# Patient Record
Sex: Male | Born: 1970 | Race: Black or African American | Hispanic: No | Smoking: Former smoker
Health system: Southern US, Community
[De-identification: ages and names within clinical notes are randomized; demographics above are authoritative.]

## PROBLEM LIST (undated history)

## (undated) DIAGNOSIS — G4730 Sleep apnea, unspecified: Secondary | ICD-10-CM

## (undated) DIAGNOSIS — Z860100 Personal history of colon polyps, unspecified: Secondary | ICD-10-CM

## (undated) DIAGNOSIS — K7689 Other specified diseases of liver: Secondary | ICD-10-CM

## (undated) DIAGNOSIS — T7840XA Allergy, unspecified, initial encounter: Secondary | ICD-10-CM

## (undated) DIAGNOSIS — E785 Hyperlipidemia, unspecified: Secondary | ICD-10-CM

## (undated) DIAGNOSIS — D649 Anemia, unspecified: Secondary | ICD-10-CM

## (undated) DIAGNOSIS — C801 Malignant (primary) neoplasm, unspecified: Secondary | ICD-10-CM

## (undated) DIAGNOSIS — K625 Hemorrhage of anus and rectum: Secondary | ICD-10-CM

## (undated) DIAGNOSIS — K219 Gastro-esophageal reflux disease without esophagitis: Secondary | ICD-10-CM

## (undated) DIAGNOSIS — Z9289 Personal history of other medical treatment: Secondary | ICD-10-CM

## (undated) DIAGNOSIS — G2581 Restless legs syndrome: Secondary | ICD-10-CM

## (undated) DIAGNOSIS — R7303 Prediabetes: Secondary | ICD-10-CM

## (undated) DIAGNOSIS — K449 Diaphragmatic hernia without obstruction or gangrene: Secondary | ICD-10-CM

## (undated) DIAGNOSIS — F419 Anxiety disorder, unspecified: Secondary | ICD-10-CM

## (undated) DIAGNOSIS — R0789 Other chest pain: Secondary | ICD-10-CM

## (undated) DIAGNOSIS — Z720 Tobacco use: Secondary | ICD-10-CM

## (undated) DIAGNOSIS — I10 Essential (primary) hypertension: Secondary | ICD-10-CM

## (undated) DIAGNOSIS — C9000 Multiple myeloma not having achieved remission: Secondary | ICD-10-CM

## (undated) DIAGNOSIS — F32A Depression, unspecified: Secondary | ICD-10-CM

## (undated) HISTORY — DX: Hemorrhage of anus and rectum: K62.5

## (undated) HISTORY — DX: Gastro-esophageal reflux disease without esophagitis: K21.9

## (undated) HISTORY — DX: Other chest pain: R07.89

## (undated) HISTORY — DX: Other specified diseases of liver: K76.89

## (undated) HISTORY — DX: Hyperlipidemia, unspecified: E78.5

## (undated) HISTORY — DX: Personal history of colon polyps, unspecified: Z86.0100

## (undated) HISTORY — DX: Personal history of colonic polyps: Z86.010

## (undated) HISTORY — DX: Allergy, unspecified, initial encounter: T78.40XA

## (undated) HISTORY — DX: Tobacco use: Z72.0

---

## 2001-01-17 ENCOUNTER — Emergency Department (HOSPITAL_COMMUNITY): Admission: EM | Admit: 2001-01-17 | Discharge: 2001-01-17 | Payer: Self-pay | Admitting: Emergency Medicine

## 2007-02-06 ENCOUNTER — Emergency Department (HOSPITAL_COMMUNITY): Admission: EM | Admit: 2007-02-06 | Discharge: 2007-02-06 | Payer: Self-pay | Admitting: Emergency Medicine

## 2007-07-21 ENCOUNTER — Emergency Department (HOSPITAL_COMMUNITY): Admission: EM | Admit: 2007-07-21 | Discharge: 2007-07-21 | Payer: Self-pay | Admitting: Emergency Medicine

## 2007-08-24 ENCOUNTER — Emergency Department (HOSPITAL_COMMUNITY): Admission: EM | Admit: 2007-08-24 | Discharge: 2007-08-24 | Payer: Self-pay | Admitting: Emergency Medicine

## 2007-09-16 ENCOUNTER — Inpatient Hospital Stay (HOSPITAL_COMMUNITY): Admission: EM | Admit: 2007-09-16 | Discharge: 2007-09-17 | Payer: Self-pay | Admitting: Emergency Medicine

## 2007-09-16 ENCOUNTER — Ambulatory Visit: Payer: Self-pay | Admitting: Internal Medicine

## 2007-09-17 ENCOUNTER — Ambulatory Visit: Payer: Self-pay | Admitting: Internal Medicine

## 2007-09-20 ENCOUNTER — Emergency Department (HOSPITAL_COMMUNITY): Admission: EM | Admit: 2007-09-20 | Discharge: 2007-09-20 | Payer: Self-pay | Admitting: Emergency Medicine

## 2007-09-28 ENCOUNTER — Emergency Department (HOSPITAL_COMMUNITY): Admission: EM | Admit: 2007-09-28 | Discharge: 2007-09-28 | Payer: Self-pay | Admitting: Emergency Medicine

## 2007-09-30 ENCOUNTER — Ambulatory Visit (HOSPITAL_COMMUNITY): Admission: RE | Admit: 2007-09-30 | Discharge: 2007-09-30 | Payer: Self-pay | Admitting: Internal Medicine

## 2007-09-30 ENCOUNTER — Encounter: Payer: Self-pay | Admitting: Internal Medicine

## 2007-09-30 ENCOUNTER — Ambulatory Visit: Payer: Self-pay | Admitting: Cardiology

## 2007-10-04 ENCOUNTER — Ambulatory Visit (HOSPITAL_COMMUNITY): Admission: RE | Admit: 2007-10-04 | Discharge: 2007-10-04 | Payer: Self-pay | Admitting: Internal Medicine

## 2007-10-04 ENCOUNTER — Ambulatory Visit: Payer: Self-pay | Admitting: Internal Medicine

## 2007-10-04 HISTORY — PX: ESOPHAGOGASTRODUODENOSCOPY: SHX1529

## 2007-11-01 ENCOUNTER — Ambulatory Visit: Payer: Self-pay | Admitting: Internal Medicine

## 2007-12-19 ENCOUNTER — Emergency Department (HOSPITAL_COMMUNITY): Admission: EM | Admit: 2007-12-19 | Discharge: 2007-12-19 | Payer: Self-pay | Admitting: Emergency Medicine

## 2008-03-23 ENCOUNTER — Emergency Department (HOSPITAL_COMMUNITY): Admission: EM | Admit: 2008-03-23 | Discharge: 2008-03-23 | Payer: Self-pay | Admitting: Emergency Medicine

## 2008-06-09 ENCOUNTER — Emergency Department (HOSPITAL_COMMUNITY): Admission: EM | Admit: 2008-06-09 | Discharge: 2008-06-09 | Payer: Self-pay | Admitting: Emergency Medicine

## 2008-06-22 ENCOUNTER — Observation Stay (HOSPITAL_COMMUNITY): Admission: EM | Admit: 2008-06-22 | Discharge: 2008-06-23 | Payer: Self-pay | Admitting: Emergency Medicine

## 2008-06-23 ENCOUNTER — Ambulatory Visit: Payer: Self-pay | Admitting: Internal Medicine

## 2008-07-20 ENCOUNTER — Ambulatory Visit: Payer: Self-pay | Admitting: Internal Medicine

## 2008-07-25 ENCOUNTER — Ambulatory Visit (HOSPITAL_COMMUNITY): Admission: RE | Admit: 2008-07-25 | Discharge: 2008-07-25 | Payer: Self-pay | Admitting: Internal Medicine

## 2008-07-28 ENCOUNTER — Encounter: Payer: Self-pay | Admitting: Internal Medicine

## 2008-08-02 ENCOUNTER — Emergency Department (HOSPITAL_COMMUNITY): Admission: EM | Admit: 2008-08-02 | Discharge: 2008-08-02 | Payer: Self-pay | Admitting: Emergency Medicine

## 2008-08-23 ENCOUNTER — Telehealth (INDEPENDENT_AMBULATORY_CARE_PROVIDER_SITE_OTHER): Payer: Self-pay

## 2008-09-06 DIAGNOSIS — Z720 Tobacco use: Secondary | ICD-10-CM | POA: Insufficient documentation

## 2008-09-06 DIAGNOSIS — F1010 Alcohol abuse, uncomplicated: Secondary | ICD-10-CM | POA: Insufficient documentation

## 2008-09-06 DIAGNOSIS — K5909 Other constipation: Secondary | ICD-10-CM | POA: Insufficient documentation

## 2008-09-06 DIAGNOSIS — R1310 Dysphagia, unspecified: Secondary | ICD-10-CM | POA: Insufficient documentation

## 2008-09-06 DIAGNOSIS — F17200 Nicotine dependence, unspecified, uncomplicated: Secondary | ICD-10-CM | POA: Insufficient documentation

## 2008-09-06 DIAGNOSIS — K219 Gastro-esophageal reflux disease without esophagitis: Secondary | ICD-10-CM | POA: Insufficient documentation

## 2008-09-06 DIAGNOSIS — I10 Essential (primary) hypertension: Secondary | ICD-10-CM | POA: Insufficient documentation

## 2008-09-25 ENCOUNTER — Emergency Department (HOSPITAL_COMMUNITY): Admission: EM | Admit: 2008-09-25 | Discharge: 2008-09-25 | Payer: Self-pay | Admitting: Emergency Medicine

## 2008-10-13 ENCOUNTER — Telehealth (INDEPENDENT_AMBULATORY_CARE_PROVIDER_SITE_OTHER): Payer: Self-pay | Admitting: *Deleted

## 2008-11-03 ENCOUNTER — Telehealth (INDEPENDENT_AMBULATORY_CARE_PROVIDER_SITE_OTHER): Payer: Self-pay

## 2009-01-02 ENCOUNTER — Ambulatory Visit: Payer: Self-pay | Admitting: Internal Medicine

## 2009-01-10 ENCOUNTER — Ambulatory Visit: Payer: Self-pay | Admitting: Internal Medicine

## 2009-01-10 ENCOUNTER — Ambulatory Visit (HOSPITAL_COMMUNITY): Admission: RE | Admit: 2009-01-10 | Discharge: 2009-01-10 | Payer: Self-pay | Admitting: Internal Medicine

## 2009-01-10 HISTORY — PX: COLONOSCOPY: SHX174

## 2009-01-22 ENCOUNTER — Telehealth (INDEPENDENT_AMBULATORY_CARE_PROVIDER_SITE_OTHER): Payer: Self-pay

## 2009-03-06 ENCOUNTER — Emergency Department (HOSPITAL_COMMUNITY): Admission: EM | Admit: 2009-03-06 | Discharge: 2009-03-06 | Payer: Self-pay | Admitting: Emergency Medicine

## 2009-05-31 ENCOUNTER — Telehealth (INDEPENDENT_AMBULATORY_CARE_PROVIDER_SITE_OTHER): Payer: Self-pay | Admitting: *Deleted

## 2009-06-18 ENCOUNTER — Emergency Department (HOSPITAL_COMMUNITY): Admission: EM | Admit: 2009-06-18 | Discharge: 2009-06-18 | Payer: Self-pay | Admitting: Emergency Medicine

## 2009-07-18 ENCOUNTER — Telehealth (INDEPENDENT_AMBULATORY_CARE_PROVIDER_SITE_OTHER): Payer: Self-pay | Admitting: *Deleted

## 2009-08-28 ENCOUNTER — Telehealth (INDEPENDENT_AMBULATORY_CARE_PROVIDER_SITE_OTHER): Payer: Self-pay

## 2009-09-18 ENCOUNTER — Emergency Department (HOSPITAL_COMMUNITY): Admission: EM | Admit: 2009-09-18 | Discharge: 2009-09-18 | Payer: Self-pay | Admitting: Emergency Medicine

## 2009-10-09 ENCOUNTER — Telehealth (INDEPENDENT_AMBULATORY_CARE_PROVIDER_SITE_OTHER): Payer: Self-pay

## 2010-06-25 NOTE — Progress Notes (Signed)
Summary: samples of Nexium  Phone Note Call from Patient   Reason for Call: Refill Medication Summary of Call: Pt came by office to see if he could get Nexium samples. He was told last week that we were out and to check back later. I gave him 5 boxes of 5.  Initial call taken by: Diana Eves,  July 18, 2009 10:35 AM

## 2010-06-25 NOTE — Progress Notes (Signed)
Summary: Nexium samples  Phone Note Call from Patient   Caller: Patient Summary of Call: Pt requested samples of Nexium. # 30 put at front for the pt.   Initial call taken by: Cloria Spring LPN,  August 28, 2009 2:31 PM

## 2010-06-25 NOTE — Progress Notes (Signed)
Summary: samples  Phone Note Call from Patient   Reason for Call: Refill Medication Summary of Call: Pt called to see if he could get Nexium samples. Samule Dry for me to give 6 boxes of 5 of Nexium 40mg . Initial call taken by: Diana Eves,  May 31, 2009 3:40 PM

## 2010-06-25 NOTE — Progress Notes (Signed)
Summary: nexium samples  Phone Note Call from Patient Call back at [facility] Phone [phone]   Caller: Patient Summary of Call: pt called requesting samples of nexium. #15 given Initial call taken by: Hendricks Limes LPN,  Oct 09, 2009 9:13 AM

## 2010-08-10 ENCOUNTER — Emergency Department (HOSPITAL_COMMUNITY): Payer: Self-pay

## 2010-08-10 ENCOUNTER — Emergency Department (HOSPITAL_COMMUNITY)
Admission: EM | Admit: 2010-08-10 | Discharge: 2010-08-10 | Disposition: A | Discharge status: Home / Self Care | Payer: Self-pay | Attending: Emergency Medicine | Admitting: Emergency Medicine

## 2010-08-10 DIAGNOSIS — R059 Cough, unspecified: Secondary | ICD-10-CM | POA: Insufficient documentation

## 2010-08-10 DIAGNOSIS — I10 Essential (primary) hypertension: Secondary | ICD-10-CM | POA: Insufficient documentation

## 2010-08-10 DIAGNOSIS — K219 Gastro-esophageal reflux disease without esophagitis: Secondary | ICD-10-CM | POA: Insufficient documentation

## 2010-08-10 DIAGNOSIS — R05 Cough: Secondary | ICD-10-CM | POA: Insufficient documentation

## 2010-08-10 DIAGNOSIS — R51 Headache: Secondary | ICD-10-CM | POA: Insufficient documentation

## 2010-08-10 DIAGNOSIS — J329 Chronic sinusitis, unspecified: Secondary | ICD-10-CM | POA: Insufficient documentation

## 2010-08-10 DIAGNOSIS — R42 Dizziness and giddiness: Secondary | ICD-10-CM | POA: Insufficient documentation

## 2010-08-10 LAB — DIFFERENTIAL
Basophils Absolute: 0 10*3/uL (ref 0.0–0.1)
Basophils Relative: 0 % (ref 0–1)
Eosinophils Absolute: 0.1 10*3/uL (ref 0.0–0.7)
Eosinophils Relative: 1 % (ref 0–5)
Lymphocytes Relative: 38 % (ref 12–46)
Lymphs Abs: 3.3 10*3/uL (ref 0.7–4.0)
Monocytes Absolute: 0.4 10*3/uL (ref 0.1–1.0)
Monocytes Relative: 5 % (ref 3–12)
Neutro Abs: 4.8 10*3/uL (ref 1.7–7.7)
Neutrophils Relative %: 56 % (ref 43–77)

## 2010-08-10 LAB — BASIC METABOLIC PANEL
BUN: 16 mg/dL (ref 6–23)
CO2: 24 mEq/L (ref 19–32)
Calcium: 9 mg/dL (ref 8.4–10.5)
Chloride: 102 mEq/L (ref 96–112)
Creatinine, Ser: 1.03 mg/dL (ref 0.4–1.5)
GFR calc Af Amer: >60 mL/min (ref >60)
GFR calc non Af Amer: >60 mL/min (ref >60)
Glucose, Bld: 100 mg/dL — ABNORMAL HIGH (ref 70–99)
Potassium: 4.4 mEq/L (ref 3.5–5.1)
Sodium: 134 mEq/L — ABNORMAL LOW (ref 135–145)

## 2010-08-10 LAB — CBC
HCT: 45.5 % (ref 39.0–52.0)
Hemoglobin: 15.2 g/dL (ref 13.0–17.0)
MCH: 28.3 pg (ref 26.0–34.0)
MCHC: 33.4 g/dL (ref 30.0–36.0)
MCV: 84.6 fL (ref 78.0–100.0)
Platelets: 265 10*3/uL (ref 150–400)
RBC: 5.38 MIL/uL (ref 4.22–5.81)
RDW: 13.1 % (ref 11.5–15.5)
WBC: 8.6 10*3/uL (ref 4.0–10.5)

## 2010-08-11 LAB — RAPID STREP SCREEN (MED CTR MEBANE ONLY): Streptococcus, Group A Screen (Direct): NEGATIVE

## 2010-08-13 LAB — CBC
HCT: 41.9 % (ref 39.0–52.0)
Hemoglobin: 14.7 g/dL (ref 13.0–17.0)
MCHC: 35.1 g/dL (ref 30.0–36.0)
MCV: 81.8 fL (ref 78.0–100.0)
Platelets: 267 10*3/uL (ref 150–400)
RBC: 5.12 MIL/uL (ref 4.22–5.81)
RDW: 13 % (ref 11.5–15.5)
WBC: 8 10*3/uL (ref 4.0–10.5)

## 2010-08-13 LAB — POCT CARDIAC MARKERS
CKMB, poc: 2.4 ng/mL (ref 1.0–8.0)
Myoglobin, poc: 94.6 ng/mL (ref 12–200)
Troponin i, poc: <0.05 ng/mL (ref 0.00–0.09)

## 2010-08-13 LAB — DIFFERENTIAL
Basophils Absolute: 0 10*3/uL (ref 0.0–0.1)
Basophils Relative: 1 % (ref 0–1)
Eosinophils Absolute: 0.1 10*3/uL (ref 0.0–0.7)
Eosinophils Relative: 1 % (ref 0–5)
Lymphocytes Relative: 39 % (ref 12–46)
Lymphs Abs: 3.1 10*3/uL (ref 0.7–4.0)
Monocytes Absolute: 0.5 10*3/uL (ref 0.1–1.0)
Monocytes Relative: 6 % (ref 3–12)
Neutro Abs: 4.3 10*3/uL (ref 1.7–7.7)
Neutrophils Relative %: 54 % (ref 43–77)

## 2010-08-13 LAB — BASIC METABOLIC PANEL
BUN: 20 mg/dL (ref 6–23)
CO2: 25 mEq/L (ref 19–32)
Calcium: 9.6 mg/dL (ref 8.4–10.5)
Chloride: 103 mEq/L (ref 96–112)
Creatinine, Ser: 1.23 mg/dL (ref 0.4–1.5)
GFR calc Af Amer: >60 mL/min (ref >60)
GFR calc non Af Amer: >60 mL/min (ref >60)
Glucose, Bld: 106 mg/dL — ABNORMAL HIGH (ref 70–99)
Potassium: 4.2 mEq/L (ref 3.5–5.1)
Sodium: 135 mEq/L (ref 135–145)

## 2010-08-29 LAB — COMPREHENSIVE METABOLIC PANEL
ALT: 24 U/L (ref 0–53)
AST: 21 U/L (ref 0–37)
Albumin: 4.2 g/dL (ref 3.5–5.2)
Alkaline Phosphatase: 62 U/L (ref 39–117)
BUN: 21 mg/dL (ref 6–23)
CO2: 24 mEq/L (ref 19–32)
Calcium: 9.3 mg/dL (ref 8.4–10.5)
Chloride: 105 mEq/L (ref 96–112)
Creatinine, Ser: 1.46 mg/dL (ref 0.4–1.5)
GFR calc Af Amer: >60 mL/min (ref >60)
GFR calc non Af Amer: 54 mL/min — ABNORMAL LOW (ref >60)
Glucose, Bld: 111 mg/dL — ABNORMAL HIGH (ref 70–99)
Potassium: 4.1 mEq/L (ref 3.5–5.1)
Sodium: 137 mEq/L (ref 135–145)
Total Bilirubin: 0.7 mg/dL (ref 0.3–1.2)
Total Protein: 7.6 g/dL (ref 6.0–8.3)

## 2010-08-29 LAB — DIFFERENTIAL
Basophils Absolute: 0.1 10*3/uL (ref 0.0–0.1)
Basophils Relative: 1 % (ref 0–1)
Eosinophils Absolute: 0.1 10*3/uL (ref 0.0–0.7)
Eosinophils Relative: 2 % (ref 0–5)
Lymphocytes Relative: 23 % (ref 12–46)
Lymphs Abs: 2.2 10*3/uL (ref 0.7–4.0)
Monocytes Absolute: 0.5 10*3/uL (ref 0.1–1.0)
Monocytes Relative: 5 % (ref 3–12)
Neutro Abs: 6.4 10*3/uL (ref 1.7–7.7)
Neutrophils Relative %: 69 % (ref 43–77)

## 2010-08-29 LAB — POCT CARDIAC MARKERS
CKMB, poc: 1.7 ng/mL (ref 1.0–8.0)
Myoglobin, poc: 80.4 ng/mL (ref 12–200)
Troponin i, poc: <0.05 ng/mL (ref 0.00–0.09)

## 2010-08-29 LAB — CBC
HCT: 46.3 % (ref 39.0–52.0)
Hemoglobin: 15.9 g/dL (ref 13.0–17.0)
MCHC: 34.3 g/dL (ref 30.0–36.0)
MCV: 85.1 fL (ref 78.0–100.0)
Platelets: 269 10*3/uL (ref 150–400)
RBC: 5.44 MIL/uL (ref 4.22–5.81)
RDW: 12.9 % (ref 11.5–15.5)
WBC: 9.3 10*3/uL (ref 4.0–10.5)

## 2010-09-09 LAB — DIFFERENTIAL
Basophils Absolute: 0 10*3/uL (ref 0.0–0.1)
Basophils Absolute: 0.1 10*3/uL (ref 0.0–0.1)
Basophils Relative: 0 % (ref 0–1)
Basophils Relative: 1 % (ref 0–1)
Eosinophils Absolute: 0.1 10*3/uL (ref 0.0–0.7)
Eosinophils Absolute: 0.2 10*3/uL (ref 0.0–0.7)
Eosinophils Relative: 2 % (ref 0–5)
Eosinophils Relative: 2 % (ref 0–5)
Lymphocytes Relative: 32 % (ref 12–46)
Lymphocytes Relative: 40 % (ref 12–46)
Lymphs Abs: 2.8 10*3/uL (ref 0.7–4.0)
Lymphs Abs: 3.1 10*3/uL (ref 0.7–4.0)
Monocytes Absolute: 0.5 10*3/uL (ref 0.1–1.0)
Monocytes Absolute: 0.6 10*3/uL (ref 0.1–1.0)
Monocytes Relative: 6 % (ref 3–12)
Monocytes Relative: 7 % (ref 3–12)
Neutro Abs: 4.1 10*3/uL (ref 1.7–7.7)
Neutro Abs: 5.2 10*3/uL (ref 1.7–7.7)
Neutrophils Relative %: 51 % (ref 43–77)
Neutrophils Relative %: 59 % (ref 43–77)

## 2010-09-09 LAB — CBC
HCT: 43.6 % (ref 39.0–52.0)
HCT: 45.9 % (ref 39.0–52.0)
Hemoglobin: 14.6 g/dL (ref 13.0–17.0)
Hemoglobin: 15.9 g/dL (ref 13.0–17.0)
MCHC: 33.5 g/dL (ref 30.0–36.0)
MCHC: 34.6 g/dL (ref 30.0–36.0)
MCV: 85.1 fL (ref 78.0–100.0)
MCV: 85.8 fL (ref 78.0–100.0)
Platelets: 276 10*3/uL (ref 150–400)
Platelets: 291 10*3/uL (ref 150–400)
RBC: 5.08 MIL/uL (ref 4.22–5.81)
RBC: 5.4 MIL/uL (ref 4.22–5.81)
RDW: 12.6 % (ref 11.5–15.5)
RDW: 12.6 % (ref 11.5–15.5)
WBC: 8 10*3/uL (ref 4.0–10.5)
WBC: 8.8 10*3/uL (ref 4.0–10.5)

## 2010-09-09 LAB — CARDIAC PANEL(CRET KIN+CKTOT+MB+TROPI)
CK, MB: 1.3 ng/mL (ref 0.3–4.0)
CK, MB: 1.3 ng/mL (ref 0.3–4.0)
Relative Index: 0.8 (ref 0.0–2.5)
Relative Index: 0.8 (ref 0.0–2.5)
Total CK: 155 U/L (ref 7–232)
Total CK: 165 U/L (ref 7–232)
Troponin I: 0.02 ng/mL (ref 0.00–0.06)
Troponin I: <0.01 ng/mL (ref 0.00–0.06)

## 2010-09-09 LAB — BASIC METABOLIC PANEL
BUN: 12 mg/dL (ref 6–23)
BUN: 9 mg/dL (ref 6–23)
CO2: 26 mEq/L (ref 19–32)
CO2: 29 mEq/L (ref 19–32)
Calcium: 9.2 mg/dL (ref 8.4–10.5)
Calcium: 9.5 mg/dL (ref 8.4–10.5)
Chloride: 101 mEq/L (ref 96–112)
Chloride: 99 mEq/L (ref 96–112)
Creatinine, Ser: 1.08 mg/dL (ref 0.4–1.5)
Creatinine, Ser: 1.23 mg/dL (ref 0.4–1.5)
GFR calc Af Amer: >60 mL/min (ref >60)
GFR calc Af Amer: >60 mL/min (ref >60)
GFR calc non Af Amer: >60 mL/min (ref >60)
GFR calc non Af Amer: >60 mL/min (ref >60)
Glucose, Bld: 103 mg/dL — ABNORMAL HIGH (ref 70–99)
Glucose, Bld: 110 mg/dL — ABNORMAL HIGH (ref 70–99)
Potassium: 3.9 mEq/L (ref 3.5–5.1)
Potassium: 4 mEq/L (ref 3.5–5.1)
Sodium: 136 mEq/L (ref 135–145)
Sodium: 136 mEq/L (ref 135–145)

## 2010-09-09 LAB — LIPID PANEL
Cholesterol: 220 mg/dL — ABNORMAL HIGH (ref 0–200)
HDL: 26 mg/dL — ABNORMAL LOW (ref >39)
LDL Cholesterol: 163 mg/dL — ABNORMAL HIGH (ref 0–99)
Total CHOL/HDL Ratio: 8.5 RATIO
Triglycerides: 155 mg/dL — ABNORMAL HIGH (ref <150)
VLDL: 31 mg/dL (ref 0–40)

## 2010-09-09 LAB — HEPATIC FUNCTION PANEL
ALT: 19 U/L (ref 0–53)
AST: 19 U/L (ref 0–37)
Albumin: 3.6 g/dL (ref 3.5–5.2)
Alkaline Phosphatase: 57 U/L (ref 39–117)
Bilirubin, Direct: 0.1 mg/dL (ref 0.0–0.3)
Indirect Bilirubin: 0.6 mg/dL (ref 0.3–0.9)
Total Bilirubin: 0.7 mg/dL (ref 0.3–1.2)
Total Protein: 6.8 g/dL (ref 6.0–8.3)

## 2010-09-09 LAB — POCT CARDIAC MARKERS
CKMB, poc: 1.1 ng/mL (ref 1.0–8.0)
Myoglobin, poc: 59.2 ng/mL (ref 12–200)
Troponin i, poc: <0.05 ng/mL (ref 0.00–0.09)

## 2010-09-09 LAB — TSH: TSH: 2.559 u[IU]/mL (ref 0.350–4.500)

## 2010-09-09 LAB — LIPASE, BLOOD: Lipase: 30 U/L (ref 11–59)

## 2010-09-28 ENCOUNTER — Emergency Department (HOSPITAL_COMMUNITY)
Admission: EM | Admit: 2010-09-28 | Discharge: 2010-09-28 | Disposition: A | Discharge status: Home / Self Care | Payer: Self-pay | Attending: Emergency Medicine | Admitting: Emergency Medicine

## 2010-09-28 DIAGNOSIS — R070 Pain in throat: Secondary | ICD-10-CM | POA: Insufficient documentation

## 2010-09-28 DIAGNOSIS — J069 Acute upper respiratory infection, unspecified: Secondary | ICD-10-CM | POA: Insufficient documentation

## 2010-09-28 DIAGNOSIS — K449 Diaphragmatic hernia without obstruction or gangrene: Secondary | ICD-10-CM | POA: Insufficient documentation

## 2010-09-28 DIAGNOSIS — K219 Gastro-esophageal reflux disease without esophagitis: Secondary | ICD-10-CM | POA: Insufficient documentation

## 2010-09-28 DIAGNOSIS — I10 Essential (primary) hypertension: Secondary | ICD-10-CM | POA: Insufficient documentation

## 2010-09-28 DIAGNOSIS — R51 Headache: Secondary | ICD-10-CM | POA: Insufficient documentation

## 2010-09-30 LAB — GC/CHLAMYDIA PROBE AMP, GENITAL
Chlamydia, DNA Probe: NEGATIVE
GC Probe Amp, Genital: NEGATIVE

## 2010-10-02 ENCOUNTER — Emergency Department (HOSPITAL_COMMUNITY): Payer: Self-pay

## 2010-10-02 ENCOUNTER — Emergency Department (HOSPITAL_COMMUNITY)
Admission: EM | Admit: 2010-10-02 | Discharge: 2010-10-02 | Disposition: A | Discharge status: Home / Self Care | Payer: Self-pay | Attending: Emergency Medicine | Admitting: Emergency Medicine

## 2010-10-02 DIAGNOSIS — K219 Gastro-esophageal reflux disease without esophagitis: Secondary | ICD-10-CM | POA: Insufficient documentation

## 2010-10-02 DIAGNOSIS — J40 Bronchitis, not specified as acute or chronic: Secondary | ICD-10-CM | POA: Insufficient documentation

## 2010-10-02 DIAGNOSIS — I10 Essential (primary) hypertension: Secondary | ICD-10-CM | POA: Insufficient documentation

## 2010-10-02 DIAGNOSIS — K449 Diaphragmatic hernia without obstruction or gangrene: Secondary | ICD-10-CM | POA: Insufficient documentation

## 2010-10-02 LAB — CBC
HCT: 45 % (ref 39.0–52.0)
Hemoglobin: 14.4 g/dL (ref 13.0–17.0)
MCH: 27.6 pg (ref 26.0–34.0)
MCHC: 32 g/dL (ref 30.0–36.0)
MCV: 86.2 fL (ref 78.0–100.0)
Platelets: 219 10*3/uL (ref 150–400)
RBC: 5.22 MIL/uL (ref 4.22–5.81)
RDW: 13 % (ref 11.5–15.5)
WBC: 10.6 10*3/uL — ABNORMAL HIGH (ref 4.0–10.5)

## 2010-10-02 LAB — DIFFERENTIAL
Band Neutrophils: 0 % (ref 0–10)
Basophils Absolute: 0 10*3/uL (ref 0.0–0.1)
Basophils Relative: 0 % (ref 0–1)
Blasts: 0 %
Eosinophils Absolute: 0 10*3/uL (ref 0.0–0.7)
Eosinophils Relative: 0 % (ref 0–5)
Lymphocytes Relative: 40 % (ref 12–46)
Lymphs Abs: 4.2 10*3/uL — ABNORMAL HIGH (ref 0.7–4.0)
Metamyelocytes Relative: 0 %
Monocytes Absolute: 0.5 10*3/uL (ref 0.1–1.0)
Monocytes Relative: 5 % (ref 3–12)
Myelocytes: 0 %
Neutro Abs: 5.9 10*3/uL (ref 1.7–7.7)
Neutrophils Relative %: 55 % (ref 43–77)
Promyelocytes Absolute: 0 %
nRBC: 0 /100 WBC

## 2010-10-02 LAB — BASIC METABOLIC PANEL
BUN: 21 mg/dL (ref 6–23)
CO2: 24 mEq/L (ref 19–32)
Calcium: 9.8 mg/dL (ref 8.4–10.5)
Chloride: 103 mEq/L (ref 96–112)
Creatinine, Ser: 1.19 mg/dL (ref 0.4–1.5)
GFR calc Af Amer: >60 mL/min (ref >60)
GFR calc non Af Amer: >60 mL/min (ref >60)
Glucose, Bld: 95 mg/dL (ref 70–99)
Potassium: 4.3 mEq/L (ref 3.5–5.1)
Sodium: 136 mEq/L (ref 135–145)

## 2010-10-02 LAB — POCT CARDIAC MARKERS
CKMB, poc: 4.3 ng/mL (ref 1.0–8.0)
Myoglobin, poc: 169 ng/mL (ref 12–200)
Troponin i, poc: <0.05 ng/mL (ref 0.00–0.09)

## 2010-10-08 NOTE — Discharge Summary (Signed)
[name], [name]               ACCOUNT NO.:  [id number]   MEDICAL RECORD NO.:  [id number]          PATIENT TYPE:  OBV   LOCATION:  A308                          FACILITY:  [facility]   PHYSICIAN:  Osvaldo Shipper, MD     DATE OF BIRTH:  [birth year]   DATE OF ADMISSION:  06/22/2008  DATE OF DISCHARGE:  01/29/2010LH                               DISCHARGE SUMMARY   Please review H and P dictated by Dr. Lilian Kapur for details regarding the  patient's presenting illness.   The patient is planning to follow up with Dr. Renard Matter.  He currently is  [facility] by [facility].   DISCHARGE DIAGNOSES:  1. Gastroesophageal reflux disease.  2. Hypertension.   BRIEF HOSPITAL COURSE:  Briefly, this is a 40 year old African American  male who presented to the ED with complaints of chest pain.  It was  found that he actually had epigastric pain.  The patient has been worked  up for chest pain in the past with a negative stress echo in May 2009.  The patient subsequently was followed up by Gastroenterology, and he  underwent EGD which showed evidence for reflux esophagitis.  The patient  was asked to be on Protonix which she obviously is not taking at this  time.  The patient was seen by GI today and they have arranged for him  to get samples at their office.  He is feeling better.  He has tolerated  his breakfast.  His pain is improved.  We are awaiting LFTs and lipase  level to come back from the lab, and if they are normal he can go home.  He will continue with his antihypertensive agents.  He will follow up  with Dr. Renard Matter and with GI in 2 weeks.   DISCHARGE MEDICATIONS:  Lisinopril/hydrochlorothiazide 10/12.5 one  tablet daily.  He is on penicillin VK for a dental procedure which he  can continue taking as before and he will be on Nexium 20 mg daily.   Follow up with GI in 2 weeks.   TOTAL TIME OF THIS DISCHARGE ENCOUNTER:  Less than 30 minutes.   Imaging studies include a chest  x-ray which was negative.      Osvaldo Shipper, MD  Electronically Signed     GK/MEDQ  D:  06/23/2008  T:  06/23/2008  Job:  [id number]   cc:   R. Roetta Sessions, M.D.  [address]  [place]  [place] [address]

## 2010-10-08 NOTE — Assessment & Plan Note (Signed)
NAMEKEVONTE, VANECEK                CHART#:  [id number]   DATE:  11/01/2007                       DOB:  05-08-1971   CHIEF COMPLAINT:  1. Follow up for erosive reflux esophagitis/bulbar erosions on      esophagogastroduodenoscopy by Dr. Jena Gauss, Oct 04, 2007.  2. Status post Helicobacter pylori treatment for positive serum      titers.  3. Atypical chest pain and nausea secondary to erosive reflux      esophagitis/bulbar erosions.  4. Obesity.   SUBJECTIVE:  Mr. Nofziger is a 40 year old African American male who is  status post H. pylori treatment with Prevpac with history of positive  serology and EGD which showed erosive reflux esophagitis and hiatal  hernia and bulbar erosions.  He was also dilated with 56-French Bartholome Bill.  He has completed his Prevpac course.  He continues to have  transient nausea and some heartburn, indigestion associated with certain  foods, but has been off PPI since he completed the Prevpac.  Unfortunately, he misunderstood our directions to continue this once he  was done with the Prevpac.  He is having normal, soft, brown bowel  movement.  He denies any abdominal pain at this time.  His appetite is  good.   CURRENT MEDICATIONS:  See the list from November 01, 2007.   ALLERGIES:  No known drug allergies.   OBJECTIVE:  VITAL SIGNS:  Weight 240 pounds, height 69 inches, temp  98.8, blood pressure 130/90, and pulse 88.  GENERAL:  Mr. Maltese is an obese African American male who is alert,  oriented, pleasant, cooperative, and in no acute distress.  HEENT:  Sclerae clear.  Nonicteric.  Conjunctivae pink.  Oropharynx pink  and moist without any lesions.  CHEST:  Heart is regular rate and rhythm.  Normal S1 and S2.  ABDOMEN:  Protuberant.  Positive bowel sounds x4.  No bruits  auscultated.  Soft, nontender, and nondistended without mass or  hepatosplenomegaly.  No rebound  tenderness or guarding.  EXTREMITIES:  Without clubbing or edema bilaterally.   ASSESSMENT:  Erosive reflux esophagitis/bulbar duodenitis.  The patient  has been off of PPI and is symptomatic.  Unfortunately, he misunderstood  instructions as far as resuming his medication after Prevpac treatment  for H. pylori.   He also has occasional episodes of constipation for which he uses  MiraLax at home with good relief.   PLAN:  1. Continue MiraLax 17 g daily p.r.n.  2. Protonix 40 mg b.i.d. for 1 more month, then can decrease to 40 mg      daily, #60 with 10 refills.  3. Office visit in 1 year or sooner with Dr. Jena Gauss.       Lorenza Burton, N.P.  Electronically Signed     R. Roetta Sessions, M.D.  Electronically Signed    KJ/MEDQ  D:  11/01/2007  T:  11/02/2007  Job:  [id number]

## 2010-10-08 NOTE — Procedures (Signed)
NAMECEASER, EBELING NO.:  [id number]   MEDICAL RECORD NO.:  [id number]          PATIENT TYPE:  OUT   LOCATION:  RAD                           FACILITY:  [facility]   PHYSICIAN:  Gerrit Friends. Dietrich Pates, MD, FACCDATE OF BIRTH:  1970/08/08   DATE OF PROCEDURE:  DATE OF DISCHARGE:  09/30/2007                                ECHOCARDIOGRAM   REFERRING PHYSICIAN:  Angus G. Renard Matter, MD   CLINICAL DATA:  A 40 year old woman with chest pain.  1. Treadmill exercise performed to a workload of 10 METS and a heart      rate of 156, 86% of age-predicted maximum.  Exercise discontinued      due to fatigue; no chest pain reported.  2. Blood pressure increased from a resting value of 135/85 to 190/95      during exercise, a normal response.  No arrhythmias noted.  3. Resting EKG:  Normal sinus rhythm; within normal limits.  4. Stress EKG:  Interpretation somewhat impaired by baseline artifact;      no significant ST-segment depression identified.  5. Resting echocardiogram:  Normal left ventricular size; mild      concentric LVH; normal regional and global LV systolic function.  6. Post-exercise echocardiogram:  Hyperdynamic function in all      myocardial segments.   IMPRESSION:  Negative stress echocardiogram revealing adequate exercise  capacity and neither electrocardiographic nor echocardiographic evidence  for myocardial ischemia or infarction.  Other findings as noted.      Gerrit Friends. Dietrich Pates, MD, [facility]  Electronically Signed     RMR/MEDQ  D:  10/02/2007  T:  10/03/2007  Job:  [id number]

## 2010-10-08 NOTE — Op Note (Signed)
[name], [name]               ACCOUNT NO.:  [id number]   MEDICAL RECORD NO.:  [id number]          PATIENT TYPE:  AMB   LOCATION:  DAY                           FACILITY:  [facility]   PHYSICIAN:  R. Roetta Sessions, M.D. DATE OF BIRTH:  [birth year]   DATE OF PROCEDURE:  10/04/2007  DATE OF DISCHARGE:                               OPERATIVE REPORT   PROCEDURE:  Esophagogastroduodenoscopy with Elease Hashimoto dilation.   INDICATIONS FOR PROCEDURE:  A 40 year old gentleman with a recent  atypical chest pain and some nausea.  Cardiology workup reportedly is  negative.  He has had some chronic reflux symptoms.  He has some  intermittent solid food dysphagia.  He has been on Protonix 40 mg orally  daily with some improvement in his symptoms.  EGD is now being done.  Potential for esophageal dilation reviewed.  Risks, benefits, and  alternatives have been also reviewed.  Please see documentation of  medical record.   PROCEDURE NOTE:  O2 saturation, blood pressure, pulse, and respirations  were monitored throughout the entire procedure.  Conscious sedation,  Versed 5 mg IV and Demerol 125 mg IV in divided doses.  Instrument,  Pentax video chip system.   FINDINGS:  Examination of tubular esophagus revealed two 2-cm linear  distal esophageal erosions.  There was no evidence of neoplasia, web,  stricture, ring, or Barrett's esophagus.  EG junction was easily  traversed.  Stomach:  Gastric cavity was emptied and insufflated well  with air.  Thorough examination of the gastric mucosa including  retroflexed view of the proximal stomach and esophagogastric junction  demonstrated a small hiatal hernia only.  The pylorus was patent, easily  traversed.  Examination of the bulb and second portion revealed bulbar  erosions.  There was no frank ulcer or infiltrating process.  Otherwise,  D1 and D2 appeared unremarkable.  Therapeutic/diagnostic maneuvers  performed.  The scope was withdrawn.  A 56-French Maloney  dilator was  passed to full insertion.  A look back revealed no apparent  complications related to passage of dilator.  The patient tolerated the  procedure well was reacted in the endoscopy.   IMPRESSION:  Distal esophageal erosion consistent with erosive reflux  esophagitis, patulous gastroesophageal junction status post passage of a  Maloney dilator, 56 [place].  Otherwise, unremarkable esophagus.  Hiatal  hernia.  Otherwise normal stomach.  Pain in  pylorus.  Bulbar erosion.  Otherwise, D1 and D2 appeared normal.   The patient has gastroesophageal reflux disease.   RECOMMENDATIONS:  1. Increase Protonix to 40 mg orally twice daily, i.e., before      breakfast and supper and antireflux literature      provided to [name].  2. Obtain H. pylori serologies.  3. Followup appointment with [place] in 1 to 2 months.      Jonathon Bellows, M.D.  Electronically Signed     RMR/MEDQ  D:  10/04/2007  T:  10/05/2007  Job:  [id number]   cc:   Barbaraann Barthel, M.D.  Fax: [phone]

## 2010-10-08 NOTE — Consult Note (Signed)
[name], [name]               ACCOUNT NO.:  [id number]   MEDICAL RECORD NO.:  [id number]          PATIENT TYPE:  OBV   LOCATION:  A308                          FACILITY:  [facility]   PHYSICIAN:  R. Roetta Sessions, M.D. DATE OF BIRTH:  1971-01-18   DATE OF CONSULTATION:  06/23/2008  DATE OF DISCHARGE:                                 CONSULTATION   REASON FOR CONSULTATION:  Gastroesophageal reflux disease.   HISTORY OF PRESENT ILLNESS:  Patient is a 40 year old African American  gentleman with a history of gastroesophageal reflux disease who was  admitted to the hospital yesterday with complaints of epigastric pain  which radiates into his chest.  He was admitted to observation for rule-  out MI.  Patient has a history of chronic GERD.  He was hospitalized  last year and underwent an EGD in May, 2009 and was found to have distal  esophageal erosions.  A 56 French Maloney dilator was passed, given a  history of dysphagia.  He also had bulbar erosions.  H. pylori  serologies were positive, and he was treated.  Unfortunately, patient  tells me that he has not been able to afford his PPI.  He was given a  prescription for Nexium but never got it filled.  He has been taking  some over-the-counter antacids, possibly H2 blocker.  He states that his  heartburn, he felt, was pretty well controlled up until recently.  For  the past 2-3 days, he has had significant pain in the epigastrium,  radiating up into his chest and left chest area.  He feels like when he  swallows his food, it just sits in his chest.  He never has any  vomiting.  He complains of chronic constipation.  He does have 1 bowel  movement a day but just feels like it is not a good bowel movement.  He takes X-Lax on the average of once-a-month.  He denies any rectal  bleeding.  He denies any melena.   He takes ibuprofen 200 mg, approximately 10 a day.  He states that he  really does not have any chronic pain and more or less out  of habit at  this point.  He was taking more recently for a toothache.  He recently  had a tooth extraction.   MEDICATIONS:  1. Lisinopril/HCTZ 10/12.5 mg daily.  2. Penicillin VK 500 mg q.i.d., status post tooth extraction.  3. Ibuprofen 200 mg approximately 10 daily.  4. OTC antacids.  5. Maalox.   ALLERGIES:  No known drug allergies.   PAST MEDICAL HISTORY:  1. Hypertension.  2. Gastroesophageal reflux disease with evidence of distal esophageal      erosions on last EGD in May, 2009.  3. Tobacco abuse.  4. Recent left facial soft tissue injury secondary to being hit with a      softball.  Nasal bone fracture.  5. History of positive H. pylori serology, status post treatment in      May, 2009.   PAST SURGICAL HISTORY:  Negative.   FAMILY HISTORY:  Mother deceased at age 68, possibly  MI.  Father has  heart disease.  No family history of colorectal cancer.   SOCIAL HISTORY:  He is divorced.  He has a 65 year old daughter.  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  He drinks about a 6-  pack of beer over the weekend, sometimes a little wine.  Denies any drug  use.  Smokes about a half-pack of cigarettes daily.   REVIEW OF SYSTEMS:  See HPI for GI.  CONSTITUTIONAL:  No weight loss.  CARDIOPULMONARY:  As above.  Denies shortness of breath or cough.  He  states that his chest pain is a lot better this morning.  GENITOURINARY:  No dysuria or hematuria.   PHYSICAL EXAMINATION:  Temp 98.1, pulse 107, respirations 21, blood  pressure 127/72.  O2 sat is 98% on room air.  GENERAL:  A pleasant, well-developed and well-nourished black gentleman  in no acute distress.  SKIN:  Warm and dry, no jaundice.  HEENT:  Sclerae are anicteric.  Oropharyngeal mucosa moist and pink.  He  has left periorbital ecchymosis.  No lymphadenopathy.  CHEST:  Lungs are clear to auscultation.  CARDIAC:  Regular rate and rhythm.  No murmurs, rubs or gallops.  ABDOMEN:  Positive bowel  sounds.  Abdomen is soft, nontender and  nondistended.  No organomegaly or masses.  No rebound or guarding.  No  abdominal bruits.  No hernias.  LOWER EXTREMITIES:  No edema.   LABS:  Cardiac enzymes negative x1.  Sodium 136, potassium 4, BUN 12,  creatinine 1.08, glucose 103.  WBCs 8800, hemoglobin 15.9, platelets  291,000.   CHEST X-RAY:  No acute disease.   IMPRESSION:  Patient is a 40 year old African American gentleman with  epigastric pain radiating up into his chest.  He has a history of prior  negative echo stress test.  He has a history of erosive reflux  esophagitis on EGD in May, 2009.  Suspect his symptoms are secondary to  untreated or refractory gastroesophageal reflux disease.  He really  needs to be on PPI therapy.  It is also concerning that he is taking  quite a significant amount of NSAIDs use, which could be causing  gastrointestinal irritation.  Cannot rule out peptic ulcer disease.  He  has some vague dysphagia, which is likely related to esophagitis,  refractory gastroesophageal reflux disease.  No strictures seen on prior  EGD.  He could have empirical esophageal dilation.  On looking back  through his records, he had an abdominal ultrasound with no evidence of  stones last year.  Would consider checking LFTs and lipase to exclude  any other source of his epigastric pain.   RECOMMENDATIONS:  1. Agree with Protonix 40 mg p.o. b.i.d.  Would continue b.i.d.      therapy for at least 4 weeks, then daily.  Would send out on      prescription Protonix.  Patient is advised that if he needs to have      Prilosec OTC or Prevacid 24-hour, he should take at a minimum, 2      daily.  He is also advised that if he has trouble obtaining PPI      medication due to expense, he should let our office know.  2. Limit ibuprofen use, discussed with patient.  3. Discontinue Helicobacter pylori serologies.  He previously was      positive and is status post treatment.  He could  have life-long  positive H. pylori antibody.  If symptoms do not settle down and H.      pylori is a concern, then would recommend checking H. pylori stool      antigen.  4. Will add LFTs and lipase.  5. Discuss further with Dr. Jena Gauss.  Consider options of EGD versus      upper GI series versus empirical treatment at this point.  6. Further recommendations to follow.      Tana Coast, P.AJonathon Bellows, M.D.  Electronically Signed    LL/MEDQ  D:  06/23/2008  T:  06/23/2008  Job:  [id number]   cc:   Skeet Latch, DO

## 2010-10-08 NOTE — Consult Note (Signed)
NAMEJOVANNIE, [name]               ACCOUNT NO.:  [id number]   MEDICAL RECORD NO.:  [id number]          PATIENT TYPE:  INP   LOCATION:  A204                          FACILITY:  [facility]   PHYSICIAN:  [name], M.D. DATE OF BIRTH:  1971/01/15   DATE OF CONSULTATION:  09/17/2007  DATE OF DISCHARGE:  09/17/2007                                 CONSULTATION   REASON FOR CONSULTATION:  Nausea, possible GERD symptoms, chest pain.   HISTORY OF PRESENT ILLNESS:  The patient is a 40 year old gentleman who  now presents with his fourth visit to the emergency department since  September 2008 for recurrent epigastric discomfort which radiates into  his chest associated with nausea.  He has been evaluated and treated for  GERD on all occasions.  He presented for his fourth episode for this  admission.  At this time, he states his nausea was very intense.  He  also has noted intermittent numbness in his legs and his left arm with  these symptoms.  He felt lightheaded and got concern with this episode  and worried about his heart.  He decided to come back to ED.  He has  been on Protonix 40 mg daily as an outpatient.  He quit smoking 3 weeks  ago, had a 5-pack-year history previously.  He describes having chronic  GERD-like symptoms.  He had a EGD over 10 years ago by Dr. Jena Gauss.  He  states he was placed on Prilosec at that time which seemed to help.  Over the past several month, he has developed  problems of swallowing  solid foods.  He feels like food is slow in going down and gets cut.  He  has wash it down with liquid.  Denies any vomiting.  Denies any weight  loss.  Denies any chronic constipation.  He generally does have a bowel  movement every day or every other day, that he says he does not get a  lot out.  Denies any melena, bright red blood per rectum.  Back in  March, when he was evaluated he had an  abdominal ultrasound which was  negative.  Current chest x-ray negative.  Previously,  LFTs has been  normal but they have not been checked this admission.  His CBC is  normal.  Cardiac enzymes negative times 2.  Third set pending.   MEDICATIONS:  At home, Protonix 40 mg daily.  He denies any NSAID or  aspirin use.   ALLERGIES:  No known drug allergies.   PAST MEDICAL HISTORY:  Obesity, history of tobacco abuse, but quit 3  weeks ago.   PAST SURGICAL HISTORY:  Negative.   FAMILY HISTORY:  Mother deceased at age 52 of possible MI.  Father also  with a history of  heart problems. He says his family history  significant for diabetes.  He is not aware of any colorectal cancer.   SOCIAL HISTORY:  Recently, laid off.  He is divorced; he has a 10-year-  old daughter.  He consumes 2 to 3 glasses of wine about 2 days a week.  Denies  any drug use.  Smoked for about 10 years a half pack a day, quit  3 weeks ago.   REVIEW OF SYSTEMS:  See HPI for GI.  Constitutional, denies any weight  loss.  Cardiopulmonary, see HPI.  The patient denies any dyspnea on  exertion, shortness of breath, cough.  Genitourinary, denies any  dysuria, hematuria.   PHYSICAL EXAMINATION:  VITAL SIGNS:  Temperature 97.8, pulse 74,  respirations 20, blood pressure 109/72, O2 sat is 95% on room air.  GENERAL:  Pleasant, slightly obese young gentleman in no acute distress.  SKIN:  Warm and dry.  No jaundice.  HEENT:  Sclerae nonicteric.  Oropharyngeal mucosa moist and pink.  No  lesion, erythema, or exudate.  No lymphadenopathy, thyromegaly.  CHEST:  Lungs are clear to auscultation.  CARDIAC:  Reveals regular rate and rhythm.  Normal S1 and S2.  No  murmurs, rubs, or gallops.  ABDOMEN:  Positive bowel sounds.  Abdomen is obese, but symmetrical.  He  has mild epigastric tenderness to deep palpation.  No rebound or  guarding.  No hepatosplenomegaly or masses.  No abdominal bruits or  hernias.  LOWER EXTREMITIES:  No edema.   LABS:  White count 8600, hemoglobin 14.4, platelets 256,000.  Sodium  136,  potassium 3.8, BUN 13, creatinine 1.21, glucose 115.  Total CK 236,  CK-MB less than 1 and then 1.6, troponin less than 0.05 and then 0.01.  Chest x-ray no acute chest finding.   IMPRESSION:  The patient is a 40 year old gentleman with recurrent chest  discomfort/epigastric pain associated with nausea and on this occasion  lightheadedness and intermittent left arm tingling and lower extremity  numbness.  This was his fourth presentation for ongoing symptoms.  He is  feeling better this morning.  He has been seen by cardiology who is  planning on outpatient stress test and echocardiogram.  He does have a  history of chronic GERD and his symptoms are probably a result of this.  He has solid food dysphagia may have developed esophageal web,  ring or  stricture.  He is not responding completely to PPI therapy, but admits  to taking on a regular basis.  We would further evaluate with the EGD.  He also has mild constipation which has been untreated.  No alarm  symptoms at this point.   RECOMMENDATIONS:  1. Outpatient EGD with possible esophageal dilatation by Dr. Jena Gauss.  2. MiraLax 17 g daily as needed constipation.  3. I have discussed with Dr. Osvaldo Shipper, the patient is stable for      discharge from the GI standpoint and we will continue workup as an      outpatient.      Tana Coast, P.AJonathon [name], M.D.  Electronically Signed    LL/MEDQ  D:  09/17/2007  T:  09/17/2007  Job:  [id number]   cc:   [facility]

## 2010-10-08 NOTE — H&P (Signed)
[name], [name] NO.:  [id number]   MEDICAL RECORD NO.:  [id number]          PATIENT TYPE:  OBV   LOCATION:  A308                          FACILITY:  [facility]   PHYSICIAN:  Skeet Latch, DO    DATE OF BIRTH:  [birth year]   DATE OF ADMISSION:  06/22/2008  DATE OF DISCHARGE:  LH                              HISTORY & PHYSICAL   CHIEF COMPLAINT:  Chest pain.   HISTORY OF THE PRESENT ILLNESS:  The patient is a 37-year African  American male who presents with chest/epigastric pain.  The patient  states that he has had chest and epigastric pain intermittently for the  last few months.  The patient states it is not affected any motion or  movement changes.  He states that he was admitted to [facility]  back in April with similar complaints.  The patient states that it is  not related to eating.  The patient states that the pain usually starts  in his lower abdomen and moves up to his chest.  The patient also has  been of some tingling and numbness in his extremities, upper and lower.  The patient states he had similar symptoms when he was here back in  April.  At that time he thinks he had a full workup by cardiology  including a stress test that was unremarkable.  The patient also had an  EGD and he was told to take Protonix.  The patient states that due to  financial reasons he never had that prescription filled.  The patient  states he went to the pharmacy and he was given supposedly a pill that  was equal in the efficacy; and, he states he takes that daily without  relief.  The patient does state that it feels like a heartburn-type  sensation, he does have a bad taste in his mouth and it is very similar  to the to GERD-type symptoms that he presented with back in April.  On  exam the patient is stable and in no acute distress.   PAST MEDICAL HISTORY:  The past medical history includes:  1. Gastroesophageal reflux disease.  2. Hypertension.  3. Hiatal  hernia.   SOCIAL HISTORY:  No history of drug abuse.  He is a smoker and he states  that he drinks beer and wine on the weekends.   ALLERGIES:  No known drug allergies.   MEDICATIONS:  The patient's home medications include:  1. Ibuprofen as needed.  2. Lisinopril hydrochlorothiazide 10/12.5 mg daily.  3. Penicillin VK 500 mg four times a day.   REVIEW OF SYSTEMS:  CONSTITUTIONAL:  No weight loss, weight gain,  decreased appetite, fever, or chills.  HEENT: Unremarkable.  CARDIOVASCULAR:  The patient has some chest discomfort.  No  palpitations.  RESPIRATORY:  No cough, dyspnea or wheezing.  GASTROINTESTINAL:  Positive for some epigastric pain with nausea and no  vomiting.  He does complain of constipation.  GENITOURINARY: There is no  discharge, urgency or frequency.  MUSCULOSKELETAL:  No arthritis,  __________ , back or neck pain.  INTEGUMENT:  Skin is unremarkable.  Other systems are unremarkable.   PHYSICAL EXAMINATION:  VITAL SIGNS:  Temperature is 98.3, respirations  are 20, pulse is 112 and blood pressure is 148/82.  GENERAL APPEARANCE:  In general he is well-developed, well-nourished and  well-hydrated, and in no acute distress.  HEENT:  Head is atraumatic and normocephalic.  Eyes: PERRL.  EOMI.  No  scleral icterus.  Oral mucosa is moist.  No lesions are noted.  NECK:  The neck is soft, supple, nontender and nondistended.  HEART:  Cardiovascular:  Regular rate and rhythm.  No murmurs, rubs or  gallops.  CHEST AND LUNGS:  Respiratory:  Lungs are clear to auscultation  bilaterally.  No rales, rhonchi or wheezing.  ABDOMEN:  The abdomen is soft.  He does have some epigastric tenderness  on deep palpation.  No guarding or rebound tenderness.  Positive bowel  sounds.  No masses are noted.  EXTREMITIES:  The extremities have no clubbing, cyanosis or edema.  NEUROLOGIC EXAMINATION:  Neuro; cranial nerves II-XII are grossly  intact.  The patient moves all extremities.  He is  alert and oriented  x3.   LABORATORY DATA:  EKG showed  98 beats per minute, normal sinus, normal  axis, normal PR and QRS intervals, and no ST-T wave changes are noted.  Labs showed:  Sodium 136, potassium 4.0, chloride 101, CO2 26, glucose  103, BUN 12, creatinine 1.08, and calcium and 9.5.  Myoglobin 59.2,  CK/MB 1.1 and troponin is less than 0.05.  Chest x-ray was stable.  No  active cardiopulmonary disease.  White count 8.8, hemoglobin 16.9,  hematocrit 45.9 and platelet count 291,000.   ASSESSMENT/PLAN:  1. Epigastric pain.  For this we will consult gastroenterology.  We      will place the patient on a proton pump inhibitor twice daily at      this time.  The patient does have some chest discomfort that seems      to be related to his gastroesophageal reflux disease.  The patient      did have a full workup back in April and May that included an      echocardiogram and a stress echo that were negative.  I feel the      patient will need a full workup from gastroenterology.  The patient      may need a Helicobacter pylori test to rule out Helicobacter      pylori; he believes  he was positive in the past.  2. Hypertension.  For the patient's hypertension we will place the      patient on his home medication at this time.  3. Tobacco abuse.  The patient will placed on a nicotine patch on a      daily basis.  4. Prophylaxes.  Lastly, the patient will be on deep venous thrombosis      as well as gastrointestinal prophylaxis.      Skeet Latch, DO  Electronically Signed     SM/MEDQ  D:  06/22/2008  T:  06/23/2008  Job:  [id number]

## 2010-10-08 NOTE — Discharge Summary (Signed)
NAMEQUENTIN, SHOREY               ACCOUNT NO.:  [id number]   MEDICAL RECORD NO.:  [id number]          PATIENT TYPE:  INP   LOCATION:  A204                          FACILITY:  [facility]   PHYSICIAN:  Osvaldo Shipper, MD     DATE OF BIRTH:  1971/05/03   DATE OF ADMISSION:  09/16/2007  DATE OF DISCHARGE:  04/24/2009LH                               DISCHARGE SUMMARY   PRIMARY CARE PHYSICIAN:  The patient does not have a PMD.   Please review the H and P dictated by Dr. Luciana Axe for details regarding  the patient's presenting illness.   DISCHARGE DIAGNOSIS:  Chest pain and abdominal pain likely related to  peptic ulcer disease and GERD (gastroesophageal reflux disease).   BRIEF HOSPITAL COURSE:  Briefly, this is a 40 year old African American  male who is somewhat obese who has a history of GERD who underwent  endoscopy about 10 years ago.  He presented with chronic complaints of  chest pains and abdominal pains.  He says the pain is mostly in the  abdomen and yesterday it went to the chest.  The pain onset was at rest.  No other precipitating, aggravating or relieving factors were  identified.  Based on the history the pain sounded more like related to  GI other than cardiac.  He does have some heart disease in the family of  unknown type.   I have discussed the case with GI folks who were consulted and they plan  to do an outpatient endoscopy on him.  Apparently the patient is also  mentioning some dysphagia symptoms.  Cardiology also saw the patient and  I am awaiting Dr. Tenny Craw to see this individual.  Her PA did see him and  the patient has been set up for an outpatient stress test on May 9.  Once Dr. Tenny Craw sees him and if she thinks he is stable for discharge the  patient will go home.  In the meantime his cardiac enzymes have been  negative.  His EKG was unremarkable.  Chest x-ray was negative so  overall he is stable for discharge.   DISCHARGE MEDICATIONS:  1. Aspirin 81 mg daily.  2.  Protonix 40 mg daily.  3. MiraLax 17 grams daily as needed for constipation.   FOLLOWUP:  1. For a stress echo I believe it is May 8 or May 9 to be done by      [facility] here at [facility].  2. EGD to be scheduled by the GI folks.   DIET:  Heart-healthy.   PHYSICAL ACTIVITY:  No restrictions.  The patient is written a note for  work.  He may return to work immediately.   Total time on this discharge encounter less than 30 minutes.      Osvaldo Shipper, MD  Electronically Signed     GK/MEDQ  D:  09/17/2007  T:  09/17/2007  Job:  [id number]   cc:   R. Roetta Sessions, M.D.  [address]  [place]  [place] [address]   Kassie Mends, M.D.  [address]  [place] , [place] [address]  Pricilla Riffle, MD, [facility]  [address]  [address]  [place]  [place] [address]

## 2010-10-08 NOTE — Consult Note (Signed)
NAMEPEARL, BERLINGER NO.:  [id number]   MEDICAL RECORD NO.:  [id number]          PATIENT TYPE:  INP   LOCATION:  A204                          FACILITY:  [facility]   PHYSICIAN:  Pricilla Riffle, MD, FACCDATE OF BIRTH:  Feb 16, 1971   DATE OF CONSULTATION:  09/17/2007  DATE OF DISCHARGE:  09/17/2007                                 CONSULTATION   REFERRING PHYSICIAN:  Osvaldo Shipper, M.D., [facility] Hospitalist, Team  P.   PRIMARY CARE PHYSICIAN:  Angus G. Renard Matter, M.D.   CARDIOLOGIST:  She is new , Dr. Dietrich Pates.   REASON FOR REFERRAL:  Chest pain.   HISTORY OF PRESENT ILLNESS:  Mr. Rowland is a 40 year old male patient  without documented coronary artery disease, who presented to [facility] Emergency Room yesterday with recurrent chest and epigastric  pain.  He has been seen in the emergency room several times over the  last year with similar complaints.  He has been diagnosed with GERD and  treated for such with proton pump inhibitors.  His symptoms in the past  have been brought on at times with eating.  He has been able to relieve  his symptoms in the past as well with eating as well as drinking cold  liquids.  He denies any history of exertional chest pain or shortness of  breath.  Yesterday, he noted recurrent chest pressure symptoms as well  as epigastric pain.  This lingered throughout the day, and he eventually  developed some nausea and lightheadedness and left arm tingling.  He  also noted some tingling in his feet.  He became concerned and came into  emergency room.  Upon presentation to the emergency room, his blood  pressure was somewhat elevated at 163/86.  Since admission, his point-of-  care markers and one set of regular cardiac markers have been negative.  His EKG is nonacute.  We were asked to further evaluate.  Of note, the  patient does admit to some symptoms of dysphagia.  He denies  odynophagia.  He denies any history of syncope,  orthopnea, PND, or pedal  edema.  He does have occasional palpitations.   PAST MEDICAL HISTORY:  1. GERD.  2. He has been told in the past his blood pressure is borderline      elevated.   MEDICATIONS PRIOR TO ADMISSION:  Protonix 40 mg daily.   ALLERGIES:  No known drug allergies.   SOCIAL HISTORY:  The patient lives in [place].  He is divorced.  He has 1  daughter who is 26 years old.  He works in [employer].  He has  a 5-pack-year history of smoking cigarettes and quit about 3 weeks ago.  He drinks alcohol on occasion.  He denies drug abuse.   FAMILY HISTORY:  Significant for heart problems in his mother.  He  states that she died at age 42 of a presumed myocardial infarction.  His  father is still alive and has heart problems.  The details are unknown.  (?) Family history of CAD.   REVIEW OF SYSTEMS:  Please  see HPI.  He denies any fevers, chills,  headache, sore throat, rash, dysuria, hematuria, bright red blood per  rectum, or melena.  He does note constipation.  Denies diarrhea.  Denies  symptoms of TIAs or claudication.  Rest of the review of systems are  negative.   PHYSICAL EXAMINATION:  GENERAL:  He is a well nourished, well developed,  not in distress.  VITAL SIGNS:  Blood pressure is 109/72, pulse 74, respirations 20,  temperature 97.8, oxygen saturation 95% on room air, and weight 107.7  kg.  HEENT:  Normal.  NECK:  Without JVD.  LYMPH:  Without lymphadenopathy.  ENDOCRINE:  Without thyromegaly.  CARDIAC:  Normal S1 and S2.  Regular rate and rhythm without murmur.  LUNGS:  Clear to auscultation bilaterally without wheezing, rhonchi, or  rales.  ABDOMEN:  Soft with normoactive bowel sounds.  No organomegaly.  Positive epigastric tenderness to palpation.  EXTREMITIES:  Without clubbing, cyanosis, or edema.  MUSCULOSKELETAL:  Without joint deformity.  NEUROLOGIC:  He is alert and oriented x3.  Cranial nerves II through XII  are grossly intact.  SKIN:   Without rash.  He does have a large nevus to his left calf that  has been present for many years without change.   Chest x-ray reveals no acute disease.  EKG normal sinus rhythm with a  heart rate of 84, normal axis, no acute changes.   LABORATORY DATA:  White count 8600, hemoglobin 14.4, hematocrit 41.6,  and platelet count 256,000.  Sodium 136, potassium 3.8, BUN 13,  creatinine 1.21, and glucose 115.  Cardiac enzymes as outlined above.   IMPRESSION:  1. Atypical chest pain.  2. Gastroesophageal reflux disease.  3. Family history of coronary artery disease.  4. Ex-smoker.  5. Hyperglycemia.  6. History of questionable borderline hypertension.  7. Obesity.   PLAN:  The patient presents with chest pain syndrome, atypical for  ischemia.  His symptoms seem to be more consistent with a  gastrointestinal etiology.  However, he does have cardiac risk factors.  His initial cardiac enzymes have been negative, and his EKG is nonacute.  We will check one more set of cardiac markers.  As long as this is  normal, we anticipate that the patient can be set up with an outpatient  stress echocardiogram.  For risk stratification, lipid panel will be  checked as well as a TSH and hemoglobin A1c.  The patient will be placed  on aspirin for primary prevention.  Further recommendations to follow.   Thank you very much for the consultation.  We will be glad to follow the  patient throughout the remainder of this admission.      Tereso Newcomer, PA-C      Pricilla Riffle, MD, [facility]  Electronically Signed    SW/MEDQ  D:  09/17/2007  T:  09/18/2007  Job:  [id number]   cc:   Angus G. Renard Matter, MD  Fax: [phone]   Osvaldo Shipper, MD

## 2010-10-08 NOTE — H&P (Signed)
[name], [name]               ACCOUNT NO.:  [id number]   MEDICAL RECORD NO.:  [id number]          PATIENT TYPE:  AMB   LOCATION:  DAY                           FACILITY:  [facility]   PHYSICIAN:  R. Roetta Sessions, M.D. DATE OF BIRTH:  1971-03-01   DATE OF ADMISSION:  DATE OF DISCHARGE:  LH                              HISTORY & PHYSICAL   Follow-up gastroesophageal reflux disease, history of erosive reflux  esophagitis, history of H. pylori gastritis treated previously.   [name] was briefly admitted to the [facility] back in  January this year with atypical chest pain.  He had not been on regular  PPI.  He has also had been significantly constipated.  We saw him in  consultation and recommended he limit ibuprofen use and he was continued  on a b.i.d. regimen of Protonix 40 mg orally daily.  He did not have any  imaging as far as I can tell when he was hospitalized recently.  He has  not his gallbladder evaluated at all.  He recently ran out of Nexium  which he ended up getting and was taking 40 mg once to twice daily.  He  does have intermittent postprandial band-like upper abdominal pain and  has not really had any more chest pain, typical reflux symptoms.  No  odynophagia or dysphagia.  He is constipated.  We previously recommended  he try MiraLax.  He was taking a dose once daily now and then without  much difference in his symptoms.  He has had any melena or rectal  bleeding.   There is no family history of colorectal neoplasia.   MEDICATIONS:  See updated list.   ALLERGIES:  NO KNOWN DRUG ALLERGIES.   PHYSICAL EXAMINATION:  Today, a 37-year gentleman resting comfortably,  weight 238, height 5 feet, 9, temp 98.2,  BP 110/84 on a large cuff,  pulse 83.  SKIN:  Warm and dry.  CHEST:  Lungs are clear to auscultation.  CARDIAC:  Regular rate and rhythm without murmur, gallop or rub  ABDOMEN:  Obese, positive bowel sounds, soft, nontender without  appreciable mass  or organomegaly.   ASSESSMENT:  1. History of gastroesophageal reflux disease/erosive reflux      esophagitis.  Antireflux lifestyle/diet emphasized.  He needs to      stay on Nexium 40 mg orally daily.  I gave him 30 pills in the way      of samples.  Constipation may or may not be contributing to his      intermittent postprandial abdominal pain.  I have asked him to      rachet up the use MiraLax 17 grams orally b.i.d. on a daily p.r.n.      basis to facilitate three reasonably good bowel movements weekly.  2. We will go ahead and do a gallbladder ultrasound just to make sure      that he does have any occult gallbladder pathology contributing to      symptoms.  We will make further recommendations once the above-      mentioned study is available  for review.   He does not have a family physician and we have encouraged him to  develop a relationship with a primary care physician.      [name], M.D.  Electronically Signed     RMR/MEDQ  D:  07/20/2008  T:  07/20/2008  Job:  [id number]

## 2010-10-08 NOTE — H&P (Signed)
[name], [name] NO.:  [id number]   MEDICAL RECORD NO.:  [id number]          PATIENT TYPE:  EMS   LOCATION:  ED                            FACILITY:  [facility]   PHYSICIAN:  Gardiner Barefoot, MD    DATE OF BIRTH:  22-Apr-1971   DATE OF ADMISSION:  09/16/2007  DATE OF DISCHARGE:                              HISTORY & PHYSICAL   PRIMARY CARE PHYSICIAN:  Unassigned.   CHIEF COMPLAINT:  Fatigue, lightheadedness, and epigastric pain.   HISTORY OF PRESENT ILLNESS:  This is a 40 year old male with no known  significant medical history who initially presented in September of 2008  with complaints of chest discomfort that occurred when he was eating and  was notable for numbness in his left arm.  The patient was seen in the  emergency room for that and complained of radiation to the epigastric  region, but with no known aggravating factors.  The patient at that time  was started on Prilosec and advised to seek a new physician.  The  patient then was seen in February 2009 with a complaint of palpitations  that occurred after he reported having more GERD-like symptoms.  However, workup in the emergency room was unrevealing including no  tachycardia at that time.  The patient was sent out again on continued  GERD therapy.  The patient again presented a third time in March of 2009  to [facility] with epigastric pain and some left chest  tightness, right upper arm and was again diagnosed with continued GERD  symptoms and encouraged to find a PCP.  Now the patient presents for a  4th time with similar vague symptoms of some lightheadedness, fatigue,  some common nausea.  Some epigastric pain that he does report is  relieved by the proton pump inhibitor, as well as some numbness in his  legs.  He does not associate any fever or vomiting with any of these  episodes.  He says these are intermittent episodes and not continuous.  They do not occur with any particular  activity, and prior to September  he has never had these before.   PAST MEDICAL HISTORY:  Obesity.  Tobacco abuse.   MEDICATIONS:  Pantoprazole 40 mg daily.   ALLERGIES:  NO KNOWN DRUG ALLERGIES.   SOCIAL HISTORY:  The patient is an occasional drinker and does smoke.  However, he does report that he quit 3 weeks ago.   FAMILY HISTORY:  Noncontributory.   REVIEW OF SYSTEMS:  Negative except as per the history of present  illness.   PHYSICAL EXAMINATION:  VITAL SIGNS:  Temp is 98.3, pulse is 71,  respirations 20, blood pressure is 150/84.  O2 sat is 98%.   GENERAL:  The patient is awake and alert, oriented x3, and appears in no  acute distress.   CARDIOVASCULAR:  Regular rate and rhythm, with no murmurs, rubs, or  gallops.   LUNGS:  Clear to auscultation bilaterally.   ABDOMEN:  Soft, nontender.  Nondistended.  Positive bowel sounds, and no  hepatosplenomegaly.  The patient is obese.  EXTREMITIES:  No clubbing, cyanosis, or edema.   LABORATORY DATA:  Includes an EKG which shows normal sinus rhythm.   Chest x-ray, no acute disease.   CK MB is less than 1.  Troponin is less than 0.05.  BMP within normal  limits, except for a glucose of 130, CBC within normal limits.   ASSESSMENT AND PLAN:  The patient's symptoms are rather vague and do not  fit any particular syndrome.  Certainly an atypical myocardial  infarction could be a possibility and will admit him for rule out  myocardial infarction and see if cardiology would consider him as a  candidate for a stress test.  Also certainly with his epigastric pain, a  gastritis with his history of smoking would be a potential etiology of  some of his symptoms including the nausea and pain.  Will have  gastroenterology see to see if he would need an  esophagogastroduodenoscopy at some point.  Also with his numbness there  is also concerns for stroke.  However, the numbness is in both legs  bilaterally and this would not be  consistent with a stroke.  He is  moving all extremities fine with no neurologic deficits.  So this would  be a very unlikely etiology for him.  Will admit the patient and start  him on Protonix intravenous and give him symptomatic treatment.  Will  hold on giving him aspirin at this time, if he does have an epigastric  gastritis or other exacerbation this may make it worse, though if he  does turn out to have elevated cardiac enzymes, will start his aspirin  and other medications.      Gardiner Barefoot, MD  Electronically Signed     RWC/MEDQ  D:  09/16/2007  T:  09/16/2007  Job:  [id number]

## 2010-10-08 NOTE — Op Note (Signed)
NAMEYAAKOV, SAINDON               ACCOUNT NO.:  [id number]   MEDICAL RECORD NO.:  [id number]          PATIENT TYPE:  AMB   LOCATION:  DAY                           FACILITY:  [facility]   PHYSICIAN:  R. Roetta Sessions, M.D. DATE OF BIRTH:  02/06/1971   DATE OF PROCEDURE:  DATE OF DISCHARGE:                               OPERATIVE REPORT   INDICATIONS FOR PROCEDURE:  A 40 year old gentleman positive family  history of colon cancer first-degree relative at a young age.  He says  he has no lower GI tract symptoms.  He has never had his lower GI tract  evaluated.  Colonoscopy is now being done.  Risks, benefits and  limitations have been discussed.  Please see documentation in the  medical record.   PROCEDURE NOTE:  O2 saturation, blood pressure, pulse, respiration were  monitored the entire procedure.   SEDATION:  Conscious sedation with Versed 4 mg IV Demerol mg IV in  divided doses.   INSTRUMENT USED:  The Pentax video chip system.   FINDINGS:  Digital rectal exam revealed no abnormalities.  The prep was  good.  Colon examination:  Colonic mucosa from rectosigmoid junction was  undertaken.  Scope was advanced through the left transverse right colon  to the appendiceal orifice, ileocecal valve and cecum.  These structures  well seen and photographed for the record.  From this level the scope  was slowly cautiously withdrawn.  All previous mentioned mucosal  surfaces were again seen.  The colonic mucosa appeared normal.  Scope  was pulled down to the rectum.  Retroflexion examination of the rectal  mucosa including retroflexed view of the anal verge demonstrated no  abnormalities.  The patient tolerated the procedure well.  As react  endoscopy cecal withdrawal time 8 minutes.   IMPRESSION:  Normal rectum, normal colon.   RECOMMENDATIONS:  Repeat screening colonoscopy in 5 years.      Jonathon Bellows, M.D.  Electronically Signed     RMR/MEDQ  D:  01/10/2009  T:  01/10/2009   Job:  [id number]   cc:   Angus G. Renard Matter, MD  Fax: [phone]

## 2010-10-19 ENCOUNTER — Emergency Department (HOSPITAL_COMMUNITY): Payer: Self-pay

## 2010-10-19 ENCOUNTER — Emergency Department (HOSPITAL_COMMUNITY)
Admission: EM | Admit: 2010-10-19 | Discharge: 2010-10-19 | Disposition: A | Discharge status: Home / Self Care | Payer: Self-pay | Attending: Emergency Medicine | Admitting: Emergency Medicine

## 2010-10-19 DIAGNOSIS — K449 Diaphragmatic hernia without obstruction or gangrene: Secondary | ICD-10-CM | POA: Insufficient documentation

## 2010-10-19 DIAGNOSIS — I10 Essential (primary) hypertension: Secondary | ICD-10-CM | POA: Insufficient documentation

## 2010-10-19 DIAGNOSIS — R079 Chest pain, unspecified: Secondary | ICD-10-CM | POA: Insufficient documentation

## 2010-10-19 DIAGNOSIS — K219 Gastro-esophageal reflux disease without esophagitis: Secondary | ICD-10-CM | POA: Insufficient documentation

## 2010-10-19 LAB — HEPATIC FUNCTION PANEL
ALT: 19 U/L (ref 0–53)
AST: 19 U/L (ref 0–37)
Albumin: 4 g/dL (ref 3.5–5.2)
Alkaline Phosphatase: 79 U/L (ref 39–117)
Bilirubin, Direct: 0.1 mg/dL (ref 0.0–0.3)
Indirect Bilirubin: 0.2 mg/dL — ABNORMAL LOW (ref 0.3–0.9)
Total Bilirubin: 0.3 mg/dL (ref 0.3–1.2)
Total Protein: 8 g/dL (ref 6.0–8.3)

## 2010-10-19 LAB — CK TOTAL AND CKMB (NOT AT ARMC)
CK, MB: 3.8 ng/mL (ref 0.3–4.0)
Relative Index: 1 (ref 0.0–2.5)
Total CK: 366 U/L — ABNORMAL HIGH (ref 7–232)

## 2010-10-19 LAB — DIFFERENTIAL
Basophils Absolute: 0 10*3/uL (ref 0.0–0.1)
Basophils Relative: 0 % (ref 0–1)
Eosinophils Absolute: 0.2 10*3/uL (ref 0.0–0.7)
Eosinophils Relative: 2 % (ref 0–5)
Lymphocytes Relative: 41 % (ref 12–46)
Lymphs Abs: 3.1 10*3/uL (ref 0.7–4.0)
Monocytes Absolute: 0.5 10*3/uL (ref 0.1–1.0)
Monocytes Relative: 7 % (ref 3–12)
Neutro Abs: 3.7 10*3/uL (ref 1.7–7.7)
Neutrophils Relative %: 49 % (ref 43–77)

## 2010-10-19 LAB — BASIC METABOLIC PANEL
BUN: 11 mg/dL (ref 6–23)
CO2: 24 mEq/L (ref 19–32)
Calcium: 10.1 mg/dL (ref 8.4–10.5)
Chloride: 105 mEq/L (ref 96–112)
Creatinine, Ser: 1.03 mg/dL (ref 0.4–1.5)
GFR calc Af Amer: >60 mL/min (ref >60)
GFR calc non Af Amer: >60 mL/min (ref >60)
Glucose, Bld: 99 mg/dL (ref 70–99)
Potassium: 4.4 mEq/L (ref 3.5–5.1)
Sodium: 138 mEq/L (ref 135–145)

## 2010-10-19 LAB — TROPONIN I: Troponin I: <0.3 ng/mL (ref <0.30)

## 2010-10-19 LAB — CBC
HCT: 45.7 % (ref 39.0–52.0)
Hemoglobin: 14.9 g/dL (ref 13.0–17.0)
MCH: 28.2 pg (ref 26.0–34.0)
MCHC: 32.6 g/dL (ref 30.0–36.0)
MCV: 86.4 fL (ref 78.0–100.0)
Platelets: 228 10*3/uL (ref 150–400)
RBC: 5.29 MIL/uL (ref 4.22–5.81)
RDW: 13.3 % (ref 11.5–15.5)
WBC: 7.4 10*3/uL (ref 4.0–10.5)

## 2010-11-02 ENCOUNTER — Emergency Department (HOSPITAL_COMMUNITY)
Admission: EM | Admit: 2010-11-02 | Discharge: 2010-11-03 | Disposition: A | Discharge status: Home / Self Care | Payer: Self-pay | Attending: Emergency Medicine | Admitting: Emergency Medicine

## 2011-01-09 ENCOUNTER — Encounter: Payer: Self-pay | Admitting: *Deleted

## 2011-01-09 ENCOUNTER — Emergency Department (HOSPITAL_COMMUNITY)
Admission: EM | Admit: 2011-01-09 | Discharge: 2011-01-09 | Disposition: A | Discharge status: Home / Self Care | Payer: Self-pay | Attending: Emergency Medicine | Admitting: Emergency Medicine

## 2011-01-09 DIAGNOSIS — K449 Diaphragmatic hernia without obstruction or gangrene: Secondary | ICD-10-CM | POA: Insufficient documentation

## 2011-01-09 DIAGNOSIS — I10 Essential (primary) hypertension: Secondary | ICD-10-CM | POA: Insufficient documentation

## 2011-01-09 DIAGNOSIS — F17200 Nicotine dependence, unspecified, uncomplicated: Secondary | ICD-10-CM | POA: Insufficient documentation

## 2011-01-09 DIAGNOSIS — K219 Gastro-esophageal reflux disease without esophagitis: Secondary | ICD-10-CM

## 2011-01-09 HISTORY — DX: Essential (primary) hypertension: I10

## 2011-01-09 HISTORY — DX: Diaphragmatic hernia without obstruction or gangrene: K44.9

## 2011-01-09 NOTE — ED Notes (Signed)
Pt reports a bad taste in his mouth, and increased saliva for the past few days.  Pt states "i just know something ain't right with my mouth".  Pt states " i think its from oral sex with my girlfriend".  Pt has some white areas to his tongue.  No swelling noted.  nad noted

## 2011-01-09 NOTE — ED Provider Notes (Signed)
History     CSN: [id number] Arrival date & time: 01/09/2011 12:37 PM  Chief Complaint  Patient presents with  . bad taste in mouth    Patient is a 40 y.o. male presenting with STD exposure. The history is provided by the patient.  Exposure to STD This is a new problem. The current episode started yesterday. The problem occurs constantly. The problem has been unchanged. Pertinent negatives include no abdominal pain, arthralgias, chest pain, congestion, fever, headaches, joint swelling, nausea, neck pain, numbness, rash, sore throat or weakness. Associated symptoms comments: He describes increased saliva and having to swallow more frequently since giving oral sex several days ago.  He denies pain,  Rash,  Ulceration or fever.  . The symptoms are aggravated by nothing. He has tried nothing for the symptoms.  Exposure to STD This is a new problem. The current episode started yesterday. The problem occurs constantly. The problem has been unchanged. Pertinent negatives include no chest pain, no abdominal pain, no headaches and no shortness of breath. Associated symptoms comments: He describes increased saliva and having to swallow more frequently since giving oral sex several days ago.  He denies pain,  Rash,  Ulceration or fever.  . The symptoms are aggravated by nothing. He has tried nothing for the symptoms.    Past Medical History  Diagnosis Date  . Hypertension   . Acid reflux   . Hiatal hernia     History reviewed. No pertinent past surgical history.  No family history on file.  History  Substance Use Topics  . Smoking status: Current Everyday Smoker  . Smokeless tobacco: Not on file  . Alcohol Use: Yes     occasional      Review of Systems  Constitutional: Negative for fever.  HENT: Negative for ear pain, congestion, sore throat, rhinorrhea and neck pain.   Eyes: Negative.   Respiratory: Negative for chest tightness and shortness of breath.   Cardiovascular: Negative for  chest pain.  Gastrointestinal: Negative for nausea and abdominal pain.  Genitourinary: Negative.   Musculoskeletal: Negative for joint swelling and arthralgias.  Skin: Negative.  Negative for rash and wound.  Neurological: Negative for dizziness, weakness, light-headedness, numbness and headaches.  Hematological: Negative.   Psychiatric/Behavioral: Negative.     Physical Exam  BP 143/98  Pulse 95  Temp(Src) 98.5 F (36.9 C) (Oral)  Resp 16  Ht 5\' 9"  (1.753 m)  Wt 236 lb (107.049 kg)  BMI 34.85 kg/m2  SpO2 98%  Physical Exam  Nursing note and vitals reviewed. Constitutional: He is oriented to person, place, and time. He appears well-developed and well-nourished.  HENT:  Head: Normocephalic and atraumatic.  Right Ear: External ear normal.  Left Ear: External ear normal.  Nose: Nose normal.  Mouth/Throat: Oropharynx is clear and moist and mucous membranes are normal. No oral lesions. Normal dentition. No uvula swelling. No oropharyngeal exudate or tonsillar abscesses.  Eyes: Conjunctivae are normal.  Neck: Normal range of motion.  Cardiovascular: Normal rate, regular rhythm, normal heart sounds and intact distal pulses.   Pulmonary/Chest: Effort normal and breath sounds normal. He has no wheezes.  Abdominal: Soft. Bowel sounds are normal. There is no tenderness.  Musculoskeletal: Normal range of motion.  Neurological: He is alert and oriented to person, place, and time.  Skin: Skin is warm and dry.  Psychiatric: He has a normal mood and affect.    ED Course  Procedures  MDM Patient reports partner is getting evaluated tomorrow.  Patient encouraged  to call back if she tests positive for any infection  - can call in script as indicated,  Despite current normal exam..    Medical screening examination/treatment/procedure(s) were performed by non-physician practitioner and as supervising physician I was immediately available for consultation/collaboration.    Candis Musa,  PA 01/09/11 1431  Suzi Roots, MD 01/18/11 [phone]

## 2011-01-09 NOTE — ED Notes (Signed)
Pt states started having bad taste in mouth yesterday.  States it's from performing oral sex.  Denies mouth sores.

## 2011-01-13 ENCOUNTER — Encounter (HOSPITAL_COMMUNITY): Payer: Self-pay | Admitting: *Deleted

## 2011-01-13 ENCOUNTER — Emergency Department (HOSPITAL_COMMUNITY)
Admission: EM | Admit: 2011-01-13 | Discharge: 2011-01-13 | Disposition: A | Discharge status: Home / Self Care | Payer: Self-pay | Attending: Emergency Medicine | Admitting: Emergency Medicine

## 2011-01-13 DIAGNOSIS — K449 Diaphragmatic hernia without obstruction or gangrene: Secondary | ICD-10-CM | POA: Insufficient documentation

## 2011-01-13 DIAGNOSIS — K219 Gastro-esophageal reflux disease without esophagitis: Secondary | ICD-10-CM | POA: Insufficient documentation

## 2011-01-13 DIAGNOSIS — F17200 Nicotine dependence, unspecified, uncomplicated: Secondary | ICD-10-CM | POA: Insufficient documentation

## 2011-01-13 DIAGNOSIS — I10 Essential (primary) hypertension: Secondary | ICD-10-CM | POA: Insufficient documentation

## 2011-01-13 DIAGNOSIS — J029 Acute pharyngitis, unspecified: Secondary | ICD-10-CM | POA: Insufficient documentation

## 2011-01-13 MED ORDER — AMOXICILLIN 250 MG PO CAPS
500.0000 mg | ORAL_CAPSULE | Freq: Once | ORAL | Status: AC
  Administered 2011-01-13: 500 mg via ORAL
  Filled 2011-01-13: qty 2

## 2011-01-13 MED ORDER — AMOXICILLIN 500 MG PO CAPS: 500.0000 mg | ORAL_CAPSULE | Freq: Three times a day (TID) | ORAL | Status: AC

## 2011-01-13 NOTE — ED Notes (Signed)
Pt c/o sore throat. Tonsils inflamed, throat streaked and red. White pustules on R tonsil.

## 2011-01-13 NOTE — ED Notes (Signed)
Sore throat x 4 days.  Denies fever/rash/trouble swallowing/difficulty breathing.  States "white stuff" is on the back of his throat.

## 2011-01-14 NOTE — ED Provider Notes (Signed)
History     CSN: [id number] Arrival date & time: 01/13/2011 12:30 PM  Chief Complaint  Patient presents with  . Sore Throat   Patient is a 40 y.o. male presenting with pharyngitis. The history is provided by the patient.  Sore Throat This is a new problem. The current episode started in the past 7 days. The problem occurs constantly. The problem has been unchanged. Associated symptoms include a sore throat. Pertinent negatives include no abdominal pain, arthralgias, chest pain, congestion, fever, headaches, joint swelling, nausea, neck pain, numbness, rash or weakness. Associated symptoms comments: He describes sore throat since having oral sex with his partner 5 days ago.  He was seen here 4 days ago,  At which time his rapid strep test was negative.  Since,  His partner did see her pcp and std screening tests were negative.  He returns today because he continues to have sore throat and has now noted a white "spot" on his right tonsil.  He states he may have been exposed to strep throat in a family member.. The symptoms are aggravated by swallowing. He has tried nothing for the symptoms.    Past Medical History  Diagnosis Date  . Hypertension   . Acid reflux   . Hiatal hernia     History reviewed. No pertinent past surgical history.  History reviewed. No pertinent family history.  History  Substance Use Topics  . Smoking status: Current Everyday Smoker  . Smokeless tobacco: Not on file  . Alcohol Use: Yes     occasional      Review of Systems  Constitutional: Negative for fever.  HENT: Positive for sore throat. Negative for congestion, rhinorrhea, sneezing, neck pain and voice change.   Eyes: Negative.   Respiratory: Negative for chest tightness and shortness of breath.   Cardiovascular: Negative for chest pain.  Gastrointestinal: Negative for nausea and abdominal pain.  Genitourinary: Negative.   Musculoskeletal: Negative for joint swelling and arthralgias.  Skin:  Negative.  Negative for rash and wound.  Neurological: Negative for dizziness, weakness, light-headedness, numbness and headaches.  Hematological: Negative.   Psychiatric/Behavioral: Negative.     Physical Exam  BP 143/86  Pulse 99  Temp(Src) 98.8 F (37.1 C) (Oral)  Resp 18  Ht 5\' 9"  (1.753 m)  Wt 236 lb (107.049 kg)  BMI 34.85 kg/m2  SpO2 100%  Physical Exam  Nursing note and vitals reviewed. Constitutional: He is oriented to person, place, and time. He appears well-developed and well-nourished.  HENT:  Head: Normocephalic and atraumatic.  Right Ear: External ear normal.  Left Ear: External ear normal.  Nose: Nose normal.  Mouth/Throat: Oropharyngeal exudate present. No posterior oropharyngeal edema, posterior oropharyngeal erythema or tonsillar abscesses.       Small focus of white exudate vs retained food particle right palatine tonsil crypt.    Eyes: Conjunctivae are normal.  Neck: Normal range of motion.  Cardiovascular: Normal rate, regular rhythm, normal heart sounds and intact distal pulses.   Pulmonary/Chest: Effort normal and breath sounds normal. He has no wheezes.  Abdominal: Soft. Bowel sounds are normal. There is no tenderness.  Musculoskeletal: Normal range of motion.  Neurological: He is alert and oriented to person, place, and time.  Skin: Skin is warm and dry.  Psychiatric: He has a normal mood and affect.    ED Course  Procedures  MDM Second visit for this patient for same complaint with possible strep exposure - will tx for presumptive strep given exposure and patient  concern.  Patient is satisfied with plan.      Candis Musa, PA 01/14/11 1002

## 2011-01-15 NOTE — ED Provider Notes (Signed)
Medical screening examination/treatment/procedure(s) were performed by non-physician practitioner and as supervising physician I was immediately available for consultation/collaboration.   Leigh-Ann Adriane Guglielmo, MD 01/15/11 0900 

## 2011-01-16 ENCOUNTER — Ambulatory Visit: Payer: Self-pay | Admitting: Urgent Care

## 2011-01-16 ENCOUNTER — Telehealth: Payer: Self-pay | Admitting: Urgent Care

## 2011-01-17 NOTE — Telephone Encounter (Signed)
Noted  

## 2011-02-14 LAB — DIFFERENTIAL
Basophils Absolute: 0
Basophils Relative: 0
Eosinophils Absolute: 0.2
Eosinophils Relative: 3
Lymphocytes Relative: 35
Lymphs Abs: 2.2
Monocytes Absolute: 0.4
Monocytes Relative: 6
Neutro Abs: 3.6
Neutrophils Relative %: 56

## 2011-02-14 LAB — CBC
HCT: 41.5
Hemoglobin: 14.2
MCHC: 34.2
MCV: 83.6
Platelets: 254
RBC: 4.97
RDW: 12.8
WBC: 6.5

## 2011-02-14 LAB — POCT CARDIAC MARKERS
CKMB, poc: 2.6
Myoglobin, poc: 115
Operator id: 189501
Troponin i, poc: <0.05

## 2011-02-14 LAB — BASIC METABOLIC PANEL
BUN: 16
CO2: 26
Calcium: 8.9
Chloride: 108
Creatinine, Ser: 1.19
GFR calc Af Amer: >60
GFR calc non Af Amer: >60
Glucose, Bld: 107 — ABNORMAL HIGH
Potassium: 4
Sodium: 138

## 2011-02-17 LAB — COMPREHENSIVE METABOLIC PANEL
ALT: 16
AST: 17
Albumin: 3.9
Alkaline Phosphatase: 49
BUN: 14
CO2: 25
Calcium: 9
Chloride: 106
Creatinine, Ser: 1.15
GFR calc Af Amer: >60
GFR calc non Af Amer: >60
Glucose, Bld: 97
Potassium: 4.2
Sodium: 137
Total Bilirubin: 0.5
Total Protein: 6.9

## 2011-02-17 LAB — LIPASE, BLOOD: Lipase: 26

## 2011-02-17 LAB — DIFFERENTIAL
Basophils Absolute: 0.1
Basophils Relative: 1
Eosinophils Absolute: 0.2
Eosinophils Relative: 2
Lymphocytes Relative: 28
Lymphs Abs: 2.7
Monocytes Absolute: 0.6
Monocytes Relative: 6
Neutro Abs: 6
Neutrophils Relative %: 62

## 2011-02-17 LAB — CBC
HCT: 44.4
Hemoglobin: 15.2
MCHC: 34.2
MCV: 84.4
Platelets: 265
RBC: 5.27
RDW: 12.8
WBC: 9.6

## 2011-02-17 LAB — POCT CARDIAC MARKERS
CKMB, poc: <1 — ABNORMAL LOW
Myoglobin, poc: 33.3
Operator id: 277751
Troponin i, poc: <0.05

## 2011-02-18 LAB — CK TOTAL AND CKMB (NOT AT ARMC)
CK, MB: 1.6
CK, MB: 1.8
Relative Index: 0.7
Relative Index: 0.8
Total CK: 223
Total CK: 236 — ABNORMAL HIGH

## 2011-02-18 LAB — DIFFERENTIAL
Basophils Absolute: 0
Basophils Absolute: 0
Basophils Relative: 0
Basophils Relative: 0
Eosinophils Absolute: 0.1
Eosinophils Absolute: 0.2
Eosinophils Relative: 1
Eosinophils Relative: 2
Lymphocytes Relative: 31
Lymphocytes Relative: 41
Lymphs Abs: 2.8
Lymphs Abs: 3.5
Monocytes Absolute: 0.5
Monocytes Absolute: 0.6
Monocytes Relative: 5
Monocytes Relative: 7
Neutro Abs: 4.3
Neutro Abs: 5.5
Neutrophils Relative %: 50
Neutrophils Relative %: 62

## 2011-02-18 LAB — HEPATIC FUNCTION PANEL
ALT: 18
AST: 18
Albumin: 4
Alkaline Phosphatase: 54
Bilirubin, Direct: 0.1
Indirect Bilirubin: 0.9
Total Bilirubin: 1
Total Protein: 7.7

## 2011-02-18 LAB — BASIC METABOLIC PANEL
BUN: 12
BUN: 13
CO2: 26
CO2: 28
Calcium: 9.3
Calcium: 9.6
Chloride: 100
Chloride: 103
Creatinine, Ser: 1.21
Creatinine, Ser: 1.28
GFR calc Af Amer: >60
GFR calc Af Amer: >60
GFR calc non Af Amer: >60
GFR calc non Af Amer: >60
Glucose, Bld: 115 — ABNORMAL HIGH
Glucose, Bld: 130 — ABNORMAL HIGH
Potassium: 3.8
Potassium: 4
Sodium: 136
Sodium: 137

## 2011-02-18 LAB — CBC
HCT: 41.6
HCT: 43
Hemoglobin: 14.4
Hemoglobin: 14.8
MCHC: 34.4
MCHC: 34.5
MCV: 83.3
MCV: 83.9
Platelets: 256
Platelets: 268
RBC: 4.99
RBC: 5.13
RDW: 12.8
RDW: 12.9
WBC: 8.6
WBC: 8.9

## 2011-02-18 LAB — TROPONIN I
Troponin I: 0.02
Troponin I: <0.01

## 2011-02-18 LAB — POCT CARDIAC MARKERS
CKMB, poc: <1 — ABNORMAL LOW
Myoglobin, poc: 56.3
Operator id: 198161
Troponin i, poc: <0.05

## 2011-02-18 LAB — LIPID PANEL
Cholesterol: 243 — ABNORMAL HIGH
HDL: 38 — ABNORMAL LOW
LDL Cholesterol: 150 — ABNORMAL HIGH
Total CHOL/HDL Ratio: 6.4
Triglycerides: 273 — ABNORMAL HIGH
VLDL: 55 — ABNORMAL HIGH

## 2011-02-18 LAB — HEMOGLOBIN A1C
Hgb A1c MFr Bld: 5.7
Mean Plasma Glucose: 126

## 2011-02-18 LAB — TSH: TSH: 2.168

## 2011-02-18 LAB — RPR: RPR Ser Ql: NONREACTIVE

## 2011-02-19 LAB — COMPREHENSIVE METABOLIC PANEL
ALT: 25
AST: 24
Albumin: 3.9
Alkaline Phosphatase: 49
BUN: 19
CO2: 26
Calcium: 9.3
Chloride: 103
Creatinine, Ser: 1.04
GFR calc Af Amer: >60
GFR calc non Af Amer: >60
Glucose, Bld: 105 — ABNORMAL HIGH
Potassium: 4.2
Sodium: 136
Total Bilirubin: 0.5
Total Protein: 7.3

## 2011-02-19 LAB — DIFFERENTIAL
Basophils Absolute: 0
Basophils Relative: 0
Eosinophils Absolute: 0.1
Eosinophils Relative: 1
Lymphocytes Relative: 39
Lymphs Abs: 3.6
Monocytes Absolute: 0.5
Monocytes Relative: 6
Neutro Abs: 4.9
Neutrophils Relative %: 54

## 2011-02-19 LAB — LIPASE, BLOOD: Lipase: 34

## 2011-02-19 LAB — CBC
HCT: 44.6
Hemoglobin: 15.5
MCHC: 34.8
MCV: 83.6
Platelets: 290
RBC: 5.34
RDW: 12.3
WBC: 9.1

## 2011-02-19 LAB — H. PYLORI ANTIBODY, IGG: H Pylori IgG: 1.2 — ABNORMAL HIGH

## 2011-02-21 LAB — RAPID URINE DRUG SCREEN, HOSP PERFORMED
Amphetamines: NOT DETECTED
Barbiturates: NOT DETECTED
Benzodiazepines: NOT DETECTED
Cocaine: NOT DETECTED
Opiates: NOT DETECTED
Tetrahydrocannabinol: NOT DETECTED

## 2011-02-21 LAB — URINALYSIS, ROUTINE W REFLEX MICROSCOPIC
Bilirubin Urine: NEGATIVE
Glucose, UA: NEGATIVE
Ketones, ur: NEGATIVE
Leukocytes, UA: NEGATIVE
Nitrite: NEGATIVE
Protein, ur: NEGATIVE
Specific Gravity, Urine: >1.03 — ABNORMAL HIGH
Urobilinogen, UA: 0.2
pH: 5.5

## 2011-02-21 LAB — COMPREHENSIVE METABOLIC PANEL
ALT: 19
AST: 18
Albumin: 3.8
Alkaline Phosphatase: 61
BUN: 14
CO2: 25
Calcium: 9.4
Chloride: 107
Creatinine, Ser: 0.97
GFR calc Af Amer: >60
GFR calc non Af Amer: >60
Glucose, Bld: 94
Potassium: 3.9
Sodium: 140
Total Bilirubin: 0.4
Total Protein: 6.9

## 2011-02-21 LAB — DIFFERENTIAL
Basophils Absolute: 0
Basophils Relative: 0
Eosinophils Absolute: 0.1
Eosinophils Relative: 2
Lymphocytes Relative: 41
Lymphs Abs: 2.7
Monocytes Absolute: 0.4
Monocytes Relative: 6
Neutro Abs: 3.4
Neutrophils Relative %: 51

## 2011-02-21 LAB — CBC
HCT: 44.8
Hemoglobin: 15.1
MCHC: 33.7
MCV: 83.6
Platelets: 242
RBC: 5.36
RDW: 12.7
WBC: 6.6

## 2011-02-21 LAB — POCT CARDIAC MARKERS
CKMB, poc: 1.4
Myoglobin, poc: 37
Operator id: 236211
Troponin i, poc: <0.05

## 2011-02-21 LAB — URINE MICROSCOPIC-ADD ON

## 2011-02-21 LAB — ETHANOL: Alcohol, Ethyl (B): 28 — ABNORMAL HIGH

## 2011-03-07 LAB — I-STAT 8, (EC8 V) (CONVERTED LAB)
Acid-base deficit: 1
BUN: 17
Bicarbonate: 23.9
Chloride: 106
Glucose, Bld: 116 — ABNORMAL HIGH
HCT: 48
Hemoglobin: 16.3
Operator id: 294521
Potassium: 3.8
Sodium: 139
TCO2: 25
pCO2, Ven: 41.4 — ABNORMAL LOW
pH, Ven: 7.368 — ABNORMAL HIGH

## 2011-03-07 LAB — HEPATIC FUNCTION PANEL
ALT: 25
AST: 32
Albumin: 3.6
Alkaline Phosphatase: 57
Bilirubin, Direct: <0.1
Total Bilirubin: 0.6
Total Protein: 6.7

## 2011-03-07 LAB — POCT CARDIAC MARKERS
CKMB, poc: 3.6
CKMB, poc: 4
Myoglobin, poc: 135
Myoglobin, poc: 140
Operator id: 294521
Operator id: 294521
Troponin i, poc: <0.05
Troponin i, poc: <0.05

## 2011-03-07 LAB — AMYLASE: Amylase: 95

## 2011-03-07 LAB — POCT I-STAT CREATININE
Creatinine, Ser: 1.3
Operator id: 294521

## 2011-03-07 LAB — LIPASE, BLOOD: Lipase: 28

## 2012-05-31 ENCOUNTER — Encounter: Payer: Self-pay | Admitting: Internal Medicine

## 2012-06-01 ENCOUNTER — Encounter: Payer: Self-pay | Admitting: Gastroenterology

## 2012-06-01 ENCOUNTER — Ambulatory Visit (INDEPENDENT_AMBULATORY_CARE_PROVIDER_SITE_OTHER): Payer: Self-pay | Admitting: Gastroenterology

## 2012-06-01 VITALS — BP 150/100 | HR 87 | Temp 98.5°F | Ht 69.0 in | Wt 243.0 lb

## 2012-06-01 DIAGNOSIS — R109 Unspecified abdominal pain: Secondary | ICD-10-CM | POA: Insufficient documentation

## 2012-06-01 DIAGNOSIS — K625 Hemorrhage of anus and rectum: Secondary | ICD-10-CM | POA: Insufficient documentation

## 2012-06-01 DIAGNOSIS — I10 Essential (primary) hypertension: Secondary | ICD-10-CM

## 2012-06-01 DIAGNOSIS — K5900 Constipation, unspecified: Secondary | ICD-10-CM | POA: Insufficient documentation

## 2012-06-01 LAB — HEPATIC FUNCTION PANEL
ALT: 23 U/L (ref 0–53)
AST: 20 U/L (ref 0–37)
Albumin: 4.6 g/dL (ref 3.5–5.2)
Alkaline Phosphatase: 56 U/L (ref 39–117)
Bilirubin, Direct: 0.1 mg/dL (ref 0.0–0.3)
Indirect Bilirubin: 0.5 mg/dL (ref 0.0–0.9)
Total Bilirubin: 0.6 mg/dL (ref 0.3–1.2)
Total Protein: 7.6 g/dL (ref 6.0–8.3)

## 2012-06-01 LAB — BASIC METABOLIC PANEL
BUN: 14 mg/dL (ref 6–23)
CO2: 30 mEq/L (ref 19–32)
Calcium: 9.8 mg/dL (ref 8.4–10.5)
Chloride: 102 mEq/L (ref 96–112)
Creat: 1.23 mg/dL (ref 0.50–1.35)
Glucose, Bld: 96 mg/dL (ref 70–99)
Potassium: 4.8 mEq/L (ref 3.5–5.3)
Sodium: 136 mEq/L (ref 135–145)

## 2012-06-01 LAB — CBC WITH DIFFERENTIAL/PLATELET
Basophils Absolute: 0 10*3/uL (ref 0.0–0.1)
Basophils Relative: 0 % (ref 0–1)
Eosinophils Absolute: 0.1 10*3/uL (ref 0.0–0.7)
Eosinophils Relative: 1 % (ref 0–5)
HCT: 47.9 % (ref 39.0–52.0)
Hemoglobin: 16.1 g/dL (ref 13.0–17.0)
Lymphocytes Relative: 36 % (ref 12–46)
Lymphs Abs: 3 10*3/uL (ref 0.7–4.0)
MCH: 28 pg (ref 26.0–34.0)
MCHC: 33.6 g/dL (ref 30.0–36.0)
MCV: 83.4 fL (ref 78.0–100.0)
Monocytes Absolute: 0.5 10*3/uL (ref 0.1–1.0)
Monocytes Relative: 6 % (ref 3–12)
Neutro Abs: 4.7 10*3/uL (ref 1.7–7.7)
Neutrophils Relative %: 57 % (ref 43–77)
Platelets: 241 10*3/uL (ref 150–400)
RBC: 5.74 MIL/uL (ref 4.22–5.81)
RDW: 13.2 % (ref 11.5–15.5)
WBC: 8.3 10*3/uL (ref 4.0–10.5)

## 2012-06-01 MED ORDER — HYDROCORTISONE ACETATE 25 MG RE SUPP: 25.0000 mg | Freq: Two times a day (BID) | RECTAL | Status: DC

## 2012-06-01 MED ORDER — OMEPRAZOLE MAGNESIUM 20 MG PO TBEC: 20.0000 mg | DELAYED_RELEASE_TABLET | Freq: Every day | ORAL | Status: DC

## 2012-06-01 NOTE — Patient Instructions (Addendum)
Please have blood work completed.  Start taking Prilosec each morning, 30 minutes before breakfast.   I have sent a prescription for Anusol to the pharmacy, which is for hemorrhoids. Use these suppositories twice a day for 7 days.   Avoid constipation and straining. Review the high fiber diet. Start taking a probiotic daily. This is over-the-counter with names to include Align, Restora, Digestive Advantage, Phillip's Colon Health. You may use Miralax daily as needed for a bowel movement.   You ultimately need an upper endoscopy and lower colonoscopy. In the meantime, we will treat the reflux and constipation.   Return in 4 weeks for reassessment and to set up procedures.     Diet for Gastroesophageal Reflux Disease, Adult Reflux (acid reflux) is when acid from your stomach flows up into the esophagus. When acid comes in contact with the esophagus, the acid causes irritation and soreness (inflammation) in the esophagus. When reflux happens often or so severely that it causes damage to the esophagus, it is called gastroesophageal reflux disease (GERD). Nutrition therapy can help ease the discomfort of GERD. FOODS OR DRINKS TO AVOID OR LIMIT  Smoking or chewing tobacco. Nicotine is one of the most potent stimulants to acid production in the gastrointestinal tract.   Caffeinated and decaffeinated coffee and black tea.   Regular or low-calorie carbonated beverages or energy drinks (caffeine-free carbonated beverages are allowed).     Strong spices, such as black pepper, white pepper, red pepper, cayenne, curry powder, and chili powder.   Peppermint or spearmint.   Chocolate.   High-fat foods, including meats and fried foods. Extra added fats including oils, butter, salad dressings, and nuts. Limit these to less than 8 tsp per day.   Fruits and vegetables if they are not tolerated, such as citrus fruits or tomatoes.   Alcohol.   Any food that seems to aggravate your condition.  If you  have questions regarding your diet, call your caregiver or a registered dietitian. OTHER THINGS THAT MAY HELP GERD INCLUDE:    Eating your meals slowly, in a relaxed setting.   Eating 5 to 6 small meals per day instead of 3 large meals.   Eliminating food for a period of time if it causes distress.   Not lying down until 3 hours after eating a meal.   Keeping the head of your bed raised 6 to 9 inches (15 to 23 cm) by using a foam wedge or blocks under the legs of the bed. Lying flat may make symptoms worse.   Being physically active. Weight loss may be helpful in reducing reflux in overweight or obese adults.   Wear loose fitting clothing  EXAMPLE MEAL PLAN This meal plan is approximately 2,000 calories based on https://www.bernard.org/ meal planning guidelines. Breakfast   cup cooked oatmeal.   1 cup strawberries.   1 cup low-fat milk.   1 oz almonds.  Snack  1 cup cucumber slices.   6 oz yogurt (made from low-fat or fat-free milk).  Lunch  2 slice whole-wheat bread.   2 oz sliced [place].   2 tsp mayonnaise.   1 cup blueberries.   1 cup snap peas.  Snack  6 whole-wheat crackers.   1 oz string cheese.  Dinner   cup brown rice.   1 cup mixed veggies.   1 tsp olive oil.   3 oz grilled fish.  Document Released: 05/12/2005 Document Revised: 08/04/2011 Document Reviewed: 03/28/2011 [facility] Patient Information 2013 [place], [place].  Constipation, Adult Constipation is when a person has fewer than 3 bowel movements a week; has difficulty having a bowel movement; or has stools that are dry, hard, or larger than normal. As people grow older, constipation is more common. If you try to fix constipation with medicines that make you have a bowel movement (laxatives), the problem may get worse. Long-term laxative use may cause the muscles of the colon to become weak. A low-fiber diet, not taking in enough fluids, and taking certain medicines may make constipation  worse. CAUSES    Certain medicines, such as antidepressants, pain medicine, iron supplements, antacids, and water pills.     Certain diseases, such as diabetes, irritable bowel syndrome (IBS), thyroid disease, or depression.     Not drinking enough water.     Not eating enough fiber-rich foods.     Stress or travel.   Lack of physical activity or exercise.   Not going to the restroom when there is the urge to have a bowel movement.   Ignoring the urge to have a bowel movement.   Using laxatives too much.  SYMPTOMS    Having fewer than 3 bowel movements a week.     Straining to have a bowel movement.     Having hard, dry, or larger than normal stools.     Feeling full or bloated.     Pain in the lower abdomen.   Not feeling relief after having a bowel movement.  DIAGNOSIS   Your caregiver will take a medical history and perform a physical exam. Further testing may be done for severe constipation. Some tests may include:    A barium enema X-ray to examine your rectum, colon, and sometimes, your small intestine.   A sigmoidoscopy to examine your lower colon.   A colonoscopy to examine your entire colon.  TREATMENT   Treatment will depend on the severity of your constipation and what is causing it. Some dietary treatments include drinking more fluids and eating more fiber-rich foods. Lifestyle treatments may include regular exercise. If these diet and lifestyle recommendations do not help, your caregiver may recommend taking over-the-counter laxative medicines to help you have bowel movements. Prescription medicines may be prescribed if over-the-counter medicines do not work.   HOME CARE INSTRUCTIONS    Increase dietary fiber in your diet, such as fruits, vegetables, whole grains, and beans. Limit high-fat and processed sugars in your diet, such as [place] fries, hamburgers, cookies, candies, and soda.     A fiber supplement may be added to your diet if you cannot get enough  fiber from foods.     Drink enough fluids to keep your urine clear or pale yellow.     Exercise regularly or as directed by your caregiver.     Go to the restroom when you have the urge to go. Do not hold it.   Only take medicines as directed by your caregiver. Do not take other medicines for constipation without talking to your caregiver first.  SEEK IMMEDIATE MEDICAL CARE IF:    You have bright red blood in your stool.     Your constipation lasts for more than 4 days or gets worse.     You have abdominal or rectal pain.     You have thin, pencil-like stools.   You have unexplained weight loss.  MAKE SURE YOU:    Understand these instructions.   Will watch your condition.   Will get help right away if you are not  doing well or get worse.  Document Released: 02/08/2004 Document Revised: 08/04/2011 Document Reviewed: 04/15/2011 [facility] Patient Information 2013 [place], [place].

## 2012-06-01 NOTE — Progress Notes (Signed)
Primary Care Physician:  Alice Reichert, MD Primary Gastroenterologist:  Dr. Jena Gauss   Chief Complaint  Patient presents with  . Abdominal Pain    HPI:   Pleasant 42 year old male with hx of GERD, last seen by Dr. Jena Gauss in August 2010. He also has a +FH of colon cancer, with his sister succumbing to the disease in her 44s. His initial colonoscopy was in August 2010; this was normal. He also had an EGD in may 2009 with erosive reflux esophagitis, s/p empiric Maloney dilation. Returns today with concerns regarding abdominal pain.  States pain all over upper abdomen, sometimes RUQ, sometimes epigastric, sometimes LUQ. Notes daily pain for the last 1-2 months. However, notes majority of pain is RUQ. Not aggravated by eating. Notes pain as constant; if he is busy doesn't notice as much. States feels tight "all the time". "So bloated". +nausea, no vomiting. +reflux intermittently, no dysphagia. Sometimes takes Prilosec OTC. Has taken Nexium in the past. Prilosec OTC works "half-way". No NSAIDs, no aspirin powders.   Thinks he has seen some dark blood in his stool, not bright red blood. Noted as occasionally in the stool itself. Doesn't think it is bright but knows it is blood. Notes rectal swelling, no discomfort. BM once a day but "small portions". Sometimes doesn't go at all. Feels like he "could go" but can't. Noted constipation issues for last 6 months. +wt gain. Eats what he shouldn't eat. No changes to medication (hasn't been on anything). Stopped taking BP pills. Thinks he was on Lisinopril/HCTZ. Hasn't seen Dr. Renard Matter in years. Last BP meds a year ago.    Past Medical History  Diagnosis Date  . Hypertension   . Acid reflux   . Hiatal hernia     Past Surgical History  Procedure Date  . Esophagogastroduodenoscopy 10/04/2007    RMR: Distal esophageal erosions consistent with erosive reflux esophagitis, patulous gastroesophageal junction status post passage of a  Maloney dilator, 56 [place].   Otherwise, unremarkable esophagus.  Hiatal hernia.  Otherwise normal stomach.  Bulbar erosion  . Colonoscopy 01/10/2009      RMR: Normal rectum, normal colon; repeat in 2015 due to FH of colon cancer    Current Outpatient Prescriptions  Medication Sig Dispense Refill  . omeprazole (PRILOSEC OTC) 20 MG tablet Take 20 mg by mouth daily.      . naphazoline (CLEAR EYES) 0.012 % ophthalmic solution Place 2 drops into both eyes daily.        . ranitidine (ZANTAC) 75 MG tablet Take 75 mg by mouth daily as needed. Acid Reflux         Allergies as of 06/01/2012  . (No Known Allergies)    Family History  Problem Relation Age of Onset  . Colon cancer Sister     passed away from colon cancer, in her 15s    History   Social History  . Marital Status: Single    Spouse Name: N/A    Number of Children: N/A  . Years of Education: N/A   Occupational History  . Architect in [employer]   Social History Main Topics  . Smoking status: Current Every Day Smoker -- 0.5 packs/day    Types: Cigarettes  . Smokeless tobacco: Not on file  . Alcohol Use: Yes     Comment: occasional/wine on the weekends  . Drug Use: No  . Sexually Active: Not on file   Other Topics Concern  . Not on file  Social History Narrative  . No narrative on file    Review of Systems: Gen: Denies any fever, chills, fatigue, weight loss, lack of appetite.  CV: +palpitations Resp: Denies shortness of breath at rest or with exertion. Denies wheezing or cough.  GI: SEE HPI GU : Denies urinary burning, urinary frequency, urinary hesitancy MS: Denies joint pain, muscle weakness, cramps, or limitation of movement.  Derm: Denies rash, itching, dry skin Psych: + stress Heme: Denies bruising, bleeding, and enlarged lymph nodes.  Physical Exam: BP 157/103  Pulse 87  Temp 98.5 F (36.9 C) (Oral)  Ht 5\' 9"  (1.753 m) General:   Alert and oriented. Pleasant and cooperative. Well-nourished and  well-developed.  Head:  Normocephalic and atraumatic. Eyes:  Without icterus, sclera clear and conjunctiva pink.  Ears:  Normal auditory acuity. Nose:  No deformity, discharge,  or lesions. Mouth:  No deformity or lesions, oral mucosa pink.  Neck:  Supple, without mass or thyromegaly. Lungs:  Clear to auscultation bilaterally. No wheezes, rales, or rhonchi. No distress.  Heart:  S1, S2 present without murmurs appreciated.  Abdomen:  +BS, soft, non-tender and non-distended. No HSM noted. No guarding or rebound. No masses appreciated.  Rectal:  No visible external hemorrhoids, query small internal hemorrhoids, good sphincter tone, no mass appreciated Msk:  Symmetrical without gross deformities. Normal posture. Extremities:  Without clubbing or edema. Neurologic:  Alert and  oriented x4;  grossly normal neurologically. Skin:  Intact without significant lesions or rashes. Cervical Nodes:  No significant cervical adenopathy. Psych:  Alert and cooperative. Normal mood and affect.

## 2012-06-01 NOTE — Assessment & Plan Note (Signed)
42 year old male with upper abdominal pain noted as constant, with majority of pain in RUQ. Also noted in epigastric area and LUQ. Not worsened with eating. EGD on file from 2009 with erosive reflux esophagitis, empiric Maloney dilation. Not noted above, he does have a hx of +H.pylori serologies s/p treatment. Denies NSAID use. Gallbladder remains in situ. Differentials include gastritis, PUD, biliary dyskinesia, doubt pancreatitis. He is currently not on a PPI. Needs EGD in future; however, his insurance does not start for another month. I have provided Kathie Rhodes Ratliff's number in the meantime.  Start Prilosec, samples provided CBC, BMP, HFP today Consider [place] vs CT if necessary EGD in future, to be discussed at 4 week f/u. Pt in no distress at visit.

## 2012-06-01 NOTE — Assessment & Plan Note (Signed)
Long-standing hx. Stopped BP meds at least a year ago. We attempted to have him see Dr. Renard Matter; however, when we called, they stated he hadn't been seen there since he was 13. [facility] I have ordered a BMP and requested he get in to see a PCP this week. Our office is trying to arrange this. Asymptomatic currently. Discussed signs/symptoms that would necessitate ED evaluation.

## 2012-06-01 NOTE — Assessment & Plan Note (Signed)
Worsening over past 6 months. Institute high fiber diet, Miralax prn. Consider Amitiza or Linzess. Needs TCS in near future. Dietary habits likely not helping.

## 2012-06-01 NOTE — Assessment & Plan Note (Signed)
Intermittent. Rectal exam without obvious abnormalities, but I do question internal hemorrhoids as the culprit. Anusol suppositories X 1 week provided, constipation literature and regimen provided. See constipation. Needs repeat colonoscopy at time of EGD due to new onset rectal bleeding, FH of colon cancer in first-degree relative. His last TCS was in 2010, so we are nearing the 5 year mark.  Return in 4 weeks to set up.  CBC today to assess for anemia

## 2012-06-02 NOTE — Progress Notes (Signed)
Faxed to PCP

## 2012-06-08 NOTE — Progress Notes (Signed)
Quick Note:  Tried to call pt- NA ______ 

## 2012-06-08 NOTE — Progress Notes (Signed)
Quick Note:  CBC, BMP, HFP normal.  How are pt's symptoms?   ______

## 2012-06-10 NOTE — Progress Notes (Signed)
Quick Note:  Pt aware, he stated he was a little better, has some pain at times. Advised him to call if he continues to have problems. He had to reschedule his appt to see someone about his blood pressure because he had to go to court but he will go to his next appt. ______

## 2012-06-22 NOTE — Progress Notes (Signed)
Quick Note:  Darl Pikes, Please have pt come in 4 weeks for f/u. ______

## 2012-06-23 ENCOUNTER — Encounter: Payer: Self-pay | Admitting: Internal Medicine

## 2012-07-21 ENCOUNTER — Telehealth: Payer: Self-pay | Admitting: Gastroenterology

## 2012-07-21 ENCOUNTER — Ambulatory Visit: Payer: Self-pay | Admitting: Gastroenterology

## 2012-07-21 NOTE — Telephone Encounter (Signed)
Pt was a no show

## 2012-07-27 ENCOUNTER — Encounter: Payer: Self-pay | Admitting: Gastroenterology

## 2012-08-11 ENCOUNTER — Ambulatory Visit: Payer: Self-pay | Admitting: Gastroenterology

## 2012-08-18 ENCOUNTER — Ambulatory Visit: Payer: Self-pay | Admitting: Gastroenterology

## 2012-08-18 ENCOUNTER — Telehealth: Payer: Self-pay | Admitting: Gastroenterology

## 2012-08-18 NOTE — Telephone Encounter (Signed)
Pt was a no show

## 2012-09-20 NOTE — Telephone Encounter (Signed)
Please offer letter for follow-up

## 2012-09-21 ENCOUNTER — Encounter: Payer: Self-pay | Admitting: General Practice

## 2012-09-21 NOTE — Telephone Encounter (Signed)
LETTER MAILED

## 2012-10-07 ENCOUNTER — Ambulatory Visit: Payer: Self-pay | Admitting: Gastroenterology

## 2012-10-07 ENCOUNTER — Telehealth: Payer: Self-pay | Admitting: Gastroenterology

## 2012-10-07 NOTE — Telephone Encounter (Signed)
Please send letter for f/u.  

## 2012-10-07 NOTE — Telephone Encounter (Signed)
Pt was a no show

## 2012-10-13 ENCOUNTER — Encounter: Payer: Self-pay | Admitting: Gastroenterology

## 2012-10-13 NOTE — Telephone Encounter (Signed)
Letter mailed to patient to call our office to [facility]

## 2013-05-13 ENCOUNTER — Emergency Department (HOSPITAL_COMMUNITY)
Admission: EM | Admit: 2013-05-13 | Discharge: 2013-05-14 | Disposition: A | Discharge status: Home / Self Care | Payer: Self-pay | Attending: Emergency Medicine | Admitting: Emergency Medicine

## 2013-05-13 ENCOUNTER — Encounter (HOSPITAL_COMMUNITY): Payer: Self-pay | Admitting: Emergency Medicine

## 2013-05-13 ENCOUNTER — Emergency Department (HOSPITAL_COMMUNITY): Payer: Self-pay

## 2013-05-13 DIAGNOSIS — F17200 Nicotine dependence, unspecified, uncomplicated: Secondary | ICD-10-CM | POA: Insufficient documentation

## 2013-05-13 DIAGNOSIS — R0602 Shortness of breath: Secondary | ICD-10-CM | POA: Insufficient documentation

## 2013-05-13 DIAGNOSIS — R05 Cough: Secondary | ICD-10-CM | POA: Insufficient documentation

## 2013-05-13 DIAGNOSIS — R0789 Other chest pain: Secondary | ICD-10-CM | POA: Insufficient documentation

## 2013-05-13 DIAGNOSIS — R059 Cough, unspecified: Secondary | ICD-10-CM | POA: Insufficient documentation

## 2013-05-13 DIAGNOSIS — K219 Gastro-esophageal reflux disease without esophagitis: Secondary | ICD-10-CM | POA: Insufficient documentation

## 2013-05-13 DIAGNOSIS — R6884 Jaw pain: Secondary | ICD-10-CM | POA: Insufficient documentation

## 2013-05-13 DIAGNOSIS — I10 Essential (primary) hypertension: Secondary | ICD-10-CM | POA: Insufficient documentation

## 2013-05-13 LAB — BASIC METABOLIC PANEL
BUN: 11 mg/dL (ref 6–23)
CO2: 28 mEq/L (ref 19–32)
Calcium: 9.9 mg/dL (ref 8.4–10.5)
Chloride: 98 mEq/L (ref 96–112)
Creatinine, Ser: 1.17 mg/dL (ref 0.50–1.35)
GFR calc Af Amer: 86 mL/min — ABNORMAL LOW (ref >90)
GFR calc non Af Amer: 74 mL/min — ABNORMAL LOW (ref >90)
Glucose, Bld: 97 mg/dL (ref 70–99)
Potassium: 4.6 mEq/L (ref 3.5–5.1)
Sodium: 137 mEq/L (ref 135–145)

## 2013-05-13 LAB — CBC
HCT: 47.8 % (ref 39.0–52.0)
Hemoglobin: 15.7 g/dL (ref 13.0–17.0)
MCH: 28.2 pg (ref 26.0–34.0)
MCHC: 32.8 g/dL (ref 30.0–36.0)
MCV: 85.8 fL (ref 78.0–100.0)
Platelets: 260 10*3/uL (ref 150–400)
RBC: 5.57 MIL/uL (ref 4.22–5.81)
RDW: 12.5 % (ref 11.5–15.5)
WBC: 9.8 10*3/uL (ref 4.0–10.5)

## 2013-05-13 LAB — TROPONIN I: Troponin I: <0.3 ng/mL (ref <0.30)

## 2013-05-13 MED ORDER — ASPIRIN 81 MG PO CHEW
324.0000 mg | CHEWABLE_TABLET | Freq: Once | ORAL | Status: AC
  Administered 2013-05-13: 324 mg via ORAL
  Filled 2013-05-13: qty 4

## 2013-05-13 MED ORDER — LISINOPRIL-HYDROCHLOROTHIAZIDE 10-12.5 MG PO TABS: 1.0000 | ORAL_TABLET | Freq: Every day | ORAL | Status: DC

## 2013-05-13 MED ORDER — NITROGLYCERIN 2 % TD OINT
1.0000 [in_us] | TOPICAL_OINTMENT | Freq: Once | TRANSDERMAL | Status: AC
  Administered 2013-05-13: 1 [in_us] via TOPICAL
  Filled 2013-05-13: qty 1

## 2013-05-13 MED ORDER — GI COCKTAIL ~~LOC~~
30.0000 mL | Freq: Once | ORAL | Status: AC
  Administered 2013-05-13: 30 mL via ORAL
  Filled 2013-05-13: qty 30

## 2013-05-13 MED ORDER — FAMOTIDINE 20 MG PO TABS
20.0000 mg | ORAL_TABLET | Freq: Once | ORAL | Status: AC
  Administered 2013-05-13: 20 mg via ORAL
  Filled 2013-05-13: qty 1

## 2013-05-13 NOTE — ED Notes (Signed)
Pt c/o chest pain, epigastric pain, nausea, and legs feel cold.

## 2013-05-13 NOTE — ED Notes (Signed)
Patient transported to CT 

## 2013-05-13 NOTE — ED Provider Notes (Signed)
CSN: [id number]     Arrival date & time 05/13/13  1843 History   First MD Initiated Contact with Patient 05/13/13 1950 This chart was scribed for Ward Givens, MD by Valera Castle, ED Scribe. This patient was seen in room APA09/APA09 and the patient's care was started at 8:13 PM.     Chief Complaint  Patient presents with  . Chest Pain  . Nausea  . Abdominal Pain    The history is provided by the patient. No language interpreter was used.   HPI Comments: [name] is a 42 y.o. male with h/o chest pain consistent with acid reflux for several years, but has been constant for several months,  who presents to the Emergency Department complaining of  Moderate, constant right sided chest pain that radiates to his left side, with associated chest tightness, onset 2 days ago. He denies current chest pain, just pressure, with a severity of 5/10. He reports associated nausea with the chest pain. He reports intermittent SOB "sometimes" , onset 2 days ago. He reports intermittent mild cough. He also reports right  jaw pain, 3-4 days ago, that lasted a few days. He denies current jaw pain. He states he has "been hurting all over for the past week".   He states that sweet foods exacerbate his h/o acid reflux. He denies taking any medication for his acid reflux. Pt relates he has had esophageal strictures and stretching done in the past.   He reports he quit his blood pressure medication 6 months to a year ago. He states he was on Lisinopril 10 mg. He states it came with a fluid pill 12.5.    He denies emesis, diaphoresis, fever, sore throat, rhinorrhea, diarrhea, and any other associated symptoms. He reports h/o smoking 0.5 PPD and occasional  EtOH use. 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 He reports DM and high blood pressure run in the family. He reports his maternal uncle had h/o MI.   PCP - none was going to [facility]     Past Medical History  Diagnosis  Date  . Hypertension   . Acid reflux   . Hiatal hernia    Past Surgical History  Procedure Laterality Date  . Esophagogastroduodenoscopy  10/04/2007    RMR: Distal esophageal erosions consistent with erosive reflux esophagitis, patulous gastroesophageal junction status post passage of a  Maloney dilator, 56 [place].  Otherwise, unremarkable esophagus.  Hiatal hernia.  Otherwise normal stomach.  Bulbar erosion  . Colonoscopy  01/10/2009      RMR: Normal rectum, normal colon; repeat in 2015 due to FH of colon cancer   Family History  Problem Relation Age of Onset  . Colon cancer Sister     passed away from colon cancer, in her 33s   History  Substance Use Topics  . Smoking status: Current Every Day Smoker -- 0.50 packs/day    Types: Cigarettes  . Smokeless tobacco: Not on file  . Alcohol Use: Yes     Comment: occasional/wine on the weekends  laid off, working Holiday representative  Review of Systems  Constitutional: Negative for fever and diaphoresis.  HENT: Negative for rhinorrhea and sore throat.        Positive for jaw pain.  Respiratory: Positive for cough, chest tightness and shortness of breath.   Cardiovascular: Positive for chest pain.  Gastrointestinal: Positive for nausea. Negative for vomiting and diarrhea.  All other systems reviewed and are negative.    Allergies  Review  of patient's allergies indicates no known allergies.  Home Medications   Current Outpatient Rx  Name  Route  Sig  Dispense  Refill  . naphazoline (CLEAR EYES) 0.012 % ophthalmic solution   Both Eyes   Place 2 drops into both eyes daily as needed (for dry/red eye relief).           BP 151/98  Pulse 100  Temp(Src) 98.6 F (37 C) (Oral)  Resp 17  Ht 5\' 9"  (1.753 m)  Wt 242 lb (109.77 kg)  BMI 35.72 kg/m2  SpO2 99%  Vital signs normal    Physical Exam  Nursing note and vitals reviewed. Constitutional: He is oriented to person, place, and time. He appears well-developed and  well-nourished.  Non-toxic appearance. He does not appear ill. No distress.  HENT:  Head: Normocephalic and atraumatic.  Right Ear: External ear normal.  Left Ear: External ear normal.  Nose: Nose normal. No mucosal edema or rhinorrhea.  Mouth/Throat: Oropharynx is clear and moist and mucous membranes are normal. No dental abscesses or uvula swelling.  Eyes: Conjunctivae and EOM are normal. Pupils are equal, round, and reactive to light.  Neck: Normal range of motion and full passive range of motion without pain. Neck supple.  Cardiovascular: Normal rate, regular rhythm and normal heart sounds.  Exam reveals no gallop and no friction rub.   No murmur heard. Pulmonary/Chest: Effort normal and breath sounds normal. No respiratory distress. He has no wheezes. He has no rhonchi. He has no rales. He exhibits no tenderness and no crepitus.  Abdominal: Soft. Normal appearance and bowel sounds are normal. He exhibits no distension. There is no tenderness. There is no rebound and no guarding.  Musculoskeletal: Normal range of motion. He exhibits no edema and no tenderness.  Moves all extremities well.   Neurological: He is alert and oriented to person, place, and time. He has normal strength. No cranial nerve deficit.  Skin: Skin is warm, dry and intact. No rash noted. No erythema. No pallor.  Psychiatric: He has a normal mood and affect. His speech is normal and behavior is normal. His mood appears not anxious.    ED Course  Procedures (including critical care time)  Medications  nitroGLYCERIN (NITROGLYN) 2 % ointment 1 inch (1 inch Topical Given 05/13/13 2028)  aspirin chewable tablet 324 mg (324 mg Oral Given 05/13/13 2028)  gi cocktail (Maalox,Lidocaine,Donnatal) (30 mLs Oral Given 05/13/13 2053)  famotidine (PEPCID) tablet 20 mg (20 mg Oral Given 05/13/13 2054)     DIAGNOSTIC STUDIES: Oxygen Saturation is 99% on room air, normal by my interpretation.    COORDINATION OF CARE: 8:19  PM-Discussed treatment plan which includes CXR and blood work with pt at bedside and pt agreed to plan.   Pt feeling better. Ready to be discharged. Patient given blood pressure medication that was $4 [employer]. He's advised to start taking PPI which are now over-the-counter. Review of his chart showed back in January he told his GI doctor he had not taken his blood pressure medication in a year. They had tried to arrange to get him a PCP Dr. at that time.  Labs Review Results for orders placed during the hospital encounter of 05/13/13  CBC      Result Value Range   WBC 9.8  4.0 - 10.5 K/uL   RBC 5.57  4.22 - 5.81 MIL/uL   Hemoglobin 15.7  13.0 - 17.0 g/dL   HCT 16.1  09.6 - 04.5 %   MCV  85.8  78.0 - 100.0 fL   MCH 28.2  26.0 - 34.0 pg   MCHC 32.8  30.0 - 36.0 g/dL   RDW 82.9  56.2 - 13.0 %   Platelets 260  150 - 400 K/uL  BASIC METABOLIC PANEL      Result Value Range   Sodium 137  135 - 145 mEq/L   Potassium 4.6  3.5 - 5.1 mEq/L   Chloride 98  96 - 112 mEq/L   CO2 28  19 - 32 mEq/L   Glucose, Bld 97  70 - 99 mg/dL   BUN 11  6 - 23 mg/dL   Creatinine, Ser 8.65  0.50 - 1.35 mg/dL   Calcium 9.9  8.4 - 78.4 mg/dL   GFR calc non Af Amer 74 (*) >90 mL/min   GFR calc Af Amer 86 (*) >90 mL/min  TROPONIN I      Result Value Range   Troponin I <0.30  <0.30 ng/mL   Laboratory interpretation all normal     Imaging Review Dg Chest 2 View  05/13/2013   CLINICAL DATA:  Fever.  History of smoking.  EXAM: CHEST  2 VIEW  COMPARISON:  Chest x-ray 10/19/2010.  FINDINGS: Lung volumes are normal. No consolidative airspace disease. No pleural effusions. No pneumothorax. No pulmonary nodule or mass noted. Pulmonary vasculature and the cardiomediastinal silhouette are within normal limits.  IMPRESSION: 1.  No radiographic evidence of acute cardiopulmonary disease.   Electronically Signed   By: Trudie Reed M.D.   On: 05/13/2013 20:40    EKG Interpretation    Date/Time:  Friday May 13 2013  19:14:56 EST Ventricular Rate:  95 PR Interval:  146 QRS Duration: 86 QT Interval:  326 QTC Calculation: 409 R Axis:   41 Text Interpretation:  Normal sinus rhythm Moderate voltage criteria for LVH, may be normal variant When compared with ECG of 19-Oct-2010 15:57, No significant change was found Confirmed by Zamara Cozad  MD-I, Markeisha Mancias ([id number]) on 05/13/2013 8:55:50 PM             MDM   1. Atypical chest pain   2. GERD (gastroesophageal reflux disease)   3. Hypertension    New Prescriptions   LISINOPRIL-HYDROCHLOROTHIAZIDE (PRINZIDE,ZESTORETIC) 10-12.5 MG PER TABLET    Take 1 tablet by mouth daily.    Plan discharge   Devoria Albe, MD, FACEP    I personally performed the services described in this documentation, which was scribed in my presence. The recorded information has been reviewed and considered.  Devoria Albe, MD, Armando Gang    Ward Givens, MD 05/13/13 [phone]

## 2013-12-19 ENCOUNTER — Other Ambulatory Visit: Payer: Self-pay | Admitting: Internal Medicine

## 2013-12-19 ENCOUNTER — Ambulatory Visit (INDEPENDENT_AMBULATORY_CARE_PROVIDER_SITE_OTHER): Payer: Self-pay | Admitting: Internal Medicine

## 2013-12-19 ENCOUNTER — Encounter: Payer: Self-pay | Admitting: Internal Medicine

## 2013-12-19 ENCOUNTER — Encounter (INDEPENDENT_AMBULATORY_CARE_PROVIDER_SITE_OTHER): Payer: Self-pay

## 2013-12-19 VITALS — BP 150/95 | HR 98 | Temp 99.2°F | Ht 69.0 in | Wt 247.6 lb

## 2013-12-19 DIAGNOSIS — Z800 Family history of malignant neoplasm of digestive organs: Secondary | ICD-10-CM

## 2013-12-19 DIAGNOSIS — R198 Other specified symptoms and signs involving the digestive system and abdomen: Secondary | ICD-10-CM

## 2013-12-19 DIAGNOSIS — K2100 Gastro-esophageal reflux disease with esophagitis, without bleeding: Secondary | ICD-10-CM

## 2013-12-19 DIAGNOSIS — R194 Change in bowel habit: Secondary | ICD-10-CM

## 2013-12-19 DIAGNOSIS — R1319 Other dysphagia: Secondary | ICD-10-CM

## 2013-12-19 DIAGNOSIS — K219 Gastro-esophageal reflux disease without esophagitis: Secondary | ICD-10-CM

## 2013-12-19 NOTE — Patient Instructions (Signed)
Schedule colonoscopy - change in bowel habits and positive family history  Split movie prep  Schedule EGD at same time as TCS - Dysphagia and GERD

## 2013-12-19 NOTE — Progress Notes (Signed)
Primary Care Physician:  Raiford Simmonds., PA-C Primary Gastroenterologist:  Dr. Gala Romney  Pre-Procedure History & Physical: HPI:  [name] is a 43 y.o. male here for followup of chronic abdominal pain, positive family history colon cancer, poorly controlled GERD and recurrent dysphagia.  Patient admits global noncompliance with [place] and primary care provider. He was to undergo a colonoscopy and EGD over a year ago but has not followed up. He continues to have abdominal pain in the setting of constipation. Not taking Linzess.   No PPI. Responded to empiric dilation previously. Erosive reflux esophagitis on prior EGD. Sister succumbed to colon cancer in her 61s.  Past Medical History  Diagnosis Date  . Hypertension   . Acid reflux   . Hiatal hernia     Past Surgical History  Procedure Laterality Date  . Esophagogastroduodenoscopy  10/04/2007    RMR: Distal esophageal erosions consistent with erosive reflux esophagitis, patulous gastroesophageal junction status post passage of a  Maloney dilator, 41 [place].  Otherwise, unremarkable esophagus.  Hiatal hernia.  Otherwise normal stomach.  Bulbar erosion  . Colonoscopy  01/10/2009      RMR: Normal rectum, normal colon; repeat in 2015 due to [place] of colon cancer    Prior to Admission medications   Medication Sig Start Date End Date Taking? Authorizing Provider  naphazoline (CLEAR EYES) 0.012 % ophthalmic solution Place 2 drops into both eyes daily as needed (for dry/red eye relief).    Yes Historical Provider, MD  omeprazole (PRILOSEC) 20 MG capsule Take 20 mg by mouth daily. As needed   Yes Historical Provider, MD  lisinopril-hydrochlorothiazide (PRINZIDE,ZESTORETIC) 10-12.5 MG per tablet Take 1 tablet by mouth daily. 05/13/13   Janice Norrie, MD    Allergies as of 12/19/2013  . (No Known Allergies)    Family History  Problem Relation Age of Onset  . Colon cancer Sister     passed away from colon cancer, in her 9s    History    Social History  . Marital Status: Single    Spouse Name: N/A    Number of Children: N/A  . Years of Education: N/A   Occupational History  . 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 Smoking status: Current Every Day Smoker    Types: Cigarettes  . Smokeless tobacco: Not on file     Comment: smokes 10 cigarettes daily  . Alcohol Use: Yes     Comment: occasional/wine on the weekends  . Drug Use: No  . Sexual Activity: Not on file   Other Topics Concern  . Not on file   Social History Narrative  . No narrative on file    Review of Systems: See HPI, otherwise negative ROS  Physical Exam: BP 150/95  Pulse 98  Temp(Src) 99.2 F (37.3 C) (Oral)  Ht 5\' 9"  (1.753 m)  Wt 247 lb 9.6 oz (112.311 kg)  BMI 36.55 kg/m2 General:   Alert,  Well-developed, well-nourished, pleasant and cooperative in NAD Skin:  Intact without significant lesions or rashes. Eyes:  Sclera clear, no icterus.   Conjunctiva pink. Ears:  Normal auditory acuity. Nose:  No deformity, discharge,  or lesions. Mouth:  No deformity or lesions. Neck:  Supple; no masses or thyromegaly. No significant cervical adenopathy. Lungs:  Clear throughout to auscultation.   No wheezes, crackles, or rhonchi. No acute distress. Heart:  Regular rate and rhythm; no murmurs, clicks, rubs,  or  gallops. Abdomen: Obese. Non-distended, normal bowel sounds.  Soft and nontender without appreciable mass or hepatosplenomegaly.  Pulses:  Normal pulses noted. Extremities:  Without clubbing or edema.  Impression:  Noncompliant 43 year old gentleman with change in bowel habits  -  over due for high risk screening colonoscopy. Poorly controlled GERD with recurrent esophageal dysphagia.  Discussed compliance issues at some length with the patient. He seems to understand the importance of following through with recommendations now.  Recommendations:  EGD with possible esophageal dilation and colonoscopy  for high-risk screening/change in bowel habits in the near future.The risks, benefits, limitations, imponderables and alternatives regarding both EGD and colonoscopy have been reviewed with the patient. Questions have been answered. All parties agreeable.    Notice: This dictation was prepared with Dragon dictation along with smaller phrase technology. Any transcriptional errors that result from this process are unintentional and may not be corrected upon review.

## 2013-12-23 ENCOUNTER — Encounter (HOSPITAL_COMMUNITY): Payer: Self-pay | Admitting: Pharmacy Technician

## 2014-01-09 ENCOUNTER — Ambulatory Visit (HOSPITAL_COMMUNITY)
Admission: RE | Admit: 2014-01-09 | Discharge: 2014-01-09 | Disposition: A | Discharge status: Home / Self Care | Payer: Self-pay | Source: Ambulatory Visit | Attending: Internal Medicine | Admitting: Internal Medicine

## 2014-01-09 ENCOUNTER — Encounter (HOSPITAL_COMMUNITY)
Admission: RE | Disposition: A | Discharge status: Home / Self Care | Payer: Self-pay | Source: Ambulatory Visit | Attending: Internal Medicine

## 2014-01-09 ENCOUNTER — Telehealth: Payer: Self-pay

## 2014-01-09 ENCOUNTER — Encounter (HOSPITAL_COMMUNITY): Payer: Self-pay

## 2014-01-09 DIAGNOSIS — Z86010 Personal history of colonic polyps: Secondary | ICD-10-CM

## 2014-01-09 DIAGNOSIS — R1319 Other dysphagia: Secondary | ICD-10-CM

## 2014-01-09 DIAGNOSIS — R198 Other specified symptoms and signs involving the digestive system and abdomen: Secondary | ICD-10-CM

## 2014-01-09 DIAGNOSIS — K2100 Gastro-esophageal reflux disease with esophagitis, without bleeding: Secondary | ICD-10-CM | POA: Insufficient documentation

## 2014-01-09 DIAGNOSIS — K219 Gastro-esophageal reflux disease without esophagitis: Secondary | ICD-10-CM

## 2014-01-09 DIAGNOSIS — R194 Change in bowel habit: Secondary | ICD-10-CM

## 2014-01-09 DIAGNOSIS — D126 Benign neoplasm of colon, unspecified: Secondary | ICD-10-CM | POA: Insufficient documentation

## 2014-01-09 DIAGNOSIS — I10 Essential (primary) hypertension: Secondary | ICD-10-CM | POA: Insufficient documentation

## 2014-01-09 DIAGNOSIS — Z91199 Patient's noncompliance with other medical treatment and regimen due to unspecified reason: Secondary | ICD-10-CM | POA: Insufficient documentation

## 2014-01-09 DIAGNOSIS — Z79899 Other long term (current) drug therapy: Secondary | ICD-10-CM | POA: Insufficient documentation

## 2014-01-09 DIAGNOSIS — R109 Unspecified abdominal pain: Secondary | ICD-10-CM | POA: Insufficient documentation

## 2014-01-09 DIAGNOSIS — F17200 Nicotine dependence, unspecified, uncomplicated: Secondary | ICD-10-CM | POA: Insufficient documentation

## 2014-01-09 DIAGNOSIS — G8929 Other chronic pain: Secondary | ICD-10-CM | POA: Insufficient documentation

## 2014-01-09 DIAGNOSIS — R1310 Dysphagia, unspecified: Secondary | ICD-10-CM | POA: Insufficient documentation

## 2014-01-09 DIAGNOSIS — Z1211 Encounter for screening for malignant neoplasm of colon: Secondary | ICD-10-CM | POA: Insufficient documentation

## 2014-01-09 DIAGNOSIS — Z800 Family history of malignant neoplasm of digestive organs: Secondary | ICD-10-CM | POA: Insufficient documentation

## 2014-01-09 DIAGNOSIS — K449 Diaphragmatic hernia without obstruction or gangrene: Secondary | ICD-10-CM | POA: Insufficient documentation

## 2014-01-09 DIAGNOSIS — Z9119 Patient's noncompliance with other medical treatment and regimen: Secondary | ICD-10-CM | POA: Insufficient documentation

## 2014-01-09 HISTORY — PX: COLONOSCOPY: SHX5424

## 2014-01-09 HISTORY — PX: ESOPHAGOGASTRODUODENOSCOPY: SHX5428

## 2014-01-09 SURGERY — COLONOSCOPY
Anesthesia: Moderate Sedation

## 2014-01-09 MED ORDER — MIDAZOLAM HCL 5 MG/5ML IJ SOLN
INTRAMUSCULAR | Status: DC | PRN
  Administered 2014-01-09 (×2): 2 mg via INTRAVENOUS
  Administered 2014-01-09 (×2): 1 mg via INTRAVENOUS

## 2014-01-09 MED ORDER — MIDAZOLAM HCL 5 MG/5ML IJ SOLN
INTRAMUSCULAR | Status: AC
  Filled 2014-01-09: qty 10

## 2014-01-09 MED ORDER — LIDOCAINE VISCOUS 2 % MT SOLN
OROMUCOSAL | Status: AC
  Filled 2014-01-09: qty 15

## 2014-01-09 MED ORDER — MEPERIDINE HCL 100 MG/ML IJ SOLN
INTRAMUSCULAR | Status: AC
  Filled 2014-01-09: qty 2

## 2014-01-09 MED ORDER — SODIUM CHLORIDE 0.9 % IV SOLN
INTRAVENOUS | Status: DC
  Administered 2014-01-09: 07:00:00 via INTRAVENOUS

## 2014-01-09 MED ORDER — ONDANSETRON HCL 4 MG/2ML IJ SOLN
INTRAMUSCULAR | Status: AC
  Filled 2014-01-09: qty 2

## 2014-01-09 MED ORDER — ONDANSETRON HCL 4 MG/2ML IJ SOLN
INTRAMUSCULAR | Status: DC | PRN
  Administered 2014-01-09: 4 mg via INTRAVENOUS

## 2014-01-09 MED ORDER — STERILE WATER FOR IRRIGATION IR SOLN
Status: DC | PRN
  Administered 2014-01-09: 07:00:00

## 2014-01-09 MED ORDER — MEPERIDINE HCL 100 MG/ML IJ SOLN
INTRAMUSCULAR | Status: DC | PRN
  Administered 2014-01-09: 25 mg via INTRAVENOUS
  Administered 2014-01-09 (×2): 50 mg via INTRAVENOUS

## 2014-01-09 MED ORDER — LIDOCAINE VISCOUS 2 % MT SOLN
OROMUCOSAL | Status: DC | PRN
  Administered 2014-01-09 (×2): 2 mL via OROMUCOSAL

## 2014-01-09 NOTE — H&P (View-Only) (Signed)
Primary Care Physician:  Raiford Simmonds., PA-C Primary Gastroenterologist:  Dr. Gala Romney  Pre-Procedure History & Physical: HPI:  DAILY [name] is a 43 y.o. male here for followup of chronic abdominal pain, positive family history colon cancer, poorly controlled GERD and recurrent dysphagia.  Patient admits global noncompliance with [place] and primary care provider. He was to undergo a colonoscopy and EGD over a year ago but has not followed up. He continues to have abdominal pain in the setting of constipation. Not taking Linzess.   No PPI. Responded to empiric dilation previously. Erosive reflux esophagitis on prior EGD. Sister succumbed to colon cancer in her 7s.  Past Medical History  Diagnosis Date  . Hypertension   . Acid reflux   . Hiatal hernia     Past Surgical History  Procedure Laterality Date  . Esophagogastroduodenoscopy  10/04/2007    RMR: Distal esophageal erosions consistent with erosive reflux esophagitis, patulous gastroesophageal junction status post passage of a  Maloney dilator, 19 [place].  Otherwise, unremarkable esophagus.  Hiatal hernia.  Otherwise normal stomach.  Bulbar erosion  . Colonoscopy  01/10/2009      RMR: Normal rectum, normal colon; repeat in 2015 due to [place] of colon cancer    Prior to Admission medications   Medication Sig Start Date End Date Taking? Authorizing Provider  naphazoline (CLEAR EYES) 0.012 % ophthalmic solution Place 2 drops into both eyes daily as needed (for dry/red eye relief).    Yes Historical Provider, MD  omeprazole (PRILOSEC) 20 MG capsule Take 20 mg by mouth daily. As needed   Yes Historical Provider, MD  lisinopril-hydrochlorothiazide (PRINZIDE,ZESTORETIC) 10-12.5 MG per tablet Take 1 tablet by mouth daily. 05/13/13   Janice Norrie, MD    Allergies as of 12/19/2013  . (No Known Allergies)    Family History  Problem Relation Age of Onset  . Colon cancer Sister     passed away from colon cancer, in her 72s    History    Social History  . Marital Status: Single    Spouse Name: N/A    Number of Children: N/A  . Years of Education: N/A   Occupational History  . 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 Smoking status: Current Every Day Smoker    Types: Cigarettes  . Smokeless tobacco: Not on file     Comment: smokes 10 cigarettes daily  . Alcohol Use: Yes     Comment: occasional/wine on the weekends  . Drug Use: No  . Sexual Activity: Not on file   Other Topics Concern  . Not on file   Social History Narrative  . No narrative on file    Review of Systems: See HPI, otherwise negative ROS  Physical Exam: BP 150/95  Pulse 98  Temp(Src) 99.2 F (37.3 C) (Oral)  Ht 5\' 9"  (1.753 m)  Wt 247 lb 9.6 oz (112.311 kg)  BMI 36.55 kg/m2 General:   Alert,  Well-developed, well-nourished, pleasant and cooperative in NAD Skin:  Intact without significant lesions or rashes. Eyes:  Sclera clear, no icterus.   Conjunctiva pink. Ears:  Normal auditory acuity. Nose:  No deformity, discharge,  or lesions. Mouth:  No deformity or lesions. Neck:  Supple; no masses or thyromegaly. No significant cervical adenopathy. Lungs:  Clear throughout to auscultation.   No wheezes, crackles, or rhonchi. No acute distress. Heart:  Regular rate and rhythm; no murmurs, clicks, rubs,  or  gallops. Abdomen: Obese. Non-distended, normal bowel sounds.  Soft and nontender without appreciable mass or hepatosplenomegaly.  Pulses:  Normal pulses noted. Extremities:  Without clubbing or edema.  Impression:  Noncompliant 43 year old gentleman with change in bowel habits  -  over due for high risk screening colonoscopy. Poorly controlled GERD with recurrent esophageal dysphagia.  Discussed compliance issues at some length with the patient. He seems to understand the importance of following through with recommendations now.  Recommendations:  EGD with possible esophageal dilation and colonoscopy  for high-risk screening/change in bowel habits in the near future.The risks, benefits, limitations, imponderables and alternatives regarding both EGD and colonoscopy have been reviewed with the patient. Questions have been answered. All parties agreeable.    Notice: This dictation was prepared with Dragon dictation along with smaller phrase technology. Any transcriptional errors that result from this process are unintentional and may not be corrected upon review.

## 2014-01-09 NOTE — Op Note (Signed)
[place], [id number]   COLONOSCOPY PROCEDURE REPORT  PATIENT: Christopher, [name].  MR#:         [id number] BIRTHDATE: 11-Feb-1971 , 42  yrs. old GENDER: Male ENDOSCOPIST: R.  Garfield Cornea, MD FACP FACG REFERRED BY:  Royce Macadamia, [place] PROCEDURE DATE:  01/09/2014 PROCEDURE:     Colonoscopy with biopsy and snare polypectomy  INDICATIONS: positive family history colon cancer; high risk screening  INFORMED CONSENT:  The risks, benefits, alternatives and imponderables including but not limited to bleeding, perforation as well as the possibility of a missed lesion have been reviewed.  The potential for biopsy, lesion removal, etc. have also been discussed.  Questions have been answered.  All parties agreeable. Please see the history and physical in the medical record for more information.  MEDICATIONS: Demerol 125 mg IV and Versed 6 mg IV in divided doses. Cetacaine spray. Zofran 4 mg IV  DESCRIPTION OF PROCEDURE:  After a digital rectal exam was performed, the EC-[id number] ([id number])  colonoscope was advanced from the anus through the rectum and colon to the area of the cecum, ileocecal valve and appendiceal orifice.  The cecum was deeply intubated.  These structures were well-seen and photographed for the record.  From the level of the cecum and ileocecal valve, the scope was slowly and cautiously withdrawn.  The mucosal surfaces were carefully surveyed utilizing scope tip deflection to facilitate fold flattening as needed.  The scope was pulled down into the rectum where a thorough examination including retroflexion was performed.    FINDINGS:  Adequate preparation. Rectum normal.  (1) 1 cm pedunculated polyp in the mid descending segment; (1) diminutive polyp at the splenic flexure; otherwise, the remainder of colonic mucosa appeared normal.  THERAPEUTIC / DIAGNOSTIC MANEUVERS PERFORMED:  The above-mentioned polyps were cold biopsy  removed and hot snare removed, respectively.  COMPLICATIONS: none  CECAL WITHDRAWAL TIME:  17 minutes  IMPRESSION:  Multiple colonic polyps-removed as described above  RECOMMENDATIONS: Followup on pathology. See EGD report.   _______________________________ eSigned:  R. Garfield Cornea, MD FACP [facility] 01/09/2014 8:25 AM   CC:

## 2014-01-09 NOTE — Interval H&P Note (Signed)
History and Physical Interval Note:  01/09/2014 7:34 AM  [name]  has presented today for surgery, with the diagnosis of CHANGE IN BOWEL HABITS, FAMILY HISTORY OF COLON CANCER, DYSPHAGIA , GERD  The various methods of treatment have been discussed with the patient and family. After consideration of risks, benefits and other options for treatment, the patient has consented to  Procedure(s) with comments: COLONOSCOPY (N/A) - 730 ESOPHAGOGASTRODUODENOSCOPY (EGD) (N/A) as a surgical intervention .  The patient's history has been reviewed, patient examined, no change in status, stable for surgery.  I have reviewed the patient's chart and labs.  Questions were answered to the patient's satisfaction.     Anders Hohmann  No change. EGD with esophageal dilation as per record colonoscopy as well.The risks, benefits, limitations, imponderables and alternatives regarding both EGD and colonoscopy have been reviewed with the patient. Questions have been answered. All parties agreeable.

## 2014-01-09 NOTE — Op Note (Signed)
[place], [id number]   ENDOSCOPY PROCEDURE REPORT  PATIENT: [name], [name].  MR#: [id number] BIRTHDATE: 1970-12-25 , 42  yrs. old GENDER: Male ENDOSCOPIST: R.  Garfield Cornea, MD [facility] REFERRED BY:  Royce Macadamia, PA PROCEDURE DATE:  01/09/2014 PROCEDURE:     Diagnostic EGD  INDICATIONS:     GERD symptoms; esophageal dysphagia  INFORMED CONSENT:   The risks, benefits, limitations, alternatives and imponderables have been discussed.  The potential for biopsy, esophogeal dilation, etc. have also been reviewed.  Questions have been answered.  All parties agreeable.  Please see the history and physical in the medical record for more information.  MEDICATIONS:   Versed 5 mg IV and Demerol 125 mg IV in divided doses. Xylocaine gel orally. Zofran 4 mg IV.  DESCRIPTION OF PROCEDURE:   The EG-[id number] ([id number])  endoscope was introduced through the mouth and advanced to the second portion of the duodenum without difficulty or limitations.  The mucosal surfaces were surveyed very carefully during advancement of the scope and upon withdrawal.  Retroflexion view of the proximal stomach and esophagogastric junction was performed.      FINDINGS: distal esophageal erosions circumferentially and longitudinally coming up  2 cm into the distal esophagus from the EG junction. The esophagus was patent throughout its course. No Barrett's esophagus. No evidence of tumor. Stomach empty. Small hiatal hernia.   Normal gastric mucosa. Patent Pylorus. Normal first and second portion of the duodenum. As the procedures was pbeing performed, patient heaved. This was associated with some disruption of mucosa at the GE junction producing some self-limited bleeding. I went back and reinspected the GE junction further again, tubular esophagus patent throughout its course.  THERAPEUTIC / DIAGNOSTIC MANEUVERS PERFORMED:  none. No  dilation needed.   COMPLICATIONS:  None  IMPRESSION:  Erosive reflux esophagitis. Small hiatal hernia.  RECOMMENDATIONS:  Patient needs to resume a substantial GERD regimen. He's been noncompliant with medications. We'll begin Dexilant 60 mg daily. He is to go by my office for free samples.  Office visit with [place] in 3 months. See colonoscopy report.    _______________________________ R. Garfield Cornea, MD FACP [facility] eSigned:  R. Garfield Cornea, MD FACP [facility] 01/09/2014 7:59 AM     CC:  PATIENT NAME:  [name], [name]. MR#: [id number]

## 2014-01-09 NOTE — Telephone Encounter (Signed)
Dr. Gala Romney did procedure on pt this AM and sent him by the office for samples of [place]. Dexilant 60 mg #20 given to take one tablet daily.

## 2014-01-09 NOTE — Discharge Instructions (Signed)
Colonoscopy Discharge Instructions  Read the instructions outlined below and refer to this sheet in the next few weeks. These discharge instructions provide you with general information on caring for yourself after you leave the hospital. Your doctor may also give you specific instructions. While your treatment has been planned according to the most current medical practices available, unavoidable complications occasionally occur. If you have any problems or questions after discharge, call Dr. Gala Romney at [phone]. ACTIVITY  You may resume your regular activity, but move at a slower pace for the next 24 hours.   Take frequent rest periods for the next 24 hours.   Walking will help get rid of the air and reduce the bloated feeling in your belly (abdomen).   No driving for 24 hours (because of the medicine (anesthesia) used during the test).    Do not sign any important legal documents or operate any machinery for 24 hours (because of the anesthesia used during the test).  NUTRITION  Drink plenty of fluids.   You may resume your normal diet as instructed by your doctor.   Begin with a light meal and progress to your normal diet. Heavy or fried foods are harder to digest and may make you feel sick to your stomach (nauseated).   Avoid alcoholic beverages for 24 hours or as instructed.  MEDICATIONS  You may resume your normal medications unless your doctor tells you otherwise.  WHAT YOU CAN EXPECT TODAY  Some feelings of bloating in the abdomen.   Passage of more gas than usual.   Spotting of blood in your stool or on the toilet paper.  IF YOU HAD POLYPS REMOVED DURING THE COLONOSCOPY:  No aspirin products for 7 days or as instructed.   No alcohol for 7 days or as instructed.   Eat a soft diet for the next 24 hours.  FINDING OUT THE RESULTS OF YOUR TEST Not all test results are available during your visit. If your test results are not back during the visit, make an appointment  with your caregiver to find out the results. Do not assume everything is normal if you have not heard from your caregiver or the medical facility. It is important for you to follow up on all of your test results.  SEEK IMMEDIATE MEDICAL ATTENTION IF:  You have more than a spotting of blood in your stool.   Your belly is swollen (abdominal distention).   You are nauseated or vomiting.   You have a temperature over 101.   You have abdominal pain or discomfort that is severe or gets worse throughout the day.  EGD Discharge instructions Please read the instructions outlined below and refer to this sheet in the next few weeks. These discharge instructions provide you with general information on caring for yourself after you leave the hospital. Your doctor may also give you specific instructions. While your treatment has been planned according to the most current medical practices available, unavoidable complications occasionally occur. If you have any problems or questions after discharge, please call your doctor. ACTIVITY You may resume your regular activity but move at a slower pace for the next 24 hours.  Take frequent rest periods for the next 24 hours.  Walking will help expel (get rid of) the air and reduce the bloated feeling in your abdomen.  No driving for 24 hours (because of the anesthesia (medicine) used during the test).  You may shower.  Do not sign any important legal documents or operate any machinery  for 24 hours (because of the anesthesia used during the test).  NUTRITION Drink plenty of fluids.  You may resume your normal diet.  Begin with a light meal and progress to your normal diet.  Avoid alcoholic beverages for 24 hours or as instructed by your caregiver.  MEDICATIONS You may resume your normal medications unless your caregiver tells you otherwise.  WHAT YOU CAN EXPECT TODAY You may experience abdominal discomfort such as a feeling of fullness or gas pains.    FOLLOW-UP Your doctor will discuss the results of your test with you.  SEEK IMMEDIATE MEDICAL ATTENTION IF ANY OF THE FOLLOWING OCCUR: Excessive nausea (feeling sick to your stomach) and/or vomiting.  Severe abdominal pain and distention (swelling).  Trouble swallowing.  Temperature over 101 F (37.8 C).  Rectal bleeding or vomiting of blood.   Colonoscopy Discharge Instructions  Read the instructions outlined below and refer to this sheet in the next few weeks. These discharge instructions provide you with general information on caring for yourself after you leave the hospital. Your doctor may also give you specific instructions. While your treatment has been planned according to the most current medical practices available, unavoidable complications occasionally occur. If you have any problems or questions after discharge, call Dr. Gala Romney at [phone]. ACTIVITY You may resume your regular activity, but move at a slower pace for the next 24 hours.  Take frequent rest periods for the next 24 hours.  Walking will help get rid of the air and reduce the bloated feeling in your belly (abdomen).  No driving for 24 hours (because of the medicine (anesthesia) used during the test).   Do not sign any important legal documents or operate any machinery for 24 hours (because of the anesthesia used during the test).  NUTRITION Drink plenty of fluids.  You may resume your normal diet as instructed by your doctor.  Begin with a light meal and progress to your normal diet. Heavy or fried foods are harder to digest and may make you feel sick to your stomach (nauseated).  Avoid alcoholic beverages for 24 hours or as instructed.  MEDICATIONS You may resume your normal medications unless your doctor tells you otherwise.  WHAT YOU CAN EXPECT TODAY Some feelings of bloating in the abdomen.  Passage of more gas than usual.  Spotting of blood in your stool or on the toilet paper.  IF YOU HAD POLYPS REMOVED  DURING THE COLONOSCOPY: No aspirin products for 7 days or as instructed.  No alcohol for 7 days or as instructed.  Eat a soft diet for the next 24 hours.  FINDING OUT THE RESULTS OF YOUR TEST Not all test results are available during your visit. If your test results are not back during the visit, make an appointment with your caregiver to find out the results. Do not assume everything is normal if you have not heard from your caregiver or the medical facility. It is important for you to follow up on all of your test results.  SEEK IMMEDIATE MEDICAL ATTENTION IF: You have more than a spotting of blood in your stool.  Your belly is swollen (abdominal distention).  You are nauseated or vomiting.  You have a temperature over 101.  You have abdominal pain or discomfort that is severe or gets worse throughout the day.    GERD and polyp information provided  Begin Dexilant 60 mg daily-go by my office for a three-week supply of samples  Stop Prilosec  Office visit with  [place] in 3 months  Further recommendations to follow pending review of pathology report Colon Polyps Polyps are lumps of extra tissue growing inside the body. Polyps can grow in the large intestine (colon). Most colon polyps are noncancerous (benign). However, some colon polyps can become cancerous over time. Polyps that are larger than a pea may be harmful. To be safe, caregivers remove and test all polyps. CAUSES  Polyps form when mutations in the genes cause your cells to grow and divide even though no more tissue is needed. RISK FACTORS There are a number of risk factors that can increase your chances of getting colon polyps. They include:  Being older than 50 years.  Family history of colon polyps or colon cancer.  Long-term colon diseases, such as colitis or Crohn disease.  Being overweight.  Smoking.  Being inactive.  Drinking too much alcohol. SYMPTOMS  Most small polyps do not cause symptoms. If symptoms are  present, they may include:  Blood in the stool. The stool may look dark red or black.  Constipation or diarrhea that lasts longer than 1 week. DIAGNOSIS People often do not know they have polyps until their caregiver finds them during a regular checkup. Your caregiver can use 4 tests to check for polyps:  Digital rectal exam. The caregiver wears gloves and feels inside the rectum. This test would find polyps only in the rectum.  Barium enema. The caregiver puts a liquid called barium into your rectum before taking X-rays of your colon. Barium makes your colon look white. Polyps are dark, so they are easy to see in the X-ray pictures.  Sigmoidoscopy. A thin, flexible tube (sigmoidoscope) is placed into your rectum. The sigmoidoscope has a light and tiny camera in it. The caregiver uses the sigmoidoscope to look at the last third of your colon.  Colonoscopy. This test is like sigmoidoscopy, but the caregiver looks at the entire colon. This is the most common method for finding and removing polyps. TREATMENT  Any polyps will be removed during a sigmoidoscopy or colonoscopy. The polyps are then tested for cancer. PREVENTION  To help lower your risk of getting more colon polyps:  Eat plenty of fruits and vegetables. Avoid eating fatty foods.  Do not smoke.  Avoid drinking alcohol.  Exercise every day.  Lose weight if recommended by your caregiver.  Eat plenty of calcium and folate. Foods that are rich in calcium include milk, cheese, and broccoli. Foods that are rich in folate include chickpeas, kidney beans, and spinach. HOME CARE INSTRUCTIONS Keep all follow-up appointments as directed by your caregiver. You may need periodic exams to check for polyps. SEEK MEDICAL CARE IF: You notice bleeding during a bowel movement.  Gastroesophageal Reflux Disease, Adult Gastroesophageal reflux disease (GERD) happens when acid from your stomach flows up into the esophagus. When acid comes in  contact with the esophagus, the acid causes soreness (inflammation) in the esophagus. Over time, GERD may create small holes (ulcers) in the lining of the esophagus. CAUSES   Increased body weight. This puts pressure on the stomach, making acid rise from the stomach into the esophagus.  Smoking. This increases acid production in the stomach.  Drinking alcohol. This causes decreased pressure in the lower esophageal sphincter (valve or ring of muscle between the esophagus and stomach), allowing acid from the stomach into the esophagus.  Late evening meals and a full stomach. This increases pressure and acid production in the stomach.  A malformed lower esophageal sphincter. Sometimes, no cause  is found. SYMPTOMS   Burning pain in the lower part of the mid-chest behind the breastbone and in the mid-stomach area. This may occur twice a week or more often.  Trouble swallowing.  Sore throat.  Dry cough.  Asthma-like symptoms including chest tightness, shortness of breath, or wheezing. DIAGNOSIS  Your caregiver may be able to diagnose GERD based on your symptoms. In some cases, X-rays and other tests may be done to check for complications or to check the condition of your stomach and esophagus. TREATMENT  Your caregiver may recommend over-the-counter or prescription medicines to help decrease acid production. Ask your caregiver before starting or adding any new medicines.  HOME CARE INSTRUCTIONS   Change the factors that you can control. Ask your caregiver for guidance concerning weight loss, quitting smoking, and alcohol consumption.  Avoid foods and drinks that make your symptoms worse, such as:  Caffeine or alcoholic drinks.  Chocolate.  Peppermint or mint flavorings.  Garlic and onions.  Spicy foods.  Citrus fruits, such as oranges, lemons, or limes.  Tomato-based foods such as sauce, chili, salsa, and pizza.  Fried and fatty foods.  Avoid lying down for the 3 hours  prior to your bedtime or prior to taking a nap.  Eat small, frequent meals instead of large meals.  Wear loose-fitting clothing. Do not wear anything tight around your waist that causes pressure on your stomach.  Raise the head of your bed 6 to 8 inches with wood blocks to help you sleep. Extra pillows will not help.  Only take over-the-counter or prescription medicines for pain, discomfort, or fever as directed by your caregiver.  Do not take aspirin, ibuprofen, or other nonsteroidal anti-inflammatory drugs (NSAIDs). SEEK IMMEDIATE MEDICAL CARE IF:   You have pain in your arms, neck, jaw, teeth, or back.  Your pain increases or changes in intensity or duration.  You develop nausea, vomiting, or sweating (diaphoresis).  You develop shortness of breath, or you faint.  Your vomit is green, yellow, black, or looks like coffee grounds or blood.  Your stool is red, bloody, or black. These symptoms could be signs of other problems, such as heart disease, gastric bleeding, or esophageal bleeding. MAKE SURE YOU:   Understand these instructions.  Will watch your condition.  Will get help right away if you are not doing well or get worse.

## 2014-01-12 ENCOUNTER — Encounter: Payer: Self-pay | Admitting: Internal Medicine

## 2014-01-13 ENCOUNTER — Encounter (HOSPITAL_COMMUNITY): Payer: Self-pay | Admitting: Internal Medicine

## 2014-02-02 ENCOUNTER — Telehealth: Payer: Self-pay | Admitting: Internal Medicine

## 2014-02-02 NOTE — Telephone Encounter (Signed)
PATIENT INQUIRING ABOUT GETTING SAMPLES OF DEXILANT 60MG .  HE CAN NOT AFFORD TO GET PRESCRIPTION FILLED AT THE MOMENT.  PLEASE CALL [phone]

## 2014-02-07 ENCOUNTER — Telehealth: Payer: Self-pay | Admitting: Internal Medicine

## 2014-02-07 NOTE — Telephone Encounter (Signed)
Patient called inquiring about dexilant samples  Please [phone]

## 2014-02-07 NOTE — Telephone Encounter (Signed)
Samples are at the front desk, I have included patient assistance forms for him to fill out to get dexilant for free.

## 2014-02-08 NOTE — Telephone Encounter (Signed)
Pt is aware.  

## 2014-02-08 NOTE — Telephone Encounter (Signed)
Samples are at the front desk with pt assistance forms.

## 2014-04-04 ENCOUNTER — Telehealth: Payer: Self-pay | Admitting: Internal Medicine

## 2014-04-04 NOTE — Telephone Encounter (Signed)
Pt is calling to see if he could get samples of his acid reflux medicine. He does not remember the name of it. Please call [phone]

## 2014-04-05 ENCOUNTER — Encounter: Payer: Self-pay | Admitting: *Deleted

## 2014-04-06 NOTE — Telephone Encounter (Signed)
Gave pt #2 boxes of dexilant and included another copy of the pt assistance forms.

## 2014-04-11 ENCOUNTER — Ambulatory Visit (INDEPENDENT_AMBULATORY_CARE_PROVIDER_SITE_OTHER): Payer: Self-pay | Admitting: Gastroenterology

## 2014-04-11 ENCOUNTER — Encounter: Payer: Self-pay | Admitting: Gastroenterology

## 2014-04-11 VITALS — BP 156/105 | HR 94 | Temp 99.5°F | Ht 69.0 in | Wt 240.0 lb

## 2014-04-11 DIAGNOSIS — Z860101 Personal history of adenomatous and serrated colon polyps: Secondary | ICD-10-CM | POA: Insufficient documentation

## 2014-04-11 DIAGNOSIS — Z86010 Personal history of colonic polyps: Secondary | ICD-10-CM | POA: Insufficient documentation

## 2014-04-11 DIAGNOSIS — K2210 Ulcer of esophagus without bleeding: Secondary | ICD-10-CM | POA: Insufficient documentation

## 2014-04-11 DIAGNOSIS — K208 Other esophagitis: Secondary | ICD-10-CM

## 2014-04-11 NOTE — Assessment & Plan Note (Signed)
Due for surveillance colonoscopy August 2018. Discussed at length with patient need to comply with these recommendations given his family history of colon cancer at a young age and personal history of tubular adenomas.

## 2014-04-11 NOTE — Assessment & Plan Note (Signed)
43 year old gentleman with history of erosive reflux esophagitis complicated by noncompliance. He is unable to afford his medication but unfortunately did not complete patient assistance forms as we have previously recommended. Encouraged him to complete forms as soon as possible so that we can secure medication for him. He has had multiple weeks of lapse of Dexilant due to running out of samples.   Clinically having some flare of GERD now off PPI for 3 weeks. No alarm symptoms.   OV in 6 months.

## 2014-04-11 NOTE — Progress Notes (Signed)
      Primary Care Physician: Raiford Simmonds., PA-C  Primary Gastroenterologist:  Garfield Cornea, MD   Chief Complaint  Patient presents with  . Follow-up    HPI: [name] is a 43 y.o. male here for follow-up visit. On 01/09/2014 he had an EGD for GERD symptoms and dysphagia. Found to have erosive reflux esophagitis, small hiatal hernia. No dilation performed. Noted noncompliance with medication, without insurance. Advised to take Dexilant 60mg  daily. Colonoscopy performed the same day for family history of colon cancer, high risk screening showed 2 colonic polyps which were removed, pathology tubular adenomas.  Out of Dexilant for 3 weeks. Starting to have upper GI symptoms again. Complains of indigestion/gassy. Bowel movements regular. No melena or rectal bleeding. No heartburn. No vomiting. Appetite is good. Previously failed omeprazole, Nexium. Reports taking other PPIs but cannot recall the names.  Out of BP pills.  Current Outpatient Prescriptions  Medication Sig Dispense Refill  . dexlansoprazole (DEXILANT) 60 MG capsule Take 60 mg by mouth daily.    . naphazoline (CLEAR EYES) 0.012 % ophthalmic solution Place 2 drops into both eyes daily as needed (for dry/red eye relief).      No current facility-administered medications for this visit.    Allergies as of 04/11/2014  . (No Known Allergies)    ROS:  General: Negative for anorexia, weight loss, fever, chills, fatigue, weakness. ENT: Negative for hoarseness, difficulty swallowing , nasal congestion. CV: Negative for chest pain, angina, palpitations, dyspnea on exertion, peripheral edema.  Respiratory: Negative for dyspnea at rest, dyspnea on exertion, cough, sputum, wheezing.  GI: See history of present illness. GU:  Negative for dysuria, hematuria, urinary incontinence, urinary frequency, nocturnal urination.  Endo: Negative for unusual weight change.    Physical Examination:   BP 156/105 mmHg  Pulse 94   Temp(Src) 99.5 F (37.5 C) (Oral)  Ht 5\' 9"  (1.753 m)  Wt 240 lb (108.863 kg)  BMI 35.43 kg/m2  General: Well-nourished, well-developed in no acute distress.  Eyes: No icterus. Mouth: Oropharyngeal mucosa moist and pink , no lesions erythema or exudate. Lungs: Clear to auscultation bilaterally.  Heart: Regular rate and rhythm, no murmurs rubs or gallops.  Abdomen: Bowel sounds are normal, nontender, nondistended, no hepatosplenomegaly or masses, no abdominal bruits or hernia , no rebound or guarding.   Extremities: No lower extremity edema. No clubbing or deformities. Neuro: Alert and oriented x 4   Skin: Warm and dry, no jaundice.   Psych: Alert and cooperative, normal mood and affect.

## 2014-04-11 NOTE — Patient Instructions (Signed)
1. Complete patient assistance forms soon as possible and return to our office. 2. Continue Dexilant 60mg  daily. More samples provided today. 3. Call with problems. Otherwise see you back in 6 months.

## 2014-04-13 NOTE — Progress Notes (Signed)
cc'ed to pcp °

## 2014-06-08 ENCOUNTER — Telehealth: Payer: Self-pay | Admitting: *Deleted

## 2014-06-08 NOTE — Telephone Encounter (Signed)
PATIENT WANTING SAMPLES OF DEXILANT AND ALSO NEEDS ANOTHER PAPER TO FILL OUT FOR FINANCIAL ASSISTANCE.  PLEASE ADVISE

## 2014-06-09 NOTE — Telephone Encounter (Signed)
#  3 boxes of dexilant and pt assistance paperwork at the front desk.

## 2014-10-10 ENCOUNTER — Telehealth: Payer: Self-pay | Admitting: Internal Medicine

## 2014-10-10 ENCOUNTER — Ambulatory Visit: Payer: Self-pay | Admitting: Gastroenterology

## 2014-10-10 ENCOUNTER — Encounter: Payer: Self-pay | Admitting: Gastroenterology

## 2014-10-10 NOTE — Telephone Encounter (Signed)
PATIENT WAS A NO SHOW AND LETTER WAS SENT  °

## 2014-10-27 ENCOUNTER — Telehealth: Payer: Self-pay | Admitting: Gastroenterology

## 2014-10-27 ENCOUNTER — Ambulatory Visit: Payer: Self-pay | Admitting: Gastroenterology

## 2014-10-27 NOTE — Telephone Encounter (Signed)
noted 

## 2014-10-27 NOTE — Telephone Encounter (Signed)
Pt was a no show

## 2014-10-27 NOTE — Telephone Encounter (Signed)
PT came in and requested Dexilant samples until Mon. ( He was rescheduled) I gave him one box of Dexilant 60 mg #5 tablets.

## 2014-10-30 ENCOUNTER — Ambulatory Visit (INDEPENDENT_AMBULATORY_CARE_PROVIDER_SITE_OTHER): Payer: Self-pay | Admitting: Gastroenterology

## 2014-10-30 ENCOUNTER — Telehealth: Payer: Self-pay

## 2014-10-30 ENCOUNTER — Encounter (INDEPENDENT_AMBULATORY_CARE_PROVIDER_SITE_OTHER): Payer: Self-pay

## 2014-10-30 ENCOUNTER — Encounter: Payer: Self-pay | Admitting: Gastroenterology

## 2014-10-30 VITALS — BP 148/99 | HR 95 | Temp 98.1°F | Ht 69.0 in | Wt 231.2 lb

## 2014-10-30 DIAGNOSIS — K2100 Gastro-esophageal reflux disease with esophagitis, without bleeding: Secondary | ICD-10-CM

## 2014-10-30 DIAGNOSIS — R1013 Epigastric pain: Secondary | ICD-10-CM

## 2014-10-30 MED ORDER — ONDANSETRON HCL 4 MG PO TABS: 4.0000 mg | ORAL_TABLET | Freq: Three times a day (TID) | ORAL | Status: DC | PRN

## 2014-10-30 MED ORDER — DEXLANSOPRAZOLE 60 MG PO CPDR: 60.0000 mg | DELAYED_RELEASE_CAPSULE | Freq: Every day | ORAL | Status: DC

## 2014-10-30 NOTE — Progress Notes (Signed)
Referring Provider: Raiford Simmonds., PA-C Primary Care Physician:  Raiford Simmonds., PA-C  Primary GI: Dr. Gala Romney   Chief Complaint  Patient presents with  . Follow-up    HPI:   [name] is a 44 y.o. male presenting today with a history of erosive reflux esophagitis, tubular adenomas. Last seen Nov 2015. Last EGD and colonoscopy Aug 2015. Due for colonoscopy surveillance in 2018.   Ran out of Dexilant, with last dose today. States he is nauseated all the time. No vomiting. Sometimes feels like he has to vomit but never can. Sometimes after showering or drinking water, feels like it wants to come up. Even sticking a toothpick in his mouth. Has ugly taste in his mouth. States he had been out of Dexilant for awhile and had to get samples from our office a few days ago. Notes intermittent abdominal pain, sometimes radiating up into chest. Feels groggy. Wonders if it sinus-related. States he has cervical neck pain, shoots up his head. Doesn't have a PCP currently. Occasional dizziness, comes and goes. Ran out of BP medication, off for about a week. Even while taking BP medication, still would feel dizzy and foggy.   Past Medical History  Diagnosis Date  . Hypertension   . Acid reflux   . Hiatal hernia     Past Surgical History  Procedure Laterality Date  . Esophagogastroduodenoscopy  10/04/2007    RMR: Distal esophageal erosions consistent with erosive reflux esophagitis, patulous gastroesophageal junction status post passage of a  [place] dilator, 32 [place].  Otherwise, unremarkable esophagus.  Hiatal hernia.  Otherwise normal stomach.  Bulbar erosion  . Colonoscopy  01/10/2009      RMR: Normal rectum, normal colon; repeat in 2015 due to [place] of colon cancer  . Colonoscopy N/A 01/09/2014    [id number] colonic polyps-removed as described  . Esophagogastroduodenoscopy N/A 01/09/2014    Erosive reflux esophagitis. Small hiatal hernia    Current Outpatient Prescriptions    Medication Sig Dispense Refill  . dexlansoprazole (DEXILANT) 60 MG capsule Take 60 mg by mouth daily.    . naphazoline (CLEAR EYES) 0.012 % ophthalmic solution Place 2 drops into both eyes daily as needed (for dry/red eye relief).      No current facility-administered medications for this visit.    Allergies as of 10/30/2014  . (No Known Allergies)    Family History  Problem Relation Age of Onset  . Colon cancer Sister     passed away from colon cancer, in her 53s    History   Social History  . Marital Status: Single    Spouse Name: N/A  . Number of Children: N/A  . Years of Education: N/A   Occupational History  . Art gallery manager in [place] History Main Topics  . Smoking status: Current Every Day Smoker    Types: Cigarettes  . Smokeless tobacco: Not on file     Comment: smokes 10 cigarettes daily  . Alcohol Use: Yes     Comment: occasional/wine on the weekends  . Drug Use: No  . Sexual Activity: Not on file   Other Topics Concern  . None   Social History Narrative    Review of Systems: As mentioned in HPI  Physical Exam: BP 148/99 mmHg  Pulse 95  Temp(Src) 98.1 F (36.7 C)  Ht 5\' 9"  (1.753 m)  Wt 231 lb 3.2 oz (104.872 kg)  BMI 34.13 kg/m2 General:  Alert and oriented. No distress noted. Pleasant and cooperative.  Head:  Normocephalic and atraumatic. Eyes:  Conjuctiva clear without scleral icterus. Mouth:  Oral mucosa pink and moist. Good dentition. No lesions. Abdomen:  +BS, soft, mild TTP upper abdomen and non-distended. No rebound or guarding. No HSM or masses noted. Msk:  Symmetrical without gross deformities. Normal posture. Extremities:  Without edema. Neurologic:  Alert and  oriented x4;  grossly normal neurologically. Skin:  Intact without significant lesions or rashes. Psych:  Alert and cooperative. Normal mood and affect.

## 2014-10-30 NOTE — Patient Instructions (Signed)
We are trying to get you into the [facility] sooner.   I have ordered an ultrasound of your gallbladder.   Take Dexilant once each day. I have provided samples and the prescription for when your insurance kicks in. I have also sent a nausea medication to the pharmacy.  We will see you back in 3 months!

## 2014-10-30 NOTE — Telephone Encounter (Signed)
Per Laban Emperor, NP, I called the [facility] Dept [phone]) and pt has appt tomorrow at 3:30 PM to check his BS, BP and headaches.  PT is aware and knows to take proof of income.

## 2014-10-31 NOTE — Progress Notes (Signed)
CC'ED TO PCP 

## 2014-10-31 NOTE — Assessment & Plan Note (Signed)
44 year old male with history of non-compliance secondary to lack of insurance coverage. Likely uncontrolled GERD secondary to lack of PPI recently. Chronic nausea, intermittent abdominal pain. Gallbladder remains in situ but less likely biliary etiology in setting of known chronic GERD. Restart Dexilant, provided samples and provided prescription for when insurance coverage is present. US abdomen at patient's request. 3 month follow-up. As of note, hypertensive today. No medications currently due to running out. Will try to facilitate an urgent appt with the [facility] Dept.

## 2014-11-01 ENCOUNTER — Ambulatory Visit (HOSPITAL_COMMUNITY)
Admission: RE | Admit: 2014-11-01 | Discharge: 2014-11-01 | Disposition: A | Discharge status: Home / Self Care | Payer: Self-pay | Source: Ambulatory Visit | Attending: Gastroenterology | Admitting: Gastroenterology

## 2014-11-01 DIAGNOSIS — K769 Liver disease, unspecified: Secondary | ICD-10-CM | POA: Insufficient documentation

## 2014-11-01 DIAGNOSIS — R1013 Epigastric pain: Secondary | ICD-10-CM

## 2014-11-01 DIAGNOSIS — R1011 Right upper quadrant pain: Secondary | ICD-10-CM | POA: Insufficient documentation

## 2014-11-03 NOTE — Progress Notes (Signed)
Quick Note:  Gallbladder looks good. HOWEVER, he has a small liver lesion in the left lobe. Unable to characterize on here. Let's get an MRI of LIVER lesion for further evaluation. Return in 6 weeks. ______

## 2014-11-07 ENCOUNTER — Other Ambulatory Visit: Payer: Self-pay

## 2014-11-07 DIAGNOSIS — K769 Liver disease, unspecified: Secondary | ICD-10-CM

## 2014-11-07 NOTE — Progress Notes (Signed)
Quick Note:  LMOM that we have a very important message for him to call. ______

## 2014-11-21 ENCOUNTER — Ambulatory Visit (HOSPITAL_COMMUNITY)
Admission: RE | Admit: 2014-11-21 | Discharge: 2014-11-21 | Disposition: A | Discharge status: Home / Self Care | Payer: Self-pay | Source: Ambulatory Visit | Attending: Gastroenterology | Admitting: Gastroenterology

## 2014-11-21 ENCOUNTER — Telehealth: Payer: Self-pay

## 2014-11-21 DIAGNOSIS — K769 Liver disease, unspecified: Secondary | ICD-10-CM

## 2014-11-21 DIAGNOSIS — K7689 Other specified diseases of liver: Secondary | ICD-10-CM | POA: Insufficient documentation

## 2014-11-21 LAB — POCT I-STAT CREATININE: Creatinine, Ser: 1.3 mg/dL — ABNORMAL HIGH (ref 0.61–1.24)

## 2014-11-21 NOTE — Telephone Encounter (Signed)
Pt is scheduled for his MRI on 11/26/14 @ 5:00 pm

## 2014-11-21 NOTE — Progress Notes (Signed)
Quick Note:  LMOM to call. ______ 

## 2014-11-21 NOTE — Telephone Encounter (Signed)
Pt went for his MRI but was unable to fit in the machine to do the MRI. I am going to get him set up at [facility]. Also if we can call in something to help clam him down for the MRI. Please advise

## 2014-11-21 NOTE — Progress Notes (Signed)
Quick Note:  Cr slightly elevated, moreso than baseline. [facility]. Was he seen recently? If so, we need to make them aware of this finding. ______

## 2014-11-22 MED ORDER — LORAZEPAM 0.5 MG PO TABS: 0.5000 mg | ORAL_TABLET | ORAL | Status: DC

## 2014-11-22 NOTE — Addendum Note (Signed)
Addended by: Orvil Feil on: 11/22/2014 11:43 AM   Modules accepted: Orders

## 2014-11-22 NOTE — Telephone Encounter (Signed)
I printed a prescription for Ativan.

## 2014-11-23 NOTE — Telephone Encounter (Signed)
Pt is aware of Rx

## 2014-11-26 ENCOUNTER — Inpatient Hospital Stay: Admission: RE | Admit: 2014-11-26 | Payer: Self-pay | Source: Ambulatory Visit

## 2014-12-03 ENCOUNTER — Inpatient Hospital Stay: Admission: RE | Admit: 2014-12-03 | Payer: Self-pay | Source: Ambulatory Visit

## 2014-12-26 ENCOUNTER — Emergency Department (HOSPITAL_COMMUNITY)
Admission: EM | Admit: 2014-12-26 | Discharge: 2014-12-26 | Disposition: A | Discharge status: Home / Self Care | Payer: Self-pay | Attending: Emergency Medicine | Admitting: Emergency Medicine

## 2014-12-26 ENCOUNTER — Encounter (HOSPITAL_COMMUNITY): Payer: Self-pay | Admitting: Emergency Medicine

## 2014-12-26 DIAGNOSIS — K029 Dental caries, unspecified: Secondary | ICD-10-CM | POA: Insufficient documentation

## 2014-12-26 DIAGNOSIS — I10 Essential (primary) hypertension: Secondary | ICD-10-CM | POA: Insufficient documentation

## 2014-12-26 DIAGNOSIS — K219 Gastro-esophageal reflux disease without esophagitis: Secondary | ICD-10-CM | POA: Insufficient documentation

## 2014-12-26 DIAGNOSIS — K0381 Cracked tooth: Secondary | ICD-10-CM | POA: Insufficient documentation

## 2014-12-26 DIAGNOSIS — Z720 Tobacco use: Secondary | ICD-10-CM | POA: Insufficient documentation

## 2014-12-26 DIAGNOSIS — Z79899 Other long term (current) drug therapy: Secondary | ICD-10-CM | POA: Insufficient documentation

## 2014-12-26 MED ORDER — TRAMADOL HCL 50 MG PO TABS
50.0000 mg | ORAL_TABLET | Freq: Once | ORAL | Status: AC
  Administered 2014-12-26: 50 mg via ORAL
  Filled 2014-12-26: qty 1

## 2014-12-26 MED ORDER — IBUPROFEN 800 MG PO TABS: 800.0000 mg | ORAL_TABLET | Freq: Three times a day (TID) | ORAL | Status: DC

## 2014-12-26 MED ORDER — PENICILLIN V POTASSIUM 500 MG PO TABS: 500.0000 mg | ORAL_TABLET | Freq: Three times a day (TID) | ORAL | Status: DC

## 2014-12-26 MED ORDER — PENICILLIN V POTASSIUM 500 MG PO TABS
500.0000 mg | ORAL_TABLET | Freq: Once | ORAL | Status: AC
  Administered 2014-12-26: 500 mg via ORAL
  Filled 2014-12-26: qty 1

## 2014-12-26 MED ORDER — IBUPROFEN 800 MG PO TABS: 800.0000 mg | ORAL_TABLET | Freq: Once | ORAL | Status: DC

## 2014-12-26 MED ORDER — TRAMADOL HCL 50 MG PO TABS: 50.0000 mg | ORAL_TABLET | Freq: Four times a day (QID) | ORAL | Status: DC | PRN

## 2014-12-26 NOTE — ED Notes (Signed)
Pt is c/o toothache on the top right  Pt states pain started today  Pt states he took a goody powder around 4pm without relief

## 2014-12-26 NOTE — ED Provider Notes (Signed)
CSN: [id number]   Arrival date & time 12/26/14 2024  History  This chart was scribed for non-physician practitioner, Charlann Lange PA-C , working with Deno Etienne, DO by Altamease Oiler, ED Scribe. This patient was seen in room WTR6/WTR6 and the patient's care was started at 9:14 PM.  Chief Complaint  Patient presents with  . Dental Pain    HPI The history is provided by the patient. No language interpreter was used.   [name] is a 44 y.o. male who presents to the Emergency Department complaining of constant upper right dental pain with onset today. The patient has had a fractured tooth in the upper right portion of his mouth for some time but it did not cause pain until today. He describes the pain as aching and rates it 10/10 in severity. Applying a Goody powder to the tooth provided insufficient pain relief PTA. No fever. Pt is unsure if his face is swollen. A friend is in the waiting room to drive him home.   Past Medical History  Diagnosis Date  . Hypertension   . Acid reflux   . Hiatal hernia     Past Surgical History  Procedure Laterality Date  . Esophagogastroduodenoscopy  10/04/2007    RMR: Distal esophageal erosions consistent with erosive reflux esophagitis, patulous gastroesophageal junction status post passage of a  Maloney dilator, 65 [place].  Otherwise, unremarkable esophagus.  Hiatal hernia.  Otherwise normal stomach.  Bulbar erosion  . Colonoscopy  01/10/2009      RMR: Normal rectum, normal colon; repeat in 2015 due to [place] of colon cancer  . Colonoscopy N/A 01/09/2014    [id number] colonic polyps-removed as described  . Esophagogastroduodenoscopy N/A 01/09/2014    Erosive reflux esophagitis. Small hiatal hernia    Family History  Problem Relation Age of Onset  . Colon cancer Sister     passed away from colon cancer, in her 5s  . Diabetes Father   . Diabetes Other     History  Substance Use Topics  . Smoking status: Current Every Day Smoker    Types:  Cigarettes  . Smokeless tobacco: Not on file     Comment: smokes 10 cigarettes daily  . Alcohol Use: Yes     Comment: occasional/wine on the weekends     Review of Systems  Constitutional: Negative for fever.  HENT: Positive for dental problem.     Home Medications   Prior to Admission medications   Medication Sig Start Date End Date Taking? Authorizing Provider  dexlansoprazole (DEXILANT) 60 MG capsule Take 1 capsule (60 mg total) by mouth daily. 10/30/14  Yes Orvil Feil, NP  naphazoline (CLEAR EYES) 0.012 % ophthalmic solution Place 2 drops into both eyes daily as needed (for dry/red eye relief).    Yes Historical Provider, MD  LORazepam (ATIVAN) 0.5 MG tablet Take 1 tablet (0.5 mg total) by mouth on call. 30 minutes before MRI. Repeat X 1 if needed. Patient not taking: Reported on 12/26/2014 11/22/14   Orvil Feil, NP  ondansetron (ZOFRAN) 4 MG tablet Take 1 tablet (4 mg total) by mouth every 8 (eight) hours as needed for nausea or vomiting. Patient not taking: Reported on 12/26/2014 10/30/14   Orvil Feil, NP    Allergies  Review of patient's allergies indicates no known allergies.  Triage Vitals: BP 156/93 mmHg  Pulse 86  Temp(Src) 98.3 F (36.8 C) (Oral)  Resp 20  Wt 230 lb (104.327 kg)  SpO2 99%  Physical  Exam  Constitutional: He is oriented to person, place, and time. He appears well-developed and well-nourished. No distress.  HENT:  Head: Normocephalic and atraumatic.  Fractured #3 with erosion indicating chronic fracture No visualized abscess Mild right facial swelling Oropharynx benign   Eyes: Conjunctivae and EOM are normal.  Neck: Neck supple. No tracheal deviation present.  Cardiovascular: Normal rate.   Pulmonary/Chest: Effort normal. No respiratory distress.  Musculoskeletal: Normal range of motion.  Neurological: He is alert and oriented to person, place, and time.  Skin: Skin is warm and dry.  Psychiatric: He has a normal mood and affect. His behavior is  normal.  Nursing note and vitals reviewed.   ED Course  Procedures   DIAGNOSTIC STUDIES: Oxygen Saturation is 99% on RA, normal by my interpretation.    COORDINATION OF CARE: 9:19 PM Discussed treatment plan which includes tramadol, ibuprofen, and Veetid with pt at bedside and pt agreed to plan.  Labs Review- Labs Reviewed - No data to display  Imaging Review No results found.  EKG Interpretation None    MDM   Final diagnoses:  None   1. Dental decay  No visualized abscess. Will cover with abx given acute increase to pain in decaying tooth and provide dental clinic resources.  I personally performed the services described in this documentation, which was scribed in my presence. The recorded information has been reviewed and is accurate.       Charlann Lange, PA-C 12/28/14 [place], DO 12/28/14 [phone]

## 2014-12-26 NOTE — Discharge Instructions (Signed)
Dental Care and Dentist Visits Dental care supports good overall health. Regular dental visits can also help you avoid dental pain, bleeding, infection, and other more serious health problems in the future. It is important to keep the mouth healthy because diseases in the teeth, gums, and other oral tissues can spread to other areas of the body. Some problems, such as diabetes, heart disease, and pre-term labor have been associated with poor oral health.  See your dentist every 6 months. If you experience emergency problems such as a toothache or broken tooth, go to the dentist right away. If you see your dentist regularly, you may catch problems early. It is easier to be treated for problems in the early stages.  WHAT TO EXPECT AT A DENTIST VISIT  Your dentist will look for many common oral health problems and recommend proper treatment. At your regular dental visit, you can expect:  Gentle cleaning of the teeth and gums. This includes scraping and polishing. This helps to remove the sticky substance around the teeth and gums (plaque). Plaque forms in the mouth shortly after eating. Over time, plaque hardens on the teeth as tartar. If tartar is not removed regularly, it can cause problems. Cleaning also helps remove stains.  Periodic X-rays. These pictures of the teeth and supporting bone will help your dentist assess the health of your teeth.  Periodic fluoride treatments. Fluoride is a natural mineral shown to help strengthen teeth. Fluoride treatmentinvolves applying a fluoride gel or varnish to the teeth. It is most commonly done in children.  Examination of the mouth, tongue, jaws, teeth, and gums to look for any oral health problems, such as:  Cavities (dental caries). This is decay on the tooth caused by plaque, sugar, and acid in the mouth. It is best to catch a cavity when it is small.  Inflammation of the gums caused by plaque buildup (gingivitis).  Problems with the mouth or malformed  or misaligned teeth.  Oral cancer or other diseases of the soft tissues or jaws. KEEP YOUR TEETH AND GUMS HEALTHY For healthy teeth and gums, follow these general guidelines as well as your dentist's specific advice:  Have your teeth professionally cleaned at the dentist every 6 months.  Brush twice daily with a fluoride toothpaste.  Floss your teeth daily.  Ask your dentist if you need fluoride supplements, treatments, or fluoride toothpaste.  Eat a healthy diet. Reduce foods and drinks with added sugar.  Avoid smoking. TREATMENT FOR ORAL HEALTH PROBLEMS If you have oral health problems, treatment varies depending on the conditions present in your teeth and gums.  Your caregiver will most likely recommend good oral hygiene at each visit.  For cavities, gingivitis, or other oral health disease, your caregiver will perform a procedure to treat the problem. This is typically done at a separate appointment. Sometimes your caregiver will refer you to another dental specialist for specific tooth problems or for surgery. SEEK IMMEDIATE DENTAL CARE IF:  You have pain, bleeding, or soreness in the gum, tooth, jaw, or mouth area.  A permanent tooth becomes loose or separated from the gum socket.  You experience a blow or injury to the mouth or jaw area. Document Released: 01/22/2011 Document Revised: 08/04/2011 Document Reviewed: 01/22/2011 [facility] Patient Information 2015 [place], [place]. This information is not intended to replace advice given to you by your health care provider. Make sure you discuss any questions you have with your health care provider.  Dental Caries Dental caries (also called tooth decay)  is the most common oral disease. It can occur at any age but is more common in children and young adults.  HOW DENTAL CARIES DEVELOPS  The process of decay begins when bacteria and foods (particularly sugars and starches) combine in your mouth to produce plaque. Plaque is a  substance that sticks to the hard, outer surface of a tooth (enamel). The bacteria in plaque produce acids that attack enamel. These acids may also attack the root surface of a tooth (cementum) if it is exposed. Repeated attacks dissolve these surfaces and create holes in the tooth (cavities). If left untreated, the acids destroy the other layers of the tooth.  RISK FACTORS  Frequent sipping of sugary beverages.   Frequent snacking on sugary and starchy foods, especially those that easily get stuck in the teeth.   Poor oral hygiene.   Dry mouth.   Substance abuse such as methamphetamine abuse.   Broken or poor-fitting dental restorations.   Eating disorders.   Gastroesophageal reflux disease (GERD).   Certain radiation treatments to the head and neck. SYMPTOMS In the early stages of dental caries, symptoms are seldom present. Sometimes white, chalky areas may be seen on the enamel or other tooth layers. In later stages, symptoms may include:  Pits and holes on the enamel.  Toothache after sweet, hot, or cold foods or drinks are consumed.  Pain around the tooth.  Swelling around the tooth. DIAGNOSIS  Most of the time, dental caries is detected during a regular dental checkup. A diagnosis is made after a thorough medical and dental history is taken and the surfaces of your teeth are checked for signs of dental caries. Sometimes special instruments, such as lasers, are used to check for dental caries. Dental X-ray exams may be taken so that areas not visible to the eye (such as between the contact areas of the teeth) can be checked for cavities.  TREATMENT  If dental caries is in its early stages, it may be reversed with a fluoride treatment or an application of a remineralizing agent at the dental office. Thorough brushing and flossing at home is needed to aid these treatments. If it is in its later stages, treatment depends on the location and extent of tooth destruction:    If a small area of the tooth has been destroyed, the destroyed area will be removed and cavities will be filled with a material such as gold, silver amalgam, or composite resin.   If a large area of the tooth has been destroyed, the destroyed area will be removed and a cap (crown) will be fitted over the remaining tooth structure.   If the center part of the tooth (pulp) is affected, a procedure called a root canal will be needed before a filling or crown can be placed.   If most of the tooth has been destroyed, the tooth may need to be pulled (extracted). HOME CARE INSTRUCTIONS You can prevent, stop, or reverse dental caries at home by practicing good oral hygiene. Good oral hygiene includes:  Thoroughly cleaning your teeth at least twice a day with a toothbrush and dental floss.   Using a fluoride toothpaste. A fluoride mouth rinse may also be used if recommended by your dentist or health care provider.   Restricting the amount of sugary and starchy foods and sugary liquids you consume.   Avoiding frequent snacking on these foods and sipping of these liquids.   Keeping regular visits with a dentist for checkups and cleanings. PREVENTION  Practice good oral hygiene.  Consider a dental sealant. A dental sealant is a coating material that is applied by your dentist to the pits and grooves of teeth. The sealant prevents food from being trapped in them. It may protect the teeth for several years.  Ask about fluoride supplements if you live in a community without fluorinated water or with water that has a low fluoride content. Use fluoride supplements as directed by your dentist or health care provider.  Allow fluoride varnish applications to teeth if directed by your dentist or health care provider. Document Released: 02/01/2002 Document Revised: 09/26/2013 Document Reviewed: 05/14/2012 [facility] Patient Information 2015 [place], [place]. This information is not intended to  replace advice given to you by your health care provider. Make sure you discuss any questions you have with your health care provider.

## 2014-12-26 NOTE — ED Notes (Signed)
Questions r/t dc were denied. Pt is ambulatory and a&ox4 

## 2015-01-30 ENCOUNTER — Encounter: Payer: Self-pay | Admitting: Gastroenterology

## 2015-01-30 ENCOUNTER — Telehealth: Payer: Self-pay | Admitting: Internal Medicine

## 2015-01-30 ENCOUNTER — Ambulatory Visit: Payer: Self-pay | Admitting: Gastroenterology

## 2015-01-30 ENCOUNTER — Encounter: Payer: Self-pay | Admitting: Internal Medicine

## 2015-01-30 NOTE — Telephone Encounter (Signed)
PATIENT WAS A NO SHOW AND LETTER SENT  °

## 2015-02-14 ENCOUNTER — Emergency Department (HOSPITAL_COMMUNITY): Payer: Self-pay

## 2015-02-14 ENCOUNTER — Encounter (HOSPITAL_COMMUNITY): Payer: Self-pay | Admitting: *Deleted

## 2015-02-14 ENCOUNTER — Emergency Department (HOSPITAL_COMMUNITY)
Admission: EM | Admit: 2015-02-14 | Discharge: 2015-02-14 | Disposition: A | Discharge status: Home / Self Care | Payer: Self-pay | Attending: Emergency Medicine | Admitting: Emergency Medicine

## 2015-02-14 DIAGNOSIS — Z79899 Other long term (current) drug therapy: Secondary | ICD-10-CM | POA: Insufficient documentation

## 2015-02-14 DIAGNOSIS — Z720 Tobacco use: Secondary | ICD-10-CM | POA: Insufficient documentation

## 2015-02-14 DIAGNOSIS — R079 Chest pain, unspecified: Secondary | ICD-10-CM | POA: Insufficient documentation

## 2015-02-14 DIAGNOSIS — K219 Gastro-esophageal reflux disease without esophagitis: Secondary | ICD-10-CM | POA: Insufficient documentation

## 2015-02-14 DIAGNOSIS — I10 Essential (primary) hypertension: Secondary | ICD-10-CM | POA: Insufficient documentation

## 2015-02-14 DIAGNOSIS — J209 Acute bronchitis, unspecified: Secondary | ICD-10-CM | POA: Insufficient documentation

## 2015-02-14 DIAGNOSIS — R059 Cough, unspecified: Secondary | ICD-10-CM

## 2015-02-14 DIAGNOSIS — R05 Cough: Secondary | ICD-10-CM

## 2015-02-14 LAB — BASIC METABOLIC PANEL
Anion gap: 8 (ref 5–15)
BUN: 14 mg/dL (ref 6–20)
CO2: 27 mmol/L (ref 22–32)
Calcium: 9.5 mg/dL (ref 8.9–10.3)
Chloride: 103 mmol/L (ref 101–111)
Creatinine, Ser: 1.19 mg/dL (ref 0.61–1.24)
GFR calc Af Amer: >60 mL/min (ref >60)
GFR calc non Af Amer: >60 mL/min (ref >60)
Glucose, Bld: 110 mg/dL — ABNORMAL HIGH (ref 65–99)
Potassium: 4.6 mmol/L (ref 3.5–5.1)
Sodium: 138 mmol/L (ref 135–145)

## 2015-02-14 LAB — CBC
HCT: 47.3 % (ref 39.0–52.0)
Hemoglobin: 15.4 g/dL (ref 13.0–17.0)
MCH: 28.6 pg (ref 26.0–34.0)
MCHC: 32.6 g/dL (ref 30.0–36.0)
MCV: 87.9 fL (ref 78.0–100.0)
Platelets: 267 10*3/uL (ref 150–400)
RBC: 5.38 MIL/uL (ref 4.22–5.81)
RDW: 12.9 % (ref 11.5–15.5)
WBC: 8.5 10*3/uL (ref 4.0–10.5)

## 2015-02-14 LAB — I-STAT TROPONIN, ED: Troponin i, poc: 0 ng/mL (ref 0.00–0.08)

## 2015-02-14 MED ORDER — AZITHROMYCIN 250 MG PO TABS: ORAL_TABLET | ORAL | Status: DC

## 2015-02-14 MED ORDER — HYDROCOD POLST-CPM POLST ER 10-8 MG/5ML PO SUER: 5.0000 mL | Freq: Two times a day (BID) | ORAL | Status: DC | PRN

## 2015-02-14 NOTE — Discharge Instructions (Signed)

## 2015-02-14 NOTE — ED Provider Notes (Signed)
CSN: [id number]     Arrival date & time 02/14/15  1057 History   First MD Initiated Contact with Patient 02/14/15 1501     Chief Complaint  Patient presents with  . URI  . Chest Pain     (Consider location/radiation/quality/duration/timing/severity/associated sxs/prior Treatment) Patient is a 44 y.o. male presenting with URI and chest pain. The history is provided by the patient.  URI Presenting symptoms: cough   Presenting symptoms: no fever   Severity:  Mild Onset quality:  Gradual Duration:  4 days Timing:  Constant Progression:  Unchanged Chronicity:  New Relieved by:  Nothing Worsened by:  Nothing tried Ineffective treatments: Over-the-counter cold remedies. Associated symptoms: no arthralgias, no headaches, no sinus pain and no swollen glands   Chest Pain Associated symptoms: cough   Associated symptoms: no fever, no headache, no shortness of breath and not vomiting     Past Medical History  Diagnosis Date  . Hypertension   . Acid reflux   . Hiatal hernia    Past Surgical History  Procedure Laterality Date  . Esophagogastroduodenoscopy  10/04/2007    RMR: Distal esophageal erosions consistent with erosive reflux esophagitis, patulous gastroesophageal junction status post passage of a  Maloney dilator, 46 [place].  Otherwise, unremarkable esophagus.  Hiatal hernia.  Otherwise normal stomach.  Bulbar erosion  . Colonoscopy  01/10/2009      RMR: Normal rectum, normal colon; repeat in 2015 due to [place] of colon cancer  . Colonoscopy N/A 01/09/2014    [id number] colonic polyps-removed as described  . Esophagogastroduodenoscopy N/A 01/09/2014    Erosive reflux esophagitis. Small hiatal hernia   Family History  Problem Relation Age of Onset  . Colon cancer Sister     passed away from colon cancer, in her 68s  . Diabetes Father   . Diabetes Other    Social History  Substance Use Topics  . Smoking status: Current Every Day Smoker -- 10 years    Types: Cigarettes  .  Smokeless tobacco: None     Comment: smokes 10 cigarettes daily  . Alcohol Use: Yes     Comment: occasional/wine on the weekends    Review of Systems  Constitutional: Negative for fever.  Respiratory: Positive for cough. Negative for shortness of breath.   Cardiovascular: Positive for chest pain (with coughing).  Gastrointestinal: Negative for vomiting.  Musculoskeletal: Negative for arthralgias.  Neurological: Negative for headaches.  All other systems reviewed and are negative.     Allergies  Review of patient's allergies indicates no known allergies.  Home Medications   Prior to Admission medications   Medication Sig Start Date End Date Taking? Authorizing Provider  lisinopril (PRINIVIL,ZESTRIL) 10 MG tablet Take 10 mg by mouth daily.   Yes Historical Provider, MD  OVER THE COUNTER MEDICATION Take 2 tablets by mouth every 6 (six) hours as needed (cold symptoms).   Yes Historical Provider, MD  azithromycin (ZITHROMAX Z-PAK) 250 MG tablet 2 po day one, then 1 daily x 4 days 02/14/15   Evelina Bucy, MD  chlorpheniramine-HYDROcodone [facility]) 10-8 MG/5ML SUER Take 5 mLs by mouth every 12 (twelve) hours as needed for cough. 02/14/15   Evelina Bucy, MD  dexlansoprazole (DEXILANT) 60 MG capsule Take 1 capsule (60 mg total) by mouth daily. Patient not taking: Reported on 02/14/2015 10/30/14   Orvil Feil, NP  ibuprofen (ADVIL,MOTRIN) 800 MG tablet Take 1 tablet (800 mg total) by mouth 3 (three) times daily. Patient not taking: Reported on 02/14/2015 12/26/14  Charlann Lange, PA-C  LORazepam (ATIVAN) 0.5 MG tablet Take 1 tablet (0.5 mg total) by mouth on call. 30 minutes before MRI. Repeat X 1 if needed. Patient not taking: Reported on 12/26/2014 11/22/14   Orvil Feil, NP  ondansetron (ZOFRAN) 4 MG tablet Take 1 tablet (4 mg total) by mouth every 8 (eight) hours as needed for nausea or vomiting. Patient not taking: Reported on 12/26/2014 10/30/14   Orvil Feil, NP  penicillin v  potassium (VEETID) 500 MG tablet Take 1 tablet (500 mg total) by mouth 3 (three) times daily. Patient not taking: Reported on 02/14/2015 12/26/14   Charlann Lange, PA-C  traMADol (ULTRAM) 50 MG tablet Take 1 tablet (50 mg total) by mouth every 6 (six) hours as needed. Patient not taking: Reported on 02/14/2015 12/26/14   Charlann Lange, PA-C   BP 165/95 mmHg  Pulse 82  Temp(Src) 98.6 F (37 C) (Oral)  Resp 16  SpO2 99% Physical Exam  Constitutional: He is oriented to person, place, and time. He appears well-developed and well-nourished. No distress.  HENT:  Head: Normocephalic and atraumatic.  Mouth/Throat: No oropharyngeal exudate.  Eyes: EOM are normal. Pupils are equal, round, and reactive to light.  Neck: Normal range of motion. Neck supple.  Cardiovascular: Normal rate and regular rhythm.  Exam reveals no friction rub.   No murmur heard. Pulmonary/Chest: Effort normal and breath sounds normal. No respiratory distress. He has no wheezes. He has no rales.  Abdominal: He exhibits no distension. There is no tenderness. There is no rebound.  Musculoskeletal: Normal range of motion. He exhibits no edema.  Neurological: He is alert and oriented to person, place, and time.  Skin: He is not diaphoretic.  Nursing note and vitals reviewed.   ED Course  Procedures (including critical care time) Labs Review Labs Reviewed  BASIC METABOLIC PANEL - Abnormal; Notable for the following:    Glucose, Bld 110 (*)    All other components within normal limits  CBC  I-STAT TROPOININ, ED    Imaging Review Dg Chest 2 View  02/14/2015   CLINICAL DATA:  Pleuritic chest pain for 3 days. Shortness of breath, cough.  EXAM: CHEST  2 VIEW  COMPARISON:  05/13/2013  FINDINGS: Heart and mediastinal contours are within normal limits. No focal opacities or effusions. No acute bony abnormality.  IMPRESSION: No active cardiopulmonary disease.   Electronically Signed   By: Rolm Baptise M.D.   On: 02/14/2015 12:34   I  have personally reviewed and evaluated these images and lab results as part of my medical decision-making.   EKG Interpretation   Date/Time:  Wednesday February 14 2015 11:08:32 EDT Ventricular Rate:  69 PR Interval:  138 QRS Duration: 88 QT Interval:  378 QTC Calculation: 405 R Axis:   47 Text Interpretation:  Sinus rhythm ST elev, probable normal early repol  pattern no acute ischemi No significant change since last tracing  Confirmed by Gerald Leitz ([id number]) on 02/14/2015 11:12:34 AM      MDM   Final diagnoses:  Acute bronchitis, unspecified organism  Cough  Chest pain, unspecified chest pain type    44 year old male here with chest pain. Present for the past several days, happens with cough. No chest pain when not coughing. He is a smoker and has had productive cough with white phlegm. Denies fever, vomiting, shortness of breath. He is having some sinus congestion also. No relief with over-the-counter cold remedies. Will give Z-Pak and cough medicine. Exam benign.  Evelina Bucy, MD 02/14/15 1537

## 2015-02-14 NOTE — ED Notes (Signed)
Pt reports URI symptoms x1 week, facial pain, nasal congestion, productive cough, clear/white sputum. Last night started having generalized weakness and chest pain when coughing. Pain 10/10. Able to speak in full sentences

## 2015-05-09 ENCOUNTER — Emergency Department (HOSPITAL_COMMUNITY)
Admission: EM | Admit: 2015-05-09 | Discharge: 2015-05-10 | Disposition: A | Discharge status: Home / Self Care | Payer: Self-pay | Attending: Emergency Medicine | Admitting: Emergency Medicine

## 2015-05-09 ENCOUNTER — Emergency Department (HOSPITAL_COMMUNITY): Payer: Self-pay

## 2015-05-09 ENCOUNTER — Encounter (HOSPITAL_COMMUNITY): Payer: Self-pay | Admitting: Emergency Medicine

## 2015-05-09 DIAGNOSIS — I10 Essential (primary) hypertension: Secondary | ICD-10-CM | POA: Insufficient documentation

## 2015-05-09 DIAGNOSIS — J0120 Acute ethmoidal sinusitis, unspecified: Secondary | ICD-10-CM | POA: Insufficient documentation

## 2015-05-09 DIAGNOSIS — H538 Other visual disturbances: Secondary | ICD-10-CM | POA: Insufficient documentation

## 2015-05-09 DIAGNOSIS — R51 Headache: Secondary | ICD-10-CM

## 2015-05-09 DIAGNOSIS — R42 Dizziness and giddiness: Secondary | ICD-10-CM | POA: Insufficient documentation

## 2015-05-09 DIAGNOSIS — F17210 Nicotine dependence, cigarettes, uncomplicated: Secondary | ICD-10-CM | POA: Insufficient documentation

## 2015-05-09 DIAGNOSIS — Z79899 Other long term (current) drug therapy: Secondary | ICD-10-CM | POA: Insufficient documentation

## 2015-05-09 DIAGNOSIS — Z8719 Personal history of other diseases of the digestive system: Secondary | ICD-10-CM | POA: Insufficient documentation

## 2015-05-09 DIAGNOSIS — R519 Headache, unspecified: Secondary | ICD-10-CM

## 2015-05-09 LAB — BASIC METABOLIC PANEL
Anion gap: 10 (ref 5–15)
BUN: 15 mg/dL (ref 6–20)
CO2: 24 mmol/L (ref 22–32)
Calcium: 9.6 mg/dL (ref 8.9–10.3)
Chloride: 105 mmol/L (ref 101–111)
Creatinine, Ser: 1.19 mg/dL (ref 0.61–1.24)
GFR calc Af Amer: >60 mL/min (ref >60)
GFR calc non Af Amer: >60 mL/min (ref >60)
Glucose, Bld: 121 mg/dL — ABNORMAL HIGH (ref 65–99)
Potassium: 4.6 mmol/L (ref 3.5–5.1)
Sodium: 139 mmol/L (ref 135–145)

## 2015-05-09 LAB — I-STAT TROPONIN, ED: Troponin i, poc: 0 ng/mL (ref 0.00–0.08)

## 2015-05-09 LAB — CBC
HCT: 47.1 % (ref 39.0–52.0)
Hemoglobin: 15.6 g/dL (ref 13.0–17.0)
MCH: 29.2 pg (ref 26.0–34.0)
MCHC: 33.1 g/dL (ref 30.0–36.0)
MCV: 88.2 fL (ref 78.0–100.0)
Platelets: 245 10*3/uL (ref 150–400)
RBC: 5.34 MIL/uL (ref 4.22–5.81)
RDW: 12.8 % (ref 11.5–15.5)
WBC: 9.8 10*3/uL (ref 4.0–10.5)

## 2015-05-09 MED ORDER — ORPHENADRINE CITRATE ER 100 MG PO TB12: 100.0000 mg | ORAL_TABLET | Freq: Two times a day (BID) | ORAL | Status: DC

## 2015-05-09 MED ORDER — METOCLOPRAMIDE HCL 5 MG/ML IJ SOLN
10.0000 mg | Freq: Once | INTRAMUSCULAR | Status: AC
  Administered 2015-05-09: 10 mg via INTRAVENOUS
  Filled 2015-05-09: qty 2

## 2015-05-09 MED ORDER — AMOXICILLIN-POT CLAVULANATE 875-125 MG PO TABS
1.0000 | ORAL_TABLET | Freq: Once | ORAL | Status: AC
  Administered 2015-05-09: 1 via ORAL
  Filled 2015-05-09: qty 1

## 2015-05-09 MED ORDER — SODIUM CHLORIDE 0.9 % IV SOLN
INTRAVENOUS | Status: DC
  Administered 2015-05-09: 23:00:00 via INTRAVENOUS

## 2015-05-09 MED ORDER — AMOXICILLIN-POT CLAVULANATE 875-125 MG PO TABS: 1.0000 | ORAL_TABLET | Freq: Two times a day (BID) | ORAL | Status: DC

## 2015-05-09 MED ORDER — TRAMADOL-ACETAMINOPHEN 37.5-325 MG PO TABS: 1.0000 | ORAL_TABLET | Freq: Four times a day (QID) | ORAL | Status: DC | PRN

## 2015-05-09 MED ORDER — DIPHENHYDRAMINE HCL 50 MG/ML IJ SOLN
25.0000 mg | Freq: Once | INTRAMUSCULAR | Status: AC
  Administered 2015-05-09: 25 mg via INTRAVENOUS
  Filled 2015-05-09: qty 1

## 2015-05-09 NOTE — Discharge Instructions (Signed)
Sinusitis, Adult °Sinusitis is redness, soreness, and inflammation of the paranasal sinuses. Paranasal sinuses are air pockets within the bones of your face. They are located beneath your eyes, in the middle of your forehead, and above your eyes. In healthy paranasal sinuses, mucus is able to drain out, and air is able to circulate through them by way of your nose. However, when your paranasal sinuses are inflamed, mucus and air can become trapped. This can allow bacteria and other germs to grow and cause infection. °Sinusitis can develop quickly and last only a short time (acute) or continue over a long period (chronic). Sinusitis that lasts for more than 12 weeks is considered chronic. °CAUSES °Causes of sinusitis include: °· Allergies. °· Structural abnormalities, such as displacement of the cartilage that separates your nostrils (deviated septum), which can decrease the air flow through your nose and sinuses and affect sinus drainage. °· Functional abnormalities, such as when the small hairs (cilia) that line your sinuses and help remove mucus do not work properly or are not present. °SIGNS AND SYMPTOMS °Symptoms of acute and chronic sinusitis are the same. The primary symptoms are pain and pressure around the affected sinuses. Other symptoms include: °· Upper toothache. °· Earache. °· Headache. °· Bad breath. °· Decreased sense of smell and taste. °· A cough, which worsens when you are lying flat. °· Fatigue. °· Fever. °· Thick drainage from your nose, which often is green and may contain pus (purulent). °· Swelling and warmth over the affected sinuses. °DIAGNOSIS °Your health care provider will perform a physical exam. During your exam, your health care provider may perform any of the following to help determine if you have acute sinusitis or chronic sinusitis: °· Look in your nose for signs of abnormal growths in your nostrils (nasal polyps). °· Tap over the affected sinus to check for signs of  infection. °· View the inside of your sinuses using an imaging device that has a light attached (endoscope). °If your health care provider suspects that you have chronic sinusitis, one or more of the following tests may be recommended: °· Allergy tests. °· Nasal culture. A sample of mucus is taken from your nose, sent to a lab, and screened for bacteria. °· Nasal cytology. A sample of mucus is taken from your nose and examined by your health care provider to determine if your sinusitis is related to an allergy. °TREATMENT °Most cases of acute sinusitis are related to a viral infection and will resolve on their own within 10 days. Sometimes, medicines are prescribed to help relieve symptoms of both acute and chronic sinusitis. These may include pain medicines, decongestants, nasal steroid sprays, or saline sprays. °However, for sinusitis related to a bacterial infection, your health care provider will prescribe antibiotic medicines. These are medicines that will help kill the bacteria causing the infection. °Rarely, sinusitis is caused by a fungal infection. In these cases, your health care provider will prescribe antifungal medicine. °For some cases of chronic sinusitis, surgery is needed. Generally, these are cases in which sinusitis recurs more than 3 times per year, despite other treatments. °HOME CARE INSTRUCTIONS °· Drink plenty of water. Water helps thin the mucus so your sinuses can drain more easily. °· Use a humidifier. °· Inhale steam 3-4 times a day (for example, sit in the bathroom with the shower running). °· Apply a warm, moist washcloth to your face 3-4 times a day, or as directed by your health care provider. °· Use saline nasal sprays to help   moisten and clean your sinuses.  Take medicines only as directed by your health care provider.  If you were prescribed either an antibiotic or antifungal medicine, finish it all even if you start to feel better. SEEK IMMEDIATE MEDICAL CARE IF:  You have  increasing pain or severe headaches.  You have nausea, vomiting, or drowsiness.  You have swelling around your face.  You have vision problems.  You have a stiff neck.  You have difficulty breathing.   This information is not intended to replace advice given to you by your health care provider. Make sure you discuss any questions you have with your health care provider.   Document Released: 05/12/2005 Document Revised: 06/02/2014 Document Reviewed: 05/27/2011 Elsevier Interactive Patient Education 2016 Else[employer] Inc[employer] Tension Headache A tension headache is a feeling of pain, pressure, or aching that is often felt over the front and sides of the head. The pain can be dull, or it can feel tight (constricting). Tension headaches are not normally associated with nausea or vomiting, and they do not get worse with physical activity. Tension headaches can last from 30 minutes to several days. This is the most common type of headache. CAUSES The exact cause of this condition is not known. Tension headaches often begin after stress, anxiety, or depression. Other triggers may include:  Alcohol.  Too much caffeine, or caffeine withdrawal.  Respiratory infections, such as colds, flu, or sinus infections.  Dental problems or teeth clenching.  Fatigue.  Holding your head and neck in the same position for a long period of time, such as while using a computer.  Smoking. SYMPTOMS Symptoms of this condition include:  A feeling of pressure around the head.  Dull, aching head pain.  Pain felt over the front and sides of the head.  Tenderness in the muscles of the head, neck, and shoulders. DIAGNOSIS This condition may be diagnosed based on your symptoms and a physical exam. Tests may be done, such as a CT scan or an MRI of your head. These tests may be done if your symptoms are severe or unusual. TREATMENT This condition may be treated with lifestyle changes and medicines to help relieve  symptoms. HOME CARE INSTRUCTIONS Managing Pain  Take over-the-counter and prescription medicines only as told by your health care provider.  Lie down in a dark, quiet room when you have a headache.  If directed, apply ice to the head and neck area:  Put ice in a plastic bag.  Place a towel between your skin and the bag.  Leave the ice on for 20 minutes, 2-3 times per day.  Use a heating pad or a hot shower to apply heat to the head and neck area as told by your health care provider. Eating and Drinking  Eat meals on a regular schedule.  Limit alcohol use.  Decrease your caffeine intake, or stop using caffeine. General Instructions  Keep all follow-up visits as told by your health care provider. This is important.  Keep a headache journal to help find out what may trigger your headaches. For example, write down:  What you eat and drink.  How much sleep you get.  Any change to your diet or medicines.  Try massage or other relaxation techniques.  Limit stress.  Sit up straight, and avoid tensing your muscles.  Do not use tobacco products, including cigarettes, chewing tobacco, or e-cigarettes. If you need help quitting, ask your health care provider.  Exercise regularly as told by your health care  provider.  Get 7-9 hours of sleep, or the amount recommended by your health care provider. SEEK MEDICAL CARE IF:  Your symptoms are not helped by medicine.  You have a headache that is different from what you normally experience.  You have nausea or you vomit.  You have a fever. SEEK IMMEDIATE MEDICAL CARE IF:  Your headache becomes severe.  You have repeated vomiting.  You have a stiff neck.  You have a loss of vision.  You have problems with speech.  You have pain in your eye or ear.  You have muscular weakness or loss of muscle control.  You lose your balance or you have trouble walking.  You feel faint or you pass out.  You have confusion.    This information is not intended to replace advice given to you by your health care provider. Make sure you discuss any questions you have with your health care provider.   Document Released: 05/12/2005 Document Revised: 01/31/2015 Document Reviewed: 09/04/2014 Elsevier Interactive Patient Education 2016 [employer]. Hypertension Hypertension, commonly called high blood pressure, is when the force of blood pumping through your arteries is too strong. Your arteries are the blood vessels that carry blood from your heart throughout your body. A blood pressure reading consists of a higher number over a lower number, such as 110/72. The higher number (systolic) is the pressure inside your arteries when your heart pumps. The lower number (diastolic) is the pressure inside your arteries when your heart relaxes. Ideally you want your blood pressure below 120/80. Hypertension forces your heart to work harder to pump blood. Your arteries may become narrow or stiff. Having untreated or uncontrolled hypertension can cause heart attack, stroke, kidney disease, and other problems. RISK FACTORS Some risk factors for high blood pressure are controllable. Others are not.  Risk factors you cannot control include:   Race. You may be at higher risk if you are African American.  Age. Risk increases with age.  Gender. Men are at higher risk than women before age 78 years. After age 43, women are at higher risk than men. Risk factors you can control include:  Not getting enough exercise or physical activity.  Being overweight.  Getting too much fat, sugar, calories, or salt in your diet.  Drinking too much alcohol. SIGNS AND SYMPTOMS Hypertension does not usually cause signs or symptoms. Extremely high blood pressure (hypertensive crisis) may cause headache, anxiety, shortness of breath, and nosebleed. DIAGNOSIS To check if you have hypertension, your health care provider will measure your blood pressure  while you are seated, with your arm held at the level of your heart. It should be measured at least twice using the same arm. Certain conditions can cause a difference in blood pressure between your right and left arms. A blood pressure reading that is higher than normal on one occasion does not mean that you need treatment. If it is not clear whether you have high blood pressure, you may be asked to return on a different day to have your blood pressure checked again. Or, you may be asked to monitor your blood pressure at home for 1 or more weeks. TREATMENT Treating high blood pressure includes making lifestyle changes and possibly taking medicine. Living a healthy lifestyle can help lower high blood pressure. You may need to change some of your habits. Lifestyle changes may include:  Following the DASH diet. This diet is high in fruits, vegetables, and whole grains. It is low in salt, red meat,  and added sugars.  Keep your sodium intake below 2,300 mg per day.  Getting at least 30-45 minutes of aerobic exercise at least 4 times per week.  Losing weight if necessary.  Not smoking.  Limiting alcoholic beverages.  Learning ways to reduce stress. Your health care provider may prescribe medicine if lifestyle changes are not enough to get your blood pressure under control, and if one of the following is true:  You are 55-95 years of age and your systolic blood pressure is above 140.  You are 26 years of age or older, and your systolic blood pressure is above 150.  Your diastolic blood pressure is above 90.  You have diabetes, and your systolic blood pressure is over [id number] or your diastolic blood pressure is over 90.  You have kidney disease and your blood pressure is above 140/90.  You have heart disease and your blood pressure is above 140/90. Your personal target blood pressure may vary depending on your medical conditions, your age, and other factors. HOME CARE INSTRUCTIONS  Have your  blood pressure rechecked as directed by your health care provider.   Take medicines only as directed by your health care provider. Follow the directions carefully. Blood pressure medicines must be taken as prescribed. The medicine does not work as well when you skip doses. Skipping doses also puts you at risk for problems.  Do not smoke.   Monitor your blood pressure at home as directed by your health care provider. SEEK MEDICAL CARE IF:   You think you are having a reaction to medicines taken.  You have recurrent headaches or feel dizzy.  You have swelling in your ankles.  You have trouble with your vision. SEEK IMMEDIATE MEDICAL CARE IF:  You develop a severe headache or confusion.  You have unusual weakness, numbness, or feel faint.  You have severe chest or abdominal pain.  You vomit repeatedly.  You have trouble breathing. MAKE SURE YOU:   Understand these instructions.  Will watch your condition.  Will get help right away if you are not doing well or get worse.   This information is not intended to replace advice given to you by your health care provider. Make sure you discuss any questions you have with your health care provider.   Document Released: 05/12/2005 Document Revised: 09/26/2014 Document Reviewed: 03/04/2013 Elsevier Interactive Patient Education [employer].

## 2015-05-09 NOTE — ED Provider Notes (Signed)
CSN: [id number]     Arrival date & time 05/09/15  2000 History   First MD Initiated Contact with Patient 05/09/15 2036     Chief Complaint  Patient presents with  . Headache  . Blurred Vision  . Dizziness     (Consider location/radiation/quality/duration/timing/severity/associated sxs/prior Treatment) HPI Patient reports for the past 3 days he's been having a headache. Here force it was kind and going initially in then just Getting more intense. He reports the headache is generalized in nature. It is aching and throbbing. He reports he also has some discomfort in the back of his neck. Patient reports off and on he has noted some blurring in his vision and maybe even some wavy lines. No nausea or vomiting. He reports at times it makes him feel somewhat dizzy. He denies a prior headache history. He has been trying ibuprofen ibuprofen for pain and occasionally that is helpful. He reports at work in a dusty environment due to sinus drainage. He denies earache, toothache or sore throat. Past Medical History  Diagnosis Date  . Hypertension   . Acid reflux   . Hiatal hernia    Past Surgical History  Procedure Laterality Date  . Esophagogastroduodenoscopy  10/04/2007    RMR: Distal esophageal erosions consistent with erosive reflux esophagitis, patulous gastroesophageal junction status post passage of a  [place] dilator, 62 [place].  Otherwise, unremarkable esophagus.  Hiatal hernia.  Otherwise normal stomach.  Bulbar erosion  . Colonoscopy  01/10/2009      RMR: Normal rectum, normal colon; repeat in 2015 due to [place] of colon cancer  . Colonoscopy N/A 01/09/2014    [id number] colonic polyps-removed as described  . Esophagogastroduodenoscopy N/A 01/09/2014    Erosive reflux esophagitis. Small hiatal hernia   Family History  Problem Relation Age of Onset  . Colon cancer Sister     passed away from colon cancer, in her 90s  . Diabetes Father   . Diabetes Other    Social History  Substance  Use Topics  . Smoking status: Current Every Day Smoker -- 10 years    Types: Cigarettes  . Smokeless tobacco: None     Comment: smokes 10 cigarettes daily  . Alcohol Use: Yes     Comment: occasional/wine on the weekends    Review of Systems  10 Systems reviewed and are negative for acute change except as noted in the HPI.   Allergies  Review of patient's allergies indicates no known allergies.  Home Medications   Prior to Admission medications   Medication Sig Start Date End Date Taking? Authorizing Provider  atenolol (TENORMIN) 25 MG tablet Take 25 mg by mouth daily.   Yes Historical Provider, MD  cetirizine (ZYRTEC ALLERGY) 10 MG tablet Take 10 mg by mouth daily as needed for allergies.   Yes Historical Provider, MD  cyclobenzaprine (FLEXERIL) 10 MG tablet Take 10 mg by mouth at bedtime as needed for muscle spasms.   Yes Historical Provider, MD  amoxicillin-clavulanate (AUGMENTIN) 875-125 MG tablet Take 1 tablet by mouth 2 (two) times daily. One po bid x 7 days 05/09/15   Charlesetta Shanks, MD  azithromycin (ZITHROMAX Z-PAK) 250 MG tablet 2 po day one, then 1 daily x 4 days Patient not taking: Reported on 05/09/2015 02/14/15   Evelina Bucy, MD  chlorpheniramine-HYDROcodone [facility]) 10-8 MG/5ML SUER Take 5 mLs by mouth every 12 (twelve) hours as needed for cough. Patient not taking: Reported on 05/09/2015 02/14/15   Evelina Bucy, MD  dexlansoprazole (  DEXILANT) 60 MG capsule Take 1 capsule (60 mg total) by mouth daily. Patient not taking: Reported on 02/14/2015 10/30/14   Orvil Feil, NP  ibuprofen (ADVIL,MOTRIN) 800 MG tablet Take 1 tablet (800 mg total) by mouth 3 (three) times daily. Patient not taking: Reported on 02/14/2015 12/26/14   Charlann Lange, PA-C  LORazepam (ATIVAN) 0.5 MG tablet Take 1 tablet (0.5 mg total) by mouth on call. 30 minutes before MRI. Repeat X 1 if needed. Patient not taking: Reported on 12/26/2014 11/22/14   Orvil Feil, NP  ondansetron (ZOFRAN) 4 MG  tablet Take 1 tablet (4 mg total) by mouth every 8 (eight) hours as needed for nausea or vomiting. Patient not taking: Reported on 12/26/2014 10/30/14   Orvil Feil, NP  orphenadrine (NORFLEX) 100 MG tablet Take 1 tablet (100 mg total) by mouth 2 (two) times daily. 05/09/15   Charlesetta Shanks, MD  penicillin v potassium (VEETID) 500 MG tablet Take 1 tablet (500 mg total) by mouth 3 (three) times daily. Patient not taking: Reported on 02/14/2015 12/26/14   Charlann Lange, PA-C  traMADol (ULTRAM) 50 MG tablet Take 1 tablet (50 mg total) by mouth every 6 (six) hours as needed. Patient not taking: Reported on 02/14/2015 12/26/14   Charlann Lange, PA-C  traMADol-acetaminophen (ULTRACET) 37.5-325 MG tablet Take 1-2 tablets by mouth every 6 (six) hours as needed. 05/09/15   Charlesetta Shanks, MD   BP 139/98 mmHg  Pulse 84  Temp(Src) 98.4 F (36.9 C) (Oral)  Resp 18  SpO2 100% Physical Exam  Constitutional: He is oriented to person, place, and time. He appears well-developed and well-nourished.  HENT:  Head: Normocephalic and atraumatic.  Right eardrum normal. Left ear drum moderate erythema. Dentition normal. Posterior oropharynx mildly patent.  Eyes: EOM are normal. Pupils are equal, round, and reactive to light.  Neck: Neck supple.  Cardiovascular: Normal rate, regular rhythm, normal heart sounds and intact distal pulses.   Pulmonary/Chest: Effort normal and breath sounds normal.  Abdominal: Soft. Bowel sounds are normal. He exhibits no distension. There is no tenderness.  Musculoskeletal: Normal range of motion. He exhibits no edema.  Neurological: He is alert and oriented to person, place, and time. He has normal strength. No cranial nerve deficit. He exhibits normal muscle tone. Coordination normal. GCS eye subscore is 4. GCS verbal subscore is 5. GCS motor subscore is 6.  Skin: Skin is warm, dry and intact.  Psychiatric: He has a normal mood and affect.    ED Course  Procedures (including critical  care time) Labs Review Labs Reviewed  BASIC METABOLIC PANEL - Abnormal; Notable for the following:    Glucose, Bld 121 (*)    All other components within normal limits  CBC  I-STAT TROPOININ, ED    Imaging Review Ct Head Wo Contrast  05/09/2015  CLINICAL DATA:  Generalized headache with dizziness and blurred vision for 3 days EXAM: CT HEAD WITHOUT CONTRAST TECHNIQUE: Contiguous axial images were obtained from the base of the skull through the vertex without intravenous contrast. COMPARISON:  August 10, 2010 FINDINGS: The ventricles are normal in size and configuration. There is no intracranial mass, hemorrhage, extra-axial fluid collection, or midline shift. Gray-white compartments are normal. No acute infarct evident. Bony calvarium appears intact. The mastoid air cells are clear. There is a bony defect in the left lamina papyracea, a stable finding. There is mild mucosal thickening in several ethmoid air cells. No intraorbital lesions are identified. IMPRESSION: Mild ethmoid sinus disease bilaterally.  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 No intracranial mass, hemorrhage, or gray-white compartment lesion/acute appearing infarct. Electronically Signed   By: Lowella Grip III M.D.   On: 05/09/2015 21:50   I have personally reviewed and evaluated these images and lab results as part of my medical decision-making.   EKG Interpretation   Date/Time:  Wednesday May 09 2015 20:08:21 EST Ventricular Rate:  96 PR Interval:  148 QRS Duration: 91 QT Interval:  328 QTC Calculation: 414 R Axis:   25 Text Interpretation:  Sinus rhythm Left ventricular hypertrophy  Nonspecific T abnormalities, lateral leads ST elev, probable normal early  repol pattern Baseline wander in lead(s) I II aVR V3 no acute change from  old Confirmed by Johnney Killian, MD, Jeannie Done [phone]) on 05/09/2015 8:19:35 PM      MDM   Final diagnoses:  Acute nonintractable headache, unspecified headache type   Acute ethmoidal sinusitis, recurrence not specified   Patient presents with headache that has at onset and waxing and waning. He has become more persistent. He does identify a lot of dust exposure and nasal discharge for working. As well he feels tightness and stiffness in the neck was not. At this point CT head shows sinusitis. Patient will be treated for sinusitis as well as an element of tension headache. No neurologic dysfunction. No fever. He is counseled on signs and since for return.    Charlesetta Shanks, MD 05/09/15 [id number]

## 2015-05-09 NOTE — ED Notes (Signed)
Pt complaining of generalized headache radiating into neck and shoulders, dizziness, generalized chest pain, fatigue, SOB, and blurred vision on-going x 3 days. Denies N/V/D, fever/chills. 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 Began taking medication for his HTN last week.

## 2015-05-09 NOTE — ED Notes (Signed)
Patient transported to CT 

## 2015-07-19 ENCOUNTER — Telehealth: Payer: Self-pay

## 2015-07-19 ENCOUNTER — Encounter: Payer: Self-pay | Admitting: Nurse Practitioner

## 2015-07-19 ENCOUNTER — Encounter (INDEPENDENT_AMBULATORY_CARE_PROVIDER_SITE_OTHER): Payer: Self-pay

## 2015-07-19 ENCOUNTER — Ambulatory Visit (INDEPENDENT_AMBULATORY_CARE_PROVIDER_SITE_OTHER): Payer: BLUE CROSS/BLUE SHIELD | Admitting: Nurse Practitioner

## 2015-07-19 VITALS — BP 140/90 | HR 82 | Temp 97.8°F | Ht 69.0 in | Wt 249.2 lb

## 2015-07-19 DIAGNOSIS — R938 Abnormal findings on diagnostic imaging of other specified body structures: Secondary | ICD-10-CM | POA: Diagnosis not present

## 2015-07-19 DIAGNOSIS — R9389 Abnormal findings on diagnostic imaging of other specified body structures: Secondary | ICD-10-CM | POA: Insufficient documentation

## 2015-07-19 DIAGNOSIS — K2100 Gastro-esophageal reflux disease with esophagitis, without bleeding: Secondary | ICD-10-CM

## 2015-07-19 MED ORDER — DEXLANSOPRAZOLE 60 MG PO CPDR: 60.0000 mg | DELAYED_RELEASE_CAPSULE | Freq: Every day | ORAL | Status: DC

## 2015-07-19 NOTE — Telephone Encounter (Signed)
Spoke with [place] at [place]. No PA is needed for code [id number] (MRI ABD)  Ref # is [id number]

## 2015-07-19 NOTE — Progress Notes (Signed)
Referring Provider: Raiford Simmonds., PA-C Primary Care Physician:  Raiford Simmonds., PA-C Primary GI:  Dr. Gala Romney  Chief Complaint  Patient presents with  . Follow-up    HPI:   [name] is a 45 y.o. male who presents for follow-up on GERD. Last seen in our office 10/30/2014 for the same. At that time noted likely uncontrolled GERD secondary to lack of PPI because of no insurance coverage. Chronic nausea, intermittent abdominal pain. Gallbladder remains in situ but doubt biliary etiology. At that time Dexilant was restarted with samples provided and a prescription given for when insurance is obtained. Abdominal ultrasound ordered at patient's request and recommend 3 month follow-up for which the patient was a no-show. Abdominal ultrasound completed 11/01/2014 which found an indeterminant 12 mm liver lesion, recommended hemangioma protocol CT or MRI. This was recommended and patient scheduled MRI, but it does not appear to been completed. Colonoscopy and endoscopy up-to-date. Due for next colonoscopy in August 2018.  Today he states he's doing pretty good today. Still having abdominal pain in the epigastric pain. The pain is now also in the left chest area and "when I put my hand down on a table and lean on it, it tingles." He does not have a PCP but would like a referral to one to establish a relationship. He has insurance now and would like to schedule the MRI again. GERD is flaring up, is taking OTC "acid reducer" (likely not a PPI.) Is requesting to go back on Dexilant. Has tried and failed omeprazole, famotadine, GI cocktail, Zantac, protonix. Denies other abdominal pain, vomiting, hematochezia, melena. Occasional nausea associated with GERD symptoms. Denies chest pain, dyspnea, dizziness, lightheadedness, syncope, near syncope. Denies any other upper or lower GI symptoms.  Is only taking Tenormin for BP currently. No other meds due to no insurance until now.  Past Medical History    Diagnosis Date  . Hypertension   . Acid reflux   . Hiatal hernia     Past Surgical History  Procedure Laterality Date  . Esophagogastroduodenoscopy  10/04/2007    RMR: Distal esophageal erosions consistent with erosive reflux esophagitis, patulous gastroesophageal junction status post passage of a  Maloney dilator, 69 [place].  Otherwise, unremarkable esophagus.  Hiatal hernia.  Otherwise normal stomach.  Bulbar erosion  . Colonoscopy  01/10/2009      RMR: Normal rectum, normal colon; repeat in 2015 due to [employer] of colon cancer  . Colonoscopy N/A 01/09/2014    [id number] colonic polyps-removed as described  . Esophagogastroduodenoscopy N/A 01/09/2014    Erosive reflux esophagitis. Small hiatal hernia    Current Outpatient Prescriptions  Medication Sig Dispense Refill  . atenolol (TENORMIN) 25 MG tablet Take 25 mg by mouth daily.    [name] amoxicillin-clavulanate (AUGMENTIN) 875-125 MG tablet Take 1 tablet by mouth 2 (two) times daily. One po bid x 7 days (Patient not taking: Reported on 07/19/2015) 14 tablet 0  . azithromycin (ZITHROMAX Z-PAK) 250 MG tablet 2 po day one, then 1 daily x 4 days (Patient not taking: Reported on 05/09/2015) 6 tablet 0  . cetirizine (ZYRTEC ALLERGY) 10 MG tablet Take 10 mg by mouth daily as needed for allergies. Reported on 07/19/2015    . chlorpheniramine-HYDROcodone (TUSSIONEX PENNKINETIC ER) 10-8 MG/5ML SUER Take 5 mLs by mouth every 12 (twelve) hours as needed for cough. (Patient not taking: Reported on 05/09/2015) 140 mL 0  . cyclobenzaprine (FLEXERIL) 10 MG tablet Take 10 mg by mouth at bedtime as needed  for muscle spasms. Reported on 07/19/2015    . dexlansoprazole (DEXILANT) 60 MG capsule Take 1 capsule (60 mg total) by mouth daily. (Patient not taking: Reported on 02/14/2015) 90 capsule 3  . ibuprofen (ADVIL,MOTRIN) 800 MG tablet Take 1 tablet (800 mg total) by mouth 3 (three) times daily. (Patient not taking: Reported on 02/14/2015) 21 tablet 0  . LORazepam  (ATIVAN) 0.5 MG tablet Take 1 tablet (0.5 mg total) by mouth on call. 30 minutes before MRI. Repeat X 1 if needed. (Patient not taking: Reported on 12/26/2014) 2 tablet 0  . ondansetron (ZOFRAN) 4 MG tablet Take 1 tablet (4 mg total) by mouth every 8 (eight) hours as needed for nausea or vomiting. (Patient not taking: Reported on 12/26/2014) 30 tablet 1  . orphenadrine (NORFLEX) 100 MG tablet Take 1 tablet (100 mg total) by mouth 2 (two) times daily. (Patient not taking: Reported on 07/19/2015) 30 tablet 0  . penicillin v potassium (VEETID) 500 MG tablet Take 1 tablet (500 mg total) by mouth 3 (three) times daily. (Patient not taking: Reported on 02/14/2015) 30 tablet 0  . traMADol (ULTRAM) 50 MG tablet Take 1 tablet (50 mg total) by mouth every 6 (six) hours as needed. (Patient not taking: Reported on 02/14/2015) 15 tablet 0  . traMADol-acetaminophen (ULTRACET) 37.5-325 MG tablet Take 1-2 tablets by mouth every 6 (six) hours as needed. (Patient not taking: Reported on 07/19/2015) 30 tablet 0   No current facility-administered medications for this visit.    Allergies as of 07/19/2015  . (No Known Allergies)    Family History  Problem Relation Age of Onset  . Colon cancer Sister     passed away from colon cancer, in her 31s  . Diabetes Father   . Diabetes Other     Social History   Social History  . Marital Status: Single    Spouse Name: N/A  . Number of Children: N/A  . Years of Education: N/A   Occupational History  . 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 Smoking status: Current Every Day Smoker -- 10 years    Types: Cigarettes  . Smokeless tobacco: None     Comment: smokes 10 cigarettes daily  . Alcohol Use: Yes     Comment: occasional/wine on the weekends  . Drug Use: No  . Sexual Activity: Not Asked   Other Topics Concern  . None   Social History Narrative    Review of Systems: General: Negative for anorexia, weight loss, fever,  chills, fatigue, weakness. ENT: Negative for hoarseness, difficulty swallowing. CV: Negative for chest pain, angina, palpitations, peripheral edema.  Respiratory: Negative for dyspnea at rest, cough, sputum, wheezing.  GI: See history of present illness. Endo: Negative for unusual weight change.  Heme: Negative for bruising or bleeding.  Physical Exam: BP 140/90 mmHg  Pulse 82  Temp(Src) 97.8 F (36.6 C) (Oral)  Ht 5\' 9"  (1.753 m)  Wt 249 lb 3.2 oz (113.036 kg)  BMI 36.78 kg/m2 General:   Obese male, alert and oriented. Pleasant and cooperative. Well-nourished and well-developed.  Ears:  Normal auditory acuity. Cardiovascular:  S1, S2 present without murmurs appreciated. Extremities without clubbing or edema. Respiratory:  Clear to auscultation bilaterally. No wheezes, rales, or rhonchi. No distress.  Gastrointestinal:  +BS, soft, and non-distended. Mild epigastric TTP. No HSM noted. No guarding or rebound. No masses appreciated.  Rectal:  Deferred  Musculoskalatal:  Symmetrical without gross deformities.  Skin:  Intact without significant lesions or rashes. Neurologic:  Alert and oriented x4;  grossly normal neurologically. Psych:  Alert and cooperative. Normal mood and affect.    07/19/2015 8:25 AM

## 2015-07-19 NOTE — Patient Instructions (Signed)
1. We will provide you with a list of area primary care providers. 2. Start taking Dexilant 60 mg once a day. I sent a prescription to your pharmacy and we will give you samples to last 2 weeks until you can have the prescription filled. I also gave you a co-pay card to help reduce your out-of-pocket expenses. 3. I ordered the MRI of your abdomen. We will schedule that for you. 4. Return for follow-up in 3 months.

## 2015-07-19 NOTE — Progress Notes (Signed)
CC'D TO PCP °

## 2015-07-19 NOTE — Assessment & Plan Note (Signed)
Patient with indeterminate 12 mm lesion in the right lobe of the liver. Radiology recommended hemangioma protocol CT or MRI. The patient schedule the MRI but did not follow through due to no insurance. He has insurance she is requesting the MRI to be done to further characterize the liver lesion. Order has been entered for this and scheduling per our office. Return for follow-up in 3 months.

## 2015-07-19 NOTE — Assessment & Plan Note (Signed)
Patient with persistent GERD and noncompliance with PPI therapy due to lack of healthcare insurance. He is now employed and has Probation officer and would like to get started back on Dexilant as it improved his symptoms. He has failed multiple other PPIs as listed in the history of present illness. He is also requesting a list of area primary care providers so he can become establish with regular primary care. Today we will provide him this list, give him samples of Dexilant the last 2 weeks, and send in a prescription for Dexilant to his pharmacy as well as provide him a co-pay card to help with co-pay assistance. Return for follow-up in 3 months.

## 2015-07-30 ENCOUNTER — Other Ambulatory Visit: Payer: Self-pay

## 2015-07-30 ENCOUNTER — Inpatient Hospital Stay: Admission: RE | Admit: 2015-07-30 | Payer: BLUE CROSS/BLUE SHIELD | Source: Ambulatory Visit

## 2015-07-31 ENCOUNTER — Other Ambulatory Visit (HOSPITAL_COMMUNITY): Payer: BLUE CROSS/BLUE SHIELD

## 2015-08-07 ENCOUNTER — Inpatient Hospital Stay: Admission: RE | Admit: 2015-08-07 | Payer: BLUE CROSS/BLUE SHIELD | Source: Ambulatory Visit

## 2015-10-16 ENCOUNTER — Ambulatory Visit: Payer: BLUE CROSS/BLUE SHIELD | Admitting: Nurse Practitioner

## 2015-10-30 ENCOUNTER — Encounter: Payer: Self-pay | Admitting: Nurse Practitioner

## 2015-10-30 ENCOUNTER — Ambulatory Visit (INDEPENDENT_AMBULATORY_CARE_PROVIDER_SITE_OTHER): Payer: BLUE CROSS/BLUE SHIELD | Admitting: Nurse Practitioner

## 2015-10-30 VITALS — BP 143/91 | HR 77 | Temp 98.4°F | Ht 69.0 in | Wt 252.2 lb

## 2015-10-30 DIAGNOSIS — K2100 Gastro-esophageal reflux disease with esophagitis, without bleeding: Secondary | ICD-10-CM

## 2015-10-30 DIAGNOSIS — R9389 Abnormal findings on diagnostic imaging of other specified body structures: Secondary | ICD-10-CM

## 2015-10-30 DIAGNOSIS — R938 Abnormal findings on diagnostic imaging of other specified body structures: Secondary | ICD-10-CM

## 2015-10-30 MED ORDER — DEXLANSOPRAZOLE 60 MG PO CPDR: 60.0000 mg | DELAYED_RELEASE_CAPSULE | Freq: Every day | ORAL | Status: DC

## 2015-10-30 NOTE — Assessment & Plan Note (Signed)
MRI to follow-up on liver lesion likely hemangioma was scheduled, prior auth obtained, and never completed. States he didn't do a because it was too expensive. Today he admits he needs to have it done and we'll call to have a rescheduled. Recommend he do so. Return for follow-up in 3 months.

## 2015-10-30 NOTE — Progress Notes (Signed)
Referring Provider: Raiford Simmonds., PA-C Primary Care Physician:  Raiford Simmonds., PA-C Primary GI:  Dr. Gala Romney  Chief Complaint  Patient presents with  . Follow-up  . Gastroesophageal Reflux    HPI:   [name] is a 45 y.o. male who presents For follow-up on GERD. He was last seen in our office 07/19/2015. At that time he noted that he has stopped taking Dexilant and was taking over-the-counter "acid reducer" and requesting to go back on Dexilant. Has tried and failed omeprazole, famotidine, GI cocktail, Zantac, Protonix. Noted noncompliance of PPI therapy due to lack of healthcare insurance other he did have insurance at his last visit. He was provided with illicit primary care providers for status relationship, given samples of Dexilant for 2 weeks and sent prescription for Dexon to the pharmacy as well as provide a co-pay card to help with co-pay assistance. At that time was also discussed that his abdominal ultrasound recently done from 12 mm lesion right lobe of his liver with recommended hemangioma protocol CT or MRI. MRI was scheduled but did not follow through due to no insurance and his last visit he was requesting to have that rescheduled. This order was entered into the computer but, again, does not appear to been completed.  Today he states he's doing well. States "I need more pills." States he called his pharmacy but they stated they never received his Rx for Dexilant. Isn't sure how much it'll cost. States he called in once to notify us, but no phone note in the system. Nursing staff states they told him he didn't get it because it was too expensive. Reminded him he was provided a copay card, but states "I think so, but I probably misplaced it." Still having GERD symptoms, samples lasted about 15-20 day, symoptoms returned shortly thereafter. States he hasn't gotten the MRI scheduled because "it'll cost $1000" but notes he wants to have it done. Also notes some  claustrophobia and will go to [facility] for open MRI. Notifed him they no longer have one. Denies hematochezia, melena. Denies NSAID use. No other upper or lower GI symptoms.  Past Medical History  Diagnosis Date  . Hypertension   . Acid reflux   . Hiatal hernia     Past Surgical History  Procedure Laterality Date  . Esophagogastroduodenoscopy  10/04/2007    RMR: Distal esophageal erosions consistent with erosive reflux esophagitis, patulous gastroesophageal junction status post passage of a  Maloney dilator, 73 [place].  Otherwise, unremarkable esophagus.  Hiatal hernia.  Otherwise normal stomach.  Bulbar erosion  . Colonoscopy  01/10/2009      RMR: Normal rectum, normal colon; repeat in 2015 due to [place] of colon cancer  . Colonoscopy N/A 01/09/2014    [id number] colonic polyps-removed as described  . Esophagogastroduodenoscopy N/A 01/09/2014    Erosive reflux esophagitis. Small hiatal hernia    Current Outpatient Prescriptions  Medication Sig Dispense Refill  . atenolol (TENORMIN) 25 MG tablet Take 25 mg by mouth daily.    . cetirizine (ZYRTEC ALLERGY) 10 MG tablet Take 10 mg by mouth daily as needed for allergies. Reported on 07/19/2015    . dexlansoprazole (DEXILANT) 60 MG capsule Take 1 capsule (60 mg total) by mouth daily. 90 capsule 3   No current facility-administered medications for this visit.    Allergies as of 10/30/2015  . (No Known Allergies)    Family History  Problem Relation Age of Onset  . Colon cancer Sister  passed away from colon cancer, in her 74s  . Diabetes Father   . Diabetes Other     Social History   Social History  . Marital Status: Single    Spouse Name: N/A  . Number of Children: N/A  . 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 Smoking status: Current Every Day Smoker -- 0.25 packs/day for 10 years    Types: Cigarettes  . Smokeless tobacco: Never  Used  . Alcohol Use: 0.0 oz/week    0 Standard drinks or equivalent per week     Comment: occasional/wine on the weekends  . Drug Use: No  . Sexual Activity: Not Asked   Other Topics Concern  . None   Social History Narrative    Review of Systems: General: Negative for anorexia, weight loss, fever, chills, fatigue, weakness. ENT: Negative for hoarseness, difficulty swallowing. CV: Negative for chest pain, angina, palpitations, peripheral edema.  Respiratory: Negative for dyspnea at rest, cough, sputum, wheezing.  GI: See history of present illness. Endo: Negative for unusual weight change.    Physical Exam: BP 143/91 mmHg  Pulse 77  Temp(Src) 98.4 F (36.9 C)  Ht 5\' 9"  (1.753 m)  Wt 252 lb 3.2 oz (114.397 kg)  BMI 37.23 kg/m2 General:   Alert and oriented. Pleasant and cooperative. Well-nourished and well-developed.  Ears:  Normal auditory acuity. Cardiovascular:  S1, S2 present without murmurs appreciated. Extremities without clubbing or edema. Respiratory:  Clear to auscultation bilaterally. No wheezes, rales, or rhonchi. No distress.  Gastrointestinal:  +BS, rounded but soft, non-tender and non-distended. No HSM noted. No guarding or rebound. No masses appreciated.  Rectal:  Deferred  Musculoskalatal:  Symmetrical without gross deformities. Neurologic:  Alert and oriented x4;  grossly normal neurologically. Psych:  Alert and cooperative. Normal mood and affect. Heme/Lymph/Immune: No excessive bruising noted.    10/30/2015 10:03 AM   Disclaimer: This note was dictated with voice recognition software. Similar sounding words can inadvertently be transcribed and may not be corrected upon review.

## 2015-10-30 NOTE — Progress Notes (Signed)
cc'ed to pcp °

## 2015-10-30 NOTE — Patient Instructions (Signed)
1. I sent a prescription for Dexilant to your [facility] [address] in [place]. 2. We will provide you with another co-pay card that can reduce her co-pay costs for the medication. 3. Call to have MRI rescheduled. 4. Return for follow-up in 3 months.

## 2015-10-30 NOTE — Assessment & Plan Note (Signed)
Continued GERD symptoms which restarted after finishing the samples of Dexilant. He did not have the prescription filled that was sent to his pharmacy at [facility]. He told the staff she didn't have it filled because he could not afford it. He told me he didn't have it filled because when he called [facility] they said they never received it. States he called [place] to tell us one time however no phone note noted in the system. Is requesting [facility] in [place]. I will resend Dexon prescription and provide him with a co-pay card. Return for follow-up in 3 months.

## 2015-10-31 ENCOUNTER — Telehealth: Payer: Self-pay

## 2015-10-31 NOTE — Telephone Encounter (Signed)
Pt called- he needs a letter for his employer stating what medications he is taking. I spoke with Shauna Hugh at his job, there is some confusion between what the pt is taking and what is on his AVS from yesterday. 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 I have completed a letter and faxed it to [phone]

## 2015-10-31 NOTE — Telephone Encounter (Signed)
Noted  

## 2015-11-19 ENCOUNTER — Emergency Department (HOSPITAL_COMMUNITY)
Admission: EM | Admit: 2015-11-19 | Discharge: 2015-11-19 | Disposition: A | Discharge status: Home / Self Care | Payer: BLUE CROSS/BLUE SHIELD | Attending: Emergency Medicine | Admitting: Emergency Medicine

## 2015-11-19 ENCOUNTER — Encounter (HOSPITAL_COMMUNITY): Payer: Self-pay | Admitting: Emergency Medicine

## 2015-11-19 DIAGNOSIS — R51 Headache: Secondary | ICD-10-CM | POA: Diagnosis not present

## 2015-11-19 DIAGNOSIS — R519 Headache, unspecified: Secondary | ICD-10-CM

## 2015-11-19 DIAGNOSIS — F17210 Nicotine dependence, cigarettes, uncomplicated: Secondary | ICD-10-CM | POA: Diagnosis not present

## 2015-11-19 DIAGNOSIS — Z79899 Other long term (current) drug therapy: Secondary | ICD-10-CM | POA: Diagnosis not present

## 2015-11-19 DIAGNOSIS — K5900 Constipation, unspecified: Secondary | ICD-10-CM | POA: Diagnosis not present

## 2015-11-19 DIAGNOSIS — I10 Essential (primary) hypertension: Secondary | ICD-10-CM | POA: Diagnosis not present

## 2015-11-19 LAB — COMPREHENSIVE METABOLIC PANEL
ALT: 22 U/L (ref 17–63)
AST: 23 U/L (ref 15–41)
Albumin: 4.1 g/dL (ref 3.5–5.0)
Alkaline Phosphatase: 59 U/L (ref 38–126)
Anion gap: 7 (ref 5–15)
BUN: 15 mg/dL (ref 6–20)
CO2: 26 mmol/L (ref 22–32)
Calcium: 9.2 mg/dL (ref 8.9–10.3)
Chloride: 102 mmol/L (ref 101–111)
Creatinine, Ser: 1.12 mg/dL (ref 0.61–1.24)
GFR calc Af Amer: >60 mL/min (ref >60)
GFR calc non Af Amer: >60 mL/min (ref >60)
Glucose, Bld: 124 mg/dL — ABNORMAL HIGH (ref 65–99)
Potassium: 4.4 mmol/L (ref 3.5–5.1)
Sodium: 135 mmol/L (ref 135–145)
Total Bilirubin: 0.6 mg/dL (ref 0.3–1.2)
Total Protein: 7.9 g/dL (ref 6.5–8.1)

## 2015-11-19 LAB — CBC
HCT: 46.5 % (ref 39.0–52.0)
Hemoglobin: 16 g/dL (ref 13.0–17.0)
MCH: 28.3 pg (ref 26.0–34.0)
MCHC: 34.4 g/dL (ref 30.0–36.0)
MCV: 82.2 fL (ref 78.0–100.0)
Platelets: 249 10*3/uL (ref 150–400)
RBC: 5.66 MIL/uL (ref 4.22–5.81)
RDW: 12.8 % (ref 11.5–15.5)
WBC: 7.7 10*3/uL (ref 4.0–10.5)

## 2015-11-19 LAB — LIPASE, BLOOD: Lipase: 37 U/L (ref 11–51)

## 2015-11-19 MED ORDER — CETIRIZINE HCL 10 MG PO TABS: 10.0000 mg | ORAL_TABLET | Freq: Every day | ORAL | Status: DC

## 2015-11-19 MED ORDER — ACETAMINOPHEN 500 MG PO TABS: 1000.0000 mg | ORAL_TABLET | Freq: Four times a day (QID) | ORAL | Status: DC | PRN

## 2015-11-19 MED ORDER — POLYETHYLENE GLYCOL 3350 17 G PO PACK: 17.0000 g | PACK | Freq: Every day | ORAL | Status: DC

## 2015-11-19 NOTE — ED Notes (Signed)
Pt presents to ED with multiple complaints. Pt sts "My head has been hurting for a week. It just feels wheezy sometimes. I feel like I've got a pain in my throat that goes all the way down to my stomach." Pt sts the pain in throat to stomach is relieved when lying down. Pt c/o generalized abdominal pain. Pt sts "I think I have a bowel disease. I'm not constipated but I'm not normal." Pt sts he took some OTC stool softener that helped. Pt sts his girl has been trying to get him seen for awhile now. Pt A&Ox4 and ambulatory. Denies SOB and chest pain.

## 2015-11-19 NOTE — ED Provider Notes (Signed)
CSN: [id number]     Arrival date & time 11/19/15  1254 History   First MD Initiated Contact with Patient 11/19/15 1429     Chief Complaint  Patient presents with  . Headache  . Constipation     (Consider location/radiation/quality/duration/timing/severity/associated sxs/prior Treatment) HPI The patient has several general complaints. Complaint #1: Patient feels that he has a lot of abdominal distention and excessive flatulence. He also reports that he feels like he is somewhat constipated. He reports that he feels like he would feel better. Could just evacuate his colon. He reports he has had some small bowel movements but they did not seem as large as normal. No associated nausea or vomiting. No localizing pain. He reports intermittently feeling distended and crampy.  The patient also has complained of a headache. He reports he's had a headache for about a week. He states he's had problems with headaches for at least 6 months. They come and go and may last for days at a time. He reports that he usually is frontal and temples with aching at the back of his head. No associated symptoms. Occasionally feels slightly dizzy. 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 He reports he still frequently does inhale dust. He also reports he wears safety glasses which at times do seem tight. No associated fevers, chills, neck stiffness. No associated neurologic dysfunction. Intermittent nasal drainage. Patient sometimes takes Zyrtec and seems to get some relief.  Patient reports he just doesn't feel very good. He states he has low energy and feels fatigued much of the time. The patient has not had fever, chills. Patient has not taken his blood pressure medicine today. He does report usually he is very compliant however. Patient has a family doctor who assists him with blood pressure management but he reports that this physician does not see him for regular problems. Patient also has a  gastroenterologist that he sees and is due to follow up for a scheduled MRI. Past Medical History  Diagnosis Date  . Hypertension   . Acid reflux   . Hiatal hernia    Past Surgical History  Procedure Laterality Date  . Esophagogastroduodenoscopy  10/04/2007    RMR: Distal esophageal erosions consistent with erosive reflux esophagitis, patulous gastroesophageal junction status post passage of a  Maloney dilator, 36 [place].  Otherwise, unremarkable esophagus.  Hiatal hernia.  Otherwise normal stomach.  Bulbar erosion  . Colonoscopy  01/10/2009      RMR: Normal rectum, normal colon; repeat in 2015 due to [place] of colon cancer  . Colonoscopy N/A 01/09/2014    [id number] colonic polyps-removed as described  . Esophagogastroduodenoscopy N/A 01/09/2014    Erosive reflux esophagitis. Small hiatal hernia   Family History  Problem Relation Age of Onset  . Colon cancer Sister     passed away from colon cancer, in her 70s  . Diabetes Father   . Diabetes Other    Social History  Substance Use Topics  . Smoking status: Current Every Day Smoker -- 0.25 packs/day for 10 years    Types: Cigarettes  . Smokeless tobacco: Never Used  . Alcohol Use: 0.0 oz/week    0 Standard drinks or equivalent per week     Comment: occasional/wine on the weekends    Review of Systems  10 Systems reviewed and are negative for acute change except as noted in the HPI.   Allergies  Review of patient's allergies indicates no known allergies.  Home Medications  Prior to Admission medications   Medication Sig Start Date End Date Taking? Authorizing Provider  acetaminophen (TYLENOL) 500 MG tablet Take 2 tablets (1,000 mg total) by mouth every 6 (six) hours as needed. 11/19/15   Charlesetta Shanks, MD  atenolol (TENORMIN) 25 MG tablet Take 25 mg by mouth daily.    Historical Provider, MD  cetirizine (ZYRTEC ALLERGY) 10 MG tablet Take 10 mg by mouth daily as needed for allergies. Reported on 07/19/2015    Historical  Provider, MD  cetirizine (ZYRTEC) 10 MG tablet Take 1 tablet (10 mg total) by mouth daily. 11/19/15   Charlesetta Shanks, MD  dexlansoprazole (DEXILANT) 60 MG capsule Take 1 capsule (60 mg total) by mouth daily. 10/30/15   Carlis Stable, NP  polyethylene glycol (MIRALAX / GLYCOLAX) packet Take 17 g by mouth daily. 11/19/15   Charlesetta Shanks, MD   BP 149/97 mmHg  Pulse 89  Temp(Src) 98.6 F (37 C) (Oral)  Resp 18  SpO2 100% Physical Exam  Constitutional: He is oriented to person, place, and time.  Patient is moderately obese. He is alert and nontoxic. He is well groomed. He is well in appearance. No acute distress.  HENT:  Head: Normocephalic and atraumatic.  Right Ear: External ear normal.  Left Ear: External ear normal.  Nose: Nose normal.  Mouth/Throat: Oropharynx is clear and moist. No oropharyngeal exudate.  Bilateral TMs normal without erythema or bulging. No cerumen impaction. Dentition is in fair condition. No areas that appear to have active decay. Mild percussion tenderness over the right zygoma. No tenderness to palpation over the temporal artery. The neck is supple without mass or lesion.  Eyes: EOM are normal. Pupils are equal, round, and reactive to light. No scleral icterus.  Neck: Normal range of motion. Neck supple. No thyromegaly present.  Cardiovascular: Normal rate, regular rhythm, normal heart sounds and intact distal pulses.   Pulmonary/Chest: Effort normal and breath sounds normal.  Abdominal: Soft. Bowel sounds are normal. He exhibits no distension. There is no tenderness.  No tympany.  Musculoskeletal: Normal range of motion. He exhibits no edema or tenderness.  Neurological: He is alert and oriented to person, place, and time. No cranial nerve deficit. He exhibits normal muscle tone. Coordination normal.  Skin: Skin is warm and dry.  Psychiatric: He has a normal mood and affect.    ED Course  Procedures (including critical care time) Labs Review Labs Reviewed   COMPREHENSIVE METABOLIC PANEL - Abnormal; Notable for the following:    Glucose, Bld 124 (*)    All other components within normal limits  LIPASE, BLOOD  CBC  URINALYSIS, ROUTINE W REFLEX MICROSCOPIC (NOT AT [facility])    Imaging Review No results found. I have personally reviewed and evaluated these images and lab results as part of my medical decision-making.   EKG Interpretation None      MDM   Final diagnoses:  Constipation, unspecified constipation type  Nonintractable episodic headache, unspecified headache type   At this time, patient is well appearance. He has a nonemergent type headache that has been waxing and waning for 6 months. No neurologic dysfunction. No abnormality on physical exam. At this time I most suspect tension headache or possibly irritant from exposure to inhaled irritants. Patient is counseled to follow-up with family physician regarding ongoing management of headache. Suggestions are made for conservative treatment. Patient has a nonacute abdomen. He follows up regularly with gastroenterology. They have planned outpatient MRI. This time for symptoms of constipation I will  advise the patient to try MiraLAX for several days.    Charlesetta Shanks, MD 11/19/15 [phone]

## 2015-11-19 NOTE — Discharge Instructions (Signed)
Constipation, Adult Constipation is when a person has fewer than three bowel movements a week, has difficulty having a bowel movement, or has stools that are dry, hard, or larger than normal. As people grow older, constipation is more common. A low-fiber diet, not taking in enough fluids, and taking certain medicines may make constipation worse.  CAUSES   Certain medicines, such as antidepressants, pain medicine, iron supplements, antacids, and water pills.   Certain diseases, such as diabetes, irritable bowel syndrome (IBS), thyroid disease, or depression.   Not drinking enough water.   Not eating enough fiber-rich foods.   Stress or travel.   Lack of physical activity or exercise.   Ignoring the urge to have a bowel movement.   Using laxatives too much.  SIGNS AND SYMPTOMS   Having fewer than three bowel movements a week.   Straining to have a bowel movement.   Having stools that are hard, dry, or larger than normal.   Feeling full or bloated.   Pain in the lower abdomen.   Not feeling relief after having a bowel movement.  DIAGNOSIS  Your health care provider will take a medical history and perform a physical exam. Further testing may be done for severe constipation. Some tests may include:  A barium enema X-ray to examine your rectum, colon, and, sometimes, your small intestine.   A sigmoidoscopy to examine your lower colon.   A colonoscopy to examine your entire colon. TREATMENT  Treatment will depend on the severity of your constipation and what is causing it. Some dietary treatments include drinking more fluids and eating more fiber-rich foods. Lifestyle treatments may include regular exercise. If these diet and lifestyle recommendations do not help, your health care provider may recommend taking over-the-counter laxative medicines to help you have bowel movements. Prescription medicines may be prescribed if over-the-counter medicines do not work.    HOME CARE INSTRUCTIONS   Eat foods that have a lot of fiber, such as fruits, vegetables, whole grains, and beans.  Limit foods high in fat and processed sugars, such as french fries, hamburgers, cookies, candies, and soda.   A fiber supplement may be added to your diet if you cannot get enough fiber from foods.   Drink enough fluids to keep your urine clear or pale yellow.   Exercise regularly or as directed by your health care provider.   Go to the restroom when you have the urge to go. Do not hold it.   Only take over-the-counter or prescription medicines as directed by your health care provider. Do not take other medicines for constipation without talking to your health care provider first.  [place] IF:   You have bright red blood in your stool.   Your constipation lasts for more than 4 days or gets worse.   You have abdominal or rectal pain.   You have thin, pencil-like stools.   You have unexplained weight loss. MAKE SURE YOU:   Understand these instructions.  Will watch your condition.  Will get help right away if you are not doing well or get worse.   This information is not intended to replace advice given to you by your health care provider. Make sure you discuss any questions you have with your health care provider.   Suspected Tension Headache vs Sinus irritation from dust/pollen A tension headache is a feeling of pain, pressure, or aching that is often felt over the front and sides of the head. The pain can be dull,  or it can feel tight (constricting). Tension headaches are not normally associated with nausea or vomiting, and they do not get worse with physical activity. Tension headaches can last from 30 minutes to several days. This is the most common type of headache. CAUSES The exact cause of this condition is not known. Tension headaches often begin after stress, anxiety, or depression. Other triggers may  include:  Alcohol.  Too much caffeine, or caffeine withdrawal.  Respiratory infections, such as colds, flu, or sinus infections.  Dental problems or teeth clenching.  Fatigue.  Holding your head and neck in the same position for a long period of time, such as while using a computer.  Smoking. SYMPTOMS Symptoms of this condition include:  A feeling of pressure around the head.  Dull, aching head pain.  Pain felt over the front and sides of the head.  Tenderness in the muscles of the head, neck, and shoulders. DIAGNOSIS This condition may be diagnosed based on your symptoms and a physical exam. Tests may be done, such as a CT scan or an MRI of your head. These tests may be done if your symptoms are severe or unusual. TREATMENT This condition may be treated with lifestyle changes and medicines to help relieve symptoms. HOME CARE INSTRUCTIONS Managing Pain  Take over-the-counter and prescription medicines only as told by your health care provider.  Lie down in a dark, quiet room when you have a headache.  If directed, apply ice to the head and neck area:  Put ice in a plastic bag.  Place a towel between your skin and the bag.  Leave the ice on for 20 minutes, 2-3 times per day.  Use a heating pad or a hot shower to apply heat to the head and neck area as told by your health care provider. Eating and Drinking  Eat meals on a regular schedule.  Limit alcohol use.  Decrease your caffeine intake, or stop using caffeine. General Instructions  Keep all follow-up visits as told by your health care provider. This is important.  Keep a headache journal to help find out what may trigger your headaches. For example, write down:  What you eat and drink.  How much sleep you get.  Any change to your diet or medicines.  Try massage or other relaxation techniques.  Limit stress.  Sit up straight, and avoid tensing your muscles.  Do not use tobacco products, including  cigarettes, chewing tobacco, or e-cigarettes. If you need help quitting, ask your health care provider.  Exercise regularly as told by your health care provider.  Get 7-9 hours of sleep, or the amount recommended by your health care provider. SEEK MEDICAL CARE IF:  Your symptoms are not helped by medicine.  You have a headache that is different from what you normally experience.  You have nausea or you vomit.  You have a fever. SEEK IMMEDIATE MEDICAL CARE IF:  Your headache becomes severe.  You have repeated vomiting.  You have a stiff neck.  You have a loss of vision.  You have problems with speech.  You have pain in your eye or ear.  You have muscular weakness or loss of muscle control.  You lose your balance or you have trouble walking.  You feel faint or you pass out.  You have confusion.   This information is not intended to replace advice given to you by your health care provider. Make sure you discuss any questions you have with your health care provider.  Document Released: 05/12/2005 Document Revised: 01/31/2015 Document Reviewed: 09/04/2014 Elsevier Interactive Patient Education [employer].

## 2015-12-10 ENCOUNTER — Other Ambulatory Visit (HOSPITAL_COMMUNITY): Payer: Self-pay | Admitting: *Deleted

## 2015-12-10 DIAGNOSIS — R42 Dizziness and giddiness: Secondary | ICD-10-CM

## 2015-12-10 DIAGNOSIS — I10 Essential (primary) hypertension: Secondary | ICD-10-CM | POA: Diagnosis not present

## 2015-12-10 DIAGNOSIS — R7309 Other abnormal glucose: Secondary | ICD-10-CM | POA: Diagnosis not present

## 2015-12-17 ENCOUNTER — Ambulatory Visit (HOSPITAL_COMMUNITY)
Admission: RE | Admit: 2015-12-17 | Discharge: 2015-12-17 | Disposition: A | Discharge status: Home / Self Care | Payer: BLUE CROSS/BLUE SHIELD | Source: Ambulatory Visit | Attending: *Deleted | Admitting: *Deleted

## 2015-12-17 DIAGNOSIS — R42 Dizziness and giddiness: Secondary | ICD-10-CM | POA: Diagnosis not present

## 2016-01-30 ENCOUNTER — Ambulatory Visit (INDEPENDENT_AMBULATORY_CARE_PROVIDER_SITE_OTHER): Payer: BLUE CROSS/BLUE SHIELD | Admitting: Nurse Practitioner

## 2016-01-30 ENCOUNTER — Encounter: Payer: Self-pay | Admitting: Nurse Practitioner

## 2016-01-30 ENCOUNTER — Other Ambulatory Visit: Payer: Self-pay

## 2016-01-30 VITALS — BP 137/83 | HR 75 | Temp 98.2°F | Ht 69.0 in | Wt 250.8 lb

## 2016-01-30 DIAGNOSIS — K2100 Gastro-esophageal reflux disease with esophagitis, without bleeding: Secondary | ICD-10-CM

## 2016-01-30 DIAGNOSIS — K5900 Constipation, unspecified: Secondary | ICD-10-CM

## 2016-01-30 DIAGNOSIS — K625 Hemorrhage of anus and rectum: Secondary | ICD-10-CM

## 2016-01-30 DIAGNOSIS — R932 Abnormal findings on diagnostic imaging of liver and biliary tract: Secondary | ICD-10-CM | POA: Diagnosis not present

## 2016-01-30 NOTE — Assessment & Plan Note (Signed)
Patient has intermittent constipation. We'll go well for some time and then have a week where he does not go very well. This point he likely does not need a daily medication. Take MiraLAX 17 g once a day as needed for breakthrough constipation. Return for follow-up in 3 months.

## 2016-01-30 NOTE — Assessment & Plan Note (Signed)
The patient has intermittent rectal bleeding which is not persistent. Has a history of hemorrhoids. Recommend call us if he has a hemorrhoid flare or has new onset constipation and we can send in Anusol suppositories. Colonoscopy in 2015 and recommended 3 year repeat so he will be due next year. Continue to monitor, return for follow-up in 3 months.

## 2016-01-30 NOTE — Patient Instructions (Signed)
1. I am giving you samples of Dexilant for a week and a copay card which can help reduce your costs for Dexilant. 2. We will schedule an MRI for you 3. Take Miralax 17gm (1 capful) once a day as needed for on/off constipation. 4. Return for follow-up in 3 months.

## 2016-01-30 NOTE — Progress Notes (Signed)
Referring Provider: Raiford Simmonds., PA-C Primary Care Physician:  Raiford Simmonds., PA-C Primary GI:  Dr. Gala Romney  Chief Complaint  Patient presents with  . Follow-up  . Gastroesophageal Reflux    HPI:   [name] is a 45 y.o. male who presents For follow-up on GERD. He was last seen in our office 10/30/2015 at which point he was doing well. Stated previously that he did not get Dexon because it was too expensive at which point he was reminded that he was provided a co-pay card but states it "I think so we'll probably misplaced it." He had gotten his MRI scheduled because of cost thousand dollars although he does want it done. Desires open MRI in [place] as he has some element of claustrophobia. He noted he would call to have his MRI scheduled. He was provided mother co-pay card for [place]. Recommended 3 month follow-up.  Today he states he had lost the copay card again. Is having persistent breakthrough symptoms on attempted OTC PPIs. Last EGD 2015 recommended Dexilant because patient had been non-compliant on medications. Also wants to schedule MRI today, prefers wide-bore MRI in [place]. Having persistent epigastric pain, esophageal burning, occasional nausea. No vomiting. Has a history of hemorrhoids and has some scant toilet tissue hematochezia with episode of constipation. Does have regular constipation. Denies melena. Denies chest pain, dyspnea, dizziness, lightheadedness, syncope, near syncope. Denies any other upper or lower GI symptoms.  Past Medical History:  Diagnosis Date  . Acid reflux   . Hiatal hernia   . Hypertension     Past Surgical History:  Procedure Laterality Date  . COLONOSCOPY  01/10/2009     RMR: Normal rectum, normal colon; repeat in 2015 due to [place] of colon cancer  . COLONOSCOPY N/A 01/09/2014   [id number] colonic polyps-removed as described  . ESOPHAGOGASTRODUODENOSCOPY  10/04/2007   RMR: Distal esophageal erosions consistent with erosive  reflux esophagitis, patulous gastroesophageal junction status post passage of a  Maloney dilator, 56 [place].  Otherwise, unremarkable esophagus.  Hiatal hernia.  Otherwise normal stomach.  Bulbar erosion  . ESOPHAGOGASTRODUODENOSCOPY N/A 01/09/2014   Erosive reflux esophagitis. Small hiatal hernia    Current Outpatient Prescriptions  Medication Sig Dispense Refill  . acetaminophen (TYLENOL) 500 MG tablet Take 2 tablets (1,000 mg total) by mouth every 6 (six) hours as needed. 30 tablet 0  . atenolol (TENORMIN) 25 MG tablet Take 25 mg by mouth daily.    . cetirizine (ZYRTEC ALLERGY) 10 MG tablet Take 10 mg by mouth daily as needed for allergies. Reported on 07/19/2015    . cetirizine (ZYRTEC) 10 MG tablet Take 1 tablet (10 mg total) by mouth daily. 14 tablet 1  . polyethylene glycol (MIRALAX / GLYCOLAX) packet Take 17 g by mouth daily. 14 each 0  . dexlansoprazole (DEXILANT) 60 MG capsule Take 1 capsule (60 mg total) by mouth daily. (Patient not taking: Reported on 01/30/2016) 90 capsule 3   No current facility-administered medications for this visit.     Allergies as of 01/30/2016  . (No Known Allergies)    Family History  Problem Relation Age of Onset  . Colon cancer Sister     passed away from colon cancer, in her 77s  . Diabetes Father   . Diabetes Other     Social History   Social History  . Marital status: Single    Spouse name: N/A  . Number of children: N/A  . Years of education: N/A   Occupational  History  . [employer] in [place] History Main Topics  . Smoking status: Current Every Day Smoker    Packs/day: 0.25    Years: 10.00    Types: Cigarettes  . Smokeless tobacco: Never Used  . Alcohol use 0.0 oz/week     Comment: occasional/wine on the weekends  . Drug use: No  . Sexual activity: Not Asked   Other Topics Concern  . None   Social History Narrative  . None    Review of Systems: General: Negative for  anorexia, weight loss, fever, chills, fatigue, weakness. ENT: Negative for hoarseness, difficulty swallowing. CV: Negative for chest pain, angina, palpitations, peripheral edema.  Respiratory: Negative for dyspnea at rest, cough, sputum, wheezing.  GI: See history of present illness. Endo: Negative for unusual weight change.  Heme: Negative for bruising or bleeding.   Physical Exam: BP 137/83   Pulse 75   Temp 98.2 F (36.8 C) (Oral)   Ht 5\' 9"  (1.753 m)   Wt 250 lb 12.8 oz (113.8 kg)   BMI 37.04 kg/m  General:   Alert and oriented. Pleasant and cooperative. Well-nourished and well-developed.  Eyes:  Without icterus, sclera clear and conjunctiva pink.  Ears:  Normal auditory acuity. Cardiovascular:  S1, S2 present without murmurs appreciated. Extremities without clubbing or edema. Respiratory:  Clear to auscultation bilaterally. No wheezes, rales, or rhonchi. No distress.  Gastrointestinal:  +BS, rounded but soft, non-tender and non-distended. No HSM noted. No guarding or rebound.  Rectal:  Deferred  Musculoskalatal:  Symmetrical without gross deformities. Neurologic:  Alert and oriented x4;  grossly normal neurologically. Psych:  Alert and cooperative. Normal mood and affect. Heme/Lymph/Immune: No excessive bruising noted.    01/30/2016 8:44 AM   Disclaimer: This note was dictated with voice recognition software. Similar sounding words can inadvertently be transcribed and may not be corrected upon review.

## 2016-01-30 NOTE — Assessment & Plan Note (Signed)
Patient with a history of refractory GERD. Over the past 6-12 months we have been trying persistently to get him on Dexilant as previously recommended. When he takes it via samples it tends to improve his symptoms. Has failed multiple other PPIs. We have given him to co-pay cards over the last 2 visits which she seems to misplace repeatedly. We will give him one week worth of samples and another co-pay card. Return for follow-up in 3 months.

## 2016-01-30 NOTE — Progress Notes (Signed)
cc'ed to pcp °

## 2016-02-06 DIAGNOSIS — S161XXA Strain of muscle, fascia and tendon at neck level, initial encounter: Secondary | ICD-10-CM | POA: Diagnosis not present

## 2016-02-06 DIAGNOSIS — Y9389 Activity, other specified: Secondary | ICD-10-CM | POA: Diagnosis not present

## 2016-02-07 ENCOUNTER — Ambulatory Visit (HOSPITAL_COMMUNITY): Admission: RE | Admit: 2016-02-07 | Payer: BLUE CROSS/BLUE SHIELD | Source: Ambulatory Visit

## 2016-02-13 DIAGNOSIS — M50322 Other cervical disc degeneration at C5-C6 level: Secondary | ICD-10-CM | POA: Diagnosis not present

## 2016-02-15 DIAGNOSIS — S161XXA Strain of muscle, fascia and tendon at neck level, initial encounter: Secondary | ICD-10-CM | POA: Diagnosis not present

## 2016-02-15 DIAGNOSIS — I10 Essential (primary) hypertension: Secondary | ICD-10-CM | POA: Diagnosis not present

## 2016-02-15 DIAGNOSIS — Z720 Tobacco use: Secondary | ICD-10-CM | POA: Diagnosis not present

## 2016-02-22 DIAGNOSIS — M546 Pain in thoracic spine: Secondary | ICD-10-CM | POA: Diagnosis not present

## 2016-02-22 DIAGNOSIS — M25511 Pain in right shoulder: Secondary | ICD-10-CM | POA: Diagnosis not present

## 2016-02-22 DIAGNOSIS — M542 Cervicalgia: Secondary | ICD-10-CM | POA: Diagnosis not present

## 2016-02-25 DIAGNOSIS — S161XXA Strain of muscle, fascia and tendon at neck level, initial encounter: Secondary | ICD-10-CM | POA: Diagnosis not present

## 2016-03-03 DIAGNOSIS — M546 Pain in thoracic spine: Secondary | ICD-10-CM | POA: Diagnosis not present

## 2016-03-03 DIAGNOSIS — M25511 Pain in right shoulder: Secondary | ICD-10-CM | POA: Diagnosis not present

## 2016-03-03 DIAGNOSIS — M542 Cervicalgia: Secondary | ICD-10-CM | POA: Diagnosis not present

## 2016-03-17 DIAGNOSIS — M546 Pain in thoracic spine: Secondary | ICD-10-CM | POA: Diagnosis not present

## 2016-03-17 DIAGNOSIS — M25511 Pain in right shoulder: Secondary | ICD-10-CM | POA: Diagnosis not present

## 2016-03-17 DIAGNOSIS — M542 Cervicalgia: Secondary | ICD-10-CM | POA: Diagnosis not present

## 2016-04-01 ENCOUNTER — Other Ambulatory Visit: Payer: Self-pay

## 2016-04-01 DIAGNOSIS — R9389 Abnormal findings on diagnostic imaging of other specified body structures: Secondary | ICD-10-CM

## 2016-04-01 DIAGNOSIS — K2100 Gastro-esophageal reflux disease with esophagitis, without bleeding: Secondary | ICD-10-CM

## 2016-04-01 MED ORDER — DEXLANSOPRAZOLE 60 MG PO CPDR: 60.0000 mg | DELAYED_RELEASE_CAPSULE | Freq: Every day | ORAL | 3 refills | Status: DC

## 2016-04-30 ENCOUNTER — Encounter: Payer: Self-pay | Admitting: Nurse Practitioner

## 2016-04-30 ENCOUNTER — Telehealth: Payer: Self-pay

## 2016-04-30 ENCOUNTER — Ambulatory Visit (INDEPENDENT_AMBULATORY_CARE_PROVIDER_SITE_OTHER): Payer: BLUE CROSS/BLUE SHIELD | Admitting: Nurse Practitioner

## 2016-04-30 VITALS — BP 150/96 | HR 74 | Temp 98.2°F | Ht 69.0 in | Wt 259.8 lb

## 2016-04-30 DIAGNOSIS — Z800 Family history of malignant neoplasm of digestive organs: Secondary | ICD-10-CM | POA: Diagnosis not present

## 2016-04-30 DIAGNOSIS — R9389 Abnormal findings on diagnostic imaging of other specified body structures: Secondary | ICD-10-CM

## 2016-04-30 DIAGNOSIS — R938 Abnormal findings on diagnostic imaging of other specified body structures: Secondary | ICD-10-CM | POA: Diagnosis not present

## 2016-04-30 DIAGNOSIS — R079 Chest pain, unspecified: Secondary | ICD-10-CM

## 2016-04-30 DIAGNOSIS — K625 Hemorrhage of anus and rectum: Secondary | ICD-10-CM | POA: Diagnosis not present

## 2016-04-30 NOTE — Telephone Encounter (Signed)
Called and informed pt of MRI abdomen scheduled for 05/07/16 at 10:00am at [facility] (pt requested [facility] d/t machine at [facility] is too small). NPO after midnight. Letter mailed.

## 2016-04-30 NOTE — Patient Instructions (Signed)
No PA needed for MRI abd. Ref# [id number]

## 2016-04-30 NOTE — Progress Notes (Signed)
Referring Provider: Raiford Simmonds., PA-C Primary Care Physician:  Raiford Simmonds., PA-C Primary GI:  Dr. Gala Romney  Chief Complaint  Patient presents with  . Bloated    feels swollen  . Abdominal Pain    across upper abd  . Constipation    at times    HPI:   [name] is a 45 y.o. male who presents for follow-up on abdominal pain, constipation, abnormal ultrasound. The patient was last seen in our office 01/30/2016 for GERD, constipation, rectal bleeding, abnormal liver ultrasound. At time of her last visit it was noted that previously recommended Dexilant was not obtained because it was too expensive although a co-pay card was provided he stated they probably misplaced it. MRI was not schedule because of cost and requested MRI in [place] due to claustrophobia and agreed he would have his MRI scheduled. At his last visit he stated he again lost his co-pay card for [place]. Noted persistent breakthrough GERD symptoms, has tried over-the-counter PPIs. Last EGD up-to-date 2015 recommended Dexilant. Also had yet to schedule MRI. Noted hemorrhoids with scant toilet tissue hematochezia with regular constipation. Again recommended Dexilant, provided 1 week of samples and another co-pay card. Proceed with scheduling MRI, MiraLAX as needed, return for follow-up in 3 months. Patient offered Anusol suppositories and indicated he can call and ask [place] for this at any time.  Review of the chart shows the patient, again, has yet to have MRI completed. To review, her abnormal abdominal ultrasound showed a 12 mm indeterminate liver lesion and recommended hemangioma protocol CT or MRI.  Today she states he hasn't done his MRI yet. Reviewed the importance of this (again) and he states he will have it done (again). Has started taking Dexilant and it has helped his GERD. Is having upper abdominal pain and bloating as well. Pain is described as "gripping" pain or "irritation." Issue is constnt with  intermittent worsening/improvement. Occasional associated nausea. Is taking MiraLax, has a bowel movement daily but notes sensation of incomplete emptying. Does have occasional lower abdominal pain as well. Denies acute changes in bowel habits. Has occasional hematochezia and notes hemorrhoid symptoms. Denies melena, fever, chills, unintentional weight loss. Admits he needs to lose 20-30 pounds. Denies dizziness, lightheadedness, syncope, near syncope. Denies any other upper or lower GI symptoms.  He admits chest pain and dyspnea which occurs about every other, sometimes associated with activity other times not. When it is associated with movement, occasionally improves with rest. NOT CURRENTLY HAVING CHEST PAIN AT THIS TIME. He has not informed his PCP of his symptoms.  Past Medical History:  Diagnosis Date  . Acid reflux   . Hiatal hernia   . Hypertension     Past Surgical History:  Procedure Laterality Date  . COLONOSCOPY  01/10/2009     RMR: Normal rectum, normal colon; repeat in 2015 due to [place] of colon cancer  . COLONOSCOPY N/A 01/09/2014   [id number] colonic polyps-removed as described  . ESOPHAGOGASTRODUODENOSCOPY  10/04/2007   RMR: Distal esophageal erosions consistent with erosive reflux esophagitis, patulous gastroesophageal junction status post passage of a  Maloney dilator, 16 [place].  Otherwise, unremarkable esophagus.  Hiatal hernia.  Otherwise normal stomach.  Bulbar erosion  . ESOPHAGOGASTRODUODENOSCOPY N/A 01/09/2014   Erosive reflux esophagitis. Small hiatal hernia    Current Outpatient Prescriptions  Medication Sig Dispense Refill  . atenolol (TENORMIN) 25 MG tablet Take 25 mg by mouth daily.    . cetirizine (ZYRTEC ALLERGY) 10 MG tablet Take  10 mg by mouth daily as needed for allergies. Reported on 07/19/2015    . dexlansoprazole (DEXILANT) 60 MG capsule Take 1 capsule (60 mg total) by mouth daily. 90 capsule 3  . polyethylene glycol (MIRALAX / GLYCOLAX) packet Take 17  g by mouth daily. (Patient taking differently: Take 17 g by mouth daily as needed. ) 14 each 0   No current facility-administered medications for this visit.     Allergies as of 04/30/2016  . (No Known Allergies)    Family History  Problem Relation Age of Onset  . Colon cancer Sister     passed away from colon cancer, in her 35s  . Diabetes Father   . Diabetes Other     Social History   Social History  . Marital status: Single    Spouse name: N/A  . Number of children: N/A  . Years of education: N/A   Occupational History  . [employer] in [place] History Main Topics  . Smoking status: Current Every Day Smoker    Packs/day: 0.25    Years: 10.00    Types: Cigarettes  . Smokeless tobacco: Never Used  . Alcohol use 0.0 oz/week     Comment: occasional/wine on the weekends  . Drug use: No  . Sexual activity: Not Asked   Other Topics Concern  . None   Social History Narrative  . None    Review of Systems: General: Negative for anorexia, weight loss, fever, chills, fatigue, weakness. ENT: Negative for hoarseness, difficulty swallowing. CV: Negative for palpitations, peripheral edema.  Respiratory: Negative for dyspnea at rest, cough, sputum, wheezing.  GI: See history of present illness. Endo: Negative for unusual weight change.  Heme: Negative for bruising or bleeding.   Physical Exam: BP (!) 150/96   Pulse 74   Temp 98.2 F (36.8 C) (Oral)   Ht 5\' 9"  (1.753 m)   Wt 259 lb 12.8 oz (117.8 kg)   BMI 38.37 kg/m  General:   Alert and oriented. Pleasant and cooperative. Well-nourished and well-developed.  Eyes:  Without icterus, sclera clear and conjunctiva pink.  Ears:  Normal auditory acuity. Cardiovascular:  S1, S2 present without murmurs appreciated. Extremities without clubbing or edema. Respiratory:  Clear to auscultation bilaterally. No wheezes, rales, or rhonchi. No distress.  Gastrointestinal:  +BS,  soft, and non-distended. No HSM noted. No guarding or rebound. No masses appreciated.  Rectal:  Deferred  Musculoskalatal:  Symmetrical without gross deformities. Neurologic:  Alert and oriented x4;  grossly normal neurologically. Psych:  Alert and cooperative. Normal mood and affect. Heme/Lymph/Immune: No excessive bruising noted.    04/30/2016 9:50 AM   Disclaimer: This note was dictated with voice recognition software. Similar sounding words can inadvertently be transcribed and may not be corrected upon review.

## 2016-04-30 NOTE — Patient Instructions (Signed)
1. Complete your MRI as soon as you can 2. Continue Dexilant every day 3. Continue Miralax as needed 4. We will refer you to cardiology for further evaluation of your chest pain/shortness of breath 5. We will see you back in 4 weeks to schedule colonoscopy once you're seen/cleared by cardiology.

## 2016-05-01 DIAGNOSIS — R079 Chest pain, unspecified: Secondary | ICD-10-CM | POA: Insufficient documentation

## 2016-05-01 DIAGNOSIS — R0789 Other chest pain: Secondary | ICD-10-CM | POA: Insufficient documentation

## 2016-05-01 DIAGNOSIS — Z800 Family history of malignant neoplasm of digestive organs: Secondary | ICD-10-CM | POA: Insufficient documentation

## 2016-05-01 NOTE — Assessment & Plan Note (Signed)
The patient notes occasional hematochezia and admits some hemorrhoid symptoms. Likely benign anorectal source, however given his family history of colon cancer as per below and the fact he is an African-American male age 45 he should undergo colonoscopy at this time. He does admit chest pain intermittently as well as dyspnea intermittently associated with exertion. This is not been evaluated and I'll refer him to cardiology for evaluation to ensure no cardiac etiology or increased risk during procedure. We'll receive clearance from cardiology we can proceed with scheduling. I will have him return in 4 weeks to insure scheduling of his colonoscopy is completed.  In the meantime I have reviewed the risks and benefits of the procedure with him.

## 2016-05-01 NOTE — Assessment & Plan Note (Signed)
Patient admits chest pain and dyspnea, sometimes associated with activity and sometimes not. When it is associated with exertion it typically improves with rest. He is not currently having chest pain in the office today. He has not informed his primary care of these symptoms. I will refer him to cardiology for further workup for cardiac etiology. Pending their evaluation and clearance for colonoscopy we will schedule colonoscopy as per above. Return for follow-up in 4 weeks in order to ensure scheduling.

## 2016-05-01 NOTE — Assessment & Plan Note (Signed)
The patient again has yet to complete MRI to follow-up on abnormal ultrasound of the liver. I had a frank discussion with him about the importance of this and he insists that he will get it done. Return for follow-up in 4 weeks.

## 2016-05-01 NOTE — Assessment & Plan Note (Signed)
Today he notes a new occurrence of family history of colon cancer in his sister who passed away from colon cancer in her 53s. He is an African-American male, age 45, no previous colonoscopy and now with rectal bleeding as per above. Further evaluation, including colonoscopy, as per above. Return for follow-up in 4 weeks to ensure scheduling.

## 2016-05-05 ENCOUNTER — Ambulatory Visit (HOSPITAL_COMMUNITY): Payer: BLUE CROSS/BLUE SHIELD

## 2016-05-05 NOTE — Progress Notes (Signed)
CC'D TO PCP °

## 2016-05-07 ENCOUNTER — Ambulatory Visit (HOSPITAL_COMMUNITY): Admission: RE | Admit: 2016-05-07 | Payer: BLUE CROSS/BLUE SHIELD | Source: Ambulatory Visit

## 2016-05-12 NOTE — Telephone Encounter (Signed)
Letter for MRI abdomen returned in mail to office (no mail receptacle, unable to forward). When appt for 05/07/16 was reviewed--pt was no show. Called pt and LMOVM. Gave pt phone number to Central Scheduling to call if he wants to reschedule appt.

## 2016-05-15 DIAGNOSIS — J069 Acute upper respiratory infection, unspecified: Secondary | ICD-10-CM | POA: Diagnosis not present

## 2016-05-27 ENCOUNTER — Ambulatory Visit (HOSPITAL_COMMUNITY)
Admission: RE | Admit: 2016-05-27 | Discharge: 2016-05-27 | Disposition: A | Discharge status: Home / Self Care | Payer: BLUE CROSS/BLUE SHIELD | Source: Ambulatory Visit | Attending: Nurse Practitioner | Admitting: Nurse Practitioner

## 2016-05-27 DIAGNOSIS — R932 Abnormal findings on diagnostic imaging of liver and biliary tract: Secondary | ICD-10-CM | POA: Diagnosis present

## 2016-05-27 DIAGNOSIS — N281 Cyst of kidney, acquired: Secondary | ICD-10-CM | POA: Insufficient documentation

## 2016-05-27 DIAGNOSIS — K7689 Other specified diseases of liver: Secondary | ICD-10-CM | POA: Insufficient documentation

## 2016-05-27 DIAGNOSIS — K760 Fatty (change of) liver, not elsewhere classified: Secondary | ICD-10-CM | POA: Diagnosis not present

## 2016-05-27 LAB — CREATININE, SERUM
Creatinine, Ser: 1.18 mg/dL (ref 0.61–1.24)
GFR calc Af Amer: >60 mL/min (ref >60)
GFR calc non Af Amer: >60 mL/min (ref >60)

## 2016-05-27 MED ORDER — GADOBENATE DIMEGLUMINE 529 MG/ML IV SOLN
20.0000 mL | Freq: Once | INTRAVENOUS | Status: AC | PRN
  Administered 2016-05-27: 20 mL via INTRAVENOUS

## 2016-06-02 ENCOUNTER — Telehealth: Payer: Self-pay

## 2016-06-02 NOTE — Telephone Encounter (Signed)
Pt is wanting to know the results of the MRI. Please advise

## 2016-06-03 ENCOUNTER — Ambulatory Visit: Payer: BLUE CROSS/BLUE SHIELD | Admitting: Nurse Practitioner

## 2016-06-04 NOTE — Telephone Encounter (Signed)
[name], please cc pcp and nic

## 2016-06-04 NOTE — Telephone Encounter (Signed)
See result note. Please send a copy of the MRI to PCP and Nic him for 12 month follow-up MRI to assess for any changes.

## 2016-06-05 NOTE — Telephone Encounter (Signed)
cc'ed to pcp and reminder in epic °

## 2016-06-05 NOTE — Progress Notes (Signed)
ON RECALL  °

## 2016-06-19 ENCOUNTER — Other Ambulatory Visit: Payer: Self-pay

## 2016-06-19 ENCOUNTER — Encounter: Payer: Self-pay | Admitting: Nurse Practitioner

## 2016-06-19 ENCOUNTER — Ambulatory Visit (INDEPENDENT_AMBULATORY_CARE_PROVIDER_SITE_OTHER): Payer: BLUE CROSS/BLUE SHIELD | Admitting: Nurse Practitioner

## 2016-06-19 VITALS — BP 157/92 | HR 90 | Temp 98.4°F | Ht 69.0 in | Wt 258.4 lb

## 2016-06-19 DIAGNOSIS — K5900 Constipation, unspecified: Secondary | ICD-10-CM | POA: Diagnosis not present

## 2016-06-19 DIAGNOSIS — R079 Chest pain, unspecified: Secondary | ICD-10-CM | POA: Diagnosis not present

## 2016-06-19 DIAGNOSIS — K625 Hemorrhage of anus and rectum: Secondary | ICD-10-CM

## 2016-06-19 DIAGNOSIS — Z800 Family history of malignant neoplasm of digestive organs: Secondary | ICD-10-CM | POA: Diagnosis not present

## 2016-06-19 NOTE — Assessment & Plan Note (Signed)
High risk due to family history of colon cancer. Last colonoscopy August 2015 with multiple polyps and recommended 3 year repeat. He will be due in August of this year but given his constipation and rectal bleeding we will proceed a little sooner than that. He will need to be cleared by cardiology first to chest pain as noted below. We will refer her again to cardiology and have him return for follow-up when he is seen cardiology.

## 2016-06-19 NOTE — Patient Instructions (Signed)
1. Call the new primary care doctor and see if you can get an appointment. 2. We will refer you to a cardiologist. 3. When you receive a cardiology appointment call our office and schedule an appointment with [place] for 1-2 weeks after this so that we can get your colonoscopy scheduled. 4. Call with any new or worsening symptoms.

## 2016-06-19 NOTE — Assessment & Plan Note (Signed)
Rectal bleeding has improved with improvement in constipation. He is still high risk due to family history of colon cancer and is coming due for his next colonoscopy. I would prefer to proceed with his colonoscopy sooner rather than in August but will need to have cardiology clearance first. Return for follow-up after cardiology appointment to schedule colonoscopy.

## 2016-06-19 NOTE — Assessment & Plan Note (Signed)
He denies continued chest pain although he states his last episode was one week ago. His pain is left upper chest. He does have some risk factors for cardiac disease including obesity, hypertension, smoking history/currently smoking. Given his intermittent chest pain I would feel much better about undergoing colonoscopy after cardiology evaluation, risk factors trifurcation, and clearance. We will refer him to cardiology to have this done. I've asked him when he receives his appointment day for cardiology to call us and schedule an appointment to follow-up with [place] in 1-2 weeks after his cardiology appointment so we can proceed with scheduling colonoscopy.

## 2016-06-19 NOTE — Progress Notes (Signed)
Referring Provider: Raiford Simmonds., PA-C Primary Care Physician:  Raiford Simmonds., PA-C Primary GI:  Dr. Gala Romney  Chief Complaint  Patient presents with  . Constipation    HPI:   [name] is a 46 y.o. male who presents for follow-up on constipation and rectal bleeding. The patient was last seen in our office 04/30/2016 for rectal bleeding, family history of colon cancer, and abnormal ultrasound. He had an abnormal ultrasound that showed a 12 mm indeterminate liver lesion and had previously been recommended to have a hemangioma protocol CT or MRI for further evaluation. He continued to not follow advice and have an MRI. It was reviewed with him the importance of having this done, again. He stated he would have it done. Dexon is helped GERD, abdominal pain, bloating. Some abdominal pain and associated occasional nausea, taking MiraLAX with daily bowel movement but noted sensation of incomplete emptying. Some rectal bleeding with a history of hemorrhoids. Recommended colonoscopy but given that he was having intermittent chest pain he was referred to cardiology for evaluation. Recommended continue Dexilant and MiraLAX. Return for follow-up in 4 weeks to schedule colonoscopy once he has been seen by cardiology.  He did have an MRI on 05/28/2016 which noted the area of concern to be benign minimally complex small left liver lobe cyst without suspicious liver masses, mild diffuse hepatic steatosis. Also noted benign small Bosniac category 2 hemorrhagic/proteinaceous upper left renal cyst. The report was forwarded to his primary care to follow-up on the renal cyst. He was neck for a 12 month repeat MRI to ensure no changes in the liver lesion.  Today he states he's doing well. Had his MRI done. Has the name of a local PCP he is going to try and get established with. Has not seen cardiology but hasn't gotten a call to schedule an appointment. He has started trying to eat better and exercise.  Still with some constipation, is on MiraLAX and is a bit improved. He has also increased dietary fiber. Has not seen any more rectal bleeding. Occasional RUQ abdominal pain which is brief lasting at most 1 minute. He has a hernia. GERD well controlled on PPI. Some nausea, though improved. No vomiting. Pain and nausea not associated with eating. He has not had any more chest pain, last episode about 1 week ago left upper chest. He thinks it is due to his smoking. Occasional shortness of breath, occasional dizziness. Denies syncope, near syncope. Denies any other upper or lower GI symptoms.   Past Medical History:  Diagnosis Date  . Acid reflux   . Hiatal hernia   . Hypertension     Past Surgical History:  Procedure Laterality Date  . COLONOSCOPY  01/10/2009     RMR: Normal rectum, normal colon; repeat in 2015 due to [place] of colon cancer  . COLONOSCOPY N/A 01/09/2014   [id number] colonic polyps-removed as described  . ESOPHAGOGASTRODUODENOSCOPY  10/04/2007   RMR: Distal esophageal erosions consistent with erosive reflux esophagitis, patulous gastroesophageal junction status post passage of a  Maloney dilator, 35 [place].  Otherwise, unremarkable esophagus.  Hiatal hernia.  Otherwise normal stomach.  Bulbar erosion  . ESOPHAGOGASTRODUODENOSCOPY N/A 01/09/2014   Erosive reflux esophagitis. Small hiatal hernia    Current Outpatient Prescriptions  Medication Sig Dispense Refill  . atenolol (TENORMIN) 25 MG tablet Take 25 mg by mouth daily.    . polyethylene glycol (MIRALAX / GLYCOLAX) packet Take 17 g by mouth daily. (Patient taking differently: Take 17 g  by mouth daily as needed. ) 14 each 0  . cetirizine (ZYRTEC ALLERGY) 10 MG tablet Take 10 mg by mouth daily as needed for allergies. Reported on 07/19/2015    . dexlansoprazole (DEXILANT) 60 MG capsule Take 1 capsule (60 mg total) by mouth daily. (Patient not taking: Reported on 06/19/2016) 90 capsule 3   No current facility-administered  medications for this visit.     Allergies as of 06/19/2016  . (No Known Allergies)    Family History  Problem Relation Age of Onset  . Colon cancer Sister     passed away from colon cancer, in her 76s  . Diabetes Father   . Diabetes Other     Social History   Social History  . Marital status: Single    Spouse name: N/A  . Number of children: N/A  . Years of education: N/A   Occupational History  . [employer] in [place] History Main Topics  . Smoking status: Current Every Day Smoker    Packs/day: 0.25    Years: 10.00    Types: Cigarettes  . Smokeless tobacco: Never Used  . Alcohol use 0.0 oz/week     Comment: occasional/wine on the weekends  . Drug use: No  . Sexual activity: Not Asked   Other Topics Concern  . None   Social History Narrative  . None    Review of Systems: General: Negative for anorexia, weight loss, fever, chills, fatigue, weakness. ENT: Negative for hoarseness, difficulty swallowing. CV: Negative for chest pain, angina, palpitations, peripheral edema.  Respiratory: Negative for dyspnea at rest, cough, sputum, wheezing.  GI: See history of present illness. Endo: Negative for unusual weight change.  Heme: Negative for bruising or bleeding.   Physical Exam: BP (!) 157/92   Pulse 90   Temp 98.4 F (36.9 C) (Oral)   Ht 5\' 9"  (1.753 m)   Wt 258 lb 6.4 oz (117.2 kg)   BMI 38.16 kg/m  General:   Obese male. Alert and oriented. Pleasant and cooperative. Well-nourished and well-developed.  Eyes:  Without icterus, sclera clear and conjunctiva pink.  Ears:  Normal auditory acuity. Cardiovascular:  S1, S2 present without murmurs appreciated. Extremities without clubbing or edema. Respiratory:  Clear to auscultation bilaterally. No wheezes, rales, or rhonchi. No distress.  Gastrointestinal:  +BS, obese but soft, and non-distended. Minimal lower abdominal TTP. No HSM noted. No guarding or rebound.  No masses appreciated.  Rectal:  Deferred  Musculoskalatal:  Symmetrical without gross deformities. Neurologic:  Alert and oriented x4;  grossly normal neurologically. Psych:  Alert and cooperative. Normal mood and affect. Heme/Lymph/Immune: No excessive bruising noted.    06/19/2016 9:22 AM   Disclaimer: This note was dictated with voice recognition software. Similar sounding words can inadvertently be transcribed and may not be corrected upon review.

## 2016-06-19 NOTE — Progress Notes (Signed)
cc'ed to pcp °

## 2016-06-19 NOTE — Assessment & Plan Note (Signed)
Constipation improved with increased water, dietary fiber, MiraLAX. Has a daily bowel movement now tends to go "pretty good." Some intermittent breakthrough symptoms but he is able to tolerate them. Return for follow-up after cardiology appointment.

## 2016-06-30 ENCOUNTER — Telehealth: Payer: Self-pay

## 2016-06-30 NOTE — Telephone Encounter (Signed)
[facility] has been unable to reach pt by phone to schedule appt. Letter mailed to pt to call and schedule cardiology appt.

## 2016-07-01 ENCOUNTER — Encounter: Payer: Self-pay | Admitting: Cardiology

## 2016-07-30 ENCOUNTER — Encounter: Payer: Self-pay | Admitting: Cardiology

## 2016-07-30 NOTE — Progress Notes (Signed)
Cardiology Office Note  Date: 07/31/2016   ID: [name], DOB May 26, 1971, MRN [id number]  PCP: Raiford Simmonds., PA-C  Referring provider: Walden Field, NP Consulting Cardiologist: Rozann Lesches, MD   Chief Complaint  Patient presents with  . History of chest pain    History of Present Illness: [name] is a 46 y.o. male referred for cardiology consultation by Mr. Gordy Levan NP with a history of chest pain. It is anticipated that he will need a colonoscopy and preprocedure cardiac assessment has been requested. I reviewed the recent note from January.  He states that he works at [employer] in [place], does heavy lifting on a regular basis, 50 pound objects. With this level of activity he does not report any exertional chest pain. The chest pain he describes is mild, dull, left-sided, sporadic, usually lasting a few minutes without obvious precipitant. He states that this has been ongoing for several months without worsening pattern. He has no unusual shortness of breath with activity. States that he did undergo stress testing several years ago.  I reviewed his medications which are outlined below. Currently ran out of atenolol, is due to see his PCP next week to get a refill. States that he has a long-standing history of hypertension.  I personally reviewed his ECG today which shows sinus rhythm with diffuse ST-T wave changes, likely repolarization abnormalities.  Past Medical History:  Diagnosis Date  . GERD (gastroesophageal reflux disease)   . Hepatic cyst    Benign by MRI  . Hiatal hernia   . History of colonic polyps   . Hypertension   . Rectal bleeding     Past Surgical History:  Procedure Laterality Date  . COLONOSCOPY  01/10/2009     RMR: Normal rectum, normal colon; repeat in 2015 due to [place] of colon cancer  . COLONOSCOPY N/A 01/09/2014   [id number] colonic polyps-removed as described  . ESOPHAGOGASTRODUODENOSCOPY  10/04/2007   RMR: Distal esophageal erosions consistent with erosive reflux esophagitis, patulous gastroesophageal junction status post passage of a  Maloney dilator, 28 [place].  Otherwise, unremarkable esophagus.  Hiatal hernia.  Otherwise normal stomach.  Bulbar erosion  . ESOPHAGOGASTRODUODENOSCOPY N/A 01/09/2014   Erosive reflux esophagitis. Small hiatal hernia    Current Outpatient Prescriptions  Medication Sig Dispense Refill  . atenolol (TENORMIN) 25 MG tablet Take 25 mg by mouth daily.    [name] dexlansoprazole (DEXILANT) 60 MG capsule Take 1 capsule (60 mg total) by mouth daily. 90 capsule 3  . polyethylene glycol (MIRALAX / GLYCOLAX) packet Take 17 g by mouth daily. (Patient taking differently: Take 17 g by mouth daily as needed. ) 14 each 0   No current facility-administered medications for this visit.    Allergies:  Patient has no known allergies.   Social History: The patient  reports that he has been smoking Cigarettes.  He has a 2.50 pack-year smoking history. He has never used smokeless tobacco. He reports that he drinks alcohol. He reports that he does not use drugs.   Family History: The patient's family history includes Colon cancer in his sister; Diabetes in his father and other.   ROS:  Please see the history of present illness. Otherwise, complete review of systems is positive for none.  All other systems are reviewed and negative.   Physical Exam: VS:  BP (!) 158/98 (BP Location: Right Arm)   Pulse (!) 102   Ht 5\' 9"  (1.753 m)   Wt 256  lb (116.1 kg)   SpO2 99%   BMI 37.80 kg/m , BMI Body mass index is 37.8 kg/m.  Wt Readings from Last 3 Encounters:  07/31/16 256 lb (116.1 kg)  06/19/16 258 lb 6.4 oz (117.2 kg)  04/30/16 259 lb 12.8 oz (117.8 kg)    General: Obese male, appears comfortable at rest. HEENT: Conjunctiva and lids normal, oropharynx clear. Neck: Supple, no elevated JVP or carotid bruits, no thyromegaly. Lungs: Clear to auscultation, nonlabored breathing at  rest. Cardiac: Regular rate and rhythm, no S3 or significant systolic murmur, no pericardial rub. Abdomen: Soft, nontender, bowel sounds present, no guarding or rebound. Extremities: No pitting edema, distal pulses 2+. Skin: Warm and dry. Musculoskeletal: No kyphosis. Neuropsychiatric: Alert and oriented x3, affect grossly appropriate.  ECG: I personally reviewed the tracing from 05/09/2015 which showed sinus rhythm with left atrial enlargement, LVH, and nonspecific ST changes.  Recent Labwork: 11/19/2015: ALT 22; AST 23; BUN 15; Hemoglobin 16.0; Platelets 249; Potassium 4.4; Sodium 135 05/27/2016: Creatinine, Ser 1.18   Other Studies Reviewed Today:  Echocardiogram 09/30/2007: SUMMARY - Overall left ventricular systolic function was normal. There were    no left ventricular regional wall motion abnormalities. Left    ventricular wall thickness was mildly increased.  Chest x-ray 02/14/2015: FINDINGS: Heart and mediastinal contours are within normal limits. No focal opacities or effusions. No acute bony abnormality.  IMPRESSION: No active cardiopulmonary disease.  Assessment and Plan:  1. Atypical chest pain, not reproducible with exertion. Baseline ECG is abnormal, suspect repolarization abnormalities in the setting of hypertension. Risk factors for ischemic heart disease include hypertension as well as gender and tobacco use. In general, colonoscopy with moderate sedation is low risk from a cardiac perspective, but in light of his chest pain symptoms would agree that screening ischemic testing is indicated (whether or not he has colonoscopy). Plan to obtain a Lexiscan Myoview on medical therapy for further evaluation.  2. Essential hypertension, blood pressure elevated today. He states that he ran out of atenolol and is due to see his PCP next week for refill. I recommended that he follow-up regularly for assessment of his blood pressure as he may need an additional agent.  Also discussed salt restriction and weight loss.  3. Tobacco abuse. Smoking cessation indicated.  4. GERD,, on anti-reflux medication.  Current medicines were reviewed with the patient today.   Orders Placed This Encounter  Procedures  . NM Myocar Multi W/Spect W/Wall Motion / EF  . EKG 12-Lead    Disposition: Call with test results.  Signed, Satira Sark, MD, [facility] 07/31/2016 8:48 AM    [facility] [address] [place], [place] [address] Phone: [phone]; Fax: [phone]

## 2016-07-31 ENCOUNTER — Ambulatory Visit (INDEPENDENT_AMBULATORY_CARE_PROVIDER_SITE_OTHER): Payer: BLUE CROSS/BLUE SHIELD | Admitting: Cardiology

## 2016-07-31 ENCOUNTER — Encounter: Payer: Self-pay | Admitting: Cardiology

## 2016-07-31 VITALS — BP 158/98 | HR 102 | Ht 69.0 in | Wt 256.0 lb

## 2016-07-31 DIAGNOSIS — Z720 Tobacco use: Secondary | ICD-10-CM

## 2016-07-31 DIAGNOSIS — K219 Gastro-esophageal reflux disease without esophagitis: Secondary | ICD-10-CM | POA: Diagnosis not present

## 2016-07-31 DIAGNOSIS — R0789 Other chest pain: Secondary | ICD-10-CM

## 2016-07-31 DIAGNOSIS — I10 Essential (primary) hypertension: Secondary | ICD-10-CM | POA: Diagnosis not present

## 2016-07-31 DIAGNOSIS — R9431 Abnormal electrocardiogram [ECG] [EKG]: Secondary | ICD-10-CM | POA: Diagnosis not present

## 2016-07-31 NOTE — Patient Instructions (Signed)
Your physician recommends that you schedule a follow-up appointment in: we will call you with test results   Your physician has requested that you have a lexiscan myoview. For further information please visit [email or web address]. Please follow instruction sheet, as given.      Thank you for choosing [facility] !

## 2016-08-08 ENCOUNTER — Encounter (HOSPITAL_COMMUNITY): Payer: Self-pay

## 2016-08-08 ENCOUNTER — Inpatient Hospital Stay (HOSPITAL_COMMUNITY): Admission: RE | Admit: 2016-08-08 | Payer: BLUE CROSS/BLUE SHIELD | Source: Ambulatory Visit

## 2016-08-08 ENCOUNTER — Encounter (HOSPITAL_COMMUNITY)
Admission: RE | Admit: 2016-08-08 | Discharge: 2016-08-08 | Disposition: A | Discharge status: Home / Self Care | Payer: BLUE CROSS/BLUE SHIELD | Source: Ambulatory Visit | Attending: Cardiology | Admitting: Cardiology

## 2016-08-08 DIAGNOSIS — R0789 Other chest pain: Secondary | ICD-10-CM | POA: Insufficient documentation

## 2016-08-08 DIAGNOSIS — R9431 Abnormal electrocardiogram [ECG] [EKG]: Secondary | ICD-10-CM | POA: Insufficient documentation

## 2016-08-08 LAB — NM MYOCAR MULTI W/SPECT W/WALL MOTION / EF
LV dias vol: 106 mL (ref 62–150)
LV sys vol: 41 mL
Peak HR: 105 {beats}/min
RATE: 0.28
Rest HR: 84 {beats}/min
SDS: 2
SRS: 0
SSS: 2
TID: 1.13

## 2016-08-08 MED ORDER — TECHNETIUM TC 99M TETROFOSMIN IV KIT
10.0000 | PACK | Freq: Once | INTRAVENOUS | Status: AC | PRN
  Administered 2016-08-08: 11 via INTRAVENOUS

## 2016-08-08 MED ORDER — REGADENOSON 0.4 MG/5ML IV SOLN
INTRAVENOUS | Status: AC
  Administered 2016-08-08: 0.4 mg via INTRAVENOUS
  Filled 2016-08-08: qty 5

## 2016-08-08 MED ORDER — SODIUM CHLORIDE 0.9% FLUSH
INTRAVENOUS | Status: AC
  Administered 2016-08-08: 10 mL via INTRAVENOUS
  Filled 2016-08-08: qty 10

## 2016-08-08 MED ORDER — TECHNETIUM TC 99M TETROFOSMIN IV KIT
30.0000 | PACK | Freq: Once | INTRAVENOUS | Status: AC | PRN
  Administered 2016-08-08: 32 via INTRAVENOUS

## 2016-08-11 ENCOUNTER — Other Ambulatory Visit: Payer: Self-pay

## 2016-08-11 DIAGNOSIS — K625 Hemorrhage of anus and rectum: Secondary | ICD-10-CM

## 2016-08-11 MED ORDER — PEG 3350-KCL-NA BICARB-NACL 420 G PO SOLR: 4000.0000 mL | ORAL | 0 refills | Status: DC

## 2016-08-13 ENCOUNTER — Telehealth: Payer: Self-pay | Admitting: Cardiology

## 2016-08-13 NOTE — Telephone Encounter (Signed)
Would like to know results of stress test

## 2016-08-13 NOTE — Progress Notes (Signed)
It apppears the patient was a no show for his stress test. Please call him and let him know this needs to be done to wrap up cardiology evaluation before we can do the colonoscopy

## 2016-08-13 NOTE — Telephone Encounter (Signed)
Name and DOB verified. Results given. Pt voiced understanding. Copy to PCP.

## 2016-08-13 NOTE — Progress Notes (Signed)
Called pt. He had stress test done 08/08/16. Informed EG. He has already been cleared to proceed with Colonoscopy and was already scheduled for 09/18/16.

## 2016-09-18 ENCOUNTER — Ambulatory Visit (HOSPITAL_COMMUNITY)
Admission: RE | Admit: 2016-09-18 | Discharge: 2016-09-18 | Disposition: A | Discharge status: Home / Self Care | Payer: BLUE CROSS/BLUE SHIELD | Source: Ambulatory Visit | Attending: Internal Medicine | Admitting: Internal Medicine

## 2016-09-18 ENCOUNTER — Encounter (HOSPITAL_COMMUNITY): Payer: Self-pay | Admitting: *Deleted

## 2016-09-18 ENCOUNTER — Encounter (HOSPITAL_COMMUNITY)
Admission: RE | Disposition: A | Discharge status: Home / Self Care | Payer: Self-pay | Source: Ambulatory Visit | Attending: Internal Medicine

## 2016-09-18 DIAGNOSIS — F17210 Nicotine dependence, cigarettes, uncomplicated: Secondary | ICD-10-CM | POA: Diagnosis not present

## 2016-09-18 DIAGNOSIS — K219 Gastro-esophageal reflux disease without esophagitis: Secondary | ICD-10-CM | POA: Insufficient documentation

## 2016-09-18 DIAGNOSIS — I10 Essential (primary) hypertension: Secondary | ICD-10-CM | POA: Insufficient documentation

## 2016-09-18 DIAGNOSIS — Z538 Procedure and treatment not carried out for other reasons: Secondary | ICD-10-CM

## 2016-09-18 DIAGNOSIS — Z800 Family history of malignant neoplasm of digestive organs: Secondary | ICD-10-CM | POA: Insufficient documentation

## 2016-09-18 DIAGNOSIS — Z1211 Encounter for screening for malignant neoplasm of colon: Secondary | ICD-10-CM | POA: Diagnosis not present

## 2016-09-18 DIAGNOSIS — K7689 Other specified diseases of liver: Secondary | ICD-10-CM | POA: Insufficient documentation

## 2016-09-18 DIAGNOSIS — K625 Hemorrhage of anus and rectum: Secondary | ICD-10-CM

## 2016-09-18 DIAGNOSIS — Z79899 Other long term (current) drug therapy: Secondary | ICD-10-CM | POA: Insufficient documentation

## 2016-09-18 DIAGNOSIS — Z86010 Personal history of colonic polyps: Secondary | ICD-10-CM | POA: Insufficient documentation

## 2016-09-18 HISTORY — PX: COLONOSCOPY: SHX5424

## 2016-09-18 SURGERY — COLONOSCOPY
Anesthesia: Moderate Sedation

## 2016-09-18 MED ORDER — SODIUM CHLORIDE 0.9 % IV SOLN
INTRAVENOUS | Status: DC
  Administered 2016-09-18: 07:00:00 via INTRAVENOUS

## 2016-09-18 MED ORDER — STERILE WATER FOR IRRIGATION IR SOLN
Status: DC | PRN
  Administered 2016-09-18: 08:00:00

## 2016-09-18 MED ORDER — MEPERIDINE HCL 100 MG/ML IJ SOLN
INTRAMUSCULAR | Status: DC
Start: 2016-09-18 — End: 2016-09-18
  Filled 2016-09-18: qty 2

## 2016-09-18 MED ORDER — ONDANSETRON HCL 4 MG/2ML IJ SOLN
INTRAMUSCULAR | Status: AC
  Filled 2016-09-18: qty 2

## 2016-09-18 MED ORDER — MEPERIDINE HCL 100 MG/ML IJ SOLN
INTRAMUSCULAR | Status: DC | PRN
  Administered 2016-09-18: 50 mg via INTRAVENOUS
  Administered 2016-09-18: 25 mg via INTRAVENOUS

## 2016-09-18 MED ORDER — MIDAZOLAM HCL 5 MG/5ML IJ SOLN
INTRAMUSCULAR | Status: AC
  Filled 2016-09-18: qty 10

## 2016-09-18 MED ORDER — MIDAZOLAM HCL 5 MG/5ML IJ SOLN
INTRAMUSCULAR | Status: DC | PRN
  Administered 2016-09-18: 1 mg via INTRAVENOUS
  Administered 2016-09-18 (×2): 2 mg via INTRAVENOUS

## 2016-09-18 MED ORDER — ONDANSETRON HCL 4 MG/2ML IJ SOLN
INTRAMUSCULAR | Status: DC | PRN
Start: 2016-09-18 — End: 2016-09-18
  Administered 2016-09-18: 4 mg via INTRAVENOUS

## 2016-09-18 NOTE — H&P (Signed)
@LOGO @   Primary Care Physician:  MUSE,ROCHELLE D., PA-C Primary Gastroenterologist:  Dr. Gala Romney  Pre-Procedure History & Physical: HPI:  [name] is a 46 y.o. male here for his colonoscopy. History of multiple polyps removed 2015. Stress test recently negative.  Past Medical History:  Diagnosis Date  . GERD (gastroesophageal reflux disease)   . Hepatic cyst    Benign by MRI  . Hiatal hernia   . History of colonic polyps   . Hypertension   . Rectal bleeding     Past Surgical History:  Procedure Laterality Date  . COLONOSCOPY  01/10/2009     RMR: Normal rectum, normal colon; repeat in 2015 due to [place] of colon cancer  . COLONOSCOPY N/A 01/09/2014   [id number] colonic polyps-removed as described  . ESOPHAGOGASTRODUODENOSCOPY  10/04/2007   RMR: Distal esophageal erosions consistent with erosive reflux esophagitis, patulous gastroesophageal junction status post passage of a  Maloney dilator, 46 [place].  Otherwise, unremarkable esophagus.  Hiatal hernia.  Otherwise normal stomach.  Bulbar erosion  . ESOPHAGOGASTRODUODENOSCOPY N/A 01/09/2014   Erosive reflux esophagitis. Small hiatal hernia    Prior to Admission medications   Medication Sig Start Date End Date Taking? Authorizing Provider  dexlansoprazole (DEXILANT) 60 MG capsule Take 1 capsule (60 mg total) by mouth daily. 04/01/16  Yes Annitta Needs, NP  Naphazoline-Glycerin (REDNESS RELIEF OP) Place 1 drop into both eyes daily as needed (REDNESS RELIEF).   Yes Historical Provider, MD  polyethylene glycol (MIRALAX / GLYCOLAX) packet Take 17 g by mouth daily. Patient taking differently: Take 17 g by mouth daily as needed for mild constipation.  11/19/15  Yes Charlesetta Shanks, MD  polyethylene glycol-electrolytes (TRILYTE) 420 g solution Take 4,000 mLs by mouth as directed. 08/11/16  Yes Daneil Dolin, MD  sodium chloride (OCEAN) 0.65 % SOLN nasal spray Place 1 spray into both nostrils daily as needed for congestion.   Yes Historical  Provider, MD    Allergies as of 08/11/2016  . (No Known Allergies)    Family History  Problem Relation Age of Onset  . Colon cancer Sister     passed away from colon cancer, in her 71s  . Diabetes Father   . Diabetes Other     Social History   Social History  . Marital status: Single    Spouse name: N/A  . Number of children: N/A  . Years of education: N/A   Occupational History  . [employer] in [place] History Main Topics  . Smoking status: Current Every Day Smoker    Packs/day: 0.25    Years: 10.00    Types: Cigarettes  . Smokeless tobacco: Never Used  . Alcohol use 0.0 oz/week     Comment: occasional/wine on the weekends  . Drug use: No  . Sexual activity: Not on file   Other Topics Concern  . Not on file   Social History Narrative  . No narrative on file    Review of Systems: See HPI, otherwise negative ROS  Physical Exam: BP 135/89   Pulse 73   Temp 98.8 F (37.1 C) (Oral)   Resp 10   Ht 5\' 9"  (1.753 m)   Wt 260 lb (117.9 kg)   SpO2 100%   BMI 38.40 kg/m  General:   Alert,  Well-developed, well-nourished, pleasant and cooperative in NAD  Neck:  Supple; no masses or thyromegaly. No significant cervical adenopathy. Lungs:  Clear throughout  to auscultation.   No wheezes, crackles, or rhonchi. No acute distress. Heart:  Regular rate and rhythm; no murmurs, clicks, rubs,  or gallops. Abdomen: Non-distended, normal bowel sounds.  Soft and nontender without appreciable mass or hepatosplenomegaly.  Pulses:  Normal pulses noted. Extremities:  Without clubbing or edema.  Impression:   Pleasant 46 year old gentleman with a positive family history of colon cancer and personal history of multiple colonic polyps removed. He is here for surveillance examination.   Recommendations: surveillance colonoscopy today.The risks, benefits, limitations, alternatives and imponderables have been reviewed with the  patient. Questions have been answered. All parties are agreeable.    Notice: This dictation was prepared with Dragon dictation along with smaller phrase technology. Any transcriptional errors that result from this process are unintentional and may not be corrected upon review.

## 2016-09-18 NOTE — Op Note (Signed)
[facility] Patient Name: [name] Procedure Date: 09/18/2016 7:34 AM MRN: [id number] Date of Birth: 20-Apr-1971 Attending MD: Norvel Richards , MD CSN: [id number] Age: 46 Admit Type: Outpatient Procedure:                Colonoscopy Indications:              High risk colon cancer surveillance: Personal                            history of colonic polyps; patient admits to eating                            chicken yesterday Providers:                Norvel Richards, MD, Jeanann Lewandowsky. Gwenlyn Perking RN, RN,                            Randa Spike, Technician Referring MD:              Medicines:                Midazolam 5 mg IV, Meperidine 75 mg IV, Ondansetron                            4 mg IV Complications:            No immediate complications. Estimated Blood Loss:     Estimated blood loss: none. Procedure:                Pre-Anesthesia Assessment:                           - Prior to the procedure, a History and Physical                            was performed, and patient medications and                            allergies were reviewed. The patient's tolerance of                            previous anesthesia was also reviewed. The risks                            and benefits of the procedure and the sedation                            options and risks were discussed with the patient.                            All questions were answered, and informed consent                            was obtained. Prior Anticoagulants: The patient has  taken no previous anticoagulant or antiplatelet                            agents. ASA Grade Assessment: II - A patient with                            mild systemic disease. After reviewing the risks                            and benefits, the patient was deemed in                            satisfactory condition to undergo the procedure.                           After obtaining informed consent,  the colonoscope                            was passed under direct vision. Throughout the                            procedure, the patient's blood pressure, pulse, and                            oxygen saturations were monitored continuously. The                            EC-[id number] ([id number]) scope was introduced through                            the anus and advanced to the the cecum, identified                            by appendiceal orifice and ileocecal valve. No                            anatomical landmarks were photographed. The quality                            of the bowel preparation was inadequate. Scope In: 7:49:30 AM Scope Out: 8:01:08 AM Scope Withdrawal Time: 0 hours 6 minutes 11 seconds  Total Procedure Duration: 0 hours 11 minutes 38 seconds  Findings:      The perianal and digital rectal examinations were normal. Colon       Inadequately prepped. Complete examinatid not be carried out. Impression:               - Preparation of the colon was inadequate secondary                            to noncompliance with prep instructions                           - Moderate Sedation:      Moderate (conscious) sedation was administered by the endoscopy  nurse       and supervised by the endoscopist. The following parameters were       monitored: oxygen saturation, heart rate, blood pressure, respiratory       rate, EKG, adequacy of pulmonary ventilation, and response to care.       Total physician intraservice time was 19 minutes. Recommendation:           - Patient has a contact number available for                            emergencies. The signs and symptoms of potential                            delayed complications were discussed with the                            patient. Return to normal activities tomorrow.                            Written discharge instructions were provided to the                            patient.                           - Resume  previous diet.                           - Continue present medications.                           - Repeat colonoscopy in 3 months for surveillance.                           - Return to GI clinic in 3 months. Procedure Code(s):        --- Professional ---                           [phone], Colonoscopy, flexible; diagnostic, including                            collection of specimen(s) by brushing or washing,                            when performed (separate procedure)                           99152, Moderate sedation services provided by the                            same physician or other qualified health care                            professional performing the diagnostic or  therapeutic service that the sedation supports,                            requiring the presence of an independent trained                            observer to assist in the monitoring of the                            patient's level of consciousness and physiological                            status; initial 15 minutes of intraservice time,                            patient age 70 years or older Diagnosis Code(s):        --- Professional ---                           Z86.010, Personal history of colonic polyps CPT copyright 2016 American [employer] Association. All rights reserved. The codes documented in this report are preliminary and upon coder review may  be revised to meet current compliance requirements. Cristopher Estimable. Fatoumata Albaugh, MD Norvel Richards, MD 09/18/2016 8:11:44 AM This report has been signed electronically. Number of Addenda: 0

## 2016-09-18 NOTE — Discharge Instructions (Addendum)
°  Colonoscopy Discharge Instructions  Read the instructions outlined below and refer to this sheet in the next few weeks. These discharge instructions provide you with general information on caring for yourself after you leave the hospital. Your doctor may also give you specific instructions. While your treatment has been planned according to the most current medical practices available, unavoidable complications occasionally occur. If you have any problems or questions after discharge, call Dr. Gala Romney at [phone]. ACTIVITY  You may resume your regular activity, but move at a slower pace for the next 24 hours.   Take frequent rest periods for the next 24 hours.   Walking will help get rid of the air and reduce the bloated feeling in your belly (abdomen).   No driving for 24 hours (because of the medicine (anesthesia) used during the test).    Do not sign any important legal documents or operate any machinery for 24 hours (because of the anesthesia used during the test).  NUTRITION  Drink plenty of fluids.   You may resume your normal diet as instructed by your doctor.   Begin with a light meal and progress to your normal diet. Heavy or fried foods are harder to digest and may make you feel sick to your stomach (nauseated).   Avoid alcoholic beverages for 24 hours or as instructed.  MEDICATIONS  You may resume your normal medications unless your doctor tells you otherwise.  WHAT YOU CAN EXPECT TODAY  Some feelings of bloating in the abdomen.   Passage of more gas than usual.   Spotting of blood in your stool or on the toilet paper.  IF YOU HAD POLYPS REMOVED DURING THE COLONOSCOPY:  No aspirin products for 7 days or as instructed.   No alcohol for 7 days or as instructed.   Eat a soft diet for the next 24 hours.  FINDING OUT THE RESULTS OF YOUR TEST Not all test results are available during your visit. If your test results are not back during the visit, make an appointment  with your caregiver to find out the results. Do not assume everything is normal if you have not heard from your caregiver or the medical facility. It is important for you to follow up on all of your test results.  SEEK IMMEDIATE MEDICAL ATTENTION IF:  You have more than a spotting of blood in your stool.   Your belly is swollen (abdominal distention).   You are nauseated or vomiting.   You have a temperature over 101.   You have abdominal pain or discomfort that is severe or gets worse throughout the day.     Your preparation was poor likely due to the fact that you ate solid food yesterday.  You should come return for a colonoscopy in the setting of a better preparation  Office visit with [place] in 3 months.

## 2016-09-24 ENCOUNTER — Encounter (HOSPITAL_COMMUNITY): Payer: Self-pay | Admitting: Internal Medicine

## 2016-10-08 ENCOUNTER — Ambulatory Visit: Payer: BLUE CROSS/BLUE SHIELD | Admitting: Nurse Practitioner

## 2016-10-30 ENCOUNTER — Ambulatory Visit: Payer: BLUE CROSS/BLUE SHIELD | Admitting: Nurse Practitioner

## 2016-12-24 ENCOUNTER — Ambulatory Visit: Payer: BLUE CROSS/BLUE SHIELD | Admitting: Nurse Practitioner

## 2017-01-20 ENCOUNTER — Ambulatory Visit: Payer: BLUE CROSS/BLUE SHIELD | Admitting: Nurse Practitioner

## 2017-01-20 NOTE — Progress Notes (Deleted)
Referring Provider: Raiford Simmonds., PA-C Primary Care Physician:  Raiford Simmonds., PA-C Primary GI:  Dr. Gala Romney  No chief complaint on file.   HPI:   [name] is a 46 y.o. male who presents to schedule a repeat colonoscopy due to poor prep at his last attempted procedure. The patient was last seen in our office 06/19/2016 for family history of colon cancer, constipation, rectal bleeding. He has a history of abnormal abdominal imaging with an indeterminate liver lesion which was followed upon with MRI. He did have an MRI on 05/28/2016 which noted the area of concern to be benign minimally complex small left liver lobe cyst without suspicious liver masses, mild diffuse hepatic steatosis. Also noted benign small Bosniac category 2 hemorrhagic/proteinaceous upper left renal cyst. The report was forwarded to his primary care to follow-up on the renal cyst. He was neck for a 12 month repeat MRI to ensure no changes in the liver lesion.  His last visit he did indicated he was having some chest pain and was awaiting cardiology appointment. He noted some constipation somewhat improved on MiraLAX, no more rectal bleeding, occasional right upper quadrant pain which is brief and intermittent, GERD well-controlled on PPI. Recommend a cardiology evaluation and then schedule colonoscopy. He saw cardiology and had a stress test completed and was cleared to proceed for colonoscopy.  Colonoscopy was completed 09/18/2016 which found inadequate prep due to patient's admitting eating chicken the day before procedure. Recommended repeat colonoscopy in 3 months.  Today he states   Past Medical History:  Diagnosis Date  . GERD (gastroesophageal reflux disease)   . Hepatic cyst    Benign by MRI  . Hiatal hernia   . History of colonic polyps   . Hypertension   . Rectal bleeding     Past Surgical History:  Procedure Laterality Date  . COLONOSCOPY  01/10/2009     RMR: Normal rectum, normal colon;  repeat in 2015 due to [place] of colon cancer  . COLONOSCOPY N/A 01/09/2014   SEG:[id number] colonic polyps-removed as described  . COLONOSCOPY N/A 09/18/2016   Procedure: COLONOSCOPY;  Surgeon: Daneil Dolin, MD;  Location: [facility];  Service: Endoscopy;  Laterality: N/A;  730   . ESOPHAGOGASTRODUODENOSCOPY  10/04/2007   RMR: Distal esophageal erosions consistent with erosive reflux esophagitis, patulous gastroesophageal junction status post passage of a  Maloney dilator, 37 [place].  Otherwise, unremarkable esophagus.  Hiatal hernia.  Otherwise normal stomach.  Bulbar erosion  . ESOPHAGOGASTRODUODENOSCOPY N/A 01/09/2014   Erosive reflux esophagitis. Small hiatal hernia    Current Outpatient Prescriptions  Medication Sig Dispense Refill  . dexlansoprazole (DEXILANT) 60 MG capsule Take 1 capsule (60 mg total) by mouth daily. 90 capsule 3  . Naphazoline-Glycerin (REDNESS RELIEF OP) Place 1 drop into both eyes daily as needed (REDNESS RELIEF).    . polyethylene glycol (MIRALAX / GLYCOLAX) packet Take 17 g by mouth daily. (Patient taking differently: Take 17 g by mouth daily as needed for mild constipation. ) 14 each 0  . polyethylene glycol-electrolytes (TRILYTE) 420 g solution Take 4,000 mLs by mouth as directed. 4000 mL 0  . sodium chloride (OCEAN) 0.65 % SOLN nasal spray Place 1 spray into both nostrils daily as needed for congestion.     No current facility-administered medications for this visit.     Allergies as of 01/20/2017  . (No Known Allergies)    Family History  Problem Relation Age of Onset  . Colon cancer  Sister        passed away from colon cancer, in her 33s  . Diabetes Father   . Diabetes Other     Social History   Social History  . Marital status: Single    Spouse name: N/A  . Number of children: N/A  . Years of education: N/A   Occupational History  . [employer] in [place] History Main Topics  . Smoking  status: Current Every Day Smoker    Packs/day: 0.25    Years: 10.00    Types: Cigarettes  . Smokeless tobacco: Never Used  . Alcohol use 0.0 oz/week     Comment: occasional/wine on the weekends  . Drug use: No  . Sexual activity: Not on file   Other Topics Concern  . Not on file   Social History Narrative  . No narrative on file    Review of Systems: General: Negative for anorexia, weight loss, fever, chills, fatigue, weakness. Eyes: Negative for vision changes.  ENT: Negative for hoarseness, difficulty swallowing , nasal congestion. CV: Negative for chest pain, angina, palpitations, dyspnea on exertion, peripheral edema.  Respiratory: Negative for dyspnea at rest, dyspnea on exertion, cough, sputum, wheezing.  GI: See history of present illness. GU:  Negative for dysuria, hematuria, urinary incontinence, urinary frequency, nocturnal urination.  MS: Negative for joint pain, low back pain.  Derm: Negative for rash or itching.  Neuro: Negative for weakness, abnormal sensation, seizure, frequent headaches, memory loss, confusion.  Psych: Negative for anxiety, depression, suicidal ideation, hallucinations.  Endo: Negative for unusual weight change.  Heme: Negative for bruising or bleeding. Allergy: Negative for rash or hives.   Physical Exam: There were no vitals taken for this visit. General:   Alert and oriented. Pleasant and cooperative. Well-nourished and well-developed.  Head:  Normocephalic and atraumatic. Eyes:  Without icterus, sclera clear and conjunctiva pink.  Ears:  Normal auditory acuity. Mouth:  No deformity or lesions, oral mucosa pink.  Throat/Neck:  Supple, without mass or thyromegaly. Cardiovascular:  S1, S2 present without murmurs appreciated. Normal pulses noted. Extremities without clubbing or edema. Respiratory:  Clear to auscultation bilaterally. No wheezes, rales, or rhonchi. No distress.  Gastrointestinal:  +BS, soft, non-tender and non-distended. No  HSM noted. No guarding or rebound. No masses appreciated.  Rectal:  Deferred  Musculoskalatal:  Symmetrical without gross deformities. Normal posture. Skin:  Intact without significant lesions or rashes. Neurologic:  Alert and oriented x4;  grossly normal neurologically. Psych:  Alert and cooperative. Normal mood and affect. Heme/Lymph/Immune: No significant cervical adenopathy. No excessive bruising noted.    01/20/2017 9:51 AM   Disclaimer: This note was dictated with voice recognition software. Similar sounding words can inadvertently be transcribed and may not be corrected upon review.

## 2017-02-11 ENCOUNTER — Telehealth: Payer: Self-pay

## 2017-02-11 ENCOUNTER — Ambulatory Visit: Payer: BLUE CROSS/BLUE SHIELD | Admitting: Nurse Practitioner

## 2017-02-11 MED ORDER — PEG 3350-KCL-NA BICARB-NACL 420 G PO SOLR: 4000.0000 mL | ORAL | 0 refills | Status: DC

## 2017-02-11 NOTE — Telephone Encounter (Signed)
Gastroenterology Pre-Procedure Review  Request Date: 02/11/2017 Requesting Physician:   PATIENT REVIEW QUESTIONS: The patient responded to the following health history questions as indicated:    1. Diabetes Melitis: no 2. Joint replacements in the past 12 months: no 3. Major health problems in the past 3 months: no 4. Has an artificial valve or MVP: no 5. Has a defibrillator: no 6. Has been advised in past to take antibiotics in advance of a procedure like teeth cleaning: no 7. Family history of colon cancer: yes  Sister diagnosed in her mid 36's 8. Alcohol Use: yes   Has a few glasses of wine and maybe a beer on the weekend 9. History of sleep apnea: no  10. History of coronary artery or other vascular stents placed within the last 12 months: no 11. History of any prior anesthesia complications: no    MEDICATIONS & ALLERGIES:    Patient reports the following regarding taking any blood thinners:   Plavix? no Aspirin? no Coumadin? no Brilinta? no Xarelto? no Eliquis? no Pradaxa? no Savaysa? no Effient? no  Patient confirms/reports the following medications:  Current Outpatient Prescriptions  Medication Sig Dispense Refill  . dexlansoprazole (DEXILANT) 60 MG capsule Take 1 capsule (60 mg total) by mouth daily. 90 capsule 3  . lisinopril (PRINIVIL,ZESTRIL) 10 MG tablet Take 10 mg by mouth daily.    . Naphazoline-Glycerin (REDNESS RELIEF OP) Place 1 drop into both eyes daily as needed (REDNESS RELIEF).    . polyethylene glycol (MIRALAX / GLYCOLAX) packet Take 17 g by mouth daily. (Patient taking differently: Take 17 g by mouth daily as needed for mild constipation. ) 14 each 0  . sodium chloride (OCEAN) 0.65 % SOLN nasal spray Place 1 spray into both nostrils daily as needed for congestion.    . polyethylene glycol-electrolytes (TRILYTE) 420 g solution Take 4,000 mLs by mouth as directed. 4000 mL 0  . polyethylene glycol-electrolytes (TRILYTE) 420 g solution Take 4,000 mLs by mouth  as directed. 4000 mL 0   No current facility-administered medications for this visit.     Patient confirms/reports the following allergies:  No Known Allergies  No orders of the defined types were placed in this encounter.   AUTHORIZATION INFORMATION Primary Insurance:   ID #:   Group #:  Pre-Cert / Auth required: Pre-Cert / Auth #:   Secondary Insurance:   ID #:  Group #:  Pre-Cert / Auth required:  Pre-Cert / Auth #:   SCHEDULE INFORMATION: Procedure has been scheduled as follows:  Date:  03/05/2017            Time:  1:30 pm Location: [facility] Short Stay  This Gastroenterology Pre-Precedure Review Form is being routed to the following provider(s): R. Garfield Cornea, MD

## 2017-02-11 NOTE — Telephone Encounter (Signed)
Ok to schedule with 25mg  preprocedure Phenergan for moderate ETOH use

## 2017-02-12 ENCOUNTER — Other Ambulatory Visit: Payer: Self-pay

## 2017-02-12 DIAGNOSIS — Z800 Family history of malignant neoplasm of digestive organs: Secondary | ICD-10-CM

## 2017-02-12 NOTE — Telephone Encounter (Signed)
Orders entered. Pt was given instructions when he came in and was scheduled.  I went over everything step by step with him. Rx sent to the pharmacy.

## 2017-03-03 ENCOUNTER — Emergency Department (HOSPITAL_COMMUNITY)
Admission: EM | Admit: 2017-03-03 | Discharge: 2017-03-04 | Disposition: A | Discharge status: Home / Self Care | Payer: BLUE CROSS/BLUE SHIELD | Attending: Emergency Medicine | Admitting: Emergency Medicine

## 2017-03-03 ENCOUNTER — Emergency Department (HOSPITAL_COMMUNITY): Payer: BLUE CROSS/BLUE SHIELD

## 2017-03-03 ENCOUNTER — Encounter (HOSPITAL_COMMUNITY): Payer: Self-pay | Admitting: Emergency Medicine

## 2017-03-03 DIAGNOSIS — R079 Chest pain, unspecified: Secondary | ICD-10-CM | POA: Diagnosis not present

## 2017-03-03 DIAGNOSIS — F17210 Nicotine dependence, cigarettes, uncomplicated: Secondary | ICD-10-CM | POA: Insufficient documentation

## 2017-03-03 DIAGNOSIS — I10 Essential (primary) hypertension: Secondary | ICD-10-CM | POA: Diagnosis not present

## 2017-03-03 DIAGNOSIS — R0789 Other chest pain: Secondary | ICD-10-CM | POA: Diagnosis not present

## 2017-03-03 LAB — BASIC METABOLIC PANEL
Anion gap: 8 (ref 5–15)
BUN: 19 mg/dL (ref 6–20)
CO2: 24 mmol/L (ref 22–32)
Calcium: 8.9 mg/dL (ref 8.9–10.3)
Chloride: 103 mmol/L (ref 101–111)
Creatinine, Ser: 1.29 mg/dL — ABNORMAL HIGH (ref 0.61–1.24)
GFR calc Af Amer: >60 mL/min (ref >60)
GFR calc non Af Amer: >60 mL/min (ref >60)
Glucose, Bld: 109 mg/dL — ABNORMAL HIGH (ref 65–99)
Potassium: 4.1 mmol/L (ref 3.5–5.1)
Sodium: 135 mmol/L (ref 135–145)

## 2017-03-03 LAB — CBC
HCT: 42.9 % (ref 39.0–52.0)
Hemoglobin: 13.8 g/dL (ref 13.0–17.0)
MCH: 27.8 pg (ref 26.0–34.0)
MCHC: 32.2 g/dL (ref 30.0–36.0)
MCV: 86.5 fL (ref 78.0–100.0)
Platelets: 228 10*3/uL (ref 150–400)
RBC: 4.96 MIL/uL (ref 4.22–5.81)
RDW: 13 % (ref 11.5–15.5)
WBC: 9.6 10*3/uL (ref 4.0–10.5)

## 2017-03-03 LAB — I-STAT TROPONIN, ED: Troponin i, poc: 0 ng/mL (ref 0.00–0.08)

## 2017-03-03 NOTE — ED Notes (Signed)
Pt states he stopped taking his BP meds 3-4 months ago.  Pt states he felt healthier and was working out and did not need to take meds anymore.

## 2017-03-03 NOTE — ED Triage Notes (Addendum)
Per EMS: pt from home with c/o left sided chest pressure that radiates to left shoulder that started around 6:00 pm this evening.  Pt states he went to lie down, thought it was indigestion.  At that time pt states "my heart began thumping".  EMS administered 324 aspirin, and 1 Nitro. Pt felt relief with the nitro.   Pt denies any SOB, diaphoresis, N/V/D.

## 2017-03-03 NOTE — ED Notes (Signed)
Pt states he stopped taking his BP meds 3/4 months ago.  He stated he felt llike he was

## 2017-03-04 ENCOUNTER — Telehealth: Payer: Self-pay | Admitting: Internal Medicine

## 2017-03-04 LAB — I-STAT TROPONIN, ED: Troponin i, poc: 0 ng/mL (ref 0.00–0.08)

## 2017-03-04 MED ORDER — KETOROLAC TROMETHAMINE 30 MG/ML IJ SOLN
30.0000 mg | Freq: Once | INTRAMUSCULAR | Status: AC
  Administered 2017-03-04: 30 mg via INTRAVENOUS
  Filled 2017-03-04: qty 1

## 2017-03-04 NOTE — ED Provider Notes (Signed)
[facility] DEPT Provider Note   CSN: [id number] Arrival date & time: 03/03/17  2145     History   Chief Complaint Chief Complaint  Patient presents with  . Chest Pain    HPI [name] is a 46 y.o. male.  The history is provided by the patient and medical records. No language interpreter was used.  Chest Pain     [name] is a 46 y.o. male  with a PMH of GERD, HTN who presents to the Emergency Department complaining of Left upper chest pain which began today. Patient states initially symptoms began at 6 PM. Pain described as a squeezing sensation to the left upper chest. This was constant for about an hour then resolved. He was sitting down when pain occurred. No medications were taken and symptoms self resolved. About 8:30, he laid down in bed and was trying to get to sleep, he again experienced left-sided squeezing type pain in felt as if his "heart was thumping". Patient called EMS and 324 aspirin along with one nitroglycerin given. Patient states that he did have a little relief with nitroglycerin but pain has begun again. No associated shortness of breath, diaphoresis, abdominal pain, nausea, vomiting, back pain. No other complaints other than chest pain. No history of diabetes, heart disease or hyperlipidemia. No family cardiac history. + daily smoker. No recent travel or immobilizations. He should have a colonoscopy back in April, but no other surgeries/procedures recently. No history of DVT/PE or clotting factor diseases.  Past Medical History:  Diagnosis Date  . GERD (gastroesophageal reflux disease)   . Hepatic cyst    Benign by MRI  . Hiatal hernia   . History of colonic polyps   . Hypertension   . Rectal bleeding     Patient Active Problem List   Diagnosis Date Noted  . Family history of colon cancer 05/01/2016  . Chest pain 05/01/2016  . Abnormal ultrasound 07/19/2015  . Erosive esophagitis 04/11/2014  . Hx of adenomatous colonic polyps 04/11/2014   . Abdominal pain 06/01/2012  . Rectal bleeding 06/01/2012  . Constipation 06/01/2012  . ALCOHOL ABUSE 09/06/2008  . TOBACCO ABUSE 09/06/2008  . HYPERTENSION 09/06/2008  . GASTROESOPHAGEAL REFLUX DISEASE, CHRONIC 09/06/2008  . CONSTIPATION, CHRONIC 09/06/2008  . DYSPHAGIA UNSPECIFIED 09/06/2008    Past Surgical History:  Procedure Laterality Date  . COLONOSCOPY  01/10/2009     RMR: Normal rectum, normal colon; repeat in 2015 due to [place] of colon cancer  . COLONOSCOPY N/A 01/09/2014   [id number] colonic polyps-removed as described  . COLONOSCOPY N/A 09/18/2016   Procedure: COLONOSCOPY;  Surgeon: Daneil Dolin, MD;  Location: [facility];  Service: Endoscopy;  Laterality: N/A;  730   . ESOPHAGOGASTRODUODENOSCOPY  10/04/2007   RMR: Distal esophageal erosions consistent with erosive reflux esophagitis, patulous gastroesophageal junction status post passage of a  Maloney dilator, 7 [place].  Otherwise, unremarkable esophagus.  Hiatal hernia.  Otherwise normal stomach.  Bulbar erosion  . ESOPHAGOGASTRODUODENOSCOPY N/A 01/09/2014   Erosive reflux esophagitis. Small hiatal hernia       Home Medications    Prior to Admission medications   Medication Sig Start Date End Date Taking? Authorizing Provider  dexlansoprazole (DEXILANT) 60 MG capsule Take 1 capsule (60 mg total) by mouth daily. Patient not taking: Reported on 03/03/2017 04/01/16   Annitta Needs, NP  polyethylene glycol [facility] / Floria Raveling) packet Take 17 g by mouth daily. Patient not taking: Reported on 03/03/2017 11/19/15   Charlesetta Shanks,  MD  polyethylene glycol-electrolytes (TRILYTE) 420 g solution Take 4,000 mLs by mouth as directed. Patient not taking: Reported on 03/03/2017 08/11/16   Daneil Dolin, MD  polyethylene glycol-electrolytes (TRILYTE) 420 g solution Take 4,000 mLs by mouth as directed. Patient not taking: Reported on 03/03/2017 02/11/17   Rourk, Cristopher Estimable, MD    Family History Family History  Problem Relation  Age of Onset  . Colon cancer Sister        passed away from colon cancer, in her 26s  . Diabetes Father   . Diabetes Other     Social History Social History  Substance Use Topics  . Smoking status: Current Every Day Smoker    Packs/day: 0.25    Years: 10.00    Types: Cigarettes  . Smokeless tobacco: Never Used  . Alcohol use 0.0 oz/week     Comment: occasional/wine on the weekends     Allergies   Patient has no known allergies.   Review of Systems Review of Systems  Cardiovascular: Positive for chest pain.  All other systems reviewed and are negative.    Physical Exam Updated Vital Signs BP 128/75   Pulse 64   Temp 98.7 F (37.1 C) (Oral)   Resp 18   SpO2 98%   Physical Exam  Constitutional: He is oriented to person, place, and time. He appears well-developed and well-nourished. No distress.  HENT:  Head: Normocephalic and atraumatic.  Cardiovascular: Normal rate, regular rhythm and normal heart sounds.   No murmur heard. Pulmonary/Chest: Effort normal and breath sounds normal. No respiratory distress.    Abdominal: Soft. He exhibits no distension. There is no tenderness.  Musculoskeletal: He exhibits no edema.  Neurological: He is alert and oriented to person, place, and time.  Skin: Skin is warm and dry.  Nursing note and vitals reviewed.    ED Treatments / Results  Labs (all labs ordered are listed, but only abnormal results are displayed) Labs Reviewed  BASIC METABOLIC PANEL - Abnormal; Notable for the following:       Result Value   Glucose, Bld 109 (*)    Creatinine, Ser 1.29 (*)    All other components within normal limits  CBC  I-STAT TROPONIN, ED  I-STAT TROPONIN, ED    EKG  EKG Interpretation  Date/Time:  Tuesday March 03 2017 21:45:53 EDT Ventricular Rate:  75 PR Interval:    QRS Duration: 93 QT Interval:  368 QTC Calculation: 411 R Axis:   33 Text Interpretation:  Sinus rhythm Consider left ventricular hypertrophy ST elev,  probable normal early repol pattern No significant change since last tracing Confirmed by Wandra Arthurs [id number] on 03/03/2017 11:03:37 PM       Radiology Dg Chest 2 View  Result Date: 03/03/2017 CLINICAL DATA:  Acute onset of left-sided chest pressure, radiating to the left shoulder. Initial encounter. EXAM: CHEST  2 VIEW COMPARISON:  Chest radiograph performed 02/14/2015 FINDINGS: The lungs are well-aerated and clear. There is no evidence of focal opacification, pleural effusion or pneumothorax. The heart is normal in size; the mediastinal contour is within normal limits. No acute osseous abnormalities are seen. IMPRESSION: No acute cardiopulmonary process seen. Electronically Signed   By: Garald Balding M.D.   On: 03/03/2017 23:17    Procedures Procedures (including critical care time)  Medications Ordered in ED Medications  ketorolac (TORADOL) 30 MG/ML injection 30 mg (30 mg Intravenous Given 03/04/17 0030)     Initial Impression / Assessment and Plan / ED  Course  I have reviewed the triage vital signs and the nursing notes.  Pertinent labs & imaging results that were available during my care of the patient were reviewed by me and considered in my medical decision making (see chart for details).    [name] is a 46 y.o. male who presents to ED for chest pain which began while lying in bed trying to sleep. No associated symptoms. On exam, patient is afebrile, hemodynamically stable with reproducible chest tenderness, otherwise normal cardiopulmonary exam.   Labs reviewed and reassuring with negative troponin x2.  CXR with no acute abnormalities.  EKG unchanged from previous.  Heart score of 3. PERC negative  Seen by cardiology for atypical chest pain in March 2018, Lexiscan Myoview obtained andpatient cleared from cardiac standpoint for outpatient colonoscopy.   Patient's symptoms unlikely to be of cardiac etiology. Labs and imaging reviewed again prior to discharge.  Patient has been advised to return to the ED if development of any exertional chest pain, trouble breathing, new/worsening symptoms or for any additional concerns. Evaluation does not show pathology that would require ongoing emergent intervention or inpatient treatment, however would like patient to follow up with cardiology as an outpatient this week or early next week. Referral information included in discharge paperwork. Patient understands return precautions and follow up plan. All questions answered.  Patient discussed with Dr. Darl Householder who agrees with treatment plan.   Final Clinical Impressions(s) / ED Diagnoses   Final diagnoses:  Chest pain with low risk for cardiac etiology    New Prescriptions New Prescriptions   No medications on file     Ward, Ozella Almond, PA-C 03/04/17 0123    Drenda Freeze, MD 03/04/17 [phone]

## 2017-03-04 NOTE — Telephone Encounter (Signed)
Pt called to cancel his procedure for tomorrow, because he went to the ER last night and couldn't start his prep in time. He wants to reschedule. Please call [phone]

## 2017-03-04 NOTE — Telephone Encounter (Signed)
[name], this pt has rescheduled before. Should I reschedule or does he need OV?

## 2017-03-04 NOTE — Discharge Instructions (Signed)
It was my pleasure taking care of you today!   You were seen in the Emergency Department today for chest pain.  As we have discussed, today?s blood work and imaging are normal, but you may require further testing.  Please call the cardiology clinic listed in the morning to schedule a follow up appointment for further evaluation of the chest pain you experienced today.   Return to the Emergency Department if you experience any further chest pain/pressure/tightness, difficulty breathing, sudden sweating, or other symptoms that concern you.

## 2017-03-04 NOTE — Telephone Encounter (Signed)
Patient will need an ov to follow-up on his symptoms to see if colonoscopy is still warranted.  Routing to [place] to schedule his procedure.

## 2017-03-05 ENCOUNTER — Encounter (HOSPITAL_COMMUNITY): Admission: RE | Payer: Self-pay | Source: Ambulatory Visit

## 2017-03-05 ENCOUNTER — Encounter: Payer: Self-pay | Admitting: Gastroenterology

## 2017-03-05 ENCOUNTER — Ambulatory Visit (HOSPITAL_COMMUNITY)
Admission: RE | Admit: 2017-03-05 | Payer: BLUE CROSS/BLUE SHIELD | Source: Ambulatory Visit | Admitting: Internal Medicine

## 2017-03-05 SURGERY — COLONOSCOPY
Anesthesia: Moderate Sedation

## 2017-03-05 NOTE — Telephone Encounter (Signed)
PATIENT SCHEDULED  °

## 2017-03-09 NOTE — Telephone Encounter (Signed)
Noted  

## 2017-04-22 ENCOUNTER — Encounter: Payer: Self-pay | Admitting: Internal Medicine

## 2017-04-22 ENCOUNTER — Ambulatory Visit: Payer: BLUE CROSS/BLUE SHIELD | Admitting: Nurse Practitioner

## 2017-04-22 ENCOUNTER — Telehealth: Payer: Self-pay | Admitting: Nurse Practitioner

## 2017-04-22 NOTE — Progress Notes (Deleted)
Referring Provider: Raiford Simmonds., PA-C Primary Care Physician:  Raiford Simmonds., PA-C Primary GI:  Dr. Gala Romney  No chief complaint on file.   HPI:   [name] is a 46 y.o. male who presents evaluate symptoms to see if colonoscopy is necessary.  Patient was previously scheduled for colonoscopy based on phone triage but had to cancel due to being in the emergency room and unable to begin prep as recommended.  A previous colonoscopy is completed 09/18/2016 which found an adequate prep.  Recommended repeat colonoscopy in 3 months for surveillance due to high risk with personal history of colon polyps and family history of colon cancer in a primary relative diagnosed around age 27.  He was last seen in our office 06/19/2016 for family history of colon cancer, constipation, rectal bleeding, chest pain.  That time he was doing well.  MRI completed 05/28/2016 for suspicious area of liver on previous abdominal imaging and is found to be benign minimally complex small left liver lobe cyst without suspicious liver masses.  Also noted mild diffuse hepatic steatosis.  Renal cyst noted as well and the report was forwarded to the primary care for follow-up.  At the time of his last visit he was still having some constipation on MiraLAX but is quite a bit improved.  Also increase dietary fiber.  No further rectal bleeding, occasional right upper quadrant pain which is brief.  GERD well controlled.  No other GI symptoms.  No further chest pain.  Was evaluated by cardiology for his chest pain.  Stress echo found EF of 61% and deemed low risk study with no ischemic territories.  Cleared for colonoscopy.  Today he states   Past Medical History:  Diagnosis Date  . GERD (gastroesophageal reflux disease)   . Hepatic cyst    Benign by MRI  . Hiatal hernia   . History of colonic polyps   . Hypertension   . Rectal bleeding     Past Surgical History:  Procedure Laterality Date  . COLONOSCOPY  01/10/2009      RMR: Normal rectum, normal colon; repeat in 2015 due to [place] of colon cancer  . COLONOSCOPY N/A 01/09/2014   [id number] colonic polyps-removed as described  . COLONOSCOPY N/A 09/18/2016   Procedure: COLONOSCOPY;  Surgeon: Daneil Dolin, MD;  Location: [facility];  Service: Endoscopy;  Laterality: N/A;  730   . ESOPHAGOGASTRODUODENOSCOPY  10/04/2007   RMR: Distal esophageal erosions consistent with erosive reflux esophagitis, patulous gastroesophageal junction status post passage of a  Maloney dilator, 92 [place].  Otherwise, unremarkable esophagus.  Hiatal hernia.  Otherwise normal stomach.  Bulbar erosion  . ESOPHAGOGASTRODUODENOSCOPY N/A 01/09/2014   Erosive reflux esophagitis. Small hiatal hernia    Current Outpatient Medications  Medication Sig Dispense Refill  . dexlansoprazole (DEXILANT) 60 MG capsule Take 1 capsule (60 mg total) by mouth daily. (Patient not taking: Reported on 03/03/2017) 90 capsule 3  . polyethylene glycol (MIRALAX / GLYCOLAX) packet Take 17 g by mouth daily. (Patient not taking: Reported on 03/03/2017) 14 each 0  . polyethylene glycol-electrolytes (TRILYTE) 420 g solution Take 4,000 mLs by mouth as directed. (Patient not taking: Reported on 03/03/2017) 4000 mL 0  . polyethylene glycol-electrolytes (TRILYTE) 420 g solution Take 4,000 mLs by mouth as directed. (Patient not taking: Reported on 03/03/2017) 4000 mL 0   No current facility-administered medications for this visit.     Allergies as of 04/22/2017  . (No Known Allergies)  Family History  Problem Relation Age of Onset  . Colon cancer Sister        passed away from colon cancer, in her 37s  . Diabetes Father   . Diabetes Other     Social History   Socioeconomic History  . Marital status: Single    Spouse name: Not on file  . Number of children: Not on file  . Years of education: Not on file  . Highest education level: Not on file  Social Needs  . Financial resource strain: Not on file  .  Food insecurity - worry: Not on file  . Food insecurity - inability: Not on file  . Transportation needs - medical: Not on file  . Transportation needs - non-medical: Not on file  Occupational History  . Occupation: Scientist, physiological: [employer]    Comment: IT sales professional in [employer]  . Smoking status: Current Every Day Smoker    Packs/day: 0.25    Years: 10.00    Pack years: 2.50    Types: Cigarettes  . Smokeless tobacco: Never Used  Substance and Sexual Activity  . Alcohol use: Yes    Alcohol/week: 0.0 oz    Comment: occasional/wine on the weekends  . Drug use: No  . Sexual activity: Not on file  Other Topics Concern  . Not on file  Social History Narrative  . Not on file    Review of Systems: General: Negative for anorexia, weight loss, fever, chills, fatigue, weakness. Eyes: Negative for vision changes.  ENT: Negative for hoarseness, difficulty swallowing , nasal congestion. CV: Negative for chest pain, angina, palpitations, dyspnea on exertion, peripheral edema.  Respiratory: Negative for dyspnea at rest, dyspnea on exertion, cough, sputum, wheezing.  GI: See history of present illness. GU:  Negative for dysuria, hematuria, urinary incontinence, urinary frequency, nocturnal urination.  MS: Negative for joint pain, low back pain.  Derm: Negative for rash or itching.  Neuro: Negative for weakness, abnormal sensation, seizure, frequent headaches, memory loss, confusion.  Psych: Negative for anxiety, depression, suicidal ideation, hallucinations.  Endo: Negative for unusual weight change.  Heme: Negative for bruising or bleeding. Allergy: Negative for rash or hives.   Physical Exam: There were no vitals taken for this visit. General:   Alert and oriented. Pleasant and cooperative. Well-nourished and well-developed.  Head:  Normocephalic and atraumatic. Eyes:  Without icterus, sclera clear and conjunctiva pink.  Ears:  Normal auditory  acuity. Mouth:  No deformity or lesions, oral mucosa pink.  Throat/Neck:  Supple, without mass or thyromegaly. Cardiovascular:  S1, S2 present without murmurs appreciated. Normal pulses noted. Extremities without clubbing or edema. Respiratory:  Clear to auscultation bilaterally. No wheezes, rales, or rhonchi. No distress.  Gastrointestinal:  +BS, soft, non-tender and non-distended. No HSM noted. No guarding or rebound. No masses appreciated.  Rectal:  Deferred  Musculoskalatal:  Symmetrical without gross deformities. Normal posture. Skin:  Intact without significant lesions or rashes. Neurologic:  Alert and oriented x4;  grossly normal neurologically. Psych:  Alert and cooperative. Normal mood and affect. Heme/Lymph/Immune: No significant cervical adenopathy. No excessive bruising noted.    04/22/2017 1:15 PM   Disclaimer: This note was dictated with voice recognition software. Similar sounding words can inadvertently be transcribed and may not be corrected upon review.

## 2017-04-22 NOTE — Telephone Encounter (Signed)
Patient was a no show and letter sent  °

## 2017-04-23 NOTE — Telephone Encounter (Signed)
Noted  

## 2017-05-07 ENCOUNTER — Encounter: Payer: Self-pay | Admitting: *Deleted

## 2017-05-07 ENCOUNTER — Telehealth: Payer: Self-pay | Admitting: Internal Medicine

## 2017-05-07 NOTE — Telephone Encounter (Signed)
Recall for mri abdomen 

## 2017-05-07 NOTE — Telephone Encounter (Signed)
Letter mailed to pt.  

## 2017-06-16 ENCOUNTER — Other Ambulatory Visit: Payer: Self-pay | Admitting: Gastroenterology

## 2017-06-16 DIAGNOSIS — K2100 Gastro-esophageal reflux disease with esophagitis, without bleeding: Secondary | ICD-10-CM

## 2017-06-16 DIAGNOSIS — R9389 Abnormal findings on diagnostic imaging of other specified body structures: Secondary | ICD-10-CM

## 2017-07-10 ENCOUNTER — Encounter (HOSPITAL_COMMUNITY): Payer: Self-pay | Admitting: Emergency Medicine

## 2017-07-10 ENCOUNTER — Emergency Department (HOSPITAL_COMMUNITY)
Admission: EM | Admit: 2017-07-10 | Discharge: 2017-07-10 | Disposition: A | Discharge status: Home / Self Care | Payer: BLUE CROSS/BLUE SHIELD | Attending: Emergency Medicine | Admitting: Emergency Medicine

## 2017-07-10 ENCOUNTER — Other Ambulatory Visit: Payer: Self-pay

## 2017-07-10 DIAGNOSIS — J111 Influenza due to unidentified influenza virus with other respiratory manifestations: Secondary | ICD-10-CM | POA: Diagnosis not present

## 2017-07-10 DIAGNOSIS — Z79899 Other long term (current) drug therapy: Secondary | ICD-10-CM | POA: Diagnosis not present

## 2017-07-10 DIAGNOSIS — R05 Cough: Secondary | ICD-10-CM | POA: Diagnosis not present

## 2017-07-10 DIAGNOSIS — I10 Essential (primary) hypertension: Secondary | ICD-10-CM | POA: Diagnosis not present

## 2017-07-10 DIAGNOSIS — R69 Illness, unspecified: Secondary | ICD-10-CM

## 2017-07-10 DIAGNOSIS — F17210 Nicotine dependence, cigarettes, uncomplicated: Secondary | ICD-10-CM | POA: Diagnosis not present

## 2017-07-10 MED ORDER — OSELTAMIVIR PHOSPHATE 75 MG PO CAPS: 75.0000 mg | ORAL_CAPSULE | Freq: Two times a day (BID) | ORAL | 0 refills | Status: DC

## 2017-07-10 NOTE — ED Notes (Signed)
PT states understanding of care given, follow up care, and medication prescribed. PT is ambulated from ED to car with a steady gait.  

## 2017-07-10 NOTE — ED Triage Notes (Signed)
Pt c/o nasal congestion, headache, sore throat, chills since yesterday.

## 2017-07-10 NOTE — ED Provider Notes (Signed)
[facility] EMERGENCY DEPARTMENT Provider Note   CSN: [id number] Arrival date & time: 07/10/17  1411     History   Chief Complaint Chief Complaint  Patient presents with  . Nasal Congestion  . Sore Throat  . Generalized Body Aches    HPI [name] is a 47 y.o. male.  The history is provided by the patient. No language interpreter was used.  Sore Throat  This is a new problem. The current episode started 2 days ago. The problem occurs constantly. The problem has been gradually worsening. Pertinent negatives include no headaches. Nothing aggravates the symptoms. Nothing relieves the symptoms. He has tried nothing for the symptoms. The treatment provided no relief.  Pt exposed to influenza at work.  Pt requesting medication for.  Pt reports he is coughing and has bodyaches  Past Medical History:  Diagnosis Date  . GERD (gastroesophageal reflux disease)   . Hepatic cyst    Benign by MRI  . Hiatal hernia   . History of colonic polyps   . Hypertension   . Rectal bleeding     Patient Active Problem List   Diagnosis Date Noted  . Family history of colon cancer 05/01/2016  . Chest pain 05/01/2016  . Abnormal ultrasound 07/19/2015  . Erosive esophagitis 04/11/2014  . Hx of adenomatous colonic polyps 04/11/2014  . Abdominal pain 06/01/2012  . Rectal bleeding 06/01/2012  . Constipation 06/01/2012  . ALCOHOL ABUSE 09/06/2008  . TOBACCO ABUSE 09/06/2008  . HYPERTENSION 09/06/2008  . GASTROESOPHAGEAL REFLUX DISEASE, CHRONIC 09/06/2008  . CONSTIPATION, CHRONIC 09/06/2008  . DYSPHAGIA UNSPECIFIED 09/06/2008    Past Surgical History:  Procedure Laterality Date  . COLONOSCOPY  01/10/2009     RMR: Normal rectum, normal colon; repeat in 2015 due to [place] of colon cancer  . COLONOSCOPY N/A 01/09/2014   [id number] colonic polyps-removed as described  . COLONOSCOPY N/A 09/18/2016   Procedure: COLONOSCOPY;  Surgeon: Daneil Dolin, MD;  [facility];  Service: Endoscopy;  Laterality: N/A;  730   . ESOPHAGOGASTRODUODENOSCOPY  10/04/2007   RMR: Distal esophageal erosions consistent with erosive reflux esophagitis, patulous gastroesophageal junction status post passage of a  Maloney dilator, 33 [place].  Otherwise, unremarkable esophagus.  Hiatal hernia.  Otherwise normal stomach.  Bulbar erosion  . ESOPHAGOGASTRODUODENOSCOPY N/A 01/09/2014   Erosive reflux esophagitis. Small hiatal hernia       Home Medications    Prior to Admission medications   Medication Sig Start Date End Date Taking? Authorizing Provider  DEXILANT 60 MG capsule TAKE 1 CAPSULE (60 MG TOTAL) BY MOUTH DAILY. 06/16/17   Annitta Needs, NP  oseltamivir (TAMIFLU) 75 MG capsule Take 1 capsule (75 mg total) by mouth every 12 (twelve) hours. 07/10/17   Fransico Meadow, PA-C  polyethylene glycol (MIRALAX / GLYCOLAX) packet Take 17 g by mouth daily. Patient not taking: Reported on 03/03/2017 11/19/15   Charlesetta Shanks, MD  polyethylene glycol-electrolytes (TRILYTE) 420 g solution Take 4,000 mLs by mouth as directed. Patient not taking: Reported on 03/03/2017 08/11/16   Daneil Dolin, MD  polyethylene glycol-electrolytes (TRILYTE) 420 g solution Take 4,000 mLs by mouth as directed. Patient not taking: Reported on 03/03/2017 02/11/17   Rourk, Cristopher Estimable, MD    Family History Family History  Problem Relation Age of Onset  . Colon cancer Sister        passed away from colon cancer, in her 5s  . Diabetes Father   . Diabetes  Other     Social History Social History   Tobacco Use  . Smoking status: Current Every Day Smoker    Packs/day: 0.25    Years: 10.00    Pack years: 2.50    Types: Cigarettes  . Smokeless tobacco: Never Used  Substance Use Topics  . Alcohol use: Yes    Alcohol/week: 0.0 oz    Comment: occasional/wine on the weekends  . Drug use: No     Allergies   Patient has no known allergies.   Review of Systems Review of Systems  Neurological:  Negative for headaches.  All other systems reviewed and are negative.    Physical Exam Updated Vital Signs BP (!) 149/100 (BP Location: Right Arm)   Pulse 83   Temp 98.2 F (36.8 C) (Oral)   Resp 18   Ht 5\' 9"  (1.753 m)   Wt 104.3 kg (230 lb)   SpO2 99%   BMI 33.97 kg/m   Physical Exam  Constitutional: He appears well-developed and well-nourished.  HENT:  Head: Normocephalic and atraumatic.  Right Ear: Tympanic membrane normal.  Left Ear: Tympanic membrane normal.  Mouth/Throat: Oropharynx is clear and moist and mucous membranes are normal.  Eyes: Conjunctivae are normal. Pupils are equal, round, and reactive to light.  Neck: Neck supple.  Cardiovascular: Normal rate and regular rhythm.  No murmur heard. Pulmonary/Chest: Effort normal and breath sounds normal. No respiratory distress.  Abdominal: Soft. There is no tenderness.  Musculoskeletal: He exhibits no edema.  Neurological: He is alert.  Skin: Skin is warm and dry.  Psychiatric: He has a normal mood and affect.  Nursing note and vitals reviewed.    ED Treatments / Results  Labs (all labs ordered are listed, but only abnormal results are displayed) Labs Reviewed - No data to display  EKG  EKG Interpretation None       Radiology No results found.  Procedures Procedures (including critical care time)  Medications Ordered in ED Medications - No data to display   Initial Impression / Assessment and Plan / ED Course  I have reviewed the triage vital signs and the nursing notes.  Pertinent labs & imaging results that were available during my care of the patient were reviewed by me and considered in my medical decision making (see chart for details).     Pt counseled on influenza course.  I advised tamiflu ay shorten course but is not a cure.   Final Clinical Impressions(s) / ED Diagnoses   Final diagnoses:  Influenza-like illness    ED Discharge Orders        Ordered    oseltamivir  (TAMIFLU) 75 MG capsule  Every 12 hours     07/10/17 1621    An After Visit Summary was printed and given to the patient.    Fransico Meadow, [place] 07/10/17 1657    Duffy Bruce, MD 07/11/17 [phone]

## 2017-07-10 NOTE — ED Notes (Signed)
Patient is A&Ox4.  No signs of distress noted.  Please see providers complete history and physical exam.  

## 2017-09-23 ENCOUNTER — Ambulatory Visit (INDEPENDENT_AMBULATORY_CARE_PROVIDER_SITE_OTHER): Payer: Self-pay | Admitting: Nurse Practitioner

## 2017-09-23 ENCOUNTER — Other Ambulatory Visit: Payer: Self-pay

## 2017-09-23 ENCOUNTER — Encounter: Payer: Self-pay | Admitting: Nurse Practitioner

## 2017-09-23 VITALS — BP 169/106 | HR 105 | Temp 98.7°F | Ht 69.0 in | Wt 237.0 lb

## 2017-09-23 DIAGNOSIS — K2100 Gastro-esophageal reflux disease with esophagitis, without bleeding: Secondary | ICD-10-CM

## 2017-09-23 DIAGNOSIS — Z800 Family history of malignant neoplasm of digestive organs: Secondary | ICD-10-CM

## 2017-09-23 DIAGNOSIS — K219 Gastro-esophageal reflux disease without esophagitis: Secondary | ICD-10-CM | POA: Insufficient documentation

## 2017-09-23 DIAGNOSIS — K769 Liver disease, unspecified: Secondary | ICD-10-CM

## 2017-09-23 MED ORDER — PEG 3350-KCL-NA BICARB-NACL 420 G PO SOLR: 4000.0000 mL | ORAL | 0 refills | Status: DC

## 2017-09-23 NOTE — Assessment & Plan Note (Signed)
Abnormal liver lesion as per HPI.  Recommended 1 year repeat MRI.  We will schedule this at this time.  Return for follow-up as needed.  If MRI results are abnormal or concerning we can bring him back sooner.

## 2017-09-23 NOTE — Progress Notes (Signed)
CC'ED TO PCP 

## 2017-09-23 NOTE — Assessment & Plan Note (Signed)
Chronic GERD, Dexilant works well.  However, his Dexilant became more expensive when his insurance changed.  It appears his co-pay card expired.  We will provide him with another co-pay card as he has commercial insurance which should help take his co-pay down to $20.  Return for follow-up based on post procedure recommendations or as needed.

## 2017-09-23 NOTE — Progress Notes (Signed)
Referring Provider: Raiford Simmonds., PA-C Primary Care Physician:  Raiford Simmonds., PA-C Primary GI:  Dr. Gala Romney  Chief Complaint  Patient presents with  . Colonoscopy    consult, family history colon cancer  . Bloated    HPI:   [name] is a 47 y.o. male who presents to schedule follow-up colonoscopy.  The patient last underwent colonoscopy 09/18/2016 for high risk screening.  Unable to complete colonoscopy due to noncompliance with prep instructions and subsequently a poor prep.  Recommended repeat exam in 3 months.  He canceled his rescheduled colonoscopy and was arranged for an office visit to reschedule.  He was a no-show to his office visit.  Today he does present to reschedule his exam.  Also of note, on 05/07/2017 there is a recall for MRI of the abdomen.  Last MRI of the abdomen found 05/27/2016 which found normal liver size and configuration, minimally complex 1.2 cm liver cyst with lobulated contour and thin internal septation of the anterior segment which correlates with the indeterminate liver lesion described on prior abdominal sonogram.  No additional lesions.  Recommended 71-month repeat MRI.  Today he states he's doing ok overall. He is high risk for colon ca due to family history. Admits he messed up his prep, states he understands he has to follow the instructions exactly. Also due for repeat MRI. His insurance changes and used to get Dexilant for a $20 copay, and now it's $300+. We can provide him with another copay card. Admits minor abdominal pain, occasional nausea. No vomiting. Denies hematochezia, melena, fever, chills, unintentional weight loss. Has chronic bloating and gas for a number of years; states it runs in the family. Takes MirLAX prn for intermittent constipation which works well for him. Denies chest pain, dyspnea, dizziness, lightheadedness, syncope, near syncope. Denies any other upper or lower GI symptoms.    Past Medical History:  Diagnosis  Date  . GERD (gastroesophageal reflux disease)   . Hepatic cyst    Benign by MRI  . Hiatal hernia   . History of colonic polyps   . Hypertension   . Rectal bleeding     Past Surgical History:  Procedure Laterality Date  . COLONOSCOPY  01/10/2009     RMR: Normal rectum, normal colon; repeat in 2015 due to [place] of colon cancer  . COLONOSCOPY N/A 01/09/2014   [id number] colonic polyps-removed as described  . COLONOSCOPY N/A 09/18/2016   Procedure: COLONOSCOPY;  Surgeon: Daneil Dolin, MD;  Location: [facility];  Service: Endoscopy;  Laterality: N/A;  730   . ESOPHAGOGASTRODUODENOSCOPY  10/04/2007   RMR: Distal esophageal erosions consistent with erosive reflux esophagitis, patulous gastroesophageal junction status post passage of a  Maloney dilator, 59 [place].  Otherwise, unremarkable esophagus.  Hiatal hernia.  Otherwise normal stomach.  Bulbar erosion  . ESOPHAGOGASTRODUODENOSCOPY N/A 01/09/2014   Erosive reflux esophagitis. Small hiatal hernia    Current Outpatient Medications  Medication Sig Dispense Refill  . DEXILANT 60 MG capsule TAKE 1 CAPSULE (60 MG TOTAL) BY MOUTH DAILY. 90 capsule 3  . polyethylene glycol (MIRALAX / GLYCOLAX) packet Take 17 g by mouth daily. (Patient taking differently: Take 17 g by mouth daily as needed. ) 14 each 0   No current facility-administered medications for this visit.     Allergies as of 09/23/2017  . (No Known Allergies)    Family History  Problem Relation Age of Onset  . Colon cancer Sister  passed away from colon cancer, in her 32s  . Diabetes Father   . Diabetes Other     Social History   Socioeconomic History  . Marital status: Single    Spouse name: Not on file  . Number of children: Not on file  . Years of education: Not on file  . Highest education level: Not on file  Occupational History  . Occupation: Scientist, physiological: [employer]    Comment: IT sales professional in [place]  .  Financial resource strain: Not on file  . Food insecurity:    Worry: Not on file    Inability: Not on file  . Transportation needs:    Medical: Not on file    Non-medical: Not on file  Tobacco Use  . Smoking status: Current Every Day Smoker    Packs/day: 0.25    Years: 10.00    Pack years: 2.50    Types: Cigarettes  . Smokeless tobacco: Never Used  Substance and Sexual Activity  . Alcohol use: Yes    Alcohol/week: 0.0 oz    Comment: occasional/wine on the weekends  . Drug use: No  . Sexual activity: Not on file  Lifestyle  . Physical activity:    Days per week: Not on file    Minutes per session: Not on file  . Stress: Not on file  Relationships  . Social connections:    Talks on phone: Not on file    Gets together: Not on file    Attends religious service: Not on file    Active member of club or organization: Not on file    Attends meetings of clubs or organizations: Not on file    Relationship status: Not on file  Other Topics Concern  . Not on file  Social History Narrative  . Not on file    Review of Systems: General: Negative for anorexia, weight loss, fever, chills, fatigue, weakness. ENT: Negative for hoarseness, difficulty swallowing. CV: Negative for chest pain, angina, palpitations, peripheral edema.  Respiratory: Negative for dyspnea at rest, cough, sputum, wheezing.  GI: See history of present illness. Endo: Negative for unusual weight change.  Heme: Negative for bruising or bleeding.   Physical Exam: BP (!) 169/106   Pulse (!) 105   Temp 98.7 F (37.1 C) (Oral)   Ht 5\' 9"  (1.753 m)   Wt 237 lb (107.5 kg)   BMI 35.00 kg/m  General:   Alert and oriented. Pleasant and cooperative. Well-nourished and well-developed.  Eyes:  Without icterus, sclera clear and conjunctiva pink.  Ears:  Normal auditory acuity. Cardiovascular:  S1, S2 present without murmurs appreciated. Extremities without clubbing or edema. Respiratory:  Clear to auscultation  bilaterally. No wheezes, rales, or rhonchi. No distress.  Gastrointestinal:  +BS, soft, non-tender and non-distended. No HSM noted. No guarding or rebound. No masses appreciated.  Rectal:  Deferred  Musculoskalatal:  Symmetrical without gross deformities. Skin:  Intact without significant lesions or rashes. Neurologic:  Alert and oriented x4;  grossly normal neurologically. Psych:  Alert and cooperative. Normal mood and affect.   09/23/2017 8:37 AM   Disclaimer: This note was dictated with voice recognition software. Similar sounding words can inadvertently be transcribed and may not be corrected upon review.

## 2017-09-23 NOTE — Assessment & Plan Note (Signed)
Family history of colon cancer in his sister who passed away in her 61s.  He has had 2 colonoscopies before.  His last colonoscopy had a poor prep.  He understands that he did not follow his instructions and he insists he will follow his instructions this time.  We will reschedule his colonoscopy for high risk cancer screening.  Return for follow-up based on recommendations made after the procedure.  Proceed with TCS with Dr. Gala Romney in near future: the risks, benefits, and alternatives have been discussed with the patient in detail. The patient states understanding and desires to proceed.  The patient is not on any anticoagulants, anxiolytics, chronic pain medications, or antidepressants.  Social alcohol use, denies drug use.  Conscious sedation should be adequate for his procedure as it was for his last.

## 2017-09-23 NOTE — Patient Instructions (Signed)
1. We will schedule your procedure for you. 2. We will help schedule your MRI for you. 3. I am providing you with a co-pay card for Dexilant which should reduce her co-pay to as little as $20. 4. We will provide you with a work note. 5. Further recommendations will be made after your colonoscopy and MRI. 6. Return for follow-up based on recommendations made after your procedure. 7. Call us if you have any questions or concerns.   At [facility] we value your feedback. You may receive a survey about your visit today. Please share your experience as we strive to create trusting relationships with our patients to provide genuine, compassionate, quality care.  It was good to see you today!  I hope you have a wonderful summer (and don't eat too much??)   : )

## 2017-10-23 ENCOUNTER — Telehealth: Payer: Self-pay

## 2017-10-23 NOTE — Telephone Encounter (Signed)
Pt stated at last OV he started new job and would bring new insurance card to office in approx 2 weeks to set-up MRI Liver. Tried to call pt, no answer, LMOVM to see if he has received insurance card so MRI can be scheduled.

## 2017-10-26 NOTE — Telephone Encounter (Signed)
Letter mailed to pt.  

## 2017-11-20 ENCOUNTER — Encounter (HOSPITAL_COMMUNITY): Admission: RE | Payer: Self-pay | Source: Ambulatory Visit

## 2017-11-20 ENCOUNTER — Telehealth: Payer: Self-pay | Admitting: Internal Medicine

## 2017-11-20 ENCOUNTER — Ambulatory Visit (HOSPITAL_COMMUNITY): Admission: RE | Admit: 2017-11-20 | Payer: Self-pay | Source: Ambulatory Visit | Admitting: Internal Medicine

## 2017-11-20 SURGERY — COLONOSCOPY
Anesthesia: Moderate Sedation

## 2017-11-20 NOTE — Telephone Encounter (Signed)
Melanie from Short Stay called to let us know that patient didn't show up for his procedure today and can not get in touch with him.

## 2017-11-20 NOTE — Telephone Encounter (Signed)
Routing to EG.  [name], also see phone note from 10/23/17. He didn't contact us back to schedule MRI.

## 2017-12-07 NOTE — Telephone Encounter (Signed)
Please try and call the patient to find out what's going on. Send another letter requesting he call our office (if unable to get him on the phone.)  If no response may need to cc CM as FYI on non-follow-up and no show.

## 2017-12-07 NOTE — Telephone Encounter (Signed)
Tried to call pt, no answer. Letter mailed.

## 2017-12-07 NOTE — Telephone Encounter (Signed)
Noted  

## 2018-07-26 ENCOUNTER — Encounter (HOSPITAL_COMMUNITY)
Admission: EM | Disposition: A | Discharge status: Home / Self Care | Payer: Self-pay | Source: Home / Self Care | Attending: Emergency Medicine

## 2018-07-26 ENCOUNTER — Ambulatory Visit (HOSPITAL_COMMUNITY)
Admission: RE | Admit: 2018-07-26 | Discharge: 2018-07-26 | Disposition: A | Discharge status: Home / Self Care | Payer: PRIVATE HEALTH INSURANCE | Source: Ambulatory Visit | Attending: General Surgery | Admitting: General Surgery

## 2018-07-26 ENCOUNTER — Emergency Department (HOSPITAL_COMMUNITY): Payer: PRIVATE HEALTH INSURANCE

## 2018-07-26 ENCOUNTER — Encounter (HOSPITAL_COMMUNITY): Payer: Self-pay

## 2018-07-26 ENCOUNTER — Ambulatory Visit (HOSPITAL_COMMUNITY): Payer: PRIVATE HEALTH INSURANCE | Admitting: Anesthesiology

## 2018-07-26 ENCOUNTER — Encounter (HOSPITAL_COMMUNITY)
Admission: RE | Disposition: A | Discharge status: Home / Self Care | Payer: Self-pay | Source: Ambulatory Visit | Attending: General Surgery

## 2018-07-26 ENCOUNTER — Encounter (HOSPITAL_COMMUNITY): Payer: Self-pay | Admitting: Emergency Medicine

## 2018-07-26 ENCOUNTER — Other Ambulatory Visit: Payer: Self-pay

## 2018-07-26 ENCOUNTER — Emergency Department (HOSPITAL_COMMUNITY)
Admission: EM | Admit: 2018-07-26 | Discharge: 2018-07-26 | Disposition: A | Discharge status: Home / Self Care | Payer: PRIVATE HEALTH INSURANCE | Source: Home / Self Care | Attending: Emergency Medicine | Admitting: Emergency Medicine

## 2018-07-26 ENCOUNTER — Ambulatory Visit: Admit: 2018-07-26 | Payer: Self-pay | Admitting: General Surgery

## 2018-07-26 DIAGNOSIS — I10 Essential (primary) hypertension: Secondary | ICD-10-CM | POA: Insufficient documentation

## 2018-07-26 DIAGNOSIS — S62323B Displaced fracture of shaft of third metacarpal bone, left hand, initial encounter for open fracture: Secondary | ICD-10-CM | POA: Insufficient documentation

## 2018-07-26 DIAGNOSIS — Z79899 Other long term (current) drug therapy: Secondary | ICD-10-CM | POA: Insufficient documentation

## 2018-07-26 DIAGNOSIS — Y9389 Activity, other specified: Secondary | ICD-10-CM

## 2018-07-26 DIAGNOSIS — Y990 Civilian activity done for income or pay: Secondary | ICD-10-CM | POA: Insufficient documentation

## 2018-07-26 DIAGNOSIS — W230XXA Caught, crushed, jammed, or pinched between moving objects, initial encounter: Secondary | ICD-10-CM | POA: Insufficient documentation

## 2018-07-26 DIAGNOSIS — F17200 Nicotine dependence, unspecified, uncomplicated: Secondary | ICD-10-CM | POA: Diagnosis not present

## 2018-07-26 DIAGNOSIS — S62321A Displaced fracture of shaft of second metacarpal bone, left hand, initial encounter for closed fracture: Secondary | ICD-10-CM

## 2018-07-26 DIAGNOSIS — F17210 Nicotine dependence, cigarettes, uncomplicated: Secondary | ICD-10-CM

## 2018-07-26 DIAGNOSIS — Y929 Unspecified place or not applicable: Secondary | ICD-10-CM | POA: Insufficient documentation

## 2018-07-26 DIAGNOSIS — S6722XA Crushing injury of left hand, initial encounter: Secondary | ICD-10-CM | POA: Diagnosis present

## 2018-07-26 DIAGNOSIS — Z23 Encounter for immunization: Secondary | ICD-10-CM

## 2018-07-26 DIAGNOSIS — S62301B Unspecified fracture of second metacarpal bone, left hand, initial encounter for open fracture: Secondary | ICD-10-CM | POA: Insufficient documentation

## 2018-07-26 DIAGNOSIS — S62321B Displaced fracture of shaft of second metacarpal bone, left hand, initial encounter for open fracture: Secondary | ICD-10-CM

## 2018-07-26 DIAGNOSIS — S61412A Laceration without foreign body of left hand, initial encounter: Secondary | ICD-10-CM | POA: Diagnosis not present

## 2018-07-26 DIAGNOSIS — K219 Gastro-esophageal reflux disease without esophagitis: Secondary | ICD-10-CM | POA: Insufficient documentation

## 2018-07-26 HISTORY — PX: I & D EXTREMITY: SHX5045

## 2018-07-26 HISTORY — PX: PERCUTANEOUS PINNING: SHX2209

## 2018-07-26 LAB — CBC
HCT: 48.9 % (ref 39.0–52.0)
Hemoglobin: 15 g/dL (ref 13.0–17.0)
MCH: 27.5 pg (ref 26.0–34.0)
MCHC: 30.7 g/dL (ref 30.0–36.0)
MCV: 89.6 fL (ref 80.0–100.0)
Platelets: 259 10*3/uL (ref 150–400)
RBC: 5.46 MIL/uL (ref 4.22–5.81)
RDW: 12.3 % (ref 11.5–15.5)
WBC: 10.6 10*3/uL — ABNORMAL HIGH (ref 4.0–10.5)
nRBC: 0 % (ref 0.0–0.2)

## 2018-07-26 LAB — BASIC METABOLIC PANEL
Anion gap: 9 (ref 5–15)
BUN: 13 mg/dL (ref 6–20)
CO2: 24 mmol/L (ref 22–32)
Calcium: 9.3 mg/dL (ref 8.9–10.3)
Chloride: 103 mmol/L (ref 98–111)
Creatinine, Ser: 1.13 mg/dL (ref 0.61–1.24)
GFR calc Af Amer: >60 mL/min (ref >60)
GFR calc non Af Amer: >60 mL/min (ref >60)
Glucose, Bld: 114 mg/dL — ABNORMAL HIGH (ref 70–99)
Potassium: 4.2 mmol/L (ref 3.5–5.1)
Sodium: 136 mmol/L (ref 135–145)

## 2018-07-26 LAB — SURGICAL PCR SCREEN
MRSA, PCR: NEGATIVE
Staphylococcus aureus: NEGATIVE

## 2018-07-26 LAB — GLUCOSE, CAPILLARY: Glucose-Capillary: 114 mg/dL — ABNORMAL HIGH (ref 70–99)

## 2018-07-26 SURGERY — IRRIGATION AND DEBRIDEMENT EXTREMITY
Anesthesia: General | Laterality: Left

## 2018-07-26 SURGERY — IRRIGATION AND DEBRIDEMENT EXTREMITY
Anesthesia: Choice | Laterality: Left

## 2018-07-26 MED ORDER — FENTANYL CITRATE (PF) 100 MCG/2ML IJ SOLN: 25.0000 ug | INTRAMUSCULAR | Status: DC | PRN

## 2018-07-26 MED ORDER — POVIDONE-IODINE 10 % EX SWAB: 2.0000 "application " | Freq: Once | CUTANEOUS | Status: DC

## 2018-07-26 MED ORDER — ONDANSETRON HCL 4 MG/2ML IJ SOLN: 4.0000 mg | Freq: Once | INTRAMUSCULAR | Status: DC | PRN

## 2018-07-26 MED ORDER — MIDAZOLAM HCL 2 MG/2ML IJ SOLN
INTRAMUSCULAR | Status: AC
  Administered 2018-07-26: 1 mg via INTRAVENOUS
  Filled 2018-07-26: qty 2

## 2018-07-26 MED ORDER — OXYCODONE HCL 5 MG PO TABS: 5.0000 mg | ORAL_TABLET | Freq: Once | ORAL | Status: DC | PRN

## 2018-07-26 MED ORDER — FENTANYL CITRATE (PF) 100 MCG/2ML IJ SOLN
INTRAMUSCULAR | Status: AC
  Administered 2018-07-26: 50 ug via INTRAVENOUS
  Filled 2018-07-26: qty 2

## 2018-07-26 MED ORDER — TETANUS-DIPHTH-ACELL PERTUSSIS 5-2.5-18.5 LF-MCG/0.5 IM SUSP
0.5000 mL | Freq: Once | INTRAMUSCULAR | Status: AC
  Administered 2018-07-26: 0.5 mL via INTRAMUSCULAR
  Filled 2018-07-26: qty 0.5

## 2018-07-26 MED ORDER — FENTANYL CITRATE (PF) 100 MCG/2ML IJ SOLN
50.0000 ug | Freq: Once | INTRAMUSCULAR | Status: AC
  Administered 2018-07-26: 50 ug via INTRAVENOUS

## 2018-07-26 MED ORDER — MIDAZOLAM HCL 2 MG/2ML IJ SOLN
1.0000 mg | Freq: Once | INTRAMUSCULAR | Status: AC
  Administered 2018-07-26: 1 mg via INTRAVENOUS

## 2018-07-26 MED ORDER — HYDROMORPHONE HCL 1 MG/ML IJ SOLN
1.0000 mg | Freq: Once | INTRAMUSCULAR | Status: AC
  Administered 2018-07-26: 1 mg via INTRAVENOUS
  Filled 2018-07-26: qty 1

## 2018-07-26 MED ORDER — DEXAMETHASONE SODIUM PHOSPHATE 10 MG/ML IJ SOLN
INTRAMUSCULAR | Status: AC
  Filled 2018-07-26: qty 1

## 2018-07-26 MED ORDER — 0.9 % SODIUM CHLORIDE (POUR BTL) OPTIME
TOPICAL | Status: DC | PRN
  Administered 2018-07-26: 1000 mL

## 2018-07-26 MED ORDER — PROPOFOL 1000 MG/100ML IV EMUL
INTRAVENOUS | Status: AC
  Filled 2018-07-26: qty 100

## 2018-07-26 MED ORDER — FENTANYL CITRATE (PF) 250 MCG/5ML IJ SOLN
INTRAMUSCULAR | Status: AC
  Filled 2018-07-26: qty 5

## 2018-07-26 MED ORDER — CEFAZOLIN SODIUM-DEXTROSE 2-4 GM/100ML-% IV SOLN
2.0000 g | INTRAVENOUS | Status: AC
  Administered 2018-07-26: 2 g via INTRAVENOUS
  Filled 2018-07-26: qty 100

## 2018-07-26 MED ORDER — CEPHALEXIN 500 MG PO CAPS: 500.0000 mg | ORAL_CAPSULE | Freq: Four times a day (QID) | ORAL | 0 refills | Status: DC

## 2018-07-26 MED ORDER — CHLORHEXIDINE GLUCONATE 4 % EX LIQD: 60.0000 mL | Freq: Once | CUTANEOUS | Status: DC

## 2018-07-26 MED ORDER — PROPOFOL 500 MG/50ML IV EMUL
INTRAVENOUS | Status: DC | PRN
  Administered 2018-07-26: 100 ug/kg/min via INTRAVENOUS

## 2018-07-26 MED ORDER — LIDOCAINE 2% (20 MG/ML) 5 ML SYRINGE
INTRAMUSCULAR | Status: AC
  Filled 2018-07-26: qty 5

## 2018-07-26 MED ORDER — LIDOCAINE 2% (20 MG/ML) 5 ML SYRINGE
INTRAMUSCULAR | Status: DC | PRN
  Administered 2018-07-26: 20 mg via INTRAVENOUS

## 2018-07-26 MED ORDER — HYDROCODONE-ACETAMINOPHEN 7.5-325 MG PO TABS: 1.0000 | ORAL_TABLET | Freq: Four times a day (QID) | ORAL | 0 refills | Status: DC | PRN

## 2018-07-26 MED ORDER — PROPOFOL 10 MG/ML IV BOLUS
INTRAVENOUS | Status: DC | PRN
  Administered 2018-07-26: 30 mg via INTRAVENOUS
  Administered 2018-07-26: 20 mg via INTRAVENOUS

## 2018-07-26 MED ORDER — LACTATED RINGERS IV SOLN
INTRAVENOUS | Status: DC
  Administered 2018-07-26: 14:00:00 via INTRAVENOUS

## 2018-07-26 MED ORDER — FENTANYL CITRATE (PF) 100 MCG/2ML IJ SOLN
50.0000 ug | INTRAMUSCULAR | Status: DC | PRN
  Administered 2018-07-26: 50 ug via INTRAVENOUS

## 2018-07-26 MED ORDER — MIDAZOLAM HCL 2 MG/2ML IJ SOLN
INTRAMUSCULAR | Status: DC | PRN
  Administered 2018-07-26: 2 mg via INTRAVENOUS

## 2018-07-26 MED ORDER — PROPOFOL 10 MG/ML IV BOLUS
INTRAVENOUS | Status: AC
  Filled 2018-07-26: qty 20

## 2018-07-26 MED ORDER — ONDANSETRON HCL 4 MG/2ML IJ SOLN
INTRAMUSCULAR | Status: AC
  Filled 2018-07-26: qty 2

## 2018-07-26 MED ORDER — MIDAZOLAM HCL 2 MG/2ML IJ SOLN
INTRAMUSCULAR | Status: AC
  Filled 2018-07-26: qty 2

## 2018-07-26 MED ORDER — ROCURONIUM BROMIDE 50 MG/5ML IV SOSY
PREFILLED_SYRINGE | INTRAVENOUS | Status: AC
  Filled 2018-07-26: qty 5

## 2018-07-26 MED ORDER — ONDANSETRON HCL 4 MG/2ML IJ SOLN
INTRAMUSCULAR | Status: DC | PRN
  Administered 2018-07-26: 4 mg via INTRAVENOUS

## 2018-07-26 MED ORDER — OXYCODONE HCL 5 MG/5ML PO SOLN: 5.0000 mg | Freq: Once | ORAL | Status: DC | PRN

## 2018-07-26 MED ORDER — CEFAZOLIN SODIUM-DEXTROSE 1-4 GM/50ML-% IV SOLN
1.0000 g | Freq: Once | INTRAVENOUS | Status: AC
  Administered 2018-07-26: 1 g via INTRAVENOUS
  Filled 2018-07-26: qty 50

## 2018-07-26 SURGICAL SUPPLY — 71 items
BAG DECANTER FOR FLEXI CONT (MISCELLANEOUS) ×3 IMPLANT
BANDAGE ACE 3X5.8 VEL STRL LF (GAUZE/BANDAGES/DRESSINGS) ×3 IMPLANT
BANDAGE ACE 4X5 VEL STRL LF (GAUZE/BANDAGES/DRESSINGS) IMPLANT
BANDAGE ELASTIC 3 VELCRO ST LF (GAUZE/BANDAGES/DRESSINGS) ×3 IMPLANT
BNDG ESMARK 4X9 LF (GAUZE/BANDAGES/DRESSINGS) ×3 IMPLANT
BNDG GAUZE ELAST 4 BULKY (GAUZE/BANDAGES/DRESSINGS) IMPLANT
CANISTER SUCTION 1500CC (MISCELLANEOUS) ×3 IMPLANT
CHLORAPREP W/TINT 26ML (MISCELLANEOUS) ×3 IMPLANT
CORDS BIPOLAR (ELECTRODE) ×3 IMPLANT
COVER SURGICAL LIGHT HANDLE (MISCELLANEOUS) ×3 IMPLANT
COVER WAND RF STERILE (DRAPES) ×3 IMPLANT
CUFF TOURNIQUET SINGLE 18IN (TOURNIQUET CUFF) ×3 IMPLANT
CUFF TOURNIQUET SINGLE 24IN (TOURNIQUET CUFF) IMPLANT
DRAIN TLS ROUND 10FR (DRAIN) IMPLANT
DRAPE OEC MINIVIEW 54X84 (DRAPES) ×3 IMPLANT
DRAPE SURG 17X23 STRL (DRAPES) ×3 IMPLANT
DRSG XEROFORM 1X8 (GAUZE/BANDAGES/DRESSINGS) ×3 IMPLANT
ELECT REM PT RETURN 9FT ADLT (ELECTROSURGICAL)
ELECTRODE REM PT RTRN 9FT ADLT (ELECTROSURGICAL) IMPLANT
FLUID NSS /IRRIG 3000 ML XXX (IV SOLUTION) IMPLANT
GAUZE PACKING IODOFORM 1/4X5 (PACKING) IMPLANT
GAUZE SPONGE 4X4 12PLY STRL (GAUZE/BANDAGES/DRESSINGS) ×3 IMPLANT
GAUZE XEROFORM 1X8 LF (GAUZE/BANDAGES/DRESSINGS) ×3 IMPLANT
GLOVE BIOGEL M 8.0 STRL (GLOVE) ×3 IMPLANT
GOWN STRL REUS W/ TWL LRG LVL3 (GOWN DISPOSABLE) ×2 IMPLANT
GOWN STRL REUS W/ TWL XL LVL3 (GOWN DISPOSABLE) ×2 IMPLANT
GOWN STRL REUS W/TWL LRG LVL3 (GOWN DISPOSABLE) ×4
GOWN STRL REUS W/TWL XL LVL3 (GOWN DISPOSABLE) ×4
HANDPIECE INTERPULSE COAX TIP (DISPOSABLE)
K-WIRE SURGICAL 1.6X102 (WIRE) ×3 IMPLANT
KIT BASIN OR (CUSTOM PROCEDURE TRAY) ×3 IMPLANT
KIT TURNOVER KIT B (KITS) ×3 IMPLANT
MANIFOLD NEPTUNE II (INSTRUMENTS) ×3 IMPLANT
NAIL FIXATION IM (Nail) ×3 IMPLANT
NAIL KIT 1.6X12.7 FLEX SS (Nail) ×3 IMPLANT
NEEDLE HYPO 22GX1.5 SAFETY (NEEDLE) IMPLANT
NEEDLE HYPO 25GX1X1/2 BEV (NEEDLE) IMPLANT
NS IRRIG 1000ML POUR BTL (IV SOLUTION) ×3 IMPLANT
PACK ORTHO EXTREMITY (CUSTOM PROCEDURE TRAY) ×3 IMPLANT
PAD ARMBOARD 7.5X6 YLW CONV (MISCELLANEOUS) ×6 IMPLANT
PAD CAST 3X4 CTTN HI CHSV (CAST SUPPLIES) ×1 IMPLANT
PAD CAST 4YDX4 CTTN HI CHSV (CAST SUPPLIES) IMPLANT
PADDING CAST ABS 4INX4YD NS (CAST SUPPLIES) ×2
PADDING CAST ABS COTTON 4X4 ST (CAST SUPPLIES) ×1 IMPLANT
PADDING CAST COTTON 3X4 STRL (CAST SUPPLIES) ×2
PADDING CAST COTTON 4X4 STRL (CAST SUPPLIES)
SET CYSTO W/LG BORE CLAMP LF (SET/KITS/TRAYS/PACK) IMPLANT
SET HNDPC FAN SPRY TIP SCT (DISPOSABLE) IMPLANT
SOAP 2 % CHG 4 OZ (WOUND CARE) ×3 IMPLANT
SPLINT FIBERGLASS 3X12 (CAST SUPPLIES) ×3 IMPLANT
SPLINT PLASTER CAST XFAST 4X15 (CAST SUPPLIES) IMPLANT
SPLINT PLASTER XTRA FAST SET 4 (CAST SUPPLIES)
SPONGE LAP 18X18 RF (DISPOSABLE) IMPLANT
SPONGE LAP 4X18 RFD (DISPOSABLE) ×3 IMPLANT
SUT ETHILON 3 0 PS 1 (SUTURE) ×9 IMPLANT
SUT ETHILON 4 0 PS 2 18 (SUTURE) ×3 IMPLANT
SUT VIC AB 3-0 PS1 18 (SUTURE) ×2
SUT VIC AB 3-0 PS1 18XBRD (SUTURE) ×1 IMPLANT
SUT VICRYL 4-0 PS2 18IN ABS (SUTURE) ×3 IMPLANT
SWAB CULTURE ESWAB REG 1ML (MISCELLANEOUS) IMPLANT
SYR BULB 3OZ (MISCELLANEOUS) ×3 IMPLANT
SYR CONTROL 10ML LL (SYRINGE) IMPLANT
TOWEL OR 17X24 6PK STRL BLUE (TOWEL DISPOSABLE) ×3 IMPLANT
TOWEL OR 17X26 10 PK STRL BLUE (TOWEL DISPOSABLE) ×3 IMPLANT
TUBE CONNECTING 12'X1/4 (SUCTIONS) ×1
TUBE CONNECTING 12X1/4 (SUCTIONS) ×2 IMPLANT
TUBE CONNECTING 20'X1/4 (TUBING) ×1
TUBE CONNECTING 20X1/4 (TUBING) ×2 IMPLANT
UNDERPAD 30X30 (UNDERPADS AND DIAPERS) ×3 IMPLANT
WATER STERILE IRR 1000ML POUR (IV SOLUTION) ×3 IMPLANT
YANKAUER SUCT BULB TIP NO VENT (SUCTIONS) ×3 IMPLANT

## 2018-07-26 NOTE — ED Provider Notes (Signed)
[facility] DEPT Provider Note   CSN: [id number] Arrival date & time: 07/26/18  0849    History   Chief Complaint Chief Complaint  Patient presents with  . Hand Injury    HPI CADYN FANN is a 48 y.o. male.     HPI  48 year old male presents with a left hand injury.  It got caught in a machine at work.  He has a dorsal laceration.  No weakness or numbness but is very hard to range his hand due to the pain. Patient is right handed. Unknown last tetanus immunization.  Past Medical History:  Diagnosis Date  . GERD (gastroesophageal reflux disease)   . Hepatic cyst    Benign by MRI  . Hiatal hernia   . History of colonic polyps   . Hypertension   . Rectal bleeding     Patient Active Problem List   Diagnosis Date Noted  . Lesion of liver 09/23/2017  . Family history of colon cancer 05/01/2016  . Chest pain 05/01/2016  . Abnormal ultrasound 07/19/2015  . Erosive esophagitis 04/11/2014  . Hx of adenomatous colonic polyps 04/11/2014  . Abdominal pain 06/01/2012  . Rectal bleeding 06/01/2012  . Constipation 06/01/2012  . ALCOHOL ABUSE 09/06/2008  . TOBACCO ABUSE 09/06/2008  . HYPERTENSION 09/06/2008  . GASTROESOPHAGEAL REFLUX DISEASE, CHRONIC 09/06/2008  . CONSTIPATION, CHRONIC 09/06/2008  . DYSPHAGIA UNSPECIFIED 09/06/2008    Past Surgical History:  Procedure Laterality Date  . COLONOSCOPY  01/10/2009     RMR: Normal rectum, normal colon; repeat in 2015 due to [place] of colon cancer  . COLONOSCOPY N/A 01/09/2014   [id number] colonic polyps-removed as described  . COLONOSCOPY N/A 09/18/2016   Procedure: COLONOSCOPY;  Surgeon: Daneil Dolin, MD;  Location: [facility];  Service: Endoscopy;  Laterality: N/A;  730   . ESOPHAGOGASTRODUODENOSCOPY  10/04/2007   RMR: Distal esophageal erosions consistent with erosive reflux esophagitis, patulous gastroesophageal junction status post passage of a  Maloney dilator, [address] [place].  Otherwise,  unremarkable esophagus.  Hiatal hernia.  Otherwise normal stomach.  Bulbar erosion  . ESOPHAGOGASTRODUODENOSCOPY N/A 01/09/2014   Erosive reflux esophagitis. Small hiatal hernia        Home Medications    Prior to Admission medications   Medication Sig Start Date End Date Taking? Authorizing Provider  polyvinyl alcohol (LIQUIFILM TEARS) 1.4 % ophthalmic solution Place 1 drop into both eyes as needed for dry eyes.   Yes [provider]  saccharomyces boulardii (FLORASTOR) 250 MG capsule Take 250 mg by mouth daily as needed (bowel movements).   Yes [provider]  sodium-potassium bicarbonate (ALKA-SELTZER GOLD) TBEF dissolvable tablet Take 1 tablet by mouth daily as needed (cold symptoms).   Yes [provider]  calcium carbonate (TUMS EX) 750 MG chewable tablet Chew 2 tablets by mouth as needed for heartburn.    [provider]  DEXILANT 60 MG capsule TAKE 1 CAPSULE (60 MG TOTAL) BY MOUTH DAILY. Patient not taking: Reported on 11/16/2017 06/16/17   Annitta Needs, NP  famotidine (ACID CONTROL MAXIMUM STRENGTH) 20 MG tablet Take 20 mg by mouth as needed for heartburn or indigestion.    [provider]  polyethylene glycol (MIRALAX / GLYCOLAX) packet Take 17 g by mouth daily. Patient taking differently: Take 17 g by mouth daily as needed for mild constipation.  11/19/15   Charlesetta Shanks, MD  polyethylene glycol-electrolytes (TRILYTE) 420 g solution Take 4,000 mLs by mouth as directed. Patient not taking:  Reported on 07/26/2018 09/23/17   Daneil Dolin, MD    Family History Family History  Problem Relation Age of Onset  . Colon cancer Sister        passed away from colon cancer, in her 31s  . Diabetes Father   . Diabetes Other     Social History Social History   Tobacco Use  . Smoking status: Current Every Day Smoker    Packs/day: 0.25    Years: 10.00    Pack years: 2.50    Types: Cigarettes  . Smokeless tobacco: Never Used  Substance  Use Topics  . Alcohol use: Yes    Alcohol/week: 0.0 standard drinks    Comment: occasional/wine on the weekends  . Drug use: No     Allergies   Patient has no known allergies.   Review of Systems Review of Systems  Musculoskeletal: Positive for arthralgias and joint swelling.  Skin: Positive for wound.     Physical Exam Updated Vital Signs BP (!) 145/93   Pulse 71   Temp 98.8 F (37.1 C) (Oral)   Resp 18   Ht 5\' 9"  (1.753 m)   Wt 104.3 kg   SpO2 97%   BMI 33.97 kg/m   Physical Exam Vitals signs and nursing note reviewed.  Constitutional:      Appearance: He is well-developed. He is obese.  HENT:     Head: Normocephalic and atraumatic.     Right Ear: External ear normal.     Left Ear: External ear normal.     Nose: Nose normal.  Eyes:     General:        Right eye: No discharge.        Left eye: No discharge.  Neck:     Musculoskeletal: Neck supple.  Pulmonary:     Effort: Pulmonary effort is normal.  Musculoskeletal:     Left hand: He exhibits tenderness, laceration and swelling. He exhibits no deformity.       Hands:     Comments: Patient is able to grip my hand but it is very painful. He can extend digits 2 and 3 but seems limited due to pain, unclear if from tendon injury. Normal sensation.  Skin:    General: Skin is warm and dry.  Neurological:     Mental Status: He is alert.  Psychiatric:        Mood and Affect: Mood is not anxious.          ED Treatments / Results  Labs (all labs ordered are listed, but only abnormal results are displayed) Labs Reviewed - No data to display  EKG None  Radiology Dg Hand Complete Left  Result Date: 07/26/2018 CLINICAL DATA:  Crush injury to right hand at work with dorsal pain. EXAM: LEFT HAND - COMPLETE 3+ VIEW COMPARISON:  None. FINDINGS: Exam demonstrates a displaced transverse fracture through the mid-diaphysis of the second metacarpal. There is a transverse fracture without significant displacement  to the mid diaphysis of the third metacarpal. Minimal degenerative change over the second and third MCP joints. Degenerative change over the first interphalangeal joint. IMPRESSION: Transverse fractures through the mid-diaphysis of the second and third metacarpals. Moderate displacement of the second metacarpal fracture. Electronically Signed   By: Marin Olp M.D.   On: 07/26/2018 10:51    Procedures Procedures (including critical care time)  Medications Ordered in ED Medications  Tdap (BOOSTRIX) injection 0.5 mL (0.5 mLs Intramuscular Given 07/26/18 1033)  ceFAZolin (ANCEF) IVPB  1 g/50 mL premix (0 g Intravenous Stopped 07/26/18 1111)  HYDROmorphone (DILAUDID) injection 1 mg (1 mg Intravenous Given 07/26/18 1031)  HYDROmorphone (DILAUDID) injection 1 mg (1 mg Intravenous Given 07/26/18 1137)     Initial Impression / Assessment and Plan / ED Course  I have reviewed the triage vital signs and the nursing notes.  Pertinent labs & imaging results that were available during my care of the patient were reviewed by me and considered in my medical decision making (see chart for details).        Patient has open metacarpal fractures.  Unclear if his mild weakness with extension is from tendon laceration versus pain from the fracture and laceration.  He has been given IV Ancef, tetanus immunization, and IV Dilaudid for pain.  Wound will be irrigated in the ED.  I discussed with Hilbert Odor who has seen patient as well as Dr. Lenon Curt, who asked for patient to be discharged and to go straight over to [facility] admitting for operative washout and repair.  Final Clinical Impressions(s) / ED Diagnoses   Final diagnoses:  Open displaced fracture of shaft of second metacarpal bone of left hand, initial encounter    ED Discharge Orders    None       Sherwood Gambler, MD 07/26/18 [phone]

## 2018-07-26 NOTE — Progress Notes (Signed)
Notified Dr. Grayce Sessions of patient's blood pressure.  Patient states that he his pain is 9/10.  Received verbal order to give 50-100 mcg Fentanyl.  Will continue to monitor patient.

## 2018-07-26 NOTE — Progress Notes (Signed)
Patient c/o eye hurting after the CHG solution in the CHG wipes splashed in his left eye.  Flushed eye with saline rinse.  Eye red but patient states it is feeling better and "not hurting"

## 2018-07-26 NOTE — Anesthesia Procedure Notes (Signed)
Anesthesia Regional Block: Supraclavicular block   Pre-Anesthetic Checklist: ,, timeout performed, Correct Patient, Correct Site, Correct Laterality, Correct Procedure, Correct Position, site marked, Risks and benefits discussed,  Surgical consent,  Pre-op evaluation,  At surgeon's request and post-op pain management  Laterality: Left  Prep: chloraprep       Needles:  Injection technique: Single-shot  Needle Type: Stimulator Needle - 80          Additional Needles:   Procedures:, nerve stimulator,,,,,,,  Narrative:  Start time: 07/26/2018 4:55 PM End time: 07/26/2018 5:05 PM Injection made incrementally with aspirations every 5 mL.  Performed by: Personally   Additional Notes: 20 cc 0.75% Ropivacaine injected easily

## 2018-07-26 NOTE — Transfer of Care (Signed)
Immediate Anesthesia Transfer of Care Note  Patient: [name]  Procedure(s) Performed: IRRIGATION AND DEBRIDEMENT EXTREMITY (Left ) PERCUTANEOUS PINNING EXTREMITY (Left )  Patient Location: PACU  Anesthesia Type:MAC and Regional  Level of Consciousness: awake, alert  and oriented  Airway & Oxygen Therapy: Patient Spontanous Breathing  Post-op Assessment: Report given to RN  Post vital signs: Reviewed and stable  Last Vitals:  Vitals Value Taken Time  BP 130/79 07/26/2018  6:47 PM  Temp    Pulse 76 07/26/2018  6:50 PM  Resp 17 07/26/2018  6:50 PM  SpO2 100 % 07/26/2018  6:50 PM  Vitals shown include unvalidated device data.  Last Pain:  Vitals:   07/26/18 1425  TempSrc:   PainSc: 8       Patients Stated Pain Goal: 2 (45/03/88 8280)  Complications: No apparent anesthesia complications

## 2018-07-26 NOTE — Anesthesia Preprocedure Evaluation (Signed)
Anesthesia Evaluation  Patient identified by MRN, date of birth, ID band Patient awake    Reviewed: Allergy & Precautions, NPO status , Patient's Chart, lab work & pertinent test results  Airway Mallampati: II  TM Distance: >3 FB Neck ROM: Full    Dental  (+) Teeth Intact, Dental Advisory Given   Pulmonary Current Smoker,    breath sounds clear to auscultation       Cardiovascular hypertension,  Rhythm:Regular Rate:Normal     Neuro/Psych    GI/Hepatic   Endo/Other    Renal/GU      Musculoskeletal   Abdominal   Peds  Hematology   Anesthesia Other Findings   Reproductive/Obstetrics                             Anesthesia Physical Anesthesia Plan  ASA: II  Anesthesia Plan: General   Post-op Pain Management:    Induction: Intravenous  PONV Risk Score and Plan: Ondansetron and Dexamethasone  Airway Management Planned: Oral ETT  Additional Equipment:   Intra-op Plan:   Post-operative Plan: Extubation in OR  Informed Consent: I have reviewed the patients History and Physical, chart, labs and discussed the procedure including the risks, benefits and alternatives for the proposed anesthesia with the patient or authorized representative who has indicated his/her understanding and acceptance.     Dental advisory given  Plan Discussed with: CRNA and Anesthesiologist  Anesthesia Plan Comments:         Anesthesia Quick Evaluation

## 2018-07-26 NOTE — Progress Notes (Signed)
Orthopedic Tech Progress Note Patient Details:  [name] 1970-08-25 [id number]  Ortho Devices Type of Ortho Device: Arm sling Ortho Device/Splint Location: lue Ortho Device/Splint Interventions: Ordered, Application, Adjustment   Post Interventions Patient Tolerated: Well Instructions Provided: Care of device, Adjustment of device   Karolee Stamps 07/26/2018, 8:05 PM

## 2018-07-26 NOTE — Discharge Instructions (Signed)
Discharge Instructions:  Keep your dressing clean, dry and in place until instructed to remove by Dr. Carlyne Keehan.  If the dressing becomes dirty or wet call the office for instructions during business hours. Elevate the extremity to help with swelling, this will also help with any discomfort. Take your medication as prescribed. No lifting with the injured  extremity. If you feel that the dressing is too tight, you may loosen it, but keep it on; finger tips should be pink; if there is a concern, call the office. [phone] Ice may be used if the injury is a fracture, do not apply ice directly to the skin. Please call the office on the next business day after discharge to arrange a follow up appointment.  Call [phone] between the hours of 9am - 5pm M-Th or 9am - 1pm on Fri. For most hand injuries and/or conditions, you may return to work using the uninjured hand (one handed duty) within 24-72 hours.  A detailed note will be provided to you at your follow up appointment or may contact the office prior to your follow up.    

## 2018-07-26 NOTE — Consult Note (Deleted)
  The note originally documented on this encounter has been moved the the encounter in which it belongs.  

## 2018-07-26 NOTE — Discharge Instructions (Signed)
Go straight to [facility] admitting.  Do not eat or drink.  You will have surgery this afternoon by Dr. Lenon Curt

## 2018-07-26 NOTE — Consult Note (Signed)
Reason for Consult:Left hand injury Referring Physician: Daxton [name] is an 48 y.o. male.  HPI: [name] was working this morning and got his hand caught in-between the tooth of an excavator and a bucket. He suffered a large laceration to the back of his left hand and came to the ED for evaluation. He is RHD.  Past Medical History:  Diagnosis Date  . GERD (gastroesophageal reflux disease)   . Hepatic cyst    Benign by MRI  . Hiatal hernia   . History of colonic polyps   . Hypertension   . Rectal bleeding     Past Surgical History:  Procedure Laterality Date  . COLONOSCOPY  01/10/2009     RMR: Normal rectum, normal colon; repeat in 2015 due to [place] of colon cancer  . COLONOSCOPY N/A 01/09/2014   [id number] colonic polyps-removed as described  . COLONOSCOPY N/A 09/18/2016   Procedure: COLONOSCOPY;  Surgeon: Daneil Dolin, MD;  [facility];  Service: Endoscopy;  Laterality: N/A;  730   . ESOPHAGOGASTRODUODENOSCOPY  10/04/2007   RMR: Distal esophageal erosions consistent with erosive reflux esophagitis, patulous gastroesophageal junction status post passage of a  Maloney dilator, 68 [place].  Otherwise, unremarkable esophagus.  Hiatal hernia.  Otherwise normal stomach.  Bulbar erosion  . ESOPHAGOGASTRODUODENOSCOPY N/A 01/09/2014   Erosive reflux esophagitis. Small hiatal hernia    Family History  Problem Relation Age of Onset  . Colon cancer Sister        passed away from colon cancer, in her 52s  . Diabetes Father   . Diabetes Other     Social History:  reports that he has been smoking cigarettes. He has a 2.50 pack-year smoking history. He has never used smokeless tobacco. He reports current alcohol use. He reports that he does not use drugs.  Allergies: No Known Allergies  Medications: I have reviewed the patient's current medications.  No results found for this or any previous visit (from the past 48 hour(s)).  Dg Hand Complete Left  Result Date:  07/26/2018 CLINICAL DATA:  Crush injury to right hand at work with dorsal pain. EXAM: LEFT HAND - COMPLETE 3+ VIEW COMPARISON:  None. FINDINGS: Exam demonstrates a displaced transverse fracture through the mid-diaphysis of the second metacarpal. There is a transverse fracture without significant displacement to the mid diaphysis of the third metacarpal. Minimal degenerative change over the second and third MCP joints. Degenerative change over the first interphalangeal joint. IMPRESSION: Transverse fractures through the mid-diaphysis of the second and third metacarpals. Moderate displacement of the second metacarpal fracture. Electronically Signed   By: Marin Olp M.D.   On: 07/26/2018 10:51    Review of Systems  Constitutional: Negative for weight loss.  HENT: Negative for ear discharge, ear pain, hearing loss and tinnitus.   Eyes: Negative for blurred vision, double vision, photophobia and pain.  Respiratory: Negative for cough, sputum production and shortness of breath.   Cardiovascular: Negative for chest pain.  Gastrointestinal: Negative for abdominal pain, nausea and vomiting.  Genitourinary: Negative for dysuria, flank pain, frequency and urgency.  Musculoskeletal: Positive for joint pain (Left hand). Negative for back pain, falls, myalgias and neck pain.  Neurological: Negative for dizziness, tingling, sensory change, focal weakness, loss of consciousness and headaches.  Endo/Heme/Allergies: Does not bruise/bleed easily.  Psychiatric/Behavioral: Negative for depression, memory loss and substance abuse. The patient is not nervous/anxious.    Blood pressure (!) 160/98, pulse 62, temperature 98.4 F (36.9 C), temperature source  Oral, resp. rate 19, height 5\' 9"  (1.753 m), weight 104.3 kg, SpO2 97 %. Physical Exam  Constitutional: He appears well-developed and well-nourished. No distress.  HENT:  Head: Normocephalic and atraumatic.  Eyes: Conjunctivae are normal. Right eye exhibits no  discharge. Left eye exhibits no discharge. No scleral icterus.  Neck: Normal range of motion.  Cardiovascular: Normal rate and regular rhythm.  Respiratory: Effort normal. No respiratory distress.  Musculoskeletal:     Comments: Left shoulder, elbow, wrist, digits- Curvilinear dorsal hand laceration, exposed tendons, mod TTP, no instability, no blocks to motion  Sens  Ax/R/M/U intact  Mot   Ax/ R/ PIN/ M/ AIN/ U intact (could not abduct index/long fingers, ? Pain vs neurologic)  Rad 2+  Neurological: He is alert.  Skin: Skin is warm and dry. He is not diaphoretic.  Psychiatric: He has a normal mood and affect. His behavior is normal.    Assessment/Plan: Left hand laceration/crush with open 2,3 MC fxs -- Will send to [facility] for I&D, likely pinning of 2nd MC, possible 3rd by Dr. Lenon Curt. NPO until then.     Lisette Abu, PA-C Orthopedic Surgery [phone] 07/26/2018, 11:48 AM

## 2018-07-26 NOTE — ED Triage Notes (Signed)
Pt got left hand caught in strap when trying to left equipment. Pt has laceration to hand

## 2018-07-26 NOTE — Anesthesia Procedure Notes (Signed)
Procedure Name: MAC Date/Time: 07/26/2018 5:32 PM Performed by: Barrington Ellison, CRNA Pre-anesthesia Checklist: Patient identified, Emergency Drugs available, Suction available, Patient being monitored and Timeout performed Patient Re-evaluated:Patient Re-evaluated prior to induction Oxygen Delivery Method: Simple face mask

## 2018-07-26 NOTE — ED Notes (Signed)
Radiology called and notified that left hand x-ray needed to be done ASAP.

## 2018-07-26 NOTE — Anesthesia Postprocedure Evaluation (Signed)
Anesthesia Post Note  Patient: [name]  Procedure(s) Performed: IRRIGATION AND DEBRIDEMENT EXTREMITY (Left ) PERCUTANEOUS PINNING EXTREMITY (Left )     Patient location during evaluation: PACU Anesthesia Type: General and Regional Level of consciousness: awake and alert Pain management: pain level controlled Vital Signs Assessment: post-procedure vital signs reviewed and stable Respiratory status: spontaneous breathing, nonlabored ventilation, respiratory function stable and patient connected to nasal cannula oxygen Cardiovascular status: stable and blood pressure returned to baseline Postop Assessment: no apparent nausea or vomiting Anesthetic complications: no    Last Vitals:  Vitals:   07/26/18 1903 07/26/18 1918  BP: (!) 148/92 (!) 147/90  Pulse: 78 77  Resp: 17 16  Temp:  (!) 36.4 C  SpO2: 100% 100%    Last Pain:  Vitals:   07/26/18 1918  TempSrc:   PainSc: 0-No pain                 Alexys Lobello COKER

## 2018-07-27 ENCOUNTER — Encounter (HOSPITAL_COMMUNITY): Payer: Self-pay | Admitting: General Surgery

## 2018-07-27 NOTE — Op Note (Signed)
NAME: [name], DONATELLI MEDICAL RECORD XN:[id number] ACCOUNT [id number] DATE OF BIRTH:11-09-1970 FACILITY: [facility] LOCATION: [place], [place]  OPERATIVE REPORT  DATE OF PROCEDURE:  07/26/2018  PREOPERATIVE DIAGNOSIS:  Crush injury to the left hand with open fractures of the left second and third metacarpals, associated lacerations on the dorsal aspect of the hand.  POSTOPERATIVE DIAGNOSIS:  Crush injury to the left hand with open fractures of the left second and third metacarpals, associated lacerations on the dorsal aspect of the hand.  PROCEDURE:  1.  Exploration of wound, dorsal left hand. 2.  Open reduction internal fixation of the left 2nd and 3rd metacarpal fractures with intramedullary rods 1.6 mm in size. 3.  Repair of dorsal hand laceration measuring approximately 5 cm.  ANESTHESIA:  Axillary block with sedation.  SPECIMENS:  None.  COMPLICATIONS:  No acute complications.  INDICATIONS:  The patient is a 48 year old gentleman who got his hand caught in the escalator type machine while he was working on it this afternoon, sustaining an open wound and open fractures to his left hand.  Risks, benefits and alternatives of  surgical fixation were discussed with him.  He agreed with this course of action.  Consent was obtained.  DESCRIPTION OF PROCEDURE:  The patient was taken to the operating room after anesthesia had performed an upper extremity block.  He was placed supine on the operating room table.  IV anesthesia was administered.  The left upper extremity was prepped and  draped in normal sterile fashion.  A tourniquet was used on the upper arm.  The arm was exsanguinated and the tourniquet was inflated to 250 mmHg.  Initially, the wound was explored.  The wound was opened both proximally and distally somewhat to gain  adequate exposure.  All extensor tendons to the index, long, thumb, ring and small fingers were explored and intact.  There was quite a bit  of associated soft tissue injury surrounding specifically the second metacarpal fracture.  The fracture was  completely displaced, the proximal and distal portions.  The fracture to the third metacarpal was transverse, midshaft with minimal displacement.  Next a 1.6 mm K-wire or metacarpal intramedullary nail was chosen.  The base of the metacarpal was located.   An awl was used to drill into the cortex and the K-wire was driven from it an antegrade fashion through the fracture site.  The distal fracture fragment was then reduced.  An intramedullary was passed through the canal to the end of the metacarpal  head.  Compression of the fracture site was performed.  X-rays revealed near anatomic reduction with minimal gap at the fracture site.  A similar procedure was done for the third metacarpal.  The awl was used to gain access to the medullary canal.  The  1.6 mm K-wire was driven to the metacarpal head and not to fixate the fracture.  These pins were bent and then cut just exiting the skin.  They were not locked.  Afterwards, the wound was thoroughly irrigated with saline solution.  The soft tissues were  approximated and the skin was closed with interrupted 3-0 nylon.  Small incisions were made for exit in a separate skin incision and a sterile dressing and splint were placed.    The patient tolerated the procedure well.  AN/NUANCE  D:07/26/2018 T:07/27/2018 JOB:[id number]

## 2018-09-10 ENCOUNTER — Ambulatory Visit (HOSPITAL_COMMUNITY)
Admission: EM | Admit: 2018-09-10 | Discharge: 2018-09-10 | Disposition: A | Discharge status: Home / Self Care | Payer: Self-pay | Attending: Family Medicine | Admitting: Family Medicine

## 2018-09-10 ENCOUNTER — Encounter (HOSPITAL_COMMUNITY): Payer: Self-pay | Admitting: Emergency Medicine

## 2018-09-10 DIAGNOSIS — I10 Essential (primary) hypertension: Secondary | ICD-10-CM

## 2018-09-10 MED ORDER — LISINOPRIL 10 MG PO TABS: 10.0000 mg | ORAL_TABLET | Freq: Every day | ORAL | 1 refills | Status: DC

## 2018-09-10 NOTE — ED Triage Notes (Signed)
Pt states he had a procedure done yesterday with routine vitals which showed his BP in 200 range. Pt states he used to be on BP meds (lisinopril 10mg ) but hasn't been on them for years.

## 2018-09-10 NOTE — ED Provider Notes (Signed)
[place]    CSN: [id number] Arrival date & time: 09/10/18  1324     History   Chief Complaint Chief Complaint  Patient presents with  . Hypertension    HPI [name] is a 48 y.o. male.   HPI  Presents today with a concern for uncontrolled hypertension. Patient underwent a routine procedure on 09/09/18 and his blood pressure reading prior to procedure exceeded 200/100. He was diagnosed and treated for hypertension several years back was prescribed lisinopril 10 mg. He has attempted lifestyle changes and stopped medication. He was not monitoring blood pressure during the time that he has been without medication. He endorses an occasional mild headache. Otherwise he has remained asymptomatic of chest pain, shortness of breath or lower extremity edema.  He is requesting a refill of lisinopril. Past Medical History:  Diagnosis Date  . GERD (gastroesophageal reflux disease)   . Hepatic cyst    Benign by MRI  . Hiatal hernia   . History of colonic polyps   . Hypertension   . Rectal bleeding     Patient Active Problem List   Diagnosis Date Noted  . Lesion of liver 09/23/2017  . Family history of colon cancer 05/01/2016  . Chest pain 05/01/2016  . Abnormal ultrasound 07/19/2015  . Erosive esophagitis 04/11/2014  . Hx of adenomatous colonic polyps 04/11/2014  . Abdominal pain 06/01/2012  . Rectal bleeding 06/01/2012  . Constipation 06/01/2012  . ALCOHOL ABUSE 09/06/2008  . TOBACCO ABUSE 09/06/2008  . HYPERTENSION 09/06/2008  . GASTROESOPHAGEAL REFLUX DISEASE, CHRONIC 09/06/2008  . CONSTIPATION, CHRONIC 09/06/2008  . DYSPHAGIA UNSPECIFIED 09/06/2008    Past Surgical History:  Procedure Laterality Date  . COLONOSCOPY  01/10/2009     RMR: Normal rectum, normal colon; repeat in 2015 due to [place] of colon cancer  . COLONOSCOPY N/A 01/09/2014   [id number] colonic polyps-removed as described  . COLONOSCOPY N/A 09/18/2016   Procedure: COLONOSCOPY;  Surgeon:  Daneil Dolin, MD;  [facility];  Service: Endoscopy;  Laterality: N/A;  730   . ESOPHAGOGASTRODUODENOSCOPY  10/04/2007   RMR: Distal esophageal erosions consistent with erosive reflux esophagitis, patulous gastroesophageal junction status post passage of a  Maloney dilator, [address] [place].  Otherwise, unremarkable esophagus.  Hiatal hernia.  Otherwise normal stomach.  Bulbar erosion  . ESOPHAGOGASTRODUODENOSCOPY N/A 01/09/2014   Erosive reflux esophagitis. Small hiatal hernia  . I&D EXTREMITY Left 07/26/2018   Procedure: IRRIGATION AND DEBRIDEMENT EXTREMITY;  Surgeon: Dayna Barker, MD;  Location: [place];  Service: Plastics;  Laterality: Left;  . PERCUTANEOUS PINNING Left 07/26/2018   Procedure: PERCUTANEOUS PINNING EXTREMITY;  Surgeon: Dayna Barker, MD;  Location: [place];  Service: Plastics;  Laterality: Left;       Home Medications    Prior to Admission medications   Medication Sig Start Date End Date Taking? Authorizing Provider  calcium carbonate (TUMS EX) 750 MG chewable tablet Chew 2 tablets by mouth as needed for heartburn.    [provider]  cephALEXin (KEFLEX) 500 MG capsule Take 1 capsule (500 mg total) by mouth 4 (four) times daily. Patient not taking: Reported on 09/10/2018 07/26/18   Dayna Barker, MD  famotidine (ACID CONTROL MAXIMUM STRENGTH) 20 MG tablet Take 20 mg by mouth as needed for heartburn or indigestion.    [provider]  HYDROcodone-acetaminophen (NORCO) 7.5-325 MG tablet Take 1 tablet by mouth every 6 (six) hours as needed for moderate pain. Patient not taking: Reported on 09/10/2018 07/26/18  Dayna Barker, MD  polyethylene glycol (MIRALAX / GLYCOLAX) packet Take 17 g by mouth daily. Patient taking differently: Take 17 g by mouth daily as needed for mild constipation.  11/19/15   Charlesetta Shanks, MD  polyvinyl alcohol (LIQUIFILM TEARS) 1.4 % ophthalmic solution Place 1 drop into both eyes as needed for dry eyes.    [provider]   saccharomyces boulardii (FLORASTOR) 250 MG capsule Take 250 mg by mouth daily as needed (bowel movements).    [provider]  sodium-potassium bicarbonate (ALKA-SELTZER GOLD) TBEF dissolvable tablet Take 1 tablet by mouth daily as needed (cold symptoms).    [provider]    Family History Family History  Problem Relation Age of Onset  . Colon cancer Sister        passed away from colon cancer, in her 4s  . Diabetes Father   . Diabetes Other     Social History Social History   Tobacco Use  . Smoking status: Current Every Day Smoker    Packs/day: 0.25    Years: 10.00    Pack years: 2.50    Types: Cigarettes  . Smokeless tobacco: Never Used  Substance Use Topics  . Alcohol use: Yes    Alcohol/week: 0.0 standard drinks    Comment: occasional/wine on the weekends  . Drug use: No     Allergies   Patient has no known allergies.   Review of Systems Review of Systems  Pertinent negatives listed in HPI Physical Exam Triage Vital Signs ED Triage Vitals [09/10/18 1337]  Enc Vitals Group     BP (!) 171/123     Pulse Rate 80     Resp 16     Temp 99 F (37.2 C)     Temp src      SpO2 100 %     Weight      Height      Head Circumference      Peak Flow      Pain Score 0     Pain Loc      Pain Edu?      Excl. in [place]?    No data found.  Updated Vital Signs BP (!) 171/123   Pulse 80   Temp 99 F (37.2 C)   Resp 16   SpO2 100%   Visual Acuity Right Eye Distance:   Left Eye Distance:   Bilateral Distance:    Right Eye Near:   Left Eye Near:    Bilateral Near:     Physical Exam General appearance: alert, well developed, well nourished, cooperative and in no distress Head: Normocephalic, without obvious abnormality, atraumatic Respiratory: Respirations even and unlabored, normal respiratory rate Heart: rate and rhythm normal. No gallop or murmurs noted on exam  Abdomen: BS +, no distention, no rebound tenderness, or no mass Extremities:  No gross deformities Skin: Skin color, texture, turgor normal. No rashes seen  Psych: Appropriate mood and affect. Neurologic: Mental status: Alert, oriented to person, place, and time, thought content appropriate.  UC Treatments / Results  Labs (all labs ordered are listed, but only abnormal results are displayed) Labs Reviewed - No data to display  EKG None  Radiology No results found.  Procedures Procedures (including critical care time)  Medications Ordered in UC Medications - No data to display  Initial Impression / Assessment and Plan / UC Course  I have reviewed the triage vital signs and the nursing notes.  Pertinent labs & imaging results that were  available during my care of the patient were reviewed by me and considered in my medical decision making (see chart for details).   Refilled Lisinopril 10 mg. Patient given contact information to follow-up with me at [facility] to establish care as a new patient for further work-up and management of hypertension. Red flags discussed. Patient verbalized understanding and agreement with plan. Final Clinical Impressions(s) / UC Diagnoses   Final diagnoses:  Essential hypertension   Discharge Instructions   None    ED Prescriptions    Medication Sig Dispense Auth. Provider   lisinopril (ZESTRIL) 10 MG tablet Take 1 tablet (10 mg total) by mouth daily. 30 tablet Scot Jun, FNP     Controlled Substance Prescriptions [place] Controlled Substance Registry consulted? Not Applicable   Scot Jun, FNP 09/10/18 1430

## 2018-09-20 NOTE — Progress Notes (Signed)
REVIEWED-NO ADDITIONAL RECOMMENDATIONS. 

## 2018-09-22 ENCOUNTER — Ambulatory Visit: Payer: Self-pay | Admitting: Nurse Practitioner

## 2018-09-22 ENCOUNTER — Other Ambulatory Visit: Payer: Self-pay

## 2018-09-22 ENCOUNTER — Telehealth: Payer: Self-pay | Admitting: Nurse Practitioner

## 2018-09-22 NOTE — Telephone Encounter (Signed)
Nonproductive doctor-patient relationship. Discharge. 

## 2018-09-22 NOTE — Telephone Encounter (Signed)
Per discussion with CM, phone message sent to RMR for approval of d/c letter.  Patient last TCS didn't follow instructions for prep ("ate chicken") Rescheduled TCS cancelled and rescheduled Another attempt TCS was a now show  Additionally has been a no-show for at least 2 or 3 office visits and has not followed through with MRI recall for liver lesion.

## 2018-09-22 NOTE — Progress Notes (Signed)
Patient no show or cancelled 

## 2018-09-23 ENCOUNTER — Encounter: Payer: Self-pay | Admitting: General Practice

## 2018-09-23 NOTE — Telephone Encounter (Signed)
Discharge letter mailed  

## 2018-11-03 ENCOUNTER — Telehealth: Payer: Self-pay

## 2018-11-03 NOTE — Telephone Encounter (Signed)
Certified discharge letter was returned unclaimed. Re-mailed letter via [employer]. Made copy of envelope and sent to be scanned.

## 2018-11-16 ENCOUNTER — Telehealth: Payer: Self-pay

## 2018-11-16 NOTE — Telephone Encounter (Signed)

## 2018-11-17 ENCOUNTER — Other Ambulatory Visit: Payer: Self-pay

## 2018-11-17 ENCOUNTER — Ambulatory Visit (INDEPENDENT_AMBULATORY_CARE_PROVIDER_SITE_OTHER): Payer: No Typology Code available for payment source | Admitting: Family Medicine

## 2018-11-17 ENCOUNTER — Encounter: Payer: Self-pay | Admitting: Family Medicine

## 2018-11-17 VITALS — BP 164/109 | HR 99 | Temp 97.6°F | Resp 17 | Ht 68.0 in | Wt 230.8 lb

## 2018-11-17 DIAGNOSIS — Z1389 Encounter for screening for other disorder: Secondary | ICD-10-CM

## 2018-11-17 DIAGNOSIS — Z125 Encounter for screening for malignant neoplasm of prostate: Secondary | ICD-10-CM

## 2018-11-17 DIAGNOSIS — Z1329 Encounter for screening for other suspected endocrine disorder: Secondary | ICD-10-CM

## 2018-11-17 DIAGNOSIS — Z131 Encounter for screening for diabetes mellitus: Secondary | ICD-10-CM | POA: Diagnosis not present

## 2018-11-17 DIAGNOSIS — I10 Essential (primary) hypertension: Secondary | ICD-10-CM | POA: Diagnosis not present

## 2018-11-17 DIAGNOSIS — E785 Hyperlipidemia, unspecified: Secondary | ICD-10-CM | POA: Diagnosis not present

## 2018-11-17 LAB — POCT URINALYSIS DIP (CLINITEK)
Bilirubin, UA: NEGATIVE
Glucose, UA: NEGATIVE mg/dL
Ketones, POC UA: NEGATIVE mg/dL
Leukocytes, UA: NEGATIVE
Nitrite, UA: NEGATIVE
POC PROTEIN,UA: 30 — AB
Spec Grav, UA: >=1.03 — AB (ref 1.010–1.025)
Urobilinogen, UA: 1 E.U./dL
pH, UA: 6 (ref 5.0–8.0)

## 2018-11-17 MED ORDER — LISINOPRIL 20 MG PO TABS: 20.0000 mg | ORAL_TABLET | Freq: Every day | ORAL | 3 refills | Status: DC

## 2018-11-17 NOTE — Progress Notes (Signed)
[name], is a 48 y.o. male  PPI:[id number]  AYT:[id number]  DOB - May 12, 1971  CC:  Chief Complaint  Patient presents with  . Establish Care  . Hypertension       HPI: [name] is a 48 y.o. male is here today to establish care and hypertension management.  [name] has ALCOHOL ABUSE; TOBACCO ABUSE; HYPERTENSION; GASTROESOPHAGEAL REFLUX DISEASE, CHRONIC; CONSTIPATION, CHRONIC; DYSPHAGIA UNSPECIFIED; Abdominal pain; Rectal bleeding; Constipation; Erosive esophagitis; Hx of adenomatous colonic polyps; Abnormal ultrasound; Family history of colon cancer; Chest pain; and Lesion of liver on their problem list.    [name] has a history of hypertension and has been off medication for quite some time.  He does not monitor blood pressure at home.  He was scheduled to have a surgery however the surgery could not be performed due to persistently elevated blood pressure readings. He presented to urgent care on 09/10/2018 and blood pressure was 171/123.  He was previously treated with lisinopril 10 mg and this was refilled at urgent care visit.  He has been without medication for about a month and a half.  He remains hypertensive today.  He is a current everyday smoker.  He is an active of routine physical activity.  His current Body mass index is 35.09 kg/m. Patient denies new headaches, chest pain, abdominal pain, nausea, new weakness , numbness or tingling, SOB, edema, or worrisome cough.  In review of EMR patient was previously diagnosed with mixed hyperlipidemia.  He has not had a lipid panel recently.  He is unaware if he was previously on medication for cholesterol.  His family history is significant for heart disease.  Current medications: Current Outpatient Medications:  .  calcium carbonate (TUMS EX) 750 MG chewable tablet, Chew 2 tablets by mouth as needed for heartburn., Disp: , Rfl:  .  lisinopril (ZESTRIL) 10 MG tablet, Take 1 tablet (10 mg total) by mouth daily., Disp: 30 tablet, Rfl:  1   Pertinent family medical history: family history includes Colon cancer in his sister; Diabetes in his father and another family member; Heart attack in his maternal uncle; Heart disease in his mother; Hypertension in his father and mother.   No Known Allergies  Social History   Socioeconomic History  . Marital status: Single    Spouse name: Not on file  . Number of children: Not on file  . Years of education: Not on file  . Highest education level: Not on file  Occupational History  . Occupation: Scientist, physiological: [employer]    Comment: IT sales professional in [place]  . Financial resource strain: Not on file  . Food insecurity    Worry: Not on file    Inability: Not on file  . Transportation needs    Medical: Not on file    Non-medical: Not on file  Tobacco Use  . Smoking status: Current Every Day Smoker    Packs/day: 0.25    Years: 10.00    Pack years: 2.50    Types: Cigarettes  . Smokeless tobacco: Never Used  Substance and Sexual Activity  . Alcohol use: Yes    Alcohol/week: 0.0 standard drinks    Comment: occasional/wine on the weekends  . Drug use: No  . Sexual activity: Not on file  Lifestyle  . Physical activity    Days per week: Not on file    Minutes per session: Not on file  . Stress: Not on file  Relationships  .  Social Herbalist on phone: Not on file    Gets together: Not on file    Attends religious service: Not on file    Active member of club or organization: Not on file    Attends meetings of clubs or organizations: Not on file    Relationship status: Not on file  . Intimate partner violence    Fear of current or ex partner: Not on file    Emotionally abused: Not on file    Physically abused: Not on file    Forced sexual activity: Not on file  Other Topics Concern  . Not on file  Social History Narrative  . Not on file    Review of Systems: Pertinent negatives listed in HPI  Objective:    Vitals:   11/17/18 1613  BP: (!) 164/109  Pulse: 99  Resp: 17  Temp: 97.6 F (36.4 C)  SpO2: 95%    BP Readings from Last 3 Encounters:  09/10/18 (!) 171/123  07/26/18 (!) 147/90  07/26/18 (!) 145/93    There were no vitals filed for this visit.    Physical Exam: Constitutional: Patient appears well-developed and well-nourished. No distress. HENT: Normocephalic, atraumatic, External right and left ear normal.  Oropharynx is clear and moist.  Eyes: Conjunctivae and EOM are normal. PERRLA, no scleral icterus. Neck: Normal ROM. Neck supple. No JVD. No tracheal deviation. No thyromegaly. CVS: RRR, S1/S2 +, no murmurs, no gallops, no carotid bruit.  Pulmonary: Effort and breath sounds normal, no stridor, rhonchi, wheezes, rales.  Abdominal: Soft. BS +, no distension, tenderness, rebound or guarding.  Musculoskeletal: Normal range of motion. No edema and no tenderness.  Neuro: Alert. Normal muscle tone coordination. Normal gait. BUE and BLE strength 5/5.  Skin: Skin is warm and dry. No rash noted. Not diaphoretic. No erythema. No pallor. Psychiatric: Normal mood and affect. Behavior, judgment, thought content normal.  Lab Results (prior encounters)  Lab Results  Component Value Date   WBC 10.6 (H) 07/26/2018   HGB 15.0 07/26/2018   HCT 48.9 07/26/2018   MCV 89.6 07/26/2018   PLT 259 07/26/2018   Lab Results  Component Value Date   CREATININE 1.13 07/26/2018   BUN 13 07/26/2018   NA 136 07/26/2018   K 4.2 07/26/2018   CL 103 07/26/2018   CO2 24 07/26/2018    Lab Results  Component Value Date   HGBA1C  09/17/2007    5.7 (NOTE)   The ADA recommends the following therapeutic goals for glycemic   control related to Hgb A1C measurement:   Goal of Therapy:   < 7.0% Hgb A1C   Action Suggested:  > 8.0% Hgb A1C   Ref:  Diabetes Care, 22, Suppl. 1, 1999       Component Value Date/Time   CHOL (H) 06/23/2008 0810    220        ATP III CLASSIFICATION:  <200     mg/dL    Desirable  200-239  mg/dL   Borderline High  >=240    mg/dL   High          TRIG 155 (H) 06/23/2008 0810   HDL 26 (L) 06/23/2008 0810   CHOLHDL 8.5 06/23/2008 0810   VLDL 31 06/23/2008 0810   LDLCALC (H) 06/23/2008 0810    163        Total Cholesterol/HDL:CHD Risk Coronary Heart Disease Risk Table  Men   Women  1/2 Average Risk   3.4   3.3  Average Risk       5.0   4.4  2 X Average Risk   9.6   7.1  3 X Average Risk  23.4   11.0        Use the calculated Patient Ratio above and the CHD Risk Table to determine the patient's CHD Risk.        ATP III CLASSIFICATION (LDL):  <100     mg/dL   Optimal  100-129  mg/dL   Near or Above                    Optimal  130-159  mg/dL   Borderline  160-189  mg/dL   High  >190     mg/dL   Very High        Assessment and plan:  1. Essential hypertension, uncontrolled today We have discussed target BP range and blood pressure goal. I have advised patient to check BP regularly and to call us back or report to clinic if the numbers are consistently higher than 140/90. We discussed the importance of compliance with medical therapy and DASH diet recommended, consequences of uncontrolled hypertension discussed.  -Increase to lisinopril 20 mg once daily.  We will continue to titrate medication for blood pressure less than 140/90. - CBC with Differential  2. Screening PSA (prostate specific antigen) - PSA  3. Screening for diabetes mellitus - Comprehensive metabolic panel - Hemoglobin A1c  4. Screening for thyroid disorder - TSH  5. Hyperlipidemia, unspecified hyperlipidemia type Patient has a history mixed hyperlipidemia dating back to 66.  Given that he is a current daily smoker, has hypertension, and is overweight.  These factors increase his risk greatly of suffering from a cardiovascular event.  Will start him on aggressive statin therapy with atorvastatin 40 mg once daily.  Will obtain a fasting lipid panel today. -  Lipid panel, fasting 6. Screening for blood or protein in urine - POCT URINALYSIS DIP (CLINITEK)  Return for 1 week BP check and 3 months provider  Hypertension and Hyperlipidemia follow-up.   The patient was given clear instructions to go to ER or return to medical center if symptoms don't improve, worsen or new problems develop. The patient verbalized understanding. The patient was advised  to call and obtain lab results if they haven't heard anything from out office within 7-10 business days.  Christopher Barrows, FNP Primary Care at [facility] [address], [place] [address] [phone]: [phone]    This note has been created with Dragon speech recognition software and Engineer, materials. Any transcriptional errors are unintentional.

## 2018-11-17 NOTE — Patient Instructions (Signed)
Thank you for choosing Primary Care at [facility] for your medical home!    [name] was seen by Molli Barrows, FNP today.   [name]'s primary care doctor is Scot Jun, FNP.   For the best care possible,  you should try to see Molli Barrows, FNP-C  whenever you come to clinic.   We look forward to seeing you again soon!  If you have any questions about your visit today,  please call us at [phone]  Or feel free to reach your provider via [place].        Coronary Artery Disease, Male  Coronary artery disease (CAD) is a condition in which the arteries that lead to the heart (coronary arteries) become narrow or blocked. The narrowing or blockage can lead to decreased blood flow to the heart. Prolonged reduced blood flow can cause a heart attack (myocardial infarction or MI). This condition may also be called coronary heart disease. Because CAD is the leading cause of death in men, it is important to understand what causes this condition and how it is treated. What are the causes? CAD is most often caused by atherosclerosis. This is the buildup of fat and cholesterol (plaque) on the inside of the arteries. Over time, the plaque may narrow or block the artery, reducing blood flow to the heart. Plaque can also become weak and break off within a coronary artery and cause a sudden blockage. Other less common causes of CAD include:  An embolism or blood clot in a coronary artery.  A tearing of the artery (spontaneous coronary artery dissection).  An aneurysm.  Inflammation (vasculitis) in the artery wall. What increases the risk? The following factors may make you more likely to develop this condition:  Age. Men over age 65 are at a greater risk of CAD.  Family history of CAD.  Gender. Men often develop CAD earlier in life than women.  High blood pressure (hypertension).  Diabetes.  High cholesterol levels.  Tobacco use.  Excessive alcohol  use.  Lack of exercise.  A diet high in saturated and trans fats, such as fried food and processed meat. Other possible risk factors include:  High stress levels.  Depression.  Obesity.  Sleep apnea. What are the signs or symptoms? Many people do not have any symptoms during the early stages of CAD. As the condition progresses, symptoms may include:  Chest pain (angina). The pain can: ? Feel like a crushing or squeezing, or a tightness, pressure, fullness, or heaviness in the chest. ? Last more than a few minutes or can stop and recur. The pain tends to get worse with exercise or stress and to fade with rest.  Pain in the arms, neck, jaw, or back.  Unexplained heartburn or indigestion.  Shortness of breath.  Nausea or vomiting.  Sudden light-headedness.  Sudden cold sweats.  Fluttering or fast heartbeat (palpitations). How is this diagnosed? This condition is diagnosed based on:  Your family and medical history.  A physical exam.  Tests, including: ? A test to check the electrical signals in your heart (electrocardiogram). ? Exercise stress test. This looks for signs of blockage when the heart is stressed with exercise, such as running on a treadmill. ? Pharmacologic stress test. This test looks for signs of blockage when the heart is being stressed with a medicine. ? Blood tests. ? Coronary angiogram. This is a procedure to look at the coronary arteries to see if there is any blockage. During  this test, a dye is injected into your arteries so they appear on an X-ray. ? A test that uses sound waves to take a picture of your heart (echocardiogram). ? Chest X-ray. How is this treated? This condition may be treated by:  Healthy lifestyle changes to reduce risk factors.  Medicines such as: ? Antiplatelet medicines and blood-thinning medicines, such as aspirin. These help to prevent blood clots. ? Nitroglycerin. ? Blood pressure medicines. ? Cholesterol-lowering  medicine.  Coronary angioplasty and stenting. During this procedure, a thin, flexible tube is inserted through a blood vessel and into a blocked artery. A balloon or similar device on the end of the tube is inflated to open up the artery. In some cases, a small, mesh tube (stent) is inserted into the artery to keep it open.  Coronary artery bypass surgery. During this surgery, veins or arteries from other parts of the body are used to create a bypass around the blockage and allow blood to reach your heart. Follow these instructions at home: Medicines  Take over-the-counter and prescription medicines only as told by your health care provider.  Do not take the following medicines unless your health care provider approves: ? NSAIDs, such as ibuprofen, naproxen, or celecoxib. ? Vitamin supplements that contain vitamin A, vitamin E, or both. Lifestyle  Follow an exercise program approved by your health care provider. Aim for 150 minutes of moderate exercise or 75 minutes of vigorous exercise each week.  Maintain a healthy weight or lose weight as approved by your health care provider.  Rest when you are tired.  Learn to manage stress or try to limit your stress. Ask your health care provider for suggestions if you need help.  Get screened for depression and seek treatment, if needed.  Do not use any products that contain nicotine or tobacco, such as cigarettes and e-cigarettes. If you need help quitting, ask your health care provider.  Do not use illegal drugs. Eating and drinking  Follow a heart-healthy diet. A dietitian can help educate you about healthy food options and changes. In general, eat plenty of fruits and vegetables, lean meats, and whole grains.  Avoid foods high in: ? Sugar. ? Salt (sodium). ? Saturated fat, such as processed or fatty meat. ? Trans fat, such as fried foods.  Use healthy cooking methods such as roasting, grilling, broiling, baking, poaching, steaming,  or stir-frying.  If you drink alcohol, and your health care provider approves, limit your alcohol intake to no more than 2 drinks per day. One drink equals 12 ounces of beer, 5 ounces of wine, or 1 ounces of hard liquor. General instructions  Manage any other health conditions, such as hypertension and diabetes. These conditions affect your heart.  Your health care provider may ask you to monitor your blood pressure. Ideally, your blood pressure should be below 130/80.  Keep all follow-up visits as told by your health care provider. This is important. Get help right away if:  You have pain in your chest, neck, arm, jaw, stomach, or back that: ? Lasts more than a few minutes. ? Is recurring. ? Is not relieved by taking medicine under your tongue (sublingualnitroglycerin).  You have too much (profuse) sweating without cause.  You have unexplained: ? Heartburn or indigestion. ? Shortness of breath or difficulty breathing. ? Fluttering or fast heartbeat (palpitations). ? Nausea or vomiting. ? Fatigue. ? Feelings of nervousness or anxiety. ? Weakness. ? Diarrhea.  You have sudden light-headedness or dizziness.  You faint.  You feel like hurting yourself or think about taking your own life. These symptoms may represent a serious problem that is an emergency. Do not wait to see if the symptoms will go away. Get medical help right away. Call your local emergency services (911 in the [place] Do not drive yourself to the hospital. Summary  Coronary artery disease (CAD) is a process in which the arteries that lead to the heart (coronary arteries) become narrow or blocked. The narrowing or blockage can lead to a heart attack.  Many people do not have any symptoms during the early stages of CAD. This is called "silent CAD."  CAD can be treated with lifestyle changes, medicines, surgery, or a combination of these treatments. This information is not intended to replace advice given to you  by your health care provider. Make sure you discuss any questions you have with your health care provider. Document Released: 12/07/2013 Document Revised: 05/02/2016 Document Reviewed: 05/02/2016 Elsevier Interactive Patient Education  2019 [employer].  Hypertension Hypertension is another name for high blood pressure. High blood pressure forces your heart to work harder to pump blood. This can cause problems over time. There are two numbers in a blood pressure reading. There is a top number (systolic) over a bottom number (diastolic). It is best to have a blood pressure below 120/80. Healthy choices can help lower your blood pressure. You may need medicine to help lower your blood pressure if:  Your blood pressure cannot be lowered with healthy choices.  Your blood pressure is higher than 130/80. Follow these instructions at home: Eating and drinking   If directed, follow the DASH eating plan. This diet includes: ? Filling half of your plate at each meal with fruits and vegetables. ? Filling one quarter of your plate at each meal with whole grains. Whole grains include whole wheat pasta, brown rice, and whole grain bread. ? Eating or drinking low-fat dairy products, such as skim milk or low-fat yogurt. ? Filling one quarter of your plate at each meal with low-fat (lean) proteins. Low-fat proteins include fish, skinless chicken, eggs, beans, and tofu. ? Avoiding fatty meat, cured and processed meat, or chicken with skin. ? Avoiding premade or processed food.  Eat less than 1,500 mg of salt (sodium) a day.  Limit alcohol use to no more than 1 drink a day for nonpregnant women and 2 drinks a day for men. One drink equals 12 oz of beer, 5 oz of wine, or 1 oz of hard liquor. Lifestyle  Work with your doctor to stay at a healthy weight or to lose weight. Ask your doctor what the best weight is for you.  Get at least 30 minutes of exercise that causes your heart to beat faster (aerobic  exercise) most days of the week. This may include walking, swimming, or biking.  Get at least 30 minutes of exercise that strengthens your muscles (resistance exercise) at least 3 days a week. This may include lifting weights or pilates.  Do not use any products that contain nicotine or tobacco. This includes cigarettes and e-cigarettes. If you need help quitting, ask your doctor.  Check your blood pressure at home as told by your doctor.  Keep all follow-up visits as told by your doctor. This is important. Medicines  Take over-the-counter and prescription medicines only as told by your doctor. Follow directions carefully.  Do not skip doses of blood pressure medicine. The medicine does not work as well if you skip doses. Skipping doses  also puts you at risk for problems.  Ask your doctor about side effects or reactions to medicines that you should watch for. Contact a doctor if:  You think you are having a reaction to the medicine you are taking.  You have headaches that keep coming back (recurring).  You feel dizzy.  You have swelling in your ankles.  You have trouble with your vision. Get help right away if:  You get a very bad headache.  You start to feel confused.  You feel weak or numb.  You feel faint.  You get very bad pain in your: ? Chest. ? Belly (abdomen).  You throw up (vomit) more than once.  You have trouble breathing. Summary  Hypertension is another name for high blood pressure.  Making healthy choices can help lower blood pressure. If your blood pressure cannot be controlled with healthy choices, you may need to take medicine. This information is not intended to replace advice given to you by your health care provider. Make sure you discuss any questions you have with your health care provider. Document Released: 10/29/2007 Document Revised: 04/09/2016 Document Reviewed: 04/09/2016 Elsevier Interactive Patient Education  2019 [employer].

## 2018-11-18 ENCOUNTER — Other Ambulatory Visit: Payer: Self-pay | Admitting: Family Medicine

## 2018-11-18 LAB — LIPID PANEL
Chol/HDL Ratio: 4.8 ratio (ref 0.0–5.0)
Cholesterol, Total: 241 mg/dL — ABNORMAL HIGH (ref 100–199)
HDL: 50 mg/dL (ref >39)
LDL Calculated: 142 mg/dL — ABNORMAL HIGH (ref 0–99)
Triglycerides: 245 mg/dL — ABNORMAL HIGH (ref 0–149)
VLDL Cholesterol Cal: 49 mg/dL — ABNORMAL HIGH (ref 5–40)

## 2018-11-18 LAB — CBC WITH DIFFERENTIAL/PLATELET
Basophils Absolute: 0 10*3/uL (ref 0.0–0.2)
Basos: 0 %
EOS (ABSOLUTE): 0.1 10*3/uL (ref 0.0–0.4)
Eos: 1 %
Hematocrit: 48 % (ref 37.5–51.0)
Hemoglobin: 16.1 g/dL (ref 13.0–17.7)
Immature Grans (Abs): 0.1 10*3/uL (ref 0.0–0.1)
Immature Granulocytes: 1 %
Lymphocytes Absolute: 3 10*3/uL (ref 0.7–3.1)
Lymphs: 33 %
MCH: 29 pg (ref 26.6–33.0)
MCHC: 33.5 g/dL (ref 31.5–35.7)
MCV: 87 fL (ref 79–97)
Monocytes Absolute: 0.6 10*3/uL (ref 0.1–0.9)
Monocytes: 7 %
Neutrophils Absolute: 5.3 10*3/uL (ref 1.4–7.0)
Neutrophils: 58 %
Platelets: 287 10*3/uL (ref 150–450)
RBC: 5.55 x10E6/uL (ref 4.14–5.80)
RDW: 12.6 % (ref 11.6–15.4)
WBC: 9.1 10*3/uL (ref 3.4–10.8)

## 2018-11-18 LAB — COMPREHENSIVE METABOLIC PANEL
ALT: 27 IU/L (ref 0–44)
AST: 25 IU/L (ref 0–40)
Albumin/Globulin Ratio: 1.4 (ref 1.2–2.2)
Albumin: 4.5 g/dL (ref 4.0–5.0)
Alkaline Phosphatase: 86 IU/L (ref 39–117)
BUN/Creatinine Ratio: 16 (ref 9–20)
BUN: 18 mg/dL (ref 6–24)
Bilirubin Total: 0.3 mg/dL (ref 0.0–1.2)
CO2: 19 mmol/L — ABNORMAL LOW (ref 20–29)
Calcium: 9.7 mg/dL (ref 8.7–10.2)
Chloride: 104 mmol/L (ref 96–106)
Creatinine, Ser: 1.11 mg/dL (ref 0.76–1.27)
GFR calc Af Amer: 90 mL/min/{1.73_m2} (ref >59)
GFR calc non Af Amer: 78 mL/min/{1.73_m2} (ref >59)
Globulin, Total: 3.2 g/dL (ref 1.5–4.5)
Glucose: 95 mg/dL (ref 65–99)
Potassium: 4.6 mmol/L (ref 3.5–5.2)
Sodium: 140 mmol/L (ref 134–144)
Total Protein: 7.7 g/dL (ref 6.0–8.5)

## 2018-11-18 LAB — HEMOGLOBIN A1C
Est. average glucose Bld gHb Est-mCnc: 117 mg/dL
Hgb A1c MFr Bld: 5.7 % — ABNORMAL HIGH (ref 4.8–5.6)

## 2018-11-18 LAB — TSH: TSH: 1.2 u[IU]/mL (ref 0.450–4.500)

## 2018-11-18 LAB — PSA: Prostate Specific Ag, Serum: 1.7 ng/mL (ref 0.0–4.0)

## 2018-11-18 NOTE — Telephone Encounter (Signed)
Recent labs indicate poorly controlled cholesterol. Start aggressive statin therapy, atorvastatin 40 mg.  A1c 5.7 indicates prediabetes-I recommend lifestyle changes such as engaging in routine physical activity with a goal of 150 minutes per week, increasing intake of vegetables, fruits, fiber, and selecting lean cuts of meat.

## 2018-11-19 MED ORDER — ATORVASTATIN CALCIUM 40 MG PO TABS: 40.0000 mg | ORAL_TABLET | Freq: Every day | ORAL | 3 refills | Status: DC

## 2018-11-19 NOTE — Telephone Encounter (Signed)
Patient notified of lab results & recommendations. Expressed understanding. Is agreeable to starting Statin. Prescription sent to pharmacy on file.

## 2018-11-24 ENCOUNTER — Ambulatory Visit: Payer: No Typology Code available for payment source

## 2018-11-25 ENCOUNTER — Ambulatory Visit: Payer: No Typology Code available for payment source

## 2018-11-29 ENCOUNTER — Ambulatory Visit: Payer: No Typology Code available for payment source

## 2018-12-29 ENCOUNTER — Other Ambulatory Visit: Payer: Self-pay

## 2018-12-29 DIAGNOSIS — Z20822 Contact with and (suspected) exposure to covid-19: Secondary | ICD-10-CM

## 2018-12-30 LAB — NOVEL CORONAVIRUS, NAA: SARS-CoV-2, NAA: NOT DETECTED

## 2019-02-16 ENCOUNTER — Telehealth: Payer: Self-pay

## 2019-02-16 NOTE — Telephone Encounter (Signed)
Called patient to do their pre-visit COVID screening.  Call went to voicemail. Unable to do prescreening.  

## 2019-02-17 ENCOUNTER — Ambulatory Visit (INDEPENDENT_AMBULATORY_CARE_PROVIDER_SITE_OTHER): Payer: No Typology Code available for payment source | Admitting: Family Medicine

## 2019-02-17 ENCOUNTER — Other Ambulatory Visit: Payer: Self-pay

## 2019-02-17 ENCOUNTER — Encounter: Payer: Self-pay | Admitting: Family Medicine

## 2019-02-17 VITALS — BP 148/101 | HR 83 | Temp 97.3°F | Resp 17 | Ht 68.0 in | Wt 223.0 lb

## 2019-02-17 DIAGNOSIS — S6992XD Unspecified injury of left wrist, hand and finger(s), subsequent encounter: Secondary | ICD-10-CM | POA: Diagnosis not present

## 2019-02-17 DIAGNOSIS — E785 Hyperlipidemia, unspecified: Secondary | ICD-10-CM | POA: Insufficient documentation

## 2019-02-17 DIAGNOSIS — E7800 Pure hypercholesterolemia, unspecified: Secondary | ICD-10-CM

## 2019-02-17 DIAGNOSIS — Z1159 Encounter for screening for other viral diseases: Secondary | ICD-10-CM

## 2019-02-17 DIAGNOSIS — I10 Essential (primary) hypertension: Secondary | ICD-10-CM

## 2019-02-17 DIAGNOSIS — E782 Mixed hyperlipidemia: Secondary | ICD-10-CM | POA: Insufficient documentation

## 2019-02-17 NOTE — Progress Notes (Signed)
Patient is fasting.  He has taken BP medication this morning. Doesn't check BP at home. No chest pain, SHOB, headaches, palpitations, dizziness, lower extremity swelling.  States that the Atorvastatin was expensive so he didn't pick it up.  Declines flu shot.

## 2019-02-17 NOTE — Progress Notes (Signed)
Subjective:  Patient ID: [name], male    DOB: [birth year]  Age: 48 y.o. MRN: [id number]  CC: Hypertension and Hyperlipidemia   HPI [name] is a 48 year old male with a history of Hypertension, Hyperlipidemia here for a follow up visit. His BP has improved compared to his last visit but is still elevated and he attributes this to smoking a ciggarrette just before he got here. States at his other Doctor visits his BP has been controlled. Endorses compliance with Lisinopril His cholesterol is elevated and he has been non compliant with Lipitor due to cost - had to pay $30 out of pocket. He will try to use his insurance company as he discovered he has prescription drug coverage. He exercises regularly and his job is very physical.  His left hand continues to hurt and he has difficult making a fist. He was injured on the job and has undergone surgery with no much improvement; currently under workers' comp.  Past Medical History:  Diagnosis Date  . GERD (gastroesophageal reflux disease)   . Hepatic cyst    Benign by MRI  . Hiatal hernia   . History of colonic polyps   . Hypertension   . Rectal bleeding     Past Surgical History:  Procedure Laterality Date  . COLONOSCOPY  01/10/2009     RMR: Normal rectum, normal colon; repeat in 2015 due to [place] of colon cancer  . COLONOSCOPY N/A 01/09/2014   [id number] colonic polyps-removed as described  . COLONOSCOPY N/A 09/18/2016   Procedure: COLONOSCOPY;  Surgeon: Daneil Dolin, MD;  [facility];  Service: Endoscopy;  Laterality: N/A;  730   . ESOPHAGOGASTRODUODENOSCOPY  10/04/2007   RMR: Distal esophageal erosions consistent with erosive reflux esophagitis, patulous gastroesophageal junction status post passage of a  Maloney dilator, 75 [place].  Otherwise, unremarkable esophagus.  Hiatal hernia.  Otherwise normal stomach.  Bulbar erosion  . ESOPHAGOGASTRODUODENOSCOPY N/A 01/09/2014   Erosive reflux esophagitis. Small  hiatal hernia  . I&D EXTREMITY Left 07/26/2018   Procedure: IRRIGATION AND DEBRIDEMENT EXTREMITY;  Surgeon: Dayna Barker, MD;  Location: [place];  Service: Plastics;  Laterality: Left;  . PERCUTANEOUS PINNING Left 07/26/2018   Procedure: PERCUTANEOUS PINNING EXTREMITY;  Surgeon: Dayna Barker, MD;  Location: [place];  Service: Plastics;  Laterality: Left;    Family History  Problem Relation Age of Onset  . Colon cancer Sister        passed away from colon cancer, in her 63s  . Heart disease Mother   . Hypertension Mother   . Diabetes Father   . Hypertension Father   . Diabetes Other   . Heart attack Maternal Uncle     No Known Allergies  Outpatient Medications Prior to Visit  Medication Sig Dispense Refill  . lisinopril (ZESTRIL) 20 MG tablet Take 1 tablet (20 mg total) by mouth daily. 90 tablet 3  . atorvastatin (LIPITOR) 40 MG tablet Take 1 tablet (40 mg total) by mouth daily. (Patient not taking: Reported on 02/17/2019) 90 tablet 3   No facility-administered medications prior to visit.      ROS Review of Systems  Constitutional: Negative for activity change and appetite change.  HENT: Negative for sinus pressure and sore throat.   Eyes: Negative for visual disturbance.  Respiratory: Negative for cough, chest tightness and shortness of breath.   Cardiovascular: Negative for chest pain and leg swelling.  Gastrointestinal: Negative for abdominal distention, abdominal pain, constipation and diarrhea.  Endocrine: Negative.   Genitourinary: Negative for dysuria.  Musculoskeletal:       See HPI  Skin: Negative for rash.  Allergic/Immunologic: Negative.   Neurological: Negative for weakness, light-headedness and numbness.  Psychiatric/Behavioral: Negative for dysphoric mood and suicidal ideas.    Objective:  BP (!) 148/101   Pulse 83   Temp (!) 97.3 F (36.3 C) (Temporal)   Resp 17   Ht 5\' 8"  (1.727 m)   Wt 223 lb (101.2 kg)   SpO2 97%   BMI 33.91 kg/m   BP/Weight  02/17/2019 11/17/2018 [id number]  Systolic BP 123456 123456 [id number]  Diastolic BP 99991111 0000000 [id number]  Wt. (Lbs) 223 230.8 -  BMI 33.91 35.09 -      Physical Exam Constitutional:      Appearance: He is well-developed.  Neck:     Vascular: No JVD.  Cardiovascular:     Rate and Rhythm: Normal rate.     Heart sounds: Normal heart sounds. No murmur.  Pulmonary:     Effort: Pulmonary effort is normal.     Breath sounds: Normal breath sounds. No wheezing or rales.  Chest:     Chest wall: No tenderness.  Abdominal:     General: Bowel sounds are normal. There is no distension.     Palpations: Abdomen is soft. There is no mass.     Tenderness: There is no abdominal tenderness.  Musculoskeletal:     Right lower leg: No edema.     Left lower leg: No edema.     Comments: Healed vertical scar on dorsum of left hand, unable to make a fist in left hand. Right is normal  Neurological:     Mental Status: He is alert and oriented to person, place, and time.  Psychiatric:        Mood and Affect: Mood normal.     CMP Latest Ref Rng & Units 11/17/2018 07/26/2018 03/03/2017  Glucose 65 - 99 mg/dL 95 114(H) 109(H)  BUN 6 - 24 mg/dL 18 13 19   Creatinine 0.76 - 1.27 mg/dL 1.11 1.13 1.29(H)  Sodium 134 - 144 mmol/L 140 136 135  Potassium 3.5 - 5.2 mmol/L 4.6 4.2 4.1  Chloride 96 - 106 mmol/L 104 103 103  CO2 20 - 29 mmol/L 19(L) 24 24  Calcium 8.7 - 10.2 mg/dL 9.7 9.3 8.9  Total Protein 6.0 - 8.5 g/dL 7.7 - -  Total Bilirubin 0.0 - 1.2 mg/dL 0.3 - -  Alkaline Phos 39 - 117 IU/L 86 - -  AST 0 - 40 IU/L 25 - -  ALT 0 - 44 IU/L 27 - -    Lipid Panel     Component Value Date/Time   CHOL 241 (H) 11/17/2018 1641   TRIG 245 (H) 11/17/2018 1641   HDL 50 11/17/2018 1641   CHOLHDL 4.8 11/17/2018 1641   CHOLHDL 8.5 06/23/2008 0810   VLDL 31 06/23/2008 0810   LDLCALC 142 (H) 11/17/2018 1641    CBC    Component Value Date/Time   WBC 9.1 11/17/2018 1641   WBC 10.6 (H) 07/26/2018 1344   RBC 5.55 11/17/2018 1641    RBC 5.46 07/26/2018 1344   HGB 16.1 11/17/2018 1641   HCT 48.0 11/17/2018 1641   PLT 287 11/17/2018 1641   MCV 87 11/17/2018 1641   MCH 29.0 11/17/2018 1641   MCH 27.5 07/26/2018 1344   MCHC 33.5 11/17/2018 1641   MCHC 30.7 07/26/2018 1344   RDW 12.6 11/17/2018 1641   LYMPHSABS  3.0 11/17/2018 1641   MONOABS 0.5 06/01/2012 1150   EOSABS 0.1 11/17/2018 1641   BASOSABS 0.0 11/17/2018 1641    Lab Results  Component Value Date   HGBA1C 5.7 (H) 11/17/2018    Assessment & Plan:   1. Essential hypertension Uncontrolled; states BP is controlled at other Doctors visits; he also just had a ciggarrette Currently on Lisinopril Would love to add HCTZ but he declines Lifestyle modifications and reassess at next visit - Basic Metabolic Panel  2. Pure hypercholesterolemia Uncontrolled Unable to afford Lipitor He will be working at obtaining this through his insurance which will be cheaper  3. Injury of left hand, subsequent encounter Symptomatic Workers' comp case Continue with home exercised   Health Care Maintenance: declines Flu shot No orders of the defined types were placed in this encounter.   Follow-up: Return in about 3 months (around 05/19/2019) for medical conditions.       Charlott Rakes, MD, FAAFP. [facility] [place], [place]   02/17/2019, 9:32 AM

## 2019-02-18 LAB — BASIC METABOLIC PANEL
BUN/Creatinine Ratio: 13 (ref 9–20)
BUN: 15 mg/dL (ref 6–24)
CO2: 21 mmol/L (ref 20–29)
Calcium: 9.8 mg/dL (ref 8.7–10.2)
Chloride: 101 mmol/L (ref 96–106)
Creatinine, Ser: 1.13 mg/dL (ref 0.76–1.27)
GFR calc Af Amer: 88 mL/min/{1.73_m2} (ref >59)
GFR calc non Af Amer: 76 mL/min/{1.73_m2} (ref >59)
Glucose: 97 mg/dL (ref 65–99)
Potassium: 4.5 mmol/L (ref 3.5–5.2)
Sodium: 136 mmol/L (ref 134–144)

## 2019-02-18 LAB — SPECIMEN STATUS REPORT

## 2019-02-18 LAB — HIV ANTIBODY (ROUTINE TESTING W REFLEX): HIV Screen 4th Generation wRfx: NONREACTIVE

## 2019-02-25 ENCOUNTER — Telehealth: Payer: Self-pay

## 2019-02-25 NOTE — Telephone Encounter (Signed)
-----   Message from Charlott Rakes, MD sent at 02/22/2019  4:53 PM EDT ----- Please inform the patient that labs are normal. Thank you.

## 2019-02-25 NOTE — Telephone Encounter (Signed)
Patient name and DOB has been verified Patient was informed of lab results. Patient had no questions.  

## 2019-04-25 DIAGNOSIS — M79642 Pain in left hand: Secondary | ICD-10-CM | POA: Insufficient documentation

## 2019-05-04 ENCOUNTER — Emergency Department (HOSPITAL_COMMUNITY): Payer: No Typology Code available for payment source

## 2019-05-04 ENCOUNTER — Other Ambulatory Visit: Payer: Self-pay

## 2019-05-04 ENCOUNTER — Emergency Department (HOSPITAL_COMMUNITY)
Admission: EM | Admit: 2019-05-04 | Discharge: 2019-05-04 | Disposition: A | Discharge status: Home / Self Care | Payer: No Typology Code available for payment source | Attending: Emergency Medicine | Admitting: Emergency Medicine

## 2019-05-04 ENCOUNTER — Encounter (HOSPITAL_COMMUNITY): Payer: Self-pay | Admitting: Emergency Medicine

## 2019-05-04 DIAGNOSIS — R0789 Other chest pain: Secondary | ICD-10-CM

## 2019-05-04 DIAGNOSIS — F17210 Nicotine dependence, cigarettes, uncomplicated: Secondary | ICD-10-CM | POA: Insufficient documentation

## 2019-05-04 DIAGNOSIS — I10 Essential (primary) hypertension: Secondary | ICD-10-CM | POA: Insufficient documentation

## 2019-05-04 DIAGNOSIS — Z79899 Other long term (current) drug therapy: Secondary | ICD-10-CM | POA: Diagnosis not present

## 2019-05-04 DIAGNOSIS — R519 Headache, unspecified: Secondary | ICD-10-CM | POA: Diagnosis present

## 2019-05-04 LAB — BASIC METABOLIC PANEL
Anion gap: 10 (ref 5–15)
BUN: 16 mg/dL (ref 6–20)
CO2: 23 mmol/L (ref 22–32)
Calcium: 9.5 mg/dL (ref 8.9–10.3)
Chloride: 101 mmol/L (ref 98–111)
Creatinine, Ser: 1.12 mg/dL (ref 0.61–1.24)
GFR calc Af Amer: >60 mL/min (ref >60)
GFR calc non Af Amer: >60 mL/min (ref >60)
Glucose, Bld: 109 mg/dL — ABNORMAL HIGH (ref 70–99)
Potassium: 4.5 mmol/L (ref 3.5–5.1)
Sodium: 134 mmol/L — ABNORMAL LOW (ref 135–145)

## 2019-05-04 LAB — CBC
HCT: 48.9 % (ref 39.0–52.0)
Hemoglobin: 16.1 g/dL (ref 13.0–17.0)
MCH: 29 pg (ref 26.0–34.0)
MCHC: 32.9 g/dL (ref 30.0–36.0)
MCV: 88.1 fL (ref 80.0–100.0)
Platelets: 245 10*3/uL (ref 150–400)
RBC: 5.55 MIL/uL (ref 4.22–5.81)
RDW: 12.1 % (ref 11.5–15.5)
WBC: 8.7 10*3/uL (ref 4.0–10.5)
nRBC: 0 % (ref 0.0–0.2)

## 2019-05-04 LAB — TROPONIN I (HIGH SENSITIVITY): Troponin I (High Sensitivity): 3 ng/L (ref <18)

## 2019-05-04 MED ORDER — SODIUM CHLORIDE 0.9% FLUSH: 3.0000 mL | Freq: Once | INTRAVENOUS | Status: DC

## 2019-05-04 NOTE — ED Provider Notes (Signed)
[place] EMERGENCY DEPARTMENT Provider Note   CSN: [id number] Arrival date & time: 05/04/19  1439     History   Chief Complaint Chief Complaint  Patient presents with  . Chest Pain  . Headache  . Nausea    HPI [name] is a 48 y.o. male who presents emergency department for evaluation of chest tightness and headache.  Patient states he had onset of moderate headache today and felt some chest tightness.  Patient states "I think I just got worked up."  He has a past medical history of reflux, hypertension, hyperlipidemia, tobacco abuse, alcohol abuse.  He states that it started about 2 PM lasting about an hour.  He had no associated shortness of breath, nausea describes the sensation is very mild.  He has no active chest discomfort or headache at this time.  Patient denies changes in vision, neck stiffness, sensitivity to light or sound, unilateral leg weakness, difficulty with speech or swallowing.  Patient does not wish to stay for any further evaluation at this time.     HPI  Past Medical History:  Diagnosis Date  . GERD (gastroesophageal reflux disease)   . Hepatic cyst    Benign by MRI  . Hiatal hernia   . History of colonic polyps   . Hypertension   . Rectal bleeding     Patient Active Problem List   Diagnosis Date Noted  . Hyperlipidemia 02/17/2019  . Lesion of liver 09/23/2017  . Family history of colon cancer 05/01/2016  . Chest pain 05/01/2016  . Abnormal ultrasound 07/19/2015  . Erosive esophagitis 04/11/2014  . Hx of adenomatous colonic polyps 04/11/2014  . Abdominal pain 06/01/2012  . Rectal bleeding 06/01/2012  . Constipation 06/01/2012  . ALCOHOL ABUSE 09/06/2008  . TOBACCO ABUSE 09/06/2008  . Essential hypertension 09/06/2008  . GASTROESOPHAGEAL REFLUX DISEASE, CHRONIC 09/06/2008  . CONSTIPATION, CHRONIC 09/06/2008  . DYSPHAGIA UNSPECIFIED 09/06/2008    Past Surgical History:  Procedure Laterality Date  . COLONOSCOPY   01/10/2009     RMR: Normal rectum, normal colon; repeat in 2015 due to [place] of colon cancer  . COLONOSCOPY N/A 01/09/2014   [id number] colonic polyps-removed as described  . COLONOSCOPY N/A 09/18/2016   Procedure: COLONOSCOPY;  Surgeon: Daneil Dolin, MD;  [facility];  Service: Endoscopy;  Laterality: N/A;  730   . ESOPHAGOGASTRODUODENOSCOPY  10/04/2007   RMR: Distal esophageal erosions consistent with erosive reflux esophagitis, patulous gastroesophageal junction status post passage of a  Maloney dilator, 66 [place].  Otherwise, unremarkable esophagus.  Hiatal hernia.  Otherwise normal stomach.  Bulbar erosion  . ESOPHAGOGASTRODUODENOSCOPY N/A 01/09/2014   Erosive reflux esophagitis. Small hiatal hernia  . I&D EXTREMITY Left 07/26/2018   Procedure: IRRIGATION AND DEBRIDEMENT EXTREMITY;  Surgeon: Dayna Barker, MD;  Location: [place];  Service: Plastics;  Laterality: Left;  . PERCUTANEOUS PINNING Left 07/26/2018   Procedure: PERCUTANEOUS PINNING EXTREMITY;  Surgeon: Dayna Barker, MD;  Location: [place];  Service: Plastics;  Laterality: Left;        Home Medications    Prior to Admission medications   Medication Sig Start Date End Date Taking? Authorizing Provider  atorvastatin (LIPITOR) 40 MG tablet Take 1 tablet (40 mg total) by mouth daily. Patient not taking: Reported on 02/17/2019 11/19/18   Scot Jun, FNP  lisinopril (ZESTRIL) 20 MG tablet Take 1 tablet (20 mg total) by mouth daily. 11/17/18   Scot Jun, FNP    Family History Family  History  Problem Relation Age of Onset  . Colon cancer Sister        passed away from colon cancer, in her 16s  . Heart disease Mother   . Hypertension Mother   . Diabetes Father   . Hypertension Father   . Diabetes Other   . Heart attack Maternal Uncle     Social History Social History   Tobacco Use  . Smoking status: Current Every Day Smoker    Packs/day: 0.25    Years: 10.00    Pack years: 2.50    Types:  Cigarettes  . Smokeless tobacco: Never Used  Substance Use Topics  . Alcohol use: Yes    Alcohol/week: 0.0 standard drinks    Comment: occasional/wine on the weekends  . Drug use: No     Allergies   Patient has no known allergies.   Review of Systems Review of Systems Ten systems reviewed and are negative for acute change, except as noted in the HPI.    Physical Exam Updated Vital Signs BP (!) 131/95 (BP Location: Left Arm)   Pulse 87   Temp 98.5 F (36.9 C) (Oral)   Resp 16   SpO2 100%   Physical Exam Vitals signs and nursing note reviewed.  Constitutional:      General: He is not in acute distress.    Appearance: He is well-developed. He is not diaphoretic.  HENT:     Head: Normocephalic and atraumatic.  Eyes:     General: No scleral icterus.    Conjunctiva/sclera: Conjunctivae normal.     Pupils: Pupils are equal, round, and reactive to light.     Comments: No horizontal, vertical or rotational nystagmus  Neck:     Musculoskeletal: Normal range of motion and neck supple.     Comments: Full active and passive ROM without pain No midline or paraspinal tenderness No nuchal rigidity or meningeal signs Cardiovascular:     Rate and Rhythm: Normal rate and regular rhythm.  Pulmonary:     Effort: Pulmonary effort is normal. No respiratory distress.     Breath sounds: Normal breath sounds. No wheezing or rales.  Abdominal:     General: Bowel sounds are normal.     Palpations: Abdomen is soft.     Tenderness: There is no abdominal tenderness. There is no guarding or rebound.  Musculoskeletal: Normal range of motion.  Lymphadenopathy:     Cervical: No cervical adenopathy.  Skin:    General: Skin is warm and dry.     Findings: No rash.  Neurological:     Mental Status: He is alert and oriented to person, place, and time.     Cranial Nerves: No cranial nerve deficit.     Motor: No abnormal muscle tone.     Coordination: Coordination normal.     Comments: Mental  Status:  Alert, oriented, thought content appropriate. Speech fluent without evidence of aphasia. Able to follow 2 step commands without difficulty.  Cranial Nerves:  II:  Peripheral visual fields grossly normal, pupils equal, round, reactive to light III,IV, VI: ptosis not present, extra-ocular motions intact bilaterally  V,VII: smile symmetric, facial light touch sensation equal VIII: hearing grossly normal bilaterally  IX,X: midline uvula rise  XI: bilateral shoulder shrug equal and strong XII: midline tongue extension  Motor:  5/5 in upper and lower extremities bilaterally including strong and equal grip strength and dorsiflexion/plantar flexion Sensory: Pinprick and light touch normal in all extremities.  Cerebellar: normal finger-to-nose with bilateral  upper extremities Gait: normal gait and balance CV: distal pulses palpable throughout   Psychiatric:        Behavior: Behavior normal.        Thought Content: Thought content normal.        Judgment: Judgment normal.      ED Treatments / Results  Labs (all labs ordered are listed, but only abnormal results are displayed) Labs Reviewed  BASIC METABOLIC PANEL - Abnormal; Notable for the following components:      Result Value   Sodium 134 (*)    Glucose, Bld 109 (*)    All other components within normal limits  CBC  TROPONIN I (HIGH SENSITIVITY)  TROPONIN I (HIGH SENSITIVITY)    EKG EKG Interpretation  Date/Time:  Wednesday May 04 2019 15:06:55 EST Ventricular Rate:  92 PR Interval:  144 QRS Duration: 84 QT Interval:  324 QTC Calculation: 400 R Axis:   34 Text Interpretation: Normal sinus rhythm Normal ECG Confirmed by Lennice Sites [phone]) on 05/04/2019 5:39:05 PM   Radiology Dg Chest 2 View  Result Date: 05/04/2019 CLINICAL DATA:  48 year old current smoker presenting with a 2 day history of ANTERIOR UPPER chest pain and headaches. EXAM: CHEST - 2 VIEW COMPARISON:  03/03/2017 and earlier. FINDINGS:  Cardiomediastinal silhouette unremarkable and unchanged. Lungs clear. Bronchovascular markings normal. Pulmonary vascularity normal. No visible pleural effusions. No pneumothorax. Mild degenerative changes involving the thoracic spine. No interval change. IMPRESSION: No acute cardiopulmonary disease. Stable examination. Electronically Signed   By: Evangeline Dakin M.D.   On: 05/04/2019 15:39    Procedures Procedures (including critical care time)  Medications Ordered in ED Medications  sodium chloride flush (NS) 0.9 % injection 3 mL (has no administration in time range)     Initial Impression / Assessment and Plan / ED Course  I have reviewed the triage vital signs and the nursing notes.  Pertinent labs & imaging results that were available during my care of the patient were reviewed by me and considered in my medical decision making (see chart for details).        Patient here with cp and mild headache which have both resolved. The emergent differential diagnosis of chest pain includes: Acute coronary syndrome, pericarditis, aortic dissection, pulmonary embolism, tension pneumothorax, pneumonia, and esophageal rupture. Emergent considerations for headache include subarachnoid hemorrhage, meningitis, temporal arteritis, glaucoma, cerebral ischemia, carotid/vertebral dissection, intracranial tumor, Venous sinus thrombosis, carbon monoxide poisoning, acute or chronic subdural hemorrhage.  Other considerations include: Migraine, Cluster headache, Hypertension, Caffeine, alcohol, or drug withdrawal, Pseudotumor cerebri, Arteriovenous malformation, Head injury, Neurocysticercosis, Post-lumbar puncture, Preeclampsia, Tension headache, Sinusitis, Cervical arthritis, Refractive error causing strain, Dental abscess, Otitis media, Temporomandibular joint syndrome, Depression, Somatoform disorder (eg, somatization) Trigeminal neuralgia, Glossopharyngeal neuralgia.  Patient has a heart score of 3, he  has a negative troponin, EKG shows no signs of ischemia.  Patient's BMP shows mild glucose elevation of insignificant value.  CBC shows no abnormalities.  I personally reviewed the patient's 2 view chest x-ray which shows no acute abnormalities on my interpretation.  Patient has a follow-up appointment with his primary care physician tomorrow morning.  I discussed return precautions.  Feel he is safe for discharge at this time.     Final Clinical Impressions(s) / ED Diagnoses   Final diagnoses:  Moderate headache  Chest pressure    ED Discharge Orders    None       Margarita Mail, PA-C 05/04/19 2341    Lennice Sites, DO 05/05/19 [phone]

## 2019-05-04 NOTE — Discharge Instructions (Addendum)
Your caregiver has diagnosed you as having chest pain that is not specific for one problem, but does not require admission.  You are at low risk for an acute heart condition or other serious illness. Chest pain comes from many different causes.  SEEK IMMEDIATE MEDICAL ATTENTION IF: You have severe chest pain, especially if the pain is crushing or pressure-like and spreads to the arms, back, neck, or jaw, or if you have sweating, nausea (feeling sick to your stomach), or shortness of breath. THIS IS AN EMERGENCY. Don't wait to see if the pain will go away. Get medical help at once. Call 911 or 0 (operator). DO NOT drive yourself to the hospital.  Your chest pain gets worse and does not go away with rest.  You have an attack of chest pain lasting longer than usual, despite rest and treatment with the medications your caregiver has prescribed.  You wake from sleep with chest pain or shortness of breath.  You feel dizzy or faint.  You have chest pain not typical of your usual pain for which you originally saw your caregiver.  You are having a headache. No specific cause was found today for your headache. It may have been a migraine or other cause of headache. Stress, anxiety, fatigue, and depression are common triggers for headaches. Your headache today does not appear to be life-threatening or require hospitalization, but often the exact cause of headaches is not determined in the emergency department. Therefore, follow-up with your doctor is very important to find out what may have caused your headache, and whether or not you need any further diagnostic testing or treatment. Sometimes headaches can appear benign (not harmful), but then more serious symptoms can develop which should prompt an immediate re-evaluation by your doctor or the emergency department. SEEK MEDICAL ATTENTION IF: You develop possible problems with medications prescribed.  The medications don't resolve your headache, if it recurs , or  if you have multiple episodes of vomiting or can't take fluids. You have a change from the usual headache. RETURN IMMEDIATELY IF you develop a sudden, severe headache or confusion, become poorly responsive or faint, develop a fever above 100.30F or problem breathing, have a change in speech, vision, swallowing, or understanding, or develop new weakness, numbness, tingling, incoordination, or have a seizure.

## 2019-05-04 NOTE — ED Triage Notes (Addendum)
C/o generalized chest pain, headache, nausea, mild SOB, and dizziness x 2 days.  No neuro deficits noted on triage exam.

## 2019-05-05 ENCOUNTER — Telehealth (INDEPENDENT_AMBULATORY_CARE_PROVIDER_SITE_OTHER): Payer: No Typology Code available for payment source | Admitting: Family Medicine

## 2019-05-05 DIAGNOSIS — K219 Gastro-esophageal reflux disease without esophagitis: Secondary | ICD-10-CM

## 2019-05-05 DIAGNOSIS — J320 Chronic maxillary sinusitis: Secondary | ICD-10-CM

## 2019-05-05 MED ORDER — FLUTICASONE PROPIONATE 50 MCG/ACT NA SUSP: 1.0000 | Freq: Every day | NASAL | 3 refills | Status: DC

## 2019-05-05 MED ORDER — CETIRIZINE HCL 10 MG PO TABS: 10.0000 mg | ORAL_TABLET | Freq: Every day | ORAL | 3 refills | Status: DC

## 2019-05-05 MED ORDER — OMEPRAZOLE 40 MG PO CPDR: 40.0000 mg | DELAYED_RELEASE_CAPSULE | Freq: Every day | ORAL | 3 refills | Status: DC

## 2019-05-05 NOTE — Progress Notes (Signed)
Patient has c/o possible sinus infection. Hasn't taken anything OTC. Has a history sinus issues.

## 2019-05-05 NOTE — Progress Notes (Signed)
Virtual Visit via Video Note  I connected with [name], on 05/05/2019 at 11:01 AM by video enabled telemedicine device due to the COVID-19 pandemic and verified that I am speaking with the correct person using two identifiers.   Consent: I discussed the limitations, risks, security and privacy concerns of performing an evaluation and management service by telemedicine and the availability of in person appointments. I also discussed with the patient that there may be a patient responsible charge related to this service. The patient expressed understanding and agreed to proceed.   Location of Patient: Home  Location of Provider: Clinic   Persons participating in Telemedicine visit: [name] Dr. Margarita Rana     History of Present Illness: [name] is a 48 year old male with a history of Hypertension, Hyperlipidemia here for an acute visit.  He complains of episodic headaches which are frontal, sometimes on the right side of his head and sometimes radiate down his neck with associated maxillary pressure.  This is typical of sinus symptoms which he has had in the past but is currently not on medications.  He does endorse the presence of sinus drainage but no myalgias, fevers, blurry vision, nausea or vomiting He was seen at the ED for this yesterday but did not wait for further evaluation.  He also complains of acid reflux with presence of bitter taste in his mouth and was on Dexilant in the past.  He has no additional GI symptoms.  Past Medical History:  Diagnosis Date  . GERD (gastroesophageal reflux disease)   . Hepatic cyst    Benign by MRI  . Hiatal hernia   . History of colonic polyps   . Hypertension   . Rectal bleeding    No Known Allergies  Current Outpatient Medications on File Prior to Visit  Medication Sig Dispense Refill  . lisinopril (ZESTRIL) 20 MG tablet Take 1 tablet (20 mg total) by mouth daily. 90 tablet 3   No current  facility-administered medications on file prior to visit.    Observations/Objective: Alert, awake, oriented x3 Not in acute distress  Assessment and Plan: 1. Chronic maxillary sinusitis Advised to use saline irrigation Headache likely sinus in origin. - fluticasone (FLONASE) 50 MCG/ACT nasal spray; Place 1 spray into both nostrils daily.  Dispense: 16 g; Refill: 3 - cetirizine (ZYRTEC) 10 MG tablet; Take 1 tablet (10 mg total) by mouth daily.  Dispense: 30 tablet; Refill: 3  2. Gastroesophageal reflux disease without esophagitis Resume PPI - omeprazole (PRILOSEC) 40 MG capsule; Take 1 capsule (40 mg total) by mouth daily.  Dispense: 30 capsule; Refill: 3  Follow Up Instructions: Keep previously scheduled appointment   I discussed the assessment and treatment plan with the patient. The patient was provided an opportunity to ask questions and all were answered. The patient agreed with the plan and demonstrated an understanding of the instructions.   The patient was advised to call back or seek an in-person evaluation if the symptoms worsen or if the condition fails to improve as anticipated.     I provided 12 minutes total of Telehealth time during this encounter including median intraservice time, reviewing previous notes, labs, imaging, medications and explaining diagnosis and management.     Charlott Rakes, MD, FAAFP. [facility] [place], [place]   05/05/2019, 11:01 AM

## 2019-05-10 ENCOUNTER — Telehealth: Payer: Self-pay | Admitting: Family Medicine

## 2019-05-10 DIAGNOSIS — J320 Chronic maxillary sinusitis: Secondary | ICD-10-CM

## 2019-05-10 MED ORDER — CETIRIZINE HCL 10 MG PO TABS: 10.0000 mg | ORAL_TABLET | Freq: Every day | ORAL | 3 refills | Status: DC

## 2019-05-10 MED ORDER — FLUTICASONE PROPIONATE 50 MCG/ACT NA SUSP: 1.0000 | Freq: Every day | NASAL | 3 refills | Status: DC

## 2019-05-10 NOTE — Telephone Encounter (Signed)
Done

## 2019-05-10 NOTE — Telephone Encounter (Signed)
Called pt and made aware.

## 2019-05-10 NOTE — Telephone Encounter (Signed)
Pt called in regards to an antibiotic offered on his last Video visit(05/05/2019) due to his headaches. States now he would like the antibiotic and sent to  [place] ([place]), [place] - [address]   Please follow up

## 2019-05-11 ENCOUNTER — Telehealth: Payer: Self-pay | Admitting: Family Medicine

## 2019-05-11 MED ORDER — AMOXICILLIN 500 MG PO CAPS: 500.0000 mg | ORAL_CAPSULE | Freq: Three times a day (TID) | ORAL | 0 refills | Status: DC

## 2019-05-11 NOTE — Telephone Encounter (Signed)
Please advise 

## 2019-05-11 NOTE — Telephone Encounter (Signed)
I have sent a prescription for an antibiotic to his pharmacy.

## 2019-05-11 NOTE — Telephone Encounter (Signed)
Pt called again states he was offered an antibiotic for his sinus issues/headaches. Provider only sent Zyrtec and Flonase and he had already picked up and have not helped. Please follow up as he is looking for an antibiotic that was offered on 05/05/2019.   [facility] ([place] [place] - [address]

## 2019-05-12 NOTE — Telephone Encounter (Signed)
Patient notified that antibiotic was sent to requested pharmacy.

## 2019-05-24 ENCOUNTER — Telehealth: Payer: Self-pay

## 2019-05-24 NOTE — Telephone Encounter (Signed)

## 2019-05-25 ENCOUNTER — Other Ambulatory Visit: Payer: Self-pay

## 2019-05-25 ENCOUNTER — Ambulatory Visit (INDEPENDENT_AMBULATORY_CARE_PROVIDER_SITE_OTHER): Payer: No Typology Code available for payment source | Admitting: Physician Assistant

## 2019-05-25 VITALS — BP 152/112 | HR 91 | Temp 97.3°F | Resp 17 | Wt 232.6 lb

## 2019-05-25 DIAGNOSIS — K219 Gastro-esophageal reflux disease without esophagitis: Secondary | ICD-10-CM

## 2019-05-25 DIAGNOSIS — R1013 Epigastric pain: Secondary | ICD-10-CM

## 2019-05-25 DIAGNOSIS — R519 Headache, unspecified: Secondary | ICD-10-CM | POA: Diagnosis not present

## 2019-05-25 DIAGNOSIS — R079 Chest pain, unspecified: Secondary | ICD-10-CM

## 2019-05-25 DIAGNOSIS — I10 Essential (primary) hypertension: Secondary | ICD-10-CM

## 2019-05-25 DIAGNOSIS — R0789 Other chest pain: Secondary | ICD-10-CM | POA: Diagnosis not present

## 2019-05-25 MED ORDER — LISINOPRIL-HYDROCHLOROTHIAZIDE 20-25 MG PO TABS: 1.0000 | ORAL_TABLET | Freq: Every day | ORAL | 3 refills | Status: DC

## 2019-05-25 MED ORDER — ASPIRIN EC 81 MG PO TBEC: 81.0000 mg | DELAYED_RELEASE_TABLET | Freq: Every day | ORAL | 3 refills | Status: DC

## 2019-05-25 NOTE — Progress Notes (Signed)
Patient ID: [name], male   DOB: 01-18-1971, 48 y.o.   MRN: [id number]   [name], is a 48 y.o. male  [id number]  [id number]  DOB - 1970/11/15  Subjective:  Chief Complaint and HPI: [name] is a 48 y.o. male here today for a follow up visit  after ED visit 05/04/2019 for HA and CP.  Today he c/o mid epigastric pain, CP, mild HA that has been going on for weeks.  Sometimes it lasts all day.  Occasional nausea.  Recently treated for sinus infection.  Had neg Covid test a couple of weeks ago at HD.  Appetite is good.  No SOB with CP.  No precipitating or alleviating factors.  Sometimes has pain into L arm and upper back and back of neck.  Describes as aching pain.  No chest pressure.  No paresthesias.  No vomiting.  No diarrhea.  No cough.  +smoker  Family history:  Mom and dad took "heart medicines" in their 84s-50s but no MI/stroke at young ages.    From ED A/P: Patient here with cp and mild headache which have both resolved. The emergent differential diagnosis of chest pain includes: Acute coronary syndrome, pericarditis, aortic dissection, pulmonary embolism, tension pneumothorax, pneumonia, and esophageal rupture. Emergent considerations for headache include subarachnoid hemorrhage, meningitis, temporal arteritis, glaucoma, cerebral ischemia, carotid/vertebral dissection, intracranial tumor, Venous sinus thrombosis, carbon monoxide poisoning, acute or chronic subdural hemorrhage.  Other considerations include: Migraine, Cluster headache, Hypertension, Caffeine, alcohol, or drug withdrawal, Pseudotumor cerebri, Arteriovenous malformation, Head injury, Neurocysticercosis, Post-lumbar puncture, Preeclampsia, Tension headache, Sinusitis, Cervical arthritis, Refractive error causing strain, Dental abscess, Otitis media, Temporomandibular joint syndrome, Depression, Somatoform disorder (eg, somatization) Trigeminal neuralgia, Glossopharyngeal neuralgia.  Patient has a heart  score of 3, he has a negative troponin, EKG shows no signs of ischemia.  Patient's BMP shows mild glucose elevation of insignificant value.  CBC shows no abnormalities.  I personally reviewed the patient's 2 view chest x-ray which shows no acute abnormalities  ED/Hospital notes reviewed.   Social History: Family history:  ROS:   Constitutional:  No f/c, No night sweats, No unexplained weight loss. EENT:  No vision changes, No blurry vision, No hearing changes. No mouth, throat, or ear problems.  Respiratory: No cough, No SOB Cardiac: + CP, no palpitations GI:  + abd pain, No V/D/C.  No melena or hematochezia GU: No Urinary s/sx Musculoskeletal: No joint pain Neuro: No headache, no dizziness, no motor weakness.  Skin: No rash Endocrine:  No polydipsia. No polyuria.  Psych: Denies SI/HI  No problems updated.  ALLERGIES: No Known Allergies  PAST MEDICAL HISTORY: Past Medical History:  Diagnosis Date  . GERD (gastroesophageal reflux disease)   . Hepatic cyst    Benign by MRI  . Hiatal hernia   . History of colonic polyps   . Hypertension   . Rectal bleeding     MEDICATIONS AT HOME: Prior to Admission medications   Medication Sig Start Date End Date Taking? Authorizing Provider  cetirizine (ZYRTEC) 10 MG tablet Take 1 tablet (10 mg total) by mouth daily. 05/10/19   Charlott Rakes, MD  fluticasone (FLONASE) 50 MCG/ACT nasal spray Place 1 spray into both nostrils daily. 05/10/19   Charlott Rakes, MD  lisinopril-hydrochlorothiazide (ZESTORETIC) 20-25 MG tablet Take 1 tablet by mouth daily. 05/25/19   Argentina Donovan, PA-C  omeprazole (PRILOSEC) 40 MG capsule Take 1 capsule (40 mg total) by mouth daily. 05/05/19   Charlott Rakes, MD  Objective:  EXAM:   Vitals:   05/25/19 0855  BP: (!) 152/112  Pulse: 91  Resp: 17  Temp: (!) 97.3 F (36.3 C)  TempSrc: Temporal  SpO2: 95%  Weight: 232 lb 9.6 oz (105.5 kg)    General appearance : A&OX3. NAD.  Non-toxic-appearing HEENT: Atraumatic and Normocephalic.  PERRLA. EOM intact.  Neck: supple, no JVD. No cervical lymphadenopathy. No thyromegaly Chest/Lungs:  Breathing-non-labored, Good air entry bilaterally, breath sounds normal without rales, rhonchi, or wheezing  CVS: S1 S2 regular, no murmurs, gallops, rubs  Abdomen: Bowel sounds present, Non tender and not distended with no gaurding, rigidity or rebound. Extremities: Bilateral Lower Ext shows no edema, both legs are warm to touch with = pulse throughout Neurology:  CN II-XII grossly intact, Non focal.   Psych:  TP linear. J/I WNL. Normal speech. Appropriate eye contact and affect.  Skin:  No Rash  Data Review Lab Results  Component Value Date   HGBA1C 5.7 (H) 11/17/2018   HGBA1C  09/17/2007    5.7 (NOTE)   The ADA recommends the following therapeutic goals for glycemic   control related to Hgb A1C measurement:   Goal of Therapy:   < 7.0% Hgb A1C   Action Suggested:  > 8.0% Hgb A1C   Ref:  Diabetes Care, 22, Suppl. 1, 1999     Assessment & Plan   1. Chest pain, unspecified type Start baby ASA.  Stop plain lisinopril.  start- lisinopril-hydrochlorothiazide (ZESTORETIC) 20-25 MG tablet; Take 1 tablet by mouth daily.  Dispense: 90 tablet; Refill: 3 - EKG 12-Lead - TSH - Ambulatory referral to Cardiology -call 911 if new/worsening s/sx develop. -EKG today w/o ST elevation/ST changes and compared to 05/05/2019  2. Essential hypertension Uncontrolled-stop plain lisinopril.  Check BP 3-4 times weekly and record and bring to next visit - Comprehensive metabolic panel - lisinopril-hydrochlorothiazide (ZESTORETIC) 20-25 MG tablet; Take 1 tablet by mouth daily.  Dispense: 90 tablet; Refill: 3 - CBC with Differential - TSH - Ambulatory referral to Cardiology  3. Gastroesophageal reflux disease without esophagitis - H. pylori breath test   Patient have been counseled extensively about nutrition and exercise  Return in about 1  month (around 06/25/2019) for assign new PCP and f/up BP.  The patient was given clear instructions to go to ER or return to medical center if symptoms don't improve, worsen or new problems develop. The patient verbalized understanding. The patient was told to call to get lab results if they haven't heard anything in the next week.     Freeman Caldron, PA-C [facility] and [facility] [place], [place]   05/25/2019, 10:18 AM

## 2019-05-25 NOTE — Patient Instructions (Signed)
Check your blood pressure 3 times weekly and record and bring to next visit   Hypertension, Adult High blood pressure (hypertension) is when the force of blood pumping through the arteries is too strong. The arteries are the blood vessels that carry blood from the heart throughout the body. Hypertension forces the heart to work harder to pump blood and may cause arteries to become narrow or stiff. Untreated or uncontrolled hypertension can cause a heart attack, heart failure, a stroke, kidney disease, and other problems. A blood pressure reading consists of a higher number over a lower number. Ideally, your blood pressure should be below 120/80. The first ("top") number is called the systolic pressure. It is a measure of the pressure in your arteries as your heart beats. The second ("bottom") number is called the diastolic pressure. It is a measure of the pressure in your arteries as the heart relaxes. What are the causes? The exact cause of this condition is not known. There are some conditions that result in or are related to high blood pressure. What increases the risk? Some risk factors for high blood pressure are under your control. The following factors may make you more likely to develop this condition:  Smoking.  Having type 2 diabetes mellitus, high cholesterol, or both.  Not getting enough exercise or physical activity.  Being overweight.  Having too much fat, sugar, calories, or salt (sodium) in your diet.  Drinking too much alcohol. Some risk factors for high blood pressure may be difficult or impossible to change. Some of these factors include:  Having chronic kidney disease.  Having a family history of high blood pressure.  Age. Risk increases with age.  Race. You may be at higher risk if you are African American.  Gender. Men are at higher risk than women before age 34. After age 48, women are at higher risk than men.  Having obstructive sleep apnea.  Stress. What  are the signs or symptoms? High blood pressure may not cause symptoms. Very high blood pressure (hypertensive crisis) may cause:  Headache.  Anxiety.  Shortness of breath.  Nosebleed.  Nausea and vomiting.  Vision changes.  Severe chest pain.  Seizures. How is this diagnosed? This condition is diagnosed by measuring your blood pressure while you are seated, with your arm resting on a flat surface, your legs uncrossed, and your feet flat on the floor. The cuff of the blood pressure monitor will be placed directly against the skin of your upper arm at the level of your heart. It should be measured at least twice using the same arm. Certain conditions can cause a difference in blood pressure between your right and left arms. Certain factors can cause blood pressure readings to be lower or higher than normal for a short period of time:  When your blood pressure is higher when you are in a health care provider's office than when you are at home, this is called white coat hypertension. Most people with this condition do not need medicines.  When your blood pressure is higher at home than when you are in a health care provider's office, this is called masked hypertension. Most people with this condition may need medicines to control blood pressure. If you have a high blood pressure reading during one visit or you have normal blood pressure with other risk factors, you may be asked to:  Return on a different day to have your blood pressure checked again.  Monitor your blood pressure at home for  1 week or longer. If you are diagnosed with hypertension, you may have other blood or imaging tests to help your health care provider understand your overall risk for other conditions. How is this treated? This condition is treated by making healthy lifestyle changes, such as eating healthy foods, exercising more, and reducing your alcohol intake. Your health care provider may prescribe medicine if  lifestyle changes are not enough to get your blood pressure under control, and if:  Your systolic blood pressure is above 130.  Your diastolic blood pressure is above 80. Your personal target blood pressure may vary depending on your medical conditions, your age, and other factors. Follow these instructions at home: Eating and drinking   Eat a diet that is high in fiber and potassium, and low in sodium, added sugar, and fat. An example eating plan is called the DASH (Dietary Approaches to Stop Hypertension) diet. To eat this way: ? Eat plenty of fresh fruits and vegetables. Try to fill one half of your plate at each meal with fruits and vegetables. ? Eat whole grains, such as whole-wheat pasta, brown rice, or whole-grain bread. Fill about one fourth of your plate with whole grains. ? Eat or drink low-fat dairy products, such as skim milk or low-fat yogurt. ? Avoid fatty cuts of meat, processed or cured meats, and poultry with skin. Fill about one fourth of your plate with lean proteins, such as fish, chicken without skin, beans, eggs, or tofu. ? Avoid pre-made and processed foods. These tend to be higher in sodium, added sugar, and fat.  Reduce your daily sodium intake. Most people with hypertension should eat less than 1,500 mg of sodium a day.  Do not drink alcohol if: ? Your health care provider tells you not to drink. ? You are pregnant, may be pregnant, or are planning to become pregnant.  If you drink alcohol: ? Limit how much you use to:  0-1 drink a day for women.  0-2 drinks a day for men. ? Be aware of how much alcohol is in your drink. In the [place] one drink equals one 12 oz bottle of beer (355 mL), one 5 oz glass of wine (148 mL), or one 1 oz glass of hard liquor (44 mL). Lifestyle   Work with your health care provider to maintain a healthy body weight or to lose weight. Ask what an ideal weight is for you.  Get at least 30 minutes of exercise most days of the week.  Activities may include walking, swimming, or biking.  Include exercise to strengthen your muscles (resistance exercise), such as Pilates or lifting weights, as part of your weekly exercise routine. Try to do these types of exercises for 30 minutes at least 3 days a week.  Do not use any products that contain nicotine or tobacco, such as cigarettes, e-cigarettes, and chewing tobacco. If you need help quitting, ask your health care provider.  Monitor your blood pressure at home as told by your health care provider.  Keep all follow-up visits as told by your health care provider. This is important. Medicines  Take over-the-counter and prescription medicines only as told by your health care provider. Follow directions carefully. Blood pressure medicines must be taken as prescribed.  Do not skip doses of blood pressure medicine. Doing this puts you at risk for problems and can make the medicine less effective.  Ask your health care provider about side effects or reactions to medicines that you should watch for. Contact a  health care provider if you:  Think you are having a reaction to a medicine you are taking.  Have headaches that keep coming back (recurring).  Feel dizzy.  Have swelling in your ankles.  Have trouble with your vision. Get help right away if you:  Develop a severe headache or confusion.  Have unusual weakness or numbness.  Feel faint.  Have severe pain in your chest or abdomen.  Vomit repeatedly.  Have trouble breathing. Summary  Hypertension is when the force of blood pumping through your arteries is too strong. If this condition is not controlled, it may put you at risk for serious complications.  Your personal target blood pressure may vary depending on your medical conditions, your age, and other factors. For most people, a normal blood pressure is less than 120/80.  Hypertension is treated with lifestyle changes, medicines, or a combination of both.  Lifestyle changes include losing weight, eating a healthy, low-sodium diet, exercising more, and limiting alcohol. This information is not intended to replace advice given to you by your health care provider. Make sure you discuss any questions you have with your health care provider. Document Released: 05/12/2005 Document Revised: 01/20/2018 Document Reviewed: 01/20/2018 Elsevier Patient Education  2020 [employer].

## 2019-05-26 LAB — COMPREHENSIVE METABOLIC PANEL
ALT: 23 IU/L (ref 0–44)
AST: 30 IU/L (ref 0–40)
Albumin/Globulin Ratio: 1.5 (ref 1.2–2.2)
Albumin: 4.6 g/dL (ref 4.0–5.0)
Alkaline Phosphatase: 79 IU/L (ref 39–117)
BUN/Creatinine Ratio: 14 (ref 9–20)
BUN: 15 mg/dL (ref 6–24)
Bilirubin Total: 0.2 mg/dL (ref 0.0–1.2)
CO2: 23 mmol/L (ref 20–29)
Calcium: 9.7 mg/dL (ref 8.7–10.2)
Chloride: 101 mmol/L (ref 96–106)
Creatinine, Ser: 1.1 mg/dL (ref 0.76–1.27)
GFR calc Af Amer: 90 mL/min/{1.73_m2} (ref >59)
GFR calc non Af Amer: 78 mL/min/{1.73_m2} (ref >59)
Globulin, Total: 3 g/dL (ref 1.5–4.5)
Glucose: 106 mg/dL — ABNORMAL HIGH (ref 65–99)
Potassium: 5.4 mmol/L — ABNORMAL HIGH (ref 3.5–5.2)
Sodium: 141 mmol/L (ref 134–144)
Total Protein: 7.6 g/dL (ref 6.0–8.5)

## 2019-05-26 LAB — CBC WITH DIFFERENTIAL/PLATELET
Basophils Absolute: 0 10*3/uL (ref 0.0–0.2)
Basos: 0 %
EOS (ABSOLUTE): 0.2 10*3/uL (ref 0.0–0.4)
Eos: 3 %
Hematocrit: 48 % (ref 37.5–51.0)
Hemoglobin: 16.1 g/dL (ref 13.0–17.7)
Immature Grans (Abs): 0 10*3/uL (ref 0.0–0.1)
Immature Granulocytes: 0 %
Lymphocytes Absolute: 2.9 10*3/uL (ref 0.7–3.1)
Lymphs: 40 %
MCH: 28.3 pg (ref 26.6–33.0)
MCHC: 33.5 g/dL (ref 31.5–35.7)
MCV: 84 fL (ref 79–97)
Monocytes Absolute: 0.4 10*3/uL (ref 0.1–0.9)
Monocytes: 6 %
Neutrophils Absolute: 3.6 10*3/uL (ref 1.4–7.0)
Neutrophils: 51 %
Platelets: 257 10*3/uL (ref 150–450)
RBC: 5.69 x10E6/uL (ref 4.14–5.80)
RDW: 12 % (ref 11.6–15.4)
WBC: 7.1 10*3/uL (ref 3.4–10.8)

## 2019-05-26 LAB — TSH: TSH: 1.49 u[IU]/mL (ref 0.450–4.500)

## 2019-05-26 LAB — H. PYLORI BREATH TEST: H pylori Breath Test: NEGATIVE

## 2019-05-31 ENCOUNTER — Ambulatory Visit (INDEPENDENT_AMBULATORY_CARE_PROVIDER_SITE_OTHER): Payer: No Typology Code available for payment source | Admitting: Cardiovascular Disease

## 2019-05-31 ENCOUNTER — Telehealth: Payer: Self-pay | Admitting: *Deleted

## 2019-05-31 ENCOUNTER — Other Ambulatory Visit: Payer: Self-pay

## 2019-05-31 ENCOUNTER — Encounter: Payer: Self-pay | Admitting: Cardiovascular Disease

## 2019-05-31 ENCOUNTER — Other Ambulatory Visit: Payer: Self-pay | Admitting: Physician Assistant

## 2019-05-31 DIAGNOSIS — I10 Essential (primary) hypertension: Secondary | ICD-10-CM

## 2019-05-31 DIAGNOSIS — R072 Precordial pain: Secondary | ICD-10-CM

## 2019-05-31 DIAGNOSIS — E782 Mixed hyperlipidemia: Secondary | ICD-10-CM

## 2019-05-31 DIAGNOSIS — E875 Hyperkalemia: Secondary | ICD-10-CM

## 2019-05-31 DIAGNOSIS — F17200 Nicotine dependence, unspecified, uncomplicated: Secondary | ICD-10-CM

## 2019-05-31 NOTE — Progress Notes (Signed)
05/31/2019 CASHE SOLAN   10-09-1970  [id number]  Primary Physician Scot Jun, FNP Primary Cardiologist: Lorretta Harp MD Garret Redd[facility], [place], [place]  HPI:  [name] is a 49 y.o. 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  He was referred by his PCP for evaluation of atypical chest pain.  His cardiac risk factor profile is notable for 20 pack years of tobacco abuse continuing to smoke three quarters of a pack a day recalcitrant to respect modification.  Does have a history of esophagogastritis and has undergone multiple EGDs by Dr. Buford Dresser in [place].  He has treated hypertension hyperlipidemia.  Never had a heart tach or stroke.  He was seen in the emergency room on 05/04/2019 with atypical chest pain.  He ruled out for myocardial infarction.  His EKG was nonacute and his enzymes were low and flat.   Current Meds  Medication Sig  . aspirin EC 81 MG tablet Take 1 tablet (81 mg total) by mouth daily.  . cetirizine (ZYRTEC) 10 MG tablet Take 1 tablet (10 mg total) by mouth daily.  . fluticasone (FLONASE) 50 MCG/ACT nasal spray Place 1 spray into both nostrils daily.  [name] lisinopril-hydrochlorothiazide (ZESTORETIC) 20-25 MG tablet Take 1 tablet by mouth daily.  [name] omeprazole (PRILOSEC) 40 MG capsule Take 1 capsule (40 mg total) by mouth daily.     No Known Allergies  Social History   Socioeconomic History  . Marital status: Single    Spouse name: Not on file  . Number of children: Not on file  . Years of education: Not on file  . Highest education level: Not on file  Occupational History  . Occupation: Scientist, physiological: [employer]    Comment: IT sales professional in [employer]  . Smoking status: Current Every Day Smoker    Packs/day: 0.25    Years: 10.00    Pack years: 2.50    Types: Cigarettes  . Smokeless tobacco: Never Used   Substance and Sexual Activity  . Alcohol use: Yes    Alcohol/week: 0.0 standard drinks    Comment: occasional/wine on the weekends  . Drug use: No  . Sexual activity: Not on file  Other Topics Concern  . Not on file  Social History Narrative  . Not on file   Social Determinants of Health   Financial Resource Strain:   . Difficulty of Paying Living Expenses: Not on file  Food Insecurity:   . Worried About Charity fundraiser in the Last Year: Not on file  . Ran Out of Food in the Last Year: Not on file  Transportation Needs:   . Lack of Transportation (Medical): Not on file  . Lack of Transportation (Non-Medical): Not on file  Physical Activity:   . Days of Exercise per Week: Not on file  . Minutes of Exercise per Session: Not on file  Stress:   . Feeling of Stress : Not on file  Social Connections:   . Frequency of Communication with Friends and Family: Not on file  . Frequency of Social Gatherings with Friends and Family: Not on file  . Attends Religious Services: Not on file  . Active Member of Clubs or Organizations: Not on file  . Attends Archivist Meetings: Not on file  . Marital Status: Not on file  Intimate Partner Violence:   .  Fear of Current or Ex-Partner: Not on file  . Emotionally Abused: Not on file  . Physically Abused: Not on file  . Sexually Abused: Not on file     Review of Systems: General: negative for chills, fever, night sweats or weight changes.  Cardiovascular: negative for chest pain, dyspnea on exertion, edema, orthopnea, palpitations, paroxysmal nocturnal dyspnea or shortness of breath Dermatological: negative for rash Respiratory: negative for cough or wheezing Urologic: negative for hematuria Abdominal: negative for nausea, vomiting, diarrhea, bright red blood per rectum, melena, or hematemesis Neurologic: negative for visual changes, syncope, or dizziness All other systems reviewed and are otherwise negative except as noted  above.    Blood pressure (!) 141/92, pulse 97, temperature (!) 97.4 F (36.3 C), height 5\' 8"  (1.727 m), weight 237 lb (107.5 kg), SpO2 98 %.  General appearance: alert and no distress Neck: no adenopathy, no carotid bruit, no JVD, supple, symmetrical, trachea midline and thyroid not enlarged, symmetric, no tenderness/mass/nodules Lungs: clear to auscultation bilaterally Heart: regular rate and rhythm, S1, S2 normal, no murmur, click, rub or gallop Extremities: extremities normal, atraumatic, no cyanosis or edema Pulses: 2+ and symmetric Skin: Skin color, texture, turgor normal. No rashes or lesions Neurologic: Alert and oriented X 3, normal strength and tone. Normal symmetric reflexes. Normal coordination and gait  EKG not performed today  ASSESSMENT AND PLAN:   TOBACCO ABUSE History of ongoing tobacco abuse of three quarters of a pack a day for last 25 years.  Essential hypertension History essential hypertension with blood pressure measured today 141/92.  He is on lisinopril hydrochlorothiazide.  Hyperlipidemia History of hyperlipidemia currently not on statin therapy with lipid profile performed 11/17/2018 revealing total cholesterol of 241, LDL 142 and HDL 50.  We will recheck a lipid liver profile.  Atypical chest pain History of atypical chest pain with a recent ER visit 05/04/2019 which was unrevealing.  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  His enzymes were low and flat.  I am going check a coronary calcium score to further evaluate.      Lorretta Harp MD FACP,FACC,FAHA, [facility] 05/31/2019 11:08 AM

## 2019-05-31 NOTE — Assessment & Plan Note (Signed)
History of ongoing tobacco abuse of three quarters of a pack a day for last 25 years.

## 2019-05-31 NOTE — Assessment & Plan Note (Addendum)
History essential hypertension with blood pressure measured today 141/92.  Subsequent blood pressure in the office was 138/84.  He is on lisinopril hydrochlorothiazide.

## 2019-05-31 NOTE — Patient Instructions (Signed)
Medication Instructions:  Your physician recommends that you continue on your current medications as directed. Please refer to the Current Medication list given to you today.  If you need a refill on your cardiac medications before your next appointment, please call your pharmacy.   Lab work: Fasting Lipids and Hepatic Function If you have labs (blood work) drawn today and your tests are completely normal, you will receive your results only by: MyChart Message (if you have MyChart) OR A paper copy in the mail If you have any lab test that is abnormal or we need to change your treatment, we will call you to review the results.  Testing/Procedures: Coronary Calcium Score  Follow-Up: At [facility], you and your health needs are our priority.  As part of our continuing mission to provide you with exceptional heart care, we have created designated Provider Care Teams.  These Care Teams include your primary Cardiologist (physician) and Advanced Practice Providers (APPs -  Physician Assistants and Nurse Practitioners) who all work together to provide you with the care you need, when you need it. You may see Dr [name] or one of the following Advanced Practice Providers on your designated Care Team:    Kerin Ransom, PA-C  [place], [place]  Coletta Memos,   Your physician wants you to follow-up as needed.  Any Other Special Instructions Will Be Listed Below (If Applicable).  Coronary Calcium Scan A coronary calcium scan is an imaging test used to look for deposits of plaque in the inner lining of the blood vessels of the heart (coronary arteries). Plaque is made up of calcium, protein, and fatty substances. These deposits of plaque can partly clog and narrow the coronary arteries without producing any symptoms or warning signs. This puts a person at risk for a heart attack. This test is recommended for people who are at moderate risk for heart disease. The test can find plaque deposits  before symptoms develop. Tell a health care provider about:  Any allergies you have.  All medicines you are taking, including vitamins, herbs, eye drops, creams, and over-the-counter medicines.  Any problems you or family members have had with anesthetic medicines.  Any blood disorders you have.  Any surgeries you have had.  Any medical conditions you have.  Whether you are pregnant or may be pregnant. What are the risks? Generally, this is a safe procedure. However, problems may occur, including:  Harm to a pregnant woman and her unborn baby. This test involves the use of radiation. Radiation exposure can be dangerous to a pregnant woman and her unborn baby. If you are pregnant or think you may be pregnant, you should not have this procedure done.  Slight increase in the risk of cancer. This is because of the radiation involved in the test. What happens before the procedure? Ask your health care provider for any specific instructions on how to prepare for this procedure. You may be asked to avoid products that contain caffeine, tobacco, or nicotine for 4 hours before the procedure. What happens during the procedure?   You will undress and remove any jewelry from your neck or chest.  You will put on a hospital gown.  Sticky electrodes will be placed on your chest. The electrodes will be connected to an electrocardiogram (ECG) machine to record a tracing of the electrical activity of your heart.  You will lie down on a curved bed that is attached to the [place].  You may be given medicine to slow  down your heart rate so that clear pictures can be created.  You will be moved into the CT scanner, and the CT scanner will take pictures of your heart. During this time, you will be asked to lie still and hold your breath for 2-3 seconds at a time while each picture of your heart is being taken. The procedure may vary among health care providers and hospitals. What happens after the  procedure?  You can get dressed.  You can return to your normal activities.  It is up to you to get the results of your procedure. Ask your health care provider, or the department that is doing the procedure, when your results will be ready. Summary  A coronary calcium scan is an imaging test used to look for deposits of plaque in the inner lining of the blood vessels of the heart (coronary arteries). Plaque is made up of calcium, protein, and fatty substances.  Generally, this is a safe procedure. Tell your health care provider if you are pregnant or may be pregnant.  Ask your health care provider for any specific instructions on how to prepare for this procedure.  A CT scanner will take pictures of your heart.  You can return to your normal activities after the scan is done. This information is not intended to replace advice given to you by your health care provider. Make sure you discuss any questions you have with your health care provider. Document Revised: 11/30/2018 Document Reviewed: 11/30/2018 Elsevier Patient Education  [employer].

## 2019-05-31 NOTE — Telephone Encounter (Signed)
Medical Assistant left message on patient's home and cell voicemail. Voicemail states to give a call back to Dat Derksen with [facility] at [phone].  

## 2019-05-31 NOTE — Assessment & Plan Note (Signed)
History of hyperlipidemia currently not on statin therapy with lipid profile performed 11/17/2018 revealing total cholesterol of 241, LDL 142 and HDL 50.  We will recheck a lipid liver profile.

## 2019-05-31 NOTE — Assessment & Plan Note (Signed)
History of atypical chest pain with a recent ER visit 05/04/2019 which was unrevealing.  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  His enzymes were low and flat.  I am going check a coronary calcium score to further evaluate.

## 2019-05-31 NOTE — Telephone Encounter (Signed)
-----   Message from [place], [place] sent at 05/31/2019  7:06 AM EST ----- Please call patient.  Your potassium is elevated;  this can lead to heart abnormalities. The new blood pressure medicine I put you on should help. Stop any over the counter potassium supplements you may be taking.  Drink 60-80 ounces of water daily. Your stomach ulcer test was negative.  Your blood sugar was a little elevated so eat less sugar and white carbohydrates.  Your blood count is normal.  Kidney and liver function are normal. Make a lab appointment for this week or next week and I will order the test to recheck your potassium.  If you develop worsening chest pain or palpitations call 911 or go to the emergency department as previously discussed. Thanks, Freeman Caldron, PA-C

## 2019-06-02 ENCOUNTER — Other Ambulatory Visit: Payer: Self-pay

## 2019-06-02 ENCOUNTER — Ambulatory Visit (INDEPENDENT_AMBULATORY_CARE_PROVIDER_SITE_OTHER)
Admission: RE | Admit: 2019-06-02 | Discharge: 2019-06-02 | Disposition: A | Discharge status: Home / Self Care | Payer: Self-pay | Source: Ambulatory Visit | Attending: Cardiovascular Disease | Admitting: Cardiovascular Disease

## 2019-06-02 DIAGNOSIS — R072 Precordial pain: Secondary | ICD-10-CM

## 2019-06-03 ENCOUNTER — Telehealth (INDEPENDENT_AMBULATORY_CARE_PROVIDER_SITE_OTHER): Payer: Self-pay

## 2019-06-03 NOTE — Telephone Encounter (Signed)
Patient verified date of birth. He is aware that potassium is elevated and can lead to heart abnormalities. The new blood pressure medication should help. Advised to stop taking any over the counter potassium supplement he may be taking and drink 60-80 ounces of water daily. Patient is aware that test for stomach ulcers was negative. He was informed that blood sugars were a little elevated; advised to eat less sugar and white carbohydrates. Blood count, kidney and liver function are all normal. Patient instructed to make lab appointment for next week to recheck potassium. Advised to call 911 or go to emergency department should he develop worsening chest pain or palpitations. Reminded patient of upcoming appointment date and times. He verbalized understanding of all results. Nat Christen, CMA

## 2019-06-03 NOTE — Telephone Encounter (Signed)
-----   Message from [place], [place] sent at 05/31/2019  7:06 AM EST ----- Please call patient.  Your potassium is elevated;  this can lead to heart abnormalities. The new blood pressure medicine I put you on should help. Stop any over the counter potassium supplements you may be taking.  Drink 60-80 ounces of water daily. Your stomach ulcer test was negative.  Your blood sugar was a little elevated so eat less sugar and white carbohydrates.  Your blood count is normal.  Kidney and liver function are normal. Make a lab appointment for this week or next week and I will order the test to recheck your potassium.  If you develop worsening chest pain or palpitations call 911 or go to the emergency department as previously discussed. Thanks, Freeman Caldron, PA-C

## 2019-06-09 ENCOUNTER — Ambulatory Visit (INDEPENDENT_AMBULATORY_CARE_PROVIDER_SITE_OTHER): Payer: Self-pay | Admitting: Family Medicine

## 2019-06-09 DIAGNOSIS — E875 Hyperkalemia: Secondary | ICD-10-CM

## 2019-06-09 DIAGNOSIS — R600 Localized edema: Secondary | ICD-10-CM

## 2019-06-09 DIAGNOSIS — I10 Essential (primary) hypertension: Secondary | ICD-10-CM

## 2019-06-09 DIAGNOSIS — E785 Hyperlipidemia, unspecified: Secondary | ICD-10-CM

## 2019-06-09 NOTE — Progress Notes (Signed)
Virtual Visit via Telephone Note  I connected with [name], on 06/09/2019 at 8:30 AM by telephone due to the COVID-19 pandemic and verified that I am speaking with the correct person using two identifiers.   Consent: I discussed the limitations, risks, security and privacy concerns of performing an evaluation and management service by telephone and the availability of in person appointments. I also discussed with the patient that there may be a patient responsible charge related to this service. The patient expressed understanding and agreed to proceed.   Location of Patient: Home  Location of Provider: Clinic   Persons participating in Telemedicine visit: Jacalyn Lefevre walker-CMA Dr. Margarita Rana     History of Present Illness: [name] is a 49 year old male with a history of Hypertension, tobacco abuse, Hyperlipidemia here for a follow up visit.   He has nausea every now and then and is on a PPI for Gastritis. Also has pedal edema. Had headaches from sinusitis which he states are now better. Blood pressures at home have been normal he states. His Lisinopril was changed to Lisinopril/HCTZ at his visit with the PA on 05/26/19, K at the time was 5.4. Seen by Cardiology for chest pain ( he had also had an ED visit for atypical chest pain) and at visit BP was 141/92. Coronary calcium score was 0. He has had no chest pain recently.  Past Medical History:  Diagnosis Date  . GERD (gastroesophageal reflux disease)   . Hepatic cyst    Benign by MRI  . Hiatal hernia   . History of colonic polyps   . Hypertension   . Rectal bleeding    No Known Allergies  Current Outpatient Medications on File Prior to Visit  Medication Sig Dispense Refill  . aspirin EC 81 MG tablet Take 1 tablet (81 mg total) by mouth daily. 100 tablet 3  . cetirizine (ZYRTEC) 10 MG tablet Take 1 tablet (10 mg total) by mouth daily. 30 tablet 3  . fluticasone (FLONASE) 50 MCG/ACT nasal spray Place  1 spray into both nostrils daily. 16 g 3  . lisinopril-hydrochlorothiazide (ZESTORETIC) 20-25 MG tablet Take 1 tablet by mouth daily. 90 tablet 3  . omeprazole (PRILOSEC) 40 MG capsule Take 1 capsule (40 mg total) by mouth daily. 30 capsule 3   No current facility-administered medications on file prior to visit.    Observations/Objective: Awake, alert, oriented x 3  Not n acute distress   CMP Latest Ref Rng & Units 05/25/2019 05/04/2019 02/17/2019  Glucose 65 - 99 mg/dL 106(H) 109(H) 97  BUN 6 - 24 mg/dL 15 16 15   Creatinine 0.76 - 1.27 mg/dL 1.10 1.12 1.13  Sodium 134 - 144 mmol/L 141 134(L) 136  Potassium 3.5 - 5.2 mmol/L 5.4(H) 4.5 4.5  Chloride 96 - 106 mmol/L 101 101 101  CO2 20 - 29 mmol/L 23 23 21   Calcium 8.7 - 10.2 mg/dL 9.7 9.5 9.8  Total Protein 6.0 - 8.5 g/dL 7.6 - -  Total Bilirubin 0.0 - 1.2 mg/dL 0.2 - -  Alkaline Phos 39 - 117 IU/L 79 - -  AST 0 - 40 IU/L 30 - -  ALT 0 - 44 IU/L 23 - -    Lipid Panel     Component Value Date/Time   CHOL 241 (H) 11/17/2018 1641   TRIG 245 (H) 11/17/2018 1641   HDL 50 11/17/2018 1641   CHOLHDL 4.8 11/17/2018 1641   CHOLHDL 8.5 06/23/2008 0810   VLDL  31 06/23/2008 0810   LDLCALC 142 (H) 11/17/2018 1641   LABVLDL 49 (H) 11/17/2018 1641      Assessment and Plan: 1. Essential hypertension Not fully optimized with BP of 141/92 at Cardiology visit No regimen today, will reassess at next visit Counseled on blood pressure goal of less than 130/80, low-sodium, DASH diet, medication compliance, 150 minutes of moderate intensity exercise per week. Discussed medication compliance, adverse effects. - Basic Metabolic Panel; Future  2. Pedal edema Counseled on elevation, low sodium diet Currently on a diuretic  3. Hyperlipidemia, unspecified hyperlipidemia type Not on a statin Lipid panel had been ordered by Cardiology Will need addition of statin  4. Hyperkalemia Likely secondary to ACEI Since addition of diuretic  hyperkalemia could have resolved - will check labs    Follow Up Instructions: 3 month   I discussed the assessment and treatment plan with the patient. The patient was provided an opportunity to ask questions and all were answered. The patient agreed with the plan and demonstrated an understanding of the instructions.   The patient was advised to call back or seek an in-person evaluation if the symptoms worsen or if the condition fails to improve as anticipated.     I provided 21 minutes total of non-face-to-face time during this encounter including median intraservice time, reviewing previous notes, investigations, ordering medications, medical decision making, coordinating care and patient verbalized understanding at the end of the visit.     Charlott Rakes, MD, FAAFP. [facility] [place], [place]   06/09/2019, 8:30 AM

## 2019-06-10 ENCOUNTER — Other Ambulatory Visit: Payer: Self-pay

## 2019-06-10 DIAGNOSIS — F17200 Nicotine dependence, unspecified, uncomplicated: Secondary | ICD-10-CM

## 2019-06-10 DIAGNOSIS — I10 Essential (primary) hypertension: Secondary | ICD-10-CM

## 2019-06-10 DIAGNOSIS — E7800 Pure hypercholesterolemia, unspecified: Secondary | ICD-10-CM

## 2019-06-10 DIAGNOSIS — R7303 Prediabetes: Secondary | ICD-10-CM

## 2019-06-10 NOTE — Progress Notes (Signed)
Patient here for labs ordered by PCP & Cardiologist. Per request A1C added to labs to recheck prediabetes.

## 2019-06-11 LAB — HEPATIC FUNCTION PANEL
ALT: 44 IU/L (ref 0–44)
AST: 38 IU/L (ref 0–40)
Albumin: 4.7 g/dL (ref 4.0–5.0)
Alkaline Phosphatase: 80 IU/L (ref 39–117)
Bilirubin Total: 0.6 mg/dL (ref 0.0–1.2)
Bilirubin, Direct: 0.18 mg/dL (ref 0.00–0.40)
Total Protein: 8.1 g/dL (ref 6.0–8.5)

## 2019-06-11 LAB — BASIC METABOLIC PANEL
BUN/Creatinine Ratio: 11 (ref 9–20)
BUN: 13 mg/dL (ref 6–24)
CO2: 21 mmol/L (ref 20–29)
Calcium: 10 mg/dL (ref 8.7–10.2)
Chloride: 97 mmol/L (ref 96–106)
Creatinine, Ser: 1.21 mg/dL (ref 0.76–1.27)
GFR calc Af Amer: 80 mL/min/{1.73_m2} (ref >59)
GFR calc non Af Amer: 69 mL/min/{1.73_m2} (ref >59)
Glucose: 96 mg/dL (ref 65–99)
Potassium: 4.5 mmol/L (ref 3.5–5.2)
Sodium: 137 mmol/L (ref 134–144)

## 2019-06-11 LAB — LIPID PANEL
Chol/HDL Ratio: 4.6 ratio (ref 0.0–5.0)
Cholesterol, Total: 223 mg/dL — ABNORMAL HIGH (ref 100–199)
HDL: 49 mg/dL (ref >39)
LDL Chol Calc (NIH): 139 mg/dL — ABNORMAL HIGH (ref 0–99)
Triglycerides: 193 mg/dL — ABNORMAL HIGH (ref 0–149)
VLDL Cholesterol Cal: 35 mg/dL (ref 5–40)

## 2019-06-11 LAB — HEMOGLOBIN A1C
Est. average glucose Bld gHb Est-mCnc: 123 mg/dL
Hgb A1c MFr Bld: 5.9 % — ABNORMAL HIGH (ref 4.8–5.6)

## 2019-06-13 ENCOUNTER — Telehealth: Payer: Self-pay

## 2019-06-13 DIAGNOSIS — E782 Mixed hyperlipidemia: Secondary | ICD-10-CM

## 2019-06-13 MED ORDER — ATORVASTATIN CALCIUM 20 MG PO TABS: 20.0000 mg | ORAL_TABLET | Freq: Every day | ORAL | 3 refills | Status: DC

## 2019-06-13 NOTE — Progress Notes (Signed)
Seen by patient [name] on 06/13/2019  9:08 AM EST

## 2019-06-13 NOTE — Telephone Encounter (Signed)
Called and gave pt lab results. Put in new orders. Verbalized understanding.

## 2019-06-29 ENCOUNTER — Ambulatory Visit: Payer: No Typology Code available for payment source

## 2019-08-17 ENCOUNTER — Ambulatory Visit: Payer: Self-pay | Admitting: Internal Medicine

## 2019-09-01 ENCOUNTER — Ambulatory Visit: Payer: Self-pay | Attending: Internal Medicine

## 2019-09-01 DIAGNOSIS — Z23 Encounter for immunization: Secondary | ICD-10-CM

## 2019-09-01 NOTE — Progress Notes (Signed)
   Covid-19 Vaccination Clinic  Name:  [name]    MRN: [id number] DOB: [birth year]  09/01/2019  [name] was observed post Covid-19 immunization for 15 minutes without incident. He was provided with Vaccine Information Sheet and instruction to access the V-Safe system.   [name] was instructed to call 911 with any severe reactions post vaccine: [name] Difficulty breathing  . Swelling of face and throat  . A fast heartbeat  . A bad rash all over body  . Dizziness and weakness   Immunizations Administered    Name Date Dose VIS Date Route   Pfizer COVID-19 Vaccine 09/01/2019  9:28 AM 0.3 mL 05/06/2019 Intramuscular   Manufacturer: [place]   Lot: [id number]   [place]: [id number]

## 2019-09-05 ENCOUNTER — Telehealth: Payer: Self-pay

## 2019-09-05 NOTE — Telephone Encounter (Signed)
Called patient to do their pre-visit COVID screening.  Call went to voicemail. Unable to do prescreening.  

## 2019-09-05 NOTE — Patient Instructions (Signed)

## 2019-09-06 ENCOUNTER — Ambulatory Visit (INDEPENDENT_AMBULATORY_CARE_PROVIDER_SITE_OTHER): Payer: Self-pay | Admitting: Internal Medicine

## 2019-09-06 ENCOUNTER — Other Ambulatory Visit: Payer: Self-pay

## 2019-09-06 ENCOUNTER — Encounter: Payer: Self-pay | Admitting: Internal Medicine

## 2019-09-06 VITALS — BP 150/95 | HR 106 | Temp 97.5°F | Resp 17 | Ht 68.0 in | Wt 251.0 lb

## 2019-09-06 DIAGNOSIS — J320 Chronic maxillary sinusitis: Secondary | ICD-10-CM

## 2019-09-06 DIAGNOSIS — R519 Headache, unspecified: Secondary | ICD-10-CM

## 2019-09-06 DIAGNOSIS — I10 Essential (primary) hypertension: Secondary | ICD-10-CM

## 2019-09-06 DIAGNOSIS — Z0000 Encounter for general adult medical examination without abnormal findings: Secondary | ICD-10-CM

## 2019-09-06 DIAGNOSIS — K219 Gastro-esophageal reflux disease without esophagitis: Secondary | ICD-10-CM

## 2019-09-06 DIAGNOSIS — Z125 Encounter for screening for malignant neoplasm of prostate: Secondary | ICD-10-CM

## 2019-09-06 MED ORDER — CETIRIZINE HCL 10 MG PO TABS: 10.0000 mg | ORAL_TABLET | Freq: Every day | ORAL | 3 refills | Status: DC

## 2019-09-06 MED ORDER — LISINOPRIL 40 MG PO TABS: 40.0000 mg | ORAL_TABLET | Freq: Every day | ORAL | 3 refills | Status: DC

## 2019-09-06 MED ORDER — HYDROCHLOROTHIAZIDE 25 MG PO TABS: 25.0000 mg | ORAL_TABLET | Freq: Every day | ORAL | 3 refills | Status: DC

## 2019-09-06 MED ORDER — OMEPRAZOLE 40 MG PO CPDR: 40.0000 mg | DELAYED_RELEASE_CAPSULE | Freq: Every day | ORAL | 3 refills | Status: DC

## 2019-09-06 NOTE — Progress Notes (Signed)
Subjective:    [name] - 49 y.o. male MRN [id number]  Date of birth: Mar 12, 1971  HPI  [name] is here for annual exam. Also has some acute concerns.   Patient reports that he has pressure in the front of his face, on the side of his head. Does not describe it as a headache but instead as pressure. He takes Zyrtec and Flonase without really any changes in his symptoms. He says he just never feels normal. It has been going on for a while now. It happens 85% of each week per patient report. No vision changes. Endorses nausea with this. No extremity weakness. Sometimes feels tingling diffusely throughout his entire upper body. No family history of migraines. No personal h/o migraines. Reports he's never really had a lot of headaches.   Chronic HTN Disease Monitoring:  Home BP Monitoring - Reports he will monitor at [facility] occasionally and reports it stays slightly high like it is today.  Chest pain- no  Dyspnea- no Headache - yes, see above   Medications: Lisinopril 20-HCTZ 25 mg  Compliance- yes Lightheadedness- no  Edema- no    Health Maintenance:  There are no preventive care reminders to display for this patient.  -  reports that he has been smoking cigarettes. He has a 2.50 pack-year smoking history. He has never used smokeless tobacco. - Review of Systems: Per HPI. - Past Medical History: Patient Active Problem List   Diagnosis Date Noted  . Hyperlipidemia 02/17/2019  . Lesion of liver 09/23/2017  . Family history of colon cancer 05/01/2016  . Atypical chest pain 05/01/2016  . Abnormal ultrasound 07/19/2015  . Erosive esophagitis 04/11/2014  . Hx of adenomatous colonic polyps 04/11/2014  . Abdominal pain 06/01/2012  . Rectal bleeding 06/01/2012  . Constipation 06/01/2012  . ALCOHOL ABUSE 09/06/2008  . TOBACCO ABUSE 09/06/2008  . Essential hypertension 09/06/2008  . GASTROESOPHAGEAL REFLUX DISEASE, CHRONIC 09/06/2008  . CONSTIPATION,  CHRONIC 09/06/2008  . DYSPHAGIA UNSPECIFIED 09/06/2008   - Medications: reviewed and updated   Objective:   Physical Exam BP (!) 150/95   Pulse (!) 106   Temp (!) 97.5 F (36.4 C) (Temporal)   Resp 17   Ht 5\' 8"  (1.727 m)   Wt 251 lb (113.9 kg)   SpO2 96%   BMI 38.16 kg/m  Physical Exam  Constitutional: He is oriented to person, place, and time and well-developed, well-nourished, and in no distress. No distress.  HENT:  Head: Normocephalic and atraumatic.  Eyes: Conjunctivae and EOM are normal.  Cardiovascular: Normal rate and regular rhythm.  Pulmonary/Chest: Effort normal and breath sounds normal. No respiratory distress.  Musculoskeletal:        General: Normal range of motion.  Neurological: He is alert and oriented to person, place, and time. No cranial nerve deficit. He exhibits normal muscle tone. Coordination normal.  No speech abnormalities.   Skin: Skin is warm and dry. He is not diaphoretic.  Psychiatric: Affect and judgment normal.        Assessment & Plan:   1. Annual physical exam - CBC with Differential  2. Screening PSA (prostate specific antigen) Discussed risks and benefits of screening. His brother currently has stage III prostate cancer and he would like to proceed with screening.  - PSA  3. Gastroesophageal reflux disease without esophagitis - omeprazole (PRILOSEC) 40 MG capsule; Take 1 capsule (40 mg total) by mouth daily.  Dispense: 30 capsule; Refill: 3  4. Chronic maxillary sinusitis - cetirizine (  ZYRTEC) 10 MG tablet; Take 1 tablet (10 mg total) by mouth daily.  Dispense: 30 tablet; Refill: 3  5. Pressure in head Differential includes migraine variant, cervical nerve compression, tension headache, referred pain from chronic sinusitis, cranial mass. Given age of 67 with new onset of complaints, suspect patient would warrant imaging of the brain. Could also consider imaging of the neck for disc herniation or DJD as etiology of symptoms. Lower  suspicion for CVA as patient does not have any neuro deficits and symptoms are prolonged in course. Will refer to neurology for evaluation and imaging.  - Ambulatory referral to Neurology  6. Essential hypertension BP elevated today and has been on prior trend. Will increase Lisinopril to 40 mg daily. Continue HCTZ at current dose. Follow up in 2 weeks for BP check and BMET with medication change.  Counseled on blood pressure goal of less than 130/80, low-sodium, DASH diet, medication compliance, 150 minutes of moderate intensity exercise per week. Discussed medication compliance, adverse effects. - lisinopril (ZESTRIL) 40 MG tablet; Take 1 tablet (40 mg total) by mouth daily.  Dispense: 90 tablet; Refill: 3 - hydrochlorothiazide (HYDRODIURIL) 25 MG tablet; Take 1 tablet (25 mg total) by mouth daily.  Dispense: 90 tablet; Refill: 3 - Basic metabolic panel; Future    Phill Myron, D.O. 09/06/2019, 3:05 PM Primary Care at [facility]

## 2019-09-07 LAB — CBC WITH DIFFERENTIAL/PLATELET
Basophils Absolute: 0.1 10*3/uL (ref 0.0–0.2)
Basos: 1 %
EOS (ABSOLUTE): 0.1 10*3/uL (ref 0.0–0.4)
Eos: 1 %
Hematocrit: 45.9 % (ref 37.5–51.0)
Hemoglobin: 15.4 g/dL (ref 13.0–17.7)
Immature Grans (Abs): 0 10*3/uL (ref 0.0–0.1)
Immature Granulocytes: 0 %
Lymphocytes Absolute: 2.9 10*3/uL (ref 0.7–3.1)
Lymphs: 32 %
MCH: 29.1 pg (ref 26.6–33.0)
MCHC: 33.6 g/dL (ref 31.5–35.7)
MCV: 87 fL (ref 79–97)
Monocytes Absolute: 0.6 10*3/uL (ref 0.1–0.9)
Monocytes: 7 %
Neutrophils Absolute: 5.5 10*3/uL (ref 1.4–7.0)
Neutrophils: 59 %
Platelets: 286 10*3/uL (ref 150–450)
RBC: 5.29 x10E6/uL (ref 4.14–5.80)
RDW: 12.4 % (ref 11.6–15.4)
WBC: 9.2 10*3/uL (ref 3.4–10.8)

## 2019-09-07 LAB — PSA: Prostate Specific Ag, Serum: 1.5 ng/mL (ref 0.0–4.0)

## 2019-09-07 NOTE — Progress Notes (Signed)
Normal lab letter mailed.

## 2019-09-15 ENCOUNTER — Other Ambulatory Visit: Payer: Self-pay

## 2019-09-15 ENCOUNTER — Ambulatory Visit: Payer: No Typology Code available for payment source | Admitting: Neurology

## 2019-09-15 ENCOUNTER — Encounter: Payer: Self-pay | Admitting: Neurology

## 2019-09-15 VITALS — BP 142/96 | HR 93 | Ht 69.0 in | Wt 255.6 lb

## 2019-09-15 DIAGNOSIS — R400 Somnolence: Secondary | ICD-10-CM

## 2019-09-15 DIAGNOSIS — M7918 Myalgia, other site: Secondary | ICD-10-CM

## 2019-09-15 DIAGNOSIS — G4489 Other headache syndrome: Secondary | ICD-10-CM | POA: Diagnosis not present

## 2019-09-15 MED ORDER — CYCLOBENZAPRINE HCL 10 MG PO TABS: 10.0000 mg | ORAL_TABLET | Freq: Every day | ORAL | 3 refills | Status: DC

## 2019-09-15 NOTE — Progress Notes (Signed)
NEUROLOGY CONSULTATION NOTE  [name] MRN: [id number] DOB: 04-19-1971  Referring provider: Nicolette Bang, DO Primary care provider: Nicolette Bang, DO  Reason for consult:  Head pressure  HISTORY OF PRESENT ILLNESS: [name] is a 49 year old right-handed black male with HTN who presents for head pressure.  History supplemented by ED and referring provider's notes.  About 5 or 6 months ago, he started experiencing moderate to severe pressure-like pain from both shoulders up his neck into back of head and may radiate into the temples and front of head.  It occurs spontaneously and may last all day and occurs daily.  No radiating pain or numbness down the arms.  Sometimes he wakes up and may have some numbness in the hands.  It is not aggravated by movement/activity or exertion.  He reports nausea once or twice a week.  Also he may see blurred lines in his vision which occurs every other week lasting 15-20 minutes.  No associated dizziness but sometimes if he bends over quickly he might feel brief dizziness.  No photophobia or phonophobia.  Applying ice to back of neck is ineffective.  He is not treating it with any analgesics.  He presented to the ED on 05/04/2019 after experiencing some associated chest tightness with the head pressure which had resolved once he was in the ED.  Workup including CBC, CMP, troponins, EKG and CXR, were unremarkable.  He has had prior head CTs to evaluate dizziness and headache on  09/18/2009, 08/10/2010, 05/09/2015, and 12/17/2015, which were all personally reviewed and were unremarkable.  However, he reports these current headaches are different.  Current NSAIDS:  ASA 81mg  daily Current analgesics:  none Current triptans:  none Current ergotamine:  none Current anti-emetic:  none Current muscle relaxants:  none Current anti-anxiolytic:  none Current sleep aide:  none Current Antihypertensive medications:  Lisinopril;  HCTZ Current Antidepressant medications:  none Current Anticonvulsant medications:  none Current anti-CGRP:  none Current Vitamins/Herbal/Supplements:  none Current Antihistamines/Decongestants:  Zyrtec; Flonase Other therapy:  Cold compress to back of neck Hormone/birth control:  none Other medications:  Atorvastatin; omeprazole  Past NSAIDS:  ibuprofen Past analgesics:  acetaminophen Past abortive triptans:  none Past abortive ergotamine:  none Past muscle relaxants:  cyclobenzaprine Past anti-emetic:  none Past antihypertensive medications:  none Past antidepressant medications:  none Past anticonvulsant medications:  none Past anti-CGRP:  none Past vitamins/Herbal/Supplements:  none Past antihistamines/decongestants:  none Other past therapies:  noen  Caffeine:  1 cup of coffee some mornings Sleep:  Good.  Snores.  Some daytime fatigue.      PAST MEDICAL HISTORY: Past Medical History:  Diagnosis Date  . GERD (gastroesophageal reflux disease)   . Hepatic cyst    Benign by MRI  . Hiatal hernia   . History of colonic polyps   . Hypertension   . Rectal bleeding     PAST SURGICAL HISTORY: Past Surgical History:  Procedure Laterality Date  . COLONOSCOPY  01/10/2009     RMR: Normal rectum, normal colon; repeat in 2015 due to [place] of colon cancer  . COLONOSCOPY N/A 01/09/2014   [id number] colonic polyps-removed as described  . COLONOSCOPY N/A 09/18/2016   Procedure: COLONOSCOPY;  Surgeon: Daneil Dolin, MD;  [facility];  Service: Endoscopy;  Laterality: N/A;  730   . ESOPHAGOGASTRODUODENOSCOPY  10/04/2007   RMR: Distal esophageal erosions consistent with erosive reflux esophagitis, patulous gastroesophageal junction status post passage of a  Maloney dilator, 16 [place].  Otherwise, unremarkable esophagus.  Hiatal hernia.  Otherwise normal stomach.  Bulbar erosion  . ESOPHAGOGASTRODUODENOSCOPY N/A 01/09/2014   Erosive reflux esophagitis. Small hiatal hernia   . I & D EXTREMITY Left 07/26/2018   Procedure: IRRIGATION AND DEBRIDEMENT EXTREMITY;  Surgeon: Dayna Barker, MD;  Location: [facility];  Service: Plastics;  Laterality: Left;  . PERCUTANEOUS PINNING Left 07/26/2018   Procedure: PERCUTANEOUS PINNING EXTREMITY;  Surgeon: Dayna Barker, MD;  Location: [facility];  Service: Plastics;  Laterality: Left;    MEDICATIONS: Current Outpatient Medications on File Prior to Visit  Medication Sig Dispense Refill  . aspirin EC 81 MG tablet Take 1 tablet (81 mg total) by mouth daily. 100 tablet 3  . atorvastatin (LIPITOR) 20 MG tablet Take 1 tablet (20 mg total) by mouth daily. 90 tablet 3  . cetirizine (ZYRTEC) 10 MG tablet Take 1 tablet (10 mg total) by mouth daily. 30 tablet 3  . fluticasone (FLONASE) 50 MCG/ACT nasal spray Place 1 spray into both nostrils daily. 16 g 3  . hydrochlorothiazide (HYDRODIURIL) 25 MG tablet Take 1 tablet (25 mg total) by mouth daily. 90 tablet 3  . lisinopril (ZESTRIL) 40 MG tablet Take 1 tablet (40 mg total) by mouth daily. 90 tablet 3  . lisinopril-hydrochlorothiazide (ZESTORETIC) 20-25 MG tablet Take 1 tablet by mouth daily. 90 tablet 3  . omeprazole (PRILOSEC) 40 MG capsule Take 1 capsule (40 mg total) by mouth daily. 30 capsule 3   No current facility-administered medications on file prior to visit.    ALLERGIES: No Known Allergies  FAMILY HISTORY: Family History  Problem Relation Age of Onset  . Colon cancer Sister        passed away from colon cancer, in her 4s  . Heart disease Mother   . Hypertension Mother   . Diabetes Father   . Hypertension Father   . Diabetes Other   . Heart attack Maternal Uncle     SOCIAL HISTORY: Social History   Socioeconomic History  . Marital status: Single    Spouse name: Not on file  . Number of children: Not on file  . Years of education: Not on file  . Highest education level: Not on file  Occupational History  . Occupation: Scientist, physiological: [employer]     Comment: IT sales professional in [employer]  . Smoking status: Current Every Day Smoker    Packs/day: 0.25    Years: 10.00    Pack years: 2.50    Types: Cigarettes  . Smokeless tobacco: Never Used  Substance and Sexual Activity  . Alcohol use: Yes    Alcohol/week: 0.0 standard drinks    Comment: occasional/wine on the weekends  . Drug use: No  . Sexual activity: Not on file  Other Topics Concern  . Not on file  Social History Narrative  . Not on file   Social Determinants of Health   Financial Resource Strain:   . Difficulty of Paying Living Expenses:   Food Insecurity:   . Worried About Charity fundraiser in the Last Year:   . Arboriculturist in the Last Year:   Transportation Needs:   . Film/video editor (Medical):   [name] Lack of Transportation (Non-Medical):   Physical Activity:   . Days of Exercise per Week:   . Minutes of Exercise per Session:   Stress:   . Feeling of Stress :   Social Connections:   .  Frequency of Communication with Friends and Family:   . Frequency of Social Gatherings with Friends and Family:   . Attends Religious Services:   . Active Member of Clubs or Organizations:   . Attends Archivist Meetings:   [name] Marital Status:   Intimate Partner Violence:   . Fear of Current or Ex-Partner:   . Emotionally Abused:   [name] Physically Abused:   . Sexually Abused:     REVIEW OF SYSTEMS: Constitutional: No fevers, chills, or sweats, no generalized fatigue, change in appetite Eyes: No visual changes, double vision, eye pain Ear, nose and throat: No hearing loss, ear pain, nasal congestion, sore throat Cardiovascular: No chest pain, palpitations Respiratory:  No shortness of breath at rest or with exertion, wheezes GastrointestinaI: No nausea, vomiting, diarrhea, abdominal pain, fecal incontinence Genitourinary:  No dysuria, urinary retention or frequency Musculoskeletal:  No neck pain, back pain Integumentary: No rash, pruritus,  skin lesions Neurological: as above Psychiatric: No depression, insomnia, anxiety Endocrine: No palpitations, fatigue, diaphoresis, mood swings, change in appetite, change in weight, increased thirst Hematologic/Lymphatic:  No purpura, petechiae. Allergic/Immunologic: no itchy/runny eyes, nasal congestion, recent allergic reactions, rashes  PHYSICAL EXAM: Blood pressure (!) 142/96, pulse 93, height 5\' 9"  (1.753 m), weight 255 lb 9.6 oz (115.9 kg), SpO2 99 %. General: No acute distress.  Patient appears well-groomed.  Head:  Normocephalic/atraumatic Eyes:  fundi examined but not visualized Neck: supple, bilateral suboccipital tenderness, left paraspinal tenderness, full range of motion Back: No paraspinal tenderness Heart: regular rate and rhythm Lungs: Clear to auscultation bilaterally. Vascular: No carotid bruits. Neurological Exam: Mental status: alert and oriented to person, place, and time, recent and remote memory intact, fund of knowledge intact, attention and concentration intact, speech fluent and not dysarthric, language intact. Cranial nerves: CN I: not tested CN II: pupils equal, round and reactive to light, visual fields intact CN III, IV, VI:  full range of motion, no nystagmus, no ptosis CN V: facial sensation intact CN VII: upper and lower face symmetric CN VIII: hearing intact CN IX, X: gag intact, uvula midline CN XI: sternocleidomastoid and trapezius muscles intact CN XII: tongue midline Bulk & Tone: normal, no fasciculations. Motor:  5/5 throughout  Sensation:  Pinprick and vibration sensation intact. Deep Tendon Reflexes:  2+ throughout, toes downgoing.  Finger to nose testing:  Without dysmetria.  Heel to shin:  Without dysmetria.  Gait:  Normal station and stride.  Able to turn and tandem walk. Romberg negative.  IMPRESSION: Headache likely related to cervical myofascial pain.  He does endorse some migraine features such as nausea and visual aura.   Neurologic exam unremarkable and with unremarkable head CTs, no red flag at this time that I believe warrants MRI.  I would first focus management on his neck pain, which is likely the generator of the headache.  He also endorses daytime somnolence although he says he sleeps well.  Consider obstructive sleep apnea, which may be a cause of headaches as well.  PLAN: 1.  Cyclobenzaprine 10mg  at bedtime 2.  Refer to physical therapy for neck pain. 3.  Refer for sleep study  4.  Follow up in 4 months.  Thank you for allowing me to take part in the care of this patient.  Metta Clines, DO  CC: Nicolette Bang, DO

## 2019-09-15 NOTE — Patient Instructions (Signed)
1.  Start cyclobenzaprine 10mg  at bedtime for neck pain/headache 2.  Refer to physical therapy for neck pain 3.  Refer for sleep study 4.  Follow up in 4 months.

## 2019-09-20 ENCOUNTER — Ambulatory Visit: Payer: Self-pay

## 2019-09-26 ENCOUNTER — Ambulatory Visit: Payer: No Typology Code available for payment source | Attending: Internal Medicine

## 2019-09-26 DIAGNOSIS — Z23 Encounter for immunization: Secondary | ICD-10-CM

## 2019-09-26 NOTE — Progress Notes (Signed)
   Covid-19 Vaccination Clinic  Name:  KAADEN BENCOSME    MRN: [id number] DOB: 08-31-1970  09/26/2019  Mr. Gangl was observed post Covid-19 immunization for 15 minutes without incident. He was provided with Vaccine Information Sheet and instruction to access the V-Safe system.   Mr. Oneal was instructed to call 911 with any severe reactions post vaccine: [name] Difficulty breathing  . Swelling of face and throat  . A fast heartbeat  . A bad rash all over body  . Dizziness and weakness   Immunizations Administered    Name Date Dose VIS Date Route   Pfizer COVID-19 Vaccine 09/26/2019  9:38 AM 0.3 mL 07/20/2018 Intramuscular   Manufacturer: [place]   Lot: [id number]   [place]: [id number]

## 2019-09-28 ENCOUNTER — Ambulatory Visit: Payer: No Typology Code available for payment source | Attending: Neurology | Admitting: Physical Therapy

## 2020-03-30 ENCOUNTER — Other Ambulatory Visit: Payer: Self-pay

## 2020-03-30 DIAGNOSIS — K219 Gastro-esophageal reflux disease without esophagitis: Secondary | ICD-10-CM

## 2020-03-30 MED ORDER — OMEPRAZOLE 40 MG PO CPDR: 40.0000 mg | DELAYED_RELEASE_CAPSULE | Freq: Every day | ORAL | 1 refills | Status: DC

## 2020-06-14 ENCOUNTER — Telehealth: Payer: Self-pay

## 2020-06-14 NOTE — Progress Notes (Signed)
Patient ID: DRAVON NOTT, male    DOB: 10/26/1970  MRN: [id number]  CC: Diabetes Screening and Back Pain  Subjective: [name] is a 50 y.o. male who presents for diabetes screening and back pain.  1. PRE-DIABETES FOLLOW-UP: Last A1C: 6.3% on 06/15/2020 Home Monitoring?  []  Yes    [x]  No Diet Adherence: [x]  Yes, trying Exercise: []  Yes    [x]  No Numbness of the feet? []  Yes    [x]  No Last eye exam: has not had one yet   2. BACK PAIN Location: bilateral lower back, left lower back worse than right lower back Onset:  1 month ago  Description: 8-9/10 Radiation: No Better with: [place]  Trauma: No Popping/clicking sounds with movement/bending: No Muscle spasms: No   Patient Active Problem List   Diagnosis Date Noted   Hyperlipidemia 02/17/2019   Lesion of liver 09/23/2017   Family history of colon cancer 05/01/2016   Atypical chest pain 05/01/2016   Abnormal ultrasound 07/19/2015   Erosive esophagitis 04/11/2014   Hx of adenomatous colonic polyps 04/11/2014   Abdominal pain 06/01/2012   Rectal bleeding 06/01/2012   Constipation 06/01/2012   ALCOHOL ABUSE 09/06/2008   TOBACCO ABUSE 09/06/2008   Essential hypertension 09/06/2008   GASTROESOPHAGEAL REFLUX DISEASE, CHRONIC 09/06/2008   CONSTIPATION, CHRONIC 09/06/2008   DYSPHAGIA UNSPECIFIED 09/06/2008     Current Outpatient Medications on File Prior to Visit  Medication Sig Dispense Refill   hydrochlorothiazide (HYDRODIURIL) 25 MG tablet Take 25 mg by mouth daily.     lisinopril (ZESTRIL) 40 MG tablet Take 40 mg by mouth daily.     omeprazole (PRILOSEC) 40 MG capsule Take 1 capsule (40 mg total) by mouth daily. 90 capsule 1   No current facility-administered medications on file prior to visit.    No Known Allergies  Social History   Socioeconomic History   Marital status: Single    Spouse name: Not on file   Number of children: Not on file   Years of education: Not on file    Highest education level: Not on file  Occupational History   Occupation: [employer]    Employer: [employer]: [employer] in [place] Use   Smoking status: Current Every Day Smoker    Packs/day: 0.25    Years: 10.00    Pack years: 2.50    Types: Cigarettes   Smokeless tobacco: Never Used  Scientific laboratory technician Use: Never used  Substance and Sexual Activity   Alcohol use: Yes    Alcohol/week: 0.0 standard drinks    Comment: occasional/wine on the weekends   Drug use: No   Sexual activity: Not on file  Other Topics Concern   Not on file  Social History Narrative   Right handed   Lives alone one story home   Social Determinants of Health   Financial Resource Strain: Not on file  Food Insecurity: Not on file  Transportation Needs: Not on file  Physical Activity: Not on file  Stress: Not on file  Social Connections: Not on file  Intimate Partner Violence: Not on file    Family History  Problem Relation Age of Onset   Colon cancer Sister        passed away from colon cancer, in her 66s   Heart disease Mother    Hypertension Mother    Diabetes Father    Hypertension Father    Diabetes Other    Heart  attack Maternal Uncle     Past Surgical History:  Procedure Laterality Date   COLONOSCOPY  01/10/2009     RMR: Normal rectum, normal colon; repeat in 2015 due to FH of colon cancer   COLONOSCOPY N/A 01/09/2014   [id number] colonic polyps-removed as described   COLONOSCOPY N/A 09/18/2016   Procedure: COLONOSCOPY;  Surgeon: Corbin Ade, MD;  [facility];  Service: Endoscopy;  Laterality: N/A;  730    ESOPHAGOGASTRODUODENOSCOPY  10/04/2007   RMR: Distal esophageal erosions consistent with erosive reflux esophagitis, patulous gastroesophageal junction status post passage of a  Maloney dilator, 56 [place].  Otherwise, unremarkable esophagus.  Hiatal hernia.  Otherwise normal stomach.  Bulbar erosion    ESOPHAGOGASTRODUODENOSCOPY N/A 01/09/2014   Erosive reflux esophagitis. Small hiatal hernia   I & D EXTREMITY Left 07/26/2018   Procedure: IRRIGATION AND DEBRIDEMENT EXTREMITY;  Surgeon: Knute Neu, MD;  Location[facility];  Service: Plastics;  Laterality: Left;   PERCUTANEOUS PINNING Left 07/26/2018   Procedure: PERCUTANEOUS PINNING EXTREMITY;  Surgeon: Knute Neu, MD;  Location: [facility];  Service: Plastics;  Laterality: Left;    ROS: Review of Systems Negative except as stated above  PHYSICAL EXAM: BP 136/89 (BP Location: Left Arm, Patient Position: Sitting)    Pulse 90    Ht 5' 9.5" (1.765 m)    Wt 240 lb 3.2 oz (109 kg)    SpO2 98%    BMI 34.96 kg/m   Physical Exam Constitutional:      Appearance: He is obese.  HENT:     Head: Normocephalic.  Eyes:     Extraocular Movements: Extraocular movements intact.     Pupils: Pupils are equal, round, and reactive to light.  Cardiovascular:     Rate and Rhythm: Normal rate and regular rhythm.     Pulses: Normal pulses.     Heart sounds: Normal heart sounds.  Pulmonary:     Effort: Pulmonary effort is normal.     Breath sounds: Normal breath sounds.  Musculoskeletal:     Cervical back: Normal range of motion and neck supple.     Lumbar back: Tenderness present.     Comments: Left lower back tenderness on examination.  Neurological:     General: No focal deficit present.     Mental Status: He is alert.  Psychiatric:        Mood and Affect: Mood normal.        Behavior: Behavior normal.    Results for orders placed or performed in visit on 06/15/20  POCT glucose (manual entry)  Result Value Ref Range   POC Glucose 97 70 - 99 mg/dl  POCT glycosylated hemoglobin (Hb A1C)  Result Value Ref Range   Hemoglobin A1C 6.3 (A) 4.0 - 5.6 %   HbA1c POC (<> result, manual entry)     HbA1c, POC (prediabetic range)     HbA1c, POC (controlled diabetic range)      ASSESSMENT AND PLAN: 1. Screening for diabetes mellitus: - CBG normal  today.  - Hemoglobin A1c today 6.3% this is elevated from previous hemoglobin A1c 5.9% on 06/10/2019. - Hemoglobin A1c consistent with pre-diabetes.  - Patient not ready to try anti-diabetic medications at this time. - Discussed the importance of healthy eating habits, low-carbohydrate diet, low-sugar diet, regular aerobic exercise (at least 150 minutes a week as tolerated)  to achieve or maintain control of pre-diabetes. - Follow-up with primary physician as scheduled. - POCT glucose (manual entry) - POCT glycosylated  hemoglobin (Hb A1C)  2. Acute bilateral low back pain, unspecified whether sciatica present: - Naproxen as prescribed for back pain. - Follow-up with primary physician in 4 to 6 weeks or sooner if needed. - naproxen (NAPROSYN) 500 MG tablet; Take 1 tablet (500 mg total) by mouth 2 (two) times daily with a meal.  Dispense: 60 tablet; Refill: 0   Patient was given the opportunity to ask questions.  Patient verbalized understanding of the plan and was able to repeat key elements of the plan. Patient was given clear instructions to go to Emergency Department or return to medical center if symptoms don't improve, worsen, or new problems develop.The patient verbalized understanding.   Orders Placed This Encounter  Procedures   POCT glucose (manual entry)   POCT glycosylated hemoglobin (Hb A1C)     Requested Prescriptions   Signed Prescriptions Disp Refills   naproxen (NAPROSYN) 500 MG tablet 60 tablet 0    Sig: Take 1 tablet (500 mg total) by mouth 2 (two) times daily with a meal.    Return in about 3 months (around 09/13/2020) for Dr. Juleen China and 4 - 6 weeks with Dr. Harriette Ohara for back pain.  Camillia Herter, NP

## 2020-06-14 NOTE — Telephone Encounter (Signed)
Att to contact pt to see if he can come earlier than scheduled appt time, no ans lvm

## 2020-06-15 ENCOUNTER — Encounter: Payer: Self-pay | Admitting: Family

## 2020-06-15 ENCOUNTER — Other Ambulatory Visit: Payer: Self-pay

## 2020-06-15 ENCOUNTER — Ambulatory Visit (INDEPENDENT_AMBULATORY_CARE_PROVIDER_SITE_OTHER): Payer: Self-pay | Admitting: Family

## 2020-06-15 ENCOUNTER — Encounter: Payer: Self-pay | Admitting: Internal Medicine

## 2020-06-15 VITALS — BP 136/89 | HR 90 | Ht 69.5 in | Wt 240.2 lb

## 2020-06-15 DIAGNOSIS — M545 Low back pain, unspecified: Secondary | ICD-10-CM

## 2020-06-15 DIAGNOSIS — Z7689 Persons encountering health services in other specified circumstances: Secondary | ICD-10-CM

## 2020-06-15 DIAGNOSIS — Z131 Encounter for screening for diabetes mellitus: Secondary | ICD-10-CM

## 2020-06-15 LAB — POCT GLYCOSYLATED HEMOGLOBIN (HGB A1C): Hemoglobin A1C: 6.3 % — AB (ref 4.0–5.6)

## 2020-06-15 LAB — GLUCOSE, POCT (MANUAL RESULT ENTRY): POC Glucose: 97 mg/dl (ref 70–99)

## 2020-06-15 MED ORDER — NAPROXEN 500 MG PO TABS: 500.0000 mg | ORAL_TABLET | Freq: Two times a day (BID) | ORAL | 0 refills | Status: DC

## 2020-06-15 NOTE — Patient Instructions (Signed)
Naproxen for back pain. Follow-up in 4 to 6 weeks with primary physician.   Discussed that patient does have pre-diabetes. Patient would like to try heart healthy diet and exercise to help with this   Schedule physical exam with primary physician in the next 3 months or sooner if needed.   Acute Back Pain, Adult Acute back pain is sudden and usually short-lived. It is often caused by an injury to the muscles and tissues in the back. The injury may result from:  A muscle or ligament getting overstretched or torn (strained). Ligaments are tissues that connect bones to each other. Lifting something improperly can cause a back strain.  Wear and tear (degeneration) of the spinal disks. Spinal disks are circular tissue that provide cushioning between the bones of the spine (vertebrae).  Twisting motions, such as while playing sports or doing yard work.  A hit to the back.  Arthritis. You may have a physical exam, lab tests, and imaging tests to find the cause of your pain. Acute back pain usually goes away with rest and home care. Follow these instructions at home: Managing pain, stiffness, and swelling  Treatment may include medicines for pain and inflammation that are taken by mouth or applied to the skin, prescription pain medicine, or muscle relaxants. Take over-the-counter and prescription medicines only as told by your health care provider.  Your health care provider may recommend applying ice during the first 24-48 hours after your pain starts. To do this: ? Put ice in a plastic bag. ? Place a towel between your skin and the bag. ? Leave the ice on for 20 minutes, 2-3 times a day.  If directed, apply heat to the affected area as often as told by your health care provider. Use the heat source that your health care provider recommends, such as a moist heat pack or a heating pad. ? Place a towel between your skin and the heat source. ? Leave the heat on for 20-30 minutes. ? Remove the  heat if your skin turns bright red. This is especially important if you are unable to feel pain, heat, or cold. You have a greater risk of getting burned. Activity  Do not stay in bed. Staying in bed for more than 1-2 days can delay your recovery.  Sit up and stand up straight. Avoid leaning forward when you sit or hunching over when you stand. ? If you work at a desk, sit close to it so you do not need to lean over. Keep your chin tucked in. Keep your neck drawn back, and keep your elbows bent at a 90-degree angle (right angle). ? Sit high and close to the steering wheel when you drive. Add lower back (lumbar) support to your car seat, if needed.  Take short walks on even surfaces as soon as you are able. Try to increase the length of time you walk each day.  Do not sit, drive, or stand in one place for more than 30 minutes at a time. Sitting or standing for long periods of time can put stress on your back.  Do not drive or use heavy machinery while taking prescription pain medicine.  Use proper lifting techniques. When you bend and lift, use positions that put less stress on your back: ? [place] your knees. ? Keep the load close to your body. ? Avoid twisting.  Exercise regularly as told by your health care provider. Exercising helps your back heal faster and helps prevent back injuries  by keeping muscles strong and flexible.  Work with a physical therapist to make a safe exercise program, as recommended by your health care provider. Do any exercises as told by your physical therapist.   Lifestyle  Maintain a healthy weight. Extra weight puts stress on your back and makes it difficult to have good posture.  Avoid activities or situations that make you feel anxious or stressed. Stress and anxiety increase muscle tension and can make back pain worse. Learn ways to manage anxiety and stress, such as through exercise. General instructions  Sleep on a firm mattress in a comfortable position.  Try lying on your side with your knees slightly bent. If you lie on your back, put a pillow under your knees.  Follow your treatment plan as told by your health care provider. This may include: ? Cognitive or behavioral therapy. ? Acupuncture or massage therapy. ? Meditation or yoga. Contact a health care provider if:  You have pain that is not relieved with rest or medicine.  You have increasing pain going down into your legs or buttocks.  Your pain does not improve after 2 weeks.  You have pain at night.  You lose weight without trying.  You have a fever or chills. Get help right away if:  You develop new bowel or bladder control problems.  You have unusual weakness or numbness in your arms or legs.  You develop nausea or vomiting.  You develop abdominal pain.  You feel faint. Summary  Acute back pain is sudden and usually short-lived.  Use proper lifting techniques. When you bend and lift, use positions that put less stress on your back.  Take over-the-counter and prescription medicines and apply heat or ice as directed by your health care provider. This information is not intended to replace advice given to you by your health care provider. Make sure you discuss any questions you have with your health care provider. Document Revised: 02/03/2020 Document Reviewed: 02/03/2020 Elsevier Patient Education  2021 [employer].

## 2020-06-15 NOTE — Progress Notes (Signed)
Pt wanted glucose levels checked has not been here in a while concerned about overall health  Lower back pain

## 2020-09-10 ENCOUNTER — Other Ambulatory Visit: Payer: Self-pay | Admitting: Internal Medicine

## 2020-09-11 NOTE — Telephone Encounter (Signed)
Pt currently wants his medication refilled hydrochlorothiazide (HYDRODIURIL) 25 MG tablet and his lisinopril (ZESTRIL) 40 MG tablet [[id number]. He verbalizes he does not have any more pills left and would like to get them refilled today if possible. I informed him that his provider is out of the office. He states these pills are important for him to take today because these are his blood pressure pills. Please follow up with pt and let him know his next steps.

## 2020-09-13 ENCOUNTER — Ambulatory Visit: Payer: No Typology Code available for payment source | Admitting: Internal Medicine

## 2020-09-25 ENCOUNTER — Ambulatory Visit: Payer: No Typology Code available for payment source | Admitting: Internal Medicine

## 2020-09-25 ENCOUNTER — Other Ambulatory Visit: Payer: Self-pay | Admitting: *Deleted

## 2020-09-25 MED ORDER — HYDROCHLOROTHIAZIDE 25 MG PO TABS: 1.0000 | ORAL_TABLET | Freq: Every day | ORAL | 0 refills | Status: DC

## 2020-10-02 ENCOUNTER — Encounter: Payer: Self-pay | Admitting: Internal Medicine

## 2020-10-02 ENCOUNTER — Other Ambulatory Visit: Payer: Self-pay

## 2020-10-02 ENCOUNTER — Ambulatory Visit (INDEPENDENT_AMBULATORY_CARE_PROVIDER_SITE_OTHER): Payer: BC Managed Care – PPO

## 2020-10-02 ENCOUNTER — Ambulatory Visit (INDEPENDENT_AMBULATORY_CARE_PROVIDER_SITE_OTHER): Payer: BC Managed Care – PPO | Admitting: Internal Medicine

## 2020-10-02 VITALS — BP 129/88 | HR 103 | Temp 97.5°F | Resp 16 | Wt 241.0 lb

## 2020-10-02 DIAGNOSIS — E782 Mixed hyperlipidemia: Secondary | ICD-10-CM

## 2020-10-02 DIAGNOSIS — G8929 Other chronic pain: Secondary | ICD-10-CM

## 2020-10-02 DIAGNOSIS — M545 Low back pain, unspecified: Secondary | ICD-10-CM

## 2020-10-02 MED ORDER — CYCLOBENZAPRINE HCL 10 MG PO TABS: 10.0000 mg | ORAL_TABLET | Freq: Three times a day (TID) | ORAL | 0 refills | Status: DC | PRN

## 2020-10-02 NOTE — Progress Notes (Signed)
  Subjective:    [name] - 50 y.o. male MRN [id number]  Date of birth: March 27, 1971  HPI  [name] is here for back pain. Reports occurs daily, has been happening for several months. Located in lower back on both sides of spine. Rates as 7/10. No known injury or inciting factor. Denies fevers, numbness/tingling/weakness in legs, changes in bowel or bladder.      Health Maintenance:  Health Maintenance Due  Topic Date Due  . Hepatitis C Screening  Never done  . COVID-19 Vaccine (3 - Booster for Pfizer series) 03/28/2020    -  reports that he has been smoking cigarettes. He has a 2.50 pack-year smoking history. He has never used smokeless tobacco. - Review of Systems: Per HPI. - Past Medical History: Patient Active Problem List   Diagnosis Date Noted  . Hyperlipidemia 02/17/2019  . Lesion of liver 09/23/2017  . Family history of colon cancer 05/01/2016  . Atypical chest pain 05/01/2016  . Abnormal ultrasound 07/19/2015  . Erosive esophagitis 04/11/2014  . Hx of adenomatous colonic polyps 04/11/2014  . Abdominal pain 06/01/2012  . Rectal bleeding 06/01/2012  . Constipation 06/01/2012  . ALCOHOL ABUSE 09/06/2008  . TOBACCO ABUSE 09/06/2008  . Essential hypertension 09/06/2008  . GASTROESOPHAGEAL REFLUX DISEASE, CHRONIC 09/06/2008  . CONSTIPATION, CHRONIC 09/06/2008  . DYSPHAGIA UNSPECIFIED 09/06/2008   - Medications: reviewed and updated   Objective:   Physical Exam BP 129/88   Pulse (!) 103   Temp (!) 97.5 F (36.4 C)   Resp 16   Wt 241 lb (109.3 kg)   SpO2 96%   BMI 35.08 kg/m  Physical Exam Constitutional:      General: He is not in acute distress.    Appearance: He is not diaphoretic.  Cardiovascular:     Rate and Rhythm: Normal rate.  Pulmonary:     Effort: Pulmonary effort is normal. No respiratory distress.  Musculoskeletal:        General: Normal range of motion.     Comments: TTP over lumbar paraspinal muscles. No TTP over the spine.  Full ROM present.   Skin:    General: Skin is warm and dry.  Neurological:     Mental Status: He is alert and oriented to person, place, and time.  Psychiatric:        Mood and Affect: Affect normal.        Judgment: Judgment normal.            Assessment & Plan:  1. Chronic bilateral low back pain without sciatica May be related to paraspinal muscle strain. Will prescribe muscle relaxer and PT. Given chronicity of pain, will obtain lumbar imaging. No red flag symptoms.  - DG Lumbar Spine Complete - cyclobenzaprine (FLEXERIL) 10 MG tablet; Take 1 tablet (10 mg total) by mouth 3 (three) times daily as needed for muscle spasms.  Dispense: 30 tablet; Refill: 0 - Ambulatory referral to Physical Therapy  2. Mixed hyperlipidemia - Comprehensive metabolic panel - Lipid panel    Phill Myron, D.O. 10/02/2020, 1:48 PM Primary Care at [facility]

## 2020-10-02 NOTE — Progress Notes (Signed)
Has concerns for back ache daily 7/10 generalized pain  Would like labs for heart and liver

## 2020-10-05 LAB — LIPID PANEL
Chol/HDL Ratio: 5.1 ratio — ABNORMAL HIGH (ref 0.0–5.0)
Cholesterol, Total: 208 mg/dL — ABNORMAL HIGH (ref 100–199)
HDL: 41 mg/dL (ref >39)
LDL Chol Calc (NIH): 120 mg/dL — ABNORMAL HIGH (ref 0–99)
Triglycerides: 265 mg/dL — ABNORMAL HIGH (ref 0–149)
VLDL Cholesterol Cal: 47 mg/dL — ABNORMAL HIGH (ref 5–40)

## 2020-10-05 LAB — COMPREHENSIVE METABOLIC PANEL
ALT: 22 IU/L (ref 0–44)
AST: 21 IU/L (ref 0–40)
Albumin/Globulin Ratio: 1.5 (ref 1.2–2.2)
Albumin: 4.7 g/dL (ref 3.8–4.9)
Alkaline Phosphatase: 75 IU/L (ref 44–121)
BUN/Creatinine Ratio: 11 (ref 9–20)
BUN: 12 mg/dL (ref 6–24)
Bilirubin Total: 0.3 mg/dL (ref 0.0–1.2)
CO2: 23 mmol/L (ref 20–29)
Calcium: 9.6 mg/dL (ref 8.7–10.2)
Chloride: 99 mmol/L (ref 96–106)
Creatinine, Ser: 1.05 mg/dL (ref 0.76–1.27)
Globulin, Total: 3.1 g/dL (ref 1.5–4.5)
Glucose: 96 mg/dL (ref 65–99)
Potassium: 4.4 mmol/L (ref 3.5–5.2)
Sodium: 138 mmol/L (ref 134–144)
Total Protein: 7.8 g/dL (ref 6.0–8.5)
eGFR: 86 mL/min/{1.73_m2} (ref >59)

## 2020-10-05 LAB — SPECIMEN STATUS REPORT

## 2020-10-09 ENCOUNTER — Other Ambulatory Visit: Payer: Self-pay | Admitting: Internal Medicine

## 2020-10-09 DIAGNOSIS — M47816 Spondylosis without myelopathy or radiculopathy, lumbar region: Secondary | ICD-10-CM

## 2020-10-09 DIAGNOSIS — E785 Hyperlipidemia, unspecified: Secondary | ICD-10-CM

## 2020-10-09 MED ORDER — ROSUVASTATIN CALCIUM 10 MG PO TABS: 10.0000 mg | ORAL_TABLET | Freq: Every day | ORAL | 3 refills | Status: DC

## 2020-10-11 ENCOUNTER — Telehealth (INDEPENDENT_AMBULATORY_CARE_PROVIDER_SITE_OTHER): Payer: Self-pay

## 2020-10-11 ENCOUNTER — Other Ambulatory Visit: Payer: Self-pay | Admitting: Internal Medicine

## 2020-10-11 NOTE — Telephone Encounter (Signed)
Spoke with patient. He verified date of birth. He is aware of Xray results. He is scheduled to ortho on 10/24/20. Nat Christen, CMA

## 2020-10-11 NOTE — Telephone Encounter (Signed)
Spoke with patient. He verified date of birth. He is aware of these results. Informed that cholesterol lowering medication has been sent to pharmacy. He verbalized understanding. Nat Christen, CMA

## 2020-10-11 NOTE — Telephone Encounter (Signed)
-----   Message from Nicolette Bang, DO sent at 10/09/2020  5:46 PM EDT ----- Please call patient about lumbar xray results. Shows prominent facet hypertrophic changes at multiple levels in low back---the worst at the two lowest vertebrae of lumbar, meaning has enlargement and breakdown of the small joints that run along the spine. I have placed a referral to orthopedics for further management as this is the likely source of his pain.   Phill Myron, D.O. Primary Care at [facility]  10/09/2020, 5:45 PM

## 2020-10-11 NOTE — Telephone Encounter (Signed)
-----   Message from Nicolette Bang, DO sent at 10/09/2020 12:17 PM EDT ----- Please call [name] about results. CMET normal. Total cholesterol, triglycerides, and LDL are elevated. Given this and 10 year risk of heart attack/stroke being 16% would recommend starting cholesterol lowering medication. Have sent Crestor to pharmacy. Would recommend aiming for 150 minutes of cardiac exercise per week. Advise to decrease animal fat intake, saturated fats, processed foods. Increase fiber rich foods such as fruits and veggies.      The 10-year ASCVD risk score Mikey Bussing DC Brooke Bonito., et al., 2013) is: 16.7%   Values used to calculate the score:     Age: 14 years     Sex: Male     Is Non-Hispanic African American: Yes     Diabetic: No     Tobacco smoker: Yes     Systolic Blood Pressure: 846 mmHg     Is BP treated: Yes     HDL Cholesterol: 41 mg/dL     Total Cholesterol: 208 mg/dL   Thanks,  Phill Myron, D.O. Primary Care at [facility]  10/09/2020, 12:15 PM

## 2020-10-24 ENCOUNTER — Ambulatory Visit: Payer: BC Managed Care – PPO | Admitting: Family Medicine

## 2020-10-25 ENCOUNTER — Telehealth: Payer: Self-pay

## 2020-10-25 ENCOUNTER — Ambulatory Visit: Payer: BC Managed Care – PPO | Attending: Internal Medicine

## 2020-10-31 ENCOUNTER — Ambulatory Visit: Payer: BC Managed Care – PPO | Admitting: Family Medicine

## 2020-11-12 NOTE — Telephone Encounter (Signed)
Made in error

## 2020-11-22 ENCOUNTER — Encounter: Payer: Self-pay | Admitting: Family

## 2020-11-22 ENCOUNTER — Other Ambulatory Visit: Payer: Self-pay

## 2020-11-22 ENCOUNTER — Telehealth: Payer: Self-pay | Admitting: Internal Medicine

## 2020-11-22 DIAGNOSIS — K219 Gastro-esophageal reflux disease without esophagitis: Secondary | ICD-10-CM

## 2020-11-22 DIAGNOSIS — I10 Essential (primary) hypertension: Secondary | ICD-10-CM

## 2020-11-22 MED ORDER — OMEPRAZOLE 40 MG PO CPDR: 40.0000 mg | DELAYED_RELEASE_CAPSULE | Freq: Every day | ORAL | 1 refills | Status: DC

## 2020-11-22 MED ORDER — LISINOPRIL 40 MG PO TABS: 40.0000 mg | ORAL_TABLET | Freq: Every day | ORAL | 0 refills | Status: DC

## 2020-11-22 MED ORDER — HYDROCHLOROTHIAZIDE 25 MG PO TABS: 25.0000 mg | ORAL_TABLET | Freq: Every day | ORAL | 0 refills | Status: DC

## 2020-11-22 NOTE — Progress Notes (Signed)
Rx refilled.

## 2020-11-22 NOTE — Telephone Encounter (Signed)
Refills completed

## 2020-11-22 NOTE — Telephone Encounter (Signed)
Patient called in needing refills on his medications  hydrochlorothiazide (HYDRODIURIL) 25 MG tablet  lisinopril (ZESTRIL) 40 MG tablet  omeprazole (PRILOSEC) 40 MG capsule   Pharmacy  [place] ([place]), [place] - [address]  [address] [facility]) [place] [address]  Phone:  [phone]  Fax:  [phone]

## 2021-02-10 ENCOUNTER — Other Ambulatory Visit: Payer: Self-pay | Admitting: Family

## 2021-02-10 DIAGNOSIS — I10 Essential (primary) hypertension: Secondary | ICD-10-CM

## 2021-02-11 ENCOUNTER — Telehealth: Payer: Self-pay | Admitting: Cardiovascular Disease

## 2021-02-11 NOTE — Telephone Encounter (Signed)
Attempted to contact patient, LVM to call back to discuss.  Left call back number.   

## 2021-02-11 NOTE — Telephone Encounter (Signed)
This was sent Via MyChart message to our Sching pool:    ----- Message -----      From:Shana S      Sent:02/11/2021 10:57 AM EDT        [id number] [place] can you answer a couple of question for me about your chest pain because I will need to get this over to our triage nurse to evaluate 1st.   1. Are you having CP right now?   Discomfort  2. Are you experiencing any other symptoms (ex. SOB, nausea, vomiting, sweating)? Nausea Dizziness 3. How long have you been experiencing CP? 3 week  4. Is your CP continuous or coming and going?  Coming and going  5. Have you taken Nitroglycerin?   ?       ----- Message -----      From:Atilano Donita Brooks      Sent:02/11/2021 10:34 AM EDT        [id number] Appointment Schedule Request Message List   Subject:Appointment Request  I would like to seen as soon as possible, felling discomfort in both stomach and heart at times

## 2021-02-18 NOTE — Telephone Encounter (Signed)
Spoke with the pt and he reports that he has been having chest discomfort after he eats with some nausea associated with it... he denies dyspnea and dizziness.   He had a zero ca score 05/2019. He denies chest pain at rest or with exertion just seems to be associated with his GI upset.   I have asked that he calls his PCP for an appt this week hopefully..I made him an appt with Sande Rives PA for 10/522 but he will call back if his symptoms change or worsen... he will also consider the ED if he does not feel well and not drive himself,.

## 2021-02-18 NOTE — Telephone Encounter (Signed)
  Patient is requesting a call back via MyChart Scheduling request pool regarding previous note

## 2021-02-23 ENCOUNTER — Other Ambulatory Visit: Payer: Self-pay | Admitting: Family

## 2021-02-23 DIAGNOSIS — I10 Essential (primary) hypertension: Secondary | ICD-10-CM

## 2021-02-23 NOTE — Progress Notes (Signed)
Cardiology Office Note:    Date:  02/27/2021   ID:  [name], DOB [birth year], MRN [id number]  PCP:  Christopher Mai, MD  Cardiologist:  Quay Burow, MD  Electrophysiologist:  None   Referring MD: Christopher Mai, MD   Chief Complaint: chest pain  History of Present Illness:    [name] is a 50 y.o. male with a history of atypical chest pain with low risk Myoview in 2018 and coronary calcium score of 0 in 05/2019, hypertension, hyperlipidemia, GERD with known esophagogastritis, hiatal hernia, and tobacco abuse who is followed by Dr. Gwenlyn [name] and presents today for further evaluation of chest pain.  Patient was initially seen by Dr. Domenic [name] in 07/2016 for further evaluation of atypical chest pain that he described as left-sided sporadic mild dullness. Myoview was ordered with further evaluation and was low risk with a medium defect in the inferior region felt to mostly likely be attenuation artifact (although scar could not be ruled out) but no ischemia. He was not seen again until 05/2019 when he was referred back to Dr. Gwenlyn [name] for follow up of ED visit for chest pain where he ruled out for MI. Coronary calcium score was ordered and was 0. Therefore, chest pain not felt to be cardiac in nature.  Patient called our office on 02/11/2021 with reports of chest pain after he eats. Therefore, this visit was arranged for further evaluation.  Patient describes pressure that radiates from his stomach all the way to his chest and neck.  He also states that it will feel like he has something stuck in his throat.  Mainly occurs after eating but sometimes occurs before meals as well.  He states that this does feel similar to the pain he has had in the past.  Occurs on at least an every other day basis.  He has not no exertional chest pain.  He he states he can walk on a treadmill for an hour without any problems.  He does have a history of esophagitis and gastritis and is on Omeprazole.  He thinks he  was previously on Protonix which seem to work better.  He [place] but has since [place] to [place] and has not gotten established with someone around here.  He denies any shortness of breath.  No orthopnea.  He states he will occasionally wake up suddenly from his snoring  He would like to have a sleep study and I agree with this.  No lower extremity edema.  No palpitations.  He describes occasional lightheadedness but no syncope.  He has bad allergies and that he may be related to this.  He continues to smoke but is trying to quit. He has cut down to about 3 to 4 cigarettes a day. He is also trying to lose weight and has recently started exercising again.  Past Medical History:  Diagnosis Date   Atypical chest pain    GERD (gastroesophageal reflux disease)    Hepatic cyst    Benign by MRI   Hiatal hernia    History of colonic polyps    Hyperlipidemia    Hypertension    Rectal bleeding    Tobacco abuse     Past Surgical History:  Procedure Laterality Date   COLONOSCOPY  01/10/2009     RMR: Normal rectum, normal colon; repeat in 2015 due to [place] of colon cancer   COLONOSCOPY N/A 01/09/2014   TFT:[id number] colonic polyps-removed as described  COLONOSCOPY N/A 09/18/2016   Procedure: COLONOSCOPY;  Surgeon: Christopher Dolin, MD;  [facility];  Service: Endoscopy;  Laterality: N/A;  730    ESOPHAGOGASTRODUODENOSCOPY  10/04/2007   RMR: Distal esophageal erosions consistent with erosive reflux esophagitis, patulous gastroesophageal junction status post passage of a  Maloney dilator, 50 [place].  Otherwise, unremarkable esophagus.  Hiatal hernia.  Otherwise normal stomach.  Bulbar erosion   ESOPHAGOGASTRODUODENOSCOPY N/A 01/09/2014   Erosive reflux esophagitis. Small hiatal hernia   I & D EXTREMITY Left 07/26/2018   Procedure: IRRIGATION AND DEBRIDEMENT EXTREMITY;  Surgeon: Christopher Barker, MD;  Location: [place];  Service: Plastics;  Laterality:  Left;   PERCUTANEOUS PINNING Left 07/26/2018   Procedure: PERCUTANEOUS PINNING EXTREMITY;  Surgeon: Christopher Barker, MD;  Location: [facility];  Service: Plastics;  Laterality: Left;    Current Medications: Current Meds  Medication Sig   cyclobenzaprine (FLEXERIL) 10 MG tablet Take 1 tablet (10 mg total) by mouth 3 (three) times daily as needed for muscle spasms.   hydrochlorothiazide (HYDRODIURIL) 25 MG tablet Take 1 tablet (25 mg total) by mouth daily.   nicotine polacrilex (NICORETTE) 4 MG gum Take 1 each (4 mg total) by mouth as needed for smoking cessation (every 2 hours).   pantoprazole (PROTONIX) 40 MG tablet Take 1 tablet (40 mg total) by mouth daily.   rosuvastatin (CRESTOR) 10 MG tablet Take 1 tablet (10 mg total) by mouth daily.   [DISCONTINUED] lisinopril (ZESTRIL) 40 MG tablet Take 1 tablet by mouth once daily   [DISCONTINUED] omeprazole (PRILOSEC) 40 MG capsule Take 1 capsule (40 mg total) by mouth daily.     Allergies:   Patient has no known allergies.   Social History   Socioeconomic History   Marital status: Single    Spouse name: Not on file   Number of children: Not on file   Years of education: Not on file   Highest education level: Not on file  Occupational History   Occupation: [employer]    Employer: [employer] in [place] Use   Smoking status: Every Day    Packs/day: 0.25    Years: 10.00    Pack years: 2.50    Types: Cigarettes   Smokeless tobacco: Never  Vaping Use   Vaping Use: Never used  Substance and Sexual Activity   Alcohol use: Yes    Alcohol/week: 0.0 standard drinks    Comment: occasional/wine on the weekends   Drug use: No   Sexual activity: Not on file  Other Topics Concern   Not on file  Social History Narrative   Right handed   Lives alone one story home   Social Determinants of Health   Financial Resource Strain: Not on file  Food Insecurity: Not on file  Transportation Needs: Not on  file  Physical Activity: Not on file  Stress: Not on file  Social Connections: Not on file     Family History: The patient's family history includes Colon cancer in his sister; Diabetes in his father and another family member; Heart attack in his maternal uncle; Heart disease in his mother; Hypertension in his father and mother.  ROS:   Please see the history of present illness.     EKGs/Labs/Other Studies Reviewed:    The following studies were reviewed today:  Myoview 08/08/2016: <1 mm ST depressions with T wave inversions at rest and throughout study in leads III and aVF. Defect 1: There is  a medium defect of moderate severity present in the basal inferoseptal, basal inferior, basal inferolateral, mid inferoseptal, mid inferior and mid inferolateral location. While this is likely due to soft tissue attenuation artifact, a degree of myocardial scar cannot enitrely be ruled out. This is a low risk study. No ischemic territories. Nuclear stress EF: 61%. _______________  CT Cardiac Scoring 06/02/2019: Coronary calcium score of 0. This was 0 percentile for age and sex matched control.   EKG:  EKG ordered today. EKG personally reviewed and demonstrates normal sinus rhythm, rate 87 bpm, with no acute ST/T changes. Normal axis. Normal PR and QRS intervals. QTc 409 ms.  Recent Labs: 10/02/2020: ALT 22; BUN 12; Creatinine, Ser 1.05; Potassium 4.4; Sodium 138  Recent Lipid Panel    Component Value Date/Time   CHOL 208 (H) 10/02/2020 1359   TRIG 265 (H) 10/02/2020 1359   HDL 41 10/02/2020 1359   CHOLHDL 5.1 (H) 10/02/2020 1359   CHOLHDL 8.5 06/23/2008 0810   VLDL 31 06/23/2008 0810   LDLCALC 120 (H) 10/02/2020 1359    Physical Exam:    Vital Signs: BP (!) 160/99 (BP Location: Left Arm, Patient Position: Sitting)   Pulse 87   Ht 5' 10.8" (1.798 m)   Wt 253 lb (114.8 kg)   SpO2 98%   BMI 35.49 kg/m     Wt Readings from Last 3 Encounters:  02/27/21 253 lb (114.8 kg)  10/02/20  241 lb (109.3 kg)  06/15/20 240 lb 3.2 oz (109 kg)     General: 50 y.o. obese African-American male in no acute distress. HEENT: Normocephalic and atraumatic. Sclera clear.  Neck: Supple. No carotid bruits. No JVD. Heart: RRR. Distinct S1 and S2. No murmurs, gallops, or rubs. Radial pulses 2+ and equal bilaterally. Lungs: No increased work of breathing. Clear to ausculation bilaterally. No wheezes, rhonchi, or rales.  Abdomen: Soft, non-distended, and non-tender to palpation.  MSK: Normal strength and tone for age.  Extremities: No lower extremity edema.    Skin: Warm and dry. Neuro: Alert and oriented x3. No focal deficits. Psych: Normal affect. Responds appropriately.  Assessment:    1. Chest pain of uncertain etiology   2. Gastroesophageal reflux disease with esophagitis without hemorrhage   3. Primary hypertension   4. Hyperlipidemia, unspecified hyperlipidemia type   5. Tobacco abuse   6. Essential hypertension   7. Snoring   8. Class 2 obesity with body mass index (BMI) of 35.0 to 35.9 in adult, unspecified obesity type, unspecified whether serious comorbidity present     Plan:    Chest Pain GERD with History of Esophagitis and Gastritis - History of atypical chest pain. Myoview in 2018 was low risk. Coronary calcium score in 05/2019 was 0. He has history of GERD with known esophagitis and gastritis. Now presents with pain that radiates from upper abdomen to chest and neck. Mostly after eating. No exertional chest pain. - EKG shows no acute ischemic changes. - Symptoms very atypical and consistent with GI etiology. Will stop Omeprazole and start Protonix 40mg  daily (he states this has worked better in the past). Previously followed by GI in [place] but has since moved to [place] and would like to get established with someone here. Will place GI referral.  Hypertension - BP elevated at 160/99 in the office. However, patient has been out of Lisinopril for a few days. Per  review of chart, BP normally reasonably well controlled at office visits. - Will refill Lisinopril 40mg  daily.  - Continue  HCTZ 25mg  daily. - Advised patient to keep a BP/HR log for 2 weeks after restarting Lisinopril and then send this to [place].  Hyperlipidemia - Lipid panel in 09/2020: Total Cholesterol 208, Triglycerides 265, HDL 41, LDL 120.  - PCP started Crestor 10mg  daily at time of last labs. - Will defer management to PCP.  Tobacco Abuse - Patient is trying to quit and has cut back to 3-4 cigarettes per day. Congratulated patient on this and encouraged him to continue to work towards complete cessation. - Patient would like to try nicotine gum. Will order today.  Snoring - Patient describes snoring that wakes him up from sleep.  - BMI 35.  - Suspect he has sleep apnea. Will order sleep study.  Obesity - BMI 35. - Patient is trying to lose weight. He has recently started exercising him. Encouraged him to continue this and follow heart healthy diet.  Disposition: Follow up in 6 months.   Medication Adjustments/Labs and Tests Ordered: Current medicines are reviewed at length with the patient today.  Concerns regarding medicines are outlined above.  Orders Placed This Encounter  Procedures   Ambulatory referral to Gastroenterology   EKG 12-Lead   Home sleep test    Meds ordered this encounter  Medications   nicotine polacrilex (NICORETTE) 4 MG gum    Sig: Take 1 each (4 mg total) by mouth as needed for smoking cessation (every 2 hours).    Dispense:  100 tablet    Refill:  0   pantoprazole (PROTONIX) 40 MG tablet    Sig: Take 1 tablet (40 mg total) by mouth daily.    Dispense:  30 tablet    Refill:  11   lisinopril (ZESTRIL) 40 MG tablet    Sig: Take 1 tablet (40 mg total) by mouth daily.    Dispense:  90 tablet    Refill:  1     Patient Instructions  Medication Instructions:  STOP omeprazole  START Protonix 40 mg daily  START Nicorette gum 4 mg as needed  every 2 hours Restart Lisinopril  *If you need a refill on your cardiac medications before your next appointment, please call your pharmacy*   Testing/Procedures: Your physician has recommended that you have a sleep study. This test records several body functions during sleep, including: brain activity, eye movement, oxygen and carbon dioxide blood levels, heart rate and rhythm, breathing rate and rhythm, the flow of air through your mouth and nose, snoring, body muscle movements, and chest and belly movement.   Follow-Up: At [facility], you and your health needs are our priority.  As part of our continuing mission to provide you with exceptional heart care, we have created designated Provider Care Teams.  These Care Teams include your primary Cardiologist (physician) and Advanced Practice Providers (APPs -  Physician Assistants and Nurse Practitioners) who all work together to provide you with the care you need, when you need it.  We recommend signing up for the patient portal called "MyChart".  Sign up information is provided on this After Visit Summary.  MyChart is used to connect with patients for Virtual Visits (Telemedicine).  Patients are able to view lab/test results, encounter notes, upcoming appointments, etc.  Non-urgent messages can be sent to your provider as well.   To learn more about what you can do with MyChart, go to [email or web address].    Your next appointment:   4 month(s)  The format for your next appointment:   In Person  Provider:  Sande Rives, PA-C   Other Instructions Referral to GI- they will contact you for an appointment.   Signed, Darreld Mclean, PA-C  02/27/2021 12:56 PM    [facility]

## 2021-02-26 ENCOUNTER — Encounter: Payer: Self-pay | Admitting: Student

## 2021-02-26 ENCOUNTER — Other Ambulatory Visit: Payer: Self-pay | Admitting: Family

## 2021-02-26 DIAGNOSIS — I10 Essential (primary) hypertension: Secondary | ICD-10-CM

## 2021-02-27 ENCOUNTER — Encounter: Payer: Self-pay | Admitting: Student

## 2021-02-27 ENCOUNTER — Encounter: Payer: Self-pay | Admitting: Gastroenterology

## 2021-02-27 ENCOUNTER — Ambulatory Visit (INDEPENDENT_AMBULATORY_CARE_PROVIDER_SITE_OTHER): Payer: BC Managed Care – PPO | Admitting: Student

## 2021-02-27 ENCOUNTER — Other Ambulatory Visit: Payer: Self-pay

## 2021-02-27 VITALS — BP 160/99 | HR 87 | Ht 70.8 in | Wt 253.0 lb

## 2021-02-27 DIAGNOSIS — R079 Chest pain, unspecified: Secondary | ICD-10-CM

## 2021-02-27 DIAGNOSIS — E785 Hyperlipidemia, unspecified: Secondary | ICD-10-CM

## 2021-02-27 DIAGNOSIS — K2100 Gastro-esophageal reflux disease with esophagitis, without bleeding: Secondary | ICD-10-CM

## 2021-02-27 DIAGNOSIS — Z720 Tobacco use: Secondary | ICD-10-CM

## 2021-02-27 DIAGNOSIS — I10 Essential (primary) hypertension: Secondary | ICD-10-CM

## 2021-02-27 DIAGNOSIS — R0683 Snoring: Secondary | ICD-10-CM

## 2021-02-27 DIAGNOSIS — Z6835 Body mass index (BMI) 35.0-35.9, adult: Secondary | ICD-10-CM

## 2021-02-27 DIAGNOSIS — E669 Obesity, unspecified: Secondary | ICD-10-CM

## 2021-02-27 MED ORDER — PANTOPRAZOLE SODIUM 40 MG PO TBEC: 40.0000 mg | DELAYED_RELEASE_TABLET | Freq: Every day | ORAL | 11 refills | Status: DC

## 2021-02-27 MED ORDER — LISINOPRIL 40 MG PO TABS: 40.0000 mg | ORAL_TABLET | Freq: Every day | ORAL | 1 refills | Status: DC

## 2021-02-27 MED ORDER — NICOTINE POLACRILEX 4 MG MT GUM: 4.0000 mg | CHEWING_GUM | OROMUCOSAL | 0 refills | Status: DC | PRN

## 2021-02-27 NOTE — Patient Instructions (Signed)
Medication Instructions:  STOP omeprazole  START Protonix 40 mg daily  START Nicorette gum 4 mg as needed every 2 hours Restart Lisinopril  *If you need a refill on your cardiac medications before your next appointment, please call your pharmacy*   Testing/Procedures: Your physician has recommended that you have a sleep study. This test records several body functions during sleep, including: brain activity, eye movement, oxygen and carbon dioxide blood levels, heart rate and rhythm, breathing rate and rhythm, the flow of air through your mouth and nose, snoring, body muscle movements, and chest and belly movement.   Follow-Up: At [facility], you and your health needs are our priority.  As part of our continuing mission to provide you with exceptional heart care, we have created designated Provider Care Teams.  These Care Teams include your primary Cardiologist (physician) and Advanced Practice Providers (APPs -  Physician Assistants and Nurse Practitioners) who all work together to provide you with the care you need, when you need it.  We recommend signing up for the patient portal called "MyChart".  Sign up information is provided on this After Visit Summary.  MyChart is used to connect with patients for Virtual Visits (Telemedicine).  Patients are able to view lab/test results, encounter notes, upcoming appointments, etc.  Non-urgent messages can be sent to your provider as well.   To learn more about what you can do with MyChart, go to [email or web address].    Your next appointment:   4 month(s)  The format for your next appointment:   In Person  Provider:   Sande Rives, PA-C   Other Instructions Referral to GI- they will contact you for an appointment.

## 2021-03-11 ENCOUNTER — Ambulatory Visit (INDEPENDENT_AMBULATORY_CARE_PROVIDER_SITE_OTHER): Payer: BC Managed Care – PPO | Admitting: Gastroenterology

## 2021-03-11 ENCOUNTER — Other Ambulatory Visit: Payer: Self-pay

## 2021-03-11 ENCOUNTER — Encounter: Payer: Self-pay | Admitting: Gastroenterology

## 2021-03-11 VITALS — BP 138/90 | HR 108 | Ht 70.8 in | Wt 248.4 lb

## 2021-03-11 DIAGNOSIS — K219 Gastro-esophageal reflux disease without esophagitis: Secondary | ICD-10-CM

## 2021-03-11 DIAGNOSIS — Z1211 Encounter for screening for malignant neoplasm of colon: Secondary | ICD-10-CM

## 2021-03-11 DIAGNOSIS — Z1212 Encounter for screening for malignant neoplasm of rectum: Secondary | ICD-10-CM

## 2021-03-11 DIAGNOSIS — R140 Abdominal distension (gaseous): Secondary | ICD-10-CM

## 2021-03-11 DIAGNOSIS — Z86010 Personal history of colonic polyps: Secondary | ICD-10-CM | POA: Diagnosis not present

## 2021-03-11 DIAGNOSIS — K7689 Other specified diseases of liver: Secondary | ICD-10-CM

## 2021-03-11 DIAGNOSIS — R1310 Dysphagia, unspecified: Secondary | ICD-10-CM | POA: Diagnosis not present

## 2021-03-11 DIAGNOSIS — Z800 Family history of malignant neoplasm of digestive organs: Secondary | ICD-10-CM

## 2021-03-11 DIAGNOSIS — R1011 Right upper quadrant pain: Secondary | ICD-10-CM

## 2021-03-11 MED ORDER — CLENPIQ 10-3.5-12 MG-GM -GM/160ML PO SOLN: 1.0000 | ORAL | 0 refills | Status: DC

## 2021-03-11 NOTE — Patient Instructions (Addendum)
If you are age 52 or older, your body mass index should be between 23-30. Your Body mass index is 34.84 kg/m. If this is out of the aforementioned range listed, please consider follow up with your Primary Care Provider.  If you are age 33 or younger, your body mass index should be between 19-25. Your Body mass index is 34.84 kg/m. If this is out of the aformentioned range listed, please consider follow up with your Primary Care Provider.   __________________________________________________________  The [facility] providers would like to encourage you to use [facility] to communicate with providers for non-urgent requests or questions.  Due to long hold times on the telephone, sending your provider a message by [facility] may be a faster and more efficient way to get a response.  Please allow 48 business hours for a response.  Please remember that this is for non-urgent requests.   _________________________________________________________  We have sent the following medications to your pharmacy for you to pick up at your convenience:  Clenpiq- for Colonoscopy  Due to recent changes in healthcare laws, you may see the results of your imaging and laboratory studies on MyChart before your provider has had a chance to review them.  We understand that in some cases there may be results that are confusing or concerning to you. Not all laboratory results come back in the same time frame and the provider may be waiting for multiple results in order to interpret others.  Please give [place] 48 hours in order for your provider to thoroughly review all the results before contacting the office for clarification of your results.   YOU WILL BE CONTACTED BY CENTRAL SCHEDULING AT [place] TO SET UP AN MRI OF YOUR ABDOMEN.   Thank you for choosing me and [facility].  Vito Cirigliano, D.O.

## 2021-03-11 NOTE — Progress Notes (Signed)
Chief Complaint: GERD, dysphagia, abdominal bloating, RUQ pain   Referring Provider:     [name], MD   HPI:     [name] is a 50 y.o. male with a history of HTN, hyperlipidemia, referred to the Gastroenterology Clinic for evaluation of multiple GI symptoms, to include reflux symptoms, dysphagia, RUQ pain, abdominal bloating.  History of longstanding history of reflux symptoms for 20+ years, but worsening over the last 2 months or so.  Previously treated with Dexilant and omeprazole, but eventually titrated off of these.  Symptoms started to recur over the last 2 months, described as heartburn, substernal chest pain, regurgitation. Re-started Protonix 40 mg/day 2 weeks ago.  Does have intermittent solid food dysphagia over the last couple of months as well.  Separately, endorses abdominal bloating and RUQ pain starting a couple of months ago. Seemingly occurs at random and not related to p.o. intake.  No changes in bowel habits, hematochezia, melena.   Family history notable for sister with colon cancer, deceased in her 48s.   July 06, 2016: MRI abdomen: 1.2 cm liver cyst with lobulated contour and thin internal septation.  Recommended repeat MRI in 12 months. - 09/2020: Normal CMP   Endoscopic History: - EGD (12/2013): Erosive esophagitis, small hiatal hernia - Colonoscopy (12/2013): 1 cm polyp (tubular adenoma) in descending colon, diminutive polyp (tubular adenoma) at splenic flexure - Colonoscopy (08/2016): Poor prep   Past Medical History:  Diagnosis Date   Atypical chest pain    GERD (gastroesophageal reflux disease)    Hepatic cyst    Benign by MRI   Hiatal hernia    History of colonic polyps    Hyperlipidemia    Hypertension    Rectal bleeding    Tobacco abuse      Past Surgical History:  Procedure Laterality Date   COLONOSCOPY  01/10/2009     RMR: Normal rectum, normal colon; repeat in 2015 due to [place] of colon cancer   COLONOSCOPY N/A  01/09/2014   [id number] colonic polyps-removed as described   COLONOSCOPY N/A 09/18/2016   Procedure: COLONOSCOPY;  Surgeon: Daneil Dolin, MD;  [facility];  Service: Endoscopy;  Laterality: N/A;  730    ESOPHAGOGASTRODUODENOSCOPY  10/04/2007   RMR: Distal esophageal erosions consistent with erosive reflux esophagitis, patulous gastroesophageal junction status post passage of a  Maloney dilator, 12 [place].  Otherwise, unremarkable esophagus.  Hiatal hernia.  Otherwise normal stomach.  Bulbar erosion   ESOPHAGOGASTRODUODENOSCOPY N/A 01/09/2014   Erosive reflux esophagitis. Small hiatal hernia   I & D EXTREMITY Left 07/26/2018   Procedure: IRRIGATION AND DEBRIDEMENT EXTREMITY;  Surgeon: Dayna Barker, MD;  Location: [place];  Service: Plastics;  Laterality: Left;   PERCUTANEOUS PINNING Left 07/26/2018   Procedure: PERCUTANEOUS PINNING EXTREMITY;  Surgeon: Dayna Barker, MD;  Location: [place];  Service: Plastics;  Laterality: Left;   Family History  Problem Relation Age of Onset   Heart disease Mother    Hypertension Mother    Diabetes Father    Hypertension Father    Colon cancer Sister        passed away from colon cancer, in her 29s   Heart attack Maternal Uncle    Prostate cancer Maternal Uncle    Diabetes Other    Pancreatic cancer Neg Hx    Rectal cancer Neg Hx    Social History   Tobacco Use   Smoking status: Every Day  Packs/day: 0.25    Years: 10.00    Pack years: 2.50    Types: Cigarettes   Smokeless tobacco: Never  Vaping Use   Vaping Use: Never used  Substance Use Topics   Alcohol use: Yes    Alcohol/week: 0.0 standard drinks    Comment: occasional/wine on the weekends   Drug use: No   Current Outpatient Medications  Medication Sig Dispense Refill   cyclobenzaprine (FLEXERIL) 10 MG tablet Take 1 tablet (10 mg total) by mouth 3 (three) times daily as needed for muscle spasms. 30 tablet 0   hydrochlorothiazide (HYDRODIURIL) 25 MG tablet Take 1 tablet  (25 mg total) by mouth daily. 90 tablet 0   lisinopril (ZESTRIL) 40 MG tablet Take 1 tablet (40 mg total) by mouth daily. 90 tablet 1   nicotine polacrilex (NICORETTE) 4 MG gum Take 1 each (4 mg total) by mouth as needed for smoking cessation (every 2 hours). 100 tablet 0   pantoprazole (PROTONIX) 40 MG tablet Take 1 tablet (40 mg total) by mouth daily. 30 tablet 11   rosuvastatin (CRESTOR) 10 MG tablet Take 1 tablet (10 mg total) by mouth daily. 90 tablet 3   No current facility-administered medications for this visit.   No Known Allergies   Review of Systems: All systems reviewed and negative except where noted in HPI.     Physical Exam:    Wt Readings from Last 3 Encounters:  03/11/21 248 lb 6 oz (112.7 kg)  02/27/21 253 lb (114.8 kg)  10/02/20 241 lb (109.3 kg)    BP 138/90   Pulse (!) 108   Ht 5' 10.8" (1.798 m)   Wt 248 lb 6 oz (112.7 kg)   SpO2 98%   BMI 34.84 kg/m  Constitutional:  Pleasant, in no acute distress. Psychiatric: Normal mood and affect. Behavior is normal. Cardiovascular: Normal rate, regular rhythm. No edema Pulmonary/chest: Effort normal and breath sounds normal. No wheezing, rales or rhonchi. Abdominal: Mild generalized TTP without rebound or guarding.  No peritoneal signs.  Soft, nondistended. Bowel sounds active throughout. There are no masses palpable. No hepatomegaly. Neurological: Alert and oriented to person place and time. Skin: Skin is warm and dry. No rashes noted.   ASSESSMENT AND PLAN;   1) GERD with history of erosive esophagitis 2) Hiatal hernia 3) Dysphagia - EGD to evaluate for erosive esophagitis, change in hernia size, gauge degree of LES laxity - Esophageal dilation and biopsies as appropriate - Continue cutting food into small pieces, chewing thoroughly, and drink plenty of fluids with meals - Continue Protonix as recently prescribed - Continue antireflux lifestyle/dietary modifications  4) RUQ pain 5) Abdominal  bloating - Evaluate for mucosal/luminal pathology at the time of EGD with biopsies as appropriate - Evaluate for extraluminal pathology at time of MRI liver as below  6) History of hepatic cyst - MRI liver  7) Family history of colon cancer 8) Personal history of colon polyps - Colonoscopy for ongoing surveillance  The indications, risks, and benefits of EGD and colonoscopy were explained to the patient in detail. Risks include but are not limited to bleeding, perforation, adverse reaction to medications, and cardiopulmonary compromise. Sequelae include but are not limited to the possibility of surgery, hospitalization, and mortality. The patient verbalized understanding and wished to proceed. All questions answered, referred to scheduler and bowel prep ordered. Further recommendations pending results of the exam.     Lavena Bullion, DO, FACG  03/11/2021, 1:35 PM   [name], MD

## 2021-04-16 IMAGING — DX DG CHEST 2V
2 series · 2 of 2 positions shown · non-contrast
Comparison: 03/03/2017 and earlier.

CLINICAL DATA: 48-year-old current smoker presenting with a 2 day
history of ANTERIOR UPPER chest pain and headaches.

EXAM:
CHEST - 2 VIEW

[chest pa]
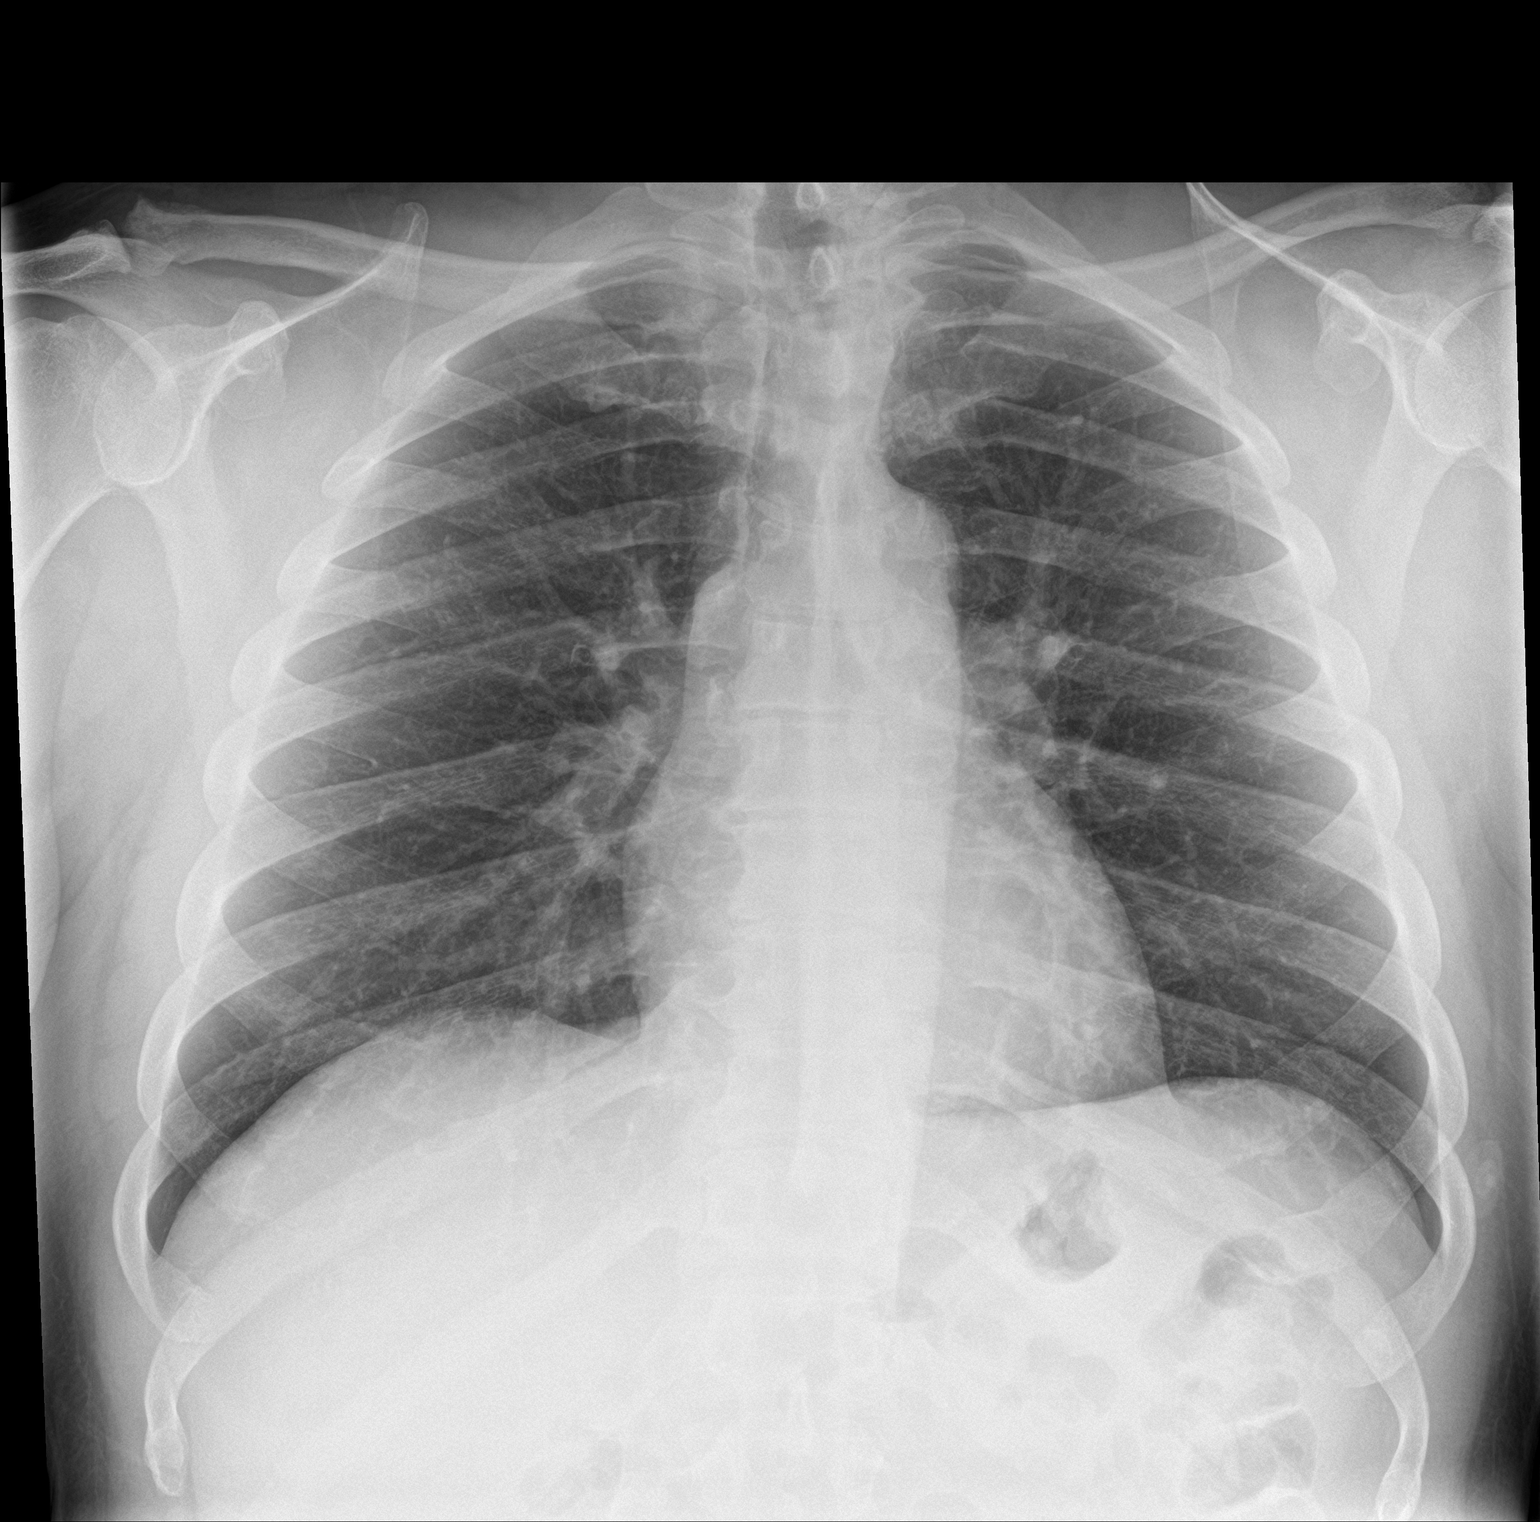

[chest lat]
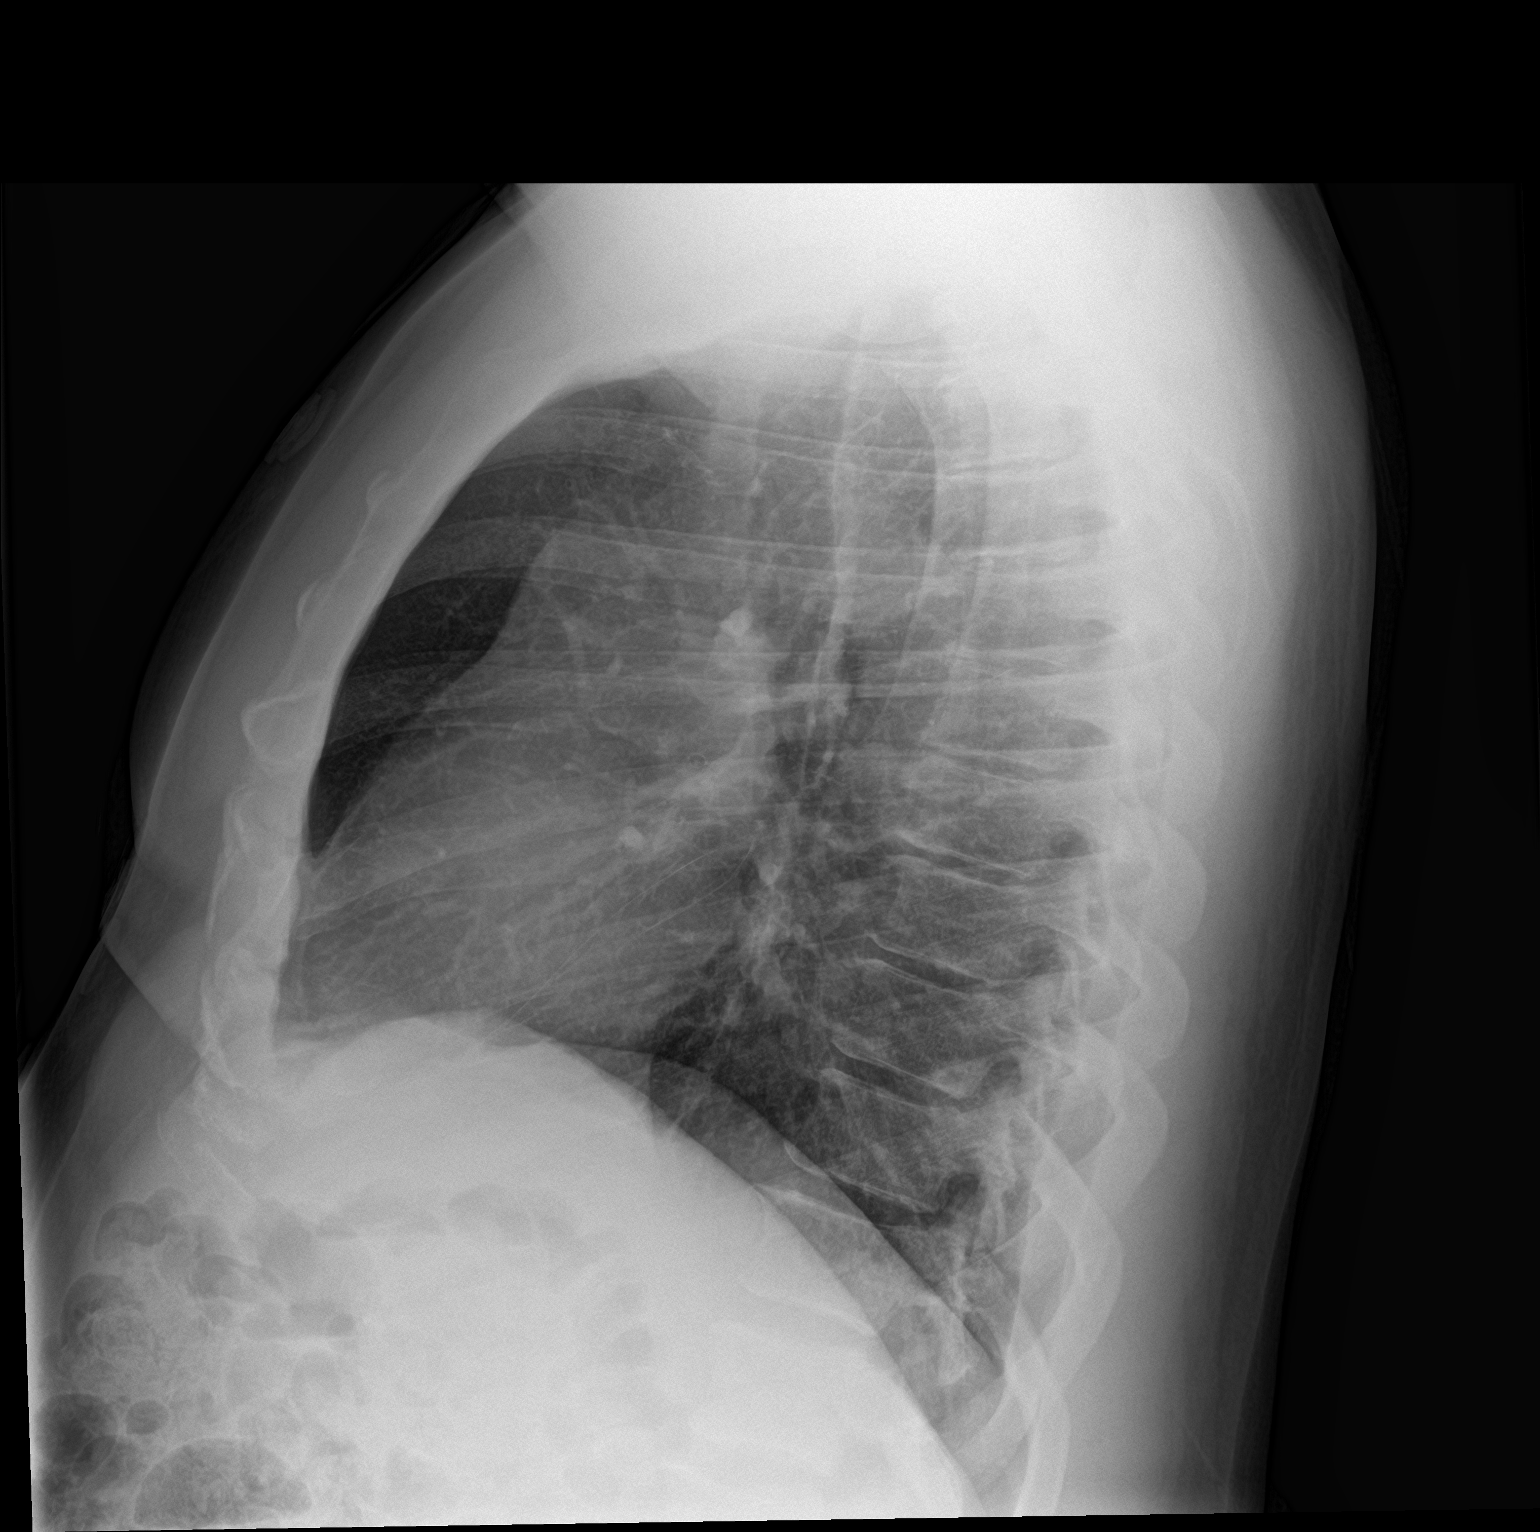

[2 of 2 positions shown; findings below may reference images not displayed]

FINDINGS: Cardiomediastinal silhouette unremarkable and unchanged. Lungs
clear. Bronchovascular markings normal. Pulmonary vascularity
normal. No visible pleural effusions. No pneumothorax. Mild
degenerative changes involving the thoracic spine. No interval
change.
IMPRESSION: No acute cardiopulmonary disease. Stable examination.

## 2021-04-17 ENCOUNTER — Encounter: Payer: BC Managed Care – PPO | Admitting: Gastroenterology

## 2021-05-07 ENCOUNTER — Telehealth: Payer: Self-pay

## 2021-05-07 ENCOUNTER — Other Ambulatory Visit: Payer: Self-pay | Admitting: Family

## 2021-05-07 DIAGNOSIS — K219 Gastro-esophageal reflux disease without esophagitis: Secondary | ICD-10-CM

## 2021-05-07 NOTE — Telephone Encounter (Signed)
Letter has been sent to patient instructing them to call us if they are still interested in completing their sleep study. If we have not received a response from the patient within 30 days of this notice, the order will be cancelled and they will need to discuss the need for a sleep study at their next office visit.  ° °

## 2021-05-08 ENCOUNTER — Encounter (HOSPITAL_COMMUNITY): Payer: Self-pay

## 2021-05-08 ENCOUNTER — Emergency Department (HOSPITAL_COMMUNITY): Payer: BC Managed Care – PPO

## 2021-05-08 ENCOUNTER — Other Ambulatory Visit: Payer: Self-pay

## 2021-05-08 ENCOUNTER — Emergency Department (HOSPITAL_COMMUNITY)
Admission: EM | Admit: 2021-05-08 | Discharge: 2021-05-09 | Disposition: A | Discharge status: Home / Self Care | Payer: BC Managed Care – PPO | Attending: Student | Admitting: Student

## 2021-05-08 ENCOUNTER — Ambulatory Visit (INDEPENDENT_AMBULATORY_CARE_PROVIDER_SITE_OTHER)
Admission: EM | Admit: 2021-05-08 | Discharge: 2021-05-08 | Disposition: A | Discharge status: Home / Self Care | Payer: BC Managed Care – PPO | Source: Home / Self Care

## 2021-05-08 DIAGNOSIS — B349 Viral infection, unspecified: Secondary | ICD-10-CM

## 2021-05-08 DIAGNOSIS — R070 Pain in throat: Secondary | ICD-10-CM | POA: Insufficient documentation

## 2021-05-08 DIAGNOSIS — R1010 Upper abdominal pain, unspecified: Secondary | ICD-10-CM | POA: Diagnosis not present

## 2021-05-08 DIAGNOSIS — R1011 Right upper quadrant pain: Secondary | ICD-10-CM | POA: Diagnosis not present

## 2021-05-08 DIAGNOSIS — Z5321 Procedure and treatment not carried out due to patient leaving prior to being seen by health care provider: Secondary | ICD-10-CM | POA: Insufficient documentation

## 2021-05-08 DIAGNOSIS — R110 Nausea: Secondary | ICD-10-CM | POA: Diagnosis not present

## 2021-05-08 DIAGNOSIS — Y900 Blood alcohol level of less than 20 mg/100 ml: Secondary | ICD-10-CM | POA: Insufficient documentation

## 2021-05-08 DIAGNOSIS — R051 Acute cough: Secondary | ICD-10-CM | POA: Insufficient documentation

## 2021-05-08 DIAGNOSIS — Z79899 Other long term (current) drug therapy: Secondary | ICD-10-CM | POA: Insufficient documentation

## 2021-05-08 DIAGNOSIS — U071 COVID-19: Secondary | ICD-10-CM | POA: Insufficient documentation

## 2021-05-08 DIAGNOSIS — K760 Fatty (change of) liver, not elsewhere classified: Secondary | ICD-10-CM | POA: Diagnosis not present

## 2021-05-08 DIAGNOSIS — R052 Subacute cough: Secondary | ICD-10-CM

## 2021-05-08 LAB — COMPREHENSIVE METABOLIC PANEL
ALT: 41 U/L (ref 0–44)
AST: 46 U/L — ABNORMAL HIGH (ref 15–41)
Albumin: 3.7 g/dL (ref 3.5–5.0)
Alkaline Phosphatase: 54 U/L (ref 38–126)
Anion gap: 8 (ref 5–15)
BUN: 13 mg/dL (ref 6–20)
CO2: 27 mmol/L (ref 22–32)
Calcium: 9.1 mg/dL (ref 8.9–10.3)
Chloride: 97 mmol/L — ABNORMAL LOW (ref 98–111)
Creatinine, Ser: 1.15 mg/dL (ref 0.61–1.24)
GFR, Estimated: >60 mL/min (ref >60)
Glucose, Bld: 110 mg/dL — ABNORMAL HIGH (ref 70–99)
Potassium: 4.4 mmol/L (ref 3.5–5.1)
Sodium: 132 mmol/L — ABNORMAL LOW (ref 135–145)
Total Bilirubin: 0.5 mg/dL (ref 0.3–1.2)
Total Protein: 8.2 g/dL — ABNORMAL HIGH (ref 6.5–8.1)

## 2021-05-08 LAB — ETHANOL: Alcohol, Ethyl (B): <10 mg/dL (ref <10)

## 2021-05-08 LAB — CBC WITH DIFFERENTIAL/PLATELET
Abs Immature Granulocytes: 0.02 10*3/uL (ref 0.00–0.07)
Basophils Absolute: 0 10*3/uL (ref 0.0–0.1)
Basophils Relative: 1 %
Eosinophils Absolute: 0.1 10*3/uL (ref 0.0–0.5)
Eosinophils Relative: 1 %
HCT: 46.9 % (ref 39.0–52.0)
Hemoglobin: 15.5 g/dL (ref 13.0–17.0)
Immature Granulocytes: 0 %
Lymphocytes Relative: 41 %
Lymphs Abs: 2.4 10*3/uL (ref 0.7–4.0)
MCH: 29.6 pg (ref 26.0–34.0)
MCHC: 33 g/dL (ref 30.0–36.0)
MCV: 89.5 fL (ref 80.0–100.0)
Monocytes Absolute: 0.7 10*3/uL (ref 0.1–1.0)
Monocytes Relative: 12 %
Neutro Abs: 2.7 10*3/uL (ref 1.7–7.7)
Neutrophils Relative %: 45 %
Platelets: 217 10*3/uL (ref 150–400)
RBC: 5.24 MIL/uL (ref 4.22–5.81)
RDW: 12.8 % (ref 11.5–15.5)
WBC: 6 10*3/uL (ref 4.0–10.5)
nRBC: 0 % (ref 0.0–0.2)

## 2021-05-08 LAB — LIPASE, BLOOD: Lipase: 40 U/L (ref 11–51)

## 2021-05-08 LAB — URINALYSIS, ROUTINE W REFLEX MICROSCOPIC
Bilirubin Urine: NEGATIVE
Glucose, UA: NEGATIVE mg/dL
Hgb urine dipstick: NEGATIVE
Ketones, ur: NEGATIVE mg/dL
Leukocytes,Ua: NEGATIVE
Nitrite: NEGATIVE
Protein, ur: NEGATIVE mg/dL
Specific Gravity, Urine: 1.025 (ref 1.005–1.030)
pH: 6 (ref 5.0–8.0)

## 2021-05-08 LAB — POC INFLUENZA A AND B ANTIGEN (URGENT CARE ONLY)
INFLUENZA A ANTIGEN, POC: NEGATIVE
INFLUENZA B ANTIGEN, POC: NEGATIVE

## 2021-05-08 MED ORDER — PSEUDOEPHEDRINE HCL 60 MG PO TABS: 60.0000 mg | ORAL_TABLET | Freq: Three times a day (TID) | ORAL | 0 refills | Status: DC | PRN

## 2021-05-08 MED ORDER — BENZONATATE 100 MG PO CAPS
100.0000 mg | ORAL_CAPSULE | Freq: Three times a day (TID) | ORAL | 0 refills | Status: DC | PRN
Start: 2021-05-08 — End: 2021-08-20

## 2021-05-08 MED ORDER — CETIRIZINE HCL 10 MG PO TABS: 10.0000 mg | ORAL_TABLET | Freq: Every day | ORAL | 0 refills | Status: DC

## 2021-05-08 MED ORDER — PROMETHAZINE-DM 6.25-15 MG/5ML PO SYRP: 5.0000 mL | ORAL_SOLUTION | Freq: Every evening | ORAL | 0 refills | Status: DC | PRN

## 2021-05-08 NOTE — ED Notes (Signed)
Pt states they are leaving  

## 2021-05-08 NOTE — Discharge Instructions (Addendum)
We will notify you of your test results as they arrive and may take between 48-72 hours.  I encourage you to sign up for MyChart if you have not already done so as this can be the easiest way for us to communicate results to you online or through a phone app.  Generally, we only contact you if it is a positive test result.  In the meantime, if you develop worsening symptoms including fever, chest pain, shortness of breath despite our current treatment plan then please report to the emergency room as this may be a sign of worsening status from possible viral infection.  Otherwise, we will manage this as a viral syndrome. For sore throat or cough try using a honey-based tea. Use 3 teaspoons of honey with juice squeezed from half lemon. Place shaved pieces of ginger into 1/2-1 cup of water and warm over stove top. Then mix the ingredients and repeat every 4 hours as needed. Please take Tylenol 500mg-650mg every 6 hours for aches and pains, fevers. Hydrate very well with at least 2 liters of water. Eat light meals such as soups to replenish electrolytes and soft fruits, veggies. Start an antihistamine like Zyrtec for postnasal drainage, sinus congestion.  You can take this together with pseudoephedrine (Sudafed) at a dose of 60 mg 2-3 times a day as needed for the same kind of congestion.   

## 2021-05-08 NOTE — ED Provider Notes (Signed)
Emergency Medicine Provider Triage Evaluation Note  [name] , a 50 y.o. male  was evaluated in triage.  Pt complains of nausea, upper abdominal pain.  Had symptoms this weekend, returned again today.  Also reports URI symptoms.  No fever.  No change in urination or bowel movements  Review of Systems  Positive: Nausea, epig/RUQ abd pain Negative: fever  Physical Exam  BP (!) 139/94 (BP Location: Right Arm)    Pulse (!) 102    Temp 99.5 F (37.5 C) (Oral)    Resp 18    SpO2 98%  Gen:   Awake, no distress   Resp:  Normal effort  MSK:   Moves extremities without difficulty  Other:  Mild ttp of upper abd  Medical Decision Making  Medically screening exam initiated at 6:45 PM.  Appropriate orders placed.  KAHRON KAUTH was informed that the remainder of the evaluation will be completed by another provider, this initial triage assessment does not replace that evaluation, and the importance of remaining in the ED until their evaluation is complete.  Labs, [place]   [place], [place], [place] 05/08/21 1845    Wyvonnia Dusky, MD 05/09/21 [phone]

## 2021-05-08 NOTE — ED Triage Notes (Signed)
Pt c/o cough, congestion, body aches, headache, nausea, and chills since Sunday.

## 2021-05-08 NOTE — ED Provider Notes (Signed)
[facility]   MRN: [id number] DOB: 27-Feb-1971  Subjective:   [name] is a 50 y.o. male presenting for 3-day history of acute onset coughing, body aches, congestion, sinus headaches, nausea without vomiting, chills, malaise and fatigue.  No history of heart disease, kidney disease.  No active chest pain, shortness of breath or wheezing.  No current facility-administered medications for this encounter.  Current Outpatient Medications:    cyclobenzaprine (FLEXERIL) 10 MG tablet, Take 1 tablet (10 mg total) by mouth 3 (three) times daily as needed for muscle spasms., Disp: 30 tablet, Rfl: 0   hydrochlorothiazide (HYDRODIURIL) 25 MG tablet, Take 1 tablet (25 mg total) by mouth daily., Disp: 90 tablet, Rfl: 0   lisinopril (ZESTRIL) 40 MG tablet, Take 1 tablet (40 mg total) by mouth daily., Disp: 90 tablet, Rfl: 1   nicotine polacrilex (NICORETTE) 4 MG gum, Take 1 each (4 mg total) by mouth as needed for smoking cessation (every 2 hours)., Disp: 100 tablet, Rfl: 0   pantoprazole (PROTONIX) 40 MG tablet, Take 1 tablet (40 mg total) by mouth daily., Disp: 30 tablet, Rfl: 11   rosuvastatin (CRESTOR) 10 MG tablet, Take 1 tablet (10 mg total) by mouth daily., Disp: 90 tablet, Rfl: 3   Sod Picosulfate-Mag Ox-Cit Acd (CLENPIQ) 10-3.5-12 MG-GM -GM/160ML SOLN, Take 1 kit by mouth as directed., Disp: 320 mL, Rfl: 0   No Known Allergies  Past Medical History:  Diagnosis Date   Atypical chest pain    GERD (gastroesophageal reflux disease)    Hepatic cyst    Benign by MRI   Hiatal hernia    History of colonic polyps    Hyperlipidemia    Hypertension    Rectal bleeding    Tobacco abuse      Past Surgical History:  Procedure Laterality Date   COLONOSCOPY  01/10/2009     RMR: Normal rectum, normal colon; repeat in 2015 due to [place] of colon cancer   COLONOSCOPY N/A 01/09/2014   [id number] colonic polyps-removed as described   COLONOSCOPY N/A 09/18/2016   Procedure:  COLONOSCOPY;  Surgeon: Corbin Ade, MD;  [facility];  Service: Endoscopy;  Laterality: N/A;  730    ESOPHAGOGASTRODUODENOSCOPY  10/04/2007   RMR: Distal esophageal erosions consistent with erosive reflux esophagitis, patulous gastroesophageal junction status post passage of a  Maloney dilator, 56 [place].  Otherwise, unremarkable esophagus.  Hiatal hernia.  Otherwise normal stomach.  Bulbar erosion   ESOPHAGOGASTRODUODENOSCOPY N/A 01/09/2014   Erosive reflux esophagitis. Small hiatal hernia   I & D EXTREMITY Left 07/26/2018   Procedure: IRRIGATION AND DEBRIDEMENT EXTREMITY;  Surgeon: Knute Neu, MD;  [facility];  Service: Plastics;  Laterality: Left;   PERCUTANEOUS PINNING Left 07/26/2018   Procedure: PERCUTANEOUS PINNING EXTREMITY;  Surgeon: Knute Neu, MD;  Location[facility];  Service: Plastics;  Laterality: Left;    Family History  Problem Relation Age of Onset   Heart disease Mother    Hypertension Mother    Diabetes Father    Hypertension Father    Colon cancer Sister        passed away from colon cancer, in her 76s   Heart attack Maternal Uncle    Prostate cancer Maternal Uncle    Diabetes Other    Pancreatic cancer Neg Hx    Rectal cancer Neg Hx     Social History   Tobacco Use   Smoking status: Every Day    Packs/day: 0.25  Years: 10.00    Pack years: 2.50    Types: Cigarettes   Smokeless tobacco: Never  Vaping Use   Vaping Use: Never used  Substance Use Topics   Alcohol use: Yes    Alcohol/week: 0.0 standard drinks    Comment: occasional/wine on the weekends   Drug use: No    ROS   Objective:   Vitals: BP (!) 148/89 (BP Location: Left Arm)    Pulse (!) 101    Temp 99.2 F (37.3 C) (Oral)    Resp 18    SpO2 97%   Physical Exam Constitutional:      General: He is not in acute distress.    Appearance: Normal appearance. He is well-developed and normal weight. He is not ill-appearing, toxic-appearing or diaphoretic.  HENT:      Head: Normocephalic and atraumatic.     Right Ear: Tympanic membrane, ear canal and external ear normal. There is no impacted cerumen.     Left Ear: Tympanic membrane, ear canal and external ear normal. There is no impacted cerumen.     Nose: Congestion present. No rhinorrhea.     Mouth/Throat:     Mouth: Mucous membranes are moist.     Pharynx: No oropharyngeal exudate or posterior oropharyngeal erythema.  Eyes:     General: No scleral icterus.       Right eye: No discharge.        Left eye: No discharge.     Extraocular Movements: Extraocular movements intact.     Conjunctiva/sclera: Conjunctivae normal.     Pupils: Pupils are equal, round, and reactive to light.  Cardiovascular:     Rate and Rhythm: Normal rate and regular rhythm.     Heart sounds: Normal heart sounds. No murmur heard.   No friction rub. No gallop.  Pulmonary:     Effort: Pulmonary effort is normal. No respiratory distress.     Breath sounds: Normal breath sounds. No stridor. No wheezing, rhonchi or rales.  Musculoskeletal:     Cervical back: Normal range of motion and neck supple. No rigidity. No muscular tenderness.  Neurological:     General: No focal deficit present.     Mental Status: He is alert and oriented to person, place, and time.  Psychiatric:        Mood and Affect: Mood normal.        Behavior: Behavior normal.        Thought Content: Thought content normal.    Results for orders placed or performed during the hospital encounter of 05/08/21 (from the past 24 hour(s))  POC Influenza A & B Ag (Urgent Care)     Status: None   Collection Time: 05/08/21  3:27 PM  Result Value Ref Range   INFLUENZA A ANTIGEN, POC NEGATIVE NEGATIVE   INFLUENZA B ANTIGEN, POC NEGATIVE NEGATIVE    Assessment and Plan :   PDMP not reviewed this encounter.  1. Acute viral syndrome   2. Subacute cough   3. Throat pain    Deferred imaging given clear cardiopulmonary exam, hemodynamically stable vital signs. Will  manage for viral illness such as viral URI, viral syndrome, viral rhinitis, COVID-19. Recommended supportive care. Offered scripts for symptomatic relief. Testing is pending. Counseled patient on potential for adverse effects with medications prescribed/recommended today, ER and return-to-clinic precautions discussed, patient verbalized understanding.     Jaynee Eagles, PA-C 05/08/21 1540

## 2021-05-08 NOTE — ED Triage Notes (Signed)
Pt presents with abdominal pain, upper, that has been intermittent but hit him today while he was picking up meds after a visit to UC for URI symptoms.  Nausea but no vomiting or diarrhea.

## 2021-05-09 ENCOUNTER — Encounter: Payer: Self-pay | Admitting: Family Medicine

## 2021-05-09 LAB — SARS CORONAVIRUS 2 (TAT 6-24 HRS): SARS Coronavirus 2: POSITIVE — AB

## 2021-05-29 ENCOUNTER — Encounter: Payer: Self-pay | Admitting: Certified Registered Nurse Anesthetist

## 2021-05-30 ENCOUNTER — Other Ambulatory Visit: Payer: Self-pay

## 2021-05-30 ENCOUNTER — Ambulatory Visit (AMBULATORY_SURGERY_CENTER): Payer: BC Managed Care – PPO | Admitting: Gastroenterology

## 2021-05-30 ENCOUNTER — Encounter: Payer: Self-pay | Admitting: Gastroenterology

## 2021-05-30 VITALS — BP 144/92 | HR 96 | Temp 97.7°F | Resp 19 | Ht 70.0 in | Wt 248.0 lb

## 2021-05-30 DIAGNOSIS — K2970 Gastritis, unspecified, without bleeding: Secondary | ICD-10-CM | POA: Diagnosis not present

## 2021-05-30 DIAGNOSIS — K2990 Gastroduodenitis, unspecified, without bleeding: Secondary | ICD-10-CM

## 2021-05-30 DIAGNOSIS — D122 Benign neoplasm of ascending colon: Secondary | ICD-10-CM

## 2021-05-30 DIAGNOSIS — Z1211 Encounter for screening for malignant neoplasm of colon: Secondary | ICD-10-CM

## 2021-05-30 DIAGNOSIS — K625 Hemorrhage of anus and rectum: Secondary | ICD-10-CM | POA: Diagnosis not present

## 2021-05-30 DIAGNOSIS — K219 Gastro-esophageal reflux disease without esophagitis: Secondary | ICD-10-CM

## 2021-05-30 DIAGNOSIS — Z86010 Personal history of colonic polyps: Secondary | ICD-10-CM | POA: Diagnosis not present

## 2021-05-30 DIAGNOSIS — Z800 Family history of malignant neoplasm of digestive organs: Secondary | ICD-10-CM | POA: Diagnosis not present

## 2021-05-30 DIAGNOSIS — D125 Benign neoplasm of sigmoid colon: Secondary | ICD-10-CM | POA: Diagnosis not present

## 2021-05-30 DIAGNOSIS — R1319 Other dysphagia: Secondary | ICD-10-CM | POA: Diagnosis not present

## 2021-05-30 DIAGNOSIS — K3189 Other diseases of stomach and duodenum: Secondary | ICD-10-CM | POA: Diagnosis not present

## 2021-05-30 DIAGNOSIS — R1011 Right upper quadrant pain: Secondary | ICD-10-CM

## 2021-05-30 DIAGNOSIS — R140 Abdominal distension (gaseous): Secondary | ICD-10-CM

## 2021-05-30 MED ORDER — SODIUM CHLORIDE 0.9 % IV SOLN: 500.0000 mL | Freq: Once | INTRAVENOUS | Status: DC

## 2021-05-30 NOTE — Op Note (Signed)
[facility] Patient Name: [name] Procedure Date: 05/30/2021 10:50 AM MRN: [id number] Endoscopist: Gerrit Heck , MD Age: 51 Referring MD:  Date of Birth: 28-Jan-1971 Gender: Male Account #: [id number] Procedure:                Upper GI endoscopy Indications:              Dysphagia, Esophageal reflux with prior history of                            erosive esophagitis, Abdominal bloating Medicines:                Monitored Anesthesia Care Procedure:                Pre-Anesthesia Assessment:                           - Prior to the procedure, a History and Physical                            was performed, and patient medications and                            allergies were reviewed. The patient's tolerance of                            previous anesthesia was also reviewed. The risks                            and benefits of the procedure and the sedation                            options and risks were discussed with the patient.                            All questions were answered, and informed consent                            was obtained. Prior Anticoagulants: The patient has                            taken no previous anticoagulant or antiplatelet                            agents. ASA Grade Assessment: II - A patient with                            mild systemic disease. After reviewing the risks                            and benefits, the patient was deemed in                            satisfactory condition to undergo the procedure.  After obtaining informed consent, the endoscope was                            passed under direct vision. Throughout the                            procedure, the patient's blood pressure, pulse, and                            oxygen saturations were monitored continuously. The                            Endoscope was introduced through the mouth, and                            advanced to the  second part of duodenum. The upper                            GI endoscopy was accomplished without difficulty.                            The patient tolerated the procedure well. Scope In: Scope Out: Findings:                 A single area of ectopic gastric mucosa was found                            in the upper third of the esophagus.                           The upper third of the esophagus, middle third of                            the esophagus and lower third of the esophagus were                            otherwise normal. Given the symptoms of solid food                            dysphagia, the decision was made to perform empiric                            esophageal dilation. The scope was withdrawn.                            Dilation was performed with a Maloney dilator with                            no resistance at 32 Fr. The dilation site was                            examined following endoscope reinsertion and showed  no bleeding, mucosal tear or perforation. Biopsies                            were obtained from the proximal and distal                            esophagus with cold forceps for histology of                            suspected eosinophilic esophagitis. Estimated blood                            loss was minimal.                           The Z-line was irregular and was found 38 cm from                            the incisors, charactized by a 0.5 cm segment of                            circunferential salmon colored mucosa and increased                            capillary visualization. This was visualized with                            standard white light and narrow band imaging (NBI).                            Biopsies were taken with a cold forceps for                            histology. Estimated blood loss was minimal.                           Scattered mild inflammation characterized by                             erythema was found in the gastric fundus and in the                            gastric body. Biopsies were taken with a cold                            forceps for Helicobacter pylori testing. Estimated                            blood loss was minimal.                           The examined duodenum was normal. Biopsies were  taken with a cold forceps for histology. Estimated                            blood loss was minimal. Complications:            No immediate complications. Estimated Blood Loss:     Estimated blood loss was minimal. Impression:               - Ectopic gastric mucosa in the upper third of the                            esophagus.                           - Otherwise normal upper third of esophagus, middle                            third of esophagus and lower third of esophagus.                            Dilated. Biopsied.                           - Z-line irregular, 38 cm from the incisors.                            Biopsied.                           - Gastritis. Biopsied.                           - Normal examined duodenum. Biopsied. Recommendation:           - Patient has a contact number available for                            emergencies. The signs and symptoms of potential                            delayed complications were discussed with the                            patient. Return to normal activities tomorrow.                            Written discharge instructions were provided to the                            patient.                           - Resume previous diet.                           - Continue present medications.                           -  Await pathology results.                           - Repeat upper endoscopy PRN.                           - Perform a colonoscopy today. Gerrit Heck, MD 05/30/2021 11:42:06 AM

## 2021-05-30 NOTE — Progress Notes (Signed)
Report given to PACU, vss 

## 2021-05-30 NOTE — Patient Instructions (Addendum)
Resume previous medications.  4 polyps removed and sent to pathology.  Multiple biopsies taken of esophagus and stomach. Await results for final recommendations.  Handouts on findings given to patient (gastritis and polyps)  YOU HAD AN ENDOSCOPIC PROCEDURE TODAY AT [facility]:   Refer to the procedure report that was given to you for any specific questions about what was found during the examination.  If the procedure report does not answer your questions, please call your gastroenterologist to clarify.  If you requested that your care partner not be given the details of your procedure findings, then the procedure report has been included in a sealed envelope for you to review at your convenience later.  YOU SHOULD EXPECT: Some feelings of bloating in the abdomen. Passage of more gas than usual.  Walking can help get rid of the air that was put into your GI tract during the procedure and reduce the bloating. If you had a lower endoscopy (such as a colonoscopy or flexible sigmoidoscopy) you may notice spotting of blood in your stool or on the toilet paper. If you underwent a bowel prep for your procedure, you may not have a normal bowel movement for a few days.  Please Note:  You might notice some irritation and congestion in your nose or some drainage.  This is from the oxygen used during your procedure.  There is no need for concern and it should clear up in a day or so.  SYMPTOMS TO REPORT IMMEDIATELY:  Following lower endoscopy (colonoscopy or flexible sigmoidoscopy):  Excessive amounts of blood in the stool  Significant tenderness or worsening of abdominal pains  Swelling of the abdomen that is new, acute  Fever of 100F or higher  Following upper endoscopy (EGD)  Vomiting of blood or coffee ground material  New chest pain or pain under the shoulder blades  Painful or persistently difficult swallowing  New shortness of breath  Fever of 100F or higher  Black, tarry-looking  stools  For urgent or emergent issues, a gastroenterologist can be reached at any hour by calling [phone]. Do not use MyChart messaging for urgent concerns.    DIET:  We do recommend a small meal at first, but then you may proceed to your regular diet.  Drink plenty of fluids but you should avoid alcoholic beverages for 24 hours.  ACTIVITY:  You should plan to take it easy for the rest of today and you should NOT DRIVE or use heavy machinery until tomorrow (because of the sedation medicines used during the test).    FOLLOW UP: Our staff will call the number listed on your records 48-72 hours following your procedure to check on you and address any questions or concerns that you may have regarding the information given to you following your procedure. If we do not reach you, we will leave a message.  We will attempt to reach you two times.  During this call, we will ask if you have developed any symptoms of COVID 19. If you develop any symptoms (ie: fever, flu-like symptoms, shortness of breath, cough etc.) before then, please call [phone].  If you test positive for Covid 19 in the 2 weeks post procedure, please call and report this information to [place].    If any biopsies were taken you will be contacted by phone or by letter within the next 1-3 weeks.  Please call us at [phone] if you have not heard about the biopsies in 3 weeks.  SIGNATURES/CONFIDENTIALITY: You and/or your care partner have signed paperwork which will be entered into your electronic medical record.  These signatures attest to the fact that that the information above on your After Visit Summary has been reviewed and is understood.  Full responsibility of the confidentiality of this discharge information lies with you and/or your care-partner.

## 2021-05-30 NOTE — Op Note (Signed)
[facility] Patient Name: [name] Procedure Date: 05/30/2021 10:43 AM MRN: [id number] Endoscopist: Gerrit Heck , MD Age: 51 Referring MD:  Date of Birth: 01-31-1971 Gender: Male Account #: [id number] Procedure:                Colonoscopy Indications:              Screening in patient at increased risk: Colorectal                            cancer in sister before age 99, Surveillance:                            Personal history of adenomatous polyps on last                            colonoscopy 5 years ago                           Colonoscopy (12/2013): 1 cm polyp (tubular adenoma)                            in descending colon, diminutive polyp (tubular                            adenoma) at splenic flexure                           Colonoscopy (08/2016): Poor prep Medicines:                Monitored Anesthesia Care Procedure:                Pre-Anesthesia Assessment:                           - Prior to the procedure, a History and Physical                            was performed, and patient medications and                            allergies were reviewed. The patient's tolerance of                            previous anesthesia was also reviewed. The risks                            and benefits of the procedure and the sedation                            options and risks were discussed with the patient.                            All questions were answered, and informed consent  was obtained. Prior Anticoagulants: The patient has                            taken no previous anticoagulant or antiplatelet                            agents. ASA Grade Assessment: II - A patient with                            mild systemic disease. After reviewing the risks                            and benefits, the patient was deemed in                            satisfactory condition to undergo the procedure.                           After  obtaining informed consent, the colonoscope                            was passed under direct vision. Throughout the                            procedure, the patient's blood pressure, pulse, and                            oxygen saturations were monitored continuously. The                            CF [id number] #[id number] was introduced through the anus                            and advanced to the the terminal ileum. The                            colonoscopy was performed without difficulty. The                            patient tolerated the procedure well. The quality                            of the bowel preparation was good. The terminal                            ileum, ileocecal valve, appendiceal orifice, and                            rectum were photographed. Scope In: 11:15:22 AM Scope Out: 11:35:31 AM Scope Withdrawal Time: 0 hours 18 minutes 28 seconds  Total Procedure Duration: 0 hours 20 minutes 9 seconds  Findings:                 The perianal and digital rectal examinations were  normal.                           Two sessile polyps were found in the ascending                            colon. The polyps were 3 to 4 mm in size. These                            polyps were removed with a cold snare. Resection                            and retrieval were complete. Estimated blood loss                            was minimal.                           Two sessile polyps were found in the sigmoid colon.                            The polyps were 3 to 4 mm in size. These polyps                            were removed with a cold snare. Resection and                            retrieval were complete. Estimated blood loss was                            minimal.                           The retroflexed view of the distal rectum and anal                            verge was normal and showed no anal or rectal                             abnormalities.                           The terminal ileum appeared normal. Complications:            No immediate complications. Estimated Blood Loss:     Estimated blood loss was minimal. Impression:               - Two 3 to 4 mm polyps in the ascending colon,                            removed with a cold snare. Resected and retrieved.                           - Two 3 to 4 mm polyps in the sigmoid colon,  removed with a cold snare. Resected and retrieved.                           - The distal rectum and anal verge are normal on                            retroflexion view.                           - The examined portion of the ileum was normal. Recommendation:           - Patient has a contact number available for                            emergencies. The signs and symptoms of potential                            delayed complications were discussed with the                            patient. Return to normal activities tomorrow.                            Written discharge instructions were provided to the                            patient.                           - Resume previous diet.                           - Continue present medications.                           - Await pathology results.                           - Repeat colonoscopy in 5 years for surveillance,                            or sooner based on pathology results.                           - Return to GI office PRN. Gerrit Heck, MD 05/30/2021 11:45:38 AM

## 2021-05-30 NOTE — Progress Notes (Signed)
GASTROENTEROLOGY PROCEDURE H&P NOTE   Primary Care Physician: [name], MD    Reason for Procedure:  GERD, dysphagia, abdominal bloating, RUQ pain, family history of colon cancer, history of colon polyps  Plan:    EGD, colonoscopy  Patient is appropriate for endoscopic procedure(s) in the ambulatory ([facility]) setting.  The nature of the procedure, as well as the risks, benefits, and alternatives were carefully and thoroughly reviewed with the patient. Ample time for discussion and questions allowed. The patient understood, was satisfied, and agreed to proceed.     HPI: [name] is a 51 y.o. male who presents for EGD for evaluation of GERD, dysphagia, RUQ pain, abdominal bloating, along with colonoscopy due to personal history of adenomatous polyps and family history of colon cancer (sister diagnosed with colon cancer and deceased in her 75s).  Endoscopic History: - EGD (12/2013): Erosive esophagitis, small hiatal hernia - Colonoscopy (12/2013): 1 cm polyp (tubular adenoma) in descending colon, diminutive polyp (tubular adenoma) at splenic flexure - Colonoscopy (08/2016): Poor prep  Past Medical History:  Diagnosis Date   Allergy    Atypical chest pain    GERD (gastroesophageal reflux disease)    Hepatic cyst    Benign by MRI   Hiatal hernia    History of colonic polyps    Hyperlipidemia    Hypertension    Rectal bleeding    Tobacco abuse     Past Surgical History:  Procedure Laterality Date   COLONOSCOPY  01/10/2009     RMR: Normal rectum, normal colon; repeat in 2015 due to [place] of colon cancer   COLONOSCOPY N/A 01/09/2014   FUX:NATFTDDU colonic polyps-removed as described   COLONOSCOPY N/A 09/18/2016   Procedure: COLONOSCOPY;  Surgeon: Daneil Dolin, MD;  Location: [facility];  Service: Endoscopy;  Laterality: N/A;  730    ESOPHAGOGASTRODUODENOSCOPY  10/04/2007   RMR: Distal esophageal erosions consistent with erosive reflux esophagitis, patulous  gastroesophageal junction status post passage of a  Maloney dilator, 63 [place].  Otherwise, unremarkable esophagus.  Hiatal hernia.  Otherwise normal stomach.  Bulbar erosion   ESOPHAGOGASTRODUODENOSCOPY N/A 01/09/2014   Erosive reflux esophagitis. Small hiatal hernia   I & D EXTREMITY Left 07/26/2018   Procedure: IRRIGATION AND DEBRIDEMENT EXTREMITY;  Surgeon: Dayna Barker, MD;  Location: [facility];  Service: Plastics;  Laterality: Left;   PERCUTANEOUS PINNING Left 07/26/2018   Procedure: PERCUTANEOUS PINNING EXTREMITY;  Surgeon: Dayna Barker, MD;  Location: [facility];  Service: Plastics;  Laterality: Left;    Prior to Admission medications   Medication Sig Start Date End Date Taking? Authorizing Provider  benzonatate (TESSALON) 100 MG capsule Take 1-2 capsules (100-200 mg total) by mouth 3 (three) times daily as needed for cough. 05/08/21  Yes Jaynee Eagles, PA-C  cetirizine (ZYRTEC ALLERGY) 10 MG tablet Take 1 tablet (10 mg total) by mouth daily. 05/08/21  Yes Jaynee Eagles, PA-C  hydrochlorothiazide (HYDRODIURIL) 25 MG tablet Take 1 tablet (25 mg total) by mouth daily. 11/22/20  Yes Minette Brine, Amy J, NP  lisinopril (ZESTRIL) 40 MG tablet Take 1 tablet (40 mg total) by mouth daily. 02/27/21  Yes Sande Rives E, PA-C  cyclobenzaprine (FLEXERIL) 10 MG tablet Take 1 tablet (10 mg total) by mouth 3 (three) times daily as needed for muscle spasms. 10/02/20   Nicolette Bang, MD  nicotine polacrilex (NICORETTE) 4 MG gum Take 1 each (4 mg total) by mouth as needed for smoking cessation (every 2 hours). Patient not taking: Reported on  05/30/2021 02/27/21   Sande Rives E, PA-C  pantoprazole (PROTONIX) 40 MG tablet Take 1 tablet (40 mg total) by mouth daily. 02/27/21   Darreld Mclean, PA-C  promethazine-dextromethorphan (PROMETHAZINE-DM) 6.25-15 MG/5ML syrup Take 5 mLs by mouth at bedtime as needed for cough. 05/08/21   Jaynee Eagles, PA-C  pseudoephedrine (SUDAFED) 60 MG tablet Take 1 tablet (60 mg  total) by mouth every 8 (eight) hours as needed for congestion. 05/08/21   Jaynee Eagles, PA-C  rosuvastatin (CRESTOR) 10 MG tablet Take 1 tablet (10 mg total) by mouth daily. 10/09/20   Nicolette Bang, MD    Current Outpatient Medications  Medication Sig Dispense Refill   benzonatate (TESSALON) 100 MG capsule Take 1-2 capsules (100-200 mg total) by mouth 3 (three) times daily as needed for cough. 60 capsule 0   cetirizine (ZYRTEC ALLERGY) 10 MG tablet Take 1 tablet (10 mg total) by mouth daily. 30 tablet 0   hydrochlorothiazide (HYDRODIURIL) 25 MG tablet Take 1 tablet (25 mg total) by mouth daily. 90 tablet 0   lisinopril (ZESTRIL) 40 MG tablet Take 1 tablet (40 mg total) by mouth daily. 90 tablet 1   cyclobenzaprine (FLEXERIL) 10 MG tablet Take 1 tablet (10 mg total) by mouth 3 (three) times daily as needed for muscle spasms. 30 tablet 0   nicotine polacrilex (NICORETTE) 4 MG gum Take 1 each (4 mg total) by mouth as needed for smoking cessation (every 2 hours). (Patient not taking: Reported on 05/30/2021) 100 tablet 0   pantoprazole (PROTONIX) 40 MG tablet Take 1 tablet (40 mg total) by mouth daily. 30 tablet 11   promethazine-dextromethorphan (PROMETHAZINE-DM) 6.25-15 MG/5ML syrup Take 5 mLs by mouth at bedtime as needed for cough. 100 mL 0   pseudoephedrine (SUDAFED) 60 MG tablet Take 1 tablet (60 mg total) by mouth every 8 (eight) hours as needed for congestion. 30 tablet 0   rosuvastatin (CRESTOR) 10 MG tablet Take 1 tablet (10 mg total) by mouth daily. 90 tablet 3   Current Facility-Administered Medications  Medication Dose Route Frequency Provider Last Rate Last Admin   0.9 %  sodium chloride infusion  500 mL Intravenous Once Terianne Thaker V, DO        Allergies as of 05/30/2021   (No Known Allergies)    Family History  Problem Relation Age of Onset   Heart disease Mother    Hypertension Mother    Diabetes Father    Hypertension Father    Colon cancer Sister         passed away from colon cancer, in her 33s   Heart attack Maternal Uncle    Prostate cancer Maternal Uncle    Diabetes Other    Pancreatic cancer Neg Hx    Rectal cancer Neg Hx    Esophageal cancer Neg Hx    Stomach cancer Neg Hx     Social History   Socioeconomic History   Marital status: Single    Spouse name: Not on file   Number of children: 2   Years of education: Not on file   Highest education level: Not on file  Occupational History   Occupation: Scientist, 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   Smoking status: Every Day    Packs/day: 0.25    Years: 10.00    Pack years: 2.50    Types: Cigarettes   Smokeless tobacco: Never  Vaping Use   Vaping Use:  Never used  Substance and Sexual Activity   Alcohol use: Yes    Alcohol/week: 0.0 standard drinks    Comment: occasional/wine on the weekends   Drug use: No   Sexual activity: Not on file  Other Topics Concern   Not on file  Social History Narrative   Right handed   Lives alone one story home   Social Determinants of Health   Financial Resource Strain: Not on file  Food Insecurity: Not on file  Transportation Needs: Not on file  Physical Activity: Not on file  Stress: Not on file  Social Connections: Not on file  Intimate Partner Violence: Not on file    Physical Exam: Vital signs in last 24 hours: @BP  134/75    Pulse 91    Temp 97.7 F (36.5 C)    Ht 5\' 10"  (1.778 m)    Wt 248 lb (112.5 kg)    SpO2 97%    BMI 35.58 kg/m  GEN: NAD EYE: Sclerae anicteric ENT: MMM CV: Non-tachycardic Pulm: CTA b/l GI: Soft, NT/ND NEURO:  Alert & Oriented x 3   Gerrit Heck, DO [facility]   05/30/2021 10:47 AM

## 2021-05-30 NOTE — Progress Notes (Signed)
1057 Robinul 0.1 mg IV given due large amount of secretions upon assessment.  MD made aware, vss

## 2021-05-30 NOTE — Progress Notes (Signed)
VS by DT    

## 2021-05-30 NOTE — Progress Notes (Signed)
Called to room to assist during endoscopic procedure.  Patient ID and intended procedure confirmed with present staff. Received instructions for my participation in the procedure from the performing physician.  

## 2021-06-03 ENCOUNTER — Telehealth: Payer: Self-pay

## 2021-06-03 NOTE — Telephone Encounter (Signed)
°  Follow up Call-  Call back number 05/30/2021  Post procedure Call Back phone  # [phone]  Permission to leave phone message Yes  Some recent data might be hidden     Patient questions:  Do you have a fever, pain , or abdominal swelling? No. Pain Score  0 *  Have you tolerated food without any problems? Yes.    Have you been able to return to your normal activities? Yes.    Do you have any questions about your discharge instructions: Diet   No. Medications  No. Follow up visit  No.  Do you have questions or concerns about your Care? No.  Actions: * If pain score is 4 or above: No action needed, pain <4.

## 2021-06-07 ENCOUNTER — Encounter: Payer: Self-pay | Admitting: Gastroenterology

## 2021-07-19 NOTE — Progress Notes (Signed)
Cardiology Office Note:    Date:  08/02/2021   ID:  [name], DOB 1971/02/08, MRN [id number]  PCP:  [name], MD  Cardiologist:  Quay Burow, MD  Electrophysiologist:  None   Referring MD: [name], MD   Chief Complaint: follow-up of atypical chest pain  History of Present Illness:    [name] is a 51 y.o. male with a history of atypical chest pain with low risk Myoview in 2018 and coronary calcium score of 0 in 05/2019, hypertension, hyperlipidemia, GERD with known esophagogastritis, hiatal hernia, and tobacco abuse who is followed by Dr. [name] and presents today for follow-up of atypical chest pain.   Patient was initially seen by Dr. Domenic Polite in 07/2016 for further evaluation of atypical chest pain that he described as left-sided sporadic mild dullness. Myoview was ordered with further evaluation and was low risk with a medium defect in the inferior region felt to mostly likely be attenuation artifact (although scar could not be ruled out) but no ischemia. He was not seen again until 05/2019 when he was referred back to Dr. [name] for follow up of ED visit for chest pain where he ruled out for MI. Coronary calcium score was ordered and was 0. Therefore, chest pain not felt to be cardiac in nature.  Patient was last seen by me in 02/2021 at which time he reported pain that radiated up from his stomach all the way to his chest and neck that occurred mainly after eating.  This pain was similar to the pain that he had had in the past.  He has a history of esophagitis and gastritis and used to follow with GI but had not established with anyone in the [place] area since moving here.  EKG showed no acute ischemic changes. Symptoms were felt to be very atypical and likely GI in nature.  Omeprazole was stopped and he was started on Protonix instead.  He was referred to GI for further evaluation.  He also reported snoring that would wake him up at night; therefore, sleep  study was ordered for evaluation of sleep apnea. He was seen by GI in 02/2021 and EGD/colonoscopy were performed in 05/2021 showed gastritis and ectopic gastric mucosa in the upper third of the esophagus as well as a couple of polyps in the ascending and sigmoid colon.  Biopsies were performed.  He did recently present to the ED on 07/21/2021 for right lower quadrant pain with associated diarrhea as well has an abscess at the base of the scrotum.  There was some induration at the base of the scrotum noted on exam so he was started on a course of antibiotics and was advised to follow-up with Urology. Otherwise work-up was unremarkable  Patient presents today for follow-up. Here alone. Patient is doing well from a cardiac standpoint. He denies any recurrent abdominal/chest pain.  He reports some dyspnea with more extreme activities but otherwise no shortness of breath.  No orthopnea, PND, lower extremity edema.  No palpitations, lightheadedness, dizziness, syncope.  He states he never received a call about scheduling the sleep study so we will follow-up on this.  He does continue to smoke and states he is now smoking 1/2 pack per day. His weight is also up 10 lbs since October. He states this is because he is no longer exercising.   Past Medical History:  Diagnosis Date   Allergy    Atypical chest pain    GERD (gastroesophageal reflux disease)  Hepatic cyst    Benign by MRI   Hiatal hernia    History of colonic polyps    Hyperlipidemia    Hypertension    Rectal bleeding    Tobacco abuse     Past Surgical History:  Procedure Laterality Date   COLONOSCOPY  01/10/2009     RMR: Normal rectum, normal colon; repeat in 2015 due to [place] of colon cancer   COLONOSCOPY N/A 01/09/2014   [id number] colonic polyps-removed as described   COLONOSCOPY N/A 09/18/2016   Procedure: COLONOSCOPY;  Surgeon: Daneil Dolin, MD;  [facility]: [facility];  Service: Endoscopy;  Laterality: N/A;  730     ESOPHAGOGASTRODUODENOSCOPY  10/04/2007   RMR: Distal esophageal erosions consistent with erosive reflux esophagitis, patulous gastroesophageal junction status post passage of a  [place] dilator, 76 [place].  Otherwise, unremarkable esophagus.  Hiatal hernia.  Otherwise normal stomach.  Bulbar erosion   ESOPHAGOGASTRODUODENOSCOPY N/A 01/09/2014   Erosive reflux esophagitis. Small hiatal hernia   I & D EXTREMITY Left 07/26/2018   Procedure: IRRIGATION AND DEBRIDEMENT EXTREMITY;  Surgeon: Dayna Barker, MD;  Location: [facility];  Service: Plastics;  Laterality: Left;   PERCUTANEOUS PINNING Left 07/26/2018   Procedure: PERCUTANEOUS PINNING EXTREMITY;  Surgeon: Dayna Barker, MD;  Location: [facility];  Service: Plastics;  Laterality: Left;    Current Medications: Current Meds  Medication Sig   benzonatate (TESSALON) 100 MG capsule Take 1-2 capsules (100-200 mg total) by mouth 3 (three) times daily as needed for cough.   cetirizine (ZYRTEC ALLERGY) 10 MG tablet Take 1 tablet (10 mg total) by mouth daily.   cyclobenzaprine (FLEXERIL) 10 MG tablet Take 1 tablet (10 mg total) by mouth 3 (three) times daily as needed for muscle spasms.   hydrochlorothiazide (HYDRODIURIL) 25 MG tablet Take 1 tablet (25 mg total) by mouth daily.   lisinopril (ZESTRIL) 40 MG tablet Take 1 tablet (40 mg total) by mouth daily.   nicotine polacrilex (NICORETTE) 4 MG gum Take 1 each (4 mg total) by mouth as needed for smoking cessation (every 2 hours).   pantoprazole (PROTONIX) 40 MG tablet Take 1 tablet (40 mg total) by mouth daily.   promethazine-dextromethorphan (PROMETHAZINE-DM) 6.25-15 MG/5ML syrup Take 5 mLs by mouth at bedtime as needed for cough.   pseudoephedrine (SUDAFED) 60 MG tablet Take 1 tablet (60 mg total) by mouth every 8 (eight) hours as needed for congestion.   rosuvastatin (CRESTOR) 10 MG tablet Take 1 tablet (10 mg total) by mouth daily.     Allergies:   Patient has no known allergies.   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   Smoking status: Every Day    Packs/day: 0.25    Years: 10.00    Pack years: 2.50    Types: Cigarettes   Smokeless tobacco: Never  Vaping Use   Vaping Use: Never used  Substance and Sexual Activity   Alcohol use: Yes    Alcohol/week: 0.0 standard drinks    Comment: occasional/wine on the weekends   Drug use: No   Sexual activity: Not on file  Other Topics Concern   Not on file  Social History  Narrative   Right handed   Lives alone one story home   Social Determinants of Health   Financial Resource Strain: Not on file  Food Insecurity: Not on file  Transportation Needs: Not on file  Physical Activity: Not on file  Stress: Not on file  Social Connections: Not on file     Family History: The patient's family history includes Colon cancer in his sister; Diabetes in his father and another family member; Heart attack in his maternal uncle; Heart disease in his mother; Hypertension in his father and mother; Prostate cancer in his maternal uncle. There is no history of Pancreatic cancer, Rectal cancer, Esophageal cancer, or Stomach cancer.  ROS:   Please see the history of present illness.     EKGs/Labs/Other Studies Reviewed:    The following studies were reviewed today:  Myoview 08/08/2016: <1 mm ST depressions with T wave inversions at rest and throughout study in leads III and aVF. Defect 1: There is a medium defect of moderate severity present in the basal inferoseptal, basal inferior, basal inferolateral, mid inferoseptal, mid inferior and mid inferolateral location. While this is likely due to soft tissue attenuation artifact, a degree of myocardial scar cannot  enitrely be ruled out. This is a low risk study. No ischemic territories. Nuclear stress EF: 61%. _______________   CT Cardiac Scoring 06/02/2019: Coronary calcium score of 0. This was 0 percentile for age and sex matched control.  EKG:  EKG not ordered today.   Recent Labs: 07/21/2021: ALT 33; BUN 14; Creatinine, Ser 1.05; Hemoglobin 14.1; Platelets 236; Potassium 4.1; Sodium 133  Recent Lipid Panel    Component Value Date/Time   CHOL 208 (H) 10/02/2020 1359   TRIG 265 (H) 10/02/2020 1359   HDL 41 10/02/2020 1359   CHOLHDL 5.1 (H) 10/02/2020 1359   CHOLHDL 8.5 06/23/2008 0810   VLDL 31 06/23/2008 0810   LDLCALC 120 (H) 10/02/2020 1359    Physical Exam:    Vital Signs: BP 130/80 (BP Location: Left Arm, Patient Position: Sitting)    Pulse 90    Ht 5\' 9"  (1.753 m)    Wt 258 lb (117 kg)    SpO2 97%    BMI 38.10 kg/m     Wt Readings from Last 3 Encounters:  08/02/21 258 lb (117 kg)  07/21/21 250 lb (113.4 kg)  05/30/21 248 lb (112.5 kg)     General: 51 y.o. obese African-American male in no acute distress. HEENT: Normocephalic and atraumatic. Sclera clear.  Neck: Supple. No carotid bruits. No JVD. Heart: RRR. Distinct S1 and S2. No murmurs, gallops, or rubs. Radial pulses 2+ and equal bilaterally. Lungs: No increased work of breathing. Clear to ausculation bilaterally. No wheezes, rhonchi, or rales.  Abdomen: Soft, non-distended, and non-tender to palpation.  MSK: Normal strength and tone for age.  Extremities: No lower extremity edema.    Skin: Warm and dry. Neuro: Alert and oriented x3. No focal deficits. Psych: Normal affect. Responds appropriately.  Assessment:    1. History of atypical chest pain   2. Gastroesophageal reflux disease with esophagitis without hemorrhage   3. Primary hypertension   4. Hyperlipidemia, unspecified hyperlipidemia type   5. Snoring   6. Obesity (BMI 30-39.9)   7. Tobacco abuse     Plan:    History of Atypical Chest Pain GERD with  History of Esophagitis and Gastritis History of atypical chest pain. Myoview in 2018 was low risk. Coronary calcium score in 05/2019 was 0. He has  history of GERD with known esophagitis and gastritis. At last visit in 02/2021, he reported recurrent atypical chest pain that was felt to be GI in nature. He was referred to GI and underwent EGD/colonoscopy which did show some gastritis and ectopic gastric mucosa in the esophagus.  - No recurrent chest pain. - No additional cardiac work-up necessary.   Hypertension BP borderline in office today. BP initially 132/88 and then was 132/80 on my personal recheck. - Continue current medications: Lisinopril 40mg  daily and HCTZ 25mg  daily.  - Advised patient to continue to monitor BP at home and notify us if consistently >130/80. - Recommended limiting sodium intake and increasing physical activity.   Hyperlipidemia Lipid panel in 09/2020: Total Cholesterol 208, Triglycerides 265, HDL 41, LDL 120.  - PCP started Crestor 10mg  daily at time of last labs. - Being managed by PCP.  Snoring Patient described snoring that wakes him up from sleep at last visit. Sleep study was ordered at last visit but has not been done yet. It looks like our office tried to call him and sent him a letter but patient said he did not receive these.  - Will reorder split night sleep study today.    Obesity BMI 38.10. He is up 10 lbs since 02/2021 which he states is because he stopped exercising. Encouraged patient to start exercising again and to aim for at least 150 minutes/week. Also discussed importance of heart healthy diet.   Tobacco Abuse Patient continues to smoke and currently smokes 1/2 pack per day. - Discussed the importance of complete cessation.  Disposition: Follow up in 1 year.   Medication Adjustments/Labs and Tests Ordered: Current medicines are reviewed at length with the patient today.  Concerns regarding medicines are outlined above.  Orders Placed This  Encounter  Procedures   Split night study   No orders of the defined types were placed in this encounter.   Patient Instructions  Medication Instructions:  Your physician recommends that you continue on your current medications as directed. Please refer to the Current Medication list given to you today.  *If you need a refill on your cardiac medications before your next appointment, please call your pharmacy*   Lab Work: None ordered  If you have labs (blood work) drawn today and your tests are completely normal, you will receive your results only by: [place] (if you have MyChart) OR A paper copy in the mail If you have any lab test that is abnormal or we need to change your treatment, we will call you to review the results.   Testing/Procedures: Your physician has recommended that you have a sleep study.  THEY WILL CALL YOU TO SCHEDULE This test records several body functions during sleep, including: brain activity, eye movement, oxygen and carbon dioxide blood levels, heart rate and rhythm, breathing rate and rhythm, the flow of air through your mouth and nose, snoring, body muscle movements, and chest and belly movement.    Follow-Up: At [facility], you and your health needs are our priority.  As part of our continuing mission to provide you with exceptional heart care, we have created designated Provider Care Teams.  These Care Teams include your primary Cardiologist (physician) and Advanced Practice Providers (APPs -  Physician Assistants and Nurse Practitioners) who all work together to provide you with the care you need, when you need it.  We recommend signing up for the patient portal called "MyChart".  Sign up information is provided on this After Visit  Summary.  MyChart is used to connect with patients for Virtual Visits (Telemedicine).  Patients are able to view lab/test results, encounter notes, upcoming appointments, etc.  Non-urgent messages can be sent to  your provider as well.   To learn more about what you can do with MyChart, go to [email or web address].    Your next appointment:   12 month(s)  The format for your next appointment:   In Person  Provider:   Sande Rives, PA-C        Other Instructions Keep a check on your blood pressure, if you notice it's consistently running over 130/80,  please let us know   Signed, Darreld Mclean, PA-C  08/02/2021 11:56 AM    [place]

## 2021-07-21 ENCOUNTER — Emergency Department (HOSPITAL_COMMUNITY): Payer: BC Managed Care – PPO

## 2021-07-21 ENCOUNTER — Emergency Department (HOSPITAL_COMMUNITY)
Admission: EM | Admit: 2021-07-21 | Discharge: 2021-07-21 | Disposition: A | Discharge status: Home / Self Care | Payer: BC Managed Care – PPO | Attending: Emergency Medicine | Admitting: Emergency Medicine

## 2021-07-21 ENCOUNTER — Other Ambulatory Visit: Payer: Self-pay

## 2021-07-21 ENCOUNTER — Encounter (HOSPITAL_COMMUNITY): Payer: Self-pay | Admitting: Emergency Medicine

## 2021-07-21 DIAGNOSIS — R197 Diarrhea, unspecified: Secondary | ICD-10-CM | POA: Insufficient documentation

## 2021-07-21 DIAGNOSIS — K7689 Other specified diseases of liver: Secondary | ICD-10-CM | POA: Diagnosis not present

## 2021-07-21 DIAGNOSIS — R1031 Right lower quadrant pain: Secondary | ICD-10-CM

## 2021-07-21 LAB — COMPREHENSIVE METABOLIC PANEL
ALT: 33 U/L (ref 0–44)
AST: 36 U/L (ref 15–41)
Albumin: 3.8 g/dL (ref 3.5–5.0)
Alkaline Phosphatase: 63 U/L (ref 38–126)
Anion gap: 9 (ref 5–15)
BUN: 14 mg/dL (ref 6–20)
CO2: 23 mmol/L (ref 22–32)
Calcium: 9.4 mg/dL (ref 8.9–10.3)
Chloride: 101 mmol/L (ref 98–111)
Creatinine, Ser: 1.05 mg/dL (ref 0.61–1.24)
GFR, Estimated: >60 mL/min (ref >60)
Glucose, Bld: 112 mg/dL — ABNORMAL HIGH (ref 70–99)
Potassium: 4.1 mmol/L (ref 3.5–5.1)
Sodium: 133 mmol/L — ABNORMAL LOW (ref 135–145)
Total Bilirubin: 0.6 mg/dL (ref 0.3–1.2)
Total Protein: 8.8 g/dL — ABNORMAL HIGH (ref 6.5–8.1)

## 2021-07-21 LAB — URINALYSIS, ROUTINE W REFLEX MICROSCOPIC
Bacteria, UA: NONE SEEN
Bilirubin Urine: NEGATIVE
Glucose, UA: NEGATIVE mg/dL
Ketones, ur: NEGATIVE mg/dL
Leukocytes,Ua: NEGATIVE
Nitrite: NEGATIVE
Protein, ur: NEGATIVE mg/dL
Specific Gravity, Urine: 1.009 (ref 1.005–1.030)
pH: 5 (ref 5.0–8.0)

## 2021-07-21 LAB — CBC
HCT: 44.3 % (ref 39.0–52.0)
Hemoglobin: 14.1 g/dL (ref 13.0–17.0)
MCH: 28.4 pg (ref 26.0–34.0)
MCHC: 31.8 g/dL (ref 30.0–36.0)
MCV: 89.1 fL (ref 80.0–100.0)
Platelets: 236 10*3/uL (ref 150–400)
RBC: 4.97 MIL/uL (ref 4.22–5.81)
RDW: 12.7 % (ref 11.5–15.5)
WBC: 8 10*3/uL (ref 4.0–10.5)
nRBC: 0 % (ref 0.0–0.2)

## 2021-07-21 LAB — LIPASE, BLOOD: Lipase: 39 U/L (ref 11–51)

## 2021-07-21 MED ORDER — SULFAMETHOXAZOLE-TRIMETHOPRIM 800-160 MG PO TABS: 1.0000 | ORAL_TABLET | Freq: Two times a day (BID) | ORAL | 0 refills | Status: AC

## 2021-07-21 MED ORDER — IOHEXOL 300 MG/ML  SOLN
100.0000 mL | Freq: Once | INTRAMUSCULAR | Status: AC | PRN
  Administered 2021-07-21: 100 mL via INTRAVENOUS

## 2021-07-21 NOTE — ED Triage Notes (Addendum)
C/o RLQ pain x 2 weeks with nausea and diarrhea.  Also reports groin abscess.

## 2021-07-21 NOTE — ED Provider Notes (Signed)
[facility] EMERGENCY DEPARTMENT Provider Note   CSN: [id number] Arrival date & time: 07/21/21  0957     History  Chief Complaint  Patient presents with   Abdominal Pain    [name] is a 51 y.o. male.  Patient presents chief complaint of right lower quadrant abdominal pain associated with diarrhea about 2 episodes a day.  Symptoms of pain going on for about 2 weeks, diarrhea for about 3 days.  Denies vomiting denies fevers.  Also states he noticed an abscess at the base of his scrotum.  He has noticed tenderness and swelling there off and on for the past 3 weeks.  He squeezed it 2 days ago with some pus that came out but he still feels a lump there and presents to the ER.      Home Medications Prior to Admission medications   Medication Sig Start Date End Date Taking? Authorizing Provider  sulfamethoxazole-trimethoprim (BACTRIM DS) 800-160 MG tablet Take 1 tablet by mouth 2 (two) times daily for 7 days. 07/21/21 07/28/21 Yes Tinaya Ceballos, Greggory Brandy, MD  benzonatate (TESSALON) 100 MG capsule Take 1-2 capsules (100-200 mg total) by mouth 3 (three) times daily as needed for cough. 05/08/21   Jaynee Eagles, PA-C  cetirizine (ZYRTEC ALLERGY) 10 MG tablet Take 1 tablet (10 mg total) by mouth daily. 05/08/21   Jaynee Eagles, PA-C  cyclobenzaprine (FLEXERIL) 10 MG tablet Take 1 tablet (10 mg total) by mouth 3 (three) times daily as needed for muscle spasms. 10/02/20   Nicolette Bang, MD  hydrochlorothiazide (HYDRODIURIL) 25 MG tablet Take 1 tablet (25 mg total) by mouth daily. 11/22/20   Camillia Herter, NP  lisinopril (ZESTRIL) 40 MG tablet Take 1 tablet (40 mg total) by mouth daily. 02/27/21   Darreld Mclean, PA-C  nicotine polacrilex (NICORETTE) 4 MG gum Take 1 each (4 mg total) by mouth as needed for smoking cessation (every 2 hours). Patient not taking: Reported on 05/30/2021 02/27/21   Darreld Mclean, PA-C  pantoprazole (PROTONIX) 40 MG tablet Take 1 tablet (40 mg  total) by mouth daily. 02/27/21   Darreld Mclean, PA-C  promethazine-dextromethorphan (PROMETHAZINE-DM) 6.25-15 MG/5ML syrup Take 5 mLs by mouth at bedtime as needed for cough. 05/08/21   Jaynee Eagles, PA-C  pseudoephedrine (SUDAFED) 60 MG tablet Take 1 tablet (60 mg total) by mouth every 8 (eight) hours as needed for congestion. 05/08/21   Jaynee Eagles, PA-C  rosuvastatin (CRESTOR) 10 MG tablet Take 1 tablet (10 mg total) by mouth daily. 10/09/20   Nicolette Bang, MD      Allergies    Patient has no known allergies.    Review of Systems   Review of Systems  Constitutional:  Negative for fever.  HENT:  Negative for ear pain and sore throat.   Eyes:  Negative for pain.  Respiratory:  Negative for cough.   Cardiovascular:  Negative for chest pain.  Gastrointestinal:  Positive for abdominal pain.  Genitourinary:  Negative for flank pain.  Musculoskeletal:  Negative for back pain.  Skin:  Negative for color change and rash.  Neurological:  Negative for syncope.  All other systems reviewed and are negative.  Physical Exam Updated Vital Signs BP (!) 140/92    Pulse 81    Temp 98.7 F (37.1 C) (Oral)    Resp 19    Ht 5\' 10"  (1.778 m)    Wt 113.4 kg    SpO2 96%    BMI  35.87 kg/m  Physical Exam Constitutional:      Appearance: He is well-developed.  HENT:     Head: Normocephalic.     Nose: Nose normal.  Eyes:     Extraocular Movements: Extraocular movements intact.  Cardiovascular:     Rate and Rhythm: Normal rate.  Pulmonary:     Effort: Pulmonary effort is normal.  Abdominal:     Tenderness: There is abdominal tenderness in the right lower quadrant. There is no guarding or rebound.  Skin:    Coloration: Skin is not jaundiced.  Neurological:     Mental Status: He is alert. Mental status is at baseline.    ED Results / Procedures / Treatments   Labs (all labs ordered are listed, but only abnormal results are displayed) Labs Reviewed  COMPREHENSIVE METABOLIC PANEL  - Abnormal; Notable for the following components:      Result Value   Sodium 133 (*)    Glucose, Bld 112 (*)    Total Protein 8.8 (*)    All other components within normal limits  URINALYSIS, ROUTINE W REFLEX MICROSCOPIC - Abnormal; Notable for the following components:   Color, Urine STRAW (*)    Hgb urine dipstick SMALL (*)    All other components within normal limits  LIPASE, BLOOD  CBC    EKG None  Radiology CT Abdomen Pelvis W Contrast  Result Date: 07/21/2021 CLINICAL DATA:  Right lower quadrant pain, nausea, and diarrhea for 2 weeks. EXAM: CT ABDOMEN AND PELVIS WITH CONTRAST TECHNIQUE: Multidetector CT imaging of the abdomen and pelvis was performed using the standard protocol following bolus administration of intravenous contrast. RADIATION DOSE REDUCTION: This exam was performed according to the departmental dose-optimization program which includes automated exposure control, adjustment of the mA and/or kV according to patient size and/or use of iterative reconstruction technique. CONTRAST:  150mL OMNIPAQUE IOHEXOL 300 MG/ML  SOLN COMPARISON:  None. FINDINGS: Lower Chest: No acute findings. Hepatobiliary: No hepatic masses identified. Small cyst noted in segment 2 of the left lobe. Gallbladder is unremarkable. No evidence of biliary ductal dilatation. Pancreas:  No mass or inflammatory changes. Spleen: Within normal limits in size and appearance. Adrenals/Urinary Tract: No masses identified. No evidence of ureteral calculi or hydronephrosis. Stomach/Bowel: No evidence of obstruction, inflammatory process or abnormal fluid collections. Normal appendix visualized. Vascular/Lymphatic: No pathologically enlarged lymph nodes. No acute vascular findings. Reproductive:  No mass or other significant abnormality. Other:  None. Musculoskeletal:  No suspicious bone lesions identified. IMPRESSION: Negative. No evidence of appendicitis or other acute findings. Electronically Signed   By: Marlaine Hind  M.D.   On: 07/21/2021 12:28    Procedures Procedures    Medications Ordered in ED Medications  iohexol (OMNIPAQUE) 300 MG/ML solution 100 mL (100 mLs Intravenous Contrast Given 07/21/21 1220)    ED Course/ Medical Decision Making/ A&P                           Medical Decision Making Amount and/or Complexity of Data Reviewed Labs: ordered. Radiology: ordered.  Risk Prescription drug management.   Patient placed on the monitor with sinus rhythm, rate of 81 bpm,.   Chart review shows telephone call for follow-up with primary care doctor January for 2023.  Work-up included labs which showed a normal white count of 18 chemistry unremarkable lipase normal.  Urinalysis normal.  CT abdomen pelvis pursued with no acute findings per radiology.  No large abscess noted on imaging scan.  On evaluation of his scrotum, there is some induration at the base of his scrotum but no fluctuance nothing that requires incision and drainage at this time.  It appears that the patient squeezed out most of any fluctuance that may have been present earlier.  He states that these symptoms have been there for about 3 weeks waxing and waning.  Recommending a course of antibiotics and outpatient follow-up with urology.  Phone number provided for the patient to call.        Final Clinical Impression(s) / ED Diagnoses Final diagnoses:  Right lower quadrant abdominal pain    Rx / DC Orders ED Discharge Orders          Ordered    sulfamethoxazole-trimethoprim (BACTRIM DS) 800-160 MG tablet  2 times daily        07/21/21 1310              [place], Greggory Brandy, MD 07/21/21 1310

## 2021-07-21 NOTE — ED Notes (Signed)
Pt returned from CT °

## 2021-07-21 NOTE — Discharge Instructions (Addendum)
Call your primary care doctor or specialist as discussed in the next 2-3 days.   Return immediately back to the ER if:  Your symptoms worsen within the next 12-24 hours. You develop new symptoms such as new fevers, persistent vomiting, new pain, shortness of breath, or new weakness or numbness, or if you have any other concerns.  

## 2021-07-21 NOTE — ED Notes (Signed)
Pt to CT

## 2021-08-02 ENCOUNTER — Ambulatory Visit (INDEPENDENT_AMBULATORY_CARE_PROVIDER_SITE_OTHER): Payer: BC Managed Care – PPO | Admitting: Student

## 2021-08-02 ENCOUNTER — Other Ambulatory Visit: Payer: Self-pay

## 2021-08-02 ENCOUNTER — Encounter: Payer: Self-pay | Admitting: Student

## 2021-08-02 ENCOUNTER — Encounter: Payer: Self-pay | Admitting: *Deleted

## 2021-08-02 VITALS — BP 130/80 | HR 90 | Ht 69.0 in | Wt 258.0 lb

## 2021-08-02 DIAGNOSIS — K2100 Gastro-esophageal reflux disease with esophagitis, without bleeding: Secondary | ICD-10-CM

## 2021-08-02 DIAGNOSIS — Z720 Tobacco use: Secondary | ICD-10-CM

## 2021-08-02 DIAGNOSIS — I10 Essential (primary) hypertension: Secondary | ICD-10-CM

## 2021-08-02 DIAGNOSIS — E785 Hyperlipidemia, unspecified: Secondary | ICD-10-CM | POA: Diagnosis not present

## 2021-08-02 DIAGNOSIS — E669 Obesity, unspecified: Secondary | ICD-10-CM

## 2021-08-02 DIAGNOSIS — R0683 Snoring: Secondary | ICD-10-CM

## 2021-08-02 DIAGNOSIS — R0789 Other chest pain: Secondary | ICD-10-CM | POA: Diagnosis not present

## 2021-08-02 NOTE — Patient Instructions (Addendum)
Medication Instructions:  ?Your physician recommends that you continue on your current medications as directed. Please refer to the Current Medication list given to you today. ? ?*If you need a refill on your cardiac medications before your next appointment, please call your pharmacy* ? ? ?Lab Work: ?None ordered ? ?If you have labs (blood work) drawn today and your tests are completely normal, you will receive your results only by: ?MyChart Message (if you have MyChart) OR ?A paper copy in the mail ?If you have any lab test that is abnormal or we need to change your treatment, we will call you to review the results. ? ? ?Testing/Procedures: ?Your physician has recommended that you have a sleep study.  THEY WILL CALL YOU TO SCHEDULE This test records several body functions during sleep, including: brain activity, eye movement, oxygen and carbon dioxide blood levels, heart rate and rhythm, breathing rate and rhythm, the flow of air through your mouth and nose, snoring, body muscle movements, and chest and belly movement. ? ? ? ?Follow-Up: ?At [facility], you and your health needs are our priority.  As part of our continuing mission to provide you with exceptional heart care, we have created designated Provider Care Teams.  These Care Teams include your primary Cardiologist (physician) and Advanced Practice Providers (APPs -  Physician Assistants and Nurse Practitioners) who all work together to provide you with the care you need, when you need it. ? ?We recommend signing up for the patient portal called "MyChart".  Sign up information is provided on this After Visit Summary.  MyChart is used to connect with patients for Virtual Visits (Telemedicine).  Patients are able to view lab/test results, encounter notes, upcoming appointments, etc.  Non-urgent messages can be sent to your provider as well.   ?To learn more about what you can do with MyChart, go to [email or web address].   ? ?Your next appointment:   ?12  month(s) ? ?The format for your next appointment:   ?In Person ? ?Provider:   ?Sande Rives, PA-C      ? ? ?Other Instructions ?Keep a check on your blood pressure, if you notice it's consistently running over 130/80,  please let us know ?

## 2021-08-05 ENCOUNTER — Other Ambulatory Visit: Payer: Self-pay | Admitting: Family

## 2021-08-05 ENCOUNTER — Other Ambulatory Visit: Payer: Self-pay | Admitting: Internal Medicine

## 2021-08-05 DIAGNOSIS — K219 Gastro-esophageal reflux disease without esophagitis: Secondary | ICD-10-CM

## 2021-08-05 DIAGNOSIS — I10 Essential (primary) hypertension: Secondary | ICD-10-CM

## 2021-08-09 ENCOUNTER — Telehealth: Payer: Self-pay | Admitting: *Deleted

## 2021-08-09 ENCOUNTER — Other Ambulatory Visit: Payer: Self-pay | Admitting: Student

## 2021-08-09 DIAGNOSIS — I10 Essential (primary) hypertension: Secondary | ICD-10-CM

## 2021-08-09 DIAGNOSIS — R0683 Snoring: Secondary | ICD-10-CM

## 2021-08-09 NOTE — Telephone Encounter (Signed)
Patient notified of HST appointment details. °

## 2021-08-20 ENCOUNTER — Encounter: Payer: Self-pay | Admitting: Gastroenterology

## 2021-08-20 ENCOUNTER — Other Ambulatory Visit: Payer: Self-pay

## 2021-08-20 ENCOUNTER — Ambulatory Visit (INDEPENDENT_AMBULATORY_CARE_PROVIDER_SITE_OTHER): Payer: BC Managed Care – PPO | Admitting: Gastroenterology

## 2021-08-20 VITALS — BP 142/90 | HR 95 | Ht 69.0 in | Wt 256.5 lb

## 2021-08-20 DIAGNOSIS — K439 Ventral hernia without obstruction or gangrene: Secondary | ICD-10-CM | POA: Diagnosis not present

## 2021-08-20 DIAGNOSIS — K219 Gastro-esophageal reflux disease without esophagitis: Secondary | ICD-10-CM

## 2021-08-20 DIAGNOSIS — Z86010 Personal history of colonic polyps: Secondary | ICD-10-CM

## 2021-08-20 DIAGNOSIS — R1011 Right upper quadrant pain: Secondary | ICD-10-CM

## 2021-08-20 DIAGNOSIS — R140 Abdominal distension (gaseous): Secondary | ICD-10-CM | POA: Diagnosis not present

## 2021-08-20 NOTE — Progress Notes (Signed)
? ?Chief Complaint:    Abdominal pain, bloating ? ?GI History: 51 year old male with a history of HTN, HLD, follows in the GI clinic for the following: ? ?1) GERD: Longstanding history of reflux symptoms for 20+ years. Previously treated with Dexilant and omeprazole, but eventually titrated off of these.  Symptoms started to recur in 2022, described as heartburn, substernal chest pain, regurgitation. Re-started Protonix 40 mg/day in 02/2021.  Does have intermittent solid food dysphagia; resolved with EGD with dilation in 05/2021 as below.  ? ?2) RUQ pain, abdominal bloating.  Symptoms started about 11/2020.  Seemingly occurs at random. ? ?Family history notable for sister with colon cancer, deceased in her 49s. ?  ?Evaluation to date: ?-05/2016: MRI abdomen: 1.2 cm liver cyst with lobulated contour and thin internal septation.  Recommended repeat MRI in 12 months. ?- 09/2020: Normal CMP ?- 02/2021: Initial appointment in GI clinic.  Exam with mild generalized TTP.  Recommended MRI liver; not yet done ?- 05/08/2021: ER evaluation for upper abdominal pain.  RUQ [place]: Normal liver.  1.7 cm cyst in the inferior left hepatic lobe.  Normal CBC.  AST/ALT 46/41, normal ALP, T. bili, lipase ?- 05/30/2021: EGD/colonoscopy as below ?- 07/21/2021: ER evaluation for RLQ pain, nausea/diarrhea.  CT A/P: Small hepatic cyst, otherwise normal liver.  No duct dilation.  Normal GI tract.  Normal CBC, liver enzymes, lipase.  Treated with Bactrim for possible scrotal draining abscess with urology referral ? ?Endoscopic History: ?- EGD (12/2013): Erosive esophagitis, small hiatal hernia ?- Colonoscopy (12/2013): 1 cm polyp (tubular adenoma) in descending colon, diminutive polyp (tubular adenoma) at splenic flexure ?- Colonoscopy (08/2016): Poor prep ?- EGD (05/30/2021): Benign gastric inlet patch, otherwise normal esophagus.  Empiric dilation with [place].  Biopsies negative for EOE.  Irregular Z-line (path: Benign), Mild non-H. pylori  gastritis.  Normal duodenum (path benign) ?- Colonoscopy (05/2021): 2 ascending colon polyps (TA x2), 2 sigmoid polyps (TA x1, HP x1).  Normal TI.  Repeat in 5 years ? ? ?HPI:   ? ? ?Patient is a 51 y.o. male presenting to the Gastroenterology Clinic for follow-up.  Initially seen by me on 03/11/2021 for evaluation of GERD and RUQ pain and abdominal bloating as outlined above.  Since then, has been seen in the ER x2, completed EGD/colonoscopy as outlined above. ? ?Today, he states he has intermittent right sided abdominal pain. Described as a cramp, mostly in RUQ but then sees distesion of LUQ during some of these episodes. Tends to occur with activity/exercise, but can also occur at rest at times.  Does not tend to be associated with p.o. intake.  Has been trying to eat healthy. ? ?Still with abdominal bloating, but has noticed some improvement with Gas-X prn and since starting probiotic.  Can still have postprandial abdominal distention/bloating. ? ?Reflux symptoms controlled with Protonix 40 mg/daily. ? ?Review of systems:     No chest pain, no SOB, no fevers, no urinary sx  ? ?Past Medical History:  ?Diagnosis Date  ? Allergy   ? Atypical chest pain   ? GERD (gastroesophageal reflux disease)   ? Hepatic cyst   ? Benign by MRI  ? Hiatal hernia   ? History of colonic polyps   ? Hyperlipidemia   ? Hypertension   ? Rectal bleeding   ? Tobacco abuse   ? ? ?Patient's surgical history, family medical history, social history, medications and allergies were all reviewed in Epic  ? ? ?Current Outpatient Medications  ?Medication  Sig Dispense Refill  ? benzonatate (TESSALON) 100 MG capsule Take 1-2 capsules (100-200 mg total) by mouth 3 (three) times daily as needed for cough. 60 capsule 0  ? cetirizine (ZYRTEC ALLERGY) 10 MG tablet Take 1 tablet (10 mg total) by mouth daily. 30 tablet 0  ? cyclobenzaprine (FLEXERIL) 10 MG tablet Take 1 tablet (10 mg total) by mouth 3 (three) times daily as needed for muscle spasms. 30  tablet 0  ? hydrochlorothiazide (HYDRODIURIL) 25 MG tablet Take 1 tablet by mouth once daily 90 tablet 0  ? lisinopril (ZESTRIL) 40 MG tablet Take 1 tablet (40 mg total) by mouth daily. 90 tablet 1  ? nicotine polacrilex (NICORETTE) 4 MG gum Take 1 each (4 mg total) by mouth as needed for smoking cessation (every 2 hours). 100 tablet 0  ? pantoprazole (PROTONIX) 40 MG tablet Take 1 tablet (40 mg total) by mouth daily. 30 tablet 11  ? promethazine-dextromethorphan (PROMETHAZINE-DM) 6.25-15 MG/5ML syrup Take 5 mLs by mouth at bedtime as needed for cough. 100 mL 0  ? pseudoephedrine (SUDAFED) 60 MG tablet Take 1 tablet (60 mg total) by mouth every 8 (eight) hours as needed for congestion. 30 tablet 0  ? rosuvastatin (CRESTOR) 10 MG tablet Take 1 tablet (10 mg total) by mouth daily. 90 tablet 3  ? ?No current facility-administered medications for this visit.  ? ? ?Physical Exam:   ? ? ?There were no vitals taken for this visit. ? ?GENERAL:  Pleasant male in NAD ?PSYCH: : Cooperative, normal affect ?EENT:  conjunctiva pink, mucous membranes moist, neck supple without masses ?CARDIAC:  RRR, no murmur heard, no peripheral edema ?PULM: Normal respiratory effort, lungs CTA bilaterally, no wheezing ?ABDOMEN: Mild TTP in R>L flank.  Negative Carnett's sign.  Negative Murphy sign.  No rebound or guarding.  Small ventral hernia.  Nondistended, soft,  normal bowel sounds ?SKIN:  turgor, no lesions seen ?Musculoskeletal:  Normal muscle tone, normal strength ?NEURO: Alert and oriented x 3, no focal neurologic deficits ? ? ?IMPRESSION and PLAN:   ? ?1) Abdominal bloating ?- Trial low FODMAP diet.  Provided with handout and detailed instruction today ?- Continue Gas-X prn ? ?2) RUQ pain ?- Recent CT and RUQ [place] essentially unrevealing along with normal CBC and liver enzymes in ER x2.  Discussed DDx, to include GI and non-GI etiologies.  Higher suspicion for MSK pain, but negative Carnett's sign. ?- HIDA scan ?- If HIDA negative and  symptoms persist despite low FODMAP diet and other interventions, focus more on MSK treatment ? ?3) Ventral hernia ?- Small ventral hernia on exam.  Do not recommend referral for surgical intervention ? ?4) GERD ?5) Dysphagia-resolved ?- Continue Protonix ?- Continue antireflux lifestyle/dietary modifications ? ?6) History of colon polyps ?- Repeat colonoscopy in 2028 for ongoing polyp surveillance ? ? ?    ?    ? ?Lavena Bullion ,DO, FACG 08/20/2021, 3:11 PM ? ?

## 2021-08-20 NOTE — Patient Instructions (Signed)
If you are age 53 or older, your body mass index should be between 23-30. Your Body mass index is 37.88 kg/m?[name] If this is out of the aforementioned range listed, please consider follow up with your Primary Care Provider. ? ?If you are age 83 or younger, your body mass index should be between 19-25. Your Body mass index is 37.88 kg/m?[name] If this is out of the aformentioned range listed, please consider follow up with your Primary Care Provider.  ? ?________________________________________________________ ? ?The [facility] providers would like to encourage you to use [facility] to communicate with providers for non-urgent requests or questions.  Due to long hold times on the telephone, sending your provider a message by [facility] may be a faster and more efficient way to get a response.  Please allow 48 business hours for a response.  Please remember that this is for non-urgent requests.  ?_______________________________________________________ ? ?You have been scheduled for a HIDA scan at [facility] (1st floor) on  Friday  09-06-2021   . Please arrive 15-30 minutes prior to your scheduled appointment at  7:32KG. Make certain not to have anything to eat or drink at least 6 hours prior to your test. Should this appointment date or time not work well for you, please call radiology scheduling at [phone].  ?_____________________________________________________________________ ?hepatobiliary (HIDA) scan is an imaging procedure used to diagnose problems in the liver, gallbladder and bile ducts. In the HIDA scan, a radioactive chemical or tracer is injected into a vein in your arm. The tracer is handled by the liver like bile. Bile is a fluid produced and excreted by your liver that helps your digestive system break down fats in the foods you eat. Bile is stored in your gallbladder and the gallbladder releases the bile when you eat a meal. A special nuclear medicine scanner (gamma camera) tracks the flow of the  tracer from your liver into your gallbladder and small intestine.  ?During your HIDA scan  ?You'll be asked to change into a hospital gown before your HIDA scan begins. Your health care team will position you on a table, usually on your back. The radioactive tracer is then injected into a vein in your arm.The tracer travels through your bloodstream to your liver, where it's taken up by the bile-producing cells. The radioactive tracer travels with the bile from your liver into your gallbladder and through your bile ducts to your small intestine.You may feel some pressure while the radioactive tracer is injected into your vein. As you lie on the table, a special gamma camera is positioned over your abdomen taking pictures of the tracer as it moves through your body. The gamma camera takes pictures continually for about an hour. You'll need to keep still during the HIDA scan. This can become uncomfortable, but you may find that you can lessen the discomfort by taking deep breaths and thinking about other things. Tell your health care team if you're uncomfortable. The radiologist will watch on a computer the progress of the radioactive tracer through your body. The HIDA scan may be stopped when the radioactive tracer is seen in the gallbladder and enters your small intestine. This typically takes about an hour. In some cases extra imaging will be performed if original images aren't satisfactory, if morphine is given to help visualize the gallbladder or if the medication CCK is given to look at the contraction of the gallbladder. This test typically takes 2 hours to complete. ?________________________________________________________________________ ? ?Low-FODMAP Eating Plan ?FODMAP stands  for fermentable oligosaccharides, disaccharides, monosaccharides, and polyols. These are sugars that are hard for some people to digest. A low-FODMAP eating plan may help some people who have irritable bowel syndrome (IBS) and certain  other bowel (intestinal) diseases to manage their symptoms. ?This meal plan can be complicated to follow. Work with a diet and nutrition specialist (dietitian) to make a low-FODMAP eating plan that is right for you. A dietitian can help make sure that you get enough nutrition from this diet. ?What are tips for following this plan? ?Reading food labels ?Check labels for hidden FODMAPs such as: ?High-fructose syrup. ?Honey. ?Agave. ?Natural fruit flavors. ?Onion or garlic powder. ?Choose low-FODMAP foods that contain 3-4 grams of fiber per serving. ?Check food labels for serving sizes. Eat only one serving at a time to make sure FODMAP levels stay low. ?Shopping ?Shop with a list of foods that are recommended on this diet and make a meal plan. ?Meal planning ?Follow a low-FODMAP eating plan for up to 6 weeks, or as told by your health care provider or dietitian. ?To follow the eating plan: ?Eliminate high-FODMAP foods from your diet completely. Choose only low-FODMAP foods to eat. You will do this for 2-6 weeks. ?Gradually reintroduce high-FODMAP foods into your diet one at a time. Most people should wait a few days before introducing the next new high-FODMAP food into their meal plan. Your dietitian can recommend how quickly you may reintroduce foods. ?Keep a daily record of what and how much you eat and drink. Make note of any symptoms that you have after eating. ?Review your daily record with a dietitian regularly to identify which foods you can eat and which foods you should avoid. ?General tips ?Drink enough fluid each day to keep your urine pale yellow. ?Avoid processed foods. These often have added sugar and may be high in FODMAPs. ?Avoid most dairy products, whole grains, and sweeteners. ?Work with a dietitian to make sure you get enough fiber in your diet. ?Avoid high FODMAP foods at meals to manage symptoms. ?Recommended foods ?Fruits ?Bananas, oranges, tangerines, lemons, limes, blueberries, raspberries,  strawberries, grapes, cantaloupe, honeydew melon, kiwi, papaya, passion fruit, and pineapple. Limited amounts of dried cranberries, banana chips, and shredded coconut. ?Vegetables ?Eggplant, zucchini, cucumber, peppers, green beans, bean sprouts, lettuce, arugula, kale, Swiss chard, spinach, collard greens, bok choy, summer squash, potato, and tomato. Limited amounts of corn, carrot, and sweet potato. Green parts of scallions. ?Grains ?Gluten-free grains, such as rice, oats, buckwheat, quinoa, corn, polenta, and millet. Gluten-free pasta, bread, or cereal. Rice noodles. Corn tortillas. ?Meats and other proteins ?Unseasoned beef, pork, poultry, or fish. Eggs. Berniece Salines. Tofu (firm) and tempeh. Limited amounts of nuts and seeds, such as almonds, walnuts, [place] nuts, pecans, peanuts, nut butters, pumpkin seeds, chia seeds, and sunflower seeds. ?Dairy ?Lactose-free milk, yogurt, and kefir. Lactose-free cottage cheese and ice cream. Non-dairy milks, such as almond, coconut, hemp, and rice milk. Non-dairy yogurt. Limited amounts of goat cheese, brie, mozzarella, parmesan, swiss, and other hard cheeses. ?Fats and oils ?Butter-free spreads. Vegetable oils, such as olive, canola, and sunflower oil. ?Seasoning and other foods ?Artificial sweeteners with names that do not end in "ol," such as aspartame, saccharine, and stevia. Maple syrup, white table sugar, raw sugar, brown sugar, and molasses. Mayonnaise, soy sauce, and tamari. Fresh basil, coriander, parsley, rosemary, and thyme. ?Beverages ?Water and mineral water. Sugar-sweetened soft drinks. Small amounts of orange juice or cranberry juice. Black and green tea. Most dry wines. Coffee. ?The items listed above  may not be a complete list of foods and beverages you can eat. Contact a dietitian for more information. ?Foods to avoid ?Fruits ?Fresh, dried, and juiced forms of apple, pear, watermelon, peach, plum, cherries, apricots, blackberries, boysenberries, figs, nectarines,  and mango. Avocado. ?Vegetables ?Chicory root, artichoke, asparagus, cabbage, snow peas, Brussels sprouts, broccoli, sugar snap peas, mushrooms, celery, and cauliflower. Onions, garlic, leeks, and the white p

## 2021-09-05 ENCOUNTER — Other Ambulatory Visit: Payer: Self-pay | Admitting: Family

## 2021-09-05 DIAGNOSIS — K219 Gastro-esophageal reflux disease without esophagitis: Secondary | ICD-10-CM

## 2021-09-06 ENCOUNTER — Ambulatory Visit (HOSPITAL_COMMUNITY)
Admission: RE | Admit: 2021-09-06 | Discharge: 2021-09-06 | Disposition: A | Discharge status: Home / Self Care | Payer: BC Managed Care – PPO | Source: Ambulatory Visit | Attending: Gastroenterology | Admitting: Gastroenterology

## 2021-09-06 DIAGNOSIS — R1011 Right upper quadrant pain: Secondary | ICD-10-CM

## 2021-09-06 DIAGNOSIS — R140 Abdominal distension (gaseous): Secondary | ICD-10-CM | POA: Insufficient documentation

## 2021-09-06 DIAGNOSIS — R110 Nausea: Secondary | ICD-10-CM | POA: Diagnosis not present

## 2021-09-06 MED ORDER — TECHNETIUM TC 99M MEBROFENIN IV KIT: 5.5000 | PACK | Freq: Once | INTRAVENOUS | Status: DC | PRN

## 2021-09-10 ENCOUNTER — Encounter: Payer: Self-pay | Admitting: Gastroenterology

## 2021-09-20 ENCOUNTER — Encounter (HOSPITAL_BASED_OUTPATIENT_CLINIC_OR_DEPARTMENT_OTHER): Payer: BC Managed Care – PPO | Admitting: Cardiovascular Disease

## 2021-09-23 ENCOUNTER — Encounter (HOSPITAL_BASED_OUTPATIENT_CLINIC_OR_DEPARTMENT_OTHER): Payer: Self-pay | Admitting: *Deleted

## 2021-09-23 ENCOUNTER — Ambulatory Visit (HOSPITAL_BASED_OUTPATIENT_CLINIC_OR_DEPARTMENT_OTHER): Payer: BC Managed Care – PPO | Attending: Student | Admitting: Cardiovascular Disease

## 2021-09-23 DIAGNOSIS — I10 Essential (primary) hypertension: Secondary | ICD-10-CM | POA: Diagnosis not present

## 2021-09-23 DIAGNOSIS — R0683 Snoring: Secondary | ICD-10-CM

## 2021-09-23 DIAGNOSIS — G4736 Sleep related hypoventilation in conditions classified elsewhere: Secondary | ICD-10-CM | POA: Diagnosis not present

## 2021-09-23 DIAGNOSIS — G4733 Obstructive sleep apnea (adult) (pediatric): Secondary | ICD-10-CM | POA: Diagnosis not present

## 2021-10-04 ENCOUNTER — Encounter (HOSPITAL_BASED_OUTPATIENT_CLINIC_OR_DEPARTMENT_OTHER): Payer: Self-pay | Admitting: Cardiovascular Disease

## 2021-10-04 NOTE — Procedures (Signed)
? ? ? ?  Patient Name: [name], [name] ?Study Date: 09/23/2021 ?Gender: Male ?D.O.B: January 25, 1971 ?Age (years): 82 ?Referring Provider: Sande Rives PA-C ?Height (inches): 69 ?Interpreting Physician: Shelva Majestic MD, ABSM ?Weight (lbs): 245 ?RPSGT: Jacolyn Reedy ?BMI: 36 ?MRN: [id number] ?Neck Size: 17.00 ? ?CLINICAL INFORMATION ?Sleep Study Type: HST ? ?Indication for sleep study: Snoring, obesity, hypertension ?Epworth Sleepiness Score: 1 ? ?SLEEP STUDY TECHNIQUE ?A multi-channel overnight portable sleep study was performed. The channels recorded were: nasal airflow, thoracic respiratory movement, and oxygen saturation with a pulse oximetry. Snoring was also monitored. ? ?MEDICATIONS ?cetirizine (ZYRTEC ALLERGY) 10 MG tablet ?hydrochlorothiazide (HYDRODIURIL) 25 MG tablet ?lisinopril (ZESTRIL) 40 MG tablet ?pantoprazole (PROTONIX) 40 MG tablet ?rosuvastatin (CRESTOR) 10 MG tablet ?Patient self administered medications include: N/A. ? ?SLEEP ARCHITECTURE ?Patient was studied for 337.6 minutes. The sleep efficiency was 100.0 % and the patient was supine for 52.7%. The arousal index was 0.0 per hour. ? ?RESPIRATORY PARAMETERS ?The overall AHI was 46.0 per hour, with a central apnea index of 0 per hour. Events weremore severe with supine sleep (AHI55.3/h). ? ?The oxygen nadir was 83% during sleep. Time spent < 89% was 5.6 minutes. ? ?CARDIAC DATA ?Mean heart rate during sleep was 80.5 bpm. HR range: 69 -108 bpm. ? ?IMPRESSIONS ?- Severe obstructive sleep apnea occurred during this study (AHI  46.0/h). ?- Moderate oxygen desaturation to a nadir of 83%. ?- Patient snored for 195.8 minutes (58%) during the sleep. ? ?DIAGNOSIS ?- Obstructive Sleep Apnea (G47.33) ?- Nocturnal Hypoxemia (G47.36) ?- Snoring ? ?RECOMMENDATIONS ?- In this patient with cardiovascular co-morbidities recommend therapeutic CPAP for treatment of his sleep disordered breathing. If unable to schedule an in-lab titration, initiate Auto - PAP  with EPR of 3 at 7 - 18 cm of water. ?- Effort should be made to optimize nasal and oropharyngeal patency. ?- Positional therapy avoiding supine position during sleep. ?- Avoid alcohol, sedatives and other CNS depressants that may worsen sleep apnea and disrupt normal sleep architecture. ?- Sleep hygiene should be reviewed to assess factors that may improve sleep quality. ?- Weight management (BMI 36) and regular exercise should be initiated or continued. ?- Recommend a download and sleep clinic evaluation after one month of therapy.  ? ? ?[Electronically signed] 10/04/2021 09:29 AM ? ?Shelva Majestic MD, [facility], ABSM ?Diplomate, Tax adviser of Sleep Medicine ? ?NPI: [id number] ? ? ?[place] ?PH: [phone]   FX: [phone] ?ACCREDITED BY THE [employer] SLE[employer]IC[employer] ? ?

## 2021-10-11 ENCOUNTER — Other Ambulatory Visit: Payer: Self-pay | Admitting: Family Medicine

## 2021-10-11 DIAGNOSIS — I10 Essential (primary) hypertension: Secondary | ICD-10-CM

## 2021-10-14 ENCOUNTER — Other Ambulatory Visit: Payer: Self-pay | Admitting: Cardiovascular Disease

## 2021-10-14 ENCOUNTER — Telehealth: Payer: Self-pay | Admitting: *Deleted

## 2021-10-14 DIAGNOSIS — G4733 Obstructive sleep apnea (adult) (pediatric): Secondary | ICD-10-CM

## 2021-10-14 NOTE — Telephone Encounter (Signed)
-----   Message from Troy Sine, MD sent at 10/04/2021  9:36 AM EDT ----- Mariann Laster, please notify pt of results and set up for CPAP titration or Auto-PAP

## 2021-10-14 NOTE — Telephone Encounter (Signed)
Patient notified of HST results and recommendations. He agrees to proceed with MD recommendations.

## 2021-10-18 ENCOUNTER — Telehealth: Payer: Self-pay | Admitting: *Deleted

## 2021-10-18 NOTE — Telephone Encounter (Signed)
[employer] denied CPAP titration. APAP order sent to [facility].

## 2021-10-26 ENCOUNTER — Other Ambulatory Visit: Payer: Self-pay | Admitting: Family Medicine

## 2021-10-26 DIAGNOSIS — I10 Essential (primary) hypertension: Secondary | ICD-10-CM

## 2021-10-28 ENCOUNTER — Other Ambulatory Visit: Payer: Self-pay | Admitting: Family Medicine

## 2021-10-28 ENCOUNTER — Telehealth: Payer: Self-pay | Admitting: Student

## 2021-10-28 ENCOUNTER — Other Ambulatory Visit: Payer: Self-pay

## 2021-10-28 DIAGNOSIS — I10 Essential (primary) hypertension: Secondary | ICD-10-CM

## 2021-10-28 NOTE — Progress Notes (Unsigned)
Cardiology Office Note:    Date:  10/30/2021   ID:  [name], DOB Jul 05, 1970, MRN [id number]  PCP:  [name], MD  Cardiologist:  Quay Burow, MD  Electrophysiologist:  None   Referring MD: [name], MD   Chief Complaint: follow-up of atypical chest pain  History of Present Illness:    [name] is a 51 y.o. male with a history of atypical chest pain with low risk Myoview in 2018 and coronary calcium score of 0 in 05/2019, hypertension, hyperlipidemia, GERD with known esophagogastritis, hiatal hernia, and tobacco abuse who is followed by Dr. [name] and presents today for follow-up of atypical chest pain.   Patient was initially seen by Dr. Domenic Polite in 07/2016 for further evaluation of atypical chest pain that he described as left-sided sporadic mild dullness. Myoview was ordered with further evaluation and was low risk with a medium defect in the inferior region felt to mostly likely be attenuation artifact (although scar could not be ruled out) but no ischemia. He was not seen again until 05/2019 when he was referred back to Dr. [name] for follow up of ED visit for chest pain where he ruled out for MI. Coronary calcium score was ordered and was 0. Therefore, chest pain not felt to be cardiac in nature. At visit in 02/2021, he reported  pain that radiated up from his stomach all the way to his chest and neck that occurred mainly after eating.  This pain was similar to the pain that he had had in the past.  He has a history of esophagitis and gastritis and used to follow with GI but had not established with anyone in the [place] area since moving here. His Omeprazole was switched to Protonix and he was referred to GI. He had an EGD/colonoscopy were performed in 05/2021 showed gastritis and ectopic gastric mucosa in the upper third of the esophagus as well as a couple of polyps in the ascending and sigmoid colon.  He was last seen by me in 07/2021 at which time he was doing well  from a cardiac standpoint with no recurrent abdominal/chest pain.  Patient presents today for follow-up. Patient is doing well since last visit. He reports occasional chest discomfort similar to his prior reflux symptoms but this occurs rarely. No exertional chest pain. No shortness of breath, orthopnea, PND, lower extremity edema. He reports occasional mild lightheadedness/dizziness with quick position changes but no palpitations. He continues to smoke but is cutting back - he is down to about 4 cigarettes per day. His weight is down about 8 lbs since last office visit in 07/2021. He is trying to walk more outside of work and is eating healthy (a lot of baked chicken and vegetables).   Past Medical History:  Diagnosis Date   Allergy    Atypical chest pain    GERD (gastroesophageal reflux disease)    Hepatic cyst    Benign by MRI   Hiatal hernia    History of colonic polyps    Hyperlipidemia    Hypertension    Rectal bleeding    Tobacco abuse     Past Surgical History:  Procedure Laterality Date   COLONOSCOPY  01/10/2009     RMR: Normal rectum, normal colon; repeat in 2015 due to [place] of colon cancer   COLONOSCOPY N/A 01/09/2014   GYI:[id number] colonic polyps-removed as described   COLONOSCOPY N/A 09/18/2016   Procedure: COLONOSCOPY;  Surgeon: Daneil Dolin, MD;  [facility];  Service: Endoscopy;  Laterality: N/A;  730    ESOPHAGOGASTRODUODENOSCOPY  10/04/2007   RMR: Distal esophageal erosions consistent with erosive reflux esophagitis, patulous gastroesophageal junction status post passage of a  Maloney dilator, 33 [place].  Otherwise, unremarkable esophagus.  Hiatal hernia.  Otherwise normal stomach.  Bulbar erosion   ESOPHAGOGASTRODUODENOSCOPY N/A 01/09/2014   Erosive reflux esophagitis. Small hiatal hernia   I & D EXTREMITY Left 07/26/2018   Procedure: IRRIGATION AND DEBRIDEMENT EXTREMITY;  Surgeon: Dayna Barker, MD;  Location: [facility];  Service: Plastics;  Laterality: Left;    PERCUTANEOUS PINNING Left 07/26/2018   Procedure: PERCUTANEOUS PINNING EXTREMITY;  Surgeon: Dayna Barker, MD;  Location: [place];  Service: Plastics;  Laterality: Left;    Current Medications: Current Meds  Medication Sig   cetirizine (ZYRTEC ALLERGY) 10 MG tablet Take 1 tablet (10 mg total) by mouth daily.   hydrochlorothiazide (HYDRODIURIL) 25 MG tablet Take 1 tablet (25 mg total) by mouth daily.   lisinopril (ZESTRIL) 40 MG tablet Take 1 tablet (40 mg total) by mouth daily.   pantoprazole (PROTONIX) 40 MG tablet Take 1 tablet (40 mg total) by mouth daily.   rosuvastatin (CRESTOR) 10 MG tablet Take 10 mg by mouth daily.     Allergies:   Patient has no known allergies.   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   Smoking status: Every Day    Packs/day: 0.25    Years: 10.00    Pack years: 2.50    Types: Cigarettes   Smokeless tobacco: Never  Vaping Use   Vaping Use: Never used  Substance and Sexual Activity   Alcohol use: Yes    Alcohol/week: 0.0 standard drinks    Comment: occasional/wine on the weekends   Drug use: No   Sexual activity: Not on file  Other Topics Concern   Not on file  Social History Narrative   Right handed   Lives alone one story home   Social Determinants of Health   Financial Resource Strain: Not on file  Food Insecurity: Not on file  Transportation Needs: Not on file  Physical Activity: Not on file  Stress: Not on file  Social Connections: Not on file     Family History: The patient's family history includes Colon cancer in his sister; Diabetes in his father and another family member; Heart attack in his maternal uncle; Heart disease in his mother;  Hypertension in his father and mother; Prostate cancer in his maternal uncle. There is no history of Pancreatic cancer, Rectal cancer, Esophageal cancer, or Stomach cancer.  ROS:   Please see the history of present illness.     EKGs/Labs/Other Studies Reviewed:    The following studies were reviewed today:  Myoview 08/08/2016: <1 mm ST depressions with T wave inversions at rest and throughout study in leads III and aVF. Defect 1: There is a medium defect of moderate severity present in the basal inferoseptal, basal inferior, basal inferolateral, mid inferoseptal, mid inferior and mid inferolateral location. While this is likely due to soft tissue attenuation artifact, a degree of myocardial scar cannot enitrely be ruled out. This is a low risk study. No ischemic  territories. Nuclear stress EF: 61%. _______________   CT Cardiac Scoring 06/02/2019: Coronary calcium score of 0. This was 0 percentile for age and sex matched control.  EKG:  EKG not ordered today.   Recent Labs: 07/21/2021: ALT 33; BUN 14; Creatinine, Ser 1.05; Hemoglobin 14.1; Platelets 236; Potassium 4.1; Sodium 133  Recent Lipid Panel    Component Value Date/Time   CHOL 208 (H) 10/02/2020 1359   TRIG 265 (H) 10/02/2020 1359   HDL 41 10/02/2020 1359   CHOLHDL 5.1 (H) 10/02/2020 1359   CHOLHDL 8.5 06/23/2008 0810   VLDL 31 06/23/2008 0810   LDLCALC 120 (H) 10/02/2020 1359    Physical Exam:    Vital Signs: BP 132/70   Pulse 82   Ht '5\' 9"'$  (1.753 m)   Wt 250 lb 6.4 oz (113.6 kg)   SpO2 98%   BMI 36.98 kg/m     Wt Readings from Last 3 Encounters:  10/30/21 250 lb 6.4 oz (113.6 kg)  09/23/21 245 lb (111.1 kg)  08/20/21 256 lb 8 oz (116.3 kg)     General: 51 y.o. African-American male in no acute distress. HEENT: Normocephalic and atraumatic. Sclera clear.  Neck: Supple. No carotid bruits. No JVD. Heart: RRR. Distinct S1 and S2. No murmurs, gallops, or rubs. Radial pulses 2+ and equal bilaterally. Lungs: No  increased work of breathing. Clear to ausculation bilaterally. No wheezes, rhonchi, or rales.  Abdomen: Soft, non-distended, and non-tender to palpation.  Extremities: No lower extremity edema.    Skin: Warm and dry. Neuro: Alert and oriented x3. No focal deficits. Psych: Normal affect. Responds appropriately.  Assessment:    1. Atypical chest pain   2. Primary hypertension   3. Hyperlipidemia, unspecified hyperlipidemia type   4. Obstructive sleep apnea   5. Morbid obesity ([place])   6. Tobacco abuse     Plan:    History of Atypical Chest Pain GERD with History of Esophagitis and Gastritis History of atypical chest pain. Myoview in 2018 was low risk. Coronary calcium score in 05/2019 was 0. He has history of GERD with known esophagitis and gastritis. At last visit in 02/2021, he reported recurrent atypical chest pain that was felt to be GI in nature. He was referred to GI and underwent EGD/colonoscopy which did show some gastritis and ectopic gastric mucosa in the esophagus.  - Occasional chest discomfort that feels like prior reflux but this occurs rarely. No exertional chest pain. - Does not sounds like cardiac chest pain. No additional cardiac work-up necessary. Continue PPI.   Hypertension BP well controlled. - Continue current medications: Lisinopril '40mg'$  daily and HCTZ '25mg'$  daily.    Hyperlipidemia Lipid panel in 09/2020: Total Cholesterol 208, Triglycerides 265, HDL 41, LDL 120.  - PCP started Crestor '10mg'$  daily at time of last labs. He states he has only been taking this as needed. Recommended he go back to taking this daily. - Followed by PCP.   Obstructive Sleep Apnea Sleep study in 09/2021 showed severe sleep apnea - In the process of starting CPAP. Insurance denied CPAP titration so APAP order was sent to [facility].   Obesity BMI 36.9. His weight is down 8lbs since last visit. He is trying to walk more outside of work and is eating healthy (a lot of baked chicken and  vegetables). Encouraged patient to keep up the good work.    Tobacco Abuse Patient continues to smoke but is trying to cut back. Currently down to 4 cigarettes per day. - Congratulated  patient on the progress so far and encouraged him to continue to work towards complete cessation.  Disposition: Follow up in 1 year.   Medication Adjustments/Labs and Tests Ordered: Current medicines are reviewed at length with the patient today.  Concerns regarding medicines are outlined above.  No orders of the defined types were placed in this encounter.  No orders of the defined types were placed in this encounter.   Patient Instructions  Medication Instructions:  Your physician recommends that you continue on your current medications as directed. Please refer to the Current Medication list given to you today.  *If you need a refill on your cardiac medications before your next appointment, please call your pharmacy*   Lab Work: NONE ordered at this time of appointment   If you have labs (blood work) drawn today and your tests are completely normal, you will receive your results only by: [place] (if you have MyChart) OR A paper copy in the mail If you have any lab test that is abnormal or we need to change your treatment, we will call you to review the results.   Testing/Procedures: NONE ordered at this time of appointment     Follow-Up: At [facility], you and your health needs are our priority.  As part of our continuing mission to provide you with exceptional heart care, we have created designated Provider Care Teams.  These Care Teams include your primary Cardiologist (physician) and Advanced Practice Providers (APPs -  Physician Assistants and Nurse Practitioners) who all work together to provide you with the care you need, when you need it.  We recommend signing up for the patient portal called "MyChart".  Sign up information is provided on this After Visit Summary.  MyChart is  used to connect with patients for Virtual Visits (Telemedicine).  Patients are able to view lab/test results, encounter notes, upcoming appointments, etc.  Non-urgent messages can be sent to your provider as well.   To learn more about what you can do with MyChart, go to [email or web address].    Your next appointment:   1 year(s)  The format for your next appointment:   In Person  Provider:   Quay Burow, MD     Other Instructions   Important Information About Sugar         Signed, Darreld Mclean, PA-C  10/30/2021 8:56 AM    [place]

## 2021-10-28 NOTE — Telephone Encounter (Signed)
*  STAT* If patient is at the pharmacy, call can be transferred to refill team.   1. Which medications need to be refilled? (please list name of each medication and dose if known)  Lisinopril  2. Which pharmacy/location (including street and city if local pharmacy) is medication to be sent to? [address] [place]  3. Do they need a 30 day or 90 day supply? Need  enough until his appointment this Wednesday(10-30-21)-completely out of it, please call today

## 2021-10-28 NOTE — Telephone Encounter (Signed)
Medication Refill - Medication: hydrochlorothiazide (HYDRODIURIL) 25 MG tablet  Has the patient contacted their pharmacy? Yes.   He got no response.  Pt not seen in over a year.  Appt scheduled 12/05/21  Preferred Pharmacy (with phone number or street name): [facility] ([place]), [place] - [address] Has the patient been seen for an appointment in the last year OR does the patient have an upcoming appointment? Yes.    Agent: Please be advised that RX refills may take up to 3 business days. We ask that you follow-up with your pharmacy.

## 2021-10-29 MED ORDER — HYDROCHLOROTHIAZIDE 25 MG PO TABS: 25.0000 mg | ORAL_TABLET | Freq: Every day | ORAL | 0 refills | Status: DC

## 2021-10-29 MED ORDER — LISINOPRIL 40 MG PO TABS: 40.0000 mg | ORAL_TABLET | Freq: Every day | ORAL | 1 refills | Status: DC

## 2021-10-29 NOTE — Telephone Encounter (Signed)
Requested Prescriptions  Pending Prescriptions Disp Refills  . hydrochlorothiazide (HYDRODIURIL) 25 MG tablet 38 tablet 0    Sig: Take 1 tablet (25 mg total) by mouth daily.     Cardiovascular: Diuretics - Thiazide Failed - 10/28/2021 10:48 AM      Failed - Na in normal range and within 180 days    Sodium  Date Value Ref Range Status  07/21/2021 133 (L) 135 - 145 mmol/L Final  10/02/2020 138 134 - 144 mmol/L Final         Failed - Last BP in normal range    BP Readings from Last 1 Encounters:  08/20/21 (!) 142/90         Failed - Valid encounter within last 6 months    Recent Outpatient Visits          1 year ago Chronic bilateral low back pain without sciatica   Primary Care at [facility], Bayard Beaver, MD   1 year ago Screening for diabetes mellitus   Primary Care at [facility], [place], NP   2 years ago Annual physical exam   Primary Care at [facility], Bayard Beaver, MD   2 years ago Essential hypertension   Primary Care at [facility], MD   2 years ago Chest pain, unspecified type   Primary Care at [facility], Dionne Bucy, [place]      Future Appointments            Tomorrow Sande Rives E, PA-C [place], [place]   In 1 month [name], MD Primary Care at [place] in normal range and within 180 days    Creat  Date Value Ref Range Status  06/01/2012 1.23 0.50 - 1.35 mg/dL Final   Creatinine, Ser  Date Value Ref Range Status  07/21/2021 1.05 0.61 - 1.24 mg/dL Final         Passed - K in normal range and within 180 days    Potassium  Date Value Ref Range Status  07/21/2021 4.1 3.5 - 5.1 mmol/L Final

## 2021-10-30 ENCOUNTER — Encounter: Payer: Self-pay | Admitting: Student

## 2021-10-30 ENCOUNTER — Ambulatory Visit (INDEPENDENT_AMBULATORY_CARE_PROVIDER_SITE_OTHER): Payer: BC Managed Care – PPO | Admitting: Student

## 2021-10-30 VITALS — BP 132/70 | HR 82 | Ht 69.0 in | Wt 250.4 lb

## 2021-10-30 DIAGNOSIS — G4733 Obstructive sleep apnea (adult) (pediatric): Secondary | ICD-10-CM | POA: Diagnosis not present

## 2021-10-30 DIAGNOSIS — R0789 Other chest pain: Secondary | ICD-10-CM | POA: Diagnosis not present

## 2021-10-30 DIAGNOSIS — Z720 Tobacco use: Secondary | ICD-10-CM

## 2021-10-30 DIAGNOSIS — E785 Hyperlipidemia, unspecified: Secondary | ICD-10-CM | POA: Diagnosis not present

## 2021-10-30 DIAGNOSIS — I10 Essential (primary) hypertension: Secondary | ICD-10-CM | POA: Diagnosis not present

## 2021-10-30 NOTE — Patient Instructions (Signed)
Medication Instructions:  Your physician recommends that you continue on your current medications as directed. Please refer to the Current Medication list given to you today.   *If you need a refill on your cardiac medications before your next appointment, please call your pharmacy*   Lab Work: NONE ordered at this time of appointment   If you have labs (blood work) drawn today and your tests are completely normal, you will receive your results only by: MyChart Message (if you have MyChart) OR A paper copy in the mail If you have any lab test that is abnormal or we need to change your treatment, we will call you to review the results.   Testing/Procedures: NONE ordered at this time of appointment     Follow-Up: At [facility], you and your health needs are our priority.  As part of our continuing mission to provide you with exceptional heart care, we have created designated Provider Care Teams.  These Care Teams include your primary Cardiologist (physician) and Advanced Practice Providers (APPs -  Physician Assistants and Nurse Practitioners) who all work together to provide you with the care you need, when you need it.  We recommend signing up for the patient portal called "MyChart".  Sign up information is provided on this After Visit Summary.  MyChart is used to connect with patients for Virtual Visits (Telemedicine).  Patients are able to view lab/test results, encounter notes, upcoming appointments, etc.  Non-urgent messages can be sent to your provider as well.   To learn more about what you can do with MyChart, go to [email or web address].    Your next appointment:   1 year(s)  The format for your next appointment:   In Person  Provider:   Jonathan Berry, MD     Other Instructions   Important Information About Sugar       

## 2021-11-04 DIAGNOSIS — G4733 Obstructive sleep apnea (adult) (pediatric): Secondary | ICD-10-CM | POA: Diagnosis not present

## 2021-11-04 DIAGNOSIS — G4736 Sleep related hypoventilation in conditions classified elsewhere: Secondary | ICD-10-CM | POA: Diagnosis not present

## 2021-12-04 DIAGNOSIS — G4733 Obstructive sleep apnea (adult) (pediatric): Secondary | ICD-10-CM | POA: Diagnosis not present

## 2021-12-04 DIAGNOSIS — G4736 Sleep related hypoventilation in conditions classified elsewhere: Secondary | ICD-10-CM | POA: Diagnosis not present

## 2021-12-05 ENCOUNTER — Encounter: Payer: Self-pay | Admitting: Family Medicine

## 2021-12-05 ENCOUNTER — Ambulatory Visit (INDEPENDENT_AMBULATORY_CARE_PROVIDER_SITE_OTHER): Payer: BC Managed Care – PPO | Admitting: Family Medicine

## 2021-12-05 VITALS — BP 124/82 | HR 91 | Temp 98.0°F | Resp 16 | Ht 69.0 in | Wt 253.0 lb

## 2021-12-05 DIAGNOSIS — Z0001 Encounter for general adult medical examination with abnormal findings: Secondary | ICD-10-CM | POA: Diagnosis not present

## 2021-12-05 DIAGNOSIS — I10 Essential (primary) hypertension: Secondary | ICD-10-CM

## 2021-12-05 DIAGNOSIS — Z13220 Encounter for screening for lipoid disorders: Secondary | ICD-10-CM

## 2021-12-05 DIAGNOSIS — Z1159 Encounter for screening for other viral diseases: Secondary | ICD-10-CM | POA: Diagnosis not present

## 2021-12-05 DIAGNOSIS — Z130 Encounter for screening for diseases of the blood and blood-forming organs and certain disorders involving the immune mechanism: Secondary | ICD-10-CM

## 2021-12-05 DIAGNOSIS — Z1329 Encounter for screening for other suspected endocrine disorder: Secondary | ICD-10-CM | POA: Diagnosis not present

## 2021-12-05 MED ORDER — ROSUVASTATIN CALCIUM 10 MG PO TABS: 10.0000 mg | ORAL_TABLET | Freq: Every day | ORAL | 1 refills | Status: DC

## 2021-12-05 MED ORDER — PANTOPRAZOLE SODIUM 40 MG PO TBEC: 40.0000 mg | DELAYED_RELEASE_TABLET | Freq: Every day | ORAL | 1 refills | Status: DC

## 2021-12-05 MED ORDER — HYDROCHLOROTHIAZIDE 25 MG PO TABS: 25.0000 mg | ORAL_TABLET | Freq: Every day | ORAL | 1 refills | Status: DC

## 2021-12-05 MED ORDER — LISINOPRIL 40 MG PO TABS: 40.0000 mg | ORAL_TABLET | Freq: Every day | ORAL | 1 refills | Status: DC

## 2021-12-07 NOTE — Progress Notes (Signed)
Established Patient Office Visit  Subjective    Patient ID: ARBER WIEMERS, male    DOB: 1970/12/12  Age: 51 y.o. MRN: [id number]  CC:  Chief Complaint  Patient presents with   Annual Exam    HPI [name] presents for routine annual exam. Patient denies acute complaints or concerns.    Outpatient Encounter Medications as of 12/05/2021  Medication Sig   cetirizine (ZYRTEC ALLERGY) 10 MG tablet Take 1 tablet (10 mg total) by mouth daily.   [DISCONTINUED] hydrochlorothiazide (HYDRODIURIL) 25 MG tablet Take 1 tablet (25 mg total) by mouth daily.   [DISCONTINUED] lisinopril (ZESTRIL) 40 MG tablet Take 1 tablet (40 mg total) by mouth daily.   [DISCONTINUED] pantoprazole (PROTONIX) 40 MG tablet Take 1 tablet (40 mg total) by mouth daily.   [DISCONTINUED] rosuvastatin (CRESTOR) 10 MG tablet Take 10 mg by mouth daily.   hydrochlorothiazide (HYDRODIURIL) 25 MG tablet Take 1 tablet (25 mg total) by mouth daily.   lisinopril (ZESTRIL) 40 MG tablet Take 1 tablet (40 mg total) by mouth daily.   pantoprazole (PROTONIX) 40 MG tablet Take 1 tablet (40 mg total) by mouth daily.   rosuvastatin (CRESTOR) 10 MG tablet Take 1 tablet (10 mg total) by mouth daily.   No facility-administered encounter medications on file as of 12/05/2021.    Past Medical History:  Diagnosis Date   Allergy    Atypical chest pain    GERD (gastroesophageal reflux disease)    Hepatic cyst    Benign by MRI   Hiatal hernia    History of colonic polyps    Hyperlipidemia    Hypertension    Rectal bleeding    Tobacco abuse     Past Surgical History:  Procedure Laterality Date   COLONOSCOPY  01/10/2009     RMR: Normal rectum, normal colon; repeat in 2015 due to [place] of colon cancer   COLONOSCOPY N/A 01/09/2014   [id number] colonic polyps-removed as described   COLONOSCOPY N/A 09/18/2016   Procedure: COLONOSCOPY;  Surgeon: Daneil Dolin, MD;  [facility];  Service: Endoscopy;  Laterality: N/A;   730    ESOPHAGOGASTRODUODENOSCOPY  10/04/2007   RMR: Distal esophageal erosions consistent with erosive reflux esophagitis, patulous gastroesophageal junction status post passage of a  Maloney dilator, 48 [place].  Otherwise, unremarkable esophagus.  Hiatal hernia.  Otherwise normal stomach.  Bulbar erosion   ESOPHAGOGASTRODUODENOSCOPY N/A 01/09/2014   Erosive reflux esophagitis. Small hiatal hernia   I & D EXTREMITY Left 07/26/2018   Procedure: IRRIGATION AND DEBRIDEMENT EXTREMITY;  Surgeon: Dayna Barker, MD;  Location: [facility];  Service: Plastics;  Laterality: Left;   PERCUTANEOUS PINNING Left 07/26/2018   Procedure: PERCUTANEOUS PINNING EXTREMITY;  Surgeon: Dayna Barker, MD;  Location: [facility];  Service: Plastics;  Laterality: Left;    Family History  Problem Relation Age of Onset   Heart disease Mother    Hypertension Mother    Diabetes Father    Hypertension Father    Colon cancer Sister        passed away from colon cancer, in her 56s   Heart attack Maternal Uncle    Prostate cancer Maternal Uncle    Diabetes Other    Pancreatic cancer Neg Hx    Rectal cancer Neg Hx    Esophageal cancer Neg Hx    Stomach cancer Neg Hx     Social History   Socioeconomic History   Marital status: Single    Spouse name: Not  on file   Number of children: 2   Years of education: Not on file   Highest education level: Not on file  Occupational History   Occupation: Scientist, 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   Smoking status: Every Day    Packs/day: 0.25    Years: 10.00    Total pack years: 2.50    Types: Cigarettes   Smokeless tobacco: Never  Vaping Use   Vaping Use: Never used  Substance and Sexual Activity   Alcohol use: Yes    Alcohol/week: 0.0 standard drinks of alcohol    Comment: occasional/wine on the weekends   Drug use: No   Sexual activity: Not on file  Other Topics Concern   Not on file  Social History Narrative    Right handed   Lives alone one story home   Social Determinants of Health   Financial Resource Strain: Not on file  Food Insecurity: Not on file  Transportation Needs: Not on file  Physical Activity: Not on file  Stress: Not on file  Social Connections: Not on file  Intimate Partner Violence: Not on file    Review of Systems  All other systems reviewed and are negative.       Objective    BP 124/82   Pulse 91   Temp 98 F (36.7 C) (Oral)   Resp 16   Ht _0  (1.753 m)   Wt 253 lb (114.8 kg)   SpO2 96%   BMI 37.36 kg/m   Physical Exam Vitals and nursing note reviewed.  Constitutional:      General: He is not in acute distress. HENT:     Head: Normocephalic and atraumatic.     Right Ear: Tympanic membrane, ear canal and external ear normal.     Left Ear: Tympanic membrane, ear canal and external ear normal.     Nose: Nose normal.     Mouth/Throat:     Mouth: Mucous membranes are moist.     Pharynx: Oropharynx is clear.  Eyes:     Conjunctiva/sclera: Conjunctivae normal.     Pupils: Pupils are equal, round, and reactive to light.  Neck:     Thyroid: No thyromegaly.  Cardiovascular:     Rate and Rhythm: Normal rate and regular rhythm.     Heart sounds: Normal heart sounds. No murmur heard. Pulmonary:     Effort: Pulmonary effort is normal.     Breath sounds: Normal breath sounds.  Abdominal:     General: There is no distension.     Palpations: Abdomen is soft. There is no mass.     Tenderness: There is no abdominal tenderness.     Hernia: There is no hernia in the left inguinal area or right inguinal area.  Genitourinary:    Penis: Normal and circumcised.      Testes: Normal.  Musculoskeletal:        General: Normal range of motion.     Cervical back: Normal range of motion and neck supple.     Right lower leg: No edema.     Left lower leg: No edema.  Skin:    General: Skin is warm and dry.  Neurological:     General: No focal deficit present.      Mental Status: He is alert and oriented to person, place, and time. Mental status is at baseline.  Psychiatric:  Mood and Affect: Mood normal.        Behavior: Behavior normal.         Assessment & Plan:   1. annual physical  - hydrochlorothiazide (HYDRODIURIL) 25 MG tablet; Take 1 tablet (25 mg total) by mouth daily.  Dispense: 90 tablet; Refill: 1 - lisinopril (ZESTRIL) 40 MG tablet; Take 1 tablet (40 mg total) by mouth daily.  Dispense: 90 tablet; Refill: 1 - CMP14+EGFR  2. Screening for deficiency anemia  - CBC with Differential  3. Screening for lipid disorders  - Lipid Panel  4. Screening for endocrine/metabolic/immunity disorders  - Hemoglobin A1c  5. Need for hepatitis C screening test  - Hepatitis C Antibody    No follow-ups on file.   Becky Sax, MD

## 2021-12-12 ENCOUNTER — Encounter: Payer: Self-pay | Admitting: Family Medicine

## 2021-12-12 ENCOUNTER — Ambulatory Visit (INDEPENDENT_AMBULATORY_CARE_PROVIDER_SITE_OTHER): Payer: BC Managed Care – PPO | Admitting: Family Medicine

## 2021-12-12 VITALS — BP 123/81 | HR 92 | Temp 98.1°F | Resp 16 | Wt 251.8 lb

## 2021-12-12 DIAGNOSIS — M25512 Pain in left shoulder: Secondary | ICD-10-CM | POA: Diagnosis not present

## 2021-12-12 DIAGNOSIS — R1011 Right upper quadrant pain: Secondary | ICD-10-CM

## 2021-12-12 NOTE — Progress Notes (Signed)
Established Patient Office Visit  Subjective    Patient ID: [name], male    DOB: Sep 27, 1970  Age: 51 y.o. MRN: [id number]  CC:  Chief Complaint  Patient presents with   body pain    HPI [name] presents with complaint of left shoulder pain and generalized right abdominal/flank pain. Patient denies known trauma or injury. Patient is right hand dominant.    Outpatient Encounter Medications as of 12/12/2021  Medication Sig   cetirizine (ZYRTEC ALLERGY) 10 MG tablet Take 1 tablet (10 mg total) by mouth daily.   hydrochlorothiazide (HYDRODIURIL) 25 MG tablet Take 1 tablet (25 mg total) by mouth daily.   lisinopril (ZESTRIL) 40 MG tablet Take 1 tablet (40 mg total) by mouth daily.   pantoprazole (PROTONIX) 40 MG tablet Take 1 tablet (40 mg total) by mouth daily.   rosuvastatin (CRESTOR) 10 MG tablet Take 1 tablet (10 mg total) by mouth daily.   No facility-administered encounter medications on file as of 12/12/2021.    Past Medical History:  Diagnosis Date   Allergy    Atypical chest pain    GERD (gastroesophageal reflux disease)    Hepatic cyst    Benign by MRI   Hiatal hernia    History of colonic polyps    Hyperlipidemia    Hypertension    Rectal bleeding    Tobacco abuse     Past Surgical History:  Procedure Laterality Date   COLONOSCOPY  01/10/2009     RMR: Normal rectum, normal colon; repeat in 2015 due to [place] of colon cancer   COLONOSCOPY N/A 01/09/2014   [id number] colonic polyps-removed as described   COLONOSCOPY N/A 09/18/2016   Procedure: COLONOSCOPY;  Surgeon: Daneil Dolin, MD;  [facility];  Service: Endoscopy;  Laterality: N/A;  730    ESOPHAGOGASTRODUODENOSCOPY  10/04/2007   RMR: Distal esophageal erosions consistent with erosive reflux esophagitis, patulous gastroesophageal junction status post passage of a  Maloney dilator, 64 [place].  Otherwise, unremarkable esophagus.  Hiatal hernia.  Otherwise normal stomach.  Bulbar  erosion   ESOPHAGOGASTRODUODENOSCOPY N/A 01/09/2014   Erosive reflux esophagitis. Small hiatal hernia   I & D EXTREMITY Left 07/26/2018   Procedure: IRRIGATION AND DEBRIDEMENT EXTREMITY;  Surgeon: Dayna Barker, MD;  Location: [place];  Service: Plastics;  Laterality: Left;   PERCUTANEOUS PINNING Left 07/26/2018   Procedure: PERCUTANEOUS PINNING EXTREMITY;  Surgeon: Dayna Barker, MD;  Location: [place];  Service: Plastics;  Laterality: Left;    Family History  Problem Relation Age of Onset   Heart disease Mother    Hypertension Mother    Diabetes Father    Hypertension Father    Colon cancer Sister        passed away from colon cancer, in her 98s   Heart attack Maternal Uncle    Prostate cancer Maternal Uncle    Diabetes Other    Pancreatic cancer Neg Hx    Rectal cancer Neg Hx    Esophageal cancer Neg Hx    Stomach cancer Neg Hx     Social History   Socioeconomic History   Marital status: Single    Spouse name: Not on file   Number of children: 2   Years of education: Not on file   Highest education level: Not on file  Occupational History   Occupation: Scientist, 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   Smoking status:  Every Day    Packs/day: 0.25    Years: 10.00    Total pack years: 2.50    Types: Cigarettes   Smokeless tobacco: Never  Vaping Use   Vaping Use: Never used  Substance and Sexual Activity   Alcohol use: Yes    Alcohol/week: 0.0 standard drinks of alcohol    Comment: occasional/wine on the weekends   Drug use: No   Sexual activity: Not on file  Other Topics Concern   Not on file  Social History Narrative   Right handed   Lives alone one story home   Social Determinants of Health   Financial Resource Strain: Not on file  Food Insecurity: Not on file  Transportation Needs: Not on file  Physical Activity: Not on file  Stress: Not on file  Social Connections: Not on file  Intimate Partner Violence:  Not on file    Review of Systems  Gastrointestinal:  Positive for abdominal pain. Negative for blood in stool, constipation, diarrhea, nausea and vomiting.  Genitourinary:  Positive for flank pain. Negative for dysuria.  All other systems reviewed and are negative.       Objective    BP 123/81   Pulse 92   Temp 98.1 F (36.7 C) (Oral)   Resp 16   Wt 251 lb 12.8 oz (114.2 kg)   SpO2 95%   BMI 37.18 kg/m   Physical Exam Vitals and nursing note reviewed.  Constitutional:      General: He is not in acute distress. Cardiovascular:     Rate and Rhythm: Normal rate and regular rhythm.  Pulmonary:     Effort: Pulmonary effort is normal.     Breath sounds: Normal breath sounds.  Abdominal:     Palpations: Abdomen is soft.     Tenderness: There is abdominal tenderness.  Musculoskeletal:     Right shoulder: Normal.     Left shoulder: Tenderness present. No swelling or deformity. Decreased range of motion.  Neurological:     General: No focal deficit present.     Mental Status: He is alert and oriented to person, place, and time.         Assessment & Plan:   1. Acute pain of left shoulder Referral to ortho for further eval/mgt - Ambulatory referral to Orthopedic Surgery  2. Right upper quadrant abdominal pain Patient has had multiple imaging done recently that has been unremarkable. Encouraged food diary and trials of lactose and gluten free diet. Patient also to d/c statin for period of time as well.     Return in about 6 weeks (around 01/23/2022) for follow up.   Becky Sax, MD

## 2021-12-13 LAB — CBC WITH DIFFERENTIAL/PLATELET
Basophils Absolute: 0 10*3/uL (ref 0.0–0.2)
Basos: 0 %
EOS (ABSOLUTE): 0.2 10*3/uL (ref 0.0–0.4)
Eos: 2 %
Hematocrit: 40.6 % (ref 37.5–51.0)
Hemoglobin: 13.5 g/dL (ref 13.0–17.7)
Immature Grans (Abs): 0 10*3/uL (ref 0.0–0.1)
Immature Granulocytes: 1 %
Lymphocytes Absolute: 3.7 10*3/uL — ABNORMAL HIGH (ref 0.7–3.1)
Lymphs: 42 %
MCH: 29 pg (ref 26.6–33.0)
MCHC: 33.3 g/dL (ref 31.5–35.7)
MCV: 87 fL (ref 79–97)
Monocytes Absolute: 0.4 10*3/uL (ref 0.1–0.9)
Monocytes: 5 %
Neutrophils Absolute: 4.4 10*3/uL (ref 1.4–7.0)
Neutrophils: 50 %
Platelets: 238 10*3/uL (ref 150–450)
RBC: 4.66 x10E6/uL (ref 4.14–5.80)
RDW: 13.3 % (ref 11.6–15.4)
WBC: 8.7 10*3/uL (ref 3.4–10.8)

## 2021-12-13 LAB — CMP14+EGFR
ALT: 19 IU/L (ref 0–44)
AST: 19 IU/L (ref 0–40)
Albumin/Globulin Ratio: 0.7 — ABNORMAL LOW (ref 1.2–2.2)
Albumin: 3.7 g/dL — ABNORMAL LOW (ref 3.8–4.9)
Alkaline Phosphatase: 69 IU/L (ref 44–121)
BUN/Creatinine Ratio: 11 (ref 9–20)
BUN: 13 mg/dL (ref 6–24)
Bilirubin Total: 0.3 mg/dL (ref 0.0–1.2)
CO2: 22 mmol/L (ref 20–29)
Calcium: 9.6 mg/dL (ref 8.7–10.2)
Chloride: 101 mmol/L (ref 96–106)
Creatinine, Ser: 1.16 mg/dL (ref 0.76–1.27)
Globulin, Total: 5.3 g/dL — ABNORMAL HIGH (ref 1.5–4.5)
Glucose: 91 mg/dL (ref 70–99)
Potassium: 4.4 mmol/L (ref 3.5–5.2)
Sodium: 135 mmol/L (ref 134–144)
Total Protein: 9 g/dL — ABNORMAL HIGH (ref 6.0–8.5)
eGFR: 76 mL/min/{1.73_m2} (ref >59)

## 2021-12-13 LAB — HEPATITIS C ANTIBODY: Hep C Virus Ab: NONREACTIVE

## 2021-12-13 LAB — LIPID PANEL
Chol/HDL Ratio: 5.9 ratio — ABNORMAL HIGH (ref 0.0–5.0)
Cholesterol, Total: 183 mg/dL (ref 100–199)
HDL: 31 mg/dL — ABNORMAL LOW (ref >39)
LDL Chol Calc (NIH): 68 mg/dL (ref 0–99)
Triglycerides: 540 mg/dL — ABNORMAL HIGH (ref 0–149)
VLDL Cholesterol Cal: 84 mg/dL — ABNORMAL HIGH (ref 5–40)

## 2021-12-13 LAB — HEMOGLOBIN A1C
Est. average glucose Bld gHb Est-mCnc: 123 mg/dL
Hgb A1c MFr Bld: 5.9 % — ABNORMAL HIGH (ref 4.8–5.6)

## 2021-12-18 ENCOUNTER — Ambulatory Visit (INDEPENDENT_AMBULATORY_CARE_PROVIDER_SITE_OTHER): Payer: BC Managed Care – PPO | Admitting: Orthopaedic Surgery

## 2021-12-18 ENCOUNTER — Ambulatory Visit (INDEPENDENT_AMBULATORY_CARE_PROVIDER_SITE_OTHER): Payer: BC Managed Care – PPO

## 2021-12-18 DIAGNOSIS — M5412 Radiculopathy, cervical region: Secondary | ICD-10-CM

## 2021-12-18 MED ORDER — METHOCARBAMOL 500 MG PO TABS: 500.0000 mg | ORAL_TABLET | Freq: Two times a day (BID) | ORAL | 2 refills | Status: DC | PRN

## 2021-12-18 MED ORDER — PREDNISONE 10 MG (21) PO TBPK: ORAL_TABLET | ORAL | 0 refills | Status: DC

## 2021-12-18 NOTE — Progress Notes (Signed)
Office Visit Note   Patient: [name]           Date of Birth: April 13, 1971           MRN: [id number] Visit Date: 12/18/2021              Requested by: [name], [address] [place],  [place] [address] PCP: [name], MD   Assessment & Plan: Visit Diagnoses:  1. Radiculopathy of cervical spine     Plan: Impression is cervical spine radiculopathy.  At this point, I would like to start patient on a steroid taper muscle relaxer as well as start him in outpatient physical therapy.  Referral has been made.  If his symptoms do not improve over the next 6 to 8 weeks or worsen in the meantime he will let us know we will get an MRI.  Otherwise, follow-up as needed.  Call with concerns or questions.  Follow-Up Instructions: Return if symptoms worsen or fail to improve.   Orders:  Orders Placed This Encounter  Procedures   XR Shoulder Left   XR Cervical Spine 2 or 3 views   Ambulatory referral to Physical Therapy   Meds ordered this encounter  Medications   predniSONE (STERAPRED UNI-PAK 21 TAB) 10 MG (21) TBPK tablet    Sig: Take as directed    Dispense:  21 tablet    Refill:  0   methocarbamol (ROBAXIN) 500 MG tablet    Sig: Take 1 tablet (500 mg total) by mouth 2 (two) times daily as needed for muscle spasms.    Dispense:  20 tablet    Refill:  2      Procedures: No procedures performed   Clinical Data: No additional findings.   Subjective: Chief Complaint  Patient presents with   Left Shoulder - Pain    HPI patient is a pleasant 51 year old gentleman who comes in today with left shoulder pain for the past 2 to 3 weeks.  He denies any injury or change in activity.  The pain he has is to the top of the shoulder and radiates to the lateral neck and into the parascapular region.  He notes tingling sensations into the deltoid.  Symptoms are worse with any movement of the neck but also occur without any aggravators while he is at rest.  He  does note weakness to the left upper extremity but this is been ongoing since previous hand injury years ago.  No new weakness.  Review of Systems as detailed in HPI.  All others reviewed are negative.   Objective: Vital Signs: There were no vitals taken for this visit.  Physical Exam well-developed well-nourished gentleman in no acute distress.  Alert and oriented x3.  Ortho Exam cervical spine exam shows no spinous tenderness.  He has moderate left-sided paraspinous musculature tenderness in addition to tenderness along the parascapular border and top of the shoulder.  Increased pain and stiffness with range of motion of the neck.  He is neurovascular intact distally.  Unremarkable exam of the left shoulder  Specialty Comments:  No specialty comments available.  Imaging: XR Shoulder Left  Result Date: 12/18/2021 Moderate degenerative changes in the [facility] joint.    PMFS History: Patient Active Problem List   Diagnosis Date Noted   Hyperlipidemia 02/17/2019   Lesion of liver 09/23/2017   Atypical chest pain 05/01/2016   Abnormal ultrasound 07/19/2015   ALCOHOL ABUSE 09/06/2008   TOBACCO ABUSE 09/06/2008   Essential hypertension  09/06/2008   Chronic GERD 09/06/2008   Past Medical History:  Diagnosis Date   Allergy    Atypical chest pain    GERD (gastroesophageal reflux disease)    Hepatic cyst    Benign by MRI   Hiatal hernia    History of colonic polyps    Hyperlipidemia    Hypertension    Rectal bleeding    Tobacco abuse     Family History  Problem Relation Age of Onset   Heart disease Mother    Hypertension Mother    Diabetes Father    Hypertension Father    Colon cancer Sister        passed away from colon cancer, in her 61s   Heart attack Maternal Uncle    Prostate cancer Maternal Uncle    Diabetes Other    Pancreatic cancer Neg Hx    Rectal cancer Neg Hx    Esophageal cancer Neg Hx    Stomach cancer Neg Hx     Past Surgical History:  Procedure  Laterality Date   COLONOSCOPY  01/10/2009     RMR: Normal rectum, normal colon; repeat in 2015 due to [place] of colon cancer   COLONOSCOPY N/A 01/09/2014   ELF:[id number] colonic polyps-removed as described   COLONOSCOPY N/A 09/18/2016   Procedure: COLONOSCOPY;  Surgeon: Daneil Dolin, MD;  [facility];  Service: Endoscopy;  Laterality: N/A;  730    ESOPHAGOGASTRODUODENOSCOPY  10/04/2007   RMR: Distal esophageal erosions consistent with erosive reflux esophagitis, patulous gastroesophageal junction status post passage of a  Maloney dilator, [place].  Otherwise, unremarkable esophagus.  Hiatal hernia.  Otherwise normal stomach.  Bulbar erosion   ESOPHAGOGASTRODUODENOSCOPY N/A 01/09/2014   Erosive reflux esophagitis. 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

## 2021-12-21 ENCOUNTER — Other Ambulatory Visit: Payer: Self-pay | Admitting: Family Medicine

## 2021-12-21 MED ORDER — ROSUVASTATIN CALCIUM 10 MG PO TABS: 10.0000 mg | ORAL_TABLET | Freq: Every day | ORAL | 0 refills | Status: DC

## 2021-12-23 ENCOUNTER — Ambulatory Visit: Payer: BC Managed Care – PPO | Admitting: Physical Therapy

## 2021-12-25 ENCOUNTER — Telehealth: Payer: Self-pay | Admitting: Family Medicine

## 2021-12-25 NOTE — Telephone Encounter (Signed)
Copied from [place] [id number]. Topic: General - Other >> Dec 25, 2021  8:35 AM Everette C wrote: Reason for CRM: The patient has called to share that they're continuing to experience discomfort in their shoulders and lower back   The patient has called to request orders for a, previously discussed, MRI   The patient will start physical therapy next week and would like to have the imaging done as soon as possible   Please contact further when available

## 2021-12-31 NOTE — Telephone Encounter (Signed)
Spoke with patient and he would like to proceed with MRI that PCP and he spoke of in last office visit. Patient said that he is still in pain with shoulder and back

## 2022-01-02 ENCOUNTER — Ambulatory Visit: Payer: BC Managed Care – PPO | Admitting: Physical Therapy

## 2022-01-04 ENCOUNTER — Other Ambulatory Visit: Payer: Self-pay

## 2022-01-04 ENCOUNTER — Encounter (HOSPITAL_COMMUNITY): Payer: Self-pay | Admitting: Emergency Medicine

## 2022-01-04 ENCOUNTER — Emergency Department (HOSPITAL_COMMUNITY)
Admission: EM | Admit: 2022-01-04 | Discharge: 2022-01-04 | Disposition: A | Discharge status: Home / Self Care | Payer: BC Managed Care – PPO | Attending: Emergency Medicine | Admitting: Emergency Medicine

## 2022-01-04 ENCOUNTER — Emergency Department (HOSPITAL_COMMUNITY): Payer: BC Managed Care – PPO

## 2022-01-04 DIAGNOSIS — I10 Essential (primary) hypertension: Secondary | ICD-10-CM | POA: Insufficient documentation

## 2022-01-04 DIAGNOSIS — Z79899 Other long term (current) drug therapy: Secondary | ICD-10-CM | POA: Insufficient documentation

## 2022-01-04 DIAGNOSIS — M5441 Lumbago with sciatica, right side: Secondary | ICD-10-CM | POA: Diagnosis not present

## 2022-01-04 DIAGNOSIS — M545 Low back pain, unspecified: Secondary | ICD-10-CM | POA: Diagnosis not present

## 2022-01-04 DIAGNOSIS — G4733 Obstructive sleep apnea (adult) (pediatric): Secondary | ICD-10-CM | POA: Diagnosis not present

## 2022-01-04 DIAGNOSIS — M549 Dorsalgia, unspecified: Secondary | ICD-10-CM | POA: Diagnosis not present

## 2022-01-04 DIAGNOSIS — M5442 Lumbago with sciatica, left side: Secondary | ICD-10-CM | POA: Diagnosis not present

## 2022-01-04 DIAGNOSIS — G4736 Sleep related hypoventilation in conditions classified elsewhere: Secondary | ICD-10-CM | POA: Diagnosis not present

## 2022-01-04 LAB — CBC WITH DIFFERENTIAL/PLATELET
Abs Immature Granulocytes: 0.04 10*3/uL (ref 0.00–0.07)
Basophils Absolute: 0 10*3/uL (ref 0.0–0.1)
Basophils Relative: 0 %
Eosinophils Absolute: 0 10*3/uL (ref 0.0–0.5)
Eosinophils Relative: 1 %
HCT: 41.3 % (ref 39.0–52.0)
Hemoglobin: 13.7 g/dL (ref 13.0–17.0)
Immature Granulocytes: 1 %
Lymphocytes Relative: 24 %
Lymphs Abs: 2 10*3/uL (ref 0.7–4.0)
MCH: 29.1 pg (ref 26.0–34.0)
MCHC: 33.2 g/dL (ref 30.0–36.0)
MCV: 87.9 fL (ref 80.0–100.0)
Monocytes Absolute: 0.1 10*3/uL (ref 0.1–1.0)
Monocytes Relative: 1 %
Neutro Abs: 6.3 10*3/uL (ref 1.7–7.7)
Neutrophils Relative %: 73 %
Platelets: 221 10*3/uL (ref 150–400)
RBC: 4.7 MIL/uL (ref 4.22–5.81)
RDW: 13.2 % (ref 11.5–15.5)
WBC: 8.5 10*3/uL (ref 4.0–10.5)
nRBC: 0 % (ref 0.0–0.2)

## 2022-01-04 LAB — COMPREHENSIVE METABOLIC PANEL
ALT: 31 U/L (ref 0–44)
AST: 33 U/L (ref 15–41)
Albumin: 3.4 g/dL — ABNORMAL LOW (ref 3.5–5.0)
Alkaline Phosphatase: 53 U/L (ref 38–126)
Anion gap: 8 (ref 5–15)
BUN: 18 mg/dL (ref 6–20)
CO2: 24 mmol/L (ref 22–32)
Calcium: 9.6 mg/dL (ref 8.9–10.3)
Chloride: 99 mmol/L (ref 98–111)
Creatinine, Ser: 1.14 mg/dL (ref 0.61–1.24)
GFR, Estimated: >60 mL/min (ref >60)
Glucose, Bld: 100 mg/dL — ABNORMAL HIGH (ref 70–99)
Potassium: 5.2 mmol/L — ABNORMAL HIGH (ref 3.5–5.1)
Sodium: 131 mmol/L — ABNORMAL LOW (ref 135–145)
Total Bilirubin: 0.4 mg/dL (ref 0.3–1.2)
Total Protein: 10.2 g/dL — ABNORMAL HIGH (ref 6.5–8.1)

## 2022-01-04 MED ORDER — PREDNISONE 10 MG (21) PO TBPK
ORAL_TABLET | Freq: Every day | ORAL | 0 refills | Status: DC
Start: 2022-01-04 — End: 2022-01-08

## 2022-01-04 MED ORDER — SODIUM CHLORIDE 0.9 % IV BOLUS
500.0000 mL | Freq: Once | INTRAVENOUS | Status: AC
  Administered 2022-01-04: 500 mL via INTRAVENOUS

## 2022-01-04 MED ORDER — KETOROLAC TROMETHAMINE 30 MG/ML IJ SOLN
30.0000 mg | Freq: Once | INTRAMUSCULAR | Status: AC
  Administered 2022-01-04: 30 mg via INTRAMUSCULAR
  Filled 2022-01-04: qty 1

## 2022-01-04 MED ORDER — DEXAMETHASONE SODIUM PHOSPHATE 10 MG/ML IJ SOLN
10.0000 mg | Freq: Once | INTRAMUSCULAR | Status: AC
  Administered 2022-01-04: 10 mg via INTRAMUSCULAR
  Filled 2022-01-04: qty 1

## 2022-01-04 MED ORDER — OXYCODONE-ACETAMINOPHEN 5-325 MG PO TABS
1.0000 | ORAL_TABLET | Freq: Once | ORAL | Status: AC
  Administered 2022-01-04: 1 via ORAL
  Filled 2022-01-04: qty 1

## 2022-01-04 MED ORDER — PREDNISONE 10 MG (21) PO TBPK
ORAL_TABLET | Freq: Every day | ORAL | 0 refills | Status: DC
Start: 2022-01-04 — End: 2022-01-04

## 2022-01-04 MED ORDER — IBUPROFEN 600 MG PO TABS: 600.0000 mg | ORAL_TABLET | Freq: Four times a day (QID) | ORAL | 0 refills | Status: DC | PRN

## 2022-01-04 NOTE — ED Triage Notes (Signed)
Patient here from home experiencing excruciating lower back pain for about 2 weeks now. Feels this on both sides but mostly bothering him on the left side, non radiating. Denies any recent trauma. Due to the pain he is having mobility issues with this. Has been to primary for this and they have attempted to do steroid injections and have given muscle relaxers but this has not provided relief. Aox4.

## 2022-01-04 NOTE — ED Provider Triage Note (Signed)
Emergency Medicine Provider Triage Evaluation Note  HIKEEM ANDERSSON , a 51 y.o. male  was evaluated in triage.  Pt complains of low back pain.  Patient states that he has had 2 weeks of constant low back pain that radiates to his legs, more so on the left.  He states that he has had an x-ray which did not show anything and was given a steroid pack as well as muscle relaxant but has not had any relief of pain.  He describes having increased pain when trying to get out of bed.  He denies any numbness or tingling or weakness of his lower extremities, no saddle anesthesia, incontinence/retention of bowel or bladder, fevers or IV drug use..  Review of Systems  Positive:  Negative:   Physical Exam  BP (!) 140/92 (BP Location: Right Arm)   Pulse 95   Temp 99.5 F (37.5 C) (Oral)   Resp 16   SpO2 96%  Gen:   Awake, no distress   Resp:  Normal effort  MSK:   Moves extremities without difficulty  Other:  L-spine tenderness to palpation  Medical Decision Making  Medically screening exam initiated at 11:27 AM.  Appropriate orders placed.  BREYLON SHERROW was informed that the remainder of the evaluation will be completed by another provider, this initial triage assessment does not replace that evaluation, and the importance of remaining in the ED until their evaluation is complete.     Mickie Hillier, PA-C 01/04/22 1128

## 2022-01-04 NOTE — ED Notes (Signed)
Pt transported to MRI 

## 2022-01-04 NOTE — ED Provider Notes (Signed)
[facility] EMERGENCY DEPARTMENT Provider Note   CSN: [id number] Arrival date & time: 01/04/22  1105     History  Chief Complaint  Patient presents with   Back Pain    [name] is a 51 y.o. male. With past medical history of alcohol abuse, GERD, HLD, HTN, who presents to the emergency department with low back pain.  States he has had 2 weeks of constant low back pain that radiates into both of his legs.  He states that the pain is worse on the left than the right leg.  He states that he has previously had an x-ray for the similar symptoms which "did not show anything" and he was given a steroid pack and muscle relaxant but states that he has not had any relief from this.  He states that he is having worse pain when he is trying to get up out of bed in the morning.  He denies any numbness or tingling or weakness of his lower extremities.  There is no saddle anesthesia, incontinence or retention of bowel or bladder.  He has no history of IV drug use.  No reported fevers.  He has not had any falls or back trauma.   Back Pain Associated symptoms: no numbness and no weakness        Home Medications Prior to Admission medications   Medication Sig Start Date End Date Taking? Authorizing Provider  cetirizine (ZYRTEC ALLERGY) 10 MG tablet Take 1 tablet (10 mg total) by mouth daily. 05/08/21   Jaynee Eagles, PA-C  hydrochlorothiazide (HYDRODIURIL) 25 MG tablet Take 1 tablet (25 mg total) by mouth daily. 12/05/21   [name], MD  lisinopril (ZESTRIL) 40 MG tablet Take 1 tablet (40 mg total) by mouth daily. 12/05/21   [name], MD  methocarbamol (ROBAXIN) 500 MG tablet Take 1 tablet (500 mg total) by mouth 2 (two) times daily as needed for muscle spasms. 12/18/21   Aundra Dubin, PA-C  pantoprazole (PROTONIX) 40 MG tablet Take 1 tablet (40 mg total) by mouth daily. 12/05/21   [name], MD  predniSONE (STERAPRED UNI-PAK 21 TAB) 10 MG (21) TBPK tablet Take  as directed 12/18/21   Aundra Dubin, PA-C  rosuvastatin (CRESTOR) 10 MG tablet Take 1 tablet (10 mg total) by mouth daily. 12/21/21   [name], MD      Allergies    Patient has no known allergies.    Review of Systems   Review of Systems  Musculoskeletal:  Positive for back pain, gait problem and myalgias.  Neurological:  Negative for weakness and numbness.  All other systems reviewed and are negative.   Physical Exam Updated Vital Signs BP (!) 140/92 (BP Location: Right Arm)   Pulse 95   Temp 99.5 F (37.5 C) (Oral)   Resp 16   SpO2 96%  Physical Exam Vitals and nursing note reviewed.  Constitutional:      General: He is not in acute distress.    Appearance: Normal appearance. He is not ill-appearing or toxic-appearing.  HENT:     Head: Normocephalic and atraumatic.     Mouth/Throat:     Mouth: Mucous membranes are moist.     Pharynx: Oropharynx is clear.  Eyes:     General: No scleral icterus.    Extraocular Movements: Extraocular movements intact.     Pupils: Pupils are equal, round, and reactive to light.  Cardiovascular:     Rate and Rhythm: Normal rate and regular  rhythm.     Pulses: Normal pulses.  Pulmonary:     Effort: Pulmonary effort is normal. No respiratory distress.  Abdominal:     General: Bowel sounds are normal.     Palpations: Abdomen is soft.  Musculoskeletal:        General: Tenderness present. No swelling.     Cervical back: Neck supple.     Lumbar back: Tenderness and bony tenderness present.       Back:     Right lower leg: No edema.     Left lower leg: No edema.  Skin:    General: Skin is warm and dry.     Capillary Refill: Capillary refill takes less than 2 seconds.     Findings: No bruising.  Neurological:     General: No focal deficit present.     Mental Status: He is alert and oriented to person, place, and time. Mental status is at baseline.  Psychiatric:        Mood and Affect: Mood normal.        Behavior: Behavior  normal.        Thought Content: Thought content normal.        Judgment: Judgment normal.    ED Results / Procedures / Treatments   Labs (all labs ordered are listed, but only abnormal results are displayed) Labs Reviewed - No data to display  EKG None  Radiology No results found.  Procedures Procedures   Medications Ordered in ED Medications  dexamethasone (DECADRON) injection 10 mg (has no administration in time range)  ketorolac (TORADOL) 30 MG/ML injection 30 mg (has no administration in time range)  oxyCODONE-acetaminophen (PERCOCET/ROXICET) 5-325 MG per tablet 1 tablet (1 tablet Oral Given 01/04/22 1134)   ED Course/ Medical Decision Making/ A&P Clinical Course as of 01/04/22 1509  Sat Jan 04, 2022  1506 F/u on MRI  Steroid dose  F/u with spine [CR]    Clinical Course User Index [CR] Wilnette Kales, PA                           Medical Decision Making Amount and/or Complexity of Data Reviewed Radiology: ordered.  Risk Prescription drug management.  This patient presents to the ED with chief complaint(s) of low back pain with pertinent past medical history of hypertension, alcohol abuse, hyperlipidemia which further complicates the presenting complaint. The complaint involves an extensive differential diagnosis and also carries with it a high risk of complications and morbidity.    The differential diagnosis includes Fracture, subluxation, musculoskeletal strain, epidural abscess, cauda equina, muscle spasm, sciatica or radiculopathy, etc.     Additional history obtained: Additional history obtained from family Records reviewed Care Everywhere/External Records and Primary Care Documents  ED Course and Reassessment: Care of patient being handed off to [place], [place].  Please see their note for the continuation of care.  Patient is pending MRI.  Expect likely radiculopathy symptoms.  If there is any sort of emergent finding, will treat appropriately.  If MRI  shows radiculopathy can give steroid Dosepak and pain control and have follow-up with spine.  Anticipate him being discharged home.   Final Clinical Impression(s) / ED Diagnoses Final diagnoses:  None    Rx / DC Orders ED Discharge Orders     None         Mickie Hillier, PA-C 01/04/22 1509    Charlesetta Shanks, MD 01/10/22 1737

## 2022-01-04 NOTE — Discharge Instructions (Addendum)
Note your work-up today was consistent with some narrowing of the sides of your vertebrae which is most likely the cause of your symptoms.  You did have evidence of an old compression fracture of your spine.  We will treat this with another steroid pack as well as ibuprofen outpatient.  I have attached information with multiple spine doctors in the area.  Please call them at the earliest convenience to set up an appointment.  Your sodium was slightly decreased as well as your potassium levels were slightly increased which is why you gave you the IV fluids while in the emergency department.  Continue to hydrate adequately at home and consider an over-the-counter multivitamin.  Please do not hesitate to return to the emergency department if the worrisome signs and symptoms we discussed become apparent.

## 2022-01-04 NOTE — ED Provider Notes (Signed)
Physical Exam  BP 118/76   Pulse 86   Temp 98.9 F (37.2 C) (Oral)   Resp 20   SpO2 98%   Physical Exam Vitals and nursing note reviewed.  Constitutional:      General: He is not in acute distress.    Appearance: He is well-developed.  HENT:     Head: Normocephalic and atraumatic.  Eyes:     Conjunctiva/sclera: Conjunctivae normal.  Cardiovascular:     Rate and Rhythm: Normal rate and regular rhythm.     Heart sounds: No murmur heard. Pulmonary:     Effort: Pulmonary effort is normal. No respiratory distress.     Breath sounds: Normal breath sounds.  Abdominal:     Palpations: Abdomen is soft.     Tenderness: There is no abdominal tenderness.  Musculoskeletal:        General: No swelling.     Cervical back: Neck supple.     Comments: Patient has midline tenderness of lumbar spine as well as left paraspinal region in the lumbar area.  No overlying skin normalities noted.  Strength 5 out of 5 for lower extremities.  DTRs equal and symmetric bilaterally.  Posterior tibial pulses full and intact bilaterally.  Straight leg raise positive on the left with contralateral straight leg raise positivity on the left.  Patient complaining of no sensory deficits of lower extremities along major nerve distributions.  Skin:    General: Skin is warm and dry.     Capillary Refill: Capillary refill takes less than 2 seconds.  Neurological:     Mental Status: He is alert.  Psychiatric:        Mood and Affect: Mood normal.     Procedures  Procedures  ED Course / MDM      Medical Decision Making Amount and/or Complexity of Data Reviewed Labs: ordered. Radiology:  Decision-making details documented in ED Course.   Patient care handed off from Theodis Blaze, PA-C at shift change.  Plan was to follow-up on MRI assuming outpatient management of results, discharged with steroid pack with close follow-up with neurosurgery.  See prior note for full details.  In short, patient has 2 weeks  of chronic back pain that was relieved with prior steroid pack a muscle relaxant.  He states that since stopping the medication, his pain has returned and he presents for further evaluation.  He had an x-ray performed when he was seen within the past 2 weeks which was negative for any acute abnormalities.  He states pain is sharp in nature with radiation into bilateral legs.  He states pain is worse with movement and is relieved with rest.  He denies fever, saddle anesthesia, bowel/bladder dysfunction, history of IV drug use, prolonged recent steroid use, weakness/sensory deficits in lower extremities, recent back surgeries.  Past medical history significant for hypertension, hyperlipidemia, hiatal hernia, GERD.  Laboratory studies: No leukocytosis or anemia noted.  Platelets within normal range.  Patient has mild hyperkalemia with a potassium of 5.2 as well as hyponatremia with a sodium 131.  Patient was administered 500 mL of normal saline.  Otherwise electrolytes within normal range.  GFR greater than 60 indicating no acute decrease in renal function.  Liver function without abnormality..  Liver enzymes within normal range.  Imaging studies: MRI of lumbar spine: Subacute or chronic subtle superior endplate compression fracture at L3 with faint residual marrow edema.  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  No canal  stenosis a level.  Diffuse marrow heterogenicity without discrete marrow replacing lesion; nonspecific in nature.  Back pain Vitals signs within normal range and stable throughout visit. Laboratory/imaging studies significant for: See above Patient's symptoms likely secondary to findings as laid out on the MRI.  No direct cord compressions or findings of cauda equina, epidural abscess.  Patient to be treated outpatient with steroid pack with close follow-up with neurosurgery outpatient for further evaluation.  Treatment plan  was discussed at length with patient and he knowledge understanding was agreeable to said plan. Worrisome signs and symptoms were discussed with the patient, and the patient acknowledged understanding to return to the ED if noticed. Patient was stable upon discharge.      Wilnette Kales, [place] 01/04/22 Johnnye Lana    Fredia Sorrow, MD 01/16/22 1759

## 2022-01-04 NOTE — ED Notes (Signed)
AVS with prescriptions provided to and discussed with patient and family member at bedside. Pt verbalizes understanding of discharge instructions and denies any questions or concerns at this time. Pt has ride home. Pt ambulated out of department independently with steady gait.  

## 2022-01-06 DIAGNOSIS — M47896 Other spondylosis, lumbar region: Secondary | ICD-10-CM | POA: Diagnosis not present

## 2022-01-06 DIAGNOSIS — S32030A Wedge compression fracture of third lumbar vertebra, initial encounter for closed fracture: Secondary | ICD-10-CM | POA: Diagnosis not present

## 2022-01-07 ENCOUNTER — Other Ambulatory Visit: Payer: Self-pay | Admitting: Orthopaedic Surgery

## 2022-01-07 ENCOUNTER — Ambulatory Visit
Admission: RE | Admit: 2022-01-07 | Discharge: 2022-01-07 | Disposition: A | Discharge status: Home / Self Care | Payer: BC Managed Care – PPO | Source: Ambulatory Visit | Attending: Orthopaedic Surgery | Admitting: Orthopaedic Surgery

## 2022-01-07 DIAGNOSIS — M48061 Spinal stenosis, lumbar region without neurogenic claudication: Secondary | ICD-10-CM | POA: Diagnosis not present

## 2022-01-07 DIAGNOSIS — M545 Low back pain, unspecified: Secondary | ICD-10-CM

## 2022-01-07 DIAGNOSIS — S32030A Wedge compression fracture of third lumbar vertebra, initial encounter for closed fracture: Secondary | ICD-10-CM | POA: Diagnosis not present

## 2022-01-07 DIAGNOSIS — M5126 Other intervertebral disc displacement, lumbar region: Secondary | ICD-10-CM | POA: Diagnosis not present

## 2022-01-07 DIAGNOSIS — M4316 Spondylolisthesis, lumbar region: Secondary | ICD-10-CM | POA: Diagnosis not present

## 2022-01-07 MED ORDER — GADOBENATE DIMEGLUMINE 529 MG/ML IV SOLN
20.0000 mL | Freq: Once | INTRAVENOUS | Status: AC | PRN
  Administered 2022-01-07: 20 mL via INTRAVENOUS

## 2022-01-08 ENCOUNTER — Encounter: Payer: Self-pay | Admitting: Family Medicine

## 2022-01-08 ENCOUNTER — Ambulatory Visit: Payer: BC Managed Care – PPO | Admitting: Family Medicine

## 2022-01-08 VITALS — BP 125/84 | HR 87 | Temp 98.7°F | Resp 16 | Wt 244.6 lb

## 2022-01-08 DIAGNOSIS — R9389 Abnormal findings on diagnostic imaging of other specified body structures: Secondary | ICD-10-CM

## 2022-01-08 DIAGNOSIS — M545 Low back pain, unspecified: Secondary | ICD-10-CM

## 2022-01-08 DIAGNOSIS — M47896 Other spondylosis, lumbar region: Secondary | ICD-10-CM | POA: Diagnosis not present

## 2022-01-08 DIAGNOSIS — S32030A Wedge compression fracture of third lumbar vertebra, initial encounter for closed fracture: Secondary | ICD-10-CM | POA: Diagnosis not present

## 2022-01-08 NOTE — Progress Notes (Signed)
Patient is  here to talk about his MRI that he got. Patient said that he was asked to get some lab  work to rule out cancer.

## 2022-01-08 NOTE — Progress Notes (Signed)
Established Patient Office Visit  Subjective    Patient ID: [name], male    DOB: 1971-01-29  Age: 51 y.o. MRN: [id number]  CC:  Chief Complaint  Patient presents with  . talk about MRI    HPI [name] presents for follow up of back pain and a recent MRI that indicated probable oncologic process in his back. His pain persists.    Outpatient Encounter Medications as of 01/08/2022  Medication Sig  . cetirizine (ZYRTEC ALLERGY) 10 MG tablet Take 1 tablet (10 mg total) by mouth daily.  . hydrochlorothiazide (HYDRODIURIL) 25 MG tablet Take 1 tablet (25 mg total) by mouth daily.  [name] ibuprofen (ADVIL) 600 MG tablet Take 1 tablet (600 mg total) by mouth every 6 (six) hours as needed.  [name] lisinopril (ZESTRIL) 40 MG tablet Take 1 tablet (40 mg total) by mouth daily.  . methocarbamol (ROBAXIN) 500 MG tablet Take 1 tablet (500 mg total) by mouth 2 (two) times daily as needed for muscle spasms.  . pantoprazole (PROTONIX) 40 MG tablet Take 1 tablet (40 mg total) by mouth daily.  . rosuvastatin (CRESTOR) 10 MG tablet Take 1 tablet (10 mg total) by mouth daily.  . traMADol (ULTRAM) 50 MG tablet Take 50 mg by mouth every 8 (eight) hours as needed.  . [DISCONTINUED] predniSONE (STERAPRED UNI-PAK 21 TAB) 10 MG (21) TBPK tablet Take by mouth daily. Take 6 tabs by mouth daily  for 2 days, then 5 tabs for 2 days, then 4 tabs for 2 days, then 3 tabs for 2 days, 2 tabs for 2 days, then 1 tab by mouth daily for 2 days   No facility-administered encounter medications on file as of 01/08/2022.    Past Medical History:  Diagnosis Date  . Allergy   . Atypical chest pain   . GERD (gastroesophageal reflux disease)   . Hepatic cyst    Benign by MRI  . Hiatal hernia   . History of colonic polyps   . Hyperlipidemia   . Hypertension   . Rectal bleeding   . Tobacco abuse     Past Surgical History:  Procedure Laterality Date  . COLONOSCOPY  01/10/2009     RMR: Normal rectum, normal colon;  repeat in 2015 due to [place] of colon cancer  . COLONOSCOPY N/A 01/09/2014   TFT:[id number] colonic polyps-removed as described  . COLONOSCOPY N/A 09/18/2016   Procedure: COLONOSCOPY;  Surgeon: Daneil Dolin, MD;  Location: [facility];  Service: Endoscopy;  Laterality: N/A;  730   . ESOPHAGOGASTRODUODENOSCOPY  10/04/2007   RMR: Distal esophageal erosions consistent with erosive reflux esophagitis, patulous gastroesophageal junction status post passage of a  Maloney dilator, 27 [place].  Otherwise, unremarkable esophagus.  Hiatal hernia.  Otherwise normal stomach.  Bulbar erosion  . ESOPHAGOGASTRODUODENOSCOPY N/A 01/09/2014   Erosive reflux esophagitis. Small hiatal hernia  . I & D EXTREMITY Left 07/26/2018   Procedure: IRRIGATION AND DEBRIDEMENT EXTREMITY;  Surgeon: Dayna Barker, MD;  Location: [place] [facility];  Service: Plastics;  Laterality: Left;  . PERCUTANEOUS PINNING Left 07/26/2018   Procedure: PERCUTANEOUS PINNING EXTREMITY;  Surgeon: Dayna Barker, MD;  Location: [place] [facility];  Service: Plastics;  Laterality: Left;    Family History  Problem Relation Age of Onset  . Heart disease Mother   . Hypertension Mother   . Diabetes Father   . Hypertension Father   . Colon cancer Sister        passed away from colon cancer, in  her 64s  . Heart attack Maternal Uncle   . Prostate cancer Maternal Uncle   . Diabetes Other   . Pancreatic cancer Neg Hx   . Rectal cancer Neg Hx   . Esophageal cancer Neg Hx   . Stomach cancer Neg Hx     Social History   Socioeconomic History  . Marital status: Single    Spouse name: Not on file  . Number of children: 2  . Years of education: Not on file  . Highest education level: Not on file  Occupational History  . Occupation: Scientist, physiological: [employer]    Comment: IT sales professional in [employer]  . Smoking status: Every Day    Packs/day: 0.25    Years: 10.00    Total pack years: 2.50    Types: Cigarettes  . Smokeless tobacco: Never   Vaping Use  . Vaping Use: Never used  Substance and Sexual Activity  . Alcohol use: Yes    Alcohol/week: 0.0 standard drinks of alcohol    Comment: occasional/wine on the weekends  . Drug use: No  . Sexual activity: Not on file  Other Topics Concern  . Not on file  Social History Narrative   Right handed   Lives alone one story home   Social Determinants of Health   Financial Resource Strain: Not on file  Food Insecurity: Not on file  Transportation Needs: Not on file  Physical Activity: Not on file  Stress: Not on file  Social Connections: Not on file  Intimate Partner Violence: Not on file    Review of Systems  All other systems reviewed and are negative.       Objective    BP 125/84   Pulse 87   Temp 98.7 F (37.1 C) (Oral)   Resp 16   Wt 244 lb 9.6 oz (110.9 kg)   SpO2 96%   BMI 36.12 kg/m   Physical Exam Vitals and nursing note reviewed.  Constitutional:      General: He is not in acute distress. Cardiovascular:     Rate and Rhythm: Normal rate and regular rhythm.  Pulmonary:     Effort: Pulmonary effort is normal.     Breath sounds: Normal breath sounds.  Abdominal:     Palpations: Abdomen is soft.     Tenderness: There is abdominal tenderness.  Musculoskeletal:     Lumbar back: Tenderness present. Decreased range of motion.  Neurological:     General: No focal deficit present.     Mental Status: He is alert and oriented to person, place, and time.        Assessment & Plan:   1. Abnormal MRI Will refer to oncology for further eval/mgt  2. Acute bilateral low back pain without sciatica As above    No follow-ups on file.   Becky Sax, MD

## 2022-01-09 ENCOUNTER — Telehealth: Payer: Self-pay | Admitting: Physician Assistant

## 2022-01-09 ENCOUNTER — Ambulatory Visit: Payer: BC Managed Care – PPO | Admitting: Physical Therapy

## 2022-01-09 NOTE — Telephone Encounter (Signed)
Scheduled appt per 8/16 referral. Pt is aware of appt date and time. Pt is aware to arrive 15 mins prior to appt time and to bring and updated insurance card. Pt is aware of appt location.   

## 2022-01-14 ENCOUNTER — Other Ambulatory Visit: Payer: Self-pay

## 2022-01-14 ENCOUNTER — Inpatient Hospital Stay: Payer: BC Managed Care – PPO

## 2022-01-14 ENCOUNTER — Encounter: Payer: Self-pay | Admitting: Physician Assistant

## 2022-01-14 ENCOUNTER — Inpatient Hospital Stay: Payer: BC Managed Care – PPO | Attending: Physician Assistant | Admitting: Physician Assistant

## 2022-01-14 VITALS — BP 149/81 | HR 90 | Temp 98.3°F | Resp 19 | Ht 69.0 in | Wt 246.2 lb

## 2022-01-14 DIAGNOSIS — Z8042 Family history of malignant neoplasm of prostate: Secondary | ICD-10-CM | POA: Diagnosis not present

## 2022-01-14 DIAGNOSIS — I10 Essential (primary) hypertension: Secondary | ICD-10-CM | POA: Insufficient documentation

## 2022-01-14 DIAGNOSIS — M899 Disorder of bone, unspecified: Secondary | ICD-10-CM

## 2022-01-14 DIAGNOSIS — R9389 Abnormal findings on diagnostic imaging of other specified body structures: Secondary | ICD-10-CM | POA: Diagnosis not present

## 2022-01-14 DIAGNOSIS — Z800 Family history of malignant neoplasm of digestive organs: Secondary | ICD-10-CM | POA: Diagnosis not present

## 2022-01-14 DIAGNOSIS — S32030A Wedge compression fracture of third lumbar vertebra, initial encounter for closed fracture: Secondary | ICD-10-CM | POA: Diagnosis not present

## 2022-01-14 DIAGNOSIS — Y999 Unspecified external cause status: Secondary | ICD-10-CM | POA: Diagnosis not present

## 2022-01-14 DIAGNOSIS — F17210 Nicotine dependence, cigarettes, uncomplicated: Secondary | ICD-10-CM | POA: Diagnosis not present

## 2022-01-14 LAB — CBC WITH DIFFERENTIAL (CANCER CENTER ONLY)
Abs Immature Granulocytes: 0.03 10*3/uL (ref 0.00–0.07)
Basophils Absolute: 0 10*3/uL (ref 0.0–0.1)
Basophils Relative: 0 %
Eosinophils Absolute: 0.1 10*3/uL (ref 0.0–0.5)
Eosinophils Relative: 1 %
HCT: 39.1 % (ref 39.0–52.0)
Hemoglobin: 13.3 g/dL (ref 13.0–17.0)
Immature Granulocytes: 0 %
Lymphocytes Relative: 43 %
Lymphs Abs: 3.2 10*3/uL (ref 0.7–4.0)
MCH: 29.4 pg (ref 26.0–34.0)
MCHC: 34 g/dL (ref 30.0–36.0)
MCV: 86.5 fL (ref 80.0–100.0)
Monocytes Absolute: 0.5 10*3/uL (ref 0.1–1.0)
Monocytes Relative: 6 %
Neutro Abs: 3.7 10*3/uL (ref 1.7–7.7)
Neutrophils Relative %: 50 %
Platelet Count: 233 10*3/uL (ref 150–400)
RBC: 4.52 MIL/uL (ref 4.22–5.81)
RDW: 13.1 % (ref 11.5–15.5)
WBC Count: 7.5 10*3/uL (ref 4.0–10.5)
nRBC: 0 % (ref 0.0–0.2)

## 2022-01-14 LAB — CMP (CANCER CENTER ONLY)
ALT: 22 U/L (ref 0–44)
AST: 21 U/L (ref 15–41)
Albumin: 3.7 g/dL (ref 3.5–5.0)
Alkaline Phosphatase: 56 U/L (ref 38–126)
Anion gap: 2 — ABNORMAL LOW (ref 5–15)
BUN: 13 mg/dL (ref 6–20)
CO2: 25 mmol/L (ref 22–32)
Calcium: 9.2 mg/dL (ref 8.9–10.3)
Chloride: 104 mmol/L (ref 98–111)
Creatinine: 0.91 mg/dL (ref 0.61–1.24)
GFR, Estimated: >60 mL/min (ref >60)
Glucose, Bld: 114 mg/dL — ABNORMAL HIGH (ref 70–99)
Potassium: 4.2 mmol/L (ref 3.5–5.1)
Sodium: 131 mmol/L — ABNORMAL LOW (ref 135–145)
Total Bilirubin: 0.3 mg/dL (ref 0.3–1.2)
Total Protein: 9.5 g/dL — ABNORMAL HIGH (ref 6.5–8.1)

## 2022-01-15 ENCOUNTER — Other Ambulatory Visit: Payer: Self-pay | Admitting: Physician Assistant

## 2022-01-15 DIAGNOSIS — R9389 Abnormal findings on diagnostic imaging of other specified body structures: Secondary | ICD-10-CM

## 2022-01-15 LAB — KAPPA/LAMBDA LIGHT CHAINS
Kappa free light chain: 92 mg/L — ABNORMAL HIGH (ref 3.3–19.4)
Kappa, lambda light chain ratio: 9.79 — ABNORMAL HIGH (ref 0.26–1.65)
Lambda free light chains: 9.4 mg/L (ref 5.7–26.3)

## 2022-01-15 LAB — PROSTATE-SPECIFIC AG, SERUM (LABCORP): Prostate Specific Ag, Serum: 1.8 ng/mL (ref 0.0–4.0)

## 2022-01-15 MED ORDER — TRAMADOL HCL 50 MG PO TABS: 50.0000 mg | ORAL_TABLET | Freq: Four times a day (QID) | ORAL | 0 refills | Status: DC | PRN

## 2022-01-15 NOTE — Progress Notes (Signed)
[place] [phone]   [phone]  INITIAL CONSULTATION:  Patient Care Team: [name], MD as PCP - General (Family Medicine) Lorretta Harp, MD as PCP - Cardiology (Cardiology) Gala Romney Cristopher Estimable, MD as Attending Physician (Gastroenterology)  CHIEF COMPLAINTS/PURPOSE OF CONSULTATION:  Abnormal MRI with diffuse marrow signal in thoracolumbar spine.   HISTORY OF PRESENTING ILLNESS:  [name] 51 y.o. male with medical history significant for GERD, hyperlipidemia, hypertension and hiatal hernia.  He presents to the diagnostic clinic for recent MRI findings and to rule out hematologic malignancy.  He is unaccompanied for this visit.  On review of the previous records, [name] initially presented to the ED on 01/04/2022 with acute low back pain.  MRI lumbar spine revealed diffuse heterogeneous marrow signal with heterogeneous enhancement throughout the thoracolumbar spine.  Additionally, there is subacute-chronic L3 vertebral body compression fraction.  On exam today, [name] reports persistent low back pain that he rates as 8 out of 10 on a pain scale.  He was given tramadol before which did improve his pain but his prescription ran out.  He is currently taking ibuprofen 600 mg with minimal relief.  Reports the pain radiates to his front of his abdomen.  He denies any lower extremity pain or neuropathy.  He denies any urinary symptoms or bowel incontinence.  He denies any energy changes or appetite loss.  He denies nausea, vomiting, diarrhea or constipation.  He denies easy bruising or signs of active bleeding.  He denies fevers, chills, night sweats, shortness of breath, chest pain or cough.  He has no other complaints.  Rest of the 10 point ROS is below.  MEDICAL HISTORY:  Past Medical History:  Diagnosis Date   Allergy    Atypical chest pain    GERD (gastroesophageal reflux disease)    Hepatic cyst    Benign  by MRI   Hiatal hernia    History of colonic polyps    Hyperlipidemia    Hypertension    Rectal bleeding    Tobacco abuse     SURGICAL HISTORY: Past Surgical History:  Procedure Laterality Date   COLONOSCOPY  01/10/2009     RMR: Normal rectum, normal colon; repeat in 2015 due to [place] of colon cancer   COLONOSCOPY N/A 01/09/2014   JEH:[id number] colonic polyps-removed as described   COLONOSCOPY N/A 09/18/2016   Procedure: COLONOSCOPY;  Surgeon: Daneil Dolin, MD;  [facility];  Service: Endoscopy;  Laterality: N/A;  730    ESOPHAGOGASTRODUODENOSCOPY  10/04/2007   RMR: Distal esophageal erosions consistent with erosive reflux esophagitis, patulous gastroesophageal junction status post passage of a  Maloney dilator, 60 [place].  Otherwise, unremarkable esophagus.  Hiatal hernia.  Otherwise normal stomach.  Bulbar erosion   ESOPHAGOGASTRODUODENOSCOPY N/A 01/09/2014   Erosive reflux esophagitis. Small hiatal hernia   I & D EXTREMITY Left 07/26/2018   Procedure: IRRIGATION AND DEBRIDEMENT EXTREMITY;  Surgeon: Dayna Barker, MD;  Location: [place];  Service: Plastics;  Laterality: Left;   PERCUTANEOUS PINNING Left 07/26/2018   Procedure: PERCUTANEOUS PINNING EXTREMITY;  Surgeon: Dayna Barker, MD;  Location: [facility];  Service: Plastics;  Laterality: Left;    SOCIAL HISTORY: Social History   Socioeconomic History   Marital status: Single    Spouse name: Not on file   Number of children: 2   Years of education: Not on file   Highest education level: Not on file  Occupational History   Occupation:  [employer]    Employer: [employer]    Comment: [place] in [employer]  Tobacco Use   Smoking status: Every Day    Packs/day: 0.25    Years: 10.00    Total pack years: 2.50    Types: Cigarettes   Smokeless tobacco: Never  Vaping Use   Vaping Use: Never used  Substance and Sexual Activity   Alcohol use: Yes    Alcohol/week: 0.0 standard drinks of alcohol    Comment:  occasional/wine on the weekends   Drug use: No   Sexual activity: Not on file  Other Topics Concern   Not on file  Social History Narrative   Right handed   Lives alone one story home   Social Determinants of Health   Financial Resource Strain: Not on file  Food Insecurity: Not on file  Transportation Needs: Not on file  Physical Activity: Not on file  Stress: Not on file  Social Connections: Not on file  Intimate Partner Violence: Not on file    FAMILY HISTORY: Family History  Problem Relation Age of Onset   Heart disease Mother    Hypertension Mother    Diabetes Father    Hypertension Father    Colon cancer Sister        passed away from colon cancer, in her 50s   Heart attack Maternal Uncle    Prostate cancer Maternal Uncle    Diabetes Other    Pancreatic cancer Neg Hx    Rectal cancer Neg Hx    Esophageal cancer Neg Hx    Stomach cancer Neg Hx     ALLERGIES:  has No Known Allergies.  MEDICATIONS:  Current Outpatient Medications  Medication Sig Dispense Refill   cetirizine (ZYRTEC ALLERGY) 10 MG tablet Take 1 tablet (10 mg total) by mouth daily. 30 tablet 0   hydrochlorothiazide (HYDRODIURIL) 25 MG tablet Take 1 tablet (25 mg total) by mouth daily. 90 tablet 1   ibuprofen (ADVIL) 600 MG tablet Take 1 tablet (600 mg total) by mouth every 6 (six) hours as needed. 30 tablet 0   lisinopril (ZESTRIL) 40 MG tablet Take 1 tablet (40 mg total) by mouth daily. 90 tablet 1   methocarbamol (ROBAXIN) 500 MG tablet Take 1 tablet (500 mg total) by mouth 2 (two) times daily as needed for muscle spasms. 20 tablet 2   pantoprazole (PROTONIX) 40 MG tablet Take 1 tablet (40 mg total) by mouth daily. 90 tablet 1   rosuvastatin (CRESTOR) 10 MG tablet Take 1 tablet (10 mg total) by mouth daily. 90 tablet 0   traMADol (ULTRAM) 50 MG tablet Take 50 mg by mouth every 8 (eight) hours as needed. (Patient not taking: Reported on 01/14/2022)     No current facility-administered medications  for this visit.    REVIEW OF SYSTEMS:   Constitutional: ( - ) fevers, ( - )  chills , ( - ) night sweats Eyes: ( - ) blurriness of vision, ( - ) double vision, ( - ) watery eyes Ears, nose, mouth, throat, and face: ( - ) mucositis, ( - ) sore throat Respiratory: ( - ) cough, ( - ) dyspnea, ( - ) wheezes Cardiovascular: ( - ) palpitation, ( - ) chest discomfort, ( - ) lower extremity swelling Gastrointestinal:  ( - ) nausea, ( - ) heartburn, ( - ) change in bowel habits Skin: ( - ) abnormal skin rashes Lymphatics: ( - ) new lymphadenopathy, ( - ) easy bruising Neurological: ( - )  numbness, ( - ) tingling, ( - ) new weaknesses Behavioral/Psych: ( - ) mood change, ( - ) new changes  All other systems were reviewed with the patient and are negative.  PHYSICAL EXAMINATION: ECOG PERFORMANCE STATUS: 1 - Symptomatic but completely ambulatory  Vitals:   01/14/22 1521  BP: (!) 149/81  Pulse: 90  Resp: 19  Temp: 98.3 F (36.8 C)  SpO2: 100%   Filed Weights   01/14/22 1521  Weight: 246 lb 3.2 oz (111.7 kg)    GENERAL: well appearing male in NAD  SKIN: skin color, texture, turgor are normal, no rashes or significant lesions EYES: conjunctiva are pink and non-injected, sclera clear OROPHARYNX: no exudate, no erythema; lips, buccal mucosa, and tongue normal  NECK: supple, non-tender LYMPH:  no palpable lymphadenopathy in the cervical or supraclavicular lymph nodes.  LUNGS: clear to auscultation and percussion with normal breathing effort HEART: regular rate & rhythm and no murmurs and no lower extremity edema ABDOMEN: soft, non-tender, non-distended, normal bowel sounds Musculoskeletal: no cyanosis of digits and no clubbing  PSYCH: alert & oriented x 3, fluent speech NEURO: no focal motor/sensory deficits  LABORATORY DATA:  I have reviewed the data as listed    Latest Ref Rng & Units 01/14/2022    4:05 PM 01/04/2022    4:50 PM 12/05/2021    3:56 PM  CBC  WBC 4.0 - 10.5 K/uL 7.5   8.5  8.7   Hemoglobin 13.0 - 17.0 g/dL 13.3  13.7  13.5   Hematocrit 39.0 - 52.0 % 39.1  41.3  40.6   Platelets 150 - 400 K/uL 233  221  238        Latest Ref Rng & Units 01/14/2022    4:05 PM 01/04/2022    4:50 PM 12/05/2021    3:56 PM  CMP  Glucose 70 - 99 mg/dL 114  100  91   BUN 6 - 20 mg/dL $Remove'13  18  13   'cDiwHaW$ Creatinine 0.61 - 1.24 mg/dL 0.91  1.14  1.16   Sodium 135 - 145 mmol/L 131  131  135   Potassium 3.5 - 5.1 mmol/L 4.2  5.2  4.4   Chloride 98 - 111 mmol/L 104  99  101   CO2 22 - 32 mmol/L $RemoveB'25  24  22   'EOQmJzoj$ Calcium 8.9 - 10.3 mg/dL 9.2  9.6  9.6   Total Protein 6.5 - 8.1 g/dL 9.5  10.2  9.0   Total Bilirubin 0.3 - 1.2 mg/dL 0.3  0.4  0.3   Alkaline Phos 38 - 126 U/L 56  53  69   AST 15 - 41 U/L 21  33  19   ALT 0 - 44 U/L $Remo'22  31  19      'NKXyj$ RADIOGRAPHIC STUDIES: I have personally reviewed the radiological images as listed and agreed with the findings in the report. MR Lumbar Spine W Wo Contrast  Result Date: 01/07/2022 CLINICAL DATA:  Low back pain radiating to the abdomen for 3 weeks. EXAM: MRI LUMBAR SPINE WITHOUT AND WITH CONTRAST TECHNIQUE: Multiplanar and multiecho pulse sequences of the lumbar spine were obtained without and with intravenous contrast. CONTRAST:  59mL MULTIHANCE GADOBENATE DIMEGLUMINE 529 MG/ML IV SOLN COMPARISON:  01/04/2022 FINDINGS: Segmentation:  Standard. Alignment:  Minimal grade 1 anterolisthesis of L4 on L5. Vertebrae: No acute fracture. Subacute-chronic L3 vertebral body compression fracture along the superior endplate with approximately 10% height loss, new compared with 07/21/2021. No discitis or osteomyelitis. Heterogeneous  marrow signal with heterogeneous enhancement throughout the thoracolumbar spine. Conus medullaris and cauda equina: Conus extends to the T12-L1 level. Conus and cauda equina appear normal. Paraspinal and other soft tissues: No acute paraspinal abnormality. Disc levels: Disc spaces: Minimal degenerative disease with disc height loss at  L4-5. T12-L1: No significant disc bulge. No neural foraminal stenosis. No central canal stenosis. L1-L2: No significant disc bulge. No neural foraminal stenosis. No central canal stenosis. L2-L3: Mild broad-based disc bulge. No foraminal or central canal stenosis. L3-L4: No disc protrusion. Mild bilateral facet arthropathy. Mild bilateral foraminal stenosis. No spinal stenosis. L4-L5: Mild broad-based disc bulge. Moderate bilateral facet arthropathy. Mild left and moderate right foraminal stenosis. No spinal stenosis. L5-S1: No significant disc bulge. No neural foraminal stenosis. No central canal stenosis. Mild bilateral facet arthropathy. IMPRESSION: 1. Diffuse heterogeneous marrow signal with heterogeneous enhancement throughout the thoracolumbar spine. The heterogeneous marrow is new compared with an MRI of the abdomen performed 05/27/2016. Overall appearance is concerning for an infiltrative marrow process such as myeloma or leukemia until proven otherwise. Correlate with laboratory values. 2. Subacute-chronic L3 vertebral body compression fracture with approximately 10% height loss. 3. Mild lumbar spine spondylosis as described above. Electronically Signed   By: Kathreen Devoid M.D.   On: 01/07/2022 14:37   MR LUMBAR SPINE WO CONTRAST  Result Date: 01/04/2022 CLINICAL DATA:  Back pain EXAM: MRI LUMBAR SPINE WITHOUT CONTRAST TECHNIQUE: Multiplanar, multisequence MR imaging of the lumbar spine was performed. No intravenous contrast was administered. COMPARISON:  X-ray 10/02/2020, CT 07/21/2021 FINDINGS: Segmentation: 5 lumbar type vertebral segments. An assimilation joint is present on the right at L5-S1. Alignment:  Minimal grade 1 anterolisthesis of L4 on L5. Vertebrae: Subacute or chronic subtle superior endplate compression fracture at L3 with faint residual marrow edema (series 6, image 10) is new from 07/21/2021. Approximately 10% vertebral body height loss centrally. No retropulsion. No additional  fractures. No evidence of discitis. Diffuse marrow heterogeneity without discrete marrow replacing lesion. Findings are nonspecific but can be seen in the setting of chronic anemia, smoking, and/or obesity. Conus medullaris and cauda equina: Conus extends to the T12-L1 level. Conus and cauda equina appear normal. Paraspinal and other soft tissues: Negative. Disc levels: T12-L1: Unremarkable. L1-L2: Unremarkable. L2-L3: Minimal annular disc bulge. Facet joints within normal limits. Mild right foraminal stenosis. No canal stenosis. L3-L4: No disc protrusion. Mild bilateral facet hypertrophy. Mild bilateral foraminal stenosis. No canal stenosis. L4-L5: Mild anterolisthesis with disc uncovering and mild diffuse disc bulge. Annular fissure is seen in the right foraminal zone. Moderate bilateral facet arthropathy. Moderate bilateral foraminal stenosis. No canal stenosis. L5-S1: Unremarkable disc. Mild-moderate bilateral facet arthropathy. No foraminal or canal stenosis. IMPRESSION: 1. Subacute or chronic subtle superior endplate compression fracture at L3 with faint residual marrow edema. This is new from 07/21/2021. 2. Mild-to-moderate lumbar spondylosis most pronounced at L4-L5 where there is moderate bilateral foraminal stenosis. No canal stenosis at any level. 3. Diffuse marrow heterogeneity without discrete marrow replacing lesion. Findings are nonspecific but can be seen in the setting of chronic anemia, smoking, and/or obesity. Myeloproliferative or lymphoproliferative processes not excluded. Electronically Signed   By: Davina Poke D.O.   On: 01/04/2022 16:08   XR Shoulder Left  Result Date: 12/20/2021 Moderate degenerative changes in the [facility] joint.  XR Cervical Spine 2 or 3 views  Result Date: 12/20/2021 X-rays demonstrate moderate multilevel degenerative changes with straightening of the cervical spine.   ASSESSMENT & PLAN AFNAN EMBERTON is a 51 y.o. male  who presents to the clinic for evaluation  of abnormal MRI lumbar spine concerning for hematologic malignancy.  We reviewed our recommended work-up which include serologic work-up to check CBC, CMP, SPEP with IFE, serum free light chains, PSA and 24-hour UPEP.  If there is evidence of monoclonal protein, we recommend undergoing a bone marrow biopsy.  #Diffuse heterogeneous marrow signal with heterogeneous enhancement throughout the thoracolumbar spine --Labs today to check  CBC, CMP, SPEP with IFE, serum free light chains, PSA and 24-hour UPEP. --Consider bone marrow biopsy based on above workup  # L3 vertebral body compression fracture/back pain: --We will make a referral for vertebroplasty --Sent refill for tramadol 50 mg q 6 hours as needed  Orders Placed This Encounter  Procedures   CBC with Differential ([place] Only)    Standing Status:   Future    Number of Occurrences:   1    Standing Expiration Date:   01/15/2023   CMP ([place] only)    Standing Status:   Future    Number of Occurrences:   1    Standing Expiration Date:   01/15/2023   Prostate-Specific AG, Serum    Standing Status:   Future    Number of Occurrences:   1    Standing Expiration Date:   01/15/2023   Multiple Myeloma Panel (SPEP&IFE w/QIG)    Standing Status:   Future    Number of Occurrences:   1    Standing Expiration Date:   01/14/2023   Kappa/lambda light chains    Standing Status:   Future    Number of Occurrences:   1    Standing Expiration Date:   01/14/2023   24-Hr Ur UPEP/UIFE/Light Chains/TP    Standing Status:   Future    Standing Expiration Date:   01/14/2023    All questions were answered. The patient knows to call the clinic with any problems, questions or concerns.  I have spent a total of 60 minutes minutes of face-to-face and non-face-to-face time, preparing to see the patient, obtaining and/or reviewing separately obtained history, performing a medically appropriate examination, counseling and educating the patient,  ordering medications/tests/procedures, referring and communicating with other health care professionals, documenting clinical information in the electronic health record, and care coordination.   Dede Query, PA-C Department of Hematology/Oncology [place] at [facility] Phone: [phone]  Patient was seen with Dr. Lorenso Courier  I have read the above note and personally examined the patient. I agree with the assessment and plan as noted above.  Briefly [name] is a 51 year old male who presents for evaluation of abnormal finding on MRI of the spine.  Patient has an L3 compression fracture and on the MRI of the spine was found to have abnormal marrow signal.  This abnormal marrow signaling could be concerning for underlying bone marrow disorder such as multiple myeloma.  Due to these findings he was referred to hematology for further evaluation and management.  At this time we will conduct a full multiple myeloma work-up to include SPEP, kappa lambda free light chains, UPEP.  Additionally we will order a PSA.  In the event findings are concerning for multiple myeloma we will need to pursue bone marrow biopsy as well as metastatic survey.  Patient voiced understanding of the plan moving forward.   Ledell Peoples, MD Department of Hematology/Oncology [place] at [facility] Phone: [phone] Pager: [phone] Email: [email or web address]

## 2022-01-17 ENCOUNTER — Other Ambulatory Visit: Payer: Self-pay | Admitting: *Deleted

## 2022-01-17 ENCOUNTER — Other Ambulatory Visit: Payer: Self-pay | Admitting: Physician Assistant

## 2022-01-17 DIAGNOSIS — Z800 Family history of malignant neoplasm of digestive organs: Secondary | ICD-10-CM | POA: Diagnosis not present

## 2022-01-17 DIAGNOSIS — S32030A Wedge compression fracture of third lumbar vertebra, initial encounter for closed fracture: Secondary | ICD-10-CM | POA: Diagnosis not present

## 2022-01-17 DIAGNOSIS — F17210 Nicotine dependence, cigarettes, uncomplicated: Secondary | ICD-10-CM | POA: Diagnosis not present

## 2022-01-17 DIAGNOSIS — Z8042 Family history of malignant neoplasm of prostate: Secondary | ICD-10-CM | POA: Diagnosis not present

## 2022-01-17 DIAGNOSIS — Y999 Unspecified external cause status: Secondary | ICD-10-CM | POA: Diagnosis not present

## 2022-01-17 DIAGNOSIS — I10 Essential (primary) hypertension: Secondary | ICD-10-CM | POA: Diagnosis not present

## 2022-01-17 DIAGNOSIS — R9389 Abnormal findings on diagnostic imaging of other specified body structures: Secondary | ICD-10-CM

## 2022-01-17 MED ORDER — OXYCODONE HCL 5 MG PO TABS: 5.0000 mg | ORAL_TABLET | Freq: Four times a day (QID) | ORAL | 0 refills | Status: DC | PRN

## 2022-01-20 ENCOUNTER — Telehealth: Payer: Self-pay | Admitting: Physician Assistant

## 2022-01-20 ENCOUNTER — Other Ambulatory Visit: Payer: Self-pay | Admitting: Physician Assistant

## 2022-01-20 DIAGNOSIS — D472 Monoclonal gammopathy: Secondary | ICD-10-CM

## 2022-01-20 LAB — MULTIPLE MYELOMA PANEL, SERUM
Albumin SerPl Elph-Mcnc: 3.8 g/dL (ref 2.9–4.4)
Albumin/Glob SerPl: 0.8 (ref 0.7–1.7)
Alpha 1: 0.2 g/dL (ref 0.0–0.4)
Alpha2 Glob SerPl Elph-Mcnc: 0.8 g/dL (ref 0.4–1.0)
B-Globulin SerPl Elph-Mcnc: 1 g/dL (ref 0.7–1.3)
Gamma Glob SerPl Elph-Mcnc: 3.4 g/dL — ABNORMAL HIGH (ref 0.4–1.8)
Globulin, Total: 5.4 g/dL — ABNORMAL HIGH (ref 2.2–3.9)
IgA: 87 mg/dL — ABNORMAL LOW (ref 90–386)
IgG (Immunoglobin G), Serum: 4996 mg/dL — ABNORMAL HIGH (ref 603–1613)
IgM (Immunoglobulin M), Srm: 62 mg/dL (ref 20–172)
M Protein SerPl Elph-Mcnc: 3.1 g/dL — ABNORMAL HIGH
Total Protein ELP: 9.2 g/dL — ABNORMAL HIGH (ref 6.0–8.5)

## 2022-01-20 NOTE — Telephone Encounter (Signed)
I called [name] to review the preliminary results from our consultation on 01/14/2022.  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  Kappa free light chain was elevated at 92.0, ratio was 9.79.  No evidence of cytopenias or renal dysfunction.  24-hour UPEP is pending.  Based on the above findings, recommend bone marrow biopsy to evaluate for underlying plasma cell neoplasm.  Additionally, we recommend bone mets survey to evaluate for lytic lesions.  Patient expressed understanding of the plan provided.

## 2022-01-20 NOTE — Telephone Encounter (Signed)
Error

## 2022-01-21 ENCOUNTER — Other Ambulatory Visit: Payer: Self-pay | Admitting: Physician Assistant

## 2022-01-21 ENCOUNTER — Ambulatory Visit: Payer: BC Managed Care – PPO | Admitting: Family Medicine

## 2022-01-21 ENCOUNTER — Other Ambulatory Visit: Payer: BC Managed Care – PPO

## 2022-01-21 ENCOUNTER — Inpatient Hospital Stay: Admission: RE | Admit: 2022-01-21 | Payer: BC Managed Care – PPO | Source: Ambulatory Visit

## 2022-01-21 DIAGNOSIS — D472 Monoclonal gammopathy: Secondary | ICD-10-CM

## 2022-01-21 LAB — UPEP/UIFE/LIGHT CHAINS/TP, 24-HR UR
% BETA, Urine: 24.9 %
ALPHA 1 URINE: 3.3 %
Albumin, U: 15.1 %
Alpha 2, Urine: 17.6 %
Free Kappa Lt Chains,Ur: 247.59 mg/L — ABNORMAL HIGH (ref 1.17–86.46)
Free Kappa/Lambda Ratio: 57.71 — ABNORMAL HIGH (ref 1.83–14.26)
Free Lambda Lt Chains,Ur: 4.29 mg/L (ref 0.27–15.21)
GAMMA GLOBULIN URINE: 39.3 %
M-SPIKE %, Urine: 21.1 % — ABNORMAL HIGH
M-Spike, Mg/24 Hr: 46 mg/24 hr — ABNORMAL HIGH
Total Protein, Urine-Ur/day: 218 mg/24 hr — ABNORMAL HIGH (ref 30–150)
Total Protein, Urine: 8.9 mg/dL
Total Volume: 2450

## 2022-01-22 ENCOUNTER — Ambulatory Visit (HOSPITAL_COMMUNITY)
Admission: RE | Admit: 2022-01-22 | Discharge: 2022-01-22 | Disposition: A | Discharge status: Home / Self Care | Payer: BC Managed Care – PPO | Source: Ambulatory Visit | Attending: Physician Assistant | Admitting: Physician Assistant

## 2022-01-22 DIAGNOSIS — D472 Monoclonal gammopathy: Secondary | ICD-10-CM | POA: Diagnosis not present

## 2022-01-29 ENCOUNTER — Ambulatory Visit
Admission: RE | Admit: 2022-01-29 | Discharge: 2022-01-29 | Disposition: A | Discharge status: Home / Self Care | Payer: BC Managed Care – PPO | Source: Ambulatory Visit | Attending: Physician Assistant | Admitting: Physician Assistant

## 2022-01-29 ENCOUNTER — Ambulatory Visit: Payer: BC Managed Care – PPO | Admitting: Family Medicine

## 2022-01-29 ENCOUNTER — Inpatient Hospital Stay: Admission: RE | Admit: 2022-01-29 | Payer: BC Managed Care – PPO | Source: Ambulatory Visit

## 2022-01-29 DIAGNOSIS — S32000A Wedge compression fracture of unspecified lumbar vertebra, initial encounter for closed fracture: Secondary | ICD-10-CM | POA: Diagnosis not present

## 2022-01-29 DIAGNOSIS — R9389 Abnormal findings on diagnostic imaging of other specified body structures: Secondary | ICD-10-CM

## 2022-01-31 ENCOUNTER — Telehealth: Payer: Self-pay | Admitting: Hematology and Oncology

## 2022-01-31 NOTE — Telephone Encounter (Signed)
Per 9/7 secure chat called and left message for pt about apopitnment

## 2022-02-04 ENCOUNTER — Emergency Department (HOSPITAL_COMMUNITY): Payer: BC Managed Care – PPO

## 2022-02-04 ENCOUNTER — Encounter (HOSPITAL_COMMUNITY): Payer: Self-pay | Admitting: Emergency Medicine

## 2022-02-04 ENCOUNTER — Emergency Department (HOSPITAL_COMMUNITY)
Admission: EM | Admit: 2022-02-04 | Discharge: 2022-02-04 | Disposition: A | Discharge status: Home / Self Care | Payer: BC Managed Care – PPO | Attending: Emergency Medicine | Admitting: Emergency Medicine

## 2022-02-04 DIAGNOSIS — Z79899 Other long term (current) drug therapy: Secondary | ICD-10-CM | POA: Insufficient documentation

## 2022-02-04 DIAGNOSIS — M79622 Pain in left upper arm: Secondary | ICD-10-CM | POA: Diagnosis not present

## 2022-02-04 DIAGNOSIS — M25512 Pain in left shoulder: Secondary | ICD-10-CM | POA: Diagnosis not present

## 2022-02-04 DIAGNOSIS — M79602 Pain in left arm: Secondary | ICD-10-CM | POA: Diagnosis not present

## 2022-02-04 MED ORDER — MIDAZOLAM HCL 2 MG/2ML IJ SOLN: 1.0000 mg | INTRAMUSCULAR | Status: DC | PRN

## 2022-02-04 MED ORDER — DOCUSATE SODIUM 100 MG PO CAPS: 100.0000 mg | ORAL_CAPSULE | Freq: Two times a day (BID) | ORAL | 0 refills | Status: DC

## 2022-02-04 MED ORDER — FENTANYL CITRATE PF 50 MCG/ML IJ SOSY: 25.0000 ug | PREFILLED_SYRINGE | INTRAMUSCULAR | Status: DC | PRN

## 2022-02-04 NOTE — ED Triage Notes (Signed)
Pt endorses left arm tingling and pain for awhile but worsened Sunday. Pain starts in shoulder and moves down, most pain in bicep area. Pt has compound fx in his back and procedure tomorrow. Pt denies CP or SOB.

## 2022-02-04 NOTE — Discharge Instructions (Signed)
Kyphoplasty Post Procedure Discharge Instructions  May resume a regular diet and any medications that you routinely take (including pain medications). However, if you are taking Aspirin or an anticoagulant/blood thinner you will be told when you can resume taking these by the healthcare provider. No driving day of procedure. The day of your procedure take it easy. You may use an ice pack as needed to injection sites on back.  Ice to back 30 minutes on and 30 minutes off, as needed. May remove bandaids tomorrow after taking a shower. Replace daily with a clean bandaid until healed.  Do not lift anything heavier than a milk jug for 1-2 weeks or determined by your physician.  Follow up with your physician in 2 weeks.    Please contact our office at [phone] for the following symptoms or if you have any questions:  Fever greater than 100 degrees Increased swelling, pain, or redness at injection site. Increased back and/or leg pain New numbness or change in symptoms from before the procedure.    Thank you for visiting [facility]. 

## 2022-02-04 NOTE — ED Provider Notes (Signed)
[facility] EMERGENCY DEPARTMENT Provider Note   CSN: [id number] Arrival date & time: 02/04/22  1034     History  Chief Complaint  Patient presents with   Arm Pain    [name] is a 51 y.o. male.  51 year old male with prior medical history as detailed below presents for evaluation.  Patient complains of pain in the left proximal upper arm.  Patient reports that this been ongoing issue for several days.  Patient is in the process of obtaining a work-up to diagnose possible multiple myeloma.  Patient denies any recent fall or injury to the arm.  Patient reports that his previously prescribed oxycodone is not providing adequate pain control.  The history is provided by the patient and medical records.       Home Medications Prior to Admission medications   Medication Sig Start Date End Date Taking? Authorizing Provider  cetirizine (ZYRTEC ALLERGY) 10 MG tablet Take 1 tablet (10 mg total) by mouth daily. Patient not taking: Reported on 01/29/2022 05/08/21   Jaynee Eagles, PA-C  hydrochlorothiazide (HYDRODIURIL) 25 MG tablet Take 1 tablet (25 mg total) by mouth daily. 12/05/21   [name], MD  ibuprofen (ADVIL) 600 MG tablet Take 1 tablet (600 mg total) by mouth every 6 (six) hours as needed. 01/04/22   Dion Saucier A, PA  lisinopril (ZESTRIL) 40 MG tablet Take 1 tablet (40 mg total) by mouth daily. 12/05/21   [name], MD  methocarbamol (ROBAXIN) 500 MG tablet Take 1 tablet (500 mg total) by mouth 2 (two) times daily as needed for muscle spasms. Patient not taking: Reported on 01/29/2022 12/18/21   Aundra Dubin, PA-C  oxyCODONE (OXY IR/ROXICODONE) 5 MG immediate release tablet Take 1-2 tablets (5-10 mg total) by mouth every 6 (six) hours as needed for severe pain. 01/17/22   Lincoln Brigham, PA-C  pantoprazole (PROTONIX) 40 MG tablet Take 1 tablet (40 mg total) by mouth daily. 12/05/21   [name], MD  rosuvastatin (CRESTOR) 10 MG tablet Take  1 tablet (10 mg total) by mouth daily. 12/21/21   [name], MD      Allergies    Patient has no known allergies.    Review of Systems   Review of Systems  All other systems reviewed and are negative.   Physical Exam Updated Vital Signs BP (!) 149/78 (BP Location: Right Arm)   Pulse (!) 102   Temp 98.4 F (36.9 C) (Oral)   Resp 18   Ht _0  (1.753 m)   Wt 111 kg   SpO2 95%   BMI 36.14 kg/m  Physical Exam Vitals and nursing note reviewed.  Constitutional:      General: He is not in acute distress.    Appearance: Normal appearance. He is well-developed.  HENT:     Head: Normocephalic and atraumatic.  Eyes:     Conjunctiva/sclera: Conjunctivae normal.     Pupils: Pupils are equal, round, and reactive to light.  Cardiovascular:     Rate and Rhythm: Normal rate and regular rhythm.     Heart sounds: Normal heart sounds.  Pulmonary:     Effort: Pulmonary effort is normal. No respiratory distress.     Breath sounds: Normal breath sounds.  Abdominal:     General: There is no distension.     Palpations: Abdomen is soft.     Tenderness: There is no abdominal tenderness.  Musculoskeletal:        General: No deformity. Normal  range of motion.     Cervical back: Normal range of motion and neck supple.  Skin:    General: Skin is warm and dry.  Neurological:     General: No focal deficit present.     Mental Status: He is alert and oriented to person, place, and time.     ED Results / Procedures / Treatments   Labs (all labs ordered are listed, but only abnormal results are displayed) Labs Reviewed - No data to display  EKG None  Radiology DG Humerus Left  Result Date: 02/04/2022 CLINICAL DATA:  Acute left arm pain without known injury. EXAM: LEFT HUMERUS - 2+ VIEW COMPARISON:  None Available. FINDINGS: No definite fracture is noted. However, there is seen a large lucency or lytic lesion seen involving the proximal left humeral shaft concerning for metastatic  disease or possibly myeloma. IMPRESSION: Probable large lytic lesion seen involving proximal left humeral shaft concerning for metastatic disease or possible myeloma. MRI is recommended for further evaluation. Electronically Signed   By: Marijo Conception M.D.   On: 02/04/2022 12:33   DG Shoulder Left  Result Date: 02/04/2022 CLINICAL DATA:  Left shoulder pain without known injury. EXAM: LEFT SHOULDER - 2+ VIEW COMPARISON:  December 18, 2021. FINDINGS: There is no evidence of fracture or dislocation. Joint spaces are intact. However, lucency is seen involving the medullary portion of the proximal left humeral shaft scalping of the adjacent cortices, concerning for neoplasm or malignancy. Soft tissues are unremarkable. IMPRESSION: Large lucency is seen in the medullary portion of the proximal left humeral shaft with scalloping of the adjacent cortices concerning for destructive neoplasm or metastatic disease. Multiple myeloma is a consideration. MRI is recommended for further evaluation. Electronically Signed   By: Marijo Conception M.D.   On: 02/04/2022 12:32    Procedures Procedures    Medications Ordered in ED Medications - No data to display  ED Course/ Medical Decision Making/ A&P                           Medical Decision Making Amount and/or Complexity of Data Reviewed Radiology: ordered.    Medical Screen Complete  This patient presented to the ED with complaint of left proximal arm pain.  This complaint involves an extensive number of treatment options. The initial differential diagnosis includes, but is not limited to, pain related to likely multiple myeloma  This presentation is: Acute, Chronic, Self-Limited, Previously Undiagnosed, Uncertain Prognosis, Complicated, Systemic Symptoms, and Threat to Life/Bodily Function  Patient is presenting with complaint of pain to the left proximal arm.  Patient with known lytic lesions to the left proximal humerus.  Patient is in the process of  work-up for likely multiple myeloma.  Patient is reporting that he is getting minimal pain relief with oxycodone as previously prescribed.  He reports taking 5 mg of oxycodone IR every 5-6 hours.  Patient has close follow-up already arranged with hematology oncology.  Patient is advised that he can increase his oxycodone dose to 2 2 tablets every 5-6 hours for better pain control.  Patient also advised that Colace, fiber, and MiraLAX may be required to help prevent constipation given his significant narcotic intake.    Imaging does not reveal new pathology in the left humerus.  No evidence of fracture on exam or imaging.  Patient reassured by ED evaluation.  He does understand need for close outpatient follow-up.  Additional history obtained:  External records  from outside sources obtained and reviewed including prior ED visits and prior Inpatient records.   Imaging Studies ordered:  I ordered imaging studies including plain films of the left shoulder and left humerus I independently visualized and interpreted obtained imaging which showed lytic lesions I agree with the radiologist interpretation.  Problem List / ED Course:  Pain to left shoulder   Reevaluation:  After the interventions noted above, I reevaluated the patient and found that they have: stayed the same   Disposition:  After consideration of the diagnostic results and the patients response to treatment, I feel that the patent would benefit from close outpatient follow-up.          Final Clinical Impression(s) / ED Diagnoses Final diagnoses:  Left arm pain    Rx / DC Orders ED Discharge Orders     None         Valarie Merino, MD 02/04/22 1303

## 2022-02-04 NOTE — Discharge Instructions (Addendum)
Return for any problem.  Use oxycodone as previously prescribed for pain control.  Follow up closely with your hematology / oncology team as instructed.  Use Fiber, Colace, MiraLAX as instructed to help prevent constipation.

## 2022-02-05 ENCOUNTER — Other Ambulatory Visit: Payer: Self-pay | Admitting: Physician Assistant

## 2022-02-05 ENCOUNTER — Ambulatory Visit
Admission: RE | Admit: 2022-02-05 | Discharge: 2022-02-05 | Disposition: A | Discharge status: Home / Self Care | Payer: BC Managed Care – PPO | Source: Ambulatory Visit | Attending: Physician Assistant | Admitting: Physician Assistant

## 2022-02-05 ENCOUNTER — Other Ambulatory Visit (HOSPITAL_COMMUNITY)
Admission: RE | Admit: 2022-02-05 | Discharge: 2022-02-05 | Disposition: A | Discharge status: Home / Self Care | Payer: BC Managed Care – PPO | Source: Ambulatory Visit | Attending: Interventional Radiology | Admitting: Interventional Radiology

## 2022-02-05 ENCOUNTER — Ambulatory Visit: Payer: BC Managed Care – PPO | Admitting: Family Medicine

## 2022-02-05 VITALS — BP 111/64 | HR 95 | Temp 98.4°F | Resp 17 | Wt 244.0 lb

## 2022-02-05 DIAGNOSIS — C9030 Solitary plasmacytoma not having achieved remission: Secondary | ICD-10-CM | POA: Insufficient documentation

## 2022-02-05 DIAGNOSIS — M8448XA Pathological fracture, other site, initial encounter for fracture: Secondary | ICD-10-CM | POA: Diagnosis not present

## 2022-02-05 DIAGNOSIS — R9389 Abnormal findings on diagnostic imaging of other specified body structures: Secondary | ICD-10-CM

## 2022-02-05 DIAGNOSIS — D472 Monoclonal gammopathy: Secondary | ICD-10-CM | POA: Diagnosis not present

## 2022-02-05 HISTORY — PX: IR BONE TUMOR(S)RF ABLATION: IMG2284

## 2022-02-05 HISTORY — PX: IR KYPHO LUMBAR INC FX REDUCE BONE BX UNI/BIL CANNULATION INC/IMAGING: IMG5519

## 2022-02-05 MED ORDER — CEFAZOLIN SODIUM-DEXTROSE 2-4 GM/100ML-% IV SOLN
2.0000 g | INTRAVENOUS | Status: AC
  Administered 2022-02-05: 2 g via INTRAVENOUS

## 2022-02-05 MED ORDER — KETOROLAC TROMETHAMINE 30 MG/ML IJ SOLN
30.0000 mg | Freq: Once | INTRAMUSCULAR | Status: AC
  Administered 2022-02-05: 30 mg via INTRAVENOUS

## 2022-02-05 MED ORDER — SODIUM CHLORIDE 0.9 % IV SOLN: INTRAVENOUS | Status: DC

## 2022-02-05 MED ORDER — FENTANYL CITRATE PF 50 MCG/ML IJ SOSY
25.0000 ug | PREFILLED_SYRINGE | INTRAMUSCULAR | Status: DC | PRN
  Administered 2022-02-05 (×4): 50 ug via INTRAVENOUS

## 2022-02-05 MED ORDER — MIDAZOLAM HCL 2 MG/2ML IJ SOLN
1.0000 mg | INTRAMUSCULAR | Status: DC | PRN
  Administered 2022-02-05: 1 mg via INTRAVENOUS
  Administered 2022-02-05: 2 mg via INTRAVENOUS
  Administered 2022-02-05 (×2): 1 mg via INTRAVENOUS

## 2022-02-05 NOTE — Progress Notes (Addendum)
Pt back in nursing recovery area. Pt still drowsy from procedure but will wake up when spoken to. Pt follows commands, talks in complete sentences and has no complaints at this time. Pt will remain in nursing station until discharged until discharged by radiologist.

## 2022-02-07 ENCOUNTER — Other Ambulatory Visit: Payer: Self-pay | Admitting: Internal Medicine

## 2022-02-07 DIAGNOSIS — D472 Monoclonal gammopathy: Secondary | ICD-10-CM

## 2022-02-10 ENCOUNTER — Ambulatory Visit (HOSPITAL_COMMUNITY)
Admission: RE | Admit: 2022-02-10 | Discharge: 2022-02-10 | Disposition: A | Discharge status: Home / Self Care | Payer: BC Managed Care – PPO | Source: Ambulatory Visit

## 2022-02-10 ENCOUNTER — Other Ambulatory Visit: Payer: Self-pay

## 2022-02-10 ENCOUNTER — Encounter (HOSPITAL_COMMUNITY): Payer: Self-pay

## 2022-02-10 ENCOUNTER — Ambulatory Visit (HOSPITAL_COMMUNITY)
Admission: RE | Admit: 2022-02-10 | Discharge: 2022-02-10 | Disposition: A | Discharge status: Home / Self Care | Payer: BC Managed Care – PPO | Source: Ambulatory Visit | Attending: Physician Assistant | Admitting: Physician Assistant

## 2022-02-10 DIAGNOSIS — D472 Monoclonal gammopathy: Secondary | ICD-10-CM

## 2022-02-10 DIAGNOSIS — C9000 Multiple myeloma not having achieved remission: Secondary | ICD-10-CM | POA: Insufficient documentation

## 2022-02-10 DIAGNOSIS — D649 Anemia, unspecified: Secondary | ICD-10-CM | POA: Insufficient documentation

## 2022-02-10 LAB — CBC WITH DIFFERENTIAL/PLATELET
Abs Immature Granulocytes: 0.06 10*3/uL (ref 0.00–0.07)
Basophils Absolute: 0 10*3/uL (ref 0.0–0.1)
Basophils Relative: 0 %
Eosinophils Absolute: 0.1 10*3/uL (ref 0.0–0.5)
Eosinophils Relative: 1 %
HCT: 39.2 % (ref 39.0–52.0)
Hemoglobin: 12.8 g/dL — ABNORMAL LOW (ref 13.0–17.0)
Immature Granulocytes: 1 %
Lymphocytes Relative: 40 %
Lymphs Abs: 3.1 10*3/uL (ref 0.7–4.0)
MCH: 29.4 pg (ref 26.0–34.0)
MCHC: 32.7 g/dL (ref 30.0–36.0)
MCV: 89.9 fL (ref 80.0–100.0)
Monocytes Absolute: 0.6 10*3/uL (ref 0.1–1.0)
Monocytes Relative: 8 %
Neutro Abs: 3.9 10*3/uL (ref 1.7–7.7)
Neutrophils Relative %: 50 %
Platelets: 196 10*3/uL (ref 150–400)
RBC: 4.36 MIL/uL (ref 4.22–5.81)
RDW: 12.8 % (ref 11.5–15.5)
WBC: 7.8 10*3/uL (ref 4.0–10.5)
nRBC: 0 % (ref 0.0–0.2)

## 2022-02-10 LAB — SURGICAL PATHOLOGY

## 2022-02-10 MED ORDER — FENTANYL CITRATE (PF) 100 MCG/2ML IJ SOLN
INTRAMUSCULAR | Status: AC | PRN
  Administered 2022-02-10 (×2): 50 ug via INTRAVENOUS

## 2022-02-10 MED ORDER — SODIUM CHLORIDE 0.9 % IV SOLN: INTRAVENOUS | Status: DC

## 2022-02-10 MED ORDER — FENTANYL CITRATE (PF) 100 MCG/2ML IJ SOLN
INTRAMUSCULAR | Status: AC
  Filled 2022-02-10: qty 2

## 2022-02-10 MED ORDER — LIDOCAINE HCL (PF) 1 % IJ SOLN
INTRAMUSCULAR | Status: AC | PRN
  Administered 2022-02-10: 20 mL

## 2022-02-10 NOTE — H&P (Signed)
Referring Physician(s): Dorsey,J  Supervising Physician: Jacqulynn Cadet  Patient Status:  [facility] OP  Chief Complaint:  "I'm here for a bone marrow biopsy"  Subjective: Patient known to IR service from L3 biopsy/radiofrequency ablation/kyphoplasty on 02/05/2022- path pending. He has a history of monoclonal gammopathy and presents again today for CT guided  bone marrow biopsy for further evaluation.  He currently denies fever, headache, chest pain, dyspnea, cough, nausea, vomiting or bleeding.  He does have left arm/shoulder discomfort, abdominal and back discomfort.  Past Medical History:  Diagnosis Date   Allergy    Atypical chest pain    GERD (gastroesophageal reflux disease)    Hepatic cyst    Benign by MRI   Hiatal hernia    History of colonic polyps    Hyperlipidemia    Hypertension    Rectal bleeding    Tobacco abuse    Past Surgical History:  Procedure Laterality Date   COLONOSCOPY  01/10/2009     RMR: Normal rectum, normal colon; repeat in 2015 due to [place] of colon cancer   COLONOSCOPY N/A 01/09/2014   HAL:[id number] colonic polyps-removed as described   COLONOSCOPY N/A 09/18/2016   Procedure: COLONOSCOPY;  Surgeon: Daneil Dolin, MD;  Location: [facility];  Service: Endoscopy;  Laterality: N/A;  730    ESOPHAGOGASTRODUODENOSCOPY  10/04/2007   RMR: Distal esophageal erosions consistent with erosive reflux esophagitis, patulous gastroesophageal junction status post passage of a  [place] dilator, 38 [place].  Otherwise, unremarkable esophagus.  Hiatal hernia.  Otherwise normal stomach.  Bulbar erosion   ESOPHAGOGASTRODUODENOSCOPY N/A 01/09/2014   Erosive reflux esophagitis. Small hiatal hernia   I & D EXTREMITY Left 07/26/2018   Procedure: IRRIGATION AND DEBRIDEMENT EXTREMITY;  Surgeon: Dayna Barker, MD;  Location: [place];  Service: Plastics;  Laterality: Left;   IR BONE TUMOR(S)RF ABLATION  02/05/2022   IR KYPHO LUMBAR INC FX REDUCE BONE BX UNI/BIL CANNULATION  INC/IMAGING  02/05/2022   PERCUTANEOUS PINNING Left 07/26/2018   Procedure: PERCUTANEOUS PINNING EXTREMITY;  Surgeon: Dayna Barker, MD;  Location: [place];  Service: Plastics;  Laterality: Left;      Allergies: Patient has no known allergies.  Medications: Prior to Admission medications   Medication Sig Start Date End Date Taking? Authorizing Provider  cetirizine (ZYRTEC ALLERGY) 10 MG tablet Take 1 tablet (10 mg total) by mouth daily. Patient not taking: Reported on 01/29/2022 05/08/21   Jaynee Eagles, PA-C  docusate sodium (COLACE) 100 MG capsule Take 1 capsule (100 mg total) by mouth every 12 (twelve) hours. 02/04/22   Valarie Merino, MD  hydrochlorothiazide (HYDRODIURIL) 25 MG tablet Take 1 tablet (25 mg total) by mouth daily. 12/05/21   [name], MD  ibuprofen (ADVIL) 600 MG tablet Take 1 tablet (600 mg total) by mouth every 6 (six) hours as needed. 01/04/22   Dion Saucier A, PA  lisinopril (ZESTRIL) 40 MG tablet Take 1 tablet (40 mg total) by mouth daily. 12/05/21   [name], MD  methocarbamol (ROBAXIN) 500 MG tablet Take 1 tablet (500 mg total) by mouth 2 (two) times daily as needed for muscle spasms. Patient not taking: Reported on 01/29/2022 12/18/21   Aundra Dubin, PA-C  oxyCODONE (OXY IR/ROXICODONE) 5 MG immediate release tablet Take 1-2 tablets (5-10 mg total) by mouth every 6 (six) hours as needed for severe pain. 01/17/22   Lincoln Brigham, PA-C  pantoprazole (PROTONIX) 40 MG tablet Take 1 tablet (40 mg total) by mouth daily. 12/05/21  [name], MD  rosuvastatin (CRESTOR) 10 MG tablet Take 1 tablet (10 mg total) by mouth daily. 12/21/21   [name], MD     Vital Signs: Vitals:   02/10/22 1002  BP: (!) 140/93  Pulse: (!) 102  Resp: 16  Temp: 98.6 F (37 C)  SpO2: 100%     Physical Exam awake, alert.  Chest clear to auscultation bilaterally.  Heart with regular rate and rhythm.  Abdomen soft, positive bowel sounds, some mild left-sided tenderness to  palpation.  No lower extremity edema.  Imaging: No results found.  Labs:  CBC: Recent Labs    07/21/21 1019 12/05/21 1556 01/04/22 1650 01/14/22 1605  WBC 8.0 8.7 8.5 7.5  HGB 14.1 13.5 13.7 13.3  HCT 44.3 40.6 41.3 39.1  PLT 236 238 221 233    COAGS: No results for input(s): "INR", "APTT" in the last 8760 hours.  BMP: Recent Labs    05/08/21 1939 07/21/21 1019 12/05/21 1556 01/04/22 1650 01/14/22 1605  NA 132* 133* 135 131* 131*  K 4.4 4.1 4.4 5.2* 4.2  CL 97* 101 101 99 104  CO2 $Re'27 23 22 24 25  'fNT$ GLUCOSE 110* 112* 91 100* 114*  BUN $Re'13 14 13 18 13  'rzl$ CALCIUM 9.1 9.4 9.6 9.6 9.2  CREATININE 1.15 1.05 1.16 1.14 0.91  GFRNONAA >60 >60  --  >60 >60    LIVER FUNCTION TESTS: Recent Labs    07/21/21 1019 12/05/21 1556 01/04/22 1650 01/14/22 1605  BILITOT 0.6 0.3 0.4 0.3  AST 36 19 33 21  ALT 33 $Remo'19 31 22  'tCKmR$ ALKPHOS 63 69 53 56  PROT 8.8* 9.0* 10.2* 9.5*  ALBUMIN 3.8 3.7* 3.4* 3.7    Assessment and Plan: Patient known to IR service from L3 biopsy/radiofrequency ablation/kyphoplasty on 02/05/2022- path pending. He has a history of monoclonal gammopathy and presents again today for CT guided bone marrow biopsy for further evaluation.  Risks and benefits of procedure was discussed with the patient including, but not limited to bleeding, infection, damage to adjacent structures or low yield requiring additional tests.  All of the questions were answered and there is agreement to proceed.  Consent signed and in chart. Pt ate breakfast this am therefore IV fentanyl and local anesthetic will be utilized only    Electronically Signed: D. Rowe Robert, PA-C 02/10/2022, 9:51 AM   I spent a total of 15 Minutes at the the patient's bedside AND on the patient's hospital floor or unit, greater than 50% of which was counseling/coordinating care for CT guided bone marrow biopsy

## 2022-02-10 NOTE — Sedation Documentation (Signed)
Pt ate breakfast. BM bx without sedation

## 2022-02-10 NOTE — Discharge Instructions (Signed)
Bone Marrow Aspiration and Bone Marrow Biopsy, Adult, Care After This sheet gives you information about how to care for yourself after your procedure. Your health care provider may also give you more specific instructions. If you have problems or questions, contact your health careprovider. What can I expect after the procedure? After the procedure, it is common to have: Mild pain and tenderness. Swelling. Bruising. Follow these instructions at home: Puncture site care Follow instructions from your health care provider about how to take care of the puncture site. Make sure you: Wash your hands with soap and water before and after you change your bandage (dressing). If soap and water are not available, use hand sanitizer. Change your dressing as told by your health care provider. Check your puncture site every day for signs of infection. Check for: More redness, swelling, or pain. Fluid or blood. Warmth. Pus or a bad smell.  Activity Return to your normal activities as told by your health care provider. Ask your health care provider what activities are safe for you. Do not lift anything that is heavier than 10 lb (4.5 kg), or the limit that you are told, until your health care provider says that it is safe. Do not drive for 24 hours if you were given a sedative during your procedure. General instructions Take over-the-counter and prescription medicines only as told by your health care provider. Do not take baths, swim, or use a hot tub until your health care provider approves. Ask your health care provider if you may take showers. You may only be allowed to take sponge baths. If directed, put ice on the affected area. To do this: Put ice in a plastic bag. Place a towel between your skin and the bag. Leave the ice on for 20 minutes, 2-3 times a day. Keep all follow-up visits as told by your health care provider. This is important.  Contact a health care provider if: Your pain is not  controlled with medicine. You have a fever. You have more redness, swelling, or pain around the puncture site. You have fluid or blood coming from the puncture site. Your puncture site feels warm to the touch. You have pus or a bad smell coming from the puncture site. Summary After the procedure, it is common to have mild pain, tenderness, swelling, and bruising. Follow instructions from your health care provider about how to take care of the puncture site and what activities are safe for you. Take over-the-counter and prescription medicines only as told by your health care provider. Contact a health care provider if you have any signs of infection, such as fluid or blood coming from the puncture site. This information is not intended to replace advice given to you by your health care provider. Make sure you discuss any questions you have with your healthcare provider. Document Revised: 09/28/2018 Document Reviewed: 09/28/2018 Elsevier Patient Education  [place].

## 2022-02-10 NOTE — Procedures (Signed)
Interventional Radiology Procedure Note  Procedure: CT guided aspirate and core biopsy of right iliac bone Complications: None Recommendations: - Bedrest supine x 1 hrs - Hydrocodone PRN  Pain - Follow biopsy results  Signed,  Virjean Boman K. Malisa Ruggiero, MD   

## 2022-02-12 ENCOUNTER — Other Ambulatory Visit: Payer: Self-pay | Admitting: Interventional Radiology

## 2022-02-12 ENCOUNTER — Telehealth: Payer: Self-pay

## 2022-02-12 DIAGNOSIS — S32030A Wedge compression fracture of third lumbar vertebra, initial encounter for closed fracture: Secondary | ICD-10-CM

## 2022-02-12 LAB — SURGICAL PATHOLOGY

## 2022-02-12 NOTE — Telephone Encounter (Signed)
Phone call to pt to follow up from his kyphoplasty/Osteocool on 02/05/22. Pt reports he can tell a "big difference" in his pain. Pt states his sharp pain is gone and just has some "tightness" and "soreness" around the procedure site. The patient stated he is taking ibuprofen occasionally and icing the area as needed. Pt denies any signs of infection, redness at the site, draining or fever. Pt has no complaints at this time and will be scheduled for a telephone follow up with Dr. Denna Haggard next week. Pt advised to call back if anything were to change or any concerns arise and we will arrange an in person appointment. Pt verbalized understanding.

## 2022-02-13 ENCOUNTER — Inpatient Hospital Stay: Payer: BC Managed Care – PPO

## 2022-02-13 ENCOUNTER — Inpatient Hospital Stay: Payer: BC Managed Care – PPO | Attending: Physician Assistant | Admitting: Hematology and Oncology

## 2022-02-13 ENCOUNTER — Other Ambulatory Visit: Payer: Self-pay | Admitting: *Deleted

## 2022-02-13 ENCOUNTER — Telehealth: Payer: Self-pay | Admitting: Pharmacy Technician

## 2022-02-13 ENCOUNTER — Other Ambulatory Visit (HOSPITAL_COMMUNITY): Payer: Self-pay

## 2022-02-13 ENCOUNTER — Other Ambulatory Visit: Payer: Self-pay

## 2022-02-13 ENCOUNTER — Telehealth: Payer: Self-pay | Admitting: Pharmacist

## 2022-02-13 VITALS — BP 152/87 | HR 100 | Temp 98.3°F | Resp 16 | Wt 246.7 lb

## 2022-02-13 DIAGNOSIS — C9000 Multiple myeloma not having achieved remission: Secondary | ICD-10-CM | POA: Insufficient documentation

## 2022-02-13 DIAGNOSIS — Z79899 Other long term (current) drug therapy: Secondary | ICD-10-CM | POA: Insufficient documentation

## 2022-02-13 DIAGNOSIS — D472 Monoclonal gammopathy: Secondary | ICD-10-CM

## 2022-02-13 DIAGNOSIS — Z7982 Long term (current) use of aspirin: Secondary | ICD-10-CM | POA: Insufficient documentation

## 2022-02-13 DIAGNOSIS — Z7961 Long term (current) use of immunomodulator: Secondary | ICD-10-CM | POA: Diagnosis not present

## 2022-02-13 DIAGNOSIS — F17210 Nicotine dependence, cigarettes, uncomplicated: Secondary | ICD-10-CM | POA: Insufficient documentation

## 2022-02-13 MED ORDER — OXYCODONE HCL 5 MG PO TABS: 5.0000 mg | ORAL_TABLET | ORAL | 0 refills | Status: DC | PRN

## 2022-02-13 MED ORDER — LENALIDOMIDE 25 MG PO CAPS: 25.0000 mg | ORAL_CAPSULE | Freq: Every day | ORAL | 0 refills | Status: DC

## 2022-02-13 NOTE — Telephone Encounter (Signed)
Oral Oncology Patient Advocate Encounter   Received notification that prior authorization for Lenalidomide is required.   PA submitted on 02/13/2022 Key [id number] Status is pending     [name], CPhT-Adv Oncology Pharmacy Patient [place] Direct Number: [phone]  Fax: [phone]

## 2022-02-13 NOTE — Progress Notes (Signed)
[place] [phone]   [phone]  PROGRESS NOTE  Patient Care Team: [name], MD as PCP - General (Family Medicine) Lorretta Harp, MD as PCP - Cardiology (Cardiology) Gala Romney Cristopher Estimable, MD as Attending Physician (Gastroenterology)  Hematological/Oncological History # IgG Kappa Multiple Myeloma, 1p32/1q21.  01/04/2022: MRI lumbar spine shows diffuse heterogeneous marrow signal with heterogeneous enhancement throughout the thoracolumbar spine.  Additionally, there is subacute-chronic L3 vertebral body compression fraction. 01/14/2022: establish care with Dr. Lorenso Courier  02/10/2022: bone marrow biopsy shows range of plasma cell involvement from 50% on the biopsy to 60% on aspirate smear slides and close to 90% on the clot section for an overall percentage estimated at approximately 80% overall. 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   Interval History:  Christopher GUIFFRE 51 y.o. male with medical history significant for newly diagnosed IgG kappa multiple myeloma who presents for a follow up visit. The patient's last visit was on 01/14/2022 at which time he established care. In the interim since the last visit he underwent a bone marrow biopsy and an L3 IR bone tumor ablation with biopsy which confirmed plasmacytoma with multiple myeloma.  On exam today [name] reports that his pain is under better control today and is currently a 3 or 4 out of 10.  He reports that he is taking ibuprofen and Tylenol but is agreeable to starting oxycodone in addition to this.  He is not having any difficulty with constipation.  He is using MiraLAX as needed.  He is not currently having any fevers, chills, sweats, nausea, vomiting or diarrhea.  He has not noticed any urinary issues.  A full 10 point ROS was otherwise negative.  The bulk of our discussion focused on the diagnosis and treatment moving forward.  The details of this plan  are noted below.  MEDICAL HISTORY:  Past Medical History:  Diagnosis Date   Allergy    Anemia    Atypical chest pain    Cancer (HCC)    Multiple myeloma with normocytic anemia   GERD (gastroesophageal reflux disease)    Hepatic cyst    Benign by MRI   Hiatal hernia    History of colonic polyps    Hyperlipidemia    Hypertension    Rectal bleeding    Tobacco abuse     SURGICAL HISTORY: Past Surgical History:  Procedure Laterality Date   COLONOSCOPY  01/10/2009     RMR: Normal rectum, normal colon; repeat in 2015 due to [place] of colon cancer   COLONOSCOPY N/A 01/09/2014   TIR:[id number] colonic polyps-removed as described   COLONOSCOPY N/A 09/18/2016   Procedure: COLONOSCOPY;  Surgeon: Daneil Dolin, MD;  [facility];  Service: Endoscopy;  Laterality: N/A;  730    ESOPHAGOGASTRODUODENOSCOPY  10/04/2007   RMR: Distal esophageal erosions consistent with erosive reflux esophagitis, patulous gastroesophageal junction status post passage of a  Maloney dilator, 62 [place].  Otherwise, unremarkable esophagus.  Hiatal hernia.  Otherwise normal stomach.  Bulbar erosion   ESOPHAGOGASTRODUODENOSCOPY N/A 01/09/2014   Erosive reflux esophagitis. Small hiatal hernia   HUMERUS IM NAIL Left 02/22/2022   Procedure: INTRAMEDULLARY (IM) NAIL HUMERAL;  Surgeon: Nicholes Stairs, MD;  Location: [facility];  Service: Orthopedics;  Laterality: Left;   I & D EXTREMITY Left 07/26/2018   Procedure: IRRIGATION AND DEBRIDEMENT EXTREMITY;  Surgeon: Dayna Barker, MD;  Location: [facility];  Service: Plastics;  Laterality: Left;   IR BONE TUMOR(S)RF ABLATION  02/05/2022   IR BONE TUMOR(S)RF ABLATION  02/24/2022   IR KYPHO EA ADDL LEVEL THORACIC OR LUMBAR  02/19/2022   IR KYPHO LUMBAR INC FX REDUCE BONE BX UNI/BIL CANNULATION INC/IMAGING  02/05/2022   IR KYPHO LUMBAR INC FX REDUCE BONE BX UNI/BIL CANNULATION INC/IMAGING  02/19/2022   PERCUTANEOUS PINNING Left 07/26/2018   Procedure: PERCUTANEOUS PINNING EXTREMITY;   Surgeon: Dayna Barker, MD;  Location: [facility];  Service: Plastics;  Laterality: Left;    SOCIAL HISTORY: Social History   Socioeconomic History   Marital status: Single    Spouse name: Not on file   Number of children: 2   Years of education: Not on file   Highest education level: Not on file  Occupational History   Occupation: Scientist, 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   Smoking status: Every Day    Packs/day: 0.25    Years: 10.00    Total pack years: 2.50    Types: Cigarettes   Smokeless tobacco: Never  Vaping Use   Vaping Use: Never used  Substance and Sexual Activity   Alcohol use: Yes    Alcohol/week: 0.0 standard drinks of alcohol    Comment: occasional/wine on the weekends   Drug use: No   Sexual activity: Not on file  Other Topics Concern   Not on file  Social History Narrative   Right handed   Lives alone one story home   Social Determinants of Health   Financial Resource Strain: Not on file  Food Insecurity: No Food Insecurity (02/18/2022)   Hunger Vital Sign    Worried About Running Out of Food in the Last Year: Never true    Ran Out of Food in the Last Year: Never true  Transportation Needs: No Transportation Needs (02/18/2022)   PRAPARE - Hydrologist (Medical): No    Lack of Transportation (Non-Medical): No  Physical Activity: Not on file  Stress: Not on file  Social Connections: Not on file  Intimate Partner Violence: Not At Risk (02/18/2022)   Humiliation, Afraid, Rape, and Kick questionnaire    Fear of Current or Ex-Partner: No    Emotionally Abused: No    Physically Abused: No    Sexually Abused: No    FAMILY HISTORY: Family History  Problem Relation Age of Onset   Heart disease Mother    Hypertension Mother    Diabetes Father    Hypertension Father    Colon cancer Sister        passed away from colon cancer, in her 18s   Heart attack Maternal Uncle     Prostate cancer Maternal Uncle    Diabetes Other    Pancreatic cancer Neg Hx    Rectal cancer Neg Hx    Esophageal cancer Neg Hx    Stomach cancer Neg Hx     ALLERGIES:  has No Known Allergies.  MEDICATIONS:  Current Outpatient Medications  Medication Sig Dispense Refill   acetaminophen (TYLENOL) 500 MG tablet Take 1 tablet (500 mg total) by mouth every 8 (eight) hours as needed for mild pain or moderate pain. 30 tablet 0   aspirin EC 81 MG tablet Take 81 mg by mouth daily. Swallow whole.     docusate sodium (COLACE) 100 MG capsule Take 1 capsule (100 mg total) by mouth 2 (two) times daily as needed for mild constipation. 25 capsule 0   hydrochlorothiazide (HYDRODIURIL) 25 MG  tablet Take 1 tablet (25 mg total) by mouth daily. 90 tablet 1   ibuprofen (ADVIL) 600 MG tablet Take 1 tablet (600 mg total) by mouth every 6 (six) hours as needed. (Patient taking differently: Take 600 mg by mouth every 6 (six) hours as needed for mild pain or moderate pain.) 30 tablet 0   lenalidomide (REVLIMID) 25 MG capsule Take 1 capsule (25 mg total) by mouth daily. Take for 14 days and then none for 7 days. Repeat every 21 days. [employer] Auth # [id number] Date Obtained 02/13/22 14 capsule 0   lisinopril (ZESTRIL) 40 MG tablet Take 1 tablet (40 mg total) by mouth daily. 90 tablet 1   metoprolol tartrate (LOPRESSOR) 50 MG tablet Take 1 tablet (50 mg total) by mouth 2 (two) times daily. 60 tablet 0   morphine (MS CONTIN) 30 MG 12 hr tablet Take 2 tablets (60 mg total) by mouth every 12 (twelve) hours. 8 tablet 0   oxyCODONE (ROXICODONE) 15 MG immediate release tablet Take 1 tablet (15 mg total) by mouth every 8 (eight) hours as needed for severe pain or breakthrough pain. 20 tablet 0   pantoprazole (PROTONIX) 40 MG tablet Take 1 tablet (40 mg total) by mouth daily. 90 tablet 1   polyethylene glycol powder (GLYCOLAX/MIRALAX) 17 GM/SCOOP powder Take 1 capful (17 g) with water by mouth daily. 238 g 0   No current  facility-administered medications for this visit.    REVIEW OF SYSTEMS:   Constitutional: ( - ) fevers, ( - )  chills , ( - ) night sweats Eyes: ( - ) blurriness of vision, ( - ) double vision, ( - ) watery eyes Ears, nose, mouth, throat, and face: ( - ) mucositis, ( - ) sore throat Respiratory: ( - ) cough, ( - ) dyspnea, ( - ) wheezes Cardiovascular: ( - ) palpitation, ( - ) chest discomfort, ( - ) lower extremity swelling Gastrointestinal:  ( - ) nausea, ( - ) heartburn, ( - ) change in bowel habits Skin: ( - ) abnormal skin rashes Lymphatics: ( - ) new lymphadenopathy, ( - ) easy bruising Neurological: ( - ) numbness, ( - ) tingling, ( - ) new weaknesses Behavioral/Psych: ( - ) mood change, ( - ) new changes  All other systems were reviewed with the patient and are negative.  PHYSICAL EXAMINATION: ECOG PERFORMANCE STATUS: 1 - Symptomatic but completely ambulatory  Vitals:   02/13/22 1114  BP: (!) 152/87  Pulse: 100  Resp: 16  Temp: 98.3 F (36.8 C)  SpO2: 100%   Filed Weights   02/13/22 1114  Weight: 246 lb 11.2 oz (111.9 kg)    GENERAL: Well-appearing young African-American male, alert, no distress and comfortable SKIN: skin color, texture, turgor are normal, no rashes or significant lesions EYES: conjunctiva are pink and non-injected, sclera clear LUNGS: clear to auscultation and percussion with normal breathing effort HEART: regular rate & rhythm and no murmurs and no lower extremity edema Musculoskeletal: no cyanosis of digits and no clubbing  PSYCH: alert & oriented x 3, fluent speech NEURO: no focal motor/sensory deficits  LABORATORY DATA:  I have reviewed the data as listed    Latest Ref Rng & Units 02/23/2022    5:40 AM 02/22/2022    6:40 AM 02/21/2022    6:43 AM  CBC  WBC 4.0 - 10.5 K/uL 8.1  5.6  6.0   Hemoglobin 13.0 - 17.0 g/dL 10.2  10.8  10.9   Hematocrit  39.0 - 52.0 % 30.7  32.4  32.8   Platelets 150 - 400 K/uL 210  203  204        Latest Ref  Rng & Units 02/24/2022    3:18 AM 02/23/2022    5:40 AM 02/22/2022    6:40 AM  CMP  Glucose 70 - 99 mg/dL 111  116  101   BUN 6 - 20 mg/dL _0 Creatinine 0.61 - 1.24 mg/dL 1.07  1.19  1.07   Sodium 135 - 145 mmol/L 131  131  133   Potassium 3.5 - 5.1 mmol/L 4.3  3.8  4.1   Chloride 98 - 111 mmol/L 96  100  100   CO2 22 - 32 mmol/L _1 Calcium 8.9 - 10.3 mg/dL 9.9  9.4  9.9     Lab Results  Component Value Date   MPROTEIN 3.1 (H) 01/14/2022   Lab Results  Component Value Date   KPAFRELGTCHN 92.0 (H) 01/14/2022   LAMBDASER 9.4 01/14/2022   KAPLAMBRATIO 57.71 (H) 01/17/2022   KAPLAMBRATIO 9.79 (H) 01/14/2022   RADIOGRAPHIC STUDIES: IR KYPHO LUMBAR INC FX REDUCE BONE BX UNI/BIL CANNULATION INC/IMAGING  Result Date: 02/24/2022 INDICATION: 51 year old male with known multiple myeloma and acute symptomatic pathologic fractures of the L1 and L2 vertebral bodies. EXAM: FLUOROSCOPY GUIDED L1 AND L2 RADIOFREQUENCY ABLATION AND BALLOON KYPHOPLASTY COMPARISON:  CT lumbar spine February 17, 2022. MEDICATIONS: As antibiotic prophylaxis, Ancef 2 g IV was ordered pre-procedure and administered intravenously within 1 hour of incision. All current medications are in the EMR and have been reviewed as part of this encounter. ANESTHESIA/SEDATION: Moderate (conscious) sedation was employed during this procedure. A total of Versed 6 mg, Dilaudid 1 mg and Fentanyl 200 mcg were administered intravenously by the radiology nurse. Total intra-service moderate Sedation Time: 1 hour and 47 minutes. The patient's level of consciousness and vital signs were monitored continuously by radiology nursing throughout the procedure under my direct supervision. FLUOROSCOPY: Radiation Exposure Index (as provided by the fluoroscopic device): 4,268 mGy Kerma COMPLICATIONS: None immediate. PROCEDURE: Following a full explanation of the procedure along with the potential associated complications, an informed  witnessed consent was obtained. The patient was placed in prone position on the angiography table. The lumbar region was prepped and draped in a sterile fashion. Under fluoroscopy, the L1 vertebral body was delineated and the skin area was marked. The skin was infiltrated with a 1% Lidocaine approximately 3.5 cm lateral to the spinous process projection on the right. Using a 22-gauge spinal needle, the soft issue and the peripedicular space and periosteum were infiltrated with Bupivacaine 0.5%. A skin incision was made at the access site. Subsequently, an 11-gauge Kyphon trocar was inserted under fluoroscopic guidance until contact with the pedicle was obtained. The trocar was inserted into the pedicle with light hammer tapping until the posterior boundaries of the vertebral body was reached. The diamond mandrill was removed and one core biopsy was obtained. The skin was infiltrated with a 1% Lidocaine approximately 3.5 cm lateral to the spinous process projection on the left. Using a 22-gauge spinal needle, the soft issue and the peripedicular space and periosteum were infiltrated with Bupivacaine 0.5%. A skin incision was made at the access site. Subsequently, an 11-gauge Kyphon trocar was inserted under fluoroscopic guidance until contact with the pedicle was obtained. The trocar was inserted with light hammer tapping into the pedicle until the posterior boundaries of the  vertebral body was reached. A bone drill was coaxially advanced within the anterior third of the vertebral body on each side for proper RF probes size selection. An OsteoCool RF probe was inserted bilaterally within the mid-posterior third of the vertebral body. The radiofrequency ablation cycle was performed. Thereafter, the RF probes were exchanged for inflatable Kyphon balloons, which were centered within the mid-aspect of the vertebral body. The balloons were inflated to create a void to serve as a repository for the bone cement. Both  balloons were deflated and through both cannulas, under continuous fluoroscopy guidance in the AP and lateral views, the vertebral body was filled with previously mixed polymethyl-methacrylate (PMMA) added to barium for opacification. Both cannulas were later removed. Under fluoroscopy, the L2 vertebral body was delineated and the skin area was marked. The skin was infiltrated with a 1% Lidocaine approximately 4 cm lateral to the spinous process projection on the right. Using a 22-gauge spinal needle, the soft issue and the peripedicular space and periosteum were infiltrated with Bupivacaine 0.5%. A skin incision was made at the access site. Subsequently, an 11-gauge Kyphon trocar was inserted under fluoroscopic guidance until contact with the pedicle was obtained. The trocar was inserted into the pedicle with light hammer tapping until the posterior boundaries of the vertebral body was reached. The diamond mandrill was removed. The skin was infiltrated with a 1% Lidocaine approximately 4 cm lateral to the spinous process projection on the left. Using a 22-gauge spinal needle, the soft issue and the peripedicular space and periosteum were infiltrated with Bupivacaine 0.5%. A skin incision was made at the access site. Subsequently, an 11-gauge Kyphon trocar was inserted under fluoroscopic guidance until contact with the pedicle was obtained. The trocar was inserted with light hammer tapping into the pedicle until the posterior boundaries of the vertebral body was reached. A bone drill was coaxially advanced within the anterior third of the vertebral body on each side for proper RF probes size selection. An OsteoCool RF probe was inserted bilaterally within the mid-posterior third of the vertebral body. The radiofrequency ablation cycle was performed. Thereafter, the RF probes were exchanged for inflatable Kyphon balloons, which were centered within the mid-aspect of the vertebral body. The balloons were inflated to  create a void to serve as a repository for the bone cement. Both balloons were deflated and through both cannulas, under continuous fluoroscopy guidance in the AP and lateral views, the vertebral body was filled with previously mixed polymethyl-methacrylate (PMMA) added to barium for opacification. Both cannulas were later removed. Postprocedural radiograph showed adequate cement distribution within the corresponding vertebral bodies without evidence of significant leakage or embolism. IMPRESSION: Successful fluoroscopic-guided L1 core biopsy and L1 and L2 radiofrequency ablation followed by kyphoplasty for treatment of symptomatic acute pathologic fractures. Biopsy sample obtained was sent for tissue exam. Electronically Signed   By: Pedro Earls M.D.   On: 02/24/2022 13:12   IR KYPHO EA ADDL LEVEL THORACIC OR LUMBAR  Result Date: 02/24/2022 INDICATION: 51 year old male with known multiple myeloma and acute symptomatic pathologic fractures of the L1 and L2 vertebral bodies. EXAM: FLUOROSCOPY GUIDED L1 AND L2 RADIOFREQUENCY ABLATION AND BALLOON KYPHOPLASTY COMPARISON:  CT lumbar spine February 17, 2022. MEDICATIONS: As antibiotic prophylaxis, Ancef 2 g IV was ordered pre-procedure and administered intravenously within 1 hour of incision. All current medications are in the EMR and have been reviewed as part of this encounter. ANESTHESIA/SEDATION: Moderate (conscious) sedation was employed during this procedure. A total of Versed 6  mg, Dilaudid 1 mg and Fentanyl 200 mcg were administered intravenously by the radiology nurse. Total intra-service moderate Sedation Time: 1 hour and 47 minutes. The patient's level of consciousness and vital signs were monitored continuously by radiology nursing throughout the procedure under my direct supervision. FLUOROSCOPY: Radiation Exposure Index (as provided by the fluoroscopic device): 4,742 mGy Kerma COMPLICATIONS: None immediate. PROCEDURE: Following a full  explanation of the procedure along with the potential associated complications, an informed witnessed consent was obtained. The patient was placed in prone position on the angiography table. The lumbar region was prepped and draped in a sterile fashion. Under fluoroscopy, the L1 vertebral body was delineated and the skin area was marked. The skin was infiltrated with a 1% Lidocaine approximately 3.5 cm lateral to the spinous process projection on the right. Using a 22-gauge spinal needle, the soft issue and the peripedicular space and periosteum were infiltrated with Bupivacaine 0.5%. A skin incision was made at the access site. Subsequently, an 11-gauge Kyphon trocar was inserted under fluoroscopic guidance until contact with the pedicle was obtained. The trocar was inserted into the pedicle with light hammer tapping until the posterior boundaries of the vertebral body was reached. The diamond mandrill was removed and one core biopsy was obtained. The skin was infiltrated with a 1% Lidocaine approximately 3.5 cm lateral to the spinous process projection on the left. Using a 22-gauge spinal needle, the soft issue and the peripedicular space and periosteum were infiltrated with Bupivacaine 0.5%. A skin incision was made at the access site. Subsequently, an 11-gauge Kyphon trocar was inserted under fluoroscopic guidance until contact with the pedicle was obtained. The trocar was inserted with light hammer tapping into the pedicle until the posterior boundaries of the vertebral body was reached. A bone drill was coaxially advanced within the anterior third of the vertebral body on each side for proper RF probes size selection. An OsteoCool RF probe was inserted bilaterally within the mid-posterior third of the vertebral body. The radiofrequency ablation cycle was performed. Thereafter, the RF probes were exchanged for inflatable Kyphon balloons, which were centered within the mid-aspect of the vertebral body. The  balloons were inflated to create a void to serve as a repository for the bone cement. Both balloons were deflated and through both cannulas, under continuous fluoroscopy guidance in the AP and lateral views, the vertebral body was filled with previously mixed polymethyl-methacrylate (PMMA) added to barium for opacification. Both cannulas were later removed. Under fluoroscopy, the L2 vertebral body was delineated and the skin area was marked. The skin was infiltrated with a 1% Lidocaine approximately 4 cm lateral to the spinous process projection on the right. Using a 22-gauge spinal needle, the soft issue and the peripedicular space and periosteum were infiltrated with Bupivacaine 0.5%. A skin incision was made at the access site. Subsequently, an 11-gauge Kyphon trocar was inserted under fluoroscopic guidance until contact with the pedicle was obtained. The trocar was inserted into the pedicle with light hammer tapping until the posterior boundaries of the vertebral body was reached. The diamond mandrill was removed. The skin was infiltrated with a 1% Lidocaine approximately 4 cm lateral to the spinous process projection on the left. Using a 22-gauge spinal needle, the soft issue and the peripedicular space and periosteum were infiltrated with Bupivacaine 0.5%. A skin incision was made at the access site. Subsequently, an 11-gauge Kyphon trocar was inserted under fluoroscopic guidance until contact with the pedicle was obtained. The trocar was inserted with light hammer tapping  into the pedicle until the posterior boundaries of the vertebral body was reached. A bone drill was coaxially advanced within the anterior third of the vertebral body on each side for proper RF probes size selection. An OsteoCool RF probe was inserted bilaterally within the mid-posterior third of the vertebral body. The radiofrequency ablation cycle was performed. Thereafter, the RF probes were exchanged for inflatable Kyphon balloons, which  were centered within the mid-aspect of the vertebral body. The balloons were inflated to create a void to serve as a repository for the bone cement. Both balloons were deflated and through both cannulas, under continuous fluoroscopy guidance in the AP and lateral views, the vertebral body was filled with previously mixed polymethyl-methacrylate (PMMA) added to barium for opacification. Both cannulas were later removed. Postprocedural radiograph showed adequate cement distribution within the corresponding vertebral bodies without evidence of significant leakage or embolism. IMPRESSION: Successful fluoroscopic-guided L1 core biopsy and L1 and L2 radiofrequency ablation followed by kyphoplasty for treatment of symptomatic acute pathologic fractures. Biopsy sample obtained was sent for tissue exam. Electronically Signed   By: Pedro Earls M.D.   On: 02/24/2022 13:12   IR Bone Tumor(s)RF Ablation  Result Date: 02/24/2022 INDICATION: 51 year old male with known multiple myeloma and acute symptomatic pathologic fractures of the L1 and L2 vertebral bodies. EXAM: FLUOROSCOPY GUIDED L1 AND L2 RADIOFREQUENCY ABLATION AND BALLOON KYPHOPLASTY COMPARISON:  CT lumbar spine February 17, 2022. MEDICATIONS: As antibiotic prophylaxis, Ancef 2 g IV was ordered pre-procedure and administered intravenously within 1 hour of incision. All current medications are in the EMR and have been reviewed as part of this encounter. ANESTHESIA/SEDATION: Moderate (conscious) sedation was employed during this procedure. A total of Versed 6 mg, Dilaudid 1 mg and Fentanyl 200 mcg were administered intravenously by the radiology nurse. Total intra-service moderate Sedation Time: 1 hour and 47 minutes. The patient's level of consciousness and vital signs were monitored continuously by radiology nursing throughout the procedure under my direct supervision. FLUOROSCOPY: Radiation Exposure Index (as provided by the fluoroscopic device):  6,237 mGy Kerma COMPLICATIONS: None immediate. PROCEDURE: Following a full explanation of the procedure along with the potential associated complications, an informed witnessed consent was obtained. The patient was placed in prone position on the angiography table. The lumbar region was prepped and draped in a sterile fashion. Under fluoroscopy, the L1 vertebral body was delineated and the skin area was marked. The skin was infiltrated with a 1% Lidocaine approximately 3.5 cm lateral to the spinous process projection on the right. Using a 22-gauge spinal needle, the soft issue and the peripedicular space and periosteum were infiltrated with Bupivacaine 0.5%. A skin incision was made at the access site. Subsequently, an 11-gauge Kyphon trocar was inserted under fluoroscopic guidance until contact with the pedicle was obtained. The trocar was inserted into the pedicle with light hammer tapping until the posterior boundaries of the vertebral body was reached. The diamond mandrill was removed and one core biopsy was obtained. The skin was infiltrated with a 1% Lidocaine approximately 3.5 cm lateral to the spinous process projection on the left. Using a 22-gauge spinal needle, the soft issue and the peripedicular space and periosteum were infiltrated with Bupivacaine 0.5%. A skin incision was made at the access site. Subsequently, an 11-gauge Kyphon trocar was inserted under fluoroscopic guidance until contact with the pedicle was obtained. The trocar was inserted with light hammer tapping into the pedicle until the posterior boundaries of the vertebral body was reached. A bone drill was  coaxially advanced within the anterior third of the vertebral body on each side for proper RF probes size selection. An OsteoCool RF probe was inserted bilaterally within the mid-posterior third of the vertebral body. The radiofrequency ablation cycle was performed. Thereafter, the RF probes were exchanged for inflatable Kyphon balloons,  which were centered within the mid-aspect of the vertebral body. The balloons were inflated to create a void to serve as a repository for the bone cement. Both balloons were deflated and through both cannulas, under continuous fluoroscopy guidance in the AP and lateral views, the vertebral body was filled with previously mixed polymethyl-methacrylate (PMMA) added to barium for opacification. Both cannulas were later removed. Under fluoroscopy, the L2 vertebral body was delineated and the skin area was marked. The skin was infiltrated with a 1% Lidocaine approximately 4 cm lateral to the spinous process projection on the right. Using a 22-gauge spinal needle, the soft issue and the peripedicular space and periosteum were infiltrated with Bupivacaine 0.5%. A skin incision was made at the access site. Subsequently, an 11-gauge Kyphon trocar was inserted under fluoroscopic guidance until contact with the pedicle was obtained. The trocar was inserted into the pedicle with light hammer tapping until the posterior boundaries of the vertebral body was reached. The diamond mandrill was removed. The skin was infiltrated with a 1% Lidocaine approximately 4 cm lateral to the spinous process projection on the left. Using a 22-gauge spinal needle, the soft issue and the peripedicular space and periosteum were infiltrated with Bupivacaine 0.5%. A skin incision was made at the access site. Subsequently, an 11-gauge Kyphon trocar was inserted under fluoroscopic guidance until contact with the pedicle was obtained. The trocar was inserted with light hammer tapping into the pedicle until the posterior boundaries of the vertebral body was reached. A bone drill was coaxially advanced within the anterior third of the vertebral body on each side for proper RF probes size selection. An OsteoCool RF probe was inserted bilaterally within the mid-posterior third of the vertebral body. The radiofrequency ablation cycle was performed.  Thereafter, the RF probes were exchanged for inflatable Kyphon balloons, which were centered within the mid-aspect of the vertebral body. The balloons were inflated to create a void to serve as a repository for the bone cement. Both balloons were deflated and through both cannulas, under continuous fluoroscopy guidance in the AP and lateral views, the vertebral body was filled with previously mixed polymethyl-methacrylate (PMMA) added to barium for opacification. Both cannulas were later removed. Postprocedural radiograph showed adequate cement distribution within the corresponding vertebral bodies without evidence of significant leakage or embolism. IMPRESSION: Successful fluoroscopic-guided L1 core biopsy and L1 and L2 radiofrequency ablation followed by kyphoplasty for treatment of symptomatic acute pathologic fractures. Biopsy sample obtained was sent for tissue exam. Electronically Signed   By: Pedro Earls M.D.   On: 02/24/2022 13:12   DG Humerus Left  Result Date: 02/22/2022 CLINICAL DATA:  Left humerus intramedullary nail. Intraoperative fluoroscopy. EXAM: LEFT HUMERUS - 2+ VIEW COMPARISON:  Left shoulder radiographs 02/17/2022 and 02/04/2022 FINDINGS: Images were performed intraoperatively without the presence of a radiologist. New intramedullary nail fixation of the previously seen pathologic fracture of the proximal humeral diaphysis. No hardware complication is seen. Total fluoroscopy images: 6 Total fluoroscopy time: 84 seconds Total dose: Radiation Exposure Index (as provided by the fluoroscopic device): 8.27 mGy air Kerma Please see intraoperative findings for further detail. IMPRESSION: Intraoperative fluoroscopy provided for intramedullary nail fixation of the known proximal humeral diaphysis pathologic  fracture. Electronically Signed   By: Yvonne Kendall M.D.   On: 02/22/2022 13:38   DG C-Arm 1-60 Min-No Report  Result Date: 02/22/2022 Fluoroscopy was utilized by the  requesting physician.  No radiographic interpretation.   DG C-Arm 1-60 Min-No Report  Result Date: 02/22/2022 Fluoroscopy was utilized by the requesting physician.  No radiographic interpretation.   DG Chest Port 1 View  Result Date: 02/20/2022 CLINICAL DATA:  Shortness of breath.  Fracture of left shoulder. EXAM: PORTABLE CHEST 1 VIEW COMPARISON:  02/17/2022 FINDINGS: Heart size and mediastinal contours are stable. Lungs are hypoinflated. Pulmonary vascular congestion. No pleural effusion or edema. No airspace disease. Pathologic fracture involving the proximal left humerus is noted. IMPRESSION: 1. Pulmonary vascular congestion. 2. Pathologic fracture involving the proximal left humerus. Electronically Signed   By: Kerby Moors M.D.   On: 02/20/2022 08:07   DG Shoulder Left  Result Date: 02/17/2022 CLINICAL DATA:  Fall EXAM: LEFT SHOULDER - 3 VIEW COMPARISON:  Shoulder radiograph dated February 04, 2022 FINDINGS: Oblique displaced fracture of the proximal left humeral shaft. Associated lucent lesion of the proximal left humerus is again seen. Associated soft tissue edema of the proximal left arm. Visualized lung is clear. IMPRESSION: Oblique displaced fracture of the proximal left humeral shaft with associated lucent lesion, findings are concerning for pathologic compression fracture. Electronically Signed   By: Yetta Glassman M.D.   On: 02/17/2022 13:13   DG Pelvis Portable  Result Date: 02/17/2022 CLINICAL DATA:  Fall. EXAM: PORTABLE PELVIS 1-2 VIEWS COMPARISON:  CT abdomen and pelvis 07/21/2021 FINDINGS: No acute fracture or pelvic diastasis is identified. There is mild left hip joint space narrowing. IMPRESSION: No acute osseous abnormality identified. Electronically Signed   By: Logan Bores M.D.   On: 02/17/2022 13:09   DG Chest Portable 1 View  Result Date: 02/17/2022 CLINICAL DATA:  Fall. EXAM: PORTABLE CHEST 1 VIEW COMPARISON:  Chest radiographs 05/04/2019 FINDINGS: The  cardiomediastinal silhouette is unchanged with normal heart size. Lung volumes are low with bronchovascular crowding. No confluent airspace opacity, overt pulmonary edema, sizable pleural effusion, or pneumothorax is identified. No acute osseous abnormality is seen. IMPRESSION: No active disease. Electronically Signed   By: Logan Bores M.D.   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 Low back pain. Status post recent L3 vertebral body biopsy, RF ablation, augmentation on 02/05/2022. Pathology revealed plasmacytoma. EXAM: CT LUMBAR SPINE WITHOUT CONTRAST TECHNIQUE: Multidetector CT imaging of the lumbar spine was performed without intravenous contrast administration. Multiplanar CT image reconstructions were also generated. RADIATION DOSE REDUCTION: This exam was performed according to the departmental dose-optimization program which includes automated exposure control, adjustment of the mA and/or kV according to patient size and/or use of iterative reconstruction technique. COMPARISON:  Lumbar MRI 01/07/2022. CT Abdomen and Pelvis 07/21/2021. FINDINGS: Segmentation: Normal. Alignment: Stable lumbar lordosis. Vertebrae: Pronounced heterogeneity of bone mineralization since February this year. Diffuse lytic vertebral and visible pelvic bone lesions. Mild pathologic compression fracture of L1. More moderate pathologic compression fracture of L2, with lucency of the anterior superior endplate and 14% central loss of vertebral body height compared to February. No retropulsion of bone. L2 pedicles and posterior elements appear intact. Augmented moderate to severe L3 pathologic compression fracture. No significant L4 vertebral compression. Subtle L5 pathologic vertebral compression. Visible sacral ala appear intact despite myelomas involvement. Paraspinal and other soft tissues: Negative visible noncontrast  abdominal viscera.  Lumbar paraspinal soft tissues remain within normal limits. Disc levels: Lumbar spine degeneration predominantly moderate to severe facet arthropathy at L4-L5 where there is subtle chronic grade 1 anterolisthesis. IMPRESSION: 1. Diffuse Multiple Myeloma involvement of the visible spine and pelvis, new since a February CT. 2. Augmented compression fracture of L3. Mild-to-moderate pathologic compression fracture of L2 (22% loss of vertebral body height, no complicating features). Mild pathologic compression fracture of L1. Electronically Signed   By: Genevie Ann M.D.   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 Low back pain. Status post recent L3 vertebral body biopsy, RF ablation, augmentation on 02/05/2022. Pathology revealed plasmacytoma. Neck pain. EXAM: CT CERVICAL SPINE WITHOUT CONTRAST TECHNIQUE: Multidetector CT imaging of the cervical spine was performed without intravenous contrast. Multiplanar CT image reconstructions were also generated. RADIATION DOSE REDUCTION: This exam was performed according to the departmental dose-optimization program which includes automated exposure control, adjustment of the mA and/or kV according to patient size and/or use of iterative reconstruction technique. COMPARISON:  Cervical spine radiographs 12/18/2021. FINDINGS: Alignment: Straightening of cervical lordosis. Cervicothoracic junction alignment is within normal limits. Bilateral posterior element alignment is within normal limits. Skull base and vertebrae: Visualized skull base is intact. No atlanto-occipital dissociation. C1 and C2 appear intact and aligned. Widely scattered ill-defined lucent lesions are present elsewhere in the cervical spine and visible upper thoracic spine C3 through T2. No cervical pathologic fracture is identified. Posterior element involvement is noted on the right  at C4 (series 4, image 44). No expansile cervical lesion at this time. Soft tissues and spinal canal: No prevertebral fluid or swelling. No visible canal hematoma. Negative noncontrast visible neck soft tissues. Disc levels: Widespread superimposed cervical spine disc and endplate degeneration. Multilevel degenerative cervical spinal stenosis, mild to moderate at C5-C6. Upper chest: T1 and T2 vertebral myeloma thus involvement, including an indistinct 10 mm lucent lesion of the left T1 body. Lung apices are clear. Negative visible noncontrast superior mediastinum. Other: Negative visible noncontrast posterior fossa. Visible paranasal sinuses, tympanic cavities and mastoids are well aerated. IMPRESSION: 1. Widespread cervical and visible upper thoracic vertebral involvement by Multiple Myeloma. No cervical pathologic fracture. 2. Superimposed bulky cervical spine degeneration. Subsequent mild to moderate degenerative spinal stenosis suspected at C5-C6. Electronically Signed   By: Genevie Ann M.D.   On: 02/17/2022 09:41   CT BONE MARROW BIOPSY & ASPIRATION  Result Date: 02/10/2022 INDICATION: 51 year old male with monoclonal gammopathy of uncertain significance and possible multiple myeloma. EXAM: CT GUIDED BONE MARROW ASPIRATION AND CORE BIOPSY Interventional Radiologist:  Criselda Peaches, MD MEDICATIONS: None. ANESTHESIA/SEDATION: None FLUOROSCOPY: None COMPLICATIONS: None immediate. Estimated blood loss: <25 mL PROCEDURE: Informed written consent was obtained from the patient after a thorough discussion of the procedural risks, benefits and alternatives. All questions were addressed. Maximal Sterile Barrier Technique was utilized including caps, mask, sterile gowns, sterile gloves, sterile drape, hand hygiene and skin antiseptic. A timeout was performed prior to the initiation of the procedure. The patient was positioned prone and non-contrast localization CT was performed of the pelvis to demonstrate the iliac  marrow spaces. Maximal barrier sterile technique utilized including caps, mask, sterile gowns, sterile gloves, large sterile drape, hand hygiene, and betadine prep. Under sterile conditions and local anesthesia, an 11 gauge coaxial bone biopsy needle was advanced into the right iliac marrow space. Needle position was confirmed with CT imaging. Initially, bone marrow aspiration was performed. Next,  the 11 gauge outer cannula was utilized to obtain a right iliac bone marrow core biopsy. Needle was removed. Hemostasis was obtained with compression. The patient tolerated the procedure well. Samples were prepared with the cytotechnologist. IMPRESSION: Successful right iliac bone marrow aspiration and core biopsy. Electronically Signed   By: Jacqulynn Cadet M.D.   On: 02/10/2022 15:18   IR KYPHO LUMBAR INC FX REDUCE BONE BX UNI/BIL CANNULATION INC/IMAGING  Result Date: 02/05/2022 INDICATION: Pathologic L3 vertebral body compression fracture, concern for multiple myeloma versus other hematologic malignancy EXAM: 1. L3 vertebral body biopsy using fluoroscopic guidance 2. L3 vertebral body radiofrequency ablation using OsteoCool 3. L3 vertebral body augmentation using balloon kyphoplasty COMPARISON:  MRI lumbar spine January 07, 2022 MEDICATIONS: As antibiotic prophylaxis, Ancef 2gm IV was ordered pre-procedure and administered intravenously within 1 hour of incision. All current medications are in the EMR and have been reviewed as part of this encounter. ANESTHESIA/SEDATION: Moderate (conscious) sedation was employed during this procedure. A total of Versed 5 mg and Fentanyl 200 mcg was administered intravenously by the radiology nurse. Total intra-service moderate Sedation Time: 64 minutes. The patient's level of consciousness and vital signs were monitored continuously by radiology nursing throughout the procedure under my direct supervision. FLUOROSCOPY: Radiation Exposure Index (as provided by the fluoroscopic  device): 5.9 minutes (98 mGy) COMPLICATIONS: None immediate. PROCEDURE: Informed written consent was obtained from the patient after a thorough discussion of the procedural risks, benefits and alternatives. All questions were addressed. Maximal Sterile Barrier Technique was utilized including caps, mask, sterile gowns, sterile gloves, sterile drape, hand hygiene and skin antiseptic. A timeout was performed prior to the initiation of the procedure. The patient was positioned prone on the exam table. A bipedicular access was planned. Skin entry site(s) were marked overlying the L3 vertebral body using fluoroscopy. The overlying skin was then prepped and draped in the standard sterile fashion. Local analgesia was obtained with 1% lidocaine. Attention was first turned to the right side. Under fluoroscopic guidance, a 10 gauge introducer needle was advanced towards the lateral margin of the pedicle. Using multiple projections, the introducer needle was advanced towards the posterior margin of the vertebral body via a transpedicular approach. The posterior margin of the ablation zone was then marked using the inner needle of the introducer. The inner needle was then removed, and a core needle biopsy of the vertebral body was performed using a 10 gauge core biopsy device. A small amount of tissue was retrieved and sent to the lab for analysis. Next, the marking drill was advanced towards the anterior margin of the vertebral body for RF probe selection. The 20 mm RF ablation probe was determined to be appropriate. Attention was then turned to the contralateral side. Under fluoroscopic guidance, a 10 gauge introducer needle was advanced towards the lateral margin of the pedicle. Using multiple projections, the introducer needle was advanced towards the posterior margin of the vertebral body via a transpedicular approach. Again, the posterior margin of the ablation zone was then marked using the inner needle of the  introducer. In a similar fashion, core needle biopsy of the vertebral body was performed using a 10 gauge core biopsy device. Small amount of tissue was again retrieved and sent to the lab for analysis. The marking drill was then advanced towards the anterior margin of the vertebral body for RF probe selection. The 20 mm RF ablation probe was determined to be appropriate. The selected RF ablation probes were then advanced through the bilateral transpedicular  access needles and locked into position. Appropriate location within the vertebral body was confirmed with multiple projections. RF ablation was then performed for 15 minutes at 70 degrees Celsius. The patient tolerated this portion of the procedure well without significant discomfort. At this time, the acrylic bone cement mixture was reconstituted in the Kyphon bone mixing device system. This was then loaded onto the Kyphon bone fillers. Upon completion of the RF ablation, the ablation probes were removed. The Kyphon 15 mm inflatable bone tamps were then advanced through the bilateral transpedicular access needles and positioned within the mid vertebral body. Kyphoplasty was then performed, ensuring that the balloon contours stayed within the vertebral body margins. The balloons were then deflated and removed, followed by advancement of the bone filler devices bilaterally and the instillation of acrylic bone cement with excellent filling in the AP and lateral projections. No extravasation was noted in the disk spaces or posteriorly into the spinal canal. No epidural venous contamination was seen. At the end of the procedure, the introducer cannulas and bone filler devices were then removed without difficulty. Clean dressings were placed after hemostasis. The patient tolerated all aspects of the procedure well, and was transferred to recovery in stable condition. IMPRESSION: 1. Successful core biopsy of the L3 vertebral body using fluoroscopic guidance. 2.  Successful radiofrequency ablation (OsteoCool) of the L3 vertebral body. 3. Successful L3 vertebral body augmentation using balloon kyphoplasty. If the patient has known osteoporosis, recommend treatment as clinically indicated. If the patient's bone density status is unknown, DEXA scan is recommended. Electronically Signed   By: Albin Felling M.D.   On: 02/05/2022 11:28   IR Bone Tumor(s)RF Ablation  Result Date: 02/05/2022 INDICATION: Pathologic L3 vertebral body compression fracture, concern for multiple myeloma versus other hematologic malignancy EXAM: 1. L3 vertebral body biopsy using fluoroscopic guidance 2. L3 vertebral body radiofrequency ablation using OsteoCool 3. L3 vertebral body augmentation using balloon kyphoplasty COMPARISON:  MRI lumbar spine January 07, 2022 MEDICATIONS: As antibiotic prophylaxis, Ancef 2gm IV was ordered pre-procedure and administered intravenously within 1 hour of incision. All current medications are in the EMR and have been reviewed as part of this encounter. ANESTHESIA/SEDATION: Moderate (conscious) sedation was employed during this procedure. A total of Versed 5 mg and Fentanyl 200 mcg was administered intravenously by the radiology nurse. Total intra-service moderate Sedation Time: 64 minutes. The patient's level of consciousness and vital signs were monitored continuously by radiology nursing throughout the procedure under my direct supervision. FLUOROSCOPY: Radiation Exposure Index (as provided by the fluoroscopic device): 5.9 minutes (98 mGy) COMPLICATIONS: None immediate. PROCEDURE: Informed written consent was obtained from the patient after a thorough discussion of the procedural risks, benefits and alternatives. All questions were addressed. Maximal Sterile Barrier Technique was utilized including caps, mask, sterile gowns, sterile gloves, sterile drape, hand hygiene and skin antiseptic. A timeout was performed prior to the initiation of the procedure. The patient  was positioned prone on the exam table. A bipedicular access was planned. Skin entry site(s) were marked overlying the L3 vertebral body using fluoroscopy. The overlying skin was then prepped and draped in the standard sterile fashion. Local analgesia was obtained with 1% lidocaine. Attention was first turned to the right side. Under fluoroscopic guidance, a 10 gauge introducer needle was advanced towards the lateral margin of the pedicle. Using multiple projections, the introducer needle was advanced towards the posterior margin of the vertebral body via a transpedicular approach. The posterior margin of the ablation zone was then marked using  the inner needle of the introducer. The inner needle was then removed, and a core needle biopsy of the vertebral body was performed using a 10 gauge core biopsy device. A small amount of tissue was retrieved and sent to the lab for analysis. Next, the marking drill was advanced towards the anterior margin of the vertebral body for RF probe selection. The 20 mm RF ablation probe was determined to be appropriate. Attention was then turned to the contralateral side. Under fluoroscopic guidance, a 10 gauge introducer needle was advanced towards the lateral margin of the pedicle. Using multiple projections, the introducer needle was advanced towards the posterior margin of the vertebral body via a transpedicular approach. Again, the posterior margin of the ablation zone was then marked using the inner needle of the introducer. In a similar fashion, core needle biopsy of the vertebral body was performed using a 10 gauge core biopsy device. Small amount of tissue was again retrieved and sent to the lab for analysis. The marking drill was then advanced towards the anterior margin of the vertebral body for RF probe selection. The 20 mm RF ablation probe was determined to be appropriate. The selected RF ablation probes were then advanced through the bilateral transpedicular access  needles and locked into position. Appropriate location within the vertebral body was confirmed with multiple projections. RF ablation was then performed for 15 minutes at 70 degrees Celsius. The patient tolerated this portion of the procedure well without significant discomfort. At this time, the acrylic bone cement mixture was reconstituted in the Kyphon bone mixing device system. This was then loaded onto the Kyphon bone fillers. Upon completion of the RF ablation, the ablation probes were removed. The Kyphon 15 mm inflatable bone tamps were then advanced through the bilateral transpedicular access needles and positioned within the mid vertebral body. Kyphoplasty was then performed, ensuring that the balloon contours stayed within the vertebral body margins. The balloons were then deflated and removed, followed by advancement of the bone filler devices bilaterally and the instillation of acrylic bone cement with excellent filling in the AP and lateral projections. No extravasation was noted in the disk spaces or posteriorly into the spinal canal. No epidural venous contamination was seen. At the end of the procedure, the introducer cannulas and bone filler devices were then removed without difficulty. Clean dressings were placed after hemostasis. The patient tolerated all aspects of the procedure well, and was transferred to recovery in stable condition. IMPRESSION: 1. Successful core biopsy of the L3 vertebral body using fluoroscopic guidance. 2. Successful radiofrequency ablation (OsteoCool) of the L3 vertebral body. 3. Successful L3 vertebral body augmentation using balloon kyphoplasty. If the patient has known osteoporosis, recommend treatment as clinically indicated. If the patient's bone density status is unknown, DEXA scan is recommended. Electronically Signed   By: Albin Felling M.D.   On: 02/05/2022 11:28   DG Humerus Left  Result Date: 02/04/2022 CLINICAL DATA:  Acute left arm pain without known  injury. EXAM: LEFT HUMERUS - 2+ VIEW COMPARISON:  None Available. FINDINGS: No definite fracture is noted. However, there is seen a large lucency or lytic lesion seen involving the proximal left humeral shaft concerning for metastatic disease or possibly myeloma. IMPRESSION: Probable large lytic lesion seen involving proximal left humeral shaft concerning for metastatic disease or possible myeloma. MRI is recommended for further evaluation. Electronically Signed   By: Marijo Conception M.D.   On: 02/04/2022 12:33   DG Shoulder Left  Result Date: 02/04/2022 CLINICAL DATA:  Left shoulder pain without known injury. EXAM: LEFT SHOULDER - 2+ VIEW COMPARISON:  December 18, 2021. FINDINGS: There is no evidence of fracture or dislocation. Joint spaces are intact. However, lucency is seen involving the medullary portion of the proximal left humeral shaft scalping of the adjacent cortices, concerning for neoplasm or malignancy. Soft tissues are unremarkable. IMPRESSION: Large lucency is seen in the medullary portion of the proximal left humeral shaft with scalloping of the adjacent cortices concerning for destructive neoplasm or metastatic disease. Multiple myeloma is a consideration. MRI is recommended for further evaluation. Electronically Signed   By: Marijo Conception M.D.   On: 02/04/2022 12:32   DG Radiologist Eval And Mgmt  Result Date: 01/30/2022 EXAMINATION: New patient consultation HISTORY: HISTORY OF PRESENT ILLNESS: Krebbs, Tymeer [name]; 51 y/o -old; M who presents with painful subacute lumbar compression fracture. He developed severe lumbar pain several weeks ago. This developed without discrete accident or inciting factor. No previous episodes. He rates his pain 10/10 on the visual analog pain scale at its worst, improved to 7/10 with current prescribed pain medications. His pain is nonradiating. Pain is most acute when sitting and standing, somewhat relieved when sitting and supine. No new bowel or bladder control  issues. No new lower extremity weakness or numbness. Medication: hydrochlorothiazide (HYDRODIURIL) 25 MG tablet Take 1 tablet (25 mg total) by mouth daily. ibuprofen (ADVIL) 600 MG tablet Take 1 tablet (600 mg total) by mouth every 6 (six) hours as needed. lisinopril (ZESTRIL) 40 MG tablet Take 1 tablet (40 mg total) by mouth daily. oxyCODONE (OXY IR/ROXICODONE) 5 MG immediate release tablet Take 1-2 tablets (5-10 mg total) by mouth every 6 (six) hours as needed for severe pain. pantoprazole (PROTONIX) 40 MG tablet Take 1 tablet (40 mg total) by mouth daily. rosuvastatin (CRESTOR) 10 MG tablet Take 1 tablet (10 mg total) by mouth daily. Allergies: None known Past Medical History: . Allergy . Atypical chest pain . GERD (gastroesophageal reflux disease) . Hepatic cyst Benign by MRI . Hiatal hernia . History of colonic polyps . Hyperlipidemia . Hypertension . Rectal bleeding . Tobacco abuse Surgical History: . COLONOSCOPY 01/10/2009 RMR: Normal rectum, normal colon; repeat in 2015 due to [place] of colon cancer . COLONOSCOPY N/A 01/09/2014 [id number] colonic polyps-removed as described . COLONOSCOPY N/A 09/18/2016 Procedure: COLONOSCOPY; Surgeon: Daneil Dolin, MD; [facility]; Service: Endoscopy; Laterality: N/A; 730 . ESOPHAGOGASTRODUODENOSCOPY 10/04/2007 RMR: Distal esophageal erosions consistent with erosive reflux esophagitis, patulous gastroesophageal junction status post passage of a Maloney dilator, 61 [place]. Otherwise, unremarkable esophagus. Hiatal hernia. Otherwise normal stomach. Bulbar erosion . ESOPHAGOGASTRODUODENOSCOPY N/A 01/09/2014 Erosive reflux esophagitis. Small hiatal hernia . I & D EXTREMITY Left 07/26/2018 Procedure: IRRIGATION AND DEBRIDEMENT EXTREMITY; Surgeon: Dayna Barker, MD; Location: [place]; Service: Plastics; Laterality: Left; . PERCUTANEOUS PINNING Left 07/26/2018 Procedure: PERCUTANEOUS PINNING EXTREMITY; Surgeon: Dayna Barker, MD; Location: [place]; Service: Plastics; Laterality:  Left; Family History: . Heart disease Mother . Hypertension Mother . Diabetes Father . Hypertension Father . Colon cancer Sister passed away from colon cancer, in her 17s . Heart attack Maternal Uncle . Prostate cancer Maternal Uncle . Diabetes Other . Pancreatic cancer Neg Hx . Rectal cancer Neg Hx . Esophageal cancer Neg Hx . Stomach cancer Neg Hx Social History: Socioeconomic History . Marital status: Single . Number of children: 2 Occupational History . Occupation: Estate agent: [employer] Comment: IT sales professional in [place] . Smoking status: Every Day Packs/day: 0.25 Years: 10.00 Total pack years:  2.50 Types: Cigarettes . Alcohol use: Yes Alcohol/week: 0.0 standard drinks of alcohol Comment: occasional/wine on the weekends . Drug use: No . Sexual activity: Not on file Other Topics Concern Right handed Lives alone one story home Advance Care: The advanced care plan/surrogate decision maker was discussed at the time of visit and the patient did not wish to discuss or was not able to name a surrogate decision maker or provide an advance care plan. REVIEW OF SYSTEMS: Constitutional Symptoms: Patient denies weight change, fever or chills at this time. Eyes: None Ears, Nose, Mouth, Throat: No auditory complaints, no sore throat, no sinusitis. Cardiovascular/Vascular: No known heart problems. Denies chest pain or palpitations. Respiratory: Patient denies cough or shortness of breath. GI: Patient denies abdominal pain, nausea, vomiting, diarrhea, bloody stools or constipation. GU: No change. Musculoskeletal: As above Integumentary (Skin): Patient denies rashes, ulcerations or skin color changes. Neuro: Patient denies decreased sensation, seizures or weakness. Psychological: None Hematological/Lymphatic: Patient denies easy bruising or bleeding. Allergy/Immune: None PHYSICAL EXAMINATION: Fall Risk Assessment: Ambulatory, displays no signs for fall risk using the CDC TUG assessment. Vital  Signs: BP 133/83 (BP Location: Right Arm, Patient Position: Sitting, Cuff Size: Normal)  Pulse 83  Temp 98.6 F (37 C) (Oral)  Wt 111.6 kg  SpO2 97% Comment: room air  BMI 36.33 kg/m HENT: Head: Normocephalic and atraumatic. Eyes: Conjunctivae and EOM are normal. Right eye exhibits no discharge. Left eye exhibits no discharge. No scleral icterus. Neck: No JVD present. Pulmonary/Chest: Effort normal. No stridor. No respiratory distress. Abdomen: soft, non distended Neurological:  alert and oriented to person, place, and time. Skin: Skin is warm and dry.  not diaphoretic. Psychiatric: normal mood and affect. behavior is normal. Judgment and thought content normal. BMI: 36.33. For patients, who provided height and weight, and are 18 or older with a BMI of 25.0 - 29.9 (overweight) or 30.0 or over (obesity), it is recommended the patient receive counseling regarding diet, nutrition and activity. IMAGING FINDINGS: MRI LUMBAR SPINE WITHOUT AND WITH CONTRAST 01/07/2022 IMPRESSION: 1. Diffuse heterogeneous marrow signal with heterogeneous enhancement throughout the thoracolumbar spine. The heterogeneous marrow is new compared with an MRI of the abdomen performed 05/27/2016. Overall appearance is concerning for an infiltrative marrow process such as myeloma or leukemia until proven otherwise. Correlate with laboratory values. 2. Subacute-chronic L3 vertebral body compression fracture with approximately 10% height loss. 3. Mild lumbar spine spondylosis as described above. ASSESSMENT: [place], [name]; 51 y/o -old; M seen in clinic. Patient has subacute lumbar L3 compression fracture deformity which likely contributes or accounts for most of the low back pain. Based on cross-sectional imaging, this would be anatomically approachable for percutaneous intervention. No associated spinal stenosis or other contraindications. Marrow heterogeneity suggesting infiltrative process resulting in pathologic fracture, therefore core  biopsy sample could be obtained at the time of procedure for further characterization. Given the lack of adequate symptom relief with time and a fairly aggressive pain medication regimen, and limitations of activities of daily living, the patient is clinically an appropriate candidate for consideration of vertebral augmentation. I discussed with the patient the pathophysiology of vertebral compression fracture deformities; the stable nature of these which does not require emergent treatment; natural history which includes healing over some unpredictable number of months. We discussed treatment options including watchful waiting, surgical fixation, and percutaneous kyphoplasty/vertebroplasty. We discussed in detail the percutaneous kyphoplasty technique, anticipated benefits, time course to symptom resolution, possible risks and side effects. We discussed his elevated risk of additional level  fractures with or without vertebral augmentation. We discussed the long-term need for continued bone building therapy managed by the patient's PCP. He is also scheduled for bone marrow biopsy due to blood work abnormality suggesting possible plasma cell neoplasm. The patient seemed to understand, and did ask appropriate questions. RECOMMENDATIONS: The patient is motivated to proceed with treatment ASAP. Accordingly, we schedule percutaneous lumbar L3 percutaneous bone biopsy, OsteoCool RF ablation, and kyphoplasty under moderate sedation as an outpatient at the patient's convenience, pending carrier approval if needed. SUMMARY: Approximately 30 minutes were spent with the patient, of which 20 minutes I spent discussing pathologic subacute painful L3 compression fracture treatment options. The patient has suffered a fracture of the lumbar L3 vertebral body, probably pathologic. It is recommended that patients aged 75 years or older be evaluated for possible testing or treatment of osteoporosis. A copy of this consult report is  sent to the patient's referring physician. Electronically Signed   By: Lucrezia Europe M.D.   On: 01/30/2022 08:48    ASSESSMENT & PLAN [name] 51 y.o. male with medical history significant for newly diagnosed IgG kappa multiple myeloma who presents for a follow up visit.  After review of the labs, review the records, discussion with the patient the findings are most consistent with newly diagnosed multiple myeloma.  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  At this time would recommend proceeding with VRD chemotherapy with the intention of referral for ASCT when his labs are appropriate.  Today we discussed the risks and benefits of VRD chemotherapy treatment and the plan moving forward.  The patient voices understanding and was willing and able to proceed.  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 -- Plan to proceed with VRD chemotherapy with intention of proceeding to transplant. -- Labs at each visit to include CBC, CMP, LDH with monthly restaging labs SPEP and serum free light chains -- We will start Zometa as soon as is feasible for bone protection.  Discussed this with patient.  He notes he will be getting dental eval as soon as possible -- Return to clinic for start of chemotherapy treatment on 02/26/2022.  #Pain Medication -- Patient having spinal pain, currently taking Tylenol and ibuprofen. -- Recommend oxycodone 5 mg every 6 hours as needed in addition to ibuprofen 600 mg every 6 hours as needed.  Additionally can take Tylenol 1000 mg every 8 hours as needed. -- Kyphoplasty to be performed by IR.  #Supportive Care -- chemotherapy education to be scheduled  -- port placement not required -- zofran 47m q8H PRN and compazine 140mPO q6H for nausea -- acyclovir 40012mO BID for VCZ  prophylaxis -- allopurinol 300m44m daily for TLS prophylaxis   Orders Placed This Encounter  Procedures   CBC with Differential ([facility])    Standing Status:   Future    Standing Expiration Date:   02/27/2023   CMP ([facility])    Standing Status:   Future    Standing Expiration Date:   02/27/2023   CBC with Differential ([facility])    Standing Status:   Future    Standing Expiration Date:   03/06/2023   CMP ([facility])    Standing Status:   Future    Standing Expiration Date:  03/06/2023   CBC with Differential ([place] [facility] Only)    Standing Status:   Future    Standing Expiration Date:   03/13/2023   CMP ([place] only)    Standing Status:   Future    Standing Expiration Date:   03/13/2023    All questions were answered. The patient knows to call the clinic with any problems, questions or concerns.  A total of more than 30 minutes were spent on this encounter with face-to-face time and non-face-to-face time, including preparing to see the patient, ordering tests and/or medications, counseling the patient and coordination of care as outlined above.   Christopher Peoples, MD Department of Hematology/Oncology [place] [facility] Phone: [phone] Pager: [phone] Email: [email or web address]  02/25/2022 5:49 PM

## 2022-02-13 NOTE — Progress Notes (Signed)
Revlimid enrollment completed with patient. Auth # obtained. Prescription given to [facility]

## 2022-02-14 ENCOUNTER — Other Ambulatory Visit (HOSPITAL_COMMUNITY): Payer: Self-pay

## 2022-02-14 MED ORDER — LENALIDOMIDE 25 MG PO CAPS: 25.0000 mg | ORAL_CAPSULE | Freq: Every day | ORAL | 0 refills | Status: DC

## 2022-02-14 NOTE — Telephone Encounter (Signed)
Oral Oncology Patient Advocate Encounter  Prior Authorization for Lenalidomide has been approved.    PA# [id number] Effective dates: 02/14/2022 through 02/14/2023  Patients co-pay is $0.    Lady Deutscher, CPhT-Adv Oncology Pharmacy Patient [place] Direct Number: [phone]  Fax: [phone]

## 2022-02-16 ENCOUNTER — Encounter: Payer: Self-pay | Admitting: Hematology and Oncology

## 2022-02-16 DIAGNOSIS — C9000 Multiple myeloma not having achieved remission: Secondary | ICD-10-CM | POA: Insufficient documentation

## 2022-02-16 NOTE — Progress Notes (Signed)
START ON PATHWAY REGIMEN - Multiple Myeloma and Other Plasma Cell Dyscrasias     A cycle is every 21 days:     Bortezomib      Lenalidomide      Dexamethasone   **Always confirm dose/schedule in your pharmacy ordering system**  Patient Characteristics: Multiple Myeloma, Newly Diagnosed, Transplant Eligible, Unknown or Awaiting Test Results Disease Classification: Multiple Myeloma R-ISS Staging: II Therapeutic Status: Newly Diagnosed Is Patient Eligible for Transplant<= Transplant Eligible Risk Status: Awaiting Test Results Intent of Therapy: Curative Intent, Discussed with Patient

## 2022-02-17 ENCOUNTER — Other Ambulatory Visit: Payer: Self-pay

## 2022-02-17 ENCOUNTER — Emergency Department (HOSPITAL_COMMUNITY): Payer: BC Managed Care – PPO

## 2022-02-17 ENCOUNTER — Inpatient Hospital Stay (HOSPITAL_COMMUNITY)
Admission: EM | Admit: 2022-02-17 | Discharge: 2022-02-24 | DRG: 478 | Disposition: A | Discharge status: Home Health | Payer: BC Managed Care – PPO | Attending: Internal Medicine | Admitting: Internal Medicine

## 2022-02-17 ENCOUNTER — Encounter (HOSPITAL_COMMUNITY): Payer: Self-pay | Admitting: Emergency Medicine

## 2022-02-17 ENCOUNTER — Encounter (HOSPITAL_COMMUNITY): Payer: Self-pay | Admitting: Hematology and Oncology

## 2022-02-17 DIAGNOSIS — M4856XA Collapsed vertebra, not elsewhere classified, lumbar region, initial encounter for fracture: Secondary | ICD-10-CM | POA: Insufficient documentation

## 2022-02-17 DIAGNOSIS — R0602 Shortness of breath: Secondary | ICD-10-CM | POA: Diagnosis not present

## 2022-02-17 DIAGNOSIS — M8440XA Pathological fracture, unspecified site, initial encounter for fracture: Secondary | ICD-10-CM | POA: Diagnosis not present

## 2022-02-17 DIAGNOSIS — Z86010 Personal history of colonic polyps: Secondary | ICD-10-CM | POA: Diagnosis not present

## 2022-02-17 DIAGNOSIS — S42332A Displaced oblique fracture of shaft of humerus, left arm, initial encounter for closed fracture: Secondary | ICD-10-CM

## 2022-02-17 DIAGNOSIS — M545 Low back pain, unspecified: Secondary | ICD-10-CM | POA: Diagnosis not present

## 2022-02-17 DIAGNOSIS — I10 Essential (primary) hypertension: Secondary | ICD-10-CM | POA: Diagnosis not present

## 2022-02-17 DIAGNOSIS — Y92003 Bedroom of unspecified non-institutional (private) residence as the place of occurrence of the external cause: Secondary | ICD-10-CM | POA: Diagnosis not present

## 2022-02-17 DIAGNOSIS — Z8249 Family history of ischemic heart disease and other diseases of the circulatory system: Secondary | ICD-10-CM | POA: Diagnosis not present

## 2022-02-17 DIAGNOSIS — Z79899 Other long term (current) drug therapy: Secondary | ICD-10-CM | POA: Diagnosis not present

## 2022-02-17 DIAGNOSIS — M542 Cervicalgia: Secondary | ICD-10-CM | POA: Diagnosis not present

## 2022-02-17 DIAGNOSIS — Z6836 Body mass index (BMI) 36.0-36.9, adult: Secondary | ICD-10-CM

## 2022-02-17 DIAGNOSIS — Z043 Encounter for examination and observation following other accident: Secondary | ICD-10-CM | POA: Diagnosis not present

## 2022-02-17 DIAGNOSIS — S0990XA Unspecified injury of head, initial encounter: Secondary | ICD-10-CM | POA: Diagnosis not present

## 2022-02-17 DIAGNOSIS — Z9889 Other specified postprocedural states: Secondary | ICD-10-CM

## 2022-02-17 DIAGNOSIS — S32010A Wedge compression fracture of first lumbar vertebra, initial encounter for closed fracture: Secondary | ICD-10-CM

## 2022-02-17 DIAGNOSIS — K2100 Gastro-esophageal reflux disease with esophagitis, without bleeding: Secondary | ICD-10-CM | POA: Diagnosis present

## 2022-02-17 DIAGNOSIS — R9431 Abnormal electrocardiogram [ECG] [EKG]: Secondary | ICD-10-CM | POA: Diagnosis not present

## 2022-02-17 DIAGNOSIS — C9030 Solitary plasmacytoma not having achieved remission: Secondary | ICD-10-CM | POA: Diagnosis not present

## 2022-02-17 DIAGNOSIS — W19XXXA Unspecified fall, initial encounter: Secondary | ICD-10-CM

## 2022-02-17 DIAGNOSIS — E669 Obesity, unspecified: Secondary | ICD-10-CM | POA: Diagnosis not present

## 2022-02-17 DIAGNOSIS — M549 Dorsalgia, unspecified: Secondary | ICD-10-CM | POA: Diagnosis not present

## 2022-02-17 DIAGNOSIS — E785 Hyperlipidemia, unspecified: Secondary | ICD-10-CM | POA: Diagnosis not present

## 2022-02-17 DIAGNOSIS — M84422A Pathological fracture, left humerus, initial encounter for fracture: Principal | ICD-10-CM | POA: Diagnosis present

## 2022-02-17 DIAGNOSIS — E860 Dehydration: Secondary | ICD-10-CM | POA: Diagnosis not present

## 2022-02-17 DIAGNOSIS — E871 Hypo-osmolality and hyponatremia: Secondary | ICD-10-CM | POA: Diagnosis present

## 2022-02-17 DIAGNOSIS — Y92009 Unspecified place in unspecified non-institutional (private) residence as the place of occurrence of the external cause: Secondary | ICD-10-CM | POA: Diagnosis not present

## 2022-02-17 DIAGNOSIS — S32020A Wedge compression fracture of second lumbar vertebra, initial encounter for closed fracture: Principal | ICD-10-CM

## 2022-02-17 DIAGNOSIS — G8918 Other acute postprocedural pain: Secondary | ICD-10-CM | POA: Diagnosis not present

## 2022-02-17 DIAGNOSIS — W1839XA Other fall on same level, initial encounter: Secondary | ICD-10-CM | POA: Diagnosis present

## 2022-02-17 DIAGNOSIS — E222 Syndrome of inappropriate secretion of antidiuretic hormone: Secondary | ICD-10-CM | POA: Diagnosis present

## 2022-02-17 DIAGNOSIS — D630 Anemia in neoplastic disease: Secondary | ICD-10-CM | POA: Diagnosis present

## 2022-02-17 DIAGNOSIS — M25519 Pain in unspecified shoulder: Secondary | ICD-10-CM | POA: Diagnosis not present

## 2022-02-17 DIAGNOSIS — F17210 Nicotine dependence, cigarettes, uncomplicated: Secondary | ICD-10-CM | POA: Diagnosis present

## 2022-02-17 DIAGNOSIS — S32030A Wedge compression fracture of third lumbar vertebra, initial encounter for closed fracture: Secondary | ICD-10-CM | POA: Diagnosis not present

## 2022-02-17 DIAGNOSIS — J811 Chronic pulmonary edema: Secondary | ICD-10-CM | POA: Diagnosis not present

## 2022-02-17 DIAGNOSIS — S32039A Unspecified fracture of third lumbar vertebra, initial encounter for closed fracture: Secondary | ICD-10-CM | POA: Diagnosis present

## 2022-02-17 DIAGNOSIS — D649 Anemia, unspecified: Secondary | ICD-10-CM | POA: Diagnosis not present

## 2022-02-17 DIAGNOSIS — R600 Localized edema: Secondary | ICD-10-CM | POA: Diagnosis not present

## 2022-02-17 DIAGNOSIS — S32000A Wedge compression fracture of unspecified lumbar vertebra, initial encounter for closed fracture: Secondary | ICD-10-CM | POA: Diagnosis present

## 2022-02-17 DIAGNOSIS — K219 Gastro-esophageal reflux disease without esophagitis: Secondary | ICD-10-CM | POA: Diagnosis not present

## 2022-02-17 DIAGNOSIS — C9000 Multiple myeloma not having achieved remission: Secondary | ICD-10-CM | POA: Diagnosis present

## 2022-02-17 DIAGNOSIS — R55 Syncope and collapse: Secondary | ICD-10-CM | POA: Diagnosis not present

## 2022-02-17 DIAGNOSIS — M84622A Pathological fracture in other disease, left humerus, initial encounter for fracture: Secondary | ICD-10-CM

## 2022-02-17 DIAGNOSIS — S42302A Unspecified fracture of shaft of humerus, left arm, initial encounter for closed fracture: Secondary | ICD-10-CM | POA: Diagnosis not present

## 2022-02-17 DIAGNOSIS — M8458XA Pathological fracture in neoplastic disease, other specified site, initial encounter for fracture: Secondary | ICD-10-CM | POA: Diagnosis not present

## 2022-02-17 HISTORY — DX: Anemia, unspecified: D64.9

## 2022-02-17 HISTORY — DX: Malignant (primary) neoplasm, unspecified: C80.1

## 2022-02-17 LAB — COMPREHENSIVE METABOLIC PANEL
ALT: 33 U/L (ref 0–44)
AST: 30 U/L (ref 15–41)
Albumin: 2.9 g/dL — ABNORMAL LOW (ref 3.5–5.0)
Alkaline Phosphatase: 47 U/L (ref 38–126)
Anion gap: 9 (ref 5–15)
BUN: 19 mg/dL (ref 6–20)
CO2: 21 mmol/L — ABNORMAL LOW (ref 22–32)
Calcium: 9.6 mg/dL (ref 8.9–10.3)
Chloride: 103 mmol/L (ref 98–111)
Creatinine, Ser: 1.17 mg/dL (ref 0.61–1.24)
GFR, Estimated: >60 mL/min (ref >60)
Glucose, Bld: 116 mg/dL — ABNORMAL HIGH (ref 70–99)
Potassium: 4 mmol/L (ref 3.5–5.1)
Sodium: 133 mmol/L — ABNORMAL LOW (ref 135–145)
Total Bilirubin: 0.5 mg/dL (ref 0.3–1.2)
Total Protein: 9.4 g/dL — ABNORMAL HIGH (ref 6.5–8.1)

## 2022-02-17 LAB — HEMOGLOBIN AND HEMATOCRIT, BLOOD
HCT: 35.9 % — ABNORMAL LOW (ref 39.0–52.0)
Hemoglobin: 11.6 g/dL — ABNORMAL LOW (ref 13.0–17.0)

## 2022-02-17 LAB — CBC WITH DIFFERENTIAL/PLATELET
Abs Immature Granulocytes: 0.1 10*3/uL — ABNORMAL HIGH (ref 0.00–0.07)
Basophils Absolute: 0 10*3/uL (ref 0.0–0.1)
Basophils Relative: 0 %
Eosinophils Absolute: 0 10*3/uL (ref 0.0–0.5)
Eosinophils Relative: 1 %
HCT: 34.7 % — ABNORMAL LOW (ref 39.0–52.0)
Hemoglobin: 11.3 g/dL — ABNORMAL LOW (ref 13.0–17.0)
Immature Granulocytes: 1 %
Lymphocytes Relative: 30 %
Lymphs Abs: 2.3 10*3/uL (ref 0.7–4.0)
MCH: 29.1 pg (ref 26.0–34.0)
MCHC: 32.6 g/dL (ref 30.0–36.0)
MCV: 89.4 fL (ref 80.0–100.0)
Monocytes Absolute: 0.5 10*3/uL (ref 0.1–1.0)
Monocytes Relative: 6 %
Neutro Abs: 4.7 10*3/uL (ref 1.7–7.7)
Neutrophils Relative %: 62 %
Platelets: 213 10*3/uL (ref 150–400)
RBC: 3.88 MIL/uL — ABNORMAL LOW (ref 4.22–5.81)
RDW: 13 % (ref 11.5–15.5)
WBC: 7.7 10*3/uL (ref 4.0–10.5)
nRBC: 0 % (ref 0.0–0.2)

## 2022-02-17 LAB — PROTIME-INR
INR: 1 (ref 0.8–1.2)
Prothrombin Time: 13.5 seconds (ref 11.4–15.2)

## 2022-02-17 LAB — HIV ANTIBODY (ROUTINE TESTING W REFLEX): HIV Screen 4th Generation wRfx: NONREACTIVE

## 2022-02-17 MED ORDER — OXYCODONE HCL 5 MG PO TABS: 5.0000 mg | ORAL_TABLET | ORAL | Status: DC | PRN

## 2022-02-17 MED ORDER — OXYCODONE HCL 5 MG PO TABS
5.0000 mg | ORAL_TABLET | ORAL | Status: DC | PRN
  Administered 2022-02-17 – 2022-02-18 (×3): 10 mg via ORAL
  Filled 2022-02-17 (×4): qty 2

## 2022-02-17 MED ORDER — PANTOPRAZOLE SODIUM 40 MG PO TBEC
40.0000 mg | DELAYED_RELEASE_TABLET | Freq: Every day | ORAL | Status: DC
  Administered 2022-02-18 – 2022-02-24 (×7): 40 mg via ORAL
  Filled 2022-02-17 (×7): qty 1

## 2022-02-17 MED ORDER — HYDRALAZINE HCL 20 MG/ML IJ SOLN: 10.0000 mg | INTRAMUSCULAR | Status: DC | PRN

## 2022-02-17 MED ORDER — ENOXAPARIN SODIUM 40 MG/0.4ML IJ SOSY
40.0000 mg | PREFILLED_SYRINGE | INTRAMUSCULAR | Status: DC
  Administered 2022-02-17 – 2022-02-23 (×5): 40 mg via SUBCUTANEOUS
  Filled 2022-02-17 (×5): qty 0.4

## 2022-02-17 MED ORDER — ACETAMINOPHEN 650 MG RE SUPP: 650.0000 mg | Freq: Four times a day (QID) | RECTAL | Status: DC | PRN

## 2022-02-17 MED ORDER — POLYETHYLENE GLYCOL 3350 17 G PO PACK
17.0000 g | PACK | Freq: Every day | ORAL | Status: DC
  Administered 2022-02-18 – 2022-02-23 (×3): 17 g via ORAL
  Filled 2022-02-17 (×5): qty 1

## 2022-02-17 MED ORDER — FENTANYL CITRATE PF 50 MCG/ML IJ SOSY
50.0000 ug | PREFILLED_SYRINGE | Freq: Once | INTRAMUSCULAR | Status: AC
  Administered 2022-02-17: 50 ug via INTRAVENOUS
  Filled 2022-02-17: qty 1

## 2022-02-17 MED ORDER — THIAMINE MONONITRATE 100 MG PO TABS: 100.0000 mg | ORAL_TABLET | Freq: Every day | ORAL | Status: DC

## 2022-02-17 MED ORDER — ACETAMINOPHEN 325 MG PO TABS
650.0000 mg | ORAL_TABLET | Freq: Four times a day (QID) | ORAL | Status: DC | PRN
  Administered 2022-02-17: 650 mg via ORAL
  Filled 2022-02-17: qty 2

## 2022-02-17 MED ORDER — LISINOPRIL 20 MG PO TABS
40.0000 mg | ORAL_TABLET | Freq: Every day | ORAL | Status: DC
  Administered 2022-02-18 – 2022-02-22 (×5): 40 mg via ORAL
  Filled 2022-02-17 (×5): qty 2

## 2022-02-17 MED ORDER — LORAZEPAM 1 MG PO TABS: 1.0000 mg | ORAL_TABLET | ORAL | Status: DC | PRN

## 2022-02-17 MED ORDER — LORAZEPAM 2 MG/ML IJ SOLN: 1.0000 mg | INTRAMUSCULAR | Status: DC | PRN

## 2022-02-17 MED ORDER — THIAMINE HCL 100 MG/ML IJ SOLN: 100.0000 mg | Freq: Every day | INTRAMUSCULAR | Status: DC

## 2022-02-17 MED ORDER — FENTANYL CITRATE PF 50 MCG/ML IJ SOSY
25.0000 ug | PREFILLED_SYRINGE | INTRAMUSCULAR | Status: DC | PRN
  Administered 2022-02-17 – 2022-02-18 (×4): 25 ug via INTRAVENOUS
  Filled 2022-02-17 (×4): qty 1

## 2022-02-17 MED ORDER — ALBUTEROL SULFATE (2.5 MG/3ML) 0.083% IN NEBU: 2.5000 mg | INHALATION_SOLUTION | Freq: Four times a day (QID) | RESPIRATORY_TRACT | Status: DC | PRN

## 2022-02-17 MED ORDER — ONDANSETRON HCL 4 MG PO TABS: 4.0000 mg | ORAL_TABLET | Freq: Four times a day (QID) | ORAL | Status: DC | PRN

## 2022-02-17 MED ORDER — FOLIC ACID 1 MG PO TABS: 1.0000 mg | ORAL_TABLET | Freq: Every day | ORAL | Status: DC

## 2022-02-17 MED ORDER — ADULT MULTIVITAMIN W/MINERALS CH
1.0000 | ORAL_TABLET | Freq: Every day | ORAL | Status: DC
  Administered 2022-02-18 – 2022-02-24 (×7): 1 via ORAL
  Filled 2022-02-17 (×7): qty 1

## 2022-02-17 MED ORDER — OXYCODONE-ACETAMINOPHEN 5-325 MG PO TABS
1.0000 | ORAL_TABLET | ORAL | Status: DC | PRN
  Administered 2022-02-17: 1 via ORAL
  Filled 2022-02-17: qty 1

## 2022-02-17 MED ORDER — MORPHINE SULFATE (PF) 4 MG/ML IV SOLN
4.0000 mg | Freq: Once | INTRAVENOUS | Status: AC
  Administered 2022-02-17: 4 mg via INTRAVENOUS
  Filled 2022-02-17: qty 1

## 2022-02-17 MED ORDER — ONDANSETRON HCL 4 MG/2ML IJ SOLN: 4.0000 mg | Freq: Four times a day (QID) | INTRAMUSCULAR | Status: DC | PRN

## 2022-02-17 NOTE — ED Notes (Signed)
Patient states pain in back and left arm

## 2022-02-17 NOTE — Progress Notes (Signed)
Pt arrived to unit, informed of POC, all needs addressed at this time

## 2022-02-17 NOTE — H&P (Addendum)
History and Physical    Patient: [name] RRN:[id number] DOB: 1971-05-05 DOA: 02/17/2022 DOS: the patient was seen and examined on 02/17/2022 PCP: [name], MD  Patient coming from: Home  Chief Complaint:  Chief Complaint  Patient presents with   Fall   HPI: [name] is a 51 y.o. male with medical history significant of hypertension, hyperlipidemia, GERD, tobacco abuse, and recent diagnosis of multiple myeloma followed by Dr. Lorenso Courier of oncology presents complaining of lower back and left arm pain.  He had gotten up to use the restroom early this morning after he had watched the football games.  He had some pain in his lower back upon sitting up and switching positions to stand up, but pain was sharp and severe causing him to fall.  He tried to brace himself with his left arm, but developed severe pain in the left shoulder.  Patient fell onto the bed and then into the floor.  Denied any loss of consciousness or trauma to his head.  He was told to be careful with his left arm as there were lesions related to his multiple,.  He had just recently had kyphoplasty with interventional radiology on 9/13 for L3 compression fracture.  He is in the process of being started on Revlimid for treatment of his multiple myeloma.    Upon admission to the emergency department patient was noted to have stable vital signs.  Labs significant for hemoglobin 11.3, sodium 133, albumin 2.9, and total protein 9.4.  CT scan of the head and cervical spine noted widespread cervical and visible upper thoracic vertebral involvement by multiple myeloma without any focal cervical fracture.  X-ray imaging of the chest and pelvis noted no acute abnormality.  X-rays of the left shoulder revealed oblique displaced fracture of the proximal left humeral shaft with associated lucent lesion concerning for pathologic compression fracture.  Patient given fentanyl IV, morphine IV, and oxycodone as needed for pain.  Review  of Systems: As mentioned in the history of present illness. All other systems reviewed and are negative. Past Medical History:  Diagnosis Date   Allergy    Atypical chest pain    GERD (gastroesophageal reflux disease)    Hepatic cyst    Benign by MRI   Hiatal hernia    History of colonic polyps    Hyperlipidemia    Hypertension    Rectal bleeding    Tobacco abuse    Past Surgical History:  Procedure Laterality Date   COLONOSCOPY  01/10/2009     RMR: Normal rectum, normal colon; repeat in 2015 due to [place] of colon cancer   COLONOSCOPY N/A 01/09/2014   [id number] colonic polyps-removed as described   COLONOSCOPY N/A 09/18/2016   Procedure: COLONOSCOPY;  Surgeon: Daneil Dolin, MD;  [facility];  Service: Endoscopy;  Laterality: N/A;  730    ESOPHAGOGASTRODUODENOSCOPY  10/04/2007   RMR: Distal esophageal erosions consistent with erosive reflux esophagitis, patulous gastroesophageal junction status post passage of a  Maloney dilator, 42 [place].  Otherwise, unremarkable esophagus.  Hiatal hernia.  Otherwise normal stomach.  Bulbar erosion   ESOPHAGOGASTRODUODENOSCOPY N/A 01/09/2014   Erosive reflux esophagitis. Small hiatal hernia   I & D EXTREMITY Left 07/26/2018   Procedure: IRRIGATION AND DEBRIDEMENT EXTREMITY;  Surgeon: Dayna Barker, MD;  Location: [place];  Service: Plastics;  Laterality: Left;   IR BONE TUMOR(S)RF ABLATION  02/05/2022   IR KYPHO LUMBAR INC FX REDUCE BONE BX UNI/BIL CANNULATION INC/IMAGING  02/05/2022  PERCUTANEOUS PINNING Left 07/26/2018   Procedure: PERCUTANEOUS PINNING EXTREMITY;  Surgeon: Coley, Harrill, MD;  Location: [facility];  Service: Plastics;  Laterality: Left;   Social History:  reports that he has been smoking cigarettes. He has a 2.50 pack-year smoking history. He has never used smokeless tobacco. He reports current alcohol use. He reports that he does not use drugs.  No Known Allergies  Family History  Problem Relation Age of Onset   Heart  disease Mother    Hypertension Mother    Diabetes Father    Hypertension Father    Colon cancer Sister        passed away from colon cancer, in her 50s   Heart attack Maternal Uncle    Prostate cancer Maternal Uncle    Diabetes Other    Pancreatic cancer Neg Hx    Rectal cancer Neg Hx    Esophageal cancer Neg Hx    Stomach cancer Neg Hx     Prior to Admission medications   Medication Sig Start Date End Date Taking? Authorizing Provider  docusate sodium (COLACE) 100 MG capsule Take 1 capsule (100 mg total) by mouth every 12 (twelve) hours. 02/04/22   Messick, Peter C, MD  hydrochlorothiazide (HYDRODIURIL) 25 MG tablet Take 1 tablet (25 mg total) by mouth daily. 12/05/21   Wilson, Amelia, MD  ibuprofen (ADVIL) 600 MG tablet Take 1 tablet (600 mg total) by mouth every 6 (six) hours as needed. Patient not taking: Reported on 02/13/2022 01/04/22   Robbins, Cooper A, PA  lenalidomide (REVLIMID) 25 MG capsule Take 1 capsule (25 mg total) by mouth daily. Take for 14 days and then none for 7 days. Repeat every 21 days. [employer] Auth # [id number] Date Obtained 02/13/22 02/14/22   Dorsey, John T IV, MD  lisinopril (ZESTRIL) 40 MG tablet Take 1 tablet (40 mg total) by mouth daily. 12/05/21   Wilson, Amelia, MD  oxyCODONE (OXY IR/ROXICODONE) 5 MG immediate release tablet Take 1 tablet (5 mg total) by mouth every 4 (four) hours as needed for severe pain. 02/13/22   Dorsey, John T IV, MD  pantoprazole (PROTONIX) 40 MG tablet Take 1 tablet (40 mg total) by mouth daily. 12/05/21   Wilson, Amelia, MD  rosuvastatin (CRESTOR) 10 MG tablet Take 1 tablet (10 mg total) by mouth daily. 12/21/21   Wilson, Amelia, MD    Physical Exam: Vitals:   02/17/22 0826 02/17/22 1046 02/17/22 1215 02/17/22 1400  BP: 133/77 135/82 136/87 (!) 150/77  Pulse: 80 75 89 72  Resp: 18 18 18 16  Temp: 98.4 F (36.9 C) 98.5 F (36.9 C)    TempSrc: Oral Oral    SpO2: 99% 97% 99% 99%  Exam  Constitutional: Middle-aged male currently in  no acute distress Eyes: PERRL, lids and conjunctivae normal ENMT: Mucous membranes are moist. Posterior pharynx clear of any exudate or lesions.  Neck: normal, supple  Respiratory: clear to auscultation bilaterally, no wheezing, no crackles. Normal respiratory effort. No accessory muscle use.  Cardiovascular: Regular rate and rhythm, no murmurs / rubs / gallops. No extremity edema. 2+ pedal pulses. No carotid bruits.  Abdomen: no tenderness, no masses palpated. No hepatosplenomegaly. Bowel sounds positive.  Musculoskeletal: no clubbing / cyanosis.  TLSO brace in place.  Left arm currently in sling Skin: no rashes, lesions, ulcers. No induration Neurologic: CN 2-12 grossly intact.  Strength 5/5 in all 4.  Psychiatric: Normal judgment and insight. Alert and oriented x 3. Normal mood.   Data Reviewed:    EKG revealed normal sinus rhythm at 92 bpm.  Reviewed labs, imaging, and pertinent records as noted above in HPI   Assessment and Plan:  Pathalogic proximal left humeral and lumbar compression fracturesFall at home Acute. Patient presents and was found to have a displaced proximal left humeral fracture along with compression fractures of L1 and L2.  Humerus fracture is in the same location the bone survey on 8/30 noted a heterogeneous lytic lesion of the proximal left humerus measuring 2.6 x 4.9 cm. Patient had just underwent kyphoplasty of L3 compression fracture with interventional radiology on 9/13. -Admit to med-surgical bed -Continue TLSO brace -Continue arm sling.  Will need to discuss with orthopedics to ensure follow-up -Oxycodone/IV fentanyl as needed for moderate to severe pain respectively -PT to evaluate and treat -IR consulted to evaluate  Normocytic anemia Acute.  Hemoglobin 11.3 g/dL, but previously had been 12.8 on 9/18 and within normal limits prior to that.  Repeat hemoglobin 11.6. -Continue to monitor H&H  Multiple myeloma Patient currently following with Dr. Lorenso Courier,  but has not yet started treatment of Revlimid. -Continue outpatient follow-up with Dr. Lorenso Courier  Hyponatremia Acute.  Sodium 133 on admission.  Could be related with hydrochlorothiazide use. -Recheck sodium level in a.m.  Essential hypertension Home blood pressure regimen includes hydrochlorothiazide 25 mg daily and lisinopril 40 mg daily. -Continue lisinopril -Hydrochlorothiazide due to hyponatremia  GERD -Continue Protonix  Obesity BMI 36.43 kg/m  Advance Care Planning:   Code Status: Full Code   Consults: IR  Family Communication: None requested  Severity of Illness: The appropriate patient status for this patient is OBSERVATION. Observation status is judged to be reasonable and necessary in order to provide the required intensity of service to ensure the patient's safety. The patient's presenting symptoms, physical exam findings, and initial radiographic and laboratory data in the context of their medical condition is felt to place them at decreased risk for further clinical deterioration. Furthermore, it is anticipated that the patient will be medically stable for discharge from the hospital within 2 midnights of admission.   Author: Norval Morton, MD 02/17/2022 2:28 PM  For on call review [email or web address].

## 2022-02-17 NOTE — ED Provider Notes (Signed)
[facility] Provider Note   CSN: [id number] Arrival date & time: 02/17/22  0430     History  Chief Complaint  Patient presents with  . Fall    [name] is a 51 y.o. male.   Fall   51 year old male presents emergency department with complaints of low back and left shoulder pain.  Patient states that incident occurred when he was getting out of bed because he had to urinate earlier this morning.  Patient denies any overt fall.  Quick onset low back pain occurred with shifting of position.  He attempted to brace himself with his left arm which then caused the left shoulder pain.  He states that he/down to the floor in excruciating pain.  EMS was called but then assisted him to the emergency department because patient was unable to move by himself.  Patient states he lives by himself at home.  Denies weakness/sensory deficit in lower extremities, saddle anesthesia, bowel/bladder dysfunction, fever, history of IV drug use.  Patient states he has a history of multiple myeloma and recently had a prior surgical procedure performed on his lower back due to fracture.  He states that he has been unable to move his left arm since the incident.  Denies numbness or weakness in affected arm but states any movement of his left shoulder causes pain.  Denies trauma to head or loss of consciousness.  Patient is a known history of multiple myeloma with planned at home treatment for tomorrow.  Past medical history significant for multiple myeloma, hypertension, hyperlipidemia, alcohol/tobacco use  Home Medications Prior to Admission medications   Medication Sig Start Date End Date Taking? Authorizing Provider  acetaminophen (TYLENOL) 500 MG tablet Take 500 mg by mouth every 6 (six) hours as needed for mild pain or moderate pain.   Yes [provider]  hydrochlorothiazide (HYDRODIURIL) 25 MG tablet Take 1 tablet (25 mg total) by mouth daily. 12/05/21  Yes  [name], MD  ibuprofen (ADVIL) 600 MG tablet Take 1 tablet (600 mg total) by mouth every 6 (six) hours as needed. Patient taking differently: Take 600 mg by mouth every 6 (six) hours as needed for mild pain or moderate pain. 01/04/22  Yes Dion Saucier A, PA  lisinopril (ZESTRIL) 40 MG tablet Take 1 tablet (40 mg total) by mouth daily. 12/05/21  Yes [name], MD  oxyCODONE (OXY IR/ROXICODONE) 5 MG immediate release tablet Take 1 tablet (5 mg total) by mouth every 4 (four) hours as needed for severe pain. 02/13/22  Yes Orson Slick, MD  pantoprazole (PROTONIX) 40 MG tablet Take 1 tablet (40 mg total) by mouth daily. 12/05/21  Yes [name], MD  docusate sodium (COLACE) 100 MG capsule Take 1 capsule (100 mg total) by mouth every 12 (twelve) hours. Patient not taking: Reported on 02/17/2022 02/04/22   Valarie Merino, MD  lenalidomide (REVLIMID) 25 MG capsule Take 1 capsule (25 mg total) by mouth daily. Take for 14 days and then none for 7 days. Repeat every 21 days. [employer] Auth # [id number] Date Obtained 02/13/22 Patient not taking: Reported on 02/17/2022 02/14/22   Orson Slick, MD  rosuvastatin (CRESTOR) 10 MG tablet Take 1 tablet (10 mg total) by mouth daily. Patient not taking: Reported on 02/17/2022 12/21/21   [name], MD      Allergies    Patient has no known allergies.    Review of Systems   Review of Systems  All  other systems reviewed and are negative.   Physical Exam Updated Vital Signs BP (!) 144/87 (BP Location: Right Arm)   Pulse 80   Temp 98.8 F (37.1 C) (Oral)   Resp 18   SpO2 97%  Physical Exam Vitals and nursing note reviewed.  Constitutional:      General: He is not in acute distress.    Appearance: He is well-developed. He is obese.  HENT:     Head: Normocephalic and atraumatic.     Nose: Nose normal.  Eyes:     Extraocular Movements: Extraocular movements intact.     Conjunctiva/sclera: Conjunctivae normal.     Pupils: Pupils are  equal, round, and reactive to light.  Cardiovascular:     Rate and Rhythm: Normal rate and regular rhythm.     Pulses: Normal pulses.     Heart sounds: No murmur heard. Pulmonary:     Effort: Pulmonary effort is normal. No respiratory distress.     Breath sounds: Normal breath sounds. No wheezing or rales.  Abdominal:     Palpations: Abdomen is soft.     Tenderness: There is no abdominal tenderness.  Musculoskeletal:        General: No swelling.     Cervical back: Normal range of motion and neck supple. No rigidity or tenderness.     Comments: Patient has midline tenderness to palpation of lumbar spine with no obvious step-off or deformity noted.  No tenderness to palpation of cervical or thoracic spine with no obvious step-off or deformity.  No tenderness palpation along lower extremities.  Patient has no tenderness to palpation of right upper extremity.  Patient has tenderness to patient of left proximal humerus.  And is guarding affected shoulder.  No tenderness to palpation of left scapula or left clavicle.  Patient has full active range of motion of left elbow wrist and digits.  Radial and posterior tibial pulses full intact bilaterally.  Patient planing no sensory deficits along major restrictions in the lower and upper extremities.  No tenderness palpation of anterior or posterior chest wall.  Muscle strength 5 out of 5 for upper and lower extremities.  Skin:    General: Skin is warm and dry.     Capillary Refill: Capillary refill takes less than 2 seconds.  Neurological:     Mental Status: He is alert.  Psychiatric:        Mood and Affect: Mood normal.     ED Results / Procedures / Treatments   Labs (all labs ordered are listed, but only abnormal results are displayed) Labs Reviewed  CBC WITH DIFFERENTIAL/PLATELET - Abnormal; Notable for the following components:      Result Value   RBC 3.88 (*)    Hemoglobin 11.3 (*)    HCT 34.7 (*)    Abs Immature Granulocytes 0.10 (*)     All other components within normal limits  COMPREHENSIVE METABOLIC PANEL - Abnormal; Notable for the following components:   Sodium 133 (*)    CO2 21 (*)    Glucose, Bld 116 (*)    Total Protein 9.4 (*)    Albumin 2.9 (*)    All other components within normal limits  HEMOGLOBIN AND HEMATOCRIT, BLOOD - Abnormal; Notable for the following components:   Hemoglobin 11.6 (*)    HCT 35.9 (*)    All other components within normal limits  PROTIME-INR  HIV ANTIBODY (ROUTINE TESTING W REFLEX)  CBC  BASIC METABOLIC PANEL    EKG None  Radiology DG Shoulder Left  Result Date: 02/17/2022 CLINICAL DATA:  Fall EXAM: LEFT SHOULDER - 3 VIEW COMPARISON:  Shoulder radiograph dated February 04, 2022 FINDINGS: Oblique displaced fracture of the proximal left humeral shaft. Associated lucent lesion of the proximal left humerus is again seen. Associated soft tissue edema of the proximal left arm. Visualized lung is clear. IMPRESSION: Oblique displaced fracture of the proximal left humeral shaft with associated lucent lesion, findings are concerning for pathologic compression fracture. Electronically Signed   By: Yetta Glassman M.D.   On: 02/17/2022 13:13   DG Pelvis Portable  Result Date: 02/17/2022 CLINICAL DATA:  Fall. EXAM: PORTABLE PELVIS 1-2 VIEWS COMPARISON:  CT abdomen and pelvis 07/21/2021 FINDINGS: No acute fracture or pelvic diastasis is identified. There is mild left hip joint space narrowing. IMPRESSION: No acute osseous abnormality identified. Electronically Signed   By: Logan Bores M.D.   On: 02/17/2022 13:09   DG Chest Portable 1 View  Result Date: 02/17/2022 CLINICAL DATA:  Fall. EXAM: PORTABLE CHEST 1 VIEW COMPARISON:  Chest radiographs 05/04/2019 FINDINGS: The cardiomediastinal silhouette is unchanged with normal heart size. Lung volumes are low with bronchovascular crowding. No confluent airspace opacity, overt pulmonary edema, sizable pleural effusion, or pneumothorax is  identified. No acute osseous abnormality is seen. IMPRESSION: No active disease. Electronically Signed   By: Logan Bores M.D.   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 Low back pain. Status post recent L3 vertebral body biopsy, RF ablation, augmentation on 02/05/2022. Pathology revealed plasmacytoma. EXAM: CT LUMBAR SPINE WITHOUT CONTRAST TECHNIQUE: Multidetector CT imaging of the lumbar spine was performed without intravenous contrast administration. Multiplanar CT image reconstructions were also generated. RADIATION DOSE REDUCTION: This exam was performed according to the departmental dose-optimization program which includes automated exposure control, adjustment of the mA and/or kV according to patient size and/or use of iterative reconstruction technique. COMPARISON:  Lumbar MRI 01/07/2022. CT Abdomen and Pelvis 07/21/2021. FINDINGS: Segmentation: Normal. Alignment: Stable lumbar lordosis. Vertebrae: Pronounced heterogeneity of bone mineralization since February this year. Diffuse lytic vertebral and visible pelvic bone lesions. Mild pathologic compression fracture of L1. More moderate pathologic compression fracture of L2, with lucency of the anterior superior endplate and 35% central loss of vertebral body height compared to February. No retropulsion of bone. L2 pedicles and posterior elements appear intact. Augmented moderate to severe L3 pathologic compression fracture. No significant L4 vertebral compression. Subtle L5 pathologic vertebral compression. Visible sacral ala appear intact despite myelomas involvement. Paraspinal and other soft tissues: Negative visible noncontrast abdominal viscera. Lumbar paraspinal soft tissues remain within normal limits. Disc levels: Lumbar spine degeneration predominantly moderate to severe facet arthropathy at L4-L5 where there is subtle chronic grade 1  anterolisthesis. IMPRESSION: 1. Diffuse Multiple Myeloma involvement of the visible spine and pelvis, new since a February CT. 2. Augmented compression fracture of L3. Mild-to-moderate pathologic compression fracture of L2 (22% loss of vertebral body height, no complicating features). Mild pathologic compression fracture of L1. Electronically Signed   By: Genevie Ann M.D.   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 Low back pain. Status post recent L3 vertebral body biopsy, RF ablation, augmentation on 02/05/2022. Pathology revealed plasmacytoma. Neck pain. EXAM: CT CERVICAL SPINE WITHOUT CONTRAST TECHNIQUE: Multidetector CT imaging of the cervical spine was performed without intravenous contrast. Multiplanar CT image  reconstructions were also generated. RADIATION DOSE REDUCTION: This exam was performed according to the departmental dose-optimization program which includes automated exposure control, adjustment of the mA and/or kV according to patient size and/or use of iterative reconstruction technique. COMPARISON:  Cervical spine radiographs 12/18/2021. FINDINGS: Alignment: Straightening of cervical lordosis. Cervicothoracic junction alignment is within normal limits. Bilateral posterior element alignment is within normal limits. Skull base and vertebrae: Visualized skull base is intact. No atlanto-occipital dissociation. C1 and C2 appear intact and aligned. Widely scattered ill-defined lucent lesions are present elsewhere in the cervical spine and visible upper thoracic spine C3 through T2. No cervical pathologic fracture is identified. Posterior element involvement is noted on the right at C4 (series 4, image 44). No expansile cervical lesion at this time. Soft tissues and spinal canal: No prevertebral fluid or swelling. No visible canal hematoma. Negative noncontrast visible neck soft tissues. Disc  levels: Widespread superimposed cervical spine disc and endplate degeneration. Multilevel degenerative cervical spinal stenosis, mild to moderate at C5-C6. Upper chest: T1 and T2 vertebral myeloma thus involvement, including an indistinct 10 mm lucent lesion of the left T1 body. Lung apices are clear. Negative visible noncontrast superior mediastinum. Other: Negative visible noncontrast posterior fossa. Visible paranasal sinuses, tympanic cavities and mastoids are well aerated. IMPRESSION: 1. Widespread cervical and visible upper thoracic vertebral involvement by Multiple Myeloma. No cervical pathologic fracture. 2. Superimposed bulky cervical spine degeneration. Subsequent mild to moderate degenerative spinal stenosis suspected at C5-C6. Electronically Signed   By: Genevie Ann M.D.   On: 02/17/2022 09:41    Procedures Procedures    Medications Ordered in ED Medications  enoxaparin (LOVENOX) injection 40 mg (40 mg Subcutaneous Given 02/17/22 1814)  acetaminophen (TYLENOL) tablet 650 mg (has no administration in time range)    Or  acetaminophen (TYLENOL) suppository 650 mg (has no administration in time range)  ondansetron (ZOFRAN) tablet 4 mg (has no administration in time range)    Or  ondansetron (ZOFRAN) injection 4 mg (has no administration in time range)  albuterol (PROVENTIL) (2.5 MG/3ML) 0.083% nebulizer solution 2.5 mg (has no administration in time range)  fentaNYL (SUBLIMAZE) injection 25 mcg (25 mcg Intravenous Given 02/17/22 1812)  oxyCODONE (Oxy IR/ROXICODONE) immediate release tablet 5-10 mg (has no administration in time range)  polyethylene glycol (MIRALAX / GLYCOLAX) packet 17 g (17 g Oral Not Given 02/17/22 1819)  morphine (PF) 4 MG/ML injection 4 mg (4 mg Intravenous Given 02/17/22 1344)  fentaNYL (SUBLIMAZE) injection 50 mcg (50 mcg Intravenous Given 02/17/22 1455)    ED Course/ Medical Decision Making/ A&P Clinical Course as of 02/17/22 1827  Mon Feb 17, 2022  1413 Consulted Dr.  Lorenso Courier of oncology.  He was agreeable with admission of the patient with consultation to IR for stabilization of the fracture. [CR]  1500 Interventional radiology was consulted regarding the patient who agreed to see the patient for surgical intervention. [CR]  54 Dr. Tamala Julian of hospital medicine was consulted on the patient.  He agrees admission of the patient assuming further treatment/care. [CR]    Clinical Course User Index [CR] Wilnette Kales, PA                           Medical Decision Making Risk Prescription drug management. Decision regarding hospitalization.   This patient presents to the ED for concern of low back pain, left shoulder pain, this involves an extensive number of treatment options, and is a complaint that carries with  it a high risk of complications and morbidity.  The differential diagnosis includes fracture, strain/sprain, dislocation,   Co morbidities that complicate the patient evaluation  See HPI   Additional history obtained:  Additional history obtained from EMR External records from outside source obtained and reviewed including oncologist note from 02/13/2022   Lab Tests:  I Ordered, and personally interpreted labs.  The pertinent results include: No leukocytosis noted.  Mild evidence anemia with a hemoglobin 11.3 which is decreased from patient's previous study 7 days ago of 12.8.  No platelet abnormalities.  Mild hyponatremia with a sodium of 133 with a decrease in bicarb of 21.  No transaminitis noted.  Renal function within normal limits.  PT/INR within normal limits.   Imaging Studies ordered:  I ordered imaging studies including left shoulder x-ray, portable pelvis x-ray, portable chest x-ray, lumbar spine CT, cervical spine CT I independently visualized and interpreted imaging which showed  Left shoulder x-ray: Oblique displaced fracture of proximal left humeral shaft with associated lucent lesion concerning for pathologic compression  fracture. Pelvis x-ray: No acute osseous abnormality Chest x-ray: No acute cardiopulmonary or osseous abnormality CT lumbar spine: Diffuse multiple myeloma involvement of the spine new since February CT.  Augmented compression fracture of L3.  Mild to moderate pathologic compression fracture of L2 with 22% loss of vertebral body height with no complicating features.  Mild pathologic compression fracture of L1. CT cervical spine: Widespread cervical and visible upper thoracic vertebral involvement and multiple myeloma.  No pathologic goal cervical fractures.  Superimposed bulky cervical spine degeneration.  Subsequent mild to moderate degenerative spinal stenosis at C5-C6. I agree with the radiologist interpretation  Cardiac Monitoring: / EKG:  The patient was maintained on a cardiac monitor.  I personally viewed and interpreted the cardiac monitored which showed an underlying rhythm of: Sinus rhythm   Consultations Obtained:  See ED course  Problem List / ED Course / Critical interventions / Medication management  Multiple compression fractures lumbar spine as well as of the compression fracture of the left humerus. I ordered medication including Percocet, morphine and fentanyl for pain.  Zofran for nausea  Reevaluation of the patient after these medicines showed that the patient improved I have reviewed the patients home medicines and have made adjustments as needed   Social Determinants of Health:  Chronic cigarette and alcohol use.  Denies illicit drug use.   Test / Admission - Considered:  Multiple pathologic fractures with history of multiple myeloma. Vitals signs significant for persistent hypertension with a blood pressure of 144/87.  Recommend close follow-up with PCP regarding elevation of blood pressure.. Otherwise within normal range and stable throughout visit. Laboratory/imaging studies significant for: See above Given evidence of patient's multiple fractures as well as  history of multiple myeloma, admission to hospital deemed most necessary.  IR consulted over the patient for surgical intervention of patient's lumbar compression fractures while in the emergency department.  Patient's left humeral fracture was placed in sling and swath.  Patient was also placed in LSO brace for compression fractures; patient exhibiting no neurologic compromise this left-sided compression fractures concerning for more concerning pathology as depicted in PE.  Hospital medicine was consulted evaluated patient and agreed with admission of the patient doing further treatment/care.  Patient's pain was controlled while in the emergency department with administration of IV opioid medication.  Treatment plan discussed with patient he acknowledged understand was agreeable to said plan. Treatment plan were discussed at length with patient and they knowledge understanding  was agreeable to said plan.  Appropriate consultations were made as described in the ED course.  Patient was stable upon admission to the hospital.         Final Clinical Impression(s) / ED Diagnoses Final diagnoses:  Closed compression fracture of L2 lumbar vertebra, initial encounter ([facility])  Closed compression fracture of L1 lumbar vertebra, initial encounter ([place] [facility])  Closed displaced oblique fracture of shaft of left humerus, initial encounter    Rx / DC Orders ED Discharge Orders     None         Wilnette Kales, [place] 02/17/22 1827    Pattricia Boss, MD 02/18/22 [phone]

## 2022-02-17 NOTE — ED Triage Notes (Signed)
Per EMS, pt had a mechanical fall, no thinners.  Did not hit head, no LOC, had surgery on L3 two weeks ago.  Pt stood up, fell a sharp pain in back caused him to fall, onto his left shoulder causing pain.  Possible to left shoulder, now in sling.  Was initially placed in c-collar, cleared by Provider in triage.    Pain started at a 10, was given 4 does of 38mg of fentanyl through 20g in R AC, pain is now a 7.    152/76 HR 96 sinus  95% RA CBG 124

## 2022-02-17 NOTE — Progress Notes (Signed)
Orthopedic Tech Progress Note Patient Details:  [name] 24-Sep-1970 [id number]  Ortho Devices Type of Ortho Device: Lumbar corsett, Sling immobilizer Ortho Device/Splint Location: BACK, LUE Ortho Device/Splint Interventions: Ordered, Application, Adjustment   Post Interventions Patient Tolerated: Well, Fair Instructions Provided: Care of device  Janit Pagan 02/17/2022, 3:22 PM

## 2022-02-17 NOTE — ED Provider Triage Note (Signed)
Emergency Medicine Provider Triage Evaluation Note  [name] , a 51 y.o. male  was evaluated in triage.  Pt with recent diagnosis of multiple myeloma not yet having started treatment and recent surgery of the lumbar spine per patient who presents today after fall at home.  Patient states that he was walking and felt such severe pain in his back that he dropped to the ground.  No numbness or tingling in the legs no saddle anesthesia.  No head trauma but pain in his neck and his low back at this time.  Pain in his left shoulder Review of Systems  Positive: As above Negative: Chest pain shortness of breath syncope head trauma  Physical Exam  BP 138/82 (BP Location: Right Arm)   Pulse 97   Temp 98.4 F (36.9 C) (Oral)   Resp 15   SpO2 98%  Gen:   Awake, no distress   Resp:  Normal effort  MSK:   Moves extremities without difficulty  Other:  Unable to roll patient due to discomfort, lying in recliner in triage.  Left arm in sling, question possible deformity, exquisite tenderness palpation over the left shoulder.  No instability of the pelvis, no evidence of deformities elsewhere in the extremities.  Medical Decision Making  Medically screening exam initiated at 5:10 AM.  Appropriate orders placed.  [name] was informed that the remainder of the evaluation will be completed by another provider, this initial triage assessment does not replace that evaluation, and the importance of remaining in the ED until their evaluation is complete.  Work up initiated.   This chart was dictated using voice recognition software, Dragon. Despite the best efforts of this provider to proofread and correct errors, errors may still occur which can change documentation meaning.     Emeline Darling, PA-C 02/17/22 [phone]

## 2022-02-18 ENCOUNTER — Telehealth: Payer: Self-pay | Admitting: Hematology and Oncology

## 2022-02-18 ENCOUNTER — Encounter (HOSPITAL_COMMUNITY): Payer: Self-pay | Admitting: Internal Medicine

## 2022-02-18 ENCOUNTER — Other Ambulatory Visit: Payer: Self-pay

## 2022-02-18 ENCOUNTER — Observation Stay: Payer: BC Managed Care – PPO

## 2022-02-18 DIAGNOSIS — S32039A Unspecified fracture of third lumbar vertebra, initial encounter for closed fracture: Secondary | ICD-10-CM | POA: Diagnosis present

## 2022-02-18 DIAGNOSIS — E871 Hypo-osmolality and hyponatremia: Secondary | ICD-10-CM | POA: Diagnosis not present

## 2022-02-18 DIAGNOSIS — S32010A Wedge compression fracture of first lumbar vertebra, initial encounter for closed fracture: Secondary | ICD-10-CM | POA: Diagnosis not present

## 2022-02-18 DIAGNOSIS — C9000 Multiple myeloma not having achieved remission: Secondary | ICD-10-CM | POA: Diagnosis present

## 2022-02-18 DIAGNOSIS — E222 Syndrome of inappropriate secretion of antidiuretic hormone: Secondary | ICD-10-CM | POA: Diagnosis present

## 2022-02-18 DIAGNOSIS — E669 Obesity, unspecified: Secondary | ICD-10-CM | POA: Diagnosis present

## 2022-02-18 DIAGNOSIS — W1839XA Other fall on same level, initial encounter: Secondary | ICD-10-CM | POA: Diagnosis present

## 2022-02-18 DIAGNOSIS — S32020A Wedge compression fracture of second lumbar vertebra, initial encounter for closed fracture: Secondary | ICD-10-CM

## 2022-02-18 DIAGNOSIS — M84622A Pathological fracture in other disease, left humerus, initial encounter for fracture: Secondary | ICD-10-CM | POA: Diagnosis not present

## 2022-02-18 DIAGNOSIS — M84422A Pathological fracture, left humerus, initial encounter for fracture: Secondary | ICD-10-CM | POA: Diagnosis present

## 2022-02-18 DIAGNOSIS — Z79899 Other long term (current) drug therapy: Secondary | ICD-10-CM | POA: Diagnosis not present

## 2022-02-18 DIAGNOSIS — Y92003 Bedroom of unspecified non-institutional (private) residence as the place of occurrence of the external cause: Secondary | ICD-10-CM | POA: Diagnosis not present

## 2022-02-18 DIAGNOSIS — D630 Anemia in neoplastic disease: Secondary | ICD-10-CM | POA: Diagnosis present

## 2022-02-18 DIAGNOSIS — E860 Dehydration: Secondary | ICD-10-CM | POA: Diagnosis present

## 2022-02-18 DIAGNOSIS — M545 Low back pain, unspecified: Secondary | ICD-10-CM | POA: Diagnosis present

## 2022-02-18 DIAGNOSIS — Z86010 Personal history of colonic polyps: Secondary | ICD-10-CM | POA: Diagnosis not present

## 2022-02-18 DIAGNOSIS — S42332A Displaced oblique fracture of shaft of humerus, left arm, initial encounter for closed fracture: Secondary | ICD-10-CM

## 2022-02-18 DIAGNOSIS — K219 Gastro-esophageal reflux disease without esophagitis: Secondary | ICD-10-CM | POA: Diagnosis not present

## 2022-02-18 DIAGNOSIS — E785 Hyperlipidemia, unspecified: Secondary | ICD-10-CM | POA: Diagnosis present

## 2022-02-18 DIAGNOSIS — I10 Essential (primary) hypertension: Secondary | ICD-10-CM | POA: Diagnosis present

## 2022-02-18 DIAGNOSIS — Z6836 Body mass index (BMI) 36.0-36.9, adult: Secondary | ICD-10-CM | POA: Diagnosis not present

## 2022-02-18 DIAGNOSIS — F17210 Nicotine dependence, cigarettes, uncomplicated: Secondary | ICD-10-CM | POA: Diagnosis present

## 2022-02-18 DIAGNOSIS — Z8249 Family history of ischemic heart disease and other diseases of the circulatory system: Secondary | ICD-10-CM | POA: Diagnosis not present

## 2022-02-18 DIAGNOSIS — K2100 Gastro-esophageal reflux disease with esophagitis, without bleeding: Secondary | ICD-10-CM | POA: Diagnosis present

## 2022-02-18 LAB — BASIC METABOLIC PANEL
Anion gap: 3 — ABNORMAL LOW (ref 5–15)
BUN: 11 mg/dL (ref 6–20)
CO2: 25 mmol/L (ref 22–32)
Calcium: 9.5 mg/dL (ref 8.9–10.3)
Chloride: 104 mmol/L (ref 98–111)
Creatinine, Ser: 0.93 mg/dL (ref 0.61–1.24)
GFR, Estimated: >60 mL/min (ref >60)
Glucose, Bld: 103 mg/dL — ABNORMAL HIGH (ref 70–99)
Potassium: 4 mmol/L (ref 3.5–5.1)
Sodium: 132 mmol/L — ABNORMAL LOW (ref 135–145)

## 2022-02-18 LAB — CBC
HCT: 35.7 % — ABNORMAL LOW (ref 39.0–52.0)
Hemoglobin: 11.6 g/dL — ABNORMAL LOW (ref 13.0–17.0)
MCH: 29 pg (ref 26.0–34.0)
MCHC: 32.5 g/dL (ref 30.0–36.0)
MCV: 89.3 fL (ref 80.0–100.0)
Platelets: 229 10*3/uL (ref 150–400)
RBC: 4 MIL/uL — ABNORMAL LOW (ref 4.22–5.81)
RDW: 13 % (ref 11.5–15.5)
WBC: 7.9 10*3/uL (ref 4.0–10.5)
nRBC: 0 % (ref 0.0–0.2)

## 2022-02-18 MED ORDER — DOCUSATE SODIUM 100 MG PO CAPS
200.0000 mg | ORAL_CAPSULE | Freq: Two times a day (BID) | ORAL | Status: DC
  Administered 2022-02-18 – 2022-02-24 (×13): 200 mg via ORAL
  Filled 2022-02-18 (×13): qty 2

## 2022-02-18 MED ORDER — CEFAZOLIN SODIUM-DEXTROSE 2-4 GM/100ML-% IV SOLN: 2.0000 g | Freq: Once | INTRAVENOUS | Status: AC

## 2022-02-18 MED ORDER — ACETAMINOPHEN 325 MG PO TABS
650.0000 mg | ORAL_TABLET | Freq: Four times a day (QID) | ORAL | Status: DC | PRN
  Administered 2022-02-22: 650 mg via ORAL
  Filled 2022-02-18: qty 2

## 2022-02-18 MED ORDER — NALOXONE HCL 0.4 MG/ML IJ SOLN: 0.4000 mg | INTRAMUSCULAR | Status: DC | PRN

## 2022-02-18 MED ORDER — KETOROLAC TROMETHAMINE 30 MG/ML IJ SOLN
30.0000 mg | Freq: Once | INTRAMUSCULAR | Status: AC
  Administered 2022-02-18: 30 mg via INTRAVENOUS
  Filled 2022-02-18: qty 1

## 2022-02-18 MED ORDER — OXYCODONE HCL 5 MG PO TABS
10.0000 mg | ORAL_TABLET | Freq: Four times a day (QID) | ORAL | Status: DC | PRN
  Administered 2022-02-18 – 2022-02-23 (×10): 10 mg via ORAL
  Filled 2022-02-18 (×10): qty 2

## 2022-02-18 MED ORDER — LACTATED RINGERS IV SOLN: INTRAVENOUS | Status: AC

## 2022-02-18 MED ORDER — MORPHINE SULFATE (PF) 2 MG/ML IV SOLN
2.0000 mg | INTRAVENOUS | Status: DC | PRN
  Administered 2022-02-18 – 2022-02-19 (×6): 2 mg via INTRAVENOUS
  Filled 2022-02-18 (×6): qty 1

## 2022-02-18 MED ORDER — BISACODYL 5 MG PO TBEC: 10.0000 mg | DELAYED_RELEASE_TABLET | Freq: Every day | ORAL | Status: DC | PRN

## 2022-02-18 MED ORDER — MORPHINE SULFATE ER 30 MG PO TBCR
30.0000 mg | EXTENDED_RELEASE_TABLET | Freq: Two times a day (BID) | ORAL | Status: DC
  Administered 2022-02-18 – 2022-02-20 (×5): 30 mg via ORAL
  Filled 2022-02-18 (×5): qty 1

## 2022-02-18 NOTE — Progress Notes (Signed)
PROGRESS NOTE                                                                                                                                                                                                             Patient Demographics:    [name], is a 51 y.o. male, DOB - 04-Jun-1970, TJQ:[id number]  Outpatient Primary MD for the patient is [name], MD    LOS - 0  Admit date - 02/17/2022    Chief Complaint  Patient presents with   Fall       Brief Narrative (HPI from H&P)   51 y.o. male with medical history significant of hypertension, hyperlipidemia, GERD, tobacco abuse, and recent diagnosis of multiple myeloma followed by Dr. Lorenso Courier of oncology presents complaining of lower back and left arm pain.  He had gotten up to use the restroom early this morning after he had watched the football games.  He had some pain in his lower back upon sitting up and switching positions to stand up, but pain was sharp and severe causing him to fall.  He tried to brace himself with his left arm, but developed severe pain in the left shoulder.  Patient fell onto the bed and then into the floor.  The ER his work-up was suggestive of left humerus fracture which was acute, some extension of his old L3 compression fracture and new L1 and L2 compression fractures.  He had excruciating lower back pain pain and was admitted for further care.   Subjective:    [name] today has, No headache, No chest pain, No abdominal pain - No Nausea, No new weakness tingling or numbness, no shortness of breath but is having a lot of lower back pain.   Assessment  & Plan :    Pathological proximal left humeral fracture, lumbar L1-L2 and extension of recent kyphoplasty L3 fracture in a patient with multiple myeloma.   He lives alone his left arm is in a sling due to fracture and is currently having excruciating low back pain and unable to get out  of the bed, case discussed with neurosurgeon Dr. Pieter Partridge Dawley, have consulted IR to see if he will be a candidate for kyphoplasty.  For now TLSO brace, pain control and PT OT.  Weightbearing as tolerated.  He lives alone and currently in no  shape or form stable to go home and lives by himself.    Orthopedics following for left proximal humerus fracture for now they have recommended a sling with outpatient follow-up for surgical correction.    Multiple myeloma with normocytic anemia.  Case discussed with his oncologist Dr. Lorenso Courier he will follow-up postdischarge.  Dehydration and hyponatremia.  Hold HCTZ.  Gently hydrate.  GERD.  On PPI.  Obesity.  BMI 36.  Follow with PCP for weight loss.      Condition - Fair  Family Communication  :  None  Code Status :  Full  Consults  : Orthopedics, neurosurgery Dr. Pieter Partridge Dawley, oncologist Dr. Lorenso Courier, IR  PUD Prophylaxis : PPI   Procedures  :     CT - L spine showing compression fractures at L1-L2 and L3.      Disposition Plan  :    Status is: Observation  DVT Prophylaxis  :    enoxaparin (LOVENOX) injection 40 mg Start: 02/17/22 1600   Lab Results  Component Value Date   PLT 229 02/18/2022    Diet :  Diet Order             Diet Heart Room service appropriate? Yes; Fluid consistency: Thin  Diet effective now                    Inpatient Medications  Scheduled Meds:  enoxaparin (LOVENOX) injection  40 mg Subcutaneous Q24H   lisinopril  40 mg Oral Daily   multivitamin with minerals  1 tablet Oral Daily   pantoprazole  40 mg Oral Daily   polyethylene glycol  17 g Oral Daily   Continuous Infusions: PRN Meds:.acetaminophen **OR** acetaminophen, albuterol, fentaNYL (SUBLIMAZE) injection, hydrALAZINE, ondansetron **OR** ondansetron (ZOFRAN) IV, oxyCODONE  Antibiotics  :    Anti-infectives (From admission, onward)    None        Time Spent in minutes  30   Lala Lund M.D on 02/18/2022 at 9:25 AM  To  page go to [email or web address]   [facility] -  Office  [phone]  See all Orders from today for further details    Objective:   Vitals:   02/17/22 2200 02/17/22 2240 02/18/22 0500 02/18/22 0719  BP: (!) 142/91 (!) 146/95 (!) 151/98 (!) 135/90  Pulse: 86 86 81 87  Resp: $Remo'18 19 19 15  'UzzVl$ Temp:  99 F (37.2 C) 98.6 F (37 C) 98.2 F (36.8 C)  TempSrc:  Oral Oral Oral  SpO2: 91% 98% 100% 98%    Wt Readings from Last 3 Encounters:  02/13/22 111.9 kg  02/10/22 110.6 kg  02/05/22 110.7 kg    No intake or output data in the 24 hours ending 02/18/22 0925   Physical Exam  Awake Alert, No new F.N deficits, Normal affect [place].AT,PERRAL Supple Neck, No JVD,   Symmetrical Chest wall movement, Good air movement bilaterally, CTAB RRR,No Gallops,Rubs or new Murmurs,  +ve B.Sounds, Abd Soft, No tenderness,   Left arm in sling, diffuse ache on palpation in lower L-spine, straight leg raise bilaterally causes pain in lower back.   Data Review:    CBC Recent Labs  Lab 02/17/22 0520 02/17/22 1546 02/18/22 0441  WBC 7.7  --  7.9  HGB 11.3* 11.6* 11.6*  HCT 34.7* 35.9* 35.7*  PLT 213  --  229  MCV 89.4  --  89.3  MCH 29.1  --  29.0  MCHC 32.6  --  32.5  RDW 13.0  --  13.0  LYMPHSABS 2.3  --   --   MONOABS 0.5  --   --   EOSABS 0.0  --   --   BASOSABS 0.0  --   --     Electrolytes Recent Labs  Lab 02/17/22 0520 02/18/22 0441  NA 133* 132*  K 4.0 4.0  CL 103 104  CO2 21* 25  GLUCOSE 116* 103*  BUN 19 11  CREATININE 1.17 0.93  CALCIUM 9.6 9.5  AST 30  --   ALT 33  --   ALKPHOS 47  --   BILITOT 0.5  --   ALBUMIN 2.9*  --   INR 1.0  --     ------------------------------------------------------------------------------------------------------------------ No results for input(s): "CHOL", "HDL", "LDLCALC", "TRIG", "CHOLHDL", "LDLDIRECT" in the last 72 hours.  Lab Results  Component Value Date   HGBA1C 5.9 (H) 12/05/2021     Radiology Reports DG Shoulder  Left  Result Date: 02/17/2022 CLINICAL DATA:  Fall EXAM: LEFT SHOULDER - 3 VIEW COMPARISON:  Shoulder radiograph dated February 04, 2022 FINDINGS: Oblique displaced fracture of the proximal left humeral shaft. Associated lucent lesion of the proximal left humerus is again seen. Associated soft tissue edema of the proximal left arm. Visualized lung is clear. IMPRESSION: Oblique displaced fracture of the proximal left humeral shaft with associated lucent lesion, findings are concerning for pathologic compression fracture. Electronically Signed   By: Yetta Glassman M.D.   On: 02/17/2022 13:13   DG Pelvis Portable  Result Date: 02/17/2022 CLINICAL DATA:  Fall. EXAM: PORTABLE PELVIS 1-2 VIEWS COMPARISON:  CT abdomen and pelvis 07/21/2021 FINDINGS: No acute fracture or pelvic diastasis is identified. There is mild left hip joint space narrowing. IMPRESSION: No acute osseous abnormality identified. Electronically Signed   By: Logan Bores M.D.   On: 02/17/2022 13:09   DG Chest Portable 1 View  Result Date: 02/17/2022 CLINICAL DATA:  Fall. EXAM: PORTABLE CHEST 1 VIEW COMPARISON:  Chest radiographs 05/04/2019 FINDINGS: The cardiomediastinal silhouette is unchanged with normal heart size. Lung volumes are low with bronchovascular crowding. No confluent airspace opacity, overt pulmonary edema, sizable pleural effusion, or pneumothorax is identified. No acute osseous abnormality is seen. IMPRESSION: No active disease. Electronically Signed   By: Logan Bores M.D.   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 Low back pain. Status post recent L3 vertebral body biopsy, RF ablation, augmentation on 02/05/2022. Pathology revealed plasmacytoma. EXAM: CT LUMBAR SPINE WITHOUT CONTRAST TECHNIQUE: Multidetector CT imaging of the lumbar spine was performed without intravenous contrast administration. Multiplanar  CT image reconstructions were also generated. RADIATION DOSE REDUCTION: This exam was performed according to the departmental dose-optimization program which includes automated exposure control, adjustment of the mA and/or kV according to patient size and/or use of iterative reconstruction technique. COMPARISON:  Lumbar MRI 01/07/2022. CT Abdomen and Pelvis 07/21/2021. FINDINGS: Segmentation: Normal. Alignment: Stable lumbar lordosis. Vertebrae: Pronounced heterogeneity of bone mineralization since February this year. Diffuse lytic vertebral and visible pelvic bone lesions. Mild pathologic compression fracture of L1. More moderate pathologic compression fracture of L2, with lucency of the anterior superior endplate and 49% central loss of vertebral body height compared to February. No retropulsion of bone. L2 pedicles and posterior elements appear intact. Augmented moderate to severe L3 pathologic compression fracture. No significant L4 vertebral compression. Subtle L5 pathologic vertebral compression. Visible sacral ala appear intact despite myelomas involvement. Paraspinal and other soft tissues:  Negative visible noncontrast abdominal viscera. Lumbar paraspinal soft tissues remain within normal limits. Disc levels: Lumbar spine degeneration predominantly moderate to severe facet arthropathy at L4-L5 where there is subtle chronic grade 1 anterolisthesis. IMPRESSION: 1. Diffuse Multiple Myeloma involvement of the visible spine and pelvis, new since a February CT. 2. Augmented compression fracture of L3. Mild-to-moderate pathologic compression fracture of L2 (22% loss of vertebral body height, no complicating features). Mild pathologic compression fracture of L1. Electronically Signed   By: Genevie Ann M.D.   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 Low back pain. Status post recent L3 vertebral body  biopsy, RF ablation, augmentation on 02/05/2022. Pathology revealed plasmacytoma. Neck pain. EXAM: CT CERVICAL SPINE WITHOUT CONTRAST TECHNIQUE: Multidetector CT imaging of the cervical spine was performed without intravenous contrast. Multiplanar CT image reconstructions were also generated. RADIATION DOSE REDUCTION: This exam was performed according to the departmental dose-optimization program which includes automated exposure control, adjustment of the mA and/or kV according to patient size and/or use of iterative reconstruction technique. COMPARISON:  Cervical spine radiographs 12/18/2021. FINDINGS: Alignment: Straightening of cervical lordosis. Cervicothoracic junction alignment is within normal limits. Bilateral posterior element alignment is within normal limits. Skull base and vertebrae: Visualized skull base is intact. No atlanto-occipital dissociation. C1 and C2 appear intact and aligned. Widely scattered ill-defined lucent lesions are present elsewhere in the cervical spine and visible upper thoracic spine C3 through T2. No cervical pathologic fracture is identified. Posterior element involvement is noted on the right at C4 (series 4, image 44). No expansile cervical lesion at this time. Soft tissues and spinal canal: No prevertebral fluid or swelling. No visible canal hematoma. Negative noncontrast visible neck soft tissues. Disc levels: Widespread superimposed cervical spine disc and endplate degeneration. Multilevel degenerative cervical spinal stenosis, mild to moderate at C5-C6. Upper chest: T1 and T2 vertebral myeloma thus involvement, including an indistinct 10 mm lucent lesion of the left T1 body. Lung apices are clear. Negative visible noncontrast superior mediastinum. Other: Negative visible noncontrast posterior fossa. Visible paranasal sinuses, tympanic cavities and mastoids are well aerated. IMPRESSION: 1. Widespread cervical and visible upper thoracic vertebral involvement by Multiple  Myeloma. No cervical pathologic fracture. 2. Superimposed bulky cervical spine degeneration. Subsequent mild to moderate degenerative spinal stenosis suspected at C5-C6. Electronically Signed   By: Genevie Ann M.D.   On: 02/17/2022 09:41

## 2022-02-18 NOTE — Evaluation (Signed)
Physical Therapy Evaluation Patient Details Name: [name] MRN: [id number] DOB: December 23, 1970 Today's Date: 02/18/2022  History of Present Illness  51 yo male presents [facility] with fall, + head trauma. CT scan of the head and cervical spine noted widespread cervical and visible upper thoracic vertebral involvement by multiple myeloma without any focal cervical fracture; L1-2 compression fx. X-rays of the left shoulder revealed oblique displaced fracture of the proximal left humeral shaft with associated lucent lesion concerning for pathologic compression fracture. PMH includes kyphoplasty for L3 compression fx on 9/13, hypertension, hyperlipidemia, GERD, tobacco abuse, and recent diagnosis of multiple myeloma with LUE lesions.  Clinical Impression   Pt presents with severe back and LUE pain, impaired activity tolerance, difficulty mobilizing, and generalized weakness. Pt to benefit from acute PT to address deficits. Pt requiring mod +2 assist for bed-level mobility at this time, could not fully come to EOB secondary to severe back pain. PT hopeful pt will progress well with continued pain control and time, recommending HHPT and support of girlfriend once d/c.  PT to progress mobility as tolerated, and will continue to follow acutely.         Recommendations for follow up therapy are one component of a multi-disciplinary discharge planning process, led by the attending physician.  Recommendations may be updated based on patient status, additional functional criteria and insurance authorization.  Follow Up Recommendations Home health PT      Assistance Recommended at Discharge Frequent or constant Supervision/Assistance  Patient can return home with the following  A lot of help with walking and/or transfers;A lot of help with bathing/dressing/bathroom    Equipment Recommendations Rolling walker (2 wheels);BSC/3in1  Recommendations for Other Services       Functional Status  Assessment Patient has had a recent decline in their functional status and demonstrates the ability to make significant improvements in function in a reasonable and predictable amount of time.     Precautions / Restrictions Precautions Precautions: Back;Fall Required Braces or Orthoses: Spinal Brace;Sling Spinal Brace: Thoracolumbosacral orthotic;Applied in sitting position (no TLSO delivered to room yet, RN ordering) Restrictions Weight Bearing Restrictions: Yes LUE Weight Bearing: Non weight bearing Other Position/Activity Restrictions: ok for L elbow flexion/extension exercises per secure chat with Ortho PA Silvestre Gunner      Mobility  Bed Mobility Overal bed mobility: Needs Assistance Bed Mobility: Rolling, Sidelying to Sit, Sit to Sidelying Rolling: Mod assist, +2 for physical assistance Sidelying to sit: Mod assist, +2 for physical assistance, HOB elevated     Sit to sidelying: Mod assist, +2 for physical assistance General bed mobility comments: cues for logroll technique. Pain a limiting factor. physical assist of 2 needed this session.    Transfers                   General transfer comment: unable to assess. Needs TLSO for OOB. Also, pt only tolerated EOB for a few seconds before lying back down.    Ambulation/Gait                  Stairs            Wheelchair Mobility    Modified Rankin (Stroke Patients Only)       Balance Overall balance assessment: Needs assistance Sitting-balance support: Single extremity supported, Feet supported Sitting balance-Leahy Scale: Fair  Pertinent Vitals/Pain Pain Assessment Pain Assessment: Faces Faces Pain Scale: Hurts whole lot Pain Location: LUE and back with bed mobility Pain Descriptors / Indicators: Moaning, Jabbing, Guarding, Sharp Pain Intervention(s): Limited activity within patient's tolerance, Monitored during session, Repositioned     Home Living Family/patient expects to be discharged to:: Private residence Living Arrangements: Alone Available Help at Discharge: Friend(s);Available PRN/intermittently Type of Home: Apartment Home Access: Stairs to enter Entrance Stairs-Rails: Right (wall on left side) Entrance Stairs-Number of Steps: 4   Home Layout: One level Home Equipment: None      Prior Function Prior Level of Function : Independent/Modified Independent                     Hand Dominance   Dominant Hand: Right    Extremity/Trunk Assessment   Upper Extremity Assessment Upper Extremity Assessment: LUE deficits/detail LUE Deficits / Details: LUE immobilized in sling LUE: Unable to fully assess due to immobilization    Lower Extremity Assessment Lower Extremity Assessment: Generalized weakness    Cervical / Trunk Assessment Cervical / Trunk Assessment: Other exceptions Cervical / Trunk Exceptions: back pain and guarding, body habitus  Communication   Communication: No difficulties  Cognition Arousal/Alertness: Awake/alert Behavior During Therapy: WFL for tasks assessed/performed Overall Cognitive Status: Within Functional Limits for tasks assessed                                          General Comments      Exercises     Assessment/Plan    PT Assessment Patient needs continued PT services  PT Problem List Decreased strength;Decreased mobility;Decreased activity tolerance;Decreased balance;Decreased knowledge of use of DME;Pain;Decreased safety awareness       PT Treatment Interventions DME instruction;Therapeutic activities;Gait training;Therapeutic exercise;Patient/family education;Balance training;Stair training;Functional mobility training;Neuromuscular re-education    PT Goals (Current goals can be found in the Care Plan section)  Acute Rehab PT Goals PT Goal Formulation: With patient Time For Goal Achievement: 03/04/22 Potential to Achieve Goals:  Good    Frequency Min 3X/week     Co-evaluation PT/OT/SLP Co-Evaluation/Treatment: Yes Reason for Co-Treatment: For patient/therapist safety;To address functional/ADL transfers PT goals addressed during session: Mobility/safety with mobility;Balance OT goals addressed during session: ADL's and self-care;Strengthening/ROM       AM-PAC PT "6 Clicks" Mobility  Outcome Measure Help needed turning from your back to your side while in a flat bed without using bedrails?: A Little Help needed moving from lying on your back to sitting on the side of a flat bed without using bedrails?: A Little Help needed moving to and from a bed to a chair (including a wheelchair)?: A Little Help needed standing up from a chair using your arms (e.g., wheelchair or bedside chair)?: A Little Help needed to walk in hospital room?: A Little Help needed climbing 3-5 steps with a railing? : A Little 6 Click Score: 18    End of Session Equipment Utilized During Treatment: Back brace (lumbar corset removed as pt was in bed with it donned) Activity Tolerance: Patient limited by pain Patient left: in bed;with call bell/phone within reach Nurse Communication: Mobility status PT Visit Diagnosis: Other abnormalities of gait and mobility (R26.89);Muscle weakness (generalized) (M62.81)    Time: 0263-7858 PT Time Calculation (min) (ACUTE ONLY): 26 min   Charges:   PT Evaluation $PT Eval Low Complexity: 1 Low  Stacie Glaze, PT DPT Acute Rehabilitation Services Pager [phone]  Office [phone]   Louis Matte 02/18/2022, 4:24 PM

## 2022-02-18 NOTE — Telephone Encounter (Signed)
Oral Oncology Pharmacist Encounter  Received new prescription for Revlimid (lenalidomide) for the treatment of multiple myeloma in conjunction with bortezomib and dexamethasone, planned duration until disease progression or unacceptable drug toxicity.  CBC w/ Diff and CMP from 02/17/22 assessed, no relevant lab abnormalities requiring baseline dose adjustment required at this time. Prescription dose and frequency assessed for appropriateness.   Current medication list in Epic reviewed, no relevant/significant DDIs with Revlimid identified.  Evaluated chart and no patient barriers to medication adherence noted.   Prescription has been e-scribed to [facility] due to limited distribution nature of medication. Per [facility], this is scheduled to deliver to patient's home on 02/19/22. Patient knows not to start until first day of treatment in clinic.   Oral Oncology Clinic will continue to follow for initial counseling and start date.  Leron Croak, PharmD, BCPS, BCOP Hematology/Oncology Clinical Pharmacist [facility] [place] [phone] 02/18/2022 2:09 PM

## 2022-02-18 NOTE — Progress Notes (Signed)
Orthopedic Tech Progress Note Patient Details:  [name] July 24, 1970 [id number]  The ortho tech demonstrated to the pt how the TLSO was fitted and the level of support it would bring to the pt. Pt was in bed when the ortho techs came, so the TLSO brace was not applied since it is to be worn when OOB. Brace was left at pt's beside   Ortho Devices Type of Ortho Device: Thoracolumbar corset (TLSO) Ortho Device/Splint Location: BACK Ortho Device/Splint Interventions: Ordered   Post Interventions Patient Tolerated: Well Instructions Provided: Care of device, Adjustment of device  Arville Go 02/18/2022, 12:49 PM

## 2022-02-18 NOTE — Telephone Encounter (Signed)
Per workque called and spoke to pt about appointments  

## 2022-02-18 NOTE — Consult Note (Addendum)
Reason for Consult:Left humerus fx Referring Physician: Lala Lund Time called: 0730 Time at bedside: 0848   [name] is an 51 y.o. male.  HPI: [place] was at home yesterday. He got up to go to the bathroom, had acute back pain, and began to fall. He tried to catch himself with his left arm, had acute severe upper arm pain, and fell the rest of the way. He was brought to the ED where x-rays showed a proximal pathologic humerus fx and orthopedic surgery was consulted the following morning. He is undergoing treatment for MM and has had other spinal pathologic fxs. He is RHD and was not using any assistive devices to ambulate.  He c/o pain with movement in the left UE.  Pain in his back is worse.  Past Medical History:  Diagnosis Date   Allergy    Atypical chest pain    GERD (gastroesophageal reflux disease)    Hepatic cyst    Benign by MRI   Hiatal hernia    History of colonic polyps    Hyperlipidemia    Hypertension    Rectal bleeding    Tobacco abuse     Past Surgical History:  Procedure Laterality Date   COLONOSCOPY  01/10/2009     RMR: Normal rectum, normal colon; repeat in 2015 due to [place] of colon cancer   COLONOSCOPY N/A 01/09/2014   QMG:NOIBBCWU colonic polyps-removed as described   COLONOSCOPY N/A 09/18/2016   Procedure: COLONOSCOPY;  Surgeon: Daneil Dolin, MD;  [facility];  Service: Endoscopy;  Laterality: N/A;  730    ESOPHAGOGASTRODUODENOSCOPY  10/04/2007   RMR: Distal esophageal erosions consistent with erosive reflux esophagitis, patulous gastroesophageal junction status post passage of a  Mal[place] dilator, 76 [place].  Otherwise, unremarkable esophagus.  Hiatal hernia.  Otherwise normal stomach.  Bulbar erosion   ESOPHAGOGASTRODUODENOSCOPY N/A 01/09/2014   Erosive reflux esophagitis. Small hiatal hernia   I & D EXTREMITY Left 07/26/2018   Procedure: IRRIGATION AND DEBRIDEMENT EXTREMITY;  Surgeon: Dayna Barker, MD;  Location: [facility];  Service:  Plastics;  Laterality: Left;   IR BONE TUMOR(S)RF ABLATION  02/05/2022   IR KYPHO LUMBAR INC FX REDUCE BONE BX UNI/BIL CANNULATION INC/IMAGING  02/05/2022   PERCUTANEOUS PINNING Left 07/26/2018   Procedure: PERCUTANEOUS PINNING EXTREMITY;  Surgeon: Dayna Barker, MD;  Location: [facility];  Service: Plastics;  Laterality: Left;    Family History  Problem Relation Age of Onset   Heart disease Mother    Hypertension Mother    Diabetes Father    Hypertension Father    Colon cancer Sister        passed away from colon cancer, in her 94s   Heart attack Maternal Uncle    Prostate cancer Maternal Uncle    Diabetes Other    Pancreatic cancer Neg Hx    Rectal cancer Neg Hx    Esophageal cancer Neg Hx    Stomach cancer Neg Hx     Social History:  reports that he has been smoking cigarettes. He has a 2.50 pack-year smoking history. He has never used smokeless tobacco. He reports current alcohol use. He reports that he does not use drugs.  Allergies: No Known Allergies  Medications: I have reviewed the patient's current medications.  Results for orders placed or performed during the hospital encounter of 02/17/22 (from the past 48 hour(s))  CBC with Differential     Status: Abnormal   Collection Time: 02/17/22  5:20 AM  Result  Value Ref Range   WBC 7.7 4.0 - 10.5 K/uL   RBC 3.88 (L) 4.22 - 5.81 MIL/uL   Hemoglobin 11.3 (L) 13.0 - 17.0 g/dL   HCT 34.7 (L) 39.0 - 52.0 %   MCV 89.4 80.0 - 100.0 fL   MCH 29.1 26.0 - 34.0 pg   MCHC 32.6 30.0 - 36.0 g/dL   RDW 13.0 11.5 - 15.5 %   Platelets 213 150 - 400 K/uL   nRBC 0.0 0.0 - 0.2 %   Neutrophils Relative % 62 %   Neutro Abs 4.7 1.7 - 7.7 K/uL   Lymphocytes Relative 30 %   Lymphs Abs 2.3 0.7 - 4.0 K/uL   Monocytes Relative 6 %   Monocytes Absolute 0.5 0.1 - 1.0 K/uL   Eosinophils Relative 1 %   Eosinophils Absolute 0.0 0.0 - 0.5 K/uL   Basophils Relative 0 %   Basophils Absolute 0.0 0.0 - 0.1 K/uL   Immature Granulocytes 1 %   Abs  Immature Granulocytes 0.10 (H) 0.00 - 0.07 K/uL    Comment: Performed at [facility], [address], [place], [place] [address]  Comprehensive metabolic panel     Status: Abnormal   Collection Time: 02/17/22  5:20 AM  Result Value Ref Range   Sodium 133 (L) 135 - 145 mmol/L   Potassium 4.0 3.5 - 5.1 mmol/L   Chloride 103 98 - 111 mmol/L   CO2 21 (L) 22 - 32 mmol/L   Glucose, Bld 116 (H) 70 - 99 mg/dL    Comment: Glucose reference range applies only to samples taken after fasting for at least 8 hours.   BUN 19 6 - 20 mg/dL   Creatinine, Ser 1.17 0.61 - 1.24 mg/dL   Calcium 9.6 8.9 - 10.3 mg/dL   Total Protein 9.4 (H) 6.5 - 8.1 g/dL   Albumin 2.9 (L) 3.5 - 5.0 g/dL   AST 30 15 - 41 U/L   ALT 33 0 - 44 U/L   Alkaline Phosphatase 47 38 - 126 U/L   Total Bilirubin 0.5 0.3 - 1.2 mg/dL   GFR, Estimated >60 >60 mL/min    Comment: (NOTE) Calculated using the CKD-EPI Creatinine Equation (2021)    Anion gap 9 5 - 15    Comment: Performed at [facility], [place] [address], [place], [place] [address]  Protime-INR     Status: None   Collection Time: 02/17/22  5:20 AM  Result Value Ref Range   Prothrombin Time 13.5 11.4 - 15.2 seconds   INR 1.0 0.8 - 1.2    Comment: (NOTE) INR goal varies based on device and disease states. Performed at [facility], [place] [address], [place], [place] [address]   HIV Antibody (routine testing w rflx)     Status: None   Collection Time: 02/17/22  3:46 PM  Result Value Ref Range   HIV Screen 4th Generation wRfx Non Reactive Non Reactive    Comment: Performed at [facility], [place] [address], [place], [place] [address]  Hemoglobin and hematocrit, blood     Status: Abnormal   Collection Time: 02/17/22  3:46 PM  Result Value Ref Range   Hemoglobin 11.6 (L) 13.0 - 17.0 g/dL   HCT 35.9 (L) 39.0 - 52.0 %    Comment: Performed at [facility], [place] [address] [place], [place] [address]  CBC     Status: Abnormal   Collection Time:  02/18/22  4:41  AM  Result Value Ref Range   WBC 7.9 4.0 - 10.5 K/uL   RBC 4.00 (L) 4.22 - 5.81 MIL/uL   Hemoglobin 11.6 (L) 13.0 - 17.0 g/dL   HCT 35.7 (L) 39.0 - 52.0 %   MCV 89.3 80.0 - 100.0 fL   MCH 29.0 26.0 - 34.0 pg   MCHC 32.5 30.0 - 36.0 g/dL   RDW 13.0 11.5 - 15.5 %   Platelets 229 150 - 400 K/uL   nRBC 0.0 0.0 - 0.2 %    Comment: Performed at [facility], [place] [address] [place], [place] [address]  Basic metabolic panel     Status: Abnormal   Collection Time: 02/18/22  4:41 AM  Result Value Ref Range   Sodium 132 (L) 135 - 145 mmol/L   Potassium 4.0 3.5 - 5.1 mmol/L   Chloride 104 98 - 111 mmol/L   CO2 25 22 - 32 mmol/L   Glucose, Bld 103 (H) 70 - 99 mg/dL    Comment: Glucose reference range applies only to samples taken after fasting for at least 8 hours.   BUN 11 6 - 20 mg/dL   Creatinine, Ser 0.93 0.61 - 1.24 mg/dL   Calcium 9.5 8.9 - 10.3 mg/dL   GFR, Estimated >60 >60 mL/min    Comment: (NOTE) Calculated using the CKD-EPI Creatinine Equation (2021)    Anion gap 3 (L) 5 - 15    Comment: Performed at [facility] [address], [place], [place] [address]    DG Shoulder Left  Result Date: 02/17/2022 CLINICAL DATA:  Fall EXAM: LEFT SHOULDER - 3 VIEW COMPARISON:  Shoulder radiograph dated February 04, 2022 FINDINGS: Oblique displaced fracture of the proximal left humeral shaft. Associated lucent lesion of the proximal left humerus is again seen. Associated soft tissue edema of the proximal left arm. Visualized lung is clear. IMPRESSION: Oblique displaced fracture of the proximal left humeral shaft with associated lucent lesion, findings are concerning for pathologic compression fracture. Electronically Signed   By: Yetta Glassman M.D.   On: 02/17/2022 13:13   DG Pelvis Portable  Result Date: 02/17/2022 CLINICAL DATA:  Fall. EXAM: PORTABLE PELVIS 1-2 VIEWS COMPARISON:  CT abdomen and pelvis 07/21/2021 FINDINGS: No acute fracture or pelvic diastasis  is identified. There is mild left hip joint space narrowing. IMPRESSION: No acute osseous abnormality identified. Electronically Signed   By: Logan Bores M.D.   On: 02/17/2022 13:09   DG Chest Portable 1 View  Result Date: 02/17/2022 CLINICAL DATA:  Fall. EXAM: PORTABLE CHEST 1 VIEW COMPARISON:  Chest radiographs 05/04/2019 FINDINGS: The cardiomediastinal silhouette is unchanged with normal heart size. Lung volumes are low with bronchovascular crowding. No confluent airspace opacity, overt pulmonary edema, sizable pleural effusion, or pneumothorax is identified. No acute osseous abnormality is seen. IMPRESSION: No active disease. Electronically Signed   By: Logan Bores M.D.   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 Low back pain. Status post recent L3 vertebral body biopsy, RF ablation, augmentation on 02/05/2022. Pathology revealed plasmacytoma. EXAM: CT LUMBAR SPINE WITHOUT CONTRAST TECHNIQUE: Multidetector CT imaging of the lumbar spine was performed without intravenous contrast administration. Multiplanar CT image reconstructions were also generated. RADIATION DOSE REDUCTION: This exam was performed according to the departmental dose-optimization program which includes automated exposure control, adjustment of the mA and/or kV according to patient size and/or use of iterative reconstruction  technique. COMPARISON:  Lumbar MRI 01/07/2022. CT Abdomen and Pelvis 07/21/2021. FINDINGS: Segmentation: Normal. Alignment: Stable lumbar lordosis. Vertebrae: Pronounced heterogeneity of bone mineralization since February this year. Diffuse lytic vertebral and visible pelvic bone lesions. Mild pathologic compression fracture of L1. More moderate pathologic compression fracture of L2, with lucency of the anterior superior endplate and 62% central loss of vertebral body height compared to February. No  retropulsion of bone. L2 pedicles and posterior elements appear intact. Augmented moderate to severe L3 pathologic compression fracture. No significant L4 vertebral compression. Subtle L5 pathologic vertebral compression. Visible sacral ala appear intact despite myelomas involvement. Paraspinal and other soft tissues: Negative visible noncontrast abdominal viscera. Lumbar paraspinal soft tissues remain within normal limits. Disc levels: Lumbar spine degeneration predominantly moderate to severe facet arthropathy at L4-L5 where there is subtle chronic grade 1 anterolisthesis. IMPRESSION: 1. Diffuse Multiple Myeloma involvement of the visible spine and pelvis, new since a February CT. 2. Augmented compression fracture of L3. Mild-to-moderate pathologic compression fracture of L2 (22% loss of vertebral body height, no complicating features). Mild pathologic compression fracture of L1. Electronically Signed   By: Genevie Ann M.D.   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 Low back pain. Status post recent L3 vertebral body biopsy, RF ablation, augmentation on 02/05/2022. Pathology revealed plasmacytoma. Neck pain. EXAM: CT CERVICAL SPINE WITHOUT CONTRAST TECHNIQUE: Multidetector CT imaging of the cervical spine was performed without intravenous contrast. Multiplanar CT image reconstructions were also generated. RADIATION DOSE REDUCTION: This exam was performed according to the departmental dose-optimization program which includes automated exposure control, adjustment of the mA and/or kV according to patient size and/or use of iterative reconstruction technique. COMPARISON:  Cervical spine radiographs 12/18/2021. FINDINGS: Alignment: Straightening of cervical lordosis. Cervicothoracic junction alignment is within normal limits. Bilateral posterior element alignment is within normal limits. Skull base and  vertebrae: Visualized skull base is intact. No atlanto-occipital dissociation. C1 and C2 appear intact and aligned. Widely scattered ill-defined lucent lesions are present elsewhere in the cervical spine and visible upper thoracic spine C3 through T2. No cervical pathologic fracture is identified. Posterior element involvement is noted on the right at C4 (series 4, image 44). No expansile cervical lesion at this time. Soft tissues and spinal canal: No prevertebral fluid or swelling. No visible canal hematoma. Negative noncontrast visible neck soft tissues. Disc levels: Widespread superimposed cervical spine disc and endplate degeneration. Multilevel degenerative cervical spinal stenosis, mild to moderate at C5-C6. Upper chest: T1 and T2 vertebral myeloma thus involvement, including an indistinct 10 mm lucent lesion of the left T1 body. Lung apices are clear. Negative visible noncontrast superior mediastinum. Other: Negative visible noncontrast posterior fossa. Visible paranasal sinuses, tympanic cavities and mastoids are well aerated. IMPRESSION: 1. Widespread cervical and visible upper thoracic vertebral involvement by Multiple Myeloma. No cervical pathologic fracture. 2. Superimposed bulky cervical spine degeneration. Subsequent mild to moderate degenerative spinal stenosis suspected at C5-C6. Electronically Signed   By: Genevie Ann M.D.   On: 02/17/2022 09:41    Review of Systems  HENT:  Negative for ear discharge, ear pain, hearing loss and tinnitus.   Eyes:  Negative for photophobia and pain.  Respiratory:  Negative for cough and shortness of breath.   Cardiovascular:  Negative for chest pain.  Gastrointestinal:  Negative for abdominal pain, nausea and vomiting.  Genitourinary:  Negative for dysuria, flank pain, frequency and urgency.  Musculoskeletal:  Positive  for arthralgias (Left shoulder) and back pain. Negative for myalgias and neck pain.  Neurological:  Negative for dizziness and headaches.   Hematological:  Does not bruise/bleed easily.  Psychiatric/Behavioral:  The patient is not nervous/anxious.    Blood pressure (!) 135/90, pulse 87, temperature 98.2 F (36.8 C), temperature source Oral, resp. rate 15, SpO2 98 %. Physical Exam Constitutional:      General: He is not in acute distress.    Appearance: He is well-developed. He is not diaphoretic.  HENT:     Head: Normocephalic and atraumatic.  Eyes:     General: No scleral icterus.       Right eye: No discharge.        Left eye: No discharge.     Conjunctiva/sclera: Conjunctivae normal.  Cardiovascular:     Rate and Rhythm: Normal rate and regular rhythm.  Pulmonary:     Effort: Pulmonary effort is normal. No respiratory distress.  Musculoskeletal:     Cervical back: Normal range of motion.     Comments: Left shoulder, elbow, wrist, digits- no skin wounds, mod TTP shoudler, no instability, no blocks to motion  Sens  Ax/R/M/U intact  Mot   Ax/ R/ PIN/ M/ AIN/ U intact  Rad 2+  Skin:    General: Skin is warm and dry.  Neurological:     Mental Status: He is alert.  Psychiatric:        Mood and Affect: Mood normal.        Behavior: Behavior normal.     Assessment/Plan: Left humerus fx -- Will need ORIF at some point. Would like to do electively provided he can be discharged. For now sling and NWB LUE. We will see how he progresses in terms of his back.    Lisette Abu, PA-C Orthopedic Surgery [phone] 02/18/2022, 8:57 AM   Pt seen and examined.  Agree with findings documented above.  I discussed both surgical and non surgical treatment of his L humerus fracture.  He is safe to discharge home and follow up with Dr. Stann Mainland in clinic in the next week or so.  NWB L UE.  Sling for comfort.  OK to remove to shower.

## 2022-02-18 NOTE — Evaluation (Addendum)
Occupational Therapy Evaluation Patient Details Name: [name] MRN: [id number] DOB: 1971-02-04 Today's Date: 02/18/2022   History of Present Illness 51 yo male [facility] with fall, + head trauma. CT scan of the head and cervical spine noted widespread cervical and visible upper thoracic vertebral involvement by multiple myeloma without any focal cervical fracture; L1-2 compression fx. X-rays of the left shoulder revealed oblique displaced fracture of the proximal left humeral shaft with associated lucent lesion concerning for pathologic compression fracture. PMH includes kyphoplasty for L3 compression fx on 9/13, hypertension, hyperlipidemia, GERD, tobacco abuse, and recent diagnosis of multiple myeloma with LUE lesions.   Clinical Impression   Pt admitted with the above diagnoses and presents with below problem list. Pt will benefit from continued acute OT to address the below listed deficits and maximize independence with basic ADLs prior to d/c to next venue. At baseline. Pt is independent with ADLs. LUE and back pain limiting session but pt was able to briefly achieve EOB position; bed mobility +2 mod A.Pt currently needs up to mod A with UB ADLs and up to max A +2 for LB ADLs. Pain a limiting factor. Pt lives alone, girlfriend able to assist but pt will need to be at +1 assist level to d/c home otherwise may need to consider ST SNF for continued rehab.       Recommendations for follow up therapy are one component of a multi-disciplinary discharge planning process, led by the attending physician.  Recommendations may be updated based on patient status, additional functional criteria and insurance authorization.   Follow Up Recommendations  Home health OT pending progress   Assistance Recommended at Discharge Frequent or constant Supervision/Assistance  Patient can return home with the following Two people to help with walking and/or transfers;Two people to help with  bathing/dressing/bathroom;Assist for transportation;Help with stairs or ramp for entrance    Functional Status Assessment  Patient has had a recent decline in their functional status and demonstrates the ability to make significant improvements in function in a reasonable and predictable amount of time.  Equipment Recommendations  Other (comment) (TBD)    Recommendations for Other Services       Precautions / Restrictions Precautions Precautions: Back;Fall Required Braces or Orthoses: Spinal Brace;Sling Spinal Brace: Thoracolumbosacral orthotic;Applied in sitting position Restrictions Weight Bearing Restrictions: Yes LUE Weight Bearing: Non weight bearing Other Position/Activity Restrictions: ok for L elbow flexion/extension exercises per secure chat with Ortho PA Silvestre Gunner.      Mobility Bed Mobility Overal bed mobility: Needs Assistance Bed Mobility: Rolling, Sidelying to Sit, Sit to Sidelying Rolling: Mod assist, +2 for physical assistance Sidelying to sit: Mod assist, +2 for physical assistance, HOB elevated     Sit to sidelying: Mod assist, +2 for physical assistance General bed mobility comments: cues for logroll technique. Pain a limiting factor. physical assist of 2 needed this session.    Transfers                   General transfer comment: unable to assess. Needs TLSO for OOB. Also, pt only tolerated EOB for a few seconds before lying back down.      Balance Overall balance assessment: Needs assistance Sitting-balance support: Single extremity supported, Feet supported Sitting balance-Leahy Scale: Poor Sitting balance - Comments: pain a limiting factor. assist provided in static sitting  ADL either performed or assessed with clinical judgement   ADL Overall ADL's : Needs assistance/impaired Eating/Feeding: Set up;Sitting;Bed level   Grooming: Moderate assistance;Sitting;Bed level   Upper Body  Bathing: Maximal assistance;Bed level Upper Body Bathing Details (indicate cue type and reason): attempted while EOB but pt needed to lie down 2/2 increased pain in sitting position Lower Body Bathing: Moderate assistance;Bed level;+2 for safety/equipment   Upper Body Dressing : Maximal assistance;Bed level;Sitting   Lower Body Dressing: Maximal assistance;+2 for safety/equipment;Bed level                 General ADL Comments: Pt completed bed mobility to remove lumbar corset (on pt upon arrival of OT/PT). Attempted to sit EOB but pt with increased pain moments after achieving EOB and needed to return to sidelying. Bed level bathing tasks compelted. +2 for bed mobility. Discussed back order for TLSO (per Candiss Norse) and wearing schedule.     Vision         Perception     Praxis      Pertinent Vitals/Pain Pain Assessment Pain Assessment: Faces Faces Pain Scale: Hurts worst Pain Location: LUE and back with bed mobility Pain Descriptors / Indicators: Moaning, Jabbing, Guarding Pain Intervention(s): Limited activity within patient's tolerance, Premedicated before session, Monitored during session, Repositioned     Hand Dominance Right   Extremity/Trunk Assessment Upper Extremity Assessment Upper Extremity Assessment: LUE deficits/detail LUE Deficits / Details: LUE immobilized in sling. Pt was able to complete elbow extension for UB bathing in supine. LUE: Unable to fully assess due to immobilization   Lower Extremity Assessment Lower Extremity Assessment: Defer to PT evaluation   Cervical / Trunk Assessment Cervical / Trunk Assessment: Other exceptions   Communication Communication Communication: No difficulties   Cognition Arousal/Alertness: Awake/alert Behavior During Therapy: WFL for tasks assessed/performed Overall Cognitive Status: Within Functional Limits for tasks assessed                                       General Comments       Exercises      Shoulder Instructions      Home Living Family/patient expects to be discharged to:: Private residence Living Arrangements: Alone Available Help at Discharge: Friend(s);Available PRN/intermittently Type of Home: Apartment Home Access: Stairs to enter Entrance Stairs-Number of Steps: 4 Entrance Stairs-Rails: Right (wall on left side) Home Layout: One level     Bathroom Shower/Tub: Tub/shower unit         Home Equipment: None          Prior Functioning/Environment Prior Level of Function : Independent/Modified Independent                        OT Problem List: Decreased strength;Decreased activity tolerance;Impaired balance (sitting and/or standing);Decreased knowledge of use of DME or AE;Decreased knowledge of precautions;Impaired UE functional use;Pain      OT Treatment/Interventions: Self-care/ADL training;Therapeutic exercise;DME and/or AE instruction;Therapeutic activities;Patient/family education;Balance training    OT Goals(Current goals can be found in the care plan section) Acute Rehab OT Goals Patient Stated Goal: reduce pain, regain independence OT Goal Formulation: With patient Time For Goal Achievement: 03/04/22 Potential to Achieve Goals: Good  OT Frequency: Min 2X/week    Co-evaluation PT/OT/SLP Co-Evaluation/Treatment: Yes Reason for Co-Treatment: For patient/therapist safety;To address functional/ADL transfers   OT goals addressed during session: ADL's and self-care;Strengthening/ROM  AM-PAC OT "6 Clicks" Daily Activity     Outcome Measure Help from another person eating meals?: A Little Help from another person taking care of personal grooming?: A Little Help from another person toileting, which includes using toliet, bedpan, or urinal?: Total Help from another person bathing (including washing, rinsing, drying)?: A Lot Help from another person to put on and taking off regular upper body clothing?: A Lot Help from another person  to put on and taking off regular lower body clothing?: Total 6 Click Score: 12   End of Session Equipment Utilized During Treatment: Other (comment) (sling)  Activity Tolerance: Patient limited by pain Patient left: in bed;with call bell/phone within reach  OT Visit Diagnosis: Unsteadiness on feet (R26.81);Muscle weakness (generalized) (M62.81);History of falling (Z91.81);Pain                Time: 9847-3085 OT Time Calculation (min): 23 min Charges:  OT General Charges $OT Visit: 1 Visit OT Treatments $Self Care/Home Management : 8-22 mins  Tyrone Schimke, OT Acute Rehabilitation Services  Office: [phone]   Hortencia Pilar 02/18/2022, 1:53 PM

## 2022-02-18 NOTE — Progress Notes (Signed)
Request to IR for L1 - L2 KP, patient reviewed by Dr Tennis Must Sindy Messing who approves patient for L1 and L2 RFA kyphoplasty. Per IR staff this does not require prior authorization with insurance company as it falls under inpatient stay.  Will tentatively plan on procedure tomorrow (9/27) pending IR schedule/emergent procedures. Patient received Lovenox 40 mg today at 15:19, will discuss with performing MD tomorrow if they are agreeable to proceed despite < 24 hour hold. Hold order placed in [facility] for Lovenox dose on 9/27.  Plan: - NPO at midnight - Hold further anticoagulation until post procedure - CBC/INR AM of 9/27 - IR APP will see patient for consult/consent on 9/27  Please call with questions or concerns.  Candiss Norse, PA-C

## 2022-02-19 ENCOUNTER — Encounter (HOSPITAL_COMMUNITY): Payer: Self-pay | Admitting: Internal Medicine

## 2022-02-19 ENCOUNTER — Inpatient Hospital Stay (HOSPITAL_COMMUNITY): Payer: BC Managed Care – PPO

## 2022-02-19 ENCOUNTER — Encounter (HOSPITAL_COMMUNITY): Payer: Self-pay | Admitting: Physician Assistant

## 2022-02-19 DIAGNOSIS — S42332A Displaced oblique fracture of shaft of humerus, left arm, initial encounter for closed fracture: Secondary | ICD-10-CM | POA: Diagnosis not present

## 2022-02-19 DIAGNOSIS — S32020A Wedge compression fracture of second lumbar vertebra, initial encounter for closed fracture: Secondary | ICD-10-CM | POA: Diagnosis not present

## 2022-02-19 DIAGNOSIS — M84622A Pathological fracture in other disease, left humerus, initial encounter for fracture: Secondary | ICD-10-CM | POA: Diagnosis not present

## 2022-02-19 DIAGNOSIS — S32010A Wedge compression fracture of first lumbar vertebra, initial encounter for closed fracture: Secondary | ICD-10-CM | POA: Diagnosis not present

## 2022-02-19 HISTORY — PX: IR KYPHO EA ADDL LEVEL THORACIC OR LUMBAR: IMG5520

## 2022-02-19 HISTORY — PX: IR KYPHO LUMBAR INC FX REDUCE BONE BX UNI/BIL CANNULATION INC/IMAGING: IMG5519

## 2022-02-19 LAB — COMPREHENSIVE METABOLIC PANEL
ALT: 30 U/L (ref 0–44)
AST: 22 U/L (ref 15–41)
Albumin: 2.6 g/dL — ABNORMAL LOW (ref 3.5–5.0)
Alkaline Phosphatase: 46 U/L (ref 38–126)
Anion gap: 6 (ref 5–15)
BUN: 13 mg/dL (ref 6–20)
CO2: 24 mmol/L (ref 22–32)
Calcium: 8.8 mg/dL — ABNORMAL LOW (ref 8.9–10.3)
Chloride: 98 mmol/L (ref 98–111)
Creatinine, Ser: 1 mg/dL (ref 0.61–1.24)
GFR, Estimated: >60 mL/min (ref >60)
Glucose, Bld: 93 mg/dL (ref 70–99)
Potassium: 4.1 mmol/L (ref 3.5–5.1)
Sodium: 128 mmol/L — ABNORMAL LOW (ref 135–145)
Total Bilirubin: 0.8 mg/dL (ref 0.3–1.2)
Total Protein: 9.1 g/dL — ABNORMAL HIGH (ref 6.5–8.1)

## 2022-02-19 LAB — CBC WITH DIFFERENTIAL/PLATELET
Abs Immature Granulocytes: 0.06 10*3/uL (ref 0.00–0.07)
Basophils Absolute: 0 10*3/uL (ref 0.0–0.1)
Basophils Relative: 0 %
Eosinophils Absolute: 0.1 10*3/uL (ref 0.0–0.5)
Eosinophils Relative: 1 %
HCT: 32.5 % — ABNORMAL LOW (ref 39.0–52.0)
Hemoglobin: 10.9 g/dL — ABNORMAL LOW (ref 13.0–17.0)
Immature Granulocytes: 1 %
Lymphocytes Relative: 37 %
Lymphs Abs: 2.8 10*3/uL (ref 0.7–4.0)
MCH: 29.7 pg (ref 26.0–34.0)
MCHC: 33.5 g/dL (ref 30.0–36.0)
MCV: 88.6 fL (ref 80.0–100.0)
Monocytes Absolute: 0.5 10*3/uL (ref 0.1–1.0)
Monocytes Relative: 6 %
Neutro Abs: 4.2 10*3/uL (ref 1.7–7.7)
Neutrophils Relative %: 55 %
Platelets: 213 10*3/uL (ref 150–400)
RBC: 3.67 MIL/uL — ABNORMAL LOW (ref 4.22–5.81)
RDW: 13 % (ref 11.5–15.5)
WBC: 7.6 10*3/uL (ref 4.0–10.5)
nRBC: 0 % (ref 0.0–0.2)

## 2022-02-19 LAB — MAGNESIUM: Magnesium: 1.8 mg/dL (ref 1.7–2.4)

## 2022-02-19 LAB — BRAIN NATRIURETIC PEPTIDE: B Natriuretic Peptide: 25.8 pg/mL (ref 0.0–100.0)

## 2022-02-19 MED ORDER — BUPIVACAINE HCL (PF) 0.5 % IJ SOLN
INTRAMUSCULAR | Status: AC
  Filled 2022-02-19: qty 30

## 2022-02-19 MED ORDER — FENTANYL CITRATE (PF) 100 MCG/2ML IJ SOLN
INTRAMUSCULAR | Status: AC
  Filled 2022-02-19: qty 2

## 2022-02-19 MED ORDER — LIDOCAINE HCL (PF) 1 % IJ SOLN
INTRAMUSCULAR | Status: AC | PRN
  Administered 2022-02-19: 30 mL

## 2022-02-19 MED ORDER — LIDOCAINE HCL 1 % IJ SOLN
INTRAMUSCULAR | Status: AC
  Filled 2022-02-19: qty 20

## 2022-02-19 MED ORDER — HYDROMORPHONE HCL 1 MG/ML IJ SOLN
INTRAMUSCULAR | Status: AC
  Filled 2022-02-19: qty 2

## 2022-02-19 MED ORDER — CEFAZOLIN SODIUM-DEXTROSE 2-4 GM/100ML-% IV SOLN
INTRAVENOUS | Status: AC
  Administered 2022-02-19: 2 g via INTRAVENOUS
  Filled 2022-02-19: qty 100

## 2022-02-19 MED ORDER — FENTANYL CITRATE (PF) 100 MCG/2ML IJ SOLN
INTRAMUSCULAR | Status: AC
  Filled 2022-02-19: qty 6

## 2022-02-19 MED ORDER — FENTANYL CITRATE (PF) 100 MCG/2ML IJ SOLN
INTRAMUSCULAR | Status: AC | PRN
  Administered 2022-02-19 (×4): 50 ug via INTRAVENOUS

## 2022-02-19 MED ORDER — BUPIVACAINE HCL 0.25 % IJ SOLN
INTRAMUSCULAR | Status: AC | PRN
  Administered 2022-02-19: 30 mL

## 2022-02-19 MED ORDER — MIDAZOLAM HCL 2 MG/2ML IJ SOLN
INTRAMUSCULAR | Status: AC
  Filled 2022-02-19: qty 6

## 2022-02-19 MED ORDER — IOHEXOL 300 MG/ML  SOLN
50.0000 mL | Freq: Once | INTRAMUSCULAR | Status: AC | PRN
  Administered 2022-02-19: 30 mL

## 2022-02-19 MED ORDER — HYDROMORPHONE HCL 1 MG/ML IJ SOLN
INTRAMUSCULAR | Status: AC | PRN
  Administered 2022-02-19: 1 mg via INTRAVENOUS

## 2022-02-19 MED ORDER — MIDAZOLAM HCL 2 MG/2ML IJ SOLN
INTRAMUSCULAR | Status: AC | PRN
  Administered 2022-02-19 (×6): 1 mg via INTRAVENOUS

## 2022-02-19 NOTE — Progress Notes (Signed)
Mobility Specialist Progress Note:   02/19/22 1349  Mobility  Activity Off unit   Pt off unit at time of visit. Will f/u as able.   [name] Mobility Specialist-Acute Rehab Secure Chat only

## 2022-02-19 NOTE — Sedation Documentation (Signed)
Procedure complete. Patient alert/oriented. Denies any pain or discomfort.

## 2022-02-19 NOTE — Progress Notes (Signed)
PROGRESS NOTE                                                                                                                                                                                                             Patient Demographics:    [name], is a 51 y.o. male, DOB - 12-Oct-1970, GYI:[id number]  Outpatient Primary MD for the patient is [name], MD    LOS - 1  Admit date - 02/17/2022    Chief Complaint  Patient presents with   Fall       Brief Narrative (HPI from H&P)   51 y.o. male with medical history significant of hypertension, hyperlipidemia, GERD, tobacco abuse, and recent diagnosis of multiple myeloma followed by Dr. Lorenso Courier of oncology presents complaining of lower back and left arm pain.  He had gotten up to use the restroom early this morning after he had watched the football games.  He had some pain in his lower back upon sitting up and switching positions to stand up, but pain was sharp and severe causing him to fall.  He tried to brace himself with his left arm, but developed severe pain in the left shoulder.  Patient fell onto the bed and then into the floor.  The ER his work-up was suggestive of left humerus fracture which was acute, some extension of his old L3 compression fracture and new L1 and L2 compression fractures.  He had excruciating lower back pain pain and was admitted for further care.   Subjective:   Patient in bed, appears comfortable, denies any headache, no fever, no chest pain or pressure, no shortness of breath , no abdominal pain. No new focal weakness.   Assessment  & Plan :    Pathological proximal left humeral fracture, lumbar L1-L2 and extension of recent kyphoplasty L3 fracture in a patient with multiple myeloma.   He lives alone his left arm is in a sling due to fracture and is currently having excruciating low back pain and unable to get out of the bed, case  discussed with neurosurgeon Dr. Elwin Sleight, have consulted IR due for kyphoplasty on 02/19/2022.  For now TLSO brace, pain control and PT OT.  Weightbearing as tolerated.  He lives alone and currently in no shape or form stable to go home and live by himself.  Orthopedics following for left proximal humerus fracture for now they have recommended a sling with outpatient follow-up for surgical correction.    Multiple myeloma with normocytic anemia.  Case discussed with his oncologist Dr. Lorenso Courier he will follow-up postdischarge.  Dehydration and hyponatremia.  Hold HCTZ.  Gently hydrate.  GERD.  On PPI.  Obesity.  BMI 36.  Follow with PCP for weight loss.      Condition - Fair  Family Communication  :  None  Code Status :  Full  Consults  : Orthopedics, neurosurgery Dr. Pieter Partridge Dawley, oncologist Dr. Lorenso Courier, IR  PUD Prophylaxis : PPI   Procedures  :     CT - L spine showing compression fractures at L1 - L2 and L3.      Disposition Plan  :    Status is: Observation  DVT Prophylaxis  :    Place and maintain sequential compression device Start: 02/19/22 0839 enoxaparin (LOVENOX) injection 40 mg Start: 02/17/22 1600   Lab Results  Component Value Date   PLT 229 02/18/2022    Diet :  Diet Order             Diet NPO time specified Except for: Sips with Meds  Diet effective midnight                    Inpatient Medications  Scheduled Meds:  docusate sodium  200 mg Oral BID   enoxaparin (LOVENOX) injection  40 mg Subcutaneous Q24H   lisinopril  40 mg Oral Daily   morphine  30 mg Oral Q12H   multivitamin with minerals  1 tablet Oral Daily   pantoprazole  40 mg Oral Daily   polyethylene glycol  17 g Oral Daily   Continuous Infusions:   ceFAZolin (ANCEF) IV     lactated ringers 75 mL/hr at 02/18/22 2220   PRN Meds:.acetaminophen, albuterol, bisacodyl, hydrALAZINE, morphine injection, naLOXone (NARCAN)  injection, ondansetron **OR** ondansetron (ZOFRAN) IV,  oxyCODONE  Antibiotics  :    Anti-infectives (From admission, onward)    Start     Dose/Rate Route Frequency Ordered Stop   02/19/22 0800  ceFAZolin (ANCEF) IVPB 2g/100 mL premix        2 g 200 mL/hr over 30 Minutes Intravenous  Once 02/18/22 1635          Time Spent in minutes  30   Lala Lund M.D on 02/19/2022 at 8:38 AM  To page go to [email or web address]   [facility] -  Office  [phone]  See all Orders from today for further details    Objective:   Vitals:   02/18/22 2330 02/19/22 0308 02/19/22 0713 02/19/22 0725  BP: 120/75 127/76 128/84 133/85  Pulse: 95 86 96 100  Resp: _0 Temp: 98.7 F (37.1 C) 98.7 F (37.1 C) 98.6 F (37 C) 99.1 F (37.3 C)  TempSrc: Oral Oral Oral Oral  SpO2: 98% 97% 98% 99%    Wt Readings from Last 3 Encounters:  02/13/22 111.9 kg  02/10/22 110.6 kg  02/05/22 110.7 kg     Intake/Output Summary (Last 24 hours) at 02/19/2022 0838 Last data filed at 02/19/2022 0300 Gross per 24 hour  Intake 1590.34 ml  Output 1400 ml  Net 190.34 ml     Physical Exam  Awake Alert, No new F.N deficits, Normal affect [place].AT,PERRAL Supple Neck, No JVD,   Symmetrical Chest wall movement, Good air movement bilaterally, CTAB RRR,No Gallops, Rubs or new Murmurs,  +  ve B.Sounds, Abd Soft, No tenderness,   Left arm in sling, diffuse ache on palpation in lower L-spine, straight leg raise bilaterally causes pain in lower back.   Data Review:    CBC Recent Labs  Lab 02/17/22 0520 02/17/22 1546 02/18/22 0441  WBC 7.7  --  7.9  HGB 11.3* 11.6* 11.6*  HCT 34.7* 35.9* 35.7*  PLT 213  --  229  MCV 89.4  --  89.3  MCH 29.1  --  29.0  MCHC 32.6  --  32.5  RDW 13.0  --  13.0  LYMPHSABS 2.3  --   --   MONOABS 0.5  --   --   EOSABS 0.0  --   --   BASOSABS 0.0  --   --     Electrolytes Recent Labs  Lab 02/17/22 0520 02/18/22 0441  NA 133* 132*  K 4.0 4.0  CL 103 104  CO2 21* 25  GLUCOSE 116* 103*  BUN 19 11   CREATININE 1.17 0.93  CALCIUM 9.6 9.5  AST 30  --   ALT 33  --   ALKPHOS 47  --   BILITOT 0.5  --   ALBUMIN 2.9*  --   INR 1.0  --     ------------------------------------------------------------------------------------------------------------------ No results for input(s): "CHOL", "HDL", "LDLCALC", "TRIG", "CHOLHDL", "LDLDIRECT" in the last 72 hours.  Lab Results  Component Value Date   HGBA1C 5.9 (H) 12/05/2021     Radiology Reports DG Shoulder Left  Result Date: 02/17/2022 CLINICAL DATA:  Fall EXAM: LEFT SHOULDER - 3 VIEW COMPARISON:  Shoulder radiograph dated February 04, 2022 FINDINGS: Oblique displaced fracture of the proximal left humeral shaft. Associated lucent lesion of the proximal left humerus is again seen. Associated soft tissue edema of the proximal left arm. Visualized lung is clear. IMPRESSION: Oblique displaced fracture of the proximal left humeral shaft with associated lucent lesion, findings are concerning for pathologic compression fracture. Electronically Signed   By: Yetta Glassman M.D.   On: 02/17/2022 13:13   DG Pelvis Portable  Result Date: 02/17/2022 CLINICAL DATA:  Fall. EXAM: PORTABLE PELVIS 1-2 VIEWS COMPARISON:  CT abdomen and pelvis 07/21/2021 FINDINGS: No acute fracture or pelvic diastasis is identified. There is mild left hip joint space narrowing. IMPRESSION: No acute osseous abnormality identified. Electronically Signed   By: Logan Bores M.D.   On: 02/17/2022 13:09   DG Chest Portable 1 View  Result Date: 02/17/2022 CLINICAL DATA:  Fall. EXAM: PORTABLE CHEST 1 VIEW COMPARISON:  Chest radiographs 05/04/2019 FINDINGS: The cardiomediastinal silhouette is unchanged with normal heart size. Lung volumes are low with bronchovascular crowding. No confluent airspace opacity, overt pulmonary edema, sizable pleural effusion, or pneumothorax is identified. No acute osseous abnormality is seen. IMPRESSION: No active disease. Electronically Signed   By: Logan Bores M.D.   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 Low back pain. Status post recent L3 vertebral body biopsy, RF ablation, augmentation on 02/05/2022. Pathology revealed plasmacytoma. EXAM: CT LUMBAR SPINE WITHOUT CONTRAST TECHNIQUE: Multidetector CT imaging of the lumbar spine was performed without intravenous contrast administration. Multiplanar CT image reconstructions were also generated. RADIATION DOSE REDUCTION: This exam was performed according to the departmental dose-optimization program which includes automated exposure control, adjustment of the mA and/or kV according to patient size and/or use of iterative reconstruction technique. COMPARISON:  Lumbar MRI 01/07/2022. CT Abdomen and Pelvis 07/21/2021. FINDINGS:  Segmentation: Normal. Alignment: Stable lumbar lordosis. Vertebrae: Pronounced heterogeneity of bone mineralization since February this year. Diffuse lytic vertebral and visible pelvic bone lesions. Mild pathologic compression fracture of L1. More moderate pathologic compression fracture of L2, with lucency of the anterior superior endplate and 27% central loss of vertebral body height compared to February. No retropulsion of bone. L2 pedicles and posterior elements appear intact. Augmented moderate to severe L3 pathologic compression fracture. No significant L4 vertebral compression. Subtle L5 pathologic vertebral compression. Visible sacral ala appear intact despite myelomas involvement. Paraspinal and other soft tissues: Negative visible noncontrast abdominal viscera. Lumbar paraspinal soft tissues remain within normal limits. Disc levels: Lumbar spine degeneration predominantly moderate to severe facet arthropathy at L4-L5 where there is subtle chronic grade 1 anterolisthesis. IMPRESSION: 1. Diffuse Multiple Myeloma involvement of the visible spine and pelvis, new since a  February CT. 2. Augmented compression fracture of L3. Mild-to-moderate pathologic compression fracture of L2 (22% loss of vertebral body height, no complicating features). Mild pathologic compression fracture of L1. Electronically Signed   By: Genevie Ann M.D.   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 Low back pain. Status post recent L3 vertebral body biopsy, RF ablation, augmentation on 02/05/2022. Pathology revealed plasmacytoma. Neck pain. EXAM: CT CERVICAL SPINE WITHOUT CONTRAST TECHNIQUE: Multidetector CT imaging of the cervical spine was performed without intravenous contrast. Multiplanar CT image reconstructions were also generated. RADIATION DOSE REDUCTION: This exam was performed according to the departmental dose-optimization program which includes automated exposure control, adjustment of the mA and/or kV according to patient size and/or use of iterative reconstruction technique. COMPARISON:  Cervical spine radiographs 12/18/2021. FINDINGS: Alignment: Straightening of cervical lordosis. Cervicothoracic junction alignment is within normal limits. Bilateral posterior element alignment is within normal limits. Skull base and vertebrae: Visualized skull base is intact. No atlanto-occipital dissociation. C1 and C2 appear intact and aligned. Widely scattered ill-defined lucent lesions are present elsewhere in the cervical spine and visible upper thoracic spine C3 through T2. No cervical pathologic fracture is identified. Posterior element involvement is noted on the right at C4 (series 4, image 44). No expansile cervical lesion at this time. Soft tissues and spinal canal: No prevertebral fluid or swelling. No visible canal hematoma. Negative noncontrast visible neck soft tissues. Disc levels: Widespread superimposed cervical spine disc and endplate degeneration. Multilevel degenerative cervical  spinal stenosis, mild to moderate at C5-C6. Upper chest: T1 and T2 vertebral myeloma thus involvement, including an indistinct 10 mm lucent lesion of the left T1 body. Lung apices are clear. Negative visible noncontrast superior mediastinum. Other: Negative visible noncontrast posterior fossa. Visible paranasal sinuses, tympanic cavities and mastoids are well aerated. IMPRESSION: 1. Widespread cervical and visible upper thoracic vertebral involvement by Multiple Myeloma. No cervical pathologic fracture. 2. Superimposed bulky cervical spine degeneration. Subsequent mild to moderate degenerative spinal stenosis suspected at C5-C6. Electronically Signed   By: Genevie Ann M.D.   On: 02/17/2022 09:41

## 2022-02-19 NOTE — Progress Notes (Signed)
Pharmacist Chemotherapy Monitoring - Initial Assessment    Anticipated start date: 02/26/22   The following has been reviewed per standard work regarding the patient's treatment regimen: The patient's diagnosis, treatment plan and drug doses, and organ/hematologic function Lab orders and baseline tests specific to treatment regimen  The treatment plan start date, drug sequencing, and pre-medications Prior authorization status  Patient's documented medication list, including drug-drug interaction screen and prescriptions for anti-emetics and supportive care specific to the treatment regimen The drug concentrations, fluid compatibility, administration routes, and timing of the medications to be used The patient's access for treatment and lifetime cumulative dose history, if applicable  The patient's medication allergies and previous infusion related reactions, if applicable   Changes made to treatment plan:  N/A  Follow up needed:  Pending authorization for treatment  Need home antiemetic?   Kennith Center, Pharm.D., CPP 02/19/2022'@12'$ :17 PM

## 2022-02-19 NOTE — TOC Initial Note (Signed)
Transition of Care [facility]) - Initial/Assessment Note    Patient Details  Name: [name] MRN: [id number] Date of Birth: Jul 23, 1970  [facility] CM/SW Contact:    Curlene Labrum, RN Phone Number: 02/19/2022, 3:30 PM  Clinical Narrative:                 CM attempted to meet with the patient at the bedside but he is currently off the unit for an IR procedure.  The patient's family are currently in the room including patient's sister, brother in law and brother.  All three family members state that they are able to assist the patient at home once he is medically stable to discharge from the hospital at a later date.  The patient's sister, [name], states that she is retired and plans to assist the patient once discharged home from the hospital as needed.  I will speak with the patient in the morning and follow the patient for discharge needs for home - likely home health services as recommended by therapy at this time and pending re-evaluation by therapy post-procedure.  The patient family state that the patient does not have any DME at the home.  The family plan to provide assistance and transportation once discharged from the hospital - later date.  Expected Discharge Plan: [place] Barriers to Discharge: Continued Medical Work up   Patient Goals and CMS Choice        Expected Discharge Plan and Services Expected Discharge Plan: [place]   Discharge Planning Services: CM Consult                                          Prior Living Arrangements/Services   Lives with:: Self Patient language and need for interpreter reviewed:: Yes        Need for Family Participation in Patient Care: Yes (Comment) Care giver support system in place?: Yes (comment)   Criminal Activity/Legal Involvement Pertinent to Current Situation/Hospitalization: No - Comment as needed  Activities of Daily Living Home Assistive  Devices/Equipment: None ADL Screening (condition at time of admission) Patient's cognitive ability adequate to safely complete daily activities?: Yes Is the patient deaf or have difficulty hearing?: No Does the patient have difficulty seeing, even when wearing glasses/contacts?: No Does the patient have difficulty concentrating, remembering, or making decisions?: No Patient able to express need for assistance with ADLs?: Yes Does the patient have difficulty dressing or bathing?: No Independently performs ADLs?: Yes (appropriate for developmental age) Does the patient have difficulty walking or climbing stairs?: No Weakness of Legs: None Weakness of Arms/Hands: Left  Permission Sought/Granted Permission sought to share information with : Case Manager, Family Supports, Customer service manager                Emotional Assessment   Attitude/Demeanor/Rapport: Unable to Assess          Admission diagnosis:  Pathologic fracture [M84.40XA] Closed displaced oblique fracture of shaft of left humerus, initial encounter [S42.332A] Closed compression fracture of L2 lumbar vertebra, initial encounter ([place]) [S32.020A] Closed compression fracture of L1 lumbar vertebra, initial encounter ([place]) [S32.010A] Patient Active Problem List   Diagnosis Date Noted   Pathologic fracture 02/17/2022   Traumatic compression fracture of lumbar vertebra ([place]) 02/17/2022   Normocytic anemia 02/17/2022   Hyponatremia 02/17/2022   Fall at home, initial  encounter 02/17/2022   Obesity (BMI 30-39.9) 02/17/2022   Multiple myeloma ([place]) 02/16/2022   Multiple myeloma not having achieved remission ([place]) 02/16/2022   Hyperlipidemia 02/17/2019   Lesion of liver 09/23/2017   Atypical chest pain 05/01/2016   Abnormal ultrasound 07/19/2015   ALCOHOL ABUSE 09/06/2008   TOBACCO ABUSE 09/06/2008   Essential hypertension 09/06/2008   Chronic GERD 09/06/2008   PCP:  [name], MD Pharmacy:   [place] ([place]), [place] - [address] [place] [address]) [place] [address] Phone: [phone] Fax: [phone]  [id number] [place] [place]Phone: [phone]: [phone] [facility] [place] [facility] [id number] [place]: [phone]: [phone]    Social Determinants of Health (SDOH) Interventions    Readmission Risk Interventions    02/19/2022    3:30 PM  Readmission Risk Prevention Plan  Post Dischage Appt Complete  Medication Screening Complete  Transportation Screening Complete

## 2022-02-19 NOTE — Procedures (Signed)
INTERVENTIONAL NEURORADIOLOGY BRIEF POSTPROCEDURE NOTE  FLUOROSCOPY GUIDED L1 AND L2 RADIOFREQUENCY ABLATION AND BALLOON KYPHOPLASTY  Attending: Dr. Pedro Earls  Diagnosis: Pathologic fractures, symptomatic  Access site: Percutaneous  Anesthesia: Moderate sedation  Medication used: 6 mg Versed IV; 200 mcg Fentanyl IV; 1 mg Dilaudid IV.  Complications: None.  Estimated blood loss: Minimal.  Specimen: L1 core biopsy  Findings: Redemonstrated superior enplate fractures at L1 and L2. A core biopsy sample was obtained at the L1 level. Then, RFA followed by balloon kyphoplasty was performed at L1 and L2.   The patient tolerated the procedure well without incident or complication and is in stable condition.

## 2022-02-19 NOTE — Progress Notes (Signed)
Hematology/Oncology Progress Note  Clinical Summary: [name] 51 y.o. male with medical history significant for newly diagnosed IgG kappa multiple myeloma who presents for a follow up visit.   Interval History: -- IR consulted, planning for kyphoplasty today -- Pain medication currently consists of oxycodone 10 mg p.o. every 6 hours as needed and morphine 2 mg IV every 4 hours as needed -- Patient notes his pain is under control today -- Currently denies any nausea, vomiting, diarrhea, or constipation.  O:  Vitals:   02/19/22 0713 02/19/22 0725  BP: 128/84 133/85  Pulse: 96 100  Resp: 17 18  Temp: 98.6 F (37 C) 99.1 F (37.3 C)  SpO2: 98% 99%      Latest Ref Rng & Units 02/19/2022    7:45 AM 02/18/2022    4:41 AM 02/17/2022    5:20 AM  CMP  Glucose 70 - 99 mg/dL 93  103  116   BUN 6 - 20 mg/dL $Remove'13  11  19   'cDnrDXt$ Creatinine 0.61 - 1.24 mg/dL 1.00  0.93  1.17   Sodium 135 - 145 mmol/L 128  132  133   Potassium 3.5 - 5.1 mmol/L 4.1  4.0  4.0   Chloride 98 - 111 mmol/L 98  104  103   CO2 22 - 32 mmol/L $RemoveB'24  25  21   'fYiMaIqm$ Calcium 8.9 - 10.3 mg/dL 8.8  9.5  9.6   Total Protein 6.5 - 8.1 g/dL 9.1   9.4   Total Bilirubin 0.3 - 1.2 mg/dL 0.8   0.5   Alkaline Phos 38 - 126 U/L 46   47   AST 15 - 41 U/L 22   30   ALT 0 - 44 U/L 30   33       Latest Ref Rng & Units 02/19/2022    7:45 AM 02/18/2022    4:41 AM 02/17/2022    3:46 PM  CBC  WBC 4.0 - 10.5 K/uL 7.6  7.9    Hemoglobin 13.0 - 17.0 g/dL 10.9  11.6  11.6   Hematocrit 39.0 - 52.0 % 32.5  35.7  35.9   Platelets 150 - 400 K/uL 213  229        GENERAL: well appearing middle-aged African-American male in NAD  SKIN: skin color, texture, turgor are normal, no rashes or significant lesions EYES: conjunctiva are pink and non-injected, sclera clear LUNGS: clear to auscultation and percussion with normal breathing effort HEART: regular rate & rhythm and no murmurs and no lower extremity edem Musculoskeletal: no cyanosis of digits  and no clubbing  PSYCH: alert & oriented x 3, fluent speech NEURO: no focal motor/sensory deficits  Assessment/Plan:  [name] 51 y.o. male with medical history significant for newly diagnosed IgG kappa multiple myeloma who presents for a follow up visit.   # IgG Kappa Multiple Myeloma # Compression Fractures of the Spine -- Spinal imaging on 02/17/2022 shows compression fracture of L3 with mild to moderate pathologic compression fracture of L2 and mild pathologic compression of L1 -- IR has been consulted and is currently planning for kyphoplasty today.  We agree with this plan -- Today discussed with patient that definitive treatment would be chemotherapy and that we would like to start supportive therapy with denosumab or Zometa as soon as is feasible. -- Patient has a loose tooth and it would be preferable to have this pulled before starting any bone protecting medicine.  This would impair healing and potentially  the patient susceptible to osteonecrosis of the jaw if not addressed prior to start that treatment.  -- Patient currently scheduled for chemotherapy on 02/26/2022.  We will plan to proceed with treatment on that date. -- Pain control per primary team.  If difficulty weaning off IV pain medication can consider palliative care consult -- Oncology team will continue to follow   Ledell Peoples, MD Department of Hematology/Oncology [place] at [facility] Phone: [phone] Pager: [phone] Email: [email or web address]

## 2022-02-19 NOTE — Progress Notes (Signed)
Patient moved from IR table back to bed with assistance. Patient denies any pain or discomfort after transfer. Patient left arm sling placed back on arm. Dressings on back clean, dry, intact. Report given to patient nurse Margaretha Sheffield.

## 2022-02-19 NOTE — Discharge Instructions (Addendum)
No lifting >10 lb for two weeks, keep incision site clean and dry  Follow with Primary MD [name], MD in 7 days   Get CBC, CMP, Magnesium , 2 view Chest X ray -  checked next visit within 1 week by Primary MD   Activity: As tolerated with Full fall precautions use walker/cane & assistance as needed  Disposition Home    Diet: Heart Healthy    Special Instructions: If you have smoked or chewed Tobacco  in the last 2 yrs please stop smoking, stop any regular Alcohol  and or any Recreational drug use.  On your next visit with your primary care physician please Get Medicines reviewed and adjusted.  Please request your Prim.MD to go over all Hospital Tests and Procedure/Radiological results at the follow up, please get all Hospital records sent to your Prim MD by signing hospital release before you go home.  If you experience worsening of your admission symptoms, develop shortness of breath, life threatening emergency, suicidal or homicidal thoughts you must seek medical attention immediately by calling 911 or calling your MD immediately  if symptoms less severe.  You Must read complete instructions/literature along with all the possible adverse reactions/side effects for all the Medicines you take and that have been prescribed to you. Take any new Medicines after you have completely understood and accpet all the possible adverse reactions/side effects.     Orthopedic discharge instruction:  -maintain postoperative bandages until your follow-up appointment.  You may shower with these in place.  Do not submerge underwater.  -Sling to be worn for comfort only.  You may remove and use the arm as tolerated.  -Apply ice liberally to the shoulder throughout the day 20 to 30 minutes at a time.  -Return to see Dr. Stann Mainland in the office in 2 weeks.

## 2022-02-19 NOTE — Consult Note (Signed)
Chief Complaint: Patient was seen in consultation today for IgG kappa multiple myeloma  Referring Physician(s): Dorsey,John T IV  Supervising Physician: Pedro Earls  Patient Status: [facility] - In-pt  History of Present Illness: [name] is a 51 y.o. male with past medical history of GERD, atypical chest pain, hiatal hernia, HLD, HTN, recently found to have IgG kappa multiple myeloma admitted with severe back pain with fall at home.  He was found to have extension of his old L3 compression fracture, and L1 and L2 compression fractures which are new. He was admitted for pain control as well as consideration of treatment of his new pathologic fractures. NIR consulted.  Dr. Karenann Cai has reviewed his case and has approved patient for procedure.  Insurance authorization submitted for inpatient radiofrequency ablation with vertebroplasty/kyphoplasty of L1 and L2 compression fracture in the setting of acute pathologic fracture with acute pain requiring scheduled narcotic use.   Patient assessed in [facility] Radiology today.  He has been NPO.  His last dose of lovenox was yesterday afternoon.  He is familiar with procedure, expectations, goals, risks/benefits and is agreeable to proceed.   Past Medical History:  Diagnosis Date   Allergy    Atypical chest pain    GERD (gastroesophageal reflux disease)    Hepatic cyst    Benign by MRI   Hiatal hernia    History of colonic polyps    Hyperlipidemia    Hypertension    Rectal bleeding    Tobacco abuse     Past Surgical History:  Procedure Laterality Date   COLONOSCOPY  01/10/2009     RMR: Normal rectum, normal colon; repeat in 2015 due to [place] of colon cancer   COLONOSCOPY N/A 01/09/2014   PXT:[id number] colonic polyps-removed as described   COLONOSCOPY N/A 09/18/2016   Procedure: COLONOSCOPY;  Surgeon: Daneil Dolin, MD;  [facility];  Service: Endoscopy;  Laterality: N/A;  730     ESOPHAGOGASTRODUODENOSCOPY  10/04/2007   RMR: Distal esophageal erosions consistent with erosive reflux esophagitis, patulous gastroesophageal junction status post passage of a  Maloney dilator, 91 [place].  Otherwise, unremarkable esophagus.  Hiatal hernia.  Otherwise normal stomach.  Bulbar erosion   ESOPHAGOGASTRODUODENOSCOPY N/A 01/09/2014   Erosive reflux esophagitis. Small hiatal hernia   I & D EXTREMITY Left 07/26/2018   Procedure: IRRIGATION AND DEBRIDEMENT EXTREMITY;  Surgeon: Dayna Barker, MD;  Location: [place];  Service: Plastics;  Laterality: Left;   IR BONE TUMOR(S)RF ABLATION  02/05/2022   IR KYPHO LUMBAR INC FX REDUCE BONE BX UNI/BIL CANNULATION INC/IMAGING  02/05/2022   PERCUTANEOUS PINNING Left 07/26/2018   Procedure: PERCUTANEOUS PINNING EXTREMITY;  Surgeon: Dayna Barker, MD;  Location: [place];  Service: Plastics;  Laterality: Left;    Allergies: Patient has no known allergies.  Medications: Prior to Admission medications   Medication Sig Start Date End Date Taking? Authorizing Provider  acetaminophen (TYLENOL) 500 MG tablet Take 500 mg by mouth every 6 (six) hours as needed for mild pain or moderate pain.   Yes [provider]  hydrochlorothiazide (HYDRODIURIL) 25 MG tablet Take 1 tablet (25 mg total) by mouth daily. 12/05/21  Yes [name], MD  ibuprofen (ADVIL) 600 MG tablet Take 1 tablet (600 mg total) by mouth every 6 (six) hours as needed. Patient taking differently: Take 600 mg by mouth every 6 (six) hours as needed for mild pain or moderate pain. 01/04/22  Yes Dion Saucier A, PA  lisinopril (ZESTRIL)  40 MG tablet Take 1 tablet (40 mg total) by mouth daily. 12/05/21  Yes [name], MD  oxyCODONE (OXY IR/ROXICODONE) 5 MG immediate release tablet Take 1 tablet (5 mg total) by mouth every 4 (four) hours as needed for severe pain. 02/13/22  Yes Orson Slick, MD  pantoprazole (PROTONIX) 40 MG tablet Take 1 tablet (40 mg total) by mouth daily. 12/05/21  Yes  [name], MD  docusate sodium (COLACE) 100 MG capsule Take 1 capsule (100 mg total) by mouth every 12 (twelve) hours. Patient not taking: Reported on 02/17/2022 02/04/22   Valarie Merino, MD  lenalidomide (REVLIMID) 25 MG capsule Take 1 capsule (25 mg total) by mouth daily. Take for 14 days and then none for 7 days. Repeat every 21 days. [employer] Auth # [id number] Date Obtained 02/13/22 Patient not taking: Reported on 02/17/2022 02/14/22   Orson Slick, MD  rosuvastatin (CRESTOR) 10 MG tablet Take 1 tablet (10 mg total) by mouth daily. Patient not taking: Reported on 02/17/2022 12/21/21   [name], MD     Family History  Problem Relation Age of Onset   Heart disease Mother    Hypertension Mother    Diabetes Father    Hypertension Father    Colon cancer Sister        passed away from colon cancer, in her 25s   Heart attack Maternal Uncle    Prostate cancer Maternal Uncle    Diabetes Other    Pancreatic cancer Neg Hx    Rectal cancer Neg Hx    Esophageal cancer Neg Hx    Stomach cancer Neg Hx     Social History   Socioeconomic History   Marital status: Single    Spouse name: Not on file   Number of children: 2   Years of education: Not on file   Highest education level: Not on file  Occupational History   Occupation: Scientist, 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   Smoking status: Every Day    Packs/day: 0.25    Years: 10.00    Total pack years: 2.50    Types: Cigarettes   Smokeless tobacco: Never  Vaping Use   Vaping Use: Never used  Substance and Sexual Activity   Alcohol use: Yes    Alcohol/week: 0.0 standard drinks of alcohol    Comment: occasional/wine on the weekends   Drug use: No   Sexual activity: Not on file  Other Topics Concern   Not on file  Social History Narrative   Right handed   Lives alone one story home   Social Determinants of Health   Financial Resource Strain: Not on file   Food Insecurity: No Food Insecurity (02/18/2022)   Hunger Vital Sign    Worried About Running Out of Food in the Last Year: Never true    Ran Out of Food in the Last Year: Never true  Transportation Needs: No Transportation Needs (02/18/2022)   PRAPARE - Hydrologist (Medical): No    Lack of Transportation (Non-Medical): No  Physical Activity: Not on file  Stress: Not on file  Social Connections: Not on file     Review of Systems: A 12 point ROS discussed and pertinent positives are indicated in the HPI above.  All other systems are negative.  Review of Systems  Constitutional:  Negative for fatigue and fever.  Respiratory:  Negative  for cough and shortness of breath.   Cardiovascular:  Negative for chest pain.  Gastrointestinal:  Negative for abdominal pain, nausea and vomiting.  Musculoskeletal:  Negative for back pain.  Psychiatric/Behavioral:  Negative for behavioral problems and confusion.     Vital Signs: BP 133/85 (BP Location: Right Arm)   Pulse 100   Temp 99.1 F (37.3 C) (Oral)   Resp 18   SpO2 99%   Physical Exam Vitals and nursing note reviewed.  Constitutional:      General: He is not in acute distress.    Appearance: Normal appearance. He is not ill-appearing.  HENT:     Mouth/Throat:     Mouth: Mucous membranes are moist.     Pharynx: Oropharynx is clear.  Cardiovascular:     Rate and Rhythm: Normal rate and regular rhythm.  Pulmonary:     Effort: Pulmonary effort is normal. No respiratory distress.     Breath sounds: Normal breath sounds.  Abdominal:     General: Abdomen is flat.     Palpations: Abdomen is soft.  Musculoskeletal:        General: Tenderness (back pain) present.  Skin:    General: Skin is warm and dry.  Neurological:     General: No focal deficit present.     Mental Status: He is alert. Mental status is at baseline.  Psychiatric:        Mood and Affect: Mood normal.        Behavior: Behavior normal.         Thought Content: Thought content normal.        Judgment: Judgment normal.          Imaging: DG Shoulder Left  Result Date: 02/17/2022 CLINICAL DATA:  Fall EXAM: LEFT SHOULDER - 3 VIEW COMPARISON:  Shoulder radiograph dated February 04, 2022 FINDINGS: Oblique displaced fracture of the proximal left humeral shaft. Associated lucent lesion of the proximal left humerus is again seen. Associated soft tissue edema of the proximal left arm. Visualized lung is clear. IMPRESSION: Oblique displaced fracture of the proximal left humeral shaft with associated lucent lesion, findings are concerning for pathologic compression fracture. Electronically Signed   By: Yetta Glassman M.D.   On: 02/17/2022 13:13   DG Pelvis Portable  Result Date: 02/17/2022 CLINICAL DATA:  Fall. EXAM: PORTABLE PELVIS 1-2 VIEWS COMPARISON:  CT abdomen and pelvis 07/21/2021 FINDINGS: No acute fracture or pelvic diastasis is identified. There is mild left hip joint space narrowing. IMPRESSION: No acute osseous abnormality identified. Electronically Signed   By: Logan Bores M.D.   On: 02/17/2022 13:09   DG Chest Portable 1 View  Result Date: 02/17/2022 CLINICAL DATA:  Fall. EXAM: PORTABLE CHEST 1 VIEW COMPARISON:  Chest radiographs 05/04/2019 FINDINGS: The cardiomediastinal silhouette is unchanged with normal heart size. Lung volumes are low with bronchovascular crowding. No confluent airspace opacity, overt pulmonary edema, sizable pleural effusion, or pneumothorax is identified. No acute osseous abnormality is seen. IMPRESSION: No active disease. Electronically Signed   By: Logan Bores M.D.   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 Low back pain. Status post recent L3 vertebral body biopsy, RF ablation, augmentation on 02/05/2022. Pathology revealed plasmacytoma. EXAM: CT LUMBAR SPINE WITHOUT CONTRAST  TECHNIQUE: Multidetector CT imaging of the lumbar spine was performed without intravenous contrast administration. Multiplanar CT image reconstructions were also generated. RADIATION DOSE REDUCTION: This exam was performed  according to the departmental dose-optimization program which includes automated exposure control, adjustment of the mA and/or kV according to patient size and/or use of iterative reconstruction technique. COMPARISON:  Lumbar MRI 01/07/2022. CT Abdomen and Pelvis 07/21/2021. FINDINGS: Segmentation: Normal. Alignment: Stable lumbar lordosis. Vertebrae: Pronounced heterogeneity of bone mineralization since February this year. Diffuse lytic vertebral and visible pelvic bone lesions. Mild pathologic compression fracture of L1. More moderate pathologic compression fracture of L2, with lucency of the anterior superior endplate and 09% central loss of vertebral body height compared to February. No retropulsion of bone. L2 pedicles and posterior elements appear intact. Augmented moderate to severe L3 pathologic compression fracture. No significant L4 vertebral compression. Subtle L5 pathologic vertebral compression. Visible sacral ala appear intact despite myelomas involvement. Paraspinal and other soft tissues: Negative visible noncontrast abdominal viscera. Lumbar paraspinal soft tissues remain within normal limits. Disc levels: Lumbar spine degeneration predominantly moderate to severe facet arthropathy at L4-L5 where there is subtle chronic grade 1 anterolisthesis. IMPRESSION: 1. Diffuse Multiple Myeloma involvement of the visible spine and pelvis, new since a February CT. 2. Augmented compression fracture of L3. Mild-to-moderate pathologic compression fracture of L2 (22% loss of vertebral body height, no complicating features). Mild pathologic compression fracture of L1. Electronically Signed   By: Genevie Ann M.D.   On: 02/17/2022 09:46   CT Cervical Spine Wo Contrast  Result Date:  02/17/2022 CLINICAL DATA:  51 year old male with monoclonal gammopathy/multiple myeloma. Low back pain. Status post recent L3 vertebral body biopsy, RF ablation, augmentation on 02/05/2022. Pathology revealed plasmacytoma. Neck pain. EXAM: CT CERVICAL SPINE WITHOUT CONTRAST TECHNIQUE: Multidetector CT imaging of the cervical spine was performed without intravenous contrast. Multiplanar CT image reconstructions were also generated. RADIATION DOSE REDUCTION: This exam was performed according to the departmental dose-optimization program which includes automated exposure control, adjustment of the mA and/or kV according to patient size and/or use of iterative reconstruction technique. COMPARISON:  Cervical spine radiographs 12/18/2021. FINDINGS: Alignment: Straightening of cervical lordosis. Cervicothoracic junction alignment is within normal limits. Bilateral posterior element alignment is within normal limits. Skull base and vertebrae: Visualized skull base is intact. No atlanto-occipital dissociation. C1 and C2 appear intact and aligned. Widely scattered ill-defined lucent lesions are present elsewhere in the cervical spine and visible upper thoracic spine C3 through T2. No cervical pathologic fracture is identified. Posterior element involvement is noted on the right at C4 (series 4, image 44). No expansile cervical lesion at this time. Soft tissues and spinal canal: No prevertebral fluid or swelling. No visible canal hematoma. Negative noncontrast visible neck soft tissues. Disc levels: Widespread superimposed cervical spine disc and endplate degeneration. Multilevel degenerative cervical spinal stenosis, mild to moderate at C5-C6. Upper chest: T1 and T2 vertebral myeloma thus involvement, including an indistinct 10 mm lucent lesion of the left T1 body. Lung apices are clear. Negative visible noncontrast superior mediastinum. Other: Negative visible noncontrast posterior fossa. Visible paranasal sinuses, tympanic  cavities and mastoids are well aerated. IMPRESSION: 1. Widespread cervical and visible upper thoracic vertebral involvement by Multiple Myeloma. No cervical pathologic fracture. 2. Superimposed bulky cervical spine degeneration. Subsequent mild to moderate degenerative spinal stenosis suspected at C5-C6. Electronically Signed   By: Genevie Ann M.D.   On: 02/17/2022 09:41   CT BONE MARROW BIOPSY & ASPIRATION  Result Date: 02/10/2022 INDICATION: 51 year old male with monoclonal gammopathy of uncertain significance and possible multiple myeloma. EXAM: CT GUIDED BONE MARROW ASPIRATION AND CORE BIOPSY Interventional Radiologist:  Criselda Peaches, MD MEDICATIONS: None. ANESTHESIA/SEDATION: None  FLUOROSCOPY: None COMPLICATIONS: None immediate. Estimated blood loss: <25 mL PROCEDURE: Informed written consent was obtained from the patient after a thorough discussion of the procedural risks, benefits and alternatives. All questions were addressed. Maximal Sterile Barrier Technique was utilized including caps, mask, sterile gowns, sterile gloves, sterile drape, hand hygiene and skin antiseptic. A timeout was performed prior to the initiation of the procedure. The patient was positioned prone and non-contrast localization CT was performed of the pelvis to demonstrate the iliac marrow spaces. Maximal barrier sterile technique utilized including caps, mask, sterile gowns, sterile gloves, large sterile drape, hand hygiene, and betadine prep. Under sterile conditions and local anesthesia, an 11 gauge coaxial bone biopsy needle was advanced into the right iliac marrow space. Needle position was confirmed with CT imaging. Initially, bone marrow aspiration was performed. Next, the 11 gauge outer cannula was utilized to obtain a right iliac bone marrow core biopsy. Needle was removed. Hemostasis was obtained with compression. The patient tolerated the procedure well. Samples were prepared with the cytotechnologist. IMPRESSION:  Successful right iliac bone marrow aspiration and core biopsy. Electronically Signed   By: Jacqulynn Cadet M.D.   On: 02/10/2022 15:18   IR KYPHO LUMBAR INC FX REDUCE BONE BX UNI/BIL CANNULATION INC/IMAGING  Result Date: 02/05/2022 INDICATION: Pathologic L3 vertebral body compression fracture, concern for multiple myeloma versus other hematologic malignancy EXAM: 1. L3 vertebral body biopsy using fluoroscopic guidance 2. L3 vertebral body radiofrequency ablation using OsteoCool 3. L3 vertebral body augmentation using balloon kyphoplasty COMPARISON:  MRI lumbar spine January 07, 2022 MEDICATIONS: As antibiotic prophylaxis, Ancef 2gm IV was ordered pre-procedure and administered intravenously within 1 hour of incision. All current medications are in the EMR and have been reviewed as part of this encounter. ANESTHESIA/SEDATION: Moderate (conscious) sedation was employed during this procedure. A total of Versed 5 mg and Fentanyl 200 mcg was administered intravenously by the radiology nurse. Total intra-service moderate Sedation Time: 64 minutes. The patient's level of consciousness and vital signs were monitored continuously by radiology nursing throughout the procedure under my direct supervision. FLUOROSCOPY: Radiation Exposure Index (as provided by the fluoroscopic device): 5.9 minutes (98 mGy) COMPLICATIONS: None immediate. PROCEDURE: Informed written consent was obtained from the patient after a thorough discussion of the procedural risks, benefits and alternatives. All questions were addressed. Maximal Sterile Barrier Technique was utilized including caps, mask, sterile gowns, sterile gloves, sterile drape, hand hygiene and skin antiseptic. A timeout was performed prior to the initiation of the procedure. The patient was positioned prone on the exam table. A bipedicular access was planned. Skin entry site(s) were marked overlying the L3 vertebral body using fluoroscopy. The overlying skin was then prepped and  draped in the standard sterile fashion. Local analgesia was obtained with 1% lidocaine. Attention was first turned to the right side. Under fluoroscopic guidance, a 10 gauge introducer needle was advanced towards the lateral margin of the pedicle. Using multiple projections, the introducer needle was advanced towards the posterior margin of the vertebral body via a transpedicular approach. The posterior margin of the ablation zone was then marked using the inner needle of the introducer. The inner needle was then removed, and a core needle biopsy of the vertebral body was performed using a 10 gauge core biopsy device. A small amount of tissue was retrieved and sent to the lab for analysis. Next, the marking drill was advanced towards the anterior margin of the vertebral body for RF probe selection. The 20 mm RF ablation probe was determined to  be appropriate. Attention was then turned to the contralateral side. Under fluoroscopic guidance, a 10 gauge introducer needle was advanced towards the lateral margin of the pedicle. Using multiple projections, the introducer needle was advanced towards the posterior margin of the vertebral body via a transpedicular approach. Again, the posterior margin of the ablation zone was then marked using the inner needle of the introducer. In a similar fashion, core needle biopsy of the vertebral body was performed using a 10 gauge core biopsy device. Small amount of tissue was again retrieved and sent to the lab for analysis. The marking drill was then advanced towards the anterior margin of the vertebral body for RF probe selection. The 20 mm RF ablation probe was determined to be appropriate. The selected RF ablation probes were then advanced through the bilateral transpedicular access needles and locked into position. Appropriate location within the vertebral body was confirmed with multiple projections. RF ablation was then performed for 15 minutes at 70 degrees Celsius. The  patient tolerated this portion of the procedure well without significant discomfort. At this time, the acrylic bone cement mixture was reconstituted in the Kyphon bone mixing device system. This was then loaded onto the Kyphon bone fillers. Upon completion of the RF ablation, the ablation probes were removed. The Kyphon 15 mm inflatable bone tamps were then advanced through the bilateral transpedicular access needles and positioned within the mid vertebral body. Kyphoplasty was then performed, ensuring that the balloon contours stayed within the vertebral body margins. The balloons were then deflated and removed, followed by advancement of the bone filler devices bilaterally and the instillation of acrylic bone cement with excellent filling in the AP and lateral projections. No extravasation was noted in the disk spaces or posteriorly into the spinal canal. No epidural venous contamination was seen. At the end of the procedure, the introducer cannulas and bone filler devices were then removed without difficulty. Clean dressings were placed after hemostasis. The patient tolerated all aspects of the procedure well, and was transferred to recovery in stable condition. IMPRESSION: 1. Successful core biopsy of the L3 vertebral body using fluoroscopic guidance. 2. Successful radiofrequency ablation (OsteoCool) of the L3 vertebral body. 3. Successful L3 vertebral body augmentation using balloon kyphoplasty. If the patient has known osteoporosis, recommend treatment as clinically indicated. If the patient's bone density status is unknown, DEXA scan is recommended. Electronically Signed   By: Albin Felling M.D.   On: 02/05/2022 11:28   IR Bone Tumor(s)RF Ablation  Result Date: 02/05/2022 INDICATION: Pathologic L3 vertebral body compression fracture, concern for multiple myeloma versus other hematologic malignancy EXAM: 1. L3 vertebral body biopsy using fluoroscopic guidance 2. L3 vertebral body radiofrequency ablation  using OsteoCool 3. L3 vertebral body augmentation using balloon kyphoplasty COMPARISON:  MRI lumbar spine January 07, 2022 MEDICATIONS: As antibiotic prophylaxis, Ancef 2gm IV was ordered pre-procedure and administered intravenously within 1 hour of incision. All current medications are in the EMR and have been reviewed as part of this encounter. ANESTHESIA/SEDATION: Moderate (conscious) sedation was employed during this procedure. A total of Versed 5 mg and Fentanyl 200 mcg was administered intravenously by the radiology nurse. Total intra-service moderate Sedation Time: 64 minutes. The patient's level of consciousness and vital signs were monitored continuously by radiology nursing throughout the procedure under my direct supervision. FLUOROSCOPY: Radiation Exposure Index (as provided by the fluoroscopic device): 5.9 minutes (98 mGy) COMPLICATIONS: None immediate. PROCEDURE: Informed written consent was obtained from the patient after a thorough discussion of the procedural risks,  benefits and alternatives. All questions were addressed. Maximal Sterile Barrier Technique was utilized including caps, mask, sterile gowns, sterile gloves, sterile drape, hand hygiene and skin antiseptic. A timeout was performed prior to the initiation of the procedure. The patient was positioned prone on the exam table. A bipedicular access was planned. Skin entry site(s) were marked overlying the L3 vertebral body using fluoroscopy. The overlying skin was then prepped and draped in the standard sterile fashion. Local analgesia was obtained with 1% lidocaine. Attention was first turned to the right side. Under fluoroscopic guidance, a 10 gauge introducer needle was advanced towards the lateral margin of the pedicle. Using multiple projections, the introducer needle was advanced towards the posterior margin of the vertebral body via a transpedicular approach. The posterior margin of the ablation zone was then marked using the inner  needle of the introducer. The inner needle was then removed, and a core needle biopsy of the vertebral body was performed using a 10 gauge core biopsy device. A small amount of tissue was retrieved and sent to the lab for analysis. Next, the marking drill was advanced towards the anterior margin of the vertebral body for RF probe selection. The 20 mm RF ablation probe was determined to be appropriate. Attention was then turned to the contralateral side. Under fluoroscopic guidance, a 10 gauge introducer needle was advanced towards the lateral margin of the pedicle. Using multiple projections, the introducer needle was advanced towards the posterior margin of the vertebral body via a transpedicular approach. Again, the posterior margin of the ablation zone was then marked using the inner needle of the introducer. In a similar fashion, core needle biopsy of the vertebral body was performed using a 10 gauge core biopsy device. Small amount of tissue was again retrieved and sent to the lab for analysis. The marking drill was then advanced towards the anterior margin of the vertebral body for RF probe selection. The 20 mm RF ablation probe was determined to be appropriate. The selected RF ablation probes were then advanced through the bilateral transpedicular access needles and locked into position. Appropriate location within the vertebral body was confirmed with multiple projections. RF ablation was then performed for 15 minutes at 70 degrees Celsius. The patient tolerated this portion of the procedure well without significant discomfort. At this time, the acrylic bone cement mixture was reconstituted in the Kyphon bone mixing device system. This was then loaded onto the Kyphon bone fillers. Upon completion of the RF ablation, the ablation probes were removed. The Kyphon 15 mm inflatable bone tamps were then advanced through the bilateral transpedicular access needles and positioned within the mid vertebral body.  Kyphoplasty was then performed, ensuring that the balloon contours stayed within the vertebral body margins. The balloons were then deflated and removed, followed by advancement of the bone filler devices bilaterally and the instillation of acrylic bone cement with excellent filling in the AP and lateral projections. No extravasation was noted in the disk spaces or posteriorly into the spinal canal. No epidural venous contamination was seen. At the end of the procedure, the introducer cannulas and bone filler devices were then removed without difficulty. Clean dressings were placed after hemostasis. The patient tolerated all aspects of the procedure well, and was transferred to recovery in stable condition. IMPRESSION: 1. Successful core biopsy of the L3 vertebral body using fluoroscopic guidance. 2. Successful radiofrequency ablation (OsteoCool) of the L3 vertebral body. 3. Successful L3 vertebral body augmentation using balloon kyphoplasty. If the patient has known osteoporosis,  recommend treatment as clinically indicated. If the patient's bone density status is unknown, DEXA scan is recommended. Electronically Signed   By: Albin Felling M.D.   On: 02/05/2022 11:28   DG Humerus Left  Result Date: 02/04/2022 CLINICAL DATA:  Acute left arm pain without known injury. EXAM: LEFT HUMERUS - 2+ VIEW COMPARISON:  None Available. FINDINGS: No definite fracture is noted. However, there is seen a large lucency or lytic lesion seen involving the proximal left humeral shaft concerning for metastatic disease or possibly myeloma. IMPRESSION: Probable large lytic lesion seen involving proximal left humeral shaft concerning for metastatic disease or possible myeloma. MRI is recommended for further evaluation. Electronically Signed   By: Marijo Conception M.D.   On: 02/04/2022 12:33   DG Shoulder Left  Result Date: 02/04/2022 CLINICAL DATA:  Left shoulder pain without known injury. EXAM: LEFT SHOULDER - 2+ VIEW COMPARISON:   December 18, 2021. FINDINGS: There is no evidence of fracture or dislocation. Joint spaces are intact. However, lucency is seen involving the medullary portion of the proximal left humeral shaft scalping of the adjacent cortices, concerning for neoplasm or malignancy. Soft tissues are unremarkable. IMPRESSION: Large lucency is seen in the medullary portion of the proximal left humeral shaft with scalloping of the adjacent cortices concerning for destructive neoplasm or metastatic disease. Multiple myeloma is a consideration. MRI is recommended for further evaluation. Electronically Signed   By: Marijo Conception M.D.   On: 02/04/2022 12:32   DG Radiologist Eval And Mgmt  Result Date: 01/30/2022 EXAMINATION: New patient consultation HISTORY: HISTORY OF PRESENT ILLNESS: Sylvain, Olon J; 51 y/o -old; M who presents with painful subacute lumbar compression fracture. He developed severe lumbar pain several weeks ago. This developed without discrete accident or inciting factor. No previous episodes. He rates his pain 10/10 on the visual analog pain scale at its worst, improved to 7/10 with current prescribed pain medications. His pain is nonradiating. Pain is most acute when sitting and standing, somewhat relieved when sitting and supine. No new bowel or bladder control issues. No new lower extremity weakness or numbness. Medication: hydrochlorothiazide (HYDRODIURIL) 25 MG tablet Take 1 tablet (25 mg total) by mouth daily. ibuprofen (ADVIL) 600 MG tablet Take 1 tablet (600 mg total) by mouth every 6 (six) hours as needed. lisinopril (ZESTRIL) 40 MG tablet Take 1 tablet (40 mg total) by mouth daily. oxyCODONE (OXY IR/ROXICODONE) 5 MG immediate release tablet Take 1-2 tablets (5-10 mg total) by mouth every 6 (six) hours as needed for severe pain. pantoprazole (PROTONIX) 40 MG tablet Take 1 tablet (40 mg total) by mouth daily. rosuvastatin (CRESTOR) 10 MG tablet Take 1 tablet (10 mg total) by mouth daily. Allergies: None known  Past Medical History: . Allergy . Atypical chest pain . GERD (gastroesophageal reflux disease) . Hepatic cyst Benign by MRI . Hiatal hernia . History of colonic polyps . Hyperlipidemia . Hypertension . Rectal bleeding . Tobacco abuse Surgical History: . COLONOSCOPY 01/10/2009 RMR: Normal rectum, normal colon; repeat in 2015 due to [place] of colon cancer . COLONOSCOPY N/A 01/09/2014 [id number] colonic polyps-removed as described . COLONOSCOPY N/A 09/18/2016 Procedure: COLONOSCOPY; Surgeon: Daneil Dolin, MD; Location: [facility]; Service: Endoscopy; Laterality: N/A; 730 . ESOPHAGOGASTRODUODENOSCOPY 10/04/2007 RMR: Distal esophageal erosions consistent with erosive reflux esophagitis, patulous gastroesophageal junction status post passage of a [place] dilator, 49 [place]. Otherwise, unremarkable esophagus. Hiatal hernia. Otherwise normal stomach. Bulbar erosion . ESOPHAGOGASTRODUODENOSCOPY N/A 01/09/2014 Erosive reflux esophagitis. Small hiatal hernia . I &  D EXTREMITY Left 07/26/2018 Procedure: IRRIGATION AND DEBRIDEMENT EXTREMITY; Surgeon: Dayna Barker, MD; Location: [place]; Service: Plastics; Laterality: Left; . PERCUTANEOUS PINNING Left 07/26/2018 Procedure: PERCUTANEOUS PINNING EXTREMITY; Surgeon: Dayna Barker, MD; Location: [facility]; Service: Plastics; Laterality: Left; Family History: . Heart disease Mother . Hypertension Mother . Diabetes Father . Hypertension Father . Colon cancer Sister passed away from colon cancer, in her 3s . Heart attack Maternal Uncle . Prostate cancer Maternal Uncle . Diabetes Other . Pancreatic cancer Neg Hx . Rectal cancer Neg Hx . Esophageal cancer Neg Hx . Stomach cancer Neg Hx Social History: Socioeconomic History . Marital status: Single . Number of children: 2 Occupational History . Occupation: Estate agent: [employer] Comment: IT sales professional in [place] . Smoking status: Every Day Packs/day: 0.25 Years: 10.00 Total pack years: 2.50 Types: Cigarettes .  Alcohol use: Yes Alcohol/week: 0.0 standard drinks of alcohol Comment: occasional/wine on the weekends . Drug use: No . Sexual activity: Not on file Other Topics Concern Right handed Lives alone one story home Advance Care: The advanced care plan/surrogate decision maker was discussed at the time of visit and the patient did not wish to discuss or was not able to name a surrogate decision maker or provide an advance care plan. REVIEW OF SYSTEMS: Constitutional Symptoms: Patient denies weight change, fever or chills at this time. Eyes: None Ears, Nose, Mouth, Throat: No auditory complaints, no sore throat, no sinusitis. Cardiovascular/Vascular: No known heart problems. Denies chest pain or palpitations. Respiratory: Patient denies cough or shortness of breath. GI: Patient denies abdominal pain, nausea, vomiting, diarrhea, bloody stools or constipation. GU: No change. Musculoskeletal: As above Integumentary (Skin): Patient denies rashes, ulcerations or skin color changes. Neuro: Patient denies decreased sensation, seizures or weakness. Psychological: None Hematological/Lymphatic: Patient denies easy bruising or bleeding. Allergy/Immune: None PHYSICAL EXAMINATION: Fall Risk Assessment: Ambulatory, displays no signs for fall risk using the CDC TUG assessment. Vital Signs: BP 133/83 (BP Location: Right Arm, Patient Position: Sitting, Cuff Size: Normal)  Pulse 83  Temp 98.6 F (37 C) (Oral)  Wt 111.6 kg  SpO2 97% Comment: room air  BMI 36.33 kg/m HENT: Head: Normocephalic and atraumatic. Eyes: Conjunctivae and EOM are normal. Right eye exhibits no discharge. Left eye exhibits no discharge. No scleral icterus. Neck: No JVD present. Pulmonary/Chest: Effort normal. No stridor. No respiratory distress. Abdomen: soft, non distended Neurological:  alert and oriented to person, place, and time. Skin: Skin is warm and dry.  not diaphoretic. Psychiatric: normal mood and affect. behavior is normal. Judgment and thought content  normal. BMI: 36.33. For patients, who provided height and weight, and are 18 or older with a BMI of 25.0 - 29.9 (overweight) or 30.0 or over (obesity), it is recommended the patient receive counseling regarding diet, nutrition and activity. IMAGING FINDINGS: MRI LUMBAR SPINE WITHOUT AND WITH CONTRAST 01/07/2022 IMPRESSION: 1. Diffuse heterogeneous marrow signal with heterogeneous enhancement throughout the thoracolumbar spine. The heterogeneous marrow is new compared with an MRI of the abdomen performed 05/27/2016. Overall appearance is concerning for an infiltrative marrow process such as myeloma or leukemia until proven otherwise. Correlate with laboratory values. 2. Subacute-chronic L3 vertebral body compression fracture with approximately 10% height loss. 3. Mild lumbar spine spondylosis as described above. ASSESSMENT: Monette, Shields J; 51 y/o -old; M seen in clinic. Patient has subacute lumbar L3 compression fracture deformity which likely contributes or accounts for most of the low back pain. Based on cross-sectional imaging, this would be anatomically approachable for percutaneous  intervention. No associated spinal stenosis or other contraindications. Marrow heterogeneity suggesting infiltrative process resulting in pathologic fracture, therefore core biopsy sample could be obtained at the time of procedure for further characterization. Given the lack of adequate symptom relief with time and a fairly aggressive pain medication regimen, and limitations of activities of daily living, the patient is clinically an appropriate candidate for consideration of vertebral augmentation. I discussed with the patient the pathophysiology of vertebral compression fracture deformities; the stable nature of these which does not require emergent treatment; natural history which includes healing over some unpredictable number of months. We discussed treatment options including watchful waiting, surgical fixation, and  percutaneous kyphoplasty/vertebroplasty. We discussed in detail the percutaneous kyphoplasty technique, anticipated benefits, time course to symptom resolution, possible risks and side effects. We discussed his elevated risk of additional level fractures with or without vertebral augmentation. We discussed the long-term need for continued bone building therapy managed by the patient's PCP. He is also scheduled for bone marrow biopsy due to blood work abnormality suggesting possible plasma cell neoplasm. The patient seemed to understand, and did ask appropriate questions. RECOMMENDATIONS: The patient is motivated to proceed with treatment ASAP. Accordingly, we schedule percutaneous lumbar L3 percutaneous bone biopsy, OsteoCool RF ablation, and kyphoplasty under moderate sedation as an outpatient at the patient's convenience, pending carrier approval if needed. SUMMARY: Approximately 30 minutes were spent with the patient, of which 20 minutes I spent discussing pathologic subacute painful L3 compression fracture treatment options. The patient has suffered a fracture of the lumbar L3 vertebral body, probably pathologic. It is recommended that patients aged 71 years or older be evaluated for possible testing or treatment of osteoporosis. A copy of this consult report is sent to the patient's referring physician. Electronically Signed   By: Lucrezia Europe M.D.   On: 01/30/2022 08:48   DG Bone Survey Met  Result Date: 01/22/2022 CLINICAL DATA:  Evaluate for lytic lesions.  Monoclonal gammopathy. EXAM: METASTATIC BONE SURVEY COMPARISON:  Lumbar spine MRI 01/07/2022. FINDINGS: There is a heterogeneous lucent lesion within the proximal left humeral diaphysis measuring 2.6 x 4.9 cm. No other lucent lesions are identified. Mild compression deformity of L3 is stable. Soft tissues are within normal limits. IMPRESSION: 1. Heterogeneous lytic lesion in the proximal left humerus measuring 2.6 x 4.9 cm. 2. No acute fracture.  Electronically Signed   By: Ronney Asters M.D.   On: 01/22/2022 15:54    Labs:  CBC: Recent Labs    02/10/22 1030 02/17/22 0520 02/17/22 1546 02/18/22 0441 02/19/22 0745  WBC 7.8 7.7  --  7.9 7.6  HGB 12.8* 11.3* 11.6* 11.6* 10.9*  HCT 39.2 34.7* 35.9* 35.7* 32.5*  PLT 196 213  --  229 213    COAGS: Recent Labs    02/17/22 0520  INR 1.0    BMP: Recent Labs    01/14/22 1605 02/17/22 0520 02/18/22 0441 02/19/22 0745  NA 131* 133* 132* 128*  K 4.2 4.0 4.0 4.1  CL 104 103 104 98  CO2 25 21* 25 24  GLUCOSE 114* 116* 103* 93  BUN $Re'13 19 11 13  'Rrp$ CALCIUM 9.2 9.6 9.5 8.8*  CREATININE 0.91 1.17 0.93 1.00  GFRNONAA >60 >60 >60 >60    LIVER FUNCTION TESTS: Recent Labs    01/04/22 1650 01/14/22 1605 02/17/22 0520 02/19/22 0745  BILITOT 0.4 0.3 0.5 0.8  AST 33 $Remo'21 30 22  'QFgrO$ ALT 31 22 33 30  ALKPHOS 53 56 47 46  PROT 10.2* 9.5*  9.4* 9.1*  ALBUMIN 3.4* 3.7 2.9* 2.6*    TUMOR MARKERS: No results for input(s): "AFPTM", "CEA", "CA199", "CHROMGRNA" in the last 8760 hours.  Assessment and Plan: L1 and L2 pathologic compression fractures in the setting of multiple myeloma. Patient admitted with acute, unrelenting back pain.  Found to have 2 new compression fractures at L1 and L2.  Case reviewed for RFA with cement augmentation and approved by Dr. Karenann Cai.  Patient last received lovenox yesterday afternoon-- dose held for today.  Patient has been NPO today.  He is aware of the goals of the procedure.   Risks and benefits were discussed with the patient including, but not limited to education regarding the natural healing process of compression fractures without intervention, bleeding, infection, cement migration which may cause spinal cord damage, paralysis, pulmonary embolism or even death.  This interventional procedure involves the use of X-rays and because of the nature of the planned procedure, it is possible that we will have prolonged use of X-ray  fluoroscopy.  Potential radiation risks to you include (but are not limited to) the following: - A slightly elevated risk for cancer several years later in life. This risk is typically less than 0.5% percent. This risk is low in comparison to the normal incidence of human cancer, which is 33% for women and 50% for men according to the [place]. - Radiation induced injury can include skin redness, resembling a rash, tissue breakdown / ulcers and hair loss (which can be temporary or permanent).   The likelihood of either of these occurring depends on the difficulty of the procedure and whether you are sensitive to radiation due to previous procedures, disease, or genetic conditions.   IF your procedure requires a prolonged use of radiation, you will be notified and given written instructions for further action.  It is your responsibility to monitor the irradiated area for the 2 weeks following the procedure and to notify your physician if you are concerned that you have suffered a radiation induced injury.    All of the patient's questions were answered, patient is agreeable to proceed.  Consent signed and in chart.  Thank you for this interesting consult.  I greatly enjoyed meeting MASSAI HANKERSON and look forward to participating in their care.  A copy of this report was sent to the requesting provider on this date.  Electronically Signed: Docia Barrier, PA 02/19/2022, 12:53 PM   I spent a total of 40 Minutes    in face to face in clinical consultation, greater than 50% of which was counseling/coordinating care for L1/L2 compression fractures.

## 2022-02-19 NOTE — Progress Notes (Signed)
Occupational Therapy Treatment Patient Details Name: [name] MRN: [id number] DOB: 10/12/1970 Today's Date: 02/19/2022   History of present illness 51 yo male presents to [facility] on 9/25 with fall, + head trauma. CT scan of the head and cervical spine noted widespread cervical and visible upper thoracic vertebral involvement by multiple myeloma without any focal cervical fracture; L1-2 compression fx. X-rays of the left shoulder revealed oblique displaced fracture of the proximal left humeral shaft with associated lucent lesion concerning for pathologic compression fracture. PMH includes kyphoplasty for L3 compression fx on 9/13, hypertension, hyperlipidemia, GERD, tobacco abuse, and recent diagnosis of multiple myeloma with LUE lesions.   OT comments  Pt making good progress with all adls. Pt appears to be in less pain today.  Was able to donn TLSO brace with sling over the top. Pt continues to have L arm pain that will limit all adls.  Unusure if pt will have the assist needed at home to be able to donn back brace and sling with the use of only one arm. Pt states he has some help but not 24/7. Will continue to focus on seeing if it is possible to do this and increase independence with all adls.    Recommendations for follow up therapy are one component of a multi-disciplinary discharge planning process, led by the attending physician.  Recommendations may be updated based on patient status, additional functional criteria and insurance authorization.    Follow Up Recommendations  Home health OT    Assistance Recommended at Discharge Frequent or constant Supervision/Assistance  Patient can return home with the following  Two people to help with walking and/or transfers;Two people to help with bathing/dressing/bathroom;Assist for transportation;Help with stairs or ramp for entrance   Equipment Recommendations  Other (comment) (tbd)    Recommendations for Other Services      Precautions /  Restrictions Precautions Precautions: Back;Fall Precaution Comments: reviewed back precautions and LUE NWB precautions. Required Braces or Orthoses: Spinal Brace;Sling Spinal Brace: Thoracolumbosacral orthotic;Applied in sitting position Restrictions Weight Bearing Restrictions: Yes LUE Weight Bearing: Non weight bearing Other Position/Activity Restrictions: ok for L elbow flexion/extension exercises per secure chat with Ortho PA Silvestre Gunner       Mobility Bed Mobility Overal bed mobility: Needs Assistance Bed Mobility: Sidelying to Sit, Rolling Rolling: Min assist Sidelying to sit: Min assist, HOB elevated       General bed mobility comments: Pt instructed in log rolling and used bed controls to assist him up in bed to control pain    Transfers Overall transfer level: Needs assistance Equipment used: 1 person hand held assist Transfers: Sit to/from Stand, Bed to chair/wheelchair/BSC Sit to Stand: Min assist     Step pivot transfers: Min assist     General transfer comment: Pt wears TLSO under sling and was able to transfer well with bed level raised.     Balance Overall balance assessment: Needs assistance Sitting-balance support: Single extremity supported, Feet supported Sitting balance-Leahy Scale: Good     Standing balance support: Single extremity supported, During functional activity Standing balance-Leahy Scale: Fair Standing balance comment: Pt walked to bathroom with hand held assist. May benefit from a cane.                           ADL either performed or assessed with clinical judgement   ADL Overall ADL's : Needs assistance/impaired Eating/Feeding: Set up;Sitting Eating/Feeding Details (indicate cue type and reason): in chair Grooming:  Wash/dry hands;Wash/dry face;Oral care;Set up;Standing Grooming Details (indicate cue type and reason): Pt stood at sink to groom. Cues given since not using L hand due to pain                  Toilet Transfer: Minimal assistance;Ambulation;Regular Glass blower/designer Details (indicate cue type and reason): Pt walked to bathroom and stood to urinate with supervision. Toileting- Clothing Manipulation and Hygiene: Minimal assistance;Sit to/from stand       Functional mobility during ADLs: Minimal assistance General ADL Comments: Pt in much less pain today and moving better for all adls.    Extremity/Trunk Assessment Upper Extremity Assessment Upper Extremity Assessment: LUE deficits/detail LUE Deficits / Details: LUE immobilized in sling LUE: Unable to fully assess due to pain;Unable to fully assess due to immobilization LUE Sensation: WNL LUE Coordination: decreased gross motor   Lower Extremity Assessment Lower Extremity Assessment: Defer to PT evaluation        Vision   Vision Assessment?: No apparent visual deficits   Perception Perception Perception: Within Functional Limits   Praxis Praxis Praxis: Intact    Cognition Arousal/Alertness: Awake/alert Behavior During Therapy: WFL for tasks assessed/performed Overall Cognitive Status: Within Functional Limits for tasks assessed                                          Exercises      Shoulder Instructions       General Comments Pt making good progress.    Pertinent Vitals/ Pain       Pain Assessment Pain Assessment: 0-10 Pain Score: 8  Pain Location: LUE and back with bed mobility Pain Descriptors / Indicators: Guarding, Aching, Discomfort, Sharp Pain Intervention(s): Limited activity within patient's tolerance, Monitored during session, Premedicated before session, Repositioned  Home Living                                          Prior Functioning/Environment              Frequency  Min 2X/week        Progress Toward Goals  OT Goals(current goals can now be found in the care plan section)  Progress towards OT goals: Progressing toward  goals  Acute Rehab OT Goals Patient Stated Goal: to get more independent OT Goal Formulation: With patient Time For Goal Achievement: 03/04/22 Potential to Achieve Goals: Good ADL Goals Pt Will Perform Grooming: with supervision;standing Pt Will Perform Upper Body Dressing: with min assist;sitting Pt Will Perform Lower Body Dressing: with supervision;sit to/from stand Pt Will Transfer to Toilet: with supervision;ambulating Pt Will Perform Toileting - Clothing Manipulation and hygiene: with supervision;sit to/from stand  Plan Discharge plan remains appropriate    Co-evaluation                 AM-PAC OT "6 Clicks" Daily Activity     Outcome Measure   Help from another person eating meals?: None Help from another person taking care of personal grooming?: A Little Help from another person toileting, which includes using toliet, bedpan, or urinal?: A Little Help from another person bathing (including washing, rinsing, drying)?: A Lot Help from another person to put on and taking off regular upper body clothing?: A Lot Help from another person to put on and taking  off regular lower body clothing?: A Lot 6 Click Score: 16    End of Session Equipment Utilized During Treatment: Other (comment) (sling/tlso)  OT Visit Diagnosis: Unsteadiness on feet (R26.81);Muscle weakness (generalized) (M62.81);History of falling (Z91.81);Pain Pain - Right/Left: Left Pain - part of body: Shoulder   Activity Tolerance Patient tolerated treatment well   Patient Left in chair;with call bell/phone within reach   Nurse Communication Mobility status        Time: 6943-7005 OT Time Calculation (min): 30 min  Charges: OT General Charges $OT Visit: 1 Visit OT Treatments $Self Care/Home Management : 23-37 mins   Glenford Peers 02/19/2022, 11:11 AM

## 2022-02-20 ENCOUNTER — Inpatient Hospital Stay (HOSPITAL_COMMUNITY): Payer: BC Managed Care – PPO

## 2022-02-20 ENCOUNTER — Telehealth: Payer: Self-pay | Admitting: *Deleted

## 2022-02-20 DIAGNOSIS — S32010A Wedge compression fracture of first lumbar vertebra, initial encounter for closed fracture: Secondary | ICD-10-CM | POA: Diagnosis not present

## 2022-02-20 DIAGNOSIS — S42332A Displaced oblique fracture of shaft of humerus, left arm, initial encounter for closed fracture: Secondary | ICD-10-CM | POA: Diagnosis not present

## 2022-02-20 DIAGNOSIS — M84622A Pathological fracture in other disease, left humerus, initial encounter for fracture: Secondary | ICD-10-CM | POA: Diagnosis not present

## 2022-02-20 DIAGNOSIS — S32020A Wedge compression fracture of second lumbar vertebra, initial encounter for closed fracture: Secondary | ICD-10-CM | POA: Diagnosis not present

## 2022-02-20 LAB — COMPREHENSIVE METABOLIC PANEL
ALT: 29 U/L (ref 0–44)
AST: 28 U/L (ref 15–41)
Albumin: 2.7 g/dL — ABNORMAL LOW (ref 3.5–5.0)
Alkaline Phosphatase: 54 U/L (ref 38–126)
Anion gap: 12 (ref 5–15)
BUN: 13 mg/dL (ref 6–20)
CO2: 26 mmol/L (ref 22–32)
Calcium: 10.5 mg/dL — ABNORMAL HIGH (ref 8.9–10.3)
Chloride: 95 mmol/L — ABNORMAL LOW (ref 98–111)
Creatinine, Ser: 1.08 mg/dL (ref 0.61–1.24)
GFR, Estimated: >60 mL/min (ref >60)
Glucose, Bld: 99 mg/dL (ref 70–99)
Potassium: 4.2 mmol/L (ref 3.5–5.1)
Sodium: 133 mmol/L — ABNORMAL LOW (ref 135–145)
Total Bilirubin: 0.7 mg/dL (ref 0.3–1.2)
Total Protein: 9.7 g/dL — ABNORMAL HIGH (ref 6.5–8.1)

## 2022-02-20 LAB — PROCALCITONIN: Procalcitonin: 0.18 ng/mL

## 2022-02-20 LAB — URINALYSIS, ROUTINE W REFLEX MICROSCOPIC
Bilirubin Urine: NEGATIVE
Glucose, UA: NEGATIVE mg/dL
Hgb urine dipstick: NEGATIVE
Ketones, ur: NEGATIVE mg/dL
Leukocytes,Ua: NEGATIVE
Nitrite: NEGATIVE
Protein, ur: NEGATIVE mg/dL
Specific Gravity, Urine: 1.013 (ref 1.005–1.030)
pH: 5 (ref 5.0–8.0)

## 2022-02-20 LAB — OSMOLALITY: Osmolality: 286 mOsm/kg (ref 275–295)

## 2022-02-20 LAB — CBC WITH DIFFERENTIAL/PLATELET
Abs Immature Granulocytes: 0.05 10*3/uL (ref 0.00–0.07)
Basophils Absolute: 0 10*3/uL (ref 0.0–0.1)
Basophils Relative: 0 %
Eosinophils Absolute: 0.1 10*3/uL (ref 0.0–0.5)
Eosinophils Relative: 1 %
HCT: 33.5 % — ABNORMAL LOW (ref 39.0–52.0)
Hemoglobin: 11 g/dL — ABNORMAL LOW (ref 13.0–17.0)
Immature Granulocytes: 1 %
Lymphocytes Relative: 28 %
Lymphs Abs: 1.7 10*3/uL (ref 0.7–4.0)
MCH: 29.5 pg (ref 26.0–34.0)
MCHC: 32.8 g/dL (ref 30.0–36.0)
MCV: 89.8 fL (ref 80.0–100.0)
Monocytes Absolute: 0.4 10*3/uL (ref 0.1–1.0)
Monocytes Relative: 6 %
Neutro Abs: 4.1 10*3/uL (ref 1.7–7.7)
Neutrophils Relative %: 64 %
Platelets: 215 10*3/uL (ref 150–400)
RBC: 3.73 MIL/uL — ABNORMAL LOW (ref 4.22–5.81)
RDW: 13 % (ref 11.5–15.5)
WBC: 6.3 10*3/uL (ref 4.0–10.5)
nRBC: 0 % (ref 0.0–0.2)

## 2022-02-20 LAB — SODIUM, URINE, RANDOM: Sodium, Ur: 103 mmol/L

## 2022-02-20 LAB — CREATININE, URINE, RANDOM: Creatinine, Urine: 117 mg/dL

## 2022-02-20 LAB — MAGNESIUM: Magnesium: 2 mg/dL (ref 1.7–2.4)

## 2022-02-20 LAB — OSMOLALITY, URINE: Osmolality, Ur: 534 mOsm/kg (ref 300–900)

## 2022-02-20 LAB — BRAIN NATRIURETIC PEPTIDE: B Natriuretic Peptide: 22.9 pg/mL (ref 0.0–100.0)

## 2022-02-20 MED ORDER — FUROSEMIDE 40 MG PO TABS
40.0000 mg | ORAL_TABLET | Freq: Once | ORAL | Status: AC
  Administered 2022-02-20: 40 mg via ORAL
  Filled 2022-02-20: qty 1

## 2022-02-20 MED ORDER — MORPHINE SULFATE ER 30 MG PO TBCR
60.0000 mg | EXTENDED_RELEASE_TABLET | Freq: Two times a day (BID) | ORAL | Status: DC
  Administered 2022-02-20 – 2022-02-24 (×8): 60 mg via ORAL
  Filled 2022-02-20 (×8): qty 2

## 2022-02-20 NOTE — Progress Notes (Signed)
PROGRESS NOTE                                                                                                                                                                                                             Patient Demographics:    [name], is a 51 y.o. male, DOB - 1970/11/07, WPY:[id number]  Outpatient Primary MD for the patient is [name], MD    LOS - 2  Admit date - 02/17/2022    Chief Complaint  Patient presents with   Fall       Brief Narrative (HPI from H&P)   51 y.o. male with medical history significant of hypertension, hyperlipidemia, GERD, tobacco abuse, and recent diagnosis of multiple myeloma followed by Dr. Lorenso Courier of oncology presents complaining of lower back and left arm pain.  He had gotten up to use the restroom early this morning after he had watched the football games.  He had some pain in his lower back upon sitting up and switching positions to stand up, but pain was sharp and severe causing him to fall.  He tried to brace himself with his left arm, but developed severe pain in the left shoulder.  Patient fell onto the bed and then into the floor.  The ER his work-up was suggestive of left humerus fracture which was acute, some extension of his old L3 compression fracture and new L1 and L2 compression fractures.  He had excruciating lower back pain pain and was admitted for further care.   Subjective:   Patient in bed, appears comfortable, denies any headache, no fever, no chest pain or pressure, no shortness of breath , no abdominal pain. No new focal weakness.  Low back pain better, continues to have some left arm pain.   Assessment  & Plan :    Pathological proximal left humeral fracture, lumbar L1-L2 and extension of recent kyphoplasty L3 fracture in a patient with multiple myeloma.   He lives alone his left arm is in a sling due to fracture and is currently having excruciating  low back pain and unable to get out of the bed, case discussed with neurosurgeon Dr. Elwin Sleight, he was seen by IR underwent kyphoplasty on 02/19/2022 with improvement in back pain.  For now TLSO brace, pain control and PT OT.  Weightbearing as tolerated, advance activity since  he lives alone wants to spend another day in the hospital before he is comfortable going home with home PT OT.  If stable will try discharge him on 02/21/2022.  Orthopedics following for left proximal humerus fracture for now they have recommended a sling with outpatient follow-up for surgical correction.  Case discussed with orthopedics on 02/20/2022 most likely outpatient follow-up with Dr. Stann Mainland with outpatient intervention.  Have adjusted narcotics for better weight control on 02/20/2022.    Multiple myeloma with normocytic anemia.  Case discussed with his oncologist Dr. Lorenso Courier he will follow-up postdischarge.  Dehydration and hyponatremia.  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  Fluid restriction gentle Lasix and monitor.  GERD.  On PPI.  Obesity.  BMI 36.  Follow with PCP for weight loss.      Condition - Fair  Family Communication  :  None  Code Status :  Full  Consults  : Orthopedics, neurosurgery Dr. Pieter Partridge Dawley, oncologist Dr. Lorenso Courier, IR  PUD Prophylaxis : PPI   Procedures  :     CT - L spine showing compression fractures at L1 - L2 and L3.      Disposition Plan  :    Status is: Observation  DVT Prophylaxis  :    Place and maintain sequential compression device Start: 02/19/22 0839 enoxaparin (LOVENOX) injection 40 mg Start: 02/17/22 1600   Lab Results  Component Value Date   PLT 215 02/20/2022    Diet :  Diet Order             Diet regular Room service appropriate? Yes; Fluid consistency: Thin  Diet effective now                    Inpatient Medications  Scheduled Meds:  docusate sodium  200 mg Oral BID   enoxaparin (LOVENOX) injection  40 mg  Subcutaneous Q24H   lisinopril  40 mg Oral Daily   morphine  60 mg Oral Q12H   multivitamin with minerals  1 tablet Oral Daily   pantoprazole  40 mg Oral Daily   polyethylene glycol  17 g Oral Daily   Continuous Infusions:   PRN Meds:.acetaminophen, albuterol, bisacodyl, hydrALAZINE, naLOXone (NARCAN)  injection, ondansetron **OR** ondansetron (ZOFRAN) IV, oxyCODONE  Antibiotics  :    Anti-infectives (From admission, onward)    Start     Dose/Rate Route Frequency Ordered Stop   02/19/22 0800  ceFAZolin (ANCEF) IVPB 2g/100 mL premix        2 g 200 mL/hr over 30 Minutes Intravenous  Once 02/18/22 1635 02/19/22 1417        Time Spent in minutes  30   Lala Lund M.D on 02/20/2022 at 9:16 AM  To page go to [email or web address]   [facility] -  Office  [phone]  See all Orders from today for further details    Objective:   Vitals:   02/19/22 1934 02/20/22 0007 02/20/22 0431 02/20/22 0731  BP: (!) 147/88 (!) 150/85 110/76 107/71  Pulse: (!) 110 (!) 110 90 88  Resp: _0 Temp: 99.6 F (37.6 C) 99.8 F (37.7 C) 99.5 F (37.5 C) 98.3 F (36.8 C)  TempSrc: Oral Oral Oral Oral  SpO2: 96%  95% 96%    Wt Readings from Last 3 Encounters:  02/13/22 111.9 kg  02/10/22 110.6 kg  02/05/22 110.7 kg     Intake/Output Summary (Last 24 hours) at 02/20/2022 7116 Last data filed  at 02/20/2022 0535 Gross per 24 hour  Intake 100 ml  Output 1375 ml  Net -1275 ml     Physical Exam  Awake Alert, No new F.N deficits, Normal affect [place].AT,PERRAL Supple Neck, No JVD,   Symmetrical Chest wall movement, Good air movement bilaterally, CTAB RRR,No Gallops, Rubs or new Murmurs,  +ve B.Sounds, Abd Soft, No tenderness,    Left arm in sling, low back pain is better   Data Review:    CBC Recent Labs  Lab 02/17/22 0520 02/17/22 1546 02/18/22 0441 02/19/22 0745 02/20/22 0453  WBC 7.7  --  7.9 7.6 6.3  HGB 11.3* 11.6* 11.6* 10.9* 11.0*  HCT 34.7* 35.9* 35.7*  32.5* 33.5*  PLT 213  --  229 213 215  MCV 89.4  --  89.3 88.6 89.8  MCH 29.1  --  29.0 29.7 29.5  MCHC 32.6  --  32.5 33.5 32.8  RDW 13.0  --  13.0 13.0 13.0  LYMPHSABS 2.3  --   --  2.8 1.7  MONOABS 0.5  --   --  0.5 0.4  EOSABS 0.0  --   --  0.1 0.1  BASOSABS 0.0  --   --  0.0 0.0    Electrolytes Recent Labs  Lab 02/17/22 0520 02/18/22 0441 02/19/22 0745 02/20/22 0453 02/20/22 0623  NA 133* 132* 128* 133*  --   K 4.0 4.0 4.1 4.2  --   CL 103 104 98 95*  --   CO2 21* _0 --   GLUCOSE 116* 103* 93 99  --   BUN _1 --   CREATININE 1.17 0.93 1.00 1.08  --   CALCIUM 9.6 9.5 8.8* 10.5*  --   AST 30  --  22 28  --   ALT 33  --  30 29  --   ALKPHOS 47  --  46 54  --   BILITOT 0.5  --  0.8 0.7  --   ALBUMIN 2.9*  --  2.6* 2.7*  --   MG  --   --  1.8 2.0  --   PROCALCITON  --   --   --   --  0.18  INR 1.0  --   --   --   --   BNP  --   --  25.8 22.9  --     ------------------------------------------------------------------------------------------------------------------ No results for input(s): "CHOL", "HDL", "LDLCALC", "TRIG", "CHOLHDL", "LDLDIRECT" in the last 72 hours.  Lab Results  Component Value Date   HGBA1C 5.9 (H) 12/05/2021     Radiology Reports DG Chest Port 1 View  Result Date: 02/20/2022 CLINICAL DATA:  Shortness of breath.  Fracture of left shoulder. EXAM: PORTABLE CHEST 1 VIEW COMPARISON:  02/17/2022 FINDINGS: Heart size and mediastinal contours are stable. Lungs are hypoinflated. Pulmonary vascular congestion. No pleural effusion or edema. No airspace disease. Pathologic fracture involving the proximal left humerus is noted. IMPRESSION: 1. Pulmonary vascular congestion. 2. Pathologic fracture involving the proximal left humerus. Electronically Signed   By: Kerby Moors M.D.   On: 02/20/2022 08:07   DG Shoulder Left  Result Date: 02/17/2022 CLINICAL DATA:  Fall EXAM: LEFT SHOULDER - 3 VIEW COMPARISON:  Shoulder radiograph dated February 04, 2022 FINDINGS: Oblique displaced fracture of the proximal left humeral shaft. Associated lucent lesion of the proximal left humerus is again seen. Associated soft tissue edema of the proximal left arm. Visualized lung is clear. IMPRESSION: Oblique displaced  fracture of the proximal left humeral shaft with associated lucent lesion, findings are concerning for pathologic compression fracture. Electronically Signed   By: Yetta Glassman M.D.   On: 02/17/2022 13:13   DG Pelvis Portable  Result Date: 02/17/2022 CLINICAL DATA:  Fall. EXAM: PORTABLE PELVIS 1-2 VIEWS COMPARISON:  CT abdomen and pelvis 07/21/2021 FINDINGS: No acute fracture or pelvic diastasis is identified. There is mild left hip joint space narrowing. IMPRESSION: No acute osseous abnormality identified. Electronically Signed   By: Logan Bores M.D.   On: 02/17/2022 13:09   DG Chest Portable 1 View  Result Date: 02/17/2022 CLINICAL DATA:  Fall. EXAM: PORTABLE CHEST 1 VIEW COMPARISON:  Chest radiographs 05/04/2019 FINDINGS: The cardiomediastinal silhouette is unchanged with normal heart size. Lung volumes are low with bronchovascular crowding. No confluent airspace opacity, overt pulmonary edema, sizable pleural effusion, or pneumothorax is identified. No acute osseous abnormality is seen. IMPRESSION: No active disease. Electronically Signed   By: Logan Bores M.D.   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 Low back pain. Status post recent L3 vertebral body biopsy, RF ablation, augmentation on 02/05/2022. Pathology revealed plasmacytoma. EXAM: CT LUMBAR SPINE WITHOUT CONTRAST TECHNIQUE: Multidetector CT imaging of the lumbar spine was performed without intravenous contrast administration. Multiplanar CT image reconstructions were also generated. RADIATION DOSE REDUCTION: This exam was performed according to the departmental  dose-optimization program which includes automated exposure control, adjustment of the mA and/or kV according to patient size and/or use of iterative reconstruction technique. COMPARISON:  Lumbar MRI 01/07/2022. CT Abdomen and Pelvis 07/21/2021. FINDINGS: Segmentation: Normal. Alignment: Stable lumbar lordosis. Vertebrae: Pronounced heterogeneity of bone mineralization since February this year. Diffuse lytic vertebral and visible pelvic bone lesions. Mild pathologic compression fracture of L1. More moderate pathologic compression fracture of L2, with lucency of the anterior superior endplate and 95% central loss of vertebral body height compared to February. No retropulsion of bone. L2 pedicles and posterior elements appear intact. Augmented moderate to severe L3 pathologic compression fracture. No significant L4 vertebral compression. Subtle L5 pathologic vertebral compression. Visible sacral ala appear intact despite myelomas involvement. Paraspinal and other soft tissues: Negative visible noncontrast abdominal viscera. Lumbar paraspinal soft tissues remain within normal limits. Disc levels: Lumbar spine degeneration predominantly moderate to severe facet arthropathy at L4-L5 where there is subtle chronic grade 1 anterolisthesis. IMPRESSION: 1. Diffuse Multiple Myeloma involvement of the visible spine and pelvis, new since a February CT. 2. Augmented compression fracture of L3. Mild-to-moderate pathologic compression fracture of L2 (22% loss of vertebral body height, no complicating features). Mild pathologic compression fracture of L1. Electronically Signed   By: Genevie Ann M.D.   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 Low back pain. Status post recent L3 vertebral body biopsy, RF ablation, augmentation on 02/05/2022. Pathology revealed plasmacytoma. Neck pain. EXAM: CT CERVICAL SPINE WITHOUT  CONTRAST TECHNIQUE: Multidetector CT imaging of the cervical spine was performed without intravenous contrast. Multiplanar CT image reconstructions were also generated. RADIATION DOSE REDUCTION: This exam was performed according to the departmental dose-optimization program which includes automated exposure control, adjustment of the mA and/or kV according to patient size and/or use of iterative reconstruction technique. COMPARISON:  Cervical spine radiographs 12/18/2021. FINDINGS: Alignment: Straightening of cervical lordosis. Cervicothoracic junction alignment is within normal limits. Bilateral posterior element alignment is within  normal limits. Skull base and vertebrae: Visualized skull base is intact. No atlanto-occipital dissociation. C1 and C2 appear intact and aligned. Widely scattered ill-defined lucent lesions are present elsewhere in the cervical spine and visible upper thoracic spine C3 through T2. No cervical pathologic fracture is identified. Posterior element involvement is noted on the right at C4 (series 4, image 44). No expansile cervical lesion at this time. Soft tissues and spinal canal: No prevertebral fluid or swelling. No visible canal hematoma. Negative noncontrast visible neck soft tissues. Disc levels: Widespread superimposed cervical spine disc and endplate degeneration. Multilevel degenerative cervical spinal stenosis, mild to moderate at C5-C6. Upper chest: T1 and T2 vertebral myeloma thus involvement, including an indistinct 10 mm lucent lesion of the left T1 body. Lung apices are clear. Negative visible noncontrast superior mediastinum. Other: Negative visible noncontrast posterior fossa. Visible paranasal sinuses, tympanic cavities and mastoids are well aerated. IMPRESSION: 1. Widespread cervical and visible upper thoracic vertebral involvement by Multiple Myeloma. No cervical pathologic fracture. 2. Superimposed bulky cervical spine degeneration. Subsequent mild to moderate  degenerative spinal stenosis suspected at C5-C6. Electronically Signed   By: Genevie Ann M.D.   On: 02/17/2022 09:41

## 2022-02-20 NOTE — Telephone Encounter (Signed)
Received call from patient regarding his Revlimid. He is currently in the hospital. He had kyphoplasty yesterday and will be having his left upper arm fracture repaired tomorrow. He states his Revlimid has arrived at his home. He was wondering if and when he should start taking it.  Advised to not take it at this time.  He has to have a tooth looked at prior to starting his treatment for multiple myeloma. Once he is recovered for tomorrow's surgery and has his tooth issue addressed we will be able to start his treatment. Advised to keep the Revlimid in a safe place at home and Dr. Lorenso Courier will advise when he will start treatment. Pt voiced understanding.

## 2022-02-20 NOTE — Progress Notes (Signed)
Physical Therapy Treatment Patient Details Name: [name] MRN: [id number] DOB: Oct 18, 1970 Today's Date: 02/20/2022   History of Present Illness 51 yo male presents to [facility] on 9/25 with fall, + head trauma. CT scan of the head and cervical spine noted widespread cervical and visible upper thoracic vertebral involvement by multiple myeloma without any focal cervical fracture; L1-2 compression fx. X-rays of the left shoulder revealed oblique displaced fracture of the proximal left humeral shaft with associated lucent lesion concerning for pathologic compression fracture.   s/p FLUOROSCOPY GUIDED L1 AND L2 RADIOFREQUENCY ABLATION AND BALLOON KYPHOPLASTY on 9/27. PMH includes kyphoplasty for L3 compression fx on 9/13, hypertension, hyperlipidemia, GERD, tobacco abuse, and recent diagnosis of multiple myeloma with LUE lesions.    PT Comments    Pt s/p kyphoplasty of L1-2 yesterday, reports back pain is significantly less and he has been able to mobilize some in room. Pt ambulatory in hallway without AD, demonstrating improving activity tolerance and less pain with mobility. Pt with good maintenance of spinal precautions with PT verbally reinforcing precautions throughout. Pt's sister present at bedside,very supportive.      Recommendations for follow up therapy are one component of a multi-disciplinary discharge planning process, led by the attending physician.  Recommendations may be updated based on patient status, additional functional criteria and insurance authorization.  Follow Up Recommendations  Home health PT     Assistance Recommended at Discharge Intermittent Supervision/Assistance  Patient can return home with the following A little help with walking and/or transfers;A little help with bathing/dressing/bathroom   Equipment Recommendations  BSC/3in1    Recommendations for Other Services       Precautions / Restrictions Precautions Precautions: Back;Fall Precaution Comments:  reviewed back precautions Required Braces or Orthoses: Spinal Brace;Sling Spinal Brace: Thoracolumbosacral orthotic;Applied in sitting position Restrictions Weight Bearing Restrictions: Yes LUE Weight Bearing: Non weight bearing Other Position/Activity Restrictions: ok for L elbow flexion/extension exercises per secure chat with Ortho PA Silvestre Gunner     Mobility  Bed Mobility Overal bed mobility: Needs Assistance Bed Mobility: Rolling, Sidelying to Sit Rolling: Supervision Sidelying to sit: Min assist       General bed mobility comments: light assist to elevate trunk, sequencing cues needed    Transfers Overall transfer level: Needs assistance Equipment used: None Transfers: Sit to/from Stand Sit to Stand: Min guard           General transfer comment: for safety, increased time to rise and sit    Ambulation/Gait Ambulation/Gait assistance: Min guard Gait Distance (Feet): 200 Feet Assistive device: None Gait Pattern/deviations: Step-through pattern, Decreased stride length Gait velocity: decr     General Gait Details: slowed but steady gait   Stairs             Wheelchair Mobility    Modified Rankin (Stroke Patients Only)       Balance Overall balance assessment: Needs assistance Sitting-balance support: Single extremity supported, Feet supported Sitting balance-Leahy Scale: Good     Standing balance support: Single extremity supported, During functional activity Standing balance-Leahy Scale: Fair                              Cognition Arousal/Alertness: Awake/alert Behavior During Therapy: WFL for tasks assessed/performed Overall Cognitive Status: Within Functional Limits for tasks assessed  Exercises      General Comments        Pertinent Vitals/Pain Pain Assessment Pain Assessment: Faces Faces Pain Scale: Hurts a little bit Pain Location: back Pain  Descriptors / Indicators: Sore Pain Intervention(s): Repositioned, Limited activity within patient's tolerance, Monitored during session    Home Living                          Prior Function            PT Goals (current goals can now be found in the care plan section) Acute Rehab PT Goals PT Goal Formulation: With patient Time For Goal Achievement: 03/04/22 Potential to Achieve Goals: Good Progress towards PT goals: Progressing toward goals    Frequency    Min 3X/week      PT Plan Current plan remains appropriate    Co-evaluation              AM-PAC PT "6 Clicks" Mobility   Outcome Measure  Help needed turning from your back to your side while in a flat bed without using bedrails?: A Little Help needed moving from lying on your back to sitting on the side of a flat bed without using bedrails?: A Little Help needed moving to and from a bed to a chair (including a wheelchair)?: A Little Help needed standing up from a chair using your arms (e.g., wheelchair or bedside chair)?: A Little Help needed to walk in hospital room?: A Little Help needed climbing 3-5 steps with a railing? : A Little 6 Click Score: 18    End of Session Equipment Utilized During Treatment: Back brace Activity Tolerance: Patient tolerated treatment well Patient left: with call bell/phone within reach;Other (comment) (up in bathroom, NT to assist with washup and in room) Nurse Communication: Mobility status PT Visit Diagnosis: Other abnormalities of gait and mobility (R26.89);Muscle weakness (generalized) (M62.81)     Time: 1224-4975 PT Time Calculation (min) (ACUTE ONLY): 13 min  Charges:  $Therapeutic Activity: 8-22 mins                     Stacie Glaze, PT DPT Acute Rehabilitation Services Pager [phone]  Office [phone]   Fallon E Ruffin Pyo 02/20/2022, 11:15 AM

## 2022-02-20 NOTE — Progress Notes (Signed)
Referring Physician(s): Dorsey,John T IV  Supervising Physician: Pedro Earls  Patient Status:  [facility] - In-pt  Chief Complaint: L1 & L2 compression fractures s/p radiofrequency ablation and balloon kyphoplasty  Subjective: Sitting up in the chair, no discomfort or distress observed. Patient denies any back pain. Male visitor/family member at the bedside.   Allergies: Patient has no known allergies.  Medications: Prior to Admission medications   Medication Sig Start Date End Date Taking? Authorizing Provider  acetaminophen (TYLENOL) 500 MG tablet Take 500 mg by mouth every 6 (six) hours as needed for mild pain or moderate pain.   Yes [provider]  hydrochlorothiazide (HYDRODIURIL) 25 MG tablet Take 1 tablet (25 mg total) by mouth daily. 12/05/21  Yes [name], MD  ibuprofen (ADVIL) 600 MG tablet Take 1 tablet (600 mg total) by mouth every 6 (six) hours as needed. Patient taking differently: Take 600 mg by mouth every 6 (six) hours as needed for mild pain or moderate pain. 01/04/22  Yes Dion Saucier A, PA  lisinopril (ZESTRIL) 40 MG tablet Take 1 tablet (40 mg total) by mouth daily. 12/05/21  Yes [name], MD  oxyCODONE (OXY IR/ROXICODONE) 5 MG immediate release tablet Take 1 tablet (5 mg total) by mouth every 4 (four) hours as needed for severe pain. 02/13/22  Yes Orson Slick, MD  pantoprazole (PROTONIX) 40 MG tablet Take 1 tablet (40 mg total) by mouth daily. 12/05/21  Yes [name], MD  docusate sodium (COLACE) 100 MG capsule Take 1 capsule (100 mg total) by mouth every 12 (twelve) hours. Patient not taking: Reported on 02/17/2022 02/04/22   Valarie Merino, MD  lenalidomide (REVLIMID) 25 MG capsule Take 1 capsule (25 mg total) by mouth daily. Take for 14 days and then none for 7 days. Repeat every 21 days. [employer] Auth # [id number] Date Obtained 02/13/22 Patient not taking: Reported on 02/17/2022 02/14/22   Orson Slick, MD   rosuvastatin (CRESTOR) 10 MG tablet Take 1 tablet (10 mg total) by mouth daily. Patient not taking: Reported on 02/17/2022 12/21/21   [name], MD     Vital Signs: BP 107/71 (BP Location: Right Arm)   Pulse 88   Temp 98.3 F (36.8 C) (Oral)   Resp 15   SpO2 96%   Physical Exam Constitutional:      General: He is not in acute distress.    Appearance: He is not ill-appearing.  Pulmonary:     Effort: Pulmonary effort is normal.  Musculoskeletal:     Comments: Currently wearing arm sling/back brace. Patient denies back pain.   Skin:    General: Skin is warm and dry.  Neurological:     Mental Status: He is alert and oriented to person, place, and time.     Imaging: DG Chest Port 1 View  Result Date: 02/20/2022 CLINICAL DATA:  Shortness of breath.  Fracture of left shoulder. EXAM: PORTABLE CHEST 1 VIEW COMPARISON:  02/17/2022 FINDINGS: Heart size and mediastinal contours are stable. Lungs are hypoinflated. Pulmonary vascular congestion. No pleural effusion or edema. No airspace disease. Pathologic fracture involving the proximal left humerus is noted. IMPRESSION: 1. Pulmonary vascular congestion. 2. Pathologic fracture involving the proximal left humerus. Electronically Signed   By: Kerby Moors M.D.   On: 02/20/2022 08:07   DG Shoulder Left  Result Date: 02/17/2022 CLINICAL DATA:  Fall EXAM: LEFT SHOULDER - 3 VIEW COMPARISON:  Shoulder radiograph dated February 04, 2022 FINDINGS: Oblique  displaced fracture of the proximal left humeral shaft. Associated lucent lesion of the proximal left humerus is again seen. Associated soft tissue edema of the proximal left arm. Visualized lung is clear. IMPRESSION: Oblique displaced fracture of the proximal left humeral shaft with associated lucent lesion, findings are concerning for pathologic compression fracture. Electronically Signed   By: Yetta Glassman M.D.   On: 02/17/2022 13:13   DG Pelvis Portable  Result Date:  02/17/2022 CLINICAL DATA:  Fall. EXAM: PORTABLE PELVIS 1-2 VIEWS COMPARISON:  CT abdomen and pelvis 07/21/2021 FINDINGS: No acute fracture or pelvic diastasis is identified. There is mild left hip joint space narrowing. IMPRESSION: No acute osseous abnormality identified. Electronically Signed   By: Logan Bores M.D.   On: 02/17/2022 13:09   DG Chest Portable 1 View  Result Date: 02/17/2022 CLINICAL DATA:  Fall. EXAM: PORTABLE CHEST 1 VIEW COMPARISON:  Chest radiographs 05/04/2019 FINDINGS: The cardiomediastinal silhouette is unchanged with normal heart size. Lung volumes are low with bronchovascular crowding. No confluent airspace opacity, overt pulmonary edema, sizable pleural effusion, or pneumothorax is identified. No acute osseous abnormality is seen. IMPRESSION: No active disease. Electronically Signed   By: Logan Bores M.D.   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 Low back pain. Status post recent L3 vertebral body biopsy, RF ablation, augmentation on 02/05/2022. Pathology revealed plasmacytoma. EXAM: CT LUMBAR SPINE WITHOUT CONTRAST TECHNIQUE: Multidetector CT imaging of the lumbar spine was performed without intravenous contrast administration. Multiplanar CT image reconstructions were also generated. RADIATION DOSE REDUCTION: This exam was performed according to the departmental dose-optimization program which includes automated exposure control, adjustment of the mA and/or kV according to patient size and/or use of iterative reconstruction technique. COMPARISON:  Lumbar MRI 01/07/2022. CT Abdomen and Pelvis 07/21/2021. FINDINGS: Segmentation: Normal. Alignment: Stable lumbar lordosis. Vertebrae: Pronounced heterogeneity of bone mineralization since February this year. Diffuse lytic vertebral and visible pelvic bone lesions. Mild pathologic compression fracture of L1. More moderate  pathologic compression fracture of L2, with lucency of the anterior superior endplate and 29% central loss of vertebral body height compared to February. No retropulsion of bone. L2 pedicles and posterior elements appear intact. Augmented moderate to severe L3 pathologic compression fracture. No significant L4 vertebral compression. Subtle L5 pathologic vertebral compression. Visible sacral ala appear intact despite myelomas involvement. Paraspinal and other soft tissues: Negative visible noncontrast abdominal viscera. Lumbar paraspinal soft tissues remain within normal limits. Disc levels: Lumbar spine degeneration predominantly moderate to severe facet arthropathy at L4-L5 where there is subtle chronic grade 1 anterolisthesis. IMPRESSION: 1. Diffuse Multiple Myeloma involvement of the visible spine and pelvis, new since a February CT. 2. Augmented compression fracture of L3. Mild-to-moderate pathologic compression fracture of L2 (22% loss of vertebral body height, no complicating features). Mild pathologic compression fracture of L1. Electronically Signed   By: Genevie Ann M.D.   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 Low back pain. Status post recent L3 vertebral body biopsy, RF ablation, augmentation on 02/05/2022. Pathology revealed plasmacytoma. Neck pain. EXAM: CT CERVICAL SPINE WITHOUT CONTRAST TECHNIQUE: Multidetector CT imaging of the cervical spine was performed without intravenous contrast. Multiplanar CT image reconstructions were also generated. RADIATION DOSE REDUCTION: This exam was performed according to the departmental dose-optimization program which includes automated exposure control, adjustment of the mA and/or kV  according to patient size and/or use of iterative reconstruction technique. COMPARISON:  Cervical spine radiographs 12/18/2021. FINDINGS: Alignment: Straightening of  cervical lordosis. Cervicothoracic junction alignment is within normal limits. Bilateral posterior element alignment is within normal limits. Skull base and vertebrae: Visualized skull base is intact. No atlanto-occipital dissociation. C1 and C2 appear intact and aligned. Widely scattered ill-defined lucent lesions are present elsewhere in the cervical spine and visible upper thoracic spine C3 through T2. No cervical pathologic fracture is identified. Posterior element involvement is noted on the right at C4 (series 4, image 44). No expansile cervical lesion at this time. Soft tissues and spinal canal: No prevertebral fluid or swelling. No visible canal hematoma. Negative noncontrast visible neck soft tissues. Disc levels: Widespread superimposed cervical spine disc and endplate degeneration. Multilevel degenerative cervical spinal stenosis, mild to moderate at C5-C6. Upper chest: T1 and T2 vertebral myeloma thus involvement, including an indistinct 10 mm lucent lesion of the left T1 body. Lung apices are clear. Negative visible noncontrast superior mediastinum. Other: Negative visible noncontrast posterior fossa. Visible paranasal sinuses, tympanic cavities and mastoids are well aerated. IMPRESSION: 1. Widespread cervical and visible upper thoracic vertebral involvement by Multiple Myeloma. No cervical pathologic fracture. 2. Superimposed bulky cervical spine degeneration. Subsequent mild to moderate degenerative spinal stenosis suspected at C5-C6. Electronically Signed   By: Genevie Ann M.D.   On: 02/17/2022 09:41    Labs:  CBC: Recent Labs    02/17/22 0520 02/17/22 1546 02/18/22 0441 02/19/22 0745 02/20/22 0453  WBC 7.7  --  7.9 7.6 6.3  HGB 11.3* 11.6* 11.6* 10.9* 11.0*  HCT 34.7* 35.9* 35.7* 32.5* 33.5*  PLT 213  --  229 213 215    COAGS: Recent Labs    02/17/22 0520  INR 1.0    BMP: Recent Labs    02/17/22 0520 02/18/22 0441 02/19/22 0745 02/20/22 0453  NA 133* 132* 128* 133*  K  4.0 4.0 4.1 4.2  CL 103 104 98 95*  CO2 21* _0 GLUCOSE 116* 103* 93 99  BUN _1 CALCIUM 9.6 9.5 8.8* 10.5*  CREATININE 1.17 0.93 1.00 1.08  GFRNONAA >60 >60 >60 >60    LIVER FUNCTION TESTS: Recent Labs    01/14/22 1605 02/17/22 0520 02/19/22 0745 02/20/22 0453  BILITOT 0.3 0.5 0.8 0.7  AST _2 ALT 22 33 30 29  ALKPHOS 56 47 46 54  PROT 9.5* 9.4* 9.1* 9.7*  ALBUMIN 3.7 2.9* 2.6* 2.7*    Assessment and Plan:  Multiple myeloma; L1 & L2 compression fractures s/p radiofrequency ablation and balloon kyphoplasty   Patient sitting up in the chair this morning and currently denies back pain. Labs and vitals are within normal limits. Procedure site not examined due to patient wearing a back brace. Discussed with patient and his family member to have the nurse check the site when the patient gets back to bed and takes the brace off.   Please call [facility] with any questions/concerns.   Electronically Signed: Soyla Dryer, AGACNP-BC [phone] 02/20/2022, 10:10 AM   I spent a total of 15 Minutes at the the patient's bedside AND on the patient's hospital floor or unit, greater than 50% of which was counseling/coordinating care post RFA and kyphoplasty

## 2022-02-20 NOTE — TOC Progression Note (Signed)
Transition of Care [facility]) - Progression Note    Patient Details  Name: [name] MRN: [id number] Date of Birth: [birth year]  Transition of [facility]) CM/SW [place], RN Phone Number: 02/20/2022, 2:11 PM  Clinical Narrative:    CM spoke with the patient at the bedside regarding transitions of care needs.  The patient plans to discharge home with family tomorrow - temporary address noted in Epic.  The patient's sister will be providing home for assistance with ADL's - [facility] confirmed that they are able to provide therapy at the alternate address.  DME for bedside 3:1 was ordered from [employer] to deliver to the hospital room today - orders placed to be co-signed by attending MD.  CM will continue to follow the patient for discharge to home in next day - pending medical/surgical clearance.   Expected Discharge Plan: [place] Barriers to Discharge: Continued Medical Work up  Expected Discharge Plan and Services Expected Discharge Plan: [place]   Discharge Planning Services: CM Consult                       DME Agency: [facility] Date DME Agency Contacted: 02/19/22 Time DME Agency Contacted: [phone] Representative spoke with at DME Agency: Tommi Rumps, Maybee with [facility] HH Arranged: PT, OT           Social Determinants of Health (SDOH) Interventions    Readmission Risk Interventions    02/19/2022    3:30 PM  Readmission Risk Prevention Plan  Post Dischage Appt Complete  Medication Screening Complete  Transportation Screening Complete

## 2022-02-21 DIAGNOSIS — S32020A Wedge compression fracture of second lumbar vertebra, initial encounter for closed fracture: Secondary | ICD-10-CM | POA: Diagnosis not present

## 2022-02-21 DIAGNOSIS — M84622A Pathological fracture in other disease, left humerus, initial encounter for fracture: Secondary | ICD-10-CM | POA: Diagnosis not present

## 2022-02-21 DIAGNOSIS — S42332A Displaced oblique fracture of shaft of humerus, left arm, initial encounter for closed fracture: Secondary | ICD-10-CM | POA: Diagnosis not present

## 2022-02-21 DIAGNOSIS — S32010A Wedge compression fracture of first lumbar vertebra, initial encounter for closed fracture: Secondary | ICD-10-CM | POA: Diagnosis not present

## 2022-02-21 LAB — BASIC METABOLIC PANEL
Anion gap: 7 (ref 5–15)
BUN: 17 mg/dL (ref 6–20)
CO2: 25 mmol/L (ref 22–32)
Calcium: 9.4 mg/dL (ref 8.9–10.3)
Chloride: 100 mmol/L (ref 98–111)
Creatinine, Ser: 1.03 mg/dL (ref 0.61–1.24)
GFR, Estimated: >60 mL/min (ref >60)
Glucose, Bld: 100 mg/dL — ABNORMAL HIGH (ref 70–99)
Potassium: 4.2 mmol/L (ref 3.5–5.1)
Sodium: 132 mmol/L — ABNORMAL LOW (ref 135–145)

## 2022-02-21 LAB — CBC
HCT: 32.8 % — ABNORMAL LOW (ref 39.0–52.0)
Hemoglobin: 10.9 g/dL — ABNORMAL LOW (ref 13.0–17.0)
MCH: 29.5 pg (ref 26.0–34.0)
MCHC: 33.2 g/dL (ref 30.0–36.0)
MCV: 88.6 fL (ref 80.0–100.0)
Platelets: 204 10*3/uL (ref 150–400)
RBC: 3.7 MIL/uL — ABNORMAL LOW (ref 4.22–5.81)
RDW: 12.8 % (ref 11.5–15.5)
WBC: 6 10*3/uL (ref 4.0–10.5)
nRBC: 0 % (ref 0.0–0.2)

## 2022-02-21 LAB — URINE CULTURE

## 2022-02-21 LAB — UREA NITROGEN, URINE: Urea Nitrogen, Ur: 649 mg/dL

## 2022-02-21 LAB — ABO/RH: ABO/RH(D): A POS

## 2022-02-21 LAB — MAGNESIUM: Magnesium: 2.2 mg/dL (ref 1.7–2.4)

## 2022-02-21 LAB — TYPE AND SCREEN
ABO/RH(D): A POS
Antibody Screen: NEGATIVE

## 2022-02-21 NOTE — Progress Notes (Signed)
Occupational Therapy Treatment Patient Details Name: [name] MRN: [id number] DOB: Jan 05, 1971 Today's Date: 02/21/2022   History of present illness 51 yo male presents to [facility] on 9/25 with fall, + head trauma. CT scan of the head and cervical spine noted widespread cervical and visible upper thoracic vertebral involvement by multiple myeloma without any focal cervical fracture; L1-2 compression fx. X-rays of the left shoulder revealed oblique displaced fracture of the proximal left humeral shaft with associated lucent lesion concerning for pathologic compression fracture.   s/p FLUOROSCOPY GUIDED L1 AND L2 RADIOFREQUENCY ABLATION AND BALLOON KYPHOPLASTY on 9/27. PMH includes kyphoplasty for L3 compression fx on 9/13, hypertension, hyperlipidemia, GERD, tobacco abuse, and recent diagnosis of multiple myeloma with LUE lesions.   OT comments  Patient received in supine and agreeable to OT session. Patient able to get to EOB on right side of bed with min assist and required assistance to donn TLSO with brace donned while wearing sling. Mod assist to donn gown over back.  Patient able to ambulate to bathroom to stand at sink for grooming tasks with supervision. Patient states decreased back pain but LUE shoulder hurting more today. Patient is currently scheduled for shoulder surgery tomorrow. Acute OT to continue to follow.    Recommendations for follow up therapy are one component of a multi-disciplinary discharge planning process, led by the attending physician.  Recommendations may be updated based on patient status, additional functional criteria and insurance authorization.    Follow Up Recommendations  Home health OT    Assistance Recommended at Discharge Frequent or constant Supervision/Assistance  Patient can return home with the following  Assist for transportation;Help with stairs or ramp for entrance;A little help with walking and/or transfers;A lot of help with  bathing/dressing/bathroom   Equipment Recommendations  Other (comment) (TBD)    Recommendations for Other Services      Precautions / Restrictions Precautions Precautions: Back;Fall Precaution Comments: reviewed back precautions Required Braces or Orthoses: Spinal Brace;Sling Spinal Brace: Thoracolumbosacral orthotic;Applied in sitting position Restrictions Weight Bearing Restrictions: Yes LUE Weight Bearing: Non weight bearing Other Position/Activity Restrictions: ok for L elbow flexion/extension exercises per secure chat with Ortho PA Silvestre Gunner       Mobility Bed Mobility Overal bed mobility: Needs Assistance Bed Mobility: Rolling, Sidelying to Sit Rolling: Supervision Sidelying to sit: Min assist       General bed mobility comments: assistance to elevate trunk    Transfers Overall transfer level: Needs assistance Equipment used: None Transfers: Sit to/from Stand Sit to Stand: Min guard           General transfer comment: min guard for safety     Balance Overall balance assessment: Needs assistance Sitting-balance support: Single extremity supported, Feet supported Sitting balance-Leahy Scale: Good     Standing balance support: Single extremity supported, During functional activity Standing balance-Leahy Scale: Fair Standing balance comment: stood at sink with supervision for grooming tasks                           ADL either performed or assessed with clinical judgement   ADL Overall ADL's : Needs assistance/impaired     Grooming: Wash/dry hands;Wash/dry face;Oral care;Set up;Standing Grooming Details (indicate cue type and reason): supervision while standing at sink and patient able to perform all tasks with one handed technique         Upper Body Dressing : Moderate assistance;Standing Upper Body Dressing Details (indicate cue type and reason): donned  gown to cover back side, max assist to donn back brace     Toilet  Transfer: Supervision/safety;Ambulation;Regular Glass blower/designer Details (indicate cue type and reason): Pt walked to bathroom and stood to urinate with supervision.           General ADL Comments: decreased pain in back but increased pain at LUE shoulder    Extremity/Trunk Assessment Upper Extremity Assessment LUE Deficits / Details: LUE immobilized in sling LUE Sensation: WNL LUE Coordination: decreased gross motor            Vision       Perception     Praxis      Cognition Arousal/Alertness: Awake/alert Behavior During Therapy: WFL for tasks assessed/performed Overall Cognitive Status: Within Functional Limits for tasks assessed                                          Exercises      Shoulder Instructions       General Comments      Pertinent Vitals/ Pain       Pain Assessment Pain Assessment: Faces Faces Pain Scale: Hurts little more Pain Location: back and LUE shoulder Pain Descriptors / Indicators: Sore, Aching, Grimacing Pain Intervention(s): Limited activity within patient's tolerance, Monitored during session, Repositioned, RN gave pain meds during session  Home Living                                          Prior Functioning/Environment              Frequency  Min 2X/week        Progress Toward Goals  OT Goals(current goals can now be found in the care plan section)  Progress towards OT goals: Progressing toward goals  Acute Rehab OT Goals Patient Stated Goal: go home OT Goal Formulation: With patient Time For Goal Achievement: 03/04/22 Potential to Achieve Goals: Good ADL Goals Pt Will Perform Grooming: with supervision;standing Pt Will Perform Upper Body Dressing: with min assist;sitting Pt Will Perform Lower Body Dressing: with supervision;sit to/from stand Pt Will Transfer to Toilet: with supervision;ambulating Pt Will Perform Toileting - Clothing Manipulation and hygiene: with  supervision;sit to/from stand  Plan Discharge plan remains appropriate    Co-evaluation                 AM-PAC OT "6 Clicks" Daily Activity     Outcome Measure   Help from another person eating meals?: None Help from another person taking care of personal grooming?: A Little Help from another person toileting, which includes using toliet, bedpan, or urinal?: A Little Help from another person bathing (including washing, rinsing, drying)?: A Lot Help from another person to put on and taking off regular upper body clothing?: A Lot Help from another person to put on and taking off regular lower body clothing?: A Lot 6 Click Score: 16    End of Session Equipment Utilized During Treatment: Other (comment) (sling/TLSO)  OT Visit Diagnosis: Unsteadiness on feet (R26.81);Muscle weakness (generalized) (M62.81);History of falling (Z91.81);Pain Pain - Right/Left: Left Pain - part of body: Shoulder   Activity Tolerance Patient tolerated treatment well   Patient Left in chair;with call bell/phone within reach   Nurse Communication Mobility status        Time:  4142-3953 OT Time Calculation (min): 29 min  Charges: OT General Charges $OT Visit: 1 Visit OT Treatments $Self Care/Home Management : 8-22 mins $Therapeutic Activity: 8-22 mins  Lodema Hong, OTA Acute Rehabilitation Services  Office [phone]   Trixie Dredge 02/21/2022, 12:48 PM

## 2022-02-21 NOTE — Progress Notes (Signed)
PROGRESS NOTE                                                                                                                                                                                                             Patient Demographics:    [name], is a 51 y.o. male, DOB - Oct 12, 1970, GGE:[id number]  Outpatient Primary MD for the patient is [name], MD    LOS - 3  Admit date - 02/17/2022    Chief Complaint  Patient presents with   Fall       Brief Narrative (HPI from H&P)   51 y.o. male with medical history significant of hypertension, hyperlipidemia, GERD, tobacco abuse, and recent diagnosis of multiple myeloma followed by Dr. Lorenso Courier of oncology presents complaining of lower back and left arm pain.  He had gotten up to use the restroom early this morning after he had watched the football games.  He had some pain in his lower back upon sitting up and switching positions to stand up, but pain was sharp and severe causing him to fall.  He tried to brace himself with his left arm, but developed severe pain in the left shoulder.  Patient fell onto the bed and then into the floor.  The ER his work-up was suggestive of left humerus fracture which was acute, some extension of his old L3 compression fracture and new L1 and L2 compression fractures.  He had excruciating lower back pain pain and was admitted for further care.   Subjective:   Patient in bed, appears comfortable, denies any headache, no fever, no chest pain or pressure, no shortness of breath , no abdominal pain. No focal weakness.  Back pain improved, left arm pain improved as well.   Assessment  & Plan :    Pathological proximal left humeral fracture, lumbar L1-L2 and extension of recent kyphoplasty L3 fracture in a patient with multiple myeloma.   He lives alone his left arm is in a sling due to fracture and is currently having excruciating low back  pain and unable to get out of the bed, case discussed with neurosurgeon Dr. Elwin Sleight, he was seen by IR underwent kyphoplasty on 02/19/2022 with improvement in back pain.  For now TLSO brace, pain control and PT OT.  Weightbearing as tolerated, advance activity since he lives alone  wants to spend another day in the hospital before he is comfortable going home with home PT OT.  If stable will try discharge him on 02/21/2022.  Orthopedics following for left proximal humerus fracture taking him to the OR for surgical correction on 02/21/2022, continue supportive care.    Multiple myeloma with normocytic anemia.  Case discussed with his oncologist Dr. Lorenso Courier he will follow-up postdischarge.  Dehydration and hyponatremia.  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  Fluid restriction gentle Lasix and monitor.  GERD.  On PPI.  Obesity.  BMI 36.  Follow with PCP for weight loss.      Condition - Fair  Family Communication  :  None  Code Status :  Full  Consults  : Orthopedics, neurosurgery Dr. Pieter Partridge Dawley, oncologist Dr. Lorenso Courier, IR  PUD Prophylaxis : PPI   Procedures  :     CT - L spine showing compression fractures at L1 - L2 and L3.      Disposition Plan  :    Status is: Observation  DVT Prophylaxis  :    Place and maintain sequential compression device Start: 02/19/22 0839 enoxaparin (LOVENOX) injection 40 mg Start: 02/17/22 1600   Lab Results  Component Value Date   PLT 204 02/21/2022    Diet :  Diet Order             Diet NPO time specified Except for: Sips with Meds  Diet effective midnight                    Inpatient Medications  Scheduled Meds:  docusate sodium  200 mg Oral BID   enoxaparin (LOVENOX) injection  40 mg Subcutaneous Q24H   lisinopril  40 mg Oral Daily   morphine  60 mg Oral Q12H   multivitamin with minerals  1 tablet Oral Daily   pantoprazole  40 mg Oral Daily   polyethylene glycol  17 g Oral Daily   Continuous  Infusions:   PRN Meds:.acetaminophen, albuterol, bisacodyl, hydrALAZINE, naLOXone (NARCAN)  injection, ondansetron **OR** ondansetron (ZOFRAN) IV, oxyCODONE  Antibiotics  :    Anti-infectives (From admission, onward)    Start     Dose/Rate Route Frequency Ordered Stop   02/19/22 0800  ceFAZolin (ANCEF) IVPB 2g/100 mL premix        2 g 200 mL/hr over 30 Minutes Intravenous  Once 02/18/22 1635 02/19/22 1417        Time Spent in minutes  30   Lala Lund M.D on 02/21/2022 at 9:13 AM  To page go to [email or web address]   [facility] -  Office  [phone]  See all Orders from today for further details    Objective:   Vitals:   02/20/22 1300 02/20/22 1911 02/21/22 0342 02/21/22 0715  BP: 119/79 126/78 118/82 112/76  Pulse: (!) 108 (!) 102 93 92  Resp: _0 Temp: 98.7 F (37.1 C) 100.1 F (37.8 C) 98.8 F (37.1 C) 98.1 F (36.7 C)  TempSrc: Oral Oral Oral Oral  SpO2: 96% 95% 99% 96%    Wt Readings from Last 3 Encounters:  02/13/22 111.9 kg  02/10/22 110.6 kg  02/05/22 110.7 kg     Intake/Output Summary (Last 24 hours) at 02/21/2022 0913 Last data filed at 02/20/2022 2300 Gross per 24 hour  Intake 260 ml  Output 900 ml  Net -640 ml     Physical Exam  Awake Alert, No new F.N deficits,  Normal affect [place].AT,PERRAL Supple Neck, No JVD,   Symmetrical Chest wall movement, Good air movement bilaterally, CTAB RRR,No Gallops, Rubs or new Murmurs,  +ve B.Sounds, Abd Soft, No tenderness,    Left arm in sling, low back pain is better   Data Review:    CBC Recent Labs  Lab 02/17/22 0520 02/17/22 1546 02/18/22 0441 02/19/22 0745 02/20/22 0453 02/21/22 0643  WBC 7.7  --  7.9 7.6 6.3 6.0  HGB 11.3* 11.6* 11.6* 10.9* 11.0* 10.9*  HCT 34.7* 35.9* 35.7* 32.5* 33.5* 32.8*  PLT 213  --  229 213 215 204  MCV 89.4  --  89.3 88.6 89.8 88.6  MCH 29.1  --  29.0 29.7 29.5 29.5  MCHC 32.6  --  32.5 33.5 32.8 33.2  RDW 13.0  --  13.0 13.0 13.0 12.8   LYMPHSABS 2.3  --   --  2.8 1.7  --   MONOABS 0.5  --   --  0.5 0.4  --   EOSABS 0.0  --   --  0.1 0.1  --   BASOSABS 0.0  --   --  0.0 0.0  --     Electrolytes Recent Labs  Lab 02/17/22 0520 02/18/22 0441 02/19/22 0745 02/20/22 0453 02/20/22 0623 02/21/22 0542 02/21/22 0643  NA 133* 132* 128* 133*  --  132*  --   K 4.0 4.0 4.1 4.2  --  4.2  --   CL 103 104 98 95*  --  100  --   CO2 21* _0 --  25  --   GLUCOSE 116* 103* 93 99  --  100*  --   BUN _1 --  17  --   CREATININE 1.17 0.93 1.00 1.08  --  1.03  --   CALCIUM 9.6 9.5 8.8* 10.5*  --  9.4  --   AST 30  --  22 28  --   --   --   ALT 33  --  30 29  --   --   --   ALKPHOS 47  --  46 54  --   --   --   BILITOT 0.5  --  0.8 0.7  --   --   --   ALBUMIN 2.9*  --  2.6* 2.7*  --   --   --   MG  --   --  1.8 2.0  --   --  2.2  PROCALCITON  --   --   --   --  0.18  --   --   INR 1.0  --   --   --   --   --   --   BNP  --   --  25.8 22.9  --   --   --     ------------------------------------------------------------------------------------------------------------------ No results for input(s): "CHOL", "HDL", "LDLCALC", "TRIG", "CHOLHDL", "LDLDIRECT" in the last 72 hours.  Lab Results  Component Value Date   HGBA1C 5.9 (H) 12/05/2021     Radiology Reports DG Chest Port 1 View  Result Date: 02/20/2022 CLINICAL DATA:  Shortness of breath.  Fracture of left shoulder. EXAM: PORTABLE CHEST 1 VIEW COMPARISON:  02/17/2022 FINDINGS: Heart size and mediastinal contours are stable. Lungs are hypoinflated. Pulmonary vascular congestion. No pleural effusion or edema. No airspace disease. Pathologic fracture involving the proximal left humerus is noted. IMPRESSION: 1. Pulmonary vascular congestion. 2. Pathologic fracture involving the proximal left humerus. Electronically  Signed   By: Kerby Moors M.D.   On: 02/20/2022 08:07   DG Shoulder Left  Result Date: 02/17/2022 CLINICAL DATA:  Fall EXAM: LEFT SHOULDER - 3 VIEW  COMPARISON:  Shoulder radiograph dated February 04, 2022 FINDINGS: Oblique displaced fracture of the proximal left humeral shaft. Associated lucent lesion of the proximal left humerus is again seen. Associated soft tissue edema of the proximal left arm. Visualized lung is clear. IMPRESSION: Oblique displaced fracture of the proximal left humeral shaft with associated lucent lesion, findings are concerning for pathologic compression fracture. Electronically Signed   By: Yetta Glassman M.D.   On: 02/17/2022 13:13   DG Pelvis Portable  Result Date: 02/17/2022 CLINICAL DATA:  Fall. EXAM: PORTABLE PELVIS 1-2 VIEWS COMPARISON:  CT abdomen and pelvis 07/21/2021 FINDINGS: No acute fracture or pelvic diastasis is identified. There is mild left hip joint space narrowing. IMPRESSION: No acute osseous abnormality identified. Electronically Signed   By: Logan Bores M.D.   On: 02/17/2022 13:09   DG Chest Portable 1 View  Result Date: 02/17/2022 CLINICAL DATA:  Fall. EXAM: PORTABLE CHEST 1 VIEW COMPARISON:  Chest radiographs 05/04/2019 FINDINGS: The cardiomediastinal silhouette is unchanged with normal heart size. Lung volumes are low with bronchovascular crowding. No confluent airspace opacity, overt pulmonary edema, sizable pleural effusion, or pneumothorax is identified. No acute osseous abnormality is seen. IMPRESSION: No active disease. Electronically Signed   By: Logan Bores M.D.   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 Low back pain. Status post recent L3 vertebral body biopsy, RF ablation, augmentation on 02/05/2022. Pathology revealed plasmacytoma. EXAM: CT LUMBAR SPINE WITHOUT CONTRAST TECHNIQUE: Multidetector CT imaging of the lumbar spine was performed without intravenous contrast administration. Multiplanar CT image reconstructions were also generated. RADIATION DOSE REDUCTION: This exam  was performed according to the departmental dose-optimization program which includes automated exposure control, adjustment of the mA and/or kV according to patient size and/or use of iterative reconstruction technique. COMPARISON:  Lumbar MRI 01/07/2022. CT Abdomen and Pelvis 07/21/2021. FINDINGS: Segmentation: Normal. Alignment: Stable lumbar lordosis. Vertebrae: Pronounced heterogeneity of bone mineralization since February this year. Diffuse lytic vertebral and visible pelvic bone lesions. Mild pathologic compression fracture of L1. More moderate pathologic compression fracture of L2, with lucency of the anterior superior endplate and 09% central loss of vertebral body height compared to February. No retropulsion of bone. L2 pedicles and posterior elements appear intact. Augmented moderate to severe L3 pathologic compression fracture. No significant L4 vertebral compression. Subtle L5 pathologic vertebral compression. Visible sacral ala appear intact despite myelomas involvement. Paraspinal and other soft tissues: Negative visible noncontrast abdominal viscera. Lumbar paraspinal soft tissues remain within normal limits. Disc levels: Lumbar spine degeneration predominantly moderate to severe facet arthropathy at L4-L5 where there is subtle chronic grade 1 anterolisthesis. IMPRESSION: 1. Diffuse Multiple Myeloma involvement of the visible spine and pelvis, new since a February CT. 2. Augmented compression fracture of L3. Mild-to-moderate pathologic compression fracture of L2 (22% loss of vertebral body height, no complicating features). Mild pathologic compression fracture of L1. Electronically Signed   By: Genevie Ann M.D.   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 Low back pain. Status post recent L3 vertebral body biopsy, RF ablation, augmentation on 02/05/2022. Pathology revealed plasmacytoma.  Neck pain. EXAM: CT CERVICAL SPINE WITHOUT CONTRAST TECHNIQUE:  Multidetector CT imaging of the cervical spine was performed without intravenous contrast. Multiplanar CT image reconstructions were also generated. RADIATION DOSE REDUCTION: This exam was performed according to the departmental dose-optimization program which includes automated exposure control, adjustment of the mA and/or kV according to patient size and/or use of iterative reconstruction technique. COMPARISON:  Cervical spine radiographs 12/18/2021. FINDINGS: Alignment: Straightening of cervical lordosis. Cervicothoracic junction alignment is within normal limits. Bilateral posterior element alignment is within normal limits. Skull base and vertebrae: Visualized skull base is intact. No atlanto-occipital dissociation. C1 and C2 appear intact and aligned. Widely scattered ill-defined lucent lesions are present elsewhere in the cervical spine and visible upper thoracic spine C3 through T2. No cervical pathologic fracture is identified. Posterior element involvement is noted on the right at C4 (series 4, image 44). No expansile cervical lesion at this time. Soft tissues and spinal canal: No prevertebral fluid or swelling. No visible canal hematoma. Negative noncontrast visible neck soft tissues. Disc levels: Widespread superimposed cervical spine disc and endplate degeneration. Multilevel degenerative cervical spinal stenosis, mild to moderate at C5-C6. Upper chest: T1 and T2 vertebral myeloma thus involvement, including an indistinct 10 mm lucent lesion of the left T1 body. Lung apices are clear. Negative visible noncontrast superior mediastinum. Other: Negative visible noncontrast posterior fossa. Visible paranasal sinuses, tympanic cavities and mastoids are well aerated. IMPRESSION: 1. Widespread cervical and visible upper thoracic vertebral involvement by Multiple Myeloma. No cervical pathologic fracture. 2. Superimposed bulky cervical spine  degeneration. Subsequent mild to moderate degenerative spinal stenosis suspected at C5-C6. Electronically Signed   By: Genevie Ann M.D.   On: 02/17/2022 09:41

## 2022-02-21 NOTE — Progress Notes (Signed)
Mobility Specialist Progress Note:   02/21/22 1422  Mobility  Activity Ambulated independently in hallway  Level of Assistance Independent  Assistive Device None  LUE Weight Bearing NWB  Distance Ambulated (ft) 250 ft  Activity Response Tolerated well  $Mobility charge 1 Mobility   Pt received in bed and agreeable. C/o of feeling stiff and itchy. RN notified. Pt left in bed with all needs met and call bell in reach.   Kaitlyn Wall Mobility Specialist-Acute Rehab Secure Chat only  

## 2022-02-21 NOTE — Progress Notes (Signed)
Mobility Specialist Progress Note:   02/21/22 1128  Mobility  Activity Ambulated independently in room  Level of Assistance Modified independent, requires aide device or extra time  Assistive Device None  LUE Weight Bearing NWB  Distance Ambulated (ft) 20 ft  Activity Response Tolerated well  $Mobility charge 1 Mobility   Pt requesting assistance with transfer from chair to bed and pericare. Assisted with pericare on right side d/t NWB status. No complaints. Pt requesting to ambulate in hall later in day. Pt left in bed with all needs met and call bell in reach.   [name] Mobility Specialist-Acute Rehab Secure Chat only

## 2022-02-22 ENCOUNTER — Inpatient Hospital Stay (HOSPITAL_COMMUNITY): Payer: BC Managed Care – PPO | Admitting: Certified Registered Nurse Anesthetist

## 2022-02-22 ENCOUNTER — Encounter (HOSPITAL_COMMUNITY)
Admission: EM | Disposition: A | Discharge status: Home Health | Payer: Self-pay | Source: Home / Self Care | Attending: Internal Medicine

## 2022-02-22 ENCOUNTER — Other Ambulatory Visit: Payer: Self-pay

## 2022-02-22 ENCOUNTER — Inpatient Hospital Stay (HOSPITAL_COMMUNITY): Payer: BC Managed Care – PPO

## 2022-02-22 ENCOUNTER — Encounter (HOSPITAL_COMMUNITY): Payer: Self-pay | Admitting: Internal Medicine

## 2022-02-22 DIAGNOSIS — M84622A Pathological fracture in other disease, left humerus, initial encounter for fracture: Secondary | ICD-10-CM | POA: Diagnosis not present

## 2022-02-22 HISTORY — PX: HUMERUS IM NAIL: SHX1769

## 2022-02-22 LAB — BASIC METABOLIC PANEL
Anion gap: 7 (ref 5–15)
BUN: 18 mg/dL (ref 6–20)
CO2: 26 mmol/L (ref 22–32)
Calcium: 9.9 mg/dL (ref 8.9–10.3)
Chloride: 100 mmol/L (ref 98–111)
Creatinine, Ser: 1.07 mg/dL (ref 0.61–1.24)
GFR, Estimated: >60 mL/min (ref >60)
Glucose, Bld: 101 mg/dL — ABNORMAL HIGH (ref 70–99)
Potassium: 4.1 mmol/L (ref 3.5–5.1)
Sodium: 133 mmol/L — ABNORMAL LOW (ref 135–145)

## 2022-02-22 LAB — CBC WITH DIFFERENTIAL/PLATELET
Abs Immature Granulocytes: 0.07 10*3/uL (ref 0.00–0.07)
Basophils Absolute: 0 10*3/uL (ref 0.0–0.1)
Basophils Relative: 0 %
Eosinophils Absolute: 0.1 10*3/uL (ref 0.0–0.5)
Eosinophils Relative: 3 %
HCT: 32.4 % — ABNORMAL LOW (ref 39.0–52.0)
Hemoglobin: 10.8 g/dL — ABNORMAL LOW (ref 13.0–17.0)
Immature Granulocytes: 1 %
Lymphocytes Relative: 46 %
Lymphs Abs: 2.6 10*3/uL (ref 0.7–4.0)
MCH: 29.9 pg (ref 26.0–34.0)
MCHC: 33.3 g/dL (ref 30.0–36.0)
MCV: 89.8 fL (ref 80.0–100.0)
Monocytes Absolute: 0.5 10*3/uL (ref 0.1–1.0)
Monocytes Relative: 8 %
Neutro Abs: 2.4 10*3/uL (ref 1.7–7.7)
Neutrophils Relative %: 42 %
Platelets: 203 10*3/uL (ref 150–400)
RBC: 3.61 MIL/uL — ABNORMAL LOW (ref 4.22–5.81)
RDW: 12.6 % (ref 11.5–15.5)
WBC: 5.6 10*3/uL (ref 4.0–10.5)
nRBC: 0 % (ref 0.0–0.2)

## 2022-02-22 LAB — MAGNESIUM: Magnesium: 2.1 mg/dL (ref 1.7–2.4)

## 2022-02-22 SURGERY — INSERTION, INTRAMEDULLARY ROD, HUMERUS
Anesthesia: General | Laterality: Left

## 2022-02-22 MED ORDER — PHENYLEPHRINE 80 MCG/ML (10ML) SYRINGE FOR IV PUSH (FOR BLOOD PRESSURE SUPPORT)
PREFILLED_SYRINGE | INTRAVENOUS | Status: DC | PRN
  Administered 2022-02-22: 160 ug via INTRAVENOUS
  Administered 2022-02-22 (×3): 240 ug via INTRAVENOUS

## 2022-02-22 MED ORDER — OXYCODONE HCL 5 MG PO TABS: 5.0000 mg | ORAL_TABLET | Freq: Once | ORAL | Status: DC | PRN

## 2022-02-22 MED ORDER — CHLORHEXIDINE GLUCONATE 0.12 % MT SOLN
15.0000 mL | Freq: Once | OROMUCOSAL | Status: DC
  Filled 2022-02-22: qty 15

## 2022-02-22 MED ORDER — FENTANYL CITRATE (PF) 100 MCG/2ML IJ SOLN
INTRAMUSCULAR | Status: DC | PRN
  Administered 2022-02-22: 50 ug via INTRAVENOUS

## 2022-02-22 MED ORDER — LIDOCAINE 2% (20 MG/ML) 5 ML SYRINGE
INTRAMUSCULAR | Status: DC | PRN
  Administered 2022-02-22: 60 mg via INTRAVENOUS

## 2022-02-22 MED ORDER — ROCURONIUM BROMIDE 10 MG/ML (PF) SYRINGE
PREFILLED_SYRINGE | INTRAVENOUS | Status: AC
  Filled 2022-02-22: qty 10

## 2022-02-22 MED ORDER — DEXAMETHASONE SODIUM PHOSPHATE 10 MG/ML IJ SOLN
INTRAMUSCULAR | Status: DC | PRN
  Administered 2022-02-22: 5 mg via INTRAVENOUS

## 2022-02-22 MED ORDER — EPHEDRINE SULFATE-NACL 50-0.9 MG/10ML-% IV SOSY
PREFILLED_SYRINGE | INTRAVENOUS | Status: DC | PRN
  Administered 2022-02-22: 10 mg via INTRAVENOUS

## 2022-02-22 MED ORDER — LIDOCAINE 2% (20 MG/ML) 5 ML SYRINGE
INTRAMUSCULAR | Status: AC
  Filled 2022-02-22: qty 5

## 2022-02-22 MED ORDER — LACTATED RINGERS IV SOLN: INTRAVENOUS | Status: DC

## 2022-02-22 MED ORDER — PROPOFOL 10 MG/ML IV BOLUS
INTRAVENOUS | Status: DC | PRN
  Administered 2022-02-22: 200 mg via INTRAVENOUS

## 2022-02-22 MED ORDER — BUPIVACAINE LIPOSOME 1.3 % IJ SUSP
INTRAMUSCULAR | Status: DC | PRN
  Administered 2022-02-22: 10 mL via PERINEURAL

## 2022-02-22 MED ORDER — OXYCODONE HCL 5 MG/5ML PO SOLN: 5.0000 mg | Freq: Once | ORAL | Status: DC | PRN

## 2022-02-22 MED ORDER — ROCURONIUM BROMIDE 10 MG/ML (PF) SYRINGE
PREFILLED_SYRINGE | INTRAVENOUS | Status: DC | PRN
  Administered 2022-02-22: 60 mg via INTRAVENOUS

## 2022-02-22 MED ORDER — MIDAZOLAM HCL 2 MG/2ML IJ SOLN: 2.0000 mg | Freq: Once | INTRAMUSCULAR | Status: AC

## 2022-02-22 MED ORDER — ONDANSETRON HCL 4 MG/2ML IJ SOLN
INTRAMUSCULAR | Status: DC | PRN
  Administered 2022-02-22: 4 mg via INTRAVENOUS

## 2022-02-22 MED ORDER — SUGAMMADEX SODIUM 200 MG/2ML IV SOLN
INTRAVENOUS | Status: DC | PRN
  Administered 2022-02-22: 200 mg via INTRAVENOUS

## 2022-02-22 MED ORDER — FENTANYL CITRATE (PF) 100 MCG/2ML IJ SOLN: 25.0000 ug | INTRAMUSCULAR | Status: DC | PRN

## 2022-02-22 MED ORDER — BUPIVACAINE HCL (PF) 0.5 % IJ SOLN
INTRAMUSCULAR | Status: DC | PRN
  Administered 2022-02-22: 15 mL via PERINEURAL

## 2022-02-22 MED ORDER — ONDANSETRON HCL 4 MG/2ML IJ SOLN: 4.0000 mg | Freq: Four times a day (QID) | INTRAMUSCULAR | Status: DC | PRN

## 2022-02-22 MED ORDER — FENTANYL CITRATE (PF) 100 MCG/2ML IJ SOLN
INTRAMUSCULAR | Status: AC
  Administered 2022-02-22: 100 ug via INTRAVENOUS
  Filled 2022-02-22: qty 2

## 2022-02-22 MED ORDER — MIDAZOLAM HCL 2 MG/2ML IJ SOLN
INTRAMUSCULAR | Status: AC
  Administered 2022-02-22: 2 mg via INTRAVENOUS
  Filled 2022-02-22: qty 2

## 2022-02-22 MED ORDER — ONDANSETRON HCL 4 MG/2ML IJ SOLN
INTRAMUSCULAR | Status: AC
  Filled 2022-02-22: qty 2

## 2022-02-22 MED ORDER — PROPOFOL 10 MG/ML IV BOLUS
INTRAVENOUS | Status: AC
  Filled 2022-02-22: qty 20

## 2022-02-22 MED ORDER — CHLORHEXIDINE GLUCONATE 0.12 % MT SOLN
OROMUCOSAL | Status: AC
  Filled 2022-02-22: qty 15

## 2022-02-22 MED ORDER — PHENYLEPHRINE 80 MCG/ML (10ML) SYRINGE FOR IV PUSH (FOR BLOOD PRESSURE SUPPORT)
PREFILLED_SYRINGE | INTRAVENOUS | Status: AC
  Filled 2022-02-22: qty 10

## 2022-02-22 MED ORDER — FENTANYL CITRATE (PF) 250 MCG/5ML IJ SOLN
INTRAMUSCULAR | Status: AC
  Filled 2022-02-22: qty 5

## 2022-02-22 MED ORDER — MIDAZOLAM HCL 2 MG/2ML IJ SOLN
INTRAMUSCULAR | Status: AC
  Filled 2022-02-22: qty 2

## 2022-02-22 MED ORDER — DEXAMETHASONE SODIUM PHOSPHATE 10 MG/ML IJ SOLN
INTRAMUSCULAR | Status: AC
  Filled 2022-02-22: qty 1

## 2022-02-22 MED ORDER — POVIDONE-IODINE 10 % EX SWAB
2.0000 | Freq: Once | CUTANEOUS | Status: AC
  Administered 2022-02-22: 2 via TOPICAL

## 2022-02-22 MED ORDER — 0.9 % SODIUM CHLORIDE (POUR BTL) OPTIME
TOPICAL | Status: DC | PRN
  Administered 2022-02-22: 500 mL

## 2022-02-22 MED ORDER — CHLORHEXIDINE GLUCONATE 4 % EX LIQD: 60.0000 mL | Freq: Once | CUTANEOUS | Status: DC

## 2022-02-22 MED ORDER — MIDAZOLAM HCL 2 MG/2ML IJ SOLN
INTRAMUSCULAR | Status: DC | PRN
  Administered 2022-02-22: 2 mg via INTRAVENOUS

## 2022-02-22 MED ORDER — FENTANYL CITRATE (PF) 100 MCG/2ML IJ SOLN: 100.0000 ug | Freq: Once | INTRAMUSCULAR | Status: AC

## 2022-02-22 MED ORDER — CEFAZOLIN IN SODIUM CHLORIDE 3-0.9 GM/100ML-% IV SOLN
INTRAVENOUS | Status: AC
  Filled 2022-02-22: qty 100

## 2022-02-22 MED ORDER — CEFAZOLIN IN SODIUM CHLORIDE 3-0.9 GM/100ML-% IV SOLN
3.0000 g | INTRAVENOUS | Status: AC
  Administered 2022-02-22: 3 g via INTRAVENOUS

## 2022-02-22 MED ORDER — PHENYLEPHRINE HCL-NACL 20-0.9 MG/250ML-% IV SOLN
INTRAVENOUS | Status: DC | PRN
  Administered 2022-02-22: 25 ug/min via INTRAVENOUS

## 2022-02-22 SURGICAL SUPPLY — 60 items
ALCOHOL 70% 16 OZ (MISCELLANEOUS) IMPLANT
APL PRP STRL LF DISP 70% ISPRP (MISCELLANEOUS) ×1
BAG COUNTER SPONGE SURGICOUNT (BAG) IMPLANT
BAG SPNG CNTER NS LX DISP (BAG) ×1
BIT DRILL 10 HOLLOW (BIT) IMPLANT
BIT DRILL 3.8X270 (BIT) IMPLANT
BIT DRILL SHORT 3.2MM (DRILL) IMPLANT
CHLORAPREP W/TINT 26 (MISCELLANEOUS) ×1 IMPLANT
COVER SURGICAL LIGHT HANDLE (MISCELLANEOUS) ×1 IMPLANT
DRAPE INCISE IOBAN 66X45 STRL (DRAPES) ×1 IMPLANT
DRAPE ORTHO SPLIT 77X108 STRL (DRAPES) ×2
DRAPE SURG ORHT 6 SPLT 77X108 (DRAPES) ×2 IMPLANT
DRAPE U-SHAPE 47X51 STRL (DRAPES) ×1 IMPLANT
DRILL SHORT 3.2MM (DRILL) ×1
DRSG ADAPTIC 3X8 NADH LF (GAUZE/BANDAGES/DRESSINGS) ×1 IMPLANT
ELECT REM PT RETURN 9FT ADLT (ELECTROSURGICAL) ×1
ELECTRODE REM PT RTRN 9FT ADLT (ELECTROSURGICAL) ×1 IMPLANT
GAUZE SPONGE 4X4 12PLY STRL (GAUZE/BANDAGES/DRESSINGS) IMPLANT
GLOVE BIO SURGEON STRL SZ7.5 (GLOVE) ×2 IMPLANT
GLOVE BIOGEL PI IND STRL 8 (GLOVE) ×2 IMPLANT
GOWN STRL REUS W/ TWL LRG LVL3 (GOWN DISPOSABLE) ×1 IMPLANT
GOWN STRL REUS W/ TWL XL LVL3 (GOWN DISPOSABLE) ×2 IMPLANT
GOWN STRL REUS W/TWL LRG LVL3 (GOWN DISPOSABLE) ×1
GOWN STRL REUS W/TWL XL LVL3 (GOWN DISPOSABLE) ×2
GUIDEROD SS 2.5MMX280MM (ROD) IMPLANT
K-WIRE TROCAR POINT 2.5X280 (WIRE) ×1
KIT BASIN OR (CUSTOM PROCEDURE TRAY) ×1 IMPLANT
KIT TURNOVER KIT B (KITS) ×1 IMPLANT
KWIRE TROCAR POINT 2.5X280 (WIRE) IMPLANT
MANIFOLD NEPTUNE II (INSTRUMENTS) ×1 IMPLANT
NAIL CANN HUM ML 7X225 LT (Nail) IMPLANT
NS IRRIG 1000ML POUR BTL (IV SOLUTION) ×1 IMPLANT
PACK SHOULDER (CUSTOM PROCEDURE TRAY) ×1 IMPLANT
PAD ARMBOARD 7.5X6 YLW CONV (MISCELLANEOUS) ×2 IMPLANT
ROD REAMING 2.5 (INSTRUMENTS) IMPLANT
SCREW LOCK 4.5X38 TI FT (Screw) IMPLANT
SCREW LOCK 4.5X40 (Screw) ×1 IMPLANT
SCREW LOCK FT 40X4.5X3.9XSLF (Screw) IMPLANT
SCREW LOCK THRD TI ML 4.5X34 (Screw) IMPLANT
SCREW LOCKING 4.0 28MM (Screw) IMPLANT
SLING ARM FOAM STRAP LRG (SOFTGOODS) IMPLANT
SLING ARM FOAM STRAP MED (SOFTGOODS) IMPLANT
SLING ARM IMMOBILIZER LRG (SOFTGOODS) IMPLANT
SLING ARM IMMOBILIZER XL (CAST SUPPLIES) IMPLANT
SPONGE T-LAP 4X18 ~~LOC~~+RFID (SPONGE) IMPLANT
STRIP CLOSURE SKIN 1/2X4 (GAUZE/BANDAGES/DRESSINGS) IMPLANT
SUCTION FRAZIER HANDLE 10FR (MISCELLANEOUS) ×1
SUCTION TUBE FRAZIER 10FR DISP (MISCELLANEOUS) ×1 IMPLANT
SUT FIBERWIRE #2 38 T-5 BLUE (SUTURE)
SUT MNCRL AB 4-0 PS2 18 (SUTURE) IMPLANT
SUT VIC AB 2-0 CT1 (SUTURE) IMPLANT
SUT VIC AB 2-0 CT1 27 (SUTURE) ×1
SUT VIC AB 2-0 CT1 TAPERPNT 27 (SUTURE) ×1 IMPLANT
SUT VICRYL 0 UR6 27IN ABS (SUTURE) IMPLANT
SUTURE FIBERWR #2 38 T-5 BLUE (SUTURE) IMPLANT
SYR CONTROL 10ML LL (SYRINGE) IMPLANT
TOWEL GREEN STERILE (TOWEL DISPOSABLE) ×1 IMPLANT
TOWEL GREEN STERILE FF (TOWEL DISPOSABLE) ×1 IMPLANT
WATER STERILE IRR 1000ML POUR (IV SOLUTION) ×1 IMPLANT
YANKAUER SUCT BULB TIP NO VENT (SUCTIONS) ×1 IMPLANT

## 2022-02-22 NOTE — Transfer of Care (Signed)
Immediate Anesthesia Transfer of Care Note  Patient: [name]  Procedure(s) Performed: INTRAMEDULLARY (IM) NAIL HUMERAL (Left)  Patient Location: PACU  Anesthesia Type:General and Regional  Level of Consciousness: awake and oriented  Airway & Oxygen Therapy: Patient Spontanous Breathing  Post-op Assessment: Report given to RN  Post vital signs: Reviewed and stable  Last Vitals:  Vitals Value Taken Time  BP 115/78 02/22/22 1325  Temp 37.4 C 02/22/22 1324  Pulse 108 02/22/22 1326  Resp 19 02/22/22 1326  SpO2 91 % 02/22/22 1326  Vitals shown include unvalidated device data.  Last Pain:  Vitals:   02/22/22 1036  TempSrc: Oral  PainSc:       Patients Stated Pain Goal: 0 (58/25/18 9842)  Complications:  Encounter Notable Events  Notable Event Outcome Phase Comment  Difficult to intubate - expected  Intraprocedure Filed from anesthesia note documentation.

## 2022-02-22 NOTE — Anesthesia Procedure Notes (Signed)
Anesthesia Regional Block: Interscalene brachial plexus block   Pre-Anesthetic Checklist: , timeout performed,  Correct Patient, Correct Site, Correct Laterality,  Correct Procedure, Correct Position, site marked,  Risks and benefits discussed,  Surgical consent,  Pre-op evaluation,  At surgeon's request and post-op pain management  Laterality: Left  Prep: chloraprep       Needles:  Injection technique: Single-shot  Needle Type: Echogenic Stimulator Needle     Needle Length: 5cm  Needle Gauge: 22     Additional Needles:   Procedures:, nerve stimulator,,,,,     Nerve Stimulator or Paresthesia:  Response: biceps flexion, 0.45 mA  Additional Responses:   Narrative:  Start time: 02/22/2022 11:07 AM End time: 02/22/2022 11:17 AM Injection made incrementally with aspirations every 5 mL.  Performed by: Personally  Anesthesiologist: Albertha Ghee, MD  Additional Notes: Functioning IV was confirmed and monitors were applied.  A 79m 22ga Arrow echogenic stimulator needle was used. Sterile prep and drape,hand hygiene and sterile gloves were used.  Negative aspiration and negative test dose prior to incremental administration of local anesthetic. The patient tolerated the procedure well.  Ultrasound guidance: relevent anatomy identified, needle position confirmed, local anesthetic spread visualized around nerve(s), vascular puncture avoided.  Image printed for medical record.

## 2022-02-22 NOTE — Progress Notes (Signed)
PROGRESS NOTE                                                                                                                                                                                                             Patient Demographics:    [name], is a 51 y.o. male, DOB - 1971/01/01, ZOX:[id number]  Outpatient Primary MD for the patient is [name], MD    LOS - 4  Admit date - 02/17/2022    Chief Complaint  Patient presents with   Fall       Brief Narrative (HPI from H&P)   51 y.o. male with medical history significant of hypertension, hyperlipidemia, GERD, tobacco abuse, and recent diagnosis of multiple myeloma followed by Dr. Lorenso Courier of oncology presents complaining of lower back and left arm pain.  He had gotten up to use the restroom early this morning after he had watched the football games.  He had some pain in his lower back upon sitting up and switching positions to stand up, but pain was sharp and severe causing him to fall.  He tried to brace himself with his left arm, but developed severe pain in the left shoulder.  Patient fell onto the bed and then into the floor.  The ER his work-up was suggestive of left humerus fracture which was acute, some extension of his old L3 compression fracture and new L1 and L2 compression fractures.  He had excruciating lower back pain pain and was admitted for further care.   Subjective:   Patient in bed, appears comfortable, denies any headache, no fever, no chest pain or pressure, no shortness of breath , no abdominal pain. No new focal weakness. Back pain improved, left arm pain improved as well.   Assessment  & Plan :    Pathological proximal left humeral fracture, lumbar L1-L2 and extension of recent kyphoplasty L3 fracture in a patient with multiple myeloma.   He lives alone his left arm is in a sling due to fracture and is currently having excruciating low back  pain and unable to get out of the bed, case discussed with neurosurgeon Dr. Elwin Sleight, he was seen by IR underwent kyphoplasty on 02/19/2022 with improvement in back pain.  For now TLSO brace, pain control and PT OT.  Weightbearing as tolerated, advance activity since he lives alone  wants to spend another day in the hospital before he is comfortable going home with home PT OT.  If stable will try discharge him on 02/23/2022 post L.Arm surgery on 02/22/22.  Orthopedics following for left proximal humerus fracture taking him to the OR for surgical correction on 02/22/2022, continue supportive care.    Multiple myeloma with normocytic anemia.  Case discussed with his oncologist Dr. Lorenso Courier he will follow-up postdischarge.  Dehydration and hyponatremia.  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  Fluid restriction gentle Lasix and monitor.  GERD.  On PPI.  Obesity.  BMI 36.  Follow with PCP for weight loss.      Condition - Fair  Family Communication  :  None  Code Status :  Full  Consults  : Orthopedics, neurosurgery Dr. Pieter Partridge Dawley, oncologist Dr. Lorenso Courier, IR  PUD Prophylaxis : PPI   Procedures  :     CT - L spine showing compression fractures at L1 - L2 and L3.      Disposition Plan  :    Status is: Observation  DVT Prophylaxis  :    Place and maintain sequential compression device Start: 02/19/22 0839 enoxaparin (LOVENOX) injection 40 mg Start: 02/17/22 1600   Lab Results  Component Value Date   PLT 203 02/22/2022    Diet :  Diet Order             Diet NPO time specified  Diet effective midnight                    Inpatient Medications  Scheduled Meds:  docusate sodium  200 mg Oral BID   enoxaparin (LOVENOX) injection  40 mg Subcutaneous Q24H   lisinopril  40 mg Oral Daily   morphine  60 mg Oral Q12H   multivitamin with minerals  1 tablet Oral Daily   pantoprazole  40 mg Oral Daily   polyethylene glycol  17 g Oral Daily   Continuous  Infusions:   PRN Meds:.acetaminophen, albuterol, bisacodyl, hydrALAZINE, naLOXone (NARCAN)  injection, ondansetron **OR** ondansetron (ZOFRAN) IV, oxyCODONE  Antibiotics  :    Anti-infectives (From admission, onward)    Start     Dose/Rate Route Frequency Ordered Stop   02/19/22 0800  ceFAZolin (ANCEF) IVPB 2g/100 mL premix        2 g 200 mL/hr over 30 Minutes Intravenous  Once 02/18/22 1635 02/19/22 1417        Time Spent in minutes  30   Lala Lund M.D on 02/22/2022 at 8:45 AM  To page go to [email or web address]   [facility] -  Office  [phone]  See all Orders from today for further details    Objective:   Vitals:   02/21/22 0715 02/21/22 2025 02/22/22 0425 02/22/22 0700  BP: 112/76 (!) 144/80 105/74 116/72  Pulse: 92 96 87 79  Resp: $Remo'18 18 18 18  'sMnXG$ Temp: 98.1 F (36.7 C) 99.6 F (37.6 C) 98.3 F (36.8 C) 98.1 F (36.7 C)  TempSrc: Oral Oral Oral Oral  SpO2: 96% 97% 99% 96%    Wt Readings from Last 3 Encounters:  02/13/22 111.9 kg  02/10/22 110.6 kg  02/05/22 110.7 kg     Intake/Output Summary (Last 24 hours) at 02/22/2022 0845 Last data filed at 02/21/2022 2100 Gross per 24 hour  Intake 204 ml  Output 900 ml  Net -696 ml     Physical Exam  Awake Alert, No new F.N deficits, Normal  affect [place].AT,PERRAL Supple Neck, No JVD,   Symmetrical Chest wall movement, Good air movement bilaterally, CTAB RRR,No Gallops, Rubs or new Murmurs,  +ve B.Sounds, Abd Soft, No tenderness,    Left arm in sling, low back pain is better   Data Review:    CBC Recent Labs  Lab 02/17/22 0520 02/17/22 1546 02/18/22 0441 02/19/22 0745 02/20/22 0453 02/21/22 0643 02/22/22 0640  WBC 7.7  --  7.9 7.6 6.3 6.0 5.6  HGB 11.3*   < > 11.6* 10.9* 11.0* 10.9* 10.8*  HCT 34.7*   < > 35.7* 32.5* 33.5* 32.8* 32.4*  PLT 213  --  229 213 215 204 203  MCV 89.4  --  89.3 88.6 89.8 88.6 89.8  MCH 29.1  --  29.0 29.7 29.5 29.5 29.9  MCHC 32.6  --  32.5 33.5 32.8 33.2  33.3  RDW 13.0  --  13.0 13.0 13.0 12.8 12.6  LYMPHSABS 2.3  --   --  2.8 1.7  --  2.6  MONOABS 0.5  --   --  0.5 0.4  --  0.5  EOSABS 0.0  --   --  0.1 0.1  --  0.1  BASOSABS 0.0  --   --  0.0 0.0  --  0.0   < > = values in this interval not displayed.    Electrolytes Recent Labs  Lab 02/17/22 0520 02/18/22 0441 02/19/22 0745 02/20/22 0453 02/20/22 0623 02/21/22 0542 02/21/22 0643 02/22/22 0640  NA 133* 132* 128* 133*  --  132*  --  133*  K 4.0 4.0 4.1 4.2  --  4.2  --  4.1  CL 103 104 98 95*  --  100  --  100  CO2 21* $Remov'25 24 26  'ZsuNPY$ --  25  --  26  GLUCOSE 116* 103* 93 99  --  100*  --  101*  BUN $Re'19 11 13 13  'DiE$ --  17  --  18  CREATININE 1.17 0.93 1.00 1.08  --  1.03  --  1.07  CALCIUM 9.6 9.5 8.8* 10.5*  --  9.4  --  9.9  AST 30  --  22 28  --   --   --   --   ALT 33  --  30 29  --   --   --   --   ALKPHOS 47  --  46 54  --   --   --   --   BILITOT 0.5  --  0.8 0.7  --   --   --   --   ALBUMIN 2.9*  --  2.6* 2.7*  --   --   --   --   MG  --   --  1.8 2.0  --   --  2.2 2.1  PROCALCITON  --   --   --   --  0.18  --   --   --   INR 1.0  --   --   --   --   --   --   --   BNP  --   --  25.8 22.9  --   --   --   --     ------------------------------------------------------------------------------------------------------------------ No results for input(s): "CHOL", "HDL", "LDLCALC", "TRIG", "CHOLHDL", "LDLDIRECT" in the last 72 hours.  Lab Results  Component Value Date   HGBA1C 5.9 (H) 12/05/2021     Radiology Reports DG Chest Port 1 View  Result Date:  02/20/2022 CLINICAL DATA:  Shortness of breath.  Fracture of left shoulder. EXAM: PORTABLE CHEST 1 VIEW COMPARISON:  02/17/2022 FINDINGS: Heart size and mediastinal contours are stable. Lungs are hypoinflated. Pulmonary vascular congestion. No pleural effusion or edema. No airspace disease. Pathologic fracture involving the proximal left humerus is noted. IMPRESSION: 1. Pulmonary vascular congestion. 2. Pathologic fracture  involving the proximal left humerus. Electronically Signed   By: Kerby Moors M.D.   On: 02/20/2022 08:07

## 2022-02-22 NOTE — Anesthesia Procedure Notes (Signed)
Procedure Name: Intubation Date/Time: 02/22/2022 11:37 AM  Performed by: Barrington Ellison, CRNAPre-anesthesia Checklist: Patient identified, Emergency Drugs available, Suction available and Patient being monitored Patient Re-evaluated:Patient Re-evaluated prior to induction Oxygen Delivery Method: Circle System Utilized Preoxygenation: Pre-oxygenation with 100% oxygen Induction Type: IV induction Ventilation: Mask ventilation without difficulty, Two handed mask ventilation required and Oral airway inserted - appropriate to patient size Laryngoscope Size: Glidescope and 4 Grade View: Grade I Tube type: Oral Tube size: 8.0 mm Number of attempts: 1 Airway Equipment and Method: Stylet and Oral airway Placement Confirmation: ETT inserted through vocal cords under direct vision, positive ETCO2 and breath sounds checked- equal and bilateral Secured at: 22 cm Tube secured with: Tape Dental Injury: Teeth and Oropharynx as per pre-operative assessment  Difficulty Due To: Difficulty was anticipated and Difficult Airway- due to large tongue Future Recommendations: Recommend- induction with short-acting agent, and alternative techniques readily available Comments: Large tongue, short neck and thick chest- glidescope recommended

## 2022-02-22 NOTE — Anesthesia Postprocedure Evaluation (Signed)
Anesthesia Post Note  Patient: [name]  Procedure(s) Performed: INTRAMEDULLARY (IM) NAIL HUMERAL (Left)     Patient location during evaluation: PACU Anesthesia Type: General and Regional Level of consciousness: awake and alert Pain management: pain level controlled Vital Signs Assessment: post-procedure vital signs reviewed and stable Respiratory status: spontaneous breathing, nonlabored ventilation, respiratory function stable and patient connected to nasal cannula oxygen Cardiovascular status: blood pressure returned to baseline and stable Postop Assessment: no apparent nausea or vomiting Anesthetic complications: yes   Encounter Notable Events  Notable Event Outcome Phase Comment  Difficult to intubate - expected  Intraprocedure Filed from anesthesia note documentation.    Last Vitals:  Vitals:   02/22/22 1345 02/22/22 1424  BP: 113/67 118/73  Pulse: 96 96  Resp: 16 16  Temp: 37.4 C 37 C  SpO2: 94% 98%    Last Pain:  Vitals:   02/22/22 1424  TempSrc: Oral  PainSc:                  [place]

## 2022-02-22 NOTE — H&P (Signed)
H&P update  The surgical history has been reviewed and remains accurate without interval change.  The patient was re-examined and patient's physiologic condition has not changed significantly in the last 30 days. The condition still exists that makes this procedure necessary. The treatment plan remains the same, without new options for care.  No new pharmacological allergies or types of therapy has been initiated that would change the plan or the appropriateness of the plan.  The patient and/or family understand the potential benefits and risks.  He had tentatively been set up for discharge home and outpatient management, however had significant pain that kept him in the hospital so of discussed moving ahead with intramedullary nailing of the left humerus as well as risk and benefits of the procedure in detail with the patient.  We will move ahead today with that.  Birttany Dechellis P. Stann Mainland, MD 02/22/2022 11:19 AM

## 2022-02-22 NOTE — Progress Notes (Signed)
Patient has arrived to the floor from [place]

## 2022-02-22 NOTE — Op Note (Addendum)
Date of Surgery: 02/22/2022  INDICATIONS: [name] is a 51 y.o.-year-old male with a left pathologic proximal humerus fracture;  The patient did consent to the procedure after discussion of the risks and benefits.  PREOPERATIVE DIAGNOSIS:  Left proximal humerus pathologic fracture   POSTOPERATIVE DIAGNOSIS: Same.  PROCEDURE: Medullary nailing left humeral shaft  SURGEON: Geralynn Rile, M.D.  ASSIST: Jonelle Sidle, PA-C  Assistant attestation:  PA Mcclung present for the entire procedure..  ANESTHESIA:  general  IV FLUIDS AND URINE: See anesthesia.  ESTIMATED BLOOD LOSS: 100 mL.  IMPLANTS: Synthes multi lock humeral nail 7 mm x 225 mm with 3 proximal locking screws as well as 1 distal interlock screw  DRAINS: None  COMPLICATIONS: None.  DESCRIPTION OF PROCEDURE: The patient was brought to the operating room and placed supine on the operating table.  The patient had been signed prior to the procedure and this was documented. The patient had the anesthesia placed by the anesthesiologist.  A time-out was performed to confirm that this was the correct patient, site, side and location. The patient did receive antibiotics prior to the incision and was re-dosed during the procedure as needed at indicated intervals.  A tourniquet was not placed.  The patient had the operative extremity prepped and draped in the standard surgical fashion.     We began the procedure with an anterior lateral approach to the proximal humerus.  We divided the skin and subcutaneous tissue down to the deltoid musculature.  We split the raphae between the anterior deltoid and middle deltoid.  We then developed the subdeltoid and subacromial space bluntly.  We identified the rotator cuff which was intact anteriorly.  Superior Tatar cuff also intact.  We then used intraoperative fluoroscopy to identify a good starting point on the apex of the humeral head.  Once we identified this we then split sharply the  supraspinatus tendon in line with the fibers.  We then accessed the proximal humerus with opening reamer.  We then placed a percutaneous reduction aid into the metaphysis of the humerus to allow [place] to manipulate the proximal segment into an anatomic position while we reamed.  We then reamed down the humeral shaft up to the appropriate size with a 7 mm nail.  We then [facility] the nail into place.  We placed 3 multi lock proximal screws into the humeral metaphysis and humeral head which were unicortical.  We then moved down to the distal humerus to apply our interlock screw.  Utilizing perfect circles with intraoperative fluoroscopy we identified the distal interlocking slot.  We then drilled for the 4.0 mm interlocking screw.  We then measured and placed the final screw.  Intraoperative fluoroscopy was used throughout the procedure.  We identified that the nail length was appropriate and the reduction was adequate.  No noted intraoperative complications with her hardware.  We then moved to irrigate wounds.  We irrigated copiously with normal saline.  We closed the supraspinatus tendon with a #1 Vicryl interrupted figure-of-eight's.  We then closed the deep fascia of the deltoid in a similar manner with #1 figure-of-eight Vicryl.  Irrigated once again and then closed skin with 2-0 Vicryl for subcutaneous tissue and staples for the skin.  Standard sterile bandage was applied.  There were no noted intraoperative complications.  The arm was placed in a sling.  He was extubated and transported to PACU in stable condition.  POSTOPERATIVE PLAN:  [name] will return to the medicine service where he can be  discharged home at their discretion.  He can be out of his sling as tolerated and use the left arm immediately for all activities of daily living.  Sling will be just as needed for comfort only.  Apply ice liberally throughout the day.  I will see him back in the office in 2 weeks for wound check, staple removal,  and AP and lateral x-rays of the humerus.

## 2022-02-22 NOTE — Anesthesia Preprocedure Evaluation (Signed)
Anesthesia Evaluation  Patient identified by MRN, date of birth, ID band Patient awake    Reviewed: Allergy & Precautions, H&P , NPO status , Patient's Chart, lab work & pertinent test results  Airway Mallampati: II   Neck ROM: full    Dental   Pulmonary Current Smoker,    breath sounds clear to auscultation       Cardiovascular hypertension,  Rhythm:regular Rate:Normal     Neuro/Psych  Neuromuscular disease    GI/Hepatic hiatal hernia, GERD  ,  Endo/Other  Morbid obesity  Renal/GU      Musculoskeletal Pathological fractures from multiple myeloma.   Abdominal   Peds  Hematology  (+) Blood dyscrasia, anemia , Multiple myeloma   Anesthesia Other Findings   Reproductive/Obstetrics                             Anesthesia Physical Anesthesia Plan  ASA: 3  Anesthesia Plan: General   Post-op Pain Management: Regional block*   Induction:   PONV Risk Score and Plan: 1 and Ondansetron, Dexamethasone, Midazolam and Treatment may vary due to age or medical condition  Airway Management Planned: Oral ETT  Additional Equipment:   Intra-op Plan:   Post-operative Plan: Extubation in OR  Informed Consent: I have reviewed the patients History and Physical, chart, labs and discussed the procedure including the risks, benefits and alternatives for the proposed anesthesia with the patient or authorized representative who has indicated his/her understanding and acceptance.     Dental advisory given  Plan Discussed with: CRNA, Anesthesiologist and Surgeon  Anesthesia Plan Comments:         Anesthesia Quick Evaluation

## 2022-02-22 NOTE — Brief Op Note (Signed)
02/17/2022 - 02/22/2022  1:08 PM  PATIENT:  [name]  51 y.o. male  PRE-OPERATIVE DIAGNOSIS:  Left pathologic humerus fracture  POST-OPERATIVE DIAGNOSIS:  Left pathologic humerus fracture  PROCEDURE:  Procedure(s): INTRAMEDULLARY (IM) NAIL HUMERAL (Left)  SURGEON:  Surgeon(s) and Role:    * Nicholes Stairs, MD - Primary  PHYSICIAN ASSISTANT: Jonelle Sidle, PA-C  ANESTHESIA:   regional and general  EBL:  75 mL   BLOOD ADMINISTERED:none  DRAINS: none   LOCAL MEDICATIONS USED:  NONE  SPECIMEN:  No Specimen  DISPOSITION OF SPECIMEN:  N/A  COUNTS:  YES  TOURNIQUET:  * No tourniquets in log *  DICTATION: .Note written in EPIC  PLAN OF CARE: Admit to inpatient   PATIENT DISPOSITION:  PACU - hemodynamically stable.   Delay start of Pharmacological VTE agent (>24hrs) due to surgical blood loss or risk of bleeding: not applicable

## 2022-02-23 DIAGNOSIS — M84622A Pathological fracture in other disease, left humerus, initial encounter for fracture: Secondary | ICD-10-CM | POA: Diagnosis not present

## 2022-02-23 LAB — CBC WITH DIFFERENTIAL/PLATELET
Abs Immature Granulocytes: 0.04 10*3/uL (ref 0.00–0.07)
Basophils Absolute: 0 10*3/uL (ref 0.0–0.1)
Basophils Relative: 0 %
Eosinophils Absolute: 0 10*3/uL (ref 0.0–0.5)
Eosinophils Relative: 1 %
HCT: 30.7 % — ABNORMAL LOW (ref 39.0–52.0)
Hemoglobin: 10.2 g/dL — ABNORMAL LOW (ref 13.0–17.0)
Immature Granulocytes: 1 %
Lymphocytes Relative: 34 %
Lymphs Abs: 2.8 10*3/uL (ref 0.7–4.0)
MCH: 29.6 pg (ref 26.0–34.0)
MCHC: 33.2 g/dL (ref 30.0–36.0)
MCV: 89 fL (ref 80.0–100.0)
Monocytes Absolute: 0.7 10*3/uL (ref 0.1–1.0)
Monocytes Relative: 8 %
Neutro Abs: 4.6 10*3/uL (ref 1.7–7.7)
Neutrophils Relative %: 56 %
Platelets: 210 10*3/uL (ref 150–400)
RBC: 3.45 MIL/uL — ABNORMAL LOW (ref 4.22–5.81)
RDW: 12.6 % (ref 11.5–15.5)
WBC: 8.1 10*3/uL (ref 4.0–10.5)
nRBC: 0 % (ref 0.0–0.2)

## 2022-02-23 LAB — BASIC METABOLIC PANEL
Anion gap: 7 (ref 5–15)
BUN: 23 mg/dL — ABNORMAL HIGH (ref 6–20)
CO2: 24 mmol/L (ref 22–32)
Calcium: 9.4 mg/dL (ref 8.9–10.3)
Chloride: 100 mmol/L (ref 98–111)
Creatinine, Ser: 1.19 mg/dL (ref 0.61–1.24)
GFR, Estimated: >60 mL/min (ref >60)
Glucose, Bld: 116 mg/dL — ABNORMAL HIGH (ref 70–99)
Potassium: 3.8 mmol/L (ref 3.5–5.1)
Sodium: 131 mmol/L — ABNORMAL LOW (ref 135–145)

## 2022-02-23 LAB — MAGNESIUM: Magnesium: 2 mg/dL (ref 1.7–2.4)

## 2022-02-23 MED ORDER — LISINOPRIL 20 MG PO TABS
20.0000 mg | ORAL_TABLET | Freq: Every day | ORAL | Status: DC
  Administered 2022-02-23 – 2022-02-24 (×2): 20 mg via ORAL
  Filled 2022-02-23 (×2): qty 1

## 2022-02-23 MED ORDER — FUROSEMIDE 40 MG PO TABS
40.0000 mg | ORAL_TABLET | Freq: Once | ORAL | Status: AC
  Administered 2022-02-23: 40 mg via ORAL
  Filled 2022-02-23: qty 1

## 2022-02-23 MED ORDER — POTASSIUM CHLORIDE CRYS ER 20 MEQ PO TBCR
40.0000 meq | EXTENDED_RELEASE_TABLET | Freq: Once | ORAL | Status: AC
  Administered 2022-02-23: 40 meq via ORAL
  Filled 2022-02-23: qty 2

## 2022-02-23 MED ORDER — OXYCODONE HCL 5 MG PO TABS: 15.0000 mg | ORAL_TABLET | Freq: Four times a day (QID) | ORAL | Status: DC | PRN

## 2022-02-23 MED ORDER — FENTANYL CITRATE PF 50 MCG/ML IJ SOSY
50.0000 ug | PREFILLED_SYRINGE | Freq: Four times a day (QID) | INTRAMUSCULAR | Status: DC | PRN
  Administered 2022-02-23 (×2): 50 ug via INTRAVENOUS
  Filled 2022-02-23 (×2): qty 1

## 2022-02-23 NOTE — Plan of Care (Signed)

## 2022-02-23 NOTE — Progress Notes (Signed)
   Subjective:  Patient reports pain as mild to moderate.  Doing better today with less issues moving around the left arm.  Objective:   VITALS:   Vitals:   02/22/22 1424 02/22/22 1549 02/22/22 1919 02/23/22 0700  BP: 118/73 114/72 118/71 (!) 132/95  Pulse: 96 96 92 87  Resp: $Remo'16 14 20 18  'EkatS$ Temp: 98.6 F (37 C) 98.8 F (37.1 C) 98.4 F (36.9 C) 98.3 F (36.8 C)  TempSrc: Oral Oral Oral Oral  SpO2: 98% 97% 95% 96%  Weight:      Height:        Neurologically intact Sensation intact distally Intact pulses distally Compartment soft Motor intact axillary and sensation Sling donned  Lab Results  Component Value Date   WBC 8.1 02/23/2022   HGB 10.2 (L) 02/23/2022   HCT 30.7 (L) 02/23/2022   MCV 89.0 02/23/2022   PLT 210 02/23/2022   BMET    Component Value Date/Time   NA 131 (L) 02/23/2022 0540   NA 135 12/05/2021 1556   K 3.8 02/23/2022 0540   CL 100 02/23/2022 0540   CO2 24 02/23/2022 0540   GLUCOSE 116 (H) 02/23/2022 0540   BUN 23 (H) 02/23/2022 0540   BUN 13 12/05/2021 1556   CREATININE 1.19 02/23/2022 0540   CREATININE 0.91 01/14/2022 1605   CREATININE 1.23 06/01/2012 1150   CALCIUM 9.4 02/23/2022 0540   EGFR 76 12/05/2021 1556   GFRNONAA >60 02/23/2022 0540   GFRNONAA >60 01/14/2022 1605     Assessment/Plan: 1 Day Post-Op   Principal Problem:   Pathologic fracture Active Problems:   Essential hypertension   Chronic GERD   Multiple myeloma not having achieved remission (HCC)   Traumatic compression fracture of lumbar vertebra (HCC)   Normocytic anemia   Hyponatremia   Fall at home, initial encounter   Obesity (BMI 30-39.9)   Advance diet Up with therapy - sling prn for comfort - ok to WBAT through left arm  - return to see Dr. Stann Mainland in 2 weeks   Nicholes Stairs 02/23/2022, 9:42 AM   Geralynn Rile, MD [phone]

## 2022-02-23 NOTE — Progress Notes (Signed)
PROGRESS NOTE                                                                                                                                                                                                             Patient Demographics:    [name], is a 51 y.o. male, DOB - 11-25-1970, DHR:[id number]  Outpatient Primary MD for the patient is [name], MD    LOS - 5  Admit date - 02/17/2022    Chief Complaint  Patient presents with   Fall       Brief Narrative (HPI from H&P)   51 y.o. male with medical history significant of hypertension, hyperlipidemia, GERD, tobacco abuse, and recent diagnosis of multiple myeloma followed by Dr. Lorenso Courier of oncology presents complaining of lower back and left arm pain.  He had gotten up to use the restroom early this morning after he had watched the football games.  He had some pain in his lower back upon sitting up and switching positions to stand up, but pain was sharp and severe causing him to fall.  He tried to brace himself with his left arm, but developed severe pain in the left shoulder.  Patient fell onto the bed and then into the floor.  The ER his work-up was suggestive of left humerus fracture which was acute, some extension of his old L3 compression fracture and new L1 and L2 compression fractures.  He had excruciating lower back pain pain and was admitted for further care.   Subjective:   Patient in bed, appears comfortable, denies any headache, no fever, no chest pain or pressure, no shortness of breath , no abdominal pain. No new focal weakness. Back pain improved, left arm pain improved as well.   Assessment  & Plan :    Pathological proximal left humeral fracture, lumbar L1-L2 and extension of recent kyphoplasty L3 fracture in a patient with multiple myeloma.   He lives alone his left arm is in a sling due to fracture and is currently having excruciating low back  pain and unable to get out of the bed, case discussed with neurosurgeon Dr. Elwin Sleight, he was seen by IR underwent kyphoplasty on 02/19/2022 with improvement in back pain.  For now TLSO brace, pain control and PT OT.  Weightbearing as tolerated, advance activity since he lives alone  wants to spend another day in the hospital before he is comfortable going home with home PT OT.  If stable will try discharge him on 02/23/2022 post L.Arm surgery on 02/22/22.   Left proximal humerus fracture - On 02/22/2022 by Dr. Victorino December orthopedics patient underwent.   Medullary nailing left humeral shaft.   Multiple myeloma with normocytic anemia.  Case discussed with his oncologist Dr. Lorenso Courier he will follow-up postdischarge.  Hyponatremia.  SIADH, hold fluids, pain control, gentle Lasix.  GERD.  On PPI.  Obesity.  BMI 36.  Follow with PCP for weight loss.      Condition - Fair  Family Communication  :  None  Code Status :  Full  Consults  : Orthopedics, neurosurgery Dr. Elwin Sleight, oncologist Dr. Lorenso Courier, IR  PUD Prophylaxis : PPI   Procedures  :     On 02/22/2022 by Dr. Victorino December orthopedics patient underwent.   Medullary nailing left humeral shaft  CT - L spine showing compression fractures at L1 - L2 and L3.      Disposition Plan  :    Status is: Observation  DVT Prophylaxis  :    Place and maintain sequential compression device Start: 02/19/22 0839 enoxaparin (LOVENOX) injection 40 mg Start: 02/17/22 1600   Lab Results  Component Value Date   PLT 210 02/23/2022    Diet :  Diet Order             Diet regular Room service appropriate? Yes; Fluid consistency: Thin; Fluid restriction: 1500 mL Fluid  Diet effective now                    Inpatient Medications  Scheduled Meds:  chlorhexidine  15 mL Mouth/Throat Once   docusate sodium  200 mg Oral BID   enoxaparin (LOVENOX) injection  40 mg Subcutaneous Q24H   lisinopril  20 mg Oral Daily   morphine  60 mg Oral  Q12H   multivitamin with minerals  1 tablet Oral Daily   pantoprazole  40 mg Oral Daily   polyethylene glycol  17 g Oral Daily   Continuous Infusions:  lactated ringers 10 mL/hr at 02/22/22 1043    PRN Meds:.acetaminophen, albuterol, bisacodyl, fentaNYL (SUBLIMAZE) injection, hydrALAZINE, naLOXone (NARCAN)  injection, [DISCONTINUED] ondansetron **OR** ondansetron (ZOFRAN) IV, oxyCODONE  Antibiotics  :    Anti-infectives (From admission, onward)    Start     Dose/Rate Route Frequency Ordered Stop   02/22/22 1045  ceFAZolin (ANCEF) IVPB 3g/100 mL premix        3 g 200 mL/hr over 30 Minutes Intravenous On call to O.R. 02/22/22 1030 02/22/22 1145   02/22/22 1032  ceFAZolin (ANCEF) 3-0.9 GM/100ML-% IVPB       Note to Pharmacy: Nyoka Cowden D: cabinet override      02/22/22 1032 02/22/22 1145   02/19/22 0800  ceFAZolin (ANCEF) IVPB 2g/100 mL premix        2 g 200 mL/hr over 30 Minutes Intravenous  Once 02/18/22 1635 02/19/22 1417        Time Spent in minutes  30   Lala Lund M.D on 02/23/2022 at 9:20 AM  To page go to [email or web address]   [facility] -  Office  [phone]  See all Orders from today for further details    Objective:   Vitals:   02/22/22 1424 02/22/22 1549 02/22/22 1919 02/23/22 0700  BP: 118/73 114/72 118/71 (!) 132/95  Pulse: 96 96 92 87  Resp:  $'16 14 20 18  'B$ Temp: 98.6 F (37 C) 98.8 F (37.1 C) 98.4 F (36.9 C) 98.3 F (36.8 C)  TempSrc: Oral Oral Oral Oral  SpO2: 98% 97% 95% 96%  Weight:      Height:        Wt Readings from Last 3 Encounters:  02/22/22 111.9 kg  02/13/22 111.9 kg  02/10/22 110.6 kg     Intake/Output Summary (Last 24 hours) at 02/23/2022 0920 Last data filed at 02/23/2022 0700 Gross per 24 hour  Intake 600 ml  Output 2150 ml  Net -1550 ml     Physical Exam  Awake Alert, No new F.N deficits, Normal affect [place].AT,PERRAL Supple Neck, No JVD,   Symmetrical Chest wall movement, Good air movement  bilaterally, CTAB RRR,No Gallops, Rubs or new Murmurs,  +ve B.Sounds, Abd Soft, No tenderness,     Left arm in sling with postop dressing on the left shoulder, low back pain is better   Data Review:    CBC Recent Labs  Lab 02/17/22 0520 02/17/22 1546 02/19/22 0745 02/20/22 0453 02/21/22 0643 02/22/22 0640 02/23/22 0540  WBC 7.7   < > 7.6 6.3 6.0 5.6 8.1  HGB 11.3*   < > 10.9* 11.0* 10.9* 10.8* 10.2*  HCT 34.7*   < > 32.5* 33.5* 32.8* 32.4* 30.7*  PLT 213   < > 213 215 204 203 210  MCV 89.4   < > 88.6 89.8 88.6 89.8 89.0  MCH 29.1   < > 29.7 29.5 29.5 29.9 29.6  MCHC 32.6   < > 33.5 32.8 33.2 33.3 33.2  RDW 13.0   < > 13.0 13.0 12.8 12.6 12.6  LYMPHSABS 2.3  --  2.8 1.7  --  2.6 2.8  MONOABS 0.5  --  0.5 0.4  --  0.5 0.7  EOSABS 0.0  --  0.1 0.1  --  0.1 0.0  BASOSABS 0.0  --  0.0 0.0  --  0.0 0.0   < > = values in this interval not displayed.    Electrolytes Recent Labs  Lab 02/17/22 0520 02/18/22 0441 02/19/22 0745 02/20/22 0453 02/20/22 2778 02/21/22 0542 02/21/22 0643 02/22/22 0640 02/23/22 0540  NA 133*   < > 128* 133*  --  132*  --  133* 131*  K 4.0   < > 4.1 4.2  --  4.2  --  4.1 3.8  CL 103   < > 98 95*  --  100  --  100 100  CO2 21*   < > 24 26  --  25  --  26 24  GLUCOSE 116*   < > 93 99  --  100*  --  101* 116*  BUN 19   < > 13 13  --  17  --  18 23*  CREATININE 1.17   < > 1.00 1.08  --  1.03  --  1.07 1.19  CALCIUM 9.6   < > 8.8* 10.5*  --  9.4  --  9.9 9.4  AST 30  --  22 28  --   --   --   --   --   ALT 33  --  30 29  --   --   --   --   --   ALKPHOS 47  --  46 54  --   --   --   --   --   BILITOT 0.5  --  0.8 0.7  --   --   --   --   --  ALBUMIN 2.9*  --  2.6* 2.7*  --   --   --   --   --   MG  --   --  1.8 2.0  --   --  2.2 2.1 2.0  PROCALCITON  --   --   --   --  0.18  --   --   --   --   INR 1.0  --   --   --   --   --   --   --   --   BNP  --   --  25.8 22.9  --   --   --   --   --    < > = values in this interval not displayed.     ------------------------------------------------------------------------------------------------------------------ No results for input(s): "CHOL", "HDL", "LDLCALC", "TRIG", "CHOLHDL", "LDLDIRECT" in the last 72 hours.  Lab Results  Component Value Date   HGBA1C 5.9 (H) 12/05/2021     Radiology Reports DG Humerus Left  Result Date: 02/22/2022 CLINICAL DATA:  Left humerus intramedullary nail. Intraoperative fluoroscopy. EXAM: LEFT HUMERUS - 2+ VIEW COMPARISON:  Left shoulder radiographs 02/17/2022 and 02/04/2022 FINDINGS: Images were performed intraoperatively without the presence of a radiologist. New intramedullary nail fixation of the previously seen pathologic fracture of the proximal humeral diaphysis. No hardware complication is seen. Total fluoroscopy images: 6 Total fluoroscopy time: 84 seconds Total dose: Radiation Exposure Index (as provided by the fluoroscopic device): 8.27 mGy air Kerma Please see intraoperative findings for further detail. IMPRESSION: Intraoperative fluoroscopy provided for intramedullary nail fixation of the known proximal humeral diaphysis pathologic fracture. Electronically Signed   By: Yvonne Kendall M.D.   On: 02/22/2022 13:38   DG C-Arm 1-60 Min-No Report  Result Date: 02/22/2022 Fluoroscopy was utilized by the requesting physician.  No radiographic interpretation.   DG C-Arm 1-60 Min-No Report  Result Date: 02/22/2022 Fluoroscopy was utilized by the requesting physician.  No radiographic interpretation.   DG Chest Port 1 View  Result Date: 02/20/2022 CLINICAL DATA:  Shortness of breath.  Fracture of left shoulder. EXAM: PORTABLE CHEST 1 VIEW COMPARISON:  02/17/2022 FINDINGS: Heart size and mediastinal contours are stable. Lungs are hypoinflated. Pulmonary vascular congestion. No pleural effusion or edema. No airspace disease. Pathologic fracture involving the proximal left humerus is noted. IMPRESSION: 1. Pulmonary vascular congestion. 2. Pathologic  fracture involving the proximal left humerus. Electronically Signed   By: Kerby Moors M.D.   On: 02/20/2022 08:07

## 2022-02-24 ENCOUNTER — Other Ambulatory Visit (HOSPITAL_COMMUNITY): Payer: Self-pay | Admitting: Internal Medicine

## 2022-02-24 ENCOUNTER — Other Ambulatory Visit (HOSPITAL_COMMUNITY): Payer: Self-pay

## 2022-02-24 ENCOUNTER — Other Ambulatory Visit: Payer: Self-pay

## 2022-02-24 ENCOUNTER — Inpatient Hospital Stay: Payer: BC Managed Care – PPO | Attending: Physician Assistant

## 2022-02-24 ENCOUNTER — Encounter (HOSPITAL_COMMUNITY): Payer: Self-pay | Admitting: Orthopedic Surgery

## 2022-02-24 DIAGNOSIS — M47816 Spondylosis without myelopathy or radiculopathy, lumbar region: Secondary | ICD-10-CM | POA: Insufficient documentation

## 2022-02-24 DIAGNOSIS — F17210 Nicotine dependence, cigarettes, uncomplicated: Secondary | ICD-10-CM | POA: Insufficient documentation

## 2022-02-24 DIAGNOSIS — E785 Hyperlipidemia, unspecified: Secondary | ICD-10-CM | POA: Insufficient documentation

## 2022-02-24 DIAGNOSIS — Z79899 Other long term (current) drug therapy: Secondary | ICD-10-CM | POA: Insufficient documentation

## 2022-02-24 DIAGNOSIS — Z8719 Personal history of other diseases of the digestive system: Secondary | ICD-10-CM | POA: Insufficient documentation

## 2022-02-24 DIAGNOSIS — Z7961 Long term (current) use of immunomodulator: Secondary | ICD-10-CM | POA: Insufficient documentation

## 2022-02-24 DIAGNOSIS — S32010A Wedge compression fracture of first lumbar vertebra, initial encounter for closed fracture: Secondary | ICD-10-CM | POA: Diagnosis not present

## 2022-02-24 DIAGNOSIS — K219 Gastro-esophageal reflux disease without esophagitis: Secondary | ICD-10-CM | POA: Diagnosis not present

## 2022-02-24 DIAGNOSIS — Z9889 Other specified postprocedural states: Secondary | ICD-10-CM

## 2022-02-24 DIAGNOSIS — M4802 Spinal stenosis, cervical region: Secondary | ICD-10-CM | POA: Insufficient documentation

## 2022-02-24 DIAGNOSIS — M4856XA Collapsed vertebra, not elsewhere classified, lumbar region, initial encounter for fracture: Secondary | ICD-10-CM | POA: Insufficient documentation

## 2022-02-24 DIAGNOSIS — C9000 Multiple myeloma not having achieved remission: Secondary | ICD-10-CM | POA: Insufficient documentation

## 2022-02-24 DIAGNOSIS — K5900 Constipation, unspecified: Secondary | ICD-10-CM | POA: Insufficient documentation

## 2022-02-24 DIAGNOSIS — I10 Essential (primary) hypertension: Secondary | ICD-10-CM | POA: Insufficient documentation

## 2022-02-24 DIAGNOSIS — Z7982 Long term (current) use of aspirin: Secondary | ICD-10-CM | POA: Insufficient documentation

## 2022-02-24 DIAGNOSIS — M84622A Pathological fracture in other disease, left humerus, initial encounter for fracture: Secondary | ICD-10-CM | POA: Diagnosis not present

## 2022-02-24 DIAGNOSIS — Z800 Family history of malignant neoplasm of digestive organs: Secondary | ICD-10-CM | POA: Insufficient documentation

## 2022-02-24 DIAGNOSIS — Z5112 Encounter for antineoplastic immunotherapy: Secondary | ICD-10-CM | POA: Insufficient documentation

## 2022-02-24 DIAGNOSIS — K449 Diaphragmatic hernia without obstruction or gangrene: Secondary | ICD-10-CM | POA: Insufficient documentation

## 2022-02-24 DIAGNOSIS — M4316 Spondylolisthesis, lumbar region: Secondary | ICD-10-CM | POA: Insufficient documentation

## 2022-02-24 DIAGNOSIS — E871 Hypo-osmolality and hyponatremia: Secondary | ICD-10-CM | POA: Diagnosis not present

## 2022-02-24 HISTORY — PX: IR BONE TUMOR(S)RF ABLATION: IMG2284

## 2022-02-24 LAB — BASIC METABOLIC PANEL
Anion gap: 10 (ref 5–15)
BUN: 14 mg/dL (ref 6–20)
CO2: 25 mmol/L (ref 22–32)
Calcium: 9.9 mg/dL (ref 8.9–10.3)
Chloride: 96 mmol/L — ABNORMAL LOW (ref 98–111)
Creatinine, Ser: 1.07 mg/dL (ref 0.61–1.24)
GFR, Estimated: >60 mL/min (ref >60)
Glucose, Bld: 111 mg/dL — ABNORMAL HIGH (ref 70–99)
Potassium: 4.3 mmol/L (ref 3.5–5.1)
Sodium: 131 mmol/L — ABNORMAL LOW (ref 135–145)

## 2022-02-24 LAB — SURGICAL PATHOLOGY

## 2022-02-24 MED ORDER — ONDANSETRON HCL 4 MG PO TABS: 4.0000 mg | ORAL_TABLET | Freq: Four times a day (QID) | ORAL | Status: DC | PRN

## 2022-02-24 MED ORDER — ACETAMINOPHEN 500 MG PO TABS
500.0000 mg | ORAL_TABLET | Freq: Three times a day (TID) | ORAL | 0 refills | Status: DC | PRN
  Filled 2022-02-24: qty 30, 10d supply, fill #0

## 2022-02-24 MED ORDER — ONDANSETRON HCL 4 MG/2ML IJ SOLN: 4.0000 mg | Freq: Four times a day (QID) | INTRAMUSCULAR | Status: DC | PRN

## 2022-02-24 MED ORDER — CEFAZOLIN SODIUM-DEXTROSE 1-4 GM/50ML-% IV SOLN: 1.0000 g | Freq: Four times a day (QID) | INTRAVENOUS | Status: DC

## 2022-02-24 MED ORDER — METOPROLOL TARTRATE 50 MG PO TABS
50.0000 mg | ORAL_TABLET | Freq: Two times a day (BID) | ORAL | Status: DC
  Administered 2022-02-24: 50 mg via ORAL
  Filled 2022-02-24: qty 1

## 2022-02-24 MED ORDER — DOCUSATE SODIUM 100 MG PO CAPS
100.0000 mg | ORAL_CAPSULE | Freq: Two times a day (BID) | ORAL | 0 refills | Status: DC | PRN
  Filled 2022-02-24: qty 25, 13d supply, fill #0

## 2022-02-24 MED ORDER — POLYETHYLENE GLYCOL 3350 17 GM/SCOOP PO POWD
17.0000 g | Freq: Every day | ORAL | 0 refills | Status: AC
  Filled 2022-02-24: qty 238, 14d supply, fill #0

## 2022-02-24 MED ORDER — METOCLOPRAMIDE HCL 5 MG PO TABS: 5.0000 mg | ORAL_TABLET | Freq: Three times a day (TID) | ORAL | Status: DC | PRN

## 2022-02-24 MED ORDER — METOCLOPRAMIDE HCL 5 MG/ML IJ SOLN: 5.0000 mg | Freq: Three times a day (TID) | INTRAMUSCULAR | Status: DC | PRN

## 2022-02-24 MED ORDER — FUROSEMIDE 40 MG PO TABS
40.0000 mg | ORAL_TABLET | Freq: Once | ORAL | Status: AC
  Administered 2022-02-24: 40 mg via ORAL
  Filled 2022-02-24: qty 1

## 2022-02-24 MED ORDER — METOPROLOL TARTRATE 50 MG PO TABS
50.0000 mg | ORAL_TABLET | Freq: Two times a day (BID) | ORAL | 0 refills | Status: DC
  Filled 2022-02-24: qty 60, 30d supply, fill #0

## 2022-02-24 MED ORDER — OXYCODONE HCL 15 MG PO TABS
15.0000 mg | ORAL_TABLET | Freq: Three times a day (TID) | ORAL | 0 refills | Status: DC | PRN
  Filled 2022-02-24: qty 20, 7d supply, fill #0

## 2022-02-24 MED ORDER — MORPHINE SULFATE ER 30 MG PO TBCR
60.0000 mg | EXTENDED_RELEASE_TABLET | Freq: Two times a day (BID) | ORAL | 0 refills | Status: DC
  Filled 2022-02-24: qty 8, 2d supply, fill #0

## 2022-02-24 NOTE — Progress Notes (Signed)
Physical Therapy Treatment Patient Details Name: [name] MRN: [id number] DOB: 11-20-1970 Today's Date: 02/24/2022   History of Present Illness 51 yo male presents to [facility] on 9/25 with fall, + head trauma. CT scan of the head and cervical spine noted widespread cervical and visible upper thoracic vertebral involvement by multiple myeloma without any focal cervical fracture; L1-2 compression fx. X-rays of the left shoulder revealed oblique displaced fracture of the proximal left humeral shaft with associated lucent lesion concerning for pathologic compression fracture.   s/p FLUOROSCOPY GUIDED L1 AND L2 RADIOFREQUENCY ABLATION AND BALLOON KYPHOPLASTY on 9/27. s/p Medullary nailing left humeral shaft on 9/30. PMH includes kyphoplasty for L3 compression fx on 9/13, hypertension, hyperlipidemia, GERD, tobacco abuse, and recent diagnosis of multiple myeloma with LUE lesions.    PT Comments    PT focused session on pt independence in donning/doffing back brace, functionally reviewing back precautions, stair training and mobility progression. Pt mobilizing at mod I level for increased time only, is not requiring physical assist at this time. Pt plans to d/c home to his sister's house so he will have increased support once d/c. PT to sign off, all PT goals met and pt with no further questions.      Recommendations for follow up therapy are one component of a multi-disciplinary discharge planning process, led by the attending physician.  Recommendations may be updated based on patient status, additional functional criteria and insurance authorization.  Follow Up Recommendations  Home health PT     Assistance Recommended at Discharge Set up Supervision/Assistance  Patient can return home with the following A little help with walking and/or transfers;A little help with bathing/dressing/bathroom   Equipment Recommendations  BSC/3in1    Recommendations for Other Services       Precautions /  Restrictions Precautions Precautions: Back;Fall Precaution Comments: pt with good functional understanding of back precautions Required Braces or Orthoses: Spinal Brace;Sling Spinal Brace: Thoracolumbosacral orthotic;Applied in sitting position Restrictions Weight Bearing Restrictions: Yes LUE Weight Bearing: Weight bearing as tolerated Other Position/Activity Restrictions: per MD op note, pt okay to be out of LUE sling as tolerated, WBAT     Mobility  Bed Mobility Overal bed mobility: Modified Independent             General bed mobility comments: mod I for increased time only, good log roll technique    Transfers Overall transfer level: Modified independent Equipment used: None Transfers: Sit to/from Stand Sit to Stand: Modified independent (Device/Increase time)           General transfer comment: mod I for increased time to rise, bed height elevated to match bed at home    Ambulation/Gait Ambulation/Gait assistance: Modified independent (Device/Increase time) Gait Distance (Feet): 175 Feet Assistive device: None Gait Pattern/deviations: Step-through pattern, Decreased stride length Gait velocity: decr     General Gait Details: increased time   Stairs Stairs: Yes Stairs assistance: Supervision Stair Management: One rail Right, Alternating pattern, Forwards Number of Stairs: 4     Wheelchair Mobility    Modified Rankin (Stroke Patients Only)       Balance Overall balance assessment: Needs assistance Sitting-balance support: Single extremity supported, Feet supported Sitting balance-Leahy Scale: Good     Standing balance support: Single extremity supported, During functional activity Standing balance-Leahy Scale: Fair Standing balance comment: stood at sink with supervision for grooming tasks  Cognition Arousal/Alertness: Awake/alert Behavior During Therapy: WFL for tasks assessed/performed Overall  Cognitive Status: Within Functional Limits for tasks assessed                                          Exercises      General Comments        Pertinent Vitals/Pain Pain Assessment Pain Assessment: Faces Faces Pain Scale: Hurts little more Pain Location: back and LUE shoulder Pain Descriptors / Indicators: Sore, Aching, Grimacing Pain Intervention(s): Limited activity within patient's tolerance, Monitored during session, Repositioned    Home Living                          Prior Function            PT Goals (current goals can now be found in the care plan section) Acute Rehab PT Goals PT Goal Formulation: With patient Time For Goal Achievement: 03/04/22 Potential to Achieve Goals: Good Progress towards PT goals: Progressing toward goals    Frequency    Min 3X/week      PT Plan Current plan remains appropriate    Co-evaluation              AM-PAC PT "6 Clicks" Mobility   Outcome Measure  Help needed turning from your back to your side while in a flat bed without using bedrails?: None Help needed moving from lying on your back to sitting on the side of a flat bed without using bedrails?: None Help needed moving to and from a bed to a chair (including a wheelchair)?: None Help needed standing up from a chair using your arms (e.g., wheelchair or bedside chair)?: None Help needed to walk in hospital room?: None Help needed climbing 3-5 steps with a railing? : A Little 6 Click Score: 23    End of Session Equipment Utilized During Treatment: Back brace Activity Tolerance: Patient tolerated treatment well Patient left: with call bell/phone within reach Nurse Communication: Mobility status PT Visit Diagnosis: Other abnormalities of gait and mobility (R26.89);Muscle weakness (generalized) (M62.81)     Time: 4097-3532 PT Time Calculation (min) (ACUTE ONLY): 13 min  Charges:  $Therapeutic Activity: 8-22 mins                     Stacie Glaze, PT DPT Acute Rehabilitation Services Pager [phone]  Office [phone]    [place] E Ruffin Pyo 02/24/2022, 11:32 AM

## 2022-02-24 NOTE — Progress Notes (Signed)
Occupational Therapy Treatment Patient Details Name: [name] MRN: [id number] DOB: 1971-03-04 Today's Date: 02/24/2022   History of present illness 51 yo male presents to [facility] on 9/25 with fall, + head trauma. CT scan of the head and cervical spine noted widespread cervical and visible upper thoracic vertebral involvement by multiple myeloma without any focal cervical fracture; L1-2 compression fx. X-rays of the left shoulder revealed oblique displaced fracture of the proximal left humeral shaft with associated lucent lesion concerning for pathologic compression fracture.   s/p FLUOROSCOPY GUIDED L1 AND L2 RADIOFREQUENCY ABLATION AND BALLOON KYPHOPLASTY on 9/27. s/p Medullary nailing left humeral shaft on 9/30. PMH includes kyphoplasty for L3 compression fx on 9/13, hypertension, hyperlipidemia, GERD, tobacco abuse, and recent diagnosis of multiple myeloma with LUE lesions.   OT comments  Patient in bathroom upon entry in room. Patient able to transfer self to bathroom without assistance or assistive device. Patient states he is able to perform toilet hygiene but takes increased time due to back precautions. Patient able to perform grooming tasks standing at sink setup and supervision. Patient was able to use LUE to hold toothbrush and other gross assist activities. Patient declined changing clothes at this time due to awaiting his sister to bring a clean change of clothing. Patient was mod assist to donn gown and ambulated in hallway with LUE lightly swaying with walk.  Patient is expected to return home today with [place].   Recommendations for follow up therapy are one component of a multi-disciplinary discharge planning process, led by the attending physician.  Recommendations may be updated based on patient status, additional functional criteria and insurance authorization.    Follow Up Recommendations  Home health OT    Assistance Recommended at Discharge Frequent or constant  Supervision/Assistance  Patient can return home with the following  Assist for transportation;Help with stairs or ramp for entrance;A little help with walking and/or transfers;A lot of help with bathing/dressing/bathroom   Equipment Recommendations  Other (comment) (TBD)    Recommendations for Other Services      Precautions / Restrictions Precautions Precautions: Back;Fall Precaution Comments: able to recall back precautions Required Braces or Orthoses: Spinal Brace;Sling Spinal Brace: Thoracolumbosacral orthotic;Applied in sitting position Restrictions Weight Bearing Restrictions: Yes LUE Weight Bearing: Weight bearing as tolerated Other Position/Activity Restrictions: per MD op note, pt okay to be out of LUE sling as tolerated, WBAT       Mobility Bed Mobility Overal bed mobility: Modified Independent             General bed mobility comments: increased time to maintain back precautions    Transfers Overall transfer level: Modified independent Equipment used: None Transfers: Sit to/from Stand Sit to Stand: Modified independent (Device/Increase time)           General transfer comment: mod. independent with toilet transfers     Balance Overall balance assessment: Needs assistance Sitting-balance support: Single extremity supported, Feet supported Sitting balance-Leahy Scale: Good     Standing balance support: No upper extremity supported, During functional activity Standing balance-Leahy Scale: Fair Standing balance comment: stood at sink with supervision for grooming tasks                           ADL either performed or assessed with clinical judgement   ADL Overall ADL's : Needs assistance/impaired     Grooming: Wash/dry hands;Wash/dry face;Oral care;Set up;Standing Grooming Details (indicate cue type and reason): able to use LUE to  hold toothbrush to apply toothpaste         Upper Body Dressing : Moderate assistance;Standing Upper  Body Dressing Details (indicate cue type and reason): to [employer] Transfer: Modified Programmer, applications Details (indicate cue type and reason): patient in bathroom upon entry [place] and Hygiene: Modified independent Toileting - Clothing Manipulation Details (indicate cue type and reason): patient states he is able to perform toilet hygiene but takes increased time due to back precautions       General ADL Comments: declined changing clothing due to waiting for sister to bring clean change    Extremity/Trunk Assessment Upper Extremity Assessment LUE Deficits / Details: LUE sling PRN, use as tolerated LUE Sensation: WNL LUE Coordination: decreased gross motor            Vision       Perception     Praxis      Cognition Arousal/Alertness: Awake/alert Behavior During Therapy: WFL for tasks assessed/performed Overall Cognitive Status: Within Functional Limits for tasks assessed                                          Exercises      Shoulder Instructions       General Comments      Pertinent Vitals/ Pain       Pain Assessment Pain Assessment: Faces Faces Pain Scale: Hurts little more Pain Location: back and LUE shoulder Pain Descriptors / Indicators: Sore, Aching, Grimacing Pain Intervention(s): Limited activity within patient's tolerance, Monitored during session, Repositioned  Home Living                                          Prior Functioning/Environment              Frequency  Min 2X/week        Progress Toward Goals  OT Goals(current goals can now be found in the care plan section)  Progress towards OT goals: Progressing toward goals  Acute Rehab OT Goals Patient Stated Goal: go home OT Goal Formulation: With patient Time For Goal Achievement: 03/04/22 Potential to Achieve Goals: Good ADL Goals Pt Will Perform Grooming: with supervision;standing Pt  Will Perform Upper Body Dressing: with min assist;sitting Pt Will Perform Lower Body Dressing: with supervision;sit to/from stand Pt Will Transfer to Toilet: with supervision;ambulating Pt Will Perform Toileting - Clothing Manipulation and hygiene: with supervision;sit to/from stand  Plan Discharge plan remains appropriate    Co-evaluation                 AM-PAC OT "6 Clicks" Daily Activity     Outcome Measure   Help from another person eating meals?: None Help from another person taking care of personal grooming?: A Little Help from another person toileting, which includes using toliet, bedpan, or urinal?: A Little Help from another person bathing (including washing, rinsing, drying)?: A Little Help from another person to put on and taking off regular upper body clothing?: A Little Help from another person to put on and taking off regular lower body clothing?: A Lot 6 Click Score: 18    End of Session Equipment Utilized During Treatment: Back brace  OT Visit Diagnosis: Unsteadiness on feet (R26.81);Muscle weakness (generalized) (M62.81);History of falling (Z91.81);Pain  Pain - Right/Left: Left Pain - part of body: Shoulder   Activity Tolerance Patient tolerated treatment well   Patient Left in chair;with call bell/phone within reach   Nurse Communication Mobility status        Time: 2244-9753 OT Time Calculation (min): 30 min  Charges: OT General Charges $OT Visit: 1 Visit OT Treatments $Self Care/Home Management : 23-37 mins  Lodema Hong, [place]  Office [phone]   Trixie Dredge 02/24/2022, 12:19 PM

## 2022-02-24 NOTE — Discharge Summary (Signed)
                                                                                 Clevon J Turnbough MRN:[id number] DOB: 12/12/1970 DOA: 02/17/2022  PCP: Wilson, Amelia, MD  Admit date: 02/17/2022  Discharge date: 02/24/2022  Admitted From: Home   Disposition:  Home   Recommendations for Outpatient Follow-up:   Follow up with PCP in 1-2 weeks  PCP Please obtain BMP/CBC, 2 view CXR in 1week,  (see Discharge instructions)   PCP Please follow up on the following pending results:    Home Health: PT, OT if qualifies   Equipment/Devices: as below  Consultations: IR, Ortho, Haem - Onc over the phone Discharge Condition: Stable    CODE STATUS: Full    Diet Recommendation: Heart Healthy   Diet Order             Diet - low sodium heart healthy           Diet regular Room service appropriate? Yes; Fluid consistency: Thin; Fluid restriction: 1500 mL Fluid  Diet effective now                    Chief Complaint  Patient presents with   Fall     Brief history of present illness from the day of admission and additional interim summary    51 y.o. male with medical history significant of hypertension, hyperlipidemia, GERD, tobacco abuse, and recent diagnosis of multiple myeloma followed by Dr. Dorsey of oncology presents complaining of lower back and left arm pain.  He had gotten up to use the restroom early this morning after he had watched the football games.  He had some pain in his lower back upon sitting up and switching positions to stand up, but pain was sharp and severe causing him to fall.  He tried to brace himself with his left arm, but developed severe pain in the left shoulder.  Patient fell onto the bed and then into the floor.  The ER his work-up was suggestive of left humerus fracture which was acute, some extension of his old L3 compression fracture and new L1 and L2 compression  fractures.  He had excruciating lower back pain pain and was admitted for further care.                                                                 Hospital Course   Pathological proximal left humeral fracture, lumbar L1-L2 and extension of recent kyphoplasty L3 fracture in a patient with multiple myeloma.    He lives alone his left arm is in a sling due to fracture and is currently having excruciating low back pain and unable to get out of the bed, case discussed with neurosurgeon Dr. Troy Dawley, he was seen by IR underwent kyphoplasty on 02/19/2022 with improvement in back pain.  For now TLSO brace, pain control and PT OT.  Weightbearing as tolerated, be   discharged with home PT OT if he qualifies.    Left proximal humerus fracture - On 02/22/2022 by Dr. Victorino December orthopedics patient underwent.   Medullary nailing left humeral shaft.  He is much improved, weightbearing as tolerated, left arm sling for comfort, will be discharged home with outpatient follow-up with orthopedics and PCP.   Multiple myeloma with normocytic anemia.  Case discussed with his oncologist Dr. Lorenso Courier he will follow-up postdischarge.   Hyponatremia.  SIADH, hold fluids, pain control, gentle Lasix, PCP to recheck next visit.   GERD.  On PPI.   Obesity.  BMI 36.  Follow with PCP for weight loss.   Discharge diagnosis     Principal Problem:   Pathologic fracture Active Problems:   Traumatic compression fracture of lumbar vertebra (HCC)   Fall at home, initial encounter   Multiple myeloma not having achieved remission (HCC)   Normocytic anemia   Essential hypertension   Chronic GERD   Hyponatremia   Obesity (BMI 30-39.9)    Discharge instructions    Discharge Instructions     Diet - low sodium heart healthy   Complete by: As directed    Discharge instructions   Complete by: As directed    For back surgery no lifting >10 lb for two weeks, keep incision sites clean and dry.  Orthopedic discharge  instruction:  -maintain postoperative bandages until your follow-up appointment.  You may shower with these in place.  Do not submerge underwater.  -Sling to be worn for comfort only.  You may remove and use the arm as tolerated.  -Apply ice liberally to the shoulder throughout the day 20 to 30 minutes at a time.  -Return to see Dr. Stann Mainland in the office in 2 weeks.  Follow with Primary MD [name], MD and your oncologist Dr. Lorenso Courier in 7 days   Get CBC, CMP, Magnesium , 2 view Chest X ray -  checked next visit within 1 week by Primary MD   Activity: As tolerated with Full fall precautions use walker/cane & assistance as needed  Disposition Home    Diet: Heart Healthy    Special Instructions: If you have smoked or chewed Tobacco  in the last 2 yrs please stop smoking, stop any regular Alcohol  and or any Recreational drug use.  On your next visit with your primary care physician please Get Medicines reviewed and adjusted.  Please request your Prim.MD to go over all Hospital Tests and Procedure/Radiological results at the follow up, please get all Hospital records sent to your Prim MD by signing hospital release before you go home.  If you experience worsening of your admission symptoms, develop shortness of breath, life threatening emergency, suicidal or homicidal thoughts you must seek medical attention immediately by calling 911 or calling your MD immediately  if symptoms less severe.  You Must read complete instructions/literature along with all the possible adverse reactions/side effects for all the Medicines you take and that have been prescribed to you. Take any new Medicines after you have completely understood and accpet all the possible adverse reactions/side effects.   Discharge wound care:   Complete by: As directed    For back surgery no lifting >10 lb for two weeks, keep incision sites clean and dry.  Orthopedic discharge instruction:  -maintain postoperative  bandages until your follow-up appointment.  You may shower with these in place.  Do not submerge underwater.  -Sling to be worn for comfort only.  You may remove and use  the arm as tolerated.  -Apply ice liberally to the shoulder throughout the day 20 to 30 minutes at a time.   Increase activity slowly   Complete by: As directed        Discharge Medications   Allergies as of 02/24/2022   No Known Allergies      Medication List     STOP taking these medications    lenalidomide 25 MG capsule Commonly known as: REVLIMID   rosuvastatin 10 MG tablet Commonly known as: Crestor       TAKE these medications    acetaminophen 500 MG tablet Commonly known as: TYLENOL Take 1 tablet (500 mg total) by mouth every 8 (eight) hours as needed for mild pain or moderate pain. What changed: when to take this   docusate sodium 100 MG capsule Commonly known as: COLACE Take 1 capsule (100 mg total) by mouth 2 (two) times daily as needed for mild constipation. What changed:  when to take this reasons to take this   hydrochlorothiazide 25 MG tablet Commonly known as: HYDRODIURIL Take 1 tablet (25 mg total) by mouth daily.   ibuprofen 600 MG tablet Commonly known as: ADVIL Take 1 tablet (600 mg total) by mouth every 6 (six) hours as needed. What changed: reasons to take this   lisinopril 40 MG tablet Commonly known as: ZESTRIL Take 1 tablet (40 mg total) by mouth daily.   metoprolol tartrate 50 MG tablet Commonly known as: LOPRESSOR Take 1 tablet (50 mg total) by mouth 2 (two) times daily.   morphine 30 MG 12 hr tablet Commonly known as: MS CONTIN Take 2 tablets (60 mg total) by mouth every 12 (twelve) hours.   oxyCODONE 15 MG immediate release tablet Commonly known as: ROXICODONE Take 1 tablet (15 mg total) by mouth every 8 (eight) hours as needed for severe pain or breakthrough pain. What changed:  medication strength how much to take when to take this reasons to take  this   pantoprazole 40 MG tablet Commonly known as: PROTONIX Take 1 tablet (40 mg total) by mouth daily.   polyethylene glycol 17 g packet Commonly known as: MIRALAX / GLYCOLAX Take 17 g by mouth daily.               Durable Medical Equipment  (From admission, onward)           Start     Ordered   02/21/22 0748  For home use only DME Cane  Once        02/21/22 0747   02/20/22 1400  For home use only DME 3 n 1  Once        02/20/22 1400              Discharge Care Instructions  (From admission, onward)           Start     Ordered   02/24/22 0000  Discharge wound care:       Comments: For back surgery no lifting >10 lb for two weeks, keep incision sites clean and dry.  Orthopedic discharge instruction:  -maintain postoperative bandages until your follow-up appointment.  You may shower with these in place.  Do not submerge underwater.  -Sling to be worn for comfort only.  You may remove and use the arm as tolerated.  -Apply ice liberally to the shoulder throughout the day 20 to 30 minutes at a time.   02/24/22 0844               Follow-up Information     Rogers, Jason Patrick, MD. Schedule an appointment as soon as possible for a visit in 2 week(s).   Specialty: Orthopedic Surgery Why: For suture removal, For wound re-check Contact information: [address] [place] [address] [phone]         Wilson, Amelia, MD. Schedule an appointment as soon as possible for a visit.   Specialty: Family Medicine Contact information: [address] [place] [address] [phone]         [facility] Follow up.   Specialty: Home Health Services Why: [facility] will call you within 24-48 hours of your discharge to home to set up home health therapy services including PT/OT. Contact information: [address] [place] [address] [phone]         [facility]  Follow up.   Why: [facility] will be providing a 3:1 to the hospital room prior to discharge to home.        Dorsey, John T IV, MD. Schedule an appointment as soon as possible for a visit in 1 week(s).   Specialty: Hematology and Oncology Contact information: [address] [place] [address] [phone]                 Major procedures and Radiology Reports - PLEASE review detailed and final reports thoroughly  -     DG Humerus Left  Result Date: 02/22/2022 CLINICAL DATA:  Left humerus intramedullary nail. Intraoperative fluoroscopy. EXAM: LEFT HUMERUS - 2+ VIEW COMPARISON:  Left shoulder radiographs 02/17/2022 and 02/04/2022 FINDINGS: Images were performed intraoperatively without the presence of a radiologist. New intramedullary nail fixation of the previously seen pathologic fracture of the proximal humeral diaphysis. No hardware complication is seen. Total fluoroscopy images: 6 Total fluoroscopy time: 84 seconds Total dose: Radiation Exposure Index (as provided by the fluoroscopic device): 8.27 mGy air Kerma Please see intraoperative findings for further detail. IMPRESSION: Intraoperative fluoroscopy provided for intramedullary nail fixation of the known proximal humeral diaphysis pathologic fracture. Electronically Signed   By: Ronald  Viola M.D.   On: 02/22/2022 13:38   DG C-Arm 1-60 Min-No Report  Result Date: 02/22/2022 Fluoroscopy was utilized by the requesting physician.  No radiographic interpretation.   DG C-Arm 1-60 Min-No Report  Result Date: 02/22/2022 Fluoroscopy was utilized by the requesting physician.  No radiographic interpretation.   DG Chest Port 1 View  Result Date: 02/20/2022 CLINICAL DATA:  Shortness of breath.  Fracture of left shoulder. EXAM: PORTABLE CHEST 1 VIEW COMPARISON:  02/17/2022 FINDINGS: Heart size and mediastinal contours are stable. Lungs are hypoinflated. Pulmonary vascular congestion. No pleural effusion or edema. No airspace  disease. Pathologic fracture involving the proximal left humerus is noted. IMPRESSION: 1. Pulmonary vascular congestion. 2. Pathologic fracture involving the proximal left humerus. Electronically Signed   By: Taylor  Stroud M.D.   On: 02/20/2022 08:07   DG Shoulder Left  Result Date: 02/17/2022 CLINICAL DATA:  Fall EXAM: LEFT SHOULDER - 3 VIEW COMPARISON:  Shoulder radiograph dated February 04, 2022 FINDINGS: Oblique displaced fracture of the proximal left humeral shaft. Associated lucent lesion of the proximal left humerus is again seen. Associated soft tissue edema of the proximal left arm. Visualized lung is clear. IMPRESSION: Oblique displaced fracture of the proximal left humeral shaft with associated lucent lesion, findings are concerning for pathologic compression fracture. Electronically Signed   By: Leah  Strickland M.D.   On: 02/17/2022 13:13   DG Pelvis   Portable  Result Date: 02/17/2022 CLINICAL DATA:  Fall. EXAM: PORTABLE PELVIS 1-2 VIEWS COMPARISON:  CT abdomen and pelvis 07/21/2021 FINDINGS: No acute fracture or pelvic diastasis is identified. There is mild left hip joint space narrowing. IMPRESSION: No acute osseous abnormality identified. Electronically Signed   By: Allen  Grady M.D.   On: 02/17/2022 13:09   DG Chest Portable 1 View  Result Date: 02/17/2022 CLINICAL DATA:  Fall. EXAM: PORTABLE CHEST 1 VIEW COMPARISON:  Chest radiographs 05/04/2019 FINDINGS: The cardiomediastinal silhouette is unchanged with normal heart size. Lung volumes are low with bronchovascular crowding. No confluent airspace opacity, overt pulmonary edema, sizable pleural effusion, or pneumothorax is identified. No acute osseous abnormality is seen. IMPRESSION: No active disease. Electronically Signed   By: Allen  Grady M.D.   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 Low back pain. Status post  recent L3 vertebral body biopsy, RF ablation, augmentation on 02/05/2022. Pathology revealed plasmacytoma. EXAM: CT LUMBAR SPINE WITHOUT CONTRAST TECHNIQUE: Multidetector CT imaging of the lumbar spine was performed without intravenous contrast administration. Multiplanar CT image reconstructions were also generated. RADIATION DOSE REDUCTION: This exam was performed according to the departmental dose-optimization program which includes automated exposure control, adjustment of the mA and/or kV according to patient size and/or use of iterative reconstruction technique. COMPARISON:  Lumbar MRI 01/07/2022. CT Abdomen and Pelvis 07/21/2021. FINDINGS: Segmentation: Normal. Alignment: Stable lumbar lordosis. Vertebrae: Pronounced heterogeneity of bone mineralization since February this year. Diffuse lytic vertebral and visible pelvic bone lesions. Mild pathologic compression fracture of L1. More moderate pathologic compression fracture of L2, with lucency of the anterior superior endplate and 22% central loss of vertebral body height compared to February. No retropulsion of bone. L2 pedicles and posterior elements appear intact. Augmented moderate to severe L3 pathologic compression fracture. No significant L4 vertebral compression. Subtle L5 pathologic vertebral compression. Visible sacral ala appear intact despite myelomas involvement. Paraspinal and other soft tissues: Negative visible noncontrast abdominal viscera. Lumbar paraspinal soft tissues remain within normal limits. Disc levels: Lumbar spine degeneration predominantly moderate to severe facet arthropathy at L4-L5 where there is subtle chronic grade 1 anterolisthesis. IMPRESSION: 1. Diffuse Multiple Myeloma involvement of the visible spine and pelvis, new since a February CT. 2. Augmented compression fracture of L3. Mild-to-moderate pathologic compression fracture of L2 (22% loss of vertebral body height, no complicating features). Mild pathologic compression  fracture of L1. Electronically Signed   By: H  Hall M.D.   On: 02/17/2022 09:46   CT Cervical Spine Wo Contrast  Result Date: 02/17/2022 CLINICAL DATA:  50-year-old male with monoclonal gammopathy/multiple myeloma. Low back pain. Status post recent L3 vertebral body biopsy, RF ablation, augmentation on 02/05/2022. Pathology revealed plasmacytoma. Neck pain. EXAM: CT CERVICAL SPINE WITHOUT CONTRAST TECHNIQUE: Multidetector CT imaging of the cervical spine was performed without intravenous contrast. Multiplanar CT image reconstructions were also generated. RADIATION DOSE REDUCTION: This exam was performed according to the departmental dose-optimization program which includes automated exposure control, adjustment of the mA and/or kV according to patient size and/or use of iterative reconstruction technique. COMPARISON:  Cervical spine radiographs 12/18/2021. FINDINGS: Alignment: Straightening of cervical lordosis. Cervicothoracic junction alignment is within normal limits. Bilateral posterior element alignment is within normal limits. Skull base and vertebrae: Visualized skull base is intact. No atlanto-occipital dissociation. C1 and C2 appear intact and aligned. Widely scattered ill-defined lucent lesions are present elsewhere in the cervical spine and visible upper   thoracic spine C3 through T2. No cervical pathologic fracture is identified. Posterior element involvement is noted on the right at C4 (series 4, image 44). No expansile cervical lesion at this time. Soft tissues and spinal canal: No prevertebral fluid or swelling. No visible canal hematoma. Negative noncontrast visible neck soft tissues. Disc levels: Widespread superimposed cervical spine disc and endplate degeneration. Multilevel degenerative cervical spinal stenosis, mild to moderate at C5-C6. Upper chest: T1 and T2 vertebral myeloma thus involvement, including an indistinct 10 mm lucent lesion of the left T1 body. Lung apices are clear. Negative  visible noncontrast superior mediastinum. Other: Negative visible noncontrast posterior fossa. Visible paranasal sinuses, tympanic cavities and mastoids are well aerated. IMPRESSION: 1. Widespread cervical and visible upper thoracic vertebral involvement by Multiple Myeloma. No cervical pathologic fracture. 2. Superimposed bulky cervical spine degeneration. Subsequent mild to moderate degenerative spinal stenosis suspected at C5-C6. Electronically Signed   By: H  Hall M.D.   On: 02/17/2022 09:41   CT BONE MARROW BIOPSY & ASPIRATION  Result Date: 02/10/2022 INDICATION: 50-year-old male with monoclonal gammopathy of uncertain significance and possible multiple myeloma. EXAM: CT GUIDED BONE MARROW ASPIRATION AND CORE BIOPSY Interventional Radiologist:  Heath K. McCullough, MD MEDICATIONS: None. ANESTHESIA/SEDATION: None FLUOROSCOPY: None COMPLICATIONS: None immediate. Estimated blood loss: <25 mL PROCEDURE: Informed written consent was obtained from the patient after a thorough discussion of the procedural risks, benefits and alternatives. All questions were addressed. Maximal Sterile Barrier Technique was utilized including caps, mask, sterile gowns, sterile gloves, sterile drape, hand hygiene and skin antiseptic. A timeout was performed prior to the initiation of the procedure. The patient was positioned prone and non-contrast localization CT was performed of the pelvis to demonstrate the iliac marrow spaces. Maximal barrier sterile technique utilized including caps, mask, sterile gowns, sterile gloves, large sterile drape, hand hygiene, and betadine prep. Under sterile conditions and local anesthesia, an 11 gauge coaxial bone biopsy needle was advanced into the right iliac marrow space. Needle position was confirmed with CT imaging. Initially, bone marrow aspiration was performed. Next, the 11 gauge outer cannula was utilized to obtain a right iliac bone marrow core biopsy. Needle was removed. Hemostasis was  obtained with compression. The patient tolerated the procedure well. Samples were prepared with the cytotechnologist. IMPRESSION: Successful right iliac bone marrow aspiration and core biopsy. Electronically Signed   By: Heath  McCullough M.D.   On: 02/10/2022 15:18   IR KYPHO LUMBAR INC FX REDUCE BONE BX UNI/BIL CANNULATION INC/IMAGING  Result Date: 02/05/2022 INDICATION: Pathologic L3 vertebral body compression fracture, concern for multiple myeloma versus other hematologic malignancy EXAM: 1. L3 vertebral body biopsy using fluoroscopic guidance 2. L3 vertebral body radiofrequency ablation using OsteoCool 3. L3 vertebral body augmentation using balloon kyphoplasty COMPARISON:  MRI lumbar spine January 07, 2022 MEDICATIONS: As antibiotic prophylaxis, Ancef 2gm IV was ordered pre-procedure and administered intravenously within 1 hour of incision. All current medications are in the EMR and have been reviewed as part of this encounter. ANESTHESIA/SEDATION: Moderate (conscious) sedation was employed during this procedure. A total of Versed 5 mg and Fentanyl 200 mcg was administered intravenously by the radiology nurse. Total intra-service moderate Sedation Time: 64 minutes. The patient's level of consciousness and vital signs were monitored continuously by radiology nursing throughout the procedure under my direct supervision. FLUOROSCOPY: Radiation Exposure Index (as provided by the fluoroscopic device): 5.9 minutes (98 mGy) COMPLICATIONS: None immediate. PROCEDURE: Informed written consent was obtained from the patient after a thorough discussion of the procedural risks, benefits   and alternatives. All questions were addressed. Maximal Sterile Barrier Technique was utilized including caps, mask, sterile gowns, sterile gloves, sterile drape, hand hygiene and skin antiseptic. A timeout was performed prior to the initiation of the procedure. The patient was positioned prone on the exam table. A bipedicular access was  planned. Skin entry site(s) were marked overlying the L3 vertebral body using fluoroscopy. The overlying skin was then prepped and draped in the standard sterile fashion. Local analgesia was obtained with 1% lidocaine. Attention was first turned to the right side. Under fluoroscopic guidance, a 10 gauge introducer needle was advanced towards the lateral margin of the pedicle. Using multiple projections, the introducer needle was advanced towards the posterior margin of the vertebral body via a transpedicular approach. The posterior margin of the ablation zone was then marked using the inner needle of the introducer. The inner needle was then removed, and a core needle biopsy of the vertebral body was performed using a 10 gauge core biopsy device. A small amount of tissue was retrieved and sent to the lab for analysis. Next, the marking drill was advanced towards the anterior margin of the vertebral body for RF probe selection. The 20 mm RF ablation probe was determined to be appropriate. Attention was then turned to the contralateral side. Under fluoroscopic guidance, a 10 gauge introducer needle was advanced towards the lateral margin of the pedicle. Using multiple projections, the introducer needle was advanced towards the posterior margin of the vertebral body via a transpedicular approach. Again, the posterior margin of the ablation zone was then marked using the inner needle of the introducer. In a similar fashion, core needle biopsy of the vertebral body was performed using a 10 gauge core biopsy device. Small amount of tissue was again retrieved and sent to the lab for analysis. The marking drill was then advanced towards the anterior margin of the vertebral body for RF probe selection. The 20 mm RF ablation probe was determined to be appropriate. The selected RF ablation probes were then advanced through the bilateral transpedicular access needles and locked into position. Appropriate location within the  vertebral body was confirmed with multiple projections. RF ablation was then performed for 15 minutes at 70 degrees Celsius. The patient tolerated this portion of the procedure well without significant discomfort. At this time, the acrylic bone cement mixture was reconstituted in the Kyphon bone mixing device system. This was then loaded onto the Kyphon bone fillers. Upon completion of the RF ablation, the ablation probes were removed. The Kyphon 15 mm inflatable bone tamps were then advanced through the bilateral transpedicular access needles and positioned within the mid vertebral body. Kyphoplasty was then performed, ensuring that the balloon contours stayed within the vertebral body margins. The balloons were then deflated and removed, followed by advancement of the bone filler devices bilaterally and the instillation of acrylic bone cement with excellent filling in the AP and lateral projections. No extravasation was noted in the disk spaces or posteriorly into the spinal canal. No epidural venous contamination was seen. At the end of the procedure, the introducer cannulas and bone filler devices were then removed without difficulty. Clean dressings were placed after hemostasis. The patient tolerated all aspects of the procedure well, and was transferred to recovery in stable condition. IMPRESSION: 1. Successful core biopsy of the L3 vertebral body using fluoroscopic guidance. 2. Successful radiofrequency ablation (OsteoCool) of the L3 vertebral body. 3. Successful L3 vertebral body augmentation using balloon kyphoplasty. If the patient has known osteoporosis, recommend  treatment as clinically indicated. If the patient's bone density status is unknown, DEXA scan is recommended. Electronically Signed   By: Yasser  El-Abd M.D.   On: 02/05/2022 11:28   IR Bone Tumor(s)RF Ablation  Result Date: 02/05/2022 INDICATION: Pathologic L3 vertebral body compression fracture, concern for multiple myeloma versus other  hematologic malignancy EXAM: 1. L3 vertebral body biopsy using fluoroscopic guidance 2. L3 vertebral body radiofrequency ablation using OsteoCool 3. L3 vertebral body augmentation using balloon kyphoplasty COMPARISON:  MRI lumbar spine January 07, 2022 MEDICATIONS: As antibiotic prophylaxis, Ancef 2gm IV was ordered pre-procedure and administered intravenously within 1 hour of incision. All current medications are in the EMR and have been reviewed as part of this encounter. ANESTHESIA/SEDATION: Moderate (conscious) sedation was employed during this procedure. A total of Versed 5 mg and Fentanyl 200 mcg was administered intravenously by the radiology nurse. Total intra-service moderate Sedation Time: 64 minutes. The patient's level of consciousness and vital signs were monitored continuously by radiology nursing throughout the procedure under my direct supervision. FLUOROSCOPY: Radiation Exposure Index (as provided by the fluoroscopic device): 5.9 minutes (98 mGy) COMPLICATIONS: None immediate. PROCEDURE: Informed written consent was obtained from the patient after a thorough discussion of the procedural risks, benefits and alternatives. All questions were addressed. Maximal Sterile Barrier Technique was utilized including caps, mask, sterile gowns, sterile gloves, sterile drape, hand hygiene and skin antiseptic. A timeout was performed prior to the initiation of the procedure. The patient was positioned prone on the exam table. A bipedicular access was planned. Skin entry site(s) were marked overlying the L3 vertebral body using fluoroscopy. The overlying skin was then prepped and draped in the standard sterile fashion. Local analgesia was obtained with 1% lidocaine. Attention was first turned to the right side. Under fluoroscopic guidance, a 10 gauge introducer needle was advanced towards the lateral margin of the pedicle. Using multiple projections, the introducer needle was advanced towards the posterior margin of  the vertebral body via a transpedicular approach. The posterior margin of the ablation zone was then marked using the inner needle of the introducer. The inner needle was then removed, and a core needle biopsy of the vertebral body was performed using a 10 gauge core biopsy device. A small amount of tissue was retrieved and sent to the lab for analysis. Next, the marking drill was advanced towards the anterior margin of the vertebral body for RF probe selection. The 20 mm RF ablation probe was determined to be appropriate. Attention was then turned to the contralateral side. Under fluoroscopic guidance, a 10 gauge introducer needle was advanced towards the lateral margin of the pedicle. Using multiple projections, the introducer needle was advanced towards the posterior margin of the vertebral body via a transpedicular approach. Again, the posterior margin of the ablation zone was then marked using the inner needle of the introducer. In a similar fashion, core needle biopsy of the vertebral body was performed using a 10 gauge core biopsy device. Small amount of tissue was again retrieved and sent to the lab for analysis. The marking drill was then advanced towards the anterior margin of the vertebral body for RF probe selection. The 20 mm RF ablation probe was determined to be appropriate. The selected RF ablation probes were then advanced through the bilateral transpedicular access needles and locked into position. Appropriate location within the vertebral body was confirmed with multiple projections. RF ablation was then performed for 15 minutes at 70 degrees Celsius. The patient tolerated this portion of the procedure   well without significant discomfort. At this time, the acrylic bone cement mixture was reconstituted in the Kyphon bone mixing device system. This was then loaded onto the Kyphon bone fillers. Upon completion of the RF ablation, the ablation probes were removed. The Kyphon 15 mm inflatable bone  tamps were then advanced through the bilateral transpedicular access needles and positioned within the mid vertebral body. Kyphoplasty was then performed, ensuring that the balloon contours stayed within the vertebral body margins. The balloons were then deflated and removed, followed by advancement of the bone filler devices bilaterally and the instillation of acrylic bone cement with excellent filling in the AP and lateral projections. No extravasation was noted in the disk spaces or posteriorly into the spinal canal. No epidural venous contamination was seen. At the end of the procedure, the introducer cannulas and bone filler devices were then removed without difficulty. Clean dressings were placed after hemostasis. The patient tolerated all aspects of the procedure well, and was transferred to recovery in stable condition. IMPRESSION: 1. Successful core biopsy of the L3 vertebral body using fluoroscopic guidance. 2. Successful radiofrequency ablation (OsteoCool) of the L3 vertebral body. 3. Successful L3 vertebral body augmentation using balloon kyphoplasty. If the patient has known osteoporosis, recommend treatment as clinically indicated. If the patient's bone density status is unknown, DEXA scan is recommended. Electronically Signed   By: Yasser  El-Abd M.D.   On: 02/05/2022 11:28   DG Humerus Left  Result Date: 02/04/2022 CLINICAL DATA:  Acute left arm pain without known injury. EXAM: LEFT HUMERUS - 2+ VIEW COMPARISON:  None Available. FINDINGS: No definite fracture is noted. However, there is seen a large lucency or lytic lesion seen involving the proximal left humeral shaft concerning for metastatic disease or possibly myeloma. IMPRESSION: Probable large lytic lesion seen involving proximal left humeral shaft concerning for metastatic disease or possible myeloma. MRI is recommended for further evaluation. Electronically Signed   By: James  Green Jr M.D.   On: 02/04/2022 12:33   DG Shoulder  Left  Result Date: 02/04/2022 CLINICAL DATA:  Left shoulder pain without known injury. EXAM: LEFT SHOULDER - 2+ VIEW COMPARISON:  December 18, 2021. FINDINGS: There is no evidence of fracture or dislocation. Joint spaces are intact. However, lucency is seen involving the medullary portion of the proximal left humeral shaft scalping of the adjacent cortices, concerning for neoplasm or malignancy. Soft tissues are unremarkable. IMPRESSION: Large lucency is seen in the medullary portion of the proximal left humeral shaft with scalloping of the adjacent cortices concerning for destructive neoplasm or metastatic disease. Multiple myeloma is a consideration. MRI is recommended for further evaluation. Electronically Signed   By: James  Green Jr M.D.   On: 02/04/2022 12:32   DG Radiologist Eval And Mgmt  Result Date: 01/30/2022 EXAMINATION: New patient consultation HISTORY: HISTORY OF PRESENT ILLNESS: Puccio, Zelma J; 50 y/o -old; M who presents with painful subacute lumbar compression fracture. He developed severe lumbar pain several weeks ago. This developed without discrete accident or inciting factor. No previous episodes. He rates his pain 10/10 on the visual analog pain scale at its worst, improved to 7/10 with current prescribed pain medications. His pain is nonradiating. Pain is most acute when sitting and standing, somewhat relieved when sitting and supine. No new bowel or bladder control issues. No new lower extremity weakness or numbness. Medication: hydrochlorothiazide (HYDRODIURIL) 25 MG tablet Take 1 tablet (25 mg total) by mouth daily. ibuprofen (ADVIL) 600 MG tablet Take 1 tablet (600 mg total) by   mouth every 6 (six) hours as needed. lisinopril (ZESTRIL) 40 MG tablet Take 1 tablet (40 mg total) by mouth daily. oxyCODONE (OXY IR/ROXICODONE) 5 MG immediate release tablet Take 1-2 tablets (5-10 mg total) by mouth every 6 (six) hours as needed for severe pain. pantoprazole (PROTONIX) 40 MG tablet Take 1 tablet  (40 mg total) by mouth daily. rosuvastatin (CRESTOR) 10 MG tablet Take 1 tablet (10 mg total) by mouth daily. Allergies: None known Past Medical History: . Allergy . Atypical chest pain . GERD (gastroesophageal reflux disease) . Hepatic cyst Benign by MRI . Hiatal hernia . History of colonic polyps . Hyperlipidemia . Hypertension . Rectal bleeding . Tobacco abuse Surgical History: . COLONOSCOPY 01/10/2009 RMR: Normal rectum, normal colon; repeat in 2015 due to [place] of colon cancer . COLONOSCOPY N/A 01/09/2014 URK:[id number] colonic polyps-removed as described . COLONOSCOPY N/A 09/18/2016 Procedure: COLONOSCOPY; Surgeon: Daneil Dolin, MD; Location[facility]; Service: Endoscopy; Laterality: N/A; 730 . ESOPHAGOGASTRODUODENOSCOPY 10/04/2007 RMR: Distal esophageal erosions consistent with erosive reflux esophagitis, patulous gastroesophageal junction status post passage of a [place] dilator, 25 [place]. Otherwise, unremarkable esophagus. Hiatal hernia. Otherwise normal stomach. Bulbar erosion . ESOPHAGOGASTRODUODENOSCOPY N/A 01/09/2014 Erosive reflux esophagitis. Small hiatal hernia . I & D EXTREMITY Left 07/26/2018 Procedure: IRRIGATION AND DEBRIDEMENT EXTREMITY; Surgeon: Dayna Barker, MD; Location: [facility]; Service: Plastics; Laterality: Left; . PERCUTANEOUS PINNING Left 07/26/2018 Procedure: PERCUTANEOUS PINNING EXTREMITY; Surgeon: Dayna Barker, MD; Location: [place]; Service: Plastics; Laterality: Left; Family History: . Heart disease Mother . Hypertension Mother . Diabetes Father . Hypertension Father . Colon cancer Sister passed away from colon cancer, in her 45s . Heart attack Maternal Uncle . Prostate cancer Maternal Uncle . Diabetes Other . Pancreatic cancer Neg Hx . Rectal cancer Neg Hx . Esophageal cancer Neg Hx . Stomach cancer Neg Hx Social History: Socioeconomic History . Marital status: Single . Number of children: 2 Occupational History . 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 Smoking status: Every Day Packs/day: 0.25 Years: 10.00 Total pack years: 2.50 Types: Cigarettes . Alcohol use: Yes Alcohol/week: 0.0 standard drinks of alcohol Comment: occasional/wine on the weekends . Drug use: No . Sexual activity: Not on file Other Topics Concern Right handed Lives alone one story home Advance Care: The advanced care plan/surrogate decision maker was discussed at the time of visit and the patient did not wish to discuss or was not able to name a surrogate decision maker or provide an advance care plan. REVIEW OF SYSTEMS: Constitutional Symptoms: Patient denies weight change, fever or chills at this time. Eyes: None Ears, Nose, Mouth, Throat: No auditory complaints, no sore throat, no sinusitis. Cardiovascular/Vascular: No known heart problems. Denies chest pain or palpitations. Respiratory: Patient denies cough or shortness of breath. GI: Patient denies abdominal pain, nausea, vomiting, diarrhea, bloody stools or constipation. GU: No change. Musculoskeletal: As above Integumentary (Skin): Patient denies rashes, ulcerations or skin color changes. Neuro: Patient denies decreased sensation, seizures or weakness. Psychological: None Hematological/Lymphatic: Patient denies easy bruising or bleeding. Allergy/Immune: None PHYSICAL EXAMINATION: Fall Risk Assessment: Ambulatory, displays no signs for fall risk using the CDC TUG assessment. Vital Signs: BP 133/83 (BP Location: Right Arm, Patient Position: Sitting, Cuff Size: Normal)  Pulse 83  Temp 98.6 F (37 C) (Oral)  Wt 111.6 kg  SpO2 97% Comment: room air  BMI 36.33 kg/m HENT: Head: Normocephalic and atraumatic. Eyes: Conjunctivae and EOM are normal. Right eye exhibits no discharge. Left eye exhibits no discharge.  No scleral icterus. Neck: No JVD present. Pulmonary/Chest: Effort normal. No stridor. No respiratory distress. Abdomen: soft, non distended Neurological:  alert and oriented to person, place, and  time. Skin: Skin is warm and dry.  not diaphoretic. Psychiatric: normal mood and affect. behavior is normal. Judgment and thought content normal. BMI: 36.33. For patients, who provided height and weight, and are 18 or older with a BMI of 25.0 - 29.9 (overweight) or 30.0 or over (obesity), it is recommended the patient receive counseling regarding diet, nutrition and activity. IMAGING FINDINGS: MRI LUMBAR SPINE WITHOUT AND WITH CONTRAST 01/07/2022 IMPRESSION: 1. Diffuse heterogeneous marrow signal with heterogeneous enhancement throughout the thoracolumbar spine. The heterogeneous marrow is new compared with an MRI of the abdomen performed 05/27/2016. Overall appearance is concerning for an infiltrative marrow process such as myeloma or leukemia until proven otherwise. Correlate with laboratory values. 2. Subacute-chronic L3 vertebral body compression fracture with approximately 10% height loss. 3. Mild lumbar spine spondylosis as described above. ASSESSMENT: Nicholson, Dianne J; 51 y/o -old; M seen in clinic. Patient has subacute lumbar L3 compression fracture deformity which likely contributes or accounts for most of the low back pain. Based on cross-sectional imaging, this would be anatomically approachable for percutaneous intervention. No associated spinal stenosis or other contraindications. Marrow heterogeneity suggesting infiltrative process resulting in pathologic fracture, therefore core biopsy sample could be obtained at the time of procedure for further characterization. Given the lack of adequate symptom relief with time and a fairly aggressive pain medication regimen, and limitations of activities of daily living, the patient is clinically an appropriate candidate for consideration of vertebral augmentation. I discussed with the patient the pathophysiology of vertebral compression fracture deformities; the stable nature of these which does not require emergent treatment; natural history which includes  healing over some unpredictable number of months. We discussed treatment options including watchful waiting, surgical fixation, and percutaneous kyphoplasty/vertebroplasty. We discussed in detail the percutaneous kyphoplasty technique, anticipated benefits, time course to symptom resolution, possible risks and side effects. We discussed his elevated risk of additional level fractures with or without vertebral augmentation. We discussed the long-term need for continued bone building therapy managed by the patient's PCP. He is also scheduled for bone marrow biopsy due to blood work abnormality suggesting possible plasma cell neoplasm. The patient seemed to understand, and did ask appropriate questions. RECOMMENDATIONS: The patient is motivated to proceed with treatment ASAP. Accordingly, we schedule percutaneous lumbar L3 percutaneous bone biopsy, OsteoCool RF ablation, and kyphoplasty under moderate sedation as an outpatient at the patient's convenience, pending carrier approval if needed. SUMMARY: Approximately 30 minutes were spent with the patient, of which 20 minutes I spent discussing pathologic subacute painful L3 compression fracture treatment options. The patient has suffered a fracture of the lumbar L3 vertebral body, probably pathologic. It is recommended that patients aged 52 years or older be evaluated for possible testing or treatment of osteoporosis. A copy of this consult report is sent to the patient's referring physician. Electronically Signed   By: Lucrezia Europe M.D.   On: 01/30/2022 08:48    Micro Results    Recent Results (from the past 240 hour(s))  Urine Culture     Status: Abnormal   Collection Time: 02/20/22  6:12 AM   Specimen: Urine, Clean Catch  Result Value Ref Range Status   Specimen Description URINE, CLEAN CATCH  Final   Special Requests   Final    NONE Performed at [facility], [place],  [place] [address]    Culture MULTIPLE SPECIES PRESENT, SUGGEST  RECOLLECTION (A)  Final   Report Status 02/21/2022 FINAL  Final    Today   Subjective    Afton Desmith today has no headache,no chest abdominal pain,no new weakness tingling or numbness, feels much better wants to go home today.    Objective   Blood pressure 134/93, pulse 90, temperature 98.7 F (37.1 C), temperature source Oral, resp. rate 18, height 5' 9" (1.753 m), weight 111.9 kg, SpO2 98 %.   Intake/Output Summary (Last 24 hours) at 02/24/2022 0846 Last data filed at 02/23/2022 1637 Gross per 24 hour  Intake --  Output 400 ml  Net -400 ml    Exam  Awake Alert, No new F.N deficits,    [place].AT,PERRAL Supple Neck,   Symmetrical Chest wall movement, Good air movement bilaterally, CTAB RRR,No Gallops,   +ve B.Sounds, Abd Soft, Non tender,   Left arm in sling with postop dressing on the left shoulder, low back pain is better   Data Review   Recent Labs  Lab 02/19/22 0745 02/20/22 0453 02/21/22 0643 02/22/22 0640 02/23/22 0540  WBC 7.6 6.3 6.0 5.6 8.1  HGB 10.9* 11.0* 10.9* 10.8* 10.2*  HCT 32.5* 33.5* 32.8* 32.4* 30.7*  PLT 213 215 204 203 210  MCV 88.6 89.8 88.6 89.8 89.0  MCH 29.7 29.5 29.5 29.9 29.6  MCHC 33.5 32.8 33.2 33.3 33.2  RDW 13.0 13.0 12.8 12.6 12.6  LYMPHSABS 2.8 1.7  --  2.6 2.8  MONOABS 0.5 0.4  --  0.5 0.7  EOSABS 0.1 0.1  --  0.1 0.0  BASOSABS 0.0 0.0  --  0.0 0.0    Recent Labs  Lab 02/19/22 0745 02/20/22 0453 02/20/22 0623 02/21/22 0542 02/21/22 0643 02/22/22 0640 02/23/22 0540 02/24/22 0318  NA 128* 133*  --  132*  --  133* 131* 131*  K 4.1 4.2  --  4.2  --  4.1 3.8 4.3  CL 98 95*  --  100  --  100 100 96*  CO2 24 26  --  25  --  26 24 25  GLUCOSE 93 99  --  100*  --  101* 116* 111*  BUN 13 13  --  17  --  18 23* 14  CREATININE 1.00 1.08  --  1.03  --  1.07 1.19 1.07  CALCIUM 8.8* 10.5*  --  9.4  --  9.9 9.4 9.9  AST 22 28  --   --   --   --   --   --   ALT 30 29  --   --   --   --   --   --   ALKPHOS 46 54  --   --   --    --   --   --   BILITOT 0.8 0.7  --   --   --   --   --   --   ALBUMIN 2.6* 2.7*  --   --   --   --   --   --   MG 1.8 2.0  --   --  2.2 2.1 2.0  --   PROCALCITON  --   --  0.18  --   --   --   --   --   BNP 25.8 22.9  --   --   --   --   --   --     Total Time   in preparing paper work, data evaluation and todays exam - 35 minutes  Prashant Singh M.D on 02/24/2022 at 8:46 AM  [facility]     

## 2022-02-25 ENCOUNTER — Encounter: Payer: Self-pay | Admitting: Hematology and Oncology

## 2022-02-25 ENCOUNTER — Telehealth: Payer: Self-pay

## 2022-02-25 MED ORDER — LENALIDOMIDE 25 MG PO CAPS: 25.0000 mg | ORAL_CAPSULE | Freq: Every day | ORAL | 0 refills | Status: DC

## 2022-02-25 NOTE — Telephone Encounter (Signed)
Oral Chemotherapy Pharmacist Encounter  I spoke with patient for overview of: Revlimid for the treatment of multiple myeloma in conjunction with Velcade and dexamethasone, planned duration until disease progression or unacceptable drug toxicity.  Counseled patient on administration, dosing, side effects, monitoring, drug-food interactions, safe handling, storage, and disposal.  Patient will take Revlimid $RemoveBefor'25mg'[id number]$  capsules, 1 capsule by mouth once daily, without regard to food, with a full glass of water.  Revlimid will be given 14 days on, 7 days off, repeat every 21 days.  Revlimid start date: tentatively 02/26/22 PM - patient knows to get OK at office visit on 10/4 prior to start.   Adverse effects of Revlimid include but are not limited to: nausea, constipation, diarrhea, abdominal pain, rash, fatigue, and decreased blood counts. MD notified about need for acyclovir prescription.  Patient counseled on importance of daily aspirin $RemoveBefor'81mg'[id number]$  for VTE prophylaxis.    Reviewed with patient importance of keeping a medication schedule and plan for any missed doses. No barriers to medication adherence identified.  Medication reconciliation performed and medication/allergy list updated.  Insurance authorization for Revlimid has been obtained.  Revlimid prescription is being dispensed from [facility] as it is a limited distribution medication.  All questions answered.  [name] voiced understanding and appreciation.   Medication education handout placed in mail for patient. Patient knows to call the office with questions or concerns. Oral Chemotherapy Clinic phone number provided to patient.   Leron Croak, PharmD, BCPS, BCOP Hematology/Oncology Clinical Pharmacist [facility] [phone] 02/25/2022 4:18 PM

## 2022-02-25 NOTE — Progress Notes (Signed)
Pt returned my call so I went over what the grant covers and gave him the income requirement.  He would like to apply so he will bring his proof of income on 02/26/22.  If approved I will give him an expense sheet and my card for any questions or concerns he may have in the future.

## 2022-02-25 NOTE — Progress Notes (Signed)
Called pt to introduce myself as his Arboriculturist and to discuss the [employer].  He was at the dentist so he couldn't talk at the moment so he's going to call me back to discuss the grant further.

## 2022-02-25 NOTE — Telephone Encounter (Signed)
Transition Care Management Unsuccessful Follow-up Telephone Call  Date of discharge and from where:  02/24/2022, [facility]  Attempts:  1st Attempt  Reason for unsuccessful TCM follow-up call:  Left voice message on [phone], call back requested

## 2022-02-26 ENCOUNTER — Other Ambulatory Visit: Payer: Self-pay

## 2022-02-26 ENCOUNTER — Telehealth: Payer: Self-pay

## 2022-02-26 ENCOUNTER — Encounter: Payer: Self-pay | Admitting: *Deleted

## 2022-02-26 ENCOUNTER — Inpatient Hospital Stay: Payer: BC Managed Care – PPO

## 2022-02-26 ENCOUNTER — Encounter: Payer: Self-pay | Admitting: Hematology and Oncology

## 2022-02-26 ENCOUNTER — Inpatient Hospital Stay (HOSPITAL_BASED_OUTPATIENT_CLINIC_OR_DEPARTMENT_OTHER): Payer: BC Managed Care – PPO | Admitting: Physician Assistant

## 2022-02-26 VITALS — BP 134/79 | HR 79 | Resp 18 | Ht 69.0 in | Wt 241.7 lb

## 2022-02-26 DIAGNOSIS — F17210 Nicotine dependence, cigarettes, uncomplicated: Secondary | ICD-10-CM | POA: Diagnosis not present

## 2022-02-26 DIAGNOSIS — M4316 Spondylolisthesis, lumbar region: Secondary | ICD-10-CM | POA: Diagnosis not present

## 2022-02-26 DIAGNOSIS — C9000 Multiple myeloma not having achieved remission: Secondary | ICD-10-CM

## 2022-02-26 DIAGNOSIS — Z7982 Long term (current) use of aspirin: Secondary | ICD-10-CM | POA: Diagnosis not present

## 2022-02-26 DIAGNOSIS — Z8719 Personal history of other diseases of the digestive system: Secondary | ICD-10-CM | POA: Diagnosis not present

## 2022-02-26 DIAGNOSIS — M47816 Spondylosis without myelopathy or radiculopathy, lumbar region: Secondary | ICD-10-CM | POA: Diagnosis not present

## 2022-02-26 DIAGNOSIS — Z7961 Long term (current) use of immunomodulator: Secondary | ICD-10-CM | POA: Diagnosis not present

## 2022-02-26 DIAGNOSIS — Z800 Family history of malignant neoplasm of digestive organs: Secondary | ICD-10-CM | POA: Diagnosis not present

## 2022-02-26 DIAGNOSIS — M4856XA Collapsed vertebra, not elsewhere classified, lumbar region, initial encounter for fracture: Secondary | ICD-10-CM | POA: Diagnosis not present

## 2022-02-26 DIAGNOSIS — Z5112 Encounter for antineoplastic immunotherapy: Secondary | ICD-10-CM | POA: Diagnosis not present

## 2022-02-26 DIAGNOSIS — K5900 Constipation, unspecified: Secondary | ICD-10-CM | POA: Diagnosis not present

## 2022-02-26 DIAGNOSIS — E785 Hyperlipidemia, unspecified: Secondary | ICD-10-CM | POA: Diagnosis not present

## 2022-02-26 DIAGNOSIS — I10 Essential (primary) hypertension: Secondary | ICD-10-CM | POA: Diagnosis not present

## 2022-02-26 DIAGNOSIS — M4802 Spinal stenosis, cervical region: Secondary | ICD-10-CM | POA: Diagnosis not present

## 2022-02-26 DIAGNOSIS — K449 Diaphragmatic hernia without obstruction or gangrene: Secondary | ICD-10-CM | POA: Diagnosis not present

## 2022-02-26 DIAGNOSIS — Z79899 Other long term (current) drug therapy: Secondary | ICD-10-CM | POA: Diagnosis not present

## 2022-02-26 LAB — CBC WITH DIFFERENTIAL (CANCER CENTER ONLY)
Abs Immature Granulocytes: 0.11 10*3/uL — ABNORMAL HIGH (ref 0.00–0.07)
Basophils Absolute: 0 10*3/uL (ref 0.0–0.1)
Basophils Relative: 0 %
Eosinophils Absolute: 0.1 10*3/uL (ref 0.0–0.5)
Eosinophils Relative: 1 %
HCT: 32.6 % — ABNORMAL LOW (ref 39.0–52.0)
Hemoglobin: 10.7 g/dL — ABNORMAL LOW (ref 13.0–17.0)
Immature Granulocytes: 1 %
Lymphocytes Relative: 34 %
Lymphs Abs: 3 10*3/uL (ref 0.7–4.0)
MCH: 29.7 pg (ref 26.0–34.0)
MCHC: 32.8 g/dL (ref 30.0–36.0)
MCV: 90.6 fL (ref 80.0–100.0)
Monocytes Absolute: 0.6 10*3/uL (ref 0.1–1.0)
Monocytes Relative: 6 %
Neutro Abs: 5.2 10*3/uL (ref 1.7–7.7)
Neutrophils Relative %: 58 %
Platelet Count: 242 10*3/uL (ref 150–400)
RBC: 3.6 MIL/uL — ABNORMAL LOW (ref 4.22–5.81)
RDW: 12.6 % (ref 11.5–15.5)
WBC Count: 9 10*3/uL (ref 4.0–10.5)
nRBC: 0 % (ref 0.0–0.2)

## 2022-02-26 LAB — CMP (CANCER CENTER ONLY)
ALT: 21 U/L (ref 0–44)
AST: 23 U/L (ref 15–41)
Albumin: 3.4 g/dL — ABNORMAL LOW (ref 3.5–5.0)
Alkaline Phosphatase: 58 U/L (ref 38–126)
Anion gap: 6 (ref 5–15)
BUN: 21 mg/dL — ABNORMAL HIGH (ref 6–20)
CO2: 27 mmol/L (ref 22–32)
Calcium: 10.6 mg/dL — ABNORMAL HIGH (ref 8.9–10.3)
Chloride: 97 mmol/L — ABNORMAL LOW (ref 98–111)
Creatinine: 1.18 mg/dL (ref 0.61–1.24)
GFR, Estimated: >60 mL/min (ref >60)
Glucose, Bld: 115 mg/dL — ABNORMAL HIGH (ref 70–99)
Potassium: 4.3 mmol/L (ref 3.5–5.1)
Sodium: 130 mmol/L — ABNORMAL LOW (ref 135–145)
Total Bilirubin: 0.5 mg/dL (ref 0.3–1.2)
Total Protein: 11.2 g/dL — ABNORMAL HIGH (ref 6.5–8.1)

## 2022-02-26 MED ORDER — ALLOPURINOL 300 MG PO TABS: 300.0000 mg | ORAL_TABLET | Freq: Every day | ORAL | 3 refills | Status: DC

## 2022-02-26 MED ORDER — DEXAMETHASONE 4 MG PO TABS
40.0000 mg | ORAL_TABLET | Freq: Once | ORAL | Status: AC
  Administered 2022-02-26: 40 mg via ORAL
  Filled 2022-02-26: qty 10

## 2022-02-26 MED ORDER — BORTEZOMIB CHEMO SQ INJECTION 3.5 MG (2.5MG/ML)
1.3000 mg/m2 | Freq: Once | INTRAMUSCULAR | Status: AC
  Administered 2022-02-26: 3 mg via SUBCUTANEOUS
  Filled 2022-02-26: qty 1.2

## 2022-02-26 MED ORDER — PROCHLORPERAZINE MALEATE 10 MG PO TABS: 10.0000 mg | ORAL_TABLET | Freq: Four times a day (QID) | ORAL | 0 refills | Status: DC | PRN

## 2022-02-26 MED ORDER — ONDANSETRON HCL 8 MG PO TABS: 8.0000 mg | ORAL_TABLET | Freq: Three times a day (TID) | ORAL | 0 refills | Status: DC | PRN

## 2022-02-26 MED ORDER — ACYCLOVIR 400 MG PO TABS: 400.0000 mg | ORAL_TABLET | Freq: Two times a day (BID) | ORAL | 3 refills | Status: DC

## 2022-02-26 NOTE — Progress Notes (Signed)
Pt is approved for the $1000 [employer] grant.  

## 2022-02-26 NOTE — Telephone Encounter (Signed)
Transition Care Management Unsuccessful Follow-up Telephone Call  Date of discharge and from where:  02/24/2022, [facility]  Attempts:  2nd Attempt  Reason for unsuccessful TCM follow-up call:  Left voice message  on [phone], call back requested

## 2022-02-26 NOTE — Patient Instructions (Signed)
[facility]  Discharge Instructions: Thank you for choosing [facility] to provide your oncology and hematology care.   If you have a lab appointment with the [facility], please go directly to the [facility] and check in at the registration area.   Wear comfortable clothing and clothing appropriate for easy access to any Portacath or PICC line.   We strive to give you quality time with your provider. You may need to reschedule your appointment if you arrive late (15 or more minutes).  Arriving late affects you and other patients whose appointments are after yours.  Also, if you miss three or more appointments without notifying the office, you may be dismissed from the clinic at the provider's discretion.      For prescription refill requests, have your pharmacy contact our office and allow 72 hours for refills to be completed.    Today you received the following chemotherapy and/or immunotherapy agents : Bortezomib.      To help prevent nausea and vomiting after your treatment, we encourage you to take your nausea medication as directed.  BELOW ARE SYMPTOMS THAT SHOULD BE REPORTED IMMEDIATELY: *FEVER GREATER THAN 100.4 F (38 C) OR HIGHER *CHILLS OR SWEATING *NAUSEA AND VOMITING THAT IS NOT CONTROLLED WITH YOUR NAUSEA MEDICATION *UNUSUAL SHORTNESS OF BREATH *UNUSUAL BRUISING OR BLEEDING *URINARY PROBLEMS (pain or burning when urinating, or frequent urination) *BOWEL PROBLEMS (unusual diarrhea, constipation, pain near the anus) TENDERNESS IN MOUTH AND THROAT WITH OR WITHOUT PRESENCE OF ULCERS (sore throat, sores in mouth, or a toothache) UNUSUAL RASH, SWELLING OR PAIN  UNUSUAL VAGINAL DISCHARGE OR ITCHING   Items with * indicate a potential emergency and should be followed up as soon as possible or go to the Emergency Department if any problems should occur.  Please show the CHEMOTHERAPY ALERT CARD or IMMUNOTHERAPY ALERT CARD at check-in  to the Emergency Department and triage nurse.  Should you have questions after your visit or need to cancel or reschedule your appointment, please contact [facility]  Dept: [phone]  and follow the prompts.  Office hours are 8:00 a.m. to 4:30 p.m. Monday - Friday. Please note that voicemails left after 4:00 p.m. may not be returned until the following business day.  We are closed weekends and major holidays. You have access to a nurse at all times for urgent questions. Please call the main number to the clinic Dept: [phone] and follow the prompts.   For any non-urgent questions, you may also contact your provider using MyChart. We now offer e-Visits for anyone 83 and older to request care online for non-urgent symptoms. For details visit [email or web address].   Also download the MyChart app! Go to the app store, search "MyChart", open the app, select [facility], and log in with your MyChart username and password.  Masks are optional in the cancer centers. If you would like for your care team to wear a mask while they are taking care of you, please let them know. You may have one support person who is at least 53 years old accompany you for your appointments.

## 2022-02-26 NOTE — Progress Notes (Signed)
[place] [phone]   [phone]  PROGRESS NOTE  Patient Care Team: [name], MD as PCP - General (Family Medicine) Lorretta Harp, MD as PCP - Cardiology (Cardiology) Gala Romney Cristopher Estimable, MD as Attending Physician (Gastroenterology)  Hematological/Oncological History # IgG Kappa Multiple Myeloma, 1p32/1q21.  01/04/2022: MRI lumbar spine shows diffuse heterogeneous marrow signal with heterogeneous enhancement throughout the thoracolumbar spine.  Additionally, there is subacute-chronic L3 vertebral body compression fraction. 01/14/2022: establish care with Dr. Lorenso Courier  02/10/2022: bone marrow biopsy shows range of plasma cell involvement from 50% on the biopsy to 60% on aspirate smear slides and close to 90% on the clot section for an overall percentage estimated at approximately 80% overall. 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  02/26/2022: Cycle 1 of VRD therapy  Interval History:  [name] 51 y.o. male with medical history significant for newly diagnosed IgG kappa multiple myeloma who presents for a follow up visit. The patient's last visit was on 02/13/2022 at which time he established care. He presents today to start Cycle 1 of VRD therapy.   On exam today Mr. Thon reports his back pain is improving and he is consistent with taking his pain medication which includes MS Contin 60 mg every 12 hours and oxycodone 15 mg every 8 hours.  Patient still has some residual back pain and inquired about any modifications to his pain regimen.  His energy levels are improving and he is more ambulatory.  He has a good appetite and denies any weight changes.  He denies nausea, vomiting or abdominal pain.  He is having some constipation and is using MiraLAX as needed.  He denies easy bruising or signs of active bleeding.  He denies fevers, chills, sweats, shortness of breath, chest pain or cough. He has no other  complaints. A full 10 point ROS was otherwise negative.  MEDICAL HISTORY:  Past Medical History:  Diagnosis Date   Allergy    Anemia    Atypical chest pain    Cancer (HCC)    Multiple myeloma with normocytic anemia   GERD (gastroesophageal reflux disease)    Hepatic cyst    Benign by MRI   Hiatal hernia    History of colonic polyps    Hyperlipidemia    Hypertension    Rectal bleeding    Tobacco abuse     SURGICAL HISTORY: Past Surgical History:  Procedure Laterality Date   COLONOSCOPY  01/10/2009     RMR: Normal rectum, normal colon; repeat in 2015 due to [place] of colon cancer   COLONOSCOPY N/A 01/09/2014   IBB:[id number] colonic polyps-removed as described   COLONOSCOPY N/A 09/18/2016   Procedure: COLONOSCOPY;  Surgeon: Daneil Dolin, MD;  [facility]: [facility];  Service: Endoscopy;  Laterality: N/A;  730    ESOPHAGOGASTRODUODENOSCOPY  10/04/2007   RMR: Distal esophageal erosions consistent with erosive reflux esophagitis, patulous gastroesophageal junction status post passage of a  Maloney dilator, 86 [place].  Otherwise, unremarkable esophagus.  Hiatal hernia.  Otherwise normal stomach.  Bulbar erosion   ESOPHAGOGASTRODUODENOSCOPY N/A 01/09/2014   Erosive reflux esophagitis. Small hiatal hernia   HUMERUS IM NAIL Left 02/22/2022   Procedure: INTRAMEDULLARY (IM) NAIL HUMERAL;  Surgeon: Nicholes Stairs, MD;  Location: [place];  Service: Orthopedics;  Laterality: Left;   I & D EXTREMITY Left 07/26/2018   Procedure: IRRIGATION AND DEBRIDEMENT EXTREMITY;  Surgeon: Dayna Barker, MD;  Location: [place];  Service: Plastics;  Laterality: Left;  IR BONE TUMOR(S)RF ABLATION  02/05/2022   IR BONE TUMOR(S)RF ABLATION  02/24/2022   IR KYPHO EA ADDL LEVEL THORACIC OR LUMBAR  02/19/2022   IR KYPHO LUMBAR INC FX REDUCE BONE BX UNI/BIL CANNULATION INC/IMAGING  02/05/2022   IR KYPHO LUMBAR INC FX REDUCE BONE BX UNI/BIL CANNULATION INC/IMAGING  02/19/2022   PERCUTANEOUS PINNING Left 07/26/2018    Procedure: PERCUTANEOUS PINNING EXTREMITY;  Surgeon: Dayna Barker, MD;  Location: [place];  Service: Plastics;  Laterality: Left;    SOCIAL HISTORY: Social History   Socioeconomic History   Marital status: Single    Spouse name: Not on file   Number of children: 2   Years of education: Not on file   Highest education level: Not on file  Occupational History   Occupation: Scientist, 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[place] Use   Smoking status: Every Day    Packs/day: 0.25    Years: 10.00    Total pack years: 2.50    Types: Cigarettes   Smokeless tobacco: Never  Vaping Use   Vaping Use: Never used  Substance and Sexual Activity   Alcohol use: Yes    Alcohol/week: 0.0 standard drinks of alcohol    Comment: occasional/wine on the weekends   Drug use: No   Sexual activity: Not on file  Other Topics Concern   Not on file  Social History Narrative   Right handed   Lives alone one story home   Social Determinants of Health   Financial Resource Strain: Not on file  Food Insecurity: No Food Insecurity (02/18/2022)   Hunger Vital Sign    Worried About Running Out of Food in the Last Year: Never true    Ran Out of Food in the Last Year: Never true  Transportation Needs: No Transportation Needs (02/18/2022)   PRAPARE - Hydrologist (Medical): No    Lack of Transportation (Non-Medical): No  Physical Activity: Not on file  Stress: Not on file  Social Connections: Not on file  Intimate Partner Violence: Not At Risk (02/18/2022)   Humiliation, Afraid, Rape, and Kick questionnaire    Fear of Current or Ex-Partner: No    Emotionally Abused: No    Physically Abused: No    Sexually Abused: No    FAMILY HISTORY: Family History  Problem Relation Age of Onset   Heart disease Mother    Hypertension Mother    Diabetes Father    Hypertension Father    Colon cancer Sister        passed away from colon cancer,  in her 28s   Heart attack Maternal Uncle    Prostate cancer Maternal Uncle    Diabetes Other    Pancreatic cancer Neg Hx    Rectal cancer Neg Hx    Esophageal cancer Neg Hx    Stomach cancer Neg Hx     ALLERGIES:  has No Known Allergies.  MEDICATIONS:  Current Outpatient Medications  Medication Sig Dispense Refill   acetaminophen (TYLENOL) 500 MG tablet Take 1 tablet (500 mg total) by mouth every 8 (eight) hours as needed for mild pain or moderate pain. 30 tablet 0   aspirin EC 81 MG tablet Take 81 mg by mouth daily. Swallow whole.     docusate sodium (COLACE) 100 MG capsule Take 1 capsule (100 mg total) by mouth 2 (two) times daily as needed for mild constipation. 25 capsule 0  hydrochlorothiazide (HYDRODIURIL) 25 MG tablet Take 1 tablet (25 mg total) by mouth daily. 90 tablet 1   ibuprofen (ADVIL) 600 MG tablet Take 1 tablet (600 mg total) by mouth every 6 (six) hours as needed. (Patient taking differently: Take 600 mg by mouth every 6 (six) hours as needed for mild pain or moderate pain.) 30 tablet 0   lenalidomide (REVLIMID) 25 MG capsule Take 1 capsule (25 mg total) by mouth daily. Take for 14 days and then none for 7 days. Repeat every 21 days. [employer] Auth # [id number] Date Obtained 02/13/22 14 capsule 0   lisinopril (ZESTRIL) 40 MG tablet Take 1 tablet (40 mg total) by mouth daily. 90 tablet 1   metoprolol tartrate (LOPRESSOR) 50 MG tablet Take 1 tablet (50 mg total) by mouth 2 (two) times daily. 60 tablet 0   morphine (MS CONTIN) 30 MG 12 hr tablet Take 2 tablets (60 mg total) by mouth every 12 (twelve) hours. 8 tablet 0   oxyCODONE (ROXICODONE) 15 MG immediate release tablet Take 1 tablet (15 mg total) by mouth every 8 (eight) hours as needed for severe pain or breakthrough pain. 20 tablet 0   pantoprazole (PROTONIX) 40 MG tablet Take 1 tablet (40 mg total) by mouth daily. 90 tablet 1   polyethylene glycol powder (GLYCOLAX/MIRALAX) 17 GM/SCOOP powder Take 1 capful (17 g) with water  by mouth daily. 238 g 0   No current facility-administered medications for this visit.   Facility-Administered Medications Ordered in Other Visits  Medication Dose Route Frequency Provider Last Rate Last Admin   bortezomib SQ (VELCADE) chemo injection (2.45m/mL concentration) 3 mg  1.3 mg/m2 (Treatment Plan Recorded) Subcutaneous Once DOrson Slick MD       dexamethasone (DECADRON) tablet 40 mg  40 mg Oral Once DOrson Slick MD        REVIEW OF SYSTEMS:   Constitutional: ( - ) fevers, ( - )  chills , ( - ) night sweats Eyes: ( - ) blurriness of vision, ( - ) double vision, ( - ) watery eyes Ears, nose, mouth, throat, and face: ( - ) mucositis, ( - ) sore throat Respiratory: ( - ) cough, ( - ) dyspnea, ( - ) wheezes Cardiovascular: ( - ) palpitation, ( - ) chest discomfort, ( - ) lower extremity swelling Gastrointestinal:  ( - ) nausea, ( - ) heartburn, ( - ) change in bowel habits Skin: ( - ) abnormal skin rashes Lymphatics: ( - ) new lymphadenopathy, ( - ) easy bruising Neurological: ( - ) numbness, ( - ) tingling, ( - ) new weaknesses Behavioral/Psych: ( - ) mood change, ( - ) new changes  All other systems were reviewed with the patient and are negative.  PHYSICAL EXAMINATION: ECOG PERFORMANCE STATUS: 1 - Symptomatic but completely ambulatory  Vitals:   02/26/22 1011  BP: 134/79  Pulse: 79  Resp: 18  SpO2: 100%   Filed Weights   02/26/22 1011  Weight: 241 lb 11.2 oz (109.6 kg)    GENERAL: Well-appearing young African-American male, alert, no distress and comfortable SKIN: skin color, texture, turgor are normal, no rashes or significant lesions EYES: conjunctiva are pink and non-injected, sclera clear LUNGS: clear to auscultation and percussion with normal breathing effort HEART: regular rate & rhythm and no murmurs and no lower extremity edema Musculoskeletal: no cyanosis of digits and no clubbing  PSYCH: alert & oriented x 3, fluent speech NEURO: no focal  motor/sensory deficits  LABORATORY DATA:  I have reviewed the data as listed    Latest Ref Rng & Units 02/26/2022    9:44 AM 02/23/2022    5:40 AM 02/22/2022    6:40 AM  CBC  WBC 4.0 - 10.5 K/uL 9.0  8.1  5.6   Hemoglobin 13.0 - 17.0 g/dL 10.7  10.2  10.8   Hematocrit 39.0 - 52.0 % 32.6  30.7  32.4   Platelets 150 - 400 K/uL 242  210  203        Latest Ref Rng & Units 02/26/2022    9:44 AM 02/24/2022    3:18 AM 02/23/2022    5:40 AM  CMP  Glucose 70 - 99 mg/dL 115  111  116   BUN 6 - 20 mg/dL _0 Creatinine 0.61 - 1.24 mg/dL 1.18  1.07  1.19   Sodium 135 - 145 mmol/L 130  131  131   Potassium 3.5 - 5.1 mmol/L 4.3  4.3  3.8   Chloride 98 - 111 mmol/L 97  96  100   CO2 22 - 32 mmol/L _1 Calcium 8.9 - 10.3 mg/dL 10.6  9.9  9.4   Total Protein 6.5 - 8.1 g/dL 11.2     Total Bilirubin 0.3 - 1.2 mg/dL 0.5     Alkaline Phos 38 - 126 U/L 58     AST 15 - 41 U/L 23     ALT 0 - 44 U/L 21       Lab Results  Component Value Date   MPROTEIN 3.1 (H) 01/14/2022   Lab Results  Component Value Date   KPAFRELGTCHN 92.0 (H) 01/14/2022   LAMBDASER 9.4 01/14/2022   KAPLAMBRATIO 57.71 (H) 01/17/2022   KAPLAMBRATIO 9.79 (H) 01/14/2022   RADIOGRAPHIC STUDIES: IR KYPHO LUMBAR INC FX REDUCE BONE BX UNI/BIL CANNULATION INC/IMAGING  Result Date: 02/24/2022 INDICATION: 51 year old male with known multiple myeloma and acute symptomatic pathologic fractures of the L1 and L2 vertebral bodies. EXAM: FLUOROSCOPY GUIDED L1 AND L2 RADIOFREQUENCY ABLATION AND BALLOON KYPHOPLASTY COMPARISON:  CT lumbar spine February 17, 2022. MEDICATIONS: As antibiotic prophylaxis, Ancef 2 g IV was ordered pre-procedure and administered intravenously within 1 hour of incision. All current medications are in the EMR and have been reviewed as part of this encounter. ANESTHESIA/SEDATION: Moderate (conscious) sedation was employed during this procedure. A total of Versed 6 mg, Dilaudid 1 mg and Fentanyl 200 mcg  were administered intravenously by the radiology nurse. Total intra-service moderate Sedation Time: 1 hour and 47 minutes. The patient's level of consciousness and vital signs were monitored continuously by radiology nursing throughout the procedure under my direct supervision. FLUOROSCOPY: Radiation Exposure Index (as provided by the fluoroscopic device): 4,696 mGy Kerma COMPLICATIONS: None immediate. PROCEDURE: Following a full explanation of the procedure along with the potential associated complications, an informed witnessed consent was obtained. The patient was placed in prone position on the angiography table. The lumbar region was prepped and draped in a sterile fashion. Under fluoroscopy, the L1 vertebral body was delineated and the skin area was marked. The skin was infiltrated with a 1% Lidocaine approximately 3.5 cm lateral to the spinous process projection on the right. Using a 22-gauge spinal needle, the soft issue and the peripedicular space and periosteum were infiltrated with Bupivacaine 0.5%. A skin incision was made at the access site. Subsequently, an 11-gauge Kyphon trocar was inserted under fluoroscopic guidance until contact with the pedicle  was obtained. The trocar was inserted into the pedicle with light hammer tapping until the posterior boundaries of the vertebral body was reached. The diamond mandrill was removed and one core biopsy was obtained. The skin was infiltrated with a 1% Lidocaine approximately 3.5 cm lateral to the spinous process projection on the left. Using a 22-gauge spinal needle, the soft issue and the peripedicular space and periosteum were infiltrated with Bupivacaine 0.5%. A skin incision was made at the access site. Subsequently, an 11-gauge Kyphon trocar was inserted under fluoroscopic guidance until contact with the pedicle was obtained. The trocar was inserted with light hammer tapping into the pedicle until the posterior boundaries of the vertebral body was  reached. A bone drill was coaxially advanced within the anterior third of the vertebral body on each side for proper RF probes size selection. An OsteoCool RF probe was inserted bilaterally within the mid-posterior third of the vertebral body. The radiofrequency ablation cycle was performed. Thereafter, the RF probes were exchanged for inflatable Kyphon balloons, which were centered within the mid-aspect of the vertebral body. The balloons were inflated to create a void to serve as a repository for the bone cement. Both balloons were deflated and through both cannulas, under continuous fluoroscopy guidance in the AP and lateral views, the vertebral body was filled with previously mixed polymethyl-methacrylate (PMMA) added to barium for opacification. Both cannulas were later removed. Under fluoroscopy, the L2 vertebral body was delineated and the skin area was marked. The skin was infiltrated with a 1% Lidocaine approximately 4 cm lateral to the spinous process projection on the right. Using a 22-gauge spinal needle, the soft issue and the peripedicular space and periosteum were infiltrated with Bupivacaine 0.5%. A skin incision was made at the access site. Subsequently, an 11-gauge Kyphon trocar was inserted under fluoroscopic guidance until contact with the pedicle was obtained. The trocar was inserted into the pedicle with light hammer tapping until the posterior boundaries of the vertebral body was reached. The diamond mandrill was removed. The skin was infiltrated with a 1% Lidocaine approximately 4 cm lateral to the spinous process projection on the left. Using a 22-gauge spinal needle, the soft issue and the peripedicular space and periosteum were infiltrated with Bupivacaine 0.5%. A skin incision was made at the access site. Subsequently, an 11-gauge Kyphon trocar was inserted under fluoroscopic guidance until contact with the pedicle was obtained. The trocar was inserted with light hammer tapping into the  pedicle until the posterior boundaries of the vertebral body was reached. A bone drill was coaxially advanced within the anterior third of the vertebral body on each side for proper RF probes size selection. An OsteoCool RF probe was inserted bilaterally within the mid-posterior third of the vertebral body. The radiofrequency ablation cycle was performed. Thereafter, the RF probes were exchanged for inflatable Kyphon balloons, which were centered within the mid-aspect of the vertebral body. The balloons were inflated to create a void to serve as a repository for the bone cement. Both balloons were deflated and through both cannulas, under continuous fluoroscopy guidance in the AP and lateral views, the vertebral body was filled with previously mixed polymethyl-methacrylate (PMMA) added to barium for opacification. Both cannulas were later removed. Postprocedural radiograph showed adequate cement distribution within the corresponding vertebral bodies without evidence of significant leakage or embolism. IMPRESSION: Successful fluoroscopic-guided L1 core biopsy and L1 and L2 radiofrequency ablation followed by kyphoplasty for treatment of symptomatic acute pathologic fractures. Biopsy sample obtained was sent for tissue exam. Electronically  Signed   By: Pedro Earls M.D.   On: 02/24/2022 13:12   IR KYPHO EA ADDL LEVEL THORACIC OR LUMBAR  Result Date: 02/24/2022 INDICATION: 51 year old male with known multiple myeloma and acute symptomatic pathologic fractures of the L1 and L2 vertebral bodies. EXAM: FLUOROSCOPY GUIDED L1 AND L2 RADIOFREQUENCY ABLATION AND BALLOON KYPHOPLASTY COMPARISON:  CT lumbar spine February 17, 2022. MEDICATIONS: As antibiotic prophylaxis, Ancef 2 g IV was ordered pre-procedure and administered intravenously within 1 hour of incision. All current medications are in the EMR and have been reviewed as part of this encounter. ANESTHESIA/SEDATION: Moderate (conscious) sedation was  employed during this procedure. A total of Versed 6 mg, Dilaudid 1 mg and Fentanyl 200 mcg were administered intravenously by the radiology nurse. Total intra-service moderate Sedation Time: 1 hour and 47 minutes. The patient's level of consciousness and vital signs were monitored continuously by radiology nursing throughout the procedure under my direct supervision. FLUOROSCOPY: Radiation Exposure Index (as provided by the fluoroscopic device): 5,852 mGy Kerma COMPLICATIONS: None immediate. PROCEDURE: Following a full explanation of the procedure along with the potential associated complications, an informed witnessed consent was obtained. The patient was placed in prone position on the angiography table. The lumbar region was prepped and draped in a sterile fashion. Under fluoroscopy, the L1 vertebral body was delineated and the skin area was marked. The skin was infiltrated with a 1% Lidocaine approximately 3.5 cm lateral to the spinous process projection on the right. Using a 22-gauge spinal needle, the soft issue and the peripedicular space and periosteum were infiltrated with Bupivacaine 0.5%. A skin incision was made at the access site. Subsequently, an 11-gauge Kyphon trocar was inserted under fluoroscopic guidance until contact with the pedicle was obtained. The trocar was inserted into the pedicle with light hammer tapping until the posterior boundaries of the vertebral body was reached. The diamond mandrill was removed and one core biopsy was obtained. The skin was infiltrated with a 1% Lidocaine approximately 3.5 cm lateral to the spinous process projection on the left. Using a 22-gauge spinal needle, the soft issue and the peripedicular space and periosteum were infiltrated with Bupivacaine 0.5%. A skin incision was made at the access site. Subsequently, an 11-gauge Kyphon trocar was inserted under fluoroscopic guidance until contact with the pedicle was obtained. The trocar was inserted with light  hammer tapping into the pedicle until the posterior boundaries of the vertebral body was reached. A bone drill was coaxially advanced within the anterior third of the vertebral body on each side for proper RF probes size selection. An OsteoCool RF probe was inserted bilaterally within the mid-posterior third of the vertebral body. The radiofrequency ablation cycle was performed. Thereafter, the RF probes were exchanged for inflatable Kyphon balloons, which were centered within the mid-aspect of the vertebral body. The balloons were inflated to create a void to serve as a repository for the bone cement. Both balloons were deflated and through both cannulas, under continuous fluoroscopy guidance in the AP and lateral views, the vertebral body was filled with previously mixed polymethyl-methacrylate (PMMA) added to barium for opacification. Both cannulas were later removed. Under fluoroscopy, the L2 vertebral body was delineated and the skin area was marked. The skin was infiltrated with a 1% Lidocaine approximately 4 cm lateral to the spinous process projection on the right. Using a 22-gauge spinal needle, the soft issue and the peripedicular space and periosteum were infiltrated with Bupivacaine 0.5%. A skin incision was made at the access site.  Subsequently, an 11-gauge Kyphon trocar was inserted under fluoroscopic guidance until contact with the pedicle was obtained. The trocar was inserted into the pedicle with light hammer tapping until the posterior boundaries of the vertebral body was reached. The diamond mandrill was removed. The skin was infiltrated with a 1% Lidocaine approximately 4 cm lateral to the spinous process projection on the left. Using a 22-gauge spinal needle, the soft issue and the peripedicular space and periosteum were infiltrated with Bupivacaine 0.5%. A skin incision was made at the access site. Subsequently, an 11-gauge Kyphon trocar was inserted under fluoroscopic guidance until contact  with the pedicle was obtained. The trocar was inserted with light hammer tapping into the pedicle until the posterior boundaries of the vertebral body was reached. A bone drill was coaxially advanced within the anterior third of the vertebral body on each side for proper RF probes size selection. An OsteoCool RF probe was inserted bilaterally within the mid-posterior third of the vertebral body. The radiofrequency ablation cycle was performed. Thereafter, the RF probes were exchanged for inflatable Kyphon balloons, which were centered within the mid-aspect of the vertebral body. The balloons were inflated to create a void to serve as a repository for the bone cement. Both balloons were deflated and through both cannulas, under continuous fluoroscopy guidance in the AP and lateral views, the vertebral body was filled with previously mixed polymethyl-methacrylate (PMMA) added to barium for opacification. Both cannulas were later removed. Postprocedural radiograph showed adequate cement distribution within the corresponding vertebral bodies without evidence of significant leakage or embolism. IMPRESSION: Successful fluoroscopic-guided L1 core biopsy and L1 and L2 radiofrequency ablation followed by kyphoplasty for treatment of symptomatic acute pathologic fractures. Biopsy sample obtained was sent for tissue exam. Electronically Signed   By: Pedro Earls M.D.   On: 02/24/2022 13:12   IR Bone Tumor(s)RF Ablation  Result Date: 02/24/2022 INDICATION: 51 year old male with known multiple myeloma and acute symptomatic pathologic fractures of the L1 and L2 vertebral bodies. EXAM: FLUOROSCOPY GUIDED L1 AND L2 RADIOFREQUENCY ABLATION AND BALLOON KYPHOPLASTY COMPARISON:  CT lumbar spine February 17, 2022. MEDICATIONS: As antibiotic prophylaxis, Ancef 2 g IV was ordered pre-procedure and administered intravenously within 1 hour of incision. All current medications are in the EMR and have been reviewed as  part of this encounter. ANESTHESIA/SEDATION: Moderate (conscious) sedation was employed during this procedure. A total of Versed 6 mg, Dilaudid 1 mg and Fentanyl 200 mcg were administered intravenously by the radiology nurse. Total intra-service moderate Sedation Time: 1 hour and 47 minutes. The patient's level of consciousness and vital signs were monitored continuously by radiology nursing throughout the procedure under my direct supervision. FLUOROSCOPY: Radiation Exposure Index (as provided by the fluoroscopic device): 0,973 mGy Kerma COMPLICATIONS: None immediate. PROCEDURE: Following a full explanation of the procedure along with the potential associated complications, an informed witnessed consent was obtained. The patient was placed in prone position on the angiography table. The lumbar region was prepped and draped in a sterile fashion. Under fluoroscopy, the L1 vertebral body was delineated and the skin area was marked. The skin was infiltrated with a 1% Lidocaine approximately 3.5 cm lateral to the spinous process projection on the right. Using a 22-gauge spinal needle, the soft issue and the peripedicular space and periosteum were infiltrated with Bupivacaine 0.5%. A skin incision was made at the access site. Subsequently, an 11-gauge Kyphon trocar was inserted under fluoroscopic guidance until contact with the pedicle was obtained. The trocar was inserted into the  pedicle with light hammer tapping until the posterior boundaries of the vertebral body was reached. The diamond mandrill was removed and one core biopsy was obtained. The skin was infiltrated with a 1% Lidocaine approximately 3.5 cm lateral to the spinous process projection on the left. Using a 22-gauge spinal needle, the soft issue and the peripedicular space and periosteum were infiltrated with Bupivacaine 0.5%. A skin incision was made at the access site. Subsequently, an 11-gauge Kyphon trocar was inserted under fluoroscopic guidance until  contact with the pedicle was obtained. The trocar was inserted with light hammer tapping into the pedicle until the posterior boundaries of the vertebral body was reached. A bone drill was coaxially advanced within the anterior third of the vertebral body on each side for proper RF probes size selection. An OsteoCool RF probe was inserted bilaterally within the mid-posterior third of the vertebral body. The radiofrequency ablation cycle was performed. Thereafter, the RF probes were exchanged for inflatable Kyphon balloons, which were centered within the mid-aspect of the vertebral body. The balloons were inflated to create a void to serve as a repository for the bone cement. Both balloons were deflated and through both cannulas, under continuous fluoroscopy guidance in the AP and lateral views, the vertebral body was filled with previously mixed polymethyl-methacrylate (PMMA) added to barium for opacification. Both cannulas were later removed. Under fluoroscopy, the L2 vertebral body was delineated and the skin area was marked. The skin was infiltrated with a 1% Lidocaine approximately 4 cm lateral to the spinous process projection on the right. Using a 22-gauge spinal needle, the soft issue and the peripedicular space and periosteum were infiltrated with Bupivacaine 0.5%. A skin incision was made at the access site. Subsequently, an 11-gauge Kyphon trocar was inserted under fluoroscopic guidance until contact with the pedicle was obtained. The trocar was inserted into the pedicle with light hammer tapping until the posterior boundaries of the vertebral body was reached. The diamond mandrill was removed. The skin was infiltrated with a 1% Lidocaine approximately 4 cm lateral to the spinous process projection on the left. Using a 22-gauge spinal needle, the soft issue and the peripedicular space and periosteum were infiltrated with Bupivacaine 0.5%. A skin incision was made at the access site. Subsequently, an  11-gauge Kyphon trocar was inserted under fluoroscopic guidance until contact with the pedicle was obtained. The trocar was inserted with light hammer tapping into the pedicle until the posterior boundaries of the vertebral body was reached. A bone drill was coaxially advanced within the anterior third of the vertebral body on each side for proper RF probes size selection. An OsteoCool RF probe was inserted bilaterally within the mid-posterior third of the vertebral body. The radiofrequency ablation cycle was performed. Thereafter, the RF probes were exchanged for inflatable Kyphon balloons, which were centered within the mid-aspect of the vertebral body. The balloons were inflated to create a void to serve as a repository for the bone cement. Both balloons were deflated and through both cannulas, under continuous fluoroscopy guidance in the AP and lateral views, the vertebral body was filled with previously mixed polymethyl-methacrylate (PMMA) added to barium for opacification. Both cannulas were later removed. Postprocedural radiograph showed adequate cement distribution within the corresponding vertebral bodies without evidence of significant leakage or embolism. IMPRESSION: Successful fluoroscopic-guided L1 core biopsy and L1 and L2 radiofrequency ablation followed by kyphoplasty for treatment of symptomatic acute pathologic fractures. Biopsy sample obtained was sent for tissue exam. Electronically Signed   By: Jeanella Craze  Debbrah Alar M.D.   On: 02/24/2022 13:12   DG Humerus Left  Result Date: 02/22/2022 CLINICAL DATA:  Left humerus intramedullary nail. Intraoperative fluoroscopy. EXAM: LEFT HUMERUS - 2+ VIEW COMPARISON:  Left shoulder radiographs 02/17/2022 and 02/04/2022 FINDINGS: Images were performed intraoperatively without the presence of a radiologist. New intramedullary nail fixation of the previously seen pathologic fracture of the proximal humeral diaphysis. No hardware complication is seen.  Total fluoroscopy images: 6 Total fluoroscopy time: 84 seconds Total dose: Radiation Exposure Index (as provided by the fluoroscopic device): 8.27 mGy air Kerma Please see intraoperative findings for further detail. IMPRESSION: Intraoperative fluoroscopy provided for intramedullary nail fixation of the known proximal humeral diaphysis pathologic fracture. Electronically Signed   By: Yvonne Kendall M.D.   On: 02/22/2022 13:38   DG C-Arm 1-60 Min-No Report  Result Date: 02/22/2022 Fluoroscopy was utilized by the requesting physician.  No radiographic interpretation.   DG C-Arm 1-60 Min-No Report  Result Date: 02/22/2022 Fluoroscopy was utilized by the requesting physician.  No radiographic interpretation.   DG Chest Port 1 View  Result Date: 02/20/2022 CLINICAL DATA:  Shortness of breath.  Fracture of left shoulder. EXAM: PORTABLE CHEST 1 VIEW COMPARISON:  02/17/2022 FINDINGS: Heart size and mediastinal contours are stable. Lungs are hypoinflated. Pulmonary vascular congestion. No pleural effusion or edema. No airspace disease. Pathologic fracture involving the proximal left humerus is noted. IMPRESSION: 1. Pulmonary vascular congestion. 2. Pathologic fracture involving the proximal left humerus. Electronically Signed   By: Kerby Moors M.D.   On: 02/20/2022 08:07   DG Shoulder Left  Result Date: 02/17/2022 CLINICAL DATA:  Fall EXAM: LEFT SHOULDER - 3 VIEW COMPARISON:  Shoulder radiograph dated February 04, 2022 FINDINGS: Oblique displaced fracture of the proximal left humeral shaft. Associated lucent lesion of the proximal left humerus is again seen. Associated soft tissue edema of the proximal left arm. Visualized lung is clear. IMPRESSION: Oblique displaced fracture of the proximal left humeral shaft with associated lucent lesion, findings are concerning for pathologic compression fracture. Electronically Signed   By: Yetta Glassman M.D.   On: 02/17/2022 13:13   DG Pelvis Portable  Result  Date: 02/17/2022 CLINICAL DATA:  Fall. EXAM: PORTABLE PELVIS 1-2 VIEWS COMPARISON:  CT abdomen and pelvis 07/21/2021 FINDINGS: No acute fracture or pelvic diastasis is identified. There is mild left hip joint space narrowing. IMPRESSION: No acute osseous abnormality identified. Electronically Signed   By: Logan Bores M.D.   On: 02/17/2022 13:09   DG Chest Portable 1 View  Result Date: 02/17/2022 CLINICAL DATA:  Fall. EXAM: PORTABLE CHEST 1 VIEW COMPARISON:  Chest radiographs 05/04/2019 FINDINGS: The cardiomediastinal silhouette is unchanged with normal heart size. Lung volumes are low with bronchovascular crowding. No confluent airspace opacity, overt pulmonary edema, sizable pleural effusion, or pneumothorax is identified. No acute osseous abnormality is seen. IMPRESSION: No active disease. Electronically Signed   By: Logan Bores M.D.   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 Low back pain. Status post recent L3 vertebral body biopsy, RF ablation, augmentation on 02/05/2022. Pathology revealed plasmacytoma. EXAM: CT LUMBAR SPINE WITHOUT CONTRAST TECHNIQUE: Multidetector CT imaging of the lumbar spine was performed without intravenous contrast administration. Multiplanar CT image reconstructions were also generated. RADIATION DOSE REDUCTION: This exam was performed according to the departmental dose-optimization program which includes automated exposure control, adjustment of the mA and/or kV according to patient size and/or use of iterative  reconstruction technique. COMPARISON:  Lumbar MRI 01/07/2022. CT Abdomen and Pelvis 07/21/2021. FINDINGS: Segmentation: Normal. Alignment: Stable lumbar lordosis. Vertebrae: Pronounced heterogeneity of bone mineralization since February this year. Diffuse lytic vertebral and visible pelvic bone lesions. Mild pathologic compression fracture of L1. More  moderate pathologic compression fracture of L2, with lucency of the anterior superior endplate and 26% central loss of vertebral body height compared to February. No retropulsion of bone. L2 pedicles and posterior elements appear intact. Augmented moderate to severe L3 pathologic compression fracture. No significant L4 vertebral compression. Subtle L5 pathologic vertebral compression. Visible sacral ala appear intact despite myelomas involvement. Paraspinal and other soft tissues: Negative visible noncontrast abdominal viscera. Lumbar paraspinal soft tissues remain within normal limits. Disc levels: Lumbar spine degeneration predominantly moderate to severe facet arthropathy at L4-L5 where there is subtle chronic grade 1 anterolisthesis. IMPRESSION: 1. Diffuse Multiple Myeloma involvement of the visible spine and pelvis, new since a February CT. 2. Augmented compression fracture of L3. Mild-to-moderate pathologic compression fracture of L2 (22% loss of vertebral body height, no complicating features). Mild pathologic compression fracture of L1. Electronically Signed   By: Genevie Ann M.D.   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 Low back pain. Status post recent L3 vertebral body biopsy, RF ablation, augmentation on 02/05/2022. Pathology revealed plasmacytoma. Neck pain. EXAM: CT CERVICAL SPINE WITHOUT CONTRAST TECHNIQUE: Multidetector CT imaging of the cervical spine was performed without intravenous contrast. Multiplanar CT image reconstructions were also generated. RADIATION DOSE REDUCTION: This exam was performed according to the departmental dose-optimization program which includes automated exposure control, adjustment of the mA and/or kV according to patient size and/or use of iterative reconstruction technique. COMPARISON:  Cervical spine radiographs 12/18/2021. FINDINGS: Alignment:  Straightening of cervical lordosis. Cervicothoracic junction alignment is within normal limits. Bilateral posterior element alignment is within normal limits. Skull base and vertebrae: Visualized skull base is intact. No atlanto-occipital dissociation. C1 and C2 appear intact and aligned. Widely scattered ill-defined lucent lesions are present elsewhere in the cervical spine and visible upper thoracic spine C3 through T2. No cervical pathologic fracture is identified. Posterior element involvement is noted on the right at C4 (series 4, image 44). No expansile cervical lesion at this time. Soft tissues and spinal canal: No prevertebral fluid or swelling. No visible canal hematoma. Negative noncontrast visible neck soft tissues. Disc levels: Widespread superimposed cervical spine disc and endplate degeneration. Multilevel degenerative cervical spinal stenosis, mild to moderate at C5-C6. Upper chest: T1 and T2 vertebral myeloma thus involvement, including an indistinct 10 mm lucent lesion of the left T1 body. Lung apices are clear. Negative visible noncontrast superior mediastinum. Other: Negative visible noncontrast posterior fossa. Visible paranasal sinuses, tympanic cavities and mastoids are well aerated. IMPRESSION: 1. Widespread cervical and visible upper thoracic vertebral involvement by Multiple Myeloma. No cervical pathologic fracture. 2. Superimposed bulky cervical spine degeneration. Subsequent mild to moderate degenerative spinal stenosis suspected at C5-C6. Electronically Signed   By: Genevie Ann M.D.   On: 02/17/2022 09:41   CT BONE MARROW BIOPSY & ASPIRATION  Result Date: 02/10/2022 INDICATION: 51 year old male with monoclonal gammopathy of uncertain significance and possible multiple myeloma. EXAM: CT GUIDED BONE MARROW ASPIRATION AND CORE BIOPSY Interventional Radiologist:  Criselda Peaches, MD MEDICATIONS: None. ANESTHESIA/SEDATION: None FLUOROSCOPY: None COMPLICATIONS: None immediate. Estimated  blood loss: <25 mL PROCEDURE: Informed written consent was obtained from the patient after a thorough discussion of  the procedural risks, benefits and alternatives. All questions were addressed. Maximal Sterile Barrier Technique was utilized including caps, mask, sterile gowns, sterile gloves, sterile drape, hand hygiene and skin antiseptic. A timeout was performed prior to the initiation of the procedure. The patient was positioned prone and non-contrast localization CT was performed of the pelvis to demonstrate the iliac marrow spaces. Maximal barrier sterile technique utilized including caps, mask, sterile gowns, sterile gloves, large sterile drape, hand hygiene, and betadine prep. Under sterile conditions and local anesthesia, an 11 gauge coaxial bone biopsy needle was advanced into the right iliac marrow space. Needle position was confirmed with CT imaging. Initially, bone marrow aspiration was performed. Next, the 11 gauge outer cannula was utilized to obtain a right iliac bone marrow core biopsy. Needle was removed. Hemostasis was obtained with compression. The patient tolerated the procedure well. Samples were prepared with the cytotechnologist. IMPRESSION: Successful right iliac bone marrow aspiration and core biopsy. Electronically Signed   By: Jacqulynn Cadet M.D.   On: 02/10/2022 15:18   IR KYPHO LUMBAR INC FX REDUCE BONE BX UNI/BIL CANNULATION INC/IMAGING  Result Date: 02/05/2022 INDICATION: Pathologic L3 vertebral body compression fracture, concern for multiple myeloma versus other hematologic malignancy EXAM: 1. L3 vertebral body biopsy using fluoroscopic guidance 2. L3 vertebral body radiofrequency ablation using OsteoCool 3. L3 vertebral body augmentation using balloon kyphoplasty COMPARISON:  MRI lumbar spine January 07, 2022 MEDICATIONS: As antibiotic prophylaxis, Ancef 2gm IV was ordered pre-procedure and administered intravenously within 1 hour of incision. All current medications are in  the EMR and have been reviewed as part of this encounter. ANESTHESIA/SEDATION: Moderate (conscious) sedation was employed during this procedure. A total of Versed 5 mg and Fentanyl 200 mcg was administered intravenously by the radiology nurse. Total intra-service moderate Sedation Time: 64 minutes. The patient's level of consciousness and vital signs were monitored continuously by radiology nursing throughout the procedure under my direct supervision. FLUOROSCOPY: Radiation Exposure Index (as provided by the fluoroscopic device): 5.9 minutes (98 mGy) COMPLICATIONS: None immediate. PROCEDURE: Informed written consent was obtained from the patient after a thorough discussion of the procedural risks, benefits and alternatives. All questions were addressed. Maximal Sterile Barrier Technique was utilized including caps, mask, sterile gowns, sterile gloves, sterile drape, hand hygiene and skin antiseptic. A timeout was performed prior to the initiation of the procedure. The patient was positioned prone on the exam table. A bipedicular access was planned. Skin entry site(s) were marked overlying the L3 vertebral body using fluoroscopy. The overlying skin was then prepped and draped in the standard sterile fashion. Local analgesia was obtained with 1% lidocaine. Attention was first turned to the right side. Under fluoroscopic guidance, a 10 gauge introducer needle was advanced towards the lateral margin of the pedicle. Using multiple projections, the introducer needle was advanced towards the posterior margin of the vertebral body via a transpedicular approach. The posterior margin of the ablation zone was then marked using the inner needle of the introducer. The inner needle was then removed, and a core needle biopsy of the vertebral body was performed using a 10 gauge core biopsy device. A small amount of tissue was retrieved and sent to the lab for analysis. Next, the marking drill was advanced towards the anterior  margin of the vertebral body for RF probe selection. The 20 mm RF ablation probe was determined to be appropriate. Attention was then turned to the contralateral side. Under fluoroscopic guidance, a 10 gauge introducer needle was advanced towards the lateral margin  of the pedicle. Using multiple projections, the introducer needle was advanced towards the posterior margin of the vertebral body via a transpedicular approach. Again, the posterior margin of the ablation zone was then marked using the inner needle of the introducer. In a similar fashion, core needle biopsy of the vertebral body was performed using a 10 gauge core biopsy device. Small amount of tissue was again retrieved and sent to the lab for analysis. The marking drill was then advanced towards the anterior margin of the vertebral body for RF probe selection. The 20 mm RF ablation probe was determined to be appropriate. The selected RF ablation probes were then advanced through the bilateral transpedicular access needles and locked into position. Appropriate location within the vertebral body was confirmed with multiple projections. RF ablation was then performed for 15 minutes at 70 degrees Celsius. The patient tolerated this portion of the procedure well without significant discomfort. At this time, the acrylic bone cement mixture was reconstituted in the Kyphon bone mixing device system. This was then loaded onto the Kyphon bone fillers. Upon completion of the RF ablation, the ablation probes were removed. The Kyphon 15 mm inflatable bone tamps were then advanced through the bilateral transpedicular access needles and positioned within the mid vertebral body. Kyphoplasty was then performed, ensuring that the balloon contours stayed within the vertebral body margins. The balloons were then deflated and removed, followed by advancement of the bone filler devices bilaterally and the instillation of acrylic bone cement with excellent filling in the AP  and lateral projections. No extravasation was noted in the disk spaces or posteriorly into the spinal canal. No epidural venous contamination was seen. At the end of the procedure, the introducer cannulas and bone filler devices were then removed without difficulty. Clean dressings were placed after hemostasis. The patient tolerated all aspects of the procedure well, and was transferred to recovery in stable condition. IMPRESSION: 1. Successful core biopsy of the L3 vertebral body using fluoroscopic guidance. 2. Successful radiofrequency ablation (OsteoCool) of the L3 vertebral body. 3. Successful L3 vertebral body augmentation using balloon kyphoplasty. If the patient has known osteoporosis, recommend treatment as clinically indicated. If the patient's bone density status is unknown, DEXA scan is recommended. Electronically Signed   By: Albin Felling M.D.   On: 02/05/2022 11:28   IR Bone Tumor(s)RF Ablation  Result Date: 02/05/2022 INDICATION: Pathologic L3 vertebral body compression fracture, concern for multiple myeloma versus other hematologic malignancy EXAM: 1. L3 vertebral body biopsy using fluoroscopic guidance 2. L3 vertebral body radiofrequency ablation using OsteoCool 3. L3 vertebral body augmentation using balloon kyphoplasty COMPARISON:  MRI lumbar spine January 07, 2022 MEDICATIONS: As antibiotic prophylaxis, Ancef 2gm IV was ordered pre-procedure and administered intravenously within 1 hour of incision. All current medications are in the EMR and have been reviewed as part of this encounter. ANESTHESIA/SEDATION: Moderate (conscious) sedation was employed during this procedure. A total of Versed 5 mg and Fentanyl 200 mcg was administered intravenously by the radiology nurse. Total intra-service moderate Sedation Time: 64 minutes. The patient's level of consciousness and vital signs were monitored continuously by radiology nursing throughout the procedure under my direct supervision. FLUOROSCOPY:  Radiation Exposure Index (as provided by the fluoroscopic device): 5.9 minutes (98 mGy) COMPLICATIONS: None immediate. PROCEDURE: Informed written consent was obtained from the patient after a thorough discussion of the procedural risks, benefits and alternatives. All questions were addressed. Maximal Sterile Barrier Technique was utilized including caps, mask, sterile gowns, sterile gloves, sterile drape, hand hygiene  and skin antiseptic. A timeout was performed prior to the initiation of the procedure. The patient was positioned prone on the exam table. A bipedicular access was planned. Skin entry site(s) were marked overlying the L3 vertebral body using fluoroscopy. The overlying skin was then prepped and draped in the standard sterile fashion. Local analgesia was obtained with 1% lidocaine. Attention was first turned to the right side. Under fluoroscopic guidance, a 10 gauge introducer needle was advanced towards the lateral margin of the pedicle. Using multiple projections, the introducer needle was advanced towards the posterior margin of the vertebral body via a transpedicular approach. The posterior margin of the ablation zone was then marked using the inner needle of the introducer. The inner needle was then removed, and a core needle biopsy of the vertebral body was performed using a 10 gauge core biopsy device. A small amount of tissue was retrieved and sent to the lab for analysis. Next, the marking drill was advanced towards the anterior margin of the vertebral body for RF probe selection. The 20 mm RF ablation probe was determined to be appropriate. Attention was then turned to the contralateral side. Under fluoroscopic guidance, a 10 gauge introducer needle was advanced towards the lateral margin of the pedicle. Using multiple projections, the introducer needle was advanced towards the posterior margin of the vertebral body via a transpedicular approach. Again, the posterior margin of the ablation  zone was then marked using the inner needle of the introducer. In a similar fashion, core needle biopsy of the vertebral body was performed using a 10 gauge core biopsy device. Small amount of tissue was again retrieved and sent to the lab for analysis. The marking drill was then advanced towards the anterior margin of the vertebral body for RF probe selection. The 20 mm RF ablation probe was determined to be appropriate. The selected RF ablation probes were then advanced through the bilateral transpedicular access needles and locked into position. Appropriate location within the vertebral body was confirmed with multiple projections. RF ablation was then performed for 15 minutes at 70 degrees Celsius. The patient tolerated this portion of the procedure well without significant discomfort. At this time, the acrylic bone cement mixture was reconstituted in the Kyphon bone mixing device system. This was then loaded onto the Kyphon bone fillers. Upon completion of the RF ablation, the ablation probes were removed. The Kyphon 15 mm inflatable bone tamps were then advanced through the bilateral transpedicular access needles and positioned within the mid vertebral body. Kyphoplasty was then performed, ensuring that the balloon contours stayed within the vertebral body margins. The balloons were then deflated and removed, followed by advancement of the bone filler devices bilaterally and the instillation of acrylic bone cement with excellent filling in the AP and lateral projections. No extravasation was noted in the disk spaces or posteriorly into the spinal canal. No epidural venous contamination was seen. At the end of the procedure, the introducer cannulas and bone filler devices were then removed without difficulty. Clean dressings were placed after hemostasis. The patient tolerated all aspects of the procedure well, and was transferred to recovery in stable condition. IMPRESSION: 1. Successful core biopsy of the L3  vertebral body using fluoroscopic guidance. 2. Successful radiofrequency ablation (OsteoCool) of the L3 vertebral body. 3. Successful L3 vertebral body augmentation using balloon kyphoplasty. If the patient has known osteoporosis, recommend treatment as clinically indicated. If the patient's bone density status is unknown, DEXA scan is recommended. Electronically Signed   By: Murrell Redden  El-Abd M.D.   On: 02/05/2022 11:28   DG Humerus Left  Result Date: 02/04/2022 CLINICAL DATA:  Acute left arm pain without known injury. EXAM: LEFT HUMERUS - 2+ VIEW COMPARISON:  None Available. FINDINGS: No definite fracture is noted. However, there is seen a large lucency or lytic lesion seen involving the proximal left humeral shaft concerning for metastatic disease or possibly myeloma. IMPRESSION: Probable large lytic lesion seen involving proximal left humeral shaft concerning for metastatic disease or possible myeloma. MRI is recommended for further evaluation. Electronically Signed   By: Marijo Conception M.D.   On: 02/04/2022 12:33   DG Shoulder Left  Result Date: 02/04/2022 CLINICAL DATA:  Left shoulder pain without known injury. EXAM: LEFT SHOULDER - 2+ VIEW COMPARISON:  December 18, 2021. FINDINGS: There is no evidence of fracture or dislocation. Joint spaces are intact. However, lucency is seen involving the medullary portion of the proximal left humeral shaft scalping of the adjacent cortices, concerning for neoplasm or malignancy. Soft tissues are unremarkable. IMPRESSION: Large lucency is seen in the medullary portion of the proximal left humeral shaft with scalloping of the adjacent cortices concerning for destructive neoplasm or metastatic disease. Multiple myeloma is a consideration. MRI is recommended for further evaluation. Electronically Signed   By: Marijo Conception M.D.   On: 02/04/2022 12:32   DG Radiologist Eval And Mgmt  Result Date: 01/30/2022 EXAMINATION: New patient consultation HISTORY: HISTORY OF  PRESENT ILLNESS: Garrelts, Bodee J; 51 y/o -old; M who presents with painful subacute lumbar compression fracture. He developed severe lumbar pain several weeks ago. This developed without discrete accident or inciting factor. No previous episodes. He rates his pain 10/10 on the visual analog pain scale at its worst, improved to 7/10 with current prescribed pain medications. His pain is nonradiating. Pain is most acute when sitting and standing, somewhat relieved when sitting and supine. No new bowel or bladder control issues. No new lower extremity weakness or numbness. Medication: hydrochlorothiazide (HYDRODIURIL) 25 MG tablet Take 1 tablet (25 mg total) by mouth daily. ibuprofen (ADVIL) 600 MG tablet Take 1 tablet (600 mg total) by mouth every 6 (six) hours as needed. lisinopril (ZESTRIL) 40 MG tablet Take 1 tablet (40 mg total) by mouth daily. oxyCODONE (OXY IR/ROXICODONE) 5 MG immediate release tablet Take 1-2 tablets (5-10 mg total) by mouth every 6 (six) hours as needed for severe pain. pantoprazole (PROTONIX) 40 MG tablet Take 1 tablet (40 mg total) by mouth daily. rosuvastatin (CRESTOR) 10 MG tablet Take 1 tablet (10 mg total) by mouth daily. Allergies: None known Past Medical History: . Allergy . Atypical chest pain . GERD (gastroesophageal reflux disease) . Hepatic cyst Benign by MRI . Hiatal hernia . History of colonic polyps . Hyperlipidemia . Hypertension . Rectal bleeding . Tobacco abuse Surgical History: . COLONOSCOPY 01/10/2009 RMR: Normal rectum, normal colon; repeat in 2015 due to [place] of colon cancer . COLONOSCOPY N/A 01/09/2014 [id number] colonic polyps-removed as described . COLONOSCOPY N/A 09/18/2016 Procedure: COLONOSCOPY; Surgeon: Daneil Dolin, MD; [facility]; Service: Endoscopy; Laterality: N/A; 730 . ESOPHAGOGASTRODUODENOSCOPY 10/04/2007 RMR: Distal esophageal erosions consistent with erosive reflux esophagitis, patulous gastroesophageal junction status post passage of a  [place] dilator, 63 [place]. Otherwise, unremarkable esophagus. Hiatal hernia. Otherwise normal stomach. Bulbar erosion . ESOPHAGOGASTRODUODENOSCOPY N/A 01/09/2014 Erosive reflux esophagitis. Small hiatal hernia . I & D EXTREMITY Left 07/26/2018 Procedure: IRRIGATION AND DEBRIDEMENT EXTREMITY; Surgeon: Dayna Barker, MD; Location: [place]; Service: Plastics; Laterality: Left; . PERCUTANEOUS PINNING Left  07/26/2018 Procedure: PERCUTANEOUS PINNING EXTREMITY; Surgeon: Dayna Barker, MD; Location: [place]; Service: Plastics; Laterality: Left; Family History: . Heart disease Mother . Hypertension Mother . Diabetes Father . Hypertension Father . Colon cancer Sister passed away from colon cancer, in her 49s . Heart attack Maternal Uncle . Prostate cancer Maternal Uncle . Diabetes Other . Pancreatic cancer Neg Hx . Rectal cancer Neg Hx . Esophageal cancer Neg Hx . Stomach cancer Neg Hx Social History: Socioeconomic History . Marital status: Single . Number of children: 2 Occupational History . Occupation: Estate agent: [employer] Comment: IT sales professional in [place] . Smoking status: Every Day Packs/day: 0.25 Years: 10.00 Total pack years: 2.50 Types: Cigarettes . Alcohol use: Yes Alcohol/week: 0.0 standard drinks of alcohol Comment: occasional/wine on the weekends . Drug use: No . Sexual activity: Not on file Other Topics Concern Right handed Lives alone one story home Advance Care: The advanced care plan/surrogate decision maker was discussed at the time of visit and the patient did not wish to discuss or was not able to name a surrogate decision maker or provide an advance care plan. REVIEW OF SYSTEMS: Constitutional Symptoms: Patient denies weight change, fever or chills at this time. Eyes: None Ears, Nose, Mouth, Throat: No auditory complaints, no sore throat, no sinusitis. Cardiovascular/Vascular: No known heart problems. Denies chest pain or palpitations. Respiratory: Patient denies cough or  shortness of breath. GI: Patient denies abdominal pain, nausea, vomiting, diarrhea, bloody stools or constipation. GU: No change. Musculoskeletal: As above Integumentary (Skin): Patient denies rashes, ulcerations or skin color changes. Neuro: Patient denies decreased sensation, seizures or weakness. Psychological: None Hematological/Lymphatic: Patient denies easy bruising or bleeding. Allergy/Immune: None PHYSICAL EXAMINATION: Fall Risk Assessment: Ambulatory, displays no signs for fall risk using the CDC TUG assessment. Vital Signs: BP 133/83 (BP Location: Right Arm, Patient Position: Sitting, Cuff Size: Normal)  Pulse 83  Temp 98.6 F (37 C) (Oral)  Wt 111.6 kg  SpO2 97% Comment: room air  BMI 36.33 kg/m HENT: Head: Normocephalic and atraumatic. Eyes: Conjunctivae and EOM are normal. Right eye exhibits no discharge. Left eye exhibits no discharge. No scleral icterus. Neck: No JVD present. Pulmonary/Chest: Effort normal. No stridor. No respiratory distress. Abdomen: soft, non distended Neurological:  alert and oriented to person, place, and time. Skin: Skin is warm and dry.  not diaphoretic. Psychiatric: normal mood and affect. behavior is normal. Judgment and thought content normal. BMI: 36.33. For patients, who provided height and weight, and are 18 or older with a BMI of 25.0 - 29.9 (overweight) or 30.0 or over (obesity), it is recommended the patient receive counseling regarding diet, nutrition and activity. IMAGING FINDINGS: MRI LUMBAR SPINE WITHOUT AND WITH CONTRAST 01/07/2022 IMPRESSION: 1. Diffuse heterogeneous marrow signal with heterogeneous enhancement throughout the thoracolumbar spine. The heterogeneous marrow is new compared with an MRI of the abdomen performed 05/27/2016. Overall appearance is concerning for an infiltrative marrow process such as myeloma or leukemia until proven otherwise. Correlate with laboratory values. 2. Subacute-chronic L3 vertebral body compression fracture with  approximately 10% height loss. 3. Mild lumbar spine spondylosis as described above. ASSESSMENT: Knoff, Minas J; 51 y/o -old; M seen in clinic. Patient has subacute lumbar L3 compression fracture deformity which likely contributes or accounts for most of the low back pain. Based on cross-sectional imaging, this would be anatomically approachable for percutaneous intervention. No associated spinal stenosis or other contraindications. Marrow heterogeneity suggesting infiltrative process resulting in pathologic fracture, therefore core biopsy sample could be obtained  at the time of procedure for further characterization. Given the lack of adequate symptom relief with time and a fairly aggressive pain medication regimen, and limitations of activities of daily living, the patient is clinically an appropriate candidate for consideration of vertebral augmentation. I discussed with the patient the pathophysiology of vertebral compression fracture deformities; the stable nature of these which does not require emergent treatment; natural history which includes healing over some unpredictable number of months. We discussed treatment options including watchful waiting, surgical fixation, and percutaneous kyphoplasty/vertebroplasty. We discussed in detail the percutaneous kyphoplasty technique, anticipated benefits, time course to symptom resolution, possible risks and side effects. We discussed his elevated risk of additional level fractures with or without vertebral augmentation. We discussed the long-term need for continued bone building therapy managed by the patient's PCP. He is also scheduled for bone marrow biopsy due to blood work abnormality suggesting possible plasma cell neoplasm. The patient seemed to understand, and did ask appropriate questions. RECOMMENDATIONS: The patient is motivated to proceed with treatment ASAP. Accordingly, we schedule percutaneous lumbar L3 percutaneous bone biopsy, OsteoCool RF ablation,  and kyphoplasty under moderate sedation as an outpatient at the patient's convenience, pending carrier approval if needed. SUMMARY: Approximately 30 minutes were spent with the patient, of which 20 minutes I spent discussing pathologic subacute painful L3 compression fracture treatment options. The patient has suffered a fracture of the lumbar L3 vertebral body, probably pathologic. It is recommended that patients aged 2 years or older be evaluated for possible testing or treatment of osteoporosis. A copy of this consult report is sent to the patient's referring physician. Electronically Signed   By: Lucrezia Europe M.D.   On: 01/30/2022 08:48    ASSESSMENT & PLAN NICKOLAI RINKS 51 y.o. male with medical history significant for newly diagnosed IgG kappa multiple myeloma who presents for a follow up visit.  After review of the labs, review the records, discussion with the patient the findings are most consistent with newly diagnosed multiple myeloma.  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  At this time would recommend proceeding with VRD chemotherapy with the intention of referral for ASCT when his labs are appropriate.  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 -- Recommend VRD chemotherapy with intention of proceeding to transplant. -- Labs at each visit to include CBC, CMP, LDH with monthly restaging labs SPEP and serum free light chains PLAN: --Presents today to start Cycle 1 of VRD therapy --Labs from today were reviewed and adequate for treatment. WBC 9.0, Hgb 10.7, Plt 242, Creatinine 1.18, Calcium 10.8.  --Proceed with treatment today as planned without any dose modifications --Continue with weekly treatments and follow up in 2 weeks.   #Pathologic fractures-secondary to MM: --Involving L1, L2  and L3 compression fracture and left humerus fracture.  --Underwent kyphoplasty of L3 fracture on 02/19/2022 --Underwent medullary nailing of left humeral shaft on 02/22/2022  #Pain Medication -- Currently on MS contin 60 mg q 12 hours and oxycodone 15 mg q 8 hours -- Will make referral to see palliative care NP for further management of pain and other chronic symptoms.   #Supportive Care -- chemotherapy education complete -- port placement not required -- zofran 63m q8H PRN and compazine 163mPO q6H for nausea -- acyclovir 40055mO BID for VCZ prophylaxis --  allopurinol 375m PO daily for TLS prophylaxis -- Awaiting dental clearance for Zometa  No orders of the defined types were placed in this encounter.   All questions were answered. The patient knows to call the clinic with any problems, questions or concerns.  I have spent a total of 30 minutes minutes of face-to-face and non-face-to-face time, preparing to see the [place] medically appropriate examination, counseling and educating the patient, ordering medications/tests, documenting clinical information in the electronic health record,  and care coordination.   IDede QueryPA-C Dept of Hematology and OElmoreat [facility]Ba[facility] LtdPhone: [phone] 02/26/2022 11:01 AM

## 2022-02-27 ENCOUNTER — Telehealth: Payer: Self-pay | Admitting: *Deleted

## 2022-02-27 ENCOUNTER — Other Ambulatory Visit: Payer: Self-pay | Admitting: Hematology and Oncology

## 2022-02-27 ENCOUNTER — Encounter: Payer: Self-pay | Admitting: Physician Assistant

## 2022-02-27 ENCOUNTER — Telehealth: Payer: Self-pay

## 2022-02-27 MED ORDER — MORPHINE SULFATE ER 30 MG PO TBCR: 60.0000 mg | EXTENDED_RELEASE_TABLET | Freq: Two times a day (BID) | ORAL | 0 refills | Status: DC

## 2022-02-27 NOTE — Telephone Encounter (Signed)
Received vm message from pt needing some advise related to his medications. TCT patient and spoke with him. He states he needs to review all his medications so he knows when he is to take them.  Reviewed all his medications.  He was able to write down the instructions and repeat them back with clear understanding. He is currently staying at his sister's home s/p hospital discharge. His girlfriend is bringing his Revlimid over to him this afternoon. He will start the Revlimid today. Discussed his visit with the dentist that occurred on Tuesday. Advised that the the dentist didn't provide adequate clearance information for his future Zometa.. Advised that he did have xrays done and a tooth pulled. Per the patient, he said the dentist was ok with him getting Zometa. Advised that I will call his dentist and get verbal clearance.  TCT [facility] and spoke with  office assistant. Provided the need for pt's dental health in order to give him Zometa. She states she has not talked to him about that. Advised that I needed her to discuss the situation with the dentist, to get this dental clearance. She said she would and will call back before 3 pm  Pt needs refill of MS Contin 30 mg 1 q12 hours. He has just 1 left from prescription s/p hospital  discharge. He uses Product/process development scientist @ [facility]

## 2022-02-27 NOTE — Telephone Encounter (Signed)
Transition Care Management Follow-up Telephone Call Date of discharge and from where: 02/24/2022, [facility] How have you been since you were released from the hospital? He stated he is feeling pretty good.  Any questions or concerns? No  Items Reviewed: Did the pt receive and understand the discharge instructions provided? Yes  Medications obtained and verified? Yes - he said he has all of his medications except one of the chemo meds. He has called the oncology office and is waiting for a call back.  Other? No  Any new allergies since your discharge? No  Dietary orders reviewed? Yes Do you have support at home? Yes   Home Care and Equipment/Supplies: Were home health services ordered? yes If so, what is the name of the agency? [facility]  Has the agency set up a time to come to the patient's home? He has not heard from [facility] yet.  I instructed him to call [facility], the phone number is on his AVS, to inquire when they will be seeing him  Were any new equipment or medical supplies ordered?  Yes: 3:1 [employer] What is the name of the medical supply agency? [facility] Were you able to get the supplies/equipment? yes Do you have any questions related to the use of the equipment or supplies? No  Functional Questionnaire: (I = Independent and D = Dependent) ADLs: He said he is ambulating without an assistive device.  He wears his TLSO when out of bed.  He said he is not wearing the sling for his left arm that often. Independent with personal care; but he said he has assistance available if needed.   Follow up appointments reviewed:  PCP Hospital f/u appt confirmed? Yes  Scheduled to see Dr Redmond Pulling - 03/17/2022.  [facility] f/u appt confirmed? Yes  He has multiple appointments scheduled with oncology.   Are transportation arrangements needed? No  If their condition worsens, is the pt aware to call PCP or go to the Emergency Dept.? Yes Was the patient provided with contact  information for the PCP's office or ED? Yes Was to pt encouraged to call back with questions or concerns? Yes

## 2022-02-27 NOTE — Telephone Encounter (Signed)
Refill of MS contin requested

## 2022-02-27 NOTE — Telephone Encounter (Signed)
Received call back from [facility], [employer] She states she talked to Dr. Everlean Cherry, DDS about dental clearance. He states the pt's Xrays are fine but since he had a tooth extracted on 02/25/22, he would prefer we wait 4-6 weeks before initiating Zometa therapy.  He had no concerns after that time frame.  Dr. Lorenso Courier made aware.

## 2022-02-27 NOTE — Telephone Encounter (Signed)
Medication Prior Authorization Status  Processed CoverMyMeds KEY: [id number] Morphine Sulfate ER 30 mg  Approved Today  Per [employer] [place] RX #: [id number]   Effective 02/27/2022 through 02/25/2022.

## 2022-02-28 ENCOUNTER — Other Ambulatory Visit: Payer: Self-pay

## 2022-02-28 ENCOUNTER — Telehealth: Payer: Self-pay | Admitting: *Deleted

## 2022-02-28 ENCOUNTER — Other Ambulatory Visit: Payer: Self-pay | Admitting: Hematology and Oncology

## 2022-02-28 MED ORDER — MORPHINE SULFATE ER 30 MG PO TBCR
60.0000 mg | EXTENDED_RELEASE_TABLET | Freq: Two times a day (BID) | ORAL | 0 refills | Status: DC
  Filled 2022-02-28: qty 120, 30d supply, fill #0

## 2022-02-28 NOTE — Telephone Encounter (Signed)
Received call from pt regarding dental clearance. Advised that we do have his dental clearance but that we cannot give the Zometa for 4-6 weeks after having his tooth pulled. He also asked if the MS Contin refill had been sent in.  Advised that Dr. Lorenso Courier refilled the original order which was for 8 tablets-good for 4 days. But that I have notified Dr. Lorenso Courier that [place] will need additional tablets. Not just 4 days worth. [name] voiced understanding. He states his back is still pretty painful, even after the Kyphoplasty,  Dr. Lorenso Courier made aware.

## 2022-03-01 DIAGNOSIS — Z79891 Long term (current) use of opiate analgesic: Secondary | ICD-10-CM | POA: Diagnosis not present

## 2022-03-01 DIAGNOSIS — E871 Hypo-osmolality and hyponatremia: Secondary | ICD-10-CM | POA: Diagnosis not present

## 2022-03-01 DIAGNOSIS — Z9181 History of falling: Secondary | ICD-10-CM | POA: Diagnosis not present

## 2022-03-01 DIAGNOSIS — E785 Hyperlipidemia, unspecified: Secondary | ICD-10-CM | POA: Diagnosis not present

## 2022-03-01 DIAGNOSIS — C9000 Multiple myeloma not having achieved remission: Secondary | ICD-10-CM | POA: Diagnosis not present

## 2022-03-01 DIAGNOSIS — E669 Obesity, unspecified: Secondary | ICD-10-CM | POA: Diagnosis not present

## 2022-03-01 DIAGNOSIS — Z7982 Long term (current) use of aspirin: Secondary | ICD-10-CM | POA: Diagnosis not present

## 2022-03-01 DIAGNOSIS — M8458XD Pathological fracture in neoplastic disease, other specified site, subsequent encounter for fracture with routine healing: Secondary | ICD-10-CM | POA: Diagnosis not present

## 2022-03-01 DIAGNOSIS — K219 Gastro-esophageal reflux disease without esophagitis: Secondary | ICD-10-CM | POA: Diagnosis not present

## 2022-03-01 DIAGNOSIS — I10 Essential (primary) hypertension: Secondary | ICD-10-CM | POA: Diagnosis not present

## 2022-03-01 DIAGNOSIS — M84522D Pathological fracture in neoplastic disease, left humerus, subsequent encounter for fracture with routine healing: Secondary | ICD-10-CM | POA: Diagnosis not present

## 2022-03-01 DIAGNOSIS — Z87891 Personal history of nicotine dependence: Secondary | ICD-10-CM | POA: Diagnosis not present

## 2022-03-01 DIAGNOSIS — Z6836 Body mass index (BMI) 36.0-36.9, adult: Secondary | ICD-10-CM | POA: Diagnosis not present

## 2022-03-01 DIAGNOSIS — D630 Anemia in neoplastic disease: Secondary | ICD-10-CM | POA: Diagnosis not present

## 2022-03-03 ENCOUNTER — Other Ambulatory Visit (HOSPITAL_COMMUNITY): Payer: Self-pay

## 2022-03-03 ENCOUNTER — Inpatient Hospital Stay: Payer: BC Managed Care – PPO

## 2022-03-03 ENCOUNTER — Other Ambulatory Visit: Payer: Self-pay

## 2022-03-03 ENCOUNTER — Ambulatory Visit (HOSPITAL_BASED_OUTPATIENT_CLINIC_OR_DEPARTMENT_OTHER): Payer: BC Managed Care – PPO | Admitting: Physician Assistant

## 2022-03-03 ENCOUNTER — Telehealth: Payer: Self-pay | Admitting: *Deleted

## 2022-03-03 VITALS — BP 124/77 | HR 64 | Temp 98.4°F | Resp 16 | Wt 245.5 lb

## 2022-03-03 DIAGNOSIS — Z87891 Personal history of nicotine dependence: Secondary | ICD-10-CM | POA: Diagnosis not present

## 2022-03-03 DIAGNOSIS — C9000 Multiple myeloma not having achieved remission: Secondary | ICD-10-CM

## 2022-03-03 DIAGNOSIS — M84522D Pathological fracture in neoplastic disease, left humerus, subsequent encounter for fracture with routine healing: Secondary | ICD-10-CM | POA: Diagnosis not present

## 2022-03-03 DIAGNOSIS — M8458XD Pathological fracture in neoplastic disease, other specified site, subsequent encounter for fracture with routine healing: Secondary | ICD-10-CM | POA: Diagnosis not present

## 2022-03-03 DIAGNOSIS — K5900 Constipation, unspecified: Secondary | ICD-10-CM | POA: Diagnosis not present

## 2022-03-03 DIAGNOSIS — Z6836 Body mass index (BMI) 36.0-36.9, adult: Secondary | ICD-10-CM | POA: Diagnosis not present

## 2022-03-03 DIAGNOSIS — K449 Diaphragmatic hernia without obstruction or gangrene: Secondary | ICD-10-CM | POA: Diagnosis not present

## 2022-03-03 DIAGNOSIS — D630 Anemia in neoplastic disease: Secondary | ICD-10-CM | POA: Diagnosis not present

## 2022-03-03 DIAGNOSIS — Z7982 Long term (current) use of aspirin: Secondary | ICD-10-CM | POA: Diagnosis not present

## 2022-03-03 DIAGNOSIS — Z9181 History of falling: Secondary | ICD-10-CM | POA: Diagnosis not present

## 2022-03-03 DIAGNOSIS — E669 Obesity, unspecified: Secondary | ICD-10-CM | POA: Diagnosis not present

## 2022-03-03 DIAGNOSIS — E785 Hyperlipidemia, unspecified: Secondary | ICD-10-CM | POA: Diagnosis not present

## 2022-03-03 DIAGNOSIS — Z7961 Long term (current) use of immunomodulator: Secondary | ICD-10-CM | POA: Diagnosis not present

## 2022-03-03 DIAGNOSIS — Z800 Family history of malignant neoplasm of digestive organs: Secondary | ICD-10-CM | POA: Diagnosis not present

## 2022-03-03 DIAGNOSIS — M4316 Spondylolisthesis, lumbar region: Secondary | ICD-10-CM | POA: Diagnosis not present

## 2022-03-03 DIAGNOSIS — M4856XA Collapsed vertebra, not elsewhere classified, lumbar region, initial encounter for fracture: Secondary | ICD-10-CM | POA: Diagnosis not present

## 2022-03-03 DIAGNOSIS — I10 Essential (primary) hypertension: Secondary | ICD-10-CM | POA: Diagnosis not present

## 2022-03-03 DIAGNOSIS — M47816 Spondylosis without myelopathy or radiculopathy, lumbar region: Secondary | ICD-10-CM | POA: Diagnosis not present

## 2022-03-03 DIAGNOSIS — M4802 Spinal stenosis, cervical region: Secondary | ICD-10-CM | POA: Diagnosis not present

## 2022-03-03 DIAGNOSIS — K219 Gastro-esophageal reflux disease without esophagitis: Secondary | ICD-10-CM | POA: Diagnosis not present

## 2022-03-03 DIAGNOSIS — Z79891 Long term (current) use of opiate analgesic: Secondary | ICD-10-CM | POA: Diagnosis not present

## 2022-03-03 DIAGNOSIS — E871 Hypo-osmolality and hyponatremia: Secondary | ICD-10-CM | POA: Diagnosis not present

## 2022-03-03 DIAGNOSIS — Z5112 Encounter for antineoplastic immunotherapy: Secondary | ICD-10-CM | POA: Diagnosis not present

## 2022-03-03 DIAGNOSIS — F17210 Nicotine dependence, cigarettes, uncomplicated: Secondary | ICD-10-CM | POA: Diagnosis not present

## 2022-03-03 DIAGNOSIS — Z79899 Other long term (current) drug therapy: Secondary | ICD-10-CM | POA: Diagnosis not present

## 2022-03-03 DIAGNOSIS — Z8719 Personal history of other diseases of the digestive system: Secondary | ICD-10-CM | POA: Diagnosis not present

## 2022-03-03 NOTE — Telephone Encounter (Signed)
Received call from pt stating that he is experiencing a new abdominal type pain that radiates to his back. He is concerned that something else may be going on. He has had serious experiences with sudden pain-compression fx of spine and also his upper arm.   He is not sure if it is related to having frequent bowel movements or not. His BM's are liquid in nature as he is taking Miralax while taking opioids. Advised that he could be seen in our Symptom Management clinic today as Dr. Libby Maw schedule is full. He is agreeable to this and could be here by 2pm. He has to wait for his sister to pick him up.

## 2022-03-03 NOTE — Progress Notes (Signed)
Labs entered for [facility] visit.  

## 2022-03-03 NOTE — Progress Notes (Signed)
Symptom Management Consult note [place]    Patient Care Team: [name], MD as PCP - General (Family Medicine) Lorretta Harp, MD as PCP - Cardiology (Cardiology) Gala Romney Cristopher Estimable, MD as Attending Physician (Gastroenterology)    Name of the patient: [name]  [id number]  [birth year]   Date of visit: 03/03/2022   Chief Complaint/Reason for visit: abdominal pain   Current Therapy: VRD  Last treatment:  Day 1   Cycle 1 on 02/26/22   ASSESSMENT & PLAN: Patient is a 51 y.o. male  with oncologic history of IgG kappa multiple myeloma followed by Dr. Lorenso Courier.  I have viewed most recent oncology note and lab work.    #) IgG kappa multiple myeloma -Next treatment scheduled for 03/05/22 - Next appointment with oncologist is 03/12/22. -Scheduled 10/18 to establish care with palliative care team.  #)Constipation -Patient well appearing. Afebrile, HDS. No infectious symptoms. -Benign abdominal exam. Patient ambulatory without assistance. Neuro intact. -Discussed OTC constipation management including Miralax and colace which he has been taking sporadically.  -HPI suggestive of stool impaction. With multiple bowel movements today and abdominal pain resolving prior to clinic visit SBO less likely. -Engaged in shared decision making with patient. He would like to hold off on work up at this time and try OTC management. He knows if symptoms not improving by mid week he should return to clinic for repeat abdominal exam and further work up. Would consider KUB. -Strict ED precautions discussed should symptoms worsen.        Heme/Onc History: Oncology History  Multiple myeloma not having achieved remission ([place])  02/16/2022 Initial Diagnosis   Multiple myeloma not having achieved remission ([place])   02/26/2022 -  Chemotherapy   Patient is on Treatment Plan : MYELOMA  RVD SQ q21d x 4 cycles         Interval history-: [name] is a 51 y.o. male  with oncologic history as above presenting to [facility] today with chief complaint of abdominal pain and diarrhea. He is accompanied by his sister today who provides additional history.  Patient reports ongoing constipation x 3 weeks. He has had several small bowel movement that consist of hard round small stool. Today he had 3 episodes of diarrhea that he describes as brown in color and loose consistency, denies any blood. He reports getting to the bathroom quickly and spending long stretches of time seated on the toilet might have caused him to tense up and hurt his back. He describes dull aching pain in his back that radiated to his lower abdomen. Pain started after wiping himself. Pain was 5/10 in severity and worse with movement. The pain made him feel nauseas. He took zofran 600 mg ibuprofen, 500 mg, tylenol and morphine which resolved nausea and pain. He denies any fall or injury. He has been complaint wearing his brace. He denies any fever, chills, numbness, tingling, weakness, emesis, urinary symptoms.     ROS  All other systems are reviewed and are negative for acute change except as noted in the HPI.    No Known Allergies   Past Medical History:  Diagnosis Date   Allergy    Anemia    Atypical chest pain    Cancer (HCC)    Multiple myeloma with normocytic anemia   GERD (gastroesophageal reflux disease)    Hepatic cyst    Benign by MRI   Hiatal hernia    History of colonic polyps  Hyperlipidemia    Hypertension    Rectal bleeding    Tobacco abuse      Past Surgical History:  Procedure Laterality Date   COLONOSCOPY  01/10/2009     RMR: Normal rectum, normal colon; repeat in 2015 due to [place] of colon cancer   COLONOSCOPY N/A 01/09/2014   CMK:LKJZPHXT colonic polyps-removed as described   COLONOSCOPY N/A 09/18/2016   Procedure: COLONOSCOPY;  Surgeon: Daneil Dolin, MD;  [facility];  Service: Endoscopy;  Laterality: N/A;  730    ESOPHAGOGASTRODUODENOSCOPY   10/04/2007   RMR: Distal esophageal erosions consistent with erosive reflux esophagitis, patulous gastroesophageal junction status post passage of a  [place] dilator, 69 [place].  Otherwise, unremarkable esophagus.  Hiatal hernia.  Otherwise normal stomach.  Bulbar erosion   ESOPHAGOGASTRODUODENOSCOPY N/A 01/09/2014   Erosive reflux esophagitis. Small hiatal hernia   HUMERUS IM NAIL Left 02/22/2022   Procedure: INTRAMEDULLARY (IM) NAIL HUMERAL;  Surgeon: Nicholes Stairs, MD;  Location: [facility];  Service: Orthopedics;  Laterality: Left;   I & D EXTREMITY Left 07/26/2018   Procedure: IRRIGATION AND DEBRIDEMENT EXTREMITY;  Surgeon: Dayna Barker, MD;  Location: [facility];  Service: Plastics;  Laterality: Left;   IR BONE TUMOR(S)RF ABLATION  02/05/2022   IR BONE TUMOR(S)RF ABLATION  02/24/2022   IR KYPHO EA ADDL LEVEL THORACIC OR LUMBAR  02/19/2022   IR KYPHO LUMBAR INC FX REDUCE BONE BX UNI/BIL CANNULATION INC/IMAGING  02/05/2022   IR KYPHO LUMBAR INC FX REDUCE BONE BX UNI/BIL CANNULATION INC/IMAGING  02/19/2022   PERCUTANEOUS PINNING Left 07/26/2018   Procedure: PERCUTANEOUS PINNING EXTREMITY;  Surgeon: Dayna Barker, MD;  Location: [facility];  Service: Plastics;  Laterality: Left;    Social History   Socioeconomic History   Marital status: Single    Spouse name: Not on file   Number of children: 2   Years of education: Not on file   Highest education level: Not on file  Occupational History   Occupation: Scientist, 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   Smoking status: Every Day    Packs/day: 0.25    Years: 10.00    Total pack years: 2.50    Types: Cigarettes   Smokeless tobacco: Never  Vaping Use   Vaping Use: Never used  Substance and Sexual Activity   Alcohol use: Yes    Alcohol/week: 0.0 standard drinks of alcohol    Comment: occasional/wine on the weekends   Drug use: No   Sexual activity: Not on file  Other Topics Concern   Not on  file  Social History Narrative   Right handed   Lives alone one story home   Social Determinants of Health   Financial Resource Strain: Not on file  Food Insecurity: No Food Insecurity (02/18/2022)   Hunger Vital Sign    Worried About Running Out of Food in the Last Year: Never true    Ran Out of Food in the Last Year: Never true  Transportation Needs: No Transportation Needs (02/18/2022)   PRAPARE - Hydrologist (Medical): No    Lack of Transportation (Non-Medical): No  Physical Activity: Not on file  Stress: Not on file  Social Connections: Not on file  Intimate Partner Violence: Not At Risk (02/18/2022)   Humiliation, Afraid, Rape, and Kick questionnaire    Fear of Current or Ex-Partner: No    Emotionally Abused: No  Physically Abused: No    Sexually Abused: No    Family History  Problem Relation Age of Onset   Heart disease Mother    Hypertension Mother    Diabetes Father    Hypertension Father    Colon cancer Sister        passed away from colon cancer, in her 16s   Heart attack Maternal Uncle    Prostate cancer Maternal Uncle    Diabetes Other    Pancreatic cancer Neg Hx    Rectal cancer Neg Hx    Esophageal cancer Neg Hx    Stomach cancer Neg Hx      Current Outpatient Medications:    acetaminophen (TYLENOL) 500 MG tablet, Take 1 tablet (500 mg total) by mouth every 8 (eight) hours as needed for mild pain or moderate pain., Disp: 30 tablet, Rfl: 0   acyclovir (ZOVIRAX) 400 MG tablet, Take 1 tablet (400 mg total) by mouth 2 (two) times daily., Disp: 60 tablet, Rfl: 3   allopurinol (ZYLOPRIM) 300 MG tablet, Take 1 tablet (300 mg total) by mouth daily., Disp: 30 tablet, Rfl: 3   aspirin EC 81 MG tablet, Take 81 mg by mouth daily. Swallow whole., Disp: , Rfl:    docusate sodium (COLACE) 100 MG capsule, Take 1 capsule (100 mg total) by mouth 2 (two) times daily as needed for mild constipation., Disp: 25 capsule, Rfl: 0    hydrochlorothiazide (HYDRODIURIL) 25 MG tablet, Take 1 tablet (25 mg total) by mouth daily., Disp: 90 tablet, Rfl: 1   ibuprofen (ADVIL) 600 MG tablet, Take 1 tablet (600 mg total) by mouth every 6 (six) hours as needed. (Patient taking differently: Take 600 mg by mouth every 6 (six) hours as needed for mild pain or moderate pain.), Disp: 30 tablet, Rfl: 0   lenalidomide (REVLIMID) 25 MG capsule, Take 1 capsule (25 mg total) by mouth daily. Take for 14 days and then none for 7 days. Repeat every 21 days. [employer] Auth # [id number] Date Obtained 02/13/22, Disp: 14 capsule, Rfl: 0   lisinopril (ZESTRIL) 40 MG tablet, Take 1 tablet (40 mg total) by mouth daily., Disp: 90 tablet, Rfl: 1   metoprolol tartrate (LOPRESSOR) 50 MG tablet, Take 1 tablet (50 mg total) by mouth 2 (two) times daily., Disp: 60 tablet, Rfl: 0   morphine (MS CONTIN) 30 MG 12 hr tablet, Take 2 tablets (60 mg total) by mouth every 12 (twelve) hours., Disp: 120 tablet, Rfl: 0   ondansetron (ZOFRAN) 8 MG tablet, Take 1 tablet (8 mg total) by mouth every 8 (eight) hours as needed for nausea or vomiting., Disp: 60 tablet, Rfl: 0   oxyCODONE (ROXICODONE) 15 MG immediate release tablet, Take 1 tablet (15 mg total) by mouth every 8 (eight) hours as needed for severe pain or breakthrough pain., Disp: 20 tablet, Rfl: 0   pantoprazole (PROTONIX) 40 MG tablet, Take 1 tablet (40 mg total) by mouth daily., Disp: 90 tablet, Rfl: 1   polyethylene glycol powder (GLYCOLAX/MIRALAX) 17 GM/SCOOP powder, Take 1 capful (17 g) with water by mouth daily., Disp: 238 g, Rfl: 0   prochlorperazine (COMPAZINE) 10 MG tablet, Take 1 tablet (10 mg total) by mouth every 6 (six) hours as needed for nausea or vomiting., Disp: 60 tablet, Rfl: 0  PHYSICAL EXAM: ECOG FS:1 - Symptomatic but completely ambulatory    Vitals:   03/03/22 1357  BP: 124/77  Pulse: 64  Resp: 16  Temp: 98.4 F (36.9 C)  TempSrc: Oral  SpO2: 99%  Weight: 245 lb 8 oz (111.4 kg)   Physical  Exam Vitals and nursing note reviewed.  Constitutional:      Appearance: He is well-developed. He is not ill-appearing or toxic-appearing.     Comments: Wearing TLSO brace  HENT:     Head: Normocephalic.     Nose: Nose normal.  Eyes:     Conjunctiva/sclera: Conjunctivae normal.  Neck:     Vascular: No JVD.  Cardiovascular:     Rate and Rhythm: Normal rate and regular rhythm.     Pulses: Normal pulses.     Heart sounds: Normal heart sounds.  Pulmonary:     Effort: Pulmonary effort is normal.     Breath sounds: Normal breath sounds.  Abdominal:     General: Bowel sounds are normal. There is no distension.     Palpations: Abdomen is soft. There is no mass.     Tenderness: There is no abdominal tenderness. There is no guarding or rebound.     Hernia: No hernia is present.  Musculoskeletal:     Cervical back: Normal range of motion.  Skin:    General: Skin is warm and dry.  Neurological:     Mental Status: He is oriented to person, place, and time.     Comments: Ambulatory with steady gait. 5/5 strength and sensation intact in lower extremities bilaterally.  No saddle anesthesia.         LABORATORY DATA: I have reviewed the data as listed    Latest Ref Rng & Units 02/26/2022    9:44 AM 02/23/2022    5:40 AM 02/22/2022    6:40 AM  CBC  WBC 4.0 - 10.5 K/uL 9.0  8.1  5.6   Hemoglobin 13.0 - 17.0 g/dL 10.7  10.2  10.8   Hematocrit 39.0 - 52.0 % 32.6  30.7  32.4   Platelets 150 - 400 K/uL 242  210  203         Latest Ref Rng & Units 02/26/2022    9:44 AM 02/24/2022    3:18 AM 02/23/2022    5:40 AM  CMP  Glucose 70 - 99 mg/dL 115  111  116   BUN 6 - 20 mg/dL $Remove'21  14  23   'xFwchcm$ Creatinine 0.61 - 1.24 mg/dL 1.18  1.07  1.19   Sodium 135 - 145 mmol/L 130  131  131   Potassium 3.5 - 5.1 mmol/L 4.3  4.3  3.8   Chloride 98 - 111 mmol/L 97  96  100   CO2 22 - 32 mmol/L $RemoveB'27  25  24   'CeHpVVcY$ Calcium 8.9 - 10.3 mg/dL 10.6  9.9  9.4   Total Protein 6.5 - 8.1 g/dL 11.2     Total Bilirubin  0.3 - 1.2 mg/dL 0.5     Alkaline Phos 38 - 126 U/L 58     AST 15 - 41 U/L 23     ALT 0 - 44 U/L 21          RADIOGRAPHIC STUDIES (from last 24 hours if applicable) I have personally reviewed the radiological images as listed and agreed with the findings in the report. No results found.      Visit Diagnosis: 1. Multiple myeloma not having achieved remission ([place])   2. Constipation, unspecified constipation type      No orders of the defined types were placed in this encounter.   All questions were answered. The patient knows to call the  clinic with any problems, questions or concerns. No barriers to learning was detected.  I have spent a total of 20 minutes minutes of face-to-face and non-face-to-face time, preparing to see the patient, obtaining and/or reviewing separately obtained history, performing a medically appropriate examination, counseling and educating the patient, documenting clinical information in the electronic health record, and care coordination (communications with other health care professionals or caregivers).    Thank you for allowing me to participate in the care of this patient.    Barrie Folk, PA-C Department of Hematology/Oncology [facility] Phone: [phone]  [phone]    03/03/2022 5:18 PM

## 2022-03-04 DIAGNOSIS — K219 Gastro-esophageal reflux disease without esophagitis: Secondary | ICD-10-CM | POA: Diagnosis not present

## 2022-03-04 DIAGNOSIS — Z7982 Long term (current) use of aspirin: Secondary | ICD-10-CM | POA: Diagnosis not present

## 2022-03-04 DIAGNOSIS — Z79891 Long term (current) use of opiate analgesic: Secondary | ICD-10-CM | POA: Diagnosis not present

## 2022-03-04 DIAGNOSIS — M8458XD Pathological fracture in neoplastic disease, other specified site, subsequent encounter for fracture with routine healing: Secondary | ICD-10-CM | POA: Diagnosis not present

## 2022-03-04 DIAGNOSIS — Z6836 Body mass index (BMI) 36.0-36.9, adult: Secondary | ICD-10-CM | POA: Diagnosis not present

## 2022-03-04 DIAGNOSIS — I10 Essential (primary) hypertension: Secondary | ICD-10-CM | POA: Diagnosis not present

## 2022-03-04 DIAGNOSIS — E785 Hyperlipidemia, unspecified: Secondary | ICD-10-CM | POA: Diagnosis not present

## 2022-03-04 DIAGNOSIS — C9000 Multiple myeloma not having achieved remission: Secondary | ICD-10-CM | POA: Diagnosis not present

## 2022-03-04 DIAGNOSIS — E871 Hypo-osmolality and hyponatremia: Secondary | ICD-10-CM | POA: Diagnosis not present

## 2022-03-04 DIAGNOSIS — D630 Anemia in neoplastic disease: Secondary | ICD-10-CM | POA: Diagnosis not present

## 2022-03-04 DIAGNOSIS — E669 Obesity, unspecified: Secondary | ICD-10-CM | POA: Diagnosis not present

## 2022-03-04 DIAGNOSIS — Z87891 Personal history of nicotine dependence: Secondary | ICD-10-CM | POA: Diagnosis not present

## 2022-03-04 DIAGNOSIS — M84522D Pathological fracture in neoplastic disease, left humerus, subsequent encounter for fracture with routine healing: Secondary | ICD-10-CM | POA: Diagnosis not present

## 2022-03-04 DIAGNOSIS — Z9181 History of falling: Secondary | ICD-10-CM | POA: Diagnosis not present

## 2022-03-05 ENCOUNTER — Inpatient Hospital Stay: Payer: BC Managed Care – PPO

## 2022-03-05 ENCOUNTER — Other Ambulatory Visit: Payer: Self-pay

## 2022-03-05 ENCOUNTER — Other Ambulatory Visit: Payer: Self-pay | Admitting: *Deleted

## 2022-03-05 VITALS — BP 111/63 | HR 68 | Temp 98.4°F | Resp 18 | Wt 248.8 lb

## 2022-03-05 DIAGNOSIS — M109 Gout, unspecified: Secondary | ICD-10-CM | POA: Diagnosis not present

## 2022-03-05 DIAGNOSIS — N179 Acute kidney failure, unspecified: Secondary | ICD-10-CM | POA: Diagnosis not present

## 2022-03-05 DIAGNOSIS — R531 Weakness: Secondary | ICD-10-CM | POA: Diagnosis not present

## 2022-03-05 DIAGNOSIS — I288 Other diseases of pulmonary vessels: Secondary | ICD-10-CM | POA: Diagnosis not present

## 2022-03-05 DIAGNOSIS — C9000 Multiple myeloma not having achieved remission: Secondary | ICD-10-CM | POA: Diagnosis not present

## 2022-03-05 DIAGNOSIS — K219 Gastro-esophageal reflux disease without esophagitis: Secondary | ICD-10-CM | POA: Diagnosis not present

## 2022-03-05 DIAGNOSIS — M8458XA Pathological fracture in neoplastic disease, other specified site, initial encounter for fracture: Secondary | ICD-10-CM | POA: Diagnosis not present

## 2022-03-05 DIAGNOSIS — D649 Anemia, unspecified: Secondary | ICD-10-CM | POA: Diagnosis not present

## 2022-03-05 DIAGNOSIS — Z87891 Personal history of nicotine dependence: Secondary | ICD-10-CM | POA: Diagnosis not present

## 2022-03-05 DIAGNOSIS — M8458XD Pathological fracture in neoplastic disease, other specified site, subsequent encounter for fracture with routine healing: Secondary | ICD-10-CM | POA: Diagnosis not present

## 2022-03-05 DIAGNOSIS — M5459 Other low back pain: Secondary | ICD-10-CM | POA: Diagnosis not present

## 2022-03-05 DIAGNOSIS — Z7982 Long term (current) use of aspirin: Secondary | ICD-10-CM | POA: Diagnosis not present

## 2022-03-05 DIAGNOSIS — M545 Low back pain, unspecified: Secondary | ICD-10-CM | POA: Diagnosis not present

## 2022-03-05 DIAGNOSIS — M549 Dorsalgia, unspecified: Secondary | ICD-10-CM | POA: Diagnosis not present

## 2022-03-05 DIAGNOSIS — S42202D Unspecified fracture of upper end of left humerus, subsequent encounter for fracture with routine healing: Secondary | ICD-10-CM | POA: Diagnosis not present

## 2022-03-05 DIAGNOSIS — D630 Anemia in neoplastic disease: Secondary | ICD-10-CM | POA: Diagnosis not present

## 2022-03-05 DIAGNOSIS — M84422S Pathological fracture, left humerus, sequela: Secondary | ICD-10-CM | POA: Diagnosis not present

## 2022-03-05 DIAGNOSIS — R2689 Other abnormalities of gait and mobility: Secondary | ICD-10-CM | POA: Diagnosis not present

## 2022-03-05 DIAGNOSIS — I10 Essential (primary) hypertension: Secondary | ICD-10-CM | POA: Diagnosis not present

## 2022-03-05 DIAGNOSIS — E871 Hypo-osmolality and hyponatremia: Secondary | ICD-10-CM | POA: Diagnosis not present

## 2022-03-05 DIAGNOSIS — Z515 Encounter for palliative care: Secondary | ICD-10-CM | POA: Diagnosis not present

## 2022-03-05 DIAGNOSIS — Z6836 Body mass index (BMI) 36.0-36.9, adult: Secondary | ICD-10-CM | POA: Diagnosis not present

## 2022-03-05 DIAGNOSIS — S32010A Wedge compression fracture of first lumbar vertebra, initial encounter for closed fracture: Secondary | ICD-10-CM | POA: Diagnosis not present

## 2022-03-05 DIAGNOSIS — M6281 Muscle weakness (generalized): Secondary | ICD-10-CM | POA: Diagnosis not present

## 2022-03-05 DIAGNOSIS — Z79891 Long term (current) use of opiate analgesic: Secondary | ICD-10-CM | POA: Diagnosis not present

## 2022-03-05 DIAGNOSIS — M84522D Pathological fracture in neoplastic disease, left humerus, subsequent encounter for fracture with routine healing: Secondary | ICD-10-CM | POA: Diagnosis not present

## 2022-03-05 DIAGNOSIS — Z8249 Family history of ischemic heart disease and other diseases of the circulatory system: Secondary | ICD-10-CM | POA: Diagnosis not present

## 2022-03-05 DIAGNOSIS — Z79899 Other long term (current) drug therapy: Secondary | ICD-10-CM | POA: Diagnosis not present

## 2022-03-05 DIAGNOSIS — T402X5A Adverse effect of other opioids, initial encounter: Secondary | ICD-10-CM | POA: Diagnosis present

## 2022-03-05 DIAGNOSIS — Z9181 History of falling: Secondary | ICD-10-CM | POA: Diagnosis not present

## 2022-03-05 DIAGNOSIS — E669 Obesity, unspecified: Secondary | ICD-10-CM | POA: Diagnosis not present

## 2022-03-05 DIAGNOSIS — E785 Hyperlipidemia, unspecified: Secondary | ICD-10-CM | POA: Diagnosis not present

## 2022-03-05 DIAGNOSIS — S32000S Wedge compression fracture of unspecified lumbar vertebra, sequela: Secondary | ICD-10-CM | POA: Diagnosis not present

## 2022-03-05 DIAGNOSIS — F17210 Nicotine dependence, cigarettes, uncomplicated: Secondary | ICD-10-CM | POA: Diagnosis not present

## 2022-03-05 DIAGNOSIS — G8929 Other chronic pain: Secondary | ICD-10-CM | POA: Diagnosis not present

## 2022-03-05 DIAGNOSIS — Z23 Encounter for immunization: Secondary | ICD-10-CM | POA: Diagnosis not present

## 2022-03-05 DIAGNOSIS — M25512 Pain in left shoulder: Secondary | ICD-10-CM | POA: Diagnosis not present

## 2022-03-05 DIAGNOSIS — Z7401 Bed confinement status: Secondary | ICD-10-CM | POA: Diagnosis not present

## 2022-03-05 DIAGNOSIS — K5903 Drug induced constipation: Secondary | ICD-10-CM | POA: Diagnosis not present

## 2022-03-05 DIAGNOSIS — E782 Mixed hyperlipidemia: Secondary | ICD-10-CM | POA: Diagnosis not present

## 2022-03-05 DIAGNOSIS — M62838 Other muscle spasm: Secondary | ICD-10-CM | POA: Diagnosis not present

## 2022-03-05 DIAGNOSIS — D8481 Immunodeficiency due to conditions classified elsewhere: Secondary | ICD-10-CM | POA: Diagnosis not present

## 2022-03-05 DIAGNOSIS — E8809 Other disorders of plasma-protein metabolism, not elsewhere classified: Secondary | ICD-10-CM | POA: Diagnosis not present

## 2022-03-05 DIAGNOSIS — M4802 Spinal stenosis, cervical region: Secondary | ICD-10-CM | POA: Diagnosis not present

## 2022-03-05 LAB — CBC WITH DIFFERENTIAL (CANCER CENTER ONLY)
Abs Immature Granulocytes: 0.09 10*3/uL — ABNORMAL HIGH (ref 0.00–0.07)
Basophils Absolute: 0 10*3/uL (ref 0.0–0.1)
Basophils Relative: 0 %
Eosinophils Absolute: 0.1 10*3/uL (ref 0.0–0.5)
Eosinophils Relative: 2 %
HCT: 28.6 % — ABNORMAL LOW (ref 39.0–52.0)
Hemoglobin: 9.4 g/dL — ABNORMAL LOW (ref 13.0–17.0)
Immature Granulocytes: 2 %
Lymphocytes Relative: 33 %
Lymphs Abs: 1.7 10*3/uL (ref 0.7–4.0)
MCH: 29.7 pg (ref 26.0–34.0)
MCHC: 32.9 g/dL (ref 30.0–36.0)
MCV: 90.5 fL (ref 80.0–100.0)
Monocytes Absolute: 0.3 10*3/uL (ref 0.1–1.0)
Monocytes Relative: 6 %
Neutro Abs: 2.9 10*3/uL (ref 1.7–7.7)
Neutrophils Relative %: 57 %
Platelet Count: 235 10*3/uL (ref 150–400)
RBC: 3.16 MIL/uL — ABNORMAL LOW (ref 4.22–5.81)
RDW: 12.9 % (ref 11.5–15.5)
WBC Count: 5.1 10*3/uL (ref 4.0–10.5)
nRBC: 0.4 % — ABNORMAL HIGH (ref 0.0–0.2)

## 2022-03-05 LAB — CMP (CANCER CENTER ONLY)
ALT: 26 U/L (ref 0–44)
AST: 18 U/L (ref 15–41)
Albumin: 3.2 g/dL — ABNORMAL LOW (ref 3.5–5.0)
Alkaline Phosphatase: 64 U/L (ref 38–126)
Anion gap: 5 (ref 5–15)
BUN: 20 mg/dL (ref 6–20)
CO2: 25 mmol/L (ref 22–32)
Calcium: 9.4 mg/dL (ref 8.9–10.3)
Chloride: 103 mmol/L (ref 98–111)
Creatinine: 1.17 mg/dL (ref 0.61–1.24)
GFR, Estimated: >60 mL/min (ref >60)
Glucose, Bld: 100 mg/dL — ABNORMAL HIGH (ref 70–99)
Potassium: 4.3 mmol/L (ref 3.5–5.1)
Sodium: 133 mmol/L — ABNORMAL LOW (ref 135–145)
Total Bilirubin: 0.3 mg/dL (ref 0.3–1.2)
Total Protein: 10.3 g/dL — ABNORMAL HIGH (ref 6.5–8.1)

## 2022-03-05 MED ORDER — LENALIDOMIDE 25 MG PO CAPS: 25.0000 mg | ORAL_CAPSULE | Freq: Every day | ORAL | 0 refills | Status: DC

## 2022-03-05 MED ORDER — BORTEZOMIB CHEMO SQ INJECTION 3.5 MG (2.5MG/ML)
1.3000 mg/m2 | Freq: Once | INTRAMUSCULAR | Status: AC
  Administered 2022-03-05: 3 mg via SUBCUTANEOUS
  Filled 2022-03-05: qty 1.2

## 2022-03-05 MED ORDER — DEXAMETHASONE 4 MG PO TABS
40.0000 mg | ORAL_TABLET | Freq: Once | ORAL | Status: AC
  Administered 2022-03-05: 40 mg via ORAL
  Filled 2022-03-05: qty 10

## 2022-03-05 NOTE — Patient Instructions (Signed)
[name] ONCOLOGY  Discharge Instructions: Thank you for choosing [facility] to provide your oncology and hematology care.   If you have a lab appointment with the [facility], please go directly to the [facility] and check in at the registration area.   Wear comfortable clothing and clothing appropriate for easy access to any Portacath or PICC line.   We strive to give you quality time with your provider. You may need to reschedule your appointment if you arrive late (15 or more minutes).  Arriving late affects you and other patients whose appointments are after yours.  Also, if you miss three or more appointments without notifying the office, you may be dismissed from the clinic at the provider's discretion.      For prescription refill requests, have your pharmacy contact our office and allow 72 hours for refills to be completed.    Today you received the following chemotherapy and/or immunotherapy agents : Bortezomib.      To help prevent nausea and vomiting after your treatment, we encourage you to take your nausea medication as directed.  BELOW ARE SYMPTOMS THAT SHOULD BE REPORTED IMMEDIATELY: *FEVER GREATER THAN 100.4 F (38 C) OR HIGHER *CHILLS OR SWEATING *NAUSEA AND VOMITING THAT IS NOT CONTROLLED WITH YOUR NAUSEA MEDICATION *UNUSUAL SHORTNESS OF BREATH *UNUSUAL BRUISING OR BLEEDING *URINARY PROBLEMS (pain or burning when urinating, or frequent urination) *BOWEL PROBLEMS (unusual diarrhea, constipation, pain near the anus) TENDERNESS IN MOUTH AND THROAT WITH OR WITHOUT PRESENCE OF ULCERS (sore throat, sores in mouth, or a toothache) UNUSUAL RASH, SWELLING OR PAIN  UNUSUAL VAGINAL DISCHARGE OR ITCHING   Items with * indicate a potential emergency and should be followed up as soon as possible or go to the Emergency Department if any problems should occur.  Please show the CHEMOTHERAPY ALERT CARD or IMMUNOTHERAPY ALERT CARD at check-in  to the Emergency Department and triage nurse.  Should you have questions after your visit or need to cancel or reschedule your appointment, please contact [facility]  Dept: [phone]  and follow the prompts.  Office hours are 8:00 a.m. to 4:30 p.m. Monday - Friday. Please note that voicemails left after 4:00 p.m. may not be returned until the following business day.  We are closed weekends and major holidays. You have access to a nurse at all times for urgent questions. Please call the main number to the clinic Dept: [phone] and follow the prompts.   For any non-urgent questions, you may also contact your provider using MyChart. We now offer e-Visits for anyone 17 and older to request care online for non-urgent symptoms. For details visit [email or web address].   Also download the MyChart app! Go to the app store, search "MyChart", open the app, select , and log in with your MyChart username and password.  Masks are optional in the cancer centers. If you would like for your care team to wear a mask while they are taking care of you, please let them know. You may have one support [name] who is at least 53 years old accompany you for your appointments.

## 2022-03-06 ENCOUNTER — Encounter (HOSPITAL_COMMUNITY): Payer: Self-pay | Admitting: Emergency Medicine

## 2022-03-06 ENCOUNTER — Emergency Department (HOSPITAL_COMMUNITY)
Admission: EM | Admit: 2022-03-06 | Discharge: 2022-03-07 | Disposition: A | Discharge status: Home / Self Care | Payer: BC Managed Care – PPO | Source: Home / Self Care

## 2022-03-06 ENCOUNTER — Other Ambulatory Visit: Payer: Self-pay

## 2022-03-06 DIAGNOSIS — Z4789 Encounter for other orthopedic aftercare: Secondary | ICD-10-CM | POA: Insufficient documentation

## 2022-03-06 DIAGNOSIS — D630 Anemia in neoplastic disease: Secondary | ICD-10-CM | POA: Diagnosis not present

## 2022-03-06 DIAGNOSIS — Z79891 Long term (current) use of opiate analgesic: Secondary | ICD-10-CM | POA: Diagnosis not present

## 2022-03-06 DIAGNOSIS — M545 Low back pain, unspecified: Secondary | ICD-10-CM | POA: Insufficient documentation

## 2022-03-06 DIAGNOSIS — M546 Pain in thoracic spine: Secondary | ICD-10-CM | POA: Insufficient documentation

## 2022-03-06 DIAGNOSIS — Z5321 Procedure and treatment not carried out due to patient leaving prior to being seen by health care provider: Secondary | ICD-10-CM | POA: Insufficient documentation

## 2022-03-06 DIAGNOSIS — K219 Gastro-esophageal reflux disease without esophagitis: Secondary | ICD-10-CM | POA: Diagnosis not present

## 2022-03-06 DIAGNOSIS — E871 Hypo-osmolality and hyponatremia: Secondary | ICD-10-CM | POA: Diagnosis not present

## 2022-03-06 DIAGNOSIS — Z87891 Personal history of nicotine dependence: Secondary | ICD-10-CM | POA: Diagnosis not present

## 2022-03-06 DIAGNOSIS — Z6836 Body mass index (BMI) 36.0-36.9, adult: Secondary | ICD-10-CM | POA: Diagnosis not present

## 2022-03-06 DIAGNOSIS — M84522D Pathological fracture in neoplastic disease, left humerus, subsequent encounter for fracture with routine healing: Secondary | ICD-10-CM | POA: Diagnosis not present

## 2022-03-06 DIAGNOSIS — E669 Obesity, unspecified: Secondary | ICD-10-CM | POA: Diagnosis not present

## 2022-03-06 DIAGNOSIS — C9000 Multiple myeloma not having achieved remission: Secondary | ICD-10-CM | POA: Diagnosis not present

## 2022-03-06 DIAGNOSIS — Z9181 History of falling: Secondary | ICD-10-CM | POA: Diagnosis not present

## 2022-03-06 DIAGNOSIS — Z7982 Long term (current) use of aspirin: Secondary | ICD-10-CM | POA: Diagnosis not present

## 2022-03-06 DIAGNOSIS — E785 Hyperlipidemia, unspecified: Secondary | ICD-10-CM | POA: Diagnosis not present

## 2022-03-06 DIAGNOSIS — I10 Essential (primary) hypertension: Secondary | ICD-10-CM | POA: Diagnosis not present

## 2022-03-06 DIAGNOSIS — M8458XD Pathological fracture in neoplastic disease, other specified site, subsequent encounter for fracture with routine healing: Secondary | ICD-10-CM | POA: Diagnosis not present

## 2022-03-06 NOTE — ED Triage Notes (Signed)
Patient reports low/mid back pain today , denies injury or fall , no dysuria or fever .

## 2022-03-06 NOTE — ED Provider Triage Note (Signed)
Emergency Medicine Provider Triage Evaluation Note  [name] , a 51 y.o. male  was evaluated in triage.  Pt complains of back pain.  Hx of MM, still receiving treatment.  Has also been getting injections.  Today pain worsening.  Denies numbness/weakness.  No incontinence.  Still ambulatory into ED.  Review of Systems  Positive: Back pain Negative: Numbness/weakness  Physical Exam  BP 116/70   Pulse 75   Temp 98.8 F (37.1 C) (Oral)   Resp 19   SpO2 98%  Gen:   Awake, no distress   Resp:  Normal effort  MSK:   Moves extremities without difficulty  Other:  Large back brace in place with neck support, able to move both legs on command  Medical Decision Making  Medically screening exam initiated at 11:23 PM.  Appropriate orders placed.  SAHAJ BONA was informed that the remainder of the evaluation will be completed by another provider, this initial triage assessment does not replace that evaluation, and the importance of remaining in the ED until their evaluation is complete.  Back pain.  Hx of MM, new compression fractures found recently with worsening pain.  No focal deficits in triage.  Discussed with attending, Dr. Laverta Baltimore-- given known disease and worsening pain, will obtain MRI T/L spine.     Larene Pickett, PA-C 03/06/22 2326

## 2022-03-07 ENCOUNTER — Emergency Department (HOSPITAL_COMMUNITY): Payer: BC Managed Care – PPO

## 2022-03-07 ENCOUNTER — Inpatient Hospital Stay (HOSPITAL_COMMUNITY)
Admission: EM | Admit: 2022-03-07 | Discharge: 2022-03-17 | DRG: 841 | Disposition: A | Discharge status: Skilled Nursing Facility | Payer: BC Managed Care – PPO | Attending: Internal Medicine | Admitting: Internal Medicine

## 2022-03-07 ENCOUNTER — Telehealth: Payer: Self-pay | Admitting: *Deleted

## 2022-03-07 DIAGNOSIS — D649 Anemia, unspecified: Secondary | ICD-10-CM | POA: Diagnosis present

## 2022-03-07 DIAGNOSIS — I10 Essential (primary) hypertension: Secondary | ICD-10-CM | POA: Diagnosis present

## 2022-03-07 DIAGNOSIS — M62838 Other muscle spasm: Secondary | ICD-10-CM | POA: Diagnosis present

## 2022-03-07 DIAGNOSIS — K5903 Drug induced constipation: Secondary | ICD-10-CM | POA: Diagnosis present

## 2022-03-07 DIAGNOSIS — M8458XA Pathological fracture in neoplastic disease, other specified site, initial encounter for fracture: Secondary | ICD-10-CM | POA: Diagnosis present

## 2022-03-07 DIAGNOSIS — Z515 Encounter for palliative care: Secondary | ICD-10-CM

## 2022-03-07 DIAGNOSIS — D630 Anemia in neoplastic disease: Secondary | ICD-10-CM | POA: Diagnosis present

## 2022-03-07 DIAGNOSIS — Z6836 Body mass index (BMI) 36.0-36.9, adult: Secondary | ICD-10-CM

## 2022-03-07 DIAGNOSIS — M545 Low back pain, unspecified: Principal | ICD-10-CM

## 2022-03-07 DIAGNOSIS — E669 Obesity, unspecified: Secondary | ICD-10-CM | POA: Diagnosis present

## 2022-03-07 DIAGNOSIS — Z79891 Long term (current) use of opiate analgesic: Secondary | ICD-10-CM

## 2022-03-07 DIAGNOSIS — K219 Gastro-esophageal reflux disease without esophagitis: Secondary | ICD-10-CM | POA: Diagnosis present

## 2022-03-07 DIAGNOSIS — M549 Dorsalgia, unspecified: Secondary | ICD-10-CM | POA: Diagnosis present

## 2022-03-07 DIAGNOSIS — Z8249 Family history of ischemic heart disease and other diseases of the circulatory system: Secondary | ICD-10-CM

## 2022-03-07 DIAGNOSIS — G8929 Other chronic pain: Secondary | ICD-10-CM | POA: Diagnosis present

## 2022-03-07 DIAGNOSIS — S42202A Unspecified fracture of upper end of left humerus, initial encounter for closed fracture: Secondary | ICD-10-CM | POA: Diagnosis present

## 2022-03-07 DIAGNOSIS — E782 Mixed hyperlipidemia: Secondary | ICD-10-CM | POA: Diagnosis present

## 2022-03-07 DIAGNOSIS — Z23 Encounter for immunization: Secondary | ICD-10-CM

## 2022-03-07 DIAGNOSIS — Z79899 Other long term (current) drug therapy: Secondary | ICD-10-CM

## 2022-03-07 DIAGNOSIS — C9000 Multiple myeloma not having achieved remission: Principal | ICD-10-CM | POA: Diagnosis present

## 2022-03-07 DIAGNOSIS — N179 Acute kidney failure, unspecified: Secondary | ICD-10-CM | POA: Diagnosis not present

## 2022-03-07 DIAGNOSIS — T402X5A Adverse effect of other opioids, initial encounter: Secondary | ICD-10-CM | POA: Diagnosis present

## 2022-03-07 DIAGNOSIS — M5459 Other low back pain: Secondary | ICD-10-CM | POA: Diagnosis present

## 2022-03-07 DIAGNOSIS — S32000A Wedge compression fracture of unspecified lumbar vertebra, initial encounter for closed fracture: Secondary | ICD-10-CM | POA: Diagnosis present

## 2022-03-07 DIAGNOSIS — F17210 Nicotine dependence, cigarettes, uncomplicated: Secondary | ICD-10-CM | POA: Diagnosis present

## 2022-03-07 DIAGNOSIS — Z7982 Long term (current) use of aspirin: Secondary | ICD-10-CM

## 2022-03-07 LAB — BASIC METABOLIC PANEL
Anion gap: 5 (ref 5–15)
BUN: 26 mg/dL — ABNORMAL HIGH (ref 6–20)
CO2: 22 mmol/L (ref 22–32)
Calcium: 9.2 mg/dL (ref 8.9–10.3)
Chloride: 108 mmol/L (ref 98–111)
Creatinine, Ser: 1.18 mg/dL (ref 0.61–1.24)
GFR, Estimated: >60 mL/min (ref >60)
Glucose, Bld: 87 mg/dL (ref 70–99)
Potassium: 3.9 mmol/L (ref 3.5–5.1)
Sodium: 135 mmol/L (ref 135–145)

## 2022-03-07 LAB — CBC
HCT: 28.8 % — ABNORMAL LOW (ref 39.0–52.0)
Hemoglobin: 9 g/dL — ABNORMAL LOW (ref 13.0–17.0)
MCH: 29 pg (ref 26.0–34.0)
MCHC: 31.3 g/dL (ref 30.0–36.0)
MCV: 92.9 fL (ref 80.0–100.0)
Platelets: 228 10*3/uL (ref 150–400)
RBC: 3.1 MIL/uL — ABNORMAL LOW (ref 4.22–5.81)
RDW: 13.5 % (ref 11.5–15.5)
WBC: 5.3 10*3/uL (ref 4.0–10.5)
nRBC: 0 % (ref 0.0–0.2)

## 2022-03-07 MED ORDER — HYDROCHLOROTHIAZIDE 25 MG PO TABS
25.0000 mg | ORAL_TABLET | Freq: Every day | ORAL | Status: DC
  Administered 2022-03-08: 25 mg via ORAL
  Filled 2022-03-07 (×2): qty 1

## 2022-03-07 MED ORDER — ACETAMINOPHEN 500 MG PO TABS
1000.0000 mg | ORAL_TABLET | Freq: Four times a day (QID) | ORAL | Status: DC | PRN
  Administered 2022-03-08 – 2022-03-09 (×2): 1000 mg via ORAL
  Filled 2022-03-07 (×2): qty 2

## 2022-03-07 MED ORDER — LORAZEPAM 2 MG/ML IJ SOLN
1.0000 mg | Freq: Once | INTRAMUSCULAR | Status: AC
  Administered 2022-03-07: 1 mg via INTRAVENOUS
  Filled 2022-03-07: qty 1

## 2022-03-07 MED ORDER — ONDANSETRON HCL 4 MG/2ML IJ SOLN
4.0000 mg | Freq: Four times a day (QID) | INTRAMUSCULAR | Status: DC | PRN
  Administered 2022-03-09 – 2022-03-15 (×4): 4 mg via INTRAVENOUS
  Filled 2022-03-07 (×5): qty 2

## 2022-03-07 MED ORDER — LISINOPRIL 20 MG PO TABS
40.0000 mg | ORAL_TABLET | Freq: Every day | ORAL | Status: DC
  Administered 2022-03-08: 40 mg via ORAL
  Filled 2022-03-07 (×3): qty 2

## 2022-03-07 MED ORDER — METHOCARBAMOL 1000 MG/10ML IJ SOLN: 1000.0000 mg | Freq: Once | INTRAMUSCULAR | Status: DC

## 2022-03-07 MED ORDER — MORPHINE SULFATE (PF) 2 MG/ML IV SOLN
2.0000 mg | INTRAVENOUS | Status: DC | PRN
  Administered 2022-03-08 – 2022-03-13 (×13): 2 mg via INTRAVENOUS
  Filled 2022-03-07 (×13): qty 1

## 2022-03-07 MED ORDER — PANTOPRAZOLE SODIUM 40 MG PO TBEC
40.0000 mg | DELAYED_RELEASE_TABLET | Freq: Every day | ORAL | Status: DC
  Administered 2022-03-08 – 2022-03-09 (×2): 40 mg via ORAL
  Filled 2022-03-07 (×2): qty 1

## 2022-03-07 MED ORDER — ROSUVASTATIN CALCIUM 10 MG PO TABS
10.0000 mg | ORAL_TABLET | Freq: Every day | ORAL | Status: DC
  Administered 2022-03-08 – 2022-03-09 (×2): 10 mg via ORAL
  Filled 2022-03-07 (×2): qty 1

## 2022-03-07 MED ORDER — LENALIDOMIDE 25 MG PO CAPS
25.0000 mg | ORAL_CAPSULE | Freq: Every day | ORAL | Status: DC
  Administered 2022-03-08 – 2022-03-09 (×2): 25 mg via ORAL

## 2022-03-07 MED ORDER — ALLOPURINOL 300 MG PO TABS
300.0000 mg | ORAL_TABLET | Freq: Every day | ORAL | Status: DC
  Administered 2022-03-08 – 2022-03-09 (×2): 300 mg via ORAL
  Filled 2022-03-07 (×2): qty 1

## 2022-03-07 MED ORDER — METOPROLOL TARTRATE 50 MG PO TABS
50.0000 mg | ORAL_TABLET | Freq: Two times a day (BID) | ORAL | Status: DC
  Administered 2022-03-08 – 2022-03-10 (×4): 50 mg via ORAL
  Filled 2022-03-07 (×5): qty 1

## 2022-03-07 MED ORDER — LORAZEPAM 2 MG/ML IJ SOLN
0.5000 mg | Freq: Once | INTRAMUSCULAR | Status: AC
  Administered 2022-03-07: 0.5 mg via INTRAVENOUS
  Filled 2022-03-07: qty 1

## 2022-03-07 MED ORDER — GADOBUTROL 1 MMOL/ML IV SOLN
10.0000 mL | Freq: Once | INTRAVENOUS | Status: AC | PRN
  Administered 2022-03-07: 10 mL via INTRAVENOUS

## 2022-03-07 MED ORDER — ENOXAPARIN SODIUM 40 MG/0.4ML IJ SOSY
40.0000 mg | PREFILLED_SYRINGE | INTRAMUSCULAR | Status: DC
  Administered 2022-03-08 – 2022-03-16 (×9): 40 mg via SUBCUTANEOUS
  Filled 2022-03-07 (×8): qty 0.4

## 2022-03-07 MED ORDER — ONDANSETRON HCL 4 MG PO TABS
4.0000 mg | ORAL_TABLET | Freq: Four times a day (QID) | ORAL | Status: DC | PRN
  Administered 2022-03-15 – 2022-03-17 (×4): 4 mg via ORAL
  Filled 2022-03-07 (×4): qty 1

## 2022-03-07 MED ORDER — METHOCARBAMOL 1000 MG/10ML IJ SOLN
500.0000 mg | Freq: Four times a day (QID) | INTRAVENOUS | Status: DC | PRN
  Administered 2022-03-08 – 2022-03-13 (×8): 500 mg via INTRAVENOUS
  Filled 2022-03-07: qty 500
  Filled 2022-03-07 (×2): qty 5
  Filled 2022-03-07 (×7): qty 500
  Filled 2022-03-07: qty 5

## 2022-03-07 MED ORDER — ACETAMINOPHEN 650 MG RE SUPP: 650.0000 mg | Freq: Four times a day (QID) | RECTAL | Status: DC | PRN

## 2022-03-07 MED ORDER — SENNOSIDES-DOCUSATE SODIUM 8.6-50 MG PO TABS
1.0000 | ORAL_TABLET | Freq: Two times a day (BID) | ORAL | Status: DC
  Administered 2022-03-08 – 2022-03-09 (×3): 1 via ORAL
  Filled 2022-03-07 (×3): qty 1

## 2022-03-07 MED ORDER — ASPIRIN 81 MG PO TBEC
81.0000 mg | DELAYED_RELEASE_TABLET | Freq: Every day | ORAL | Status: DC
  Administered 2022-03-08 – 2022-03-09 (×2): 81 mg via ORAL
  Filled 2022-03-07 (×2): qty 1

## 2022-03-07 MED ORDER — MORPHINE SULFATE (PF) 4 MG/ML IV SOLN
4.0000 mg | Freq: Once | INTRAVENOUS | Status: AC
  Administered 2022-03-07: 4 mg via INTRAVENOUS
  Filled 2022-03-07: qty 1

## 2022-03-07 MED ORDER — OXYCODONE HCL 5 MG PO TABS
15.0000 mg | ORAL_TABLET | Freq: Once | ORAL | Status: AC
  Administered 2022-03-07: 15 mg via ORAL
  Filled 2022-03-07: qty 3

## 2022-03-07 MED ORDER — OXYCODONE HCL 5 MG PO TABS
5.0000 mg | ORAL_TABLET | ORAL | Status: DC | PRN
  Administered 2022-03-08 (×2): 5 mg via ORAL
  Filled 2022-03-07 (×2): qty 1

## 2022-03-07 MED ORDER — METHOCARBAMOL 1000 MG/10ML IJ SOLN
1000.0000 mg | Freq: Once | INTRAVENOUS | Status: AC
  Administered 2022-03-07: 1000 mg via INTRAVENOUS
  Filled 2022-03-07: qty 10

## 2022-03-07 MED ORDER — POLYETHYLENE GLYCOL 3350 17 G PO PACK
17.0000 g | PACK | Freq: Every day | ORAL | Status: DC
  Administered 2022-03-08 – 2022-03-09 (×2): 17 g via ORAL
  Filled 2022-03-07 (×2): qty 1

## 2022-03-07 NOTE — ED Triage Notes (Signed)
Pt BIBA from home. Pt c/o lower back pain (mainly L), pt had back surgery 2 weeks ago, ran out of pain meds 1 week ago. Pt was at [facility] yesterday but refused MRI d/t claustrophobia. Pt current chemo recipient, last chemo 72 hrs ago.   Hx multiple myeloma.  Aox4  BP: 132/80 HR: 74 RR: 18 SpO2: 98 RA

## 2022-03-07 NOTE — Telephone Encounter (Signed)
Received call from pt c/o increase pain in his left side. He states it is excruciating.  He can barely sit or stand or walk. Advised that he needs to return to ED and complete evaluation that was started yesterday. He voiced understanding. His sister is on her way to try to take him to the ED. Advised that if he cannot get in her car then he would need to call EMS for transport.

## 2022-03-07 NOTE — ED Notes (Signed)
Registration states this patient left 

## 2022-03-07 NOTE — ED Provider Notes (Signed)
[place] DEPT Provider Note   CSN: [id number] Arrival date & time: 03/07/22  1306     History  Chief Complaint  Patient presents with   Back Pain    [name] is a 51 y.o. male.  HPI 51 year old male with history of multiple myeloma and back pain.  Patient with recent diagnosis of multiple myeloma.  He has several recent pathological fractures.  Patient had pathologic proximal left humeral fracture, lumbar L1-L2 and L3 fracture.  He has had several kyphoplasty's within the past 2 weeks.  He presents today with increasing pain over the past several days.  He denies any weakness, perineal numbness, loss of bowel or bladder control or change in sensation.  He was seen at [facility] last night.  They planned for an MRI but patient left because he was unable to have these without sedation.  He is in [place] Medications Prior to Admission medications   Medication Sig Start Date End Date Taking? Authorizing Provider  acetaminophen (TYLENOL) 500 MG tablet Take 1 tablet (500 mg total) by mouth every 8 (eight) hours as needed for mild pain or moderate pain. 02/24/22   Thurnell Lose, MD  acyclovir (ZOVIRAX) 400 MG tablet Take 1 tablet (400 mg total) by mouth 2 (two) times daily. 02/26/22   Lincoln Brigham, PA-C  allopurinol (ZYLOPRIM) 300 MG tablet Take 1 tablet (300 mg total) by mouth daily. 02/26/22   Lincoln Brigham, PA-C  aspirin EC 81 MG tablet Take 81 mg by mouth daily. Swallow whole.    [provider]  docusate sodium (COLACE) 100 MG capsule Take 1 capsule (100 mg total) by mouth 2 (two) times daily as needed for mild constipation. 02/24/22   Thurnell Lose, MD  hydrochlorothiazide (HYDRODIURIL) 25 MG tablet Take 1 tablet (25 mg total) by mouth daily. 12/05/21   [name], MD  ibuprofen (ADVIL) 600 MG tablet Take 1 tablet (600 mg total) by mouth every 6 (six) hours as needed. Patient taking differently: Take 600 mg by mouth  every 6 (six) hours as needed for mild pain or moderate pain. 01/04/22   Wilnette Kales, PA  lenalidomide (REVLIMID) 25 MG capsule Take 1 capsule (25 mg total) by mouth daily. Take for 14 days and then none for 7 days. Repeat every 21 days. [employer] Auth # [id number] Date Obtained 03/05/22 03/05/22   Orson Slick, MD  lisinopril (ZESTRIL) 40 MG tablet Take 1 tablet (40 mg total) by mouth daily. 12/05/21   [name], MD  metoprolol tartrate (LOPRESSOR) 50 MG tablet Take 1 tablet (50 mg total) by mouth 2 (two) times daily. 02/24/22   Thurnell Lose, MD  morphine (MS CONTIN) 30 MG 12 hr tablet Take 2 tablets (60 mg total) by mouth every 12 (twelve) hours. 02/28/22   Orson Slick, MD  ondansetron (ZOFRAN) 8 MG tablet Take 1 tablet (8 mg total) by mouth every 8 (eight) hours as needed for nausea or vomiting. 02/26/22   Lincoln Brigham, PA-C  oxyCODONE (ROXICODONE) 15 MG immediate release tablet Take 1 tablet (15 mg total) by mouth every 8 (eight) hours as needed for severe pain or breakthrough pain. 02/24/22   Thurnell Lose, MD  pantoprazole (PROTONIX) 40 MG tablet Take 1 tablet (40 mg total) by mouth daily. 12/05/21   [name], MD  polyethylene glycol powder [facility]) 17 GM/SCOOP powder Take 1 capful (17 g) with water  by mouth daily. 02/24/22   Thurnell Lose, MD  prochlorperazine (COMPAZINE) 10 MG tablet Take 1 tablet (10 mg total) by mouth every 6 (six) hours as needed for nausea or vomiting. 02/26/22   Lincoln Brigham, PA-C      Allergies    Patient has no known allergies.    Review of Systems   Review of Systems  Physical Exam Updated Vital Signs BP (!) 142/84   Pulse 79   Temp (!) 97.5 F (36.4 C) (Oral)   Resp 20   Ht 1.753 m ($Remove'5\' 9"'LUZenxV$ )   Wt 111.6 kg   SpO2 99%   BMI 36.33 kg/m  Physical Exam Vitals and nursing note reviewed.  Constitutional:      Appearance: Normal appearance. He is obese.  HENT:     Head: Normocephalic.     Right Ear: External ear  normal.     Left Ear: External ear normal.     Nose: Nose normal.     Mouth/Throat:     Pharynx: Oropharynx is clear.  Eyes:     Extraocular Movements: Extraocular movements intact.     Pupils: Pupils are equal, round, and reactive to light.  Cardiovascular:     Rate and Rhythm: Normal rate and regular rhythm.     Pulses: Normal pulses.     Heart sounds: Normal heart sounds.  Pulmonary:     Effort: Pulmonary effort is normal.     Breath sounds: Normal breath sounds.  Abdominal:     General: Abdomen is flat. Bowel sounds are normal.     Palpations: Abdomen is soft.  Musculoskeletal:     Cervical back: Normal range of motion.     Comments: Back examined with no obvious external signs of trauma there is some tenderness to palpation to the lateral lower thoracic spine and lumbar spine  Skin:    General: Skin is warm and dry.     Capillary Refill: Capillary refill takes less than 2 seconds.  Neurological:     General: No focal deficit present.     Mental Status: He is alert and oriented to person, place, and time.     Cranial Nerves: No cranial nerve deficit.     Sensory: No sensory deficit.     Motor: No weakness.     Coordination: Coordination normal.     Deep Tendon Reflexes: Reflexes normal.  Psychiatric:        Mood and Affect: Mood normal.        Behavior: Behavior normal.     ED Results / Procedures / Treatments   Labs (all labs ordered are listed, but only abnormal results are displayed) Labs Reviewed  CBC - Abnormal; Notable for the following components:      Result Value   RBC 3.10 (*)    Hemoglobin 9.0 (*)    HCT 28.8 (*)    All other components within normal limits  BASIC METABOLIC PANEL - Abnormal; Notable for the following components:   BUN 26 (*)    All other components within normal limits    EKG None  Radiology No results found.  Procedures Procedures    Medications Ordered in ED Medications  LORazepam (ATIVAN) injection 0.5 mg (0.5 mg  Intravenous Given 03/07/22 1539)  LORazepam (ATIVAN) injection 1 mg (1 mg Intravenous Given 03/07/22 1639)    ED Course/ Medical Decision Making/ A&P Clinical Course as of 03/07/22 1721  Fri Mar 07, 2022  1530 CBC reviewed interpreted and increasing  anemia [DR]  4643 Basic metabolic panel reviewed interpreted within normal limits [DR]    Clinical Course User Index [DR] Pattricia Boss, MD                           Medical Decision Making Patient with recent kyphoplasty ongoing back pain.  No new neurological deficits noted likely secondary to his recent pathological fractures.  However, given his injections, patient will need MRI.  Patient is given Ativan IV.  I discussed the nurse that we can give additional Ativan prior to going to MRI.  Patient is in agreement with this plan MRI pending Additional ativan given. Charge RN called mri and they state it will be another hour   Amount and/or Complexity of Data Reviewed Labs: ordered. Decision-making details documented in ED Course. Radiology: ordered. Discussion of management or test interpretation with external provider(s): Care discussed and signed out to Dr. Melina Copa   Risk Prescription drug management.           Final Clinical Impression(s) / ED Diagnoses Final diagnoses:  Acute left-sided low back pain without sciatica  Multiple myeloma not having achieved remission [facility])    Rx / DC Orders ED Discharge Orders     None         Pattricia Boss, MD 03/07/22 1721

## 2022-03-07 NOTE — Assessment & Plan Note (Addendum)
   L1, L2, L3 vertebral compression fractures s/p recent kyphoplasty  Slow improvement of substantial low back pain  Opiate and muscle relaxant regimen actively being titrated by palliative care, their input is appreciated  Continuing short course of dexamethasone as well per their recommendations  Continued attempts daily for patient to work with PT, OT  Plan is for patient to eventually go to skilled nursing facility once bed is available and prior authorization is complete

## 2022-03-07 NOTE — ED Provider Notes (Signed)
From Dr. Jeanell Sparrow.  51 year old male with history of multiple myeloma, pathologic fractures kyphoplasty.  Increased pain of his back over the last few days.  No neurologic symptoms.  He is pending an MRI of his thoracic and lumbar spine.  Disposition per results of testing. Physical Exam  BP (!) 142/84   Pulse 79   Temp (!) 97.5 F (36.4 C) (Oral)   Resp 20   Ht 5' 9" (1.753 m)   Wt 111.6 kg   SpO2 99%   BMI 36.33 kg/m   Physical Exam  Procedures  Procedures  ED Course / MDM   Clinical Course as of 03/07/22 1709  Fri Mar 07, 2022  1530 CBC reviewed interpreted and increasing anemia [DR]  6789 Basic metabolic panel reviewed interpreted within normal limits [DR]    Clinical Course User Index [DR] Pattricia Boss, MD   Medical Decision Making Amount and/or Complexity of Data Reviewed Labs: ordered. Radiology: ordered.  Risk Prescription drug management. Decision regarding hospitalization.   Patient's MRI does not show any acute findings that would require emergent intervention.  When I went to check on him he was in significant amount of pain.  He said he has been out of his pain medicine for 4 to 5 days.  He is having significant spasms.  He was remedicated with pain medication.  Still unable to transition to sitting much less get him back in his brace and ambulate him.  Discussed with Dr. Posey Pronto [facility] hospitalist who will evaluate for admission for further pain control.       Hayden Rasmussen, MD 03/08/22 1102

## 2022-03-07 NOTE — Assessment & Plan Note (Addendum)
   In setting of multiple myeloma.    Stable, no clinical evidence of bleeding   Monitoring hemoglobin and hematocrit with periodic CBCs.

## 2022-03-07 NOTE — Assessment & Plan Note (Addendum)
   Mostly normotensive without scheduled antihypertensives  As needed intravenous antihypertensives for markedly elevated blood pressure.

## 2022-03-07 NOTE — Hospital Course (Addendum)
[name] is a 51 y.o. male with medical history significant for IgG kappa multiple myeloma on active treatment with Velcade/Revlimid, pathologic left humeral fracture (s/p IM nail 02/22/2022) and L1, L2, L3 compression fractures s/p radiofrequency ablation and kyphoplasty with L1-L2 on 02/19/2022 and L3 on 02/05/2022, HTN, anemia of chronic disease who is admitted with intractable lower back pain.  Patient remained in the hospital service for intractable pain with titration of opiate-based analgesics and muscle relaxants with the assistance of the palliative care service.  PT evaluation revealed the patient would likely benefit from repeat a skilled nursing facility.

## 2022-03-07 NOTE — H&P (Addendum)
History and Physical    [name] QPY:[id number] DOB: 1971-04-17 DOA: 03/07/2022  PCP: [name], MD  Patient coming from: Home  I have personally briefly reviewed patient's old medical records in [place]  Chief Complaint: Back pain  HPI: [name] is a 51 y.o. male with medical history significant for IgG kappa multiple myeloma on active treatment with Velcade/Revlimid, pathologic left humeral fracture (s/p IM nail 02/22/2022) and L1, L2, L3 compression fractures s/p radiofrequency ablation and kyphoplasty (L1, L2 on 02/19/2022 and L3 on 02/05/2022), HTN, anemia of chronic disease who presented to the ED for evaluation of worsening lower back pain.  Patient was recently hospitalized 02/17/2022-02/24/2022 for pathologic proximal left humeral fracture and lumbar L1-2 compression fractures.  He underwent IR kyphoplasty on 9/27 in the left humeral shaft medullary nailing 9/30.  He was discharged to home with TLSO brace, pain control, PT/OT.  Patient states he was doing fairly well and working with PT until he began to have current and worsening lower back pain yesterday.  He thinks he may have overexerted himself with therapy.  He is having frequent muscle spasm type sensations in his lower back which causes him to experience severe pain.  He has been ambulating with the use of a walker since his hospitalization but has been having difficulty doing so today.  He has not had any new falls.  He denies any subjective fevers, chills, diaphoresis, chest pain, dyspnea.  He was having some constipation but is now having regular bowel movements since starting MiraLAX.  He ran out of his hospital discharge pain medications.  His oncologist did send a prescription for MS Contin however patient has not been able to obtain these from the pharmacy due to awaiting insurance approval.  ED Course  Labs/Imaging on admission: I have personally reviewed following labs and imaging  studies.  Initial vitals showed BP 138/90, pulse 72, RR 18, temp 97.5 F, SPO2 98% on room air.  Labs show WBC 5.3, hemoglobin 9.0, platelets 228,000, sodium 135, potassium 3.9, bicarb 22, BUN 26, creatinine 1.18.  MRI thoracic and lumbar spine with and without contrast negative for epidural abscess or other acute abnormality of the thoracic or lumbar spine.  Diffuse bone marrow heterogeneity consistent with multiple myeloma noted.  Small focus of hyperintense T2 weighted signal in the spinal cord at T2-3, T5-6, and T6-7 is nonspecific and likely indicates myelomalacia.  Chronic compression fractures at L1, L2, L3 seen now status post augmentation.  Unchanged moderate left L4-5 neuroforaminal stenosis also seen.  Patient was given IV Ativan for MRI.  He received IV morphine 4 mg and oxycodone 15 mg without significant relief in pain.  He is ordered to receive IM Robaxin 1000 mg.  The hospitalist service was consulted to admit for further evaluation and management.  Review of Systems: All systems reviewed and are negative except as documented in history of present illness above.   Past Medical History:  Diagnosis Date   Allergy    Anemia    Atypical chest pain    Cancer (HCC)    Multiple myeloma with normocytic anemia   GERD (gastroesophageal reflux disease)    Hepatic cyst    Benign by MRI   Hiatal hernia    History of colonic polyps    Hyperlipidemia    Hypertension    Rectal bleeding    Tobacco abuse     Past Surgical History:  Procedure Laterality Date   COLONOSCOPY  01/10/2009  RMR: Normal rectum, normal colon; repeat in 2015 due to [place] of colon cancer   COLONOSCOPY N/A 01/09/2014   [id number] colonic polyps-removed as described   COLONOSCOPY N/A 09/18/2016   Procedure: COLONOSCOPY;  Surgeon: Daneil Dolin, MD;  [facility]: [facility];  Service: Endoscopy;  Laterality: N/A;  730    ESOPHAGOGASTRODUODENOSCOPY  10/04/2007   RMR: Distal esophageal erosions consistent  with erosive reflux esophagitis, patulous gastroesophageal junction status post passage of a  [place] dilator, 91 [place].  Otherwise, unremarkable esophagus.  Hiatal hernia.  Otherwise normal stomach.  Bulbar erosion   ESOPHAGOGASTRODUODENOSCOPY N/A 01/09/2014   Erosive reflux esophagitis. Small hiatal hernia   HUMERUS IM NAIL Left 02/22/2022   Procedure: INTRAMEDULLARY (IM) NAIL HUMERAL;  Surgeon: Nicholes Stairs, MD;  Location: [place];  Service: Orthopedics;  Laterality: Left;   I & D EXTREMITY Left 07/26/2018   Procedure: IRRIGATION AND DEBRIDEMENT EXTREMITY;  Surgeon: Dayna Barker, MD;  Location: [facility];  Service: Plastics;  Laterality: Left;   IR BONE TUMOR(S)RF ABLATION  02/05/2022   IR BONE TUMOR(S)RF ABLATION  02/24/2022   IR KYPHO EA ADDL LEVEL THORACIC OR LUMBAR  02/19/2022   IR KYPHO LUMBAR INC FX REDUCE BONE BX UNI/BIL CANNULATION INC/IMAGING  02/05/2022   IR KYPHO LUMBAR INC FX REDUCE BONE BX UNI/BIL CANNULATION INC/IMAGING  02/19/2022   PERCUTANEOUS PINNING Left 07/26/2018   Procedure: PERCUTANEOUS PINNING EXTREMITY;  Surgeon: Dayna Barker, MD;  Location: [facility];  Service: Plastics;  Laterality: Left;    Social History:  reports that he has been smoking cigarettes. He has a 2.50 pack-year smoking history. He has never used smokeless tobacco. He reports current alcohol use. He reports that he does not use drugs.  No Known Allergies  Family History  Problem Relation Age of Onset   Heart disease Mother    Hypertension Mother    Diabetes Father    Hypertension Father    Colon cancer Sister        passed away from colon cancer, in her 20s   Heart attack Maternal Uncle    Prostate cancer Maternal Uncle    Diabetes Other    Pancreatic cancer Neg Hx    Rectal cancer Neg Hx    Esophageal cancer Neg Hx    Stomach cancer Neg Hx      Prior to Admission medications   Medication Sig Start Date End Date Taking? Authorizing Provider  acetaminophen (TYLENOL) 500 MG tablet Take 1  tablet (500 mg total) by mouth every 8 (eight) hours as needed for mild pain or moderate pain. 02/24/22   Thurnell Lose, MD  acyclovir (ZOVIRAX) 400 MG tablet Take 1 tablet (400 mg total) by mouth 2 (two) times daily. 02/26/22   Lincoln Brigham, PA-C  allopurinol (ZYLOPRIM) 300 MG tablet Take 1 tablet (300 mg total) by mouth daily. 02/26/22   Lincoln Brigham, PA-C  aspirin EC 81 MG tablet Take 81 mg by mouth daily. Swallow whole.    [provider]  docusate sodium (COLACE) 100 MG capsule Take 1 capsule (100 mg total) by mouth 2 (two) times daily as needed for mild constipation. 02/24/22   Thurnell Lose, MD  hydrochlorothiazide (HYDRODIURIL) 25 MG tablet Take 1 tablet (25 mg total) by mouth daily. 12/05/21   [name], MD  ibuprofen (ADVIL) 600 MG tablet Take 1 tablet (600 mg total) by mouth every 6 (six) hours as needed. Patient taking differently: Take 600 mg by mouth every 6 (six)  hours as needed for mild pain or moderate pain. 01/04/22   Wilnette Kales, PA  lenalidomide (REVLIMID) 25 MG capsule Take 1 capsule (25 mg total) by mouth daily. Take for 14 days and then none for 7 days. Repeat every 21 days. [employer] Auth # [id number] Date Obtained 03/05/22 03/05/22   Orson Slick, MD  lisinopril (ZESTRIL) 40 MG tablet Take 1 tablet (40 mg total) by mouth daily. 12/05/21   [name], MD  metoprolol tartrate (LOPRESSOR) 50 MG tablet Take 1 tablet (50 mg total) by mouth 2 (two) times daily. 02/24/22   Thurnell Lose, MD  morphine (MS CONTIN) 30 MG 12 hr tablet Take 2 tablets (60 mg total) by mouth every 12 (twelve) hours. 02/28/22   Orson Slick, MD  ondansetron (ZOFRAN) 8 MG tablet Take 1 tablet (8 mg total) by mouth every 8 (eight) hours as needed for nausea or vomiting. 02/26/22   Lincoln Brigham, PA-C  oxyCODONE (ROXICODONE) 15 MG immediate release tablet Take 1 tablet (15 mg total) by mouth every 8 (eight) hours as needed for severe pain or breakthrough pain. 02/24/22    Thurnell Lose, MD  pantoprazole (PROTONIX) 40 MG tablet Take 1 tablet (40 mg total) by mouth daily. 12/05/21   [name], MD  polyethylene glycol powder [facility]) 17 GM/SCOOP powder Take 1 capful (17 g) with water by mouth daily. 02/24/22   Thurnell Lose, MD  prochlorperazine (COMPAZINE) 10 MG tablet Take 1 tablet (10 mg total) by mouth every 6 (six) hours as needed for nausea or vomiting. 02/26/22   Lincoln Brigham, PA-C    Physical Exam: Vitals:   03/07/22 2200 03/07/22 2300 03/07/22 2327 03/07/22 2330  BP: 116/60 (!) 143/90  109/68  Pulse: 86 98  92  Resp: _0 Temp:   98.6 F (37 C)   TempSrc:      SpO2: 91% 98%  93%  Weight:      Height:       Constitutional: Resting supine in bed.  Intermittent severe lower back pain spells causing him to wince in pain and retract legs.  Otherwise calm and answering questions appropriately. Eyes: EOMI, lids and conjunctivae normal ENMT: Mucous membranes are moist. Posterior pharynx clear of any exudate or lesions.Normal dentition.  Neck: normal, supple, no masses. Respiratory: clear to auscultation anteriorly.  Normal respiratory effort. No accessory muscle use.  Cardiovascular: Regular rate and rhythm, no murmurs / rubs / gallops. Abdomen: no tenderness, no masses palpated. Musculoskeletal: Postsurgical wound dressing in place left shoulder, good ROM.   Skin: no rashes, lesions, ulcers. No induration Neurologic: Sensation intact. Strength equal bilaterally. Psychiatric: Alert and oriented x 3. Normal mood.   EKG: Not performed.  Assessment/Plan Principal Problem:   Intractable low back pain Active Problems:   Traumatic compression fracture of lumbar vertebra (HCC)   Multiple myeloma not having achieved remission ([place])   Essential hypertension   Normocytic anemia   KEAGHAN STATON is a 51 y.o. male with medical history significant for IgG kappa multiple myeloma on active treatment with Velcade/Revlimid,  pathologic left humeral fracture (s/p IM nail 02/22/2022) and L1, L2, L3 compression fractures s/p radiofrequency ablation and kyphoplasty (L1, L2 on 02/19/2022 and L3 on 02/05/2022), HTN, anemia of chronic disease who is admitted with intractable lower back pain.  Assessment and Plan: * Intractable low back pain L1, L2, L3 vertebral compression fractures s/p recent kyphoplasty Presenting with recurrent intractable lower back  pain in setting of known compression fractures as well as muscle spasms.  MRI thoracic and lumbar spine negative for evidence of epidural abscess or other acute abnormality.  Chronic changes otherwise noted.  No loss of bowel/bladder control. -Continue analgesics with Tylenol, Oxy IR, IV morphine as needed -IV Robaxin as needed for muscle spasms -Continue PT/OT -Continue bowel regimen while on narcotic pain meds  Multiple myeloma not having achieved remission [name]) Follows with oncology, Dr. Lorenso Courier.  On active therapy with Velcade and Revlimid.  Normocytic anemia In setting of multiple myeloma.  Hemoglobin slightly decreased from baseline at 9.0.  No obvious bleeding.  Continue to monitor.  Essential hypertension Continue lisinopril, Lopressor, HCTZ.  DVT prophylaxis: enoxaparin (LOVENOX) injection 40 mg Start: 03/08/22 2200 Code Status: Full code Family Communication: Sister and brother-in-law at bedside Disposition Plan: From home and likely discharge to home pending clinical progress Consults called: None Severity of Illness: The appropriate patient status for this patient is OBSERVATION. Observation status is judged to be reasonable and necessary in order to provide the required intensity of service to ensure the patient's safety. The patient's presenting symptoms, physical exam findings, and initial radiographic and laboratory data in the context of their medical condition is felt to place them at decreased risk for further clinical deterioration. Furthermore, it is  anticipated that the patient will be medically stable for discharge from the hospital within 2 midnights of admission.   Zada Finders MD [facility]  If 7PM-7AM, please contact night-coverage [email or web address]  03/07/2022, 11:59 PM

## 2022-03-07 NOTE — Assessment & Plan Note (Addendum)
   Case discussed with Dr. Leonides Schanz with oncology who states that it is okay for patient to continue Revlimid while hospitalized   Dr. Leonides Schanz recommends outpatient follow-up with him for evaluation as to when to resume Velcade.

## 2022-03-08 ENCOUNTER — Encounter (HOSPITAL_COMMUNITY): Payer: Self-pay | Admitting: Internal Medicine

## 2022-03-08 ENCOUNTER — Encounter: Payer: Self-pay | Admitting: Physician Assistant

## 2022-03-08 DIAGNOSIS — C9000 Multiple myeloma not having achieved remission: Secondary | ICD-10-CM | POA: Diagnosis not present

## 2022-03-08 DIAGNOSIS — M62838 Other muscle spasm: Secondary | ICD-10-CM | POA: Diagnosis present

## 2022-03-08 DIAGNOSIS — Z515 Encounter for palliative care: Secondary | ICD-10-CM | POA: Diagnosis not present

## 2022-03-08 DIAGNOSIS — I10 Essential (primary) hypertension: Secondary | ICD-10-CM

## 2022-03-08 DIAGNOSIS — E782 Mixed hyperlipidemia: Secondary | ICD-10-CM | POA: Diagnosis present

## 2022-03-08 DIAGNOSIS — Z79899 Other long term (current) drug therapy: Secondary | ICD-10-CM | POA: Diagnosis not present

## 2022-03-08 DIAGNOSIS — K5903 Drug induced constipation: Secondary | ICD-10-CM | POA: Diagnosis not present

## 2022-03-08 DIAGNOSIS — E669 Obesity, unspecified: Secondary | ICD-10-CM | POA: Diagnosis present

## 2022-03-08 DIAGNOSIS — Z23 Encounter for immunization: Secondary | ICD-10-CM | POA: Diagnosis not present

## 2022-03-08 DIAGNOSIS — Z6836 Body mass index (BMI) 36.0-36.9, adult: Secondary | ICD-10-CM | POA: Diagnosis not present

## 2022-03-08 DIAGNOSIS — S42202D Unspecified fracture of upper end of left humerus, subsequent encounter for fracture with routine healing: Secondary | ICD-10-CM | POA: Diagnosis not present

## 2022-03-08 DIAGNOSIS — Z79891 Long term (current) use of opiate analgesic: Secondary | ICD-10-CM | POA: Diagnosis not present

## 2022-03-08 DIAGNOSIS — K219 Gastro-esophageal reflux disease without esophagitis: Secondary | ICD-10-CM | POA: Diagnosis not present

## 2022-03-08 DIAGNOSIS — G8929 Other chronic pain: Secondary | ICD-10-CM | POA: Diagnosis present

## 2022-03-08 DIAGNOSIS — Z7982 Long term (current) use of aspirin: Secondary | ICD-10-CM | POA: Diagnosis not present

## 2022-03-08 DIAGNOSIS — M8458XA Pathological fracture in neoplastic disease, other specified site, initial encounter for fracture: Secondary | ICD-10-CM | POA: Diagnosis present

## 2022-03-08 DIAGNOSIS — M5459 Other low back pain: Secondary | ICD-10-CM | POA: Diagnosis not present

## 2022-03-08 DIAGNOSIS — M545 Low back pain, unspecified: Secondary | ICD-10-CM

## 2022-03-08 DIAGNOSIS — T402X5A Adverse effect of other opioids, initial encounter: Secondary | ICD-10-CM | POA: Diagnosis present

## 2022-03-08 DIAGNOSIS — F17210 Nicotine dependence, cigarettes, uncomplicated: Secondary | ICD-10-CM | POA: Diagnosis present

## 2022-03-08 DIAGNOSIS — M549 Dorsalgia, unspecified: Secondary | ICD-10-CM | POA: Diagnosis present

## 2022-03-08 DIAGNOSIS — D630 Anemia in neoplastic disease: Secondary | ICD-10-CM | POA: Diagnosis present

## 2022-03-08 DIAGNOSIS — N179 Acute kidney failure, unspecified: Secondary | ICD-10-CM | POA: Diagnosis not present

## 2022-03-08 DIAGNOSIS — Z8249 Family history of ischemic heart disease and other diseases of the circulatory system: Secondary | ICD-10-CM | POA: Diagnosis not present

## 2022-03-08 LAB — CBC
HCT: 29.4 % — ABNORMAL LOW (ref 39.0–52.0)
Hemoglobin: 9.2 g/dL — ABNORMAL LOW (ref 13.0–17.0)
MCH: 28.9 pg (ref 26.0–34.0)
MCHC: 31.3 g/dL (ref 30.0–36.0)
MCV: 92.5 fL (ref 80.0–100.0)
Platelets: 207 10*3/uL (ref 150–400)
RBC: 3.18 MIL/uL — ABNORMAL LOW (ref 4.22–5.81)
RDW: 13.5 % (ref 11.5–15.5)
WBC: 5.2 10*3/uL (ref 4.0–10.5)
nRBC: 0 % (ref 0.0–0.2)

## 2022-03-08 LAB — BASIC METABOLIC PANEL
Anion gap: 6 (ref 5–15)
BUN: 17 mg/dL (ref 6–20)
CO2: 24 mmol/L (ref 22–32)
Calcium: 9.4 mg/dL (ref 8.9–10.3)
Chloride: 104 mmol/L (ref 98–111)
Creatinine, Ser: 1.01 mg/dL (ref 0.61–1.24)
GFR, Estimated: >60 mL/min (ref >60)
Glucose, Bld: 89 mg/dL (ref 70–99)
Potassium: 4 mmol/L (ref 3.5–5.1)
Sodium: 134 mmol/L — ABNORMAL LOW (ref 135–145)

## 2022-03-08 MED ORDER — OXYCODONE HCL 5 MG PO TABS
15.0000 mg | ORAL_TABLET | ORAL | Status: DC | PRN
  Administered 2022-03-08 – 2022-03-09 (×4): 15 mg via ORAL
  Filled 2022-03-08 (×4): qty 3

## 2022-03-08 MED ORDER — TIZANIDINE HCL 4 MG PO TABS
4.0000 mg | ORAL_TABLET | Freq: Three times a day (TID) | ORAL | Status: DC
  Administered 2022-03-08 – 2022-03-09 (×2): 4 mg via ORAL
  Filled 2022-03-08 (×2): qty 1

## 2022-03-08 MED ORDER — INFLUENZA VAC SPLIT QUAD 0.5 ML IM SUSY
0.5000 mL | PREFILLED_SYRINGE | INTRAMUSCULAR | Status: AC
  Administered 2022-03-13: 0.5 mL via INTRAMUSCULAR

## 2022-03-08 MED ORDER — MORPHINE SULFATE ER 30 MG PO TBCR
60.0000 mg | EXTENDED_RELEASE_TABLET | Freq: Three times a day (TID) | ORAL | Status: DC
  Administered 2022-03-08 – 2022-03-17 (×27): 60 mg via ORAL
  Filled 2022-03-08 (×27): qty 2

## 2022-03-08 NOTE — Plan of Care (Signed)
  Problem: Nutrition: Goal: Adequate nutrition will be maintained Outcome: Progressing   

## 2022-03-08 NOTE — TOC Initial Note (Signed)
Transition of Care [facility]) - Initial/Assessment Note    Patient Details  Name: [name] MRN: [id number] Date of Birth: [birth year]  Transition of [facility]) CM/SW Contact:    Lennart Pall, LCSW Phone Number: 03/08/2022, 2:40 PM  Clinical Narrative:                 Met with pt and sister today to discuss dc planning needs.  Pt admits much frustration with pain control issues and concern of what may have caused this but aware no findings from imaging.  He is concerned about his inability to move without terrible pain and feels he is going to be bed bound unless there is improvement.  He worries that he lives alone and may need 24/7 support.   Broad discussion with pt and sister about dc planning options including SNF rehab or private arrangement of support at home.  At this point, he will need to be able to participate with therapies to better determine overall functioning.  TOC will follow along and assist with dc planning.  Expected Discharge Plan: [place] Barriers to Discharge: Continued Medical Work up   Patient Goals and CMS Choice Patient states their goals for this hospitalization and ongoing recovery are:: control of pain      Expected Discharge Plan and Services Expected Discharge Plan: [place] In-house Referral: Clinical Social Work     Living arrangements for the past 2 months: Single Family Home                                      Prior Living Arrangements/Services Living arrangements for the past 2 months: Single Family Home Lives with:: Self Patient language and need for interpreter reviewed:: Yes Do you feel safe going back to the place where you live?: Yes      Need for Family Participation in Patient Care: Yes (Comment) Care giver support system in place?: Yes (comment)   Criminal Activity/Legal Involvement Pertinent to Current Situation/Hospitalization: No - Comment as needed  Activities of Daily  Living Home Assistive Devices/Equipment: Walker (specify type) (Rolling) ADL Screening (condition at time of admission) Patient's cognitive ability adequate to safely complete daily activities?: Yes Is the patient deaf or have difficulty hearing?: No Does the patient have difficulty seeing, even when wearing glasses/contacts?: No Does the patient have difficulty concentrating, remembering, or making decisions?: No Patient able to express need for assistance with ADLs?: Yes Does the patient have difficulty dressing or bathing?: No Independently performs ADLs?: Yes (appropriate for developmental age) Does the patient have difficulty walking or climbing stairs?: No Weakness of Legs: None Weakness of Arms/Hands: Left  Permission Sought/Granted Permission sought to share information with : Family Supports Permission granted to share information with : Yes, Verbal Permission Granted  Share Information with NAME: [name]     Permission granted to share info w Relationship: sister  Permission granted to share info w Contact Information: [phone]  Emotional Assessment Appearance:: Appears stated age Attitude/Demeanor/Rapport: Engaged Affect (typically observed): Frustrated Orientation: : Oriented to Self, Oriented to Place, Oriented to  Time, Oriented to Situation Alcohol / Substance Use: Not Applicable Psych Involvement: No (comment)  Admission diagnosis:  Multiple myeloma not having achieved remission ([place]) [C90.00] Intractable low back pain [M54.59] Acute left-sided low back pain without sciatica [M54.50] Patient Active Problem List   Diagnosis Date Noted   Intractable low  back pain 03/07/2022   Pathologic fracture 02/17/2022   Traumatic compression fracture of lumbar vertebra ([place]) 02/17/2022   Normocytic anemia 02/17/2022   Hyponatremia 02/17/2022   Fall at home, initial encounter 02/17/2022   Obesity (BMI 30-39.9) 02/17/2022   Multiple myeloma ([place]) 02/16/2022    Multiple myeloma not having achieved remission ([place]) 02/16/2022   Hyperlipidemia 02/17/2019   Lesion of liver 09/23/2017   Atypical chest pain 05/01/2016   Abnormal ultrasound 07/19/2015   ALCOHOL ABUSE 09/06/2008   TOBACCO ABUSE 09/06/2008   Essential hypertension 09/06/2008   Chronic GERD 09/06/2008   PCP:  [name], MD Pharmacy:   [facility] ([place]), [place] - [address] [place] [address] Phone: [phone] Fax: [phone]  [id number] - [place], [place] [address] [place] [address] Phone: [phone] Fax: [phone]  [facility], [place] - [address] [place] [address] Phone: [phone] Fax: [place] [address] [place] [address] Phone: [phone] Fax: [phone]     Social Determinants of Health (SDOH) Interventions    Readmission Risk Interventions    02/19/2022    3:30 PM  Readmission Risk Prevention Plan  Post Dischage Appt Complete  Medication Screening Complete  Transportation Screening Complete

## 2022-03-08 NOTE — Plan of Care (Signed)
?  Problem: Clinical Measurements: ?Goal: Will remain free from infection ?Outcome: Progressing ?  ?

## 2022-03-08 NOTE — Progress Notes (Signed)
Pts Revlimid home medication sent to pharmacy for verification.

## 2022-03-08 NOTE — Progress Notes (Signed)
PROGRESS NOTE    [name]  AOZ:[id number] DOB: [birth year] DOA: 03/07/2022 PCP: [name], MD    Brief Narrative:  51 year old male with history of multiple myeloma, recent discharge from the hospital after he was treated for pathological proximal left humeral fracture, multiple lumbar compression fractures status post kyphoplasty.  Reports that he was discharged home and was initially doing well.  On the day before admission, he reports worsening lower back pain.  Denies any fall or obvious trauma.  MRI of the T/L-spine did not show any new acute findings.  He was admitted for symptom management with pain control.   Assessment & Plan:   Principal Problem:   Intractable low back pain Active Problems:   Traumatic compression fracture of lumbar vertebra ([facility])   Multiple myeloma not having achieved remission ([facility])   Essential hypertension   Normocytic anemia   Back pain   Intractable back pain Recent lumbar compression fracture status post kyphoplasty -Presented with recurrent intractable lower back pain -Denies any fall or other identifiable trauma -Pain does not radiate down his legs -MRI of the lumbar and thoracic spine did not show any new fractures or acute findings -He is on chronic pain medications -Reports some improvement with Robaxin, although limited -We will place on scheduled Zanaflex 3 times daily -Continue pain management -Continue physical therapy  Chronic pain -Chronically on MS Contin and oxycodone -We will continue home doses  Multiple myeloma -Follows with oncology, Dr. Lorenso Courier -On therapy with Velcade and Revlimid  Normocytic anemia -Suspect chronic disease related to underlying malignancy -No signs of bleeding -Hemoglobin is currently at baseline  Hypertension -Continue home regimen of lisinopril, Lopressor and hydrochlorothiazide   DVT prophylaxis: enoxaparin (LOVENOX) injection 40 mg Start: 03/08/22 2200  Code Status: full  code Family Communication: Several family present at bedside Disposition Plan: Status is: Inpatient Remains inpatient appropriate because: Requiring IV pain medicines for intractable back pain     Consultants:    Procedures:    Antimicrobials:      Subjective: Patient complains of continued lower back pain that is worse with any movement. Describes the pain as radiating across his lower back and also up his back. He denies any radiation of pain down his legs  Objective: Vitals:   03/08/22 0326 03/08/22 0943 03/08/22 1245 03/08/22 1448  BP: 119/77 120/81 126/81 127/70  Pulse: 83 81 91 99  Resp: _0 Temp: 98 F (36.7 C) 98.6 F (37 C) 97.8 F (36.6 C) 98.4 F (36.9 C)  TempSrc: Oral Oral Oral   SpO2: 98% 99% 99% 98%  Weight:      Height:        Intake/Output Summary (Last 24 hours) at 03/08/2022 1732 Last data filed at 03/08/2022 1727 Gross per 24 hour  Intake 175 ml  Output 2000 ml  Net -1825 ml   Filed Weights   03/07/22 1322  Weight: 111.6 kg    Examination:  General exam: Appears calm and comfortable  Respiratory system: Clear to auscultation. Respiratory effort normal. Cardiovascular system: S1 & S2 heard, RRR. No JVD, murmurs, rubs, gallops or clicks. No pedal edema. Gastrointestinal system: Abdomen is nondistended, soft and nontender. No organomegaly or masses felt. Normal bowel sounds heard. Central nervous system: Alert and oriented. No focal neurological deficits. MSK: tenderness over spine as well as flanks bilaterally, seems to be superficial tenderness to palpation Skin: No rashes, lesions or ulcers Psychiatry: Judgement and insight appear normal. Mood & affect appropriate.  Data Reviewed: I have personally reviewed following labs and imaging studies  CBC: Recent Labs  Lab 03/05/22 1315 03/07/22 1415 03/08/22 0318  WBC 5.1 5.3 5.2  NEUTROABS 2.9  --   --   HGB 9.4* 9.0* 9.2*  HCT 28.6* 28.8* 29.4*  MCV 90.5 92.9 92.5   PLT 235 228 003   Basic Metabolic Panel: Recent Labs  Lab 03/05/22 1315 03/07/22 1415 03/08/22 0318  NA 133* 135 134*  K 4.3 3.9 4.0  CL 103 108 104  CO2 _0 GLUCOSE 100* 87 89  BUN 20 26* 17  CREATININE 1.17 1.18 1.01  CALCIUM 9.4 9.2 9.4   GFR: Estimated Creatinine Clearance: 105.4 mL/min (by C-G formula based on SCr of 1.01 mg/dL). Liver Function Tests: Recent Labs  Lab 03/05/22 1315  AST 18  ALT 26  ALKPHOS 64  BILITOT 0.3  PROT 10.3*  ALBUMIN 3.2*   No results for input(s): "LIPASE", "AMYLASE" in the last 168 hours. No results for input(s): "AMMONIA" in the last 168 hours. Coagulation Profile: No results for input(s): "INR", "PROTIME" in the last 168 hours. Cardiac Enzymes: No results for input(s): "CKTOTAL", "CKMB", "CKMBINDEX", "TROPONINI" in the last 168 hours. BNP (last 3 results) No results for input(s): "PROBNP" in the last 8760 hours. HbA1C: No results for input(s): "HGBA1C" in the last 72 hours. CBG: No results for input(s): "GLUCAP" in the last 168 hours. Lipid Profile: No results for input(s): "CHOL", "HDL", "LDLCALC", "TRIG", "CHOLHDL", "LDLDIRECT" in the last 72 hours. Thyroid Function Tests: No results for input(s): "TSH", "T4TOTAL", "FREET4", "T3FREE", "THYROIDAB" in the last 72 hours. Anemia Panel: No results for input(s): "VITAMINB12", "FOLATE", "FERRITIN", "TIBC", "IRON", "RETICCTPCT" in the last 72 hours. Sepsis Labs: No results for input(s): "PROCALCITON", "LATICACIDVEN" in the last 168 hours.  No results found for this or any previous visit (from the past 240 hour(s)).       Radiology Studies: MR Lumbar Spine W Wo Contrast  Result Date: 03/07/2022 CLINICAL DATA:  Mid back pain with suspected epidural abscess. EXAM: MRI THORACIC AND LUMBAR SPINE WITHOUT AND WITH CONTRAST TECHNIQUE: Multiplanar and multiecho pulse sequences of the thoracic and lumbar spine were obtained without and with intravenous contrast. CONTRAST:  65m  GADAVIST GADOBUTROL 1 MMOL/ML IV SOLN COMPARISON:  Lumbar spine MRI 01/04/2022 FINDINGS: MRI THORACIC SPINE FINDINGS Alignment:  Physiologic. Vertebrae: Diffuse bone marrow heterogeneity.  No focal lesion. Cord: Small focus of hyperintense T2-weighted signal in the spinal cord at T2-3, T5-6 and T6-7. Paraspinal and other soft tissues: Negative. Disc levels: Multilevel degenerative disc disease without spinal canal stenosis or neural impingement. MRI LUMBAR SPINE FINDINGS Segmentation:  Standard. Alignment:  Physiologic. Vertebrae: Diffuse bone marrow signal heterogeneity. Since 01/04/2022, there been compression fractures at L1, L2 and L3, now status post augmentation no acute lesion. Conus medullaris: Extends to the L1 level and appears normal. Paraspinal and other soft tissues: Distended urinary bladder Disc levels: L1-L2: Normal disc space and facet joints. No spinal canal stenosis. No neural foraminal stenosis. L2-L3: Normal disc space and facet joints. No spinal canal stenosis. No neural foraminal stenosis. L3-L4: Normal disc space and facet joints. No spinal canal stenosis. No neural foraminal stenosis. L4-L5: Small disc bulge with facet hypertrophy. No spinal canal stenosis. Unchanged moderate left neural foraminal stenosis. L5-S1: Normal disc space and mild facet arthrosis. No spinal canal stenosis. No neural foraminal stenosis. Visualized sacrum: Normal. IMPRESSION: 1. No epidural abscess or other acute abnormality of the thoracic or lumbar spine. 2. Diffuse  bone marrow heterogeneity consistent with ported diagnosis of multiple myeloma. 3. Small focus of hyperintense T2-weighted signal in the spinal cord at T2-3, T5-6 and T6-7. 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 4. Chronic compression fractures at L1, L2 and L3, now status post augmentation. 5. Unchanged moderate left L4-5 neural foraminal stenosis. 6. Distended urinary bladder. Correlate for urinary retention. Electronically Signed    By: Ulyses Jarred M.D.   On: 03/07/2022 19:30   MR THORACIC SPINE W WO CONTRAST  Result Date: 03/07/2022 CLINICAL DATA:  Mid back pain with suspected epidural abscess. EXAM: MRI THORACIC AND LUMBAR SPINE WITHOUT AND WITH CONTRAST TECHNIQUE: Multiplanar and multiecho pulse sequences of the thoracic and lumbar spine were obtained without and with intravenous contrast. CONTRAST:  62m GADAVIST GADOBUTROL 1 MMOL/ML IV SOLN COMPARISON:  Lumbar spine MRI 01/04/2022 FINDINGS: MRI THORACIC SPINE FINDINGS Alignment:  Physiologic. Vertebrae: Diffuse bone marrow heterogeneity.  No focal lesion. Cord: Small focus of hyperintense T2-weighted signal in the spinal cord at T2-3, T5-6 and T6-7. Paraspinal and other soft tissues: Negative. Disc levels: Multilevel degenerative disc disease without spinal canal stenosis or neural impingement. MRI LUMBAR SPINE FINDINGS Segmentation:  Standard. Alignment:  Physiologic. Vertebrae: Diffuse bone marrow signal heterogeneity. Since 01/04/2022, there been compression fractures at L1, L2 and L3, now status post augmentation no acute lesion. Conus medullaris: Extends to the L1 level and appears normal. Paraspinal and other soft tissues: Distended urinary bladder Disc levels: L1-L2: Normal disc space and facet joints. No spinal canal stenosis. No neural foraminal stenosis. L2-L3: Normal disc space and facet joints. No spinal canal stenosis. No neural foraminal stenosis. L3-L4: Normal disc space and facet joints. No spinal canal stenosis. No neural foraminal stenosis. L4-L5: Small disc bulge with facet hypertrophy. No spinal canal stenosis. Unchanged moderate left neural foraminal stenosis. L5-S1: Normal disc space and mild facet arthrosis. No spinal canal stenosis. No neural foraminal stenosis. Visualized sacrum: Normal. IMPRESSION: 1. No epidural abscess or other acute abnormality of the thoracic or lumbar spine. 2. Diffuse bone marrow heterogeneity consistent with ported diagnosis of  multiple myeloma. 3. Small focus of hyperintense T2-weighted signal in the spinal cord at T2-3, T5-6 and T6-7. 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 4. Chronic compression fractures at L1, L2 and L3, now status post augmentation. 5. Unchanged moderate left L4-5 neural foraminal stenosis. 6. Distended urinary bladder. Correlate for urinary retention. Electronically Signed   By: KUlyses JarredM.D.   On: 03/07/2022 19:30        Scheduled Meds:  allopurinol  300 mg Oral Daily   aspirin EC  81 mg Oral Daily   enoxaparin (LOVENOX) injection  40 mg Subcutaneous Q24H   hydrochlorothiazide  25 mg Oral Daily   [START ON 03/09/2022] influenza vac split quadrivalent PF  0.5 mL Intramuscular Tomorrow-1000   lenalidomide  25 mg Oral Daily   lisinopril  40 mg Oral Daily   metoprolol tartrate  50 mg Oral BID   morphine  60 mg Oral Q8H   pantoprazole  40 mg Oral Daily   polyethylene glycol  17 g Oral Daily   rosuvastatin  10 mg Oral Daily   senna-docusate  1 tablet Oral BID   tiZANidine  4 mg Oral TID   Continuous Infusions:  methocarbamol (ROBAXIN) IV 500 mg (03/08/22 1412)     LOS: 0 days    Time spent: 367ms    JeKathie DikeMD [facility]   If 7PM-7AM, please contact night-coverage [email or web address]  03/08/2022, 5:32  PM

## 2022-03-08 NOTE — Evaluation (Signed)
Occupational Therapy Evaluation Patient Details Name: [name] MRN: [id number] DOB: 07-Apr-1971 Today's Date: 03/08/2022   History of Present Illness [name] is a 51 y.o. male with medical history significant for IgG kappa multiple myeloma on active treatment with Velcade/Revlimid, pathologic left humeral fracture (s/p IM nail 02/22/2022) and L1, L2, L3 compression fractures s/p radiofrequency ablation and kyphoplasty (L1, L2 on 02/19/2022 and L3 on 02/05/2022), HTN, anemia of chronic disease who presented to the ED for evaluation of worsening lower back pain.  Patient was recently hospitalized 02/17/2022-02/24/2022 for pathologic proximal left humeral fracture and lumbar L1-2 compression fractures.  He underwent IR kyphoplasty on 9/27 in the left humeral shaft medullary nailing 9/30.   Clinical Impression   Patient is a 51 year old male who was admitted for above. Patient was living at home alone prior level.  Currently, patient is in extreme levels of pain effecting all movements with patient unable to tolerate sitting on EOB longer than a few seconds with yelling out in pain with all current meds onboard. Patient is unable to transition home safely at current assist levels. Patient was noted to have decreased functional activity tolernace, decreased ROM, decreased BUE strength, increased pain levels, decreased endurance, decreased sitting balance, decreased standing balanced, decreased safety awareness, and decreased knowledge of AE/AD impacting participation in ADLs. Patient would continue to benefit from skilled OT services at this time while admitted and after d/c to address noted deficits in order to improve overall safety and independence in ADLs. Patient would continue to benefit from skilled OT services at this time while admitted and after d/c to address noted deficits in order to improve overall safety and independence in ADLs.        Recommendations for follow up therapy are one  component of a multi-disciplinary discharge planning process, led by the attending physician.  Recommendations may be updated based on patient status, additional functional criteria and insurance authorization.   Follow Up Recommendations  Skilled nursing-short term rehab (<3 hours/day)    Assistance Recommended at Discharge Frequent or constant Supervision/Assistance  Patient can return home with the following Two people to help with walking and/or transfers;Two people to help with bathing/dressing/bathroom;Direct supervision/assist for medications management;Help with stairs or ramp for entrance;Assist for transportation;Direct supervision/assist for financial management;Assistance with cooking/housework    Functional Status Assessment  Patient has had a recent decline in their functional status and demonstrates the ability to make significant improvements in function in a reasonable and predictable amount of time.  Equipment Recommendations  Other (comment) (defer to next venue)    Recommendations for Other Services       Precautions / Restrictions Precautions Precautions: Back;Fall Precaution Comments: able to recall back precautions Required Braces or Orthoses: Spinal Brace Spinal Brace: Thoracolumbosacral orthotic Restrictions Weight Bearing Restrictions: No LUE Weight Bearing: Weight bearing as tolerated Other Position/Activity Restrictions: per MD op note on 10/1. pt okay to be out of LUE sling as tolerated, WBAT      Mobility Bed Mobility Overal bed mobility: Needs Assistance Bed Mobility: Supine to Sit, Sit to Supine   Sidelying to sit: Mod assist, +2 for physical assistance       General bed mobility comments: Pt got to sitting and then had to lay back down due to severe pain and cramping    Transfers                          Balance Overall balance assessment:  Needs assistance Sitting-balance support: Feet supported, Bilateral upper extremity  supported Sitting balance-Leahy Scale: Poor Sitting balance - Comments: pain a limiting factor and only sat very briefly                                   ADL either performed or assessed with clinical judgement   ADL Overall ADL's : Needs assistance/impaired Eating/Feeding: Minimal assistance;Bed level Eating/Feeding Details (indicate cue type and reason): educated on importance of attempting to sit upright as possible for intake to prevent aspiration. patient and family in room verbalized understanding. Grooming: Minimal assistance;Bed level   Upper Body Bathing: Moderate assistance;Bed level   Lower Body Bathing: Total assistance;Bed level   Upper Body Dressing : Bed level;Maximal assistance   Lower Body Dressing: Total assistance;Bed level   Toilet Transfer: +2 for physical assistance;+2 for safety/equipment Toilet Transfer Details (indicate cue type and reason): patient was unable to tolerate sitting EOB with increased calling out in pain with attempt. able to sit for about 20 seconds prior to returning back to bed at this time with all pain medications on board. Toileting- Clothing Manipulation and Hygiene: Total assistance;Bed level               Vision   Vision Assessment?: No apparent visual deficits     Perception     Praxis      Pertinent Vitals/Pain Pain Assessment Pain Score: 6  Pain Location: back and LUE shoulder Pain Descriptors / Indicators: Sore, Aching, Grimacing     Hand Dominance Right   Extremity/Trunk Assessment Upper Extremity Assessment Upper Extremity Assessment: Defer to OT evaluation LUE Deficits / Details: LUE sling PRN, use as tolerated unable to fully assess ROM with patients current pain levels. able to use to sit on EOB LUE: Unable to fully assess due to pain   Lower Extremity Assessment Lower Extremity Assessment: Defer to PT evaluation   Cervical / Trunk Assessment Cervical / Trunk Assessment: Back  Surgery Cervical / Trunk Exceptions: back pain, recent back surgery   Communication Communication Communication: No difficulties   Cognition Arousal/Alertness: Awake/alert Behavior During Therapy: WFL for tasks assessed/performed, Restless Overall Cognitive Status: Within Functional Limits for tasks assessed                                 General Comments: two sisters present during session as well.     General Comments       Exercises     Shoulder Instructions      Home Living Family/patient expects to be discharged to:: Private residence Living Arrangements: Alone Available Help at Discharge: Friend(s);Available PRN/intermittently Type of Home: Apartment Home Access: Stairs to enter Entrance Stairs-Number of Steps: 4 Entrance Stairs-Rails: Right Home Layout: One level     Bathroom Shower/Tub: Tub/shower unit         Home Equipment: None          Prior Functioning/Environment Prior Level of Function : Independent/Modified Independent               ADLs Comments: TLSO MI per hosptial d/c.        OT Problem List: Decreased strength;Decreased activity tolerance;Impaired balance (sitting and/or standing);Decreased knowledge of use of DME or AE;Decreased knowledge of precautions;Impaired UE functional use;Pain      OT Treatment/Interventions: Self-care/ADL training;Therapeutic exercise;DME and/or AE instruction;Therapeutic activities;Patient/family education;Balance training  OT Goals(Current goals can be found in the care plan section) Acute Rehab OT Goals Patient Stated Goal: to get pain under control OT Goal Formulation: With patient Time For Goal Achievement: 03/22/22 Potential to Achieve Goals: Fair  OT Frequency: Min 2X/week    Co-evaluation PT/OT/SLP Co-Evaluation/Treatment: Yes Reason for Co-Treatment: For patient/therapist safety PT goals addressed during session: Mobility/safety with mobility OT goals addressed during  session: ADL's and self-care      AM-PAC OT "6 Clicks" Daily Activity     Outcome Measure Help from another person eating meals?: A Little Help from another person taking care of personal grooming?: A Lot Help from another person toileting, which includes using toliet, bedpan, or urinal?: Total Help from another person bathing (including washing, rinsing, drying)?: Total Help from another person to put on and taking off regular upper body clothing?: Total Help from another person to put on and taking off regular lower body clothing?: Total 6 Click Score: 9   End of Session Equipment Utilized During Treatment: Back brace Nurse Communication: Mobility status  Activity Tolerance: Patient tolerated treatment well Patient left: in bed;with call bell/phone within reach;with family/visitor present;with bed alarm set  OT Visit Diagnosis: Unsteadiness on feet (R26.81);Muscle weakness (generalized) (M62.81);History of falling (Z91.81);Pain Pain - Right/Left: Left Pain - part of body: Shoulder                Time: 1221-1240 OT Time Calculation (min): 19 min Charges:  OT Evaluation $OT Eval Moderate Complexity: 1 Mod  [name] OTR/L, MS Acute Rehabilitation Department Office# [phone]   [name] 03/08/2022, 3:48 PM

## 2022-03-08 NOTE — Evaluation (Signed)
Physical Therapy Evaluation Patient Details Name: [name] MRN: [id number] DOB: July 15, 1970 Today's Date: 03/08/2022  History of Present Illness  [name] is a 51 y.o. male with medical history significant for IgG kappa multiple myeloma on active treatment with Velcade/Revlimid, pathologic left humeral fracture (s/p IM nail 02/22/2022) and L1, L2, L3 compression fractures s/p radiofrequency ablation and kyphoplasty (L1, L2 on 02/19/2022 and L3 on 02/05/2022), HTN, anemia of chronic disease who presented to the ED for evaluation of worsening lower back pain.  Patient was recently hospitalized 02/17/2022-02/24/2022 for pathologic proximal left humeral fracture and lumbar L1-2 compression fractures.  He underwent IR kyphoplasty on 9/27 in the left humeral shaft medullary nailing 9/30.  Clinical Impression  Pt admitted with above diagnosis.  Pt currently with functional limitations due to the deficits listed below (see PT Problem List). Pt will benefit from skilled PT to increase their independence and safety with mobility to allow discharge to the venue listed below.  Pt is limited by pain and muscle spasms.  Once this is under control, feel that pt's mobility will improve. Recommend HHPT.        Recommendations for follow up therapy are one component of a multi-disciplinary discharge planning process, led by the attending physician.  Recommendations may be updated based on patient status, additional functional criteria and insurance authorization.  Follow Up Recommendations Home health PT      Assistance Recommended at Discharge Set up Supervision/Assistance  Patient can return home with the following  A little help with walking and/or transfers;A little help with bathing/dressing/bathroom    Equipment Recommendations None recommended by PT  Recommendations for Other Services       Functional Status Assessment       Precautions / Restrictions Precautions Precautions:  Back;Fall Spinal Brace: Thoracolumbosacral orthotic Restrictions LUE Weight Bearing: Weight bearing as tolerated      Mobility  Bed Mobility   Bed Mobility: Rolling, Sidelying to Sit   Sidelying to sit: Mod assist, +2 for physical assistance     Sit to sidelying: Min assist General bed mobility comments: Pt got to sitting and then had to lay back down due to severe pain and cramping    Transfers                        Ambulation/Gait                  Stairs            Wheelchair Mobility    Modified Rankin (Stroke Patients Only)       Balance       Sitting balance - Comments: pain a limiting factor and only sat very briefly                                     Pertinent Vitals/Pain Pain Assessment Pain Assessment: 0-10 Pain Score: 6  Pain Location: back and LUE shoulder at rest, with movement 10/10 with cramping and grabbing Pain Descriptors / Indicators: Sore, Aching, Grimacing, Cramping Pain Intervention(s): Limited activity within patient's tolerance, Monitored during session, Premedicated before session, Repositioned    Home Living Family/patient expects to be discharged to:: Private residence Living Arrangements: Alone Available Help at Discharge: Friend(s);Available PRN/intermittently Type of Home: Apartment Home Access: Stairs to enter Entrance Stairs-Rails: Right Entrance Stairs-Number of Steps: 4   Home Layout: One level Home Equipment:  None      Prior Function Prior Level of Function : Independent/Modified Independent               ADLs Comments: TLSO MI per hosptial d/c.     Hand Dominance   Dominant Hand: Right    Extremity/Trunk Assessment   Upper Extremity Assessment Upper Extremity Assessment: Defer to OT evaluation    Lower Extremity Assessment Lower Extremity Assessment: Overall WFL for tasks assessed    Cervical / Trunk Assessment Cervical / Trunk Assessment: Back  Surgery Cervical / Trunk Exceptions: back pain, recent back surgery  Communication   Communication: No difficulties  Cognition Arousal/Alertness: Awake/alert Behavior During Therapy: WFL for tasks assessed/performed, Restless Overall Cognitive Status: Within Functional Limits for tasks assessed                                          General Comments      Exercises     Assessment/Plan    PT Assessment Patient needs continued PT services  PT Problem List Decreased activity tolerance;Decreased mobility       PT Treatment Interventions DME instruction;Gait training;Stair training;Functional mobility training;Balance training;Therapeutic exercise;Therapeutic activities;Patient/family education;Neuromuscular re-education    PT Goals (Current goals can be found in the Care Plan section)  Acute Rehab PT Goals Patient Stated Goal: decrease pain PT Goal Formulation: With patient/family Time For Goal Achievement: 03/15/22 Potential to Achieve Goals: Good    Frequency Min 3X/week     Co-evaluation PT/OT/SLP Co-Evaluation/Treatment: Yes Reason for Co-Treatment: For patient/therapist safety PT goals addressed during session: Mobility/safety with mobility         AM-PAC PT "6 Clicks" Mobility  Outcome Measure Help needed turning from your back to your side while in a flat bed without using bedrails?: None Help needed moving from lying on your back to sitting on the side of a flat bed without using bedrails?: A Little Help needed moving to and from a bed to a chair (including a wheelchair)?: A Lot Help needed standing up from a chair using your arms (e.g., wheelchair or bedside chair)?: A Lot Help needed to walk in hospital room?: A Lot Help needed climbing 3-5 steps with a railing? : A Lot 6 Click Score: 15    End of Session Equipment Utilized During Treatment: Other (comment) (pt had to return supine so quickly due to pain-unable to don TLSO) Activity  Tolerance: Patient limited by pain Patient left: in bed;with call bell/phone within reach;with family/visitor present Nurse Communication: Mobility status;Patient requests pain meds (pt wanted something for spasms) PT Visit Diagnosis: Other abnormalities of gait and mobility (R26.89);Pain    Time: 4643-1427 PT Time Calculation (min) (ACUTE ONLY): 20 min   Charges:   PT Evaluation $PT Eval Moderate Complexity: 1 Mod          Kally Cadden L. Tamala Julian, PT  03/08/2022   Galen Manila 03/08/2022, 1:16 PM

## 2022-03-08 NOTE — Progress Notes (Signed)
MD notified and states ok for K-pad application. K-pad ordered.

## 2022-03-09 DIAGNOSIS — M5459 Other low back pain: Secondary | ICD-10-CM | POA: Diagnosis not present

## 2022-03-09 DIAGNOSIS — M545 Low back pain, unspecified: Secondary | ICD-10-CM | POA: Diagnosis not present

## 2022-03-09 DIAGNOSIS — C9000 Multiple myeloma not having achieved remission: Secondary | ICD-10-CM | POA: Diagnosis not present

## 2022-03-09 DIAGNOSIS — I10 Essential (primary) hypertension: Secondary | ICD-10-CM | POA: Diagnosis not present

## 2022-03-09 MED ORDER — TIZANIDINE HCL 4 MG PO TABS
4.0000 mg | ORAL_TABLET | Freq: Four times a day (QID) | ORAL | Status: DC
  Administered 2022-03-09 – 2022-03-10 (×4): 4 mg via ORAL
  Filled 2022-03-09 (×4): qty 1

## 2022-03-09 MED ORDER — PANTOPRAZOLE SODIUM 40 MG PO TBEC
40.0000 mg | DELAYED_RELEASE_TABLET | Freq: Every day | ORAL | Status: DC
  Administered 2022-03-10 – 2022-03-17 (×8): 40 mg via ORAL
  Filled 2022-03-09 (×8): qty 1

## 2022-03-09 MED ORDER — SENNOSIDES-DOCUSATE SODIUM 8.6-50 MG PO TABS
1.0000 | ORAL_TABLET | Freq: Two times a day (BID) | ORAL | Status: DC
  Administered 2022-03-09 – 2022-03-10 (×2): 1 via ORAL
  Filled 2022-03-09 (×2): qty 1

## 2022-03-09 MED ORDER — SODIUM CHLORIDE 0.9 % IV SOLN: INTRAVENOUS | Status: DC | PRN

## 2022-03-09 MED ORDER — ASPIRIN 81 MG PO TBEC
81.0000 mg | DELAYED_RELEASE_TABLET | Freq: Every day | ORAL | Status: DC
  Administered 2022-03-10 – 2022-03-17 (×8): 81 mg via ORAL
  Filled 2022-03-09 (×8): qty 1

## 2022-03-09 MED ORDER — MAGNESIUM HYDROXIDE 400 MG/5ML PO SUSP
30.0000 mL | ORAL | Status: AC
  Administered 2022-03-09: 30 mL via ORAL
  Filled 2022-03-09: qty 30

## 2022-03-09 MED ORDER — ALLOPURINOL 300 MG PO TABS
300.0000 mg | ORAL_TABLET | Freq: Every day | ORAL | Status: DC
  Administered 2022-03-10 – 2022-03-17 (×8): 300 mg via ORAL
  Filled 2022-03-09 (×8): qty 1

## 2022-03-09 MED ORDER — OXYCODONE HCL 5 MG PO TABS
15.0000 mg | ORAL_TABLET | ORAL | Status: DC
  Administered 2022-03-09 – 2022-03-10 (×6): 15 mg via ORAL
  Filled 2022-03-09 (×6): qty 3

## 2022-03-09 MED ORDER — POLYETHYLENE GLYCOL 3350 17 G PO PACK
17.0000 g | PACK | Freq: Every day | ORAL | Status: DC
  Administered 2022-03-10: 17 g via ORAL
  Filled 2022-03-09: qty 1

## 2022-03-09 MED ORDER — LENALIDOMIDE 25 MG PO CAPS
25.0000 mg | ORAL_CAPSULE | Freq: Every day | ORAL | Status: DC
  Administered 2022-03-10: 25 mg via ORAL

## 2022-03-09 MED ORDER — ROSUVASTATIN CALCIUM 10 MG PO TABS
10.0000 mg | ORAL_TABLET | Freq: Every day | ORAL | Status: DC
  Administered 2022-03-10 – 2022-03-17 (×8): 10 mg via ORAL
  Filled 2022-03-09 (×9): qty 1

## 2022-03-09 NOTE — Progress Notes (Signed)
PROGRESS NOTE    [name]  HDQ:[id number] DOB: 09/08/1970 DOA: 03/07/2022 PCP: [name], MD    Brief Narrative:  51 year old male with history of multiple myeloma, recent discharge from the hospital after he was treated for pathological proximal left humeral fracture, multiple lumbar compression fractures status post kyphoplasty.  Reports that he was discharged home and was initially doing well.  On the day before admission, he reports worsening lower back pain.  Denies any fall or obvious trauma.  MRI of the T/L-spine did not show any new acute findings.  He was admitted for symptom management with pain control.   Assessment & Plan:   Principal Problem:   Intractable low back pain Active Problems:   Traumatic compression fracture of lumbar vertebra (HCC)   Multiple myeloma not having achieved remission (HCC)   Essential hypertension   Normocytic anemia   Back pain   Intractable back pain Recent lumbar compression fracture status post kyphoplasty -Presented with recurrent intractable lower back pain -Denies any fall or other identifiable trauma -Pain does not radiate down his legs -MRI of the lumbar and thoracic spine did not show any new fractures or acute findings -He is on chronic pain medications -Reports some improvement with Robaxin, although limited -We will place on scheduled Zanaflex 4 times daily -Continue pain management -Continue physical therapy -We will request palliative care consultation to assist with pain/symptom management  Chronic pain -Chronically on MS Contin and oxycodone -We will continue home doses  Multiple myeloma -Follows with oncology, Dr. Lorenso Courier -On therapy with Velcade and Revlimid  Normocytic anemia -Suspect chronic disease related to underlying malignancy -No signs of bleeding -Hemoglobin is currently at baseline  Hypertension -Continue home regimen of lisinopril, Lopressor and hydrochlorothiazide   DVT prophylaxis:  enoxaparin (LOVENOX) injection 40 mg Start: 03/08/22 2200  Code Status: full code Family Communication: Several family present at bedside Disposition Plan: Status is: Inpatient Remains inpatient appropriate because: Requiring IV pain medicines for intractable back pain     Consultants:    Procedures:    Antimicrobials:      Subjective: Continues to have intractable pain.  Unable to get up and walk.  Objective: Vitals:   03/08/22 0326 03/08/22 0943 03/08/22 1245 03/08/22 1448  BP: 119/77 120/81 126/81 127/70  Pulse: 83 81 91 99  Resp: _0 Temp: 98 F (36.7 C) 98.6 F (37 C) 97.8 F (36.6 C) 98.4 F (36.9 C)  TempSrc: Oral Oral Oral   SpO2: 98% 99% 99% 98%  Weight:      Height:        Intake/Output Summary (Last 24 hours) at 03/08/2022 1732 Last data filed at 03/08/2022 1727 Gross per 24 hour  Intake 175 ml  Output 2000 ml  Net -1825 ml   Filed Weights   03/07/22 1322  Weight: 111.6 kg    Examination:  General exam: Appears calm and comfortable  Respiratory system: Clear to auscultation. Respiratory effort normal. Cardiovascular system: S1 & S2 heard, RRR. No JVD, murmurs, rubs, gallops or clicks. No pedal edema. Gastrointestinal system: Abdomen is nondistended, soft and nontender. No organomegaly or masses felt. Normal bowel sounds heard. Central nervous system: Alert and oriented. No focal neurological deficits. MSK: tenderness over spine as well as flanks bilaterally, seems to be superficial tenderness to palpation Skin: No rashes, lesions or ulcers Psychiatry: Judgement and insight appear normal. Mood & affect appropriate.     Data Reviewed: I have personally reviewed following labs and imaging studies  CBC: Recent Labs  Lab 03/05/22 1315 03/07/22 1415 03/08/22 0318  WBC 5.1 5.3 5.2  NEUTROABS 2.9  --   --   HGB 9.4* 9.0* 9.2*  HCT 28.6* 28.8* 29.4*  MCV 90.5 92.9 92.5  PLT 235 228 734   Basic Metabolic Panel: Recent Labs   Lab 03/05/22 1315 03/07/22 1415 03/08/22 0318  NA 133* 135 134*  K 4.3 3.9 4.0  CL 103 108 104  CO2 _0 GLUCOSE 100* 87 89  BUN 20 26* 17  CREATININE 1.17 1.18 1.01  CALCIUM 9.4 9.2 9.4   GFR: Estimated Creatinine Clearance: 105.4 mL/min (by C-G formula based on SCr of 1.01 mg/dL). Liver Function Tests: Recent Labs  Lab 03/05/22 1315  AST 18  ALT 26  ALKPHOS 64  BILITOT 0.3  PROT 10.3*  ALBUMIN 3.2*   No results for input(s): "LIPASE", "AMYLASE" in the last 168 hours. No results for input(s): "AMMONIA" in the last 168 hours. Coagulation Profile: No results for input(s): "INR", "PROTIME" in the last 168 hours. Cardiac Enzymes: No results for input(s): "CKTOTAL", "CKMB", "CKMBINDEX", "TROPONINI" in the last 168 hours. BNP (last 3 results) No results for input(s): "PROBNP" in the last 8760 hours. HbA1C: No results for input(s): "HGBA1C" in the last 72 hours. CBG: No results for input(s): "GLUCAP" in the last 168 hours. Lipid Profile: No results for input(s): "CHOL", "HDL", "LDLCALC", "TRIG", "CHOLHDL", "LDLDIRECT" in the last 72 hours. Thyroid Function Tests: No results for input(s): "TSH", "T4TOTAL", "FREET4", "T3FREE", "THYROIDAB" in the last 72 hours. Anemia Panel: No results for input(s): "VITAMINB12", "FOLATE", "FERRITIN", "TIBC", "IRON", "RETICCTPCT" in the last 72 hours. Sepsis Labs: No results for input(s): "PROCALCITON", "LATICACIDVEN" in the last 168 hours.  No results found for this or any previous visit (from the past 240 hour(s)).       Radiology Studies: MR Lumbar Spine W Wo Contrast  Result Date: 03/07/2022 CLINICAL DATA:  Mid back pain with suspected epidural abscess. EXAM: MRI THORACIC AND LUMBAR SPINE WITHOUT AND WITH CONTRAST TECHNIQUE: Multiplanar and multiecho pulse sequences of the thoracic and lumbar spine were obtained without and with intravenous contrast. CONTRAST:  1m GADAVIST GADOBUTROL 1 MMOL/ML IV SOLN COMPARISON:  Lumbar  spine MRI 01/04/2022 FINDINGS: MRI THORACIC SPINE FINDINGS Alignment:  Physiologic. Vertebrae: Diffuse bone marrow heterogeneity.  No focal lesion. Cord: Small focus of hyperintense T2-weighted signal in the spinal cord at T2-3, T5-6 and T6-7. Paraspinal and other soft tissues: Negative. Disc levels: Multilevel degenerative disc disease without spinal canal stenosis or neural impingement. MRI LUMBAR SPINE FINDINGS Segmentation:  Standard. Alignment:  Physiologic. Vertebrae: Diffuse bone marrow signal heterogeneity. Since 01/04/2022, there been compression fractures at L1, L2 and L3, now status post augmentation no acute lesion. Conus medullaris: Extends to the L1 level and appears normal. Paraspinal and other soft tissues: Distended urinary bladder Disc levels: L1-L2: Normal disc space and facet joints. No spinal canal stenosis. No neural foraminal stenosis. L2-L3: Normal disc space and facet joints. No spinal canal stenosis. No neural foraminal stenosis. L3-L4: Normal disc space and facet joints. No spinal canal stenosis. No neural foraminal stenosis. L4-L5: Small disc bulge with facet hypertrophy. No spinal canal stenosis. Unchanged moderate left neural foraminal stenosis. L5-S1: Normal disc space and mild facet arthrosis. No spinal canal stenosis. No neural foraminal stenosis. Visualized sacrum: Normal. IMPRESSION: 1. No epidural abscess or other acute abnormality of the thoracic or lumbar spine. 2. Diffuse bone marrow heterogeneity consistent with ported diagnosis of multiple myeloma. 3. Small  focus of hyperintense T2-weighted signal in the spinal cord at T2-3, T5-6 and T6-7. 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 4. Chronic compression fractures at L1, L2 and L3, now status post augmentation. 5. Unchanged moderate left L4-5 neural foraminal stenosis. 6. Distended urinary bladder. Correlate for urinary retention. Electronically Signed   By: Ulyses Jarred M.D.   On: 03/07/2022 19:30   MR  THORACIC SPINE W WO CONTRAST  Result Date: 03/07/2022 CLINICAL DATA:  Mid back pain with suspected epidural abscess. EXAM: MRI THORACIC AND LUMBAR SPINE WITHOUT AND WITH CONTRAST TECHNIQUE: Multiplanar and multiecho pulse sequences of the thoracic and lumbar spine were obtained without and with intravenous contrast. CONTRAST:  4m GADAVIST GADOBUTROL 1 MMOL/ML IV SOLN COMPARISON:  Lumbar spine MRI 01/04/2022 FINDINGS: MRI THORACIC SPINE FINDINGS Alignment:  Physiologic. Vertebrae: Diffuse bone marrow heterogeneity.  No focal lesion. Cord: Small focus of hyperintense T2-weighted signal in the spinal cord at T2-3, T5-6 and T6-7. Paraspinal and other soft tissues: Negative. Disc levels: Multilevel degenerative disc disease without spinal canal stenosis or neural impingement. MRI LUMBAR SPINE FINDINGS Segmentation:  Standard. Alignment:  Physiologic. Vertebrae: Diffuse bone marrow signal heterogeneity. Since 01/04/2022, there been compression fractures at L1, L2 and L3, now status post augmentation no acute lesion. Conus medullaris: Extends to the L1 level and appears normal. Paraspinal and other soft tissues: Distended urinary bladder Disc levels: L1-L2: Normal disc space and facet joints. No spinal canal stenosis. No neural foraminal stenosis. L2-L3: Normal disc space and facet joints. No spinal canal stenosis. No neural foraminal stenosis. L3-L4: Normal disc space and facet joints. No spinal canal stenosis. No neural foraminal stenosis. L4-L5: Small disc bulge with facet hypertrophy. No spinal canal stenosis. Unchanged moderate left neural foraminal stenosis. L5-S1: Normal disc space and mild facet arthrosis. No spinal canal stenosis. No neural foraminal stenosis. Visualized sacrum: Normal. IMPRESSION: 1. No epidural abscess or other acute abnormality of the thoracic or lumbar spine. 2. Diffuse bone marrow heterogeneity consistent with ported diagnosis of multiple myeloma. 3. Small focus of hyperintense  T2-weighted signal in the spinal cord at T2-3, T5-6 and T6-7. 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 4. Chronic compression fractures at L1, L2 and L3, now status post augmentation. 5. Unchanged moderate left L4-5 neural foraminal stenosis. 6. Distended urinary bladder. Correlate for urinary retention. Electronically Signed   By: KUlyses JarredM.D.   On: 03/07/2022 19:30        Scheduled Meds:  allopurinol  300 mg Oral Daily   aspirin EC  81 mg Oral Daily   enoxaparin (LOVENOX) injection  40 mg Subcutaneous Q24H   hydrochlorothiazide  25 mg Oral Daily   [START ON 03/09/2022] influenza vac split quadrivalent PF  0.5 mL Intramuscular Tomorrow-1000   lenalidomide  25 mg Oral Daily   lisinopril  40 mg Oral Daily   metoprolol tartrate  50 mg Oral BID   morphine  60 mg Oral Q8H   pantoprazole  40 mg Oral Daily   polyethylene glycol  17 g Oral Daily   rosuvastatin  10 mg Oral Daily   senna-docusate  1 tablet Oral BID   tiZANidine  4 mg Oral TID   Continuous Infusions:  methocarbamol (ROBAXIN) IV 500 mg (03/08/22 1412)     LOS: 0 days    Time spent: 345ms    JeKathie DikeMD [facility]   If 7PM-7AM, please contact night-coverage [email or web address]  03/08/2022, 5:32 PM

## 2022-03-09 NOTE — Plan of Care (Signed)
  Problem: Education: Goal: Knowledge of General Education information will improve Description Including pain rating scale, medication(s)/side effects and non-pharmacologic comfort measures Outcome: Progressing   

## 2022-03-10 ENCOUNTER — Other Ambulatory Visit: Payer: Self-pay | Admitting: *Deleted

## 2022-03-10 DIAGNOSIS — Z515 Encounter for palliative care: Secondary | ICD-10-CM | POA: Diagnosis not present

## 2022-03-10 DIAGNOSIS — M545 Low back pain, unspecified: Secondary | ICD-10-CM | POA: Diagnosis not present

## 2022-03-10 DIAGNOSIS — M5459 Other low back pain: Secondary | ICD-10-CM | POA: Diagnosis not present

## 2022-03-10 DIAGNOSIS — I10 Essential (primary) hypertension: Secondary | ICD-10-CM | POA: Diagnosis not present

## 2022-03-10 DIAGNOSIS — G8929 Other chronic pain: Secondary | ICD-10-CM

## 2022-03-10 DIAGNOSIS — C9000 Multiple myeloma not having achieved remission: Secondary | ICD-10-CM

## 2022-03-10 DIAGNOSIS — N179 Acute kidney failure, unspecified: Secondary | ICD-10-CM | POA: Diagnosis not present

## 2022-03-10 MED ORDER — LENALIDOMIDE 25 MG PO CAPS: 25.0000 mg | ORAL_CAPSULE | Freq: Every day | ORAL | 0 refills | Status: DC

## 2022-03-10 MED ORDER — TIZANIDINE HCL 4 MG PO TABS
4.0000 mg | ORAL_TABLET | Freq: Three times a day (TID) | ORAL | Status: DC
  Administered 2022-03-10 – 2022-03-14 (×11): 4 mg via ORAL
  Filled 2022-03-10 (×11): qty 1

## 2022-03-10 MED ORDER — METOPROLOL TARTRATE 25 MG PO TABS
25.0000 mg | ORAL_TABLET | Freq: Two times a day (BID) | ORAL | Status: DC
  Administered 2022-03-10: 25 mg via ORAL
  Filled 2022-03-10: qty 1

## 2022-03-10 MED ORDER — POLYETHYLENE GLYCOL 3350 17 G PO PACK
17.0000 g | PACK | Freq: Two times a day (BID) | ORAL | Status: DC
  Administered 2022-03-10 – 2022-03-13 (×6): 17 g via ORAL
  Filled 2022-03-10 (×6): qty 1

## 2022-03-10 MED ORDER — GABAPENTIN 100 MG PO CAPS
100.0000 mg | ORAL_CAPSULE | Freq: Every day | ORAL | Status: DC
  Administered 2022-03-10 – 2022-03-16 (×7): 100 mg via ORAL
  Filled 2022-03-10 (×7): qty 1

## 2022-03-10 MED ORDER — DEXAMETHASONE SODIUM PHOSPHATE 10 MG/ML IJ SOLN
10.0000 mg | Freq: Once | INTRAMUSCULAR | Status: AC
  Administered 2022-03-10: 10 mg via INTRAVENOUS
  Filled 2022-03-10: qty 1

## 2022-03-10 MED ORDER — SENNOSIDES-DOCUSATE SODIUM 8.6-50 MG PO TABS
2.0000 | ORAL_TABLET | Freq: Two times a day (BID) | ORAL | Status: DC
  Administered 2022-03-10 – 2022-03-17 (×14): 2 via ORAL
  Filled 2022-03-10 (×14): qty 2

## 2022-03-10 MED ORDER — OXYCODONE HCL 5 MG PO TABS
15.0000 mg | ORAL_TABLET | Freq: Four times a day (QID) | ORAL | Status: DC
  Administered 2022-03-10 – 2022-03-12 (×8): 15 mg via ORAL
  Filled 2022-03-10 (×8): qty 3

## 2022-03-10 MED ORDER — DEXAMETHASONE 4 MG PO TABS
4.0000 mg | ORAL_TABLET | Freq: Every day | ORAL | Status: AC
  Administered 2022-03-11 – 2022-03-12 (×2): 4 mg via ORAL
  Filled 2022-03-10 (×2): qty 1

## 2022-03-10 MED ORDER — MAGNESIUM HYDROXIDE 400 MG/5ML PO SUSP
30.0000 mL | Freq: Every day | ORAL | Status: DC
  Administered 2022-03-10: 30 mL via ORAL
  Filled 2022-03-10: qty 30

## 2022-03-10 NOTE — Progress Notes (Signed)
PROGRESS NOTE    JULIE PAOLINI  DXA:[id number] DOB: May 25, 1971 DOA: 03/07/2022 PCP: [name], MD    Brief Narrative:  51 year old male with history of multiple myeloma, recent discharge from the hospital after he was treated for pathological proximal left humeral fracture, multiple lumbar compression fractures status post kyphoplasty.  Reports that he was discharged home and was initially doing well.  On the day before admission, he reports worsening lower back pain.  Denies any fall or obvious trauma.  MRI of the T/L-spine did not show any new acute findings.  He was admitted for symptom management with pain control.   Assessment & Plan:   Principal Problem:   Intractable low back pain Active Problems:   Traumatic compression fracture of lumbar vertebra (HCC)   Multiple myeloma not having achieved remission (HCC)   Essential hypertension   Normocytic anemia   Back pain   Intractable back pain Recent lumbar compression fracture status post kyphoplasty -Presented with recurrent intractable lower back pain -Denies any fall or other identifiable trauma -Pain does not radiate down his legs -MRI of the lumbar and thoracic spine did not show any new fractures or acute findings -He is on chronic pain medications -Reports some improvement with Robaxin, although limited -He is on Zanaflex scheduled -Continue pain management -Continue physical therapy -Appreciate palliative care assistance -Started on gabapentin and Decadron -tapering down opiates  Chronic pain -Chronically on MS Contin and oxycodone -We will continue home doses  Multiple myeloma -Follows with oncology, Dr. Lorenso Courier -On therapy with Velcade and Revlimid -Defer to oncology regarding timing of next treatments  Normocytic anemia -Suspect chronic disease related to underlying malignancy -No signs of bleeding -Hemoglobin is currently at baseline  Hypertension -Continue home regimen of Lopressor  -HCTZ  and lisinopril currently on hold due to borderline blood pressures  Constipation -Increase MiraLAX, senna -Continue daily milk of mag -May need to consider suppository/enema if he does not have a bowel movement soon   DVT prophylaxis: enoxaparin (LOVENOX) injection 40 mg Start: 03/08/22 2200  Code Status: full code Family Communication: Family present at bedside Disposition Plan: Status is: Inpatient Remains inpatient appropriate because: Requiring IV pain medicines for intractable back pain     Consultants:  Palliative care  Procedures:    Antimicrobials:      Subjective: He says his pain is a little better controlled today.  He has difficulty walking.  Family notes that he has been more sleepy today.  Objective: Vitals:   03/09/22 2050 03/10/22 0550 03/10/22 1122 03/10/22 1328  BP: (!) 91/57 (!) 109/54 110/60 105/65  Pulse: 77 84 90 88  Resp:  18  16  Temp: 100.1 F (37.8 C) 100.1 F (37.8 C)  98.5 F (36.9 C)  TempSrc: Oral   Oral  SpO2: 94% 91%  90%  Weight:      Height:        Intake/Output Summary (Last 24 hours) at 03/10/2022 1610 Last data filed at 03/10/2022 1113 Gross per 24 hour  Intake 467.81 ml  Output 1900 ml  Net -1432.19 ml   Filed Weights   03/07/22 1322  Weight: 111.6 kg    Examination:  General exam: Appears calm and comfortable  Respiratory system: Clear to auscultation. Respiratory effort normal. Cardiovascular system: S1 & S2 heard, RRR. No JVD, murmurs, rubs, gallops or clicks. No pedal edema. Gastrointestinal system: Abdomen is nondistended, soft and nontender. No organomegaly or masses felt. Normal bowel sounds heard. Central nervous system: Alert and oriented. No  focal neurological deficits. MSK: tenderness over spine as well as flanks bilaterally, seems to be superficial tenderness to palpation Skin: No rashes, lesions or ulcers Psychiatry: Judgement and insight appear normal. Mood & affect appropriate.     Data  Reviewed: I have personally reviewed following labs and imaging studies  CBC: Recent Labs  Lab 03/05/22 1315 03/07/22 1415 03/08/22 0318  WBC 5.1 5.3 5.2  NEUTROABS 2.9  --   --   HGB 9.4* 9.0* 9.2*  HCT 28.6* 28.8* 29.4*  MCV 90.5 92.9 92.5  PLT 235 228 272   Basic Metabolic Panel: Recent Labs  Lab 03/05/22 1315 03/07/22 1415 03/08/22 0318  NA 133* 135 134*  K 4.3 3.9 4.0  CL 103 108 104  CO2 $Re'25 22 24  'Ebl$ GLUCOSE 100* 87 89  BUN 20 26* 17  CREATININE 1.17 1.18 1.01  CALCIUM 9.4 9.2 9.4   GFR: Estimated Creatinine Clearance: 105.4 mL/min (by C-G formula based on SCr of 1.01 mg/dL). Liver Function Tests: Recent Labs  Lab 03/05/22 1315  AST 18  ALT 26  ALKPHOS 64  BILITOT 0.3  PROT 10.3*  ALBUMIN 3.2*   No results for input(s): "LIPASE", "AMYLASE" in the last 168 hours. No results for input(s): "AMMONIA" in the last 168 hours. Coagulation Profile: No results for input(s): "INR", "PROTIME" in the last 168 hours. Cardiac Enzymes: No results for input(s): "CKTOTAL", "CKMB", "CKMBINDEX", "TROPONINI" in the last 168 hours. BNP (last 3 results) No results for input(s): "PROBNP" in the last 8760 hours. HbA1C: No results for input(s): "HGBA1C" in the last 72 hours. CBG: No results for input(s): "GLUCAP" in the last 168 hours. Lipid Profile: No results for input(s): "CHOL", "HDL", "LDLCALC", "TRIG", "CHOLHDL", "LDLDIRECT" in the last 72 hours. Thyroid Function Tests: No results for input(s): "TSH", "T4TOTAL", "FREET4", "T3FREE", "THYROIDAB" in the last 72 hours. Anemia Panel: No results for input(s): "VITAMINB12", "FOLATE", "FERRITIN", "TIBC", "IRON", "RETICCTPCT" in the last 72 hours. Sepsis Labs: No results for input(s): "PROCALCITON", "LATICACIDVEN" in the last 168 hours.  No results found for this or any previous visit (from the past 240 hour(s)).       Radiology Studies: No results found.      Scheduled Meds:  allopurinol  300 mg Oral Daily    aspirin EC  81 mg Oral Daily   [START ON 03/11/2022] dexamethasone  4 mg Oral Daily   enoxaparin (LOVENOX) injection  40 mg Subcutaneous Q24H   gabapentin  100 mg Oral QHS   influenza vac split quadrivalent PF  0.5 mL Intramuscular Tomorrow-1000   lenalidomide  25 mg Oral Daily   metoprolol tartrate  50 mg Oral BID   morphine  60 mg Oral Q8H   oxyCODONE  15 mg Oral Q6H   pantoprazole  40 mg Oral Daily   polyethylene glycol  17 g Oral BID   rosuvastatin  10 mg Oral Daily   senna-docusate  2 tablet Oral BID   tiZANidine  4 mg Oral TID   Continuous Infusions:  sodium chloride Stopped (03/09/22 1733)   methocarbamol (ROBAXIN) IV 500 mg (03/10/22 1144)     LOS: 2 days    Time spent: 70mins    Kathie Dike, MD [facility]   If 7PM-7AM, please contact night-coverage [email or web address]  03/10/2022, 4:10 PM

## 2022-03-10 NOTE — Progress Notes (Signed)
Physical Therapy Treatment Patient Details Name: [name] MRN: [id number] DOB: Jul 10, 1970 Today's Date: 03/10/2022   History of Present Illness [name] is a 51 y.o. male with medical history significant for IgG kappa multiple myeloma on active treatment with Velcade/Revlimid, pathologic left humeral fracture (s/p IM nail 02/22/2022) and L1, L2, L3 compression fractures s/p radiofrequency ablation and kyphoplasty (L1, L2 on 02/19/2022 and L3 on 02/05/2022), HTN, anemia of chronic disease who presented to the ED for evaluation of worsening lower back pain.  Patient was recently hospitalized 02/17/2022-02/24/2022 for pathologic proximal left humeral fracture and lumbar L1-2 compression fractures.  He underwent IR kyphoplasty on 9/27 in the left humeral shaft medullary nailing 9/30.    PT Comments    Pt is not progressing with PT d/t c/o severe back pain despite having had all meds prior to session. He is unable to roll in the bed. Pt yells/moans with attempts to mobilize however his facial expression remains unchanged (?).  Pt was able to get to standing with nursing, unable to pivot to [facility] however was able to take side steps toward HOB. Recommend SNF. Will continue efforts to progress pt as able.   Recommendations for follow up therapy are one component of a multi-disciplinary discharge planning process, led by the attending physician.  Recommendations may be updated based on patient status, additional functional criteria and insurance authorization.  Follow Up Recommendations  Skilled nursing-short term rehab (<3 hours/day) Can patient physically be transported by private vehicle: No   Assistance Recommended at Discharge Frequent or constant Supervision/Assistance  Patient can return home with the following Two people to help with walking and/or transfers;A lot of help with bathing/dressing/bathroom;Assistance with cooking/housework;Assist for transportation;Help with stairs or ramp for  entrance   Equipment Recommendations  Other (comment) (defer to SNF)    Recommendations for Other Services       Precautions / Restrictions Precautions Precautions: Back;Fall Precaution Comments: unable to recall back precautions, states he has never heard of log rolling Required Braces or Orthoses: Spinal Brace Spinal Brace: Thoracolumbosacral orthotic Restrictions Weight Bearing Restrictions: No Other Position/Activity Restrictions: per MD op note on 10/1. pt okay to be out of LUE sling as tolerated, WBAT     Mobility  Bed Mobility Overal bed mobility: Needs Assistance Bed Mobility: Rolling Rolling: +2 for physical assistance, +2 for safety/equipment, Total assist         General bed mobility comments: unable to complete roll d/t pain, decr pt effort, resistant to imposed movement    Transfers                        Ambulation/Gait                   Stairs             Wheelchair Mobility    Modified Rankin (Stroke Patients Only)       Balance                                            Cognition Arousal/Alertness: Awake/alert Behavior During Therapy: WFL for tasks assessed/performed, Restless Overall Cognitive Status: Within Functional Limits for tasks assessed  General Comments: pt spends an excessive amount of time talking about pain and how it is not controlled. pt is on scheduled meds, advised pt to speak with MD further regarding this        Exercises      General Comments        Pertinent Vitals/Pain Pain Assessment Pain Assessment: 0-10 Pain Score: 10-Worst pain ever Pain Location: back and LEs Pain Descriptors / Indicators: Guarding, Shooting, Moaning Pain Intervention(s): Limited activity within patient's tolerance, Monitored during session, Premedicated before session, Repositioned    Home Living                          Prior  Function            PT Goals (current goals can now be found in the care plan section) Acute Rehab PT Goals Patient Stated Goal: decrease pain PT Goal Formulation: With patient/family Time For Goal Achievement: 03/15/22 Potential to Achieve Goals: Good Progress towards PT goals: Not progressing toward goals - comment (pain)    Frequency    Min 3X/week      PT Plan Discharge plan needs to be updated    Co-evaluation              AM-PAC PT "6 Clicks" Mobility   Outcome Measure                   End of Session   Activity Tolerance: Patient limited by pain Patient left: in bed;with call bell/phone within reach;with bed alarm set Nurse Communication: Mobility status;Patient requests pain meds;Other (comment) (and nausea) PT Visit Diagnosis: Other abnormalities of gait and mobility (R26.89);Pain Pain - part of body:  (back)     Time: 0768-0881 PT Time Calculation (min) (ACUTE ONLY): 14 min  Charges:  $Therapeutic Activity: 8-22 mins                     Baxter Flattery, PT  Acute Rehab Dept [facility]) [phone]  [facility] Weekend Pager [facility] only)  [phone]  03/10/2022    [facility] 03/10/2022, 12:25 PM

## 2022-03-10 NOTE — Plan of Care (Signed)
°  Problem: Activity: °Goal: Risk for activity intolerance will decrease °Outcome: Progressing °  °Problem: Elimination: °Goal: Will not experience complications related to bowel motility °Outcome: Progressing °  °Problem: Pain Managment: °Goal: General experience of comfort will improve °Outcome: Progressing °  °Problem: Skin Integrity: °Goal: Risk for impaired skin integrity will decrease °Outcome: Progressing °  °

## 2022-03-10 NOTE — Consult Note (Signed)
Consultation Note Date: 03/10/2022   Patient Name: [name]  DOB: 12/06/1970  MRN: [id number]  Age / Sex: 51 y.o., male  PCP: Christopher Mai, MD Referring Physician: Kathie Dike, MD  Reason for Consultation:  intractable back pain  HPI/Patient Profile: 51 y.o. male  with past medical history of  IgG kappa multiple myeloma on treatment with Velcade/Revlimid, recent admission after fall with pathologic compression fractures s/p ablation and kyphoplasty and fracture of humerus. Plan is to start zometa but on hold due to tooth extraction on 10/6. Now admitted on 03/07/2022 with worsening back pain.  MRI did not show any current acute changes. Palliative medicine consulted for assistance with back pain related to cancer.    Primary Decision Maker PATIENT  Discussion: Chart reviewed including labs, progress notes, imaging. Met at bedside with patient and his sister [name].  [name] lives in [place]. 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 He enjoys cooking, he and his sister like to go to [employer] and explore new ingredients. He is a Psychologist, forensic.  Prior to this admission he was living independently, however, has had decline in functional status due to his pain.  He is currently on MS Contin 60 mg q8 hours, Oxycodone IR q4 hours, morphine 9m IV q3 hr prn and tinazidine 485mQID. Total 24 hr OME = 37539mHis home regimen was MS Contin 33m44mD with oxy IR 15 mg q8 prn. His pain is in his back. It is intermittent and comes on suddenly like an electric shock. It lasts anywhere from a few seconds to a few minutes. In between episodes he reports zero pain.  He is a bit sleepy during my interview, he nodded off a few times during our discussion. He has not had a bowel movement since Thursday prior to admission.  We discussed his goals- to start he would like his pain to be  manageable to a point that will allow him to get out of bed and participate with physical therapy and be able to function independently.    SUMMARY OF RECOMMENDATIONS -Uncontrolled pain likely related to pathologic fractures, but given his description there appears to be a neuropathic element as well - recommend following changes in regimen:   -Start gabapentin 100mg13m- can titrate up to 300mg 22mt does not make him too drowsy  -10mg I29mxamethasone today, then 4mg po 97mly x 2 days- can continue if this is helpful- continue protonix for GI prophylaxis  -Continue MS Contin 60mg q8h48monsider titrating down if dexamethasone and gabapentin are helpful  -Change oxycodone IR 15mg from59mours to q 6hrs  -Increase Senna to 2 po bid  -Increase miralax to 17gm po bid  -Decrease tizanidine from QID to TID  Code Status/Advance Care Planning: Full code   Prognosis:   Unable to determine  Discharge Planning: To Be Determined  Primary Diagnoses: Present on Admission:  Intractable low back pain  Traumatic compression fracture of lumbar vertebra (HCC)  Multiple myeloma not having achieved  remission ([place])  Essential hypertension  Normocytic anemia  Back pain     Physical Exam Vitals and nursing note reviewed.  Constitutional:      General: He is not in acute distress. Pulmonary:     Effort: Pulmonary effort is normal.  Neurological:     Mental Status: He is oriented to person, place, and time.     Comments: sleepy     Vital Signs: BP 105/65 (BP Location: Right Arm)   Pulse 88   Temp 98.5 F (36.9 C) (Oral)   Resp 16   Ht _0  (1.753 m)   Wt 111.6 kg   SpO2 90%   BMI 36.33 kg/m  Pain Scale: 0-10 POSS *See Group Information*: 1-Acceptable,Awake and alert Pain Score: 2    SpO2: SpO2: 90 % O2 Device:SpO2: 90 % O2 Flow Rate: .   IO: Intake/output summary:  Intake/Output Summary (Last 24 hours) at 03/10/2022 1431 Last data filed at 03/10/2022 1113 Gross per 24 hour   Intake 467.81 ml  Output 1900 ml  Net -1432.19 ml    LBM: Last BM Date : 03/07/22 Baseline Weight: Weight: 111.6 kg Most recent weight: Weight: 111.6 kg       Thank you for this consult. Palliative medicine will continue to follow and assist as needed.   Greater than 50%  of this time was spent counseling and coordinating care related to the above assessment and plan.  Signed by: Mariana Kaufman, AGNP-C Palliative Medicine    Please contact Palliative Medicine Team phone at [phone] for questions and concerns.  For individual provider: See Shea Evans

## 2022-03-11 ENCOUNTER — Inpatient Hospital Stay (HOSPITAL_COMMUNITY): Payer: BC Managed Care – PPO

## 2022-03-11 ENCOUNTER — Inpatient Hospital Stay
Admit: 2022-03-11 | Discharge: 2022-03-11 | Disposition: A | Discharge status: Home / Self Care | Payer: BC Managed Care – PPO | Attending: Interventional Radiology | Admitting: Interventional Radiology

## 2022-03-11 DIAGNOSIS — Z515 Encounter for palliative care: Secondary | ICD-10-CM

## 2022-03-11 DIAGNOSIS — I10 Essential (primary) hypertension: Secondary | ICD-10-CM | POA: Diagnosis not present

## 2022-03-11 DIAGNOSIS — N179 Acute kidney failure, unspecified: Secondary | ICD-10-CM | POA: Diagnosis not present

## 2022-03-11 DIAGNOSIS — C9000 Multiple myeloma not having achieved remission: Secondary | ICD-10-CM | POA: Diagnosis not present

## 2022-03-11 DIAGNOSIS — M5459 Other low back pain: Secondary | ICD-10-CM | POA: Diagnosis not present

## 2022-03-11 DIAGNOSIS — M545 Low back pain, unspecified: Secondary | ICD-10-CM | POA: Diagnosis not present

## 2022-03-11 DIAGNOSIS — S32030A Wedge compression fracture of third lumbar vertebra, initial encounter for closed fracture: Secondary | ICD-10-CM

## 2022-03-11 LAB — BASIC METABOLIC PANEL
Anion gap: 7 (ref 5–15)
BUN: 55 mg/dL — ABNORMAL HIGH (ref 6–20)
CO2: 21 mmol/L — ABNORMAL LOW (ref 22–32)
Calcium: 10.4 mg/dL — ABNORMAL HIGH (ref 8.9–10.3)
Chloride: 102 mmol/L (ref 98–111)
Creatinine, Ser: 2.01 mg/dL — ABNORMAL HIGH (ref 0.61–1.24)
GFR, Estimated: 39 mL/min — ABNORMAL LOW (ref >60)
Glucose, Bld: 136 mg/dL — ABNORMAL HIGH (ref 70–99)
Potassium: 5.1 mmol/L (ref 3.5–5.1)
Sodium: 130 mmol/L — ABNORMAL LOW (ref 135–145)

## 2022-03-11 LAB — CBC
HCT: 29.5 % — ABNORMAL LOW (ref 39.0–52.0)
Hemoglobin: 9.1 g/dL — ABNORMAL LOW (ref 13.0–17.0)
MCH: 28.7 pg (ref 26.0–34.0)
MCHC: 30.8 g/dL (ref 30.0–36.0)
MCV: 93.1 fL (ref 80.0–100.0)
Platelets: 166 10*3/uL (ref 150–400)
RBC: 3.17 MIL/uL — ABNORMAL LOW (ref 4.22–5.81)
RDW: 13.1 % (ref 11.5–15.5)
WBC: 6.9 10*3/uL (ref 4.0–10.5)
nRBC: 0 % (ref 0.0–0.2)

## 2022-03-11 MED ORDER — SODIUM CHLORIDE 0.9 % IV BOLUS
1000.0000 mL | Freq: Once | INTRAVENOUS | Status: AC
  Administered 2022-03-11: 1000 mL via INTRAVENOUS

## 2022-03-11 MED ORDER — SODIUM CHLORIDE 0.9 % IV SOLN: INTRAVENOUS | Status: DC

## 2022-03-11 MED ORDER — BISACODYL 10 MG RE SUPP
10.0000 mg | Freq: Once | RECTAL | Status: AC
  Administered 2022-03-11: 10 mg via RECTAL
  Filled 2022-03-11: qty 1

## 2022-03-11 NOTE — TOC Progression Note (Signed)
Transition of Care [facility]) - Progression Note    Patient Details  Name: [name] MRN: [id number] Date of Birth: February 09, 1971  [facility] CM/SW Contact  Lennart Pall, LCSW Phone Number: 03/11/2022, 3:38 PM  Clinical Narrative:    Met with pt today to discuss recommendation for SNF rehab.  Pt does feel he is making progress with therapy, however, agrees he is not at a functional level to return home alone and in agreement with TOC to begin bed search.  Will continue to follow.   Expected Discharge Plan: [place] Barriers to Discharge: Continued Medical Work up  Expected Discharge Plan and Services Expected Discharge Plan: [place] In-house Referral: Clinical Social Work     Living arrangements for the past 2 months: Single Family Home                                       Social Determinants of Health (SDOH) Interventions    Readmission Risk Interventions    02/19/2022    3:30 PM  Readmission Risk Prevention Plan  Post Dischage Appt Complete  Medication Screening Complete  Transportation Screening Complete

## 2022-03-11 NOTE — Progress Notes (Signed)
PROGRESS NOTE    [name]  GYJ:[id number] DOB: 1970/11/25 DOA: 03/07/2022 PCP: [name], MD    Brief Narrative:  51 year old male with history of multiple myeloma, recent discharge from the hospital after he was treated for pathological proximal left humeral fracture, multiple lumbar compression fractures status post kyphoplasty.  Reports that he was discharged home and was initially doing well.  On the day before admission, he reports worsening lower back pain.  Denies any fall or obvious trauma.  MRI of the T/L-spine did not show any new acute findings.  He was admitted for symptom management with pain control.   Assessment & Plan:   Principal Problem:   Intractable low back pain Active Problems:   Traumatic compression fracture of lumbar vertebra (HCC)   Multiple myeloma not having achieved remission (HCC)   Essential hypertension   Normocytic anemia   Back pain   Intractable back pain Recent lumbar compression fracture status post kyphoplasty -Presented with recurrent intractable lower back pain -Denies any fall or other identifiable trauma -Pain does not radiate down his legs -MRI of the lumbar and thoracic spine did not show any new fractures or acute findings -He is on chronic pain medications -Reports some improvement with Robaxin, although limited -He is on Zanaflex scheduled -Continue pain management -Continue physical therapy -Appreciate palliative care assistance -Started on gabapentin and Decadron -tapering down opiates -Appears to be slowly improving  Chronic pain -Chronically on MS Contin and oxycodone -We will continue home doses  Acute kidney injury -Nonoliguric, had 2.2 L of urine output documented yesterday -Likely precipitated by low blood pressures, decreased p.o. intake -Provide IV hydration -Renal ultrasound -Follow serial renal function  Multiple myeloma -Follows with oncology, Dr. Lorenso Courier -On therapy with Velcade and  Revlimid -Defer to oncology regarding timing of next treatments -Revlimid was continued on admission, but will hold for now with rising creatinine  Normocytic anemia -Suspect chronic disease related to underlying malignancy -No signs of bleeding -Hemoglobin is currently at baseline  Hypertension -Blood pressures are running low, will discontinue for the Lopressor -HCTZ and lisinopril currently on hold due to borderline blood pressures  Constipation -Increase MiraLAX, senna -Discontinue milk of magnesia with elevated creatinine -We will provide Dulcolax suppository   DVT prophylaxis: enoxaparin (LOVENOX) injection 40 mg Start: 03/08/22 2200  Code Status: full code Family Communication: No family present at bedside today Disposition Plan: Status is: Inpatient Remains inpatient appropriate because: Requiring IV pain medicines for intractable back pain     Consultants:  Palliative care  Procedures:    Antimicrobials:      Subjective: Overall, he says he does feel little better today.  Feels that pain is little better controlled when he can move in bed a little bit better.  He has not had a bowel movement.  Objective: Vitals:   03/10/22 1328 03/10/22 2133 03/11/22 0544 03/11/22 1135  BP: 105/65 114/63 (!) 93/58 114/60  Pulse: 88 76 64 67  Resp: _0 Temp: 98.5 F (36.9 C) 99.9 F (37.7 C) 98.3 F (36.8 C)   TempSrc: Oral Oral Oral   SpO2: 90% 95% 97%   Weight:      Height:        Intake/Output Summary (Last 24 hours) at 03/11/2022 1140 Last data filed at 03/11/2022 1021 Gross per 24 hour  Intake 1452.11 ml  Output 1550 ml  Net -97.89 ml   Filed Weights   03/07/22 1322  Weight: 111.6 kg    Examination:  General exam: Appears calm and comfortable  Respiratory system: Clear to auscultation. Respiratory effort normal. Cardiovascular system: S1 & S2 heard, RRR. No JVD, murmurs, rubs, gallops or clicks. No pedal edema. Gastrointestinal system:  Abdomen is nondistended, soft and nontender. No organomegaly or masses felt. Normal bowel sounds heard. Central nervous system: Alert and oriented. No focal neurological deficits. MSK: Continues to have tenderness over spine and bilateral flanks Skin: No rashes, lesions or ulcers Psychiatry: Judgement and insight appear normal. Mood & affect appropriate.     Data Reviewed: I have personally reviewed following labs and imaging studies  CBC: Recent Labs  Lab 03/05/22 1315 03/07/22 1415 03/08/22 0318 03/11/22 0322  WBC 5.1 5.3 5.2 6.9  NEUTROABS 2.9  --   --   --   HGB 9.4* 9.0* 9.2* 9.1*  HCT 28.6* 28.8* 29.4* 29.5*  MCV 90.5 92.9 92.5 93.1  PLT 235 228 207 595   Basic Metabolic Panel: Recent Labs  Lab 03/05/22 1315 03/07/22 1415 03/08/22 0318 03/11/22 0322  NA 133* 135 134* 130*  K 4.3 3.9 4.0 5.1  CL 103 108 104 102  CO2 _0 21*  GLUCOSE 100* 87 89 136*  BUN 20 26* 17 55*  CREATININE 1.17 1.18 1.01 2.01*  CALCIUM 9.4 9.2 9.4 10.4*   GFR: Estimated Creatinine Clearance: 53 mL/min (A) (by C-G formula based on SCr of 2.01 mg/dL (H)). Liver Function Tests: Recent Labs  Lab 03/05/22 1315  AST 18  ALT 26  ALKPHOS 64  BILITOT 0.3  PROT 10.3*  ALBUMIN 3.2*   No results for input(s): "LIPASE", "AMYLASE" in the last 168 hours. No results for input(s): "AMMONIA" in the last 168 hours. Coagulation Profile: No results for input(s): "INR", "PROTIME" in the last 168 hours. Cardiac Enzymes: No results for input(s): "CKTOTAL", "CKMB", "CKMBINDEX", "TROPONINI" in the last 168 hours. BNP (last 3 results) No results for input(s): "PROBNP" in the last 8760 hours. HbA1C: No results for input(s): "HGBA1C" in the last 72 hours. CBG: No results for input(s): "GLUCAP" in the last 168 hours. Lipid Profile: No results for input(s): "CHOL", "HDL", "LDLCALC", "TRIG", "CHOLHDL", "LDLDIRECT" in the last 72 hours. Thyroid Function Tests: No results for input(s): "TSH",  "T4TOTAL", "FREET4", "T3FREE", "THYROIDAB" in the last 72 hours. Anemia Panel: No results for input(s): "VITAMINB12", "FOLATE", "FERRITIN", "TIBC", "IRON", "RETICCTPCT" in the last 72 hours. Sepsis Labs: No results for input(s): "PROCALCITON", "LATICACIDVEN" in the last 168 hours.  No results found for this or any previous visit (from the past 240 hour(s)).       Radiology Studies: No results found.      Scheduled Meds:  allopurinol  300 mg Oral Daily   aspirin EC  81 mg Oral Daily   dexamethasone  4 mg Oral Daily   enoxaparin (LOVENOX) injection  40 mg Subcutaneous Q24H   gabapentin  100 mg Oral QHS   influenza vac split quadrivalent PF  0.5 mL Intramuscular Tomorrow-1000   morphine  60 mg Oral Q8H   oxyCODONE  15 mg Oral Q6H   pantoprazole  40 mg Oral Daily   polyethylene glycol  17 g Oral BID   rosuvastatin  10 mg Oral Daily   senna-docusate  2 tablet Oral BID   tiZANidine  4 mg Oral TID   Continuous Infusions:  sodium chloride 10 mL/hr at 03/10/22 1722   sodium chloride 100 mL/hr at 03/11/22 0639   methocarbamol (ROBAXIN) IV 500 mg (03/10/22 2342)     LOS: 3  days    Time spent: 76mns    JKathie Dike MD [facility]   If 7PM-7AM, please contact night-coverage [email or web address]  03/11/2022, 11:40 AM

## 2022-03-11 NOTE — Progress Notes (Signed)
Occupational Therapy Treatment Patient Details Name: [name] MRN: [id number] DOB: 1970/06/26 Today's Date: 03/11/2022   History of present illness [name] is a 51 y.o. male with medical history significant for IgG kappa multiple myeloma on active treatment with Velcade/Revlimid, pathologic left humeral fracture (s/p IM nail 02/22/2022) and L1, L2, L3 compression fractures s/p radiofrequency ablation and kyphoplasty (L1, L2 on 02/19/2022 and L3 on 02/05/2022), HTN, anemia of chronic disease who presented to the ED for evaluation of worsening lower back pain.  Patient was recently hospitalized 02/17/2022-02/24/2022 for pathologic proximal left humeral fracture and lumbar L1-2 compression fractures.  He underwent IR kyphoplasty on 9/27 in the left humeral shaft medullary nailing 9/30.   OT comments  Patient progressing very well today and showed improved ability to tolerate bed mobility with Min Assist to roll and Moderate assist to slowly transition from sidelying to sitting at EOB compared to previous session. Pt does shout loudly with bed mobility as pain coping strategy which, combined with pre-medication, seems to work well for pt. Pt used RW to ambulate around the room, stand at sink for 2 grooming tasks and was able to stand and manage his LE clothing in prep for [facility] transfer.  Patient remains limited by pain nd back precautions/restrictions along with deficits noted below. Pt continues to demonstrate good rehab potential and would benefit from continued skilled OT to increase safety and independence with ADLs and functional transfers to allow pt to return home safely and reduce caregiver burden and fall risk. Continuing with SNF recommendations despite pt's excellent progress today to watch for consistency with progress. Will update as appropriate.     Recommendations for follow up therapy are one component of a multi-disciplinary discharge planning process, led by the attending  physician.  Recommendations may be updated based on patient status, additional functional criteria and insurance authorization.    Follow Up Recommendations  Skilled nursing-short term rehab (<3 hours/day)    Assistance Recommended at Discharge Frequent or constant Supervision/Assistance  Patient can return home with the following  Direct supervision/assist for medications management;Help with stairs or ramp for entrance;Assist for transportation;Direct supervision/assist for financial management;Assistance with cooking/housework;A little help with walking and/or transfers;A little help with bathing/dressing/bathroom   Equipment Recommendations   (May need toileting aid and long handled equipment if progresses to home rather than SNF)    Recommendations for Other Services      Precautions / Restrictions Precautions Precautions: Back;Fall Precaution Comments: Improved recall of spinal precautions. Familiar with log roll and "BLT" Able to identify each one correctly,l but needs cues during ADLs and functional mobility to  follow. Required Braces or Orthoses: Spinal Brace Spinal Brace: Thoracolumbosacral orthotic Restrictions Weight Bearing Restrictions: No LUE Weight Bearing: Weight bearing as tolerated Other Position/Activity Restrictions: per MD op note on 10/1. pt okay to be out of LUE sling as tolerated, WBAT       Mobility Bed Mobility Overal bed mobility: Needs Assistance Bed Mobility: Rolling, Sidelying to Sit Rolling: Min assist Sidelying to sit: Mod assist (increased time/effort, cues for sequencing and back precautions)            Transfers                         Balance Overall balance assessment: Needs assistance Sitting-balance support: Feet supported, Bilateral upper extremity supported Sitting balance-Leahy Scale: Fair Sitting balance - Comments: Guarded   Standing balance support: No upper extremity supported, During functional  activity Standing balance-Leahy Scale: Fair Standing balance comment: stood at sink with supervision for grooming tasks and used BUE for LE dressing in standing.                           ADL either performed or assessed with clinical judgement   ADL Overall ADL's : Needs assistance/impaired     Grooming: Standing;Wash/dry face;Oral care;Wash/dry hands;Supervision/safety;Cueing for compensatory techniques Grooming Details (indicate cue type and reason): Cues to bring kidney basin to mouth and washcloth up to face while standing tall to avoid forward flexion at trunk over sink. Cues to position RW at sink for safety.             Lower Body Dressing: Total assistance;Sitting/lateral leans;Min guard Lower Body Dressing Details (indicate cue type and reason): Total Assist to don socks at EOB. able to stand and release RW with BUEs to doff underwear over hips to prepare to sit on [facility] with Min guard for balance/safety. Toilet Transfer: Min guard;BSC/3in1;Ambulation;Rolling walker (2 wheels) Toilet Transfer Details (indicate cue type and reason): Pt stood from EOB with Min guard assist and used RW to ambulate to bathroom with BSC placed on top of commode. Pt descended to [facility] with Min guard assist. Cues for keeping trunk tall.   Toileting - Clothing Manipulation Details (indicate cue type and reason): Pt educated on hygiene methods with avoidance of twisting or bending. Pt able to reach with RUE around  back to demonstrate posterior hygiene, but unable to come fully to sacrum and therefore may need some level of assistance bs adaptive equipment options for hygiene.     Functional mobility during ADLs: Min guard      Extremity/Trunk Assessment Upper Extremity Assessment LUE Deficits / Details: Pt using BUEs functionally. Heavy push with LUE on bed rail to reach sitting and pt denied shoulder pain.       Cervical / Trunk Assessment Cervical / Trunk Assessment: Back  Surgery Cervical / Trunk Exceptions: back pain, recent back surgery    Vision   Vision Assessment?: No apparent visual deficits   Perception     Praxis      Cognition Arousal/Alertness: Awake/alert Behavior During Therapy: WFL for tasks assessed/performed, Restless Overall Cognitive Status: Within Functional Limits for tasks assessed                                          Exercises      Shoulder Instructions       General Comments      Pertinent Vitals/ Pain       Pain Assessment Pain Assessment: 0-10 Pain Score: 7  Pain Location: Back Pain Descriptors / Indicators: Guarding, Shooting, Moaning, Other (Comment) (Shouting in pain during bed mobility. Significantly less pain with standing activities.) Pain Intervention(s): Limited activity within patient's tolerance, Monitored during session, Premedicated before session, Repositioned, Relaxation, Other (comment) (TLSO donned once EOB.)  Home Living                                          Prior Functioning/Environment              Frequency  Min 2X/week        Progress Toward Goals  OT Goals(current goals can now  be found in the care plan section)  Progress towards OT goals: Progressing toward goals  Acute Rehab OT Goals Patient Stated Goal: Walk OT Goal Formulation: With patient Time For Goal Achievement: 03/22/22 Potential to Achieve Goals: Good  Plan Discharge plan remains appropriate    Co-evaluation                 AM-PAC OT "6 Clicks" Daily Activity     Outcome Measure   Help from another person eating meals?: None Help from another person taking care of personal grooming?: A Little Help from another person toileting, which includes using toliet, bedpan, or urinal?: A Lot Help from another person bathing (including washing, rinsing, drying)?: A Lot Help from another person to put on and taking off regular upper body clothing?: A Lot (for  TLSO) Help from another person to put on and taking off regular lower body clothing?: A Lot 6 Click Score: 15    End of Session Equipment Utilized During Treatment: Back brace;Rolling walker (2 wheels)  OT Visit Diagnosis: Unsteadiness on feet (R26.81);Muscle weakness (generalized) (M62.81);History of falling (Z91.81);Pain Pain - part of body:  (back)   Activity Tolerance Patient tolerated treatment well;Patient limited by pain   Patient Left Other (comment);with call bell/phone within reach (On [name])   Nurse Communication Other (comment);Mobility status (Rn in room and accepted OT in to assist. CNA in room as pt up in room.)        Time: 1204-1229 OT Time Calculation (min): 25 min  Charges: OT General Charges $OT Visit: 1 Visit OT Treatments $Self Care/Home Management : 8-22 mins $Therapeutic Activity: 8-22 mins  Anderson Malta, OT Acute Rehab Services Office: [phone] 03/11/2022  Julien Girt 03/11/2022, 12:54 PM

## 2022-03-11 NOTE — Progress Notes (Signed)
Palliative Medicine Progress Note   Patient Name: [name]       Date: 03/11/2022 DOB: 06-27-1970  Age: 51 y.o. MRN#: [id number] Attending Physician: Kathie Dike, MD Primary Care Physician: [name], MD Admit Date: 03/07/2022  Reason for Consultation/Follow-up: Symptom management  HPI/Patient Profile: 51 y.o. male  with past medical history of  IgG kappa multiple myeloma on treatment with Velcade/Revlimid, recent admission after fall with pathologic compression fractures s/p ablation and kyphoplasty and fracture of humerus. Plan is to start zometa but on hold due to tooth extraction on 10/6. Now admitted on 03/07/2022 with worsening back pain.  MRI did not show any current acute changes. Palliative medicine consulted for assistance with back pain related to cancer.     Subjective: Chart reviewed and patient assessed at bedside.  He is alert and currently sitting on the [facility]; hopeful to have a BM.  He reports his pain is 6/10.  We discussed trying to manage his pain so that it is at a level lower than 6.  Discussed option to increase gabapentin or change oxycodone back to every 4 hours.  Also discussed the need to balance adequate pain relief with potential side effects such as drowsiness and constipation.  [name] wishes to continue current regimen at this time and reassess tomorrow.  Objective:  Physical Exam Vitals reviewed.  Constitutional:      General: He is not in acute distress. Pulmonary:     Effort: Pulmonary effort is normal.  Neurological:     Mental Status: He is alert and oriented to person, place, and time.  Psychiatric:        Behavior: Behavior normal.             Vital Signs: BP 114/60   Pulse 67   Temp 98.3 F (36.8 C) (Oral)   Resp 18   Ht _0   (1.753 m)   Wt 111.6 kg   SpO2 97%   BMI 36.33 kg/m  SpO2: SpO2: 97 % O2 Device: O2 Device: Room Air O2 Flow Rate:      LBM: Last BM Date : 03/07/22     Palliative Medicine Assessment & Plan   Assessment: Principal Problem:   Intractable low back pain Active Problems:   Essential hypertension   Multiple myeloma not having achieved remission (HCC)  Traumatic compression fracture of lumbar vertebra (HCC)   Normocytic anemia   Back pain   AKI (acute kidney injury) ([place])    Recommendations/Plan: Continue dexamethasone 4 mg x 2 days (today and tomorrow) and continue Protonix for GI prophylaxis Continue MS Contin 60 mg every 8 hours Continue oxycodone IR 15 mg every 6 hours Continue senna 2 tablets twice daily Continue MiraLAX 17 g twice daily Continue tizanidine 4 mg 3 times daily PMT will continue to follow   Code Status: Full   Prognosis:  Unable to determine  Discharge Planning: To Be Determined    Thank you for allowing the Palliative Medicine Team to assist in the care of this patient.   MDM - moderate   Lavena Bullion, NP   Please contact Palliative Medicine Team phone at [phone] for questions and concerns.  For individual providers, please see AMION.

## 2022-03-11 NOTE — NC FL2 (Signed)
[place] LEVEL OF CARE SCREENING TOOL     IDENTIFICATION  Patient Name: [name] Birthdate: 17-Jan-1971 Sex: male Admission Date (Current Location): 03/07/2022  [facility] and [place] Number:  Herbalist and Address:  [facility],  [place], [place]      Provider Number: [id number]  Attending Physician Name and Address:  Kathie Dike, MD  Relative Name and Phone Number:       Current Level of Care: Hospital Recommended Level of Care: [place] Prior Approval Number:    Date Approved/Denied:   PASRR Number: [id number]  Discharge Plan: SNF    Current Diagnoses: Patient Active Problem List   Diagnosis Date Noted   AKI (acute kidney injury) ([place]) 03/11/2022   Back pain 03/08/2022   Intractable low back pain 03/07/2022   Pathologic fracture 02/17/2022   Traumatic compression fracture of lumbar vertebra ([place]) 02/17/2022   Normocytic anemia 02/17/2022   Hyponatremia 02/17/2022   Fall at home, initial encounter 02/17/2022   Obesity (BMI 30-39.9) 02/17/2022   Multiple myeloma ([place]) 02/16/2022   Multiple myeloma not having achieved remission ([place]) 02/16/2022   Hyperlipidemia 02/17/2019   Lesion of liver 09/23/2017   Atypical chest pain 05/01/2016   Abnormal ultrasound 07/19/2015   ALCOHOL ABUSE 09/06/2008   TOBACCO ABUSE 09/06/2008   Essential hypertension 09/06/2008   Chronic GERD 09/06/2008    Orientation RESPIRATION BLADDER Height & Weight     Self, Time, Situation, Place  Normal Continent Weight: 246 lb (111.6 kg) Height:  $Remove'5\' 9"'ZLNVPqi$  (175.3 cm)  BEHAVIORAL SYMPTOMS/MOOD NEUROLOGICAL BOWEL NUTRITION STATUS      Continent    AMBULATORY STATUS COMMUNICATION OF NEEDS Skin       Normal                       Personal Care Assistance Level of Assistance              Functional Limitations Info             SPECIAL CARE FACTORS FREQUENCY  PT (By licensed PT), OT (By licensed  OT)     PT Frequency: 5x/wk OT Frequency: 5x/wk            Contractures Contractures Info: Not present    Additional Factors Info  Allergies, Code Status Code Status Info: Full Allergies Info: NKDA           Current Medications (03/11/2022):  This is the current hospital active medication list Current Facility-Administered Medications  Medication Dose Route Frequency Provider Last Rate Last Admin   0.9 %  sodium chloride infusion   Intravenous PRN Kathie Dike, MD 10 mL/hr at 03/10/22 1722 New Bag at 03/10/22 1722   0.9 %  sodium chloride infusion   Intravenous Continuous Kathie Dike, MD 100 mL/hr at 03/11/22 1159 New Bag at 03/11/22 1159   acetaminophen (TYLENOL) tablet 1,000 mg  1,000 mg Oral Q6H PRN Zada Finders R, MD   1,000 mg at 03/09/22 1435   Or   acetaminophen (TYLENOL) suppository 650 mg  650 mg Rectal Q6H PRN Lenore Cordia, MD       allopurinol (ZYLOPRIM) tablet 300 mg  300 mg Oral Daily Kathie Dike, MD   300 mg at 03/11/22 0815   aspirin EC tablet 81 mg  81 mg Oral Daily Kathie Dike, MD   81 mg at 03/11/22 0815   dexamethasone (DECADRON) tablet 4 mg  4 mg  Oral Daily Earlie Counts, NP   4 mg at 03/11/22 1020   enoxaparin (LOVENOX) injection 40 mg  40 mg Subcutaneous Q24H Zada Finders R, MD   40 mg at 03/10/22 2150   gabapentin (NEURONTIN) capsule 100 mg  100 mg Oral QHS Earlie Counts, NP   100 mg at 03/10/22 2150   influenza vac split quadrivalent PF (FLUARIX) injection 0.5 mL  0.5 mL Intramuscular Tomorrow-1000 Zada Finders R, MD       methocarbamol (ROBAXIN) 500 mg in dextrose 5 % 50 mL IVPB  500 mg Intravenous Q6H PRN Zada Finders R, MD 110 mL/hr at 03/11/22 1453 500 mg at 03/11/22 1453   morphine (MS CONTIN) 12 hr tablet 60 mg  60 mg Oral Q8H Memon, Jolaine Artist, MD   60 mg at 03/11/22 1431   morphine (PF) 2 MG/ML injection 2 mg  2 mg Intravenous Q3H PRN Zada Finders R, MD   2 mg at 03/10/22 0545   ondansetron (ZOFRAN) tablet 4 mg  4 mg Oral  Q6H PRN Lenore Cordia, MD       Or   ondansetron (ZOFRAN) injection 4 mg  4 mg Intravenous Q6H PRN Zada Finders R, MD   4 mg at 03/10/22 1145   oxyCODONE (Oxy IR/ROXICODONE) immediate release tablet 15 mg  15 mg Oral Q6H Mahan, Kasie J, NP   15 mg at 03/11/22 1200   pantoprazole (PROTONIX) EC tablet 40 mg  40 mg Oral Daily Kathie Dike, MD   40 mg at 03/11/22 0815   polyethylene glycol (MIRALAX / GLYCOLAX) packet 17 g  17 g Oral BID Earlie Counts, NP   17 g at 03/11/22 1020   rosuvastatin (CRESTOR) tablet 10 mg  10 mg Oral Daily Kathie Dike, MD   10 mg at 03/11/22 0815   senna-docusate (Senokot-S) tablet 2 tablet  2 tablet Oral BID Earlie Counts, NP   2 tablet at 03/11/22 0815   tiZANidine (ZANAFLEX) tablet 4 mg  4 mg Oral TID Earlie Counts, NP   4 mg at 03/11/22 1020     Discharge Medications: Please see discharge summary for a list of discharge medications.  Relevant Imaging Results:  Relevant Lab Results:   Additional Information SS# [id number]  Lennart Pall, LCSW

## 2022-03-12 ENCOUNTER — Inpatient Hospital Stay: Payer: BC Managed Care – PPO

## 2022-03-12 ENCOUNTER — Inpatient Hospital Stay: Payer: BC Managed Care – PPO | Admitting: Hematology and Oncology

## 2022-03-12 ENCOUNTER — Inpatient Hospital Stay: Payer: BC Managed Care – PPO | Admitting: Nurse Practitioner

## 2022-03-12 ENCOUNTER — Telehealth: Payer: Self-pay | Admitting: *Deleted

## 2022-03-12 DIAGNOSIS — N179 Acute kidney failure, unspecified: Secondary | ICD-10-CM

## 2022-03-12 DIAGNOSIS — K219 Gastro-esophageal reflux disease without esophagitis: Secondary | ICD-10-CM | POA: Diagnosis not present

## 2022-03-12 DIAGNOSIS — I10 Essential (primary) hypertension: Secondary | ICD-10-CM | POA: Diagnosis not present

## 2022-03-12 DIAGNOSIS — E782 Mixed hyperlipidemia: Secondary | ICD-10-CM

## 2022-03-12 DIAGNOSIS — M5459 Other low back pain: Secondary | ICD-10-CM | POA: Diagnosis not present

## 2022-03-12 LAB — RENAL FUNCTION PANEL
Albumin: 2.9 g/dL — ABNORMAL LOW (ref 3.5–5.0)
Anion gap: 5 (ref 5–15)
BUN: 38 mg/dL — ABNORMAL HIGH (ref 6–20)
CO2: 23 mmol/L (ref 22–32)
Calcium: 9.9 mg/dL (ref 8.9–10.3)
Chloride: 105 mmol/L (ref 98–111)
Creatinine, Ser: 1.13 mg/dL (ref 0.61–1.24)
GFR, Estimated: >60 mL/min (ref >60)
Glucose, Bld: 99 mg/dL (ref 70–99)
Phosphorus: 3.4 mg/dL (ref 2.5–4.6)
Potassium: 4.6 mmol/L (ref 3.5–5.1)
Sodium: 133 mmol/L — ABNORMAL LOW (ref 135–145)

## 2022-03-12 LAB — CBC
HCT: 27.5 % — ABNORMAL LOW (ref 39.0–52.0)
Hemoglobin: 8.7 g/dL — ABNORMAL LOW (ref 13.0–17.0)
MCH: 29.5 pg (ref 26.0–34.0)
MCHC: 31.6 g/dL (ref 30.0–36.0)
MCV: 93.2 fL (ref 80.0–100.0)
Platelets: 145 10*3/uL — ABNORMAL LOW (ref 150–400)
RBC: 2.95 MIL/uL — ABNORMAL LOW (ref 4.22–5.81)
RDW: 13.2 % (ref 11.5–15.5)
WBC: 4.9 10*3/uL (ref 4.0–10.5)
nRBC: 0 % (ref 0.0–0.2)

## 2022-03-12 LAB — GLUCOSE, CAPILLARY: Glucose-Capillary: 113 mg/dL — ABNORMAL HIGH (ref 70–99)

## 2022-03-12 MED ORDER — INSULIN ASPART 100 UNIT/ML IJ SOLN
0.0000 [IU] | Freq: Three times a day (TID) | INTRAMUSCULAR | Status: DC
  Administered 2022-03-15: 2 [IU] via SUBCUTANEOUS
  Administered 2022-03-15: 1 [IU] via SUBCUTANEOUS

## 2022-03-12 MED ORDER — HYDRALAZINE HCL 20 MG/ML IJ SOLN: 10.0000 mg | Freq: Four times a day (QID) | INTRAMUSCULAR | Status: DC | PRN

## 2022-03-12 MED ORDER — OXYCODONE HCL 5 MG PO TABS
20.0000 mg | ORAL_TABLET | Freq: Four times a day (QID) | ORAL | Status: DC
  Administered 2022-03-12 – 2022-03-17 (×20): 20 mg via ORAL
  Filled 2022-03-12 (×20): qty 4

## 2022-03-12 MED ORDER — LENALIDOMIDE 25 MG PO CAPS
25.0000 mg | ORAL_CAPSULE | Freq: Every day | ORAL | Status: DC
  Administered 2022-03-12 – 2022-03-14 (×3): 25 mg via ORAL

## 2022-03-12 NOTE — Telephone Encounter (Signed)
Received call from pt. He is asking about his treatment for Multiple myeloma. Advised that as he is still in the hospital, we cannot give him his chemo. Advised that once he is discharged, we can resume. He is unclear as to his discharge date as he continues to struggle with pain control. He does not feel that he can go home in his current condition. He may be going to a SNF. Advised that I would let Dr. Lorenso Courier know that he has questions regarding his treatment.

## 2022-03-12 NOTE — Assessment & Plan Note (Signed)
·   Please see assessment and plan above °

## 2022-03-12 NOTE — Progress Notes (Signed)
Physical Therapy Treatment Patient Details Name: [name] MRN: [id number] DOB: Jun 09, 1970 Today's Date: 03/12/2022   History of Present Illness [name] is a 51 y.o. male with medical history significant for IgG kappa multiple myeloma on active treatment with Velcade/Revlimid, pathologic left humeral fracture (s/p IM nail 02/22/2022) and L1, L2, L3 compression fractures s/p radiofrequency ablation and kyphoplasty (L1, L2 on 02/19/2022 and L3 on 02/05/2022), HTN, anemia of chronic disease who presented to the ED for evaluation of worsening lower back pain.  Patient was recently hospitalized 02/17/2022-02/24/2022 for pathologic proximal left humeral fracture and lumbar L1-2 compression fractures.  He underwent IR kyphoplasty on 9/27 in the left humeral shaft medullary nailing 9/30.    PT Comments    Pt requiring encouragement and increased time/effort with transfers due to pain.  He reports he was up earlier walking with nursing and doesn't want to overdo.  He did demonstrate good progress with PT today -requiring min A for transfers and took a few steps at bedside. Pt with heavy reliance on bed functions for transfers.  Continue to recommend SNF at d/c due to level of assist, effort / time required with transfers, and pain control would be unable to manage at home.   Recommendations for follow up therapy are one component of a multi-disciplinary discharge planning process, led by the attending physician.  Recommendations may be updated based on patient status, additional functional criteria and insurance authorization.  Follow Up Recommendations  Skilled nursing-short term rehab (<3 hours/day) Can patient physically be transported by private vehicle: Yes   Assistance Recommended at Discharge Frequent or constant Supervision/Assistance  Patient can return home with the following A little help with walking and/or transfers;A little help with bathing/dressing/bathroom;Assistance with  cooking/housework;Help with stairs or ramp for entrance   Equipment Recommendations  Rolling walker (2 wheels)    Recommendations for Other Services       Precautions / Restrictions Precautions Precautions: Back;Fall Precaution Booklet Issued: Yes (comment) Spinal Brace: Thoracolumbosacral orthotic;Applied in sitting position Restrictions LUE Weight Bearing: Weight bearing as tolerated Other Position/Activity Restrictions: per MD op note on 10/1. pt okay to be out of LUE sling as tolerated, WBAT     Mobility  Bed Mobility Overal bed mobility: Needs Assistance Bed Mobility: Rolling, Sidelying to Sit Rolling: Min assist Sidelying to sit: Min assist, HOB elevated       General bed mobility comments: Increased time and effort due to pain. Relied heavily on bed rails and ability to elevate HOB.  Good log roll technique with assit for trunk to sit and legs back to bed    Transfers Overall transfer level: Needs assistance Equipment used: Rolling walker (2 wheels) Transfers: Sit to/from Stand Sit to Stand: Min guard           General transfer comment: Increased time and effort from elevated bed with min guard    Ambulation/Gait Ambulation/Gait assistance: Min guard Gait Distance (Feet): 3 Feet Assistive device: Rolling walker (2 wheels) Gait Pattern/deviations: Step-to pattern, Decreased stride length Gait velocity: decreased     General Gait Details: side steps at EOB only; reports pain limiting; states he walked in room earlier with nursing and doesn't want to overdo it now   Stairs             Wheelchair Mobility    Modified Rankin (Stroke Patients Only)       Balance Overall balance assessment: Needs assistance Sitting-balance support: Feet supported, Bilateral upper extremity supported Sitting balance-Leahy Scale:  Fair Sitting balance - Comments: UE support for pain control   Standing balance support: Bilateral upper extremity  supported Standing balance-Leahy Scale: Fair Standing balance comment: UE support/RW for pain control                            Cognition Arousal/Alertness: Awake/alert Behavior During Therapy: WFL for tasks assessed/performed Overall Cognitive Status: Within Functional Limits for tasks assessed                                 General Comments: Pt frequently reports how severe pain is and he doesn't want to overdo        Exercises      General Comments        Pertinent Vitals/Pain Pain Assessment Pain Assessment: 0-10 Pain Score: 10-Worst pain ever Pain Location: Back Pain Descriptors / Indicators: Sharp, Shooting, Grimacing (Pt yelling at times in pain but then able to stop and converse) Pain Intervention(s): Limited activity within patient's tolerance, Monitored during session, Premedicated before session    Home Living                          Prior Function            PT Goals (current goals can now be found in the care plan section) Progress towards PT goals: Progressing toward goals    Frequency    Min 3X/week      PT Plan Current plan remains appropriate    Co-evaluation              AM-PAC PT "6 Clicks" Mobility   Outcome Measure  Help needed turning from your back to your side while in a flat bed without using bedrails?: None Help needed moving from lying on your back to sitting on the side of a flat bed without using bedrails?: A Little Help needed moving to and from a bed to a chair (including a wheelchair)?: A Little Help needed standing up from a chair using your arms (e.g., wheelchair or bedside chair)?: A Little Help needed to walk in hospital room?: Total Help needed climbing 3-5 steps with a railing? : Total 6 Click Score: 15    End of Session Equipment Utilized During Treatment: Back brace Activity Tolerance: Patient limited by pain Patient left: in bed;with call bell/phone within reach;with  bed alarm set Nurse Communication: Mobility status PT Visit Diagnosis: Other abnormalities of gait and mobility (R26.89);Pain     Time: 4680-3212 PT Time Calculation (min) (ACUTE ONLY): 20 min  Charges:  $Therapeutic Activity: 8-22 mins                     Abran Richard, PT Acute Rehab [facility] [phone]    [name] 03/12/2022, 12:46 PM

## 2022-03-12 NOTE — Progress Notes (Signed)
Palliative Medicine Progress Note   Patient Name: [name]       Date: 03/12/2022 DOB: 1971/04/04  Age: 51 y.o. MRN#: [id number] Attending Physician: Vernelle Emerald, MD Primary Care Physician: [name], MD Admit Date: 03/07/2022  Reason for Consultation/Follow-up: Symptom management  HPI/Patient Profile: 51 y.o. male  with past medical history of  IgG kappa multiple myeloma on treatment with Velcade/Revlimid, recent admission after fall with pathologic compression fractures s/p ablation and kyphoplasty and fracture of humerus. Plan is to start zometa but on hold due to tooth extraction on 10/6. Now admitted on 03/07/2022 with worsening back pain.  MRI did not show any current acute changes. Palliative medicine consulted for assistance with back pain related to cancer.     Subjective: Chart reviewed and patient assessed at bedside.  He is alert and currently resting in bed, participated with physical therapy to the best of his ability, however, he reports his pain is 6/10.  He still feels episodic incident pain in his mid to lower back exacerbated by movement.    Objective:  Physical Exam Vitals reviewed.  Constitutional:      General: He is not in acute distress. Pulmonary:     Effort: Pulmonary effort is normal.  Neurological:     Mental Status: He is alert and oriented to person, place, and time.  Psychiatric:        Behavior: Behavior normal.             Vital Signs: BP 118/66 (BP Location: Right Arm)   Pulse 70   Temp 98.6 F (37 C) (Oral)   Resp 14   Ht _0  (1.753 m)   Wt 111.6 kg   SpO2 99%   BMI 36.33 kg/m  SpO2: SpO2: 99 % O2 Device: O2 Device: Room Air O2 Flow Rate:      LBM: Last BM Date : 03/11/22     Palliative Medicine Assessment &  Plan   Assessment: Principal Problem:   Intractable low back pain Active Problems:   Essential hypertension   Multiple myeloma not having achieved remission (HCC)   Traumatic compression fracture of lumbar vertebra (HCC)   Normocytic anemia   Back pain   AKI (acute kidney injury) ([place])    Recommendations/Plan: Continue dexamethasone 4 mg x 2 days (today and tomorrow)  and continue Protonix for GI prophylaxis Continue MS Contin 60 mg every 8 hours change oxycodone IR  to 20 mg every 6 hours Continue senna 2 tablets twice daily Continue MiraLAX 17 g twice daily Continue tizanidine 4 mg 3 times daily PMT will continue to follow   Code Status: Full   Prognosis:  Unable to determine  Discharge Planning: Skilled nursing facility for rehab efforts with the addition of palliative services is recommended.    Thank you for allowing the Palliative Medicine Team to assist in the care of this patient.   MDM - moderate   Loistine Chance, MD   Please contact Palliative Medicine Team phone at [phone] for questions and concerns.  For individual providers, please see AMION.

## 2022-03-12 NOTE — Assessment & Plan Note (Signed)
.   Continuing home regimen of lipid lowering therapy.  

## 2022-03-12 NOTE — Assessment & Plan Note (Signed)
Continuing home regimen of daily PPI therapy.  

## 2022-03-12 NOTE — Progress Notes (Signed)
Progress Note   Patient: [name] JZP:[id number] DOB: Jul 17, 1970 DOA: 03/07/2022     4 DOS: the patient was seen and examined on 03/12/2022   Brief hospital course: [name] is a 51 y.o. male with medical history significant for IgG kappa multiple myeloma on active treatment with Velcade/Revlimid, pathologic left humeral fracture (s/p IM nail 02/22/2022) and L1, L2, L3 compression fractures s/p radiofrequency ablation and kyphoplasty (L1, L2 on 02/19/2022 and L3 on 02/05/2022), HTN, anemia of chronic disease who is admitted with intractable lower back pain.  Assessment and Plan: * Intractable low back pain L1, L2, L3 vertebral compression fractures s/p recent kyphoplasty Patient continuing to experience intractable lower back pain on arrival Pain does seem to be improving somewhat, albeit very slowly Opiate and muscle relaxant regimen actively being titrated by palliative care, their input is appreciated Continuing short course of dexamethasone as well per their recommendations Continued attempts daily for patient to work with PT, OT Plan is for patient to eventually go to skilled nursing facility once bed is available  Traumatic compression fracture of lumbar vertebra ([place]) Please see assessment and plan above  Multiple myeloma not having achieved remission [name]) Case discussed with Dr. Lorenso Courier with oncology who states that it is okay for patient to continue Revlimid but hospitalized  Dr. Lorenso Courier recommends outpatient follow-up with him for evaluation as to when to resume Velcade.  AKI (acute kidney injury) ([place]) Improved with intravenous volume resuscitation We will transition patient off of intravenous fluids today Encourage oral intake Strict input and output monitoring Monitoring renal function and electrolytes with serial chemistries Avoiding nephrotoxic agents if at all possible   Essential hypertension Holding home regimen of oral hypoglycemics As needed  intravenous antihypertensives for markedly elevated blood pressure.  GERD without esophagitis Continuing home regimen of daily PPI therapy.   Mixed hyperlipidemia Continuing home regimen of lipid lowering therapy.   Normocytic anemia-resolved as of 03/12/2022 In setting of multiple myeloma.  Hemoglobin slightly decreased from baseline at 9.0.  No obvious bleeding.  Continue to monitor.        Subjective:   Patient continues to complain of low back pain, 6 out of 10, sharp in quality, radiating into the hips, worse with movement.  Patient reports this pain is somewhat improved compared to yesterday.  Physical Exam: Vitals:   03/11/22 1135 03/11/22 2222 03/12/22 0518 03/12/22 1340  BP: 114/60 127/60 107/62 118/66  Pulse: 67 77 62 70  Resp:  $Remo'17 17 14  'QrJmO$ Temp:  98.1 F (36.7 C) 97.8 F (36.6 C) 98.6 F (37 C)  TempSrc:  Oral Oral Oral  SpO2:  97% 97% 99%  Weight:      Height:       Constitutional: Awake alert and oriented x3, in mild distress due to pain. Respiratory: clear to auscultation bilaterally, no wheezing, no crackles. Normal respiratory effort. No accessory muscle use.  Cardiovascular: Regular rate and rhythm, no murmurs / rubs / gallops. No extremity edema. 2+ pedal pulses. No carotid bruits.  Abdomen: Abdomen is soft and nontender.  No evidence of intra-abdominal masses.  Positive bowel sounds noted in all quadrants.   Back: Notable midline and paraspinal lumbar tenderness without crepitus or deformity. Musculoskeletal: No joint deformity upper and lower extremities. Good ROM, no contractures. Normal muscle tone.        Data Reviewed:   CBC revealing white blood cell count of 4.9, hemoglobin 8.7, hematocrit 27.5, platelet count of 145.  Family Communication: Deferred  Disposition:  Status is: Inpatient Remains inpatient appropriate because: Continued intractable back pain requiring continued dosing of opiate-based analgesics, frequent physical therapy  assessments and close conical monitoring.   Planned Discharge Destination: Skilled nursing facility    Time spent: 51 minutes  Author: Vernelle Emerald, MD 03/12/2022 7:11 PM  For on call review [email or web address].

## 2022-03-12 NOTE — TOC Progression Note (Signed)
Transition of Care [facility]) - Progression Note    Patient Details  Name: [name] MRN: [id number] Date of Birth: 1970-10-08  [facility] CM/SW Contact  Lennart Pall, LCSW Phone Number: 03/12/2022, 2:16 PM  Clinical Narrative:     Have reviewed SNF bed offers with pt and sister and pt has accepted bed at [facility].  Facility to start insurance authorization and keep CSW updated.    Expected Discharge Plan: [place] Barriers to Discharge: Continued Medical Work up  Expected Discharge Plan and Services Expected Discharge Plan: [place] In-house Referral: Clinical Social Work     Living arrangements for the past 2 months: Single Family Home                                       Social Determinants of Health (SDOH) Interventions    Readmission Risk Interventions    02/19/2022    3:30 PM  Readmission Risk Prevention Plan  Post Dischage Appt Complete  Medication Screening Complete  Transportation Screening Complete

## 2022-03-12 NOTE — Plan of Care (Signed)
  Problem: Pain Managment: Goal: General experience of comfort will improve Outcome: Progressing   Problem: Safety: Goal: Ability to remain free from injury will improve Outcome: Progressing   

## 2022-03-12 NOTE — Assessment & Plan Note (Addendum)
   Improved with intravenous volume resuscitation  Intravenous fluids have since been discontinued  Encourage oral intake  Strict input and output monitoring  Monitoring renal function and electrolytes with serial chemistries  Avoiding nephrotoxic agents if at all possible

## 2022-03-13 ENCOUNTER — Other Ambulatory Visit: Payer: Self-pay

## 2022-03-13 DIAGNOSIS — I10 Essential (primary) hypertension: Secondary | ICD-10-CM | POA: Diagnosis not present

## 2022-03-13 DIAGNOSIS — M5459 Other low back pain: Secondary | ICD-10-CM | POA: Diagnosis not present

## 2022-03-13 DIAGNOSIS — D649 Anemia, unspecified: Secondary | ICD-10-CM

## 2022-03-13 DIAGNOSIS — M545 Low back pain, unspecified: Secondary | ICD-10-CM | POA: Diagnosis not present

## 2022-03-13 DIAGNOSIS — N179 Acute kidney failure, unspecified: Secondary | ICD-10-CM | POA: Diagnosis not present

## 2022-03-13 DIAGNOSIS — C9000 Multiple myeloma not having achieved remission: Secondary | ICD-10-CM | POA: Diagnosis not present

## 2022-03-13 DIAGNOSIS — Z515 Encounter for palliative care: Secondary | ICD-10-CM | POA: Diagnosis not present

## 2022-03-13 DIAGNOSIS — K5903 Drug induced constipation: Secondary | ICD-10-CM | POA: Diagnosis not present

## 2022-03-13 LAB — COMPREHENSIVE METABOLIC PANEL
ALT: 66 U/L — ABNORMAL HIGH (ref 0–44)
AST: 50 U/L — ABNORMAL HIGH (ref 15–41)
Albumin: 2.8 g/dL — ABNORMAL LOW (ref 3.5–5.0)
Alkaline Phosphatase: 60 U/L (ref 38–126)
Anion gap: 6 (ref 5–15)
BUN: 24 mg/dL — ABNORMAL HIGH (ref 6–20)
CO2: 26 mmol/L (ref 22–32)
Calcium: 9.4 mg/dL (ref 8.9–10.3)
Chloride: 100 mmol/L (ref 98–111)
Creatinine, Ser: 0.98 mg/dL (ref 0.61–1.24)
GFR, Estimated: >60 mL/min (ref >60)
Glucose, Bld: 104 mg/dL — ABNORMAL HIGH (ref 70–99)
Potassium: 4.1 mmol/L (ref 3.5–5.1)
Sodium: 132 mmol/L — ABNORMAL LOW (ref 135–145)
Total Bilirubin: 0.6 mg/dL (ref 0.3–1.2)
Total Protein: 8.7 g/dL — ABNORMAL HIGH (ref 6.5–8.1)

## 2022-03-13 LAB — CBC WITH DIFFERENTIAL/PLATELET
Abs Immature Granulocytes: 0.01 10*3/uL (ref 0.00–0.07)
Basophils Absolute: 0 10*3/uL (ref 0.0–0.1)
Basophils Relative: 0 %
Eosinophils Absolute: 0.2 10*3/uL (ref 0.0–0.5)
Eosinophils Relative: 5 %
HCT: 27.9 % — ABNORMAL LOW (ref 39.0–52.0)
Hemoglobin: 8.9 g/dL — ABNORMAL LOW (ref 13.0–17.0)
Immature Granulocytes: 0 %
Lymphocytes Relative: 31 %
Lymphs Abs: 1.1 10*3/uL (ref 0.7–4.0)
MCH: 29.6 pg (ref 26.0–34.0)
MCHC: 31.9 g/dL (ref 30.0–36.0)
MCV: 92.7 fL (ref 80.0–100.0)
Monocytes Absolute: 0.5 10*3/uL (ref 0.1–1.0)
Monocytes Relative: 14 %
Neutro Abs: 1.9 10*3/uL (ref 1.7–7.7)
Neutrophils Relative %: 50 %
Platelets: 152 10*3/uL (ref 150–400)
RBC: 3.01 MIL/uL — ABNORMAL LOW (ref 4.22–5.81)
RDW: 13.1 % (ref 11.5–15.5)
WBC: 3.7 10*3/uL — ABNORMAL LOW (ref 4.0–10.5)
nRBC: 0 % (ref 0.0–0.2)

## 2022-03-13 LAB — GLUCOSE, CAPILLARY
Glucose-Capillary: 86 mg/dL (ref 70–99)
Glucose-Capillary: 108 mg/dL — ABNORMAL HIGH (ref 70–99)
Glucose-Capillary: 112 mg/dL — ABNORMAL HIGH (ref 70–99)
Glucose-Capillary: 143 mg/dL — ABNORMAL HIGH (ref 70–99)

## 2022-03-13 LAB — MAGNESIUM: Magnesium: 1.8 mg/dL (ref 1.7–2.4)

## 2022-03-13 MED ORDER — DEXAMETHASONE 4 MG PO TABS
4.0000 mg | ORAL_TABLET | Freq: Two times a day (BID) | ORAL | Status: AC
  Administered 2022-03-13 – 2022-03-16 (×8): 4 mg via ORAL
  Filled 2022-03-13 (×8): qty 1

## 2022-03-13 MED ORDER — DEXAMETHASONE 4 MG PO TABS: 4.0000 mg | ORAL_TABLET | Freq: Two times a day (BID) | ORAL | Status: DC

## 2022-03-13 NOTE — Progress Notes (Signed)
PROGRESS NOTE   [name]  LNL:[id number] DOB: 08/15/1970 DOA: 03/07/2022 PCP: [name], MD   Date of Service: the patient was seen and examined on 03/13/2022  Brief Narrative:  [name] is a 51 y.o. male with medical history significant for IgG kappa multiple myeloma on active treatment with Velcade/Revlimid, pathologic left humeral fracture (s/p IM nail 02/22/2022) and L1, L2, L3 compression fractures s/p radiofrequency ablation and kyphoplasty (L1, L2 on 02/19/2022 and L3 on 02/05/2022), HTN, anemia of chronic disease who is admitted with intractable lower back pain.   Assessment & Plan: Assessment and Plan: * Intractable low back pain L1, L2, L3 vertebral compression fractures s/p recent kyphoplasty Slow improvement of substantial low back pain Opiate and muscle relaxant regimen actively being titrated by palliative care, their input is appreciated Continuing short course of dexamethasone as well per their recommendations Continued attempts daily for patient to work with PT, OT Plan is for patient to eventually go to skilled nursing facility once bed is available  Traumatic compression fracture of lumbar vertebra ([place]) Please see assessment and plan above  Multiple myeloma not having achieved remission [facility]) Case discussed with Dr. Lorenso Courier with oncology who states that it is okay for patient to continue Revlimid while hospitalized  Dr. Lorenso Courier recommends outpatient follow-up with him for evaluation as to when to resume Velcade.  AKI (acute kidney injury) ([place]) Improved with intravenous volume resuscitation We will transition patient off of intravenous fluids today Encourage oral intake Strict input and output monitoring Monitoring renal function and electrolytes with serial chemistries Avoiding nephrotoxic agents if at all possible   Essential hypertension Holding home regimen of oral hypoglycemics As needed intravenous antihypertensives for markedly  elevated blood pressure.  GERD without esophagitis Continuing home regimen of daily PPI therapy.   Mixed hyperlipidemia Continuing home regimen of lipid lowering therapy.   Normocytic anemia-resolved as of 03/12/2022 In setting of multiple myeloma.  Hemoglobin slightly decreased from baseline at 9.0.  No obvious bleeding.  Continue to monitor.       Physical Exam:  Vitals:   03/12/22 1340 03/12/22 2150 03/13/22 0717 03/13/22 1331  BP: 118/66 (!) 147/78 125/80 132/74  Pulse: 70 69 65 81  Resp: $Remo'14 17 17 17  'UPGOq$ Temp: 98.6 F (37 C) 98.9 F (37.2 C) 98.3 F (36.8 C) 98.6 F (37 C)  TempSrc: Oral Oral  Oral  SpO2: 99% 100% 99% 98%  Weight:      Height:        Constitutional: Awake alert and oriented x3, no associated distress.   Skin: no rashes, no lesions, good skin turgor noted. Eyes: Pupils are equally reactive to light.  No evidence of scleral icterus or conjunctival pallor.  ENMT: Moist mucous membranes noted.  Posterior pharynx clear of any exudate or lesions.   Respiratory: clear to auscultation bilaterally, no wheezing, no crackles. Normal respiratory effort. No accessory muscle use.  Cardiovascular: Regular rate and rhythm, no murmurs / rubs / gallops. No extremity edema. 2+ pedal pulses. No carotid bruits.  Abdomen: Abdomen is soft and nontender.  No evidence of intra-abdominal masses.  Positive bowel sounds noted in all quadrants.   Musculoskeletal: No joint deformity upper and lower extremities. Good ROM, no contractures. Normal muscle tone.  Back: Continued tenderness of the lumbar region in the midline and paraspinal areas    Data Reviewed:  I have personally reviewed and interpreted labs, imaging.  Significant findings are   CBC: Recent Labs  Lab 03/07/22 1415 03/08/22  6244 03/11/22 0322 03/12/22 0329 03/13/22 0328  WBC 5.3 5.2 6.9 4.9 3.7*  NEUTROABS  --   --   --   --  1.9  HGB 9.0* 9.2* 9.1* 8.7* 8.9*  HCT 28.8* 29.4* 29.5* 27.5* 27.9*  MCV  92.9 92.5 93.1 93.2 92.7  PLT 228 207 166 145* 695   Basic Metabolic Panel: Recent Labs  Lab 03/07/22 1415 03/08/22 0318 03/11/22 0322 03/12/22 0329 03/13/22 0328  NA 135 134* 130* 133* 132*  K 3.9 4.0 5.1 4.6 4.1  CL 108 104 102 105 100  CO2 22 24 21* 23 26  GLUCOSE 87 89 136* 99 104*  BUN 26* 17 55* 38* 24*  CREATININE 1.18 1.01 2.01* 1.13 0.98  CALCIUM 9.2 9.4 10.4* 9.9 9.4  MG  --   --   --   --  1.8  PHOS  --   --   --  3.4  --    GFR: Estimated Creatinine Clearance: 108.6 mL/min (by C-G formula based on SCr of 0.98 mg/dL). Liver Function Tests: Recent Labs  Lab 03/12/22 0329 03/13/22 0328  AST  --  50*  ALT  --  66*  ALKPHOS  --  60  BILITOT  --  0.6  PROT  --  8.7*  ALBUMIN 2.9* 2.8*     Code Status:  Full code      Severity of Illness:  The appropriate patient status for this patient is INPATIENT. Inpatient status is judged to be reasonable and necessary in order to provide the required intensity of service to ensure the patient's safety. The patient's presenting symptoms, physical exam findings, and initial radiographic and laboratory data in the context of their chronic comorbidities is felt to place them at high risk for further clinical deterioration. Furthermore, it is not anticipated that the patient will be medically stable for discharge from the hospital within 2 midnights of admission.   * I certify that at the point of admission it is my clinical judgment that the patient will require inpatient hospital care spanning beyond 2 midnights from the point of admission due to high intensity of service, high risk for further deterioration and high frequency of surveillance required.*  Time spent:  30 min  Author:  Vernelle Emerald MD  03/13/2022 7:34 PM

## 2022-03-13 NOTE — Plan of Care (Signed)

## 2022-03-13 NOTE — Progress Notes (Addendum)
Daily Progress Note   Patient Name: [name]       Date: 03/13/2022 DOB: [birth year]  Age: 51 y.o. MRN#: [id number] Attending Physician: Vernelle Emerald, MD Primary Care Physician: [name], MD Admit Date: 03/07/2022  Reason for Consultation/Follow-up: Establishing goals of care  Patient Profile/HPI:   51 y.o. male  with past medical history of  IgG kappa multiple myeloma on treatment with Velcade/Revlimid, recent admission after fall with pathologic compression fractures s/p ablation and kyphoplasty and fracture of humerus. Plan is to start zometa but on hold due to tooth extraction on 10/6. Now admitted on 03/07/2022 with worsening back pain.  MRI did not show any current acute changes. Palliative medicine consulted for assistance with back pain related to cancer.     Subjective: Chart reviewed including labs, progress notes, medication use.  [name] is awake and alert. Had a good BM yesterday.  Able to get out of bed today briefly, but still needing to move slowly.  Reports pain around 5-6. His goal of pain control is to allow him to participate actively in therapy and improve his function so he can return home independently.  Feels robaxin helps.  We discussed another course of dexamethasone and he is in agreement.  We also discussed the emotional aspects of myeloma diagnosis, and chronic pain. He has ups and downs in his moods. This morning he was very motivated to get out of bed. He gets down when his pain won't allow him to move and function as he is used to. We discussed how this changes part of his identity and can be distressing. He has support from his sister, he is able to speak freely to her.  I encouraged him to continue to communicate with his providers. We discussed  that an antidepressant may be helpful if his mood continues to be down and interferes with his daily life. Cymbalta would be a good choice due to its antidepressant and neuropathic effects.   Review of Systems  Musculoskeletal:  Positive for back pain.     Physical Exam Vitals and nursing note reviewed.  Constitutional:      Appearance: He is not ill-appearing.  Pulmonary:     Effort: Pulmonary effort is normal.  Neurological:     Mental Status: He is alert and oriented to person, place,  and time.             Vital Signs: BP 125/80 (BP Location: Right Arm)   Pulse 65   Temp 98.3 F (36.8 C)   Resp 17   Ht _0  (1.753 m)   Wt 111.6 kg   SpO2 99%   BMI 36.33 kg/m  SpO2: SpO2: 99 % O2 Device: O2 Device: Room Air O2 Flow Rate:    Intake/output summary:  Intake/Output Summary (Last 24 hours) at 03/13/2022 1058 Last data filed at 03/13/2022 0177 Gross per 24 hour  Intake 799.66 ml  Output 2150 ml  Net -1350.34 ml   LBM: Last BM Date : 03/11/22 Baseline Weight: Weight: 111.6 kg Most recent weight: Weight: 111.6 kg       Palliative Assessment/Data: PPS: 60%      Patient Active Problem List   Diagnosis Date Noted   AKI (acute kidney injury) ([place]) 03/11/2022   Intractable low back pain 03/07/2022   Pathologic fracture 02/17/2022   Traumatic compression fracture of lumbar vertebra ([place]) 02/17/2022   Hyponatremia 02/17/2022   Fall at home, initial encounter 02/17/2022   Obesity (BMI 30-39.9) 02/17/2022   Multiple myeloma ([place]) 02/16/2022   Multiple myeloma not having achieved remission ([place]) 02/16/2022   Mixed hyperlipidemia 02/17/2019   Lesion of liver 09/23/2017   GERD (gastroesophageal reflux disease) 09/23/2017   Atypical chest pain 05/01/2016   Abnormal ultrasound 07/19/2015   ALCOHOL ABUSE 09/06/2008   TOBACCO ABUSE 09/06/2008   Essential hypertension 09/06/2008   GERD without esophagitis 09/06/2008    Palliative Care Assessment & Plan     Assessment/Recommendations/Plan  Dexamethasone 4 mg BID x 4 days, continue Protonix for GI prophylaxis- consulted pharmacy and Oncology- ok to take with Revlimid- pt needs to be discharged with 28m aspirin- attending notified Continue MS Contin 60 mg every 8 hours Continue Oxycodone IR  to 20 mg every 6 hours Continue senna 2 tablets twice daily D/C Miralax Continue tizanidine 4 mg 3 times daily PMT will continue to follow - [facility]  Code Status: Full code  Prognosis:  Unable to determine  [place] [facility] [place] [facility] [place]  Care plan was discussed with patient and care team  Thank you for allowing the Palliative Medicine Team to assist in the care of this patient.   Greater than 50%  of this time was spent counseling and coordinating care related to the above assessment and plan.  KMariana Kaufman AGNP-C Palliative Medicine   Please contact Palliative Medicine Team phone at [phone] questions and concerns.

## 2022-03-13 NOTE — Plan of Care (Signed)
  Problem: Health Behavior/Discharge Planning: Goal: Ability to manage health-related needs will improve Outcome: Progressing   Problem: Activity: Goal: Risk for activity intolerance will decrease Outcome: Progressing   Problem: Nutrition: Goal: Adequate nutrition will be maintained Outcome: Progressing   Problem: Coping: Goal: Level of anxiety will decrease Outcome: Progressing   

## 2022-03-14 DIAGNOSIS — N179 Acute kidney failure, unspecified: Secondary | ICD-10-CM | POA: Diagnosis not present

## 2022-03-14 DIAGNOSIS — I10 Essential (primary) hypertension: Secondary | ICD-10-CM | POA: Diagnosis not present

## 2022-03-14 DIAGNOSIS — C9000 Multiple myeloma not having achieved remission: Secondary | ICD-10-CM | POA: Diagnosis not present

## 2022-03-14 DIAGNOSIS — M5459 Other low back pain: Secondary | ICD-10-CM | POA: Diagnosis not present

## 2022-03-14 DIAGNOSIS — Z515 Encounter for palliative care: Secondary | ICD-10-CM | POA: Diagnosis not present

## 2022-03-14 DIAGNOSIS — K5903 Drug induced constipation: Secondary | ICD-10-CM | POA: Diagnosis not present

## 2022-03-14 DIAGNOSIS — K219 Gastro-esophageal reflux disease without esophagitis: Secondary | ICD-10-CM | POA: Diagnosis not present

## 2022-03-14 LAB — COMPREHENSIVE METABOLIC PANEL
ALT: 78 U/L — ABNORMAL HIGH (ref 0–44)
AST: 36 U/L (ref 15–41)
Albumin: 3.2 g/dL — ABNORMAL LOW (ref 3.5–5.0)
Alkaline Phosphatase: 65 U/L (ref 38–126)
Anion gap: 6 (ref 5–15)
BUN: 24 mg/dL — ABNORMAL HIGH (ref 6–20)
CO2: 28 mmol/L (ref 22–32)
Calcium: 10 mg/dL (ref 8.9–10.3)
Chloride: 101 mmol/L (ref 98–111)
Creatinine, Ser: 1.03 mg/dL (ref 0.61–1.24)
GFR, Estimated: >60 mL/min (ref >60)
Glucose, Bld: 114 mg/dL — ABNORMAL HIGH (ref 70–99)
Potassium: 4.7 mmol/L (ref 3.5–5.1)
Sodium: 135 mmol/L (ref 135–145)
Total Bilirubin: 0.6 mg/dL (ref 0.3–1.2)
Total Protein: 9.6 g/dL — ABNORMAL HIGH (ref 6.5–8.1)

## 2022-03-14 LAB — GLUCOSE, CAPILLARY
Glucose-Capillary: 107 mg/dL — ABNORMAL HIGH (ref 70–99)
Glucose-Capillary: 113 mg/dL — ABNORMAL HIGH (ref 70–99)
Glucose-Capillary: 153 mg/dL — ABNORMAL HIGH (ref 70–99)
Glucose-Capillary: 110 mg/dL — ABNORMAL HIGH (ref 70–99)

## 2022-03-14 LAB — VITAMIN D 25 HYDROXY (VIT D DEFICIENCY, FRACTURES)

## 2022-03-14 MED ORDER — METHOCARBAMOL 500 MG PO TABS
1000.0000 mg | ORAL_TABLET | Freq: Three times a day (TID) | ORAL | Status: DC
  Administered 2022-03-14 – 2022-03-15 (×3): 1000 mg via ORAL
  Filled 2022-03-14 (×3): qty 2

## 2022-03-14 MED ORDER — ACYCLOVIR 400 MG PO TABS
400.0000 mg | ORAL_TABLET | Freq: Two times a day (BID) | ORAL | Status: DC
  Administered 2022-03-14 – 2022-03-17 (×7): 400 mg via ORAL
  Filled 2022-03-14 (×7): qty 1

## 2022-03-14 MED ORDER — METHOCARBAMOL 1000 MG/10ML IJ SOLN
500.0000 mg | Freq: Two times a day (BID) | INTRAVENOUS | Status: DC | PRN
  Filled 2022-03-14: qty 5

## 2022-03-14 NOTE — Progress Notes (Signed)
PROGRESS NOTE   [name]  LOV:[id number] DOB: 05-09-1971 DOA: 03/07/2022 PCP: [name], MD   Date of Service: the patient was seen and examined on 03/14/2022  Brief Narrative:  [name] is a 51 y.o. male with medical history significant for IgG kappa multiple myeloma on active treatment with Velcade/Revlimid, pathologic left humeral fracture (s/p IM nail 02/22/2022) and L1, L2, L3 compression fractures s/p radiofrequency ablation and kyphoplasty with L1-L2 on 02/19/2022 and L3 on 02/05/2022, HTN, anemia of chronic disease who is admitted with intractable lower back pain.  Patient remained in the hospital service for intractable pain with titration of opiate-based analgesics and muscle relaxants with the assistance of the palliative care service.  PT evaluation revealed the patient would likely benefit from repeat a skilled nursing facility.   Assessment and Plan: * Intractable low back pain L1, L2, L3 vertebral compression fractures s/p recent kyphoplasty Slow improvement of substantial low back pain Opiate and muscle relaxant regimen actively being titrated by palliative care, their input is appreciated Continuing short course of dexamethasone as well per their recommendations Continued attempts daily for patient to work with PT, OT Plan is for patient to eventually go to skilled nursing facility once bed is available and prior authorization is complete  Traumatic compression fracture of lumbar vertebra ([place]) Please see assessment and plan above  Multiple myeloma not having achieved remission [facility]) Case discussed with Dr. Lorenso Courier with oncology who states that it is okay for patient to continue Revlimid while hospitalized  Dr. Lorenso Courier recommends outpatient follow-up with him for evaluation as to when to resume Velcade.  AKI (acute kidney injury) ([place]) Improved with intravenous volume resuscitation Intravenous fluids have since been discontinued Encourage oral  intake Strict input and output monitoring Monitoring renal function and electrolytes with serial chemistries Avoiding nephrotoxic agents if at all possible   Essential hypertension Mostly normotensive without scheduled antihypertensives As needed intravenous antihypertensives for markedly elevated blood pressure.  GERD without esophagitis Continuing home regimen of daily PPI therapy.   Mixed hyperlipidemia Continuing home regimen of lipid lowering therapy.   Normocytic anemia In setting of multiple myeloma.   Stable, no clinical evidence of bleeding  Monitoring hemoglobin and hematocrit with periodic CBCs.         Subjective:  Patient states that his low back pain is slowly improving.  Patient describes the pain as "tight" in quality, moderate in intensity, worse with movement and improved with rest.  Pain has been ongoing for several weeks.  Physical Exam:  Vitals:   03/13/22 1331 03/13/22 2027 03/14/22 0551 03/14/22 1311  BP: 132/74 (!) 154/96 126/70 (!) 147/80  Pulse: 81 85 68 71  Resp: _0 Temp: 98.6 F (37 C) 98.9 F (37.2 C) 98.2 F (36.8 C) 98.8 F (37.1 C)  TempSrc: Oral Oral Oral Oral  SpO2: 98% 97% 98% 97%  Weight:      Height:        Constitutional: Awake alert and oriented x3, no associated distress.   Skin: no rashes, no lesions, good skin turgor noted. Eyes: Pupils are equally reactive to light.  No evidence of scleral icterus or conjunctival pallor.  ENMT: Moist mucous membranes noted.  Posterior pharynx clear of any exudate or lesions.   Respiratory: clear to auscultation bilaterally, no wheezing, no crackles. Normal respiratory effort. No accessory muscle use.  Cardiovascular: Regular rate and rhythm, no murmurs / rubs / gallops. No extremity edema. 2+ pedal pulses. No carotid bruits.  Back: Midline and paraspinal lumbar tenderness.  No crepitus or deformity. Abdomen: Abdomen is soft and nontender.  No evidence of intra-abdominal  masses.  Positive bowel sounds noted in all quadrants.   Musculoskeletal: No joint deformity upper and lower extremities. Good ROM, no contractures. Normal muscle tone.    Data Reviewed:  I have personally reviewed and interpreted labs, imaging.  Significant findings are   CBC: Recent Labs  Lab 03/08/22 0318 03/11/22 0322 03/12/22 0329 03/13/22 0328  WBC 5.2 6.9 4.9 3.7*  NEUTROABS  --   --   --  1.9  HGB 9.2* 9.1* 8.7* 8.9*  HCT 29.4* 29.5* 27.5* 27.9*  MCV 92.5 93.1 93.2 92.7  PLT 207 166 145* 861   Basic Metabolic Panel: Recent Labs  Lab 03/08/22 0318 03/11/22 0322 03/12/22 0329 03/13/22 0328 03/14/22 0324  NA 134* 130* 133* 132* 135  K 4.0 5.1 4.6 4.1 4.7  CL 104 102 105 100 101  CO2 24 21* _0 GLUCOSE 89 136* 99 104* 114*  BUN 17 55* 38* 24* 24*  CREATININE 1.01 2.01* 1.13 0.98 1.03  CALCIUM 9.4 10.4* 9.9 9.4 10.0  MG  --   --   --  1.8  --   PHOS  --   --  3.4  --   --    GFR: Estimated Creatinine Clearance: 103.4 mL/min (by C-G formula based on SCr of 1.03 mg/dL). Liver Function Tests: Recent Labs  Lab 03/12/22 0329 03/13/22 0328 03/14/22 0324  AST  --  50* 36  ALT  --  66* 78*  ALKPHOS  --  60 65  BILITOT  --  0.6 0.6  PROT  --  8.7* 9.6*  ALBUMIN 2.9* 2.8* 3.2*       Code Status:  Full code  code status decision has been confirmed with: patient    Severity of Illness:  The appropriate patient status for this patient is INPATIENT. Inpatient status is judged to be reasonable and necessary in order to provide the required intensity of service to ensure the patient's safety. The patient's presenting symptoms, physical exam findings, and initial radiographic and laboratory data in the context of their chronic comorbidities is felt to place them at high risk for further clinical deterioration. Furthermore, it is not anticipated that the patient will be medically stable for discharge from the hospital within 2 midnights of admission.   * I  certify that at the point of admission it is my clinical judgment that the patient will require inpatient hospital care spanning beyond 2 midnights from the point of admission due to high intensity of service, high risk for further deterioration and high frequency of surveillance required.*  Time spent:  33 minutes  Author:  Vernelle Emerald MD  03/14/2022 6:55 PM

## 2022-03-14 NOTE — Progress Notes (Signed)
Daily Progress Note   Patient Name: [name]       Date: 03/14/2022 DOB: 1971/02/23  Age: 51 y.o. MRN#: [id number] Attending Physician: Vernelle Emerald, MD Primary Care Physician: [name], MD Admit Date: 03/07/2022  Reason for Consultation/Follow-up: Establishing goals of care  Patient Profile/HPI:   51 y.o. male  with past medical history of  IgG kappa multiple myeloma on treatment with Velcade/Revlimid, recent admission after fall with pathologic compression fractures s/p ablation and kyphoplasty and fracture of humerus. Plan is to start zometa but on hold due to tooth extraction on 10/6. Now admitted on 03/07/2022 with worsening back pain.  MRI did not show any current acute changes. Palliative medicine consulted for assistance with back pain related to cancer.     Subjective: Chart reviewed including labs, progress notes, medication use.  [name] is awake and alert.  He is standing up in his room, and is walking with the walker. Reports pain is better controlled today and allowing for improved function.   Review of Systems  Musculoskeletal:  Positive for back pain.    Physical Exam Vitals and nursing note reviewed.  Constitutional:      Appearance: He is not ill-appearing.  Pulmonary:     Effort: Pulmonary effort is normal.  Neurological:     Mental Status: He is alert and oriented to person, place, and time.             Vital Signs: BP (!) 147/80 (BP Location: Right Arm)   Pulse 71   Temp 98.8 F (37.1 C) (Oral)   Resp 16   Ht _0  (1.753 m)   Wt 111.6 kg   SpO2 97%   BMI 36.33 kg/m  SpO2: SpO2: 97 % O2 Device: O2 Device: Room Air O2 Flow Rate:    Intake/output summary:  Intake/Output Summary (Last 24 hours) at 03/14/2022 1421 Last data filed at  03/14/2022 0600 Gross per 24 hour  Intake 840 ml  Output 600 ml  Net 240 ml    LBM: Last BM Date : 03/13/22 Baseline Weight: Weight: 111.6 kg Most recent weight: Weight: 111.6 kg       Palliative Assessment/Data: PPS: 60%      Patient Active Problem List   Diagnosis Date Noted   AKI (acute kidney injury) ([place]) 03/11/2022  Intractable low back pain 03/07/2022   Pathologic fracture 02/17/2022   Traumatic compression fracture of lumbar vertebra ([place]) 02/17/2022   Hyponatremia 02/17/2022   Fall at home, initial encounter 02/17/2022   Obesity (BMI 30-39.9) 02/17/2022   Multiple myeloma ([place]) 02/16/2022   Multiple myeloma not having achieved remission ([place]) 02/16/2022   Mixed hyperlipidemia 02/17/2019   Lesion of liver 09/23/2017   GERD (gastroesophageal reflux disease) 09/23/2017   Atypical chest pain 05/01/2016   Abnormal ultrasound 07/19/2015   ALCOHOL ABUSE 09/06/2008   TOBACCO ABUSE 09/06/2008   Essential hypertension 09/06/2008   GERD without esophagitis 09/06/2008    Palliative Care Assessment & Plan    Assessment/Recommendations/Plan  Dexamethasone 4 mg BID x 4 days, continue Protonix for GI prophylaxis- consulted pharmacy and Oncology- ok to take with Revlimid- pt needs to be discharged with 65m aspirin- attending notified Continue MS Contin 60 mg every 8 hours Continue Oxycodone IR  to 20 mg every 6 hours Continue senna 2 tablets twice daily D/C Miralax D/C tizanidine d/t interaction with acyclovir Start robaxin 1000 mg TID, change robaxin IV to 5041mBID prn (greatly appreciate pharmacy assistance with medication management) PMT will continue to follow - [facility]  Code Status: Full code  Prognosis:  Unable to determine  [place] [facility] [place] [facility]  Care plan was discussed with patient and care team  Thank you for  allowing the Palliative Medicine Team to assist in the care of this patient.   Greater than 50%  of this time was spent counseling and coordinating care related to the above assessment and plan.  KaMariana KaufmanAGNP-C Palliative Medicine   Please contact Palliative Medicine Team phone at [phone] questions and concerns.

## 2022-03-14 NOTE — Plan of Care (Signed)
?  Problem: Activity: ?Goal: Risk for activity intolerance will decrease ?Outcome: Progressing ?  ?Problem: Safety: ?Goal: Ability to remain free from injury will improve ?Outcome: Progressing ?  ?Problem: Pain Managment: ?Goal: General experience of comfort will improve ?Outcome: Progressing ?  ?

## 2022-03-15 DIAGNOSIS — N179 Acute kidney failure, unspecified: Secondary | ICD-10-CM | POA: Diagnosis not present

## 2022-03-15 DIAGNOSIS — Z515 Encounter for palliative care: Secondary | ICD-10-CM

## 2022-03-15 DIAGNOSIS — K219 Gastro-esophageal reflux disease without esophagitis: Secondary | ICD-10-CM | POA: Diagnosis not present

## 2022-03-15 DIAGNOSIS — M5459 Other low back pain: Secondary | ICD-10-CM | POA: Diagnosis not present

## 2022-03-15 DIAGNOSIS — C9000 Multiple myeloma not having achieved remission: Secondary | ICD-10-CM | POA: Diagnosis not present

## 2022-03-15 DIAGNOSIS — I10 Essential (primary) hypertension: Secondary | ICD-10-CM | POA: Diagnosis not present

## 2022-03-15 DIAGNOSIS — M545 Low back pain, unspecified: Secondary | ICD-10-CM | POA: Diagnosis not present

## 2022-03-15 LAB — COMPREHENSIVE METABOLIC PANEL
ALT: 86 U/L — ABNORMAL HIGH (ref 0–44)
AST: 34 U/L (ref 15–41)
Albumin: 3.1 g/dL — ABNORMAL LOW (ref 3.5–5.0)
Alkaline Phosphatase: 68 U/L (ref 38–126)
Anion gap: 8 (ref 5–15)
BUN: 24 mg/dL — ABNORMAL HIGH (ref 6–20)
CO2: 24 mmol/L (ref 22–32)
Calcium: 9.6 mg/dL (ref 8.9–10.3)
Chloride: 99 mmol/L (ref 98–111)
Creatinine, Ser: 1.05 mg/dL (ref 0.61–1.24)
GFR, Estimated: >60 mL/min (ref >60)
Glucose, Bld: 110 mg/dL — ABNORMAL HIGH (ref 70–99)
Potassium: 4.5 mmol/L (ref 3.5–5.1)
Sodium: 131 mmol/L — ABNORMAL LOW (ref 135–145)
Total Bilirubin: 0.6 mg/dL (ref 0.3–1.2)
Total Protein: 9.3 g/dL — ABNORMAL HIGH (ref 6.5–8.1)

## 2022-03-15 LAB — GLUCOSE, CAPILLARY
Glucose-Capillary: 99 mg/dL (ref 70–99)
Glucose-Capillary: 122 mg/dL — ABNORMAL HIGH (ref 70–99)
Glucose-Capillary: 133 mg/dL — ABNORMAL HIGH (ref 70–99)
Glucose-Capillary: 121 mg/dL — ABNORMAL HIGH (ref 70–99)

## 2022-03-15 LAB — CBC WITH DIFFERENTIAL/PLATELET
Abs Immature Granulocytes: 0.04 10*3/uL (ref 0.00–0.07)
Basophils Absolute: 0 10*3/uL (ref 0.0–0.1)
Basophils Relative: 0 %
Eosinophils Absolute: 0.3 10*3/uL (ref 0.0–0.5)
Eosinophils Relative: 5 %
HCT: 33.1 % — ABNORMAL LOW (ref 39.0–52.0)
Hemoglobin: 10.5 g/dL — ABNORMAL LOW (ref 13.0–17.0)
Immature Granulocytes: 1 %
Lymphocytes Relative: 27 %
Lymphs Abs: 1.3 10*3/uL (ref 0.7–4.0)
MCH: 29.2 pg (ref 26.0–34.0)
MCHC: 31.7 g/dL (ref 30.0–36.0)
MCV: 92.2 fL (ref 80.0–100.0)
Monocytes Absolute: 0.6 10*3/uL (ref 0.1–1.0)
Monocytes Relative: 13 %
Neutro Abs: 2.5 10*3/uL (ref 1.7–7.7)
Neutrophils Relative %: 54 %
Platelets: 163 10*3/uL (ref 150–400)
RBC: 3.59 MIL/uL — ABNORMAL LOW (ref 4.22–5.81)
RDW: 13.2 % (ref 11.5–15.5)
WBC: 4.7 10*3/uL (ref 4.0–10.5)
nRBC: 0.4 % — ABNORMAL HIGH (ref 0.0–0.2)

## 2022-03-15 MED ORDER — POLYETHYLENE GLYCOL 3350 17 G PO PACK: 17.0000 g | PACK | Freq: Every day | ORAL | Status: DC | PRN

## 2022-03-15 MED ORDER — METHOCARBAMOL 500 MG PO TABS
1000.0000 mg | ORAL_TABLET | Freq: Four times a day (QID) | ORAL | Status: DC
  Administered 2022-03-15 – 2022-03-17 (×9): 1000 mg via ORAL
  Filled 2022-03-15 (×9): qty 2

## 2022-03-15 MED ORDER — AMLODIPINE BESYLATE 5 MG PO TABS
5.0000 mg | ORAL_TABLET | Freq: Every day | ORAL | Status: DC
  Administered 2022-03-15 – 2022-03-17 (×3): 5 mg via ORAL
  Filled 2022-03-15 (×3): qty 1

## 2022-03-15 NOTE — Progress Notes (Signed)
PROGRESS NOTE   [name]  UXL:[id number] DOB: 11/26/1970 DOA: 03/07/2022 PCP: [name], MD   Date of Service: the patient was seen and examined on 03/15/2022  Brief Narrative:  [name] is a 51 y.o. male with medical history significant for IgG kappa multiple myeloma on active treatment with Velcade/Revlimid, pathologic left humeral fracture (s/p IM nail 02/22/2022) and L1, L2, L3 compression fractures s/p radiofrequency ablation and kyphoplasty with L1-L2 on 02/19/2022 and L3 on 02/05/2022, HTN, anemia of chronic disease who is admitted with intractable lower back pain.  Patient remained in the hospital service for intractable pain with titration of opiate-based analgesics and muscle relaxants with the assistance of the palliative care service.  PT evaluation revealed the patient would likely benefit from repeat a skilled nursing facility.  Currently awaiting skilled nursing facility prior authorization and bed availability.   Assessment and Plan: * Chronic midline low back pain without sciatica L1, L2, L3 vertebral compression fractures s/p recent kyphoplasty Slow improvement of substantial low back pain Opiate and muscle relaxant regimen actively being titrated by palliative care, their input is appreciated Continuing short course of dexamethasone as well per their recommendations Continued attempts daily for patient to work with PT, OT Plan is for patient to eventually go to skilled nursing facility once bed is available and prior authorization is complete  Traumatic compression fracture of lumbar vertebra ([place]) Please see assessment and plan above  Multiple myeloma not having achieved remission [facility]) Case discussed with Dr. Lorenso Courier with oncology who states that it is okay for patient to continue Revlimid while hospitalized  Dr. Lorenso Courier recommends outpatient follow-up with him for evaluation as to when to resume Velcade.  AKI (acute kidney injury) ([place]) Improved  with intravenous volume resuscitation Intravenous fluids have since been discontinued Encourage oral intake Strict input and output monitoring Monitoring renal function and electrolytes with serial chemistries Avoiding nephrotoxic agents if at all possible   Essential hypertension Mostly normotensive without scheduled antihypertensives As needed intravenous antihypertensives for markedly elevated blood pressure.  GERD without esophagitis Continuing home regimen of daily PPI therapy.   Mixed hyperlipidemia Continuing home regimen of lipid lowering therapy.   Normocytic anemia In setting of multiple myeloma.   Stable, no clinical evidence of bleeding  Monitoring hemoglobin and hematocrit with periodic CBCs.         Subjective:  Patient complaining of continued low back pain, 6 out of 10 in intensity, tight in quality, worse with movement.  Patient states that his mobility is slowly improving.  Physical Exam:  Vitals:   03/14/22 1311 03/14/22 2040 03/15/22 0528 03/15/22 1335  BP: (!) 147/80 (!) 158/77 (!) 164/92 136/85  Pulse: 71 70 75 64  Resp: $Remo'16 16 16 14  'KznJV$ Temp: 98.8 F (37.1 C) 98.5 F (36.9 C) 98.5 F (36.9 C) 98.2 F (36.8 C)  TempSrc: Oral Oral Oral Oral  SpO2: 97% 99% 98% 97%  Weight:      Height:         Constitutional: Awake alert and oriented x3, no associated distress.   Skin: no rashes, no lesions, good skin turgor noted. Eyes: Pupils are equally reactive to light.  No evidence of scleral icterus or conjunctival pallor.  ENMT: Moist mucous membranes noted.  Posterior pharynx clear of any exudate or lesions.   Respiratory: clear to auscultation bilaterally, no wheezing, no crackles. Normal respiratory effort. No accessory muscle use.  Cardiovascular: Regular rate and rhythm, no murmurs / rubs / gallops. No extremity edema.  2+ pedal pulses. No carotid bruits.  Abdomen: Abdomen is soft and nontender.  No evidence of intra-abdominal masses.  Positive  bowel sounds noted in all quadrants.   Musculoskeletal: No joint deformity upper and lower extremities. Good ROM, no contractures. Normal muscle tone.  Back: Midline lumbar tenderness without crepitus or deformity with associated paraspinal tenderness.   Data Reviewed:  I have personally reviewed and interpreted labs, imaging.  Significant findings are  CBC: Recent Labs  Lab 03/11/22 0322 03/12/22 0329 03/13/22 0328 03/15/22 0340  WBC 6.9 4.9 3.7* 4.7  NEUTROABS  --   --  1.9 2.5  HGB 9.1* 8.7* 8.9* 10.5*  HCT 29.5* 27.5* 27.9* 33.1*  MCV 93.1 93.2 92.7 92.2  PLT 166 145* 152 518   Basic Metabolic Panel: Recent Labs  Lab 03/11/22 0322 03/12/22 0329 03/13/22 0328 03/14/22 0324 03/15/22 0340  NA 130* 133* 132* 135 131*  K 5.1 4.6 4.1 4.7 4.5  CL 102 105 100 101 99  CO2 21* $Remov'23 26 28 24  'OQarFH$ GLUCOSE 136* 99 104* 114* 110*  BUN 55* 38* 24* 24* 24*  CREATININE 2.01* 1.13 0.98 1.03 1.05  CALCIUM 10.4* 9.9 9.4 10.0 9.6  MG  --   --  1.8  --   --   PHOS  --  3.4  --   --   --    GFR: Estimated Creatinine Clearance: 101.4 mL/min (by C-G formula based on SCr of 1.05 mg/dL). Liver Function Tests: Recent Labs  Lab 03/12/22 0329 03/13/22 0328 03/14/22 0324 03/15/22 0340  AST  --  50* 36 34  ALT  --  66* 78* 86*  ALKPHOS  --  60 65 68  BILITOT  --  0.6 0.6 0.6  PROT  --  8.7* 9.6* 9.3*  ALBUMIN 2.9* 2.8* 3.2* 3.1*    Code Status:  Full code     Severity of Illness:  The appropriate patient status for this patient is INPATIENT. Inpatient status is judged to be reasonable and necessary in order to provide the required intensity of service to ensure the patient's safety. The patient's presenting symptoms, physical exam findings, and initial radiographic and laboratory data in the context of their chronic comorbidities is felt to place them at high risk for further clinical deterioration. Furthermore, it is not anticipated that the patient will be medically stable for  discharge from the hospital within 2 midnights of admission.   * I certify that at the point of admission it is my clinical judgment that the patient will require inpatient hospital care spanning beyond 2 midnights from the point of admission due to high intensity of service, high risk for further deterioration and high frequency of surveillance required.*  Time spent:  34 minutes  Author:  Vernelle Emerald MD  03/15/2022 8:50 PM

## 2022-03-15 NOTE — Progress Notes (Signed)
Daily Progress Note   Patient Name: [name]       Date: 03/15/2022 DOB: [birth year]  Age: 51 y.o. MRN#: [id number] Attending Physician: Vernelle Emerald, MD Primary Care Physician: [name], MD Admit Date: 03/07/2022  Reason for Consultation/Follow-up: Establishing goals of care  Patient Profile/HPI:   51 y.o. male  with past medical history of  IgG kappa multiple myeloma on treatment with Velcade/Revlimid, recent admission after fall with pathologic compression fractures s/p ablation and kyphoplasty and fracture of humerus. Plan is to start zometa but on hold due to tooth extraction on 10/6. Now admitted on 03/07/2022 with worsening back pain.  MRI did not show any current acute changes. Palliative medicine consulted for assistance with back pain related to cancer.     Subjective: Chart reviewed including labs, progress notes, medication use.  [name] is awake and alert.  He reports improved pain control. Notes that when he is not moving has not pain, when walking lower back feels tight.  Last BM 10/19- he feels like things are moving.  He agrees to outpatient Palliative f/u at [facility].   Review of Systems  Musculoskeletal:  Positive for back pain.    Physical Exam Vitals and nursing note reviewed.  Constitutional:      Appearance: He is not ill-appearing.  Pulmonary:     Effort: Pulmonary effort is normal.  Neurological:     Mental Status: He is alert and oriented to person, place, and time.             Vital Signs: BP (!) 164/92 (BP Location: Right Arm)   Pulse 75   Temp 98.5 F (36.9 C) (Oral)   Resp 16   Ht $R'5\' 9"'Ss$  (1.753 m)   Wt 111.6 kg   SpO2 98%   BMI 36.33 kg/m  SpO2: SpO2: 98 % O2 Device: O2 Device: Room Air O2 Flow Rate:    Intake/output summary:   Intake/Output Summary (Last 24 hours) at 03/15/2022 1155 Last data filed at 03/15/2022 1139 Gross per 24 hour  Intake 580 ml  Output 950 ml  Net -370 ml   LBM: Last BM Date : 03/13/22 Baseline Weight: Weight: 111.6 kg Most recent weight: Weight: 111.6 kg       Palliative Assessment/Data: PPS: 60%      Patient Active Problem  List   Diagnosis Date Noted   AKI (acute kidney injury) ([place]) 03/11/2022   Intractable low back pain 03/07/2022   Pathologic fracture 02/17/2022   Traumatic compression fracture of lumbar vertebra ([place]) 02/17/2022   Normocytic anemia 02/17/2022   Hyponatremia 02/17/2022   Fall at home, initial encounter 02/17/2022   Obesity (BMI 30-39.9) 02/17/2022   Multiple myeloma ([place]) 02/16/2022   Multiple myeloma not having achieved remission ([place]) 02/16/2022   Mixed hyperlipidemia 02/17/2019   Lesion of liver 09/23/2017   GERD (gastroesophageal reflux disease) 09/23/2017   Atypical chest pain 05/01/2016   Abnormal ultrasound 07/19/2015   ALCOHOL ABUSE 09/06/2008   TOBACCO ABUSE 09/06/2008   Essential hypertension 09/06/2008   GERD without esophagitis 09/06/2008    Palliative Care Assessment & Plan    Assessment/Recommendations/Plan  Dexamethasone 4 mg BID x 4 days- last dose scheduled to be 10/23, continue Protonix for GI prophylaxis- consulted pharmacy and Oncology- ok to take with Revlimid- pt needs to be discharged with $RemoveBeforeD'81mg'KkadOYFRHoPGVG$  aspirin- attending notified Continue MS Contin 60 mg every 8 hours Continue Oxycodone IR  to 20 mg every 6 hours Continue senna 2 tablets twice daily Miralax 17g daily prn for constipation Change robaxin 1000 mg TID to QID, d/c robaxin IV (greatly appreciate pharmacy assistance with medication management) Referral for outpatient f/u with Palliative provider at [facility] after discharge  Code Status: Full code  Prognosis:  Unable to determine  Discharge Planning: [place] for rehab with Palliative care  service follow-up  Care plan was discussed with patient and care team  Thank you for allowing the Palliative Medicine Team to assist in the care of this patient.  Greater than 50%  of this time was spent counseling and coordinating care related to the above assessment and plan.  Mariana Kaufman, AGNP-C Palliative Medicine   Please contact Palliative Medicine Team phone at [phone] for questions and concerns.

## 2022-03-15 NOTE — Progress Notes (Signed)
Brief Pharmacy Note  Pharmacy dispensing lenalidomide from patient home supply. He takes a 14 day course starting day 1 of chemotherapy cycle. Home medication has been dispensed in full - it appears day 1 of cycle may have been 10/4? Will discontinue further doses, follow along for plan to resume chemotherapy.   Tawnya Crook, PharmD, BCPS Clinical Pharmacist 03/15/2022 9:04 AM

## 2022-03-15 NOTE — Plan of Care (Signed)
  Problem: Activity: Goal: Risk for activity intolerance will decrease Outcome: Progressing   Problem: Pain Managment: Goal: General experience of comfort will improve Outcome: Progressing   Problem: Safety: Goal: Ability to remain free from injury will improve Outcome: Progressing   

## 2022-03-16 DIAGNOSIS — N179 Acute kidney failure, unspecified: Secondary | ICD-10-CM | POA: Diagnosis not present

## 2022-03-16 DIAGNOSIS — S42202A Unspecified fracture of upper end of left humerus, initial encounter for closed fracture: Secondary | ICD-10-CM | POA: Diagnosis present

## 2022-03-16 DIAGNOSIS — M5459 Other low back pain: Secondary | ICD-10-CM | POA: Diagnosis not present

## 2022-03-16 DIAGNOSIS — S42202D Unspecified fracture of upper end of left humerus, subsequent encounter for fracture with routine healing: Secondary | ICD-10-CM

## 2022-03-16 DIAGNOSIS — K5903 Drug induced constipation: Secondary | ICD-10-CM | POA: Diagnosis present

## 2022-03-16 DIAGNOSIS — T402X5A Adverse effect of other opioids, initial encounter: Secondary | ICD-10-CM

## 2022-03-16 LAB — GLUCOSE, CAPILLARY
Glucose-Capillary: 100 mg/dL — ABNORMAL HIGH (ref 70–99)
Glucose-Capillary: 112 mg/dL — ABNORMAL HIGH (ref 70–99)
Glucose-Capillary: 113 mg/dL — ABNORMAL HIGH (ref 70–99)
Glucose-Capillary: 133 mg/dL — ABNORMAL HIGH (ref 70–99)

## 2022-03-16 MED ORDER — POLYETHYLENE GLYCOL 3350 17 G PO PACK
17.0000 g | PACK | Freq: Every day | ORAL | Status: DC
  Administered 2022-03-17: 17 g via ORAL
  Filled 2022-03-16: qty 1

## 2022-03-16 NOTE — Assessment & Plan Note (Signed)
   Increasing constipation likely due to opiate therapy  Increasing bowel regimen by adding daily MiraLAX  Continue daily as needed MiraLAX

## 2022-03-16 NOTE — TOC Progression Note (Signed)
Transition of Care [facility]) - Progression Note    Patient Details  Name: GABRIEN MENTINK MRN: [id number] Date of Birth: 09-Jan-1971  Transition of Care [facility]) CM/SW Contact  Henrietta Dine, RN Phone Number: 03/16/2022, 11:13 AM  Clinical Narrative:    Pt accepted at [facility]; notified by Glendon Axe, Admissions Coordinator still awaiting insurance auth.   Expected Discharge Plan: [place] Barriers to Discharge: Continued Medical Work up  Expected Discharge Plan and Services Expected Discharge Plan: [place] In-house Referral: Clinical Social Work     Living arrangements for the past 2 months: Single Family Home                                       Social Determinants of Health (SDOH) Interventions    Readmission Risk Interventions    02/19/2022    3:30 PM  Readmission Risk Prevention Plan  Post Dischage Appt Complete  Medication Screening Complete  Transportation Screening Complete

## 2022-03-16 NOTE — Assessment & Plan Note (Signed)
   Pathological fracture secondary to multiple myeloma  Underwent medullary nailing of the left humeral shaft on 02/22/2022 by Dr. Victorino December with orthopedic surgery.  Patient should follow-up with Dr. Stann Mainland orthopedic surgery at time of discharge.

## 2022-03-16 NOTE — Progress Notes (Signed)
PROGRESS NOTE   [name]  SMO:[id number] DOB: [birth year] DOA: 03/07/2022 PCP: [name], MD   Date of Service: the patient was seen and examined on 03/16/2022  Brief Narrative:  [name] is a 51 y.o. male with medical history significant for IgG kappa multiple myeloma on active treatment with Velcade/Revlimid, pathologic left humeral fracture (s/p IM nail 02/22/2022) and L1, L2, L3 compression fractures s/p radiofrequency ablation and kyphoplasty with L1-L2 on 02/19/2022 and L3 on 02/05/2022, HTN, anemia of chronic disease who is admitted with intractable lower back pain.  Patient remained in the hospital service for intractable pain with titration of opiate-based analgesics and muscle relaxants with the assistance of the palliative care service.  PT evaluation revealed the patient would likely benefit from repeat a skilled nursing facility.  Currently awaiting skilled nursing facility prior authorization and bed availability.   Assessment and Plan: * Intractable low back pain L1, L2, L3 vertebral compression fractures s/p recent kyphoplasty performed by Dr. Pieter Partridge Dawley with neurosurgery on 02/19/2022 Slow improvement of substantial low back pain Opiate and muscle relaxant regimen actively being titrated by palliative care, their input is appreciated Continuing short course of dexamethasone as well per their recommendations Continued attempts daily for patient to work with PT, OT Plan is for patient to eventually go to skilled nursing facility once bed is available and prior authorization is complete  Closed fracture of proximal end of left humerus Pathological fracture secondary to multiple myeloma Underwent medullary nailing of the left humeral shaft on 02/22/2022 by Dr. Victorino December with orthopedic surgery. Patient should follow-up with Dr. Stann Mainland orthopedic surgery at time of discharge.  Constipation due to opioid therapy Increasing constipation likely due to opiate  therapy Increasing bowel regimen by adding daily MiraLAX Continue daily as needed MiraLAX  Traumatic compression fracture of lumbar vertebra ([place]) Please see assessment and plan above  Multiple myeloma not having achieved remission [facility]) Case discussed with Dr. Lorenso Courier with oncology who states that it is okay for patient to continue Revlimid while hospitalized  Dr. Lorenso Courier recommends outpatient follow-up with him for evaluation as to when to resume Velcade.  AKI (acute kidney injury) ([place]) Improved with intravenous volume resuscitation Intravenous fluids have since been discontinued Encourage oral intake Strict input and output monitoring Monitoring renal function and electrolytes with serial chemistries Avoiding nephrotoxic agents if at all possible   Essential hypertension Mostly normotensive without scheduled antihypertensives As needed intravenous antihypertensives for markedly elevated blood pressure.  GERD without esophagitis Continuing home regimen of daily PPI therapy.   Mixed hyperlipidemia Continuing home regimen of lipid lowering therapy.   Normocytic anemia In setting of multiple myeloma.   Stable, no clinical evidence of bleeding  Monitoring hemoglobin and hematocrit with periodic CBCs.         Subjective:  Patient is complaining of constipation for at least the past 2 days.  Patient is additionally complaining of ongoing low back pain, moderate in intensity, tight in quality, essentially unchanged compared to yesterday.  Physical Exam:  Vitals:   03/15/22 1335 03/15/22 2105 03/16/22 0439 03/16/22 1341  BP: 136/85 130/69 (!) 130/96 (!) 143/76  Pulse: 64 63 63 66  Resp: _0 Temp: 98.2 F (36.8 C) 98.5 F (36.9 C) 98.2 F (36.8 C) 98.4 F (36.9 C)  TempSrc: Oral Oral Oral Oral  SpO2: 97% 100% 99% 95%  Weight:      Height:        Constitutional: Awake alert and oriented x3,  no associated distress.   Skin: no rashes, no lesions, good  skin turgor noted. Eyes: Pupils are equally reactive to light.  No evidence of scleral icterus or conjunctival pallor.  ENMT: Moist mucous membranes noted.  Posterior pharynx clear of any exudate or lesions.   Respiratory: clear to auscultation bilaterally, no wheezing, no crackles. Normal respiratory effort. No accessory muscle use.  Back: Midline and paraspinal lumbar tenderness without crepitus or deformity Cardiovascular: Regular rate and rhythm, no murmurs / rubs / gallops. No extremity edema. 2+ pedal pulses. No carotid bruits.  Abdomen: Abdomen is [place] but soft and nontender.  No evidence of intra-abdominal masses.  Positive bowel sounds noted in all quadrants.   Musculoskeletal: Postoperative left shoulder dressing in place.  No joint deformity upper and lower extremities. Good ROM, no contractures. Normal muscle tone.    Data Reviewed:  I have personally reviewed and interpreted labs, imaging.  Significant findings are   CBC: Recent Labs  Lab 03/11/22 0322 03/12/22 0329 03/13/22 0328 03/15/22 0340  WBC 6.9 4.9 3.7* 4.7  NEUTROABS  --   --  1.9 2.5  HGB 9.1* 8.7* 8.9* 10.5*  HCT 29.5* 27.5* 27.9* 33.1*  MCV 93.1 93.2 92.7 92.2  PLT 166 145* 152 638   Basic Metabolic Panel: Recent Labs  Lab 03/11/22 0322 03/12/22 0329 03/13/22 0328 03/14/22 0324 03/15/22 0340  NA 130* 133* 132* 135 131*  K 5.1 4.6 4.1 4.7 4.5  CL 102 105 100 101 99  CO2 21* _0 GLUCOSE 136* 99 104* 114* 110*  BUN 55* 38* 24* 24* 24*  CREATININE 2.01* 1.13 0.98 1.03 1.05  CALCIUM 10.4* 9.9 9.4 10.0 9.6  MG  --   --  1.8  --   --   PHOS  --  3.4  --   --   --    GFR: Estimated Creatinine Clearance: 101.4 mL/min (by C-G formula based on SCr of 1.05 mg/dL). Liver Function Tests: Recent Labs  Lab 03/12/22 0329 03/13/22 0328 03/14/22 0324 03/15/22 0340  AST  --  50* 36 34  ALT  --  66* 78* 86*  ALKPHOS  --  60 65 68  BILITOT  --  0.6 0.6 0.6  PROT  --  8.7* 9.6* 9.3*   ALBUMIN 2.9* 2.8* 3.2* 3.1*     Code Status:  Full code   Family Communication: Sister is at the bedside who has been updated on plan of care   Severity of Illness:  The appropriate patient status for this patient is INPATIENT. Inpatient status is judged to be reasonable and necessary in order to provide the required intensity of service to ensure the patient's safety. The patient's presenting symptoms, physical exam findings, and initial radiographic and laboratory data in the context of their chronic comorbidities is felt to place them at high risk for further clinical deterioration. Furthermore, it is not anticipated that the patient will be medically stable for discharge from the hospital within 2 midnights of admission.   * I certify that at the point of admission it is my clinical judgment that the patient will require inpatient hospital care spanning beyond 2 midnights from the point of admission due to high intensity of service, high risk for further deterioration and high frequency of surveillance required.*  Time spent:  31 minutes  Author:  Vernelle Emerald MD  03/16/2022 7:23 PM

## 2022-03-17 ENCOUNTER — Telehealth: Payer: Self-pay | Admitting: *Deleted

## 2022-03-17 ENCOUNTER — Inpatient Hospital Stay: Payer: BC Managed Care – PPO | Admitting: Family Medicine

## 2022-03-17 ENCOUNTER — Inpatient Hospital Stay (HOSPITAL_COMMUNITY): Payer: BC Managed Care – PPO

## 2022-03-17 DIAGNOSIS — Z5112 Encounter for antineoplastic immunotherapy: Secondary | ICD-10-CM | POA: Diagnosis not present

## 2022-03-17 DIAGNOSIS — M25512 Pain in left shoulder: Secondary | ICD-10-CM | POA: Diagnosis not present

## 2022-03-17 DIAGNOSIS — Z515 Encounter for palliative care: Secondary | ICD-10-CM | POA: Diagnosis not present

## 2022-03-17 DIAGNOSIS — E785 Hyperlipidemia, unspecified: Secondary | ICD-10-CM | POA: Diagnosis not present

## 2022-03-17 DIAGNOSIS — M62838 Other muscle spasm: Secondary | ICD-10-CM | POA: Diagnosis not present

## 2022-03-17 DIAGNOSIS — G894 Chronic pain syndrome: Secondary | ICD-10-CM | POA: Diagnosis not present

## 2022-03-17 DIAGNOSIS — R531 Weakness: Secondary | ICD-10-CM | POA: Diagnosis not present

## 2022-03-17 DIAGNOSIS — Z79899 Other long term (current) drug therapy: Secondary | ICD-10-CM | POA: Diagnosis not present

## 2022-03-17 DIAGNOSIS — I10 Essential (primary) hypertension: Secondary | ICD-10-CM | POA: Diagnosis not present

## 2022-03-17 DIAGNOSIS — F17210 Nicotine dependence, cigarettes, uncomplicated: Secondary | ICD-10-CM | POA: Diagnosis not present

## 2022-03-17 DIAGNOSIS — R2689 Other abnormalities of gait and mobility: Secondary | ICD-10-CM | POA: Diagnosis not present

## 2022-03-17 DIAGNOSIS — K449 Diaphragmatic hernia without obstruction or gangrene: Secondary | ICD-10-CM | POA: Diagnosis not present

## 2022-03-17 DIAGNOSIS — D630 Anemia in neoplastic disease: Secondary | ICD-10-CM | POA: Diagnosis not present

## 2022-03-17 DIAGNOSIS — M84422S Pathological fracture, left humerus, sequela: Secondary | ICD-10-CM | POA: Diagnosis not present

## 2022-03-17 DIAGNOSIS — E782 Mixed hyperlipidemia: Secondary | ICD-10-CM | POA: Diagnosis not present

## 2022-03-17 DIAGNOSIS — M4856XA Collapsed vertebra, not elsewhere classified, lumbar region, initial encounter for fracture: Secondary | ICD-10-CM | POA: Diagnosis not present

## 2022-03-17 DIAGNOSIS — N179 Acute kidney failure, unspecified: Secondary | ICD-10-CM | POA: Diagnosis not present

## 2022-03-17 DIAGNOSIS — E8809 Other disorders of plasma-protein metabolism, not elsewhere classified: Secondary | ICD-10-CM | POA: Diagnosis not present

## 2022-03-17 DIAGNOSIS — K5900 Constipation, unspecified: Secondary | ICD-10-CM | POA: Diagnosis not present

## 2022-03-17 DIAGNOSIS — Z7982 Long term (current) use of aspirin: Secondary | ICD-10-CM | POA: Diagnosis not present

## 2022-03-17 DIAGNOSIS — C9000 Multiple myeloma not having achieved remission: Secondary | ICD-10-CM | POA: Diagnosis not present

## 2022-03-17 DIAGNOSIS — Z7401 Bed confinement status: Secondary | ICD-10-CM | POA: Diagnosis not present

## 2022-03-17 DIAGNOSIS — R140 Abdominal distension (gaseous): Secondary | ICD-10-CM | POA: Diagnosis not present

## 2022-03-17 DIAGNOSIS — Z79624 Long term (current) use of inhibitors of nucleotide synthesis: Secondary | ICD-10-CM | POA: Diagnosis not present

## 2022-03-17 DIAGNOSIS — G893 Neoplasm related pain (acute) (chronic): Secondary | ICD-10-CM | POA: Diagnosis not present

## 2022-03-17 DIAGNOSIS — Z8042 Family history of malignant neoplasm of prostate: Secondary | ICD-10-CM | POA: Diagnosis not present

## 2022-03-17 DIAGNOSIS — R109 Unspecified abdominal pain: Secondary | ICD-10-CM | POA: Diagnosis not present

## 2022-03-17 DIAGNOSIS — R1013 Epigastric pain: Secondary | ICD-10-CM | POA: Diagnosis not present

## 2022-03-17 DIAGNOSIS — M545 Low back pain, unspecified: Secondary | ICD-10-CM | POA: Diagnosis not present

## 2022-03-17 DIAGNOSIS — M4802 Spinal stenosis, cervical region: Secondary | ICD-10-CM | POA: Diagnosis not present

## 2022-03-17 DIAGNOSIS — K219 Gastro-esophageal reflux disease without esophagitis: Secondary | ICD-10-CM | POA: Diagnosis not present

## 2022-03-17 DIAGNOSIS — M5459 Other low back pain: Secondary | ICD-10-CM | POA: Diagnosis not present

## 2022-03-17 DIAGNOSIS — K5903 Drug induced constipation: Secondary | ICD-10-CM | POA: Diagnosis not present

## 2022-03-17 DIAGNOSIS — I288 Other diseases of pulmonary vessels: Secondary | ICD-10-CM | POA: Diagnosis not present

## 2022-03-17 DIAGNOSIS — Z7961 Long term (current) use of immunomodulator: Secondary | ICD-10-CM | POA: Diagnosis not present

## 2022-03-17 DIAGNOSIS — S42202D Unspecified fracture of upper end of left humerus, subsequent encounter for fracture with routine healing: Secondary | ICD-10-CM | POA: Diagnosis not present

## 2022-03-17 DIAGNOSIS — R110 Nausea: Secondary | ICD-10-CM | POA: Diagnosis not present

## 2022-03-17 DIAGNOSIS — M6281 Muscle weakness (generalized): Secondary | ICD-10-CM | POA: Diagnosis not present

## 2022-03-17 DIAGNOSIS — D8481 Immunodeficiency due to conditions classified elsewhere: Secondary | ICD-10-CM | POA: Diagnosis not present

## 2022-03-17 DIAGNOSIS — M109 Gout, unspecified: Secondary | ICD-10-CM | POA: Diagnosis not present

## 2022-03-17 DIAGNOSIS — Z86010 Personal history of colonic polyps: Secondary | ICD-10-CM | POA: Diagnosis not present

## 2022-03-17 DIAGNOSIS — Z800 Family history of malignant neoplasm of digestive organs: Secondary | ICD-10-CM | POA: Diagnosis not present

## 2022-03-17 DIAGNOSIS — S32000S Wedge compression fracture of unspecified lumbar vertebra, sequela: Secondary | ICD-10-CM | POA: Diagnosis not present

## 2022-03-17 LAB — COMPREHENSIVE METABOLIC PANEL
ALT: 104 U/L — ABNORMAL HIGH (ref 0–44)
AST: 30 U/L (ref 15–41)
Albumin: 3.2 g/dL — ABNORMAL LOW (ref 3.5–5.0)
Alkaline Phosphatase: 78 U/L (ref 38–126)
Anion gap: 7 (ref 5–15)
BUN: 27 mg/dL — ABNORMAL HIGH (ref 6–20)
CO2: 27 mmol/L (ref 22–32)
Calcium: 9.7 mg/dL (ref 8.9–10.3)
Chloride: 96 mmol/L — ABNORMAL LOW (ref 98–111)
Creatinine, Ser: 1.04 mg/dL (ref 0.61–1.24)
GFR, Estimated: >60 mL/min (ref >60)
Glucose, Bld: 118 mg/dL — ABNORMAL HIGH (ref 70–99)
Potassium: 4.7 mmol/L (ref 3.5–5.1)
Sodium: 130 mmol/L — ABNORMAL LOW (ref 135–145)
Total Bilirubin: 0.8 mg/dL (ref 0.3–1.2)
Total Protein: 9.4 g/dL — ABNORMAL HIGH (ref 6.5–8.1)

## 2022-03-17 LAB — CBC WITH DIFFERENTIAL/PLATELET
Abs Immature Granulocytes: 0.03 10*3/uL (ref 0.00–0.07)
Basophils Absolute: 0 10*3/uL (ref 0.0–0.1)
Basophils Relative: 0 %
Eosinophils Absolute: 0.1 10*3/uL (ref 0.0–0.5)
Eosinophils Relative: 3 %
HCT: 32.8 % — ABNORMAL LOW (ref 39.0–52.0)
Hemoglobin: 10.5 g/dL — ABNORMAL LOW (ref 13.0–17.0)
Immature Granulocytes: 1 %
Lymphocytes Relative: 39 %
Lymphs Abs: 2 10*3/uL (ref 0.7–4.0)
MCH: 29.2 pg (ref 26.0–34.0)
MCHC: 32 g/dL (ref 30.0–36.0)
MCV: 91.4 fL (ref 80.0–100.0)
Monocytes Absolute: 0.6 10*3/uL (ref 0.1–1.0)
Monocytes Relative: 12 %
Neutro Abs: 2.3 10*3/uL (ref 1.7–7.7)
Neutrophils Relative %: 45 %
Platelets: 201 10*3/uL (ref 150–400)
RBC: 3.59 MIL/uL — ABNORMAL LOW (ref 4.22–5.81)
RDW: 13.5 % (ref 11.5–15.5)
WBC: 5.1 10*3/uL (ref 4.0–10.5)
nRBC: 0 % (ref 0.0–0.2)

## 2022-03-17 LAB — MAGNESIUM: Magnesium: 2.3 mg/dL (ref 1.7–2.4)

## 2022-03-17 LAB — GLUCOSE, CAPILLARY
Glucose-Capillary: 91 mg/dL (ref 70–99)
Glucose-Capillary: 102 mg/dL — ABNORMAL HIGH (ref 70–99)

## 2022-03-17 MED ORDER — POLYETHYLENE GLYCOL 3350 17 G PO PACK: 17.0000 g | PACK | Freq: Every day | ORAL | 0 refills | Status: DC | PRN

## 2022-03-17 MED ORDER — MORPHINE SULFATE ER 60 MG PO TBCR: 60.0000 mg | EXTENDED_RELEASE_TABLET | Freq: Three times a day (TID) | ORAL | 0 refills | Status: AC

## 2022-03-17 MED ORDER — OXYCODONE HCL 20 MG PO TABS: 20.0000 mg | ORAL_TABLET | Freq: Four times a day (QID) | ORAL | 0 refills | Status: AC

## 2022-03-17 MED ORDER — AMLODIPINE BESYLATE 5 MG PO TABS: 5.0000 mg | ORAL_TABLET | Freq: Every day | ORAL | Status: DC

## 2022-03-17 MED ORDER — GABAPENTIN 100 MG PO CAPS: 100.0000 mg | ORAL_CAPSULE | Freq: Every day | ORAL | Status: DC

## 2022-03-17 MED ORDER — POLYETHYLENE GLYCOL 3350 17 G PO PACK: 17.0000 g | PACK | Freq: Every day | ORAL | 0 refills | Status: DC

## 2022-03-17 MED ORDER — METHOCARBAMOL 1000 MG PO TABS: 1000.0000 mg | ORAL_TABLET | Freq: Four times a day (QID) | ORAL | Status: DC | PRN

## 2022-03-17 NOTE — Discharge Summary (Signed)
Physician Discharge Summary   Patient: [name] MRN: [id number] DOB: 01/21/1971  Admit date:     03/07/2022  Discharge date: 03/17/22  Discharge Physician: Vernelle Emerald   PCP: [name], MD   Recommendations at discharge:   Patient to follow-up with his oncologist Dr. Lorenso Courier in approximately 1 week. Patient to follow-up with his primary care provider Dr. [name] in 1 to 2 weeks. Patient to follow-up with his orthopedic surgeon Dr. Stann Mainland 4 weeks after discharge.  4 Discharge Diagnoses: Principal Problem:   Intractable low back pain Active Problems:   Traumatic compression fracture of lumbar vertebra (HCC)   Constipation due to opioid therapy   Closed fracture of proximal end of left humerus   Multiple myeloma not having achieved remission (HCC)   AKI (acute kidney injury) ([place])   Essential hypertension   GERD without esophagitis   Mixed hyperlipidemia   Normocytic anemia   Palliative care by specialist  Resolved Problems:   * No resolved hospital problems. *   Hospital Course: [name] is a 51 y.o. male with medical history significant for IgG kappa multiple myeloma on active treatment with Velcade/Revlimid, pathologic left humeral fracture (s/p IM nail 02/22/2022) and L1, L2, L3 compression fractures s/p radiofrequency ablation and kyphoplasty with L1-L2 on 02/19/2022 and L3 on 02/05/2022, HTN, anemia of chronic disease who is admitted with intractable lower back pain.  Patient remained in the hospital service for intractable pain with titration of opiate-based analgesics and muscle relaxants with the assistance of the palliative care service.  These medications were titrated to good effect allowing the patient to work with physical therapy.  Physical therapy did begin to work with the patient and recommended the patient would benefit from short-term skilled rehab.    Throughout the hospitalization, Dr. Lorenso Courier with oncology provided guidance on  management of patient's Revlimid and recommended initiation of aspirin 81 mg daily at time of discharge with plans for him to follow-up closely with Dr. Lorenso Courier at time of discharge.  As the patient clinically improved range made for the patient to be discharged to a skilled nursing facility.  Patient was discharged in improved and stable condition on 03/17/2022    Pain control - [facility] Controlled Substance Reporting System database was reviewed. and patient was instructed, not to drive, operate heavy machinery, perform activities at heights, swimming or participation in water activities or provide baby-sitting services while on Pain, Sleep and Anxiety Medications; until their outpatient Physician has advised to do so again. Also recommended to not to take more than prescribed Pain, Sleep and Anxiety Medications.   Consultants: Mariana Kaufman with Pain Management/Palliative Care Procedures performed: None  Disposition: Skilled nursing facility Diet recommendation:  Discharge Diet Orders (From admission, onward)     Start     Ordered   03/17/22 0000  Diet - low sodium heart healthy        03/17/22 1201           Cardiac diet  DISCHARGE MEDICATION: Allergies as of 03/17/2022   No Known Allergies      Medication List     STOP taking these medications    hydrochlorothiazide 25 MG tablet Commonly known as: HYDRODIURIL   ibuprofen 600 MG tablet Commonly known as: ADVIL   lisinopril 40 MG tablet Commonly known as: ZESTRIL   metoprolol tartrate 50 MG tablet Commonly known as: LOPRESSOR       TAKE these medications    Acetaminophen Extra Strength 500  MG Tabs Take 1 tablet (500 mg total) by mouth every 8 (eight) hours as needed for mild pain or moderate pain.   acyclovir 400 MG tablet Commonly known as: ZOVIRAX Take 1 tablet (400 mg total) by mouth 2 (two) times daily.   allopurinol 300 MG tablet Commonly known as: ZYLOPRIM Take 1 tablet (300 mg total) by mouth  daily.   amLODipine 5 MG tablet Commonly known as: NORVASC Take 1 tablet (5 mg total) by mouth daily. Start taking on: March 18, 2022   aspirin EC 81 MG tablet Take 81 mg by mouth daily. Swallow whole.   docusate sodium 100 MG capsule Commonly known as: COLACE Take 1 capsule (100 mg total) by mouth 2 (two) times daily as needed for mild constipation.   gabapentin 100 MG capsule Commonly known as: NEURONTIN Take 1 capsule (100 mg total) by mouth at bedtime.   lenalidomide 25 MG capsule Commonly known as: REVLIMID Take 1 capsule (25 mg total) by mouth daily. Take for 14 days and then none for 7 days. Repeat every 21 days. [employer] Auth # [id number] Date Obtained 03/05/22   methocarbamol 500 MG tablet Commonly known as: ROBAXIN Take 500 mg by mouth 3 (three) times daily. What changed: Another medication with the same name was added. Make sure you understand how and when to take each.   Methocarbamol 1000 MG Tabs Take 1,000 mg by mouth every 6 (six) hours as needed for muscle spasms. What changed: You were already taking a medication with the same name, and this prescription was added. Make sure you understand how and when to take each.   morphine 60 MG 12 hr tablet Commonly known as: MS CONTIN Take 1 tablet (60 mg total) by mouth every 8 (eight) hours for 3 days. What changed:  medication strength when to take this   ondansetron 8 MG tablet Commonly known as: ZOFRAN Take 1 tablet (8 mg total) by mouth every 8 (eight) hours as needed for nausea or vomiting.   Oxycodone HCl 20 MG Tabs Take 1 tablet (20 mg total) by mouth every 6 (six) hours for 3 days. What changed:  medication strength how much to take when to take this reasons to take this   pantoprazole 40 MG tablet Commonly known as: PROTONIX Take 1 tablet (40 mg total) by mouth daily.   polyethylene glycol powder 17 GM/SCOOP powder Commonly known as: GLYCOLAX/MIRALAX Take 1 capful (17 g) with water by mouth  daily.   prochlorperazine 10 MG tablet Commonly known as: COMPAZINE Take 1 tablet (10 mg total) by mouth every 6 (six) hours as needed for nausea or vomiting.   rosuvastatin 10 MG tablet Commonly known as: CRESTOR Take 10 mg by mouth daily.               Discharge Care Instructions  (From admission, onward)           Start     Ordered   03/17/22 0000  No dressing needed        03/17/22 1201            Contact information for follow-up providers     Orson Slick, MD. Schedule an appointment as soon as possible for a visit in 1 week(s).   Specialty: Hematology and Oncology Contact information: [place] [address] [phone]         Orth[facility], [facility]. Schedule an appointment as soon as possible for a visit in 1 week(s).  Specialty: Specialist Contact information: [address] [place] [address] [phone]              Contact information for after-discharge care     Destination     [facility] Preferred SNF .   Service: Skilled Nursing Contact information: [address] [place] [phone]                     Discharge Exam: Danley Danker Weights   03/07/22 1322  Weight: 111.6 kg   Constitutional: Awake alert and oriented x3, no associated distress.   Respiratory: clear to auscultation bilaterally, no wheezing, no crackles. Normal respiratory effort. No accessory muscle use.  Cardiovascular: Regular rate and rhythm, no murmurs / rubs / gallops. No extremity edema. 2+ pedal pulses. No carotid bruits.  Abdomen: Abdomen is soft and nontender.  No evidence of intra-abdominal masses.  Positive bowel sounds noted in all quadrants.   Musculoskeletal: No joint deformity upper and lower extremities. Good ROM, no contractures. Normal muscle tone.     Condition at discharge: fair  The results of significant diagnostics from this hospitalization (including  imaging, microbiology, ancillary and laboratory) are listed below for reference.   Imaging Studies: DG Humerus Left  Result Date: 03/17/2022 CLINICAL DATA:  Postoperative pain; history of multiple myeloma EXAM: LEFT HUMERUS - 2+ VIEW COMPARISON:  02/22/2022 FINDINGS: IM nail with multiple proximal and single distal locking screws across an oblique slightly displaced fracture of the proximal LEFT humeral diaphysis. Degree of displacement at the fracture has slightly increased from the previous exam Endosteal scalloping at the level of the fracture consistent with pathologic fracture. Glenohumeral and elbow joint alignments normal. IMPRESSION: Post nailing of LEFT humerus with slightly increased displacement at pathologic fracture of the proximal LEFT humerus. Electronically Signed   By: Lavonia Dana M.D.   On: 03/17/2022 13:39   US RENAL  Result Date: 03/11/2022 CLINICAL DATA:  Acute kidney injury EXAM: RENAL / URINARY TRACT ULTRASOUND COMPLETE COMPARISON:  No prior dedicated renal ultrasound available FINDINGS: Right Kidney: Renal measurements: 11.9 x 5.7 x 5.6 cm = volume: 197 mL. Echogenicity within normal limits. No mass or hydronephrosis visualized. Left Kidney: Renal measurements: 11.8 x 5.8 x 5.0 cm = volume: 176 mL. Echogenicity within normal limits. No mass or hydronephrosis visualized. Bladder: Appears normal for degree of bladder distention. No bladder jets visualized. Other: None. IMPRESSION: No significant sonographic abnormality of the kidneys. Electronically Signed   By: Merilyn Baba M.D.   On: 03/11/2022 18:28   MR Lumbar Spine W Wo Contrast  Result Date: 03/07/2022 CLINICAL DATA:  Mid back pain with suspected epidural abscess. EXAM: MRI THORACIC AND LUMBAR SPINE WITHOUT AND WITH CONTRAST TECHNIQUE: Multiplanar and multiecho pulse sequences of the thoracic and lumbar spine were obtained without and with intravenous contrast. CONTRAST:  19mL GADAVIST GADOBUTROL 1 MMOL/ML IV SOLN  COMPARISON:  Lumbar spine MRI 01/04/2022 FINDINGS: MRI THORACIC SPINE FINDINGS Alignment:  Physiologic. Vertebrae: Diffuse bone marrow heterogeneity.  No focal lesion. Cord: Small focus of hyperintense T2-weighted signal in the spinal cord at T2-3, T5-6 and T6-7. Paraspinal and other soft tissues: Negative. Disc levels: Multilevel degenerative disc disease without spinal canal stenosis or neural impingement. MRI LUMBAR SPINE FINDINGS Segmentation:  Standard. Alignment:  Physiologic. Vertebrae: Diffuse bone marrow signal heterogeneity. Since 01/04/2022, there been compression fractures at L1, L2 and L3, now status post augmentation no acute lesion. Conus medullaris: Extends to the L1 level and appears normal.  Paraspinal and other soft tissues: Distended urinary bladder Disc levels: L1-L2: Normal disc space and facet joints. No spinal canal stenosis. No neural foraminal stenosis. L2-L3: Normal disc space and facet joints. No spinal canal stenosis. No neural foraminal stenosis. L3-L4: Normal disc space and facet joints. No spinal canal stenosis. No neural foraminal stenosis. L4-L5: Small disc bulge with facet hypertrophy. No spinal canal stenosis. Unchanged moderate left neural foraminal stenosis. L5-S1: Normal disc space and mild facet arthrosis. No spinal canal stenosis. No neural foraminal stenosis. Visualized sacrum: Normal. IMPRESSION: 1. No epidural abscess or other acute abnormality of the thoracic or lumbar spine. 2. Diffuse bone marrow heterogeneity consistent with ported diagnosis of multiple myeloma. 3. Small focus of hyperintense T2-weighted signal in the spinal cord at T2-3, T5-6 and T6-7. 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 4. Chronic compression fractures at L1, L2 and L3, now status post augmentation. 5. Unchanged moderate left L4-5 neural foraminal stenosis. 6. Distended urinary bladder. Correlate for urinary retention. Electronically Signed   By: Ulyses Jarred M.D.   On: 03/07/2022  19:30   MR THORACIC SPINE W WO CONTRAST  Result Date: 03/07/2022 CLINICAL DATA:  Mid back pain with suspected epidural abscess. EXAM: MRI THORACIC AND LUMBAR SPINE WITHOUT AND WITH CONTRAST TECHNIQUE: Multiplanar and multiecho pulse sequences of the thoracic and lumbar spine were obtained without and with intravenous contrast. CONTRAST:  14mL GADAVIST GADOBUTROL 1 MMOL/ML IV SOLN COMPARISON:  Lumbar spine MRI 01/04/2022 FINDINGS: MRI THORACIC SPINE FINDINGS Alignment:  Physiologic. Vertebrae: Diffuse bone marrow heterogeneity.  No focal lesion. Cord: Small focus of hyperintense T2-weighted signal in the spinal cord at T2-3, T5-6 and T6-7. Paraspinal and other soft tissues: Negative. Disc levels: Multilevel degenerative disc disease without spinal canal stenosis or neural impingement. MRI LUMBAR SPINE FINDINGS Segmentation:  Standard. Alignment:  Physiologic. Vertebrae: Diffuse bone marrow signal heterogeneity. Since 01/04/2022, there been compression fractures at L1, L2 and L3, now status post augmentation no acute lesion. Conus medullaris: Extends to the L1 level and appears normal. Paraspinal and other soft tissues: Distended urinary bladder Disc levels: L1-L2: Normal disc space and facet joints. No spinal canal stenosis. No neural foraminal stenosis. L2-L3: Normal disc space and facet joints. No spinal canal stenosis. No neural foraminal stenosis. L3-L4: Normal disc space and facet joints. No spinal canal stenosis. No neural foraminal stenosis. L4-L5: Small disc bulge with facet hypertrophy. No spinal canal stenosis. Unchanged moderate left neural foraminal stenosis. L5-S1: Normal disc space and mild facet arthrosis. No spinal canal stenosis. No neural foraminal stenosis. Visualized sacrum: Normal. IMPRESSION: 1. No epidural abscess or other acute abnormality of the thoracic or lumbar spine. 2. Diffuse bone marrow heterogeneity consistent with ported diagnosis of multiple myeloma. 3. Small focus of  hyperintense T2-weighted signal in the spinal cord at T2-3, T5-6 and T6-7. 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 4. Chronic compression fractures at L1, L2 and L3, now status post augmentation. 5. Unchanged moderate left L4-5 neural foraminal stenosis. 6. Distended urinary bladder. Correlate for urinary retention. Electronically Signed   By: Ulyses Jarred M.D.   On: 03/07/2022 19:30   IR KYPHO LUMBAR INC FX REDUCE BONE BX UNI/BIL CANNULATION INC/IMAGING  Result Date: 02/24/2022 INDICATION: 51 year old male with known multiple myeloma and acute symptomatic pathologic fractures of the L1 and L2 vertebral bodies. EXAM: FLUOROSCOPY GUIDED L1 AND L2 RADIOFREQUENCY ABLATION AND BALLOON KYPHOPLASTY COMPARISON:  CT lumbar spine February 17, 2022. MEDICATIONS: As antibiotic prophylaxis, Ancef 2 g IV was ordered pre-procedure and administered  intravenously within 1 hour of incision. All current medications are in the EMR and have been reviewed as part of this encounter. ANESTHESIA/SEDATION: Moderate (conscious) sedation was employed during this procedure. A total of Versed 6 mg, Dilaudid 1 mg and Fentanyl 200 mcg were administered intravenously by the radiology nurse. Total intra-service moderate Sedation Time: 1 hour and 47 minutes. The patient's level of consciousness and vital signs were monitored continuously by radiology nursing throughout the procedure under my direct supervision. FLUOROSCOPY: Radiation Exposure Index (as provided by the fluoroscopic device): 1,155 mGy Kerma COMPLICATIONS: None immediate. PROCEDURE: Following a full explanation of the procedure along with the potential associated complications, an informed witnessed consent was obtained. The patient was placed in prone position on the angiography table. The lumbar region was prepped and draped in a sterile fashion. Under fluoroscopy, the L1 vertebral body was delineated and the skin area was marked. The skin was infiltrated with a  1% Lidocaine approximately 3.5 cm lateral to the spinous process projection on the right. Using a 22-gauge spinal needle, the soft issue and the peripedicular space and periosteum were infiltrated with Bupivacaine 0.5%. A skin incision was made at the access site. Subsequently, an 11-gauge Kyphon trocar was inserted under fluoroscopic guidance until contact with the pedicle was obtained. The trocar was inserted into the pedicle with light hammer tapping until the posterior boundaries of the vertebral body was reached. The diamond mandrill was removed and one core biopsy was obtained. The skin was infiltrated with a 1% Lidocaine approximately 3.5 cm lateral to the spinous process projection on the left. Using a 22-gauge spinal needle, the soft issue and the peripedicular space and periosteum were infiltrated with Bupivacaine 0.5%. A skin incision was made at the access site. Subsequently, an 11-gauge Kyphon trocar was inserted under fluoroscopic guidance until contact with the pedicle was obtained. The trocar was inserted with light hammer tapping into the pedicle until the posterior boundaries of the vertebral body was reached. A bone drill was coaxially advanced within the anterior third of the vertebral body on each side for proper RF probes size selection. An OsteoCool RF probe was inserted bilaterally within the mid-posterior third of the vertebral body. The radiofrequency ablation cycle was performed. Thereafter, the RF probes were exchanged for inflatable Kyphon balloons, which were centered within the mid-aspect of the vertebral body. The balloons were inflated to create a void to serve as a repository for the bone cement. Both balloons were deflated and through both cannulas, under continuous fluoroscopy guidance in the AP and lateral views, the vertebral body was filled with previously mixed polymethyl-methacrylate (PMMA) added to barium for opacification. Both cannulas were later removed. Under  fluoroscopy, the L2 vertebral body was delineated and the skin area was marked. The skin was infiltrated with a 1% Lidocaine approximately 4 cm lateral to the spinous process projection on the right. Using a 22-gauge spinal needle, the soft issue and the peripedicular space and periosteum were infiltrated with Bupivacaine 0.5%. A skin incision was made at the access site. Subsequently, an 11-gauge Kyphon trocar was inserted under fluoroscopic guidance until contact with the pedicle was obtained. The trocar was inserted into the pedicle with light hammer tapping until the posterior boundaries of the vertebral body was reached. The diamond mandrill was removed. The skin was infiltrated with a 1% Lidocaine approximately 4 cm lateral to the spinous process projection on the left. Using a 22-gauge spinal needle, the soft issue and the peripedicular space and periosteum were infiltrated with  Bupivacaine 0.5%. A skin incision was made at the access site. Subsequently, an 11-gauge Kyphon trocar was inserted under fluoroscopic guidance until contact with the pedicle was obtained. The trocar was inserted with light hammer tapping into the pedicle until the posterior boundaries of the vertebral body was reached. A bone drill was coaxially advanced within the anterior third of the vertebral body on each side for proper RF probes size selection. An OsteoCool RF probe was inserted bilaterally within the mid-posterior third of the vertebral body. The radiofrequency ablation cycle was performed. Thereafter, the RF probes were exchanged for inflatable Kyphon balloons, which were centered within the mid-aspect of the vertebral body. The balloons were inflated to create a void to serve as a repository for the bone cement. Both balloons were deflated and through both cannulas, under continuous fluoroscopy guidance in the AP and lateral views, the vertebral body was filled with previously mixed polymethyl-methacrylate (PMMA) added to  barium for opacification. Both cannulas were later removed. Postprocedural radiograph showed adequate cement distribution within the corresponding vertebral bodies without evidence of significant leakage or embolism. IMPRESSION: Successful fluoroscopic-guided L1 core biopsy and L1 and L2 radiofrequency ablation followed by kyphoplasty for treatment of symptomatic acute pathologic fractures. Biopsy sample obtained was sent for tissue exam. Electronically Signed   By: Pedro Earls M.D.   On: 02/24/2022 13:12   IR KYPHO EA ADDL LEVEL THORACIC OR LUMBAR  Result Date: 02/24/2022 INDICATION: 51 year old male with known multiple myeloma and acute symptomatic pathologic fractures of the L1 and L2 vertebral bodies. EXAM: FLUOROSCOPY GUIDED L1 AND L2 RADIOFREQUENCY ABLATION AND BALLOON KYPHOPLASTY COMPARISON:  CT lumbar spine February 17, 2022. MEDICATIONS: As antibiotic prophylaxis, Ancef 2 g IV was ordered pre-procedure and administered intravenously within 1 hour of incision. All current medications are in the EMR and have been reviewed as part of this encounter. ANESTHESIA/SEDATION: Moderate (conscious) sedation was employed during this procedure. A total of Versed 6 mg, Dilaudid 1 mg and Fentanyl 200 mcg were administered intravenously by the radiology nurse. Total intra-service moderate Sedation Time: 1 hour and 47 minutes. The patient's level of consciousness and vital signs were monitored continuously by radiology nursing throughout the procedure under my direct supervision. FLUOROSCOPY: Radiation Exposure Index (as provided by the fluoroscopic device): 3,810 mGy Kerma COMPLICATIONS: None immediate. PROCEDURE: Following a full explanation of the procedure along with the potential associated complications, an informed witnessed consent was obtained. The patient was placed in prone position on the angiography table. The lumbar region was prepped and draped in a sterile fashion. Under fluoroscopy,  the L1 vertebral body was delineated and the skin area was marked. The skin was infiltrated with a 1% Lidocaine approximately 3.5 cm lateral to the spinous process projection on the right. Using a 22-gauge spinal needle, the soft issue and the peripedicular space and periosteum were infiltrated with Bupivacaine 0.5%. A skin incision was made at the access site. Subsequently, an 11-gauge Kyphon trocar was inserted under fluoroscopic guidance until contact with the pedicle was obtained. The trocar was inserted into the pedicle with light hammer tapping until the posterior boundaries of the vertebral body was reached. The diamond mandrill was removed and one core biopsy was obtained. The skin was infiltrated with a 1% Lidocaine approximately 3.5 cm lateral to the spinous process projection on the left. Using a 22-gauge spinal needle, the soft issue and the peripedicular space and periosteum were infiltrated with Bupivacaine 0.5%. A skin incision was made at the access site. Subsequently, an  11-gauge Kyphon trocar was inserted under fluoroscopic guidance until contact with the pedicle was obtained. The trocar was inserted with light hammer tapping into the pedicle until the posterior boundaries of the vertebral body was reached. A bone drill was coaxially advanced within the anterior third of the vertebral body on each side for proper RF probes size selection. An OsteoCool RF probe was inserted bilaterally within the mid-posterior third of the vertebral body. The radiofrequency ablation cycle was performed. Thereafter, the RF probes were exchanged for inflatable Kyphon balloons, which were centered within the mid-aspect of the vertebral body. The balloons were inflated to create a void to serve as a repository for the bone cement. Both balloons were deflated and through both cannulas, under continuous fluoroscopy guidance in the AP and lateral views, the vertebral body was filled with previously mixed  polymethyl-methacrylate (PMMA) added to barium for opacification. Both cannulas were later removed. Under fluoroscopy, the L2 vertebral body was delineated and the skin area was marked. The skin was infiltrated with a 1% Lidocaine approximately 4 cm lateral to the spinous process projection on the right. Using a 22-gauge spinal needle, the soft issue and the peripedicular space and periosteum were infiltrated with Bupivacaine 0.5%. A skin incision was made at the access site. Subsequently, an 11-gauge Kyphon trocar was inserted under fluoroscopic guidance until contact with the pedicle was obtained. The trocar was inserted into the pedicle with light hammer tapping until the posterior boundaries of the vertebral body was reached. The diamond mandrill was removed. The skin was infiltrated with a 1% Lidocaine approximately 4 cm lateral to the spinous process projection on the left. Using a 22-gauge spinal needle, the soft issue and the peripedicular space and periosteum were infiltrated with Bupivacaine 0.5%. A skin incision was made at the access site. Subsequently, an 11-gauge Kyphon trocar was inserted under fluoroscopic guidance until contact with the pedicle was obtained. The trocar was inserted with light hammer tapping into the pedicle until the posterior boundaries of the vertebral body was reached. A bone drill was coaxially advanced within the anterior third of the vertebral body on each side for proper RF probes size selection. An OsteoCool RF probe was inserted bilaterally within the mid-posterior third of the vertebral body. The radiofrequency ablation cycle was performed. Thereafter, the RF probes were exchanged for inflatable Kyphon balloons, which were centered within the mid-aspect of the vertebral body. The balloons were inflated to create a void to serve as a repository for the bone cement. Both balloons were deflated and through both cannulas, under continuous fluoroscopy guidance in the AP and  lateral views, the vertebral body was filled with previously mixed polymethyl-methacrylate (PMMA) added to barium for opacification. Both cannulas were later removed. Postprocedural radiograph showed adequate cement distribution within the corresponding vertebral bodies without evidence of significant leakage or embolism. IMPRESSION: Successful fluoroscopic-guided L1 core biopsy and L1 and L2 radiofrequency ablation followed by kyphoplasty for treatment of symptomatic acute pathologic fractures. Biopsy sample obtained was sent for tissue exam. Electronically Signed   By: Pedro Earls M.D.   On: 02/24/2022 13:12   IR Bone Tumor(s)RF Ablation  Result Date: 02/24/2022 INDICATION: 51 year old male with known multiple myeloma and acute symptomatic pathologic fractures of the L1 and L2 vertebral bodies. EXAM: FLUOROSCOPY GUIDED L1 AND L2 RADIOFREQUENCY ABLATION AND BALLOON KYPHOPLASTY COMPARISON:  CT lumbar spine February 17, 2022. MEDICATIONS: As antibiotic prophylaxis, Ancef 2 g IV was ordered pre-procedure and administered intravenously within 1 hour of incision. All current  medications are in the EMR and have been reviewed as part of this encounter. ANESTHESIA/SEDATION: Moderate (conscious) sedation was employed during this procedure. A total of Versed 6 mg, Dilaudid 1 mg and Fentanyl 200 mcg were administered intravenously by the radiology nurse. Total intra-service moderate Sedation Time: 1 hour and 47 minutes. The patient's level of consciousness and vital signs were monitored continuously by radiology nursing throughout the procedure under my direct supervision. FLUOROSCOPY: Radiation Exposure Index (as provided by the fluoroscopic device): 6,203 mGy Kerma COMPLICATIONS: None immediate. PROCEDURE: Following a full explanation of the procedure along with the potential associated complications, an informed witnessed consent was obtained. The patient was placed in prone position on the  angiography table. The lumbar region was prepped and draped in a sterile fashion. Under fluoroscopy, the L1 vertebral body was delineated and the skin area was marked. The skin was infiltrated with a 1% Lidocaine approximately 3.5 cm lateral to the spinous process projection on the right. Using a 22-gauge spinal needle, the soft issue and the peripedicular space and periosteum were infiltrated with Bupivacaine 0.5%. A skin incision was made at the access site. Subsequently, an 11-gauge Kyphon trocar was inserted under fluoroscopic guidance until contact with the pedicle was obtained. The trocar was inserted into the pedicle with light hammer tapping until the posterior boundaries of the vertebral body was reached. The diamond mandrill was removed and one core biopsy was obtained. The skin was infiltrated with a 1% Lidocaine approximately 3.5 cm lateral to the spinous process projection on the left. Using a 22-gauge spinal needle, the soft issue and the peripedicular space and periosteum were infiltrated with Bupivacaine 0.5%. A skin incision was made at the access site. Subsequently, an 11-gauge Kyphon trocar was inserted under fluoroscopic guidance until contact with the pedicle was obtained. The trocar was inserted with light hammer tapping into the pedicle until the posterior boundaries of the vertebral body was reached. A bone drill was coaxially advanced within the anterior third of the vertebral body on each side for proper RF probes size selection. An OsteoCool RF probe was inserted bilaterally within the mid-posterior third of the vertebral body. The radiofrequency ablation cycle was performed. Thereafter, the RF probes were exchanged for inflatable Kyphon balloons, which were centered within the mid-aspect of the vertebral body. The balloons were inflated to create a void to serve as a repository for the bone cement. Both balloons were deflated and through both cannulas, under continuous fluoroscopy  guidance in the AP and lateral views, the vertebral body was filled with previously mixed polymethyl-methacrylate (PMMA) added to barium for opacification. Both cannulas were later removed. Under fluoroscopy, the L2 vertebral body was delineated and the skin area was marked. The skin was infiltrated with a 1% Lidocaine approximately 4 cm lateral to the spinous process projection on the right. Using a 22-gauge spinal needle, the soft issue and the peripedicular space and periosteum were infiltrated with Bupivacaine 0.5%. A skin incision was made at the access site. Subsequently, an 11-gauge Kyphon trocar was inserted under fluoroscopic guidance until contact with the pedicle was obtained. The trocar was inserted into the pedicle with light hammer tapping until the posterior boundaries of the vertebral body was reached. The diamond mandrill was removed. The skin was infiltrated with a 1% Lidocaine approximately 4 cm lateral to the spinous process projection on the left. Using a 22-gauge spinal needle, the soft issue and the peripedicular space and periosteum were infiltrated with Bupivacaine 0.5%. A skin incision was made at  the access site. Subsequently, an 11-gauge Kyphon trocar was inserted under fluoroscopic guidance until contact with the pedicle was obtained. The trocar was inserted with light hammer tapping into the pedicle until the posterior boundaries of the vertebral body was reached. A bone drill was coaxially advanced within the anterior third of the vertebral body on each side for proper RF probes size selection. An OsteoCool RF probe was inserted bilaterally within the mid-posterior third of the vertebral body. The radiofrequency ablation cycle was performed. Thereafter, the RF probes were exchanged for inflatable Kyphon balloons, which were centered within the mid-aspect of the vertebral body. The balloons were inflated to create a void to serve as a repository for the bone cement. Both balloons were  deflated and through both cannulas, under continuous fluoroscopy guidance in the AP and lateral views, the vertebral body was filled with previously mixed polymethyl-methacrylate (PMMA) added to barium for opacification. Both cannulas were later removed. Postprocedural radiograph showed adequate cement distribution within the corresponding vertebral bodies without evidence of significant leakage or embolism. IMPRESSION: Successful fluoroscopic-guided L1 core biopsy and L1 and L2 radiofrequency ablation followed by kyphoplasty for treatment of symptomatic acute pathologic fractures. Biopsy sample obtained was sent for tissue exam. Electronically Signed   By: Pedro Earls M.D.   On: 02/24/2022 13:12   DG Humerus Left  Result Date: 02/22/2022 CLINICAL DATA:  Left humerus intramedullary nail. Intraoperative fluoroscopy. EXAM: LEFT HUMERUS - 2+ VIEW COMPARISON:  Left shoulder radiographs 02/17/2022 and 02/04/2022 FINDINGS: Images were performed intraoperatively without the presence of a radiologist. New intramedullary nail fixation of the previously seen pathologic fracture of the proximal humeral diaphysis. No hardware complication is seen. Total fluoroscopy images: 6 Total fluoroscopy time: 84 seconds Total dose: Radiation Exposure Index (as provided by the fluoroscopic device): 8.27 mGy air Kerma Please see intraoperative findings for further detail. IMPRESSION: Intraoperative fluoroscopy provided for intramedullary nail fixation of the known proximal humeral diaphysis pathologic fracture. Electronically Signed   By: Yvonne Kendall M.D.   On: 02/22/2022 13:38   DG C-Arm 1-60 Min-No Report  Result Date: 02/22/2022 Fluoroscopy was utilized by the requesting physician.  No radiographic interpretation.   DG C-Arm 1-60 Min-No Report  Result Date: 02/22/2022 Fluoroscopy was utilized by the requesting physician.  No radiographic interpretation.   DG Chest Port 1 View  Result Date:  02/20/2022 CLINICAL DATA:  Shortness of breath.  Fracture of left shoulder. EXAM: PORTABLE CHEST 1 VIEW COMPARISON:  02/17/2022 FINDINGS: Heart size and mediastinal contours are stable. Lungs are hypoinflated. Pulmonary vascular congestion. No pleural effusion or edema. No airspace disease. Pathologic fracture involving the proximal left humerus is noted. IMPRESSION: 1. Pulmonary vascular congestion. 2. Pathologic fracture involving the proximal left humerus. Electronically Signed   By: Kerby Moors M.D.   On: 02/20/2022 08:07   DG Shoulder Left  Result Date: 02/17/2022 CLINICAL DATA:  Fall EXAM: LEFT SHOULDER - 3 VIEW COMPARISON:  Shoulder radiograph dated February 04, 2022 FINDINGS: Oblique displaced fracture of the proximal left humeral shaft. Associated lucent lesion of the proximal left humerus is again seen. Associated soft tissue edema of the proximal left arm. Visualized lung is clear. IMPRESSION: Oblique displaced fracture of the proximal left humeral shaft with associated lucent lesion, findings are concerning for pathologic compression fracture. Electronically Signed   By: Yetta Glassman M.D.   On: 02/17/2022 13:13   DG Pelvis Portable  Result Date: 02/17/2022 CLINICAL DATA:  Fall. EXAM: PORTABLE PELVIS 1-2 VIEWS COMPARISON:  CT abdomen  and pelvis 07/21/2021 FINDINGS: No acute fracture or pelvic diastasis is identified. There is mild left hip joint space narrowing. IMPRESSION: No acute osseous abnormality identified. Electronically Signed   By: Logan Bores M.D.   On: 02/17/2022 13:09   DG Chest Portable 1 View  Result Date: 02/17/2022 CLINICAL DATA:  Fall. EXAM: PORTABLE CHEST 1 VIEW COMPARISON:  Chest radiographs 05/04/2019 FINDINGS: The cardiomediastinal silhouette is unchanged with normal heart size. Lung volumes are low with bronchovascular crowding. No confluent airspace opacity, overt pulmonary edema, sizable pleural effusion, or pneumothorax is identified. No acute osseous  abnormality is seen. IMPRESSION: No active disease. Electronically Signed   By: Logan Bores M.D.   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 Low back pain. Status post recent L3 vertebral body biopsy, RF ablation, augmentation on 02/05/2022. Pathology revealed plasmacytoma. EXAM: CT LUMBAR SPINE WITHOUT CONTRAST TECHNIQUE: Multidetector CT imaging of the lumbar spine was performed without intravenous contrast administration. Multiplanar CT image reconstructions were also generated. RADIATION DOSE REDUCTION: This exam was performed according to the departmental dose-optimization program which includes automated exposure control, adjustment of the mA and/or kV according to patient size and/or use of iterative reconstruction technique. COMPARISON:  Lumbar MRI 01/07/2022. CT Abdomen and Pelvis 07/21/2021. FINDINGS: Segmentation: Normal. Alignment: Stable lumbar lordosis. Vertebrae: Pronounced heterogeneity of bone mineralization since February this year. Diffuse lytic vertebral and visible pelvic bone lesions. Mild pathologic compression fracture of L1. More moderate pathologic compression fracture of L2, with lucency of the anterior superior endplate and 68% central loss of vertebral body height compared to February. No retropulsion of bone. L2 pedicles and posterior elements appear intact. Augmented moderate to severe L3 pathologic compression fracture. No significant L4 vertebral compression. Subtle L5 pathologic vertebral compression. Visible sacral ala appear intact despite myelomas involvement. Paraspinal and other soft tissues: Negative visible noncontrast abdominal viscera. Lumbar paraspinal soft tissues remain within normal limits. Disc levels: Lumbar spine degeneration predominantly moderate to severe facet arthropathy at L4-L5 where there is subtle chronic grade 1 anterolisthesis. IMPRESSION:  1. Diffuse Multiple Myeloma involvement of the visible spine and pelvis, new since a February CT. 2. Augmented compression fracture of L3. Mild-to-moderate pathologic compression fracture of L2 (22% loss of vertebral body height, no complicating features). Mild pathologic compression fracture of L1. Electronically Signed   By: Genevie Ann M.D.   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 Low back pain. Status post recent L3 vertebral body biopsy, RF ablation, augmentation on 02/05/2022. Pathology revealed plasmacytoma. Neck pain. EXAM: CT CERVICAL SPINE WITHOUT CONTRAST TECHNIQUE: Multidetector CT imaging of the cervical spine was performed without intravenous contrast. Multiplanar CT image reconstructions were also generated. RADIATION DOSE REDUCTION: This exam was performed according to the departmental dose-optimization program which includes automated exposure control, adjustment of the mA and/or kV according to patient size and/or use of iterative reconstruction technique. COMPARISON:  Cervical spine radiographs 12/18/2021. FINDINGS: Alignment: Straightening of cervical lordosis. Cervicothoracic junction alignment is within normal limits. Bilateral posterior element alignment is within normal limits. Skull base and vertebrae: Visualized skull base is intact. No atlanto-occipital dissociation. C1 and C2 appear intact and aligned. Widely scattered ill-defined lucent lesions are present elsewhere in the cervical spine and visible upper thoracic spine C3 through T2. No cervical pathologic fracture is identified. Posterior element involvement is noted on the  right at C4 (series 4, image 44). No expansile cervical lesion at this time. Soft tissues and spinal canal: No prevertebral fluid or swelling. No visible canal hematoma. Negative noncontrast visible neck soft tissues. Disc levels: Widespread  superimposed cervical spine disc and endplate degeneration. Multilevel degenerative cervical spinal stenosis, mild to moderate at C5-C6. Upper chest: T1 and T2 vertebral myeloma thus involvement, including an indistinct 10 mm lucent lesion of the left T1 body. Lung apices are clear. Negative visible noncontrast superior mediastinum. Other: Negative visible noncontrast posterior fossa. Visible paranasal sinuses, tympanic cavities and mastoids are well aerated. IMPRESSION: 1. Widespread cervical and visible upper thoracic vertebral involvement by Multiple Myeloma. No cervical pathologic fracture. 2. Superimposed bulky cervical spine degeneration. Subsequent mild to moderate degenerative spinal stenosis suspected at C5-C6. Electronically Signed   By: Genevie Ann M.D.   On: 02/17/2022 09:41    Microbiology: Results for orders placed or performed during the hospital encounter of 02/17/22  Urine Culture     Status: Abnormal   Collection Time: 02/20/22  6:12 AM   Specimen: Urine, Clean Catch  Result Value Ref Range Status   Specimen Description URINE, CLEAN CATCH  Final   Special Requests   Final    NONE Performed at [facility], [place] [address] [place], [place] [address]    Culture MULTIPLE SPECIES PRESENT, SUGGEST RECOLLECTION (A)  Final   Report Status 02/21/2022 FINAL  Final    Labs: CBC: Recent Labs  Lab 03/11/22 0322 03/12/22 0329 03/13/22 0328 03/15/22 0340 03/17/22 0337  WBC 6.9 4.9 3.7* 4.7 5.1  NEUTROABS  --   --  1.9 2.5 2.3  HGB 9.1* 8.7* 8.9* 10.5* 10.5*  HCT 29.5* 27.5* 27.9* 33.1* 32.8*  MCV 93.1 93.2 92.7 92.2 91.4  PLT 166 145* 152 163 798   Basic Metabolic Panel: Recent Labs  Lab 03/12/22 0329 03/13/22 0328 03/14/22 0324 03/15/22 0340 03/17/22 0337  NA 133* 132* 135 131* 130*  K 4.6 4.1 4.7 4.5 4.7  CL 105 100 101 99 96*  CO2 $Re'23 26 28 24 27  'Vxb$ GLUCOSE 99 104* 114* 110* 118*  BUN 38* 24* 24* 24* 27*  CREATININE 1.13 0.98 1.03 1.05 1.04  CALCIUM 9.9 9.4 10.0  9.6 9.7  MG  --  1.8  --   --  2.3  PHOS 3.4  --   --   --   --    Liver Function Tests: Recent Labs  Lab 03/12/22 0329 03/13/22 0328 03/14/22 0324 03/15/22 0340 03/17/22 0337  AST  --  50* 36 34 30  ALT  --  66* 78* 86* 104*  ALKPHOS  --  60 65 68 78  BILITOT  --  0.6 0.6 0.6 0.8  PROT  --  8.7* 9.6* 9.3* 9.4*  ALBUMIN 2.9* 2.8* 3.2* 3.1* 3.2*   CBG: Recent Labs  Lab 03/16/22 1144 03/16/22 1615 03/16/22 2138 03/17/22 0759 03/17/22 1238  GLUCAP 112* 113* 133* 91 102*    Discharge time spent: greater than 30 minutes.  Signed: Vernelle Emerald, MD [facility] 03/17/2022

## 2022-03-17 NOTE — Telephone Encounter (Signed)
Patient was called to come in  for appt today '@1330'$  due to a provider meeting at 1300.

## 2022-03-17 NOTE — Plan of Care (Signed)
  Problem: Pain Managment: Goal: General experience of comfort will improve 03/17/2022 0118 by Blase Mess, RN Outcome: Progressing 03/17/2022 0118 by Blase Mess, RN Outcome: Progressing

## 2022-03-17 NOTE — Progress Notes (Signed)
Verbal order given on dayshift to remove surgical dressing from left arm and replace with mepilex foam dressing if needed. Patient does not want the dressing removed until Dr. Stann Mainland sees him.  Dr. Alvan Dame to notify Dr. Stann Mainland that patient is here in room 1328 and ask that he come by to see him.

## 2022-03-17 NOTE — Discharge Instructions (Signed)
Please ensure patient is participating in physical therapy as directed Patient is to follow-up with hematology/oncology with Dr. Lorenso Courier in 1 week.  Please ensure patient has a follow-up appointment

## 2022-03-17 NOTE — Progress Notes (Signed)
Patient evaluated, well healed incisions without signs of infection. NV intact. Pain improved compared to pre-op. Recommend staple removal and routine post op left humerus xray. Okay for all ROM and WBAT with the left upper extremity.  Follow up in the office in 4 weeks. Ok for discharge from orthopedic perspective after xray and staple removal.

## 2022-03-17 NOTE — TOC Transition Note (Signed)
Transition of Care [facility]) - CM/SW Discharge Note   Patient Details  Name: [name] MRN: [id number] Date of Birth: 1971-02-19  Transition of Care [facility]) CM/SW Contact:  Lennart Pall, LCSW Phone Number: 03/17/2022, 2:14 PM   Clinical Narrative:    Have received insurance authorization for SNF today and pt medically cleared to dc to [facility].  Pt and family aware and agreeable.  PTAR called at 2:10pm.  RN to call report to [phone].  No further TOC needs.   Final next level of care: Skilled Nursing Facility Barriers to Discharge: Barriers Resolved   Patient Goals and CMS Choice Patient states their goals for this hospitalization and ongoing recovery are:: control of pain      Discharge Placement              Patient chooses bed at: [facility] Patient to be transferred to facility by: [place] Name of family member notified: pt has told hs sister Patient and family notified of of transfer: 03/17/22  Discharge Plan and Services In-house Referral: Clinical Social Work              DME Arranged: N/A DME Agency: NA                  Social Determinants of Health (SDOH) Interventions     Readmission Risk Interventions    02/19/2022    3:30 PM  Readmission Risk Prevention Plan  Post Dischage Appt Complete  Medication Screening Complete  Transportation Screening Complete

## 2022-03-17 NOTE — Progress Notes (Signed)
Physical Therapy Treatment Patient Details Name: JOHNTE PORTNOY MRN: [id number] DOB: November 22, 1970 Today's Date: 03/17/2022   History of Present Illness TANOR GLASPY is a 51 y.o. male with medical history significant for IgG kappa multiple myeloma on active treatment with Velcade/Revlimid, pathologic left humeral fracture (s/p IM nail 02/22/2022) and L1, L2, L3 compression fractures s/p radiofrequency ablation and kyphoplasty (L1, L2 on 02/19/2022 and L3 on 02/05/2022), HTN, anemia of chronic disease who presented to the ED for evaluation of worsening lower back pain.  Patient was recently hospitalized 02/17/2022-02/24/2022 for pathologic proximal left humeral fracture and lumbar L1-2 compression fractures.  He underwent IR kyphoplasty on 9/27 in the left humeral shaft medullary nailing 9/30.    PT Comments    Progressing with mobility. Moderate pain at rest and with activity. Pt is still fearful of "overdoing" it and pain getting worse. He was able to don TLSO with Min A on today. He reports that he lives alone and will not have any help available at home. Continue to recommend SNF.     Recommendations for follow up therapy are one component of a multi-disciplinary discharge planning process, led by the attending physician.  Recommendations may be updated based on patient status, additional functional criteria and insurance authorization.  Follow Up Recommendations  Skilled nursing-short term rehab (<3 hours/day) Can patient physically be transported by private vehicle: Yes   Assistance Recommended at Discharge Frequent or constant Supervision/Assistance  Patient can return home with the following A little help with walking and/or transfers;A little help with bathing/dressing/bathroom;Assistance with cooking/housework;Help with stairs or ramp for entrance   Equipment Recommendations  Rolling walker (2 wheels)    Recommendations for Other Services       Precautions / Restrictions  Precautions Precautions: Fall;Back Required Braces or Orthoses: Spinal Brace Spinal Brace: Thoracolumbosacral orthotic;Applied in standing position Restrictions Weight Bearing Restrictions: No LUE Weight Bearing: Weight bearing as tolerated Other Position/Activity Restrictions: per MD op note on 10/1. pt okay to be out of LUE sling as tolerated, WBAT     Mobility  Bed Mobility Overal bed mobility: Needs Assistance Bed Mobility: Supine to Sit, Sit to Supine     Supine to sit: Min guard, HOB elevated   Sit to supine: Min guard, HOB elevated General bed mobility comments: Increased time. Pt tends to rely on mechanics of bed for ease of movement    Transfers Overall transfer level: Needs assistance Equipment used: Rolling walker (2 wheels) Transfers: Sit to/from Stand Sit to Stand: Min guard   Step pivot transfers: From elevated surface       General transfer comment: Increased time from highly elevated bed.    Ambulation/Gait Ambulation/Gait assistance: Min guard Gait Distance (Feet): 30 Feet Assistive device: Rolling walker (2 wheels) Gait Pattern/deviations: Step-through pattern, Decreased stride length       General Gait Details: Min guard for safety. Slow but steady gait with use of RW. He denied dizziness. Distance limited by pain and pt fear that he may overdo it.   Stairs             Wheelchair Mobility    Modified Rankin (Stroke Patients Only)       Balance Overall balance assessment: Needs assistance         Standing balance support: Bilateral upper extremity supported, During functional activity, Reliant on assistive device for balance Standing balance-Leahy Scale: Fair  Cognition Arousal/Alertness: Awake/alert Behavior During Therapy: WFL for tasks assessed/performed Overall Cognitive Status: Within Functional Limits for tasks assessed                                           Exercises      General Comments        Pertinent Vitals/Pain Pain Assessment Pain Assessment: 0-10 Pain Score: 5  Pain Location: Back Pain Descriptors / Indicators: Sharp, Shooting Pain Intervention(s): Limited activity within patient's tolerance, Monitored during session, Repositioned    Home Living                          Prior Function            PT Goals (current goals can now be found in the care plan section) Progress towards PT goals: Progressing toward goals    Frequency           PT Plan Current plan remains appropriate    Co-evaluation              AM-PAC PT "6 Clicks" Mobility   Outcome Measure  Help needed turning from your back to your side while in a flat bed without using bedrails?: None Help needed moving from lying on your back to sitting on the side of a flat bed without using bedrails?: A Little Help needed moving to and from a bed to a chair (including a wheelchair)?: A Little Help needed standing up from a chair using your arms (e.g., wheelchair or bedside chair)?: A Little Help needed to walk in hospital room?: A Little Help needed climbing 3-5 steps with a railing? : A Lot 6 Click Score: 18    End of Session Equipment Utilized During Treatment: Back brace Activity Tolerance: Patient tolerated treatment well;Patient limited by pain Patient left: in bed;with call bell/phone within reach;with bed alarm set   PT Visit Diagnosis: Other abnormalities of gait and mobility (R26.89);Pain;Difficulty in walking, not elsewhere classified (R26.2) Pain - part of body:  (back)     Time: 1005-1020 PT Time Calculation (min) (ACUTE ONLY): 15 min  Charges:  $Gait Training: 8-22 mins                         Doreatha Massed, PT Acute Rehabilitation  Office: [phone]

## 2022-03-18 ENCOUNTER — Inpatient Hospital Stay: Payer: BC Managed Care – PPO

## 2022-03-18 ENCOUNTER — Inpatient Hospital Stay: Payer: BC Managed Care – PPO | Admitting: Hematology and Oncology

## 2022-03-18 DIAGNOSIS — S32000S Wedge compression fracture of unspecified lumbar vertebra, sequela: Secondary | ICD-10-CM | POA: Diagnosis not present

## 2022-03-18 DIAGNOSIS — M4802 Spinal stenosis, cervical region: Secondary | ICD-10-CM | POA: Diagnosis not present

## 2022-03-18 DIAGNOSIS — M5459 Other low back pain: Secondary | ICD-10-CM | POA: Diagnosis not present

## 2022-03-18 DIAGNOSIS — M62838 Other muscle spasm: Secondary | ICD-10-CM | POA: Diagnosis not present

## 2022-03-19 ENCOUNTER — Telehealth: Payer: Self-pay | Admitting: *Deleted

## 2022-03-19 DIAGNOSIS — M5459 Other low back pain: Secondary | ICD-10-CM | POA: Diagnosis not present

## 2022-03-19 DIAGNOSIS — G894 Chronic pain syndrome: Secondary | ICD-10-CM | POA: Diagnosis not present

## 2022-03-19 DIAGNOSIS — K5903 Drug induced constipation: Secondary | ICD-10-CM | POA: Diagnosis not present

## 2022-03-19 NOTE — Telephone Encounter (Signed)
TCt patient regarding his future appts.  Pt did not come this week due to transition from in patient status to rehab. Spoke with patient. Advised that we still need to continue his treatments and he is currently scheduled for next Wednesday. He states he received a call about that and is discussing transportation avenues with facility. He plans on being here for his next treatment.

## 2022-03-20 ENCOUNTER — Other Ambulatory Visit: Payer: Self-pay

## 2022-03-21 DIAGNOSIS — S32000S Wedge compression fracture of unspecified lumbar vertebra, sequela: Secondary | ICD-10-CM | POA: Diagnosis not present

## 2022-03-21 DIAGNOSIS — M5459 Other low back pain: Secondary | ICD-10-CM | POA: Diagnosis not present

## 2022-03-21 DIAGNOSIS — M62838 Other muscle spasm: Secondary | ICD-10-CM | POA: Diagnosis not present

## 2022-03-21 DIAGNOSIS — M4802 Spinal stenosis, cervical region: Secondary | ICD-10-CM | POA: Diagnosis not present

## 2022-03-25 ENCOUNTER — Other Ambulatory Visit: Payer: Self-pay | Admitting: *Deleted

## 2022-03-25 DIAGNOSIS — C9000 Multiple myeloma not having achieved remission: Secondary | ICD-10-CM

## 2022-03-25 MED ORDER — LENALIDOMIDE 25 MG PO CAPS: 25.0000 mg | ORAL_CAPSULE | Freq: Every day | ORAL | 0 refills | Status: DC

## 2022-03-26 ENCOUNTER — Other Ambulatory Visit: Payer: Self-pay

## 2022-03-26 ENCOUNTER — Inpatient Hospital Stay: Payer: BC Managed Care – PPO | Attending: Physician Assistant

## 2022-03-26 ENCOUNTER — Inpatient Hospital Stay (HOSPITAL_BASED_OUTPATIENT_CLINIC_OR_DEPARTMENT_OTHER): Payer: BC Managed Care – PPO | Admitting: Physician Assistant

## 2022-03-26 ENCOUNTER — Inpatient Hospital Stay: Payer: BC Managed Care – PPO

## 2022-03-26 VITALS — BP 135/87 | HR 92 | Temp 98.3°F | Resp 17 | Ht 69.0 in | Wt 224.1 lb

## 2022-03-26 DIAGNOSIS — F17210 Nicotine dependence, cigarettes, uncomplicated: Secondary | ICD-10-CM | POA: Diagnosis not present

## 2022-03-26 DIAGNOSIS — C9000 Multiple myeloma not having achieved remission: Secondary | ICD-10-CM | POA: Insufficient documentation

## 2022-03-26 DIAGNOSIS — Z86010 Personal history of colonic polyps: Secondary | ICD-10-CM | POA: Insufficient documentation

## 2022-03-26 DIAGNOSIS — Z800 Family history of malignant neoplasm of digestive organs: Secondary | ICD-10-CM | POA: Diagnosis not present

## 2022-03-26 DIAGNOSIS — K449 Diaphragmatic hernia without obstruction or gangrene: Secondary | ICD-10-CM | POA: Insufficient documentation

## 2022-03-26 DIAGNOSIS — G893 Neoplasm related pain (acute) (chronic): Secondary | ICD-10-CM | POA: Insufficient documentation

## 2022-03-26 DIAGNOSIS — Z7961 Long term (current) use of immunomodulator: Secondary | ICD-10-CM | POA: Diagnosis not present

## 2022-03-26 DIAGNOSIS — Z79624 Long term (current) use of inhibitors of nucleotide synthesis: Secondary | ICD-10-CM | POA: Diagnosis not present

## 2022-03-26 DIAGNOSIS — R110 Nausea: Secondary | ICD-10-CM | POA: Diagnosis not present

## 2022-03-26 DIAGNOSIS — Z79899 Other long term (current) drug therapy: Secondary | ICD-10-CM | POA: Insufficient documentation

## 2022-03-26 DIAGNOSIS — I10 Essential (primary) hypertension: Secondary | ICD-10-CM | POA: Diagnosis not present

## 2022-03-26 DIAGNOSIS — Z5112 Encounter for antineoplastic immunotherapy: Secondary | ICD-10-CM | POA: Diagnosis not present

## 2022-03-26 DIAGNOSIS — K219 Gastro-esophageal reflux disease without esophagitis: Secondary | ICD-10-CM | POA: Insufficient documentation

## 2022-03-26 DIAGNOSIS — K5900 Constipation, unspecified: Secondary | ICD-10-CM | POA: Diagnosis not present

## 2022-03-26 DIAGNOSIS — Z7982 Long term (current) use of aspirin: Secondary | ICD-10-CM | POA: Diagnosis not present

## 2022-03-26 DIAGNOSIS — E785 Hyperlipidemia, unspecified: Secondary | ICD-10-CM | POA: Diagnosis not present

## 2022-03-26 DIAGNOSIS — Z8042 Family history of malignant neoplasm of prostate: Secondary | ICD-10-CM | POA: Diagnosis not present

## 2022-03-26 DIAGNOSIS — M4856XA Collapsed vertebra, not elsewhere classified, lumbar region, initial encounter for fracture: Secondary | ICD-10-CM | POA: Insufficient documentation

## 2022-03-26 LAB — CBC WITH DIFFERENTIAL (CANCER CENTER ONLY)
Abs Immature Granulocytes: 0.01 10*3/uL (ref 0.00–0.07)
Basophils Absolute: 0.1 10*3/uL (ref 0.0–0.1)
Basophils Relative: 1 %
Eosinophils Absolute: 0.1 10*3/uL (ref 0.0–0.5)
Eosinophils Relative: 1 %
HCT: 29.9 % — ABNORMAL LOW (ref 39.0–52.0)
Hemoglobin: 10.2 g/dL — ABNORMAL LOW (ref 13.0–17.0)
Immature Granulocytes: 0 %
Lymphocytes Relative: 37 %
Lymphs Abs: 2.1 10*3/uL (ref 0.7–4.0)
MCH: 30 pg (ref 26.0–34.0)
MCHC: 34.1 g/dL (ref 30.0–36.0)
MCV: 87.9 fL (ref 80.0–100.0)
Monocytes Absolute: 0.5 10*3/uL (ref 0.1–1.0)
Monocytes Relative: 8 %
Neutro Abs: 2.9 10*3/uL (ref 1.7–7.7)
Neutrophils Relative %: 53 %
Platelet Count: 256 10*3/uL (ref 150–400)
RBC: 3.4 MIL/uL — ABNORMAL LOW (ref 4.22–5.81)
RDW: 13.9 % (ref 11.5–15.5)
WBC Count: 5.6 10*3/uL (ref 4.0–10.5)
nRBC: 0 % (ref 0.0–0.2)

## 2022-03-26 LAB — CMP (CANCER CENTER ONLY)
ALT: 28 U/L (ref 0–44)
AST: 15 U/L (ref 15–41)
Albumin: 3.5 g/dL (ref 3.5–5.0)
Alkaline Phosphatase: 101 U/L (ref 38–126)
Anion gap: 5 (ref 5–15)
BUN: 8 mg/dL (ref 6–20)
CO2: 27 mmol/L (ref 22–32)
Calcium: 9.3 mg/dL (ref 8.9–10.3)
Chloride: 99 mmol/L (ref 98–111)
Creatinine: 0.88 mg/dL (ref 0.61–1.24)
GFR, Estimated: >60 mL/min (ref >60)
Glucose, Bld: 115 mg/dL — ABNORMAL HIGH (ref 70–99)
Potassium: 4.4 mmol/L (ref 3.5–5.1)
Sodium: 131 mmol/L — ABNORMAL LOW (ref 135–145)
Total Bilirubin: 0.4 mg/dL (ref 0.3–1.2)
Total Protein: 9.4 g/dL — ABNORMAL HIGH (ref 6.5–8.1)

## 2022-03-26 MED ORDER — BORTEZOMIB CHEMO SQ INJECTION 3.5 MG (2.5MG/ML)
1.3000 mg/m2 | Freq: Once | INTRAMUSCULAR | Status: AC
  Administered 2022-03-26: 3 mg via SUBCUTANEOUS
  Filled 2022-03-26: qty 1.2

## 2022-03-26 MED ORDER — DEXAMETHASONE 4 MG PO TABS
40.0000 mg | ORAL_TABLET | Freq: Once | ORAL | Status: AC
  Administered 2022-03-26: 40 mg via ORAL
  Filled 2022-03-26: qty 10

## 2022-03-26 NOTE — Progress Notes (Signed)
[facility] [phone]   [phone]  PROGRESS NOTE  Patient Care Team: [name], MD as PCP - General (Family Medicine) Lorretta Harp, MD as PCP - Cardiology (Cardiology) Gala Romney Cristopher Estimable, MD as Attending Physician (Gastroenterology)  Hematological/Oncological History # IgG Kappa Multiple Myeloma, 1p32/1q21.  01/04/2022: MRI lumbar spine shows diffuse heterogeneous marrow signal with heterogeneous enhancement throughout the thoracolumbar spine.  Additionally, there is subacute-chronic L3 vertebral body compression fraction. 01/14/2022: establish care with Dr. Lorenso Courier  02/10/2022: bone marrow biopsy shows range of plasma cell involvement from 50% on the biopsy to 60% on aspirate smear slides and close to 90% on the clot section for an overall percentage estimated at approximately 80% overall. 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  02/26/2022: Cycle 1 of VRD therapy  Interval History:  [name] 51 y.o. male with medical history significant for newly diagnosed IgG kappa multiple myeloma who presents for a follow up visit. The patient's last visit was on 02/26/2022 at which time he started VRD therapy. In the interim, he was admitted for intractable low back pain. He presents today to start Cycle 1, Day 15 of VRD therapy.   On exam today Mr. Vencill reports he is slowly improving with his strength and energy. He reports his back pain is generally under control with his current pain regimen but there are episodes of breakthrough pain. His appetite is stable and but has lost 20 lbs over the past month. He denies nausea, vomiting or abdominal pain. He takes miralax to minimze constipation. He denies easy bruising or signs of active bleeding.  He denies fevers, chills, sweats, shortness of breath, chest pain or cough. He has no other complaints. A full 10 point ROS was otherwise negative.  MEDICAL HISTORY:  Past  Medical History:  Diagnosis Date   Allergy    Anemia    Atypical chest pain    Cancer (HCC)    Multiple myeloma with normocytic anemia   GERD (gastroesophageal reflux disease)    Hepatic cyst    Benign by MRI   Hiatal hernia    History of colonic polyps    Hyperlipidemia    Hypertension    Rectal bleeding    Tobacco abuse     SURGICAL HISTORY: Past Surgical History:  Procedure Laterality Date   COLONOSCOPY  01/10/2009     RMR: Normal rectum, normal colon; repeat in 2015 due to [place] of colon cancer   COLONOSCOPY N/A 01/09/2014   GGE:ZMOQHUTM colonic polyps-removed as described   COLONOSCOPY N/A 09/18/2016   Procedure: COLONOSCOPY;  Surgeon: Daneil Dolin, MD;  [facility];  Service: Endoscopy;  Laterality: N/A;  730    ESOPHAGOGASTRODUODENOSCOPY  10/04/2007   RMR: Distal esophageal erosions consistent with erosive reflux esophagitis, patulous gastroesophageal junction status post passage of a  Maloney dilator, 75 [place].  Otherwise, unremarkable esophagus.  Hiatal hernia.  Otherwise normal stomach.  Bulbar erosion   ESOPHAGOGASTRODUODENOSCOPY N/A 01/09/2014   Erosive reflux esophagitis. Small hiatal hernia   HUMERUS IM NAIL Left 02/22/2022   Procedure: INTRAMEDULLARY (IM) NAIL HUMERAL;  Surgeon: Nicholes Stairs, MD;  Location: [facility];  Service: Orthopedics;  Laterality: Left;   I & D EXTREMITY Left 07/26/2018   Procedure: IRRIGATION AND DEBRIDEMENT EXTREMITY;  Surgeon: Dayna Barker, MD;  Location: [facility];  Service: Plastics;  Laterality: Left;   IR BONE TUMOR(S)RF ABLATION  02/05/2022   IR BONE TUMOR(S)RF ABLATION  02/24/2022   IR  KYPHO EA ADDL LEVEL THORACIC OR LUMBAR  02/19/2022   IR KYPHO LUMBAR INC FX REDUCE BONE BX UNI/BIL CANNULATION INC/IMAGING  02/05/2022   IR KYPHO LUMBAR INC FX REDUCE BONE BX UNI/BIL CANNULATION INC/IMAGING  02/19/2022   PERCUTANEOUS PINNING Left 07/26/2018   Procedure: PERCUTANEOUS PINNING EXTREMITY;  Surgeon: Dayna Barker, MD;  Location: [facility];  Service: Plastics;  Laterality: Left;    SOCIAL HISTORY: Social History   Socioeconomic History   Marital status: Single    Spouse name: Not on file   Number of children: 2   Years of education: Not on file   Highest education level: Not on file  Occupational History   Occupation: Scientist, 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   Smoking status: Every Day    Packs/day: 0.25    Years: 10.00    Total pack years: 2.50    Types: Cigarettes   Smokeless tobacco: Never  Vaping Use   Vaping Use: Never used  Substance and Sexual Activity   Alcohol use: Yes    Alcohol/week: 0.0 standard drinks of alcohol    Comment: occasional/wine on the weekends   Drug use: No   Sexual activity: Not on file  Other Topics Concern   Not on file  Social History Narrative   Right handed   Lives alone one story home   Social Determinants of Health   Financial Resource Strain: Not on file  Food Insecurity: No Food Insecurity (03/08/2022)   Hunger Vital Sign    Worried About Running Out of Food in the Last Year: Never true    Ran Out of Food in the Last Year: Never true  Transportation Needs: No Transportation Needs (03/08/2022)   PRAPARE - Hydrologist (Medical): No    Lack of Transportation (Non-Medical): No  Physical Activity: Not on file  Stress: Not on file  Social Connections: Not on file  Intimate Partner Violence: Not At Risk (03/08/2022)   Humiliation, Afraid, Rape, and Kick questionnaire    Fear of Current or Ex-Partner: No    Emotionally Abused: No    Physically Abused: No    Sexually Abused: No    FAMILY HISTORY: Family History  Problem Relation Age of Onset   Heart disease Mother    Hypertension Mother    Diabetes Father    Hypertension Father    Colon cancer Sister        passed away from colon cancer, in her 21s   Heart attack Maternal Uncle    Prostate cancer Maternal Uncle     Diabetes Other    Pancreatic cancer Neg Hx    Rectal cancer Neg Hx    Esophageal cancer Neg Hx    Stomach cancer Neg Hx     ALLERGIES:  has No Known Allergies.  MEDICATIONS:  Current Outpatient Medications  Medication Sig Dispense Refill   acetaminophen (TYLENOL) 500 MG tablet Take 1 tablet (500 mg total) by mouth every 8 (eight) hours as needed for mild pain or moderate pain. 30 tablet 0   acyclovir (ZOVIRAX) 400 MG tablet Take 1 tablet (400 mg total) by mouth 2 (two) times daily. 60 tablet 3   allopurinol (ZYLOPRIM) 300 MG tablet Take 1 tablet (300 mg total) by mouth daily. 30 tablet 3   amLODipine (NORVASC) 5 MG tablet Take 1 tablet (5 mg total) by mouth daily.     aspirin EC  81 MG tablet Take 81 mg by mouth daily. Swallow whole.     docusate sodium (COLACE) 100 MG capsule Take 1 capsule (100 mg total) by mouth 2 (two) times daily as needed for mild constipation. 25 capsule 0   gabapentin (NEURONTIN) 100 MG capsule Take 1 capsule (100 mg total) by mouth at bedtime.     lenalidomide (REVLIMID) 25 MG capsule Take 1 capsule (25 mg total) by mouth daily. Take for 14 days and then none for 7 days. Repeat every 21 days. [employer] Auth # [id number] Date Obtained  03/25/22 14 capsule 0   methocarbamol (ROBAXIN) 500 MG tablet Take 500 mg by mouth 3 (three) times daily.     methocarbamol 1000 MG TABS Take 1,000 mg by mouth every 6 (six) hours as needed for muscle spasms.     ondansetron (ZOFRAN) 8 MG tablet Take 1 tablet (8 mg total) by mouth every 8 (eight) hours as needed for nausea or vomiting. 60 tablet 0   pantoprazole (PROTONIX) 40 MG tablet Take 1 tablet (40 mg total) by mouth daily. 90 tablet 1   polyethylene glycol powder (GLYCOLAX/MIRALAX) 17 GM/SCOOP powder Take 1 capful (17 g) with water by mouth daily. 238 g 0   prochlorperazine (COMPAZINE) 10 MG tablet Take 1 tablet (10 mg total) by mouth every 6 (six) hours as needed for nausea or vomiting. 60 tablet 0   rosuvastatin (CRESTOR) 10 MG  tablet Take 10 mg by mouth daily.     No current facility-administered medications for this visit.    REVIEW OF SYSTEMS:   Constitutional: ( - ) fevers, ( - )  chills , ( - ) night sweats Eyes: ( - ) blurriness of vision, ( - ) double vision, ( - ) watery eyes Ears, nose, mouth, throat, and face: ( - ) mucositis, ( - ) sore throat Respiratory: ( - ) cough, ( - ) dyspnea, ( - ) wheezes Cardiovascular: ( - ) palpitation, ( - ) chest discomfort, ( - ) lower extremity swelling Gastrointestinal:  ( - ) nausea, ( - ) heartburn, ( - ) change in bowel habits Skin: ( - ) abnormal skin rashes Lymphatics: ( - ) new lymphadenopathy, ( - ) easy bruising Neurological: ( - ) numbness, ( - ) tingling, ( - ) new weaknesses Behavioral/Psych: ( - ) mood change, ( - ) new changes  All other systems were reviewed with the patient and are negative.  PHYSICAL EXAMINATION: ECOG PERFORMANCE STATUS: 1 - Symptomatic but completely ambulatory  Vitals:   03/26/22 1351  BP: 135/87  Pulse: 92  Resp: 17  Temp: 98.3 F (36.8 C)  SpO2: 99%   Filed Weights   03/26/22 1351  Weight: 224 lb 1.6 oz (101.7 kg)    GENERAL: Well-appearing young African-American male, alert, no distress and comfortable SKIN: skin color, texture, turgor are normal, no rashes or significant lesions EYES: conjunctiva are pink and non-injected, sclera clear LUNGS: clear to auscultation and percussion with normal breathing effort HEART: regular rate & rhythm and no murmurs and no lower extremity edema Musculoskeletal: no cyanosis of digits and no clubbing  PSYCH: alert & oriented x 3, fluent speech NEURO: no focal motor/sensory deficits  LABORATORY DATA:  I have reviewed the data as listed    Latest Ref Rng & Units 03/26/2022    1:36 PM 03/17/2022    3:37 AM 03/15/2022    3:40 AM  CBC  WBC 4.0 - 10.5 K/uL 5.6  5.1  4.7  Hemoglobin 13.0 - 17.0 g/dL 10.2  10.5  10.5   Hematocrit 39.0 - 52.0 % 29.9  32.8  33.1   Platelets 150 -  400 K/uL 256  201  163        Latest Ref Rng & Units 03/26/2022    1:36 PM 03/17/2022    3:37 AM 03/15/2022    3:40 AM  CMP  Glucose 70 - 99 mg/dL 115  118  110   BUN 6 - 20 mg/dL _0 Creatinine 0.61 - 1.24 mg/dL 0.88  1.04  1.05   Sodium 135 - 145 mmol/L 131  130  131   Potassium 3.5 - 5.1 mmol/L 4.4  4.7  4.5   Chloride 98 - 111 mmol/L 99  96  99   CO2 22 - 32 mmol/L _1 Calcium 8.9 - 10.3 mg/dL 9.3  9.7  9.6   Total Protein 6.5 - 8.1 g/dL 9.4  9.4  9.3   Total Bilirubin 0.3 - 1.2 mg/dL 0.4  0.8  0.6   Alkaline Phos 38 - 126 U/L 101  78  68   AST 15 - 41 U/L 15  30  34   ALT 0 - 44 U/L 28  104  86     Lab Results  Component Value Date   MPROTEIN 3.1 (H) 01/14/2022   Lab Results  Component Value Date   KPAFRELGTCHN 92.0 (H) 01/14/2022   LAMBDASER 9.4 01/14/2022   KAPLAMBRATIO 57.71 (H) 01/17/2022   KAPLAMBRATIO 9.79 (H) 01/14/2022   RADIOGRAPHIC STUDIES: DG Humerus Left  Result Date: 03/17/2022 CLINICAL DATA:  Postoperative pain; history of multiple myeloma EXAM: LEFT HUMERUS - 2+ VIEW COMPARISON:  02/22/2022 FINDINGS: IM nail with multiple proximal and single distal locking screws across an oblique slightly displaced fracture of the proximal LEFT humeral diaphysis. Degree of displacement at the fracture has slightly increased from the previous exam Endosteal scalloping at the level of the fracture consistent with pathologic fracture. Glenohumeral and elbow joint alignments normal. IMPRESSION: Post nailing of LEFT humerus with slightly increased displacement at pathologic fracture of the proximal LEFT humerus. Electronically Signed   By: Lavonia Dana M.D.   On: 03/17/2022 13:39   US RENAL  Result Date: 03/11/2022 CLINICAL DATA:  Acute kidney injury EXAM: RENAL / URINARY TRACT ULTRASOUND COMPLETE COMPARISON:  No prior dedicated renal ultrasound available FINDINGS: Right Kidney: Renal measurements: 11.9 x 5.7 x 5.6 cm = volume: 197 mL. Echogenicity within  normal limits. No mass or hydronephrosis visualized. Left Kidney: Renal measurements: 11.8 x 5.8 x 5.0 cm = volume: 176 mL. Echogenicity within normal limits. No mass or hydronephrosis visualized. Bladder: Appears normal for degree of bladder distention. No bladder jets visualized. Other: None. IMPRESSION: No significant sonographic abnormality of the kidneys. Electronically Signed   By: Merilyn Baba M.D.   On: 03/11/2022 18:28   MR Lumbar Spine W Wo Contrast  Result Date: 03/07/2022 CLINICAL DATA:  Mid back pain with suspected epidural abscess. EXAM: MRI THORACIC AND LUMBAR SPINE WITHOUT AND WITH CONTRAST TECHNIQUE: Multiplanar and multiecho pulse sequences of the thoracic and lumbar spine were obtained without and with intravenous contrast. CONTRAST:  2m GADAVIST GADOBUTROL 1 MMOL/ML IV SOLN COMPARISON:  Lumbar spine MRI 01/04/2022 FINDINGS: MRI THORACIC SPINE FINDINGS Alignment:  Physiologic. Vertebrae: Diffuse bone marrow heterogeneity.  No focal lesion. Cord: Small focus of hyperintense T2-weighted signal in the spinal cord at T2-3, T5-6 and T6-7. Paraspinal  and other soft tissues: Negative. Disc levels: Multilevel degenerative disc disease without spinal canal stenosis or neural impingement. MRI LUMBAR SPINE FINDINGS Segmentation:  Standard. Alignment:  Physiologic. Vertebrae: Diffuse bone marrow signal heterogeneity. Since 01/04/2022, there been compression fractures at L1, L2 and L3, now status post augmentation no acute lesion. Conus medullaris: Extends to the L1 level and appears normal. Paraspinal and other soft tissues: Distended urinary bladder Disc levels: L1-L2: Normal disc space and facet joints. No spinal canal stenosis. No neural foraminal stenosis. L2-L3: Normal disc space and facet joints. No spinal canal stenosis. No neural foraminal stenosis. L3-L4: Normal disc space and facet joints. No spinal canal stenosis. No neural foraminal stenosis. L4-L5: Small disc bulge with facet hypertrophy.  No spinal canal stenosis. Unchanged moderate left neural foraminal stenosis. L5-S1: Normal disc space and mild facet arthrosis. No spinal canal stenosis. No neural foraminal stenosis. Visualized sacrum: Normal. IMPRESSION: 1. No epidural abscess or other acute abnormality of the thoracic or lumbar spine. 2. Diffuse bone marrow heterogeneity consistent with ported diagnosis of multiple myeloma. 3. Small focus of hyperintense T2-weighted signal in the spinal cord at T2-3, T5-6 and T6-7. 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 4. Chronic compression fractures at L1, L2 and L3, now status post augmentation. 5. Unchanged moderate left L4-5 neural foraminal stenosis. 6. Distended urinary bladder. Correlate for urinary retention. Electronically Signed   By: Ulyses Jarred M.D.   On: 03/07/2022 19:30   MR THORACIC SPINE W WO CONTRAST  Result Date: 03/07/2022 CLINICAL DATA:  Mid back pain with suspected epidural abscess. EXAM: MRI THORACIC AND LUMBAR SPINE WITHOUT AND WITH CONTRAST TECHNIQUE: Multiplanar and multiecho pulse sequences of the thoracic and lumbar spine were obtained without and with intravenous contrast. CONTRAST:  82m GADAVIST GADOBUTROL 1 MMOL/ML IV SOLN COMPARISON:  Lumbar spine MRI 01/04/2022 FINDINGS: MRI THORACIC SPINE FINDINGS Alignment:  Physiologic. Vertebrae: Diffuse bone marrow heterogeneity.  No focal lesion. Cord: Small focus of hyperintense T2-weighted signal in the spinal cord at T2-3, T5-6 and T6-7. Paraspinal and other soft tissues: Negative. Disc levels: Multilevel degenerative disc disease without spinal canal stenosis or neural impingement. MRI LUMBAR SPINE FINDINGS Segmentation:  Standard. Alignment:  Physiologic. Vertebrae: Diffuse bone marrow signal heterogeneity. Since 01/04/2022, there been compression fractures at L1, L2 and L3, now status post augmentation no acute lesion. Conus medullaris: Extends to the L1 level and appears normal. Paraspinal and other soft  tissues: Distended urinary bladder Disc levels: L1-L2: Normal disc space and facet joints. No spinal canal stenosis. No neural foraminal stenosis. L2-L3: Normal disc space and facet joints. No spinal canal stenosis. No neural foraminal stenosis. L3-L4: Normal disc space and facet joints. No spinal canal stenosis. No neural foraminal stenosis. L4-L5: Small disc bulge with facet hypertrophy. No spinal canal stenosis. Unchanged moderate left neural foraminal stenosis. L5-S1: Normal disc space and mild facet arthrosis. No spinal canal stenosis. No neural foraminal stenosis. Visualized sacrum: Normal. IMPRESSION: 1. No epidural abscess or other acute abnormality of the thoracic or lumbar spine. 2. Diffuse bone marrow heterogeneity consistent with ported diagnosis of multiple myeloma. 3. Small focus of hyperintense T2-weighted signal in the spinal cord at T2-3, T5-6 and T6-7. 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 4. Chronic compression fractures at L1, L2 and L3, now status post augmentation. 5. Unchanged moderate left L4-5 neural foraminal stenosis. 6. Distended urinary bladder. Correlate for urinary retention. Electronically Signed   By: KUlyses JarredM.D.   On: 03/07/2022 19:30    ASSESSMENT & PLAN  BADEN BETSCH 51 y.o. male with medical history significant for newly diagnosed IgG kappa multiple myeloma who presents for a follow up visit.  After review of the labs, review the records, discussion with the patient the findings are most consistent with newly diagnosed multiple myeloma.  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  At this time would recommend proceeding with VRD chemotherapy with the intention of referral for ASCT when his labs are appropriate.  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  -- Diagnosis confirmed with lytic lesions of the spine as well as biopsy-proven plasmacytoma with 80% plasma cell involvement of the bone  marrow. -- Recommend VRD chemotherapy with intention of proceeding to transplant. -- Labs at each visit to include CBC, CMP, LDH with monthly restaging labs SPEP and serum free light chains -- Started VRD therapy on 02/26/2022.  PLAN: --Presents today to start Cycle 1, Day 15 of VRD therapy --Labs from today were reviewed and adequate for treatment. WBC 5.6, Hgb 10.2, Plt 256, Creatinine 0.88, Calcium 9.4  --Proceed with treatment today as planned without any dose modifications --Continue with weekly treatments and follow up in 2 weeks.   #Pathologic fractures-secondary to MM: --Involving L1, L2 and L3 compression fracture and left humerus fracture.  --Underwent kyphoplasty of L3 fracture on 02/19/2022 --Underwent medullary nailing of left humeral shaft on 02/22/2022  #Pain Medication -- Per inpatient palliative care during hospitalization in October 2023, recommendation is to continue on MS contin 60 mg q 8 hours, oxycodone 20 mg q 6 hours, change robaxin 1000 mg TID to QID.  -- Will make referral to see palliative care NP for further management of pain and other chronic symptoms.   #Supportive Care -- chemotherapy education complete -- port placement not required -- zofran 54m q8H PRN and compazine 166mPO q6H for nausea -- acyclovir 40062mO BID for VCZ prophylaxis -- allopurinol 300m66m daily for TLS prophylaxis -- Awaiting dental clearance for Zometa  Orders Placed This Encounter  Procedures   Multiple Myeloma Panel (SPEP&IFE w/QIG)    Standing Status:   Future    Standing Expiration Date:   04/02/2023   Kappa/lambda light chains    Standing Status:   Future    Standing Expiration Date:   04/02/2023   CBC with Differential ([facility])    Standing Status:   Future    Standing Expiration Date:   04/03/2023   CMP ([facility])    Standing Status:   Future    Standing Expiration Date:   04/03/2023   CBC with Differential ([facility])    Standing Status:    Future    Standing Expiration Date:   04/17/2023   CMP ([facility])    Standing Status:   Future    Standing Expiration Date:   04/17/2023    All questions were answered. The patient knows to call the clinic with any problems, questions or concerns.  I have spent a total of 30 minutes minutes of face-to-face and non-face-to-face time, preparing to see the patiEast Bradyedically appropriate examination, counseling and educating the patient, ordering medications/tests, documenting clinical information in the electronic health record,  and care coordination.   IrenDede QueryC [facility]: [phone]/05/2021 11:29 PM

## 2022-03-26 NOTE — Patient Instructions (Signed)
[facility] ONCOLOGY  Discharge Instructions: Thank you for choosing [facility] to provide your oncology and hematology care.   If you have a lab appointment with the [facility], please go directly to the [facility] and check in at the registration area.   Wear comfortable clothing and clothing appropriate for easy access to any Portacath or PICC line.   We strive to give you quality time with your provider. You may need to reschedule your appointment if you arrive late (15 or more minutes).  Arriving late affects you and other patients whose appointments are after yours.  Also, if you miss three or more appointments without notifying the office, you may be dismissed from the clinic at the provider's discretion.      For prescription refill requests, have your pharmacy contact our office and allow 72 hours for refills to be completed.    Today you received the following chemotherapy and/or immunotherapy agents: Velcade      To help prevent nausea and vomiting after your treatment, we encourage you to take your nausea medication as directed.  BELOW ARE SYMPTOMS THAT SHOULD BE REPORTED IMMEDIATELY: *FEVER GREATER THAN 100.4 F (38 C) OR HIGHER *CHILLS OR SWEATING *NAUSEA AND VOMITING THAT IS NOT CONTROLLED WITH YOUR NAUSEA MEDICATION *UNUSUAL SHORTNESS OF BREATH *UNUSUAL BRUISING OR BLEEDING *URINARY PROBLEMS (pain or burning when urinating, or frequent urination) *BOWEL PROBLEMS (unusual diarrhea, constipation, pain near the anus) TENDERNESS IN MOUTH AND THROAT WITH OR WITHOUT PRESENCE OF ULCERS (sore throat, sores in mouth, or a toothache) UNUSUAL RASH, SWELLING OR PAIN  UNUSUAL VAGINAL DISCHARGE OR ITCHING   Items with * indicate a potential emergency and should be followed up as soon as possible or go to the Emergency Department if any problems should occur.  Please show the CHEMOTHERAPY ALERT CARD or IMMUNOTHERAPY ALERT CARD at check-in to  the Emergency Department and triage nurse.  Should you have questions after your visit or need to cancel or reschedule your appointment, please contact [facility]  Dept: [phone]  and follow the prompts.  Office hours are 8:00 a.m. to 4:30 p.m. Monday - Friday. Please note that voicemails left after 4:00 p.m. may not be returned until the following business day.  We are closed weekends and major holidays. You have access to a nurse at all times for urgent questions. Please call the main number to the clinic Dept: [phone] and follow the prompts.   For any non-urgent questions, you may also contact your provider using MyChart. We now offer e-Visits for anyone 76 and older to request care online for non-urgent symptoms. For details visit [email or web address].   Also download the MyChart app! Go to the app store, search "MyChart", open the app, select [facility], and log in with your MyChart username and password.  Masks are optional in the cancer centers. If you would like for your care team to wear a mask while they are taking care of you, please let them know. You may have one support person who is at least 53 years old accompany you for your appointments.

## 2022-03-27 DIAGNOSIS — S32000S Wedge compression fracture of unspecified lumbar vertebra, sequela: Secondary | ICD-10-CM | POA: Diagnosis not present

## 2022-03-27 DIAGNOSIS — M4802 Spinal stenosis, cervical region: Secondary | ICD-10-CM | POA: Diagnosis not present

## 2022-03-27 DIAGNOSIS — G894 Chronic pain syndrome: Secondary | ICD-10-CM | POA: Diagnosis not present

## 2022-03-27 DIAGNOSIS — M5459 Other low back pain: Secondary | ICD-10-CM | POA: Diagnosis not present

## 2022-03-28 ENCOUNTER — Other Ambulatory Visit: Payer: Self-pay

## 2022-04-01 ENCOUNTER — Other Ambulatory Visit: Payer: Self-pay

## 2022-04-01 DIAGNOSIS — R140 Abdominal distension (gaseous): Secondary | ICD-10-CM | POA: Diagnosis not present

## 2022-04-01 DIAGNOSIS — K219 Gastro-esophageal reflux disease without esophagitis: Secondary | ICD-10-CM | POA: Diagnosis not present

## 2022-04-01 DIAGNOSIS — R1013 Epigastric pain: Secondary | ICD-10-CM | POA: Diagnosis not present

## 2022-04-02 ENCOUNTER — Inpatient Hospital Stay: Payer: BC Managed Care – PPO

## 2022-04-02 ENCOUNTER — Other Ambulatory Visit: Payer: Self-pay | Admitting: Hematology and Oncology

## 2022-04-02 ENCOUNTER — Encounter: Payer: Self-pay | Admitting: Nurse Practitioner

## 2022-04-02 ENCOUNTER — Other Ambulatory Visit: Payer: Self-pay

## 2022-04-02 ENCOUNTER — Inpatient Hospital Stay (HOSPITAL_BASED_OUTPATIENT_CLINIC_OR_DEPARTMENT_OTHER): Payer: BC Managed Care – PPO | Admitting: Nurse Practitioner

## 2022-04-02 VITALS — BP 128/81 | HR 98 | Temp 99.7°F | Resp 17 | Wt 217.5 lb

## 2022-04-02 DIAGNOSIS — C9000 Multiple myeloma not having achieved remission: Secondary | ICD-10-CM

## 2022-04-02 DIAGNOSIS — G893 Neoplasm related pain (acute) (chronic): Secondary | ICD-10-CM

## 2022-04-02 DIAGNOSIS — M4856XA Collapsed vertebra, not elsewhere classified, lumbar region, initial encounter for fracture: Secondary | ICD-10-CM | POA: Diagnosis not present

## 2022-04-02 DIAGNOSIS — K5900 Constipation, unspecified: Secondary | ICD-10-CM

## 2022-04-02 DIAGNOSIS — Z86010 Personal history of colonic polyps: Secondary | ICD-10-CM | POA: Diagnosis not present

## 2022-04-02 DIAGNOSIS — R110 Nausea: Secondary | ICD-10-CM | POA: Diagnosis not present

## 2022-04-02 DIAGNOSIS — K449 Diaphragmatic hernia without obstruction or gangrene: Secondary | ICD-10-CM | POA: Diagnosis not present

## 2022-04-02 DIAGNOSIS — K219 Gastro-esophageal reflux disease without esophagitis: Secondary | ICD-10-CM | POA: Diagnosis not present

## 2022-04-02 DIAGNOSIS — Z7982 Long term (current) use of aspirin: Secondary | ICD-10-CM | POA: Diagnosis not present

## 2022-04-02 DIAGNOSIS — I10 Essential (primary) hypertension: Secondary | ICD-10-CM | POA: Diagnosis not present

## 2022-04-02 DIAGNOSIS — K5903 Drug induced constipation: Secondary | ICD-10-CM

## 2022-04-02 DIAGNOSIS — F17210 Nicotine dependence, cigarettes, uncomplicated: Secondary | ICD-10-CM | POA: Diagnosis not present

## 2022-04-02 DIAGNOSIS — K30 Functional dyspepsia: Secondary | ICD-10-CM

## 2022-04-02 DIAGNOSIS — Z515 Encounter for palliative care: Secondary | ICD-10-CM | POA: Diagnosis not present

## 2022-04-02 DIAGNOSIS — Z8042 Family history of malignant neoplasm of prostate: Secondary | ICD-10-CM | POA: Diagnosis not present

## 2022-04-02 DIAGNOSIS — Z7961 Long term (current) use of immunomodulator: Secondary | ICD-10-CM | POA: Diagnosis not present

## 2022-04-02 DIAGNOSIS — Z5112 Encounter for antineoplastic immunotherapy: Secondary | ICD-10-CM | POA: Diagnosis not present

## 2022-04-02 DIAGNOSIS — Z7189 Other specified counseling: Secondary | ICD-10-CM

## 2022-04-02 DIAGNOSIS — E785 Hyperlipidemia, unspecified: Secondary | ICD-10-CM | POA: Diagnosis not present

## 2022-04-02 DIAGNOSIS — Z79624 Long term (current) use of inhibitors of nucleotide synthesis: Secondary | ICD-10-CM | POA: Diagnosis not present

## 2022-04-02 LAB — CBC WITH DIFFERENTIAL (CANCER CENTER ONLY)
Abs Immature Granulocytes: 0 10*3/uL (ref 0.00–0.07)
Basophils Absolute: 0 10*3/uL (ref 0.0–0.1)
Basophils Relative: 0 %
Eosinophils Absolute: 0 10*3/uL (ref 0.0–0.5)
Eosinophils Relative: 0 %
HCT: 31.3 % — ABNORMAL LOW (ref 39.0–52.0)
Hemoglobin: 10.5 g/dL — ABNORMAL LOW (ref 13.0–17.0)
Immature Granulocytes: 0 %
Lymphocytes Relative: 27 %
Lymphs Abs: 0.8 10*3/uL (ref 0.7–4.0)
MCH: 29.3 pg (ref 26.0–34.0)
MCHC: 33.5 g/dL (ref 30.0–36.0)
MCV: 87.4 fL (ref 80.0–100.0)
Monocytes Absolute: 0.1 10*3/uL (ref 0.1–1.0)
Monocytes Relative: 4 %
Neutro Abs: 1.9 10*3/uL (ref 1.7–7.7)
Neutrophils Relative %: 69 %
Platelet Count: 160 10*3/uL (ref 150–400)
RBC: 3.58 MIL/uL — ABNORMAL LOW (ref 4.22–5.81)
RDW: 13.4 % (ref 11.5–15.5)
Smear Review: NORMAL
WBC Count: 2.8 10*3/uL — ABNORMAL LOW (ref 4.0–10.5)
nRBC: 0 % (ref 0.0–0.2)

## 2022-04-02 LAB — CMP (CANCER CENTER ONLY)
ALT: 27 U/L (ref 0–44)
AST: 18 U/L (ref 15–41)
Albumin: 3.5 g/dL (ref 3.5–5.0)
Alkaline Phosphatase: 103 U/L (ref 38–126)
Anion gap: 8 (ref 5–15)
BUN: 9 mg/dL (ref 6–20)
CO2: 27 mmol/L (ref 22–32)
Calcium: 9.3 mg/dL (ref 8.9–10.3)
Chloride: 98 mmol/L (ref 98–111)
Creatinine: 0.91 mg/dL (ref 0.61–1.24)
GFR, Estimated: >60 mL/min (ref >60)
Glucose, Bld: 101 mg/dL — ABNORMAL HIGH (ref 70–99)
Potassium: 3.9 mmol/L (ref 3.5–5.1)
Sodium: 133 mmol/L — ABNORMAL LOW (ref 135–145)
Total Bilirubin: 0.7 mg/dL (ref 0.3–1.2)
Total Protein: 8.5 g/dL — ABNORMAL HIGH (ref 6.5–8.1)

## 2022-04-02 MED ORDER — MORPHINE SULFATE ER 60 MG PO TBCR: 60.0000 mg | EXTENDED_RELEASE_TABLET | Freq: Three times a day (TID) | ORAL | 0 refills | Status: DC

## 2022-04-02 MED ORDER — GABAPENTIN 100 MG PO CAPS: 100.0000 mg | ORAL_CAPSULE | Freq: Two times a day (BID) | ORAL | 1 refills | Status: DC

## 2022-04-02 MED ORDER — OXYCODONE HCL 20 MG PO TABS: 20.0000 mg | ORAL_TABLET | Freq: Four times a day (QID) | ORAL | 0 refills | Status: DC | PRN

## 2022-04-02 MED ORDER — PANTOPRAZOLE SODIUM 40 MG PO TBEC: 40.0000 mg | DELAYED_RELEASE_TABLET | Freq: Two times a day (BID) | ORAL | 2 refills | Status: DC

## 2022-04-02 MED ORDER — DEXAMETHASONE 4 MG PO TABS
40.0000 mg | ORAL_TABLET | Freq: Once | ORAL | Status: AC
  Administered 2022-04-02: 40 mg via ORAL
  Filled 2022-04-02: qty 10

## 2022-04-02 MED ORDER — METHOCARBAMOL 1000 MG PO TABS: 1000.0000 mg | ORAL_TABLET | Freq: Four times a day (QID) | ORAL | 1 refills | Status: DC | PRN

## 2022-04-02 MED ORDER — BORTEZOMIB CHEMO SQ INJECTION 3.5 MG (2.5MG/ML)
1.3000 mg/m2 | Freq: Once | INTRAMUSCULAR | Status: AC
  Administered 2022-04-02: 3 mg via SUBCUTANEOUS
  Filled 2022-04-02: qty 1.2

## 2022-04-02 NOTE — Addendum Note (Signed)
Addended by: Jimmy Footman on: 04/02/2022 10:31 AM   Modules accepted: Orders

## 2022-04-02 NOTE — Patient Instructions (Signed)
[name]  Discharge Instructions: Thank you for choosing [facility] to provide your oncology and hematology care.   If you have a lab appointment with the [facility], please go directly to the [facility] and check in at the registration area.   Wear comfortable clothing and clothing appropriate for easy access to any Portacath or PICC line.   We strive to give you quality time with your provider. You may need to reschedule your appointment if you arrive late (15 or more minutes).  Arriving late affects you and other patients whose appointments are after yours.  Also, if you miss three or more appointments without notifying the office, you may be dismissed from the clinic at the provider's discretion.      For prescription refill requests, have your pharmacy contact our office and allow 72 hours for refills to be completed.    Today you received the following chemotherapy and/or immunotherapy agents: Velcade      To help prevent nausea and vomiting after your treatment, we encourage you to take your nausea medication as directed.  BELOW ARE SYMPTOMS THAT SHOULD BE REPORTED IMMEDIATELY: *FEVER GREATER THAN 100.4 F (38 C) OR HIGHER *CHILLS OR SWEATING *NAUSEA AND VOMITING THAT IS NOT CONTROLLED WITH YOUR NAUSEA MEDICATION *UNUSUAL SHORTNESS OF BREATH *UNUSUAL BRUISING OR BLEEDING *URINARY PROBLEMS (pain or burning when urinating, or frequent urination) *BOWEL PROBLEMS (unusual diarrhea, constipation, pain near the anus) TENDERNESS IN MOUTH AND THROAT WITH OR WITHOUT PRESENCE OF ULCERS (sore throat, sores in mouth, or a toothache) UNUSUAL RASH, SWELLING OR PAIN  UNUSUAL VAGINAL DISCHARGE OR ITCHING   Items with * indicate a potential emergency and should be followed up as soon as possible or go to the Emergency Department if any problems should occur.  Please show the CHEMOTHERAPY ALERT CARD or IMMUNOTHERAPY ALERT CARD at check-in to  the Emergency Department and triage nurse.  Should you have questions after your visit or need to cancel or reschedule your appointment, please contact [facility]  Dept: [phone]  and follow the prompts.  Office hours are 8:00 a.m. to 4:30 p.m. Monday - Friday. Please note that voicemails left after 4:00 p.m. may not be returned until the following business day.  We are closed weekends and major holidays. You have access to a nurse at all times for urgent questions. Please call the main number to the clinic Dept: [phone] and follow the prompts.   For any non-urgent questions, you may also contact your provider using MyChart. We now offer e-Visits for anyone 24 and older to request care online for non-urgent symptoms. For details visit [email or web address].   Also download the MyChart app! Go to the app store, search "MyChart", open the app, select [facility], and log in with your MyChart username and password.  Masks are optional in the cancer centers. If you would like for your care team to wear a mask while they are taking care of you, please let them know. You may have one support person who is at least 53 years old accompany you for your appointments.

## 2022-04-02 NOTE — Progress Notes (Signed)
[facility]  [phone] Fax:([phone]) [phone]   Name: NAFTOLI PENNY Date: 04/02/2022 MRN: [id number]  DOB: 28-Nov-1970  Patient Care Team: [name], MD as PCP - General (Family Medicine) Lorretta Harp, MD as PCP - Cardiology (Cardiology) Gala Romney Cristopher Estimable, MD as Attending Physician (Gastroenterology)    REASON FOR CONSULTATION: HARRELL NIEHOFF is a 51 y.o. male with oncologic medical history including IgG Kappa Multiple Myeloma, L1, L2, and L3 vertebral compression fraction s/p kyphoplasty(9/27) and IM nailing of left humeral shaft (9/30), currently undergoing VRD therapy.  Palliative ask to see for symptom management.    SOCIAL HISTORY:     reports that he has been smoking cigarettes. He has a 2.50 pack-year smoking history. He has never used smokeless tobacco. He reports current alcohol use. He reports that he does not use drugs.  ADVANCE DIRECTIVES:    CODE STATUS:   PAST MEDICAL HISTORY: Past Medical History:  Diagnosis Date   Allergy    Anemia    Atypical chest pain    Cancer (HCC)    Multiple myeloma with normocytic anemia   GERD (gastroesophageal reflux disease)    Hepatic cyst    Benign by MRI   Hiatal hernia    History of colonic polyps    Hyperlipidemia    Hypertension    Rectal bleeding    Tobacco abuse     PAST SURGICAL HISTORY:  Past Surgical History:  Procedure Laterality Date   COLONOSCOPY  01/10/2009     RMR: Normal rectum, normal colon; repeat in 2015 due to [place] of colon cancer   COLONOSCOPY N/A 01/09/2014   LPN:[id number] colonic polyps-removed as described   COLONOSCOPY N/A 09/18/2016   Procedure: COLONOSCOPY;  Surgeon: Daneil Dolin, MD;  [facility];  Service: Endoscopy;  Laterality: N/A;  730    ESOPHAGOGASTRODUODENOSCOPY  10/04/2007   RMR: Distal esophageal erosions consistent with erosive reflux esophagitis, patulous gastroesophageal junction status post passage of a   Maloney dilator, 66 [place].  Otherwise, unremarkable esophagus.  Hiatal hernia.  Otherwise normal stomach.  Bulbar erosion   ESOPHAGOGASTRODUODENOSCOPY N/A 01/09/2014   Erosive reflux esophagitis. Small hiatal hernia   HUMERUS IM NAIL Left 02/22/2022   Procedure: INTRAMEDULLARY (IM) NAIL HUMERAL;  Surgeon: Nicholes Stairs, MD;  Location: [facility];  Service: Orthopedics;  Laterality: Left;   I & D EXTREMITY Left 07/26/2018   Procedure: IRRIGATION AND DEBRIDEMENT EXTREMITY;  Surgeon: Dayna Barker, MD;  Location: [facility];  Service: Plastics;  Laterality: Left;   IR BONE TUMOR(S)RF ABLATION  02/05/2022   IR BONE TUMOR(S)RF ABLATION  02/24/2022   IR KYPHO EA ADDL LEVEL THORACIC OR LUMBAR  02/19/2022   IR KYPHO LUMBAR INC FX REDUCE BONE BX UNI/BIL CANNULATION INC/IMAGING  02/05/2022   IR KYPHO LUMBAR INC FX REDUCE BONE BX UNI/BIL CANNULATION INC/IMAGING  02/19/2022   PERCUTANEOUS PINNING Left 07/26/2018   Procedure: PERCUTANEOUS PINNING EXTREMITY;  Surgeon: Dayna Barker, MD;  Location: [facility];  Service: Plastics;  Laterality: Left;    HEMATOLOGY/ONCOLOGY HISTORY:  Oncology History  Multiple myeloma not having achieved remission ([place])  02/16/2022 Initial Diagnosis   Multiple myeloma not having achieved remission ([place])   02/26/2022 -  Chemotherapy   Patient is on Treatment Plan : MYELOMA  RVD SQ q21d x 4 cycles       ALLERGIES:  has No Known Allergies.  MEDICATIONS:  Current Outpatient Medications  Medication Sig Dispense Refill   acetaminophen (  TYLENOL) 500 MG tablet Take 1 tablet (500 mg total) by mouth every 8 (eight) hours as needed for mild pain or moderate pain. 30 tablet 0   acyclovir (ZOVIRAX) 400 MG tablet Take 1 tablet (400 mg total) by mouth 2 (two) times daily. 60 tablet 3   allopurinol (ZYLOPRIM) 300 MG tablet Take 1 tablet (300 mg total) by mouth daily. 30 tablet 3   amLODipine (NORVASC) 5 MG tablet Take 1 tablet (5 mg total) by mouth daily.     aspirin EC 81 MG tablet Take 81 mg by  mouth daily. Swallow whole.     docusate sodium (COLACE) 100 MG capsule Take 1 capsule (100 mg total) by mouth 2 (two) times daily as needed for mild constipation. 25 capsule 0   gabapentin (NEURONTIN) 100 MG capsule Take 1 capsule (100 mg total) by mouth at bedtime.     lenalidomide (REVLIMID) 25 MG capsule Take 1 capsule (25 mg total) by mouth daily. Take for 14 days and then none for 7 days. Repeat every 21 days. [employer] Auth # [id number] Date Obtained  03/25/22 14 capsule 0   methocarbamol (ROBAXIN) 500 MG tablet Take 500 mg by mouth 3 (three) times daily.     methocarbamol 1000 MG TABS Take 1,000 mg by mouth every 6 (six) hours as needed for muscle spasms.     ondansetron (ZOFRAN) 8 MG tablet Take 1 tablet (8 mg total) by mouth every 8 (eight) hours as needed for nausea or vomiting. 60 tablet 0   pantoprazole (PROTONIX) 40 MG tablet Take 1 tablet (40 mg total) by mouth daily. 90 tablet 1   polyethylene glycol powder (GLYCOLAX/MIRALAX) 17 GM/SCOOP powder Take 1 capful (17 g) with water by mouth daily. 238 g 0   prochlorperazine (COMPAZINE) 10 MG tablet Take 1 tablet (10 mg total) by mouth every 6 (six) hours as needed for nausea or vomiting. 60 tablet 0   rosuvastatin (CRESTOR) 10 MG tablet Take 10 mg by mouth daily.     No current facility-administered medications for this visit.   Facility-Administered Medications Ordered in Other Visits  Medication Dose Route Frequency Provider Last Rate Last Admin   bortezomib SQ (VELCADE) chemo injection (2.76m/mL concentration) 3 mg  1.3 mg/m2 (Treatment Plan Recorded) Subcutaneous Once DOrson Slick MD        VITAL SIGNS: BP 128/81 (BP Location: Right Arm, Patient Position: Sitting)   Pulse 98   Temp 99.7 F (37.6 C) (Oral)   Resp 17   Wt 217 lb 8 oz (98.7 kg)   SpO2 99%   BMI 32.12 kg/m  Filed Weights   04/02/22 0826  Weight: 217 lb 8 oz (98.7 kg)    Estimated body mass index is 32.12 kg/m as calculated from the following:   Height  as of 03/26/22: _0  (1.753 m).   Weight as of this encounter: 217 lb 8 oz (98.7 kg).  LABS: CBC:    Component Value Date/Time   WBC 2.8 (L) 04/02/2022 0814   WBC 5.1 03/17/2022 0337   HGB 10.5 (L) 04/02/2022 0814   HGB 13.5 12/05/2021 1556   HCT 31.3 (L) 04/02/2022 0814   HCT 40.6 12/05/2021 1556   PLT 160 04/02/2022 0814   PLT 238 12/05/2021 1556   MCV 87.4 04/02/2022 0814   MCV 87 12/05/2021 1556   NEUTROABS 1.9 04/02/2022 0814   NEUTROABS 4.4 12/05/2021 1556   LYMPHSABS 0.8 04/02/2022 0814   LYMPHSABS 3.7 (H) 12/05/2021 1556  MONOABS 0.1 04/02/2022 0814   EOSABS 0.0 04/02/2022 0814   EOSABS 0.2 12/05/2021 1556   BASOSABS 0.0 04/02/2022 0814   BASOSABS 0.0 12/05/2021 1556   Comprehensive Metabolic Panel:    Component Value Date/Time   NA 133 (L) 04/02/2022 0814   NA 135 12/05/2021 1556   K 3.9 04/02/2022 0814   CL 98 04/02/2022 0814   CO2 27 04/02/2022 0814   BUN 9 04/02/2022 0814   BUN 13 12/05/2021 1556   CREATININE 0.91 04/02/2022 0814   CREATININE 1.23 06/01/2012 1150   GLUCOSE 101 (H) 04/02/2022 0814   CALCIUM 9.3 04/02/2022 0814   AST 18 04/02/2022 0814   ALT 27 04/02/2022 0814   ALKPHOS 103 04/02/2022 0814   BILITOT 0.7 04/02/2022 0814   PROT 8.5 (H) 04/02/2022 0814   PROT 9.0 (H) 12/05/2021 1556   ALBUMIN 3.5 04/02/2022 0814   ALBUMIN 3.7 (L) 12/05/2021 1556    RADIOGRAPHIC STUDIES: DG Humerus Left  Result Date: 03/17/2022 CLINICAL DATA:  Postoperative pain; history of multiple myeloma EXAM: LEFT HUMERUS - 2+ VIEW COMPARISON:  02/22/2022 FINDINGS: IM nail with multiple proximal and single distal locking screws across an oblique slightly displaced fracture of the proximal LEFT humeral diaphysis. Degree of displacement at the fracture has slightly increased from the previous exam Endosteal scalloping at the level of the fracture consistent with pathologic fracture. Glenohumeral and elbow joint alignments normal. IMPRESSION: Post nailing of LEFT humerus  with slightly increased displacement at pathologic fracture of the proximal LEFT humerus. Electronically Signed   By: Lavonia Dana M.D.   On: 03/17/2022 13:39   US RENAL  Result Date: 03/11/2022 CLINICAL DATA:  Acute kidney injury EXAM: RENAL / URINARY TRACT ULTRASOUND COMPLETE COMPARISON:  No prior dedicated renal ultrasound available FINDINGS: Right Kidney: Renal measurements: 11.9 x 5.7 x 5.6 cm = volume: 197 mL. Echogenicity within normal limits. No mass or hydronephrosis visualized. Left Kidney: Renal measurements: 11.8 x 5.8 x 5.0 cm = volume: 176 mL. Echogenicity within normal limits. No mass or hydronephrosis visualized. Bladder: Appears normal for degree of bladder distention. No bladder jets visualized. Other: None. IMPRESSION: No significant sonographic abnormality of the kidneys. Electronically Signed   By: Merilyn Baba M.D.   On: 03/11/2022 18:28   MR Lumbar Spine W Wo Contrast  Result Date: 03/07/2022 CLINICAL DATA:  Mid back pain with suspected epidural abscess. EXAM: MRI THORACIC AND LUMBAR SPINE WITHOUT AND WITH CONTRAST TECHNIQUE: Multiplanar and multiecho pulse sequences of the thoracic and lumbar spine were obtained without and with intravenous contrast. CONTRAST:  66m GADAVIST GADOBUTROL 1 MMOL/ML IV SOLN COMPARISON:  Lumbar spine MRI 01/04/2022 FINDINGS: MRI THORACIC SPINE FINDINGS Alignment:  Physiologic. Vertebrae: Diffuse bone marrow heterogeneity.  No focal lesion. Cord: Small focus of hyperintense T2-weighted signal in the spinal cord at T2-3, T5-6 and T6-7. Paraspinal and other soft tissues: Negative. Disc levels: Multilevel degenerative disc disease without spinal canal stenosis or neural impingement. MRI LUMBAR SPINE FINDINGS Segmentation:  Standard. Alignment:  Physiologic. Vertebrae: Diffuse bone marrow signal heterogeneity. Since 01/04/2022, there been compression fractures at L1, L2 and L3, now status post augmentation no acute lesion. Conus medullaris: Extends to the L1  level and appears normal. Paraspinal and other soft tissues: Distended urinary bladder Disc levels: L1-L2: Normal disc space and facet joints. No spinal canal stenosis. No neural foraminal stenosis. L2-L3: Normal disc space and facet joints. No spinal canal stenosis. No neural foraminal stenosis. L3-L4: Normal disc space and facet joints. No spinal canal  stenosis. No neural foraminal stenosis. L4-L5: Small disc bulge with facet hypertrophy. No spinal canal stenosis. Unchanged moderate left neural foraminal stenosis. L5-S1: Normal disc space and mild facet arthrosis. No spinal canal stenosis. No neural foraminal stenosis. Visualized sacrum: Normal. IMPRESSION: 1. No epidural abscess or other acute abnormality of the thoracic or lumbar spine. 2. Diffuse bone marrow heterogeneity consistent with ported diagnosis of multiple myeloma. 3. Small focus of hyperintense T2-weighted signal in the spinal cord at T2-3, T5-6 and T6-7. 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 4. Chronic compression fractures at L1, L2 and L3, now status post augmentation. 5. Unchanged moderate left L4-5 neural foraminal stenosis. 6. Distended urinary bladder. Correlate for urinary retention. Electronically Signed   By: Ulyses Jarred M.D.   On: 03/07/2022 19:30   MR THORACIC SPINE W WO CONTRAST  Result Date: 03/07/2022 CLINICAL DATA:  Mid back pain with suspected epidural abscess. EXAM: MRI THORACIC AND LUMBAR SPINE WITHOUT AND WITH CONTRAST TECHNIQUE: Multiplanar and multiecho pulse sequences of the thoracic and lumbar spine were obtained without and with intravenous contrast. CONTRAST:  36m GADAVIST GADOBUTROL 1 MMOL/ML IV SOLN COMPARISON:  Lumbar spine MRI 01/04/2022 FINDINGS: MRI THORACIC SPINE FINDINGS Alignment:  Physiologic. Vertebrae: Diffuse bone marrow heterogeneity.  No focal lesion. Cord: Small focus of hyperintense T2-weighted signal in the spinal cord at T2-3, T5-6 and T6-7. Paraspinal and other soft tissues:  Negative. Disc levels: Multilevel degenerative disc disease without spinal canal stenosis or neural impingement. MRI LUMBAR SPINE FINDINGS Segmentation:  Standard. Alignment:  Physiologic. Vertebrae: Diffuse bone marrow signal heterogeneity. Since 01/04/2022, there been compression fractures at L1, L2 and L3, now status post augmentation no acute lesion. Conus medullaris: Extends to the L1 level and appears normal. Paraspinal and other soft tissues: Distended urinary bladder Disc levels: L1-L2: Normal disc space and facet joints. No spinal canal stenosis. No neural foraminal stenosis. L2-L3: Normal disc space and facet joints. No spinal canal stenosis. No neural foraminal stenosis. L3-L4: Normal disc space and facet joints. No spinal canal stenosis. No neural foraminal stenosis. L4-L5: Small disc bulge with facet hypertrophy. No spinal canal stenosis. Unchanged moderate left neural foraminal stenosis. L5-S1: Normal disc space and mild facet arthrosis. No spinal canal stenosis. No neural foraminal stenosis. Visualized sacrum: Normal. IMPRESSION: 1. No epidural abscess or other acute abnormality of the thoracic or lumbar spine. 2. Diffuse bone marrow heterogeneity consistent with ported diagnosis of multiple myeloma. 3. Small focus of hyperintense T2-weighted signal in the spinal cord at T2-3, T5-6 and T6-7. 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 4. Chronic compression fractures at L1, L2 and L3, now status post augmentation. 5. Unchanged moderate left L4-5 neural foraminal stenosis. 6. Distended urinary bladder. Correlate for urinary retention. Electronically Signed   By: KUlyses JarredM.D.   On: 03/07/2022 19:30    PERFORMANCE STATUS (ECOG) : 1 - Symptomatic but completely ambulatory  Review of Systems  Constitutional:  Positive for fatigue.  Gastrointestinal:  Positive for constipation.  Musculoskeletal:  Positive for arthralgias and back pain.  Unless otherwise noted, a complete review of  systems is negative.  Physical Exam General: NAD, in wheelchair with TLSO brace in place Cardiovascular: regular rate and rhythm Pulmonary: normal breathing pattern Extremities: no edema, no joint deformities Skin: no rashes Neurological:AAO x3, mood appropriate   IMPRESSION: This is my initial visit with Mr. RMontanari He was initially seen by our palliative team during recent hospitalization. His sister, CHoyle Saueris present with him. He is in a wheelchair, has  a cane, and is wearing his TLSO brace. Mr. Urenda is alert and able to engage in discussions appropriately.   I introduced myself, [place] RN, and Palliative's role in collaboration with the oncology team. Concept of Palliative Care was introduced as specialized medical care for people and their families living with serious illness.  It focuses on providing relief from the symptoms and stress of a serious illness.  The goal is to improve quality of life for both the patient and the family. Values and goals of care important to patient and family were attempted to be elicited.   Delontae lives in the home alone. He has 3 children. He is originally from [place], [place]. Since recent hospitalization he has been at [facility] rehab with plans to discharge back home on tomorrow. 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   He is independent of all ADLs. Some fatigue. Some limitations due to pain and limited mobility. Appetite is good.   Neoplasm related pain Mr. Prust states his pain is well controlled for the most part.  Prior to his recent admission he went several days without his pain medication which she feels sick to him and to a severe pain crisis.  He currently rates his pain 4 out of 10 on current regimen.  Is hopeful for his pain to decrease to a 1-2.  We discussed a goal of pain management with realistic expectations that we may not be able to completely resolve his pain but with focus on improving his quality of life.  We  discussed his pain regimen at length with comparison to his records brought in from [facility].  He is taking MS Contin 60 mg every 8 hours.  Oxycodone 20 mg every 6 hours as needed for breakthrough pain.  Gabapentin 100 mg 3 times daily.  And Robaxin 1000 mg 3 times daily.  His regimen was recently adjusted by our palliative team during previous hospitalization.  We discussed continuing with current plan for now and closely monitoring.  Nausea/Indigetion Patient is complaining on ongoing gas and indigestion. He is taking protonix daily. Discussed increasing dose to twice daily.  He is having active bowel movements.  Is taking senna 2 tabs twice daily in addition to MiraLAX daily.  Advised he can decrease senna to once daily to see if this assist with decreasing bowel gas.  Also recommended use of simethicone which she states he has been taking at facility.  Constipation  Bowel movements in the present of senna 2 tabs twice daily and daily MiraLAX.  He is asking about taking castor oil to clean out his system.  Advised this is okay to do as a one-time dose.  Education provided on the importance of continue to adhere to bowel regimen in the setting of significant opioid use.  We discussed he is current illness and what it means in the larger context of his on-going co-morbidities. Natural disease trajectory and expectations were discussed.  Mr. Franchi and his sister are clear in their understanding of his current illness.  He is clear and expressed wishes to continue to treat the treatable aggressively and remaining hopeful for improvement not only in his cancer but his quality of life.  I discussed the importance of continued conversation with family and their medical providers regarding overall plan of care and treatment options, ensuring decisions are within the context of the patients values and GOCs.  PLAN: Established therapeutic relationship. Education provided on palliative's role  in collaboration with their  Oncology/Radiation team. Patient states he is scheduled to discharge home from [facility] rehab on tomorrow.  All prescriptions managed by our team will be sent to his local pharmacy to prevent any delay in treatment.  Also advised rehab to make sure patient is sent home with his oral chemotherapy pills. Protonix 40 mg twice daily Senna S2 tabs daily MiraLAX daily MS Contin 60 mg every 8 hours Oxycodone 20 mg every 6 hours as needed for breakthrough pain  Robaxin 1000 mg 4 times daily as needed Gabapentin 100 mg 3 times daily Zofran 8 mg every 8 hours as needed for nausea Compazine every 6 hours as needed for nausea We will continue to closely monitor and make adjustments as needed. I will plan to see patient back in 2-4 weeks in collaboration to other oncology appointments.    Patient expressed understanding and was in agreement with this plan. He also understands that He can call the clinic at any time with any questions, concerns, or complaints.   Thank you for your referral and allowing Palliative to assist in Mr. Jowan Skillin Procida's care.   Number and complexity of problems addressed: HIGH - 1 or more chronic illnesses with SEVERE exacerbation, progression, or side effects of treatment - advanced cancer, pain. Any controlled substances utilized were prescribed in the context of palliative care.  Time Total: 45 min   Visit consisted of counseling and education dealing with the complex and emotionally intense issues of symptom management and palliative care in the setting of serious and potentially life-threatening illness.Greater than 50%  of this time was spent counseling and coordinating care related to the above assessment and plan.  Signed by: Alda Lea, AGPCNP-BC Palliative Medicine Team/[facility] [place]

## 2022-04-02 NOTE — Patient Instructions (Signed)
-   call the office with any questions or concerns at [phone] -MyChart message your provider ([facility]na "Alda Lea, NP)

## 2022-04-03 ENCOUNTER — Other Ambulatory Visit: Payer: Self-pay

## 2022-04-03 ENCOUNTER — Other Ambulatory Visit: Payer: Self-pay | Admitting: Nurse Practitioner

## 2022-04-03 ENCOUNTER — Other Ambulatory Visit (HOSPITAL_COMMUNITY): Payer: Self-pay

## 2022-04-03 ENCOUNTER — Encounter: Payer: Self-pay | Admitting: Physician Assistant

## 2022-04-03 DIAGNOSIS — G893 Neoplasm related pain (acute) (chronic): Secondary | ICD-10-CM

## 2022-04-03 DIAGNOSIS — M4802 Spinal stenosis, cervical region: Secondary | ICD-10-CM | POA: Diagnosis not present

## 2022-04-03 DIAGNOSIS — C9000 Multiple myeloma not having achieved remission: Secondary | ICD-10-CM | POA: Diagnosis not present

## 2022-04-03 DIAGNOSIS — M6281 Muscle weakness (generalized): Secondary | ICD-10-CM | POA: Diagnosis not present

## 2022-04-03 DIAGNOSIS — S32000S Wedge compression fracture of unspecified lumbar vertebra, sequela: Secondary | ICD-10-CM | POA: Diagnosis not present

## 2022-04-03 DIAGNOSIS — Z515 Encounter for palliative care: Secondary | ICD-10-CM

## 2022-04-03 LAB — KAPPA/LAMBDA LIGHT CHAINS
Kappa free light chain: 90.1 mg/L — ABNORMAL HIGH (ref 3.3–19.4)
Kappa, lambda light chain ratio: 7.7 — ABNORMAL HIGH (ref 0.26–1.65)
Lambda free light chains: 11.7 mg/L (ref 5.7–26.3)

## 2022-04-03 MED ORDER — MORPHINE SULFATE ER 60 MG PO TBCR
60.0000 mg | EXTENDED_RELEASE_TABLET | Freq: Three times a day (TID) | ORAL | 0 refills | Status: DC
  Filled 2022-04-03: qty 90, 30d supply, fill #0

## 2022-04-04 ENCOUNTER — Telehealth: Payer: Self-pay | Admitting: Family Medicine

## 2022-04-04 DIAGNOSIS — D630 Anemia in neoplastic disease: Secondary | ICD-10-CM | POA: Diagnosis not present

## 2022-04-04 DIAGNOSIS — M109 Gout, unspecified: Secondary | ICD-10-CM | POA: Diagnosis not present

## 2022-04-04 DIAGNOSIS — K219 Gastro-esophageal reflux disease without esophagitis: Secondary | ICD-10-CM | POA: Diagnosis not present

## 2022-04-04 DIAGNOSIS — M4802 Spinal stenosis, cervical region: Secondary | ICD-10-CM | POA: Diagnosis not present

## 2022-04-04 DIAGNOSIS — S32000S Wedge compression fracture of unspecified lumbar vertebra, sequela: Secondary | ICD-10-CM | POA: Diagnosis not present

## 2022-04-04 DIAGNOSIS — G35 Multiple sclerosis: Secondary | ICD-10-CM | POA: Diagnosis not present

## 2022-04-04 DIAGNOSIS — E782 Mixed hyperlipidemia: Secondary | ICD-10-CM | POA: Diagnosis not present

## 2022-04-04 DIAGNOSIS — C9000 Multiple myeloma not having achieved remission: Secondary | ICD-10-CM | POA: Diagnosis not present

## 2022-04-04 DIAGNOSIS — I10 Essential (primary) hypertension: Secondary | ICD-10-CM | POA: Diagnosis not present

## 2022-04-04 DIAGNOSIS — K5903 Drug induced constipation: Secondary | ICD-10-CM | POA: Diagnosis not present

## 2022-04-04 DIAGNOSIS — S42202D Unspecified fracture of upper end of left humerus, subsequent encounter for fracture with routine healing: Secondary | ICD-10-CM | POA: Diagnosis not present

## 2022-04-04 DIAGNOSIS — N179 Acute kidney failure, unspecified: Secondary | ICD-10-CM | POA: Diagnosis not present

## 2022-04-04 DIAGNOSIS — M8458XD Pathological fracture in neoplastic disease, other specified site, subsequent encounter for fracture with routine healing: Secondary | ICD-10-CM | POA: Diagnosis not present

## 2022-04-04 DIAGNOSIS — T402X5D Adverse effect of other opioids, subsequent encounter: Secondary | ICD-10-CM | POA: Diagnosis not present

## 2022-04-04 DIAGNOSIS — G629 Polyneuropathy, unspecified: Secondary | ICD-10-CM | POA: Diagnosis not present

## 2022-04-04 DIAGNOSIS — G894 Chronic pain syndrome: Secondary | ICD-10-CM | POA: Diagnosis not present

## 2022-04-04 DIAGNOSIS — F17210 Nicotine dependence, cigarettes, uncomplicated: Secondary | ICD-10-CM | POA: Diagnosis not present

## 2022-04-04 NOTE — Telephone Encounter (Signed)
Home Health Verbal Orders - Caller/Agency: [facility]/ [facility]  Callback Number: [phone]/ vm can be left  Requesting Skilled Nursing Frequency: wants to continue care

## 2022-04-04 NOTE — Telephone Encounter (Signed)
Cal has been place to [employer]

## 2022-04-07 ENCOUNTER — Encounter: Payer: Self-pay | Admitting: Orthopaedic Surgery

## 2022-04-07 ENCOUNTER — Other Ambulatory Visit: Payer: Self-pay | Admitting: Physician Assistant

## 2022-04-07 DIAGNOSIS — S32000S Wedge compression fracture of unspecified lumbar vertebra, sequela: Secondary | ICD-10-CM | POA: Diagnosis not present

## 2022-04-07 DIAGNOSIS — C9000 Multiple myeloma not having achieved remission: Secondary | ICD-10-CM | POA: Diagnosis not present

## 2022-04-07 DIAGNOSIS — E782 Mixed hyperlipidemia: Secondary | ICD-10-CM | POA: Diagnosis not present

## 2022-04-07 DIAGNOSIS — K5903 Drug induced constipation: Secondary | ICD-10-CM | POA: Diagnosis not present

## 2022-04-07 DIAGNOSIS — N179 Acute kidney failure, unspecified: Secondary | ICD-10-CM | POA: Diagnosis not present

## 2022-04-07 DIAGNOSIS — K219 Gastro-esophageal reflux disease without esophagitis: Secondary | ICD-10-CM | POA: Diagnosis not present

## 2022-04-07 DIAGNOSIS — T402X5D Adverse effect of other opioids, subsequent encounter: Secondary | ICD-10-CM | POA: Diagnosis not present

## 2022-04-07 DIAGNOSIS — S42202D Unspecified fracture of upper end of left humerus, subsequent encounter for fracture with routine healing: Secondary | ICD-10-CM | POA: Diagnosis not present

## 2022-04-07 DIAGNOSIS — G35 Multiple sclerosis: Secondary | ICD-10-CM | POA: Diagnosis not present

## 2022-04-07 DIAGNOSIS — I10 Essential (primary) hypertension: Secondary | ICD-10-CM | POA: Diagnosis not present

## 2022-04-07 DIAGNOSIS — F17210 Nicotine dependence, cigarettes, uncomplicated: Secondary | ICD-10-CM | POA: Diagnosis not present

## 2022-04-07 DIAGNOSIS — D630 Anemia in neoplastic disease: Secondary | ICD-10-CM | POA: Diagnosis not present

## 2022-04-07 DIAGNOSIS — M8458XD Pathological fracture in neoplastic disease, other specified site, subsequent encounter for fracture with routine healing: Secondary | ICD-10-CM | POA: Diagnosis not present

## 2022-04-07 DIAGNOSIS — G629 Polyneuropathy, unspecified: Secondary | ICD-10-CM | POA: Diagnosis not present

## 2022-04-07 DIAGNOSIS — M4802 Spinal stenosis, cervical region: Secondary | ICD-10-CM | POA: Diagnosis not present

## 2022-04-07 DIAGNOSIS — R634 Abnormal weight loss: Secondary | ICD-10-CM

## 2022-04-07 DIAGNOSIS — M109 Gout, unspecified: Secondary | ICD-10-CM | POA: Diagnosis not present

## 2022-04-07 DIAGNOSIS — G894 Chronic pain syndrome: Secondary | ICD-10-CM | POA: Diagnosis not present

## 2022-04-07 LAB — MULTIPLE MYELOMA PANEL, SERUM
Albumin SerPl Elph-Mcnc: 3.3 g/dL (ref 2.9–4.4)
Albumin/Glob SerPl: 0.8 (ref 0.7–1.7)
Alpha 1: 0.4 g/dL (ref 0.0–0.4)
Alpha2 Glob SerPl Elph-Mcnc: 0.8 g/dL (ref 0.4–1.0)
B-Globulin SerPl Elph-Mcnc: 0.9 g/dL (ref 0.7–1.3)
Gamma Glob SerPl Elph-Mcnc: 2.4 g/dL — ABNORMAL HIGH (ref 0.4–1.8)
Globulin, Total: 4.5 g/dL — ABNORMAL HIGH (ref 2.2–3.9)
IgA: 57 mg/dL — ABNORMAL LOW (ref 90–386)
IgG (Immunoglobin G), Serum: 3402 mg/dL — ABNORMAL HIGH (ref 603–1613)
IgM (Immunoglobulin M), Srm: 60 mg/dL (ref 20–172)
M Protein SerPl Elph-Mcnc: 1.9 g/dL — ABNORMAL HIGH
Total Protein ELP: 7.8 g/dL (ref 6.0–8.5)

## 2022-04-08 ENCOUNTER — Inpatient Hospital Stay: Payer: BC Managed Care – PPO

## 2022-04-08 ENCOUNTER — Inpatient Hospital Stay (HOSPITAL_BASED_OUTPATIENT_CLINIC_OR_DEPARTMENT_OTHER): Payer: BC Managed Care – PPO | Admitting: Hematology and Oncology

## 2022-04-08 ENCOUNTER — Inpatient Hospital Stay: Payer: BC Managed Care – PPO | Admitting: Dietician

## 2022-04-08 VITALS — BP 111/67 | HR 96 | Temp 98.0°F | Resp 18 | Wt 212.8 lb

## 2022-04-08 DIAGNOSIS — R110 Nausea: Secondary | ICD-10-CM | POA: Diagnosis not present

## 2022-04-08 DIAGNOSIS — Z86010 Personal history of colonic polyps: Secondary | ICD-10-CM | POA: Diagnosis not present

## 2022-04-08 DIAGNOSIS — Z515 Encounter for palliative care: Secondary | ICD-10-CM

## 2022-04-08 DIAGNOSIS — F17210 Nicotine dependence, cigarettes, uncomplicated: Secondary | ICD-10-CM | POA: Diagnosis not present

## 2022-04-08 DIAGNOSIS — R634 Abnormal weight loss: Secondary | ICD-10-CM

## 2022-04-08 DIAGNOSIS — Z79624 Long term (current) use of inhibitors of nucleotide synthesis: Secondary | ICD-10-CM | POA: Diagnosis not present

## 2022-04-08 DIAGNOSIS — M4856XA Collapsed vertebra, not elsewhere classified, lumbar region, initial encounter for fracture: Secondary | ICD-10-CM | POA: Diagnosis not present

## 2022-04-08 DIAGNOSIS — C9000 Multiple myeloma not having achieved remission: Secondary | ICD-10-CM

## 2022-04-08 DIAGNOSIS — I10 Essential (primary) hypertension: Secondary | ICD-10-CM | POA: Diagnosis not present

## 2022-04-08 DIAGNOSIS — K5900 Constipation, unspecified: Secondary | ICD-10-CM | POA: Diagnosis not present

## 2022-04-08 DIAGNOSIS — Z7961 Long term (current) use of immunomodulator: Secondary | ICD-10-CM | POA: Diagnosis not present

## 2022-04-08 DIAGNOSIS — G893 Neoplasm related pain (acute) (chronic): Secondary | ICD-10-CM

## 2022-04-08 DIAGNOSIS — Z8042 Family history of malignant neoplasm of prostate: Secondary | ICD-10-CM | POA: Diagnosis not present

## 2022-04-08 DIAGNOSIS — Z7982 Long term (current) use of aspirin: Secondary | ICD-10-CM | POA: Diagnosis not present

## 2022-04-08 DIAGNOSIS — Z5112 Encounter for antineoplastic immunotherapy: Secondary | ICD-10-CM | POA: Diagnosis not present

## 2022-04-08 DIAGNOSIS — E785 Hyperlipidemia, unspecified: Secondary | ICD-10-CM | POA: Diagnosis not present

## 2022-04-08 DIAGNOSIS — K449 Diaphragmatic hernia without obstruction or gangrene: Secondary | ICD-10-CM | POA: Diagnosis not present

## 2022-04-08 DIAGNOSIS — K219 Gastro-esophageal reflux disease without esophagitis: Secondary | ICD-10-CM | POA: Diagnosis not present

## 2022-04-08 LAB — CMP (CANCER CENTER ONLY)
ALT: 53 U/L — ABNORMAL HIGH (ref 0–44)
AST: 27 U/L (ref 15–41)
Albumin: 3.9 g/dL (ref 3.5–5.0)
Alkaline Phosphatase: 171 U/L — ABNORMAL HIGH (ref 38–126)
Anion gap: 8 (ref 5–15)
BUN: 14 mg/dL (ref 6–20)
CO2: 26 mmol/L (ref 22–32)
Calcium: 9.2 mg/dL (ref 8.9–10.3)
Chloride: 96 mmol/L — ABNORMAL LOW (ref 98–111)
Creatinine: 1.04 mg/dL (ref 0.61–1.24)
GFR, Estimated: >60 mL/min (ref >60)
Glucose, Bld: 108 mg/dL — ABNORMAL HIGH (ref 70–99)
Potassium: 3.3 mmol/L — ABNORMAL LOW (ref 3.5–5.1)
Sodium: 130 mmol/L — ABNORMAL LOW (ref 135–145)
Total Bilirubin: 0.7 mg/dL (ref 0.3–1.2)
Total Protein: 8.6 g/dL — ABNORMAL HIGH (ref 6.5–8.1)

## 2022-04-08 LAB — CBC WITH DIFFERENTIAL (CANCER CENTER ONLY)
Abs Immature Granulocytes: 0.03 10*3/uL (ref 0.00–0.07)
Basophils Absolute: 0 10*3/uL (ref 0.0–0.1)
Basophils Relative: 0 %
Eosinophils Absolute: 0 10*3/uL (ref 0.0–0.5)
Eosinophils Relative: 0 %
HCT: 32.7 % — ABNORMAL LOW (ref 39.0–52.0)
Hemoglobin: 10.9 g/dL — ABNORMAL LOW (ref 13.0–17.0)
Immature Granulocytes: 1 %
Lymphocytes Relative: 29 %
Lymphs Abs: 1.3 10*3/uL (ref 0.7–4.0)
MCH: 28.9 pg (ref 26.0–34.0)
MCHC: 33.3 g/dL (ref 30.0–36.0)
MCV: 86.7 fL (ref 80.0–100.0)
Monocytes Absolute: 0.5 10*3/uL (ref 0.1–1.0)
Monocytes Relative: 12 %
Neutro Abs: 2.5 10*3/uL (ref 1.7–7.7)
Neutrophils Relative %: 58 %
Platelet Count: 168 10*3/uL (ref 150–400)
RBC: 3.77 MIL/uL — ABNORMAL LOW (ref 4.22–5.81)
RDW: 13.7 % (ref 11.5–15.5)
WBC Count: 4.4 10*3/uL (ref 4.0–10.5)
nRBC: 0 % (ref 0.0–0.2)

## 2022-04-08 MED ORDER — BORTEZOMIB CHEMO SQ INJECTION 3.5 MG (2.5MG/ML)
1.3000 mg/m2 | Freq: Once | INTRAMUSCULAR | Status: AC
  Administered 2022-04-08: 3 mg via SUBCUTANEOUS
  Filled 2022-04-08: qty 1.2

## 2022-04-08 MED ORDER — DEXAMETHASONE 4 MG PO TABS
40.0000 mg | ORAL_TABLET | Freq: Once | ORAL | Status: AC
  Administered 2022-04-08: 40 mg via ORAL
  Filled 2022-04-08: qty 10

## 2022-04-08 NOTE — Progress Notes (Signed)
[facility] [phone]   [phone]  PROGRESS NOTE  Patient Care Team: [name], MD as PCP - General (Family Medicine) Lorretta Harp, MD as PCP - Cardiology (Cardiology) Gala Romney Cristopher Estimable, MD as Attending Physician (Gastroenterology)  Hematological/Oncological History # IgG Kappa Multiple Myeloma, 1p32/1q21.  01/04/2022: MRI lumbar spine shows diffuse heterogeneous marrow signal with heterogeneous enhancement throughout the thoracolumbar spine.  Additionally, there is subacute-chronic L3 vertebral body compression fraction. 01/14/2022: establish care with Dr. Lorenso Courier  02/10/2022: bone marrow biopsy shows range of plasma cell involvement from 50% on the biopsy to 60% on aspirate smear slides and close to 90% on the clot section for an overall percentage estimated at approximately 80% overall. 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  02/26/2022: Cycle 1 of VRD therapy 04/01/2022: Cycle 2 of VRD therapy   Interval History:  [name] 51 y.o. male with medical history significant for newly diagnosed IgG kappa multiple myeloma who presents for a follow up visit. The patient's last visit was on 03/26/2022 at which time he started VRD therapy. In the interim, he was admitted for intractable low back pain. He presents today to start Cycle 2, Day 8 of VRD therapy.   On exam today [name] reports his pain is under adequate control.  He reports he is taking MS Contin 60 mg 3 times a day as well as oxycodone 20 mg 1-2 times per day.  He is not taking as much of the Tylenol.  He notes that he tends to stiffen up when sitting and he stands up and is able to stretch this out he gets better when he is moving.  He reports that he does continue to stay nauseated and his appetite is been quite poor.  He generally eats about 1 meal per day.  He is not having any vomiting or diarrhea.  He also denies any neuropathy at this time.   He denies fevers, chills, sweats, shortness of breath, chest pain or cough. He has no other complaints. A full 10 point ROS was otherwise negative.  MEDICAL HISTORY:  Past Medical History:  Diagnosis Date   Allergy    Anemia    Atypical chest pain    Cancer (HCC)    Multiple myeloma with normocytic anemia   GERD (gastroesophageal reflux disease)    Hepatic cyst    Benign by MRI   Hiatal hernia    History of colonic polyps    Hyperlipidemia    Hypertension    Rectal bleeding    Tobacco abuse     SURGICAL HISTORY: Past Surgical History:  Procedure Laterality Date   COLONOSCOPY  01/10/2009     RMR: Normal rectum, normal colon; repeat in 2015 due to [place] of colon cancer   COLONOSCOPY N/A 01/09/2014   RFF:[id number] colonic polyps-removed as described   COLONOSCOPY N/A 09/18/2016   Procedure: COLONOSCOPY;  Surgeon: Daneil Dolin, MD;  [facility];  Service: Endoscopy;  Laterality: N/A;  730    ESOPHAGOGASTRODUODENOSCOPY  10/04/2007   RMR: Distal esophageal erosions consistent with erosive reflux esophagitis, patulous gastroesophageal junction status post passage of a  Maloney dilator, 52 [place].  Otherwise, unremarkable esophagus.  Hiatal hernia.  Otherwise normal stomach.  Bulbar erosion   ESOPHAGOGASTRODUODENOSCOPY N/A 01/09/2014   Erosive reflux esophagitis. Small hiatal hernia   HUMERUS IM NAIL Left 02/22/2022   Procedure: INTRAMEDULLARY (IM) NAIL HUMERAL;  Surgeon: Nicholes Stairs, MD;  Location: [facility];  Service: Orthopedics;  Laterality: Left;   I & D EXTREMITY Left 07/26/2018   Procedure: IRRIGATION AND DEBRIDEMENT EXTREMITY;  Surgeon: Dayna Barker, MD;  Location: [facility];  Service: Plastics;  Laterality: Left;   IR BONE TUMOR(S)RF ABLATION  02/05/2022   IR BONE TUMOR(S)RF ABLATION  02/24/2022   IR KYPHO EA ADDL LEVEL THORACIC OR LUMBAR  02/19/2022   IR KYPHO LUMBAR INC FX REDUCE BONE BX UNI/BIL CANNULATION INC/IMAGING  02/05/2022   IR KYPHO LUMBAR INC FX REDUCE  BONE BX UNI/BIL CANNULATION INC/IMAGING  02/19/2022   PERCUTANEOUS PINNING Left 07/26/2018   Procedure: PERCUTANEOUS PINNING EXTREMITY;  Surgeon: Dayna Barker, MD;  Location: [facility];  Service: Plastics;  Laterality: Left;    SOCIAL HISTORY: Social History   Socioeconomic History   Marital status: Single    Spouse name: Not on file   Number of children: 2   Years of education: Not on file   Highest education level: Not on file  Occupational History   Occupation: Scientist, 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   Smoking status: Every Day    Packs/day: 0.25    Years: 10.00    Total pack years: 2.50    Types: Cigarettes   Smokeless tobacco: Never  Vaping Use   Vaping Use: Never used  Substance and Sexual Activity   Alcohol use: Yes    Alcohol/week: 0.0 standard drinks of alcohol    Comment: occasional/wine on the weekends   Drug use: No   Sexual activity: Not on file  Other Topics Concern   Not on file  Social History Narrative   Right handed   Lives alone one story home   Social Determinants of Health   Financial Resource Strain: Not on file  Food Insecurity: No Food Insecurity (03/08/2022)   Hunger Vital Sign    Worried About Running Out of Food in the Last Year: Never true    Ran Out of Food in the Last Year: Never true  Transportation Needs: No Transportation Needs (03/08/2022)   PRAPARE - Hydrologist (Medical): No    Lack of Transportation (Non-Medical): No  Physical Activity: Not on file  Stress: Not on file  Social Connections: Not on file  Intimate Partner Violence: Not At Risk (03/08/2022)   Humiliation, Afraid, Rape, and Kick questionnaire    Fear of Current or Ex-Partner: No    Emotionally Abused: No    Physically Abused: No    Sexually Abused: No    FAMILY HISTORY: Family History  Problem Relation Age of Onset   Heart disease Mother    Hypertension Mother    Diabetes  Father    Hypertension Father    Colon cancer Sister        passed away from colon cancer, in her 51s   Heart attack Maternal Uncle    Prostate cancer Maternal Uncle    Diabetes Other    Pancreatic cancer Neg Hx    Rectal cancer Neg Hx    Esophageal cancer Neg Hx    Stomach cancer Neg Hx     ALLERGIES:  has No Known Allergies.  MEDICATIONS:  Current Outpatient Medications  Medication Sig Dispense Refill   acetaminophen (TYLENOL) 500 MG tablet Take 1 tablet (500 mg total) by mouth every 8 (eight) hours as needed for mild pain or moderate pain. 30 tablet 0   acyclovir (ZOVIRAX) 400 MG tablet Take 1  tablet (400 mg total) by mouth 2 (two) times daily. 60 tablet 3   allopurinol (ZYLOPRIM) 300 MG tablet Take 1 tablet (300 mg total) by mouth daily. 30 tablet 3   amLODipine (NORVASC) 5 MG tablet Take 1 tablet (5 mg total) by mouth daily.     aspirin EC 81 MG tablet Take 81 mg by mouth daily. Swallow whole.     docusate sodium (COLACE) 100 MG capsule Take 1 capsule (100 mg total) by mouth 2 (two) times daily as needed for mild constipation. 25 capsule 0   gabapentin (NEURONTIN) 100 MG capsule Take 1 capsule (100 mg total) by mouth 2 (two) times daily. 60 capsule 1   lenalidomide (REVLIMID) 25 MG capsule Take 1 capsule (25 mg total) by mouth daily. Take for 14 days and then none for 7 days. Repeat every 21 days. [employer] Auth # [id number] Date Obtained  03/25/22 14 capsule 0   Methocarbamol 1000 MG TABS Take 1,000 mg by mouth every 6 (six) hours as needed. 45 tablet 1   morphine (MS CONTIN) 60 MG 12 hr tablet Take 1 tablet (60 mg total) by mouth every 8 (eight) hours. 90 tablet 0   ondansetron (ZOFRAN) 8 MG tablet Take 1 tablet (8 mg total) by mouth every 8 (eight) hours as needed for nausea or vomiting. 60 tablet 0   Oxycodone HCl 20 MG TABS Take 1 tablet (20 mg total) by mouth every 6 (six) hours as needed. 60 tablet 0   pantoprazole (PROTONIX) 40 MG tablet Take 1 tablet (40 mg total) by mouth 2  (two) times daily. 60 tablet 2   polyethylene glycol powder (GLYCOLAX/MIRALAX) 17 GM/SCOOP powder Take 1 capful (17 g) with water by mouth daily. 238 g 0   prochlorperazine (COMPAZINE) 10 MG tablet Take 1 tablet (10 mg total) by mouth every 6 (six) hours as needed for nausea or vomiting. 60 tablet 0   rosuvastatin (CRESTOR) 10 MG tablet Take 10 mg by mouth daily.     No current facility-administered medications for this visit.    REVIEW OF SYSTEMS:   Constitutional: ( - ) fevers, ( - )  chills , ( - ) night sweats Eyes: ( - ) blurriness of vision, ( - ) double vision, ( - ) watery eyes Ears, nose, mouth, throat, and face: ( - ) mucositis, ( - ) sore throat Respiratory: ( - ) cough, ( - ) dyspnea, ( - ) wheezes Cardiovascular: ( - ) palpitation, ( - ) chest discomfort, ( - ) lower extremity swelling Gastrointestinal:  ( - ) nausea, ( - ) heartburn, ( - ) change in bowel habits Skin: ( - ) abnormal skin rashes Lymphatics: ( - ) new lymphadenopathy, ( - ) easy bruising Neurological: ( - ) numbness, ( - ) tingling, ( - ) new weaknesses Behavioral/Psych: ( - ) mood change, ( - ) new changes  All other systems were reviewed with the patient and are negative.  PHYSICAL EXAMINATION: ECOG PERFORMANCE STATUS: 1 - Symptomatic but completely ambulatory  Vitals:   04/08/22 0910  BP: 111/67  Pulse: 96  Resp: 18  Temp: 98 F (36.7 C)  SpO2: 100%   Filed Weights   04/08/22 0910  Weight: 212 lb 12.8 oz (96.5 kg)    GENERAL: Well-appearing young African-American male, alert, no distress and comfortable SKIN: skin color, texture, turgor are normal, no rashes or significant lesions EYES: conjunctiva are pink and non-injected, sclera clear LUNGS: clear to auscultation and percussion  with normal breathing effort HEART: regular rate & rhythm and no murmurs and no lower extremity edema Musculoskeletal: no cyanosis of digits and no clubbing  PSYCH: alert & oriented x 3, fluent speech NEURO: no  focal motor/sensory deficits  LABORATORY DATA:  I have reviewed the data as listed    Latest Ref Rng & Units 04/08/2022    8:44 AM 04/02/2022    8:14 AM 03/26/2022    1:36 PM  CBC  WBC 4.0 - 10.5 K/uL 4.4  2.8  5.6   Hemoglobin 13.0 - 17.0 g/dL 10.9  10.5  10.2   Hematocrit 39.0 - 52.0 % 32.7  31.3  29.9   Platelets 150 - 400 K/uL 168  160  256        Latest Ref Rng & Units 04/08/2022    8:44 AM 04/02/2022    8:14 AM 03/26/2022    1:36 PM  CMP  Glucose 70 - 99 mg/dL 108  101  115   BUN 6 - 20 mg/dL _0 Creatinine 0.61 - 1.24 mg/dL 1.04  0.91  0.88   Sodium 135 - 145 mmol/L 130  133  131   Potassium 3.5 - 5.1 mmol/L 3.3  3.9  4.4   Chloride 98 - 111 mmol/L 96  98  99   CO2 22 - 32 mmol/L _1 Calcium 8.9 - 10.3 mg/dL 9.2  9.3  9.3   Total Protein 6.5 - 8.1 g/dL 8.6  8.5  9.4   Total Bilirubin 0.3 - 1.2 mg/dL 0.7  0.7  0.4   Alkaline Phos 38 - 126 U/L 171  103  101   AST 15 - 41 U/L _2 ALT 0 - 44 U/L 53  27  28     Lab Results  Component Value Date   MPROTEIN 1.9 (H) 04/02/2022   MPROTEIN 3.1 (H) 01/14/2022   Lab Results  Component Value Date   KPAFRELGTCHN 90.1 (H) 04/02/2022   KPAFRELGTCHN 92.0 (H) 01/14/2022   LAMBDASER 11.7 04/02/2022   LAMBDASER 9.4 01/14/2022   KAPLAMBRATIO 7.70 (H) 04/02/2022   KAPLAMBRATIO 57.71 (H) 01/17/2022   KAPLAMBRATIO 9.79 (H) 01/14/2022   RADIOGRAPHIC STUDIES: DG Humerus Left  Result Date: 03/17/2022 CLINICAL DATA:  Postoperative pain; history of multiple myeloma EXAM: LEFT HUMERUS - 2+ VIEW COMPARISON:  02/22/2022 FINDINGS: IM nail with multiple proximal and single distal locking screws across an oblique slightly displaced fracture of the proximal LEFT humeral diaphysis. Degree of displacement at the fracture has slightly increased from the previous exam Endosteal scalloping at the level of the fracture consistent with pathologic fracture. Glenohumeral and elbow joint alignments normal. IMPRESSION: Post  nailing of LEFT humerus with slightly increased displacement at pathologic fracture of the proximal LEFT humerus. Electronically Signed   By: Lavonia Dana M.D.   On: 03/17/2022 13:39   US RENAL  Result Date: 03/11/2022 CLINICAL DATA:  Acute kidney injury EXAM: RENAL / URINARY TRACT ULTRASOUND COMPLETE COMPARISON:  No prior dedicated renal ultrasound available FINDINGS: Right Kidney: Renal measurements: 11.9 x 5.7 x 5.6 cm = volume: 197 mL. Echogenicity within normal limits. No mass or hydronephrosis visualized. Left Kidney: Renal measurements: 11.8 x 5.8 x 5.0 cm = volume: 176 mL. Echogenicity within normal limits. No mass or hydronephrosis visualized. Bladder: Appears normal for degree of bladder distention. No bladder jets visualized. Other: None. IMPRESSION: No significant sonographic abnormality of the kidneys.  Electronically Signed   By: Merilyn Baba M.D.   On: 03/11/2022 18:28    ASSESSMENT & PLAN [name] 51 y.o. male with medical history significant for newly diagnosed IgG kappa multiple myeloma who presents for a follow up visit.  After review of the labs, review the records, discussion with the patient the findings are most consistent with newly diagnosed multiple myeloma.  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  At this time would recommend proceeding with VRD chemotherapy with the intention of referral for ASCT when his labs are appropriate.  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 -- Recommend VRD chemotherapy with intention of proceeding to transplant. -- Labs at each visit to include CBC, CMP, LDH with monthly restaging labs SPEP and serum free light chains -- Started VRD therapy on 02/26/2022.  PLAN: --Presents today to start Cycle 2, Day 8 of VRD therapy --Labs from today  were reviewed and adequate for treatment. WBC 4.4, hemoglobin 10.9, MCV 86.7, and platelets of 168.   --Proceed with treatment today as planned without any dose modifications --Continue with weekly treatments and follow up in 2 weeks.   #Pathologic fractures-secondary to MM: --Involving L1, L2 and L3 compression fracture and left humerus fracture.  --Underwent kyphoplasty of L3 fracture on 02/19/2022 --Underwent medullary nailing of left humeral shaft on 02/22/2022  #Pain Medication -- Per palliative care continue on MS contin 60 mg q 8 hours, oxycodone 20 mg q 6 hours, change robaxin 1000 mg QID.  --Appreciate assistance of palliative care NP for further management of pain and other chronic symptoms.   #Supportive Care -- chemotherapy education complete -- port placement not required -- zofran 59m q8H PRN and compazine 163mPO q6H for nausea -- acyclovir 40035mO BID for VCZ prophylaxis -- allopurinol 300m3m daily for TLS prophylaxis -- Dental clearance for Zometa/Denosumab approved, requested at least 6 weeks from dental work on 02/25/2022. OK to proceed with Denosumab at next visit.   No orders of the defined types were placed in this encounter.   All questions were answered. The patient knows to call the clinic with any problems, questions or concerns.  I have spent a total of 30 minutes minutes of face-to-face and non-face-to-face time, preparing to see the patiHuntingdonedically appropriate examination, counseling and educating the patient, ordering medications/tests, documenting clinical information in the electronic health record,  and care coordination.   JohnLedell Peoples Department of Hematology/Oncology [facility]: [phone]: [phone]: [email or web address]   04/08/2022 1:45 PM

## 2022-04-08 NOTE — Patient Instructions (Signed)
[facility]COL[facility]  Discharge Instructions: Thank you for choosing [facility] to provide your oncology and hematology care.   If you have a lab appointment with the [facility], please go directly to the [facility] and check in at the registration area.   Wear comfortable clothing and clothing appropriate for easy access to any Portacath or PICC line.   We strive to give you quality time with your provider. You may need to reschedule your appointment if you arrive late (15 or more minutes).  Arriving late affects you and other patients whose appointments are after yours.  Also, if you miss three or more appointments without notifying the office, you may be dismissed from the clinic at the provider's discretion.      For prescription refill requests, have your pharmacy contact our office and allow 72 hours for refills to be completed.    Today you received the following chemotherapy and/or immunotherapy agents: Velcade      To help prevent nausea and vomiting after your treatment, we encourage you to take your nausea medication as directed.  BELOW ARE SYMPTOMS THAT SHOULD BE REPORTED IMMEDIATELY: *FEVER GREATER THAN 100.4 F (38 C) OR HIGHER *CHILLS OR SWEATING *NAUSEA AND VOMITING THAT IS NOT CONTROLLED WITH YOUR NAUSEA MEDICATION *UNUSUAL SHORTNESS OF BREATH *UNUSUAL BRUISING OR BLEEDING *URINARY PROBLEMS (pain or burning when urinating, or frequent urination) *BOWEL PROBLEMS (unusual diarrhea, constipation, pain near the anus) TENDERNESS IN MOUTH AND THROAT WITH OR WITHOUT PRESENCE OF ULCERS (sore throat, sores in mouth, or a toothache) UNUSUAL RASH, SWELLING OR PAIN  UNUSUAL VAGINAL DISCHARGE OR ITCHING   Items with * indicate a potential emergency and should be followed up as soon as possible or go to the Emergency Department if any problems should occur.  Please show the CHEMOTHERAPY ALERT CARD or IMMUNOTHERAPY ALERT CARD at check-in to  the Emergency Department and triage nurse.  Should you have questions after your visit or need to cancel or reschedule your appointment, please contact [facility]  Dept: [phone]  and follow the prompts.  Office hours are 8:00 a.m. to 4:30 p.m. Monday - Friday. Please note that voicemails left after 4:00 p.m. may not be returned until the following business day.  We are closed weekends and major holidays. You have access to a nurse at all times for urgent questions. Please call the main number to the clinic Dept: [phone] and follow the prompts.   For any non-urgent questions, you may also contact your provider using MyChart. We now offer e-Visits for anyone 29 and older to request care online for non-urgent symptoms. For details visit [email or web address].   Also download the MyChart app! Go to the app store, search "MyChart", open the app, select [facility], and log in with your MyChart username and password.  Masks are optional in the cancer centers. If you would like for your care team to wear a mask while they are taking care of you, please let them know. You may have one support [name] who is at least 53 years old accompany you for your appointments.

## 2022-04-09 DIAGNOSIS — I10 Essential (primary) hypertension: Secondary | ICD-10-CM | POA: Diagnosis not present

## 2022-04-09 DIAGNOSIS — E782 Mixed hyperlipidemia: Secondary | ICD-10-CM | POA: Diagnosis not present

## 2022-04-09 DIAGNOSIS — G35 Multiple sclerosis: Secondary | ICD-10-CM | POA: Diagnosis not present

## 2022-04-09 DIAGNOSIS — G629 Polyneuropathy, unspecified: Secondary | ICD-10-CM | POA: Diagnosis not present

## 2022-04-09 DIAGNOSIS — T402X5D Adverse effect of other opioids, subsequent encounter: Secondary | ICD-10-CM | POA: Diagnosis not present

## 2022-04-09 DIAGNOSIS — K219 Gastro-esophageal reflux disease without esophagitis: Secondary | ICD-10-CM | POA: Diagnosis not present

## 2022-04-09 DIAGNOSIS — S32000S Wedge compression fracture of unspecified lumbar vertebra, sequela: Secondary | ICD-10-CM | POA: Diagnosis not present

## 2022-04-09 DIAGNOSIS — D630 Anemia in neoplastic disease: Secondary | ICD-10-CM | POA: Diagnosis not present

## 2022-04-09 DIAGNOSIS — M8458XD Pathological fracture in neoplastic disease, other specified site, subsequent encounter for fracture with routine healing: Secondary | ICD-10-CM | POA: Diagnosis not present

## 2022-04-09 DIAGNOSIS — C9000 Multiple myeloma not having achieved remission: Secondary | ICD-10-CM | POA: Diagnosis not present

## 2022-04-09 DIAGNOSIS — M4802 Spinal stenosis, cervical region: Secondary | ICD-10-CM | POA: Diagnosis not present

## 2022-04-09 DIAGNOSIS — F17210 Nicotine dependence, cigarettes, uncomplicated: Secondary | ICD-10-CM | POA: Diagnosis not present

## 2022-04-09 DIAGNOSIS — M109 Gout, unspecified: Secondary | ICD-10-CM | POA: Diagnosis not present

## 2022-04-09 DIAGNOSIS — K5903 Drug induced constipation: Secondary | ICD-10-CM | POA: Diagnosis not present

## 2022-04-09 DIAGNOSIS — G894 Chronic pain syndrome: Secondary | ICD-10-CM | POA: Diagnosis not present

## 2022-04-09 DIAGNOSIS — N179 Acute kidney failure, unspecified: Secondary | ICD-10-CM | POA: Diagnosis not present

## 2022-04-09 DIAGNOSIS — S42202D Unspecified fracture of upper end of left humerus, subsequent encounter for fracture with routine healing: Secondary | ICD-10-CM | POA: Diagnosis not present

## 2022-04-09 NOTE — Telephone Encounter (Unsigned)
Copied from [place] [phone]. Topic: Quick Communication - Home Health Verbal Orders >> Apr 08, 2022  1:30 PM Everette C wrote: Caller/Agency: [facility] Number: [phone] Requesting OT/PT/Skilled Nursing/Social Work/Speech Therapy: PT Frequency: 2w1 1w7

## 2022-04-09 NOTE — Progress Notes (Deleted)
Patient ID: [name], male    DOB: 07/21/1970  MRN: [id number]  CC: [name] Discharge Follow-Up  Subjective: [name] is a 51 y.o. male who presents for [name] discharge follow-up.  His concerns today include:  03/07/2022 - 03/17/2022 [facility] per MD note: Recommendations at discharge:    Patient to follow-up with his oncologist Dr. Lorenso [name] in approximately 1 week. Patient to follow-up with his primary care provider Dr. Dorna [name] in 1 to 2 weeks. Patient to follow-up with his orthopedic surgeon Dr. Stann [name] 4 weeks after discharge.   4 Discharge Diagnoses: Principal Problem:   Intractable low back pain Active Problems:   Traumatic compression fracture of lumbar vertebra (HCC)   Constipation due to opioid therapy   Closed fracture of proximal end of left humerus   Multiple myeloma not having achieved remission (HCC)   AKI (acute kidney injury) ([place])   Essential hypertension   GERD without esophagitis   Mixed hyperlipidemia   Normocytic anemia   Palliative care by specialist   Resolved Problems:   * No resolved [name] problems. *   [name] Course: [name] is a 51 y.o. male with medical history significant for IgG kappa multiple myeloma on active treatment with Velcade/Revlimid, pathologic left humeral fracture (s/p IM nail 02/22/2022) and L1, L2, L3 compression fractures s/p radiofrequency ablation and kyphoplasty with L1-L2 on 02/19/2022 and L3 on 02/05/2022, HTN, anemia of chronic disease who is admitted with intractable lower back pain.   Patient remained in the [name] service for intractable pain with titration of opiate-based analgesics and muscle relaxants with the assistance of the palliative care service.  These medications were titrated to good effect allowing the patient to work with physical therapy.   Physical therapy did begin to work with the patient and recommended the patient would benefit from short-term skilled rehab.      Throughout the hospitalization, Dr. Lorenso [name] with oncology provided guidance on management of patient's Revlimid and recommended initiation of aspirin 81 mg daily at time of discharge with plans for him to follow-up closely with Dr. Lorenso [name] at time of discharge.   As the patient clinically improved range made for the patient to be discharged to a skilled nursing facility.  Patient was discharged in improved and stable condition on 03/17/2022    Pain control - [name] Controlled Substance Reporting System database was reviewed. and patient was instructed, not to drive, operate heavy machinery, perform activities at heights, swimming or participation in water activities or provide baby-sitting services while on Pain, Sleep and Anxiety Medications; until their outpatient Physician has advised to do so again. Also recommended to not to take more than prescribed Pain, Sleep and Anxiety Medications.   Follow-Ups  Schedule an appointment with Christopher Slick, MD (Hematology and Oncology) in 1 week (03/24/2022) Schedule an appointment with Ortho[facility] (Specialist) in 4 weeks (04/14/2022) Follow up with [name] (Radiology)  Today's visit 04/11/2022: Established with [facility] [place], Christopher Shown, MD. Message sent to them requesting OT/PT/Skilled Nursing/SW/Speech therapy. Needs to schedule f/u appt. Last appt with Heme Onc 04/08/2022  STOP taking these medications     hydrochlorothiazide 25 MG tablet Commonly known as: HYDRODIURIL    ibuprofen 600 MG tablet Commonly known as: ADVIL    lisinopril 40 MG tablet Commonly known as: ZESTRIL    metoprolol tartrate 50 MG tablet Commonly known as: LOPRESSOR           TAKE these  medications     Acetaminophen Extra Strength 500 MG Tabs Take 1 tablet (500 mg total) by mouth every 8 (eight) hours as needed for mild pain or moderate pain.    acyclovir 400 MG tablet Commonly known as:  ZOVIRAX Take 1 tablet (400 mg total) by mouth 2 (two) times daily.    allopurinol 300 MG tablet Commonly known as: ZYLOPRIM Take 1 tablet (300 mg total) by mouth daily.    amLODipine 5 MG tablet Commonly known as: NORVASC Take 1 tablet (5 mg total) by mouth daily. Start taking on: March 18, 2022    aspirin EC 81 MG tablet Take 81 mg by mouth daily. Swallow whole.    docusate sodium 100 MG capsule Commonly known as: COLACE Take 1 capsule (100 mg total) by mouth 2 (two) times daily as needed for mild constipation.    gabapentin 100 MG capsule Commonly known as: NEURONTIN Take 1 capsule (100 mg total) by mouth at bedtime.    lenalidomide 25 MG capsule Commonly known as: REVLIMID Take 1 capsule (25 mg total) by mouth daily. Take for 14 days and then none for 7 days. Repeat every 21 days. [employer] Auth # [id number] Date Obtained 03/05/22    methocarbamol 500 MG tablet Commonly known as: ROBAXIN Take 500 mg by mouth 3 (three) times daily. What changed: Another medication with the same name was added. Make sure you understand how and when to take each.    Methocarbamol 1000 MG Tabs Take 1,000 mg by mouth every 6 (six) hours as needed for muscle spasms. What changed: You were already taking a medication with the same name, and this prescription was added. Make sure you understand how and when to take each.    morphine 60 MG 12 hr tablet Commonly known as: MS CONTIN Take 1 tablet (60 mg total) by mouth every 8 (eight) hours for 3 days. What changed:  medication strength when to take this    ondansetron 8 MG tablet Commonly known as: ZOFRAN Take 1 tablet (8 mg total) by mouth every 8 (eight) hours as needed for nausea or vomiting.    Oxycodone HCl 20 MG Tabs Take 1 tablet (20 mg total) by mouth every 6 (six) hours for 3 days. What changed:  medication strength how much to take when to take this reasons to take this    pantoprazole 40 MG tablet Commonly known as:  PROTONIX Take 1 tablet (40 mg total) by mouth daily.    polyethylene glycol powder 17 GM/SCOOP powder Commonly known as: GLYCOLAX/MIRALAX Take 1 capful (17 g) with water by mouth daily.    prochlorperazine 10 MG tablet Commonly known as: COMPAZINE Take 1 tablet (10 mg total) by mouth every 6 (six) hours as needed for nausea or vomiting.    rosuvastatin 10 MG tablet Commonly known as: CRESTOR Take 10 mg by mouth daily.       Patient Active Problem List   Diagnosis Date Noted   Constipation due to opioid therapy 03/16/2022   Closed fracture of proximal end of left humerus 03/16/2022   Palliative care by specialist 03/15/2022   AKI (acute kidney injury) ([place]) 03/11/2022   Intractable low back pain 03/07/2022   Pathologic fracture 02/17/2022   Traumatic compression fracture of lumbar vertebra ([place]) 02/17/2022   Normocytic anemia 02/17/2022   Hyponatremia 02/17/2022   Fall at home, initial encounter 02/17/2022   Obesity (BMI 30-39.9) 02/17/2022   Multiple myeloma ([place]) 02/16/2022   Multiple myeloma not having achieved  remission ([place]) 02/16/2022   Mixed hyperlipidemia 02/17/2019   Lesion of liver 09/23/2017   GERD (gastroesophageal reflux disease) 09/23/2017   Atypical chest pain 05/01/2016   Abnormal ultrasound 07/19/2015   ALCOHOL ABUSE 09/06/2008   TOBACCO ABUSE 09/06/2008   Essential hypertension 09/06/2008   GERD without esophagitis 09/06/2008     Current Outpatient Medications on File Prior to Visit  Medication Sig Dispense Refill   acetaminophen (TYLENOL) 500 MG tablet Take 1 tablet (500 mg total) by mouth every 8 (eight) hours as needed for mild pain or moderate pain. 30 tablet 0   acyclovir (ZOVIRAX) 400 MG tablet Take 1 tablet (400 mg total) by mouth 2 (two) times daily. 60 tablet 3   allopurinol (ZYLOPRIM) 300 MG tablet Take 1 tablet (300 mg total) by mouth daily. 30 tablet 3   amLODipine (NORVASC) 5 MG tablet Take 1 tablet (5 mg total) by mouth daily.      aspirin EC 81 MG tablet Take 81 mg by mouth daily. Swallow whole.     docusate sodium (COLACE) 100 MG capsule Take 1 capsule (100 mg total) by mouth 2 (two) times daily as needed for mild constipation. 25 capsule 0   gabapentin (NEURONTIN) 100 MG capsule Take 1 capsule (100 mg total) by mouth 2 (two) times daily. 60 capsule 1   lenalidomide (REVLIMID) 25 MG capsule Take 1 capsule (25 mg total) by mouth daily. Take for 14 days and then none for 7 days. Repeat every 21 days. [employer] Auth # [id number] Date Obtained  03/25/22 14 capsule 0   Methocarbamol 1000 MG TABS Take 1,000 mg by mouth every 6 (six) hours as needed. 45 tablet 1   morphine (MS CONTIN) 60 MG 12 hr tablet Take 1 tablet (60 mg total) by mouth every 8 (eight) hours. 90 tablet 0   ondansetron (ZOFRAN) 8 MG tablet Take 1 tablet (8 mg total) by mouth every 8 (eight) hours as needed for nausea or vomiting. 60 tablet 0   Oxycodone HCl 20 MG TABS Take 1 tablet (20 mg total) by mouth every 6 (six) hours as needed. 60 tablet 0   pantoprazole (PROTONIX) 40 MG tablet Take 1 tablet (40 mg total) by mouth 2 (two) times daily. 60 tablet 2   polyethylene glycol powder (GLYCOLAX/MIRALAX) 17 GM/SCOOP powder Take 1 capful (17 g) with water by mouth daily. 238 g 0   prochlorperazine (COMPAZINE) 10 MG tablet Take 1 tablet (10 mg total) by mouth every 6 (six) hours as needed for nausea or vomiting. 60 tablet 0   rosuvastatin (CRESTOR) 10 MG tablet Take 10 mg by mouth daily.     No current facility-administered medications on file prior to visit.    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   Smoking status: Every Day    Packs/day: 0.25    Years:  10.00    Total pack years: 2.50    Types: Cigarettes   Smokeless tobacco: Never  Vaping Use   Vaping Use: Never used  Substance and Sexual Activity   Alcohol use: Yes    Alcohol/week: 0.0 standard drinks of alcohol  Comment: occasional/wine on the weekends   Drug use: No   Sexual activity: Not on file  Other Topics Concern   Not on file  Social History Narrative   Right handed   Lives alone one story home   Social Determinants of Health   Financial Resource Strain: Not on file  Food Insecurity: No Food Insecurity (03/08/2022)   Hunger Vital Sign    Worried About Running Out of Food in the Last Year: Never true    Ran Out of Food in the Last Year: Never true  Transportation Needs: No Transportation Needs (03/08/2022)   PRAPARE - Hydrologist (Medical): No    Lack of Transportation (Non-Medical): No  Physical Activity: Not on file  Stress: Not on file  Social Connections: Not on file  Intimate Partner Violence: Not At Risk (03/08/2022)   Humiliation, Afraid, Rape, and Kick questionnaire    Fear of Current or Ex-Partner: No    Emotionally Abused: No    Physically Abused: No    Sexually Abused: No    Family History  Problem Relation Age of Onset   Heart disease Mother    Hypertension Mother    Diabetes Father    Hypertension Father    Colon cancer Sister        passed away from colon cancer, in her 66s   Heart attack Maternal Uncle    Prostate cancer Maternal Uncle    Diabetes Other    Pancreatic cancer Neg Hx    Rectal cancer Neg Hx    Esophageal cancer Neg Hx    Stomach cancer Neg Hx     Past Surgical History:  Procedure Laterality Date   COLONOSCOPY  01/10/2009     RMR: Normal rectum, normal colon; repeat in 2015 due to Maria Antonia of colon cancer   COLONOSCOPY N/A 01/09/2014   [id number] colonic polyps-removed as described   COLONOSCOPY N/A 09/18/2016   Procedure: COLONOSCOPY;  Surgeon: Daneil Dolin, MD;  [facility];   Service: Endoscopy;  Laterality: N/A;  730    ESOPHAGOGASTRODUODENOSCOPY  10/04/2007   RMR: Distal esophageal erosions consistent with erosive reflux esophagitis, patulous gastroesophageal junction status post passage of a  Maloney dilator, 39 [place].  Otherwise, unremarkable esophagus.  Hiatal hernia.  Otherwise normal stomach.  Bulbar erosion   ESOPHAGOGASTRODUODENOSCOPY N/A 01/09/2014   Erosive reflux esophagitis. Small hiatal hernia   HUMERUS IM NAIL Left 02/22/2022   Procedure: INTRAMEDULLARY (IM) NAIL HUMERAL;  Surgeon: Nicholes Stairs, MD;  Location: [facility];  Service: Orthopedics;  Laterality: Left;   I & D EXTREMITY Left 07/26/2018   Procedure: IRRIGATION AND DEBRIDEMENT EXTREMITY;  Surgeon: Dayna Barker, MD;  Location: [facility];  Service: Plastics;  Laterality: Left;   IR BONE TUMOR(S)RF ABLATION  02/05/2022   IR BONE TUMOR(S)RF ABLATION  02/24/2022   IR KYPHO EA ADDL LEVEL THORACIC OR LUMBAR  02/19/2022   IR KYPHO LUMBAR INC FX REDUCE BONE BX UNI/BIL CANNULATION INC/IMAGING  02/05/2022   IR KYPHO LUMBAR INC FX REDUCE BONE BX UNI/BIL CANNULATION INC/IMAGING  02/19/2022   PERCUTANEOUS PINNING Left 07/26/2018   Procedure: PERCUTANEOUS PINNING EXTREMITY;  Surgeon: Dayna Barker, MD;  Location: [facility];  Service: Plastics;  Laterality: Left;    ROS: Review of Systems Negative except as stated above  PHYSICAL EXAM: There were no vitals taken for this visit.  Physical Exam  {male adult master:[id number]} {male adult master:[id number]}     Latest Ref Rng & Units  04/08/2022    8:44 AM 04/02/2022    8:14 AM 03/26/2022    1:36 PM  CMP  Glucose 70 - 99 mg/dL 108  101  115   BUN 6 - 20 mg/dL _0 Creatinine 0.61 - 1.24 mg/dL 1.04  0.91  0.88   Sodium 135 - 145 mmol/L 130  133  131   Potassium 3.5 - 5.1 mmol/L 3.3  3.9  4.4   Chloride 98 - 111 mmol/L 96  98  99   CO2 22 - 32 mmol/L _1 Calcium 8.9 - 10.3 mg/dL 9.2  9.3  9.3   Total Protein 6.5 - 8.1 g/dL 8.6  8.5  9.4   Total  Bilirubin 0.3 - 1.2 mg/dL 0.7  0.7  0.4   Alkaline Phos 38 - 126 U/L 171  103  101   AST 15 - 41 U/L _2 ALT 0 - 44 U/L 53  27  28    Lipid Panel     Component Value Date/Time   CHOL 183 12/05/2021 1556   TRIG 540 (H) 12/05/2021 1556   HDL 31 (L) 12/05/2021 1556   CHOLHDL 5.9 (H) 12/05/2021 1556   CHOLHDL 8.5 06/23/2008 0810   VLDL 31 06/23/2008 0810   LDLCALC 68 12/05/2021 1556    CBC    Component Value Date/Time   WBC 4.4 04/08/2022 0844   WBC 5.1 03/17/2022 0337   RBC 3.77 (L) 04/08/2022 0844   HGB 10.9 (L) 04/08/2022 0844   HGB 13.5 12/05/2021 1556   HCT 32.7 (L) 04/08/2022 0844   HCT 40.6 12/05/2021 1556   PLT 168 04/08/2022 0844   PLT 238 12/05/2021 1556   MCV 86.7 04/08/2022 0844   MCV 87 12/05/2021 1556   MCH 28.9 04/08/2022 0844   MCHC 33.3 04/08/2022 0844   RDW 13.7 04/08/2022 0844   RDW 13.3 12/05/2021 1556   LYMPHSABS 1.3 04/08/2022 0844   LYMPHSABS 3.7 (H) 12/05/2021 1556   MONOABS 0.5 04/08/2022 0844   EOSABS 0.0 04/08/2022 0844   EOSABS 0.2 12/05/2021 1556   BASOSABS 0.0 04/08/2022 0844   BASOSABS 0.0 12/05/2021 1556    ASSESSMENT AND PLAN:  There are no diagnoses linked to this encounter.   Patient was given the opportunity to ask questions.  Patient verbalized understanding of the plan and was able to repeat key elements of the plan. Patient was given clear instructions to go to Emergency Department or return to medical center if symptoms don't improve, worsen, or new problems develop.The patient verbalized understanding.   No orders of the defined types were placed in this encounter.    Requested Prescriptions    No prescriptions requested or ordered in this encounter    No follow-ups on file.  Camillia Herter, NP

## 2022-04-10 ENCOUNTER — Other Ambulatory Visit: Payer: Self-pay

## 2022-04-10 ENCOUNTER — Encounter: Payer: Self-pay | Admitting: Orthopaedic Surgery

## 2022-04-10 ENCOUNTER — Other Ambulatory Visit (HOSPITAL_COMMUNITY): Payer: Self-pay

## 2022-04-10 DIAGNOSIS — E782 Mixed hyperlipidemia: Secondary | ICD-10-CM | POA: Diagnosis not present

## 2022-04-10 DIAGNOSIS — M4802 Spinal stenosis, cervical region: Secondary | ICD-10-CM | POA: Diagnosis not present

## 2022-04-10 DIAGNOSIS — G35 Multiple sclerosis: Secondary | ICD-10-CM | POA: Diagnosis not present

## 2022-04-10 DIAGNOSIS — D630 Anemia in neoplastic disease: Secondary | ICD-10-CM | POA: Diagnosis not present

## 2022-04-10 DIAGNOSIS — S42202D Unspecified fracture of upper end of left humerus, subsequent encounter for fracture with routine healing: Secondary | ICD-10-CM | POA: Diagnosis not present

## 2022-04-10 DIAGNOSIS — K5903 Drug induced constipation: Secondary | ICD-10-CM | POA: Diagnosis not present

## 2022-04-10 DIAGNOSIS — T402X5D Adverse effect of other opioids, subsequent encounter: Secondary | ICD-10-CM | POA: Diagnosis not present

## 2022-04-10 DIAGNOSIS — M109 Gout, unspecified: Secondary | ICD-10-CM | POA: Diagnosis not present

## 2022-04-10 DIAGNOSIS — N179 Acute kidney failure, unspecified: Secondary | ICD-10-CM | POA: Diagnosis not present

## 2022-04-10 DIAGNOSIS — K219 Gastro-esophageal reflux disease without esophagitis: Secondary | ICD-10-CM | POA: Diagnosis not present

## 2022-04-10 DIAGNOSIS — G629 Polyneuropathy, unspecified: Secondary | ICD-10-CM | POA: Diagnosis not present

## 2022-04-10 DIAGNOSIS — F17210 Nicotine dependence, cigarettes, uncomplicated: Secondary | ICD-10-CM | POA: Diagnosis not present

## 2022-04-10 DIAGNOSIS — S32000S Wedge compression fracture of unspecified lumbar vertebra, sequela: Secondary | ICD-10-CM | POA: Diagnosis not present

## 2022-04-10 DIAGNOSIS — M8458XD Pathological fracture in neoplastic disease, other specified site, subsequent encounter for fracture with routine healing: Secondary | ICD-10-CM | POA: Diagnosis not present

## 2022-04-10 DIAGNOSIS — I10 Essential (primary) hypertension: Secondary | ICD-10-CM | POA: Diagnosis not present

## 2022-04-10 DIAGNOSIS — C9000 Multiple myeloma not having achieved remission: Secondary | ICD-10-CM | POA: Diagnosis not present

## 2022-04-10 DIAGNOSIS — G894 Chronic pain syndrome: Secondary | ICD-10-CM | POA: Diagnosis not present

## 2022-04-11 ENCOUNTER — Ambulatory Visit: Payer: BC Managed Care – PPO | Admitting: Family

## 2022-04-11 DIAGNOSIS — C9000 Multiple myeloma not having achieved remission: Secondary | ICD-10-CM

## 2022-04-11 DIAGNOSIS — D649 Anemia, unspecified: Secondary | ICD-10-CM

## 2022-04-11 DIAGNOSIS — M5459 Other low back pain: Secondary | ICD-10-CM

## 2022-04-11 DIAGNOSIS — N179 Acute kidney failure, unspecified: Secondary | ICD-10-CM

## 2022-04-11 DIAGNOSIS — I10 Essential (primary) hypertension: Secondary | ICD-10-CM

## 2022-04-11 DIAGNOSIS — Z09 Encounter for follow-up examination after completed treatment for conditions other than malignant neoplasm: Secondary | ICD-10-CM

## 2022-04-11 DIAGNOSIS — T402X5A Adverse effect of other opioids, initial encounter: Secondary | ICD-10-CM

## 2022-04-11 DIAGNOSIS — S42202D Unspecified fracture of upper end of left humerus, subsequent encounter for fracture with routine healing: Secondary | ICD-10-CM

## 2022-04-11 DIAGNOSIS — E782 Mixed hyperlipidemia: Secondary | ICD-10-CM

## 2022-04-11 DIAGNOSIS — Z515 Encounter for palliative care: Secondary | ICD-10-CM

## 2022-04-11 DIAGNOSIS — S32009D Unspecified fracture of unspecified lumbar vertebra, subsequent encounter for fracture with routine healing: Secondary | ICD-10-CM

## 2022-04-11 DIAGNOSIS — K219 Gastro-esophageal reflux disease without esophagitis: Secondary | ICD-10-CM

## 2022-04-14 ENCOUNTER — Encounter: Payer: Self-pay | Admitting: Orthopaedic Surgery

## 2022-04-14 DIAGNOSIS — E782 Mixed hyperlipidemia: Secondary | ICD-10-CM | POA: Diagnosis not present

## 2022-04-14 DIAGNOSIS — F17210 Nicotine dependence, cigarettes, uncomplicated: Secondary | ICD-10-CM | POA: Diagnosis not present

## 2022-04-14 DIAGNOSIS — K219 Gastro-esophageal reflux disease without esophagitis: Secondary | ICD-10-CM | POA: Diagnosis not present

## 2022-04-14 DIAGNOSIS — K5903 Drug induced constipation: Secondary | ICD-10-CM | POA: Diagnosis not present

## 2022-04-14 DIAGNOSIS — M8458XD Pathological fracture in neoplastic disease, other specified site, subsequent encounter for fracture with routine healing: Secondary | ICD-10-CM | POA: Diagnosis not present

## 2022-04-14 DIAGNOSIS — D630 Anemia in neoplastic disease: Secondary | ICD-10-CM | POA: Diagnosis not present

## 2022-04-14 DIAGNOSIS — S42202D Unspecified fracture of upper end of left humerus, subsequent encounter for fracture with routine healing: Secondary | ICD-10-CM | POA: Diagnosis not present

## 2022-04-14 DIAGNOSIS — C9000 Multiple myeloma not having achieved remission: Secondary | ICD-10-CM | POA: Diagnosis not present

## 2022-04-14 DIAGNOSIS — G629 Polyneuropathy, unspecified: Secondary | ICD-10-CM | POA: Diagnosis not present

## 2022-04-14 DIAGNOSIS — M109 Gout, unspecified: Secondary | ICD-10-CM | POA: Diagnosis not present

## 2022-04-14 DIAGNOSIS — G894 Chronic pain syndrome: Secondary | ICD-10-CM | POA: Diagnosis not present

## 2022-04-14 DIAGNOSIS — I10 Essential (primary) hypertension: Secondary | ICD-10-CM | POA: Diagnosis not present

## 2022-04-14 DIAGNOSIS — G35 Multiple sclerosis: Secondary | ICD-10-CM | POA: Diagnosis not present

## 2022-04-14 DIAGNOSIS — N179 Acute kidney failure, unspecified: Secondary | ICD-10-CM | POA: Diagnosis not present

## 2022-04-14 DIAGNOSIS — T402X5D Adverse effect of other opioids, subsequent encounter: Secondary | ICD-10-CM | POA: Diagnosis not present

## 2022-04-14 DIAGNOSIS — M4802 Spinal stenosis, cervical region: Secondary | ICD-10-CM | POA: Diagnosis not present

## 2022-04-15 NOTE — Progress Notes (Unsigned)
Patient ID: [name], male    DOB: 1970/10/08  MRN: [id number]  CC: Hospital Discharge Follow-Up  Subjective: [name] is a 51 y.o. male who presents for hospital discharge follow-up.  His concerns today include:  03/07/2022 - 03/17/2022 [facility] per MD note: Recommendations at discharge:    Patient to follow-up with his oncologist Dr. Lorenso Courier in approximately 1 week. Patient to follow-up with his primary care provider Dr. [name] in 1 to 2 weeks. Patient to follow-up with his orthopedic surgeon Dr. Stann Mainland 4 weeks after discharge.   4 Discharge Diagnoses: Principal Problem:   Intractable low back pain Active Problems:   Traumatic compression fracture of lumbar vertebra (HCC)   Constipation due to opioid therapy   Closed fracture of proximal end of left humerus   Multiple myeloma not having achieved remission (HCC)   AKI (acute kidney injury) ([place])   Essential hypertension   GERD without esophagitis   Mixed hyperlipidemia   Normocytic anemia   Palliative care by specialist   Resolved Problems:   * No resolved hospital problems. *   Hospital Course: [name] is a 51 y.o. male with medical history significant for IgG kappa multiple myeloma on active treatment with Velcade/Revlimid, pathologic left humeral fracture (s/p IM nail 02/22/2022) and L1, L2, L3 compression fractures s/p radiofrequency ablation and kyphoplasty with L1-L2 on 02/19/2022 and L3 on 02/05/2022, HTN, anemia of chronic disease who is admitted with intractable lower back pain.   Patient remained in the hospital service for intractable pain with titration of opiate-based analgesics and muscle relaxants with the assistance of the palliative care service.  These medications were titrated to good effect allowing the patient to work with physical therapy.   Physical therapy did begin to work with the patient and recommended the patient would benefit from short-term skilled rehab.      Throughout the hospitalization, Dr. Lorenso Courier with oncology provided guidance on management of patient's Revlimid and recommended initiation of aspirin 81 mg daily at time of discharge with plans for him to follow-up closely with Dr. Lorenso Courier at time of discharge.   As the patient clinically improved range made for the patient to be discharged to a skilled nursing facility.  Patient was discharged in improved and stable condition on 03/17/2022    Pain control - [facility] Controlled Substance Reporting System database was reviewed. and patient was instructed, not to drive, operate heavy machinery, perform activities at heights, swimming or participation in water activities or provide baby-sitting services while on Pain, Sleep and Anxiety Medications; until their outpatient Physician has advised to do so again. Also recommended to not to take more than prescribed Pain, Sleep and Anxiety Medications.   Follow-Ups  Schedule an appointment with Orson Slick, MD (Hematology and Oncology) in 1 week (03/24/2022) Schedule an appointment with [facility] (Specialist) in 4 weeks (04/14/2022) Follow up with [facility] (Radiology)  Today's visit 04/11/2022: Established with [facility] [place], Frankey Shown, MD. Message sent to them requesting OT/PT/Skilled Nursing/SW/Speech therapy. Needs to schedule f/u appt. Last appt with Heme Onc 04/08/2022  Estab with Cards and Gertie Fey   HTN - Amlodipine HLD - Rosuvastatin  GERD- Pantoprazole   STOP taking these medications     hydrochlorothiazide 25 MG tablet Commonly known as: HYDRODIURIL    ibuprofen 600 MG tablet Commonly known as: ADVIL    lisinopril 40 MG tablet Commonly known as: ZESTRIL    metoprolol tartrate 50  MG tablet Commonly known as: LOPRESSOR           TAKE these medications     Acetaminophen Extra Strength 500 MG Tabs Take 1 tablet (500 mg total) by mouth every 8 (eight)  hours as needed for mild pain or moderate pain.    acyclovir 400 MG tablet Commonly known as: ZOVIRAX Take 1 tablet (400 mg total) by mouth 2 (two) times daily.    allopurinol 300 MG tablet Commonly known as: ZYLOPRIM Take 1 tablet (300 mg total) by mouth daily.    amLODipine 5 MG tablet Commonly known as: NORVASC Take 1 tablet (5 mg total) by mouth daily. Start taking on: March 18, 2022    aspirin EC 81 MG tablet Take 81 mg by mouth daily. Swallow whole.    docusate sodium 100 MG capsule Commonly known as: COLACE Take 1 capsule (100 mg total) by mouth 2 (two) times daily as needed for mild constipation.    gabapentin 100 MG capsule Commonly known as: NEURONTIN Take 1 capsule (100 mg total) by mouth at bedtime.    lenalidomide 25 MG capsule Commonly known as: REVLIMID Take 1 capsule (25 mg total) by mouth daily. Take for 14 days and then none for 7 days. Repeat every 21 days. [employer] Auth # [id number] Date Obtained 03/05/22    methocarbamol 500 MG tablet Commonly known as: ROBAXIN Take 500 mg by mouth 3 (three) times daily. What changed: Another medication with the same name was added. Make sure you understand how and when to take each.    Methocarbamol 1000 MG Tabs Take 1,000 mg by mouth every 6 (six) hours as needed for muscle spasms. What changed: You were already taking a medication with the same name, and this prescription was added. Make sure you understand how and when to take each.    morphine 60 MG 12 hr tablet Commonly known as: MS CONTIN Take 1 tablet (60 mg total) by mouth every 8 (eight) hours for 3 days. What changed:  medication strength when to take this    ondansetron 8 MG tablet Commonly known as: ZOFRAN Take 1 tablet (8 mg total) by mouth every 8 (eight) hours as needed for nausea or vomiting.    Oxycodone HCl 20 MG Tabs Take 1 tablet (20 mg total) by mouth every 6 (six) hours for 3 days. What changed:  medication strength how much to  take when to take this reasons to take this    pantoprazole 40 MG tablet Commonly known as: PROTONIX Take 1 tablet (40 mg total) by mouth daily.    polyethylene glycol powder 17 GM/SCOOP powder Commonly known as: GLYCOLAX/MIRALAX Take 1 capful (17 g) with water by mouth daily.    prochlorperazine 10 MG tablet Commonly known as: COMPAZINE Take 1 tablet (10 mg total) by mouth every 6 (six) hours as needed for nausea or vomiting.    rosuvastatin 10 MG tablet Commonly known as: CRESTOR Take 10 mg by mouth daily.       Patient Active Problem List   Diagnosis Date Noted   Constipation due to opioid therapy 03/16/2022   Closed fracture of proximal end of left humerus 03/16/2022   Palliative care by specialist 03/15/2022   AKI (acute kidney injury) ([place]) 03/11/2022   Intractable low back pain 03/07/2022   Pathologic fracture 02/17/2022   Traumatic compression fracture of lumbar vertebra ([place]) 02/17/2022   Normocytic anemia 02/17/2022   Hyponatremia 02/17/2022   Fall at home, initial encounter 02/17/2022  Obesity (BMI 30-39.9) 02/17/2022   Multiple myeloma ([place]) 02/16/2022   Multiple myeloma not having achieved remission ([facility]) 02/16/2022   Mixed hyperlipidemia 02/17/2019   Lesion of liver 09/23/2017   GERD (gastroesophageal reflux disease) 09/23/2017   Atypical chest pain 05/01/2016   Abnormal ultrasound 07/19/2015   ALCOHOL ABUSE 09/06/2008   TOBACCO ABUSE 09/06/2008   Essential hypertension 09/06/2008   GERD without esophagitis 09/06/2008     Current Outpatient Medications on File Prior to Visit  Medication Sig Dispense Refill   acetaminophen (TYLENOL) 500 MG tablet Take 1 tablet (500 mg total) by mouth every 8 (eight) hours as needed for mild pain or moderate pain. 30 tablet 0   acyclovir (ZOVIRAX) 400 MG tablet Take 1 tablet (400 mg total) by mouth 2 (two) times daily. 60 tablet 3   allopurinol (ZYLOPRIM) 300 MG tablet Take 1 tablet (300 mg total) by mouth daily. 30  tablet 3   amLODipine (NORVASC) 5 MG tablet Take 1 tablet (5 mg total) by mouth daily.     aspirin EC 81 MG tablet Take 81 mg by mouth daily. Swallow whole.     docusate sodium (COLACE) 100 MG capsule Take 1 capsule (100 mg total) by mouth 2 (two) times daily as needed for mild constipation. 25 capsule 0   gabapentin (NEURONTIN) 100 MG capsule Take 1 capsule (100 mg total) by mouth 2 (two) times daily. 60 capsule 1   lenalidomide (REVLIMID) 25 MG capsule Take 1 capsule (25 mg total) by mouth daily. Take for 14 days and then none for 7 days. Repeat every 21 days. [employer] Auth # [id number] Date Obtained  03/25/22 14 capsule 0   Methocarbamol 1000 MG TABS Take 1,000 mg by mouth every 6 (six) hours as needed. 45 tablet 1   morphine (MS CONTIN) 60 MG 12 hr tablet Take 1 tablet (60 mg total) by mouth every 8 (eight) hours. 90 tablet 0   ondansetron (ZOFRAN) 8 MG tablet Take 1 tablet (8 mg total) by mouth every 8 (eight) hours as needed for nausea or vomiting. 60 tablet 0   Oxycodone HCl 20 MG TABS Take 1 tablet (20 mg total) by mouth every 6 (six) hours as needed. 60 tablet 0   pantoprazole (PROTONIX) 40 MG tablet Take 1 tablet (40 mg total) by mouth 2 (two) times daily. 60 tablet 2   polyethylene glycol powder (GLYCOLAX/MIRALAX) 17 GM/SCOOP powder Take 1 capful (17 g) with water by mouth daily. 238 g 0   prochlorperazine (COMPAZINE) 10 MG tablet Take 1 tablet (10 mg total) by mouth every 6 (six) hours as needed for nausea or vomiting. 60 tablet 0   rosuvastatin (CRESTOR) 10 MG tablet Take 10 mg by mouth daily.     No current facility-administered medications on file prior to visit.    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 [place] Use   Smoking status: Every Day    Packs/day: 0.25    Years: 10.00    Total pack years: 2.50    Types: Cigarettes   Smokeless tobacco: Never  Vaping Use   Vaping Use: Never used  Substance and Sexual  Activity   Alcohol use: Yes    Alcohol/week: 0.0 standard drinks of alcohol    Comment: occasional/wine on the weekends   Drug use: No   Sexual activity: Not on file  Other Topics Concern   Not on file  Social History Narrative   Right handed   Lives alone one story home   Social Determinants of Health   Financial Resource Strain: Not on file  Food Insecurity: No Food Insecurity (03/08/2022)   Hunger Vital Sign    Worried About Running Out of Food in the Last Year: Never true    Ran Out of Food in the Last Year: Never true  Transportation Needs: No Transportation Needs (03/08/2022)   PRAPARE - Hydrologist (Medical): No    Lack of Transportation (Non-Medical): No  Physical Activity: Not on file  Stress: Not on file  Social Connections: Not on file  Intimate Partner Violence: Not At Risk (03/08/2022)   Humiliation, Afraid, Rape, and Kick questionnaire    Fear of Current or Ex-Partner: No    Emotionally Abused: No    Physically Abused: No    Sexually Abused: No    Family History  Problem Relation Age of Onset   Heart disease Mother    Hypertension Mother    Diabetes Father    Hypertension Father    Colon cancer Sister        passed away from colon cancer, in her 11s   Heart attack Maternal Uncle    Prostate cancer Maternal Uncle    Diabetes Other    Pancreatic cancer Neg Hx    Rectal cancer Neg Hx    Esophageal cancer Neg Hx    Stomach cancer Neg Hx     Past Surgical History:  Procedure Laterality Date   COLONOSCOPY  01/10/2009     RMR: Normal rectum, normal colon; repeat in 2015 due to [place] of colon cancer   COLONOSCOPY N/A 01/09/2014   YOV:ZCHYIFOY colonic polyps-removed as described   COLONOSCOPY N/A  09/18/2016   Procedure: COLONOSCOPY;  Surgeon: Daneil Dolin, MD;  [facility];  Service: Endoscopy;  Laterality: N/A;  730    ESOPHAGOGASTRODUODENOSCOPY  10/04/2007   RMR: Distal esophageal erosions consistent with erosive reflux esophagitis, patulous gastroesophageal junction status post passage of a  [place] dilator, 44 [place].  Otherwise, unremarkable esophagus.  Hiatal hernia.  Otherwise normal stomach.  Bulbar erosion   ESOPHAGOGASTRODUODENOSCOPY N/A 01/09/2014   Erosive reflux esophagitis. Small hiatal hernia   HUMERUS IM NAIL Left 02/22/2022   Procedure: INTRAMEDULLARY (IM) NAIL HUMERAL;  Surgeon: Nicholes Stairs, MD;  Location: [facility];  Service: Orthopedics;  Laterality: Left;   I & D EXTREMITY Left 07/26/2018   Procedure: IRRIGATION AND DEBRIDEMENT EXTREMITY;  Surgeon: Dayna Barker, MD;  Location: [facility];  Service: Plastics;  Laterality: Left;   IR BONE TUMOR(S)RF ABLATION  02/05/2022   IR BONE TUMOR(S)RF ABLATION  02/24/2022   IR KYPHO EA ADDL LEVEL THORACIC OR LUMBAR  02/19/2022   IR KYPHO LUMBAR INC FX REDUCE BONE BX UNI/BIL CANNULATION INC/IMAGING  02/05/2022   IR KYPHO LUMBAR INC FX REDUCE BONE BX UNI/BIL CANNULATION INC/IMAGING  02/19/2022   PERCUTANEOUS PINNING Left 07/26/2018   Procedure: PERCUTANEOUS PINNING EXTREMITY;  Surgeon: Dayna Barker, MD;  Location: [facility];  Service: Plastics;  Laterality: Left;    ROS: Review of Systems Negative except as stated above  PHYSICAL EXAM: There were no vitals taken for this visit.  Physical Exam  {male adult master:310786} {male adult master:310785}     Latest Ref Rng & Units 04/08/2022    8:44 AM 04/02/2022    8:14 AM 03/26/2022    1:36 PM  CMP  Glucose 70 - 99 mg/dL 108  101  115   BUN 6 - 20 mg/dL _0 Creatinine 0.61 - 1.24 mg/dL 1.04  0.91  0.88   Sodium 135 - 145 mmol/L 130  133  131   Potassium 3.5 - 5.1 mmol/L 3.3  3.9  4.4   Chloride 98 - 111 mmol/L 96  98  99   CO2 22 - 32 mmol/L _1 Calcium 8.9 - 10.3 mg/dL 9.2  9.3  9.3   Total Protein 6.5 - 8.1 g/dL 8.6  8.5  9.4   Total Bilirubin 0.3 - 1.2 mg/dL 0.7  0.7  0.4   Alkaline Phos 38 - 126 U/L 171  103  101   AST 15 - 41 U/L _2 ALT 0 - 44 U/L 53  27  28    Lipid Panel     Component Value Date/Time   CHOL 183 12/05/2021 1556   TRIG 540 (H) 12/05/2021 1556   HDL 31 (L) 12/05/2021 1556   CHOLHDL 5.9 (H) 12/05/2021 1556   CHOLHDL 8.5 06/23/2008 0810   VLDL 31 06/23/2008 0810   LDLCALC 68 12/05/2021 1556    CBC    Component Value Date/Time   WBC 4.4 04/08/2022 0844   WBC 5.1 03/17/2022 0337   RBC 3.77 (L) 04/08/2022 0844   HGB 10.9 (L) 04/08/2022 0844   HGB 13.5 12/05/2021 1556   HCT 32.7 (L) 04/08/2022 0844   HCT 40.6 12/05/2021 1556   PLT 168 04/08/2022 0844   PLT 238 12/05/2021 1556   MCV 86.7 04/08/2022 0844   MCV 87 12/05/2021 1556   MCH 28.9 04/08/2022 0844   MCHC 33.3 04/08/2022 0844   RDW 13.7 04/08/2022 0844   RDW 13.3 12/05/2021 1556   LYMPHSABS 1.3 04/08/2022 0844   LYMPHSABS 3.7 (H) 12/05/2021 1556   MONOABS 0.5 04/08/2022 0844   EOSABS 0.0 04/08/2022 0844   EOSABS 0.2 12/05/2021 1556   BASOSABS 0.0 04/08/2022 0844   BASOSABS 0.0 12/05/2021 1556    ASSESSMENT AND PLAN:  There are no diagnoses linked to this encounter.   Patient was given the opportunity to ask questions.  Patient verbalized understanding of the plan and was able to repeat key elements of the plan. Patient was given clear instructions to go to Emergency Department or return to medical center if symptoms don't improve, worsen, or new problems develop.The patient verbalized understanding.   No orders of the defined types were placed in this encounter.    Requested Prescriptions    No prescriptions requested or ordered in this encounter    No follow-ups on file.  Camillia Herter, NP

## 2022-04-16 ENCOUNTER — Inpatient Hospital Stay: Payer: BC Managed Care – PPO

## 2022-04-16 ENCOUNTER — Encounter: Payer: Self-pay | Admitting: Physician Assistant

## 2022-04-16 ENCOUNTER — Other Ambulatory Visit: Payer: Self-pay | Admitting: *Deleted

## 2022-04-16 ENCOUNTER — Other Ambulatory Visit: Payer: Self-pay

## 2022-04-16 ENCOUNTER — Inpatient Hospital Stay (HOSPITAL_BASED_OUTPATIENT_CLINIC_OR_DEPARTMENT_OTHER): Payer: BC Managed Care – PPO | Admitting: Nurse Practitioner

## 2022-04-16 ENCOUNTER — Encounter: Payer: Self-pay | Admitting: Nurse Practitioner

## 2022-04-16 VITALS — BP 125/77 | HR 85 | Temp 98.9°F | Resp 16 | Wt 218.8 lb

## 2022-04-16 DIAGNOSIS — Z515 Encounter for palliative care: Secondary | ICD-10-CM

## 2022-04-16 DIAGNOSIS — R110 Nausea: Secondary | ICD-10-CM | POA: Diagnosis not present

## 2022-04-16 DIAGNOSIS — E785 Hyperlipidemia, unspecified: Secondary | ICD-10-CM | POA: Diagnosis not present

## 2022-04-16 DIAGNOSIS — K219 Gastro-esophageal reflux disease without esophagitis: Secondary | ICD-10-CM | POA: Diagnosis not present

## 2022-04-16 DIAGNOSIS — Z7982 Long term (current) use of aspirin: Secondary | ICD-10-CM | POA: Diagnosis not present

## 2022-04-16 DIAGNOSIS — K5903 Drug induced constipation: Secondary | ICD-10-CM

## 2022-04-16 DIAGNOSIS — F17210 Nicotine dependence, cigarettes, uncomplicated: Secondary | ICD-10-CM | POA: Diagnosis not present

## 2022-04-16 DIAGNOSIS — I10 Essential (primary) hypertension: Secondary | ICD-10-CM | POA: Diagnosis not present

## 2022-04-16 DIAGNOSIS — K449 Diaphragmatic hernia without obstruction or gangrene: Secondary | ICD-10-CM | POA: Diagnosis not present

## 2022-04-16 DIAGNOSIS — G893 Neoplasm related pain (acute) (chronic): Secondary | ICD-10-CM | POA: Diagnosis not present

## 2022-04-16 DIAGNOSIS — Z86010 Personal history of colonic polyps: Secondary | ICD-10-CM | POA: Diagnosis not present

## 2022-04-16 DIAGNOSIS — Z8042 Family history of malignant neoplasm of prostate: Secondary | ICD-10-CM | POA: Diagnosis not present

## 2022-04-16 DIAGNOSIS — Z5112 Encounter for antineoplastic immunotherapy: Secondary | ICD-10-CM | POA: Diagnosis not present

## 2022-04-16 DIAGNOSIS — Z79624 Long term (current) use of inhibitors of nucleotide synthesis: Secondary | ICD-10-CM | POA: Diagnosis not present

## 2022-04-16 DIAGNOSIS — M4856XA Collapsed vertebra, not elsewhere classified, lumbar region, initial encounter for fracture: Secondary | ICD-10-CM | POA: Diagnosis not present

## 2022-04-16 DIAGNOSIS — C9000 Multiple myeloma not having achieved remission: Secondary | ICD-10-CM

## 2022-04-16 DIAGNOSIS — K5900 Constipation, unspecified: Secondary | ICD-10-CM | POA: Diagnosis not present

## 2022-04-16 DIAGNOSIS — Z7961 Long term (current) use of immunomodulator: Secondary | ICD-10-CM | POA: Diagnosis not present

## 2022-04-16 DIAGNOSIS — M792 Neuralgia and neuritis, unspecified: Secondary | ICD-10-CM

## 2022-04-16 DIAGNOSIS — R530 Neoplastic (malignant) related fatigue: Secondary | ICD-10-CM | POA: Diagnosis not present

## 2022-04-16 LAB — CMP (CANCER CENTER ONLY)
ALT: 35 U/L (ref 0–44)
AST: 19 U/L (ref 15–41)
Albumin: 3.3 g/dL — ABNORMAL LOW (ref 3.5–5.0)
Alkaline Phosphatase: 118 U/L (ref 38–126)
Anion gap: 7 (ref 5–15)
BUN: 13 mg/dL (ref 6–20)
CO2: 25 mmol/L (ref 22–32)
Calcium: 9.1 mg/dL (ref 8.9–10.3)
Chloride: 104 mmol/L (ref 98–111)
Creatinine: 0.9 mg/dL (ref 0.61–1.24)
GFR, Estimated: >60 mL/min (ref >60)
Glucose, Bld: 107 mg/dL — ABNORMAL HIGH (ref 70–99)
Potassium: 4.1 mmol/L (ref 3.5–5.1)
Sodium: 136 mmol/L (ref 135–145)
Total Bilirubin: 0.2 mg/dL — ABNORMAL LOW (ref 0.3–1.2)
Total Protein: 8.1 g/dL (ref 6.5–8.1)

## 2022-04-16 LAB — CBC WITH DIFFERENTIAL (CANCER CENTER ONLY)
Abs Immature Granulocytes: 0.01 10*3/uL (ref 0.00–0.07)
Basophils Absolute: 0 10*3/uL (ref 0.0–0.1)
Basophils Relative: 0 %
Eosinophils Absolute: 0 10*3/uL (ref 0.0–0.5)
Eosinophils Relative: 0 %
HCT: 30.9 % — ABNORMAL LOW (ref 39.0–52.0)
Hemoglobin: 10.2 g/dL — ABNORMAL LOW (ref 13.0–17.0)
Immature Granulocytes: 0 %
Lymphocytes Relative: 43 %
Lymphs Abs: 1.6 10*3/uL (ref 0.7–4.0)
MCH: 29.7 pg (ref 26.0–34.0)
MCHC: 33 g/dL (ref 30.0–36.0)
MCV: 89.8 fL (ref 80.0–100.0)
Monocytes Absolute: 0.4 10*3/uL (ref 0.1–1.0)
Monocytes Relative: 12 %
Neutro Abs: 1.7 10*3/uL (ref 1.7–7.7)
Neutrophils Relative %: 45 %
Platelet Count: 201 10*3/uL (ref 150–400)
RBC: 3.44 MIL/uL — ABNORMAL LOW (ref 4.22–5.81)
RDW: 13.9 % (ref 11.5–15.5)
WBC Count: 3.8 10*3/uL — ABNORMAL LOW (ref 4.0–10.5)
nRBC: 0 % (ref 0.0–0.2)

## 2022-04-16 MED ORDER — BORTEZOMIB CHEMO SQ INJECTION 3.5 MG (2.5MG/ML)
1.3000 mg/m2 | Freq: Once | INTRAMUSCULAR | Status: AC
  Administered 2022-04-16: 3 mg via SUBCUTANEOUS
  Filled 2022-04-16: qty 1.2

## 2022-04-16 MED ORDER — DENOSUMAB 120 MG/1.7ML ~~LOC~~ SOLN
120.0000 mg | Freq: Once | SUBCUTANEOUS | Status: AC
  Administered 2022-04-16: 120 mg via SUBCUTANEOUS
  Filled 2022-04-16: qty 1.7

## 2022-04-16 MED ORDER — DEXAMETHASONE 4 MG PO TABS
40.0000 mg | ORAL_TABLET | Freq: Once | ORAL | Status: AC
  Administered 2022-04-16: 40 mg via ORAL
  Filled 2022-04-16: qty 10

## 2022-04-16 MED ORDER — LENALIDOMIDE 25 MG PO CAPS: 25.0000 mg | ORAL_CAPSULE | Freq: Every day | ORAL | 0 refills | Status: DC

## 2022-04-16 NOTE — Progress Notes (Signed)
Per Dr. Lorenso Courier ok to proceed with premedications while waiting for CMP results. Dr. Lorenso Courier also stated that we are ok to proceed with xgeva today and patient will begin taking calcium supplementation.

## 2022-04-16 NOTE — Patient Instructions (Signed)
[facility]  Discharge Instructions: Thank you for choosing [facility] to provide your oncology and hematology care.   If you have a lab appointment with the [facility], please go directly to the [facility] and check in at the registration area.   Wear comfortable clothing and clothing appropriate for easy access to any Portacath or PICC line.   We strive to give you quality time with your provider. You may need to reschedule your appointment if you arrive late (15 or more minutes).  Arriving late affects you and other patients whose appointments are after yours.  Also, if you miss three or more appointments without notifying the office, you may be dismissed from the clinic at the provider's discretion.      For prescription refill requests, have your pharmacy contact our office and allow 72 hours for refills to be completed.    Today you received the following chemotherapy and/or immunotherapy agents: Velcade      To help prevent nausea and vomiting after your treatment, we encourage you to take your nausea medication as directed.  BELOW ARE SYMPTOMS THAT SHOULD BE REPORTED IMMEDIATELY: *FEVER GREATER THAN 100.4 F (38 C) OR HIGHER *CHILLS OR SWEATING *NAUSEA AND VOMITING THAT IS NOT CONTROLLED WITH YOUR NAUSEA MEDICATION *UNUSUAL SHORTNESS OF BREATH *UNUSUAL BRUISING OR BLEEDING *URINARY PROBLEMS (pain or burning when urinating, or frequent urination) *BOWEL PROBLEMS (unusual diarrhea, constipation, pain near the anus) TENDERNESS IN MOUTH AND THROAT WITH OR WITHOUT PRESENCE OF ULCERS (sore throat, sores in mouth, or a toothache) UNUSUAL RASH, SWELLING OR PAIN  UNUSUAL VAGINAL DISCHARGE OR ITCHING   Items with * indicate a potential emergency and should be followed up as soon as possible or go to the Emergency Department if any problems should occur.  Please show the CHEMOTHERAPY ALERT CARD or IMMUNOTHERAPY ALERT CARD at check-in to  the Emergency Department and triage nurse.  Should you have questions after your visit or need to cancel or reschedule your appointment, please contact [facility]  Dept: [phone]  and follow the prompts.  Office hours are 8:00 a.m. to 4:30 p.m. Monday - Friday. Please note that voicemails left after 4:00 p.m. may not be returned until the following business day.  We are closed weekends and major holidays. You have access to a nurse at all times for urgent questions. Please call the main number to the clinic Dept: [phone] and follow the prompts.   For any non-urgent questions, you may also contact your provider using MyChart. We now offer e-Visits for anyone 70 and older to request care online for non-urgent symptoms. For details visit [email or web address].   Also download the MyChart app! Go to the app store, search "MyChart", open the app, select [facility], and log in with your MyChart username and password.  Masks are optional in the cancer centers. If you would like for your care team to wear a mask while they are taking care of you, please let them know. You may have one support person who is at least 53 years old accompany you for your appointments.

## 2022-04-16 NOTE — Progress Notes (Signed)
[facility]  [phone] [phone]   Name: [name] Date: 04/16/2022 MRN: [id number]  DOB: April 15, 1971  Patient Care Team: [name], MD as PCP - General (Family Medicine) Lorretta Harp, MD as PCP - Cardiology (Cardiology) Christopher Romney Cristopher Estimable, MD as Attending Physician (Gastroenterology)    INTERVAL HISTORY: [name] is a 51 y.o. male with oncologic medical history including IgG Kappa Multiple Myeloma, L1, L2, and L3 vertebral compression fraction s/p kyphoplasty(9/27) and IM nailing of left humeral shaft (9/30), currently undergoing VRD therapy.  Palliative ask to see for symptom management.   SOCIAL HISTORY:     reports that he has been smoking cigarettes. He has a 2.50 pack-year smoking history. He has never used smokeless tobacco. He reports current alcohol use. He reports that he does not use drugs.  ADVANCE DIRECTIVES:    CODE STATUS:   PAST MEDICAL HISTORY: Past Medical History:  Diagnosis Date   Allergy    Anemia    Atypical chest pain    Cancer (HCC)    Multiple myeloma with normocytic anemia   GERD (gastroesophageal reflux disease)    Hepatic cyst    Benign by MRI   Hiatal hernia    History of colonic polyps    Hyperlipidemia    Hypertension    Rectal bleeding    Tobacco abuse     ALLERGIES:  has No Known Allergies.  MEDICATIONS:  Current Outpatient Medications  Medication Sig Dispense Refill   acetaminophen (TYLENOL) 500 MG tablet Take 1 tablet (500 mg total) by mouth every 8 (eight) hours as needed for mild pain or moderate pain. 30 tablet 0   acyclovir (ZOVIRAX) 400 MG tablet Take 1 tablet (400 mg total) by mouth 2 (two) times daily. 60 tablet 3   allopurinol (ZYLOPRIM) 300 MG tablet Take 1 tablet (300 mg total) by mouth daily. 30 tablet 3   amLODipine (NORVASC) 5 MG tablet Take 1 tablet (5 mg total) by mouth daily.     aspirin EC 81 MG tablet Take 81 mg by mouth daily.  Swallow whole.     docusate sodium (COLACE) 100 MG capsule Take 1 capsule (100 mg total) by mouth 2 (two) times daily as needed for mild constipation. 25 capsule 0   gabapentin (NEURONTIN) 100 MG capsule Take 1 capsule (100 mg total) by mouth 2 (two) times daily. 60 capsule 1   lenalidomide (REVLIMID) 25 MG capsule Take 1 capsule (25 mg total) by mouth daily. Take for 14 days and then none for 7 days. Repeat every 21 days. [employer] Auth # [id number] Date Obtained  03/25/22 14 capsule 0   Methocarbamol 1000 MG TABS Take 1,000 mg by mouth every 6 (six) hours as needed. 45 tablet 1   morphine (MS CONTIN) 60 MG 12 hr tablet Take 1 tablet (60 mg total) by mouth every 8 (eight) hours. 90 tablet 0   ondansetron (ZOFRAN) 8 MG tablet Take 1 tablet (8 mg total) by mouth every 8 (eight) hours as needed for nausea or vomiting. 60 tablet 0   Oxycodone HCl 20 MG TABS Take 1 tablet (20 mg total) by mouth every 6 (six) hours as needed. 60 tablet 0   pantoprazole (PROTONIX) 40 MG tablet Take 1 tablet (40 mg total) by mouth 2 (two) times daily. 60 tablet 2   polyethylene glycol powder (GLYCOLAX/MIRALAX) 17 GM/SCOOP powder Take 1 capful (17 g) with water by mouth daily. 238 g 0  prochlorperazine (COMPAZINE) 10 MG tablet Take 1 tablet (10 mg total) by mouth every 6 (six) hours as needed for nausea or vomiting. 60 tablet 0   rosuvastatin (CRESTOR) 10 MG tablet Take 10 mg by mouth daily.     No current facility-administered medications for this visit.    VITAL SIGNS: There were no vitals taken for this visit. There were no vitals filed for this visit.  Estimated body mass index is 32.3 kg/m as calculated from the following:   Height as of 03/26/22: _0  (1.753 m).   Weight as of an earlier encounter on 04/16/22: 218 lb 12 oz (99.2 kg).   PERFORMANCE STATUS (ECOG) : 1 - Symptomatic but completely ambulatory   Physical Exam General: NAD Cardiovascular: regular rate and rhythm Pulmonary: normal breathing pattern   Abdomen: soft, nontender, + bowel sounds Extremities: no edema, no joint deformities Skin: no rashes Neurological: AAO x3  IMPRESSION: I saw [name] during his infusion. No acute distress noted. Sister is present. He is happy to be back home from recent rehab stay. Is adjusting. Trying to increase his activity as much as he can. Appetite is improving. Shares he is eating more and now has a desire to snack during the day. We discussed food options and safety precautions when eating certain foods.   Neoplasm related pain [name] reports his pain is well-controlled on current regimen.  Does have some occasional aches which she feels is improving as he increases his activity.  Relates this more to a muscular type pain.  We discussed his current pain regimen: MS Contin 60 mg every 8 hours, oxycodone 20 mg every 6 hours as needed for breakthrough pain (does not have to take around-the-clock), gabapentin 100 mg 3 times daily, and Robaxin 3 times daily as needed.  No adjustments needed at this time.  We will continue to closely monitor.  Nausea/Indigestion Nausea and indigestion well-controlled with Protonix, Zofran, and Compazine.  Appetite has improved.  Constipation Constipation has improved with use of daily MiraLAX and senna.  Encouraged him to continue regimen.  PLAN: Protonix 40 mg twice daily MS Contin 60 mg every 8 hours Oxycodone as needed for pain Gabapentin 100 mg 3 times daily Zofran and Compazine as needed for nausea I will plan to see patient back in 3-4 weeks in collaboration with other oncology appointments.  Patient knows to contact office sooner if needed.   Patient expressed understanding and was in agreement with this plan. He also understands that He can call the clinic at any time with any questions, concerns, or complaints.   Any controlled substances utilized were prescribed in the context of palliative care. PDMP has been reviewed.   Time Total: 35 min   Visit  consisted of counseling and education dealing with the complex and emotionally intense issues of symptom management and palliative care in the setting of serious and potentially life-threatening illness.Greater than 50%  of this time was spent counseling and coordinating care related to the above assessment and plan.  Alda Lea, AGPCNP-BC  Palliative Medicine Team/[facility]

## 2022-04-18 ENCOUNTER — Encounter: Payer: Self-pay | Admitting: Physician Assistant

## 2022-04-18 ENCOUNTER — Other Ambulatory Visit (HOSPITAL_COMMUNITY): Payer: Self-pay

## 2022-04-18 DIAGNOSIS — M109 Gout, unspecified: Secondary | ICD-10-CM | POA: Diagnosis not present

## 2022-04-18 DIAGNOSIS — M8458XD Pathological fracture in neoplastic disease, other specified site, subsequent encounter for fracture with routine healing: Secondary | ICD-10-CM | POA: Diagnosis not present

## 2022-04-18 DIAGNOSIS — N179 Acute kidney failure, unspecified: Secondary | ICD-10-CM | POA: Diagnosis not present

## 2022-04-18 DIAGNOSIS — T402X5D Adverse effect of other opioids, subsequent encounter: Secondary | ICD-10-CM | POA: Diagnosis not present

## 2022-04-18 DIAGNOSIS — E782 Mixed hyperlipidemia: Secondary | ICD-10-CM | POA: Diagnosis not present

## 2022-04-18 DIAGNOSIS — M4802 Spinal stenosis, cervical region: Secondary | ICD-10-CM | POA: Diagnosis not present

## 2022-04-18 DIAGNOSIS — K5903 Drug induced constipation: Secondary | ICD-10-CM | POA: Diagnosis not present

## 2022-04-18 DIAGNOSIS — G629 Polyneuropathy, unspecified: Secondary | ICD-10-CM | POA: Diagnosis not present

## 2022-04-18 DIAGNOSIS — G35 Multiple sclerosis: Secondary | ICD-10-CM | POA: Diagnosis not present

## 2022-04-18 DIAGNOSIS — D630 Anemia in neoplastic disease: Secondary | ICD-10-CM | POA: Diagnosis not present

## 2022-04-18 DIAGNOSIS — F17210 Nicotine dependence, cigarettes, uncomplicated: Secondary | ICD-10-CM | POA: Diagnosis not present

## 2022-04-18 DIAGNOSIS — K219 Gastro-esophageal reflux disease without esophagitis: Secondary | ICD-10-CM | POA: Diagnosis not present

## 2022-04-18 DIAGNOSIS — G894 Chronic pain syndrome: Secondary | ICD-10-CM | POA: Diagnosis not present

## 2022-04-18 DIAGNOSIS — S42202D Unspecified fracture of upper end of left humerus, subsequent encounter for fracture with routine healing: Secondary | ICD-10-CM | POA: Diagnosis not present

## 2022-04-18 DIAGNOSIS — C9000 Multiple myeloma not having achieved remission: Secondary | ICD-10-CM | POA: Diagnosis not present

## 2022-04-18 DIAGNOSIS — I10 Essential (primary) hypertension: Secondary | ICD-10-CM | POA: Diagnosis not present

## 2022-04-19 ENCOUNTER — Encounter: Payer: Self-pay | Admitting: Physician Assistant

## 2022-04-20 ENCOUNTER — Encounter: Payer: Self-pay | Admitting: Physician Assistant

## 2022-04-21 ENCOUNTER — Telehealth: Payer: Self-pay | Admitting: Family Medicine

## 2022-04-21 DIAGNOSIS — E782 Mixed hyperlipidemia: Secondary | ICD-10-CM | POA: Diagnosis not present

## 2022-04-21 DIAGNOSIS — M4802 Spinal stenosis, cervical region: Secondary | ICD-10-CM | POA: Diagnosis not present

## 2022-04-21 DIAGNOSIS — D630 Anemia in neoplastic disease: Secondary | ICD-10-CM | POA: Diagnosis not present

## 2022-04-21 DIAGNOSIS — N179 Acute kidney failure, unspecified: Secondary | ICD-10-CM | POA: Diagnosis not present

## 2022-04-21 DIAGNOSIS — S42202D Unspecified fracture of upper end of left humerus, subsequent encounter for fracture with routine healing: Secondary | ICD-10-CM | POA: Diagnosis not present

## 2022-04-21 DIAGNOSIS — F17210 Nicotine dependence, cigarettes, uncomplicated: Secondary | ICD-10-CM | POA: Diagnosis not present

## 2022-04-21 DIAGNOSIS — K5903 Drug induced constipation: Secondary | ICD-10-CM | POA: Diagnosis not present

## 2022-04-21 DIAGNOSIS — M109 Gout, unspecified: Secondary | ICD-10-CM | POA: Diagnosis not present

## 2022-04-21 DIAGNOSIS — T402X5D Adverse effect of other opioids, subsequent encounter: Secondary | ICD-10-CM | POA: Diagnosis not present

## 2022-04-21 DIAGNOSIS — G35 Multiple sclerosis: Secondary | ICD-10-CM | POA: Diagnosis not present

## 2022-04-21 DIAGNOSIS — M8458XD Pathological fracture in neoplastic disease, other specified site, subsequent encounter for fracture with routine healing: Secondary | ICD-10-CM | POA: Diagnosis not present

## 2022-04-21 DIAGNOSIS — G894 Chronic pain syndrome: Secondary | ICD-10-CM | POA: Diagnosis not present

## 2022-04-21 DIAGNOSIS — G629 Polyneuropathy, unspecified: Secondary | ICD-10-CM | POA: Diagnosis not present

## 2022-04-21 DIAGNOSIS — I10 Essential (primary) hypertension: Secondary | ICD-10-CM | POA: Diagnosis not present

## 2022-04-21 DIAGNOSIS — C9000 Multiple myeloma not having achieved remission: Secondary | ICD-10-CM | POA: Diagnosis not present

## 2022-04-21 DIAGNOSIS — K219 Gastro-esophageal reflux disease without esophagitis: Secondary | ICD-10-CM | POA: Diagnosis not present

## 2022-04-21 NOTE — Telephone Encounter (Signed)
Hi! Can you all  help with getting his chemo?

## 2022-04-21 NOTE — Telephone Encounter (Signed)
[facility] called and pt has no further OT needs at this time / please advise

## 2022-04-22 ENCOUNTER — Ambulatory Visit (INDEPENDENT_AMBULATORY_CARE_PROVIDER_SITE_OTHER): Payer: BC Managed Care – PPO | Admitting: Family Medicine

## 2022-04-22 VITALS — BP 114/78 | HR 89 | Temp 98.3°F | Resp 16 | Wt 215.0 lb

## 2022-04-22 DIAGNOSIS — C9000 Multiple myeloma not having achieved remission: Secondary | ICD-10-CM

## 2022-04-22 DIAGNOSIS — Z09 Encounter for follow-up examination after completed treatment for conditions other than malignant neoplasm: Secondary | ICD-10-CM | POA: Diagnosis not present

## 2022-04-22 DIAGNOSIS — M5459 Other low back pain: Secondary | ICD-10-CM

## 2022-04-22 DIAGNOSIS — F32A Depression, unspecified: Secondary | ICD-10-CM

## 2022-04-22 DIAGNOSIS — F419 Anxiety disorder, unspecified: Secondary | ICD-10-CM | POA: Diagnosis not present

## 2022-04-22 DIAGNOSIS — S42202D Unspecified fracture of upper end of left humerus, subsequent encounter for fracture with routine healing: Secondary | ICD-10-CM

## 2022-04-22 DIAGNOSIS — K219 Gastro-esophageal reflux disease without esophagitis: Secondary | ICD-10-CM

## 2022-04-22 DIAGNOSIS — K5903 Drug induced constipation: Secondary | ICD-10-CM

## 2022-04-22 DIAGNOSIS — E782 Mixed hyperlipidemia: Secondary | ICD-10-CM

## 2022-04-22 DIAGNOSIS — M549 Dorsalgia, unspecified: Secondary | ICD-10-CM

## 2022-04-22 DIAGNOSIS — S32000D Wedge compression fracture of unspecified lumbar vertebra, subsequent encounter for fracture with routine healing: Secondary | ICD-10-CM

## 2022-04-22 DIAGNOSIS — D649 Anemia, unspecified: Secondary | ICD-10-CM

## 2022-04-22 DIAGNOSIS — Z515 Encounter for palliative care: Secondary | ICD-10-CM

## 2022-04-22 DIAGNOSIS — N179 Acute kidney failure, unspecified: Secondary | ICD-10-CM

## 2022-04-22 DIAGNOSIS — I10 Essential (primary) hypertension: Secondary | ICD-10-CM

## 2022-04-22 MED ORDER — DULOXETINE HCL 20 MG PO CPEP: 20.0000 mg | ORAL_CAPSULE | Freq: Every day | ORAL | 1 refills | Status: DC

## 2022-04-22 MED ORDER — CLONAZEPAM 0.5 MG PO TABS: 0.5000 mg | ORAL_TABLET | Freq: Two times a day (BID) | ORAL | 0 refills | Status: DC | PRN

## 2022-04-22 NOTE — Progress Notes (Unsigned)
.  Pt presents for hospital follow-up  -hospitalization 03/07/2022 - 03/17/2022

## 2022-04-23 ENCOUNTER — Inpatient Hospital Stay (HOSPITAL_BASED_OUTPATIENT_CLINIC_OR_DEPARTMENT_OTHER): Payer: BC Managed Care – PPO | Admitting: Hematology and Oncology

## 2022-04-23 ENCOUNTER — Inpatient Hospital Stay: Payer: BC Managed Care – PPO

## 2022-04-23 ENCOUNTER — Inpatient Hospital Stay: Payer: BC Managed Care – PPO | Admitting: Dietician

## 2022-04-23 ENCOUNTER — Other Ambulatory Visit: Payer: Self-pay | Admitting: *Deleted

## 2022-04-23 ENCOUNTER — Encounter: Payer: BC Managed Care – PPO | Admitting: Dietician

## 2022-04-23 ENCOUNTER — Other Ambulatory Visit: Payer: Self-pay

## 2022-04-23 VITALS — BP 122/69 | HR 84 | Temp 98.9°F | Resp 16 | Wt 218.9 lb

## 2022-04-23 DIAGNOSIS — C9000 Multiple myeloma not having achieved remission: Secondary | ICD-10-CM | POA: Diagnosis not present

## 2022-04-23 DIAGNOSIS — Z5112 Encounter for antineoplastic immunotherapy: Secondary | ICD-10-CM | POA: Diagnosis not present

## 2022-04-23 DIAGNOSIS — K219 Gastro-esophageal reflux disease without esophagitis: Secondary | ICD-10-CM | POA: Diagnosis not present

## 2022-04-23 DIAGNOSIS — G893 Neoplasm related pain (acute) (chronic): Secondary | ICD-10-CM | POA: Diagnosis not present

## 2022-04-23 DIAGNOSIS — R110 Nausea: Secondary | ICD-10-CM | POA: Diagnosis not present

## 2022-04-23 DIAGNOSIS — Z86010 Personal history of colonic polyps: Secondary | ICD-10-CM | POA: Diagnosis not present

## 2022-04-23 DIAGNOSIS — Z79624 Long term (current) use of inhibitors of nucleotide synthesis: Secondary | ICD-10-CM | POA: Diagnosis not present

## 2022-04-23 DIAGNOSIS — Z8042 Family history of malignant neoplasm of prostate: Secondary | ICD-10-CM | POA: Diagnosis not present

## 2022-04-23 DIAGNOSIS — K5900 Constipation, unspecified: Secondary | ICD-10-CM | POA: Diagnosis not present

## 2022-04-23 DIAGNOSIS — E785 Hyperlipidemia, unspecified: Secondary | ICD-10-CM | POA: Diagnosis not present

## 2022-04-23 DIAGNOSIS — F17210 Nicotine dependence, cigarettes, uncomplicated: Secondary | ICD-10-CM | POA: Diagnosis not present

## 2022-04-23 DIAGNOSIS — I10 Essential (primary) hypertension: Secondary | ICD-10-CM | POA: Diagnosis not present

## 2022-04-23 DIAGNOSIS — K449 Diaphragmatic hernia without obstruction or gangrene: Secondary | ICD-10-CM | POA: Diagnosis not present

## 2022-04-23 DIAGNOSIS — M4856XA Collapsed vertebra, not elsewhere classified, lumbar region, initial encounter for fracture: Secondary | ICD-10-CM | POA: Diagnosis not present

## 2022-04-23 DIAGNOSIS — Z7982 Long term (current) use of aspirin: Secondary | ICD-10-CM | POA: Diagnosis not present

## 2022-04-23 DIAGNOSIS — Z7961 Long term (current) use of immunomodulator: Secondary | ICD-10-CM | POA: Diagnosis not present

## 2022-04-23 LAB — CBC WITH DIFFERENTIAL (CANCER CENTER ONLY)
Abs Immature Granulocytes: 0.01 10*3/uL (ref 0.00–0.07)
Basophils Absolute: 0 10*3/uL (ref 0.0–0.1)
Basophils Relative: 0 %
Eosinophils Absolute: 0 10*3/uL (ref 0.0–0.5)
Eosinophils Relative: 0 %
HCT: 31.1 % — ABNORMAL LOW (ref 39.0–52.0)
Hemoglobin: 10.1 g/dL — ABNORMAL LOW (ref 13.0–17.0)
Immature Granulocytes: 0 %
Lymphocytes Relative: 44 %
Lymphs Abs: 1.9 10*3/uL (ref 0.7–4.0)
MCH: 29.5 pg (ref 26.0–34.0)
MCHC: 32.5 g/dL (ref 30.0–36.0)
MCV: 90.9 fL (ref 80.0–100.0)
Monocytes Absolute: 0.3 10*3/uL (ref 0.1–1.0)
Monocytes Relative: 7 %
Neutro Abs: 2.1 10*3/uL (ref 1.7–7.7)
Neutrophils Relative %: 49 %
Platelet Count: 238 10*3/uL (ref 150–400)
RBC: 3.42 MIL/uL — ABNORMAL LOW (ref 4.22–5.81)
RDW: 14.5 % (ref 11.5–15.5)
WBC Count: 4.3 10*3/uL (ref 4.0–10.5)
nRBC: 0 % (ref 0.0–0.2)

## 2022-04-23 LAB — CMP (CANCER CENTER ONLY)
ALT: 22 U/L (ref 0–44)
AST: 14 U/L — ABNORMAL LOW (ref 15–41)
Albumin: 3.8 g/dL (ref 3.5–5.0)
Alkaline Phosphatase: 144 U/L — ABNORMAL HIGH (ref 38–126)
Anion gap: 4 — ABNORMAL LOW (ref 5–15)
BUN: 9 mg/dL (ref 6–20)
CO2: 26 mmol/L (ref 22–32)
Calcium: 7.8 mg/dL — ABNORMAL LOW (ref 8.9–10.3)
Chloride: 105 mmol/L (ref 98–111)
Creatinine: 0.9 mg/dL (ref 0.61–1.24)
GFR, Estimated: >60 mL/min (ref >60)
Glucose, Bld: 109 mg/dL — ABNORMAL HIGH (ref 70–99)
Potassium: 4.3 mmol/L (ref 3.5–5.1)
Sodium: 135 mmol/L (ref 135–145)
Total Bilirubin: 0.3 mg/dL (ref 0.3–1.2)
Total Protein: 8.2 g/dL — ABNORMAL HIGH (ref 6.5–8.1)

## 2022-04-23 MED ORDER — BORTEZOMIB CHEMO SQ INJECTION 3.5 MG (2.5MG/ML)
1.3000 mg/m2 | Freq: Once | INTRAMUSCULAR | Status: AC
  Administered 2022-04-23: 3 mg via SUBCUTANEOUS
  Filled 2022-04-23: qty 1.2

## 2022-04-23 MED ORDER — LENALIDOMIDE 25 MG PO CAPS: 25.0000 mg | ORAL_CAPSULE | Freq: Every day | ORAL | 0 refills | Status: DC

## 2022-04-23 MED ORDER — DEXAMETHASONE 4 MG PO TABS
40.0000 mg | ORAL_TABLET | Freq: Once | ORAL | Status: AC
  Administered 2022-04-23: 40 mg via ORAL
  Filled 2022-04-23: qty 10

## 2022-04-23 NOTE — Patient Instructions (Signed)
[facility]  Discharge Instructions: Thank you for choosing [facility] to provide your oncology and hematology care.   If you have a lab appointment with the [facility], please go directly to the [facility] and check in at the registration area.   Wear comfortable clothing and clothing appropriate for easy access to any Portacath or PICC line.   We strive to give you quality time with your provider. You may need to reschedule your appointment if you arrive late (15 or more minutes).  Arriving late affects you and other patients whose appointments are after yours.  Also, if you miss three or more appointments without notifying the office, you may be dismissed from the clinic at the provider's discretion.      For prescription refill requests, have your pharmacy contact our office and allow 72 hours for refills to be completed.    Today you received the following chemotherapy and/or immunotherapy agents: Velcade      To help prevent nausea and vomiting after your treatment, we encourage you to take your nausea medication as directed.  BELOW ARE SYMPTOMS THAT SHOULD BE REPORTED IMMEDIATELY: *FEVER GREATER THAN 100.4 F (38 C) OR HIGHER *CHILLS OR SWEATING *NAUSEA AND VOMITING THAT IS NOT CONTROLLED WITH YOUR NAUSEA MEDICATION *UNUSUAL SHORTNESS OF BREATH *UNUSUAL BRUISING OR BLEEDING *URINARY PROBLEMS (pain or burning when urinating, or frequent urination) *BOWEL PROBLEMS (unusual diarrhea, constipation, pain near the anus) TENDERNESS IN MOUTH AND THROAT WITH OR WITHOUT PRESENCE OF ULCERS (sore throat, sores in mouth, or a toothache) UNUSUAL RASH, SWELLING OR PAIN  UNUSUAL VAGINAL DISCHARGE OR ITCHING   Items with * indicate a potential emergency and should be followed up as soon as possible or go to the Emergency Department if any problems should occur.  Please show the CHEMOTHERAPY ALERT CARD or IMMUNOTHERAPY ALERT CARD at check-in to  the Emergency Department and triage nurse.  Should you have questions after your visit or need to cancel or reschedule your appointment, please contact [facility]  Dept: [phone]  and follow the prompts.  Office hours are 8:00 a.m. to 4:30 p.m. Monday - Friday. Please note that voicemails left after 4:00 p.m. may not be returned until the following business day.  We are closed weekends and major holidays. You have access to a nurse at all times for urgent questions. Please call the main number to the clinic Dept: [phone] and follow the prompts.   For any non-urgent questions, you may also contact your provider using MyChart. We now offer e-Visits for anyone 71 and older to request care online for non-urgent symptoms. For details visit [email or web address].   Also download the MyChart app! Go to the app store, search "MyChart", open the app, select [facility], and log in with your MyChart username and password.  Masks are optional in the cancer centers. If you would like for your care team to wear a mask while they are taking care of you, please let them know. You may have one support person who is at least 53 years old accompany you for your appointments.

## 2022-04-23 NOTE — Progress Notes (Signed)
[place] Telephone:([phone]) [phone]   [phone]  PROGRESS NOTE  Patient Care Team: [name], MD as PCP - General (Family Medicine) Lorretta Harp, MD as PCP - Cardiology (Cardiology) Gala Romney Cristopher Estimable, MD as Attending Physician (Gastroenterology)  Hematological/Oncological History # IgG Kappa Multiple Myeloma, 1p32/1q21.  01/04/2022: MRI lumbar spine shows diffuse heterogeneous marrow signal with heterogeneous enhancement throughout the thoracolumbar spine.  Additionally, there is subacute-chronic L3 vertebral body compression fraction. 01/14/2022: establish care with Dr. Lorenso Courier  02/10/2022: bone marrow biopsy shows range of plasma cell involvement from 50% on the biopsy to 60% on aspirate smear slides and close to 90% on the clot section for an overall percentage estimated at approximately 80% overall. 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  02/26/2022: Cycle 1 of VRD therapy 04/01/2022: Cycle 2 of VRD therapy  04/23/2022: Cycle 3 of VRD therapy   Interval History:  [name] 51 y.o. male with medical history significant for newly diagnosed IgG kappa multiple myeloma who presents for a follow up visit. The patient's last visit was on 03/26/2022 at which time he started VRD therapy. In the interim, he was admitted for intractable low back pain. He presents today to start Cycle 3, Day 1 of VRD therapy.   On exam today Mr. Code reports his pain is currently well-managed.  He reports it is currently a 5 out of 10.  He notes that it typically occurs at the back and rotates around to the stomach.  He is taking his oxycodone 20 mg approximately every 6 to 7 hours.  He notes overall everything is steady.  He is taking a stool softeners and his bowel movements are regular.  He reports that unfortunately he has not yet obtained his Revlimid as there has been a mixup with the pharmacy.  Nurse Eustaquio Maize is working on this.  He reports  that his calcium levels are low but he is not having any symptoms of this.  He reports he is having some fatigue and sleepiness but no numbness or tingling of his fingers and toes.  He is eating well with a strong appetite.  He reports that he looks forward to meeting with nutritionist today in order to help him eat more healthy.  He is walking with a cane and has been doing his best to try to keep up mobility and exercise.  He denies fevers, chills, sweats, shortness of breath, chest pain or cough. He has no other complaints. A full 10 point ROS was otherwise negative.  MEDICAL HISTORY:  Past Medical History:  Diagnosis Date   Allergy    Anemia    Atypical chest pain    Cancer (HCC)    Multiple myeloma with normocytic anemia   GERD (gastroesophageal reflux disease)    Hepatic cyst    Benign by MRI   Hiatal hernia    History of colonic polyps    Hyperlipidemia    Hypertension    Rectal bleeding    Tobacco abuse     SURGICAL HISTORY: Past Surgical History:  Procedure Laterality Date   COLONOSCOPY  01/10/2009     RMR: Normal rectum, normal colon; repeat in 2015 due to [place] of colon cancer   COLONOSCOPY N/A 01/09/2014   KKX:FGHWEXHB colonic polyps-removed as described   COLONOSCOPY N/A 09/18/2016   Procedure: COLONOSCOPY;  Surgeon: Daneil Dolin, MD;  [facility];  Service: Endoscopy;  Laterality: N/A;  730    ESOPHAGOGASTRODUODENOSCOPY  10/04/2007  RMR: Distal esophageal erosions consistent with erosive reflux esophagitis, patulous gastroesophageal junction status post passage of a  Maloney dilator, 47 [place].  Otherwise, unremarkable esophagus.  Hiatal hernia.  Otherwise normal stomach.  Bulbar erosion   ESOPHAGOGASTRODUODENOSCOPY N/A 01/09/2014   Erosive reflux esophagitis. Small hiatal hernia   HUMERUS IM NAIL Left 02/22/2022   Procedure: INTRAMEDULLARY (IM) NAIL HUMERAL;  Surgeon: Nicholes Stairs, MD;  Location: [facility];  Service: Orthopedics;  Laterality: Left;   I  & D EXTREMITY Left 07/26/2018   Procedure: IRRIGATION AND DEBRIDEMENT EXTREMITY;  Surgeon: Dayna Barker, MD;  Location: [place];  Service: Plastics;  Laterality: Left;   IR BONE TUMOR(S)RF ABLATION  02/05/2022   IR BONE TUMOR(S)RF ABLATION  02/24/2022   IR KYPHO EA ADDL LEVEL THORACIC OR LUMBAR  02/19/2022   IR KYPHO LUMBAR INC FX REDUCE BONE BX UNI/BIL CANNULATION INC/IMAGING  02/05/2022   IR KYPHO LUMBAR INC FX REDUCE BONE BX UNI/BIL CANNULATION INC/IMAGING  02/19/2022   PERCUTANEOUS PINNING Left 07/26/2018   Procedure: PERCUTANEOUS PINNING EXTREMITY;  Surgeon: Dayna Barker, MD;  Location: [place];  Service: Plastics;  Laterality: Left;    SOCIAL HISTORY: Social History   Socioeconomic History   Marital status: Single    Spouse name: Not on file   Number of children: 2   Years of education: Not on file   Highest education level: Not on file  Occupational History   Occupation: Scientist, 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   Smoking status: Every Day    Packs/day: 0.25    Years: 10.00    Total pack years: 2.50    Types: Cigarettes   Smokeless tobacco: Never  Vaping Use   Vaping Use: Never used  Substance and Sexual Activity   Alcohol use: Yes    Alcohol/week: 0.0 standard drinks of alcohol    Comment: occasional/wine on the weekends   Drug use: No   Sexual activity: Not on file  Other Topics Concern   Not on file  Social History Narrative   Right handed   Lives alone one story home   Social Determinants of Health   Financial Resource Strain: Not on file  Food Insecurity: No Food Insecurity (03/08/2022)   Hunger Vital Sign    Worried About Running Out of Food in the Last Year: Never true    Ran Out of Food in the Last Year: Never true  Transportation Needs: No Transportation Needs (03/08/2022)   PRAPARE - Hydrologist (Medical): No    Lack of Transportation (Non-Medical): No  Physical  Activity: Not on file  Stress: Not on file  Social Connections: Not on file  Intimate Partner Violence: Not At Risk (03/08/2022)   Humiliation, Afraid, Rape, and Kick questionnaire    Fear of Current or Ex-Partner: No    Emotionally Abused: No    Physically Abused: No    Sexually Abused: No    FAMILY HISTORY: Family History  Problem Relation Age of Onset   Heart disease Mother    Hypertension Mother    Diabetes Father    Hypertension Father    Colon cancer Sister        passed away from colon cancer, in her 66s   Heart attack Maternal Uncle    Prostate cancer Maternal Uncle    Diabetes Other    Pancreatic cancer Neg Hx    Rectal cancer Neg Hx  Esophageal cancer Neg Hx    Stomach cancer Neg Hx     ALLERGIES:  has No Known Allergies.  MEDICATIONS:  Current Outpatient Medications  Medication Sig Dispense Refill   acetaminophen (TYLENOL) 500 MG tablet Take 1 tablet (500 mg total) by mouth every 8 (eight) hours as needed for mild pain or moderate pain. 30 tablet 0   acyclovir (ZOVIRAX) 400 MG tablet Take 1 tablet (400 mg total) by mouth 2 (two) times daily. 60 tablet 3   allopurinol (ZYLOPRIM) 300 MG tablet Take 1 tablet (300 mg total) by mouth daily. 30 tablet 3   amLODipine (NORVASC) 5 MG tablet Take 1 tablet (5 mg total) by mouth daily.     aspirin EC 81 MG tablet Take 81 mg by mouth daily. Swallow whole.     clonazePAM (KLONOPIN) 0.5 MG tablet Take 1 tablet (0.5 mg total) by mouth 2 (two) times daily as needed for anxiety. 15 tablet 0   docusate sodium (COLACE) 100 MG capsule Take 1 capsule (100 mg total) by mouth 2 (two) times daily as needed for mild constipation. 25 capsule 0   DULoxetine (CYMBALTA) 20 MG capsule Take 1 capsule (20 mg total) by mouth daily. 30 capsule 1   gabapentin (NEURONTIN) 100 MG capsule Take 1 capsule (100 mg total) by mouth 2 (two) times daily. 60 capsule 1   lenalidomide (REVLIMID) 25 MG capsule Take 1 capsule (25 mg total) by mouth daily. Take  for 14 days and then none for 7 days. Repeat every 21 days. [employer] Auth # [id number] Date Obtained  03/25/22 14 capsule 0   Methocarbamol 1000 MG TABS Take 1,000 mg by mouth every 6 (six) hours as needed. 45 tablet 1   morphine (MS CONTIN) 60 MG 12 hr tablet Take 1 tablet (60 mg total) by mouth every 8 (eight) hours. 90 tablet 0   ondansetron (ZOFRAN) 8 MG tablet Take 1 tablet (8 mg total) by mouth every 8 (eight) hours as needed for nausea or vomiting. 60 tablet 0   Oxycodone HCl 20 MG TABS Take 1 tablet (20 mg total) by mouth every 6 (six) hours as needed. 60 tablet 0   pantoprazole (PROTONIX) 40 MG tablet Take 1 tablet (40 mg total) by mouth 2 (two) times daily. 60 tablet 2   polyethylene glycol powder (GLYCOLAX/MIRALAX) 17 GM/SCOOP powder Take 1 capful (17 g) with water by mouth daily. 238 g 0   prochlorperazine (COMPAZINE) 10 MG tablet Take 1 tablet (10 mg total) by mouth every 6 (six) hours as needed for nausea or vomiting. 60 tablet 0   rosuvastatin (CRESTOR) 10 MG tablet Take 10 mg by mouth daily.     No current facility-administered medications for this visit.    REVIEW OF SYSTEMS:   Constitutional: ( - ) fevers, ( - )  chills , ( - ) night sweats Eyes: ( - ) blurriness of vision, ( - ) double vision, ( - ) watery eyes Ears, nose, mouth, throat, and face: ( - ) mucositis, ( - ) sore throat Respiratory: ( - ) cough, ( - ) dyspnea, ( - ) wheezes Cardiovascular: ( - ) palpitation, ( - ) chest discomfort, ( - ) lower extremity swelling Gastrointestinal:  ( - ) nausea, ( - ) heartburn, ( - ) change in bowel habits Skin: ( - ) abnormal skin rashes Lymphatics: ( - ) new lymphadenopathy, ( - ) easy bruising Neurological: ( - ) numbness, ( - ) tingling, ( - )  new weaknesses Behavioral/Psych: ( - ) mood change, ( - ) new changes  All other systems were reviewed with the patient and are negative.  PHYSICAL EXAMINATION: ECOG PERFORMANCE STATUS: 1 - Symptomatic but completely  ambulatory  Vitals:   04/23/22 1013  BP: 122/69  Pulse: 84  Resp: 16  Temp: 98.9 F (37.2 C)  SpO2: 100%   Filed Weights   04/23/22 1013  Weight: 218 lb 14.4 oz (99.3 kg)    GENERAL: Well-appearing young African-American male, alert, no distress and comfortable SKIN: skin color, texture, turgor are normal, no rashes or significant lesions EYES: conjunctiva are pink and non-injected, sclera clear LUNGS: clear to auscultation and percussion with normal breathing effort HEART: regular rate & rhythm and no murmurs and no lower extremity edema Musculoskeletal: no cyanosis of digits and no clubbing  PSYCH: alert & oriented x 3, fluent speech NEURO: no focal motor/sensory deficits  LABORATORY DATA:  I have reviewed the data as listed    Latest Ref Rng & Units 04/23/2022    8:44 AM 04/16/2022    8:56 AM 04/08/2022    8:44 AM  CBC  WBC 4.0 - 10.5 K/uL 4.3  3.8  4.4   Hemoglobin 13.0 - 17.0 g/dL 10.1  10.2  10.9   Hematocrit 39.0 - 52.0 % 31.1  30.9  32.7   Platelets 150 - 400 K/uL 238  201  168        Latest Ref Rng & Units 04/23/2022    8:44 AM 04/16/2022    8:56 AM 04/08/2022    8:44 AM  CMP  Glucose 70 - 99 mg/dL 109  107  108   BUN 6 - 20 mg/dL _0 Creatinine 0.61 - 1.24 mg/dL 0.90  0.90  1.04   Sodium 135 - 145 mmol/L 135  136  130   Potassium 3.5 - 5.1 mmol/L 4.3  4.1  3.3   Chloride 98 - 111 mmol/L 105  104  96   CO2 22 - 32 mmol/L _1 Calcium 8.9 - 10.3 mg/dL 7.8  9.1  9.2   Total Protein 6.5 - 8.1 g/dL 8.2  8.1  8.6   Total Bilirubin 0.3 - 1.2 mg/dL 0.3  0.2  0.7   Alkaline Phos 38 - 126 U/L 144  118  171   AST 15 - 41 U/L _2 ALT 0 - 44 U/L 22  35  53     Lab Results  Component Value Date   MPROTEIN 1.9 (H) 04/02/2022   MPROTEIN 3.1 (H) 01/14/2022   Lab Results  Component Value Date   KPAFRELGTCHN 90.1 (H) 04/02/2022   KPAFRELGTCHN 92.0 (H) 01/14/2022   LAMBDASER 11.7 04/02/2022   LAMBDASER 9.4 01/14/2022   KAPLAMBRATIO  7.70 (H) 04/02/2022   KAPLAMBRATIO 57.71 (H) 01/17/2022   KAPLAMBRATIO 9.79 (H) 01/14/2022   RADIOGRAPHIC STUDIES: No results found.  ASSESSMENT & PLAN [name] 51 y.o. male with medical history significant for newly diagnosed IgG kappa multiple myeloma who presents for a follow up visit.  After review of the labs, review the records, discussion with the patient the findings are most consistent with newly diagnosed multiple myeloma.  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  At this time would recommend proceeding with VRD chemotherapy with the intention of referral for ASCT when his labs are appropriate.  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 -- Recommend VRD chemotherapy with intention of proceeding to transplant. -- Labs at each visit to include CBC, CMP, LDH with monthly restaging labs SPEP and serum free light chains -- Started VRD therapy on 02/26/2022.  PLAN: --Presents today to start Cycle 3, Day 1 of VRD therapy --Labs from today were reviewed and adequate for treatment. WBC 4.3, hemoglobin 10.1, MCV 90.9, and platelets of 238.   --Proceed with treatment today as planned without any dose modifications --Continue with weekly treatments and follow up in clinic in 2 weeks.   #Pathologic fractures-secondary to MM: --Involving L1, L2 and L3 compression fracture and left humerus fracture.  --Underwent kyphoplasty of L3 fracture on 02/19/2022 --Underwent medullary nailing of left humeral shaft on 02/22/2022  #Pain Medication -- Per palliative care continue on MS contin 60 mg q 8 hours, oxycodone 20 mg q 6 hours, change robaxin 1000 mg QID.  --Appreciate assistance of palliative care NP for further management of pain and other chronic symptoms.   #Supportive Care -- chemotherapy  education complete -- port placement not required -- zofran 80m q8H PRN and compazine 152mPO q6H for nausea -- acyclovir 40012mO BID for VCZ prophylaxis -- allopurinol 300m84m daily for TLS prophylaxis -- Dental clearance for Zometa/Denosumab approved, requested at least 6 weeks from dental work on 02/25/2022. OK to proceed with Denosumab at next visit.   No orders of the defined types were placed in this encounter.   All questions were answered. The patient knows to call the clinic with any problems, questions or concerns.  I have spent a total of 30 minutes minutes of face-to-face and non-face-to-face time, preparing to see the patiRooseveltedically appropriate examination, counseling and educating the patient, ordering medications/tests, documenting clinical information in the electronic health record,  and care coordination.   JohnLedell Peoples Department of Hematology/Oncology [facility]: [phone]: [phone]: [email or web address]   04/23/2022 6:44 PM

## 2022-04-23 NOTE — Progress Notes (Signed)
Nutrition Assessment   Reason for Assessment: Referral (wt loss)   ASSESSMENT: 51 year old male multiple myeloma. S/p kyphoplasty of L3 fracture on 9/27, IMN of left humeral shaft on 9/30. He is currently receiving VRD therapy q21d (started 10/4). Patient is under the care of Dr. Dorsey  Past medical history includes anemia, GERD, hiatal hernia, HLD, HTN, tobacco abuse  Met with patient in infusion. He is putting on his jacket and is finished with treatment. Patient reports the schedule on mychart differed from schedule for today. He arrived early for appointments. Sister is present at visit today. They are planning to go grab a bite to celebrate patient's birthday. Patient reports his appetite is good. He is eating well since returning home from the hospital. Patient has questions regarding food safety. Patient denies nausea, vomiting, diarrhea, constipation. He is working to increase activity at home and is undergoing PT. Patient would like to get out of the house more, but has to rely on others for transportation at this time. Patient is looking forward to being cleared to drive.    Nutrition Focused Physical Exam: deferred (pt completed treatment early - ready to go)   Medications: acyclovir, zyloprim, amlodipine, klonopin, colace, cymbalta, gabapentin, morphine, zofran, oxycodone, protonix, miralax, compazine, crestor   Labs: glucose 109, calcium 7.8   Anthropometrics:   Height: 5'9" Weight: 218 lb 14.4 oz  UBW: 245 lb (03/03/22) BMI: 32.33   NUTRITION DIAGNOSIS: Unintentional weight loss related to newly diagnosed multiple myeloma as evidenced by pathologic fractures s/p surgical repair, 11% (27 lb) wt decrease in 8 weeks - severe for time frame   INTERVENTION:  Reviewed food safety and importance for adhering to recommendations Educated on importance of adequate calorie and protein energy intake to maintain strength/stable weight during treatment Educated on protein foods to  support healing - encouraged protein source with all meals/snacks - handout with ideas provided Discussed strategies for altered taste - handout with tips provided  Educated on support services available at [facility] including on-line yoga and tai chi classes - will email flyer    MONITORING, EVALUATION, GOAL: Patient will tolerate adequate calories and protein to minimize further wt loss during treatment   Next Visit: Wednesday December 27 during infusion        

## 2022-04-24 ENCOUNTER — Encounter: Payer: Self-pay | Admitting: Family Medicine

## 2022-04-24 LAB — KAPPA/LAMBDA LIGHT CHAINS
Kappa free light chain: 44.2 mg/L — ABNORMAL HIGH (ref 3.3–19.4)
Kappa, lambda light chain ratio: 3.98 — ABNORMAL HIGH (ref 0.26–1.65)
Lambda free light chains: 11.1 mg/L (ref 5.7–26.3)

## 2022-04-24 NOTE — Progress Notes (Signed)
 Established Patient Office Visit  Subjective    Patient ID: [name], male    DOB: 06/22/1970  Age: 51 y.o. MRN: [id number]  CC:  Chief Complaint  Patient presents with   Hospitalization Follow-up    03/07/2022 - 03/17/2022    HPI [name] presents for routine follow up of chronic med issues after recent discharge from hospital.    Outpatient Encounter Medications as of 04/22/2022  Medication Sig   clonazePAM (KLONOPIN) 0.5 MG tablet Take 1 tablet (0.5 mg total) by mouth 2 (two) times daily as needed for anxiety.   DULoxetine (CYMBALTA) 20 MG capsule Take 1 capsule (20 mg total) by mouth daily.   acetaminophen (TYLENOL) 500 MG tablet Take 1 tablet (500 mg total) by mouth every 8 (eight) hours as needed for mild pain or moderate pain.   acyclovir (ZOVIRAX) 400 MG tablet Take 1 tablet (400 mg total) by mouth 2 (two) times daily.   allopurinol (ZYLOPRIM) 300 MG tablet Take 1 tablet (300 mg total) by mouth daily.   amLODipine (NORVASC) 5 MG tablet Take 1 tablet (5 mg total) by mouth daily.   aspirin EC 81 MG tablet Take 81 mg by mouth daily. Swallow whole.   docusate sodium (COLACE) 100 MG capsule Take 1 capsule (100 mg total) by mouth 2 (two) times daily as needed for mild constipation.   gabapentin (NEURONTIN) 100 MG capsule Take 1 capsule (100 mg total) by mouth 2 (two) times daily.   Methocarbamol 1000 MG TABS Take 1,000 mg by mouth every 6 (six) hours as needed.   morphine (MS CONTIN) 60 MG 12 hr tablet Take 1 tablet (60 mg total) by mouth every 8 (eight) hours.   ondansetron (ZOFRAN) 8 MG tablet Take 1 tablet (8 mg total) by mouth every 8 (eight) hours as needed for nausea or vomiting.   Oxycodone HCl 20 MG TABS Take 1 tablet (20 mg total) by mouth every 6 (six) hours as needed.   pantoprazole (PROTONIX) 40 MG tablet Take 1 tablet (40 mg total) by mouth 2 (two) times daily.   polyethylene glycol powder (GLYCOLAX/MIRALAX) 17 GM/SCOOP powder Take 1 capful (17 g) with  water by mouth daily.   prochlorperazine (COMPAZINE) 10 MG tablet Take 1 tablet (10 mg total) by mouth every 6 (six) hours as needed for nausea or vomiting.   rosuvastatin (CRESTOR) 10 MG tablet Take 10 mg by mouth daily.   [DISCONTINUED] lenalidomide (REVLIMID) 25 MG capsule Take 1 capsule (25 mg total) by mouth daily. Take for 14 days and then none for 7 days. Repeat every 21 days. [employer] Auth # [id number] Date Obtained  03/25/22   No facility-administered encounter medications on file as of 04/22/2022.    Past Medical History:  Diagnosis Date   Allergy    Anemia    Atypical chest pain    Cancer (HCC)    Multiple myeloma with normocytic anemia   GERD (gastroesophageal reflux disease)    Hepatic cyst    Benign by MRI   Hiatal hernia    History of colonic polyps    Hyperlipidemia    Hypertension    Rectal bleeding    Tobacco abuse     Past Surgical History:  Procedure Laterality Date   COLONOSCOPY  01/10/2009     RMR: Normal rectum, normal colon; repeat in 2015 due to FH of colon cancer   COLONOSCOPY N/A 01/09/2014   RMR:Multiple colonic polyps-removed as described   COLONOSCOPY N/A 09/18/2016     Procedure: COLONOSCOPY;  Surgeon: Daneil Dolin, MD;  [facility];  Service: Endoscopy;  Laterality: N/A;  730    ESOPHAGOGASTRODUODENOSCOPY  10/04/2007   RMR: Distal esophageal erosions consistent with erosive reflux esophagitis, patulous gastroesophageal junction status post passage of a  Maloney dilator, 50 [place].  Otherwise, unremarkable esophagus.  Hiatal hernia.  Otherwise normal stomach.  Bulbar erosion   ESOPHAGOGASTRODUODENOSCOPY N/A 01/09/2014   Erosive reflux esophagitis. Small hiatal hernia   HUMERUS IM NAIL Left 02/22/2022   Procedure: INTRAMEDULLARY (IM) NAIL HUMERAL;  Surgeon: Nicholes Stairs, MD;  Location: [facility];  Service: Orthopedics;  Laterality: Left;   I & D EXTREMITY Left 07/26/2018   Procedure: IRRIGATION AND DEBRIDEMENT EXTREMITY;  Surgeon: Dayna Barker, MD;  Location: [facility];  Service: Plastics;  Laterality: Left;   IR BONE TUMOR(S)RF ABLATION  02/05/2022   IR BONE TUMOR(S)RF ABLATION  02/24/2022   IR KYPHO EA ADDL LEVEL THORACIC OR LUMBAR  02/19/2022   IR KYPHO LUMBAR INC FX REDUCE BONE BX UNI/BIL CANNULATION INC/IMAGING  02/05/2022   IR KYPHO LUMBAR INC FX REDUCE BONE BX UNI/BIL CANNULATION INC/IMAGING  02/19/2022   PERCUTANEOUS PINNING Left 07/26/2018   Procedure: PERCUTANEOUS PINNING EXTREMITY;  Surgeon: Dayna Barker, MD;  Location: [facility];  Service: Plastics;  Laterality: Left;    Family History  Problem Relation Age of Onset   Heart disease Mother    Hypertension Mother    Diabetes Father    Hypertension Father    Colon cancer Sister        passed away from colon cancer, in her 81s   Heart attack Maternal Uncle    Prostate cancer Maternal Uncle    Diabetes Other    Pancreatic cancer Neg Hx    Rectal cancer Neg Hx    Esophageal cancer Neg Hx    Stomach cancer Neg Hx     Social History   Socioeconomic History   Marital status: Single    Spouse name: Not on file   Number of children: 2   Years of education: Not on file   Highest education level: Not on file  Occupational History   Occupation: Scientist, 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   Smoking status: Every Day    Packs/day: 0.25    Years: 10.00    Total pack years: 2.50    Types: Cigarettes   Smokeless tobacco: Never  Vaping Use   Vaping Use: Never used  Substance and Sexual Activity   Alcohol use: Yes    Alcohol/week: 0.0 standard drinks of alcohol    Comment: occasional/wine on the weekends   Drug use: No   Sexual activity: Not on file  Other Topics Concern   Not on file  Social History Narrative   Right handed   Lives alone one story home   Social Determinants of Health   Financial Resource Strain: Not on file  Food Insecurity: No Food Insecurity (03/08/2022)   Hunger Vital Sign     Worried About Running Out of Food in the Last Year: Never true    Ran Out of Food in the Last Year: Never true  Transportation Needs: No Transportation Needs (03/08/2022)   PRAPARE - Hydrologist (Medical): No    Lack of Transportation (Non-Medical): No  Physical Activity: Not on file  Stress: Not on file  Social Connections: Not on file  Intimate Partner  Violence: Not At Risk (03/08/2022)   Humiliation, Afraid, Rape, and Kick questionnaire    Fear of Current or Ex-Partner: No    Emotionally Abused: No    Physically Abused: No    Sexually Abused: No    Review of Systems  All other systems reviewed and are negative.       Objective    BP 114/78 (BP Location: Left Arm, Patient Position: Sitting, Cuff Size: Large)   Pulse 89   Temp 98.3 F (36.8 C)   Resp 16   Wt 215 lb (97.5 kg)   SpO2 98%   BMI 31.75 kg/m   Physical Exam Vitals and nursing note reviewed.  Constitutional:      General: He is not in acute distress. Cardiovascular:     Rate and Rhythm: Normal rate and regular rhythm.  Pulmonary:     Effort: Pulmonary effort is normal.     Breath sounds: Normal breath sounds.  Abdominal:     Palpations: Abdomen is soft.     Tenderness: There is abdominal tenderness.  Musculoskeletal:     Lumbar back: Tenderness present. Decreased range of motion.     Comments: Patient with back brace on  Neurological:     General: No focal deficit present.     Mental Status: He is alert and oriented to person, place, and time.         Assessment & Plan:   1. Hospital discharge follow-up   2. Anxiety and depression Cymbalta and clonazepam prescribed  3. Multiple myeloma not having achieved remission (HCC) Management as per consultant  4. Closed fracture of proximal end of left humerus with routine healing, unspecified fracture morphology, subsequent encounter Management as per consultant  5. Essential hypertension Appears stable.  continue  6. GERD without esophagitis continue  7. Mixed hyperlipidemia continue  Return in about 6 weeks (around 06/03/2022) for follow up.   Wilson, Amelia P, MD   

## 2022-04-28 ENCOUNTER — Other Ambulatory Visit: Payer: Self-pay

## 2022-04-28 DIAGNOSIS — G894 Chronic pain syndrome: Secondary | ICD-10-CM | POA: Diagnosis not present

## 2022-04-28 DIAGNOSIS — K219 Gastro-esophageal reflux disease without esophagitis: Secondary | ICD-10-CM | POA: Diagnosis not present

## 2022-04-28 DIAGNOSIS — K5903 Drug induced constipation: Secondary | ICD-10-CM | POA: Diagnosis not present

## 2022-04-28 DIAGNOSIS — D630 Anemia in neoplastic disease: Secondary | ICD-10-CM | POA: Diagnosis not present

## 2022-04-28 DIAGNOSIS — C9000 Multiple myeloma not having achieved remission: Secondary | ICD-10-CM | POA: Diagnosis not present

## 2022-04-28 DIAGNOSIS — M109 Gout, unspecified: Secondary | ICD-10-CM | POA: Diagnosis not present

## 2022-04-28 DIAGNOSIS — I10 Essential (primary) hypertension: Secondary | ICD-10-CM | POA: Diagnosis not present

## 2022-04-28 DIAGNOSIS — G629 Polyneuropathy, unspecified: Secondary | ICD-10-CM | POA: Diagnosis not present

## 2022-04-28 DIAGNOSIS — N179 Acute kidney failure, unspecified: Secondary | ICD-10-CM | POA: Diagnosis not present

## 2022-04-28 DIAGNOSIS — S42202D Unspecified fracture of upper end of left humerus, subsequent encounter for fracture with routine healing: Secondary | ICD-10-CM | POA: Diagnosis not present

## 2022-04-28 DIAGNOSIS — G35 Multiple sclerosis: Secondary | ICD-10-CM | POA: Diagnosis not present

## 2022-04-28 DIAGNOSIS — F17210 Nicotine dependence, cigarettes, uncomplicated: Secondary | ICD-10-CM | POA: Diagnosis not present

## 2022-04-28 DIAGNOSIS — E782 Mixed hyperlipidemia: Secondary | ICD-10-CM | POA: Diagnosis not present

## 2022-04-28 DIAGNOSIS — M4802 Spinal stenosis, cervical region: Secondary | ICD-10-CM | POA: Diagnosis not present

## 2022-04-28 DIAGNOSIS — T402X5D Adverse effect of other opioids, subsequent encounter: Secondary | ICD-10-CM | POA: Diagnosis not present

## 2022-04-28 DIAGNOSIS — M8458XD Pathological fracture in neoplastic disease, other specified site, subsequent encounter for fracture with routine healing: Secondary | ICD-10-CM | POA: Diagnosis not present

## 2022-04-28 LAB — MULTIPLE MYELOMA PANEL, SERUM
Albumin SerPl Elph-Mcnc: 3.2 g/dL (ref 2.9–4.4)
Albumin/Glob SerPl: 0.9 (ref 0.7–1.7)
Alpha 1: 0.3 g/dL (ref 0.0–0.4)
Alpha2 Glob SerPl Elph-Mcnc: 0.9 g/dL (ref 0.4–1.0)
B-Globulin SerPl Elph-Mcnc: 1 g/dL (ref 0.7–1.3)
Gamma Glob SerPl Elph-Mcnc: 1.7 g/dL (ref 0.4–1.8)
Globulin, Total: 4 g/dL — ABNORMAL HIGH (ref 2.2–3.9)
IgA: 75 mg/dL — ABNORMAL LOW (ref 90–386)
IgG (Immunoglobin G), Serum: 2095 mg/dL — ABNORMAL HIGH (ref 603–1613)
IgM (Immunoglobulin M), Srm: 82 mg/dL (ref 20–172)
M Protein SerPl Elph-Mcnc: 1.3 g/dL — ABNORMAL HIGH
Total Protein ELP: 7.2 g/dL (ref 6.0–8.5)

## 2022-04-28 MED ORDER — SENNA 8.6 MG PO TABS: 1.0000 | ORAL_TABLET | Freq: Two times a day (BID) | ORAL | 3 refills | Status: DC

## 2022-04-28 MED ORDER — DOCUSATE SODIUM 100 MG PO CAPS: 100.0000 mg | ORAL_CAPSULE | Freq: Two times a day (BID) | ORAL | 3 refills | Status: DC | PRN

## 2022-04-28 NOTE — Telephone Encounter (Signed)
Pt called stating he has increase in back pain and stomach pain, pt taking medications as prescribed, however, he has yet to pick up the Cymbalta and has not had  BM in 2 days and has a lot of gas. Per Lexine Baton, NP pt can take 1 extra dose of oxycodone today only, he is to pick up and take his cymbalta today and he is also to pick up and take senna twice a day. Pt is to continue with the rest of his medication regimen. Pt verbalized understanding, no further needs at this time.

## 2022-04-29 DIAGNOSIS — Z4789 Encounter for other orthopedic aftercare: Secondary | ICD-10-CM | POA: Diagnosis not present

## 2022-04-30 ENCOUNTER — Other Ambulatory Visit: Payer: Self-pay

## 2022-04-30 ENCOUNTER — Inpatient Hospital Stay: Payer: BC Managed Care – PPO

## 2022-04-30 ENCOUNTER — Inpatient Hospital Stay: Payer: BC Managed Care – PPO | Attending: Physician Assistant

## 2022-04-30 VITALS — BP 128/76 | HR 81 | Temp 98.4°F | Resp 17 | Wt 212.6 lb

## 2022-04-30 DIAGNOSIS — Z79624 Long term (current) use of inhibitors of nucleotide synthesis: Secondary | ICD-10-CM | POA: Diagnosis not present

## 2022-04-30 DIAGNOSIS — E785 Hyperlipidemia, unspecified: Secondary | ICD-10-CM | POA: Insufficient documentation

## 2022-04-30 DIAGNOSIS — K219 Gastro-esophageal reflux disease without esophagitis: Secondary | ICD-10-CM | POA: Insufficient documentation

## 2022-04-30 DIAGNOSIS — R112 Nausea with vomiting, unspecified: Secondary | ICD-10-CM | POA: Insufficient documentation

## 2022-04-30 DIAGNOSIS — F17210 Nicotine dependence, cigarettes, uncomplicated: Secondary | ICD-10-CM | POA: Insufficient documentation

## 2022-04-30 DIAGNOSIS — I10 Essential (primary) hypertension: Secondary | ICD-10-CM | POA: Diagnosis not present

## 2022-04-30 DIAGNOSIS — Z5112 Encounter for antineoplastic immunotherapy: Secondary | ICD-10-CM | POA: Insufficient documentation

## 2022-04-30 DIAGNOSIS — Z8042 Family history of malignant neoplasm of prostate: Secondary | ICD-10-CM | POA: Insufficient documentation

## 2022-04-30 DIAGNOSIS — Z7982 Long term (current) use of aspirin: Secondary | ICD-10-CM | POA: Insufficient documentation

## 2022-04-30 DIAGNOSIS — K5900 Constipation, unspecified: Secondary | ICD-10-CM | POA: Diagnosis not present

## 2022-04-30 DIAGNOSIS — Z7961 Long term (current) use of immunomodulator: Secondary | ICD-10-CM | POA: Diagnosis not present

## 2022-04-30 DIAGNOSIS — G893 Neoplasm related pain (acute) (chronic): Secondary | ICD-10-CM | POA: Insufficient documentation

## 2022-04-30 DIAGNOSIS — Z79899 Other long term (current) drug therapy: Secondary | ICD-10-CM | POA: Diagnosis not present

## 2022-04-30 DIAGNOSIS — C9000 Multiple myeloma not having achieved remission: Secondary | ICD-10-CM | POA: Insufficient documentation

## 2022-04-30 DIAGNOSIS — Z86010 Personal history of colonic polyps: Secondary | ICD-10-CM | POA: Insufficient documentation

## 2022-04-30 LAB — CMP (CANCER CENTER ONLY)
ALT: 16 U/L (ref 0–44)
AST: 13 U/L — ABNORMAL LOW (ref 15–41)
Albumin: 3.8 g/dL (ref 3.5–5.0)
Alkaline Phosphatase: 136 U/L — ABNORMAL HIGH (ref 38–126)
Anion gap: 8 (ref 5–15)
BUN: 9 mg/dL (ref 6–20)
CO2: 24 mmol/L (ref 22–32)
Calcium: 8.9 mg/dL (ref 8.9–10.3)
Chloride: 101 mmol/L (ref 98–111)
Creatinine: 0.71 mg/dL (ref 0.61–1.24)
GFR, Estimated: >60 mL/min (ref >60)
Glucose, Bld: 108 mg/dL — ABNORMAL HIGH (ref 70–99)
Potassium: 3.9 mmol/L (ref 3.5–5.1)
Sodium: 133 mmol/L — ABNORMAL LOW (ref 135–145)
Total Bilirubin: 0.4 mg/dL (ref 0.3–1.2)
Total Protein: 7.8 g/dL (ref 6.5–8.1)

## 2022-04-30 LAB — CBC WITH DIFFERENTIAL (CANCER CENTER ONLY)
Abs Immature Granulocytes: 0.02 10*3/uL (ref 0.00–0.07)
Basophils Absolute: 0 10*3/uL (ref 0.0–0.1)
Basophils Relative: 0 %
Eosinophils Absolute: 0 10*3/uL (ref 0.0–0.5)
Eosinophils Relative: 0 %
HCT: 33.5 % — ABNORMAL LOW (ref 39.0–52.0)
Hemoglobin: 10.9 g/dL — ABNORMAL LOW (ref 13.0–17.0)
Immature Granulocytes: 1 %
Lymphocytes Relative: 36 %
Lymphs Abs: 1.2 10*3/uL (ref 0.7–4.0)
MCH: 29.1 pg (ref 26.0–34.0)
MCHC: 32.5 g/dL (ref 30.0–36.0)
MCV: 89.6 fL (ref 80.0–100.0)
Monocytes Absolute: 0.1 10*3/uL (ref 0.1–1.0)
Monocytes Relative: 4 %
Neutro Abs: 1.9 10*3/uL (ref 1.7–7.7)
Neutrophils Relative %: 59 %
Platelet Count: 194 10*3/uL (ref 150–400)
RBC: 3.74 MIL/uL — ABNORMAL LOW (ref 4.22–5.81)
RDW: 14.3 % (ref 11.5–15.5)
WBC Count: 3.3 10*3/uL — ABNORMAL LOW (ref 4.0–10.5)
nRBC: 0 % (ref 0.0–0.2)

## 2022-04-30 MED ORDER — DEXAMETHASONE 4 MG PO TABS
40.0000 mg | ORAL_TABLET | Freq: Once | ORAL | Status: AC
  Administered 2022-04-30: 40 mg via ORAL
  Filled 2022-04-30: qty 10

## 2022-04-30 MED ORDER — BORTEZOMIB CHEMO SQ INJECTION 3.5 MG (2.5MG/ML)
1.3000 mg/m2 | Freq: Once | INTRAMUSCULAR | Status: AC
  Administered 2022-04-30: 3 mg via SUBCUTANEOUS
  Filled 2022-04-30: qty 1.2

## 2022-04-30 NOTE — Patient Instructions (Signed)
[facility]  Discharge Instructions: Thank you for choosing [facility] to provide your oncology and hematology care.   If you have a lab appointment with the [facility], please go directly to the [facility] and check in at the registration area.   Wear comfortable clothing and clothing appropriate for easy access to any Portacath or PICC line.   We strive to give you quality time with your provider. You may need to reschedule your appointment if you arrive late (15 or more minutes).  Arriving late affects you and other patients whose appointments are after yours.  Also, if you miss three or more appointments without notifying the office, you may be dismissed from the clinic at the provider's discretion.      For prescription refill requests, have your pharmacy contact our office and allow 72 hours for refills to be completed.    Today you received the following chemotherapy and/or immunotherapy agents: Velcade      To help prevent nausea and vomiting after your treatment, we encourage you to take your nausea medication as directed.  BELOW ARE SYMPTOMS THAT SHOULD BE REPORTED IMMEDIATELY: *FEVER GREATER THAN 100.4 F (38 C) OR HIGHER *CHILLS OR SWEATING *NAUSEA AND VOMITING THAT IS NOT CONTROLLED WITH YOUR NAUSEA MEDICATION *UNUSUAL SHORTNESS OF BREATH *UNUSUAL BRUISING OR BLEEDING *URINARY PROBLEMS (pain or burning when urinating, or frequent urination) *BOWEL PROBLEMS (unusual diarrhea, constipation, pain near the anus) TENDERNESS IN MOUTH AND THROAT WITH OR WITHOUT PRESENCE OF ULCERS (sore throat, sores in mouth, or a toothache) UNUSUAL RASH, SWELLING OR PAIN  UNUSUAL VAGINAL DISCHARGE OR ITCHING   Items with * indicate a potential emergency and should be followed up as soon as possible or go to the Emergency Department if any problems should occur.  Please show the CHEMOTHERAPY ALERT CARD or IMMUNOTHERAPY ALERT CARD at check-in to  the Emergency Department and triage nurse.  Should you have questions after your visit or need to cancel or reschedule your appointment, please contact [facility]  Dept: [phone]  and follow the prompts.  Office hours are 8:00 a.m. to 4:30 p.m. Monday - Friday. Please note that voicemails left after 4:00 p.m. may not be returned until the following business day.  We are closed weekends and major holidays. You have access to a nurse at all times for urgent questions. Please call the main number to the clinic Dept: [phone] and follow the prompts.   For any non-urgent questions, you may also contact your provider using MyChart. We now offer e-Visits for anyone 51 and older to request care online for non-urgent symptoms. For details visit [email or web address].   Also download the MyChart app! Go to the app store, search "MyChart", open the app, select [facility], and log in with your MyChart username and password.  Masks are optional in the cancer centers. If you would like for your care team to wear a mask while they are taking care of you, please let them know. You may have one support Jazleen Robeck who is at least 53 years old accompany you for your appointments.

## 2022-05-01 DIAGNOSIS — G629 Polyneuropathy, unspecified: Secondary | ICD-10-CM | POA: Diagnosis not present

## 2022-05-01 DIAGNOSIS — T402X5D Adverse effect of other opioids, subsequent encounter: Secondary | ICD-10-CM | POA: Diagnosis not present

## 2022-05-01 DIAGNOSIS — S42202D Unspecified fracture of upper end of left humerus, subsequent encounter for fracture with routine healing: Secondary | ICD-10-CM | POA: Diagnosis not present

## 2022-05-01 DIAGNOSIS — D630 Anemia in neoplastic disease: Secondary | ICD-10-CM | POA: Diagnosis not present

## 2022-05-01 DIAGNOSIS — M8458XD Pathological fracture in neoplastic disease, other specified site, subsequent encounter for fracture with routine healing: Secondary | ICD-10-CM | POA: Diagnosis not present

## 2022-05-01 DIAGNOSIS — G35 Multiple sclerosis: Secondary | ICD-10-CM | POA: Diagnosis not present

## 2022-05-01 DIAGNOSIS — K219 Gastro-esophageal reflux disease without esophagitis: Secondary | ICD-10-CM | POA: Diagnosis not present

## 2022-05-01 DIAGNOSIS — N179 Acute kidney failure, unspecified: Secondary | ICD-10-CM | POA: Diagnosis not present

## 2022-05-01 DIAGNOSIS — F17210 Nicotine dependence, cigarettes, uncomplicated: Secondary | ICD-10-CM | POA: Diagnosis not present

## 2022-05-01 DIAGNOSIS — M109 Gout, unspecified: Secondary | ICD-10-CM | POA: Diagnosis not present

## 2022-05-01 DIAGNOSIS — M4802 Spinal stenosis, cervical region: Secondary | ICD-10-CM | POA: Diagnosis not present

## 2022-05-01 DIAGNOSIS — K5903 Drug induced constipation: Secondary | ICD-10-CM | POA: Diagnosis not present

## 2022-05-01 DIAGNOSIS — I10 Essential (primary) hypertension: Secondary | ICD-10-CM | POA: Diagnosis not present

## 2022-05-01 DIAGNOSIS — G894 Chronic pain syndrome: Secondary | ICD-10-CM | POA: Diagnosis not present

## 2022-05-01 DIAGNOSIS — C9000 Multiple myeloma not having achieved remission: Secondary | ICD-10-CM | POA: Diagnosis not present

## 2022-05-01 DIAGNOSIS — E782 Mixed hyperlipidemia: Secondary | ICD-10-CM | POA: Diagnosis not present

## 2022-05-03 DIAGNOSIS — R2689 Other abnormalities of gait and mobility: Secondary | ICD-10-CM | POA: Diagnosis not present

## 2022-05-03 DIAGNOSIS — C9000 Multiple myeloma not having achieved remission: Secondary | ICD-10-CM | POA: Diagnosis not present

## 2022-05-03 DIAGNOSIS — M4802 Spinal stenosis, cervical region: Secondary | ICD-10-CM | POA: Diagnosis not present

## 2022-05-03 DIAGNOSIS — M6281 Muscle weakness (generalized): Secondary | ICD-10-CM | POA: Diagnosis not present

## 2022-05-05 ENCOUNTER — Telehealth: Payer: Self-pay | Admitting: Hematology and Oncology

## 2022-05-05 NOTE — Telephone Encounter (Signed)
Called patient to confirm times for appointments. Patient notified.

## 2022-05-07 ENCOUNTER — Other Ambulatory Visit: Payer: Self-pay | Admitting: *Deleted

## 2022-05-07 ENCOUNTER — Inpatient Hospital Stay: Payer: BC Managed Care – PPO

## 2022-05-07 ENCOUNTER — Inpatient Hospital Stay (HOSPITAL_BASED_OUTPATIENT_CLINIC_OR_DEPARTMENT_OTHER): Payer: BC Managed Care – PPO | Admitting: Nurse Practitioner

## 2022-05-07 ENCOUNTER — Encounter: Payer: Self-pay | Admitting: Nurse Practitioner

## 2022-05-07 ENCOUNTER — Inpatient Hospital Stay (HOSPITAL_BASED_OUTPATIENT_CLINIC_OR_DEPARTMENT_OTHER): Payer: BC Managed Care – PPO | Admitting: Hematology and Oncology

## 2022-05-07 DIAGNOSIS — C9000 Multiple myeloma not having achieved remission: Secondary | ICD-10-CM

## 2022-05-07 DIAGNOSIS — Z79624 Long term (current) use of inhibitors of nucleotide synthesis: Secondary | ICD-10-CM | POA: Diagnosis not present

## 2022-05-07 DIAGNOSIS — K5900 Constipation, unspecified: Secondary | ICD-10-CM | POA: Diagnosis not present

## 2022-05-07 DIAGNOSIS — K30 Functional dyspepsia: Secondary | ICD-10-CM

## 2022-05-07 DIAGNOSIS — G893 Neoplasm related pain (acute) (chronic): Secondary | ICD-10-CM | POA: Diagnosis not present

## 2022-05-07 DIAGNOSIS — K219 Gastro-esophageal reflux disease without esophagitis: Secondary | ICD-10-CM | POA: Diagnosis not present

## 2022-05-07 DIAGNOSIS — Z7961 Long term (current) use of immunomodulator: Secondary | ICD-10-CM | POA: Diagnosis not present

## 2022-05-07 DIAGNOSIS — R112 Nausea with vomiting, unspecified: Secondary | ICD-10-CM | POA: Diagnosis not present

## 2022-05-07 DIAGNOSIS — Z86010 Personal history of colonic polyps: Secondary | ICD-10-CM | POA: Diagnosis not present

## 2022-05-07 DIAGNOSIS — I10 Essential (primary) hypertension: Secondary | ICD-10-CM | POA: Diagnosis not present

## 2022-05-07 DIAGNOSIS — E785 Hyperlipidemia, unspecified: Secondary | ICD-10-CM | POA: Diagnosis not present

## 2022-05-07 DIAGNOSIS — Z79899 Other long term (current) drug therapy: Secondary | ICD-10-CM | POA: Diagnosis not present

## 2022-05-07 DIAGNOSIS — Z5112 Encounter for antineoplastic immunotherapy: Secondary | ICD-10-CM | POA: Diagnosis not present

## 2022-05-07 DIAGNOSIS — Z515 Encounter for palliative care: Secondary | ICD-10-CM | POA: Diagnosis not present

## 2022-05-07 DIAGNOSIS — Z7982 Long term (current) use of aspirin: Secondary | ICD-10-CM | POA: Diagnosis not present

## 2022-05-07 DIAGNOSIS — Z8042 Family history of malignant neoplasm of prostate: Secondary | ICD-10-CM | POA: Diagnosis not present

## 2022-05-07 DIAGNOSIS — F17210 Nicotine dependence, cigarettes, uncomplicated: Secondary | ICD-10-CM | POA: Diagnosis not present

## 2022-05-07 LAB — CBC WITH DIFFERENTIAL (CANCER CENTER ONLY)
Abs Immature Granulocytes: 0.02 10*3/uL (ref 0.00–0.07)
Basophils Absolute: 0 10*3/uL (ref 0.0–0.1)
Basophils Relative: 0 %
Eosinophils Absolute: 0 10*3/uL (ref 0.0–0.5)
Eosinophils Relative: 0 %
HCT: 32.1 % — ABNORMAL LOW (ref 39.0–52.0)
Hemoglobin: 10.3 g/dL — ABNORMAL LOW (ref 13.0–17.0)
Immature Granulocytes: 0 %
Lymphocytes Relative: 35 %
Lymphs Abs: 1.6 10*3/uL (ref 0.7–4.0)
MCH: 28.7 pg (ref 26.0–34.0)
MCHC: 32.1 g/dL (ref 30.0–36.0)
MCV: 89.4 fL (ref 80.0–100.0)
Monocytes Absolute: 0.5 10*3/uL (ref 0.1–1.0)
Monocytes Relative: 11 %
Neutro Abs: 2.5 10*3/uL (ref 1.7–7.7)
Neutrophils Relative %: 54 %
Platelet Count: 208 10*3/uL (ref 150–400)
RBC: 3.59 MIL/uL — ABNORMAL LOW (ref 4.22–5.81)
RDW: 13.9 % (ref 11.5–15.5)
WBC Count: 4.6 10*3/uL (ref 4.0–10.5)
nRBC: 0 % (ref 0.0–0.2)

## 2022-05-07 LAB — CMP (CANCER CENTER ONLY)
ALT: 20 U/L (ref 0–44)
AST: 14 U/L — ABNORMAL LOW (ref 15–41)
Albumin: 3.8 g/dL (ref 3.5–5.0)
Alkaline Phosphatase: 109 U/L (ref 38–126)
Anion gap: 5 (ref 5–15)
BUN: 5 mg/dL — ABNORMAL LOW (ref 6–20)
CO2: 28 mmol/L (ref 22–32)
Calcium: 8 mg/dL — ABNORMAL LOW (ref 8.9–10.3)
Chloride: 101 mmol/L (ref 98–111)
Creatinine: 0.74 mg/dL (ref 0.61–1.24)
GFR, Estimated: >60 mL/min (ref >60)
Glucose, Bld: 97 mg/dL (ref 70–99)
Potassium: 3.9 mmol/L (ref 3.5–5.1)
Sodium: 134 mmol/L — ABNORMAL LOW (ref 135–145)
Total Bilirubin: 0.5 mg/dL (ref 0.3–1.2)
Total Protein: 7.3 g/dL (ref 6.5–8.1)

## 2022-05-07 MED ORDER — BORTEZOMIB CHEMO SQ INJECTION 3.5 MG (2.5MG/ML)
1.3000 mg/m2 | Freq: Once | INTRAMUSCULAR | Status: AC
  Administered 2022-05-07: 3 mg via SUBCUTANEOUS
  Filled 2022-05-07: qty 1.2

## 2022-05-07 MED ORDER — LENALIDOMIDE 25 MG PO CAPS: 25.0000 mg | ORAL_CAPSULE | Freq: Every day | ORAL | 0 refills | Status: DC

## 2022-05-07 MED ORDER — DEXAMETHASONE 4 MG PO TABS
40.0000 mg | ORAL_TABLET | Freq: Once | ORAL | Status: AC
  Administered 2022-05-07: 40 mg via ORAL
  Filled 2022-05-07: qty 10

## 2022-05-07 NOTE — Progress Notes (Signed)
[place] [phone]   [phone]  PROGRESS NOTE  Patient Care Team: [name], MD as PCP - General (Family Medicine) Lorretta Harp, MD as PCP - Cardiology (Cardiology) Gala Romney Cristopher Estimable, MD as Attending Physician (Gastroenterology)  Hematological/Oncological History # IgG Kappa Multiple Myeloma, 1p32/1q21.  01/04/2022: MRI lumbar spine shows diffuse heterogeneous marrow signal with heterogeneous enhancement throughout the thoracolumbar spine.  Additionally, there is subacute-chronic L3 vertebral body compression fraction. 01/14/2022: establish care with Dr. Lorenso Courier  02/10/2022: bone marrow biopsy shows range of plasma cell involvement from 50% on the biopsy to 60% on aspirate smear slides and close to 90% on the clot section for an overall percentage estimated at approximately 80% overall. 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  02/26/2022: Cycle 1 of VRD therapy 04/01/2022: Cycle 2 of VRD therapy  04/23/2022: Cycle 3 of VRD therapy   Interval History:  [name] 51 y.o. male with medical history significant for newly diagnosed IgG kappa multiple myeloma who presents for a follow up visit. The patient's last visit was on 04/23/2022 at which time he started VRD therapy. In the interim, he was admitted for intractable low back pain. He presents today to start Cycle 3, Day 15 of VRD therapy.   On exam today [name] reports he has been feeling "okay" in the interim since our last visit.  He notes that he is having some back pain and tightness.  He notes that his energy has been good and he currently ranks it as a 7 or 8 out of 10.  His appetite is been strong.  He does have some occasional cramps but no numbness or tingling of the fingers or toes.  He notes that if he does get some numbness he wiggles his toes and it goes away.  He has had some nausea but no vomiting.  He has had some symptoms of acid  reflux as well.  Overall he is willing and able to proceed with treatment at this time.  He denies fevers, chills, sweats, shortness of breath, chest pain or cough. He has no other complaints. A full 10 point ROS was otherwise negative.  MEDICAL HISTORY:  Past Medical History:  Diagnosis Date   Allergy    Anemia    Atypical chest pain    Cancer (HCC)    Multiple myeloma with normocytic anemia   GERD (gastroesophageal reflux disease)    Hepatic cyst    Benign by MRI   Hiatal hernia    History of colonic polyps    Hyperlipidemia    Hypertension    Rectal bleeding    Tobacco abuse     SURGICAL HISTORY: Past Surgical History:  Procedure Laterality Date   COLONOSCOPY  01/10/2009     RMR: Normal rectum, normal colon; repeat in 2015 due to [place] of colon cancer   COLONOSCOPY N/A 01/09/2014   GPQ:DIYMEBRA colonic polyps-removed as described   COLONOSCOPY N/A 09/18/2016   Procedure: COLONOSCOPY;  Surgeon: Daneil Dolin, MD;  [facility];  Service: Endoscopy;  Laterality: N/A;  730    ESOPHAGOGASTRODUODENOSCOPY  10/04/2007   RMR: Distal esophageal erosions consistent with erosive reflux esophagitis, patulous gastroesophageal junction status post passage of a  Mal[place] dilator, 48 [place].  Otherwise, unremarkable esophagus.  Hiatal hernia.  Otherwise normal stomach.  Bulbar erosion   ESOPHAGOGASTRODUODENOSCOPY N/A 01/09/2014   Erosive reflux esophagitis. Small hiatal hernia   HUMERUS IM NAIL Left 02/22/2022   Procedure:  INTRAMEDULLARY (IM) NAIL HUMERAL;  Surgeon: Nicholes Stairs, MD;  Location: [place] [facility];  Service: Orthopedics;  Laterality: Left;   I & D EXTREMITY Left 07/26/2018   Procedure: IRRIGATION AND DEBRIDEMENT EXTREMITY;  Surgeon: Dayna Barker, MD;  Location: [place];  Service: Plastics;  Laterality: Left;   IR BONE TUMOR(S)RF ABLATION  02/05/2022   IR BONE TUMOR(S)RF ABLATION  02/24/2022   IR KYPHO EA ADDL LEVEL THORACIC OR LUMBAR  02/19/2022   IR KYPHO LUMBAR INC FX REDUCE  BONE BX UNI/BIL CANNULATION INC/IMAGING  02/05/2022   IR KYPHO LUMBAR INC FX REDUCE BONE BX UNI/BIL CANNULATION INC/IMAGING  02/19/2022   PERCUTANEOUS PINNING Left 07/26/2018   Procedure: PERCUTANEOUS PINNING EXTREMITY;  Surgeon: Dayna Barker, MD;  Location: [facility];  Service: Plastics;  Laterality: Left;    SOCIAL HISTORY: Social History   Socioeconomic History   Marital status: Single    Spouse name: Not on file   Number of children: 2   Years of education: Not on file   Highest education level: Not on file  Occupational History   Occupation: Scientist, 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[place] Use   Smoking status: Every Day    Packs/day: 0.25    Years: 10.00    Total pack years: 2.50    Types: Cigarettes   Smokeless tobacco: Never  Vaping Use   Vaping Use: Never used  Substance and Sexual Activity   Alcohol use: Yes    Alcohol/week: 0.0 standard drinks of alcohol    Comment: occasional/wine on the weekends   Drug use: No   Sexual activity: Not on file  Other Topics Concern   Not on file  Social History Narrative   Right handed   Lives alone one story home   Social Determinants of Health   Financial Resource Strain: Not on file  Food Insecurity: No Food Insecurity (03/08/2022)   Hunger Vital Sign    Worried About Running Out of Food in the Last Year: Never true    Ran Out of Food in the Last Year: Never true  Transportation Needs: No Transportation Needs (03/08/2022)   PRAPARE - Hydrologist (Medical): No    Lack of Transportation (Non-Medical): No  Physical Activity: Not on file  Stress: Not on file  Social Connections: Not on file  Intimate Partner Violence: Not At Risk (03/08/2022)   Humiliation, Afraid, Rape, and Kick questionnaire    Fear of Current or Ex-Partner: No    Emotionally Abused: No    Physically Abused: No    Sexually Abused: No    FAMILY HISTORY: Family History  Problem  Relation Age of Onset   Heart disease Mother    Hypertension Mother    Diabetes Father    Hypertension Father    Colon cancer Sister        passed away from colon cancer, in her 61s   Heart attack Maternal Uncle    Prostate cancer Maternal Uncle    Diabetes Other    Pancreatic cancer Neg Hx    Rectal cancer Neg Hx    Esophageal cancer Neg Hx    Stomach cancer Neg Hx     ALLERGIES:  has No Known Allergies.  MEDICATIONS:  Current Outpatient Medications  Medication Sig Dispense Refill   acetaminophen (TYLENOL) 500 MG tablet Take 1 tablet (500 mg total) by mouth every 8 (eight) hours as needed for mild pain  or moderate pain. 30 tablet 0   acyclovir (ZOVIRAX) 400 MG tablet Take 1 tablet (400 mg total) by mouth 2 (two) times daily. 60 tablet 3   allopurinol (ZYLOPRIM) 300 MG tablet Take 1 tablet (300 mg total) by mouth daily. 30 tablet 3   amLODipine (NORVASC) 5 MG tablet Take 1 tablet (5 mg total) by mouth daily.     aspirin EC 81 MG tablet Take 81 mg by mouth daily. Swallow whole.     clonazePAM (KLONOPIN) 0.5 MG tablet Take 1 tablet (0.5 mg total) by mouth 2 (two) times daily as needed for anxiety. 15 tablet 0   docusate sodium (COLACE) 100 MG capsule Take 1 capsule (100 mg total) by mouth 2 (two) times daily as needed for mild constipation. 30 capsule 3   DULoxetine (CYMBALTA) 20 MG capsule Take 1 capsule (20 mg total) by mouth daily. 30 capsule 1   gabapentin (NEURONTIN) 100 MG capsule Take 1 capsule (100 mg total) by mouth 2 (two) times daily. 60 capsule 1   lenalidomide (REVLIMID) 25 MG capsule Take 1 capsule (25 mg total) by mouth daily. Take for 14 days and then none for 7 days. Repeat every 21 days. [employer] Auth # [id number] Date Obtained  03/25/22 14 capsule 0   Methocarbamol 1000 MG TABS Take 1,000 mg by mouth every 6 (six) hours as needed. 45 tablet 1   morphine (MS CONTIN) 60 MG 12 hr tablet Take 1 tablet (60 mg total) by mouth every 8 (eight) hours. 90 tablet 0   ondansetron  (ZOFRAN) 8 MG tablet Take 1 tablet (8 mg total) by mouth every 8 (eight) hours as needed for nausea or vomiting. 60 tablet 0   Oxycodone HCl 20 MG TABS Take 1 tablet (20 mg total) by mouth every 6 (six) hours as needed. 60 tablet 0   pantoprazole (PROTONIX) 40 MG tablet Take 1 tablet (40 mg total) by mouth 2 (two) times daily. 60 tablet 2   polyethylene glycol powder (GLYCOLAX/MIRALAX) 17 GM/SCOOP powder Take 1 capful (17 g) with water by mouth daily. 238 g 0   prochlorperazine (COMPAZINE) 10 MG tablet Take 1 tablet (10 mg total) by mouth every 6 (six) hours as needed for nausea or vomiting. 60 tablet 0   rosuvastatin (CRESTOR) 10 MG tablet Take 10 mg by mouth daily.     senna (SENOKOT) 8.6 MG TABS tablet Take 1 tablet (8.6 mg total) by mouth in the morning and at bedtime. 120 tablet 3   No current facility-administered medications for this visit.    REVIEW OF SYSTEMS:   Constitutional: ( - ) fevers, ( - )  chills , ( - ) night sweats Eyes: ( - ) blurriness of vision, ( - ) double vision, ( - ) watery eyes Ears, nose, mouth, throat, and face: ( - ) mucositis, ( - ) sore throat Respiratory: ( - ) cough, ( - ) dyspnea, ( - ) wheezes Cardiovascular: ( - ) palpitation, ( - ) chest discomfort, ( - ) lower extremity swelling Gastrointestinal:  ( - ) nausea, ( - ) heartburn, ( - ) change in bowel habits Skin: ( - ) abnormal skin rashes Lymphatics: ( - ) new lymphadenopathy, ( - ) easy bruising Neurological: ( - ) numbness, ( - ) tingling, ( - ) new weaknesses Behavioral/Psych: ( - ) mood change, ( - ) new changes  All other systems were reviewed with the patient and are negative.  PHYSICAL EXAMINATION: ECOG PERFORMANCE STATUS:  1 - Symptomatic but completely ambulatory  Vitals:   05/07/22 0854  BP: 110/68  Pulse: 81  Resp: 15  Temp: 98.6 F (37 C)  SpO2: 99%   Filed Weights   05/07/22 0854  Weight: 219 lb 1.6 oz (99.4 kg)    GENERAL: Well-appearing young African-American male, alert,  no distress and comfortable SKIN: skin color, texture, turgor are normal, no rashes or significant lesions EYES: conjunctiva are pink and non-injected, sclera clear LUNGS: clear to auscultation and percussion with normal breathing effort HEART: regular rate & rhythm and no murmurs and no lower extremity edema Musculoskeletal: no cyanosis of digits and no clubbing  PSYCH: alert & oriented x 3, fluent speech NEURO: no focal motor/sensory deficits  LABORATORY DATA:  I have reviewed the data as listed    Latest Ref Rng & Units 05/07/2022    8:22 AM 04/30/2022    7:56 AM 04/23/2022    8:44 AM  CBC  WBC 4.0 - 10.5 K/uL 4.6  3.3  4.3   Hemoglobin 13.0 - 17.0 g/dL 10.3  10.9  10.1   Hematocrit 39.0 - 52.0 % 32.1  33.5  31.1   Platelets 150 - 400 K/uL 208  194  238        Latest Ref Rng & Units 05/07/2022    8:22 AM 04/30/2022    7:56 AM 04/23/2022    8:44 AM  CMP  Glucose 70 - 99 mg/dL 97  108  109   BUN 6 - 20 mg/dL _0 Creatinine 0.61 - 1.24 mg/dL 0.74  0.71  0.90   Sodium 135 - 145 mmol/L 134  133  135   Potassium 3.5 - 5.1 mmol/L 3.9  3.9  4.3   Chloride 98 - 111 mmol/L 101  101  105   CO2 22 - 32 mmol/L _1 Calcium 8.9 - 10.3 mg/dL 8.0  8.9  7.8   Total Protein 6.5 - 8.1 g/dL 7.3  7.8  8.2   Total Bilirubin 0.3 - 1.2 mg/dL 0.5  0.4  0.3   Alkaline Phos 38 - 126 U/L 109  136  144   AST 15 - 41 U/L _2 ALT 0 - 44 U/L _3 Lab Results  Component Value Date   MPROTEIN 1.3 (H) 04/23/2022   MPROTEIN 1.9 (H) 04/02/2022   MPROTEIN 3.1 (H) 01/14/2022   Lab Results  Component Value Date   KPAFRELGTCHN 44.2 (H) 04/23/2022   KPAFRELGTCHN 90.1 (H) 04/02/2022   KPAFRELGTCHN 92.0 (H) 01/14/2022   LAMBDASER 11.1 04/23/2022   LAMBDASER 11.7 04/02/2022   LAMBDASER 9.4 01/14/2022   KAPLAMBRATIO 3.98 (H) 04/23/2022   KAPLAMBRATIO 7.70 (H) 04/02/2022   KAPLAMBRATIO 57.71 (H) 01/17/2022   RADIOGRAPHIC STUDIES: No results found.  ASSESSMENT &  PLAN [name] 51 y.o. male with medical history significant for newly diagnosed IgG kappa multiple myeloma who presents for a follow up visit.  After review of the labs, review the records, discussion with the patient the findings are most consistent with newly diagnosed multiple myeloma.  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  At this time would recommend proceeding with VRD chemotherapy with the intention of referral for ASCT when his labs are appropriate.  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  -- Diagnosis confirmed with lytic lesions  of the spine as well as biopsy-proven plasmacytoma with 80% plasma cell involvement of the bone marrow. -- Recommend VRD chemotherapy with intention of proceeding to transplant. -- Labs at each visit to include CBC, CMP, LDH with monthly restaging labs SPEP and serum free light chains -- Started VRD therapy on 02/26/2022.  PLAN: --Presents today to start Cycle 3, Day 15 of VRD therapy --Labs from today were reviewed and adequate for treatment. WBC 4.6, Hgb 10.3, MCV 89.4, Plt 208 --Proceed with treatment today as planned without any dose modifications --Continue with weekly treatments and follow up in clinic in 2 weeks.   #Pathologic fractures-secondary to MM: --Involving L1, L2 and L3 compression fracture and left humerus fracture.  --Underwent kyphoplasty of L3 fracture on 02/19/2022 --Underwent medullary nailing of left humeral shaft on 02/22/2022  #Pain Medication -- Per palliative care continue on MS contin 60 mg q 8 hours, oxycodone 20 mg q 6 hours, change robaxin 1000 mg QID.  --Appreciate assistance of palliative care NP for further management of pain and other chronic symptoms.   #Supportive Care -- chemotherapy education complete -- port placement not required -- zofran 54m q8H PRN and compazine 177mPO q6H for nausea -- acyclovir 40048mO BID for VCZ prophylaxis -- allopurinol  300m26m daily for TLS prophylaxis -- Dental clearance for Zometa/Denosumab approved, requested at least 6 weeks from dental work on 02/25/2022. OK to proceed with Denosumab at next visit.   No orders of the defined types were placed in this encounter.   All questions were answered. The patient knows to call the clinic with any problems, questions or concerns.  I have spent a total of 30 minutes minutes of face-to-face and non-face-to-face time, preparing to see the [facility] appropriate examination, counseling and educating the patient, ordering medications/tests, documenting clinical information in the electronic health record,  and care coordination.   JohnLedell Peoples Department of Hematology/Oncology [facility]: [phone]: [phone]: [email or web address]   05/07/2022 9:13 AM

## 2022-05-07 NOTE — Progress Notes (Signed)
[place]  [phone]) [phone] [phone]) [phone]   Name: [name] Date: 05/07/2022 MRN: [id number]  DOB: 01-11-1971  Patient Care Team: [name], MD as PCP - General (Family Medicine) Lorretta Harp, MD as PCP - Cardiology (Cardiology) Gala Romney Cristopher Estimable, MD as Attending Physician (Gastroenterology)    INTERVAL HISTORY: [name] is a 51 y.o. male with oncologic medical history including IgG Kappa Multiple Myeloma, L1, L2, and L3 vertebral compression fraction s/p kyphoplasty(9/27) and IM nailing of left humeral shaft (9/30), currently undergoing VRD therapy.  Palliative ask to see for symptom management.   SOCIAL HISTORY:     reports that he has been smoking cigarettes. He has a 2.50 pack-year smoking history. He has never used smokeless tobacco. He reports current alcohol use. He reports that he does not use drugs.  ADVANCE DIRECTIVES:    CODE STATUS:   PAST MEDICAL HISTORY: Past Medical History:  Diagnosis Date   Allergy    Anemia    Atypical chest pain    Cancer (HCC)    Multiple myeloma with normocytic anemia   GERD (gastroesophageal reflux disease)    Hepatic cyst    Benign by MRI   Hiatal hernia    History of colonic polyps    Hyperlipidemia    Hypertension    Rectal bleeding    Tobacco abuse     ALLERGIES:  has No Known Allergies.  MEDICATIONS:  Current Outpatient Medications  Medication Sig Dispense Refill   acetaminophen (TYLENOL) 500 MG tablet Take 1 tablet (500 mg total) by mouth every 8 (eight) hours as needed for mild pain or moderate pain. 30 tablet 0   acyclovir (ZOVIRAX) 400 MG tablet Take 1 tablet (400 mg total) by mouth 2 (two) times daily. 60 tablet 3   allopurinol (ZYLOPRIM) 300 MG tablet Take 1 tablet (300 mg total) by mouth daily. 30 tablet 3   amLODipine (NORVASC) 5 MG tablet Take 1 tablet (5 mg total) by mouth daily.     aspirin EC 81 MG tablet Take 81 mg by mouth daily.  Swallow whole.     clonazePAM (KLONOPIN) 0.5 MG tablet Take 1 tablet (0.5 mg total) by mouth 2 (two) times daily as needed for anxiety. 15 tablet 0   docusate sodium (COLACE) 100 MG capsule Take 1 capsule (100 mg total) by mouth 2 (two) times daily as needed for mild constipation. 30 capsule 3   DULoxetine (CYMBALTA) 20 MG capsule Take 1 capsule (20 mg total) by mouth daily. 30 capsule 1   gabapentin (NEURONTIN) 100 MG capsule Take 1 capsule (100 mg total) by mouth 2 (two) times daily. 60 capsule 1   lenalidomide (REVLIMID) 25 MG capsule Take 1 capsule (25 mg total) by mouth daily. Take for 14 days then none for 7 days. Repeat every 21 days. [employer] Auth # [id number] Date Obtained  05/07/22 14 capsule 0   Methocarbamol 1000 MG TABS Take 1,000 mg by mouth every 6 (six) hours as needed. 45 tablet 1   morphine (MS CONTIN) 60 MG 12 hr tablet Take 1 tablet (60 mg total) by mouth every 8 (eight) hours. 90 tablet 0   ondansetron (ZOFRAN) 8 MG tablet Take 1 tablet (8 mg total) by mouth every 8 (eight) hours as needed for nausea or vomiting. 60 tablet 0   Oxycodone HCl 20 MG TABS Take 1 tablet (20 mg total) by mouth every 6 (six) hours as needed. 60 tablet 0  pantoprazole (PROTONIX) 40 MG tablet Take 1 tablet (40 mg total) by mouth 2 (two) times daily. 60 tablet 2   polyethylene glycol powder (GLYCOLAX/MIRALAX) 17 GM/SCOOP powder Take 1 capful (17 g) with water by mouth daily. 238 g 0   prochlorperazine (COMPAZINE) 10 MG tablet Take 1 tablet (10 mg total) by mouth every 6 (six) hours as needed for nausea or vomiting. 60 tablet 0   rosuvastatin (CRESTOR) 10 MG tablet Take 10 mg by mouth daily.     senna (SENOKOT) 8.6 MG TABS tablet Take 1 tablet (8.6 mg total) by mouth in the morning and at bedtime. 120 tablet 3   No current facility-administered medications for this visit.    VITAL SIGNS: There were no vitals taken for this visit. There were no vitals filed for this visit.  Estimated body mass index is  32.36 kg/m as calculated from the following:   Height as of 03/26/22: _0  (1.753 m).   Weight as of an earlier encounter on 05/07/22: 219 lb 1.6 oz (99.4 kg).   PERFORMANCE STATUS (ECOG) : 1 - Symptomatic but completely ambulatory   Physical Exam General: NAD Cardiovascular: regular rate and rhythm Pulmonary: normal breathing pattern  Abdomen: soft, nontender, + bowel sounds Extremities: no edema, no joint deformities, back brace in place Skin: no rashes Neurological: AAO x3  IMPRESSION: I saw [name] during his infusion. No acute distress noted. Is remaining as active as possible. Appetite is good. Denies nausea, vomiting, constipation, or diarrhea.   Neoplasm related pain [name] reports his pain is well-controlled on current regimen. He does continue to have some back discomfort and spasms despite use of regimen. States he has plans for follow-up with his spinal provider.    We discussed his current pain regimen: MS Contin 60 mg every 8 hours, oxycodone 20 mg every 6 hours as needed for breakthrough pain (does not have to take around-the-clock), gabapentin 100 mg 2 times daily, and Robaxin 3 times daily as needed.  He unfortunately ran out of his oxycodone about 3 days ago. He states pain continues to be controlled on regimen with no signs of withdrawal or discomfort in the absence of taking. Education provided that this would be a good time to wean down medications while closely monitoring and assessing his response. He verbalized understanding. He also speaks of some financial barriers as he is currently receiving long-term disability coverage only. Is planning to connect with our financial team to discuss further options.   We will plan to not restart his oxycodone. Will decrease gabapentin to once daily. Continue all other medications as prescribed. He knows to contact our office with any changes.   Nausea/Indigestion He is taking Protonix, Zofran, and Compazine.  Appetite has  improved. [name] reports some indigestion but also relates some of his symptoms to his allergies and post-nasal drip. Advised to increase protonix to twice daily and begin taking Zyrtec or Claritin. He states he has taken both of these in the past with no improvement. Does utilize a netty pot to cleanse his sinuses.   Constipation Constipation has improved with use of daily MiraLAX and senna.  Encouraged him to continue regimen.  PLAN: Protonix 40 mg twice daily MS Contin 60 mg every 8 hours Will discontinue oxycodone as needed for breakthrough pain  Decrease Gabapentin to 100 mg  daily Zofran and Compazine as needed for nausea Robaxin as needed for muscle spasms Patient to connect with our financial resources department to discussed all available resources.  I will plan to see patient back in 3-4 weeks in collaboration with other oncology appointments.  Patient knows to contact office sooner if needed.   Patient expressed understanding and was in agreement with this plan. He also understands that He can call the clinic at any time with any questions, concerns, or complaints.   Any controlled substances utilized were prescribed in the context of palliative care. PDMP has been reviewed.    Time Total: 40 min   Visit consisted of counseling and education dealing with the complex and emotionally intense issues of symptom management and palliative care in the setting of serious and potentially life-threatening illness.Greater than 50%  of this time was spent counseling and coordinating care related to the above assessment and plan.  Alda Lea, AGPCNP-BC  Palliative Medicine Team/[facility] [place]

## 2022-05-07 NOTE — Patient Instructions (Signed)
[facility] ONCOLOGY  Discharge Instructions: Thank you for choosing [facility] to provide your oncology and hematology care.   If you have a lab appointment with the [facility], please go directly to the [facility] and check in at the registration area.   Wear comfortable clothing and clothing appropriate for easy access to any Portacath or PICC line.   We strive to give you quality time with your provider. You may need to reschedule your appointment if you arrive late (15 or more minutes).  Arriving late affects you and other patients whose appointments are after yours.  Also, if you miss three or more appointments without notifying the office, you may be dismissed from the clinic at the provider's discretion.      For prescription refill requests, have your pharmacy contact our office and allow 72 hours for refills to be completed.    Today you received the following chemotherapy and/or immunotherapy agents: Velcade      To help prevent nausea and vomiting after your treatment, we encourage you to take your nausea medication as directed.  BELOW ARE SYMPTOMS THAT SHOULD BE REPORTED IMMEDIATELY: *FEVER GREATER THAN 100.4 F (38 C) OR HIGHER *CHILLS OR SWEATING *NAUSEA AND VOMITING THAT IS NOT CONTROLLED WITH YOUR NAUSEA MEDICATION *UNUSUAL SHORTNESS OF BREATH *UNUSUAL BRUISING OR BLEEDING *URINARY PROBLEMS (pain or burning when urinating, or frequent urination) *BOWEL PROBLEMS (unusual diarrhea, constipation, pain near the anus) TENDERNESS IN MOUTH AND THROAT WITH OR WITHOUT PRESENCE OF ULCERS (sore throat, sores in mouth, or a toothache) UNUSUAL RASH, SWELLING OR PAIN  UNUSUAL VAGINAL DISCHARGE OR ITCHING   Items with * indicate a potential emergency and should be followed up as soon as possible or go to the Emergency Department if any problems should occur.  Please show the CHEMOTHERAPY ALERT CARD or IMMUNOTHERAPY ALERT CARD at check-in to  the Emergency Department and triage nurse.  Should you have questions after your visit or need to cancel or reschedule your appointment, please contact [facility]  Dept: [phone]  and follow the prompts.  Office hours are 8:00 a.m. to 4:30 p.m. Monday - Friday. Please note that voicemails left after 4:00 p.m. may not be returned until the following business day.  We are closed weekends and major holidays. You have access to a nurse at all times for urgent questions. Please call the main number to the clinic Dept: [phone] and follow the prompts.   For any non-urgent questions, you may also contact your provider using MyChart. We now offer e-Visits for anyone 47 and older to request care online for non-urgent symptoms. For details visit [email or web address].   Also download the MyChart app! Go to the app store, search "MyChart", open the app, select [facility], and log in with your MyChart username and password.  Masks are optional in the cancer centers. If you would like for your care team to wear a mask while they are taking care of you, please let them know. You may have one support person who is at least 53 years old accompany you for your appointments.

## 2022-05-08 DIAGNOSIS — K219 Gastro-esophageal reflux disease without esophagitis: Secondary | ICD-10-CM | POA: Diagnosis not present

## 2022-05-08 DIAGNOSIS — I10 Essential (primary) hypertension: Secondary | ICD-10-CM | POA: Diagnosis not present

## 2022-05-08 DIAGNOSIS — S42202D Unspecified fracture of upper end of left humerus, subsequent encounter for fracture with routine healing: Secondary | ICD-10-CM | POA: Diagnosis not present

## 2022-05-08 DIAGNOSIS — F17210 Nicotine dependence, cigarettes, uncomplicated: Secondary | ICD-10-CM | POA: Diagnosis not present

## 2022-05-08 DIAGNOSIS — K5903 Drug induced constipation: Secondary | ICD-10-CM | POA: Diagnosis not present

## 2022-05-08 DIAGNOSIS — M4802 Spinal stenosis, cervical region: Secondary | ICD-10-CM | POA: Diagnosis not present

## 2022-05-08 DIAGNOSIS — D630 Anemia in neoplastic disease: Secondary | ICD-10-CM | POA: Diagnosis not present

## 2022-05-08 DIAGNOSIS — M8458XD Pathological fracture in neoplastic disease, other specified site, subsequent encounter for fracture with routine healing: Secondary | ICD-10-CM | POA: Diagnosis not present

## 2022-05-08 DIAGNOSIS — M109 Gout, unspecified: Secondary | ICD-10-CM | POA: Diagnosis not present

## 2022-05-08 DIAGNOSIS — T402X5D Adverse effect of other opioids, subsequent encounter: Secondary | ICD-10-CM | POA: Diagnosis not present

## 2022-05-08 DIAGNOSIS — N179 Acute kidney failure, unspecified: Secondary | ICD-10-CM | POA: Diagnosis not present

## 2022-05-08 DIAGNOSIS — G35 Multiple sclerosis: Secondary | ICD-10-CM | POA: Diagnosis not present

## 2022-05-08 DIAGNOSIS — E782 Mixed hyperlipidemia: Secondary | ICD-10-CM | POA: Diagnosis not present

## 2022-05-08 DIAGNOSIS — G894 Chronic pain syndrome: Secondary | ICD-10-CM | POA: Diagnosis not present

## 2022-05-08 DIAGNOSIS — G629 Polyneuropathy, unspecified: Secondary | ICD-10-CM | POA: Diagnosis not present

## 2022-05-08 DIAGNOSIS — C9000 Multiple myeloma not having achieved remission: Secondary | ICD-10-CM | POA: Diagnosis not present

## 2022-05-09 ENCOUNTER — Inpatient Hospital Stay: Payer: BC Managed Care – PPO | Admitting: Licensed Clinical Social Worker

## 2022-05-09 DIAGNOSIS — C9000 Multiple myeloma not having achieved remission: Secondary | ICD-10-CM

## 2022-05-09 NOTE — Progress Notes (Signed)
[facility] CSW Progress Note  Clinical Education officer, museum  received referral from medical team to contact pt  regarding financial concerns as he has utilized the [employer] and continues to need assistance.  Pt resides alone w/ a sister residing in [place] who is able to assist, but has a family of her own and works full time.  Pt was employed as a Biochemist, clinical prior to diagnosis and until recently was receiving short term disability.  Recently his benefits switched to long term disability through work which is substantially less than his regular paycheck.  Pt discussed applying for Medicaid and submitting a referral to the [facility] as pt may require long term treatment that will keep him out of work for longer than a year.  Pt states he will set up an appointment to complete a Medicaid application.  CSW to meet w/ pt on 12/19 when he receives his next infusion to have the [facility] referral signed.  At that time CSW will also provide pt w/ a [employer] card and will contact LLS to see if there are any grants available at this time.  CSW to continue to provide support as appropriate throughout duration of treatment.      Henriette Combs, LCSW

## 2022-05-12 ENCOUNTER — Telehealth: Payer: Self-pay | Admitting: *Deleted

## 2022-05-12 ENCOUNTER — Other Ambulatory Visit: Payer: Self-pay | Admitting: *Deleted

## 2022-05-12 ENCOUNTER — Other Ambulatory Visit (HOSPITAL_COMMUNITY): Payer: Self-pay

## 2022-05-12 ENCOUNTER — Other Ambulatory Visit: Payer: Self-pay | Admitting: Nurse Practitioner

## 2022-05-12 ENCOUNTER — Telehealth: Payer: Self-pay | Admitting: Pharmacy Technician

## 2022-05-12 DIAGNOSIS — K219 Gastro-esophageal reflux disease without esophagitis: Secondary | ICD-10-CM | POA: Diagnosis not present

## 2022-05-12 DIAGNOSIS — C9000 Multiple myeloma not having achieved remission: Secondary | ICD-10-CM | POA: Diagnosis not present

## 2022-05-12 DIAGNOSIS — G35 Multiple sclerosis: Secondary | ICD-10-CM | POA: Diagnosis not present

## 2022-05-12 DIAGNOSIS — S42202D Unspecified fracture of upper end of left humerus, subsequent encounter for fracture with routine healing: Secondary | ICD-10-CM | POA: Diagnosis not present

## 2022-05-12 DIAGNOSIS — E782 Mixed hyperlipidemia: Secondary | ICD-10-CM | POA: Diagnosis not present

## 2022-05-12 DIAGNOSIS — G893 Neoplasm related pain (acute) (chronic): Secondary | ICD-10-CM

## 2022-05-12 DIAGNOSIS — M8458XD Pathological fracture in neoplastic disease, other specified site, subsequent encounter for fracture with routine healing: Secondary | ICD-10-CM | POA: Diagnosis not present

## 2022-05-12 DIAGNOSIS — G629 Polyneuropathy, unspecified: Secondary | ICD-10-CM | POA: Diagnosis not present

## 2022-05-12 DIAGNOSIS — Z515 Encounter for palliative care: Secondary | ICD-10-CM

## 2022-05-12 DIAGNOSIS — G894 Chronic pain syndrome: Secondary | ICD-10-CM | POA: Diagnosis not present

## 2022-05-12 DIAGNOSIS — M4802 Spinal stenosis, cervical region: Secondary | ICD-10-CM | POA: Diagnosis not present

## 2022-05-12 DIAGNOSIS — K5903 Drug induced constipation: Secondary | ICD-10-CM | POA: Diagnosis not present

## 2022-05-12 DIAGNOSIS — N179 Acute kidney failure, unspecified: Secondary | ICD-10-CM | POA: Diagnosis not present

## 2022-05-12 DIAGNOSIS — D630 Anemia in neoplastic disease: Secondary | ICD-10-CM | POA: Diagnosis not present

## 2022-05-12 DIAGNOSIS — T402X5D Adverse effect of other opioids, subsequent encounter: Secondary | ICD-10-CM | POA: Diagnosis not present

## 2022-05-12 DIAGNOSIS — I10 Essential (primary) hypertension: Secondary | ICD-10-CM | POA: Diagnosis not present

## 2022-05-12 DIAGNOSIS — M109 Gout, unspecified: Secondary | ICD-10-CM | POA: Diagnosis not present

## 2022-05-12 DIAGNOSIS — F17210 Nicotine dependence, cigarettes, uncomplicated: Secondary | ICD-10-CM | POA: Diagnosis not present

## 2022-05-12 MED ORDER — REVLIMID 25 MG PO CAPS: 25.0000 mg | ORAL_CAPSULE | Freq: Every day | ORAL | 0 refills | Status: DC

## 2022-05-12 NOTE — Telephone Encounter (Signed)
Received call from pt inquiring about his co-pay for his Revlimid. He states his insurance tells him he has a $100 co-pay. He cannot afford that.   Message sent to Leron Croak and Lady Deutscher in the Oral Chemo pharmacy dept.

## 2022-05-12 NOTE — Telephone Encounter (Signed)
Oral Oncology Patient Advocate Encounter  Spoke with [place] at [employer] and confirmed that patient now has a $100 copay under their insurance plan.  The available generic copay cards are not applicable to the patient's rx.  A new rx for brand Revlimid has been sent to [facility] with a DAW 1 so that a copay card can be acquired through [employer].   I have contacted the [employer] team to update them on this and will continue to check the status until a new copay can be confirmed.  I have spoken with the patient. I also provided the patient with the phone number for [employer] Access Support so they can begin the co-pay card enrollment process.  Lady Deutscher, CPhT-Adv Oncology Pharmacy Patient [facility] Direct Number: [phone]  Fax: [phone]

## 2022-05-13 ENCOUNTER — Inpatient Hospital Stay: Payer: BC Managed Care – PPO | Admitting: Licensed Clinical Social Worker

## 2022-05-13 ENCOUNTER — Inpatient Hospital Stay: Payer: BC Managed Care – PPO

## 2022-05-13 ENCOUNTER — Other Ambulatory Visit (HOSPITAL_COMMUNITY): Payer: Self-pay

## 2022-05-13 ENCOUNTER — Other Ambulatory Visit: Payer: Self-pay

## 2022-05-13 VITALS — BP 111/68 | HR 83 | Temp 98.6°F | Resp 17 | Wt 220.5 lb

## 2022-05-13 DIAGNOSIS — F17210 Nicotine dependence, cigarettes, uncomplicated: Secondary | ICD-10-CM | POA: Diagnosis not present

## 2022-05-13 DIAGNOSIS — Z86010 Personal history of colonic polyps: Secondary | ICD-10-CM | POA: Diagnosis not present

## 2022-05-13 DIAGNOSIS — Z79624 Long term (current) use of inhibitors of nucleotide synthesis: Secondary | ICD-10-CM | POA: Diagnosis not present

## 2022-05-13 DIAGNOSIS — C9000 Multiple myeloma not having achieved remission: Secondary | ICD-10-CM

## 2022-05-13 DIAGNOSIS — Z7982 Long term (current) use of aspirin: Secondary | ICD-10-CM | POA: Diagnosis not present

## 2022-05-13 DIAGNOSIS — E785 Hyperlipidemia, unspecified: Secondary | ICD-10-CM | POA: Diagnosis not present

## 2022-05-13 DIAGNOSIS — Z7961 Long term (current) use of immunomodulator: Secondary | ICD-10-CM | POA: Diagnosis not present

## 2022-05-13 DIAGNOSIS — K219 Gastro-esophageal reflux disease without esophagitis: Secondary | ICD-10-CM | POA: Diagnosis not present

## 2022-05-13 DIAGNOSIS — G893 Neoplasm related pain (acute) (chronic): Secondary | ICD-10-CM | POA: Diagnosis not present

## 2022-05-13 DIAGNOSIS — I10 Essential (primary) hypertension: Secondary | ICD-10-CM | POA: Diagnosis not present

## 2022-05-13 DIAGNOSIS — K5900 Constipation, unspecified: Secondary | ICD-10-CM | POA: Diagnosis not present

## 2022-05-13 DIAGNOSIS — Z8042 Family history of malignant neoplasm of prostate: Secondary | ICD-10-CM | POA: Diagnosis not present

## 2022-05-13 DIAGNOSIS — R112 Nausea with vomiting, unspecified: Secondary | ICD-10-CM | POA: Diagnosis not present

## 2022-05-13 DIAGNOSIS — Z79899 Other long term (current) drug therapy: Secondary | ICD-10-CM | POA: Diagnosis not present

## 2022-05-13 DIAGNOSIS — Z5112 Encounter for antineoplastic immunotherapy: Secondary | ICD-10-CM | POA: Diagnosis not present

## 2022-05-13 LAB — CBC WITH DIFFERENTIAL (CANCER CENTER ONLY)
Abs Immature Granulocytes: 0.01 10*3/uL (ref 0.00–0.07)
Basophils Absolute: 0 10*3/uL (ref 0.0–0.1)
Basophils Relative: 0 %
Eosinophils Absolute: 0 10*3/uL (ref 0.0–0.5)
Eosinophils Relative: 0 %
HCT: 30.4 % — ABNORMAL LOW (ref 39.0–52.0)
Hemoglobin: 10 g/dL — ABNORMAL LOW (ref 13.0–17.0)
Immature Granulocytes: 0 %
Lymphocytes Relative: 39 %
Lymphs Abs: 2.1 10*3/uL (ref 0.7–4.0)
MCH: 28.8 pg (ref 26.0–34.0)
MCHC: 32.9 g/dL (ref 30.0–36.0)
MCV: 87.6 fL (ref 80.0–100.0)
Monocytes Absolute: 0.5 10*3/uL (ref 0.1–1.0)
Monocytes Relative: 9 %
Neutro Abs: 2.7 10*3/uL (ref 1.7–7.7)
Neutrophils Relative %: 52 %
Platelet Count: 194 10*3/uL (ref 150–400)
RBC: 3.47 MIL/uL — ABNORMAL LOW (ref 4.22–5.81)
RDW: 14.1 % (ref 11.5–15.5)
WBC Count: 5.2 10*3/uL (ref 4.0–10.5)
nRBC: 0 % (ref 0.0–0.2)

## 2022-05-13 LAB — CMP (CANCER CENTER ONLY)
ALT: 18 U/L (ref 0–44)
AST: 15 U/L (ref 15–41)
Albumin: 3.4 g/dL — ABNORMAL LOW (ref 3.5–5.0)
Alkaline Phosphatase: 93 U/L (ref 38–126)
Anion gap: 9 (ref 5–15)
BUN: 12 mg/dL (ref 6–20)
CO2: 23 mmol/L (ref 22–32)
Calcium: 8.3 mg/dL — ABNORMAL LOW (ref 8.9–10.3)
Chloride: 104 mmol/L (ref 98–111)
Creatinine: 0.76 mg/dL (ref 0.61–1.24)
GFR, Estimated: >60 mL/min (ref >60)
Glucose, Bld: 135 mg/dL — ABNORMAL HIGH (ref 70–99)
Potassium: 3.4 mmol/L — ABNORMAL LOW (ref 3.5–5.1)
Sodium: 136 mmol/L (ref 135–145)
Total Bilirubin: 0.4 mg/dL (ref 0.3–1.2)
Total Protein: 7.2 g/dL (ref 6.5–8.1)

## 2022-05-13 MED ORDER — MORPHINE SULFATE ER 60 MG PO TBCR
60.0000 mg | EXTENDED_RELEASE_TABLET | Freq: Three times a day (TID) | ORAL | 0 refills | Status: DC
  Filled 2022-05-13: qty 6, 2d supply, fill #0
  Filled 2022-05-14: qty 84, 28d supply, fill #0

## 2022-05-13 MED ORDER — BORTEZOMIB CHEMO SQ INJECTION 3.5 MG (2.5MG/ML)
1.3000 mg/m2 | Freq: Once | INTRAMUSCULAR | Status: AC
  Administered 2022-05-13: 3 mg via SUBCUTANEOUS
  Filled 2022-05-13: qty 1.2

## 2022-05-13 MED ORDER — DEXAMETHASONE 4 MG PO TABS
40.0000 mg | ORAL_TABLET | Freq: Once | ORAL | Status: AC
  Administered 2022-05-13: 40 mg via ORAL
  Filled 2022-05-13: qty 10

## 2022-05-13 NOTE — Patient Instructions (Signed)
[facility] ONCOLOGY  Discharge Instructions: Thank you for choosing [facility] to provide your oncology and hematology care.   If you have a lab appointment with the [facility], please go directly to the [facility] and check in at the registration area.   Wear comfortable clothing and clothing appropriate for easy access to any Portacath or PICC line.   We strive to give you quality time with your provider. You may need to reschedule your appointment if you arrive late (15 or more minutes).  Arriving late affects you and other patients whose appointments are after yours.  Also, if you miss three or more appointments without notifying the office, you may be dismissed from the clinic at the provider's discretion.      For prescription refill requests, have your pharmacy contact our office and allow 72 hours for refills to be completed.    Today you received the following chemotherapy and/or immunotherapy agents: Velcade      To help prevent nausea and vomiting after your treatment, we encourage you to take your nausea medication as directed.  BELOW ARE SYMPTOMS THAT SHOULD BE REPORTED IMMEDIATELY: *FEVER GREATER THAN 100.4 F (38 C) OR HIGHER *CHILLS OR SWEATING *NAUSEA AND VOMITING THAT IS NOT CONTROLLED WITH YOUR NAUSEA MEDICATION *UNUSUAL SHORTNESS OF BREATH *UNUSUAL BRUISING OR BLEEDING *URINARY PROBLEMS (pain or burning when urinating, or frequent urination) *BOWEL PROBLEMS (unusual diarrhea, constipation, pain near the anus) TENDERNESS IN MOUTH AND THROAT WITH OR WITHOUT PRESENCE OF ULCERS (sore throat, sores in mouth, or a toothache) UNUSUAL RASH, SWELLING OR PAIN  UNUSUAL VAGINAL DISCHARGE OR ITCHING   Items with * indicate a potential emergency and should be followed up as soon as possible or go to the Emergency Department if any problems should occur.  Please show the CHEMOTHERAPY ALERT CARD or IMMUNOTHERAPY ALERT CARD at check-in to  the Emergency Department and triage nurse.  Should you have questions after your visit or need to cancel or reschedule your appointment, please contact [facility]  Dept: [phone]  and follow the prompts.  Office hours are 8:00 a.m. to 4:30 p.m. Monday - Friday. Please note that voicemails left after 4:00 p.m. may not be returned until the following business day.  We are closed weekends and major holidays. You have access to a nurse at all times for urgent questions. Please call the main number to the clinic Dept: [phone] and follow the prompts.   For any non-urgent questions, you may also contact your provider using MyChart. We now offer e-Visits for anyone 69 and older to request care online for non-urgent symptoms. For details visit [email or web address].   Also download the MyChart app! Go to the app store, search "MyChart", open the app, select [facility], and log in with your MyChart username and password.  Masks are optional in the cancer centers. If you would like for your care team to wear a mask while they are taking care of you, please let them know. You may have one support [name] who is at least 53 years old accompany you for your appointments.

## 2022-05-13 NOTE — Telephone Encounter (Signed)
Oral Oncology Patient Advocate Encounter  Received communication from [facility] at [facility]. Patient was successfully enrolled in [facility] Patient Support to cover his remaining out of pocket costs.  Delivery has been scheduled with the patient for 05/14/2022.  Lady Deutscher, CPhT-Adv Oncology Pharmacy Patient [facility] Direct Number: [phone]  Fax: [phone]

## 2022-05-13 NOTE — Progress Notes (Signed)
Per Dr. Lorenso Courier, okay to proceed with previous CMP results as the machine is down in lab.

## 2022-05-13 NOTE — Progress Notes (Signed)
[facility] CSW Progress Note  Holiday representative met with patient in infusion to provide w/ a [employer] card.  [facility] referral also signed, completed and submitted on behalf of pt.  CSW to remain available to provide support as appropriate throughout duration of treatment.     Henriette Combs, LCSW

## 2022-05-14 ENCOUNTER — Other Ambulatory Visit (HOSPITAL_COMMUNITY): Payer: Self-pay

## 2022-05-14 LAB — KAPPA/LAMBDA LIGHT CHAINS
Kappa free light chain: 39.3 mg/L — ABNORMAL HIGH (ref 3.3–19.4)
Kappa, lambda light chain ratio: 2.93 — ABNORMAL HIGH (ref 0.26–1.65)
Lambda free light chains: 13.4 mg/L (ref 5.7–26.3)

## 2022-05-15 ENCOUNTER — Other Ambulatory Visit: Payer: Self-pay | Admitting: Orthopaedic Surgery

## 2022-05-15 DIAGNOSIS — S32030B Wedge compression fracture of third lumbar vertebra, initial encounter for open fracture: Secondary | ICD-10-CM

## 2022-05-15 DIAGNOSIS — M47896 Other spondylosis, lumbar region: Secondary | ICD-10-CM | POA: Diagnosis not present

## 2022-05-15 DIAGNOSIS — M545 Low back pain, unspecified: Secondary | ICD-10-CM | POA: Diagnosis not present

## 2022-05-15 DIAGNOSIS — S32030A Wedge compression fracture of third lumbar vertebra, initial encounter for closed fracture: Secondary | ICD-10-CM | POA: Diagnosis not present

## 2022-05-16 ENCOUNTER — Encounter: Payer: Self-pay | Admitting: Physician Assistant

## 2022-05-16 ENCOUNTER — Telehealth: Payer: Self-pay | Admitting: *Deleted

## 2022-05-16 NOTE — Telephone Encounter (Signed)
Received call from pt. He states he has noticed some redness around his last injection site (Velcade) He states it is just at the site-no rashes or redness any where else. Advised he could put some ice in it or hydrocortisone cream if it is itchy. He states it is not itchy. Advised that it will gradually ease up. Though advised to call if it worsens. Pt voiced understanding.

## 2022-05-19 ENCOUNTER — Encounter: Payer: Self-pay | Admitting: Hematology and Oncology

## 2022-05-20 ENCOUNTER — Emergency Department (HOSPITAL_COMMUNITY)
Admission: EM | Admit: 2022-05-20 | Discharge: 2022-05-20 | Disposition: A | Discharge status: Home / Self Care | Payer: BC Managed Care – PPO | Attending: Emergency Medicine | Admitting: Emergency Medicine

## 2022-05-20 ENCOUNTER — Other Ambulatory Visit (HOSPITAL_COMMUNITY): Payer: Self-pay

## 2022-05-20 ENCOUNTER — Emergency Department (HOSPITAL_COMMUNITY): Payer: BC Managed Care – PPO

## 2022-05-20 ENCOUNTER — Encounter (HOSPITAL_COMMUNITY): Payer: Self-pay

## 2022-05-20 DIAGNOSIS — I10 Essential (primary) hypertension: Secondary | ICD-10-CM | POA: Diagnosis not present

## 2022-05-20 DIAGNOSIS — M25552 Pain in left hip: Secondary | ICD-10-CM | POA: Diagnosis not present

## 2022-05-20 DIAGNOSIS — C9000 Multiple myeloma not having achieved remission: Secondary | ICD-10-CM

## 2022-05-20 DIAGNOSIS — M25551 Pain in right hip: Secondary | ICD-10-CM | POA: Diagnosis not present

## 2022-05-20 DIAGNOSIS — K5900 Constipation, unspecified: Secondary | ICD-10-CM | POA: Diagnosis not present

## 2022-05-20 DIAGNOSIS — R109 Unspecified abdominal pain: Secondary | ICD-10-CM | POA: Diagnosis not present

## 2022-05-20 LAB — URINALYSIS, ROUTINE W REFLEX MICROSCOPIC
Bilirubin Urine: NEGATIVE
Glucose, UA: NEGATIVE mg/dL
Hgb urine dipstick: NEGATIVE
Ketones, ur: NEGATIVE mg/dL
Leukocytes,Ua: NEGATIVE
Nitrite: NEGATIVE
Protein, ur: NEGATIVE mg/dL
Specific Gravity, Urine: 1.02 (ref 1.005–1.030)
pH: 7 (ref 5.0–8.0)

## 2022-05-20 LAB — COMPREHENSIVE METABOLIC PANEL
ALT: 13 U/L (ref 0–44)
AST: 13 U/L — ABNORMAL LOW (ref 15–41)
Albumin: 3.7 g/dL (ref 3.5–5.0)
Alkaline Phosphatase: 101 U/L (ref 38–126)
Anion gap: 9 (ref 5–15)
BUN: 10 mg/dL (ref 6–20)
CO2: 22 mmol/L (ref 22–32)
Calcium: 9 mg/dL (ref 8.9–10.3)
Chloride: 103 mmol/L (ref 98–111)
Creatinine, Ser: 0.83 mg/dL (ref 0.61–1.24)
GFR, Estimated: >60 mL/min (ref >60)
Glucose, Bld: 104 mg/dL — ABNORMAL HIGH (ref 70–99)
Potassium: 3.9 mmol/L (ref 3.5–5.1)
Sodium: 134 mmol/L — ABNORMAL LOW (ref 135–145)
Total Bilirubin: 0.5 mg/dL (ref 0.3–1.2)
Total Protein: 8 g/dL (ref 6.5–8.1)

## 2022-05-20 LAB — MULTIPLE MYELOMA PANEL, SERUM
Albumin SerPl Elph-Mcnc: 3.2 g/dL (ref 2.9–4.4)
Albumin/Glob SerPl: 1 (ref 0.7–1.7)
Alpha 1: 0.3 g/dL (ref 0.0–0.4)
Alpha2 Glob SerPl Elph-Mcnc: 0.9 g/dL (ref 0.4–1.0)
B-Globulin SerPl Elph-Mcnc: 0.9 g/dL (ref 0.7–1.3)
Gamma Glob SerPl Elph-Mcnc: 1.3 g/dL (ref 0.4–1.8)
Globulin, Total: 3.4 g/dL (ref 2.2–3.9)
IgA: 52 mg/dL — ABNORMAL LOW (ref 90–386)
IgG (Immunoglobin G), Serum: 1596 mg/dL (ref 603–1613)
IgM (Immunoglobulin M), Srm: 69 mg/dL (ref 20–172)
M Protein SerPl Elph-Mcnc: 0.8 g/dL — ABNORMAL HIGH
Total Protein ELP: 6.6 g/dL (ref 6.0–8.5)

## 2022-05-20 LAB — CBC WITH DIFFERENTIAL/PLATELET
Abs Immature Granulocytes: 0.05 10*3/uL (ref 0.00–0.07)
Basophils Absolute: 0 10*3/uL (ref 0.0–0.1)
Basophils Relative: 0 %
Eosinophils Absolute: 0 10*3/uL (ref 0.0–0.5)
Eosinophils Relative: 0 %
HCT: 32.5 % — ABNORMAL LOW (ref 39.0–52.0)
Hemoglobin: 10.4 g/dL — ABNORMAL LOW (ref 13.0–17.0)
Immature Granulocytes: 1 %
Lymphocytes Relative: 34 %
Lymphs Abs: 1.7 10*3/uL (ref 0.7–4.0)
MCH: 28.3 pg (ref 26.0–34.0)
MCHC: 32 g/dL (ref 30.0–36.0)
MCV: 88.6 fL (ref 80.0–100.0)
Monocytes Absolute: 0.5 10*3/uL (ref 0.1–1.0)
Monocytes Relative: 10 %
Neutro Abs: 2.8 10*3/uL (ref 1.7–7.7)
Neutrophils Relative %: 55 %
Platelets: 232 10*3/uL (ref 150–400)
RBC: 3.67 MIL/uL — ABNORMAL LOW (ref 4.22–5.81)
RDW: 14.6 % (ref 11.5–15.5)
WBC: 4.9 10*3/uL (ref 4.0–10.5)
nRBC: 0 % (ref 0.0–0.2)

## 2022-05-20 LAB — LIPASE, BLOOD: Lipase: 49 U/L (ref 11–51)

## 2022-05-20 MED ORDER — LORAZEPAM 0.5 MG PO TABS: 0.5000 mg | ORAL_TABLET | Freq: Three times a day (TID) | ORAL | 0 refills | Status: AC | PRN

## 2022-05-20 MED ORDER — ONDANSETRON HCL 4 MG/2ML IJ SOLN
4.0000 mg | Freq: Once | INTRAMUSCULAR | Status: AC
  Administered 2022-05-20: 4 mg via INTRAVENOUS
  Filled 2022-05-20: qty 2

## 2022-05-20 MED ORDER — HYDROMORPHONE HCL 1 MG/ML IJ SOLN
1.0000 mg | Freq: Once | INTRAMUSCULAR | Status: AC
  Administered 2022-05-20: 1 mg via INTRAVENOUS
  Filled 2022-05-20: qty 1

## 2022-05-20 MED ORDER — LORAZEPAM 1 MG PO TABS
1.0000 mg | ORAL_TABLET | Freq: Once | ORAL | Status: AC
  Administered 2022-05-20: 1 mg via ORAL
  Filled 2022-05-20: qty 1

## 2022-05-20 NOTE — ED Provider Triage Note (Signed)
Emergency Medicine Provider Triage Evaluation Note  [name] , a 51 y.o. male  was evaluated in triage.  Pt complains of nausea, chills, diarrhea, and restlessness starting around 2AM.  Dx of multiple myeloma about 3-4 months ago.  Received treatment last week Tuesday, next due tomorrow (Wednesday).  Has had 5-6 treatments without any reactions so far.  Actively dry heaving into trash can upon history intake.  Has taken his nausea medicine this morning, unsure of name.  Unsure of fevers.  Denies urinary symptoms, chest pain, shortness of breath.  Review of Systems  Positive:  Negative: See above  Physical Exam  BP 129/84 (BP Location: Left Arm)   Pulse 94   Temp 98.8 F (37.1 C) (Oral)   Resp 18   SpO2 100%  Gen:   Awake, no distress, actively dry heaving into waste basket.  Appears uncomfortable. Resp:  Normal effort  MSK:   Moves extremities without difficulty  Other:  Abdomen diffusely tender.  No CVA tenderness.  Mild abdominal distension.  No guarding.  Medical Decision Making  Medically screening exam initiated at 7:28 AM.  Appropriate orders placed.  CHRISEAN KLOTH was informed that the remainder of the evaluation will be completed by another provider, this initial triage assessment does not replace that evaluation, and the importance of remaining in the ED until their evaluation is complete.  Discussed with triage nurse and tech, pt to be brought back to next available room.   Prince Rome, PA-C 34/03/52 [phone]

## 2022-05-20 NOTE — ED Triage Notes (Signed)
Pt arrived via POV, c/o bilateral leg pain/restlessness and abd pain and nausea since last night before he went to bed.

## 2022-05-20 NOTE — ED Provider Notes (Signed)
[facility] DEPT Provider Note   CSN: [id number] Arrival date & time: 05/20/22  0710     History  Chief Complaint  Patient presents with   Abdominal Pain   Leg Pain    [name] is a 51 y.o. male.   Abdominal Pain Leg Pain    Patient has a history of hypertension hiatal hernia, reflux, multiple myeloma who presents to the ED with complaints of restlessness abdominal and leg discomfort.  Patient states he is having pain in his legs.  He is also having abdominal discomfort and constipation.  He said some nausea as well.  Patient denies any fevers.  He denies any recent falls or injury.  No swelling in his legs.  Patient states he was up all night mostly because he could not get comfortable.  He is not having severe pain but felt very restless.  Patient is not having any trouble walking. Home Medications Prior to Admission medications   Medication Sig Start Date End Date Taking? Authorizing Provider  LORazepam (ATIVAN) 0.5 MG tablet Take 1 tablet (0.5 mg total) by mouth 3 (three) times daily as needed for up to 7 days for anxiety or sleep. 05/20/22 05/27/22 Yes Dorie Rank, MD  REVLIMID 25 MG capsule Take 1 capsule (25 mg total) by mouth daily. [name] # [id number]     Date Obtained 05/12/22 Take one capsule daily x 14 days. None for following 7 days 05/12/22   Orson Slick, MD  acetaminophen (TYLENOL) 500 MG tablet Take 1 tablet (500 mg total) by mouth every 8 (eight) hours as needed for mild pain or moderate pain. 02/24/22   Thurnell Lose, MD  acyclovir (ZOVIRAX) 400 MG tablet Take 1 tablet (400 mg total) by mouth 2 (two) times daily. 02/26/22   Lincoln Brigham, PA-C  allopurinol (ZYLOPRIM) 300 MG tablet Take 1 tablet (300 mg total) by mouth daily. 02/26/22   Lincoln Brigham, PA-C  amLODipine (NORVASC) 5 MG tablet Take 1 tablet (5 mg total) by mouth daily. 03/18/22   Shalhoub, Sherryll Burger, MD  aspirin EC 81 MG tablet Take 81 mg by mouth daily.  Swallow whole.    [provider]  clonazePAM (KLONOPIN) 0.5 MG tablet Take 1 tablet (0.5 mg total) by mouth 2 (two) times daily as needed for anxiety. 04/22/22   [name], MD  docusate sodium (COLACE) 100 MG capsule Take 1 capsule (100 mg total) by mouth 2 (two) times daily as needed for mild constipation. 04/28/22   Pickenpack-Cousar, Carlena Sax, NP  DULoxetine (CYMBALTA) 20 MG capsule Take 1 capsule (20 mg total) by mouth daily. 04/22/22   [name], MD  gabapentin (NEURONTIN) 100 MG capsule Take 1 capsule (100 mg total) by mouth 2 (two) times daily. 04/02/22   Pickenpack-Cousar, Carlena Sax, NP  lenalidomide (REVLIMID) 25 MG capsule Take 1 capsule (25 mg total) by mouth daily. Take for 14 days then none for 7 days. Repeat every 21 days. [employer] Auth # [id number] Date Obtained  05/07/22 05/07/22   Orson Slick, MD  Methocarbamol 1000 MG TABS Take 1,000 mg by mouth every 6 (six) hours as needed. 04/02/22   Pickenpack-Cousar, Carlena Sax, NP  morphine (MS CONTIN) 60 MG 12 hr tablet Take 1 tablet (60 mg total) by mouth every 8 (eight) hours. 05/13/22   Pickenpack-Cousar, Carlena Sax, NP  ondansetron (ZOFRAN) 8 MG tablet Take 1 tablet (8 mg total) by mouth every 8 (eight) hours as  needed for nausea or vomiting. 02/26/22   Lincoln Brigham, PA-C  Oxycodone HCl 20 MG TABS Take 1 tablet (20 mg total) by mouth every 6 (six) hours as needed. 04/03/22   Pickenpack-Cousar, Carlena Sax, NP  pantoprazole (PROTONIX) 40 MG tablet Take 1 tablet (40 mg total) by mouth 2 (two) times daily. 04/02/22   Pickenpack-Cousar, Carlena Sax, NP  polyethylene glycol powder (GLYCOLAX/MIRALAX) 17 GM/SCOOP powder Take 1 capful (17 g) with water by mouth daily. 02/24/22   Thurnell Lose, MD  prochlorperazine (COMPAZINE) 10 MG tablet Take 1 tablet (10 mg total) by mouth every 6 (six) hours as needed for nausea or vomiting. 02/26/22   Dede Query T, PA-C  rosuvastatin (CRESTOR) 10 MG tablet Take 10 mg by mouth daily. 03/03/22    [provider]  senna (SENOKOT) 8.6 MG TABS tablet Take 1 tablet (8.6 mg total) by mouth in the morning and at bedtime. 04/28/22   Pickenpack-Cousar, Carlena Sax, NP      Allergies    Patient has no known allergies.    Review of Systems   Review of Systems  Gastrointestinal:  Positive for abdominal pain.    Physical Exam Updated Vital Signs BP (!) 166/82   Pulse 77   Temp 98.6 F (37 C) (Oral)   Resp 16   SpO2 100%  Physical Exam Vitals and nursing note reviewed.  Constitutional:      Appearance: He is well-developed. He is not diaphoretic.  HENT:     Head: Normocephalic and atraumatic.     Right Ear: External ear normal.     Left Ear: External ear normal.  Eyes:     General: No scleral icterus.       Right eye: No discharge.        Left eye: No discharge.     Conjunctiva/sclera: Conjunctivae normal.  Neck:     Trachea: No tracheal deviation.  Cardiovascular:     Rate and Rhythm: Normal rate and regular rhythm.  Pulmonary:     Effort: Pulmonary effort is normal. No respiratory distress.     Breath sounds: Normal breath sounds. No stridor. No wheezing or rales.  Abdominal:     General: Bowel sounds are normal. There is no distension.     Palpations: Abdomen is soft.     Tenderness: There is generalized abdominal tenderness. There is no guarding or rebound.  Genitourinary:    Testes:        Right: Tenderness present. Swelling not present.        Left: Tenderness present. Swelling not present.     Comments: Mild tenderness palpation upper thighs, no swelling no edema Musculoskeletal:        General: No tenderness or deformity.     Cervical back: Neck supple.  Skin:    General: Skin is warm and dry.     Findings: No rash.  Neurological:     General: No focal deficit present.     Mental Status: He is alert.     Cranial Nerves: No cranial nerve deficit, dysarthria or facial asymmetry.     Sensory: No sensory deficit.     Motor: No abnormal muscle tone or  seizure activity.     Coordination: Coordination normal.  Psychiatric:        Mood and Affect: Mood normal.     ED Results / Procedures / Treatments   Labs (all labs ordered are listed, but only abnormal results are displayed) Labs Reviewed  COMPREHENSIVE  METABOLIC PANEL - Abnormal; Notable for the following components:      Result Value   Sodium 134 (*)    Glucose, Bld 104 (*)    AST 13 (*)    All other components within normal limits  CBC WITH DIFFERENTIAL/PLATELET - Abnormal; Notable for the following components:   RBC 3.67 (*)    Hemoglobin 10.4 (*)    HCT 32.5 (*)    All other components within normal limits  URINALYSIS, ROUTINE W REFLEX MICROSCOPIC - Abnormal; Notable for the following components:   Bacteria, UA RARE (*)    All other components within normal limits  LIPASE, BLOOD    EKG EKG Interpretation  Date/Time:  Tuesday May 20 2022 07:51:14 EST Ventricular Rate:  97 PR Interval:  140 QRS Duration: 95 QT Interval:  345 QTC Calculation: 439 R Axis:   31 Text Interpretation: Sinus rhythm Left ventricular hypertrophy Baseline wander in lead(s) II III aVF No significant change since last tracing Confirmed by Dorie Rank [phone]) on 05/20/2022 7:53:46 AM  Radiology DG Hips Bilat W or Wo Pelvis 3-4 Views  Result Date: 05/20/2022 CLINICAL DATA:  History of multiple myeloma. Hip pain and constipation. EXAM: DG HIP (WITH OR WITHOUT PELVIS) 3-4V BILAT COMPARISON:  01/22/2022 FINDINGS: Question slight progression of widespread subtle lucencies which could be seen with marked progression of myeloma. No dominant large lytic lesion. No evidence of left hip or proximal femur fracture. Previous vertebral augmentations in the lumbar spine. Question periosteal elevation of the right proximal femoral diaphysis but without evidence of fracture or lytic lesion. IMPRESSION: 1. Question some progression of widespread subtle lucencies which could be seen with notable progression of  myeloma. No dominant large lytic lesion. No acute left hip finding specifically. 2. Question periosteal elevation of the right proximal femoral diaphysis but without evidence of fracture or lytic lesion. Electronically Signed   By: Nelson Chimes M.D.   On: 05/20/2022 09:47   DG Abdomen Acute W/Chest  Result Date: 05/20/2022 CLINICAL DATA:  Abdominal pain, constipation EXAM: DG ABDOMEN ACUTE WITH 1 VIEW CHEST COMPARISON:  02/20/2022 FINDINGS: No focal consolidation. No pleural effusion or pneumothorax. Heart and mediastinal contours are unremarkable. No bowel dilatation to suggest obstruction. No air-fluid levels. No pneumoperitoneum, portal venous gas, or pneumatosis. No urolithiasis. No acute osseous abnormality. Prior vertebral body augmentation at L1, L2 on L3. Patchy area sclerosis throughout the axial skeleton as can be seen with marrow infiltrative process such as multiple myeloma. IMPRESSION: Negative abdominal radiographs.  No acute cardiopulmonary disease. Patchy area sclerosis throughout the axial skeleton as can be seen with marrow infiltrative process such as multiple myeloma. Electronically Signed   By: Kathreen Devoid M.D.   On: 05/20/2022 09:46    Procedures Procedures    Medications Ordered in ED Medications  HYDROmorphone (DILAUDID) injection 1 mg (1 mg Intravenous Given 05/20/22 0905)  LORazepam (ATIVAN) tablet 1 mg (1 mg Oral Given 05/20/22 0905)  ondansetron (ZOFRAN) injection 4 mg (4 mg Intravenous Given 05/20/22 7209)    ED Course/ Medical Decision Making/ A&P Clinical Course as of 05/20/22 1020  Tue May 20, 2022  1006 Urinalysis, Routine w reflex microscopic Urine, Clean Catch(!) [JK]  1006 Urinalysis normal [JK]  1006 Lipase, blood Lipase normal [JK]  1006 Comprehensive metabolic panel(!) Metabolic panel normal [JK]  1006 CBC with Differential(!) Anemia stable [JK]  1007 X-rays without signs of acute fracture.  Widespread subtle lucencies consistent with his known  myeloma [JK]    Clinical Course  User Index [JK] Dorie Rank, MD                           Medical Decision Making Problems Addressed: Multiple myeloma, remission status unspecified [facility]): chronic illness or injury with exacerbation, progression, or side effects of treatment  Amount and/or Complexity of Data Reviewed Labs: ordered. Decision-making details documented in ED Course. Radiology: ordered and independent interpretation performed.  Risk Prescription drug management.   Patient presented to the ED for evaluation of some abdominal discomfort as well as discomfort in his legs and restlessness.  Known history of multiple myeloma.  Exam reassuring.  No signs of vascular compromise.  No signs of infection.  Patient had some mild abdominal discomfort but has had some these issues before.  Labs do not show signs of hepatitis pancreatitis.  Doubt acute infection or obstruction.  X-rays do show findings consistent with his known multiple myeloma but no acute fracture or obstruction.  Improved with a dose of his pain medications as well as benzodiazepine.  Suspect symptoms most likely related to his chronic pain as well as a component of anxiety restlessness.  Evaluation and diagnostic testing in the emergency department does not suggest an emergent condition requiring admission or immediate intervention beyond what has been performed at this time.  The patient is safe for discharge and has been instructed to return immediately for worsening symptoms, change in symptoms or any other concerns.        Final Clinical Impression(s) / ED Diagnoses Final diagnoses:  Multiple myeloma, remission status unspecified ([place])    Rx / DC Orders ED Discharge Orders          Ordered    LORazepam (ATIVAN) 0.5 MG tablet  3 times daily PRN        05/20/22 1018              Dorie Rank, MD 05/20/22 1020

## 2022-05-20 NOTE — Discharge Instructions (Signed)
Take the medications as needed to help with insomnia and restlessness at night.  Follow-up with your oncology and palliative doctors as planned.

## 2022-05-21 ENCOUNTER — Inpatient Hospital Stay: Payer: BC Managed Care – PPO | Admitting: Dietician

## 2022-05-21 ENCOUNTER — Inpatient Hospital Stay: Payer: BC Managed Care – PPO

## 2022-05-21 ENCOUNTER — Other Ambulatory Visit: Payer: Self-pay

## 2022-05-21 ENCOUNTER — Inpatient Hospital Stay (HOSPITAL_BASED_OUTPATIENT_CLINIC_OR_DEPARTMENT_OTHER): Payer: BC Managed Care – PPO | Admitting: Hematology and Oncology

## 2022-05-21 DIAGNOSIS — C9000 Multiple myeloma not having achieved remission: Secondary | ICD-10-CM

## 2022-05-21 DIAGNOSIS — G893 Neoplasm related pain (acute) (chronic): Secondary | ICD-10-CM | POA: Diagnosis not present

## 2022-05-21 DIAGNOSIS — Z79624 Long term (current) use of inhibitors of nucleotide synthesis: Secondary | ICD-10-CM | POA: Diagnosis not present

## 2022-05-21 DIAGNOSIS — Z5112 Encounter for antineoplastic immunotherapy: Secondary | ICD-10-CM | POA: Diagnosis not present

## 2022-05-21 DIAGNOSIS — E785 Hyperlipidemia, unspecified: Secondary | ICD-10-CM | POA: Diagnosis not present

## 2022-05-21 DIAGNOSIS — Z79899 Other long term (current) drug therapy: Secondary | ICD-10-CM | POA: Diagnosis not present

## 2022-05-21 DIAGNOSIS — R112 Nausea with vomiting, unspecified: Secondary | ICD-10-CM | POA: Diagnosis not present

## 2022-05-21 DIAGNOSIS — K219 Gastro-esophageal reflux disease without esophagitis: Secondary | ICD-10-CM | POA: Diagnosis not present

## 2022-05-21 DIAGNOSIS — K5900 Constipation, unspecified: Secondary | ICD-10-CM | POA: Diagnosis not present

## 2022-05-21 DIAGNOSIS — F17210 Nicotine dependence, cigarettes, uncomplicated: Secondary | ICD-10-CM | POA: Diagnosis not present

## 2022-05-21 DIAGNOSIS — I10 Essential (primary) hypertension: Secondary | ICD-10-CM | POA: Diagnosis not present

## 2022-05-21 DIAGNOSIS — Z7982 Long term (current) use of aspirin: Secondary | ICD-10-CM | POA: Diagnosis not present

## 2022-05-21 DIAGNOSIS — Z7961 Long term (current) use of immunomodulator: Secondary | ICD-10-CM | POA: Diagnosis not present

## 2022-05-21 DIAGNOSIS — Z86010 Personal history of colonic polyps: Secondary | ICD-10-CM | POA: Diagnosis not present

## 2022-05-21 DIAGNOSIS — Z8042 Family history of malignant neoplasm of prostate: Secondary | ICD-10-CM | POA: Diagnosis not present

## 2022-05-21 LAB — CMP (CANCER CENTER ONLY)
ALT: 10 U/L (ref 0–44)
AST: 11 U/L — ABNORMAL LOW (ref 15–41)
Albumin: 3.7 g/dL (ref 3.5–5.0)
Alkaline Phosphatase: 97 U/L (ref 38–126)
Anion gap: 9 (ref 5–15)
BUN: 8 mg/dL (ref 6–20)
CO2: 24 mmol/L (ref 22–32)
Calcium: 8.6 mg/dL — ABNORMAL LOW (ref 8.9–10.3)
Chloride: 101 mmol/L (ref 98–111)
Creatinine: 0.9 mg/dL (ref 0.61–1.24)
GFR, Estimated: >60 mL/min (ref >60)
Glucose, Bld: 143 mg/dL — ABNORMAL HIGH (ref 70–99)
Potassium: 4 mmol/L (ref 3.5–5.1)
Sodium: 134 mmol/L — ABNORMAL LOW (ref 135–145)
Total Bilirubin: 0.4 mg/dL (ref 0.3–1.2)
Total Protein: 7.3 g/dL (ref 6.5–8.1)

## 2022-05-21 LAB — CBC WITH DIFFERENTIAL (CANCER CENTER ONLY)
Abs Immature Granulocytes: 0.02 10*3/uL (ref 0.00–0.07)
Basophils Absolute: 0 10*3/uL (ref 0.0–0.1)
Basophils Relative: 0 %
Eosinophils Absolute: 0 10*3/uL (ref 0.0–0.5)
Eosinophils Relative: 0 %
HCT: 31.7 % — ABNORMAL LOW (ref 39.0–52.0)
Hemoglobin: 10.3 g/dL — ABNORMAL LOW (ref 13.0–17.0)
Immature Granulocytes: 0 %
Lymphocytes Relative: 32 %
Lymphs Abs: 1.4 10*3/uL (ref 0.7–4.0)
MCH: 28.9 pg (ref 26.0–34.0)
MCHC: 32.5 g/dL (ref 30.0–36.0)
MCV: 89 fL (ref 80.0–100.0)
Monocytes Absolute: 0.2 10*3/uL (ref 0.1–1.0)
Monocytes Relative: 4 %
Neutro Abs: 2.8 10*3/uL (ref 1.7–7.7)
Neutrophils Relative %: 64 %
Platelet Count: 232 10*3/uL (ref 150–400)
RBC: 3.56 MIL/uL — ABNORMAL LOW (ref 4.22–5.81)
RDW: 14.7 % (ref 11.5–15.5)
WBC Count: 4.5 10*3/uL (ref 4.0–10.5)
nRBC: 0 % (ref 0.0–0.2)

## 2022-05-21 MED ORDER — DEXAMETHASONE 4 MG PO TABS
40.0000 mg | ORAL_TABLET | Freq: Once | ORAL | Status: AC
  Administered 2022-05-21: 40 mg via ORAL
  Filled 2022-05-21: qty 10

## 2022-05-21 MED ORDER — DENOSUMAB 120 MG/1.7ML ~~LOC~~ SOLN
120.0000 mg | Freq: Once | SUBCUTANEOUS | Status: AC
  Administered 2022-05-21: 120 mg via SUBCUTANEOUS
  Filled 2022-05-21: qty 1.7

## 2022-05-21 MED ORDER — BORTEZOMIB CHEMO SQ INJECTION 3.5 MG (2.5MG/ML)
1.3000 mg/m2 | Freq: Once | INTRAMUSCULAR | Status: AC
  Administered 2022-05-21: 3 mg via SUBCUTANEOUS
  Filled 2022-05-21: qty 1.2

## 2022-05-21 NOTE — Progress Notes (Signed)
[place] [phone]   [phone]  PROGRESS NOTE  Patient Care Team: [name], MD as PCP - General (Family Medicine) Lorretta Harp, MD as PCP - Cardiology (Cardiology) Gala Romney Cristopher Estimable, MD as Attending Physician (Gastroenterology)  Hematological/Oncological History # IgG Kappa Multiple Myeloma, 1p32/1q21.  01/04/2022: MRI lumbar spine shows diffuse heterogeneous marrow signal with heterogeneous enhancement throughout the thoracolumbar spine.  Additionally, there is subacute-chronic L3 vertebral body compression fraction. 01/14/2022: establish care with Dr. Lorenso Courier  02/10/2022: bone marrow biopsy shows range of plasma cell involvement from 50% on the biopsy to 60% on aspirate smear slides and close to 90% on the clot section for an overall percentage estimated at approximately 80% overall. 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  02/26/2022: Cycle 1 of VRD therapy 04/01/2022: Cycle 2 of VRD therapy  04/23/2022: Cycle 3 of VRD therapy  05/14/2022: Cycle 4 of VRD therapy   Interval History:  [name] 51 y.o. male with medical history significant for newly diagnosed IgG kappa multiple myeloma who presents for a follow up visit. The patient's last visit was on 05/07/2022 at which time he started VRD therapy. In the interim, he was admitted for intractable low back pain. He presents today to start Cycle 4, Day 8 of VRD therapy.   On exam today [name] reports he has been feeling pretty good in the interim since her last visit.  He did reportedly have an episode last night where he could not sleep and went to the emergency department.  He was given a Ativan for anxiety and reports this has helped him feel more calm and relaxed.  He reports that he continues to have mostly soreness in his back.  He notes he is undergoing further evaluation for consideration of another kyphoplasty versus injection.  He is also  no longer wearing the brace unless he is doing something strenuous.  He reports no numbness or tingling of his fingers or toes.  He has been taking a laxative in order to keep his bowels moving smoothly.  Overall he is willing and able to proceed with treatment at this time.  He denies fevers, chills, sweats, shortness of breath, chest pain or cough. He has no other complaints. A full 10 point ROS was otherwise negative.  MEDICAL HISTORY:  Past Medical History:  Diagnosis Date   Allergy    Anemia    Atypical chest pain    Cancer (HCC)    Multiple myeloma with normocytic anemia   GERD (gastroesophageal reflux disease)    Hepatic cyst    Benign by MRI   Hiatal hernia    History of colonic polyps    Hyperlipidemia    Hypertension    Rectal bleeding    Tobacco abuse     SURGICAL HISTORY: Past Surgical History:  Procedure Laterality Date   COLONOSCOPY  01/10/2009     RMR: Normal rectum, normal colon; repeat in 2015 due to [place] of colon cancer   COLONOSCOPY N/A 01/09/2014   XTG:GYIRSWNI colonic polyps-removed as described   COLONOSCOPY N/A 09/18/2016   Procedure: COLONOSCOPY;  Surgeon: Daneil Dolin, MD;  [facility];  Service: Endoscopy;  Laterality: N/A;  730    ESOPHAGOGASTRODUODENOSCOPY  10/04/2007   RMR: Distal esophageal erosions consistent with erosive reflux esophagitis, patulous gastroesophageal junction status post passage of a  Maloney dilator, 71 [place].  Otherwise, unremarkable esophagus.  Hiatal hernia.  Otherwise normal stomach.  Bulbar erosion  ESOPHAGOGASTRODUODENOSCOPY N/A 01/09/2014   Erosive reflux esophagitis. Small hiatal hernia   HUMERUS IM NAIL Left 02/22/2022   Procedure: INTRAMEDULLARY (IM) NAIL HUMERAL;  Surgeon: Nicholes Stairs, MD;  Location: [facility];  Service: Orthopedics;  Laterality: Left;   I & D EXTREMITY Left 07/26/2018   Procedure: IRRIGATION AND DEBRIDEMENT EXTREMITY;  Surgeon: Dayna Barker, MD;  Location: [facility];  Service: Plastics;   Laterality: Left;   IR BONE TUMOR(S)RF ABLATION  02/05/2022   IR BONE TUMOR(S)RF ABLATION  02/24/2022   IR KYPHO EA ADDL LEVEL THORACIC OR LUMBAR  02/19/2022   IR KYPHO LUMBAR INC FX REDUCE BONE BX UNI/BIL CANNULATION INC/IMAGING  02/05/2022   IR KYPHO LUMBAR INC FX REDUCE BONE BX UNI/BIL CANNULATION INC/IMAGING  02/19/2022   PERCUTANEOUS PINNING Left 07/26/2018   Procedure: PERCUTANEOUS PINNING EXTREMITY;  Surgeon: Dayna Barker, MD;  Location: [facility];  Service: Plastics;  Laterality: Left;    SOCIAL HISTORY: Social History   Socioeconomic History   Marital status: Divorced    Spouse name: Not on file   Number of children: 2   Years of education: Not on file   Highest education level: Not on file  Occupational History   Occupation: Scientist, 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 [employer]   Smoking status: Every Day    Packs/day: 0.25    Years: 10.00    Total pack years: 2.50    Types: Cigarettes   Smokeless tobacco: Never  Vaping Use   Vaping Use: Never used  Substance and Sexual Activity   Alcohol use: Yes    Alcohol/week: 0.0 standard drinks of alcohol    Comment: occasional/wine on the weekends   Drug use: No   Sexual activity: Not on file  Other Topics Concern   Not on file  Social History Narrative   Right handed   Lives alone one story home   Social Determinants of Health   Financial Resource Strain: Not on file  Food Insecurity: No Food Insecurity (03/08/2022)   Hunger Vital Sign    Worried About Running Out of Food in the Last Year: Never true    Ran Out of Food in the Last Year: Never true  Transportation Needs: No Transportation Needs (03/08/2022)   PRAPARE - Hydrologist (Medical): No    Lack of Transportation (Non-Medical): No  Physical Activity: Not on file  Stress: Not on file  Social Connections: Not on file  Intimate Partner Violence: Not At Risk (03/08/2022)   Humiliation,  Afraid, Rape, and Kick questionnaire    Fear of Current or Ex-Partner: No    Emotionally Abused: No    Physically Abused: No    Sexually Abused: No    FAMILY HISTORY: Family History  Problem Relation Age of Onset   Heart disease Mother    Hypertension Mother    Diabetes Father    Hypertension Father    Colon cancer Sister        passed away from colon cancer, in her 41s   Heart attack Maternal Uncle    Prostate cancer Maternal Uncle    Diabetes Other    Pancreatic cancer Neg Hx    Rectal cancer Neg Hx    Esophageal cancer Neg Hx    Stomach cancer Neg Hx     ALLERGIES:  has No Known Allergies.  MEDICATIONS:  Current Outpatient Medications  Medication Sig Dispense Refill   REVLIMID  25 MG capsule Take 1 capsule (25 mg total) by mouth daily. [name] # [id number]     Date Obtained 05/12/22 Take one capsule daily x 14 days. None for following 7 days 14 capsule 0   acetaminophen (TYLENOL) 500 MG tablet Take 1 tablet (500 mg total) by mouth every 8 (eight) hours as needed for mild pain or moderate pain. 30 tablet 0   acyclovir (ZOVIRAX) 400 MG tablet Take 1 tablet (400 mg total) by mouth 2 (two) times daily. 60 tablet 3   allopurinol (ZYLOPRIM) 300 MG tablet Take 1 tablet (300 mg total) by mouth daily. 30 tablet 3   amLODipine (NORVASC) 5 MG tablet Take 1 tablet (5 mg total) by mouth daily.     aspirin EC 81 MG tablet Take 81 mg by mouth daily. Swallow whole.     clonazePAM (KLONOPIN) 0.5 MG tablet Take 1 tablet (0.5 mg total) by mouth 2 (two) times daily as needed for anxiety. 15 tablet 0   diazepam (VALIUM) 5 MG tablet SMARTSIG:1-2 Tablet(s) By Mouth     docusate sodium (COLACE) 100 MG capsule Take 1 capsule (100 mg total) by mouth 2 (two) times daily as needed for mild constipation. 30 capsule 3   DULoxetine (CYMBALTA) 20 MG capsule Take 1 capsule (20 mg total) by mouth daily. 30 capsule 1   gabapentin (NEURONTIN) 100 MG capsule Take 1 capsule (100 mg total) by mouth 2 (two)  times daily. 60 capsule 1   hydrochlorothiazide (HYDRODIURIL) 25 MG tablet Take 25 mg by mouth daily.     lisinopril (ZESTRIL) 40 MG tablet Take 40 mg by mouth daily.     LORazepam (ATIVAN) 0.5 MG tablet Take 1 tablet (0.5 mg total) by mouth 3 (three) times daily as needed for up to 7 days for anxiety or sleep. 7 tablet 0   Methocarbamol 1000 MG TABS Take 1,000 mg by mouth every 6 (six) hours as needed. 45 tablet 1   morphine (MS CONTIN) 60 MG 12 hr tablet Take 1 tablet (60 mg total) by mouth every 8 (eight) hours. 90 tablet 0   ondansetron (ZOFRAN) 8 MG tablet Take 1 tablet (8 mg total) by mouth every 8 (eight) hours as needed for nausea or vomiting. 60 tablet 0   Oxycodone HCl 20 MG TABS Take 1 tablet (20 mg total) by mouth every 6 (six) hours as needed. 60 tablet 0   pantoprazole (PROTONIX) 40 MG tablet Take 1 tablet (40 mg total) by mouth 2 (two) times daily. 60 tablet 2   polyethylene glycol powder (GLYCOLAX/MIRALAX) 17 GM/SCOOP powder Take 1 capful (17 g) with water by mouth daily. 238 g 0   prochlorperazine (COMPAZINE) 10 MG tablet Take 1 tablet (10 mg total) by mouth every 6 (six) hours as needed for nausea or vomiting. 60 tablet 0   rosuvastatin (CRESTOR) 10 MG tablet Take 10 mg by mouth daily.     senna (SENOKOT) 8.6 MG TABS tablet Take 1 tablet (8.6 mg total) by mouth in the morning and at bedtime. 120 tablet 3   No current facility-administered medications for this visit.    REVIEW OF SYSTEMS:   Constitutional: ( - ) fevers, ( - )  chills , ( - ) night sweats Eyes: ( - ) blurriness of vision, ( - ) double vision, ( - ) watery eyes Ears, nose, mouth, throat, and face: ( - ) mucositis, ( - ) sore throat Respiratory: ( - ) cough, ( - ) dyspnea, ( - )  wheezes Cardiovascular: ( - ) palpitation, ( - ) chest discomfort, ( - ) lower extremity swelling Gastrointestinal:  ( - ) nausea, ( - ) heartburn, ( - ) change in bowel habits Skin: ( - ) abnormal skin rashes Lymphatics: ( - ) new  lymphadenopathy, ( - ) easy bruising Neurological: ( - ) numbness, ( - ) tingling, ( - ) new weaknesses Behavioral/Psych: ( - ) mood change, ( - ) new changes  All other systems were reviewed with the patient and are negative.  PHYSICAL EXAMINATION: ECOG PERFORMANCE STATUS: 1 - Symptomatic but completely ambulatory  Vitals:   05/21/22 0822  BP: 109/74  Pulse: 91  Resp: 17  Temp: (!) 97.2 F (36.2 C)  SpO2: 95%   Filed Weights   05/21/22 0822  Weight: 219 lb 2 oz (99.4 kg)    GENERAL: Well-appearing young African-American male, alert, no distress and comfortable SKIN: skin color, texture, turgor are normal, no rashes or significant lesions EYES: conjunctiva are pink and non-injected, sclera clear LUNGS: clear to auscultation and percussion with normal breathing effort HEART: regular rate & rhythm and no murmurs and no lower extremity edema Musculoskeletal: no cyanosis of digits and no clubbing  PSYCH: alert & oriented x 3, fluent speech NEURO: no focal motor/sensory deficits  LABORATORY DATA:  I have reviewed the data as listed    Latest Ref Rng & Units 05/21/2022    7:58 AM 05/20/2022    8:18 AM 05/13/2022    8:00 AM  CBC  WBC 4.0 - 10.5 K/uL 4.5  4.9  5.2   Hemoglobin 13.0 - 17.0 g/dL 10.3  10.4  10.0   Hematocrit 39.0 - 52.0 % 31.7  32.5  30.4   Platelets 150 - 400 K/uL 232  232  194        Latest Ref Rng & Units 05/21/2022    7:58 AM 05/20/2022    8:18 AM 05/13/2022    8:15 AM  CMP  Glucose 70 - 99 mg/dL 143  104  135   BUN 6 - 20 mg/dL _0 Creatinine 0.61 - 1.24 mg/dL 0.90  0.83  0.76   Sodium 135 - 145 mmol/L 134  134  136   Potassium 3.5 - 5.1 mmol/L 4.0  3.9  3.4   Chloride 98 - 111 mmol/L 101  103  104   CO2 22 - 32 mmol/L _1 Calcium 8.9 - 10.3 mg/dL 8.6  9.0  8.3   Total Protein 6.5 - 8.1 g/dL 7.3  8.0  7.2   Total Bilirubin 0.3 - 1.2 mg/dL 0.4  0.5  0.4   Alkaline Phos 38 - 126 U/L 97  101  93   AST 15 - 41 U/L _2 ALT 0 - 44 U/L _3 Lab Results  Component Value Date   MPROTEIN 0.8 (H) 05/13/2022   MPROTEIN 1.3 (H) 04/23/2022   MPROTEIN 1.9 (H) 04/02/2022   Lab Results  Component Value Date   KPAFRELGTCHN 39.3 (H) 05/13/2022   KPAFRELGTCHN 44.2 (H) 04/23/2022   KPAFRELGTCHN 90.1 (H) 04/02/2022   LAMBDASER 13.4 05/13/2022   LAMBDASER 11.1 04/23/2022   LAMBDASER 11.7 04/02/2022   KAPLAMBRATIO 2.93 (H) 05/13/2022   KAPLAMBRATIO 3.98 (H) 04/23/2022   KAPLAMBRATIO 7.70 (H) 04/02/2022   RADIOGRAPHIC STUDIES: DG Hips Bilat W or Wo Pelvis 3-4 Views  Result  Date: 05/20/2022 CLINICAL DATA:  History of multiple myeloma. Hip pain and constipation. EXAM: DG HIP (WITH OR WITHOUT PELVIS) 3-4V BILAT COMPARISON:  01/22/2022 FINDINGS: Question slight progression of widespread subtle lucencies which could be seen with marked progression of myeloma. No dominant large lytic lesion. No evidence of left hip or proximal femur fracture. Previous vertebral augmentations in the lumbar spine. Question periosteal elevation of the right proximal femoral diaphysis but without evidence of fracture or lytic lesion. IMPRESSION: 1. Question some progression of widespread subtle lucencies which could be seen with notable progression of myeloma. No dominant large lytic lesion. No acute left hip finding specifically. 2. Question periosteal elevation of the right proximal femoral diaphysis but without evidence of fracture or lytic lesion. Electronically Signed   By: Nelson Chimes M.D.   On: 05/20/2022 09:47   DG Abdomen Acute W/Chest  Result Date: 05/20/2022 CLINICAL DATA:  Abdominal pain, constipation EXAM: DG ABDOMEN ACUTE WITH 1 VIEW CHEST COMPARISON:  02/20/2022 FINDINGS: No focal consolidation. No pleural effusion or pneumothorax. Heart and mediastinal contours are unremarkable. No bowel dilatation to suggest obstruction. No air-fluid levels. No pneumoperitoneum, portal venous gas, or pneumatosis. No urolithiasis. No  acute osseous abnormality. Prior vertebral body augmentation at L1, L2 on L3. Patchy area sclerosis throughout the axial skeleton as can be seen with marrow infiltrative process such as multiple myeloma. IMPRESSION: Negative abdominal radiographs.  No acute cardiopulmonary disease. Patchy area sclerosis throughout the axial skeleton as can be seen with marrow infiltrative process such as multiple myeloma. Electronically Signed   By: Kathreen Devoid M.D.   On: 05/20/2022 09:46    ASSESSMENT & PLAN [name] 51 y.o. male with medical history significant for newly diagnosed IgG kappa multiple myeloma who presents for a follow up visit.  After review of the labs, review the records, discussion with the patient the findings are most consistent with newly diagnosed multiple myeloma.  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  At this time would recommend proceeding with VRD chemotherapy with the intention of referral for ASCT when his labs are appropriate.  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 -- Recommend VRD chemotherapy with intention of proceeding to transplant. -- Labs at each visit to include CBC, CMP, LDH with monthly restaging labs SPEP and serum free light chains -- Started VRD therapy on 02/26/2022.  PLAN: --Presents today to start Cycle 4, Day 8 of VRD therapy --Labs from today were reviewed and adequate for treatment. WBC 4.5, hemoglobin 10.3, MCV 89, and platelets of 232. --Proceed with treatment today as planned without any dose modifications --Continue with weekly treatments and follow up in clinic in 2 weeks.   #Pathologic fractures-secondary to MM: --Involving L1, L2 and L3 compression fracture and left humerus fracture.  --Underwent kyphoplasty of L3 fracture on  02/19/2022 --Underwent medullary nailing of left humeral shaft on 02/22/2022  #Pain Medication -- Per palliative care continue on MS contin 60 mg q 8 hours, oxycodone 20 mg q 6 hours, change robaxin 1000 mg QID.  --Appreciate assistance of palliative care NP for further management of pain and other chronic symptoms.   #Supportive Care -- chemotherapy education complete -- port placement not required -- zofran 66m q8H PRN and compazine 135mPO q6H for nausea -- acyclovir 40030mO BID for VCZ prophylaxis --  allopurinol 362m PO daily for TLS prophylaxis -- Dental clearance for Zometa/Denosumab approved. Last dose of denosumab 05/21/2022. Continue monthly.   No orders of the defined types were placed in this encounter.   All questions were answered. The patient knows to call the clinic with any problems, questions or concerns.  I have spent a total of 30 minutes minutes of face-to-face and non-face-to-face time, preparing to see the [place] medically appropriate examination, counseling and educating the patient, ordering medications/tests, documenting clinical information in the electronic health record,  and care coordination.   JLedell Peoples MD Department of Hematology/Oncology [facility]ospPhone: [phone]: [phone]: [email or web address]   05/21/2022 4:20 PM

## 2022-05-21 NOTE — Progress Notes (Signed)
Nutrition Follow-up:  Patient with multiple myeloma. S/p kyphoplasty of L3 fracture on 9/27, IMN of left humeral shaft on 9/30. He is currently receiving VRD therapy q21d (started 10/4).   Noted 12/26 ED visit secondary to abdominal discomfort, constipation, restlessness  Met with patient during infusion. He reports discomfort as well as restlessness resolved and overall feeling well today. Patient has a good appetite. Recalls 3 good meals plus snacks. Patient typically eats 3-4 eggs, toast, fried apples, bacon or corn beef hash for breakfast. He reports eating a lot of ribeye steak and chicken. He is asking if he can have salmon, tuna/chicken salad, hotdogs, grilled oysters. Patient says he has been craving sweets which is new for him. He is drinking lots of water as well as 2 glasses of V8 splash. Patient has been walking with his sister. He continues to participate in PT once weekly. Patient denies nutrition impact symptoms.   Medications: reviewed   Labs: Na 134, glucose 143, Ca 8.6  Anthropometrics: Wt 219 lb 2 oz today stable  11/29 - 218 lb 14.4 oz  NUTRITION DIAGNOSIS: Unintentional weight loss stable   INTERVENTION:  Reviewed food safety and answered all questions. Encouraged pt to decrease intake of meats high in saturated fats and educated on lean proteins Continue bowel regimen per MD   MONITORING, EVALUATION, GOAL: weight trends, intake    NEXT VISIT: To be scheduled as needed

## 2022-05-21 NOTE — Patient Instructions (Signed)
[facility] ONCOL[facility]Y  Discharge Instructions: Thank you for choosing [facility] to provide your oncology and hematology care.   If you have a lab appointment with the [facility], please go directly to the [facility] and check in at the registration area.   Wear comfortable clothing and clothing appropriate for easy access to any Portacath or PICC line.   We strive to give you quality time with your provider. You may need to reschedule your appointment if you arrive late (15 or more minutes).  Arriving late affects you and other patients whose appointments are after yours.  Also, if you miss three or more appointments without notifying the office, you may be dismissed from the clinic at the provider's discretion.      For prescription refill requests, have your pharmacy contact our office and allow 72 hours for refills to be completed.    Today you received the following chemotherapy and/or immunotherapy agents: Velcade      To help prevent nausea and vomiting after your treatment, we encourage you to take your nausea medication as directed.  BELOW ARE SYMPTOMS THAT SHOULD BE REPORTED IMMEDIATELY: *FEVER GREATER THAN 100.4 F (38 C) OR HIGHER *CHILLS OR SWEATING *NAUSEA AND VOMITING THAT IS NOT CONTROLLED WITH YOUR NAUSEA MEDICATION *UNUSUAL SHORTNESS OF BREATH *UNUSUAL BRUISING OR BLEEDING *URINARY PROBLEMS (pain or burning when urinating, or frequent urination) *BOWEL PROBLEMS (unusual diarrhea, constipation, pain near the anus) TENDERNESS IN MOUTH AND THROAT WITH OR WITHOUT PRESENCE OF ULCERS (sore throat, sores in mouth, or a toothache) UNUSUAL RASH, SWELLING OR PAIN  UNUSUAL VAGINAL DISCHARGE OR ITCHING   Items with * indicate a potential emergency and should be followed up as soon as possible or go to the Emergency Department if any problems should occur.  Please show the CHEMOTHERAPY ALERT CARD or IMMUNOTHERAPY ALERT CARD at check-in to  the Emergency Department and triage nurse.  Should you have questions after your visit or need to cancel or reschedule your appointment, please contact [facility]  Dept: [phone]  and follow the prompts.  Office hours are 8:00 a.m. to 4:30 p.m. Monday - Friday. Please note that voicemails left after 4:00 p.m. may not be returned until the following business day.  We are closed weekends and major holidays. You have access to a nurse at all times for urgent questions. Please call the main number to the clinic Dept: [phone] and follow the prompts.   For any non-urgent questions, you may also contact your provider using MyChart. We now offer e-Visits for anyone 45 and older to request care online for non-urgent symptoms. For details visit [email or web address].   Also download the MyChart app! Go to the app store, search "MyChart", open the app, select [facility], and log in with your MyChart username and password.

## 2022-05-27 DIAGNOSIS — K5903 Drug induced constipation: Secondary | ICD-10-CM | POA: Diagnosis not present

## 2022-05-27 DIAGNOSIS — T402X5D Adverse effect of other opioids, subsequent encounter: Secondary | ICD-10-CM | POA: Diagnosis not present

## 2022-05-27 DIAGNOSIS — M8458XD Pathological fracture in neoplastic disease, other specified site, subsequent encounter for fracture with routine healing: Secondary | ICD-10-CM | POA: Diagnosis not present

## 2022-05-27 DIAGNOSIS — G894 Chronic pain syndrome: Secondary | ICD-10-CM | POA: Diagnosis not present

## 2022-05-27 DIAGNOSIS — C9000 Multiple myeloma not having achieved remission: Secondary | ICD-10-CM | POA: Diagnosis not present

## 2022-05-27 DIAGNOSIS — M109 Gout, unspecified: Secondary | ICD-10-CM | POA: Diagnosis not present

## 2022-05-27 DIAGNOSIS — M4802 Spinal stenosis, cervical region: Secondary | ICD-10-CM | POA: Diagnosis not present

## 2022-05-27 DIAGNOSIS — F17210 Nicotine dependence, cigarettes, uncomplicated: Secondary | ICD-10-CM | POA: Diagnosis not present

## 2022-05-27 DIAGNOSIS — G629 Polyneuropathy, unspecified: Secondary | ICD-10-CM | POA: Diagnosis not present

## 2022-05-27 DIAGNOSIS — K219 Gastro-esophageal reflux disease without esophagitis: Secondary | ICD-10-CM | POA: Diagnosis not present

## 2022-05-27 DIAGNOSIS — G35 Multiple sclerosis: Secondary | ICD-10-CM | POA: Diagnosis not present

## 2022-05-27 DIAGNOSIS — E782 Mixed hyperlipidemia: Secondary | ICD-10-CM | POA: Diagnosis not present

## 2022-05-27 DIAGNOSIS — S42202D Unspecified fracture of upper end of left humerus, subsequent encounter for fracture with routine healing: Secondary | ICD-10-CM | POA: Diagnosis not present

## 2022-05-27 DIAGNOSIS — I10 Essential (primary) hypertension: Secondary | ICD-10-CM | POA: Diagnosis not present

## 2022-05-27 DIAGNOSIS — D630 Anemia in neoplastic disease: Secondary | ICD-10-CM | POA: Diagnosis not present

## 2022-05-27 DIAGNOSIS — N179 Acute kidney failure, unspecified: Secondary | ICD-10-CM | POA: Diagnosis not present

## 2022-05-28 ENCOUNTER — Other Ambulatory Visit: Payer: Self-pay

## 2022-05-28 ENCOUNTER — Inpatient Hospital Stay: Payer: BC Managed Care – PPO

## 2022-05-28 ENCOUNTER — Inpatient Hospital Stay: Payer: BC Managed Care – PPO | Admitting: Licensed Clinical Social Worker

## 2022-05-28 ENCOUNTER — Inpatient Hospital Stay: Payer: BC Managed Care – PPO | Attending: Physician Assistant

## 2022-05-28 ENCOUNTER — Other Ambulatory Visit: Payer: Self-pay | Admitting: *Deleted

## 2022-05-28 ENCOUNTER — Encounter: Payer: Self-pay | Admitting: Nurse Practitioner

## 2022-05-28 ENCOUNTER — Inpatient Hospital Stay (HOSPITAL_BASED_OUTPATIENT_CLINIC_OR_DEPARTMENT_OTHER): Payer: BC Managed Care – PPO | Admitting: Nurse Practitioner

## 2022-05-28 VITALS — BP 109/75 | HR 79 | Temp 98.7°F | Resp 18 | Ht 69.0 in | Wt 221.2 lb

## 2022-05-28 DIAGNOSIS — I10 Essential (primary) hypertension: Secondary | ICD-10-CM | POA: Diagnosis not present

## 2022-05-28 DIAGNOSIS — F17210 Nicotine dependence, cigarettes, uncomplicated: Secondary | ICD-10-CM | POA: Insufficient documentation

## 2022-05-28 DIAGNOSIS — G893 Neoplasm related pain (acute) (chronic): Secondary | ICD-10-CM

## 2022-05-28 DIAGNOSIS — K5900 Constipation, unspecified: Secondary | ICD-10-CM

## 2022-05-28 DIAGNOSIS — Z7189 Other specified counseling: Secondary | ICD-10-CM

## 2022-05-28 DIAGNOSIS — Z5112 Encounter for antineoplastic immunotherapy: Secondary | ICD-10-CM | POA: Insufficient documentation

## 2022-05-28 DIAGNOSIS — M792 Neuralgia and neuritis, unspecified: Secondary | ICD-10-CM | POA: Diagnosis not present

## 2022-05-28 DIAGNOSIS — Z79899 Other long term (current) drug therapy: Secondary | ICD-10-CM | POA: Insufficient documentation

## 2022-05-28 DIAGNOSIS — Z7982 Long term (current) use of aspirin: Secondary | ICD-10-CM | POA: Insufficient documentation

## 2022-05-28 DIAGNOSIS — C9000 Multiple myeloma not having achieved remission: Secondary | ICD-10-CM | POA: Diagnosis not present

## 2022-05-28 DIAGNOSIS — Z79624 Long term (current) use of inhibitors of nucleotide synthesis: Secondary | ICD-10-CM | POA: Insufficient documentation

## 2022-05-28 LAB — CMP (CANCER CENTER ONLY)
ALT: 10 U/L (ref 0–44)
AST: 11 U/L — ABNORMAL LOW (ref 15–41)
Albumin: 3.8 g/dL (ref 3.5–5.0)
Alkaline Phosphatase: 92 U/L (ref 38–126)
Anion gap: 6 (ref 5–15)
BUN: 9 mg/dL (ref 6–20)
CO2: 28 mmol/L (ref 22–32)
Calcium: 8.9 mg/dL (ref 8.9–10.3)
Chloride: 100 mmol/L (ref 98–111)
Creatinine: 0.81 mg/dL (ref 0.61–1.24)
GFR, Estimated: >60 mL/min (ref >60)
Glucose, Bld: 112 mg/dL — ABNORMAL HIGH (ref 70–99)
Potassium: 3.8 mmol/L (ref 3.5–5.1)
Sodium: 134 mmol/L — ABNORMAL LOW (ref 135–145)
Total Bilirubin: 0.5 mg/dL (ref 0.3–1.2)
Total Protein: 7 g/dL (ref 6.5–8.1)

## 2022-05-28 LAB — CBC WITH DIFFERENTIAL (CANCER CENTER ONLY)
Abs Immature Granulocytes: 0.01 10*3/uL (ref 0.00–0.07)
Basophils Absolute: 0 10*3/uL (ref 0.0–0.1)
Basophils Relative: 0 %
Eosinophils Absolute: 0 10*3/uL (ref 0.0–0.5)
Eosinophils Relative: 0 %
HCT: 34.1 % — ABNORMAL LOW (ref 39.0–52.0)
Hemoglobin: 10.9 g/dL — ABNORMAL LOW (ref 13.0–17.0)
Immature Granulocytes: 0 %
Lymphocytes Relative: 43 %
Lymphs Abs: 1.7 10*3/uL (ref 0.7–4.0)
MCH: 28.6 pg (ref 26.0–34.0)
MCHC: 32 g/dL (ref 30.0–36.0)
MCV: 89.5 fL (ref 80.0–100.0)
Monocytes Absolute: 0.3 10*3/uL (ref 0.1–1.0)
Monocytes Relative: 9 %
Neutro Abs: 1.9 10*3/uL (ref 1.7–7.7)
Neutrophils Relative %: 48 %
Platelet Count: 222 10*3/uL (ref 150–400)
RBC: 3.81 MIL/uL — ABNORMAL LOW (ref 4.22–5.81)
RDW: 14.5 % (ref 11.5–15.5)
WBC Count: 3.9 10*3/uL — ABNORMAL LOW (ref 4.0–10.5)
nRBC: 0 % (ref 0.0–0.2)

## 2022-05-28 MED ORDER — DEXAMETHASONE 4 MG PO TABS
40.0000 mg | ORAL_TABLET | Freq: Once | ORAL | Status: AC
  Administered 2022-05-28: 40 mg via ORAL
  Filled 2022-05-28: qty 10

## 2022-05-28 MED ORDER — BORTEZOMIB CHEMO SQ INJECTION 3.5 MG (2.5MG/ML)
1.3000 mg/m2 | Freq: Once | INTRAMUSCULAR | Status: AC
  Administered 2022-05-28: 3 mg via SUBCUTANEOUS
  Filled 2022-05-28: qty 1.2

## 2022-05-28 MED ORDER — REVLIMID 25 MG PO CAPS: 25.0000 mg | ORAL_CAPSULE | Freq: Every day | ORAL | 0 refills | Status: DC

## 2022-05-28 NOTE — Progress Notes (Signed)
[facility]  [phone] [phone]) [phone]   Name: [name] Date: 05/28/2022 MRN: [id number]  DOB: [birth year]  Patient Care Team: [name], MD as PCP - General (Family Medicine) Lorretta Harp, MD as PCP - Cardiology (Cardiology) Gala Romney Cristopher Estimable, MD as Attending Physician (Gastroenterology)   I connected with [name] on 05/28/22 at  2:30 PM EST by phone and verified that I am speaking with the correct person using two identifiers.   I discussed the limitations, risks, security and privacy concerns of performing an evaluation and management service by telemedicine and the availability of in-person appointments. I also discussed with the patient that there may be a patient responsible charge related to this service. The patient expressed understanding and agreed to proceed.   Other persons participating in the visit and their role in the encounter: [place], RN   Patient's location: home  Provider's location: [facility] office   Chief Complaint: follow up on symptom management     INTERVAL HISTORY: [name] is a 52 y.o. male with oncologic medical history including IgG Kappa Multiple Myeloma, L1, L2, and L3 vertebral compression fraction s/p kyphoplasty(9/27) and IM nailing of left humeral shaft (9/30), currently undergoing VRD therapy.  Palliative ask to see for symptom management.   SOCIAL HISTORY:     reports that he has been smoking cigarettes. He has a 2.50 pack-year smoking history. He has never used smokeless tobacco. He reports current alcohol use. He reports that he does not use drugs.  ADVANCE DIRECTIVES:    CODE STATUS:   PAST MEDICAL HISTORY: Past Medical History:  Diagnosis Date   Allergy    Anemia    Atypical chest pain    Cancer (HCC)    Multiple myeloma with normocytic anemia   GERD (gastroesophageal reflux disease)    Hepatic cyst    Benign by MRI   Hiatal hernia    History of  colonic polyps    Hyperlipidemia    Hypertension    Rectal bleeding    Tobacco abuse     ALLERGIES:  has No Known Allergies.  MEDICATIONS:  Current Outpatient Medications  Medication Sig Dispense Refill   REVLIMID 25 MG capsule Take 1 capsule (25 mg total) by mouth daily. [name] # [id number]     Date Obtained 05/12/22 Take one capsule daily x 14 days. None for following 7 days 14 capsule 0   acetaminophen (TYLENOL) 500 MG tablet Take 1 tablet (500 mg total) by mouth every 8 (eight) hours as needed for mild pain or moderate pain. 30 tablet 0   acyclovir (ZOVIRAX) 400 MG tablet Take 1 tablet (400 mg total) by mouth 2 (two) times daily. 60 tablet 3   allopurinol (ZYLOPRIM) 300 MG tablet Take 1 tablet (300 mg total) by mouth daily. 30 tablet 3   amLODipine (NORVASC) 5 MG tablet Take 1 tablet (5 mg total) by mouth daily.     aspirin EC 81 MG tablet Take 81 mg by mouth daily. Swallow whole.     clonazePAM (KLONOPIN) 0.5 MG tablet Take 1 tablet (0.5 mg total) by mouth 2 (two) times daily as needed for anxiety. 15 tablet 0   diazepam (VALIUM) 5 MG tablet SMARTSIG:1-2 Tablet(s) By Mouth     docusate sodium (COLACE) 100 MG capsule Take 1 capsule (100 mg total) by mouth 2 (two) times daily as needed for mild constipation. 30 capsule 3   DULoxetine (CYMBALTA) 20 MG  capsule Take 1 capsule (20 mg total) by mouth daily. 30 capsule 1   gabapentin (NEURONTIN) 100 MG capsule Take 1 capsule (100 mg total) by mouth 2 (two) times daily. 60 capsule 1   hydrochlorothiazide (HYDRODIURIL) 25 MG tablet Take 25 mg by mouth daily.     lisinopril (ZESTRIL) 40 MG tablet Take 40 mg by mouth daily.     Methocarbamol 1000 MG TABS Take 1,000 mg by mouth every 6 (six) hours as needed. 45 tablet 1   morphine (MS CONTIN) 60 MG 12 hr tablet Take 1 tablet (60 mg total) by mouth every 8 (eight) hours. 90 tablet 0   ondansetron (ZOFRAN) 8 MG tablet Take 1 tablet (8 mg total) by mouth every 8 (eight) hours as needed for nausea  or vomiting. 60 tablet 0   Oxycodone HCl 20 MG TABS Take 1 tablet (20 mg total) by mouth every 6 (six) hours as needed. 60 tablet 0   pantoprazole (PROTONIX) 40 MG tablet Take 1 tablet (40 mg total) by mouth 2 (two) times daily. 60 tablet 2   polyethylene glycol powder (GLYCOLAX/MIRALAX) 17 GM/SCOOP powder Take 1 capful (17 g) with water by mouth daily. 238 g 0   prochlorperazine (COMPAZINE) 10 MG tablet Take 1 tablet (10 mg total) by mouth every 6 (six) hours as needed for nausea or vomiting. 60 tablet 0   rosuvastatin (CRESTOR) 10 MG tablet Take 10 mg by mouth daily.     senna (SENOKOT) 8.6 MG TABS tablet Take 1 tablet (8.6 mg total) by mouth in the morning and at bedtime. 120 tablet 3   No current facility-administered medications for this visit.    VITAL SIGNS: There were no vitals taken for this visit. There were no vitals filed for this visit.  Estimated body mass index is 32.36 kg/m as calculated from the following:   Height as of 03/26/22: _0  (1.753 m).   Weight as of 05/21/22: 219 lb 2 oz (99.4 kg).   PERFORMANCE STATUS (ECOG) : 1 - Symptomatic but completely ambulatory   IMPRESSION: I spoke with Mr. [name] via phone. No acute distress identified. Is remaining as active as possible. Appetite is good. Denies nausea, vomiting, or diarrhea.   Neoplasm related pain Jas reports his pain is well-controlled on current regimen.  We discussed at length.  He is tolerating MS Contin 60 mg every 8 hours, oxycodone 20 mg every 6 hours as needed for breakthrough pain was discontinued as he has not needed in some time.  Gabapentin 100 mg daily.  Given controlled pain no changes at this time.  Will continue to closely monitor.  Nausea/Indigestion Much improved.  Patient denies any concerns.  Constipation [name] does endorse occasional constipation despite current regimen.  He is taking senna daily.  Encouraged him to take 2 tablets twice daily to facilitate regularity.  He  verbalized understanding.  Will continue to closely monitor.  PLAN:  MS Contin 60 mg every 8 hours Gabapentin to 100 mg  daily Zofran and Compazine as needed for nausea Robaxin as needed for muscle spasms Senna 2 tabs twice daily for constipation I will plan to see patient back in 3-4 weeks in collaboration with other oncology appointments.  Patient knows to contact office sooner if needed.   Patient expressed understanding and was in agreement with this plan. He also understands that He can call the clinic at any time with any questions, concerns, or complaints.    Any controlled substances utilized were prescribed  in the context of palliative care. PDMP has been reviewed.    Time Total: 15 min   Visit consisted of counseling and education dealing with the complex and emotionally intense issues of symptom management and palliative care in the setting of serious and potentially life-threatening illness.Greater than 50%  of this time was spent counseling and coordinating care related to the above assessment and plan.  Alda Lea, AGPCNP-BC  Palliative Medicine Team/[facility]

## 2022-05-28 NOTE — Progress Notes (Signed)
[facility] CSW Progress Note  Holiday representative  met with patient in infusion to provide an additional [employer] card.    Pt reports he has been contacted by the [facility] and will be submitting the required documentation to apply for disability soon.      Henriette Combs, LCSW

## 2022-05-28 NOTE — Patient Instructions (Signed)
[facility] ONCOLOGY  Discharge Instructions: Thank you for choosing [facility] to provide your oncology and hematology care.   If you have a lab appointment with the [facility], please go directly to the [facility] and check in at the registration area.   Wear comfortable clothing and clothing appropriate for easy access to any Portacath or PICC line.   We strive to give you quality time with your provider. You may need to reschedule your appointment if you arrive late (15 or more minutes).  Arriving late affects you and other patients whose appointments are after yours.  Also, if you miss three or more appointments without notifying the office, you may be dismissed from the clinic at the provider's discretion.      For prescription refill requests, have your pharmacy contact our office and allow 72 hours for refills to be completed.    Today you received the following chemotherapy and/or immunotherapy agents: Velcade      To help prevent nausea and vomiting after your treatment, we encourage you to take your nausea medication as directed.  BELOW ARE SYMPTOMS THAT SHOULD BE REPORTED IMMEDIATELY: *FEVER GREATER THAN 100.4 F (38 C) OR HIGHER *CHILLS OR SWEATING *NAUSEA AND VOMITING THAT IS NOT CONTROLLED WITH YOUR NAUSEA MEDICATION *UNUSUAL SHORTNESS OF BREATH *UNUSUAL BRUISING OR BLEEDING *URINARY PROBLEMS (pain or burning when urinating, or frequent urination) *BOWEL PROBLEMS (unusual diarrhea, constipation, pain near the anus) TENDERNESS IN MOUTH AND THROAT WITH OR WITHOUT PRESENCE OF ULCERS (sore throat, sores in mouth, or a toothache) UNUSUAL RASH, SWELLING OR PAIN  UNUSUAL VAGINAL DISCHARGE OR ITCHING   Items with * indicate a potential emergency and should be followed up as soon as possible or go to the Emergency Department if any problems should occur.  Please show the CHEMOTHERAPY ALERT CARD or IMMUNOTHERAPY ALERT CARD at check-in to  the Emergency Department and triage nurse.  Should you have questions after your visit or need to cancel or reschedule your appointment, please contact [facility]  Dept: [phone]  and follow the prompts.  Office hours are 8:00 a.m. to 4:30 p.m. Monday - Friday. Please note that voicemails left after 4:00 p.m. may not be returned until the following business day.  We are closed weekends and major holidays. You have access to a nurse at all times for urgent questions. Please call the main number to the clinic Dept: [phone] and follow the prompts.   For any non-urgent questions, you may also contact your provider using MyChart. We now offer e-Visits for anyone 25 and older to request care online for non-urgent symptoms. For details visit [email or web address].   Also download the MyChart app! Go to the app store, search "MyChart", open the app, select [facility], and log in with your MyChart username and password.

## 2022-05-30 ENCOUNTER — Ambulatory Visit
Admission: RE | Admit: 2022-05-30 | Discharge: 2022-05-30 | Disposition: A | Discharge status: Home / Self Care | Payer: BC Managed Care – PPO | Source: Ambulatory Visit | Attending: Orthopaedic Surgery | Admitting: Orthopaedic Surgery

## 2022-05-30 DIAGNOSIS — M545 Low back pain, unspecified: Secondary | ICD-10-CM | POA: Diagnosis not present

## 2022-05-30 DIAGNOSIS — M47816 Spondylosis without myelopathy or radiculopathy, lumbar region: Secondary | ICD-10-CM | POA: Diagnosis not present

## 2022-05-30 DIAGNOSIS — S32030B Wedge compression fracture of third lumbar vertebra, initial encounter for open fracture: Secondary | ICD-10-CM

## 2022-05-30 DIAGNOSIS — M48061 Spinal stenosis, lumbar region without neurogenic claudication: Secondary | ICD-10-CM | POA: Diagnosis not present

## 2022-06-03 ENCOUNTER — Inpatient Hospital Stay: Payer: BC Managed Care – PPO | Admitting: Licensed Clinical Social Worker

## 2022-06-03 ENCOUNTER — Telehealth: Payer: Self-pay

## 2022-06-03 DIAGNOSIS — S32040A Wedge compression fracture of fourth lumbar vertebra, initial encounter for closed fracture: Secondary | ICD-10-CM | POA: Diagnosis not present

## 2022-06-03 DIAGNOSIS — C9000 Multiple myeloma not having achieved remission: Secondary | ICD-10-CM

## 2022-06-03 DIAGNOSIS — S32050A Wedge compression fracture of fifth lumbar vertebra, initial encounter for closed fracture: Secondary | ICD-10-CM | POA: Diagnosis not present

## 2022-06-03 DIAGNOSIS — S32030A Wedge compression fracture of third lumbar vertebra, initial encounter for closed fracture: Secondary | ICD-10-CM | POA: Diagnosis not present

## 2022-06-03 DIAGNOSIS — M6281 Muscle weakness (generalized): Secondary | ICD-10-CM | POA: Diagnosis not present

## 2022-06-03 DIAGNOSIS — M4802 Spinal stenosis, cervical region: Secondary | ICD-10-CM | POA: Diagnosis not present

## 2022-06-03 DIAGNOSIS — R2689 Other abnormalities of gait and mobility: Secondary | ICD-10-CM | POA: Diagnosis not present

## 2022-06-03 NOTE — Progress Notes (Signed)
[facility] CSW Progress Note  Clinical Education officer, museum  received request to call pt regarding financial concerns.  Pt requesting a [employer].  Pt originally scheduled for treatment 1/10,  but will need to go in for a procedure on his back and is requesting he pick up a [employer] card prior to procedure.  CSW to provide [employer] card to pt 1/10.      Henriette Combs, LCSW

## 2022-06-03 NOTE — Telephone Encounter (Signed)
Patient having kyphoplasty on 06/04/22. Per Dr. Lorenso Courier, [place] to cancel lab and infusion on 06/04/22.

## 2022-06-04 ENCOUNTER — Inpatient Hospital Stay: Payer: BC Managed Care – PPO

## 2022-06-04 ENCOUNTER — Ambulatory Visit: Payer: BC Managed Care – PPO | Admitting: Family Medicine

## 2022-06-04 ENCOUNTER — Inpatient Hospital Stay: Payer: BC Managed Care – PPO | Admitting: Licensed Clinical Social Worker

## 2022-06-04 DIAGNOSIS — M8008XA Age-related osteoporosis with current pathological fracture, vertebra(e), initial encounter for fracture: Secondary | ICD-10-CM | POA: Diagnosis not present

## 2022-06-04 DIAGNOSIS — C9000 Multiple myeloma not having achieved remission: Secondary | ICD-10-CM

## 2022-06-04 DIAGNOSIS — S32050A Wedge compression fracture of fifth lumbar vertebra, initial encounter for closed fracture: Secondary | ICD-10-CM | POA: Diagnosis not present

## 2022-06-04 DIAGNOSIS — C799 Secondary malignant neoplasm of unspecified site: Secondary | ICD-10-CM | POA: Diagnosis not present

## 2022-06-04 NOTE — Progress Notes (Signed)
[facility] CSW Progress Note  Holiday representative  provided pt w/ the last [employer] card he is eligible.  Pt is having difficulty paying for gas to get to treatment and does not have anyone available to drive him to appointments.  Pt referred to transportation to assist in getting pt to treatment.  CSW to remain available to assist as appropriate throughout treatment.        Henriette Combs, LCSW

## 2022-06-11 ENCOUNTER — Inpatient Hospital Stay (HOSPITAL_BASED_OUTPATIENT_CLINIC_OR_DEPARTMENT_OTHER): Payer: BC Managed Care – PPO | Admitting: Physician Assistant

## 2022-06-11 ENCOUNTER — Other Ambulatory Visit: Payer: Self-pay | Admitting: Orthopaedic Surgery

## 2022-06-11 ENCOUNTER — Other Ambulatory Visit: Payer: Self-pay

## 2022-06-11 ENCOUNTER — Inpatient Hospital Stay: Payer: BC Managed Care – PPO

## 2022-06-11 DIAGNOSIS — C9000 Multiple myeloma not having achieved remission: Secondary | ICD-10-CM

## 2022-06-11 DIAGNOSIS — Z79899 Other long term (current) drug therapy: Secondary | ICD-10-CM | POA: Diagnosis not present

## 2022-06-11 DIAGNOSIS — I10 Essential (primary) hypertension: Secondary | ICD-10-CM | POA: Diagnosis not present

## 2022-06-11 DIAGNOSIS — Z5112 Encounter for antineoplastic immunotherapy: Secondary | ICD-10-CM | POA: Diagnosis not present

## 2022-06-11 DIAGNOSIS — M546 Pain in thoracic spine: Secondary | ICD-10-CM

## 2022-06-11 DIAGNOSIS — Z79624 Long term (current) use of inhibitors of nucleotide synthesis: Secondary | ICD-10-CM | POA: Diagnosis not present

## 2022-06-11 DIAGNOSIS — F17210 Nicotine dependence, cigarettes, uncomplicated: Secondary | ICD-10-CM | POA: Diagnosis not present

## 2022-06-11 DIAGNOSIS — Z7982 Long term (current) use of aspirin: Secondary | ICD-10-CM | POA: Diagnosis not present

## 2022-06-11 LAB — CBC WITH DIFFERENTIAL (CANCER CENTER ONLY)
Abs Immature Granulocytes: 0.02 10*3/uL (ref 0.00–0.07)
Basophils Absolute: 0 10*3/uL (ref 0.0–0.1)
Basophils Relative: 0 %
Eosinophils Absolute: 0 10*3/uL (ref 0.0–0.5)
Eosinophils Relative: 0 %
HCT: 36.5 % — ABNORMAL LOW (ref 39.0–52.0)
Hemoglobin: 12 g/dL — ABNORMAL LOW (ref 13.0–17.0)
Immature Granulocytes: 1 %
Lymphocytes Relative: 46 %
Lymphs Abs: 1.6 10*3/uL (ref 0.7–4.0)
MCH: 28.6 pg (ref 26.0–34.0)
MCHC: 32.9 g/dL (ref 30.0–36.0)
MCV: 87.1 fL (ref 80.0–100.0)
Monocytes Absolute: 0.2 10*3/uL (ref 0.1–1.0)
Monocytes Relative: 5 %
Neutro Abs: 1.7 10*3/uL (ref 1.7–7.7)
Neutrophils Relative %: 48 %
Platelet Count: 208 10*3/uL (ref 150–400)
RBC: 4.19 MIL/uL — ABNORMAL LOW (ref 4.22–5.81)
RDW: 13.8 % (ref 11.5–15.5)
WBC Count: 3.5 10*3/uL — ABNORMAL LOW (ref 4.0–10.5)
nRBC: 0 % (ref 0.0–0.2)

## 2022-06-11 LAB — CMP (CANCER CENTER ONLY)
ALT: 11 U/L (ref 0–44)
AST: 12 U/L — ABNORMAL LOW (ref 15–41)
Albumin: 3.9 g/dL (ref 3.5–5.0)
Alkaline Phosphatase: 89 U/L (ref 38–126)
Anion gap: 5 (ref 5–15)
BUN: 9 mg/dL (ref 6–20)
CO2: 29 mmol/L (ref 22–32)
Calcium: 9 mg/dL (ref 8.9–10.3)
Chloride: 99 mmol/L (ref 98–111)
Creatinine: 0.84 mg/dL (ref 0.61–1.24)
GFR, Estimated: >60 mL/min (ref >60)
Glucose, Bld: 94 mg/dL (ref 70–99)
Potassium: 4.1 mmol/L (ref 3.5–5.1)
Sodium: 133 mmol/L — ABNORMAL LOW (ref 135–145)
Total Bilirubin: 0.4 mg/dL (ref 0.3–1.2)
Total Protein: 7.4 g/dL (ref 6.5–8.1)

## 2022-06-11 MED ORDER — BORTEZOMIB CHEMO SQ INJECTION 3.5 MG (2.5MG/ML)
1.3000 mg/m2 | Freq: Once | INTRAMUSCULAR | Status: AC
  Administered 2022-06-11: 3 mg via SUBCUTANEOUS
  Filled 2022-06-11: qty 1.2

## 2022-06-11 MED ORDER — LIDOCAINE HCL 2 % IJ SOLN: 20.0000 mL | Freq: Once | INTRAMUSCULAR | Status: DC

## 2022-06-11 MED ORDER — DEXAMETHASONE 4 MG PO TABS
40.0000 mg | ORAL_TABLET | Freq: Once | ORAL | Status: AC
  Administered 2022-06-11: 40 mg via ORAL
  Filled 2022-06-11: qty 10

## 2022-06-11 NOTE — Progress Notes (Addendum)
[place] [phone]   [phone]  PROGRESS NOTE  Patient Care Team: [name], MD as PCP - General (Family Medicine) Lorretta Harp, MD as PCP - Cardiology (Cardiology) Gala Romney Cristopher Estimable, MD as Attending Physician (Gastroenterology)  Hematological/Oncological History # IgG Kappa Multiple Myeloma, 1p32/1q21.  01/04/2022: MRI lumbar spine shows diffuse heterogeneous marrow signal with heterogeneous enhancement throughout the thoracolumbar spine.  Additionally, there is subacute-chronic L3 vertebral body compression fraction. 01/14/2022: establish care with Dr. Lorenso Courier  02/10/2022: bone marrow biopsy shows range of plasma cell involvement from 50% on the biopsy to 60% on aspirate smear slides and close to 90% on the clot section for an overall percentage estimated at approximately 80% overall. 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  02/26/2022: Cycle 1 of VRD therapy 04/01/2022: Cycle 2 of VRD therapy  04/23/2022: Cycle 3 of VRD therapy  05/14/2022: Cycle 4 of VRD therapy  06/04/2022: Underwent kyphoplasty so treatment was cancelled 06/11/2022: Resume Cycle 5 of VRD therapy   Interval History:  AUGUSTO DECKMAN 52 y.o. male with medical history significant for newly diagnosed IgG kappa multiple myeloma who presents for a follow up visit. The patient's last visit was on 05/21/2022 at which time he continued on VRD therapy. In the interim, he underwent kyphoplasty last week.   On exam today Mr. Horsey reports having some soreness after the kyphoplasty but overall pain has improved. He is able to ambulate independently but uses a can for support. He reports his energy and appetite are stable. He denies nausea, vomiting or abdominal pain. He reports constipation with straining and hematochezia. He was previously taking colace but has stopped. He experiences some numbness in his feet and hands only when he lies down  for bed. Otherwise he denies any residual neuropathy. He denies any interference with grip or balance. He denies fevers, chills, sweats, shortness of breath, chest pain or cough. He has no other complaints. A full 10 point ROS was otherwise negative.  MEDICAL HISTORY:  Past Medical History:  Diagnosis Date   Allergy    Anemia    Atypical chest pain    Cancer (HCC)    Multiple myeloma with normocytic anemia   GERD (gastroesophageal reflux disease)    Hepatic cyst    Benign by MRI   Hiatal hernia    History of colonic polyps    Hyperlipidemia    Hypertension    Rectal bleeding    Tobacco abuse     SURGICAL HISTORY: Past Surgical History:  Procedure Laterality Date   COLONOSCOPY  01/10/2009     RMR: Normal rectum, normal colon; repeat in 2015 due to [place] of colon cancer   COLONOSCOPY N/A 01/09/2014   VQM:[id number] colonic polyps-removed as described   COLONOSCOPY N/A 09/18/2016   Procedure: COLONOSCOPY;  Surgeon: Daneil Dolin, MD;  [facility];  Service: Endoscopy;  Laterality: N/A;  730    ESOPHAGOGASTRODUODENOSCOPY  10/04/2007   RMR: Distal esophageal erosions consistent with erosive reflux esophagitis, patulous gastroesophageal junction status post passage of a  Mal[place] dilator, 52 [place].  Otherwise, unremarkable esophagus.  Hiatal hernia.  Otherwise normal stomach.  Bulbar erosion   ESOPHAGOGASTRODUODENOSCOPY N/A 01/09/2014   Erosive reflux esophagitis. Small hiatal hernia   HUMERUS IM NAIL Left 02/22/2022   Procedure: INTRAMEDULLARY (IM) NAIL HUMERAL;  Surgeon: Nicholes Stairs, MD;  Location: [facility];  Service: Orthopedics;  Laterality: Left;   I & D EXTREMITY Left 07/26/2018   Procedure:  IRRIGATION AND DEBRIDEMENT EXTREMITY;  Surgeon: Dayna Barker, MD;  Location: [facility];  Service: Plastics;  Laterality: Left;   IR BONE TUMOR(S)RF ABLATION  02/05/2022   IR BONE TUMOR(S)RF ABLATION  02/24/2022   IR KYPHO EA ADDL LEVEL THORACIC OR LUMBAR  02/19/2022   IR KYPHO LUMBAR  INC FX REDUCE BONE BX UNI/BIL CANNULATION INC/IMAGING  02/05/2022   IR KYPHO LUMBAR INC FX REDUCE BONE BX UNI/BIL CANNULATION INC/IMAGING  02/19/2022   PERCUTANEOUS PINNING Left 07/26/2018   Procedure: PERCUTANEOUS PINNING EXTREMITY;  Surgeon: Dayna Barker, MD;  Location: [facility];  Service: Plastics;  Laterality: Left;    SOCIAL HISTORY: Social History   Socioeconomic History   Marital status: Divorced    Spouse name: Not on file   Number of children: 2   Years of education: Not on file   Highest education level: Not on file  Occupational History   Occupation: Scientist, 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[place] [employer]   Smoking status: Every Day    Packs/day: 0.25    Years: 10.00    Total pack years: 2.50    Types: Cigarettes   Smokeless tobacco: Never  Vaping Use   Vaping Use: Never used  Substance and Sexual Activity   Alcohol use: Yes    Alcohol/week: 0.0 standard drinks of alcohol    Comment: occasional/wine on the weekends   Drug use: No   Sexual activity: Not on file  Other Topics Concern   Not on file  Social History Narrative   Right handed   Lives alone one story home   Social Determinants of Health   Financial Resource Strain: Not on file  Food Insecurity: No Food Insecurity (03/08/2022)   Hunger Vital Sign    Worried About Running Out of Food in the Last Year: Never true    Ran Out of Food in the Last Year: Never true  Transportation Needs: No Transportation Needs (03/08/2022)   PRAPARE - Hydrologist (Medical): No    Lack of Transportation (Non-Medical): No  Physical Activity: Not on file  Stress: Not on file  Social Connections: Not on file  Intimate Partner Violence: Not At Risk (03/08/2022)   Humiliation, Afraid, Rape, and Kick questionnaire    Fear of Current or Ex-Partner: No    Emotionally Abused: No    Physically Abused: No    Sexually Abused: No    FAMILY HISTORY: Family  History  Problem Relation Age of Onset   Heart disease Mother    Hypertension Mother    Diabetes Father    Hypertension Father    Colon cancer Sister        passed away from colon cancer, in her 75s   Heart attack Maternal Uncle    Prostate cancer Maternal Uncle    Diabetes Other    Pancreatic cancer Neg Hx    Rectal cancer Neg Hx    Esophageal cancer Neg Hx    Stomach cancer Neg Hx     ALLERGIES:  has No Known Allergies.  MEDICATIONS:  Current Outpatient Medications  Medication Sig Dispense Refill   acetaminophen (TYLENOL) 500 MG tablet Take 1 tablet (500 mg total) by mouth every 8 (eight) hours as needed for mild pain or moderate pain. 30 tablet 0   acyclovir (ZOVIRAX) 400 MG tablet Take 1 tablet (400 mg total) by mouth 2 (two) times daily. 60 tablet 3   allopurinol (  ZYLOPRIM) 300 MG tablet Take 1 tablet (300 mg total) by mouth daily. 30 tablet 3   amLODipine (NORVASC) 5 MG tablet Take 1 tablet (5 mg total) by mouth daily.     aspirin EC 81 MG tablet Take 81 mg by mouth daily. Swallow whole.     clonazePAM (KLONOPIN) 0.5 MG tablet Take 1 tablet (0.5 mg total) by mouth 2 (two) times daily as needed for anxiety. 15 tablet 0   diazepam (VALIUM) 5 MG tablet SMARTSIG:1-2 Tablet(s) By Mouth     docusate sodium (COLACE) 100 MG capsule Take 1 capsule (100 mg total) by mouth 2 (two) times daily as needed for mild constipation. 30 capsule 3   DULoxetine (CYMBALTA) 20 MG capsule Take 1 capsule (20 mg total) by mouth daily. 30 capsule 1   gabapentin (NEURONTIN) 100 MG capsule Take 1 capsule (100 mg total) by mouth 2 (two) times daily. 60 capsule 1   hydrochlorothiazide (HYDRODIURIL) 25 MG tablet Take 25 mg by mouth daily.     lisinopril (ZESTRIL) 40 MG tablet Take 40 mg by mouth daily.     Methocarbamol 1000 MG TABS Take 1,000 mg by mouth every 6 (six) hours as needed. 45 tablet 1   morphine (MS CONTIN) 60 MG 12 hr tablet Take 1 tablet (60 mg total) by mouth every 8 (eight) hours. 90 tablet  0   ondansetron (ZOFRAN) 8 MG tablet Take 1 tablet (8 mg total) by mouth every 8 (eight) hours as needed for nausea or vomiting. 60 tablet 0   Oxycodone HCl 20 MG TABS Take 1 tablet (20 mg total) by mouth every 6 (six) hours as needed. 60 tablet 0   pantoprazole (PROTONIX) 40 MG tablet Take 1 tablet (40 mg total) by mouth 2 (two) times daily. 60 tablet 2   polyethylene glycol powder (GLYCOLAX/MIRALAX) 17 GM/SCOOP powder Take 1 capful (17 g) with water by mouth daily. 238 g 0   prochlorperazine (COMPAZINE) 10 MG tablet Take 1 tablet (10 mg total) by mouth every 6 (six) hours as needed for nausea or vomiting. 60 tablet 0   REVLIMID 25 MG capsule Take 1 capsule (25 mg total) by mouth daily. [name] #   [id number]   Date Obtained 05/28/2022 Take one capsule daily x 14 days. None for following 7 days 14 capsule 0   rosuvastatin (CRESTOR) 10 MG tablet Take 10 mg by mouth daily.     senna (SENOKOT) 8.6 MG TABS tablet Take 1 tablet (8.6 mg total) by mouth in the morning and at bedtime. 120 tablet 3   No current facility-administered medications for this visit.    REVIEW OF SYSTEMS:   Constitutional: ( - ) fevers, ( - )  chills , ( - ) night sweats Eyes: ( - ) blurriness of vision, ( - ) double vision, ( - ) watery eyes Ears, nose, mouth, throat, and face: ( - ) mucositis, ( - ) sore throat Respiratory: ( - ) cough, ( - ) dyspnea, ( - ) wheezes Cardiovascular: ( - ) palpitation, ( - ) chest discomfort, ( - ) lower extremity swelling Gastrointestinal:  ( - ) nausea, ( - ) heartburn, ( + ) change in bowel habits Skin: ( - ) abnormal skin rashes Lymphatics: ( - ) new lymphadenopathy, ( - ) easy bruising Neurological: ( - ) numbness, ( - ) tingling, ( - ) new weaknesses Behavioral/Psych: ( - ) mood change, ( - ) new changes  All other systems were reviewed  with the patient and are negative.  PHYSICAL EXAMINATION: ECOG PERFORMANCE STATUS: 1 - Symptomatic but completely ambulatory  Vitals:   06/11/22  0920  BP: (!) 121/59  Pulse: 80  Resp: 20  Temp: (!) 97.3 F (36.3 C)  SpO2: 99%   Filed Weights   06/11/22 0920  Weight: 220 lb 1.6 oz (99.8 kg)    GENERAL: Well-appearing young African-American male, alert, no distress and comfortable SKIN: skin color, texture, turgor are normal, no rashes or significant lesions EYES: conjunctiva are pink and non-injected, sclera clear LUNGS: clear to auscultation and percussion with normal breathing effort HEART: regular rate & rhythm and no murmurs and no lower extremity edema Musculoskeletal: no cyanosis of digits and no clubbing  PSYCH: alert & oriented x 3, fluent speech NEURO: no focal motor/sensory deficits  LABORATORY DATA:  I have reviewed the data as listed    Latest Ref Rng & Units 06/11/2022    9:04 AM 05/28/2022    8:04 AM 05/21/2022    7:58 AM  CBC  WBC 4.0 - 10.5 K/uL 3.5  3.9  4.5   Hemoglobin 13.0 - 17.0 g/dL 12.0  10.9  10.3   Hematocrit 39.0 - 52.0 % 36.5  34.1  31.7   Platelets 150 - 400 K/uL 208  222  232        Latest Ref Rng & Units 05/28/2022    8:04 AM 05/21/2022    7:58 AM 05/20/2022    8:18 AM  CMP  Glucose 70 - 99 mg/dL 112  143  104   BUN 6 - 20 mg/dL '9  8  10   '$ Creatinine 0.61 - 1.24 mg/dL 0.81  0.90  0.83   Sodium 135 - 145 mmol/L 134  134  134   Potassium 3.5 - 5.1 mmol/L 3.8  4.0  3.9   Chloride 98 - 111 mmol/L 100  101  103   CO2 22 - 32 mmol/L '28  24  22   '$ Calcium 8.9 - 10.3 mg/dL 8.9  8.6  9.0   Total Protein 6.5 - 8.1 g/dL 7.0  7.3  8.0   Total Bilirubin 0.3 - 1.2 mg/dL 0.5  0.4  0.5   Alkaline Phos 38 - 126 U/L 92  97  101   AST 15 - 41 U/L '11  11  13   '$ ALT 0 - 44 U/L '10  10  13     '$ Lab Results  Component Value Date   MPROTEIN 0.8 (H) 05/13/2022   MPROTEIN 1.3 (H) 04/23/2022   MPROTEIN 1.9 (H) 04/02/2022   Lab Results  Component Value Date   KPAFRELGTCHN 39.3 (H) 05/13/2022   KPAFRELGTCHN 44.2 (H) 04/23/2022   KPAFRELGTCHN 90.1 (H) 04/02/2022   LAMBDASER 13.4 05/13/2022    LAMBDASER 11.1 04/23/2022   LAMBDASER 11.7 04/02/2022   KAPLAMBRATIO 2.93 (H) 05/13/2022   KAPLAMBRATIO 3.98 (H) 04/23/2022   KAPLAMBRATIO 7.70 (H) 04/02/2022   RADIOGRAPHIC STUDIES: MR LUMBAR SPINE WO CONTRAST  Result Date: 06/02/2022 CLINICAL DATA:  Low back pain for few months, no known injury EXAM: MRI LUMBAR SPINE WITHOUT CONTRAST TECHNIQUE: Multiplanar, multisequence MR imaging of the lumbar spine was performed. No intravenous contrast was administered. COMPARISON:  03/07/2022, 02/17/2022 FINDINGS: Segmentation:  Standard. Alignment:  Physiologic. Vertebrae:  Diffuse marrow heterogeneity without focal lesion. Acute-subacute T10 vertebral body compression fracture with approximately 60% central height loss and marrow edema. Acute-subacute T11 vertebral body compression fracture with approximately 40% height loss. Acute-subacute T12 vertebral body compression  fracture with approximately 40% anterior height loss. Chronic L1 vertebral body compression fracture with approximately 15% central height loss and methylmethacrylate from prior augmentation. Chronic L2 vertebral body compression fracture with approximately 30% central height loss and methylmethacrylate from prior augmentation. Chronic L3 vertebral body compression fracture with approximately 75% central height loss with methylmethacrylate from prior augmentation. Acute-subacute L4 vertebral body compression fracture with approximately 60% height loss and marrow edema. Acute-subacute L5 vertebral body compression fracture with mild marrow edema and approximately 10% height loss. Conus medullaris and cauda equina: Conus extends to the T12-L1 level. Conus and cauda equina appear normal. Paraspinal and other soft tissues: No acute paraspinal abnormality. Disc levels: Disc spaces: Disc heights are maintained. T9-10: Mild broad-based disc bulge. No foraminal or central canal stenosis. T10-11: Broad-based disc bulge. Mild spinal stenosis. Mild bilateral  facet arthropathy. Mild bilateral foraminal stenosis. T12-L1: No significant disc bulge. Mild bilateral facet arthropathy. No foraminal or central canal stenosis. L1-L2: Mild broad-based disc bulge. Mild bilateral facet arthropathy. No foraminal or central canal stenosis. L2-L3: Mild broad-based disc bulge. Mild bilateral facet arthropathy. Mild spinal stenosis. Mild left foraminal stenosis. No right foraminal stenosis. L3-L4: Mild broad-based disc bulge. Mild bilateral facet arthropathy. Mild bilateral foraminal stenosis. No spinal stenosis. L4-L5: Mild broad-based disc bulge. Moderate bilateral facet arthropathy. Mild bilateral foraminal stenosis. L5-S1: No significant disc bulge. No neural foraminal stenosis. No central canal stenosis. IMPRESSION: 1. Acute-subacute T10 vertebral body compression fracture with approximately 60% central height loss and marrow edema. 2. Acute-subacute T11 vertebral body compression fracture with approximately 40% height loss. 3. Acute-subacute T12 vertebral body compression fracture with approximately 40% anterior height loss. 4. Acute-subacute L4 vertebral body compression fracture with approximately 60% height loss and marrow edema. 5. Acute-subacute L5 vertebral body compression fracture with mild marrow edema and approximately 10% height loss. 6. Lumbar spine spondylosis as described above. Electronically Signed   By: Kathreen Devoid M.D.   On: 06/02/2022 10:09   DG Hips Bilat W or Wo Pelvis 3-4 Views  Result Date: 05/20/2022 CLINICAL DATA:  History of multiple myeloma. Hip pain and constipation. EXAM: DG HIP (WITH OR WITHOUT PELVIS) 3-4V BILAT COMPARISON:  01/22/2022 FINDINGS: Question slight progression of widespread subtle lucencies which could be seen with marked progression of myeloma. No dominant large lytic lesion. No evidence of left hip or proximal femur fracture. Previous vertebral augmentations in the lumbar spine. Question periosteal elevation of the right proximal  femoral diaphysis but without evidence of fracture or lytic lesion. IMPRESSION: 1. Question some progression of widespread subtle lucencies which could be seen with notable progression of myeloma. No dominant large lytic lesion. No acute left hip finding specifically. 2. Question periosteal elevation of the right proximal femoral diaphysis but without evidence of fracture or lytic lesion. Electronically Signed   By: Nelson Chimes M.D.   On: 05/20/2022 09:47   DG Abdomen Acute W/Chest  Result Date: 05/20/2022 CLINICAL DATA:  Abdominal pain, constipation EXAM: DG ABDOMEN ACUTE WITH 1 VIEW CHEST COMPARISON:  02/20/2022 FINDINGS: No focal consolidation. No pleural effusion or pneumothorax. Heart and mediastinal contours are unremarkable. No bowel dilatation to suggest obstruction. No air-fluid levels. No pneumoperitoneum, portal venous gas, or pneumatosis. No urolithiasis. No acute osseous abnormality. Prior vertebral body augmentation at L1, L2 on L3. Patchy area sclerosis throughout the axial skeleton as can be seen with marrow infiltrative process such as multiple myeloma. IMPRESSION: Negative abdominal radiographs.  No acute cardiopulmonary disease. Patchy area sclerosis throughout the axial skeleton as can be  seen with marrow infiltrative process such as multiple myeloma. Electronically Signed   By: Kathreen Devoid M.D.   On: 05/20/2022 09:46    ASSESSMENT & PLAN HURBERT DURAN 52 y.o. male with medical history significant for IgG kappa multiple myeloma who presents for a follow up visit.  After review of the labs, review the records, discussion with the patient the findings are most consistent with newly diagnosed multiple myeloma.  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  At this time would recommend proceeding with VRD chemotherapy with the intention of referral for ASCT when his labs are appropriate.  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  --  Diagnosis confirmed with lytic lesions of the spine as well as biopsy-proven plasmacytoma with 80% plasma cell involvement of the bone marrow. -- Recommend VRD chemotherapy with intention of proceeding to transplant. -- Labs at each visit to include CBC, CMP, LDH with monthly restaging labs SPEP and serum free light chains -- Started VRD therapy on 02/26/2022.  PLAN: --Presents today to start Cycle 5, Day 8 of VRD therapy --Labs from today were reviewed and adequate for treatment. WBC 3.5, hemoglobin 12.0, MCV 87.1, and platelets of 208. --Proceed with treatment today as planned without any dose modifications --Continue with weekly treatments and follow up in clinic in 2 weeks.   #Constipation: --Recommend to continue on colace but add miralax as well.   #Pathologic fractures-secondary to MM: --Involving L1, L2 and L3 compression fracture and left humerus fracture.  --Underwent kyphoplasty of L3 fracture on 02/19/2022 --Underwent medullary nailing of left humeral shaft on 02/22/2022 --Underwent kyphoplasty on 06/04/2022.   #Pain Medication -- Per palliative care continue on MS contin 60 mg q 8 hours, oxycodone 20 mg q 6 hours, change robaxin 1000 mg QID.  --Appreciate assistance of palliative care NP for further management of pain and other chronic symptoms.   #Supportive Care -- chemotherapy education complete -- port placement not required -- zofran '8mg'$  q8H PRN and compazine '10mg'$  PO q6H for nausea -- acyclovir '400mg'$  PO BID for VCZ prophylaxis -- allopurinol '300mg'$  PO daily for TLS prophylaxis -- Dental clearance for Zometa/Denosumab approved. Last dose of denosumab 05/21/2022. Continue monthly.   No orders of the defined types were placed in this encounter.   All questions were answered. The patient knows to call the clinic with any problems, questions or concerns.  I have spent a total of 30 minutes minutes of face-to-face and non-face-to-face time, preparing to see the  [facility] a medically appropriate examination, counseling and educating the patient, ordering medications/tests, documenting clinical information in the electronic health record,  and care coordination.   Dede Query PA-C Dept of Hematology and [place] at [facility] Phone: [phone]    06/11/2022 9:37 AM

## 2022-06-11 NOTE — Patient Instructions (Signed)
[facility]  Discharge Instructions: Thank you for choosing [facility] to provide your oncology and hematology care.   If you have a lab appointment with the [facility], please go directly to the [facility] and check in at the registration area.   Wear comfortable clothing and clothing appropriate for easy access to any Portacath or PICC line.   We strive to give you quality time with your provider. You may need to reschedule your appointment if you arrive late (15 or more minutes).  Arriving late affects you and other patients whose appointments are after yours.  Also, if you miss three or more appointments without notifying the office, you may be dismissed from the clinic at the provider's discretion.      For prescription refill requests, have your pharmacy contact our office and allow 72 hours for refills to be completed.    Today you received the following chemotherapy and/or immunotherapy agents: bortezomib      To help prevent nausea and vomiting after your treatment, we encourage you to take your nausea medication as directed.  BELOW ARE SYMPTOMS THAT SHOULD BE REPORTED IMMEDIATELY: *FEVER GREATER THAN 100.4 F (38 C) OR HIGHER *CHILLS OR SWEATING *NAUSEA AND VOMITING THAT IS NOT CONTROLLED WITH YOUR NAUSEA MEDICATION *UNUSUAL SHORTNESS OF BREATH *UNUSUAL BRUISING OR BLEEDING *URINARY PROBLEMS (pain or burning when urinating, or frequent urination) *BOWEL PROBLEMS (unusual diarrhea, constipation, pain near the anus) TENDERNESS IN MOUTH AND THROAT WITH OR WITHOUT PRESENCE OF ULCERS (sore throat, sores in mouth, or a toothache) UNUSUAL RASH, SWELLING OR PAIN  UNUSUAL VAGINAL DISCHARGE OR ITCHING   Items with * indicate a potential emergency and should be followed up as soon as possible or go to the Emergency Department if any problems should occur.  Please show the CHEMOTHERAPY ALERT CARD or IMMUNOTHERAPY ALERT CARD at check-in to  the Emergency Department and triage nurse.  Should you have questions after your visit or need to cancel or reschedule your appointment, please contact [facility]  Dept: [phone]  and follow the prompts.  Office hours are 8:00 a.m. to 4:30 p.m. Monday - Friday. Please note that voicemails left after 4:00 p.m. may not be returned until the following business day.  We are closed weekends and major holidays. You have access to a nurse at all times for urgent questions. Please call the main number to the clinic Dept: [phone] and follow the prompts.   For any non-urgent questions, you may also contact your provider using MyChart. We now offer e-Visits for anyone 31 and older to request care online for non-urgent symptoms. For details visit [email or web address].   Also download the MyChart app! Go to the app store, search "MyChart", open the app, select [facility], and log in with your MyChart username and password.

## 2022-06-16 ENCOUNTER — Other Ambulatory Visit: Payer: Self-pay

## 2022-06-16 MED ORDER — REVLIMID 25 MG PO CAPS: 25.0000 mg | ORAL_CAPSULE | Freq: Every day | ORAL | 0 refills | Status: DC

## 2022-06-18 ENCOUNTER — Inpatient Hospital Stay (HOSPITAL_BASED_OUTPATIENT_CLINIC_OR_DEPARTMENT_OTHER): Payer: BC Managed Care – PPO | Admitting: Nurse Practitioner

## 2022-06-18 ENCOUNTER — Other Ambulatory Visit: Payer: Self-pay

## 2022-06-18 ENCOUNTER — Encounter: Payer: Self-pay | Admitting: Nurse Practitioner

## 2022-06-18 ENCOUNTER — Inpatient Hospital Stay: Payer: BC Managed Care – PPO

## 2022-06-18 VITALS — BP 122/78 | HR 80 | Temp 98.8°F | Resp 18 | Ht 69.0 in | Wt 221.3 lb

## 2022-06-18 DIAGNOSIS — G893 Neoplasm related pain (acute) (chronic): Secondary | ICD-10-CM

## 2022-06-18 DIAGNOSIS — Z515 Encounter for palliative care: Secondary | ICD-10-CM

## 2022-06-18 DIAGNOSIS — M792 Neuralgia and neuritis, unspecified: Secondary | ICD-10-CM | POA: Diagnosis not present

## 2022-06-18 DIAGNOSIS — Z79899 Other long term (current) drug therapy: Secondary | ICD-10-CM | POA: Diagnosis not present

## 2022-06-18 DIAGNOSIS — Z7982 Long term (current) use of aspirin: Secondary | ICD-10-CM | POA: Diagnosis not present

## 2022-06-18 DIAGNOSIS — C9000 Multiple myeloma not having achieved remission: Secondary | ICD-10-CM | POA: Diagnosis not present

## 2022-06-18 DIAGNOSIS — F17210 Nicotine dependence, cigarettes, uncomplicated: Secondary | ICD-10-CM | POA: Diagnosis not present

## 2022-06-18 DIAGNOSIS — Z5112 Encounter for antineoplastic immunotherapy: Secondary | ICD-10-CM | POA: Diagnosis not present

## 2022-06-18 DIAGNOSIS — K5900 Constipation, unspecified: Secondary | ICD-10-CM

## 2022-06-18 DIAGNOSIS — I10 Essential (primary) hypertension: Secondary | ICD-10-CM | POA: Diagnosis not present

## 2022-06-18 DIAGNOSIS — Z79624 Long term (current) use of inhibitors of nucleotide synthesis: Secondary | ICD-10-CM | POA: Diagnosis not present

## 2022-06-18 LAB — CMP (CANCER CENTER ONLY)
ALT: 12 U/L (ref 0–44)
AST: 11 U/L — ABNORMAL LOW (ref 15–41)
Albumin: 3.9 g/dL (ref 3.5–5.0)
Alkaline Phosphatase: 82 U/L (ref 38–126)
Anion gap: 6 (ref 5–15)
BUN: 11 mg/dL (ref 6–20)
CO2: 28 mmol/L (ref 22–32)
Calcium: 9.2 mg/dL (ref 8.9–10.3)
Chloride: 100 mmol/L (ref 98–111)
Creatinine: 0.91 mg/dL (ref 0.61–1.24)
GFR, Estimated: >60 mL/min (ref >60)
Glucose, Bld: 93 mg/dL (ref 70–99)
Potassium: 4 mmol/L (ref 3.5–5.1)
Sodium: 134 mmol/L — ABNORMAL LOW (ref 135–145)
Total Bilirubin: 0.4 mg/dL (ref 0.3–1.2)
Total Protein: 7.5 g/dL (ref 6.5–8.1)

## 2022-06-18 LAB — CBC WITH DIFFERENTIAL (CANCER CENTER ONLY)
Abs Immature Granulocytes: 0.02 10*3/uL (ref 0.00–0.07)
Basophils Absolute: 0 10*3/uL (ref 0.0–0.1)
Basophils Relative: 0 %
Eosinophils Absolute: 0 10*3/uL (ref 0.0–0.5)
Eosinophils Relative: 0 %
HCT: 36.5 % — ABNORMAL LOW (ref 39.0–52.0)
Hemoglobin: 11.9 g/dL — ABNORMAL LOW (ref 13.0–17.0)
Immature Granulocytes: 1 %
Lymphocytes Relative: 38 %
Lymphs Abs: 1.5 10*3/uL (ref 0.7–4.0)
MCH: 28.1 pg (ref 26.0–34.0)
MCHC: 32.6 g/dL (ref 30.0–36.0)
MCV: 86.1 fL (ref 80.0–100.0)
Monocytes Absolute: 0.5 10*3/uL (ref 0.1–1.0)
Monocytes Relative: 12 %
Neutro Abs: 2 10*3/uL (ref 1.7–7.7)
Neutrophils Relative %: 49 %
Platelet Count: 237 10*3/uL (ref 150–400)
RBC: 4.24 MIL/uL (ref 4.22–5.81)
RDW: 13.8 % (ref 11.5–15.5)
WBC Count: 4 10*3/uL (ref 4.0–10.5)
nRBC: 0 % (ref 0.0–0.2)

## 2022-06-18 MED ORDER — BORTEZOMIB CHEMO SQ INJECTION 3.5 MG (2.5MG/ML)
1.3000 mg/m2 | Freq: Once | INTRAMUSCULAR | Status: AC
  Administered 2022-06-18: 3 mg via SUBCUTANEOUS
  Filled 2022-06-18: qty 1.2

## 2022-06-18 MED ORDER — BORTEZOMIB CHEMO SQ INJECTION 3.5 MG (2.5MG/ML)
1.3000 mg/m2 | Freq: Once | INTRAMUSCULAR | Status: DC
  Filled 2022-06-18: qty 1.2

## 2022-06-18 MED ORDER — DENOSUMAB 120 MG/1.7ML ~~LOC~~ SOLN
120.0000 mg | Freq: Once | SUBCUTANEOUS | Status: AC
  Administered 2022-06-18: 120 mg via SUBCUTANEOUS
  Filled 2022-06-18: qty 1.7

## 2022-06-18 MED ORDER — DEXAMETHASONE 4 MG PO TABS
40.0000 mg | ORAL_TABLET | Freq: Once | ORAL | Status: AC
  Administered 2022-06-18: 40 mg via ORAL
  Filled 2022-06-18: qty 10

## 2022-06-18 NOTE — Patient Instructions (Addendum)
[facility]  Discharge Instructions: Thank you for choosing [facility] to provide your oncology and hematology care.   If you have a lab appointment with the [facility], please go directly to the [facility] and check in at the registration area.   Wear comfortable clothing and clothing appropriate for easy access to any Portacath or PICC line.   We strive to give you quality time with your provider. You may need to reschedule your appointment if you arrive late (15 or more minutes).  Arriving late affects you and other patients whose appointments are after yours.  Also, if you miss three or more appointments without notifying the office, you may be dismissed from the clinic at the provider's discretion.      For prescription refill requests, have your pharmacy contact our office and allow 72 hours for refills to be completed.    Today you received the following chemotherapy and/or immunotherapy agents: bortezomib  and xgeva   To help prevent nausea and vomiting after your treatment, we encourage you to take your nausea medication as directed.  BELOW ARE SYMPTOMS THAT SHOULD BE REPORTED IMMEDIATELY: *FEVER GREATER THAN 100.4 F (38 C) OR HIGHER *CHILLS OR SWEATING *NAUSEA AND VOMITING THAT IS NOT CONTROLLED WITH YOUR NAUSEA MEDICATION *UNUSUAL SHORTNESS OF BREATH *UNUSUAL BRUISING OR BLEEDING *URINARY PROBLEMS (pain or burning when urinating, or frequent urination) *BOWEL PROBLEMS (unusual diarrhea, constipation, pain near the anus) TENDERNESS IN MOUTH AND THROAT WITH OR WITHOUT PRESENCE OF ULCERS (sore throat, sores in mouth, or a toothache) UNUSUAL RASH, SWELLING OR PAIN  UNUSUAL VAGINAL DISCHARGE OR ITCHING   Items with * indicate a potential emergency and should be followed up as soon as possible or go to the Emergency Department if any problems should occur.  Please show the CHEMOTHERAPY ALERT CARD or IMMUNOTHERAPY ALERT CARD  at check-in to the Emergency Department and triage nurse.  Should you have questions after your visit or need to cancel or reschedule your appointment, please contact [facility]  Dept: [phone]  and follow the prompts.  Office hours are 8:00 a.m. to 4:30 p.m. Monday - Friday. Please note that voicemails left after 4:00 p.m. may not be returned until the following business day.  We are closed weekends and major holidays. You have access to a nurse at all times for urgent questions. Please call the main number to the clinic Dept: [phone] and follow the prompts.   For any non-urgent questions, you may also contact your provider using MyChart. We now offer e-Visits for anyone 61 and older to request care online for non-urgent symptoms. For details visit [email or web address].   Also download the MyChart app! Go to the app store, search "MyChart", open the app, select [facility], and log in with your MyChart username and password.

## 2022-06-18 NOTE — Progress Notes (Signed)
[facility]  [phone] [phone]   Name: [name] Date: 06/18/2022 MRN: [id number]  DOB: 1970-07-25  Patient Care Team: [name], MD as PCP - General (Family Medicine) Lorretta Harp, MD as PCP - Cardiology (Cardiology) Gala Romney Cristopher Estimable, MD as Attending Physician (Gastroenterology)     INTERVAL HISTORY: [name] is a 52 y.o. male with oncologic medical history including IgG Kappa Multiple Myeloma, L1, L2, and L3 vertebral compression fraction s/p kyphoplasty(9/27) and IM nailing of left humeral shaft (9/30), currently undergoing VRD therapy.  Palliative ask to see for symptom management.   SOCIAL HISTORY:     reports that he has been smoking cigarettes. He has a 2.50 pack-year smoking history. He has never used smokeless tobacco. He reports current alcohol use. He reports that he does not use drugs.  ADVANCE DIRECTIVES:    CODE STATUS:   PAST MEDICAL HISTORY: Past Medical History:  Diagnosis Date   Allergy    Anemia    Atypical chest pain    Cancer (HCC)    Multiple myeloma with normocytic anemia   GERD (gastroesophageal reflux disease)    Hepatic cyst    Benign by MRI   Hiatal hernia    History of colonic polyps    Hyperlipidemia    Hypertension    Rectal bleeding    Tobacco abuse     ALLERGIES:  has No Known Allergies.  MEDICATIONS:  Current Outpatient Medications  Medication Sig Dispense Refill   acetaminophen (TYLENOL) 500 MG tablet Take 1 tablet (500 mg total) by mouth every 8 (eight) hours as needed for mild pain or moderate pain. 30 tablet 0   acyclovir (ZOVIRAX) 400 MG tablet Take 1 tablet (400 mg total) by mouth 2 (two) times daily. 60 tablet 3   allopurinol (ZYLOPRIM) 300 MG tablet Take 1 tablet (300 mg total) by mouth daily. 30 tablet 3   amLODipine (NORVASC) 5 MG tablet Take 1 tablet (5 mg total) by mouth daily.     aspirin EC 81 MG tablet Take 81 mg by mouth daily.  Swallow whole.     clonazePAM (KLONOPIN) 0.5 MG tablet Take 1 tablet (0.5 mg total) by mouth 2 (two) times daily as needed for anxiety. 15 tablet 0   diazepam (VALIUM) 5 MG tablet SMARTSIG:1-2 Tablet(s) By Mouth     docusate sodium (COLACE) 100 MG capsule Take 1 capsule (100 mg total) by mouth 2 (two) times daily as needed for mild constipation. 30 capsule 3   DULoxetine (CYMBALTA) 20 MG capsule Take 1 capsule (20 mg total) by mouth daily. 30 capsule 1   gabapentin (NEURONTIN) 100 MG capsule Take 1 capsule (100 mg total) by mouth 2 (two) times daily. 60 capsule 1   hydrochlorothiazide (HYDRODIURIL) 25 MG tablet Take 25 mg by mouth daily.     lisinopril (ZESTRIL) 40 MG tablet Take 40 mg by mouth daily.     Methocarbamol 1000 MG TABS Take 1,000 mg by mouth every 6 (six) hours as needed. 45 tablet 1   morphine (MS CONTIN) 60 MG 12 hr tablet Take 1 tablet (60 mg total) by mouth every 8 (eight) hours. 90 tablet 0   ondansetron (ZOFRAN) 8 MG tablet Take 1 tablet (8 mg total) by mouth every 8 (eight) hours as needed for nausea or vomiting. 60 tablet 0   Oxycodone HCl 20 MG TABS Take 1 tablet (20 mg total) by mouth every 6 (six) hours as  needed. 60 tablet 0   pantoprazole (PROTONIX) 40 MG tablet Take 1 tablet (40 mg total) by mouth 2 (two) times daily. 60 tablet 2   polyethylene glycol powder (GLYCOLAX/MIRALAX) 17 GM/SCOOP powder Take 1 capful (17 g) with water by mouth daily. 238 g 0   prochlorperazine (COMPAZINE) 10 MG tablet Take 1 tablet (10 mg total) by mouth every 6 (six) hours as needed for nausea or vomiting. 60 tablet 0   REVLIMID 25 MG capsule Take 1 capsule (25 mg total) by mouth daily. [name] #   [id number]  Date Obtained 06/16/2022 Take one capsule daily x 14 days. None for following 7 days 14 capsule 0   rosuvastatin (CRESTOR) 10 MG tablet Take 10 mg by mouth daily.     senna (SENOKOT) 8.6 MG TABS tablet Take 1 tablet (8.6 mg total) by mouth in the morning and at bedtime. 120 tablet 3    No current facility-administered medications for this visit.    VITAL SIGNS: There were no vitals taken for this visit. There were no vitals filed for this visit.  Estimated body mass index is 32.5 kg/m as calculated from the following:   Height as of 05/28/22: '5\' 9"'$  (1.753 m).   Weight as of 06/11/22: 220 lb 1.6 oz (99.8 kg).   PERFORMANCE STATUS (ECOG) : 1 - Symptomatic but completely ambulatory   IMPRESSION: [name] presents to clinic today for follow-up. No acute distress. He is doing exceptionally well. Appetite is great. Denies nausea, vomiting, or diarrhea. Has increased his activity. Ambulatory with cane.   Neoplasm related pain [name] reports his pain is much better and well-controlled. We have been able to wean off of MS Contin. He has oxycodone 20 mg as needed. Does not require daily. Continues with Gabapentin 100 mg twice daily, Cymbalta 20 mg, and robaxin as needed. Given controlled pain no changes at this time.  Will continue to closely monitor.  Nausea/Indigestion Resolved. Taking Protonix daily.    Constipation Controlled with daily Miralax.  [name]  PLAN:  Successfully weaned off of MS Contin Oxycodone 20 mg as needed. Not requiring daily.  Gabapentin to 100 mg twice daily Cymbalta 20 mg daily  Zofran and Compazine as needed for nausea Robaxin as needed for muscle spasms Miralax daily for constipation I will plan to see patient back in 3-4 weeks in collaboration with other oncology appointments.  Patient knows to contact office sooner if needed.   Patient expressed understanding and was in agreement with this plan. He also understands that He can call the clinic at any time with any questions, concerns, or complaints.    Any controlled substances utilized were prescribed in the context of palliative care. PDMP has been reviewed.   Time Total: 25 min   Visit consisted of counseling and education dealing with the complex and emotionally intense issues of  symptom management and palliative care in the setting of serious and potentially life-threatening illness.Greater than 50%  of this time was spent counseling and coordinating care related to the above assessment and plan.  Alda Lea, AGPCNP-BC  Palliative Medicine Team/[place] [facility] [place]

## 2022-06-20 DIAGNOSIS — M25512 Pain in left shoulder: Secondary | ICD-10-CM | POA: Insufficient documentation

## 2022-06-24 LAB — MULTIPLE MYELOMA PANEL, SERUM
Albumin SerPl Elph-Mcnc: 3.9 g/dL (ref 2.9–4.4)
Albumin/Glob SerPl: 1.3 (ref 0.7–1.7)
Alpha 1: 0.2 g/dL (ref 0.0–0.4)
Alpha2 Glob SerPl Elph-Mcnc: 0.8 g/dL (ref 0.4–1.0)
B-Globulin SerPl Elph-Mcnc: 0.9 g/dL (ref 0.7–1.3)
Gamma Glob SerPl Elph-Mcnc: 1.2 g/dL (ref 0.4–1.8)
Globulin, Total: 3.1 g/dL (ref 2.2–3.9)
IgA: 48 mg/dL — ABNORMAL LOW (ref 90–386)
IgG (Immunoglobin G), Serum: 1442 mg/dL (ref 603–1613)
IgM (Immunoglobulin M), Srm: 52 mg/dL (ref 20–172)
M Protein SerPl Elph-Mcnc: 0.8 g/dL — ABNORMAL HIGH
Total Protein ELP: 7 g/dL (ref 6.0–8.5)

## 2022-06-25 ENCOUNTER — Encounter: Payer: Self-pay | Admitting: Family Medicine

## 2022-06-25 ENCOUNTER — Inpatient Hospital Stay: Payer: BC Managed Care – PPO

## 2022-06-25 ENCOUNTER — Other Ambulatory Visit: Payer: Self-pay

## 2022-06-25 ENCOUNTER — Ambulatory Visit (INDEPENDENT_AMBULATORY_CARE_PROVIDER_SITE_OTHER): Payer: BC Managed Care – PPO | Admitting: Family Medicine

## 2022-06-25 VITALS — BP 105/68 | HR 99 | Temp 97.7°F | Resp 16 | Wt 222.8 lb

## 2022-06-25 VITALS — BP 113/72 | HR 77 | Temp 98.9°F | Resp 18 | Wt 221.5 lb

## 2022-06-25 DIAGNOSIS — F32A Depression, unspecified: Secondary | ICD-10-CM

## 2022-06-25 DIAGNOSIS — Z5112 Encounter for antineoplastic immunotherapy: Secondary | ICD-10-CM | POA: Diagnosis not present

## 2022-06-25 DIAGNOSIS — K219 Gastro-esophageal reflux disease without esophagitis: Secondary | ICD-10-CM | POA: Diagnosis not present

## 2022-06-25 DIAGNOSIS — F419 Anxiety disorder, unspecified: Secondary | ICD-10-CM | POA: Diagnosis not present

## 2022-06-25 DIAGNOSIS — Z7982 Long term (current) use of aspirin: Secondary | ICD-10-CM | POA: Diagnosis not present

## 2022-06-25 DIAGNOSIS — C9000 Multiple myeloma not having achieved remission: Secondary | ICD-10-CM | POA: Diagnosis not present

## 2022-06-25 DIAGNOSIS — I10 Essential (primary) hypertension: Secondary | ICD-10-CM

## 2022-06-25 DIAGNOSIS — F17210 Nicotine dependence, cigarettes, uncomplicated: Secondary | ICD-10-CM

## 2022-06-25 DIAGNOSIS — Z79899 Other long term (current) drug therapy: Secondary | ICD-10-CM | POA: Diagnosis not present

## 2022-06-25 DIAGNOSIS — Z79624 Long term (current) use of inhibitors of nucleotide synthesis: Secondary | ICD-10-CM | POA: Diagnosis not present

## 2022-06-25 LAB — CBC WITH DIFFERENTIAL (CANCER CENTER ONLY)
Abs Immature Granulocytes: 0.01 10*3/uL (ref 0.00–0.07)
Basophils Absolute: 0 10*3/uL (ref 0.0–0.1)
Basophils Relative: 0 %
Eosinophils Absolute: 0 10*3/uL (ref 0.0–0.5)
Eosinophils Relative: 0 %
HCT: 35.3 % — ABNORMAL LOW (ref 39.0–52.0)
Hemoglobin: 11.5 g/dL — ABNORMAL LOW (ref 13.0–17.0)
Immature Granulocytes: 0 %
Lymphocytes Relative: 47 %
Lymphs Abs: 2.9 10*3/uL (ref 0.7–4.0)
MCH: 28.4 pg (ref 26.0–34.0)
MCHC: 32.6 g/dL (ref 30.0–36.0)
MCV: 87.2 fL (ref 80.0–100.0)
Monocytes Absolute: 0.5 10*3/uL (ref 0.1–1.0)
Monocytes Relative: 8 %
Neutro Abs: 2.8 10*3/uL (ref 1.7–7.7)
Neutrophils Relative %: 45 %
Platelet Count: 193 10*3/uL (ref 150–400)
RBC: 4.05 MIL/uL — ABNORMAL LOW (ref 4.22–5.81)
RDW: 14.1 % (ref 11.5–15.5)
WBC Count: 6.2 10*3/uL (ref 4.0–10.5)
nRBC: 0 % (ref 0.0–0.2)

## 2022-06-25 LAB — CMP (CANCER CENTER ONLY)
ALT: 14 U/L (ref 0–44)
AST: 12 U/L — ABNORMAL LOW (ref 15–41)
Albumin: 3.8 g/dL (ref 3.5–5.0)
Alkaline Phosphatase: 72 U/L (ref 38–126)
Anion gap: 7 (ref 5–15)
BUN: 14 mg/dL (ref 6–20)
CO2: 25 mmol/L (ref 22–32)
Calcium: 8.6 mg/dL — ABNORMAL LOW (ref 8.9–10.3)
Chloride: 102 mmol/L (ref 98–111)
Creatinine: 0.88 mg/dL (ref 0.61–1.24)
GFR, Estimated: >60 mL/min (ref >60)
Glucose, Bld: 121 mg/dL — ABNORMAL HIGH (ref 70–99)
Potassium: 4.2 mmol/L (ref 3.5–5.1)
Sodium: 134 mmol/L — ABNORMAL LOW (ref 135–145)
Total Bilirubin: 0.3 mg/dL (ref 0.3–1.2)
Total Protein: 7.3 g/dL (ref 6.5–8.1)

## 2022-06-25 MED ORDER — DEXAMETHASONE 4 MG PO TABS
40.0000 mg | ORAL_TABLET | Freq: Once | ORAL | Status: AC
  Administered 2022-06-25: 40 mg via ORAL
  Filled 2022-06-25: qty 10

## 2022-06-25 MED ORDER — BORTEZOMIB CHEMO SQ INJECTION 3.5 MG (2.5MG/ML)
1.3000 mg/m2 | Freq: Once | INTRAMUSCULAR | Status: AC
  Administered 2022-06-25: 3 mg via SUBCUTANEOUS
  Filled 2022-06-25: qty 1.2

## 2022-06-25 NOTE — Patient Instructions (Signed)
[facility]  Discharge Instructions: Thank you for choosing [facility] to provide your oncology and hematology care.   If you have a lab appointment with the Cancer Center, please go directly to the Cancer Center and check in at the registration area.   Wear comfortable clothing and clothing appropriate for easy access to any Portacath or PICC line.   We strive to give you quality time with your provider. You may need to reschedule your appointment if you arrive late (15 or more minutes).  Arriving late affects you and other patients whose appointments are after yours.  Also, if you miss three or more appointments without notifying the office, you may be dismissed from the clinic at the provider's discretion.      For prescription refill requests, have your pharmacy contact our office and allow 72 hours for refills to be completed.    Today you received the following chemotherapy and/or immunotherapy agents: Velcade        To help prevent nausea and vomiting after your treatment, we encourage you to take your nausea medication as directed.  BELOW ARE SYMPTOMS THAT SHOULD BE REPORTED IMMEDIATELY: *FEVER GREATER THAN 100.4 F (38 C) OR HIGHER *CHILLS OR SWEATING *NAUSEA AND VOMITING THAT IS NOT CONTROLLED WITH YOUR NAUSEA MEDICATION *UNUSUAL SHORTNESS OF BREATH *UNUSUAL BRUISING OR BLEEDING *URINARY PROBLEMS (pain or burning when urinating, or frequent urination) *BOWEL PROBLEMS (unusual diarrhea, constipation, pain near the anus) TENDERNESS IN MOUTH AND THROAT WITH OR WITHOUT PRESENCE OF ULCERS (sore throat, sores in mouth, or a toothache) UNUSUAL RASH, SWELLING OR PAIN  UNUSUAL VAGINAL DISCHARGE OR ITCHING   Items with * indicate a potential emergency and should be followed up as soon as possible or go to the Emergency Department if any problems should occur.  Please show the CHEMOTHERAPY ALERT CARD or IMMUNOTHERAPY ALERT CARD at  check-in to the Emergency Department and triage nurse.  Should you have questions after your visit or need to cancel or reschedule your appointment, please contact [facility]  Dept: [phone]  and follow the prompts.  Office hours are 8:00 a.m. to 4:30 p.m. Monday - Friday. Please note that voicemails left after 4:00 p.m. may not be returned until the following business day.  We are closed weekends and major holidays. You have access to a nurse at all times for urgent questions. Please call the main number to the clinic Dept: [phone] and follow the prompts.   For any non-urgent questions, you may also contact your provider using MyChart. We now offer e-Visits for anyone 18 and older to request care online for non-urgent symptoms. For details visit [email or web address].   Also download the MyChart app! Go to the app store, search "MyChart", open the app, select [facility], and log in with your MyChart username and password.  

## 2022-06-26 ENCOUNTER — Ambulatory Visit
Admission: RE | Admit: 2022-06-26 | Discharge: 2022-06-26 | Disposition: A | Discharge status: Home / Self Care | Payer: BC Managed Care – PPO | Source: Ambulatory Visit | Attending: Orthopaedic Surgery | Admitting: Orthopaedic Surgery

## 2022-06-26 DIAGNOSIS — Z8579 Personal history of other malignant neoplasms of lymphoid, hematopoietic and related tissues: Secondary | ICD-10-CM | POA: Diagnosis not present

## 2022-06-26 DIAGNOSIS — S22079A Unspecified fracture of T9-T10 vertebra, initial encounter for closed fracture: Secondary | ICD-10-CM | POA: Diagnosis not present

## 2022-06-26 DIAGNOSIS — M546 Pain in thoracic spine: Secondary | ICD-10-CM

## 2022-06-26 DIAGNOSIS — M47814 Spondylosis without myelopathy or radiculopathy, thoracic region: Secondary | ICD-10-CM | POA: Diagnosis not present

## 2022-06-26 DIAGNOSIS — S22089A Unspecified fracture of T11-T12 vertebra, initial encounter for closed fracture: Secondary | ICD-10-CM | POA: Diagnosis not present

## 2022-06-26 LAB — KAPPA/LAMBDA LIGHT CHAINS
Kappa free light chain: 24 mg/L — ABNORMAL HIGH (ref 3.3–19.4)
Kappa, lambda light chain ratio: 2.42 — ABNORMAL HIGH (ref 0.26–1.65)
Lambda free light chains: 9.9 mg/L (ref 5.7–26.3)

## 2022-06-27 ENCOUNTER — Encounter: Payer: Self-pay | Admitting: Family Medicine

## 2022-06-27 DIAGNOSIS — M546 Pain in thoracic spine: Secondary | ICD-10-CM | POA: Diagnosis not present

## 2022-06-27 DIAGNOSIS — S22080A Wedge compression fracture of T11-T12 vertebra, initial encounter for closed fracture: Secondary | ICD-10-CM | POA: Diagnosis not present

## 2022-06-27 DIAGNOSIS — S22070A Wedge compression fracture of T9-T10 vertebra, initial encounter for closed fracture: Secondary | ICD-10-CM | POA: Diagnosis not present

## 2022-06-27 DIAGNOSIS — C9000 Multiple myeloma not having achieved remission: Secondary | ICD-10-CM | POA: Diagnosis not present

## 2022-06-27 NOTE — Progress Notes (Signed)
Established Patient Office Visit  Subjective    Patient ID: [name], male    DOB: 06/21/1970  Age: 52 y.o. MRN: [id number]  CC: No chief complaint on file.   HPI [name] presents for routine follow up of chronic med issues. Patient denies acute complaints.    Outpatient Encounter Medications as of 06/25/2022  Medication Sig   acetaminophen (TYLENOL) 500 MG tablet Take 1 tablet (500 mg total) by mouth every 8 (eight) hours as needed for mild pain or moderate pain.   acyclovir (ZOVIRAX) 400 MG tablet Take 1 tablet (400 mg total) by mouth 2 (two) times daily.   allopurinol (ZYLOPRIM) 300 MG tablet Take 1 tablet (300 mg total) by mouth daily.   aspirin EC 81 MG tablet Take 81 mg by mouth daily. Swallow whole.   clonazePAM (KLONOPIN) 0.5 MG tablet Take 1 tablet (0.5 mg total) by mouth 2 (two) times daily as needed for anxiety.   diazepam (VALIUM) 5 MG tablet SMARTSIG:1-2 Tablet(s) By Mouth   docusate sodium (COLACE) 100 MG capsule Take 1 capsule (100 mg total) by mouth 2 (two) times daily as needed for mild constipation.   DULoxetine (CYMBALTA) 20 MG capsule Take 1 capsule (20 mg total) by mouth daily.   gabapentin (NEURONTIN) 100 MG capsule Take 1 capsule (100 mg total) by mouth 2 (two) times daily.   hydrochlorothiazide (HYDRODIURIL) 25 MG tablet Take 25 mg by mouth daily.   lisinopril (ZESTRIL) 40 MG tablet Take 40 mg by mouth daily.   Methocarbamol 1000 MG TABS Take 1,000 mg by mouth every 6 (six) hours as needed.   ondansetron (ZOFRAN) 8 MG tablet Take 1 tablet (8 mg total) by mouth every 8 (eight) hours as needed for nausea or vomiting.   Oxycodone HCl 20 MG TABS Take 1 tablet (20 mg total) by mouth every 6 (six) hours as needed.   pantoprazole (PROTONIX) 40 MG tablet Take 1 tablet (40 mg total) by mouth 2 (two) times daily.   polyethylene glycol powder (GLYCOLAX/MIRALAX) 17 GM/SCOOP powder Take 1 capful (17 g) with water by mouth daily.   prochlorperazine  (COMPAZINE) 10 MG tablet Take 1 tablet (10 mg total) by mouth every 6 (six) hours as needed for nausea or vomiting.   REVLIMID 25 MG capsule Take 1 capsule (25 mg total) by mouth daily. [name] #   [id number]  Date Obtained 06/16/2022 Take one capsule daily x 14 days. None for following 7 days   rosuvastatin (CRESTOR) 10 MG tablet Take 10 mg by mouth daily.   senna (SENOKOT) 8.6 MG TABS tablet Take 1 tablet (8.6 mg total) by mouth in the morning and at bedtime.   [EXPIRED] bortezomib SQ (VELCADE) chemo injection (2.'5mg'$ /mL concentration) 3 mg    No facility-administered encounter medications on file as of 06/25/2022.    Past Medical History:  Diagnosis Date   Allergy    Anemia    Atypical chest pain    Cancer (HCC)    Multiple myeloma with normocytic anemia   GERD (gastroesophageal reflux disease)    Hepatic cyst    Benign by MRI   Hiatal hernia    History of colonic polyps    Hyperlipidemia    Hypertension    Rectal bleeding    Tobacco abuse     Past Surgical History:  Procedure Laterality Date   COLONOSCOPY  01/10/2009     RMR: Normal rectum, normal colon; repeat in 2015 due to [facility] of colon cancer  COLONOSCOPY N/A 01/09/2014   [id number] colonic polyps-removed as described   COLONOSCOPY N/A 09/18/2016   Procedure: COLONOSCOPY;  Surgeon: Daneil Dolin, MD;  [facility];  Service: Endoscopy;  Laterality: N/A;  730    ESOPHAGOGASTRODUODENOSCOPY  10/04/2007   RMR: Distal esophageal erosions consistent with erosive reflux esophagitis, patulous gastroesophageal junction status post passage of a  Maloney dilator, 39 [place].  Otherwise, unremarkable esophagus.  Hiatal hernia.  Otherwise normal stomach.  Bulbar erosion   ESOPHAGOGASTRODUODENOSCOPY N/A 01/09/2014   Erosive reflux esophagitis. Small hiatal hernia   HUMERUS IM NAIL Left 02/22/2022   Procedure: INTRAMEDULLARY (IM) NAIL HUMERAL;  Surgeon: Nicholes Stairs, MD;  Location: [facility];  Service: Orthopedics;   Laterality: Left;   I & D EXTREMITY Left 07/26/2018   Procedure: IRRIGATION AND DEBRIDEMENT EXTREMITY;  Surgeon: Dayna Barker, MD;  Location: [facility];  Service: Plastics;  Laterality: Left;   IR BONE TUMOR(S)RF ABLATION  02/05/2022   IR BONE TUMOR(S)RF ABLATION  02/24/2022   IR KYPHO EA ADDL LEVEL THORACIC OR LUMBAR  02/19/2022   IR KYPHO LUMBAR INC FX REDUCE BONE BX UNI/BIL CANNULATION INC/IMAGING  02/05/2022   IR KYPHO LUMBAR INC FX REDUCE BONE BX UNI/BIL CANNULATION INC/IMAGING  02/19/2022   PERCUTANEOUS PINNING Left 07/26/2018   Procedure: PERCUTANEOUS PINNING EXTREMITY;  Surgeon: Dayna Barker, MD;  Location: [facility];  Service: Plastics;  Laterality: Left;    Family History  Problem Relation Age of Onset   Heart disease Mother    Hypertension Mother    Diabetes Father    Hypertension Father    Colon cancer Sister        passed away from colon cancer, in her 94s   Heart attack Maternal Uncle    Prostate cancer Maternal Uncle    Diabetes Other    Pancreatic cancer Neg Hx    Rectal cancer Neg Hx    Esophageal cancer Neg Hx    Stomach cancer Neg Hx     Social History   Socioeconomic History   Marital status: Divorced    Spouse name: Not on file   Number of children: 2   Years of education: Not on file   Highest education level: Not on file  Occupational History   Occupation: Scientist, 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   Smoking status: Every Day    Packs/day: 0.25    Years: 10.00    Total pack years: 2.50    Types: Cigarettes   Smokeless tobacco: Never  Vaping Use   Vaping Use: Never used  Substance and Sexual Activity   Alcohol use: Yes    Alcohol/week: 0.0 standard drinks of alcohol    Comment: occasional/wine on the weekends   Drug use: No   Sexual activity: Not on file  Other Topics Concern   Not on file  Social History Narrative   Right handed   Lives alone one story home   Social Determinants of Health    Financial Resource Strain: Not on file  Food Insecurity: No Food Insecurity (03/08/2022)   Hunger Vital Sign    Worried About Running Out of Food in the Last Year: Never true    Ran Out of Food in the Last Year: Never true  Transportation Needs: No Transportation Needs (03/08/2022)   PRAPARE - Hydrologist (Medical): No    Lack of Transportation (Non-Medical): No  Physical Activity:  Not on file  Stress: Not on file  Social Connections: Not on file  Intimate Partner Violence: Not At Risk (03/08/2022)   Humiliation, Afraid, Rape, and Kick questionnaire    Fear of Current or Ex-Partner: No    Emotionally Abused: No    Physically Abused: No    Sexually Abused: No    Review of Systems  All other systems reviewed and are negative.       Objective    BP 105/68   Pulse 99   Temp 97.7 F (36.5 C) (Oral)   Resp 16   Wt 222 lb 12.8 oz (101.1 kg)   SpO2 96%   BMI 32.90 kg/m   Physical Exam Vitals and nursing note reviewed.  Constitutional:      General: He is not in acute distress. Cardiovascular:     Rate and Rhythm: Normal rate and regular rhythm.  Pulmonary:     Effort: Pulmonary effort is normal.     Breath sounds: Normal breath sounds.  Abdominal:     Palpations: Abdomen is soft.     Tenderness: There is no abdominal tenderness.  Neurological:     General: No focal deficit present.     Mental Status: He is alert and oriented to person, place, and time.         Assessment & Plan:   1. Essential hypertension Appears stable. continue  2. Anxiety and depression continue  3. GERD without esophagitis continue  4. Multiple myeloma not having achieved remission ([place]) Management as per consultant    No follow-ups on file.   Becky Sax, MD

## 2022-07-01 ENCOUNTER — Other Ambulatory Visit: Payer: Self-pay

## 2022-07-01 DIAGNOSIS — M25512 Pain in left shoulder: Secondary | ICD-10-CM | POA: Diagnosis not present

## 2022-07-01 MED ORDER — DOCUSATE SODIUM 100 MG PO CAPS: 100.0000 mg | ORAL_CAPSULE | Freq: Two times a day (BID) | ORAL | 3 refills | Status: AC | PRN

## 2022-07-01 MED ORDER — ONDANSETRON HCL 8 MG PO TABS: 8.0000 mg | ORAL_TABLET | Freq: Three times a day (TID) | ORAL | 3 refills | Status: DC | PRN

## 2022-07-01 NOTE — Telephone Encounter (Signed)
Pt called for refills, see new orders 

## 2022-07-02 ENCOUNTER — Other Ambulatory Visit: Payer: Self-pay

## 2022-07-02 ENCOUNTER — Inpatient Hospital Stay: Payer: BC Managed Care – PPO

## 2022-07-02 ENCOUNTER — Inpatient Hospital Stay: Payer: BC Managed Care – PPO | Attending: Physician Assistant

## 2022-07-02 ENCOUNTER — Inpatient Hospital Stay (HOSPITAL_BASED_OUTPATIENT_CLINIC_OR_DEPARTMENT_OTHER): Payer: BC Managed Care – PPO | Admitting: Hematology and Oncology

## 2022-07-02 ENCOUNTER — Encounter: Payer: Self-pay | Admitting: Hematology and Oncology

## 2022-07-02 VITALS — BP 116/76 | HR 78 | Temp 98.3°F | Resp 15 | Wt 226.2 lb

## 2022-07-02 DIAGNOSIS — F17210 Nicotine dependence, cigarettes, uncomplicated: Secondary | ICD-10-CM | POA: Diagnosis not present

## 2022-07-02 DIAGNOSIS — K449 Diaphragmatic hernia without obstruction or gangrene: Secondary | ICD-10-CM | POA: Diagnosis not present

## 2022-07-02 DIAGNOSIS — I10 Essential (primary) hypertension: Secondary | ICD-10-CM | POA: Insufficient documentation

## 2022-07-02 DIAGNOSIS — K219 Gastro-esophageal reflux disease without esophagitis: Secondary | ICD-10-CM | POA: Diagnosis not present

## 2022-07-02 DIAGNOSIS — Z79899 Other long term (current) drug therapy: Secondary | ICD-10-CM | POA: Diagnosis not present

## 2022-07-02 DIAGNOSIS — Z5112 Encounter for antineoplastic immunotherapy: Secondary | ICD-10-CM | POA: Insufficient documentation

## 2022-07-02 DIAGNOSIS — R110 Nausea: Secondary | ICD-10-CM | POA: Diagnosis not present

## 2022-07-02 DIAGNOSIS — Z7982 Long term (current) use of aspirin: Secondary | ICD-10-CM | POA: Diagnosis not present

## 2022-07-02 DIAGNOSIS — Z79624 Long term (current) use of inhibitors of nucleotide synthesis: Secondary | ICD-10-CM | POA: Insufficient documentation

## 2022-07-02 DIAGNOSIS — Z86010 Personal history of colonic polyps: Secondary | ICD-10-CM | POA: Diagnosis not present

## 2022-07-02 DIAGNOSIS — K5900 Constipation, unspecified: Secondary | ICD-10-CM | POA: Diagnosis not present

## 2022-07-02 DIAGNOSIS — C9000 Multiple myeloma not having achieved remission: Secondary | ICD-10-CM

## 2022-07-02 DIAGNOSIS — Z7961 Long term (current) use of immunomodulator: Secondary | ICD-10-CM | POA: Insufficient documentation

## 2022-07-02 DIAGNOSIS — E785 Hyperlipidemia, unspecified: Secondary | ICD-10-CM | POA: Diagnosis not present

## 2022-07-02 LAB — CBC WITH DIFFERENTIAL (CANCER CENTER ONLY)
Abs Immature Granulocytes: 0.03 10*3/uL (ref 0.00–0.07)
Basophils Absolute: 0 10*3/uL (ref 0.0–0.1)
Basophils Relative: 0 %
Eosinophils Absolute: 0 10*3/uL (ref 0.0–0.5)
Eosinophils Relative: 0 %
HCT: 35.8 % — ABNORMAL LOW (ref 39.0–52.0)
Hemoglobin: 11.8 g/dL — ABNORMAL LOW (ref 13.0–17.0)
Immature Granulocytes: 1 %
Lymphocytes Relative: 36 %
Lymphs Abs: 1.6 10*3/uL (ref 0.7–4.0)
MCH: 28.2 pg (ref 26.0–34.0)
MCHC: 33 g/dL (ref 30.0–36.0)
MCV: 85.6 fL (ref 80.0–100.0)
Monocytes Absolute: 0.3 10*3/uL (ref 0.1–1.0)
Monocytes Relative: 6 %
Neutro Abs: 2.5 10*3/uL (ref 1.7–7.7)
Neutrophils Relative %: 57 %
Platelet Count: 189 10*3/uL (ref 150–400)
RBC: 4.18 MIL/uL — ABNORMAL LOW (ref 4.22–5.81)
RDW: 14.6 % (ref 11.5–15.5)
WBC Count: 4.4 10*3/uL (ref 4.0–10.5)
nRBC: 0 % (ref 0.0–0.2)

## 2022-07-02 LAB — CMP (CANCER CENTER ONLY)
ALT: 11 U/L (ref 0–44)
AST: 11 U/L — ABNORMAL LOW (ref 15–41)
Albumin: 3.8 g/dL (ref 3.5–5.0)
Alkaline Phosphatase: 67 U/L (ref 38–126)
Anion gap: 6 (ref 5–15)
BUN: 10 mg/dL (ref 6–20)
CO2: 28 mmol/L (ref 22–32)
Calcium: 8.6 mg/dL — ABNORMAL LOW (ref 8.9–10.3)
Chloride: 100 mmol/L (ref 98–111)
Creatinine: 0.83 mg/dL (ref 0.61–1.24)
GFR, Estimated: >60 mL/min (ref >60)
Glucose, Bld: 100 mg/dL — ABNORMAL HIGH (ref 70–99)
Potassium: 3.8 mmol/L (ref 3.5–5.1)
Sodium: 134 mmol/L — ABNORMAL LOW (ref 135–145)
Total Bilirubin: 0.3 mg/dL (ref 0.3–1.2)
Total Protein: 6.7 g/dL (ref 6.5–8.1)

## 2022-07-02 MED ORDER — DEXAMETHASONE 4 MG PO TABS
40.0000 mg | ORAL_TABLET | Freq: Once | ORAL | Status: AC
  Administered 2022-07-02: 40 mg via ORAL
  Filled 2022-07-02: qty 10

## 2022-07-02 MED ORDER — BORTEZOMIB CHEMO SQ INJECTION 3.5 MG (2.5MG/ML)
1.3000 mg/m2 | Freq: Once | INTRAMUSCULAR | Status: AC
  Administered 2022-07-02: 3 mg via SUBCUTANEOUS
  Filled 2022-07-02: qty 1.2

## 2022-07-02 MED ORDER — PROCHLORPERAZINE MALEATE 10 MG PO TABS: 10.0000 mg | ORAL_TABLET | Freq: Four times a day (QID) | ORAL | 0 refills | Status: DC | PRN

## 2022-07-02 NOTE — Patient Instructions (Signed)
[facility]  Discharge Instructions: Thank you for choosing [facility] to provide your oncology and hematology care.   If you have a lab appointment with the Cancer Center, please go directly to the Cancer Center and check in at the registration area.   Wear comfortable clothing and clothing appropriate for easy access to any Portacath or PICC line.   We strive to give you quality time with your provider. You may need to reschedule your appointment if you arrive late (15 or more minutes).  Arriving late affects you and other patients whose appointments are after yours.  Also, if you miss three or more appointments without notifying the office, you may be dismissed from the clinic at the provider's discretion.      For prescription refill requests, have your pharmacy contact our office and allow 72 hours for refills to be completed.    Today you received the following chemotherapy and/or immunotherapy agents velcade      To help prevent nausea and vomiting after your treatment, we encourage you to take your nausea medication as directed.  BELOW ARE SYMPTOMS THAT SHOULD BE REPORTED IMMEDIATELY: *FEVER GREATER THAN 100.4 F (38 C) OR HIGHER *CHILLS OR SWEATING *NAUSEA AND VOMITING THAT IS NOT CONTROLLED WITH YOUR NAUSEA MEDICATION *UNUSUAL SHORTNESS OF BREATH *UNUSUAL BRUISING OR BLEEDING *URINARY PROBLEMS (pain or burning when urinating, or frequent urination) *BOWEL PROBLEMS (unusual diarrhea, constipation, pain near the anus) TENDERNESS IN MOUTH AND THROAT WITH OR WITHOUT PRESENCE OF ULCERS (sore throat, sores in mouth, or a toothache) UNUSUAL RASH, SWELLING OR PAIN  UNUSUAL VAGINAL DISCHARGE OR ITCHING   Items with * indicate a potential emergency and should be followed up as soon as possible or go to the Emergency Department if any problems should occur.  Please show the CHEMOTHERAPY ALERT CARD or IMMUNOTHERAPY ALERT CARD at check-in  to the Emergency Department and triage nurse.  Should you have questions after your visit or need to cancel or reschedule your appointment, please contact [facility]  Dept: [phone]  and follow the prompts.  Office hours are 8:00 a.m. to 4:30 p.m. Monday - Friday. Please note that voicemails left after 4:00 p.m. may not be returned until the following business day.  We are closed weekends and major holidays. You have access to a nurse at all times for urgent questions. Please call the main number to the clinic Dept: [phone] and follow the prompts.   For any non-urgent questions, you may also contact your provider using MyChart. We now offer e-Visits for anyone 18 and older to request care online for non-urgent symptoms. For details visit [email or web address].   Also download the MyChart app! Go to the app store, search "MyChart", open the app, select , and log in with your MyChart username and password.   

## 2022-07-02 NOTE — Progress Notes (Signed)
C/O nausea which he relates to chemo meds. Refill of compazine sent to pharmacy.

## 2022-07-02 NOTE — Progress Notes (Signed)
[place] [phone]   [phone]  PROGRESS NOTE  Patient Care Team: [name], MD as PCP - General (Family Medicine) Lorretta Harp, MD as PCP - Cardiology (Cardiology) Gala Romney Cristopher Estimable, MD as Attending Physician (Gastroenterology)  Hematological/Oncological History # IgG Kappa Multiple Myeloma, 1p32/1q21.  01/04/2022: MRI lumbar spine shows diffuse heterogeneous marrow signal with heterogeneous enhancement throughout the thoracolumbar spine.  Additionally, there is subacute-chronic L3 vertebral body compression fraction. 01/14/2022: establish care with Dr. Lorenso Courier  02/10/2022: bone marrow biopsy shows range of plasma cell involvement from 50% on the biopsy to 60% on aspirate smear slides and close to 90% on the clot section for an overall percentage estimated at approximately 80% overall. 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  02/26/2022: Cycle 1 of VRD therapy 04/01/2022: Cycle 2 of VRD therapy  04/23/2022: Cycle 3 of VRD therapy  05/14/2022: Cycle 4 of VRD therapy  06/04/2022: Underwent kyphoplasty so treatment was cancelled 06/11/2022: Resume Cycle 5 Day 8 of VRD therapy 06/25/2022: Cycle 6 Day 1 of VRD  Interval History:  [name] 52 y.o. male with medical history significant for newly diagnosed IgG kappa multiple myeloma who presents for a follow up visit. The patient's last visit was on 05/21/2022 at which time he continued on VRD therapy. In the interim, he underwent kyphoplasty last week.   On exam today [name] reports his injections are going well.  He is not having any trouble with redness, itching, or pain.  He is not having any numbness or tingling of his fingers and toes.  He also reports no pins and needle sensation.  He reports he is eating well and gaining weight but he "does not want to".  He is trying to change his eating habits.  He is trying to avoid all heavy lifting.  He  reports that he does continue to have pain mostly in his lower back.  He notes that he is taking his oxycodone about 2 with a 5 mg/day.  He otherwise is at his baseline level of health and willing and able to proceed with treatment at this time.  He denies fevers, chills, sweats, shortness of breath, chest pain or cough. He has no other complaints. A full 10 point ROS was otherwise negative.  MEDICAL HISTORY:  Past Medical History:  Diagnosis Date   Allergy    Anemia    Atypical chest pain    Cancer (HCC)    Multiple myeloma with normocytic anemia   GERD (gastroesophageal reflux disease)    Hepatic cyst    Benign by MRI   Hiatal hernia    History of colonic polyps    Hyperlipidemia    Hypertension    Rectal bleeding    Tobacco abuse     SURGICAL HISTORY: Past Surgical History:  Procedure Laterality Date   COLONOSCOPY  01/10/2009     RMR: Normal rectum, normal colon; repeat in 2015 due to [place] of colon cancer   COLONOSCOPY N/A 01/09/2014   [id number] colonic polyps-removed as described   COLONOSCOPY N/A 09/18/2016   Procedure: COLONOSCOPY;  Surgeon: Daneil Dolin, MD;  [facility];  Service: Endoscopy;  Laterality: N/A;  730    ESOPHAGOGASTRODUODENOSCOPY  10/04/2007   RMR: Distal esophageal erosions consistent with erosive reflux esophagitis, patulous gastroesophageal junction status post passage of a  Maloney dilator, 51 [place].  Otherwise, unremarkable esophagus.  Hiatal hernia.  Otherwise normal stomach.  Bulbar erosion   ESOPHAGOGASTRODUODENOSCOPY  N/A 01/09/2014   Erosive reflux esophagitis. Small hiatal hernia   HUMERUS IM NAIL Left 02/22/2022   Procedure: INTRAMEDULLARY (IM) NAIL HUMERAL;  Surgeon: Nicholes Stairs, MD;  Location: [facility];  Service: Orthopedics;  Laterality: Left;   I & D EXTREMITY Left 07/26/2018   Procedure: IRRIGATION AND DEBRIDEMENT EXTREMITY;  Surgeon: Dayna Barker, MD;  Location: [facility];  Service: Plastics;  Laterality: Left;   IR BONE  TUMOR(S)RF ABLATION  02/05/2022   IR BONE TUMOR(S)RF ABLATION  02/24/2022   IR KYPHO EA ADDL LEVEL THORACIC OR LUMBAR  02/19/2022   IR KYPHO LUMBAR INC FX REDUCE BONE BX UNI/BIL CANNULATION INC/IMAGING  02/05/2022   IR KYPHO LUMBAR INC FX REDUCE BONE BX UNI/BIL CANNULATION INC/IMAGING  02/19/2022   PERCUTANEOUS PINNING Left 07/26/2018   Procedure: PERCUTANEOUS PINNING EXTREMITY;  Surgeon: Dayna Barker, MD;  Location: [facility];  Service: Plastics;  Laterality: Left;    SOCIAL HISTORY: Social History   Socioeconomic History   Marital status: Divorced    Spouse name: Not on file   Number of children: 2   Years of education: Not on file   Highest education level: Not on file  Occupational History   Occupation: Scientist, 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   Smoking status: Every Day    Packs/day: 0.25    Years: 10.00    Total pack years: 2.50    Types: Cigarettes   Smokeless tobacco: Never  Vaping Use   Vaping Use: Never used  Substance and Sexual Activity   Alcohol use: Yes    Alcohol/week: 0.0 standard drinks of alcohol    Comment: occasional/wine on the weekends   Drug use: No   Sexual activity: Not on file  Other Topics Concern   Not on file  Social History Narrative   Right handed   Lives alone one story home   Social Determinants of Health   Financial Resource Strain: Not on file  Food Insecurity: No Food Insecurity (03/08/2022)   Hunger Vital Sign    Worried About Running Out of Food in the Last Year: Never true    Ran Out of Food in the Last Year: Never true  Transportation Needs: No Transportation Needs (03/08/2022)   PRAPARE - Hydrologist (Medical): No    Lack of Transportation (Non-Medical): No  Physical Activity: Not on file  Stress: Not on file  Social Connections: Not on file  Intimate Partner Violence: Not At Risk (03/08/2022)   Humiliation, Afraid, Rape, and Kick  questionnaire    Fear of Current or Ex-Partner: No    Emotionally Abused: No    Physically Abused: No    Sexually Abused: No    FAMILY HISTORY: Family History  Problem Relation Age of Onset   Heart disease Mother    Hypertension Mother    Diabetes Father    Hypertension Father    Colon cancer Sister        passed away from colon cancer, in her 20s   Heart attack Maternal Uncle    Prostate cancer Maternal Uncle    Diabetes Other    Pancreatic cancer Neg Hx    Rectal cancer Neg Hx    Esophageal cancer Neg Hx    Stomach cancer Neg Hx     ALLERGIES:  has No Known Allergies.  MEDICATIONS:  Current Outpatient Medications  Medication Sig Dispense Refill   acetaminophen (TYLENOL)  500 MG tablet Take 1 tablet (500 mg total) by mouth every 8 (eight) hours as needed for mild pain or moderate pain. 30 tablet 0   acyclovir (ZOVIRAX) 400 MG tablet Take 1 tablet (400 mg total) by mouth 2 (two) times daily. 60 tablet 3   aspirin EC 81 MG tablet Take 81 mg by mouth daily. Swallow whole.     clonazePAM (KLONOPIN) 0.5 MG tablet Take 1 tablet (0.5 mg total) by mouth 2 (two) times daily as needed for anxiety. 15 tablet 0   diazepam (VALIUM) 5 MG tablet SMARTSIG:1-2 Tablet(s) By Mouth     docusate sodium (COLACE) 100 MG capsule Take 1 capsule (100 mg total) by mouth 2 (two) times daily as needed for mild constipation. 30 capsule 3   DULoxetine (CYMBALTA) 20 MG capsule Take 1 capsule (20 mg total) by mouth daily. 30 capsule 1   gabapentin (NEURONTIN) 100 MG capsule Take 1 capsule (100 mg total) by mouth 2 (two) times daily. 60 capsule 1   hydrochlorothiazide (HYDRODIURIL) 25 MG tablet Take 25 mg by mouth daily.     lisinopril (ZESTRIL) 40 MG tablet Take 40 mg by mouth daily.     Methocarbamol 1000 MG TABS Take 1,000 mg by mouth every 6 (six) hours as needed. 45 tablet 1   ondansetron (ZOFRAN) 8 MG tablet Take 1 tablet (8 mg total) by mouth every 8 (eight) hours as needed for nausea or vomiting. 60  tablet 3   Oxycodone HCl 20 MG TABS Take 1 tablet (20 mg total) by mouth every 6 (six) hours as needed. 60 tablet 0   pantoprazole (PROTONIX) 40 MG tablet Take 1 tablet (40 mg total) by mouth 2 (two) times daily. 60 tablet 2   polyethylene glycol powder (GLYCOLAX/MIRALAX) 17 GM/SCOOP powder Take 1 capful (17 g) with water by mouth daily. 238 g 0   prochlorperazine (COMPAZINE) 10 MG tablet Take 1 tablet (10 mg total) by mouth every 6 (six) hours as needed for nausea or vomiting. 60 tablet 0   rosuvastatin (CRESTOR) 10 MG tablet Take 10 mg by mouth daily.     senna (SENOKOT) 8.6 MG TABS tablet Take 1 tablet (8.6 mg total) by mouth in the morning and at bedtime. 120 tablet 3   allopurinol (ZYLOPRIM) 300 MG tablet Take 1 tablet (300 mg total) by mouth daily. 30 tablet 3   REVLIMID 25 MG capsule Take 1 capsule (25 mg total) by mouth daily. Take one capsule daily x 14 days. None for following 7 days 14 capsule 0   No current facility-administered medications for this visit.    REVIEW OF SYSTEMS:   Constitutional: ( - ) fevers, ( - )  chills , ( - ) night sweats Eyes: ( - ) blurriness of vision, ( - ) double vision, ( - ) watery eyes Ears, nose, mouth, throat, and face: ( - ) mucositis, ( - ) sore throat Respiratory: ( - ) cough, ( - ) dyspnea, ( - ) wheezes Cardiovascular: ( - ) palpitation, ( - ) chest discomfort, ( - ) lower extremity swelling Gastrointestinal:  ( - ) nausea, ( - ) heartburn, ( + ) change in bowel habits Skin: ( - ) abnormal skin rashes Lymphatics: ( - ) new lymphadenopathy, ( - ) easy bruising Neurological: ( - ) numbness, ( - ) tingling, ( - ) new weaknesses Behavioral/Psych: ( - ) mood change, ( - ) new changes  All other systems were reviewed with the patient  and are negative.  PHYSICAL EXAMINATION: ECOG PERFORMANCE STATUS: 1 - Symptomatic but completely ambulatory  Vitals:   07/02/22 1124  BP: 116/76  Pulse: 78  Resp: 15  Temp: 98.3 F (36.8 C)  SpO2: 96%    Filed Weights   07/02/22 1124  Weight: 226 lb 3.2 oz (102.6 kg)    GENERAL: Well-appearing young African-American male, alert, no distress and comfortable SKIN: skin color, texture, turgor are normal, no rashes or significant lesions EYES: conjunctiva are pink and non-injected, sclera clear LUNGS: clear to auscultation and percussion with normal breathing effort HEART: regular rate & rhythm and no murmurs and no lower extremity edema Musculoskeletal: no cyanosis of digits and no clubbing  PSYCH: alert & oriented x 3, fluent speech NEURO: no focal motor/sensory deficits  LABORATORY DATA:  I have reviewed the data as listed    Latest Ref Rng & Units 07/09/2022   10:05 AM 07/02/2022   10:19 AM 06/25/2022    7:53 AM  CBC  WBC 4.0 - 10.5 K/uL 4.7  4.4  6.2   Hemoglobin 13.0 - 17.0 g/dL 12.2  11.8  11.5   Hematocrit 39.0 - 52.0 % 36.8  35.8  35.3   Platelets 150 - 400 K/uL 182  189  193        Latest Ref Rng & Units 07/09/2022   10:05 AM 07/02/2022   10:19 AM 06/25/2022    7:53 AM  CMP  Glucose 70 - 99 mg/dL 108  100  121   BUN 6 - 20 mg/dL 12  10  14   $ Creatinine 0.61 - 1.24 mg/dL 0.89  0.83  0.88   Sodium 135 - 145 mmol/L 132  134  134   Potassium 3.5 - 5.1 mmol/L 4.2  3.8  4.2   Chloride 98 - 111 mmol/L 100  100  102   CO2 22 - 32 mmol/L 28  28  25   $ Calcium 8.9 - 10.3 mg/dL 8.6  8.6  8.6   Total Protein 6.5 - 8.1 g/dL 7.2  6.7  7.3   Total Bilirubin 0.3 - 1.2 mg/dL 0.5  0.3  0.3   Alkaline Phos 38 - 126 U/L 70  67  72   AST 15 - 41 U/L 12  11  12   $ ALT 0 - 44 U/L 13  11  14     $ Lab Results  Component Value Date   MPROTEIN 0.8 (H) 06/18/2022   MPROTEIN 0.8 (H) 05/13/2022   MPROTEIN 1.3 (H) 04/23/2022   Lab Results  Component Value Date   KPAFRELGTCHN 24.0 (H) 06/25/2022   KPAFRELGTCHN 39.3 (H) 05/13/2022   KPAFRELGTCHN 44.2 (H) 04/23/2022   LAMBDASER 9.9 06/25/2022   LAMBDASER 13.4 05/13/2022   LAMBDASER 11.1 04/23/2022   KAPLAMBRATIO 2.42 (H) 06/25/2022    KAPLAMBRATIO 2.93 (H) 05/13/2022   KAPLAMBRATIO 3.98 (H) 04/23/2022   RADIOGRAPHIC STUDIES: MR THORACIC SPINE WO CONTRAST  Result Date: 06/27/2022 CLINICAL DATA:  Low back and bilateral leg pain. History of multiple myeloma. EXAM: MRI THORACIC SPINE WITHOUT CONTRAST TECHNIQUE: Multiplanar, multisequence MR imaging of the thoracic spine was performed. No intravenous contrast was administered. COMPARISON:  Lumbar MRI 05/30/2022.  Thoracic MRI 03/07/2022. FINDINGS: Despite efforts by the technologist and patient, mild motion artifact is present on today's exam and could not be eliminated. This reduces exam sensitivity and specificity. Alignment:  Physiologic. Vertebrae: Widespread heterogeneity of the marrow is again noted throughout the cervicothoracic spine consistent with multiple myeloma. No acute  fractures are identified. Subacute fractures at T10 and T12 have not significantly progressed from the recent lumbar MRI. There are chronic fractures at T11 and L1 which are grossly stable. Previous spinal augmentation at L1 and L2. Cord:  The thoracic cord appears normal in signal and caliber. Paraspinal and other soft tissues: No significant paraspinal abnormalities. Disc levels: Multilevel spondylosis with disc bulging and scattered small thoracic disc protrusions, similar to previous study. No cord deformity or high-grade foraminal narrowing identified. IMPRESSION: 1. No acute findings or clear explanation for the patient's symptoms. 2. Subacute fractures at T10 and T12 have not significantly progressed from the recent lumbar MRI. Chronic fractures at T11 and L1 are grossly stable. 3. Widespread marrow heterogeneity consistent with multiple myeloma. 4. Stable spondylosis without cord deformity or high-grade foraminal narrowing. Electronically Signed   By: Richardean Sale M.D.   On: 06/27/2022 10:46    ASSESSMENT & PLAN SEDERICK LASTINGER 52 y.o. male with medical history significant for IgG kappa multiple myeloma  who presents for a follow up visit.  After review of the labs, review the records, discussion with the patient the findings are most consistent with newly diagnosed multiple myeloma.  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  At this time would recommend proceeding with VRD chemotherapy with the intention of referral for ASCT when his labs are appropriate.  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 -- Recommend VRD chemotherapy with intention of proceeding to transplant. -- Labs at each visit to include CBC, CMP, LDH with monthly restaging labs SPEP and serum free light chains -- Started VRD therapy on 02/26/2022.  PLAN: --Presents today to start Cycle 6, Day 8 of VRD therapy --Labs from today were reviewed and adequate for treatment. WBC 4.4, 11.8, MCV 85.6, and platelets of 189 --Proceed with treatment today as planned without any dose modifications --Continue with weekly treatments and follow up in clinic in 2 weeks.   #Constipation: --Recommend to continue on colace but add miralax as well.   #Pathologic fractures-secondary to MM: --Involving L1, L2 and L3 compression fracture and left humerus fracture.  --Underwent kyphoplasty of L3 fracture on 02/19/2022 --Underwent medullary nailing of left humeral shaft on 02/22/2022 --Underwent kyphoplasty on 06/04/2022.   #Pain Medication -- Per palliative care continue on MS contin 60 mg q 8 hours, oxycodone 20 mg q 6 hours, change robaxin 1000 mg QID.  --Appreciate assistance of palliative care NP for further management of pain and other chronic symptoms.   #Supportive Care -- chemotherapy education complete -- port placement not required -- zofran 43m q8H PRN and compazine 158mPO q6H for nausea -- acyclovir 40085mO BID for VCZ prophylaxis --  allopurinol 300m66m daily for TLS prophylaxis -- Dental clearance for Zometa/Denosumab approved. Last dose of denosumab 06/18/2021. Continue monthly.   Orders Placed This Encounter  Procedures   Multiple Myeloma Panel (SPEP&IFE w/QIG)    Standing Status:   Future    Standing Expiration Date:   07/16/2023   Kappa/lambda light chains    Standing Status:   Future    Standing Expiration Date:   07/16/2023   CBC with Differential ([facility])    Standing Status:   Future    Standing Expiration Date:   07/17/2023   CMP ([facility])  Standing Status:   Future    Standing Expiration Date:   07/17/2023   CBC with Differential ([facility] Only)    Standing Status:   Future    Standing Expiration Date:   07/24/2023   CMP ([facility] only)    Standing Status:   Future    Standing Expiration Date:   07/24/2023   CBC with Differential ([facility] Only)    Standing Status:   Future    Standing Expiration Date:   07/31/2023   CMP ([facility] only)    Standing Status:   Future    Standing Expiration Date:   07/31/2023   Multiple Myeloma Panel (SPEP&IFE w/QIG)    Standing Status:   Future    Standing Expiration Date:   08/06/2023   Kappa/lambda light chains    Standing Status:   Future    Standing Expiration Date:   08/06/2023   CBC with Differential ([facility] Only)    Standing Status:   Future    Standing Expiration Date:   08/07/2023   CMP ([facility] only)    Standing Status:   Future    Standing Expiration Date:   08/07/2023   CBC with Differential ([facility] Only)    Standing Status:   Future    Standing Expiration Date:   08/14/2023   CMP ([facility] only)    Standing Status:   Future    Standing Expiration Date:   08/14/2023   CBC with Differential ([facility] Only)    Standing Status:   Future    Standing Expiration Date:   08/21/2023   CMP ([facility] only)    Standing Status:   Future    Standing Expiration Date:   08/21/2023   Multiple  Myeloma Panel (SPEP&IFE w/QIG)    Standing Status:   Future    Standing Expiration Date:   08/27/2023   Kappa/lambda light chains    Standing Status:   Future    Standing Expiration Date:   08/27/2023   CBC with Differential ([facility] Only)    Standing Status:   Future    Standing Expiration Date:   08/28/2023   CMP ([facility] [facility] only)    Standing Status:   Future    Standing Expiration Date:   08/28/2023   CBC with Differential ([facility] Only)    Standing Status:   Future    Standing Expiration Date:   09/04/2023   CMP ([facility] only)    Standing Status:   Future    Standing Expiration Date:   09/04/2023   CBC with Differential ([facility] [place] Only)    Standing Status:   Future    Standing Expiration Date:   09/11/2023   CMP ([facility] only)    Standing Status:   Future    Standing Expiration Date:   09/11/2023   Multiple Myeloma Panel (SPEP&IFE w/QIG)    Standing Status:   Future    Standing Expiration Date:   09/17/2023   Kappa/lambda light chains    Standing Status:   Future    Standing Expiration Date:   09/17/2023   CBC with Differential ([facility] Only)    Standing Status:   Future    Standing Expiration Date:   09/18/2023   CMP ([facility] only)    Standing Status:   Future    Standing Expiration Date:   09/18/2023   CBC with Differential ([facility] Only)    Standing Status:  Future    Standing Expiration Date:   09/25/2023   CMP ([place] only)    Standing Status:   Future    Standing Expiration Date:   09/25/2023   CBC with Differential ([place] Only)    Standing Status:   Future    Standing Expiration Date:   10/02/2023   CMP ([facility] only)    Standing Status:   Future    Standing Expiration Date:   10/02/2023    All questions were answered. The patient knows to call the clinic with any problems, questions or concerns.  I have spent a total of 30 minutes minutes of face-to-face and non-face-to-face time, preparing to see the  [facility] a medically appropriate examination, counseling and educating the patient, ordering medications/tests, documenting clinical information in the electronic health record,  and care coordination.   Ledell Peoples, MD Department of Hematology/Oncology [place] at [facility] Phone: [phone] Pager: [phone] Email: [email or web address]  07/13/2022 4:49 PM

## 2022-07-03 DIAGNOSIS — S22070A Wedge compression fracture of T9-T10 vertebra, initial encounter for closed fracture: Secondary | ICD-10-CM | POA: Diagnosis not present

## 2022-07-03 DIAGNOSIS — M8008XA Age-related osteoporosis with current pathological fracture, vertebra(e), initial encounter for fracture: Secondary | ICD-10-CM | POA: Diagnosis not present

## 2022-07-03 DIAGNOSIS — S22080A Wedge compression fracture of T11-T12 vertebra, initial encounter for closed fracture: Secondary | ICD-10-CM | POA: Diagnosis not present

## 2022-07-04 DIAGNOSIS — R2689 Other abnormalities of gait and mobility: Secondary | ICD-10-CM | POA: Diagnosis not present

## 2022-07-04 DIAGNOSIS — M6281 Muscle weakness (generalized): Secondary | ICD-10-CM | POA: Diagnosis not present

## 2022-07-04 DIAGNOSIS — C9000 Multiple myeloma not having achieved remission: Secondary | ICD-10-CM | POA: Diagnosis not present

## 2022-07-04 DIAGNOSIS — M4802 Spinal stenosis, cervical region: Secondary | ICD-10-CM | POA: Diagnosis not present

## 2022-07-09 ENCOUNTER — Other Ambulatory Visit: Payer: Self-pay | Admitting: *Deleted

## 2022-07-09 ENCOUNTER — Encounter: Payer: Self-pay | Admitting: Nurse Practitioner

## 2022-07-09 ENCOUNTER — Other Ambulatory Visit: Payer: Self-pay

## 2022-07-09 ENCOUNTER — Inpatient Hospital Stay: Payer: BC Managed Care – PPO

## 2022-07-09 ENCOUNTER — Inpatient Hospital Stay (HOSPITAL_BASED_OUTPATIENT_CLINIC_OR_DEPARTMENT_OTHER): Payer: BC Managed Care – PPO | Admitting: Nurse Practitioner

## 2022-07-09 ENCOUNTER — Inpatient Hospital Stay: Payer: BC Managed Care – PPO | Admitting: Hematology and Oncology

## 2022-07-09 VITALS — BP 128/76 | HR 73 | Temp 98.0°F | Resp 18 | Wt 226.8 lb

## 2022-07-09 DIAGNOSIS — G893 Neoplasm related pain (acute) (chronic): Secondary | ICD-10-CM | POA: Diagnosis not present

## 2022-07-09 DIAGNOSIS — Z79624 Long term (current) use of inhibitors of nucleotide synthesis: Secondary | ICD-10-CM | POA: Diagnosis not present

## 2022-07-09 DIAGNOSIS — K219 Gastro-esophageal reflux disease without esophagitis: Secondary | ICD-10-CM | POA: Diagnosis not present

## 2022-07-09 DIAGNOSIS — Z86010 Personal history of colonic polyps: Secondary | ICD-10-CM | POA: Diagnosis not present

## 2022-07-09 DIAGNOSIS — Z515 Encounter for palliative care: Secondary | ICD-10-CM | POA: Diagnosis not present

## 2022-07-09 DIAGNOSIS — I10 Essential (primary) hypertension: Secondary | ICD-10-CM | POA: Diagnosis not present

## 2022-07-09 DIAGNOSIS — E785 Hyperlipidemia, unspecified: Secondary | ICD-10-CM | POA: Diagnosis not present

## 2022-07-09 DIAGNOSIS — Z7961 Long term (current) use of immunomodulator: Secondary | ICD-10-CM | POA: Diagnosis not present

## 2022-07-09 DIAGNOSIS — C9000 Multiple myeloma not having achieved remission: Secondary | ICD-10-CM

## 2022-07-09 DIAGNOSIS — R110 Nausea: Secondary | ICD-10-CM | POA: Diagnosis not present

## 2022-07-09 DIAGNOSIS — Z7982 Long term (current) use of aspirin: Secondary | ICD-10-CM | POA: Diagnosis not present

## 2022-07-09 DIAGNOSIS — Z5112 Encounter for antineoplastic immunotherapy: Secondary | ICD-10-CM | POA: Diagnosis not present

## 2022-07-09 DIAGNOSIS — K5900 Constipation, unspecified: Secondary | ICD-10-CM | POA: Diagnosis not present

## 2022-07-09 DIAGNOSIS — K449 Diaphragmatic hernia without obstruction or gangrene: Secondary | ICD-10-CM | POA: Diagnosis not present

## 2022-07-09 DIAGNOSIS — F17210 Nicotine dependence, cigarettes, uncomplicated: Secondary | ICD-10-CM | POA: Diagnosis not present

## 2022-07-09 DIAGNOSIS — Z79899 Other long term (current) drug therapy: Secondary | ICD-10-CM | POA: Diagnosis not present

## 2022-07-09 LAB — CMP (CANCER CENTER ONLY)
ALT: 13 U/L (ref 0–44)
AST: 12 U/L — ABNORMAL LOW (ref 15–41)
Albumin: 4.1 g/dL (ref 3.5–5.0)
Alkaline Phosphatase: 70 U/L (ref 38–126)
Anion gap: 4 — ABNORMAL LOW (ref 5–15)
BUN: 12 mg/dL (ref 6–20)
CO2: 28 mmol/L (ref 22–32)
Calcium: 8.6 mg/dL — ABNORMAL LOW (ref 8.9–10.3)
Chloride: 100 mmol/L (ref 98–111)
Creatinine: 0.89 mg/dL (ref 0.61–1.24)
GFR, Estimated: >60 mL/min (ref >60)
Glucose, Bld: 108 mg/dL — ABNORMAL HIGH (ref 70–99)
Potassium: 4.2 mmol/L (ref 3.5–5.1)
Sodium: 132 mmol/L — ABNORMAL LOW (ref 135–145)
Total Bilirubin: 0.5 mg/dL (ref 0.3–1.2)
Total Protein: 7.2 g/dL (ref 6.5–8.1)

## 2022-07-09 LAB — CBC WITH DIFFERENTIAL (CANCER CENTER ONLY)
Abs Immature Granulocytes: 0.02 10*3/uL (ref 0.00–0.07)
Basophils Absolute: 0 10*3/uL (ref 0.0–0.1)
Basophils Relative: 0 %
Eosinophils Absolute: 0 10*3/uL (ref 0.0–0.5)
Eosinophils Relative: 0 %
HCT: 36.8 % — ABNORMAL LOW (ref 39.0–52.0)
Hemoglobin: 12.2 g/dL — ABNORMAL LOW (ref 13.0–17.0)
Immature Granulocytes: 0 %
Lymphocytes Relative: 34 %
Lymphs Abs: 1.6 10*3/uL (ref 0.7–4.0)
MCH: 28.6 pg (ref 26.0–34.0)
MCHC: 33.2 g/dL (ref 30.0–36.0)
MCV: 86.2 fL (ref 80.0–100.0)
Monocytes Absolute: 0.5 10*3/uL (ref 0.1–1.0)
Monocytes Relative: 11 %
Neutro Abs: 2.5 10*3/uL (ref 1.7–7.7)
Neutrophils Relative %: 55 %
Platelet Count: 182 10*3/uL (ref 150–400)
RBC: 4.27 MIL/uL (ref 4.22–5.81)
RDW: 14.3 % (ref 11.5–15.5)
WBC Count: 4.7 10*3/uL (ref 4.0–10.5)
nRBC: 0 % (ref 0.0–0.2)

## 2022-07-09 MED ORDER — BORTEZOMIB CHEMO SQ INJECTION 3.5 MG (2.5MG/ML)
1.3000 mg/m2 | Freq: Once | INTRAMUSCULAR | Status: AC
  Administered 2022-07-09: 3 mg via SUBCUTANEOUS
  Filled 2022-07-09: qty 1.2

## 2022-07-09 MED ORDER — DEXAMETHASONE 4 MG PO TABS
40.0000 mg | ORAL_TABLET | Freq: Once | ORAL | Status: AC
  Administered 2022-07-09: 40 mg via ORAL
  Filled 2022-07-09: qty 10

## 2022-07-09 MED ORDER — REVLIMID 25 MG PO CAPS: 25.0000 mg | ORAL_CAPSULE | Freq: Every day | ORAL | 0 refills | Status: DC

## 2022-07-09 MED ORDER — ALLOPURINOL 300 MG PO TABS: 300.0000 mg | ORAL_TABLET | Freq: Every day | ORAL | 3 refills | Status: DC

## 2022-07-09 NOTE — Patient Instructions (Signed)
[facility]  Discharge Instructions: Thank you for choosing [facility] to provide your oncology and hematology care.   If you have a lab appointment with the [facility], please go directly to the [facility] and check in at the registration area.   Wear comfortable clothing and clothing appropriate for easy access to any Portacath or PICC line.   We strive to give you quality time with your provider. You may need to reschedule your appointment if you arrive late (15 or more minutes).  Arriving late affects you and other patients whose appointments are after yours.  Also, if you miss three or more appointments without notifying the office, you may be dismissed from the clinic at the provider's discretion.      For prescription refill requests, have your pharmacy contact our office and allow 72 hours for refills to be completed.    Today you received the following chemotherapy and/or immunotherapy agents: Velcade.       To help prevent nausea and vomiting after your treatment, we encourage you to take your nausea medication as directed.  BELOW ARE SYMPTOMS THAT SHOULD BE REPORTED IMMEDIATELY: *FEVER GREATER THAN 100.4 F (38 C) OR HIGHER *CHILLS OR SWEATING *NAUSEA AND VOMITING THAT IS NOT CONTROLLED WITH YOUR NAUSEA MEDICATION *UNUSUAL SHORTNESS OF BREATH *UNUSUAL BRUISING OR BLEEDING *URINARY PROBLEMS (pain or burning when urinating, or frequent urination) *BOWEL PROBLEMS (unusual diarrhea, constipation, pain near the anus) TENDERNESS IN MOUTH AND THROAT WITH OR WITHOUT PRESENCE OF ULCERS (sore throat, sores in mouth, or a toothache) UNUSUAL RASH, SWELLING OR PAIN  UNUSUAL VAGINAL DISCHARGE OR ITCHING   Items with * indicate a potential emergency and should be followed up as soon as possible or go to the Emergency Department if any problems should occur.  Please show the CHEMOTHERAPY ALERT CARD or IMMUNOTHERAPY ALERT CARD at  check-in to the Emergency Department and triage nurse.  Should you have questions after your visit or need to cancel or reschedule your appointment, please contact [facility]  Dept: [phone]  and follow the prompts.  Office hours are 8:00 a.m. to 4:30 p.m. Monday - Friday. Please note that voicemails left after 4:00 p.m. may not be returned until the following business day.  We are closed weekends and major holidays. You have access to a nurse at all times for urgent questions. Please call the main number to the clinic Dept: [phone] and follow the prompts.   For any non-urgent questions, you may also contact your provider using MyChart. We now offer e-Visits for anyone 17 and older to request care online for non-urgent symptoms. For details visit [email or web address].   Also download the MyChart app! Go to the app store, search "MyChart", open the app, select [facility], and log in with your MyChart username and password.

## 2022-07-09 NOTE — Progress Notes (Signed)
[place]  [phone] [phone]   Name: [name] Date: 07/09/2022 MRN: [id number]  DOB: 10-12-1970  Patient Care Team: [name], MD as PCP - General (Family Medicine) Lorretta Harp, MD as PCP - Cardiology (Cardiology) Gala Romney Cristopher Estimable, MD as Attending Physician (Gastroenterology)     INTERVAL HISTORY: [name] is a 52 y.o. male with oncologic medical history including IgG Kappa Multiple Myeloma, L1, L2, and L3 vertebral compression fraction s/p kyphoplasty(9/27) and IM nailing of left humeral shaft (9/30), currently undergoing VRD therapy.  Palliative ask to see for symptom management.   SOCIAL HISTORY:     reports that he has been smoking cigarettes. He has a 2.50 pack-year smoking history. He has never used smokeless tobacco. He reports current alcohol use. He reports that he does not use drugs.  ADVANCE DIRECTIVES:    CODE STATUS:   PAST MEDICAL HISTORY: Past Medical History:  Diagnosis Date   Allergy    Anemia    Atypical chest pain    Cancer (HCC)    Multiple myeloma with normocytic anemia   GERD (gastroesophageal reflux disease)    Hepatic cyst    Benign by MRI   Hiatal hernia    History of colonic polyps    Hyperlipidemia    Hypertension    Rectal bleeding    Tobacco abuse     ALLERGIES:  has No Known Allergies.  MEDICATIONS:  Current Outpatient Medications  Medication Sig Dispense Refill   acetaminophen (TYLENOL) 500 MG tablet Take 1 tablet (500 mg total) by mouth every 8 (eight) hours as needed for mild pain or moderate pain. 30 tablet 0   acyclovir (ZOVIRAX) 400 MG tablet Take 1 tablet (400 mg total) by mouth 2 (two) times daily. 60 tablet 3   allopurinol (ZYLOPRIM) 300 MG tablet Take 1 tablet (300 mg total) by mouth daily. 30 tablet 3   aspirin EC 81 MG tablet Take 81 mg by mouth daily. Swallow whole.     clonazePAM (KLONOPIN) 0.5 MG tablet Take 1 tablet (0.5 mg  total) by mouth 2 (two) times daily as needed for anxiety. 15 tablet 0   diazepam (VALIUM) 5 MG tablet SMARTSIG:1-2 Tablet(s) By Mouth     docusate sodium (COLACE) 100 MG capsule Take 1 capsule (100 mg total) by mouth 2 (two) times daily as needed for mild constipation. 30 capsule 3   DULoxetine (CYMBALTA) 20 MG capsule Take 1 capsule (20 mg total) by mouth daily. 30 capsule 1   gabapentin (NEURONTIN) 100 MG capsule Take 1 capsule (100 mg total) by mouth 2 (two) times daily. 60 capsule 1   hydrochlorothiazide (HYDRODIURIL) 25 MG tablet Take 25 mg by mouth daily.     lisinopril (ZESTRIL) 40 MG tablet Take 40 mg by mouth daily.     Methocarbamol 1000 MG TABS Take 1,000 mg by mouth every 6 (six) hours as needed. 45 tablet 1   ondansetron (ZOFRAN) 8 MG tablet Take 1 tablet (8 mg total) by mouth every 8 (eight) hours as needed for nausea or vomiting. 60 tablet 3   Oxycodone HCl 20 MG TABS Take 1 tablet (20 mg total) by mouth every 6 (six) hours as needed. 60 tablet 0   pantoprazole (PROTONIX) 40 MG tablet Take 1 tablet (40 mg total) by mouth 2 (two) times daily. 60 tablet 2   polyethylene glycol powder (GLYCOLAX/MIRALAX) 17 GM/SCOOP powder Take 1 capful (17 g) with water by  mouth daily. 238 g 0   prochlorperazine (COMPAZINE) 10 MG tablet Take 1 tablet (10 mg total) by mouth every 6 (six) hours as needed for nausea or vomiting. 60 tablet 0   REVLIMID 25 MG capsule Take 1 capsule (25 mg total) by mouth daily. [name] #   [id number]  Date Obtained 06/16/2022 Take one capsule daily x 14 days. None for following 7 days 14 capsule 0   rosuvastatin (CRESTOR) 10 MG tablet Take 10 mg by mouth daily.     senna (SENOKOT) 8.6 MG TABS tablet Take 1 tablet (8.6 mg total) by mouth in the morning and at bedtime. 120 tablet 3   No current facility-administered medications for this visit.    VITAL SIGNS: There were no vitals taken for this visit. There were no vitals filed for this visit.  Estimated body mass  index is 33.4 kg/m as calculated from the following:   Height as of 06/18/22: 5' 9"$  (1.753 m).   Weight as of 07/02/22: 226 lb 3.2 oz (102.6 kg).   PERFORMANCE STATUS (ECOG) : 1 - Symptomatic but completely ambulatory   IMPRESSION: I saw Zuno during infusion today.  No acute distress noted.  Continues to do well overall.  Is remaining as active as possible.  Is able to ambulate without assistive devices however does continue to use cane for extra support and precautionary.  Denies nausea, vomiting, constipation, or diarrhea.  Is appreciative of how well he is feeling and has improved.    Neoplasm related pain Damontay reports his pain is much improved.  We have been able to wean off of MS Contin. He has oxycodone 20 mg as needed.  Has not required in over 2 weeks.  Continues with Gabapentin 100 mg twice daily, Cymbalta 20 mg, and robaxin as needed.  Reports he had a recent visit with his spine doctor who suggested the bulk of his discomfort is mostly muscular.  He does use his Robaxin as needed with relief.  Given controlled pain no changes at this time.  Will continue to closely monitor.  Nausea/Indigestion Resolved  Constipation Resolved with current regimen  Overall Garen is much improved.  Appreciative of where he is in his health journey.  He knows to contact our office as needed with any concerns.  PLAN:  Successfully weaned off of MS Contin Oxycodone 20 mg as needed. Not requiring regularly Gabapentin to 100 mg twice daily Cymbalta 20 mg daily  Zofran and Compazine as needed for nausea Robaxin as needed for muscle spasms Miralax daily for constipation I will plan to see patient back in 4-6 weeks in collaboration with other oncology appointments.  Patient knows to contact office sooner if needed.   Patient expressed understanding and was in agreement with this plan. He also understands that He can call the clinic at any time with any questions, concerns, or complaints.    Time  Total: 30 min  Visit consisted of counseling and education dealing with the complex and emotionally intense issues of symptom management and palliative care in the setting of serious and potentially life-threatening illness.Greater than 50%  of this time was spent counseling and coordinating care related to the above assessment and plan.  Alda Lea, AGPCNP-BC  Palliative Medicine Team/[facility]

## 2022-07-13 ENCOUNTER — Encounter: Payer: Self-pay | Admitting: Hematology and Oncology

## 2022-07-14 ENCOUNTER — Other Ambulatory Visit: Payer: Self-pay | Admitting: Nurse Practitioner

## 2022-07-14 ENCOUNTER — Other Ambulatory Visit (HOSPITAL_COMMUNITY): Payer: Self-pay

## 2022-07-14 DIAGNOSIS — C9000 Multiple myeloma not having achieved remission: Secondary | ICD-10-CM

## 2022-07-14 DIAGNOSIS — G893 Neoplasm related pain (acute) (chronic): Secondary | ICD-10-CM

## 2022-07-14 DIAGNOSIS — Z515 Encounter for palliative care: Secondary | ICD-10-CM

## 2022-07-15 ENCOUNTER — Encounter: Payer: Self-pay | Admitting: Hematology and Oncology

## 2022-07-15 ENCOUNTER — Emergency Department (HOSPITAL_COMMUNITY)
Admission: EM | Admit: 2022-07-15 | Discharge: 2022-07-15 | Disposition: A | Discharge status: Home / Self Care | Payer: BC Managed Care – PPO | Attending: Emergency Medicine | Admitting: Emergency Medicine

## 2022-07-15 ENCOUNTER — Encounter: Payer: Self-pay | Admitting: Family Medicine

## 2022-07-15 ENCOUNTER — Other Ambulatory Visit: Payer: Self-pay | Admitting: Family Medicine

## 2022-07-15 ENCOUNTER — Emergency Department (HOSPITAL_COMMUNITY)
Admission: EM | Admit: 2022-07-15 | Discharge: 2022-07-15 | Disposition: A | Discharge status: Home / Self Care | Payer: BC Managed Care – PPO | Source: Home / Self Care | Attending: Emergency Medicine | Admitting: Emergency Medicine

## 2022-07-15 ENCOUNTER — Other Ambulatory Visit: Payer: Self-pay

## 2022-07-15 ENCOUNTER — Telehealth: Payer: Self-pay

## 2022-07-15 ENCOUNTER — Other Ambulatory Visit (HOSPITAL_COMMUNITY): Payer: Self-pay

## 2022-07-15 ENCOUNTER — Encounter: Payer: Self-pay | Admitting: Gastroenterology

## 2022-07-15 DIAGNOSIS — Z7982 Long term (current) use of aspirin: Secondary | ICD-10-CM | POA: Insufficient documentation

## 2022-07-15 DIAGNOSIS — G2581 Restless legs syndrome: Secondary | ICD-10-CM | POA: Insufficient documentation

## 2022-07-15 DIAGNOSIS — Z8579 Personal history of other malignant neoplasms of lymphoid, hematopoietic and related tissues: Secondary | ICD-10-CM | POA: Insufficient documentation

## 2022-07-15 LAB — BASIC METABOLIC PANEL
Anion gap: 9 (ref 5–15)
BUN: 16 mg/dL (ref 6–20)
CO2: 22 mmol/L (ref 22–32)
Calcium: 9.3 mg/dL (ref 8.9–10.3)
Chloride: 100 mmol/L (ref 98–111)
Creatinine, Ser: 1.01 mg/dL (ref 0.61–1.24)
GFR, Estimated: >60 mL/min (ref >60)
Glucose, Bld: 111 mg/dL — ABNORMAL HIGH (ref 70–99)
Potassium: 4.1 mmol/L (ref 3.5–5.1)
Sodium: 131 mmol/L — ABNORMAL LOW (ref 135–145)

## 2022-07-15 LAB — CBC
HCT: 36.4 % — ABNORMAL LOW (ref 39.0–52.0)
Hemoglobin: 11.5 g/dL — ABNORMAL LOW (ref 13.0–17.0)
MCH: 27.3 pg (ref 26.0–34.0)
MCHC: 31.6 g/dL (ref 30.0–36.0)
MCV: 86.3 fL (ref 80.0–100.0)
Platelets: 166 10*3/uL (ref 150–400)
RBC: 4.22 MIL/uL (ref 4.22–5.81)
RDW: 13.9 % (ref 11.5–15.5)
WBC: 7 10*3/uL (ref 4.0–10.5)
nRBC: 0 % (ref 0.0–0.2)

## 2022-07-15 LAB — MAGNESIUM: Magnesium: 1.7 mg/dL (ref 1.7–2.4)

## 2022-07-15 MED ORDER — ROPINIROLE HCL 0.25 MG PO TABS
0.2500 mg | ORAL_TABLET | Freq: Once | ORAL | Status: AC
  Administered 2022-07-15: 0.25 mg via ORAL
  Filled 2022-07-15: qty 1

## 2022-07-15 MED ORDER — MORPHINE SULFATE ER 30 MG PO TBCR
30.0000 mg | EXTENDED_RELEASE_TABLET | Freq: Three times a day (TID) | ORAL | 0 refills | Status: DC
  Filled 2022-07-15: qty 60, 20d supply, fill #0

## 2022-07-15 MED ORDER — LORAZEPAM 1 MG PO TABS
1.0000 mg | ORAL_TABLET | Freq: Once | ORAL | Status: AC
  Administered 2022-07-15: 1 mg via ORAL
  Filled 2022-07-15: qty 1

## 2022-07-15 MED ORDER — ROPINIROLE HCL 0.25 MG PO TABS: 0.2500 mg | ORAL_TABLET | Freq: Every evening | ORAL | 0 refills | Status: DC | PRN

## 2022-07-15 NOTE — ED Triage Notes (Signed)
Pt c/o restless leg started last night. Pt report unable to rest with the symptoms. Pt denies N/V.

## 2022-07-15 NOTE — ED Triage Notes (Signed)
Patient reports restless leg attack " jumpy" on bilateral legs this evening ,

## 2022-07-15 NOTE — ED Notes (Signed)
Patient verbalizes understanding of d/c instructions. Opportunities for questions and answers were provided. Pt d/c from ED and ambulated to lobby.

## 2022-07-15 NOTE — ED Provider Notes (Signed)
[facility] Provider Note   CSN: [id number] Arrival date & time: 07/15/22  1504     History  Chief Complaint  Patient presents with   bilateral restless leg     [name] is a 52 y.o. male, history of multiple myeloma, who presents to the ED secondary to shaking legs for the last day.  He states that he had difficulty falling asleep last night, so he went to the ER because his legs were shaking so badly, they did blood work, and gave him Ativan without any relief.  He states that since then he has been unable to sleep given that his legs are shaking so much.  He states that this has happened before, and he was given Dilaudid with complete relief of his symptoms.  Denies any recent trauma, no new medications.  Denies any weakness, loss of sensation in his legs.    Home Medications Prior to Admission medications   Medication Sig Start Date End Date Taking? Authorizing Provider  rOPINIRole (REQUIP) 0.25 MG tablet Take 1 tablet (0.25 mg total) by mouth at bedtime as needed for up to 10 days. 07/15/22 07/25/22 Yes Tandre Conly L, PA  acetaminophen (TYLENOL) 500 MG tablet Take 1 tablet (500 mg total) by mouth every 8 (eight) hours as needed for mild pain or moderate pain. 02/24/22   Thurnell Lose, MD  acyclovir (ZOVIRAX) 400 MG tablet Take 1 tablet (400 mg total) by mouth 2 (two) times daily. 02/26/22   Lincoln Brigham, PA-C  allopurinol (ZYLOPRIM) 300 MG tablet Take 1 tablet (300 mg total) by mouth daily. 07/09/22   Pickenpack-Cousar, Carlena Sax, NP  aspirin EC 81 MG tablet Take 81 mg by mouth daily. Swallow whole.    [provider]  clonazePAM (KLONOPIN) 0.5 MG tablet Take 1 tablet (0.5 mg total) by mouth 2 (two) times daily as needed for anxiety. 04/22/22   [name], MD  diazepam (VALIUM) 5 MG tablet SMARTSIG:1-2 Tablet(s) By Mouth 05/15/22   [provider]  docusate sodium (COLACE) 100 MG capsule Take 1 capsule (100  mg total) by mouth 2 (two) times daily as needed for mild constipation. 07/01/22   Pickenpack-Cousar, Carlena Sax, NP  DULoxetine (CYMBALTA) 20 MG capsule Take 1 capsule (20 mg total) by mouth daily. 04/22/22   [name], MD  gabapentin (NEURONTIN) 100 MG capsule Take 1 capsule (100 mg total) by mouth 2 (two) times daily. 04/02/22   Pickenpack-Cousar, Carlena Sax, NP  hydrochlorothiazide (HYDRODIURIL) 25 MG tablet Take 25 mg by mouth daily. 05/01/22   [provider]  lisinopril (ZESTRIL) 40 MG tablet Take 40 mg by mouth daily. 04/27/22   [provider]  Methocarbamol 1000 MG TABS Take 1,000 mg by mouth every 6 (six) hours as needed. 04/02/22   Pickenpack-Cousar, Carlena Sax, NP  morphine (MS CONTIN) 30 MG 12 hr tablet Take 1 tablet by mouth every 8 hours. 07/15/22   Pickenpack-Cousar, Carlena Sax, NP  ondansetron (ZOFRAN) 8 MG tablet Take 1 tablet (8 mg total) by mouth every 8 (eight) hours as needed for nausea or vomiting. 07/01/22   Pickenpack-Cousar, Carlena Sax, NP  Oxycodone HCl 20 MG TABS Take 1 tablet (20 mg total) by mouth every 6 (six) hours as needed. 04/03/22   Pickenpack-Cousar, Carlena Sax, NP  pantoprazole (PROTONIX) 40 MG tablet Take 1 tablet (40 mg total) by mouth 2 (two) times daily. 04/02/22   Pickenpack-Cousar, Carlena Sax, NP  polyethylene glycol  powder (GLYCOLAX/MIRALAX) 17 GM/SCOOP powder Take 1 capful (17 g) with water by mouth daily. 02/24/22   Thurnell Lose, MD  prochlorperazine (COMPAZINE) 10 MG tablet Take 1 tablet (10 mg total) by mouth every 6 (six) hours as needed for nausea or vomiting. 07/02/22   Orson Slick, MD  REVLIMID 25 MG capsule Take 1 capsule (25 mg total) by mouth daily. Take one capsule daily x 14 days. None for following 7 days 07/09/22   Orson Slick, MD  rosuvastatin (CRESTOR) 10 MG tablet Take 10 mg by mouth daily. 03/03/22   [provider]  senna (SENOKOT) 8.6 MG TABS tablet Take 1 tablet (8.6 mg total) by mouth in the morning and at bedtime.  04/28/22   Pickenpack-Cousar, Carlena Sax, NP      Allergies    Patient has no known allergies.    Review of Systems   Review of Systems  Neurological:  Negative for dizziness.    Physical Exam Updated Vital Signs BP (!) 147/91 (BP Location: Right Arm)   Pulse 97   Temp 98 F (36.7 C)   Resp 16   Ht '5\' 9"'$  (1.753 m)   Wt 102 kg   SpO2 100%   BMI 33.21 kg/m  Physical Exam Vitals and nursing note reviewed.  Constitutional:      General: He is not in acute distress.    Appearance: He is well-developed.  HENT:     Head: Normocephalic and atraumatic.  Eyes:     Conjunctiva/sclera: Conjunctivae normal.  Cardiovascular:     Rate and Rhythm: Normal rate and regular rhythm.     Heart sounds: No murmur heard. Pulmonary:     Effort: Pulmonary effort is normal. No respiratory distress.     Breath sounds: Normal breath sounds.  Abdominal:     Palpations: Abdomen is soft.     Tenderness: There is no abdominal tenderness.  Musculoskeletal:        General: No swelling.     Cervical back: Neck supple.     Comments: +shaking legs  Skin:    General: Skin is warm and dry.     Capillary Refill: Capillary refill takes less than 2 seconds.  Neurological:     Mental Status: He is alert.  Psychiatric:        Mood and Affect: Mood normal.     ED Results / Procedures / Treatments   Labs (all labs ordered are listed, but only abnormal results are displayed) Labs Reviewed - No data to display  EKG None  Radiology No results found.  Procedures Procedures    Medications Ordered in ED Medications  rOPINIRole (REQUIP) tablet 0.25 mg (has no administration in time range)    ED Course/ Medical Decision Making/ A&P                             Medical Decision Making Patient is a 52 year old male, here for restless legs, he is requesting Dilaudid.  I discussed that typically that is protocol for restless leg syndrome, and on exam his legs are shaking, we will give him  ropinirole, his partner will drive him home.  We sent him a short dose of ropinirole to start at home, as needed for his restless leg status, I requested that he follow-up with his primary care doctor, labs were checked last night, and were unremarkable.  Return precautions emphasized.  Risk Prescription drug management.  Final Clinical Impression(s) / ED Diagnoses Final diagnoses:  Restless leg syndrome    Rx / DC Orders ED Discharge Orders          Ordered    rOPINIRole (REQUIP) 0.25 MG tablet  At bedtime PRN        07/15/22 1611              Verline Kong Carlean Jews, PA 07/15/22 1615    Cristie Hem, MD 07/21/22 [phone]

## 2022-07-15 NOTE — Discharge Instructions (Addendum)
Please follow-up with your PCP. Take the ropinirole 1-3 hours before bedtime as needed for restless legs. Please do not drive after taking this medication as it may make you drowsy.

## 2022-07-15 NOTE — Telephone Encounter (Signed)
Medication Refill - Medication:   Klonopin 0.5 mg  Has the patient contacted their pharmacy? No.  Pt did not know the name of the medication (Agent: If no, request that the patient contact the pharmacy for the refill. If patient does not wish to contact the pharmacy document the reason why and proceed with request.) (Agent: If yes, when and what did the pharmacy advise?)  Preferred Pharmacy (with phone number or street name): [facility]  Has the patient been seen for an appointment in the last year OR does the patient have an upcoming appointment? Yes.    Agent: Please be advised that RX refills may take up to 3 business days. We ask that you follow-up with your pharmacy.

## 2022-07-15 NOTE — ED Provider Notes (Signed)
[facility] Emergency Department Provider Note MRN:  [id number]  Arrival date & time: 07/15/22     Chief Complaint   Restless Leg Syndrome   History of Present Illness   [name] is a 52 y.o. year-old male with hx of oncologic medical history including IgG Kappa Multiple Myeloma, L1, L2, and L3 vertebral compression fraction s/p kyphoplasty(9/27) and IM nailing of left humeral shaft (9/30), currently undergoing VRD therapy presents to the ED with chief complaint of restless legs.  Hx of the same. Denies trauma or injury.  States that symptoms occur when he lays down to sleep.  States that he can't get to sleep because of the restless legs.  Denies new weakness.  History provided by patient.   Review of Systems  Pertinent positive and negative review of systems noted in HPI.    Physical Exam   Vitals:   07/15/22 0239  BP: (!) 140/95  Pulse: 94  Resp: 16  Temp: 98.5 F (36.9 C)  SpO2: 100%    CONSTITUTIONAL:  well-appearing, NAD NEURO:  Alert and oriented x 3, CN 3-12 grossly intact, normal sensation and strength in upper and lower extremities EYES:  eyes equal and reactive ENT/NECK:  Supple, no stridor  CARDIO:  normal rate, regular rhythm, appears well-perfused  PULM:  No respiratory distress,  GI/GU:  non-distended,  MSK/SPINE:  No gross deformities, no edema, moves all extremities, ambulatory SKIN:  no rash, atraumatic   *Additional and/or pertinent findings included in MDM below  Diagnostic and Interventional Summary    EKG Interpretation  Date/Time:    Ventricular Rate:    PR Interval:    QRS Duration:   QT Interval:    QTC Calculation:   R Axis:     Text Interpretation:         Labs Reviewed  BASIC METABOLIC PANEL - Abnormal; Notable for the following components:      Result Value   Sodium 131 (*)    Glucose, Bld 111 (*)    All other components within normal limits  CBC - Abnormal; Notable for the following  components:   Hemoglobin 11.5 (*)    HCT 36.4 (*)    All other components within normal limits  MAGNESIUM    No orders to display    Medications  LORazepam (ATIVAN) tablet 1 mg (1 mg Oral Given 07/15/22 0316)     Procedures  /  Critical Care Procedures  ED Course and Medical Decision Making  I have reviewed the triage vital signs, the nursing notes, and pertinent available records from the EMR.  Social Determinants Affecting Complexity of Care: Patient has no clinically significant social determinants affecting this chief complaint..   ED Course:    Medical Decision Making Patient here with restless legs.  Will check labs.  Only bothered at night when trying to sleep.  Had successful treatment with dilaudid and ativan last visit.  It looks like the patient has messaged his doctor to get refill of his morphine.  I'll try giving him some ativan and check his electrolytes.  Don't feel that risks of IV narcotics are worth any potential benefit for this complaint.  Recommend PCP follow-up.  Amount and/or Complexity of Data Reviewed Labs: ordered.    Details: Normal mag Normal K  Risk Prescription drug management.     Consultants: No consultations were needed in caring for this patient.   Treatment and Plan: Emergency department workup does not suggest an emergent condition requiring  admission or immediate intervention beyond  what has been performed at this time. The patient is safe for discharge and has  been instructed to return immediately for worsening symptoms, change in  symptoms or any other concerns    Final Clinical Impressions(s) / ED Diagnoses     ICD-10-CM   1. Restless leg syndrome  G25.81       ED Discharge Orders     None         Discharge Instructions Discussed with and Provided to Patient:   Discharge Instructions   None      Montine Circle, PA-C 07/15/22 0405    Deno Etienne, DO 07/15/22 [id number]

## 2022-07-15 NOTE — Telephone Encounter (Signed)
Pt called to confirm that he was still taking the MS Contin, as he sent in a refill request. Pt reported taking the MS Contin daily as prescribed, Lexine Baton, NP notified and refill sent in per provider.

## 2022-07-16 ENCOUNTER — Inpatient Hospital Stay (HOSPITAL_BASED_OUTPATIENT_CLINIC_OR_DEPARTMENT_OTHER): Payer: BC Managed Care – PPO | Admitting: Hematology and Oncology

## 2022-07-16 ENCOUNTER — Other Ambulatory Visit: Payer: Self-pay

## 2022-07-16 ENCOUNTER — Inpatient Hospital Stay: Payer: BC Managed Care – PPO

## 2022-07-16 VITALS — HR 100

## 2022-07-16 DIAGNOSIS — C9000 Multiple myeloma not having achieved remission: Secondary | ICD-10-CM

## 2022-07-16 DIAGNOSIS — K449 Diaphragmatic hernia without obstruction or gangrene: Secondary | ICD-10-CM | POA: Diagnosis not present

## 2022-07-16 DIAGNOSIS — Z7982 Long term (current) use of aspirin: Secondary | ICD-10-CM | POA: Diagnosis not present

## 2022-07-16 DIAGNOSIS — R110 Nausea: Secondary | ICD-10-CM | POA: Diagnosis not present

## 2022-07-16 DIAGNOSIS — Z79899 Other long term (current) drug therapy: Secondary | ICD-10-CM | POA: Diagnosis not present

## 2022-07-16 DIAGNOSIS — Z86010 Personal history of colonic polyps: Secondary | ICD-10-CM | POA: Diagnosis not present

## 2022-07-16 DIAGNOSIS — K219 Gastro-esophageal reflux disease without esophagitis: Secondary | ICD-10-CM | POA: Diagnosis not present

## 2022-07-16 DIAGNOSIS — F17210 Nicotine dependence, cigarettes, uncomplicated: Secondary | ICD-10-CM | POA: Diagnosis not present

## 2022-07-16 DIAGNOSIS — I10 Essential (primary) hypertension: Secondary | ICD-10-CM | POA: Diagnosis not present

## 2022-07-16 DIAGNOSIS — E785 Hyperlipidemia, unspecified: Secondary | ICD-10-CM | POA: Diagnosis not present

## 2022-07-16 DIAGNOSIS — K5900 Constipation, unspecified: Secondary | ICD-10-CM | POA: Diagnosis not present

## 2022-07-16 DIAGNOSIS — Z5112 Encounter for antineoplastic immunotherapy: Secondary | ICD-10-CM | POA: Diagnosis not present

## 2022-07-16 DIAGNOSIS — Z79624 Long term (current) use of inhibitors of nucleotide synthesis: Secondary | ICD-10-CM | POA: Diagnosis not present

## 2022-07-16 DIAGNOSIS — Z7961 Long term (current) use of immunomodulator: Secondary | ICD-10-CM | POA: Diagnosis not present

## 2022-07-16 LAB — CMP (CANCER CENTER ONLY)
ALT: 14 U/L (ref 0–44)
AST: 12 U/L — ABNORMAL LOW (ref 15–41)
Albumin: 4.3 g/dL (ref 3.5–5.0)
Alkaline Phosphatase: 77 U/L (ref 38–126)
Anion gap: 8 (ref 5–15)
BUN: 14 mg/dL (ref 6–20)
CO2: 23 mmol/L (ref 22–32)
Calcium: 9.2 mg/dL (ref 8.9–10.3)
Chloride: 101 mmol/L (ref 98–111)
Creatinine: 0.9 mg/dL (ref 0.61–1.24)
GFR, Estimated: >60 mL/min (ref >60)
Glucose, Bld: 122 mg/dL — ABNORMAL HIGH (ref 70–99)
Potassium: 3.6 mmol/L (ref 3.5–5.1)
Sodium: 132 mmol/L — ABNORMAL LOW (ref 135–145)
Total Bilirubin: 0.6 mg/dL (ref 0.3–1.2)
Total Protein: 7.8 g/dL (ref 6.5–8.1)

## 2022-07-16 LAB — CBC WITH DIFFERENTIAL (CANCER CENTER ONLY)
Abs Immature Granulocytes: 0.03 10*3/uL (ref 0.00–0.07)
Basophils Absolute: 0 10*3/uL (ref 0.0–0.1)
Basophils Relative: 0 %
Eosinophils Absolute: 0 10*3/uL (ref 0.0–0.5)
Eosinophils Relative: 0 %
HCT: 39 % (ref 39.0–52.0)
Hemoglobin: 13.2 g/dL (ref 13.0–17.0)
Immature Granulocytes: 0 %
Lymphocytes Relative: 34 %
Lymphs Abs: 2.7 10*3/uL (ref 0.7–4.0)
MCH: 28.2 pg (ref 26.0–34.0)
MCHC: 33.8 g/dL (ref 30.0–36.0)
MCV: 83.3 fL (ref 80.0–100.0)
Monocytes Absolute: 0.7 10*3/uL (ref 0.1–1.0)
Monocytes Relative: 8 %
Neutro Abs: 4.7 10*3/uL (ref 1.7–7.7)
Neutrophils Relative %: 58 %
Platelet Count: 184 10*3/uL (ref 150–400)
RBC: 4.68 MIL/uL (ref 4.22–5.81)
RDW: 14.2 % (ref 11.5–15.5)
WBC Count: 8.1 10*3/uL (ref 4.0–10.5)
nRBC: 0 % (ref 0.0–0.2)

## 2022-07-16 MED ORDER — DENOSUMAB 120 MG/1.7ML ~~LOC~~ SOLN
120.0000 mg | Freq: Once | SUBCUTANEOUS | Status: AC
  Administered 2022-07-16: 120 mg via SUBCUTANEOUS
  Filled 2022-07-16: qty 1.7

## 2022-07-16 MED ORDER — DEXAMETHASONE 4 MG PO TABS
40.0000 mg | ORAL_TABLET | Freq: Once | ORAL | Status: AC
  Administered 2022-07-16: 40 mg via ORAL
  Filled 2022-07-16: qty 10

## 2022-07-16 MED ORDER — BORTEZOMIB CHEMO SQ INJECTION 3.5 MG (2.5MG/ML)
1.3000 mg/m2 | Freq: Once | INTRAMUSCULAR | Status: AC
  Administered 2022-07-16: 3 mg via SUBCUTANEOUS
  Filled 2022-07-16: qty 1.2

## 2022-07-16 NOTE — Patient Instructions (Signed)
[facility]  Discharge Instructions: Thank you for choosing [facility] to provide your oncology and hematology care.   If you have a lab appointment with the [facility], please go directly to the [facility] and check in at the registration area.   Wear comfortable clothing and clothing appropriate for easy access to any Portacath or PICC line.   We strive to give you quality time with your provider. You may need to reschedule your appointment if you arrive late (15 or more minutes).  Arriving late affects you and other patients whose appointments are after yours.  Also, if you miss three or more appointments without notifying the office, you may be dismissed from the clinic at the provider's discretion.      For prescription refill requests, have your pharmacy contact our office and allow 72 hours for refills to be completed.    Today you received the following chemotherapy and/or immunotherapy agents: bortezomib (velcade)       To help prevent nausea and vomiting after your treatment, we encourage you to take your nausea medication as directed.  BELOW ARE SYMPTOMS THAT SHOULD BE REPORTED IMMEDIATELY: *FEVER GREATER THAN 100.4 F (38 C) OR HIGHER *CHILLS OR SWEATING *NAUSEA AND VOMITING THAT IS NOT CONTROLLED WITH YOUR NAUSEA MEDICATION *UNUSUAL SHORTNESS OF BREATH *UNUSUAL BRUISING OR BLEEDING *URINARY PROBLEMS (pain or burning when urinating, or frequent urination) *BOWEL PROBLEMS (unusual diarrhea, constipation, pain near the anus) TENDERNESS IN MOUTH AND THROAT WITH OR WITHOUT PRESENCE OF ULCERS (sore throat, sores in mouth, or a toothache) UNUSUAL RASH, SWELLING OR PAIN  UNUSUAL VAGINAL DISCHARGE OR ITCHING   Items with * indicate a potential emergency and should be followed up as soon as possible or go to the Emergency Department if any problems should occur.  Please show the CHEMOTHERAPY ALERT CARD or IMMUNOTHERAPY ALERT  CARD at check-in to the Emergency Department and triage nurse.  Should you have questions after your visit or need to cancel or reschedule your appointment, please contact [facility]  Dept: [phone]  and follow the prompts.  Office hours are 8:00 a.m. to 4:30 p.m. Monday - Friday. Please note that voicemails left after 4:00 p.m. may not be returned until the following business day.  We are closed weekends and major holidays. You have access to a nurse at all times for urgent questions. Please call the main number to the clinic Dept: [phone] and follow the prompts.   For any non-urgent questions, you may also contact your provider using MyChart. We now offer e-Visits for anyone 28 and older to request care online for non-urgent symptoms. For details visit [email or web address].   Also download the MyChart app! Go to the app store, search "MyChart", open the app, select [facility], and log in with your MyChart username and password. Denosumab Injection (Oncology) What is this medication? DENOSUMAB (den oh SUE mab) prevents weakened bones caused by cancer. It may also be used to treat noncancerous bone tumors that cannot be removed by surgery. It can also be used to treat high calcium levels in the blood caused by cancer. It works by blocking a protein that causes bones to break down quickly. This slows down the release of calcium from bones, which lowers calcium levels in your blood. It also makes your bones stronger and less likely to break (fracture). This medicine may be used for other purposes; ask your health care provider or  pharmacist if you have questions. COMMON BRAND NAME(S): XGEVA What should I tell my care team before I take this medication? They need to know if you have any of these conditions: Dental disease Having surgery or tooth extraction Infection Kidney disease Low levels of calcium or vitamin D in the blood Malnutrition On  hemodialysis Skin conditions or sensitivity Thyroid or parathyroid disease An unusual reaction to denosumab, other medications, foods, dyes, or preservatives Pregnant or trying to get pregnant Breast-feeding How should I use this medication? This medication is for injection under the skin. It is given by your care team in a hospital or clinic setting. A special MedGuide will be given to you before each treatment. Be sure to read this information carefully each time. Talk to your care team about the use of this medication in children. While it may be prescribed for children as young as 13 years for selected conditions, precautions do apply. Overdosage: If you think you have taken too much of this medicine contact a poison control center or emergency room at once. NOTE: This medicine is only for you. Do not share this medicine with others. What if I miss a dose? Keep appointments for follow-up doses. It is important not to miss your dose. Call your care team if you are unable to keep an appointment. What may interact with this medication? Do not take this medication with any of the following: Other medications containing denosumab This medication may also interact with the following: Medications that lower your chance of fighting infection Steroid medications, such as prednisone or cortisone This list may not describe all possible interactions. Give your health care provider a list of all the medicines, herbs, non-prescription drugs, or dietary supplements you use. Also tell them if you smoke, drink alcohol, or use illegal drugs. Some items may interact with your medicine. What should I watch for while using this medication? Your condition will be monitored carefully while you are receiving this medication. You may need blood work while taking this medication. This medication may increase your risk of getting an infection. Call your care team for advice if you get a fever, chills, sore throat,  or other symptoms of a cold or flu. Do not treat yourself. Try to avoid being around people who are sick. You should make sure you get enough calcium and vitamin D while you are taking this medication, unless your care team tells you not to. Discuss the foods you eat and the vitamins you take with your care team. Some people who take this medication have severe bone, joint, or muscle pain. This medication may also increase your risk for jaw problems or a broken thigh bone. Tell your care team right away if you have severe pain in your jaw, bones, joints, or muscles. Tell your care team if you have any pain that does not go away or that gets worse. Talk to your care team if you may be pregnant. Serious birth defects can occur if you take this medication during pregnancy and for 5 months after the last dose. You will need a negative pregnancy test before starting this medication. Contraception is recommended while taking this medication and for 5 months after the last dose. Your care team can help you find the option that works for you. What side effects may I notice from receiving this medication? Side effects that you should report to your care team as soon as possible: Allergic reactions--skin rash, itching, hives, swelling of the face, lips, tongue, or  throat Bone, joint, or muscle pain Low calcium level--muscle pain or cramps, confusion, tingling, or numbness in the hands or feet Osteonecrosis of the jaw--pain, swelling, or redness in the mouth, numbness of the jaw, poor healing after dental work, unusual discharge from the mouth, visible bones in the mouth Side effects that usually do not require medical attention (report to your care team if they continue or are bothersome): Cough Diarrhea Fatigue Headache Nausea This list may not describe all possible side effects. Call your doctor for medical advice about side effects. You may report side effects to FDA at 1-800-FDA-1088. Where should I keep  my medication? This medication is given in a hospital or clinic. It will not be stored at home. NOTE: This sheet is a summary. It may not cover all possible information. If you have questions about this medicine, talk to your doctor, pharmacist, or health care provider.  2023 Elsevier/Gold Standard (2021-09-30 00:00:00)

## 2022-07-16 NOTE — Telephone Encounter (Signed)
Requested medication (s) are due for refill today - yes  Requested medication (s) are on the active medication list -yes  Future visit scheduled -yes  Last refill: 04/22/22 #15  Notes to clinic: non delegated Rx  Requested Prescriptions  Pending Prescriptions Disp Refills   clonazePAM (KLONOPIN) 0.5 MG tablet 15 tablet 0    Sig: Take 1 tablet (0.5 mg total) by mouth 2 (two) times daily as needed for anxiety.     Not Delegated - Psychiatry: Anxiolytics/Hypnotics 2 Failed - 07/15/2022 12:56 PM      Failed - This refill cannot be delegated      Failed - Urine Drug Screen completed in last 360 days      Passed - Patient is not pregnant      Passed - Valid encounter within last 6 months    Recent Outpatient Visits           3 weeks ago Essential hypertension   [facility], [place]   2 months ago Hospital discharge follow-up   [facility] at [facility], [place]   6 months ago Abnormal MRI   [facility], [place]   7 months ago Acute pain of left shoulder   [facility], [place]   7 months ago annual physical   [facility] at [facility], [place]       Future Appointments             In 2 months [name], MD [facility] at [facility]               Requested Prescriptions  Pending Prescriptions Disp Refills   clonazePAM (KLONOPIN) 0.5 MG tablet 15 tablet 0    Sig: Take 1 tablet (0.5 mg total) by mouth 2 (two) times daily as needed for anxiety.     Not Delegated - Psychiatry: Anxiolytics/Hypnotics 2 Failed - 07/15/2022 12:56 PM      Failed - This refill cannot be delegated      Failed - Urine Drug Screen completed in last 360 days      Passed - Patient is not pregnant      Passed - Valid encounter within last 6 months    Recent Outpatient Visits           3 weeks  ago Essential hypertension   [facility], [place]   2 months ago Hospital discharge follow-up   [place] at [facility], [place]   6 months ago Abnormal MRI   [facility] at [facility], [place]   7 months ago Acute pain of left shoulder   [facility], [place]   7 months ago annual physical   [place] at [facility], [place]       Future Appointments             In 2 months [name], MD [facility]

## 2022-07-16 NOTE — Progress Notes (Unsigned)
[name] [phone]   [phone]  PROGRESS NOTE  Patient Care Team: [name], MD as PCP - General (Family Medicine) Lorretta Harp, MD as PCP - Cardiology (Cardiology) Gala Romney Cristopher Estimable, MD as Attending Physician (Gastroenterology)  Hematological/Oncological History # IgG Kappa Multiple Myeloma, 1p32/1q21.  01/04/2022: MRI lumbar spine shows diffuse heterogeneous marrow signal with heterogeneous enhancement throughout the thoracolumbar spine.  Additionally, there is subacute-chronic L3 vertebral body compression fraction. 01/14/2022: establish care with Dr. Lorenso Courier  02/10/2022: bone marrow biopsy shows range of plasma cell involvement from 50% on the biopsy to 60% on aspirate smear slides and close to 90% on the clot section for an overall percentage estimated at approximately 80% overall. 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  02/26/2022: Cycle 1 of VRD therapy 04/01/2022: Cycle 2 of VRD therapy  04/23/2022: Cycle 3 of VRD therapy  05/14/2022: Cycle 4 of VRD therapy  06/04/2022: Underwent kyphoplasty so treatment was cancelled 06/11/2022: Resume Cycle 5 Day 8 of VRD therapy 06/25/2022: Cycle 6 Day 1 of VRD 07/16/2022: Cycle 7 Day 1 of VRD  Interval History:  [name] 52 y.o. male with medical history significant for newly diagnosed IgG kappa multiple myeloma who presents for a follow up visit. The patient's last visit was on 07/02/2022 at which time he continued on VRD therapy. In the interim, he underwent kyphoplasty last week.   On exam today [name] reports he was having such severe discomfort with his legs last night that he went to the emergency department.  They did provide him with Requip but it does not appear to be helping.  He notes that he has been getting very little sleep as a result of these restless leg issues.  He notes otherwise his treatments are going quite well.  Unfortunately he  did have a stomach ache with some diarrhea proximally for 1 to 2 days in the interim since the last visit.  It resolved without intervention.  He reports that he is following up with physical therapy in order to try to help with his back pain.  He reports his appetite is good and he is eating "too much".  He otherwise is at his baseline level of health and willing and able to proceed with treatment at this time.  He denies fevers, chills, sweats, shortness of breath, chest pain or cough. He has no other complaints. A full 10 point ROS was otherwise negative.  MEDICAL HISTORY:  Past Medical History:  Diagnosis Date   Allergy    Anemia    Atypical chest pain    Cancer (HCC)    Multiple myeloma with normocytic anemia   GERD (gastroesophageal reflux disease)    Hepatic cyst    Benign by MRI   Hiatal hernia    History of colonic polyps    Hyperlipidemia    Hypertension    Rectal bleeding    Tobacco abuse     SURGICAL HISTORY: Past Surgical History:  Procedure Laterality Date   COLONOSCOPY  01/10/2009     RMR: Normal rectum, normal colon; repeat in 2015 due to [place] of colon cancer   COLONOSCOPY N/A 01/09/2014   [id number] colonic polyps-removed as described   COLONOSCOPY N/A 09/18/2016   Procedure: COLONOSCOPY;  Surgeon: Daneil Dolin, MD;  [facility]: [facility];  Service: Endoscopy;  Laterality: N/A;  730    ESOPHAGOGASTRODUODENOSCOPY  10/04/2007   RMR: Distal esophageal erosions consistent with erosive reflux esophagitis, patulous gastroesophageal  junction status post passage of a  [place] dilator, 57 [place].  Otherwise, unremarkable esophagus.  Hiatal hernia.  Otherwise normal stomach.  Bulbar erosion   ESOPHAGOGASTRODUODENOSCOPY N/A 01/09/2014   Erosive reflux esophagitis. Small hiatal hernia   HUMERUS IM NAIL Left 02/22/2022   Procedure: INTRAMEDULLARY (IM) NAIL HUMERAL;  Surgeon: Nicholes Stairs, MD;  Location: [facility];  Service: Orthopedics;  Laterality: Left;   I & D  EXTREMITY Left 07/26/2018   Procedure: IRRIGATION AND DEBRIDEMENT EXTREMITY;  Surgeon: Dayna Barker, MD;  Location: [facility];  Service: Plastics;  Laterality: Left;   IR BONE TUMOR(S)RF ABLATION  02/05/2022   IR BONE TUMOR(S)RF ABLATION  02/24/2022   IR KYPHO EA ADDL LEVEL THORACIC OR LUMBAR  02/19/2022   IR KYPHO LUMBAR INC FX REDUCE BONE BX UNI/BIL CANNULATION INC/IMAGING  02/05/2022   IR KYPHO LUMBAR INC FX REDUCE BONE BX UNI/BIL CANNULATION INC/IMAGING  02/19/2022   PERCUTANEOUS PINNING Left 07/26/2018   Procedure: PERCUTANEOUS PINNING EXTREMITY;  Surgeon: Dayna Barker, MD;  Location: [facility];  Service: Plastics;  Laterality: Left;    SOCIAL HISTORY: Social History   Socioeconomic History   Marital status: Divorced    Spouse name: Not on file   Number of children: 2   Years of education: Not on file   Highest education level: Not on file  Occupational History   Occupation: Scientist, 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   Smoking status: Every Day    Packs/day: 0.25    Years: 10.00    Total pack years: 2.50    Types: Cigarettes   Smokeless tobacco: Never  Vaping Use   Vaping Use: Never used  Substance and Sexual Activity   Alcohol use: Yes    Alcohol/week: 0.0 standard drinks of alcohol    Comment: occasional/wine on the weekends   Drug use: No   Sexual activity: Not on file  Other Topics Concern   Not on file  Social History Narrative   Right handed   Lives alone one story home   Social Determinants of Health   Financial Resource Strain: Not on file  Food Insecurity: No Food Insecurity (07/17/2022)   Hunger Vital Sign    Worried About Running Out of Food in the Last Year: Never true    Ran Out of Food in the Last Year: Never true  Transportation Needs: No Transportation Needs (07/17/2022)   PRAPARE - Transportation    Lack of Transportation (Medical): No    Lack of Transportation (Non-Medical): No  Physical Activity:  Not on file  Stress: Not on file  Social Connections: Not on file  Intimate Partner Violence: Not At Risk (07/17/2022)   Humiliation, Afraid, Rape, and Kick questionnaire    Fear of Current or Ex-Partner: No    Emotionally Abused: No    Physically Abused: No    Sexually Abused: No    FAMILY HISTORY: Family History  Problem Relation Age of Onset   Heart disease Mother    Hypertension Mother    Diabetes Father    Hypertension Father    Colon cancer Sister        passed away from colon cancer, in her 10s   Heart attack Maternal Uncle    Prostate cancer Maternal Uncle    Diabetes Other    Pancreatic cancer Neg Hx    Rectal cancer Neg Hx    Esophageal cancer Neg Hx    Stomach  cancer Neg Hx     ALLERGIES:  has No Known Allergies.  MEDICATIONS:  No current facility-administered medications for this visit.   No current outpatient medications on file.   Facility-Administered Medications Ordered in Other Visits  Medication Dose Route Frequency Provider Last Rate Last Admin   0.9 %  sodium chloride infusion   Intravenous Continuous Palumbo, April, MD 125 mL/hr at 07/17/22 0524 New Bag at 07/17/22 0524   HYDROmorphone (DILAUDID) injection 0.5-1 mg  0.5-1 mg Intravenous Q3H PRN Opyd, Ilene Qua, MD       ondansetron (ZOFRAN) injection 4 mg  4 mg Intravenous Q6H PRN Opyd, Ilene Qua, MD       piperacillin-tazobactam (ZOSYN) IVPB 3.375 g  3.375 g Intravenous Q8H Gadhia, Jigna M, RPH       sodium chloride (PF) 0.9 % injection             REVIEW OF SYSTEMS:   Constitutional: ( - ) fevers, ( - )  chills , ( - ) night sweats Eyes: ( - ) blurriness of vision, ( - ) double vision, ( - ) watery eyes Ears, nose, mouth, throat, and face: ( - ) mucositis, ( - ) sore throat Respiratory: ( - ) cough, ( - ) dyspnea, ( - ) wheezes Cardiovascular: ( - ) palpitation, ( - ) chest discomfort, ( - ) lower extremity swelling Gastrointestinal:  ( - ) nausea, ( - ) heartburn, ( + ) change in bowel  habits Skin: ( - ) abnormal skin rashes Lymphatics: ( - ) new lymphadenopathy, ( - ) easy bruising Neurological: ( - ) numbness, ( - ) tingling, ( - ) new weaknesses Behavioral/Psych: ( - ) mood change, ( - ) new changes  All other systems were reviewed with the patient and are negative.  PHYSICAL EXAMINATION: ECOG PERFORMANCE STATUS: 1 - Symptomatic but completely ambulatory  Vitals:   07/16/22 1138  BP: 137/79  Pulse: (!) 103  Resp: 17  Temp: 98.4 F (36.9 C)  SpO2: 99%   Filed Weights   07/16/22 1138  Weight: 220 lb 3.2 oz (99.9 kg)    GENERAL: Well-appearing young African-American male, alert, no distress and comfortable SKIN: skin color, texture, turgor are normal, no rashes or significant lesions EYES: conjunctiva are pink and non-injected, sclera clear LUNGS: clear to auscultation and percussion with normal breathing effort HEART: regular rate & rhythm and no murmurs and no lower extremity edema Musculoskeletal: no cyanosis of digits and no clubbing  PSYCH: alert & oriented x 3, fluent speech NEURO: no focal motor/sensory deficits  LABORATORY DATA:  I have reviewed the data as listed    Latest Ref Rng & Units 07/17/2022    3:13 AM 07/16/2022   10:23 AM 07/15/2022    3:09 AM  CBC  WBC 4.0 - 10.5 K/uL 8.0  8.1  7.0   Hemoglobin 13.0 - 17.0 g/dL 11.8  13.2  11.5   Hematocrit 39.0 - 52.0 % 36.4  39.0  36.4   Platelets 150 - 400 K/uL 211  184  166        Latest Ref Rng & Units 07/17/2022    3:13 AM 07/16/2022   10:23 AM 07/15/2022    3:09 AM  CMP  Glucose 70 - 99 mg/dL 113  122  111   BUN 6 - 20 mg/dL 19  14  16   $ Creatinine 0.61 - 1.24 mg/dL 0.96  0.90  1.01   Sodium 135 - 145 mmol/L  130  132  131   Potassium 3.5 - 5.1 mmol/L 4.6  3.6  4.1   Chloride 98 - 111 mmol/L 99  101  100   CO2 22 - 32 mmol/L 18  23  22   $ Calcium 8.9 - 10.3 mg/dL 9.6  9.2  9.3   Total Protein 6.5 - 8.1 g/dL 7.3  7.8    Total Bilirubin 0.3 - 1.2 mg/dL 0.5  0.6    Alkaline Phos 38 -  126 U/L 60  77    AST 15 - 41 U/L 21  12    ALT 0 - 44 U/L 15  14      Lab Results  Component Value Date   MPROTEIN 0.8 (H) 06/18/2022   MPROTEIN 0.8 (H) 05/13/2022   MPROTEIN 1.3 (H) 04/23/2022   Lab Results  Component Value Date   KPAFRELGTCHN 24.0 (H) 06/25/2022   KPAFRELGTCHN 39.3 (H) 05/13/2022   KPAFRELGTCHN 44.2 (H) 04/23/2022   LAMBDASER 9.9 06/25/2022   LAMBDASER 13.4 05/13/2022   LAMBDASER 11.1 04/23/2022   KAPLAMBRATIO 2.42 (H) 06/25/2022   KAPLAMBRATIO 2.93 (H) 05/13/2022   KAPLAMBRATIO 3.98 (H) 04/23/2022   RADIOGRAPHIC STUDIES: CT ABDOMEN PELVIS W CONTRAST  Result Date: 07/17/2022 CLINICAL DATA:  Generalized abdominal pain. Low back pain. History of multiple myeloma with multilevel thoracic and lumbar kyphoplasty . EXAM: CT ABDOMEN AND PELVIS WITH CONTRAST TECHNIQUE: Multidetector CT imaging of the abdomen and pelvis was performed using the standard protocol following bolus administration of intravenous contrast. RADIATION DOSE REDUCTION: This exam was performed according to the departmental dose-optimization program which includes automated exposure control, adjustment of the mA and/or kV according to patient size and/or use of iterative reconstruction technique. CONTRAST:  111m OMNIPAQUE IOHEXOL 300 MG/ML  SOLN COMPARISON:  Similar study of 07/21/2021. FINDINGS: Lower chest: Minimal posterior atelectasis. Lung bases are otherwise clear. The cardiac size is normal. Hepatobiliary: Stable 1.6 cm cyst in segment 2 of the left lobe. The liver is 19 cm length mildly steatotic without mass enhancement. Gallbladder is contracted but grossly normal. No biliary dilatation. The hepatic portal main vein again measures prominent at 17 mm. Pancreas: No abnormality. Spleen: No abnormality.  No splenomegaly. Adrenals/Urinary Tract: Adrenal glands are unremarkable. Kidneys are normal, without renal calculi, focal lesion, or hydronephrosis. Bladder is unremarkable. Stomach/Bowel: There is  moderate chronic fold thickening in the stomach. Probable chronic gastritis. Endoscopy may be indicated to exclude underlying ulcerative component but this is grossly unchanged. No small bowel dilatation or wall thickening is seen. The appendix is increasingly prominent in caliber measuring 11 mm concerning for appendicitis with portions showing trace adjacent stranding. No appendiceal rupture or abscess is seen. There is moderate stool retention. There are colonic diverticula without evidence for diverticulitis. Vascular/Lymphatic: Anterior to L2 there is a linear radiopacity extending from the anterior vertebral body over to the right and into the lumen of the IVC. This is probably venously intravasated kyphoplasty cement as there appeared to be a cortical vein from L2 in this location emptying into the IVC previously. No other significant vascular findings are seen. No enlarged abdominal, pelvic or inguinal lymph nodes. Reproductive: Prostate is unremarkable. Other: No abdominal wall hernia or abnormality. No abdominopelvic ascites. Musculoskeletal: There is a marked interval change in the bones compared to the prior study. There is new demonstration of diffuse demineralization and heterogeneity in the lower ribs, visualized spine, pelvis and sacrum with innumerable lucent lesions and with vertebral body wedge compression fractures now seen from T10  through L4, moderate at T10, T12, L3 and L4 and relatively milder L1 and L2, with preservation of the normal vertebral height of L5. Kyphoplasty cement has been injected at T10, T 12, L1, L2, L3 and 4. Age of the compression fractures is not known but none of this was present 1 year ago. IMPRESSION: 1. The appendix is increasingly prominent in caliber measuring up to 11 mm with portions showing trace adjacent stranding. Findings concerning for acute appendicitis. No appendiceal rupture or abscess is seen. This result was phoned to Dr. Randal Buba at 5:07 a.m., 07/17/2022.  2. Constipation and diverticulosis. 3. Interval demonstration of diffuse demineralization and heterogeneity in the lower ribs, visualized spine, pelvis and sacrum with innumerable lucent lesions and multiple vertebral body wedge compression fractures. Age of the fractures is not known but none of this was present 1 year ago. Multilevel kyphoplasty. 4. Linear radiopacity extending from the anterior L2 vertebral body into the lumen of the IVC. This is probably venously intravasated kyphoplasty cement. 5. Mildly prominent liver with mild steatosis and stable 1.6 cm cyst in segment 2 of the left lobe. 6. Chronic gastritis. Endoscopy may be indicated to exclude underlying ulcerative component but this is grossly unchanged. Electronically Signed   By: Telford Nab M.D.   On: 07/17/2022 05:16   MR THORACIC SPINE WO CONTRAST  Result Date: 06/27/2022 CLINICAL DATA:  Low back and bilateral leg pain. History of multiple myeloma. EXAM: MRI THORACIC SPINE WITHOUT CONTRAST TECHNIQUE: Multiplanar, multisequence MR imaging of the thoracic spine was performed. No intravenous contrast was administered. COMPARISON:  Lumbar MRI 05/30/2022.  Thoracic MRI 03/07/2022. FINDINGS: Despite efforts by the technologist and patient, mild motion artifact is present on today's exam and could not be eliminated. This reduces exam sensitivity and specificity. Alignment:  Physiologic. Vertebrae: Widespread heterogeneity of the marrow is again noted throughout the cervicothoracic spine consistent with multiple myeloma. No acute fractures are identified. Subacute fractures at T10 and T12 have not significantly progressed from the recent lumbar MRI. There are chronic fractures at T11 and L1 which are grossly stable. Previous spinal augmentation at L1 and L2. Cord:  The thoracic cord appears normal in signal and caliber. Paraspinal and other soft tissues: No significant paraspinal abnormalities. Disc levels: Multilevel spondylosis with disc bulging  and scattered small thoracic disc protrusions, similar to previous study. No cord deformity or high-grade foraminal narrowing identified. IMPRESSION: 1. No acute findings or clear explanation for the patient's symptoms. 2. Subacute fractures at T10 and T12 have not significantly progressed from the recent lumbar MRI. Chronic fractures at T11 and L1 are grossly stable. 3. Widespread marrow heterogeneity consistent with multiple myeloma. 4. Stable spondylosis without cord deformity or high-grade foraminal narrowing. Electronically Signed   By: Richardean Sale M.D.   On: 06/27/2022 10:46    ASSESSMENT & PLAN [name] 52 y.o. male with medical history significant for IgG kappa multiple myeloma who presents for a follow up visit.  After review of the labs, review the records, discussion with the patient the findings are most consistent with newly diagnosed multiple myeloma.  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  At this time would recommend proceeding with VRD chemotherapy with the intention of referral for ASCT when his labs are appropriate.  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 --  Recommend VRD chemotherapy with intention of proceeding to transplant. -- Labs at each visit to include CBC, CMP, LDH with monthly restaging labs SPEP and serum free light chains -- Started VRD therapy on 02/26/2022.  PLAN: --Presents today to start Cycle 7, Day 1 of VRD therapy --Labs from today were reviewed and adequate for treatment. WBC 8.0, hemoglobin 11.8, MCV 85, and platelets 211 --Proceed with treatment today as planned without any dose modifications --Continue with weekly treatments and follow up in clinic in 2 weeks.   # Leg Discomfort/Restless Leg -- Patient provided with Requip by the emergency  department, has not been effective yet -- Recommend evaluation with neurology, will make referral today.  #Constipation: --Recommend to continue on colace but add miralax as well.   #Pathologic fractures-secondary to MM: --Involving L1, L2 and L3 compression fracture and left humerus fracture.  --Underwent kyphoplasty of L3 fracture on 02/19/2022 --Underwent medullary nailing of left humeral shaft on 02/22/2022 --Underwent kyphoplasty on 06/04/2022.   #Pain Medication -- Per palliative care continue on MS contin 60 mg q 8 hours, oxycodone 20 mg q 6 hours,  robaxin 1000 mg QID.  --Appreciate assistance of palliative care NP for further management of pain and other chronic symptoms.   #Supportive Care -- chemotherapy education complete -- port placement not required -- zofran 70m q8H PRN and compazine 1110mPO q6H for nausea -- acyclovir 40051mO BID for VCZ prophylaxis -- allopurinol 300m75m daily for TLS prophylaxis -- Dental clearance for Zometa/Denosumab approved. Last dose of denosumab 07/16/2022. Continue monthly.   No orders of the defined types were placed in this encounter.   All questions were answered. The patient knows to call the clinic with any problems, questions or concerns.  I have spent a total of 30 minutes minutes of face-to-face and non-face-to-face time, preparing to see the pati[place]landedically appropriate examination, counseling and educating the patient, ordering medications/tests, documenting clinical information in the electronic health record,  and care coordination.   JohnLedell Peoples Department of Hematology/Oncology [facility]: [phone]: [phone]: [email or web address]  07/17/2022 8:54 AM

## 2022-07-17 ENCOUNTER — Other Ambulatory Visit: Payer: Self-pay | Admitting: *Deleted

## 2022-07-17 ENCOUNTER — Observation Stay (HOSPITAL_COMMUNITY)
Admission: EM | Admit: 2022-07-17 | Discharge: 2022-07-18 | Disposition: A | Discharge status: Home / Self Care | Payer: BC Managed Care – PPO | Attending: Internal Medicine | Admitting: Internal Medicine

## 2022-07-17 ENCOUNTER — Other Ambulatory Visit: Payer: Self-pay

## 2022-07-17 ENCOUNTER — Encounter: Payer: Self-pay | Admitting: Hematology and Oncology

## 2022-07-17 ENCOUNTER — Encounter (HOSPITAL_COMMUNITY): Payer: Self-pay

## 2022-07-17 ENCOUNTER — Telehealth: Payer: Self-pay | Admitting: Internal Medicine

## 2022-07-17 ENCOUNTER — Emergency Department (HOSPITAL_COMMUNITY): Payer: BC Managed Care – PPO

## 2022-07-17 DIAGNOSIS — Z79899 Other long term (current) drug therapy: Secondary | ICD-10-CM | POA: Diagnosis not present

## 2022-07-17 DIAGNOSIS — R1084 Generalized abdominal pain: Secondary | ICD-10-CM | POA: Diagnosis not present

## 2022-07-17 DIAGNOSIS — Z85828 Personal history of other malignant neoplasm of skin: Secondary | ICD-10-CM | POA: Diagnosis not present

## 2022-07-17 DIAGNOSIS — X58XXXA Exposure to other specified factors, initial encounter: Secondary | ICD-10-CM | POA: Insufficient documentation

## 2022-07-17 DIAGNOSIS — K7689 Other specified diseases of liver: Secondary | ICD-10-CM | POA: Diagnosis not present

## 2022-07-17 DIAGNOSIS — S32000A Wedge compression fracture of unspecified lumbar vertebra, initial encounter for closed fracture: Secondary | ICD-10-CM | POA: Diagnosis present

## 2022-07-17 DIAGNOSIS — F17210 Nicotine dependence, cigarettes, uncomplicated: Secondary | ICD-10-CM | POA: Diagnosis not present

## 2022-07-17 DIAGNOSIS — E782 Mixed hyperlipidemia: Secondary | ICD-10-CM | POA: Diagnosis present

## 2022-07-17 DIAGNOSIS — R109 Unspecified abdominal pain: Secondary | ICD-10-CM | POA: Diagnosis not present

## 2022-07-17 DIAGNOSIS — K219 Gastro-esophageal reflux disease without esophagitis: Secondary | ICD-10-CM | POA: Diagnosis present

## 2022-07-17 DIAGNOSIS — Z7982 Long term (current) use of aspirin: Secondary | ICD-10-CM | POA: Diagnosis not present

## 2022-07-17 DIAGNOSIS — K5790 Diverticulosis of intestine, part unspecified, without perforation or abscess without bleeding: Secondary | ICD-10-CM | POA: Diagnosis not present

## 2022-07-17 DIAGNOSIS — I10 Essential (primary) hypertension: Secondary | ICD-10-CM | POA: Insufficient documentation

## 2022-07-17 DIAGNOSIS — C9000 Multiple myeloma not having achieved remission: Secondary | ICD-10-CM | POA: Diagnosis present

## 2022-07-17 DIAGNOSIS — S32009A Unspecified fracture of unspecified lumbar vertebra, initial encounter for closed fracture: Secondary | ICD-10-CM | POA: Insufficient documentation

## 2022-07-17 DIAGNOSIS — E871 Hypo-osmolality and hyponatremia: Secondary | ICD-10-CM | POA: Diagnosis not present

## 2022-07-17 DIAGNOSIS — K3580 Unspecified acute appendicitis: Secondary | ICD-10-CM | POA: Diagnosis not present

## 2022-07-17 DIAGNOSIS — R197 Diarrhea, unspecified: Secondary | ICD-10-CM | POA: Diagnosis not present

## 2022-07-17 LAB — COMPREHENSIVE METABOLIC PANEL
ALT: 15 U/L (ref 0–44)
AST: 21 U/L (ref 15–41)
Albumin: 4 g/dL (ref 3.5–5.0)
Alkaline Phosphatase: 60 U/L (ref 38–126)
Anion gap: 13 (ref 5–15)
BUN: 19 mg/dL (ref 6–20)
CO2: 18 mmol/L — ABNORMAL LOW (ref 22–32)
Calcium: 9.6 mg/dL (ref 8.9–10.3)
Chloride: 99 mmol/L (ref 98–111)
Creatinine, Ser: 0.96 mg/dL (ref 0.61–1.24)
GFR, Estimated: >60 mL/min (ref >60)
Glucose, Bld: 113 mg/dL — ABNORMAL HIGH (ref 70–99)
Potassium: 4.6 mmol/L (ref 3.5–5.1)
Sodium: 130 mmol/L — ABNORMAL LOW (ref 135–145)
Total Bilirubin: 0.5 mg/dL (ref 0.3–1.2)
Total Protein: 7.3 g/dL (ref 6.5–8.1)

## 2022-07-17 LAB — KAPPA/LAMBDA LIGHT CHAINS
Kappa free light chain: 14 mg/L (ref 3.3–19.4)
Kappa, lambda light chain ratio: 1.54 (ref 0.26–1.65)
Lambda free light chains: 9.1 mg/L (ref 5.7–26.3)

## 2022-07-17 LAB — URINALYSIS, ROUTINE W REFLEX MICROSCOPIC
Bacteria, UA: NONE SEEN
Bilirubin Urine: NEGATIVE
Glucose, UA: >=500 mg/dL — AB
Hgb urine dipstick: NEGATIVE
Ketones, ur: NEGATIVE mg/dL
Leukocytes,Ua: NEGATIVE
Nitrite: NEGATIVE
Protein, ur: NEGATIVE mg/dL
Specific Gravity, Urine: 1.012 (ref 1.005–1.030)
pH: 5 (ref 5.0–8.0)

## 2022-07-17 LAB — CBC WITH DIFFERENTIAL/PLATELET
Abs Immature Granulocytes: 0.04 10*3/uL (ref 0.00–0.07)
Basophils Absolute: 0 10*3/uL (ref 0.0–0.1)
Basophils Relative: 0 %
Eosinophils Absolute: 0 10*3/uL (ref 0.0–0.5)
Eosinophils Relative: 0 %
HCT: 36.4 % — ABNORMAL LOW (ref 39.0–52.0)
Hemoglobin: 11.8 g/dL — ABNORMAL LOW (ref 13.0–17.0)
Immature Granulocytes: 1 %
Lymphocytes Relative: 17 %
Lymphs Abs: 1.4 10*3/uL (ref 0.7–4.0)
MCH: 27.6 pg (ref 26.0–34.0)
MCHC: 32.4 g/dL (ref 30.0–36.0)
MCV: 85 fL (ref 80.0–100.0)
Monocytes Absolute: 0.7 10*3/uL (ref 0.1–1.0)
Monocytes Relative: 9 %
Neutro Abs: 5.9 10*3/uL (ref 1.7–7.7)
Neutrophils Relative %: 73 %
Platelets: 211 10*3/uL (ref 150–400)
RBC: 4.28 MIL/uL (ref 4.22–5.81)
RDW: 14.3 % (ref 11.5–15.5)
WBC: 8 10*3/uL (ref 4.0–10.5)
nRBC: 0 % (ref 0.0–0.2)

## 2022-07-17 LAB — LIPASE, BLOOD: Lipase: 52 U/L — ABNORMAL HIGH (ref 11–51)

## 2022-07-17 MED ORDER — LISINOPRIL 10 MG PO TABS
40.0000 mg | ORAL_TABLET | Freq: Every day | ORAL | Status: DC
  Administered 2022-07-17 – 2022-07-18 (×2): 40 mg via ORAL
  Filled 2022-07-17 (×2): qty 4

## 2022-07-17 MED ORDER — DOCUSATE SODIUM 100 MG PO CAPS: 100.0000 mg | ORAL_CAPSULE | Freq: Two times a day (BID) | ORAL | Status: DC | PRN

## 2022-07-17 MED ORDER — DULOXETINE HCL 20 MG PO CPEP
20.0000 mg | ORAL_CAPSULE | Freq: Every day | ORAL | Status: DC
  Administered 2022-07-17 – 2022-07-18 (×2): 20 mg via ORAL
  Filled 2022-07-17 (×2): qty 1

## 2022-07-17 MED ORDER — CLONAZEPAM 0.125 MG PO TBDP: 0.5000 mg | ORAL_TABLET | Freq: Two times a day (BID) | ORAL | Status: DC | PRN

## 2022-07-17 MED ORDER — ALLOPURINOL 300 MG PO TABS
300.0000 mg | ORAL_TABLET | Freq: Every day | ORAL | Status: DC
  Administered 2022-07-17 – 2022-07-18 (×2): 300 mg via ORAL
  Filled 2022-07-17 (×2): qty 1

## 2022-07-17 MED ORDER — SODIUM CHLORIDE 0.9 % IV SOLN: INTRAVENOUS | Status: DC

## 2022-07-17 MED ORDER — HYDROMORPHONE HCL 1 MG/ML IJ SOLN: 0.5000 mg | INTRAMUSCULAR | Status: DC | PRN

## 2022-07-17 MED ORDER — HYDROMORPHONE HCL 1 MG/ML IJ SOLN: 1.0000 mg | INTRAMUSCULAR | Status: DC | PRN

## 2022-07-17 MED ORDER — PANTOPRAZOLE SODIUM 40 MG IV SOLR
40.0000 mg | Freq: Every day | INTRAVENOUS | Status: DC
  Administered 2022-07-17: 40 mg via INTRAVENOUS
  Filled 2022-07-17: qty 10

## 2022-07-17 MED ORDER — FENTANYL CITRATE PF 50 MCG/ML IJ SOSY
50.0000 ug | PREFILLED_SYRINGE | Freq: Once | INTRAMUSCULAR | Status: AC
  Administered 2022-07-17: 50 ug via INTRAVENOUS
  Filled 2022-07-17: qty 1

## 2022-07-17 MED ORDER — GABAPENTIN 100 MG PO CAPS
100.0000 mg | ORAL_CAPSULE | Freq: Two times a day (BID) | ORAL | Status: DC
  Administered 2022-07-17 – 2022-07-18 (×3): 100 mg via ORAL
  Filled 2022-07-17 (×3): qty 1

## 2022-07-17 MED ORDER — POLYETHYLENE GLYCOL 3350 17 G PO PACK: 17.0000 g | PACK | Freq: Every day | ORAL | Status: DC | PRN

## 2022-07-17 MED ORDER — ASPIRIN 81 MG PO TBEC
81.0000 mg | DELAYED_RELEASE_TABLET | Freq: Every day | ORAL | Status: DC
  Administered 2022-07-17 – 2022-07-18 (×2): 81 mg via ORAL
  Filled 2022-07-17 (×2): qty 1

## 2022-07-17 MED ORDER — IOHEXOL 300 MG/ML  SOLN
100.0000 mL | Freq: Once | INTRAMUSCULAR | Status: AC | PRN
  Administered 2022-07-17: 100 mL via INTRAVENOUS

## 2022-07-17 MED ORDER — ACYCLOVIR 400 MG PO TABS
400.0000 mg | ORAL_TABLET | Freq: Two times a day (BID) | ORAL | Status: DC
  Administered 2022-07-17 – 2022-07-18 (×3): 400 mg via ORAL
  Filled 2022-07-17 (×3): qty 1

## 2022-07-17 MED ORDER — ACETAMINOPHEN 325 MG PO TABS: 650.0000 mg | ORAL_TABLET | Freq: Four times a day (QID) | ORAL | Status: DC | PRN

## 2022-07-17 MED ORDER — PIPERACILLIN-TAZOBACTAM 3.375 G IVPB
3.3750 g | Freq: Three times a day (TID) | INTRAVENOUS | Status: DC
  Administered 2022-07-17 – 2022-07-18 (×3): 3.375 g via INTRAVENOUS
  Filled 2022-07-17 (×4): qty 50

## 2022-07-17 MED ORDER — ROPINIROLE HCL 0.25 MG PO TABS
0.2500 mg | ORAL_TABLET | Freq: Every evening | ORAL | Status: DC | PRN
  Filled 2022-07-17: qty 1

## 2022-07-17 MED ORDER — PIPERACILLIN-TAZOBACTAM 3.375 G IVPB 30 MIN
3.3750 g | Freq: Once | INTRAVENOUS | Status: AC
  Administered 2022-07-17: 3.375 g via INTRAVENOUS
  Filled 2022-07-17: qty 50

## 2022-07-17 MED ORDER — ROSUVASTATIN CALCIUM 10 MG PO TABS
10.0000 mg | ORAL_TABLET | Freq: Every day | ORAL | Status: DC
  Administered 2022-07-17 – 2022-07-18 (×2): 10 mg via ORAL
  Filled 2022-07-17 (×2): qty 1

## 2022-07-17 MED ORDER — DROPERIDOL 2.5 MG/ML IJ SOLN
1.2500 mg | Freq: Once | INTRAMUSCULAR | Status: AC
  Administered 2022-07-17: 1.25 mg via INTRAVENOUS
  Filled 2022-07-17: qty 2

## 2022-07-17 MED ORDER — ONDANSETRON HCL 4 MG/2ML IJ SOLN: 4.0000 mg | Freq: Four times a day (QID) | INTRAMUSCULAR | Status: DC | PRN

## 2022-07-17 MED ORDER — ACETAMINOPHEN 650 MG RE SUPP: 650.0000 mg | Freq: Four times a day (QID) | RECTAL | Status: DC | PRN

## 2022-07-17 MED ORDER — MORPHINE SULFATE ER 30 MG PO TBCR
30.0000 mg | EXTENDED_RELEASE_TABLET | Freq: Two times a day (BID) | ORAL | Status: DC
  Administered 2022-07-17 – 2022-07-18 (×3): 30 mg via ORAL
  Filled 2022-07-17 (×3): qty 1

## 2022-07-17 MED ORDER — PANTOPRAZOLE SODIUM 40 MG PO TBEC
40.0000 mg | DELAYED_RELEASE_TABLET | Freq: Two times a day (BID) | ORAL | Status: DC
  Administered 2022-07-17 – 2022-07-18 (×3): 40 mg via ORAL
  Filled 2022-07-17 (×3): qty 1

## 2022-07-17 MED ORDER — CLONAZEPAM 0.5 MG PO TABS: 0.5000 mg | ORAL_TABLET | Freq: Two times a day (BID) | ORAL | Status: DC | PRN

## 2022-07-17 MED ORDER — SODIUM CHLORIDE (PF) 0.9 % IJ SOLN
INTRAMUSCULAR | Status: AC
  Filled 2022-07-17: qty 50

## 2022-07-17 NOTE — ED Triage Notes (Signed)
Pt c/o generalized abdominal pain, nausea, lower back pain and restless leg pain that is keeping him up.

## 2022-07-17 NOTE — Progress Notes (Signed)
Pharmacy Antibiotic Note  [name] is a 52 y.o. male admitted on 07/17/2022 with acute appendicitis. Pharmacy has been consulted for Zosyn dosing.  Plan: Zosyn 3.375g IV q8h (each dose infused over 4 hours).  Need for further dosage adjustment appears unlikely at present so pharmacy will sign off at this time.  Please reconsult if a change in clinical status warrants re-evaluation of dosage.      Temp (24hrs), Avg:98.5 F (36.9 C), Min:98.4 F (36.9 C), Max:98.6 F (37 C)  Recent Labs  Lab 07/15/22 0309 07/16/22 1023 07/17/22 0313  WBC 7.0 8.1 8.0  CREATININE 1.01 0.90 0.96    Estimated Creatinine Clearance: 103.7 mL/min (by C-G formula based on SCr of 0.96 mg/dL).    No Known Allergies   Thank you for allowing pharmacy to be a part of this patient's care.  Lindell Spar, PharmD, BCPS Clinical Pharmacist 07/17/2022 6:36 AM

## 2022-07-17 NOTE — Telephone Encounter (Signed)
Scheduled appt per 2/22 referral. Called pt, no answer. Left msg with appt date/time. Requested for pt to call back to confirm appt.

## 2022-07-17 NOTE — H&P (Signed)
History and Physical    Patient: [name] [id number] DOB: 05-18-1971 DOA: 07/17/2022 DOS: the patient was seen and examined on 07/17/2022 PCP: [name], MD  Patient coming from: Home  Chief Complaint:  Chief Complaint  Patient presents with   Abdominal Pain   HPI: IBAN [name] is a 52 y.o. male with medical history significant of seasonal allergies, anemia, atypical chest pain, multiple myeloma, normocytic anemia, hiatal hernia, GERD, benign hepatic cysts, colon polyps, rectal bleeding, hyperlipidemia, hypertension, history of tobacco abuse who presented to the emergency department with generalized abdominal pain for the past 3 days associated with nausea/dry heaving, but no emesis multiple episodes of diarrhea yesterday and 2 days ago.  No fever, chills or night sweats. No sore throat, rhinorrhea, dyspnea, wheezing or hemoptysis.  No chest pain, palpitations, diaphoresis, PND, orthopnea or pitting edema of the lower extremities.  No  constipation, melena or hematochezia.  No flank pain, dysuria, frequency or hematuria.  No polyuria, polydipsia, polyphagia or blurred vision.  Lab work: Urinalysis showed glucosuria more than 500 mg/dL.  CBC is her white count 8.0, hemoglobin 11.8 g/dL platelets 211.  Lipase was 52.  CMP showed a sodium 130 and CO2 18 mmol/L with a normal anion gap.  Glucose was 130 mg/dL.  The rest of the CMP measurements were normal.  Imaging: CT abdomen/pelvis with contrast showed prominent in caliber appendix showing trace adjacent stranding concerning for appendicitis.  No rupture or abscess seen.  There is constipation and diverticulosis.  There was interval demonstration of diffuse demineralization and heterogeneity of the lower ribs, visualized spine, pelvis and sacrum with innumerable lucent lesions and multiple vertebral body wedge compression fractures.  Age of the fracture is not known but none of this was present a year ago.  There is multilevel  kyphoplasty.  There is linear radiopacity extending from the anterior L2 vertebral body into the lumen of the IVC.  This is probably venously intravasation to kyphoplasty cement.  There is mildly prominent liver with mild steatosis and a stable 1.6 cm cyst.  Chronic gastritis that is grossly unchanged.  Consider endoscopy.  ED course: Initial vital signs were temperature 98.6 F, pulse 106, respiration 20, BP 137/77 mmHg O2 sat 100% on room air.  The patient received fentanyl 50 mcg IVP droperidol 1.25 mg IVP and 3.375 g IVPB of Zosyn   Review of Systems: As mentioned in the history of present illness. All other systems reviewed and are negative. Past Medical History:  Diagnosis Date   Allergy    Anemia    Atypical chest pain    Cancer (HCC)    Multiple myeloma with normocytic anemia   GERD (gastroesophageal reflux disease)    Hepatic cyst    Benign by MRI   Hiatal hernia    History of colonic polyps    Hyperlipidemia    Hypertension    Rectal bleeding    Tobacco abuse    Past Surgical History:  Procedure Laterality Date   COLONOSCOPY  01/10/2009     RMR: Normal rectum, normal colon; repeat in 2015 due to [place] of colon cancer   COLONOSCOPY N/A 01/09/2014   [id number] colonic polyps-removed as described   COLONOSCOPY N/A 09/18/2016   Procedure: COLONOSCOPY;  Surgeon: Daneil Dolin, MD;  [facility]: [facility];  Service: Endoscopy;  Laterality: N/A;  730    ESOPHAGOGASTRODUODENOSCOPY  10/04/2007   RMR: Distal esophageal erosions consistent with erosive reflux esophagitis, patulous gastroesophageal junction status post passage of a  Maloney dilator, 52 [place].  Otherwise, unremarkable esophagus.  Hiatal hernia.  Otherwise normal stomach.  Bulbar erosion   ESOPHAGOGASTRODUODENOSCOPY N/A 01/09/2014   Erosive reflux esophagitis. Small hiatal hernia   HUMERUS IM NAIL Left 02/22/2022   Procedure: INTRAMEDULLARY (IM) NAIL HUMERAL;  Surgeon: Nicholes Stairs, MD;  Location: [place];   Service: Orthopedics;  Laterality: Left;   I & D EXTREMITY Left 07/26/2018   Procedure: IRRIGATION AND DEBRIDEMENT EXTREMITY;  Surgeon: Dayna Barker, MD;  Location: [place];  Service: Plastics;  Laterality: Left;   IR BONE TUMOR(S)RF ABLATION  02/05/2022   IR BONE TUMOR(S)RF ABLATION  02/24/2022   IR KYPHO EA ADDL LEVEL THORACIC OR LUMBAR  02/19/2022   IR KYPHO LUMBAR INC FX REDUCE BONE BX UNI/BIL CANNULATION INC/IMAGING  02/05/2022   IR KYPHO LUMBAR INC FX REDUCE BONE BX UNI/BIL CANNULATION INC/IMAGING  02/19/2022   PERCUTANEOUS PINNING Left 07/26/2018   Procedure: PERCUTANEOUS PINNING EXTREMITY;  Surgeon: Dayna Barker, MD;  Location: [place];  Service: Plastics;  Laterality: Left;   Social History:  reports that he has been smoking cigarettes. He has a 2.50 pack-year smoking history. He has never used smokeless tobacco. He reports current alcohol use. He reports that he does not use drugs.  No Known Allergies  Family History  Problem Relation Age of Onset   Heart disease Mother    Hypertension Mother    Diabetes Father    Hypertension Father    Colon cancer Sister        passed away from colon cancer, in her 56s   Heart attack Maternal Uncle    Prostate cancer Maternal Uncle    Diabetes Other    Pancreatic cancer Neg Hx    Rectal cancer Neg Hx    Esophageal cancer Neg Hx    Stomach cancer Neg Hx     Prior to Admission medications   Medication Sig Start Date End Date Taking? Authorizing Provider  acetaminophen (TYLENOL) 500 MG tablet Take 1 tablet (500 mg total) by mouth every 8 (eight) hours as needed for mild pain or moderate pain. Patient not taking: Reported on 07/16/2022 02/24/22   Thurnell Lose, MD  acyclovir (ZOVIRAX) 400 MG tablet Take 1 tablet (400 mg total) by mouth 2 (two) times daily. 02/26/22   Lincoln Brigham, PA-C  allopurinol (ZYLOPRIM) 300 MG tablet Take 1 tablet (300 mg total) by mouth daily. 07/09/22   Pickenpack-Cousar, Carlena Sax, NP  aspirin EC 81 MG tablet Take 81  mg by mouth daily. Swallow whole.    [provider]  cetirizine (ZYRTEC) 10 MG tablet Take 1 tablet by mouth daily.    [provider]  clonazePAM (KLONOPIN) 0.5 MG tablet Take 1 tablet (0.5 mg total) by mouth 2 (two) times daily as needed for anxiety. 04/22/22   [name], MD  diazepam (VALIUM) 5 MG tablet SMARTSIG:1-2 Tablet(s) By Mouth Patient not taking: Reported on 07/16/2022 05/15/22   [provider]  docusate sodium (COLACE) 100 MG capsule Take 1 capsule (100 mg total) by mouth 2 (two) times daily as needed for mild constipation. Patient not taking: Reported on 07/16/2022 07/01/22   Pickenpack-Cousar, Carlena Sax, NP  DULoxetine (CYMBALTA) 20 MG capsule Take 1 capsule (20 mg total) by mouth daily. 04/22/22   [name], MD  gabapentin (NEURONTIN) 100 MG capsule Take 1 capsule (100 mg total) by mouth 2 (two) times daily. 04/02/22   Pickenpack-Cousar, Carlena Sax, NP  hydrochlorothiazide (HYDRODIURIL) 25 MG tablet Take 25  mg by mouth daily. 05/01/22   [provider]  lisinopril (ZESTRIL) 40 MG tablet Take 40 mg by mouth daily. 04/27/22   [provider]  Methocarbamol 1000 MG TABS Take 1,000 mg by mouth every 6 (six) hours as needed. 04/02/22   Pickenpack-Cousar, Carlena Sax, NP  morphine (MS CONTIN) 30 MG 12 hr tablet Take 1 tablet by mouth every 8 hours. 07/15/22   Pickenpack-Cousar, Carlena Sax, NP  ondansetron (ZOFRAN) 8 MG tablet Take 1 tablet (8 mg total) by mouth every 8 (eight) hours as needed for nausea or vomiting. 07/01/22   Pickenpack-Cousar, Carlena Sax, NP  Oxycodone HCl 20 MG TABS Take 1 tablet (20 mg total) by mouth every 6 (six) hours as needed. Patient not taking: Reported on 07/16/2022 04/03/22   Pickenpack-Cousar, Carlena Sax, NP  pantoprazole (PROTONIX) 40 MG tablet Take 1 tablet (40 mg total) by mouth 2 (two) times daily. 04/02/22   Pickenpack-Cousar, Carlena Sax, NP  polyethylene glycol powder (GLYCOLAX/MIRALAX) 17 GM/SCOOP powder Take 1 capful (17  g) with water by mouth daily. 02/24/22   Thurnell Lose, MD  prochlorperazine (COMPAZINE) 10 MG tablet Take 1 tablet (10 mg total) by mouth every 6 (six) hours as needed for nausea or vomiting. Patient not taking: Reported on 07/16/2022 07/02/22   Orson Slick, MD  REVLIMID 25 MG capsule Take 1 capsule (25 mg total) by mouth daily. Take one capsule daily x 14 days. None for following 7 days 07/09/22   Orson Slick, MD  rOPINIRole (REQUIP) 0.25 MG tablet Take 1 tablet (0.25 mg total) by mouth at bedtime as needed for up to 10 days. 07/15/22 07/25/22  Small, Brooke L, PA  rosuvastatin (CRESTOR) 10 MG tablet Take 10 mg by mouth daily. 03/03/22   [provider]  senna (SENOKOT) 8.6 MG TABS tablet Take 1 tablet (8.6 mg total) by mouth in the morning and at bedtime. 04/28/22   Pickenpack-CousarCarlena Sax, NP    Physical Exam: Vitals:   07/17/22 0224 07/17/22 0226  BP:  137/77  Pulse: (!) 106   Resp: 20   Temp: 98.6 F (37 C)   TempSrc: Oral   SpO2: 100%    Physical Exam Vitals and nursing note reviewed.  Constitutional:      Appearance: He is obese.  HENT:     Head: Normocephalic.     Nose: No rhinorrhea.     Mouth/Throat:     Mouth: Mucous membranes are moist.  Eyes:     General: No scleral icterus.    Pupils: Pupils are equal, round, and reactive to light.  Cardiovascular:     Rate and Rhythm: Normal rate and regular rhythm.  Pulmonary:     Effort: Pulmonary effort is normal.     Breath sounds: Normal breath sounds.  Abdominal:     General: Abdomen is protuberant. Bowel sounds are normal. There is no distension.     Palpations: Abdomen is soft.     Tenderness: There is no abdominal tenderness. There is no right CVA tenderness, left CVA tenderness, guarding or rebound. Negative signs include Murphy's sign.     Comments: Have mild generalized tenderness earlier.  Recently medicated.  Denied tenderness at the time of my examination.  Musculoskeletal:     Cervical  back: Neck supple.     Right lower leg: No edema.     Left lower leg: No edema.  Skin:    General: Skin is warm and dry.  Neurological:  General: No focal deficit present.     Mental Status: He is alert and oriented to person, place, and time.  Psychiatric:        Mood and Affect: Mood normal.        Behavior: Behavior normal.     Data Reviewed:  Results are pending, will review when available.  Assessment and Plan: Principal Problem:   Acute appendicitis Inpatient/MedSurg. Continue IV fluids. Full liquid diet. Analgesics as needed. Antiemetics as needed. Resume pantoprazole 40 mg p.o. twice daily. Continue Zosyn 3.375 g IVPB every 8 hours. Follow CBC, CMP in AM. General surgery input appreciated. May discharge in 24 to 48 hours.  Active Problems:   Hyponatremia Secondary to HCTZ use/recent GI losses. Continue IV fluids. Hold hydrochlorothiazide. Follow-up sodium level.    Traumatic compression fracture of lumbar vertebra (HCC) No significant pain at the moment. Continue hydromorphone as needed. Continue MS Contin 30 mg every 12 hours. Begin muscle relaxants if exacerbated. Will need to reschedule appointment with physical therapy.    Multiple myeloma not having achieved remission ([place]) Follow-up at the cancer center with Dr. Lorenso Courier as scheduled.    Essential hypertension Continue lisinopril 40 mg p.o. daily. Holding diuretic for now. Follow-up BP, renal function and electrolytes.    GERD without esophagitis Continue PPI as above.    Mixed hyperlipidemia Continue rosuvastatin 10 mg p.o. daily.    Advance Care Planning:   Code Status: Full Code   Consults: General surgery (Dr. Barry Dienes).  Family Communication:   Severity of Illness: The appropriate patient status for this patient is INPATIENT. Inpatient status is judged to be reasonable and necessary in order to provide the required intensity of service to ensure the patient's safety. The patient's  presenting symptoms, physical exam findings, and initial radiographic and laboratory data in the context of their chronic comorbidities is felt to place them at high risk for further clinical deterioration. Furthermore, it is not anticipated that the patient will be medically stable for discharge from the hospital within 2 midnights of admission.   * I certify that at the point of admission it is my clinical judgment that the patient will require inpatient hospital care spanning beyond 2 midnights from the point of admission due to high intensity of service, high risk for further deterioration and high frequency of surveillance required.*  Author: Reubin Milan, MD 07/17/2022 7:07 AM  For on call review [email or web address].   This document was prepared using Dragon voice recognition software and may contain some unintended transcription errors.

## 2022-07-17 NOTE — Progress Notes (Signed)
BAMIDELE VILLASENOR Jun 16, 1970  [id number].    Requesting MD: Dr. Tennis Must Chief Complaint/Reason for Consult: Possible acute appendicitis   HPI: [name] is 52 y.o. male w/ hx IgG kappa multiple myeloma currently on VRD therapy followed by Dr. Narda Rutherford who presented to the ED for leg pain and abdominal pain. Patient reports his main complaint/issues is leg pain. 3 days ago started having ~3-4 episodes of diarrhea/day. Associated generalized abdominal pain since onset of diarrhea. Pain was not worse in one particular area. Pain was brought on only by po intake and last <5 minutes before resolving. No associated fever, chills, n/v, decreased appetite, hematochezia, melena, or urinary symptoms. Denies contacts with persons with similar symptoms, ingestion of suspect foods or water, history of similar symptoms, recent travel or recent abx use. Denies hx of similar symptoms in the past. Reports diarrhea stopped yesterday 2/21 and he had a normal bm yesterday before presentation. Was tolerating solid diet prior to presentation with last meal a plate of fish, collard greens and cabbage for dinner yesterday.   In ED he was afebrile without hypotension. HR 100's. WBC 8. No AKI. LFT's wnl. Lipase 52. CT A/P with 8m appendix with portions showing trace adjacent stranding. No perforation or abscess. Gallbladder contracted but without gross abnormality. No abnormality of pancreas. Patient reports complete resolution of his symptoms since presentation. No prn pain medication this am. He is not on blood thinners. No prior abdominal surgeries.   CT did also show evidence of Chronic Gastritis. Reports he already is on PPI, follows with GI and has f/u arranged with Dr. CBryan Lemma Last endoscopy and colonoscopy 05/30/21 (see below). Denies frequent nsaid use. Follows with palliative for pain control related to MM, L3 vertebral compression fraction s/p kyphoplasty (9/27) and IM nailing of left humeral shaft  (9/30). Denies tobacco use or alcohol use since dx of MM. No drug use.   ROS: ROS As above, see hpi  Family History  Problem Relation Age of Onset   Heart disease Mother    Hypertension Mother    Diabetes Father    Hypertension Father    Colon cancer Sister        passed away from colon cancer, in her 517s  Heart attack Maternal Uncle    Prostate cancer Maternal Uncle    Diabetes Other    Pancreatic cancer Neg Hx    Rectal cancer Neg Hx    Esophageal cancer Neg Hx    Stomach cancer Neg Hx     Past Medical History:  Diagnosis Date   Allergy    Anemia    Atypical chest pain    Cancer (HTice    Multiple myeloma with normocytic anemia   GERD (gastroesophageal reflux disease)    Hepatic cyst    Benign by MRI   Hiatal hernia    History of colonic polyps    Hyperlipidemia    Hypertension    Rectal bleeding    Tobacco abuse     Past Surgical History:  Procedure Laterality Date   COLONOSCOPY  01/10/2009     RMR: Normal rectum, normal colon; repeat in 2015 due to F[place]of colon cancer   COLONOSCOPY N/A 01/09/2014   [id number] polyps-removed as described   COLONOSCOPY N/A 09/18/2016   Procedure: COLONOSCOPY;  Surgeon: RDaneil Dolin MD;  [facility];  Service: Endoscopy;  Laterality: N/A;  730    ESOPHAGOGASTRODUODENOSCOPY  10/04/2007   RMR: Distal esophageal  erosions consistent with erosive reflux esophagitis, patulous gastroesophageal junction status post passage of a  Maloney dilator, 5 [place].  Otherwise, unremarkable esophagus.  Hiatal hernia.  Otherwise normal stomach.  Bulbar erosion   ESOPHAGOGASTRODUODENOSCOPY N/A 01/09/2014   Erosive reflux esophagitis. Small hiatal hernia   HUMERUS IM NAIL Left 02/22/2022   Procedure: INTRAMEDULLARY (IM) NAIL HUMERAL;  Surgeon: Nicholes Stairs, MD;  Location: [facility];  Service: Orthopedics;  Laterality: Left;   I & D EXTREMITY Left 07/26/2018   Procedure: IRRIGATION AND DEBRIDEMENT EXTREMITY;  Surgeon: Dayna Barker, MD;  Location: [facility];  Service: Plastics;  Laterality: Left;   IR BONE TUMOR(S)RF ABLATION  02/05/2022   IR BONE TUMOR(S)RF ABLATION  02/24/2022   IR KYPHO EA ADDL LEVEL THORACIC OR LUMBAR  02/19/2022   IR KYPHO LUMBAR INC FX REDUCE BONE BX UNI/BIL CANNULATION INC/IMAGING  02/05/2022   IR KYPHO LUMBAR INC FX REDUCE BONE BX UNI/BIL CANNULATION INC/IMAGING  02/19/2022   PERCUTANEOUS PINNING Left 07/26/2018   Procedure: PERCUTANEOUS PINNING EXTREMITY;  Surgeon: Dayna Barker, MD;  Location: [facility];  Service: Plastics;  Laterality: Left;    Social History:  reports that he has been smoking cigarettes. He has a 2.50 pack-year smoking history. He has never used smokeless tobacco. He reports current alcohol use. He reports that he does not use drugs.  Allergies: No Known Allergies  Medications Prior to Admission  Medication Sig Dispense Refill   acyclovir (ZOVIRAX) 400 MG tablet Take 1 tablet (400 mg total) by mouth 2 (two) times daily. 60 tablet 3   allopurinol (ZYLOPRIM) 300 MG tablet Take 1 tablet (300 mg total) by mouth daily. 30 tablet 3   aspirin EC 81 MG tablet Take 81 mg by mouth daily. Swallow whole.     cetirizine (ZYRTEC) 10 MG tablet Take 1 tablet by mouth daily.     DULoxetine (CYMBALTA) 20 MG capsule Take 1 capsule (20 mg total) by mouth daily. 30 capsule 1   gabapentin (NEURONTIN) 100 MG capsule Take 1 capsule (100 mg total) by mouth 2 (two) times daily. 60 capsule 1   hydrochlorothiazide (HYDRODIURIL) 25 MG tablet Take 25 mg by mouth daily.     lisinopril (ZESTRIL) 40 MG tablet Take 40 mg by mouth daily.     Methocarbamol 1000 MG TABS Take 1,000 mg by mouth every 6 (six) hours as needed. 45 tablet 1   morphine (MS CONTIN) 30 MG 12 hr tablet Take 1 tablet by mouth every 8 hours. 60 tablet 0   ondansetron (ZOFRAN) 8 MG tablet Take 1 tablet (8 mg total) by mouth every 8 (eight) hours as needed for nausea or vomiting. 60 tablet 3   pantoprazole (PROTONIX) 40 MG tablet Take 1 tablet  (40 mg total) by mouth 2 (two) times daily. 60 tablet 2   polyethylene glycol powder (GLYCOLAX/MIRALAX) 17 GM/SCOOP powder Take 1 capful (17 g) with water by mouth daily. 238 g 0   REVLIMID 25 MG capsule Take 1 capsule (25 mg total) by mouth daily. Take one capsule daily x 14 days. None for following 7 days 14 capsule 0   rOPINIRole (REQUIP) 0.25 MG tablet Take 1 tablet (0.25 mg total) by mouth at bedtime as needed for up to 10 days. 10 tablet 0   acetaminophen (TYLENOL) 500 MG tablet Take 1 tablet (500 mg total) by mouth every 8 (eight) hours as needed for mild pain or moderate pain. (Patient not taking: Reported on 07/16/2022) 30 tablet 0   clonazePAM (  KLONOPIN) 0.5 MG tablet Take 1 tablet (0.5 mg total) by mouth 2 (two) times daily as needed for anxiety. 15 tablet 0   diazepam (VALIUM) 5 MG tablet SMARTSIG:1-2 Tablet(s) By Mouth (Patient not taking: Reported on 07/16/2022)     docusate sodium (COLACE) 100 MG capsule Take 1 capsule (100 mg total) by mouth 2 (two) times daily as needed for mild constipation. (Patient not taking: Reported on 07/16/2022) 30 capsule 3   Oxycodone HCl 20 MG TABS Take 1 tablet (20 mg total) by mouth every 6 (six) hours as needed. (Patient not taking: Reported on 07/16/2022) 60 tablet 0   prochlorperazine (COMPAZINE) 10 MG tablet Take 1 tablet (10 mg total) by mouth every 6 (six) hours as needed for nausea or vomiting. (Patient not taking: Reported on 07/16/2022) 60 tablet 0   rosuvastatin (CRESTOR) 10 MG tablet Take 10 mg by mouth daily.     senna (SENOKOT) 8.6 MG TABS tablet Take 1 tablet (8.6 mg total) by mouth in the morning and at bedtime. 120 tablet 3     Physical Exam: Blood pressure 120/75, pulse 91, temperature 98.2 F (36.8 C), resp. rate 14, height 5' 9"$  (1.753 m), weight 98.6 kg, SpO2 100 %. General: pleasant, WD/WN male who is laying in bed in NAD HEENT: head is normocephalic, atraumatic.  Sclera are noninjected.  PERRL.  Ears and nose without any masses or  lesions.  Mouth is pink and moist. Dentition fair Heart: regular, rate, and rhythm Lungs: CTAB, no wheezes, rhonchi, or rales noted.  Respiratory effort nonlabored Abd:  Soft, ND, completely non-tender. No rigidity or guarding. +BS MS: no BUE or BLE edema Skin: warm and dry  Psych: A&Ox4 with an appropriate affect Neuro: cranial nerves grossly intact, normal speech, thought process intact, moves all extremities, gait not assessed  Results for orders placed or performed during the hospital encounter of 07/17/22 (from the past 48 hour(s))  CBC with Differential     Status: Abnormal   Collection Time: 07/17/22  3:13 AM  Result Value Ref Range   WBC 8.0 4.0 - 10.5 K/uL   RBC 4.28 4.22 - 5.81 MIL/uL   Hemoglobin 11.8 (L) 13.0 - 17.0 g/dL   HCT 36.4 (L) 39.0 - 52.0 %   MCV 85.0 80.0 - 100.0 fL   MCH 27.6 26.0 - 34.0 pg   MCHC 32.4 30.0 - 36.0 g/dL   RDW 14.3 11.5 - 15.5 %   Platelets 211 150 - 400 K/uL   nRBC 0.0 0.0 - 0.2 %   Neutrophils Relative % 73 %   Neutro Abs 5.9 1.7 - 7.7 K/uL   Lymphocytes Relative 17 %   Lymphs Abs 1.4 0.7 - 4.0 K/uL   Monocytes Relative 9 %   Monocytes Absolute 0.7 0.1 - 1.0 K/uL   Eosinophils Relative 0 %   Eosinophils Absolute 0.0 0.0 - 0.5 K/uL   Basophils Relative 0 %   Basophils Absolute 0.0 0.0 - 0.1 K/uL   Immature Granulocytes 1 %   Abs Immature Granulocytes 0.04 0.00 - 0.07 K/uL    Comment: Performed at [facility], [place] [address], [place], [place] [address]  Comprehensive metabolic panel     Status: Abnormal   Collection Time: 07/17/22  3:13 AM  Result Value Ref Range   Sodium 130 (L) 135 - 145 mmol/L   Potassium 4.6 3.5 - 5.1 mmol/L   Chloride 99 98 - 111 mmol/L   CO2 18 (L) 22 - 32 mmol/L  Glucose, Bld 113 (H) 70 - 99 mg/dL    Comment: Glucose reference range applies only to samples taken after fasting for at least 8 hours.   BUN 19 6 - 20 mg/dL   Creatinine, Ser 0.96 0.61 - 1.24 mg/dL   Calcium 9.6 8.9 - 10.3  mg/dL   Total Protein 7.3 6.5 - 8.1 g/dL   Albumin 4.0 3.5 - 5.0 g/dL   AST 21 15 - 41 U/L   ALT 15 0 - 44 U/L   Alkaline Phosphatase 60 38 - 126 U/L   Total Bilirubin 0.5 0.3 - 1.2 mg/dL   GFR, Estimated >60 >60 mL/min    Comment: (NOTE) Calculated using the CKD-EPI Creatinine Equation (2021)    Anion gap 13 5 - 15    Comment: Performed at [facility], [place] [address], [place], [place] [address]  Lipase, blood     Status: Abnormal   Collection Time: 07/17/22  3:13 AM  Result Value Ref Range   Lipase 52 (H) 11 - 51 U/L    Comment: Performed at [facility], [place] [address] [place], [place] [address]  Urinalysis, Routine w reflex microscopic -Urine, Clean Catch     Status: Abnormal   Collection Time: 07/17/22  3:13 AM  Result Value Ref Range   Color, Urine STRAW (A) YELLOW   APPearance CLEAR CLEAR   Specific Gravity, Urine 1.012 1.005 - 1.030   pH 5.0 5.0 - 8.0   Glucose, UA >=500 (A) NEGATIVE mg/dL   Hgb urine dipstick NEGATIVE NEGATIVE   Bilirubin Urine NEGATIVE NEGATIVE   Ketones, ur NEGATIVE NEGATIVE mg/dL   Protein, ur NEGATIVE NEGATIVE mg/dL   Nitrite NEGATIVE NEGATIVE   Leukocytes,Ua NEGATIVE NEGATIVE   RBC / HPF 0-5 0 - 5 RBC/hpf   WBC, UA 0-5 0 - 5 WBC/hpf   Bacteria, UA NONE SEEN NONE SEEN   Squamous Epithelial / HPF 0-5 0 - 5 /HPF    Comment: Performed at [facility], [place] [address] [place], [place] [address]   CT ABDOMEN PELVIS W CONTRAST  Result Date: 07/17/2022 CLINICAL DATA:  Generalized abdominal pain. Low back pain. History of multiple myeloma with multilevel thoracic and lumbar kyphoplasty . EXAM: CT ABDOMEN AND PELVIS WITH CONTRAST TECHNIQUE: Multidetector CT imaging of the abdomen and pelvis was performed using the standard protocol following bolus administration of intravenous contrast. RADIATION DOSE REDUCTION: This exam was performed according to the departmental dose-optimization program which  includes automated exposure control, adjustment of the mA and/or kV according to patient size and/or use of iterative reconstruction technique. CONTRAST:  136m OMNIPAQUE IOHEXOL 300 MG/ML  SOLN COMPARISON:  Similar study of 07/21/2021. FINDINGS: Lower chest: Minimal posterior atelectasis. Lung bases are otherwise clear. The cardiac size is normal. Hepatobiliary: Stable 1.6 cm cyst in segment 2 of the left lobe. The liver is 19 cm length mildly steatotic without mass enhancement. Gallbladder is contracted but grossly normal. No biliary dilatation. The hepatic portal main vein again measures prominent at 17 mm. Pancreas: No abnormality. Spleen: No abnormality.  No splenomegaly. Adrenals/Urinary Tract: Adrenal glands are unremarkable. Kidneys are normal, without renal calculi, focal lesion, or hydronephrosis. Bladder is unremarkable. Stomach/Bowel: There is moderate chronic fold thickening in the stomach. Probable chronic gastritis. Endoscopy may be indicated to exclude underlying ulcerative component but this is grossly unchanged. No small bowel dilatation or wall thickening is seen. The appendix is increasingly prominent in caliber measuring 11 mm concerning for appendicitis with portions  showing trace adjacent stranding. No appendiceal rupture or abscess is seen. There is moderate stool retention. There are colonic diverticula without evidence for diverticulitis. Vascular/Lymphatic: Anterior to L2 there is a linear radiopacity extending from the anterior vertebral body over to the right and into the lumen of the IVC. This is probably venously intravasated kyphoplasty cement as there appeared to be a cortical vein from L2 in this location emptying into the IVC previously. No other significant vascular findings are seen. No enlarged abdominal, pelvic or inguinal lymph nodes. Reproductive: Prostate is unremarkable. Other: No abdominal wall hernia or abnormality. No abdominopelvic ascites. Musculoskeletal: There is a  marked interval change in the bones compared to the prior study. There is new demonstration of diffuse demineralization and heterogeneity in the lower ribs, visualized spine, pelvis and sacrum with innumerable lucent lesions and with vertebral body wedge compression fractures now seen from T10 through L4, moderate at T10, T12, L3 and L4 and relatively milder L1 and L2, with preservation of the normal vertebral height of L5. Kyphoplasty cement has been injected at T10, T 12, L1, L2, L3 and 4. Age of the compression fractures is not known but none of this was present 1 year ago. IMPRESSION: 1. The appendix is increasingly prominent in caliber measuring up to 11 mm with portions showing trace adjacent stranding. Findings concerning for acute appendicitis. No appendiceal rupture or abscess is seen. This result was phoned to Dr. Randal Buba at 5:07 a.m., 07/17/2022. 2. Constipation and diverticulosis. 3. Interval demonstration of diffuse demineralization and heterogeneity in the lower ribs, visualized spine, pelvis and sacrum with innumerable lucent lesions and multiple vertebral body wedge compression fractures. Age of the fractures is not known but none of this was present 1 year ago. Multilevel kyphoplasty. 4. Linear radiopacity extending from the anterior L2 vertebral body into the lumen of the IVC. This is probably venously intravasated kyphoplasty cement. 5. Mildly prominent liver with mild steatosis and stable 1.6 cm cyst in segment 2 of the left lobe. 6. Chronic gastritis. Endoscopy may be indicated to exclude underlying ulcerative component but this is grossly unchanged. Electronically Signed   By: Telford Nab M.D.   On: 07/17/2022 05:16    Upper Endoscopy 05/30/21  A single area of ectopic gastric mucosa was found in the upper third of the esophagus.The upper third of the esophagus, middle third of the esophagus and lower third of the esophagus were otherwise normal. Given the symptoms of solid food dysphagia,  the decision was made to perform empiric esophageal dilation. The scope was withdrawn. Dilation was performed with a Maloney dilator with no resistance at 13 Fr. The dilation site was examined following endoscope reinsertion and showed no bleeding, mucosal tear or perforation. Biopsies were obtained from the proximal and distal esophagus with cold forceps for histology of suspected eosinophilic esophagitis. The Z-line was irregular and was found 38 cm from the incisors, charactized by a 0.5 cm segment of circumferential salmon colored mucosa and increased capillary visualization. This was visualized with standard white light and narrow band imaging (NBI). Biopsies were taken with a cold forceps for histology.  Scattered mild inflammation characterized by erythema was found in the gastric fundus and in the gastric body. Biopsies were taken with a cold forceps for Helicobacter pylori testing.  The examined duodenum was normal. Biopsies were taken with a cold forceps for histology.  Colonoscopy 05/30/21 - Two 3 to 4 mm polyps in the ascending colon, removed with a cold snare. Resected and retrieved.  -  Two 3 to 4 mm polyps in the sigmoid colon, removed with a cold snare. Resected and retrieved. - The distal rectum and anal verge are normal on retroflexion view. - The examined portion of the ileum was normal.   Anti-infectives (From admission, onward)    Start     Dose/Rate Route Frequency Ordered Stop   07/17/22 1200  piperacillin-tazobactam (ZOSYN) IVPB 3.375 g        3.375 g 12.5 mL/hr over 240 Minutes Intravenous Every 8 hours 07/17/22 0636     07/17/22 0515  piperacillin-tazobactam (ZOSYN) IVPB 3.375 g        3.375 g 100 mL/hr over 30 Minutes Intravenous  Once 07/17/22 0515 07/17/22 [id number]       Assessment/Plan Possible Acute Appendicitis Patient has been seen and examined. Vitals, labs, I/O, imaging and available notes reviewed. This is a 52 year old male with hx of MM currently on VRD  therapy who we were asked to see for abdominal pain. His CT shows appendix measuring up to 11 mm with portions showing trace adjacent stranding. No perforation or abscess. His presentation is atypical for acute appendicitis. VSS without fever or hypotension. Tachycardia resolved. WBC wnl. His abdominal pain has completely resolved today and he is completely NT on exam. We discussed operative vs non-operative management. Given atypical presentation, his resolution of symptoms, reassuring vitals/labs and exam, patient is in agreement with trial of conservative management with abx. Will advance diet as tolerated. He may be discahrged later today if tolerates diet advancement. He does not need direct follow up with our office but understands if he develops any concerning symptoms (fever, n/v, new/worsening abdominal pain) he is to present back to the ED for re-evaluation. I discussed plan with [facility] over the phone.   FEN - ADAT, IVF per [facility] VTE - SCDs, okay for chemical ppx from a general surgery standpoint ID - On Zosyn. Can transition to Augmentin for 7d  I reviewed nursing notes, ED provider notes, hospitalist notes, last 24 h vitals and pain scores, last 48 h intake and output, last 24 h labs and trends, and last 24 h imaging results.  Jillyn Ledger, [facility] 07/17/2022, 9:16 AM Please see Amion for pager number during day hours 7:00am-4:30pm

## 2022-07-17 NOTE — ED Notes (Signed)
ED TO INPATIENT HANDOFF REPORT  ED Nurse Name and Phone #: Ronalee Belts [phone]  S Name/Age/Gender [name] 52 y.o. male Room/Bed: WA11/WA11  Code Status   Code Status: Prior  Home/SNF/Other Home Patient oriented to: self, place, time, and situation Is this baseline? Yes   Triage Complete: Triage complete  Chief Complaint Acute appendicitis [K35.80]  Triage Note Pt c/o generalized abdominal pain, nausea, lower back pain and restless leg pain that is keeping him up.    Allergies No Known Allergies  Level of Care/Admitting Diagnosis ED Disposition     ED Disposition  Admit   Condition  --   Comment  Hospital Area: [facility] [[id number]]  Level of Care: Med-Surg [16]  May admit patient to [facility] or [facility] if equivalent level of care is available:: No  Covid Evaluation: Asymptomatic - no recent exposure (last 10 days) testing not required  Diagnosis: Acute appendicitis [id number]  Admitting Physician: Vianne Bulls [id number]  Attending Physician: Vianne Bulls [id number]  Certification:: I certify this patient will need inpatient services for at least 2 midnights  Estimated Length of Stay: 3          B Medical/Surgery History Past Medical History:  Diagnosis Date   Allergy    Anemia    Atypical chest pain    Cancer ([place])    Multiple myeloma with normocytic anemia   GERD (gastroesophageal reflux disease)    Hepatic cyst    Benign by MRI   Hiatal hernia    History of colonic polyps    Hyperlipidemia    Hypertension    Rectal bleeding    Tobacco abuse    Past Surgical History:  Procedure Laterality Date   COLONOSCOPY  01/10/2009     RMR: Normal rectum, normal colon; repeat in 2015 due to [place] of colon cancer   COLONOSCOPY N/A 01/09/2014   [id number] colonic polyps-removed as described   COLONOSCOPY N/A 09/18/2016   Procedure: COLONOSCOPY;  Surgeon: Daneil Dolin, MD;  [facility];  Service: Endoscopy;   Laterality: N/A;  730    ESOPHAGOGASTRODUODENOSCOPY  10/04/2007   RMR: Distal esophageal erosions consistent with erosive reflux esophagitis, patulous gastroesophageal junction status post passage of a  [place] dilator, [address] [place].  Otherwise, unremarkable esophagus.  Hiatal hernia.  Otherwise normal stomach.  Bulbar erosion   ESOPHAGOGASTRODUODENOSCOPY N/A 01/09/2014   Erosive reflux esophagitis. Small hiatal hernia   HUMERUS IM NAIL Left 02/22/2022   Procedure: INTRAMEDULLARY (IM) NAIL HUMERAL;  Surgeon: Nicholes Stairs, MD;  Location: [facility];  Service: Orthopedics;  Laterality: Left;   I & D EXTREMITY Left 07/26/2018   Procedure: IRRIGATION AND DEBRIDEMENT EXTREMITY;  Surgeon: Dayna Barker, MD;  Location: [facility];  Service: Plastics;  Laterality: Left;   IR BONE TUMOR(S)RF ABLATION  02/05/2022   IR BONE TUMOR(S)RF ABLATION  02/24/2022   IR KYPHO EA ADDL LEVEL THORACIC OR LUMBAR  02/19/2022   IR KYPHO LUMBAR INC FX REDUCE BONE BX UNI/BIL CANNULATION INC/IMAGING  02/05/2022   IR KYPHO LUMBAR INC FX REDUCE BONE BX UNI/BIL CANNULATION INC/IMAGING  02/19/2022   PERCUTANEOUS PINNING Left 07/26/2018   Procedure: PERCUTANEOUS PINNING EXTREMITY;  Surgeon: Dayna Barker, MD;  Location: [facility];  Service: Plastics;  Laterality: Left;     A IV Location/Drains/Wounds Patient Lines/Drains/Airways Status     Active Line/Drains/Airways     Name Placement date Placement time Site Days   Peripheral IV 07/17/22 20 G 1" Left;Posterior Wrist  07/17/22  0314  Wrist  less than 1            Intake/Output Last 24 hours  Intake/Output Summary (Last 24 hours) at 07/17/2022 [id number] Last data filed at 07/17/2022 [id number] Gross per 24 hour  Intake 50 ml  Output --  Net 50 ml    Labs/Imaging Results for orders placed or performed during the hospital encounter of 07/17/22 (from the past 48 hour(s))  CBC with Differential     Status: Abnormal   Collection Time: 07/17/22  3:13 AM  Result Value Ref Range   WBC 8.0  4.0 - 10.5 K/uL   RBC 4.28 4.22 - 5.81 MIL/uL   Hemoglobin 11.8 (L) 13.0 - 17.0 g/dL   HCT 36.4 (L) 39.0 - 52.0 %   MCV 85.0 80.0 - 100.0 fL   MCH 27.6 26.0 - 34.0 pg   MCHC 32.4 30.0 - 36.0 g/dL   RDW 14.3 11.5 - 15.5 %   Platelets 211 150 - 400 K/uL   nRBC 0.0 0.0 - 0.2 %   Neutrophils Relative % 73 %   Neutro Abs 5.9 1.7 - 7.7 K/uL   Lymphocytes Relative 17 %   Lymphs Abs 1.4 0.7 - 4.0 K/uL   Monocytes Relative 9 %   Monocytes Absolute 0.7 0.1 - 1.0 K/uL   Eosinophils Relative 0 %   Eosinophils Absolute 0.0 0.0 - 0.5 K/uL   Basophils Relative 0 %   Basophils Absolute 0.0 0.0 - 0.1 K/uL   Immature Granulocytes 1 %   Abs Immature Granulocytes 0.04 0.00 - 0.07 K/uL    Comment: Performed at [facility], [place] [address] [place], [place] [address]  Comprehensive metabolic panel     Status: Abnormal   Collection Time: 07/17/22  3:13 AM  Result Value Ref Range   Sodium 130 (L) 135 - 145 mmol/L   Potassium 4.6 3.5 - 5.1 mmol/L   Chloride 99 98 - 111 mmol/L   CO2 18 (L) 22 - 32 mmol/L   Glucose, Bld 113 (H) 70 - 99 mg/dL    Comment: Glucose reference range applies only to samples taken after fasting for at least 8 hours.   BUN 19 6 - 20 mg/dL   Creatinine, Ser 0.96 0.61 - 1.24 mg/dL   Calcium 9.6 8.9 - 10.3 mg/dL   Total Protein 7.3 6.5 - 8.1 g/dL   Albumin 4.0 3.5 - 5.0 g/dL   AST 21 15 - 41 U/L   ALT 15 0 - 44 U/L   Alkaline Phosphatase 60 38 - 126 U/L   Total Bilirubin 0.5 0.3 - 1.2 mg/dL   GFR, Estimated >60 >60 mL/min    Comment: (NOTE) Calculated using the CKD-EPI Creatinine Equation (2021)    Anion gap 13 5 - 15    Comment: Performed at [facility], [place] [address], [place], [place] [address]  Lipase, blood     Status: Abnormal   Collection Time: 07/17/22  3:13 AM  Result Value Ref Range   Lipase 52 (H) 11 - 51 U/L    Comment: Performed at [facility], [place] [address] [place], [place] [address]  Urinalysis,  Routine w reflex microscopic -Urine, Clean Catch     Status: Abnormal   Collection Time: 07/17/22  3:13 AM  Result Value Ref Range   Color, Urine STRAW (A) YELLOW   APPearance CLEAR CLEAR   Specific Gravity, Urine 1.012 1.005 - 1.030   pH 5.0 5.0 -  8.0   Glucose, UA >=500 (A) NEGATIVE mg/dL   Hgb urine dipstick NEGATIVE NEGATIVE   Bilirubin Urine NEGATIVE NEGATIVE   Ketones, ur NEGATIVE NEGATIVE mg/dL   Protein, ur NEGATIVE NEGATIVE mg/dL   Nitrite NEGATIVE NEGATIVE   Leukocytes,Ua NEGATIVE NEGATIVE   RBC / HPF 0-5 0 - 5 RBC/hpf   WBC, UA 0-5 0 - 5 WBC/hpf   Bacteria, UA NONE SEEN NONE SEEN   Squamous Epithelial / HPF 0-5 0 - 5 /HPF    Comment: Performed at [facility], [place] [address] [place], [place] [address]   CT ABDOMEN PELVIS W CONTRAST  Result Date: 07/17/2022 CLINICAL DATA:  Generalized abdominal pain. Low back pain. History of multiple myeloma with multilevel thoracic and lumbar kyphoplasty . EXAM: CT ABDOMEN AND PELVIS WITH CONTRAST TECHNIQUE: Multidetector CT imaging of the abdomen and pelvis was performed using the standard protocol following bolus administration of intravenous contrast. RADIATION DOSE REDUCTION: This exam was performed according to the departmental dose-optimization program which includes automated exposure control, adjustment of the mA and/or kV according to patient size and/or use of iterative reconstruction technique. CONTRAST:  172m OMNIPAQUE IOHEXOL 300 MG/ML  SOLN COMPARISON:  Similar study of 07/21/2021. FINDINGS: Lower chest: Minimal posterior atelectasis. Lung bases are otherwise clear. The cardiac size is normal. Hepatobiliary: Stable 1.6 cm cyst in segment 2 of the left lobe. The liver is 19 cm length mildly steatotic without mass enhancement. Gallbladder is contracted but grossly normal. No biliary dilatation. The hepatic portal main vein again measures prominent at 17 mm. Pancreas: No abnormality. Spleen: No abnormality.  No  splenomegaly. Adrenals/Urinary Tract: Adrenal glands are unremarkable. Kidneys are normal, without renal calculi, focal lesion, or hydronephrosis. Bladder is unremarkable. Stomach/Bowel: There is moderate chronic fold thickening in the stomach. Probable chronic gastritis. Endoscopy may be indicated to exclude underlying ulcerative component but this is grossly unchanged. No small bowel dilatation or wall thickening is seen. The appendix is increasingly prominent in caliber measuring 11 mm concerning for appendicitis with portions showing trace adjacent stranding. No appendiceal rupture or abscess is seen. There is moderate stool retention. There are colonic diverticula without evidence for diverticulitis. Vascular/Lymphatic: Anterior to L2 there is a linear radiopacity extending from the anterior vertebral body over to the right and into the lumen of the IVC. This is probably venously intravasated kyphoplasty cement as there appeared to be a cortical vein from L2 in this location emptying into the IVC previously. No other significant vascular findings are seen. No enlarged abdominal, pelvic or inguinal lymph nodes. Reproductive: Prostate is unremarkable. Other: No abdominal wall hernia or abnormality. No abdominopelvic ascites. Musculoskeletal: There is a marked interval change in the bones compared to the prior study. There is new demonstration of diffuse demineralization and heterogeneity in the lower ribs, visualized spine, pelvis and sacrum with innumerable lucent lesions and with vertebral body wedge compression fractures now seen from T10 through L4, moderate at T10, T12, L3 and L4 and relatively milder L1 and L2, with preservation of the normal vertebral height of L5. Kyphoplasty cement has been injected at T10, T 12, L1, L2, L3 and 4. Age of the compression fractures is not known but none of this was present 1 year ago. IMPRESSION: 1. The appendix is increasingly prominent in caliber measuring up to 11 mm  with portions showing trace adjacent stranding. Findings concerning for acute appendicitis. No appendiceal rupture or abscess is seen. This result was phoned to Dr. PRandal Bubaat 5:07 a.m., 07/17/2022. 2.  Constipation and diverticulosis. 3. Interval demonstration of diffuse demineralization and heterogeneity in the lower ribs, visualized spine, pelvis and sacrum with innumerable lucent lesions and multiple vertebral body wedge compression fractures. Age of the fractures is not known but none of this was present 1 year ago. Multilevel kyphoplasty. 4. Linear radiopacity extending from the anterior L2 vertebral body into the lumen of the IVC. This is probably venously intravasated kyphoplasty cement. 5. Mildly prominent liver with mild steatosis and stable 1.6 cm cyst in segment 2 of the left lobe. 6. Chronic gastritis. Endoscopy may be indicated to exclude underlying ulcerative component but this is grossly unchanged. Electronically Signed   By: Telford Nab M.D.   On: 07/17/2022 05:16    Pending Labs Unresulted Labs (From admission, onward)    None       Vitals/Pain Today's Vitals   07/17/22 0313 07/17/22 0651 07/17/22 0708 07/17/22 0710  BP:    101/62  Pulse:    86  Resp:    18  Temp:    97.8 F (36.6 C)  TempSrc:    Oral  SpO2:    98%  PainSc: 8  6  7      $ Isolation Precautions No active isolations  Medications Medications  sodium chloride (PF) 0.9 % injection (has no administration in time range)  0.9 %  sodium chloride infusion ( Intravenous New Bag/Given 07/17/22 0524)  HYDROmorphone (DILAUDID) injection 0.5-1 mg (has no administration in time range)  ondansetron (ZOFRAN) injection 4 mg (has no administration in time range)  piperacillin-tazobactam (ZOSYN) IVPB 3.375 g (has no administration in time range)  droperidol (INAPSINE) 2.5 MG/ML injection 1.25 mg (1.25 mg Intravenous Given 07/17/22 0320)  iohexol (OMNIPAQUE) 300 MG/ML solution 100 mL (100 mLs Intravenous Contrast Given  07/17/22 0424)  piperacillin-tazobactam (ZOSYN) IVPB 3.375 g (0 g Intravenous Stopped 07/17/22 0624)  fentaNYL (SUBLIMAZE) injection 50 mcg (50 mcg Intravenous Given 07/17/22 [id number])    Mobility walks     Focused Assessments Apendicitis   R Recommendations: See Admitting Provider Note  Report given to:   Additional Notes: .

## 2022-07-17 NOTE — ED Provider Notes (Signed)
[place] [facility] Provider Note   CSN: [id number] Arrival date & time: 07/17/22  [id number]     History  Chief Complaint  Patient presents with   Abdominal Pain    [name] is a 52 y.o. male.  The history is provided by the patient.  Abdominal Pain Pain location:  Generalized Pain radiates to:  Does not radiate Pain severity:  Severe Onset quality:  Gradual Duration:  3 days Timing:  Constant Progression:  Worsening Chronicity:  New Context: not trauma   Relieved by:  Nothing Worsened by:  Nothing Ineffective treatments:  None tried Associated symptoms: diarrhea, nausea and vomiting   Associated symptoms: no fever   Patient being treated for multiple myeloma presents with abdominal pain with nausea and vomiting and 4 episodes of diarrhea and ongoing issues with RLS as well as pain post kyphoplasty   Past Medical History:  Diagnosis Date   Allergy    Anemia    Atypical chest pain    Cancer (HCC)    Multiple myeloma with normocytic anemia   GERD (gastroesophageal reflux disease)    Hepatic cyst    Benign by MRI   Hiatal hernia    History of colonic polyps    Hyperlipidemia    Hypertension    Rectal bleeding    Tobacco abuse         Home Medications Prior to Admission medications   Medication Sig Start Date End Date Taking? Authorizing Provider  acetaminophen (TYLENOL) 500 MG tablet Take 1 tablet (500 mg total) by mouth every 8 (eight) hours as needed for mild pain or moderate pain. Patient not taking: Reported on 07/16/2022 02/24/22   Thurnell Lose, MD  acyclovir (ZOVIRAX) 400 MG tablet Take 1 tablet (400 mg total) by mouth 2 (two) times daily. 02/26/22   Lincoln Brigham, PA-C  allopurinol (ZYLOPRIM) 300 MG tablet Take 1 tablet (300 mg total) by mouth daily. 07/09/22   Pickenpack-Cousar, Carlena Sax, NP  aspirin EC 81 MG tablet Take 81 mg by mouth daily. Swallow whole.    [provider]  cetirizine (ZYRTEC)  10 MG tablet Take 1 tablet by mouth daily.    [provider]  clonazePAM (KLONOPIN) 0.5 MG tablet Take 1 tablet (0.5 mg total) by mouth 2 (two) times daily as needed for anxiety. 04/22/22   [name], MD  diazepam (VALIUM) 5 MG tablet SMARTSIG:1-2 Tablet(s) By Mouth Patient not taking: Reported on 07/16/2022 05/15/22   [provider]  docusate sodium (COLACE) 100 MG capsule Take 1 capsule (100 mg total) by mouth 2 (two) times daily as needed for mild constipation. Patient not taking: Reported on 07/16/2022 07/01/22   Pickenpack-Cousar, Carlena Sax, NP  DULoxetine (CYMBALTA) 20 MG capsule Take 1 capsule (20 mg total) by mouth daily. 04/22/22   [name], MD  gabapentin (NEURONTIN) 100 MG capsule Take 1 capsule (100 mg total) by mouth 2 (two) times daily. 04/02/22   Pickenpack-Cousar, Carlena Sax, NP  hydrochlorothiazide (HYDRODIURIL) 25 MG tablet Take 25 mg by mouth daily. 05/01/22   [provider]  lisinopril (ZESTRIL) 40 MG tablet Take 40 mg by mouth daily. 04/27/22   [provider]  Methocarbamol 1000 MG TABS Take 1,000 mg by mouth every 6 (six) hours as needed. 04/02/22   Pickenpack-Cousar, Carlena Sax, NP  morphine (MS CONTIN) 30 MG 12 hr tablet Take 1 tablet by mouth every 8 hours. 07/15/22   Pickenpack-Cousar, Carlena Sax, NP  ondansetron (ZOFRAN) 8 MG tablet Take 1 tablet (8 mg total) by mouth every 8 (eight) hours as needed for nausea or vomiting. 07/01/22   Pickenpack-Cousar, Carlena Sax, NP  Oxycodone HCl 20 MG TABS Take 1 tablet (20 mg total) by mouth every 6 (six) hours as needed. Patient not taking: Reported on 07/16/2022 04/03/22   Pickenpack-Cousar, Carlena Sax, NP  pantoprazole (PROTONIX) 40 MG tablet Take 1 tablet (40 mg total) by mouth 2 (two) times daily. 04/02/22   Pickenpack-Cousar, Carlena Sax, NP  polyethylene glycol powder (GLYCOLAX/MIRALAX) 17 GM/SCOOP powder Take 1 capful (17 g) with water by mouth daily. 02/24/22   Thurnell Lose, MD  prochlorperazine  (COMPAZINE) 10 MG tablet Take 1 tablet (10 mg total) by mouth every 6 (six) hours as needed for nausea or vomiting. Patient not taking: Reported on 07/16/2022 07/02/22   Orson Slick, MD  REVLIMID 25 MG capsule Take 1 capsule (25 mg total) by mouth daily. Take one capsule daily x 14 days. None for following 7 days 07/09/22   Orson Slick, MD  rOPINIRole (REQUIP) 0.25 MG tablet Take 1 tablet (0.25 mg total) by mouth at bedtime as needed for up to 10 days. 07/15/22 07/25/22  Small, Brooke L, PA  rosuvastatin (CRESTOR) 10 MG tablet Take 10 mg by mouth daily. 03/03/22   [provider]  senna (SENOKOT) 8.6 MG TABS tablet Take 1 tablet (8.6 mg total) by mouth in the morning and at bedtime. 04/28/22   Pickenpack-Cousar, Carlena Sax, NP      Allergies    Patient has no known allergies.    Review of Systems   Review of Systems  Constitutional:  Negative for fever.  HENT:  Negative for facial swelling.   Eyes:  Negative for redness.  Respiratory:  Negative for wheezing and stridor.   Gastrointestinal:  Positive for abdominal pain, diarrhea, nausea and vomiting.  All other systems reviewed and are negative.   Physical Exam Updated Vital Signs BP 137/77   Pulse (!) 106   Temp 98.6 F (37 C) (Oral)   Resp 20   SpO2 100%  Physical Exam Vitals and nursing note reviewed. Exam conducted with a chaperone present.  Constitutional:      General: He is not in acute distress.    Appearance: He is well-developed. He is not diaphoretic.  HENT:     Head: Normocephalic and atraumatic.     Nose: Nose normal.  Eyes:     Conjunctiva/sclera: Conjunctivae normal.     Pupils: Pupils are equal, round, and reactive to light.  Cardiovascular:     Rate and Rhythm: Normal rate and regular rhythm.  Pulmonary:     Effort: Pulmonary effort is normal.     Breath sounds: Normal breath sounds. No wheezing or rales.  Abdominal:     General: Bowel sounds are normal.     Palpations: Abdomen is soft.      Tenderness: There is abdominal tenderness. There is no guarding or rebound.  Musculoskeletal:        General: Normal range of motion.     Cervical back: Normal range of motion and neck supple.  Skin:    General: Skin is warm and dry.  Neurological:     Mental Status: He is alert and oriented to person, place, and time.     ED Results / Procedures / Treatments   Labs (all labs ordered are listed, but only abnormal results are displayed) Results for orders placed  or performed during the hospital encounter of 07/17/22  CBC with Differential  Result Value Ref Range   WBC 8.0 4.0 - 10.5 K/uL   RBC 4.28 4.22 - 5.81 MIL/uL   Hemoglobin 11.8 (L) 13.0 - 17.0 g/dL   HCT 36.4 (L) 39.0 - 52.0 %   MCV 85.0 80.0 - 100.0 fL   MCH 27.6 26.0 - 34.0 pg   MCHC 32.4 30.0 - 36.0 g/dL   RDW 14.3 11.5 - 15.5 %   Platelets 211 150 - 400 K/uL   nRBC 0.0 0.0 - 0.2 %   Neutrophils Relative % 73 %   Neutro Abs 5.9 1.7 - 7.7 K/uL   Lymphocytes Relative 17 %   Lymphs Abs 1.4 0.7 - 4.0 K/uL   Monocytes Relative 9 %   Monocytes Absolute 0.7 0.1 - 1.0 K/uL   Eosinophils Relative 0 %   Eosinophils Absolute 0.0 0.0 - 0.5 K/uL   Basophils Relative 0 %   Basophils Absolute 0.0 0.0 - 0.1 K/uL   Immature Granulocytes 1 %   Abs Immature Granulocytes 0.04 0.00 - 0.07 K/uL  Comprehensive metabolic panel  Result Value Ref Range   Sodium 130 (L) 135 - 145 mmol/L   Potassium 4.6 3.5 - 5.1 mmol/L   Chloride 99 98 - 111 mmol/L   CO2 18 (L) 22 - 32 mmol/L   Glucose, Bld 113 (H) 70 - 99 mg/dL   BUN 19 6 - 20 mg/dL   Creatinine, Ser 0.96 0.61 - 1.24 mg/dL   Calcium 9.6 8.9 - 10.3 mg/dL   Total Protein 7.3 6.5 - 8.1 g/dL   Albumin 4.0 3.5 - 5.0 g/dL   AST 21 15 - 41 U/L   ALT 15 0 - 44 U/L   Alkaline Phosphatase 60 38 - 126 U/L   Total Bilirubin 0.5 0.3 - 1.2 mg/dL   GFR, Estimated >60 >60 mL/min   Anion gap 13 5 - 15  Lipase, blood  Result Value Ref Range   Lipase 52 (H) 11 - 51 U/L  Urinalysis, Routine w  reflex microscopic -Urine, Clean Catch  Result Value Ref Range   Color, Urine STRAW (A) YELLOW   APPearance CLEAR CLEAR   Specific Gravity, Urine 1.012 1.005 - 1.030   pH 5.0 5.0 - 8.0   Glucose, UA >=500 (A) NEGATIVE mg/dL   Hgb urine dipstick NEGATIVE NEGATIVE   Bilirubin Urine NEGATIVE NEGATIVE   Ketones, ur NEGATIVE NEGATIVE mg/dL   Protein, ur NEGATIVE NEGATIVE mg/dL   Nitrite NEGATIVE NEGATIVE   Leukocytes,Ua NEGATIVE NEGATIVE   RBC / HPF 0-5 0 - 5 RBC/hpf   WBC, UA 0-5 0 - 5 WBC/hpf   Bacteria, UA NONE SEEN NONE SEEN   Squamous Epithelial / HPF 0-5 0 - 5 /HPF   CT ABDOMEN PELVIS W CONTRAST  Result Date: 07/17/2022 CLINICAL DATA:  Generalized abdominal pain. Low back pain. History of multiple myeloma with multilevel thoracic and lumbar kyphoplasty . EXAM: CT ABDOMEN AND PELVIS WITH CONTRAST TECHNIQUE: Multidetector CT imaging of the abdomen and pelvis was performed using the standard protocol following bolus administration of intravenous contrast. RADIATION DOSE REDUCTION: This exam was performed according to the departmental dose-optimization program which includes automated exposure control, adjustment of the mA and/or kV according to patient size and/or use of iterative reconstruction technique. CONTRAST:  169m OMNIPAQUE IOHEXOL 300 MG/ML  SOLN COMPARISON:  Similar study of 07/21/2021. FINDINGS: Lower chest: Minimal posterior atelectasis. Lung bases are otherwise clear. The cardiac size  is normal. Hepatobiliary: Stable 1.6 cm cyst in segment 2 of the left lobe. The liver is 19 cm length mildly steatotic without mass enhancement. Gallbladder is contracted but grossly normal. No biliary dilatation. The hepatic portal main vein again measures prominent at 17 mm. Pancreas: No abnormality. Spleen: No abnormality.  No splenomegaly. Adrenals/Urinary Tract: Adrenal glands are unremarkable. Kidneys are normal, without renal calculi, focal lesion, or hydronephrosis. Bladder is unremarkable.  Stomach/Bowel: There is moderate chronic fold thickening in the stomach. Probable chronic gastritis. Endoscopy may be indicated to exclude underlying ulcerative component but this is grossly unchanged. No small bowel dilatation or wall thickening is seen. The appendix is increasingly prominent in caliber measuring 11 mm concerning for appendicitis with portions showing trace adjacent stranding. No appendiceal rupture or abscess is seen. There is moderate stool retention. There are colonic diverticula without evidence for diverticulitis. Vascular/Lymphatic: Anterior to L2 there is a linear radiopacity extending from the anterior vertebral body over to the right and into the lumen of the IVC. This is probably venously intravasated kyphoplasty cement as there appeared to be a cortical vein from L2 in this location emptying into the IVC previously. No other significant vascular findings are seen. No enlarged abdominal, pelvic or inguinal lymph nodes. Reproductive: Prostate is unremarkable. Other: No abdominal wall hernia or abnormality. No abdominopelvic ascites. Musculoskeletal: There is a marked interval change in the bones compared to the prior study. There is new demonstration of diffuse demineralization and heterogeneity in the lower ribs, visualized spine, pelvis and sacrum with innumerable lucent lesions and with vertebral body wedge compression fractures now seen from T10 through L4, moderate at T10, T12, L3 and L4 and relatively milder L1 and L2, with preservation of the normal vertebral height of L5. Kyphoplasty cement has been injected at T10, T 12, L1, L2, L3 and 4. Age of the compression fractures is not known but none of this was present 1 year ago. IMPRESSION: 1. The appendix is increasingly prominent in caliber measuring up to 11 mm with portions showing trace adjacent stranding. Findings concerning for acute appendicitis. No appendiceal rupture or abscess is seen. This result was phoned to Dr. Randal Buba  at 5:07 a.m., 07/17/2022. 2. Constipation and diverticulosis. 3. Interval demonstration of diffuse demineralization and heterogeneity in the lower ribs, visualized spine, pelvis and sacrum with innumerable lucent lesions and multiple vertebral body wedge compression fractures. Age of the fractures is not known but none of this was present 1 year ago. Multilevel kyphoplasty. 4. Linear radiopacity extending from the anterior L2 vertebral body into the lumen of the IVC. This is probably venously intravasated kyphoplasty cement. 5. Mildly prominent liver with mild steatosis and stable 1.6 cm cyst in segment 2 of the left lobe. 6. Chronic gastritis. Endoscopy may be indicated to exclude underlying ulcerative component but this is grossly unchanged. Electronically Signed   By: Telford Nab M.D.   On: 07/17/2022 05:16   MR THORACIC SPINE WO CONTRAST  Result Date: 06/27/2022 CLINICAL DATA:  Low back and bilateral leg pain. History of multiple myeloma. EXAM: MRI THORACIC SPINE WITHOUT CONTRAST TECHNIQUE: Multiplanar, multisequence MR imaging of the thoracic spine was performed. No intravenous contrast was administered. COMPARISON:  Lumbar MRI 05/30/2022.  Thoracic MRI 03/07/2022. FINDINGS: Despite efforts by the technologist and patient, mild motion artifact is present on today's exam and could not be eliminated. This reduces exam sensitivity and specificity. Alignment:  Physiologic. Vertebrae: Widespread heterogeneity of the marrow is again noted throughout the cervicothoracic spine consistent with multiple  myeloma. No acute fractures are identified. Subacute fractures at T10 and T12 have not significantly progressed from the recent lumbar MRI. There are chronic fractures at T11 and L1 which are grossly stable. Previous spinal augmentation at L1 and L2. Cord:  The thoracic cord appears normal in signal and caliber. Paraspinal and other soft tissues: No significant paraspinal abnormalities. Disc levels: Multilevel  spondylosis with disc bulging and scattered small thoracic disc protrusions, similar to previous study. No cord deformity or high-grade foraminal narrowing identified. IMPRESSION: 1. No acute findings or clear explanation for the patient's symptoms. 2. Subacute fractures at T10 and T12 have not significantly progressed from the recent lumbar MRI. Chronic fractures at T11 and L1 are grossly stable. 3. Widespread marrow heterogeneity consistent with multiple myeloma. 4. Stable spondylosis without cord deformity or high-grade foraminal narrowing. Electronically Signed   By: Richardean Sale M.D.   On: 06/27/2022 10:46     EKG None  Radiology CT ABDOMEN PELVIS W CONTRAST  Result Date: 07/17/2022 CLINICAL DATA:  Generalized abdominal pain. Low back pain. History of multiple myeloma with multilevel thoracic and lumbar kyphoplasty . EXAM: CT ABDOMEN AND PELVIS WITH CONTRAST TECHNIQUE: Multidetector CT imaging of the abdomen and pelvis was performed using the standard protocol following bolus administration of intravenous contrast. RADIATION DOSE REDUCTION: This exam was performed according to the departmental dose-optimization program which includes automated exposure control, adjustment of the mA and/or kV according to patient size and/or use of iterative reconstruction technique. CONTRAST:  161m OMNIPAQUE IOHEXOL 300 MG/ML  SOLN COMPARISON:  Similar study of 07/21/2021. FINDINGS: Lower chest: Minimal posterior atelectasis. Lung bases are otherwise clear. The cardiac size is normal. Hepatobiliary: Stable 1.6 cm cyst in segment 2 of the left lobe. The liver is 19 cm length mildly steatotic without mass enhancement. Gallbladder is contracted but grossly normal. No biliary dilatation. The hepatic portal main vein again measures prominent at 17 mm. Pancreas: No abnormality. Spleen: No abnormality.  No splenomegaly. Adrenals/Urinary Tract: Adrenal glands are unremarkable. Kidneys are normal, without renal calculi,  focal lesion, or hydronephrosis. Bladder is unremarkable. Stomach/Bowel: There is moderate chronic fold thickening in the stomach. Probable chronic gastritis. Endoscopy may be indicated to exclude underlying ulcerative component but this is grossly unchanged. No small bowel dilatation or wall thickening is seen. The appendix is increasingly prominent in caliber measuring 11 mm concerning for appendicitis with portions showing trace adjacent stranding. No appendiceal rupture or abscess is seen. There is moderate stool retention. There are colonic diverticula without evidence for diverticulitis. Vascular/Lymphatic: Anterior to L2 there is a linear radiopacity extending from the anterior vertebral body over to the right and into the lumen of the IVC. This is probably venously intravasated kyphoplasty cement as there appeared to be a cortical vein from L2 in this location emptying into the IVC previously. No other significant vascular findings are seen. No enlarged abdominal, pelvic or inguinal lymph nodes. Reproductive: Prostate is unremarkable. Other: No abdominal wall hernia or abnormality. No abdominopelvic ascites. Musculoskeletal: There is a marked interval change in the bones compared to the prior study. There is new demonstration of diffuse demineralization and heterogeneity in the lower ribs, visualized spine, pelvis and sacrum with innumerable lucent lesions and with vertebral body wedge compression fractures now seen from T10 through L4, moderate at T10, T12, L3 and L4 and relatively milder L1 and L2, with preservation of the normal vertebral height of L5. Kyphoplasty cement has been injected at T10, T 12, L1, L2, L3 and 4. Age of  the compression fractures is not known but none of this was present 1 year ago. IMPRESSION: 1. The appendix is increasingly prominent in caliber measuring up to 11 mm with portions showing trace adjacent stranding. Findings concerning for acute appendicitis. No appendiceal rupture  or abscess is seen. This result was phoned to Dr. Randal Buba at 5:07 a.m., 07/17/2022. 2. Constipation and diverticulosis. 3. Interval demonstration of diffuse demineralization and heterogeneity in the lower ribs, visualized spine, pelvis and sacrum with innumerable lucent lesions and multiple vertebral body wedge compression fractures. Age of the fractures is not known but none of this was present 1 year ago. Multilevel kyphoplasty. 4. Linear radiopacity extending from the anterior L2 vertebral body into the lumen of the IVC. This is probably venously intravasated kyphoplasty cement. 5. Mildly prominent liver with mild steatosis and stable 1.6 cm cyst in segment 2 of the left lobe. 6. Chronic gastritis. Endoscopy may be indicated to exclude underlying ulcerative component but this is grossly unchanged. Electronically Signed   By: Telford Nab M.D.   On: 07/17/2022 05:16    Procedures Procedures    Medications Ordered in ED Medications  sodium chloride (PF) 0.9 % injection (has no administration in time range)  piperacillin-tazobactam (ZOSYN) IVPB 3.375 g (3.375 g Intravenous New Bag/Given 07/17/22 0527)  0.9 %  sodium chloride infusion ( Intravenous New Bag/Given 07/17/22 0524)  droperidol (INAPSINE) 2.5 MG/ML injection 1.25 mg (1.25 mg Intravenous Given 07/17/22 0320)  iohexol (OMNIPAQUE) 300 MG/ML solution 100 mL (100 mLs Intravenous Contrast Given 07/17/22 0424)    ED Course/ Medical Decision Making/ A&P                             Medical Decision Making Patient with MM and abdominal pain that is new as well as ongoing back pain and RLS  Amount and/or Complexity of Data Reviewed External Data Reviewed: labs and notes.    Details: Previous notes and  Labs: ordered.    Details: All labs: normal white count 8, low hemoglobin 11.8, normal 211. Low sodium 130, normal potassium 4.6, normal creatinine .96.  Normal LFTs. Lipase slight elevated 52. Normal urine  Radiology: ordered.    Details: No  free air on CT by me  Discussion of management or test interpretation with external provider(s): Case d/w Dr. Brantley Stage admit to medicine please   Risk Prescription drug management. Parenteral controlled substances. Decision regarding hospitalization.    Final Clinical Impression(s) / ED Diagnoses Final diagnoses:  None   The patient appears reasonably stabilized for admission considering the current resources, flow, and capabilities available in the ED at this time, and I doubt any other [facility] requiring further screening and/or treatment in the ED prior to admission.  Rx / DC Orders ED Discharge Orders     None         Shivaan Tierno, MD 07/17/22 [phone]

## 2022-07-18 DIAGNOSIS — R197 Diarrhea, unspecified: Secondary | ICD-10-CM | POA: Diagnosis not present

## 2022-07-18 DIAGNOSIS — E871 Hypo-osmolality and hyponatremia: Secondary | ICD-10-CM

## 2022-07-18 DIAGNOSIS — C9000 Multiple myeloma not having achieved remission: Secondary | ICD-10-CM | POA: Diagnosis not present

## 2022-07-18 DIAGNOSIS — E782 Mixed hyperlipidemia: Secondary | ICD-10-CM

## 2022-07-18 DIAGNOSIS — S32000D Wedge compression fracture of unspecified lumbar vertebra, subsequent encounter for fracture with routine healing: Secondary | ICD-10-CM

## 2022-07-18 DIAGNOSIS — K219 Gastro-esophageal reflux disease without esophagitis: Secondary | ICD-10-CM

## 2022-07-18 DIAGNOSIS — I10 Essential (primary) hypertension: Secondary | ICD-10-CM

## 2022-07-18 DIAGNOSIS — K3580 Unspecified acute appendicitis: Secondary | ICD-10-CM | POA: Diagnosis not present

## 2022-07-18 DIAGNOSIS — R1084 Generalized abdominal pain: Secondary | ICD-10-CM | POA: Diagnosis not present

## 2022-07-18 LAB — COMPREHENSIVE METABOLIC PANEL
ALT: 13 U/L (ref 0–44)
AST: 13 U/L — ABNORMAL LOW (ref 15–41)
Albumin: 3 g/dL — ABNORMAL LOW (ref 3.5–5.0)
Alkaline Phosphatase: 45 U/L (ref 38–126)
Anion gap: 5 (ref 5–15)
BUN: 17 mg/dL (ref 6–20)
CO2: 24 mmol/L (ref 22–32)
Calcium: 8.2 mg/dL — ABNORMAL LOW (ref 8.9–10.3)
Chloride: 106 mmol/L (ref 98–111)
Creatinine, Ser: 1.07 mg/dL (ref 0.61–1.24)
GFR, Estimated: >60 mL/min (ref >60)
Glucose, Bld: 107 mg/dL — ABNORMAL HIGH (ref 70–99)
Potassium: 4.2 mmol/L (ref 3.5–5.1)
Sodium: 135 mmol/L (ref 135–145)
Total Bilirubin: 0.5 mg/dL (ref 0.3–1.2)
Total Protein: 5.8 g/dL — ABNORMAL LOW (ref 6.5–8.1)

## 2022-07-18 LAB — CBC
HCT: 32 % — ABNORMAL LOW (ref 39.0–52.0)
Hemoglobin: 10.2 g/dL — ABNORMAL LOW (ref 13.0–17.0)
MCH: 28.3 pg (ref 26.0–34.0)
MCHC: 31.9 g/dL (ref 30.0–36.0)
MCV: 88.6 fL (ref 80.0–100.0)
Platelets: 153 10*3/uL (ref 150–400)
RBC: 3.61 MIL/uL — ABNORMAL LOW (ref 4.22–5.81)
RDW: 14.4 % (ref 11.5–15.5)
WBC: 6.8 10*3/uL (ref 4.0–10.5)
nRBC: 0 % (ref 0.0–0.2)

## 2022-07-18 MED ORDER — REVLIMID 25 MG PO CAPS: 25.0000 mg | ORAL_CAPSULE | Freq: Every day | ORAL | 0 refills | Status: DC

## 2022-07-18 MED ORDER — AMOXICILLIN-POT CLAVULANATE 875-125 MG PO TABS: 1.0000 | ORAL_TABLET | Freq: Two times a day (BID) | ORAL | 0 refills | Status: DC

## 2022-07-18 NOTE — Progress Notes (Signed)
  Transition of Care [facility]) Screening Note   Patient Details  Name: [name] Date of Birth: 02-21-1971   Transition of Care [facility]) CM/SW Contact:    Vassie Moselle, LCSW Phone Number: 07/18/2022, 9:51 AM    Transition of Care Department [facility]) has reviewed patient and no TOC needs have been identified at this time. We will continue to monitor patient advancement through interdisciplinary progression rounds. If new patient transition needs arise, please place a TOC consult.

## 2022-07-18 NOTE — Plan of Care (Signed)
  Problem: Education: Goal: Knowledge of General Education information will improve Description: Including pain rating scale, medication(s)/side effects and non-pharmacologic comfort measures 07/18/2022 1009 by Linus Salmons, RN Outcome: Adequate for Discharge 07/18/2022 0907 by Linus Salmons, RN Outcome: Progressing   Problem: Health Behavior/Discharge Planning: Goal: Ability to manage health-related needs will improve 07/18/2022 1009 by Linus Salmons, RN Outcome: Adequate for Discharge 07/18/2022 0907 by Linus Salmons, RN Outcome: Progressing   Problem: Clinical Measurements: Goal: Ability to maintain clinical measurements within normal limits will improve 07/18/2022 1009 by Linus Salmons, RN Outcome: Adequate for Discharge 07/18/2022 0907 by Linus Salmons, RN Outcome: Progressing Goal: Will remain free from infection 07/18/2022 1009 by Linus Salmons, RN Outcome: Adequate for Discharge 07/18/2022 0907 by Linus Salmons, RN Outcome: Progressing Goal: Diagnostic test results will improve 07/18/2022 1009 by Linus Salmons, RN Outcome: Adequate for Discharge 07/18/2022 0907 by Linus Salmons, RN Outcome: Progressing Goal: Respiratory complications will improve 07/18/2022 1009 by Linus Salmons, RN Outcome: Adequate for Discharge 07/18/2022 0907 by Linus Salmons, RN Outcome: Progressing Goal: Cardiovascular complication will be avoided 07/18/2022 1009 by Linus Salmons, RN Outcome: Adequate for Discharge 07/18/2022 0907 by Linus Salmons, RN Outcome: Progressing   Problem: Activity: Goal: Risk for activity intolerance will decrease 07/18/2022 1009 by Linus Salmons, RN Outcome: Adequate for Discharge 07/18/2022 0907 by Linus Salmons, RN Outcome: Progressing   Problem: Nutrition: Goal: Adequate nutrition will be maintained 07/18/2022 1009 by Linus Salmons, RN Outcome: Adequate for Discharge 07/18/2022 0907 by Linus Salmons, RN Outcome: Progressing   Problem:  Coping: Goal: Level of anxiety will decrease 07/18/2022 1009 by Linus Salmons, RN Outcome: Adequate for Discharge 07/18/2022 0907 by Linus Salmons, RN Outcome: Progressing   Problem: Elimination: Goal: Will not experience complications related to bowel motility 07/18/2022 1009 by Linus Salmons, RN Outcome: Adequate for Discharge 07/18/2022 0907 by Linus Salmons, RN Outcome: Progressing Goal: Will not experience complications related to urinary retention 07/18/2022 1009 by Linus Salmons, RN Outcome: Adequate for Discharge 07/18/2022 0907 by Linus Salmons, RN Outcome: Progressing   Problem: Pain Managment: Goal: General experience of comfort will improve 07/18/2022 1009 by Linus Salmons, RN Outcome: Adequate for Discharge 07/18/2022 0907 by Linus Salmons, RN Outcome: Progressing   Problem: Safety: Goal: Ability to remain free from injury will improve 07/18/2022 1009 by Linus Salmons, RN Outcome: Adequate for Discharge 07/18/2022 0907 by Linus Salmons, RN Outcome: Progressing   Problem: Skin Integrity: Goal: Risk for impaired skin integrity will decrease 07/18/2022 1009 by Linus Salmons, RN Outcome: Adequate for Discharge 07/18/2022 0907 by Linus Salmons, RN Outcome: Progressing

## 2022-07-18 NOTE — Progress Notes (Signed)
Subjective: CC: Reports no abdominal pain, n/v since I saw him yesterday. Tolerating soft diet. Passing flatus. No bm yesterday.   Objective: Vital signs in last 24 hours: Temp:  [98.1 F (36.7 C)-98.8 F (37.1 C)] 98.1 F (36.7 C) (02/23 0436) Pulse Rate:  [72-94] 72 (02/23 0436) Resp:  [16-18] 18 (02/23 0436) BP: (98-123)/(48-80) 114/76 (02/23 0436) SpO2:  [99 %-100 %] 100 % (02/23 0436) Last BM Date : 07/16/22  Intake/Output from previous day: 02/22 0701 - 02/23 0700 In: 2685 [I.V.:2685] Out: 0  Intake/Output this shift: No intake/output data recorded.  PE: Gen:  Alert, NAD, pleasant Abd: Soft, ND, NT, +BS  Lab Results:  Recent Labs    07/17/22 0313 07/18/22 0333  WBC 8.0 6.8  HGB 11.8* 10.2*  HCT 36.4* 32.0*  PLT 211 153   BMET Recent Labs    07/17/22 0313 07/18/22 0333  NA 130* 135  K 4.6 4.2  CL 99 106  CO2 18* 24  GLUCOSE 113* 107*  BUN 19 17  CREATININE 0.96 1.07  CALCIUM 9.6 8.2*   PT/INR No results for input(s): "LABPROT", "INR" in the last 72 hours. CMP     Component Value Date/Time   NA 135 07/18/2022 0333   NA 135 12/05/2021 1556   K 4.2 07/18/2022 0333   CL 106 07/18/2022 0333   CO2 24 07/18/2022 0333   GLUCOSE 107 (H) 07/18/2022 0333   BUN 17 07/18/2022 0333   BUN 13 12/05/2021 1556   CREATININE 1.07 07/18/2022 0333   CREATININE 0.90 07/16/2022 1023   CREATININE 1.23 06/01/2012 1150   CALCIUM 8.2 (L) 07/18/2022 0333   PROT 5.8 (L) 07/18/2022 0333   PROT 9.0 (H) 12/05/2021 1556   ALBUMIN 3.0 (L) 07/18/2022 0333   ALBUMIN 3.7 (L) 12/05/2021 1556   AST 13 (L) 07/18/2022 0333   AST 12 (L) 07/16/2022 1023   ALT 13 07/18/2022 0333   ALT 14 07/16/2022 1023   ALKPHOS 45 07/18/2022 0333   BILITOT 0.5 07/18/2022 0333   BILITOT 0.6 07/16/2022 1023   GFRNONAA >60 07/18/2022 0333   GFRNONAA >60 07/16/2022 1023   GFRAA 80 06/10/2019 0849   Lipase     Component Value Date/Time   LIPASE 52 (H) 07/17/2022 0313     Studies/Results: CT ABDOMEN PELVIS W CONTRAST  Result Date: 07/17/2022 CLINICAL DATA:  Generalized abdominal pain. Low back pain. History of multiple myeloma with multilevel thoracic and lumbar kyphoplasty . EXAM: CT ABDOMEN AND PELVIS WITH CONTRAST TECHNIQUE: Multidetector CT imaging of the abdomen and pelvis was performed using the standard protocol following bolus administration of intravenous contrast. RADIATION DOSE REDUCTION: This exam was performed according to the departmental dose-optimization program which includes automated exposure control, adjustment of the mA and/or kV according to patient size and/or use of iterative reconstruction technique. CONTRAST:  163m OMNIPAQUE IOHEXOL 300 MG/ML  SOLN COMPARISON:  Similar study of 07/21/2021. FINDINGS: Lower chest: Minimal posterior atelectasis. Lung bases are otherwise clear. The cardiac size is normal. Hepatobiliary: Stable 1.6 cm cyst in segment 2 of the left lobe. The liver is 19 cm length mildly steatotic without mass enhancement. Gallbladder is contracted but grossly normal. No biliary dilatation. The hepatic portal main vein again measures prominent at 17 mm. Pancreas: No abnormality. Spleen: No abnormality.  No splenomegaly. Adrenals/Urinary Tract: Adrenal glands are unremarkable. Kidneys are normal, without renal calculi, focal lesion, or hydronephrosis. Bladder is unremarkable. Stomach/Bowel: There is moderate chronic fold thickening in the stomach.  Probable chronic gastritis. Endoscopy may be indicated to exclude underlying ulcerative component but this is grossly unchanged. No small bowel dilatation or wall thickening is seen. The appendix is increasingly prominent in caliber measuring 11 mm concerning for appendicitis with portions showing trace adjacent stranding. No appendiceal rupture or abscess is seen. There is moderate stool retention. There are colonic diverticula without evidence for diverticulitis. Vascular/Lymphatic: Anterior to  L2 there is a linear radiopacity extending from the anterior vertebral body over to the right and into the lumen of the IVC. This is probably venously intravasated kyphoplasty cement as there appeared to be a cortical vein from L2 in this location emptying into the IVC previously. No other significant vascular findings are seen. No enlarged abdominal, pelvic or inguinal lymph nodes. Reproductive: Prostate is unremarkable. Other: No abdominal wall hernia or abnormality. No abdominopelvic ascites. Musculoskeletal: There is a marked interval change in the bones compared to the prior study. There is new demonstration of diffuse demineralization and heterogeneity in the lower ribs, visualized spine, pelvis and sacrum with innumerable lucent lesions and with vertebral body wedge compression fractures now seen from T10 through L4, moderate at T10, T12, L3 and L4 and relatively milder L1 and L2, with preservation of the normal vertebral height of L5. Kyphoplasty cement has been injected at T10, T 12, L1, L2, L3 and 4. Age of the compression fractures is not known but none of this was present 1 year ago. IMPRESSION: 1. The appendix is increasingly prominent in caliber measuring up to 11 mm with portions showing trace adjacent stranding. Findings concerning for acute appendicitis. No appendiceal rupture or abscess is seen. This result was phoned to Dr. Randal Buba at 5:07 a.m., 07/17/2022. 2. Constipation and diverticulosis. 3. Interval demonstration of diffuse demineralization and heterogeneity in the lower ribs, visualized spine, pelvis and sacrum with innumerable lucent lesions and multiple vertebral body wedge compression fractures. Age of the fractures is not known but none of this was present 1 year ago. Multilevel kyphoplasty. 4. Linear radiopacity extending from the anterior L2 vertebral body into the lumen of the IVC. This is probably venously intravasated kyphoplasty cement. 5. Mildly prominent liver with mild steatosis  and stable 1.6 cm cyst in segment 2 of the left lobe. 6. Chronic gastritis. Endoscopy may be indicated to exclude underlying ulcerative component but this is grossly unchanged. Electronically Signed   By: Telford Nab M.D.   On: 07/17/2022 05:16    Anti-infectives: Anti-infectives (From admission, onward)    Start     Dose/Rate Route Frequency Ordered Stop   07/17/22 1200  piperacillin-tazobactam (ZOSYN) IVPB 3.375 g        3.375 g 12.5 mL/hr over 240 Minutes Intravenous Every 8 hours 07/17/22 0636     07/17/22 1115  acyclovir (ZOVIRAX) tablet 400 mg        400 mg Oral 2 times daily 07/17/22 1029     07/17/22 0515  piperacillin-tazobactam (ZOSYN) IVPB 3.375 g        3.375 g 100 mL/hr over 30 Minutes Intravenous  Once 07/17/22 0515 07/17/22 [id number]        Assessment/Plan Possible Acute Appendicitis - This is a 52 year old male with hx of MM currently on VRD therapy who's CT showed appendix measuring up to 11 mm with portions showing trace adjacent stranding. No perforation or abscess.  - Given atypical presentation, resolution of symptoms, reassuring vitals/labs, tolerating diet, NT on exam would recommend conservative management with 5d oral abx. He can be d/c'd  from our standpoint.  - He understands if he develops any concerning symptoms (fever, n/v, new/worsening abdominal pain) he is to present back to the ED for re-evaluation. - Will reach out to [facility] and let them know he is okay for d/c    FEN - Soft diet, IVF per [facility] VTE - SCDs, okay for chemical ppx from a general surgery standpoint ID - On Zosyn. Can transition to Augmentin for 5d  I reviewed nursing notes, hospitalist notes, last 24 h vitals and pain scores, last 48 h intake and output, last 24 h labs and trends, and last 24 h imaging results   LOS: 1 day    Jillyn Ledger , [facility] 07/18/2022, 8:49 AM Please see Amion for pager number during day hours 7:00am-4:30pm

## 2022-07-18 NOTE — Plan of Care (Signed)

## 2022-07-18 NOTE — Progress Notes (Signed)
Pt is discharging home with no needs. Pt is alert and oriented. AVS was given and explained. Pt was given time to ask questions. IV was discontinued. Pt belongings were packed and sent home with the pt. Pt is being escorted to ER entrance via wheelchair by CNA. Pt is driving himself home.

## 2022-07-18 NOTE — Discharge Summary (Signed)
Physician Discharge Summary  [name] [id number] DOB: May 10, 1971 DOA: 07/17/2022  PCP: [name], MD  Admit date: 07/17/2022 Discharge date: 07/18/2022  Admitted From: Home  Discharge disposition: Home   Recommendations for Outpatient Follow-Up:   Follow up with your primary care provider in one week.  Check CBC, BMP, magnesium in the next visit Follow-up with oncology as scheduled by the clinic.  Discharge Diagnosis:   Principal Problem:   Acute appendicitis Active Problems:   Hyponatremia   Traumatic compression fracture of lumbar vertebra (HCC)   Multiple myeloma not having achieved remission (HCC)   Essential hypertension   GERD without esophagitis   Mixed hyperlipidemia  Discharge Condition: Improved.  Diet recommendation: Low sodium, heart healthy.    Wound care: None.  Code status: Full.   History of Present Illness:   [name] is a 52 y.o. male with medical history of multiple myeloma, hyperlipidemia, hypertension, history of tobacco abuse presented to hospital with generalized abdominal pain for 3 days associated with nausea and dry heaving but diarrhea.  In the ED, vitals with stable for labs showed no leukocytosis mild hyponatremia at 130.  CT scan of the abdomen and pelvis showed  prominent in caliber appendix showing trace adjacent stranding concerning for appendicitis, multilevel kyphoplasty.  Patient was then admitted to the hospital and general surgery was consulted.  Hospital Course:   Following conditions were addressed during hospitalization as listed below,  Possible acute appendicitis General surgery was consulted and patient was treated conservatively with antibiotics.  Patient had improved symptoms and had atypical presentation so plan was to proceed with conservative treatment.  Patient is afebrile.  At this time, patient is asymptomatic with no pain fever, chills or abdominal tenderness.  General surgery has recommended  5-day course of Augmentin on discharge.  He was advised to return to the hospital for worsening symptoms.     Hyponatremia Secondary to HCTZ use/recent GI losses.  Improved    Traumatic compression fracture of lumbar vertebra  On hydromorphone and MS Contin. Discharge.     Multiple myeloma not having achieved remission  Follows up with Dr. Lorenso Courier as outpatient.  Patient is on Revlimid as outpatient, will hold until completion of Augmentin     Essential hypertension Continue lisinopril, diuretics on discharge     GERD without esophagitis Continue PPI.     Mixed hyperlipidemia Continue Crestor.  Disposition.  At this time, patient is stable for disposition home with outpatient PCP follow-up.  Medical Consultants:   General surgery.  Procedures:    None Subjective:   Today, patient was seen and examined at bedside.  Denies any nausea, vomiting, fever, chills or rigor.  Denies abdominal pain.  Discharge Exam:   Vitals:   07/17/22 2349 07/18/22 0436  BP: 107/62 114/76  Pulse: 77 72  Resp: 16 18  Temp: 98.2 F (36.8 C) 98.1 F (36.7 C)  SpO2: 100% 100%   Vitals:   07/17/22 1553 07/17/22 1948 07/17/22 2349 07/18/22 0436  BP: 116/73 (!) 98/48 107/62 114/76  Pulse: 87 90 77 72  Resp: '18 18 16 18  '$ Temp: 98.2 F (36.8 C) 98.2 F (36.8 C) 98.2 F (36.8 C) 98.1 F (36.7 C)  TempSrc: Oral   Oral  SpO2: 100% 99% 100% 100%  Weight:      Height:       General: Alert awake, not in obvious distress HENT: pupils equally reacting to light,  No scleral pallor or icterus noted. Oral mucosa  is moist.  Chest:  Clear breath sounds.  Diminished breath sounds bilaterally. No crackles or wheezes.  CVS: S1 &S2 heard. No murmur.  Regular rate and rhythm. Abdomen: Soft, nontender, nondistended.  Bowel sounds are heard.   Extremities: No cyanosis, clubbing or edema.  Peripheral pulses are palpable. Psych: Alert, awake and oriented, normal mood CNS:  No cranial nerve deficits.   Power equal in all extremities.   Skin: Warm and dry.  No rashes noted.  The results of significant diagnostics from this hospitalization (including imaging, microbiology, ancillary and laboratory) are listed below for reference.     Diagnostic Studies:   CT ABDOMEN PELVIS W CONTRAST  Result Date: 07/17/2022 CLINICAL DATA:  Generalized abdominal pain. Low back pain. History of multiple myeloma with multilevel thoracic and lumbar kyphoplasty . EXAM: CT ABDOMEN AND PELVIS WITH CONTRAST TECHNIQUE: Multidetector CT imaging of the abdomen and pelvis was performed using the standard protocol following bolus administration of intravenous contrast. RADIATION DOSE REDUCTION: This exam was performed according to the departmental dose-optimization program which includes automated exposure control, adjustment of the mA and/or kV according to patient size and/or use of iterative reconstruction technique. CONTRAST:  127m OMNIPAQUE IOHEXOL 300 MG/ML  SOLN COMPARISON:  Similar study of 07/21/2021. FINDINGS: Lower chest: Minimal posterior atelectasis. Lung bases are otherwise clear. The cardiac size is normal. Hepatobiliary: Stable 1.6 cm cyst in segment 2 of the left lobe. The liver is 19 cm length mildly steatotic without mass enhancement. Gallbladder is contracted but grossly normal. No biliary dilatation. The hepatic portal main vein again measures prominent at 17 mm. Pancreas: No abnormality. Spleen: No abnormality.  No splenomegaly. Adrenals/Urinary Tract: Adrenal glands are unremarkable. Kidneys are normal, without renal calculi, focal lesion, or hydronephrosis. Bladder is unremarkable. Stomach/Bowel: There is moderate chronic fold thickening in the stomach. Probable chronic gastritis. Endoscopy may be indicated to exclude underlying ulcerative component but this is grossly unchanged. No small bowel dilatation or wall thickening is seen. The appendix is increasingly prominent in caliber measuring 11 mm concerning  for appendicitis with portions showing trace adjacent stranding. No appendiceal rupture or abscess is seen. There is moderate stool retention. There are colonic diverticula without evidence for diverticulitis. Vascular/Lymphatic: Anterior to L2 there is a linear radiopacity extending from the anterior vertebral body over to the right and into the lumen of the IVC. This is probably venously intravasated kyphoplasty cement as there appeared to be a cortical vein from L2 in this location emptying into the IVC previously. No other significant vascular findings are seen. No enlarged abdominal, pelvic or inguinal lymph nodes. Reproductive: Prostate is unremarkable. Other: No abdominal wall hernia or abnormality. No abdominopelvic ascites. Musculoskeletal: There is a marked interval change in the bones compared to the prior study. There is new demonstration of diffuse demineralization and heterogeneity in the lower ribs, visualized spine, pelvis and sacrum with innumerable lucent lesions and with vertebral body wedge compression fractures now seen from T10 through L4, moderate at T10, T12, L3 and L4 and relatively milder L1 and L2, with preservation of the normal vertebral height of L5. Kyphoplasty cement has been injected at T10, T 12, L1, L2, L3 and 4. Age of the compression fractures is not known but none of this was present 1 year ago. IMPRESSION: 1. The appendix is increasingly prominent in caliber measuring up to 11 mm with portions showing trace adjacent stranding. Findings concerning for acute appendicitis. No appendiceal rupture or abscess is seen. This result was phoned to Dr. PRandal Buba  at 5:07 a.m., 07/17/2022. 2. Constipation and diverticulosis. 3. Interval demonstration of diffuse demineralization and heterogeneity in the lower ribs, visualized spine, pelvis and sacrum with innumerable lucent lesions and multiple vertebral body wedge compression fractures. Age of the fractures is not known but none of this was  present 1 year ago. Multilevel kyphoplasty. 4. Linear radiopacity extending from the anterior L2 vertebral body into the lumen of the IVC. This is probably venously intravasated kyphoplasty cement. 5. Mildly prominent liver with mild steatosis and stable 1.6 cm cyst in segment 2 of the left lobe. 6. Chronic gastritis. Endoscopy may be indicated to exclude underlying ulcerative component but this is grossly unchanged. Electronically Signed   By: Telford Nab M.D.   On: 07/17/2022 05:16     Labs:   Basic Metabolic Panel: Recent Labs  Lab 07/15/22 0309 07/16/22 1023 07/17/22 0313 07/18/22 0333  NA 131* 132* 130* 135  K 4.1 3.6 4.6 4.2  CL 100 101 99 106  CO2 22 23 18* 24  GLUCOSE 111* 122* 113* 107*  BUN '16 14 19 17  '$ CREATININE 1.01 0.90 0.96 1.07  CALCIUM 9.3 9.2 9.6 8.2*  MG 1.7  --   --   --    GFR Estimated Creatinine Clearance: 92.5 mL/min (by C-G formula based on SCr of 1.07 mg/dL). Liver Function Tests: Recent Labs  Lab 07/16/22 1023 07/17/22 0313 07/18/22 0333  AST 12* 21 13*  ALT '14 15 13  '$ ALKPHOS 77 60 45  BILITOT 0.6 0.5 0.5  PROT 7.8 7.3 5.8*  ALBUMIN 4.3 4.0 3.0*   Recent Labs  Lab 07/17/22 0313  LIPASE 52*   No results for input(s): "AMMONIA" in the last 168 hours. Coagulation profile No results for input(s): "INR", "PROTIME" in the last 168 hours.  CBC: Recent Labs  Lab 07/15/22 0309 07/16/22 1023 07/17/22 0313 07/18/22 0333  WBC 7.0 8.1 8.0 6.8  NEUTROABS  --  4.7 5.9  --   HGB 11.5* 13.2 11.8* 10.2*  HCT 36.4* 39.0 36.4* 32.0*  MCV 86.3 83.3 85.0 88.6  PLT 166 184 211 153   Cardiac Enzymes: No results for input(s): "CKTOTAL", "CKMB", "CKMBINDEX", "TROPONINI" in the last 168 hours. BNP: Invalid input(s): "POCBNP" CBG: No results for input(s): "GLUCAP" in the last 168 hours. D-Dimer No results for input(s): "DDIMER" in the last 72 hours. Hgb A1c No results for input(s): "HGBA1C" in the last 72 hours. Lipid Profile No results for  input(s): "CHOL", "HDL", "LDLCALC", "TRIG", "CHOLHDL", "LDLDIRECT" in the last 72 hours. Thyroid function studies No results for input(s): "TSH", "T4TOTAL", "T3FREE", "THYROIDAB" in the last 72 hours.  Invalid input(s): "FREET3" Anemia work up No results for input(s): "VITAMINB12", "FOLATE", "FERRITIN", "TIBC", "IRON", "RETICCTPCT" in the last 72 hours. Microbiology No results found for this or any previous visit (from the past 240 hour(s)).   Discharge Instructions:   Discharge Instructions     Call MD for:  persistant nausea and vomiting   Complete by: As directed    Call MD for:  severe uncontrolled pain   Complete by: As directed    Diet - low sodium heart healthy   Complete by: As directed    Discharge instructions   Complete by: As directed    Follow up with your primary care provider in one week. Complete course of antibiotics. Seek medical attention for worsening symptoms.   Discharge instructions   Complete by: As directed    Do not take Revlimid until done with antibiotics  Increase activity slowly   Complete by: As directed       Allergies as of 07/18/2022   No Known Allergies      Medication List     TAKE these medications    acyclovir 400 MG tablet Commonly known as: ZOVIRAX Take 1 tablet (400 mg total) by mouth 2 (two) times daily.   allopurinol 300 MG tablet Commonly known as: ZYLOPRIM Take 1 tablet (300 mg total) by mouth daily.   amoxicillin-clavulanate 875-125 MG tablet Commonly known as: AUGMENTIN Take 1 tablet by mouth 2 (two) times daily.   aspirin EC 81 MG tablet Take 81 mg by mouth daily. Swallow whole.   cetirizine 10 MG tablet Commonly known as: ZYRTEC Take 10 mg by mouth daily as needed for allergies.   clonazePAM 0.5 MG tablet Commonly known as: KLONOPIN Take 1 tablet (0.5 mg total) by mouth 2 (two) times daily as needed for anxiety.   diazepam 5 MG tablet Commonly known as: VALIUM SMARTSIG:1-2 Tablet(s) By Mouth    docusate sodium 100 MG capsule Commonly known as: COLACE Take 1 capsule (100 mg total) by mouth 2 (two) times daily as needed for mild constipation.   DULoxetine 20 MG capsule Commonly known as: Cymbalta Take 1 capsule (20 mg total) by mouth daily.   gabapentin 100 MG capsule Commonly known as: NEURONTIN Take 1 capsule (100 mg total) by mouth 2 (two) times daily.   hydrochlorothiazide 25 MG tablet Commonly known as: HYDRODIURIL Take 25 mg by mouth daily.   lisinopril 40 MG tablet Commonly known as: ZESTRIL Take 40 mg by mouth daily.   Methocarbamol 1000 MG Tabs Take 1,000 mg by mouth every 6 (six) hours as needed. What changed: reasons to take this   morphine 30 MG 12 hr tablet Commonly known as: MS CONTIN Take 1 tablet by mouth every 8 hours. What changed:  when to take this reasons to take this   ondansetron 8 MG tablet Commonly known as: ZOFRAN Take 1 tablet (8 mg total) by mouth every 8 (eight) hours as needed for nausea or vomiting.   pantoprazole 40 MG tablet Commonly known as: PROTONIX Take 1 tablet (40 mg total) by mouth 2 (two) times daily.   polyethylene glycol powder 17 GM/SCOOP powder Commonly known as: GLYCOLAX/MIRALAX Take 1 capful (17 g) with water by mouth daily. What changed:  when to take this reasons to take this   prochlorperazine 10 MG tablet Commonly known as: COMPAZINE Take 1 tablet (10 mg total) by mouth every 6 (six) hours as needed for nausea or vomiting.   Revlimid 25 MG capsule Generic drug: lenalidomide Take 1 capsule (25 mg total) by mouth daily. Take one capsule daily x 14 days. None for following 7 days Start taking on: July 24, 2022 What changed: These instructions start on July 24, 2022. If you are unsure what to do until then, ask your doctor or other care provider.   rOPINIRole 0.25 MG tablet Commonly known as: REQUIP Take 1 tablet (0.25 mg total) by mouth at bedtime as needed for up to 10 days. What changed:  reasons to take this   rosuvastatin 10 MG tablet Commonly known as: CRESTOR Take 10 mg by mouth daily.          Time coordinating discharge: 39 minutes  Signed:  Trenton Verne  [facility] 07/18/2022, 10:31 AM

## 2022-07-21 LAB — MULTIPLE MYELOMA PANEL, SERUM
Albumin SerPl Elph-Mcnc: 3.8 g/dL (ref 2.9–4.4)
Albumin/Glob SerPl: 1.1 (ref 0.7–1.7)
Alpha 1: 0.3 g/dL (ref 0.0–0.4)
Alpha2 Glob SerPl Elph-Mcnc: 1 g/dL (ref 0.4–1.0)
B-Globulin SerPl Elph-Mcnc: 1.1 g/dL (ref 0.7–1.3)
Gamma Glob SerPl Elph-Mcnc: 1.1 g/dL (ref 0.4–1.8)
Globulin, Total: 3.5 g/dL (ref 2.2–3.9)
IgA: 53 mg/dL — ABNORMAL LOW (ref 90–386)
IgG (Immunoglobin G), Serum: 1195 mg/dL (ref 603–1613)
IgM (Immunoglobulin M), Srm: 43 mg/dL (ref 20–172)
M Protein SerPl Elph-Mcnc: 0.5 g/dL — ABNORMAL HIGH
Total Protein ELP: 7.3 g/dL (ref 6.0–8.5)

## 2022-07-22 ENCOUNTER — Other Ambulatory Visit: Payer: Self-pay | Admitting: Family Medicine

## 2022-07-22 NOTE — Telephone Encounter (Signed)
Copied from [place] [id number]. Topic: General - Other >> Jul 22, 2022  9:07 AM Everette C wrote: Reason for CRM: Medication Refill - Medication: rosuvastatin (CRESTOR) 10 MG tablet [id number]  clonazePAM (KLONOPIN) 0.5 MG tablet [id number]  Has the patient contacted their pharmacy? Yes.   (Agent: If no, request that the patient contact the pharmacy for the refill. If patient does not wish to contact the pharmacy document the reason why and proceed with request.) (Agent: If yes, when and what did the pharmacy advise?)  Preferred Pharmacy (with phone number or street name): [place] ([place]), [place] - [address] [place] ([place]) [place] [address] Phone: [phone] Fax: [phone] Hours: Not open 24 hours   Has the patient been seen for an appointment in the last year OR does the patient have an upcoming appointment? Yes.    Agent: Please be advised that RX refills may take up to 3 business days. We ask that you follow-up with your pharmacy.

## 2022-07-22 NOTE — Telephone Encounter (Signed)
Requested medication (s) are due for refill today: historical medication  Requested medication (s) are on the active medication list: yes   Last refill:  klonopin 04/22/22 #15 0 refills, crestor historical med  Future visit scheduled: yes in 2 months   Notes to clinic:  not delegated per protocol - klonopin, historical medication - crestor. Do you want to refill klonopin and order crestor?     Requested Prescriptions  Pending Prescriptions Disp Refills   clonazePAM (KLONOPIN) 0.5 MG tablet 15 tablet 0    Sig: Take 1 tablet (0.5 mg total) by mouth 2 (two) times daily as needed for anxiety.     Not Delegated - Psychiatry: Anxiolytics/Hypnotics 2 Failed - 07/22/2022  9:18 AM      Failed - This refill cannot be delegated      Failed - Urine Drug Screen completed in last 360 days      Passed - Patient is not pregnant      Passed - Valid encounter within last 6 months    Recent Outpatient Visits           3 weeks ago Essential hypertension   [place] [facility], MD   3 months ago Hospital discharge follow-up   [place] [facility], MD   6 months ago Abnormal MRI   [facility] MD   7 months ago Acute pain of left shoulder   [place] [facility], MD   7 months ago annual physical   [place] [facility], MD       Future Appointments             In 2 months [name], MD [facility]             rosuvastatin (CRESTOR) 10 MG tablet      Sig: Take 1 tablet (10 mg total) by mouth daily.     Cardiovascular:  Antilipid - Statins 2 Failed - 07/22/2022  9:18 AM      Failed - Lipid Panel in normal range within the last 12 months    Cholesterol, Total  Date Value Ref Range Status  12/05/2021 183 100 - 199 mg/dL Final   LDL Chol Calc (NIH)  Date  Value Ref Range Status  12/05/2021 68 0 - 99 mg/dL Final   HDL  Date Value Ref Range Status  12/05/2021 31 (L) >39 mg/dL Final   Triglycerides  Date Value Ref Range Status  12/05/2021 540 (H) 0 - 149 mg/dL Final         Passed - Cr in normal range and within 360 days    Creatinine  Date Value Ref Range Status  07/16/2022 0.90 0.61 - 1.24 mg/dL Final   Creat  Date Value Ref Range Status  06/01/2012 1.23 0.50 - 1.35 mg/dL Final   Creatinine, Ser  Date Value Ref Range Status  07/18/2022 1.07 0.61 - 1.24 mg/dL Final   Creatinine, Urine  Date Value Ref Range Status  02/20/2022 117 mg/dL Final    Comment:    Performed at [facility], [place] [address], [place], [place] [address]         Passed - Patient is not pregnant      Passed - Valid encounter within last 12 months    Recent Outpatient Visits  3 weeks ago Essential hypertension   [place] [facility], MD   3 months ago Hospital discharge follow-up   [facility], MD   6 months ago Abnormal MRI   [facility], MD   7 months ago Acute pain of left shoulder   [place] [facility], MD   7 months ago annual physical   [place] [facility], MD       Future Appointments             In 2 months [name], MD [facility] at [facility]

## 2022-07-23 ENCOUNTER — Inpatient Hospital Stay: Payer: BC Managed Care – PPO

## 2022-07-23 ENCOUNTER — Other Ambulatory Visit: Payer: Self-pay

## 2022-07-23 ENCOUNTER — Inpatient Hospital Stay: Payer: BC Managed Care – PPO | Admitting: Hematology and Oncology

## 2022-07-23 VITALS — BP 127/85 | HR 83 | Temp 98.6°F | Resp 18 | Wt 224.8 lb

## 2022-07-23 DIAGNOSIS — C9000 Multiple myeloma not having achieved remission: Secondary | ICD-10-CM | POA: Diagnosis not present

## 2022-07-23 DIAGNOSIS — K449 Diaphragmatic hernia without obstruction or gangrene: Secondary | ICD-10-CM | POA: Diagnosis not present

## 2022-07-23 DIAGNOSIS — Z7982 Long term (current) use of aspirin: Secondary | ICD-10-CM | POA: Diagnosis not present

## 2022-07-23 DIAGNOSIS — E785 Hyperlipidemia, unspecified: Secondary | ICD-10-CM | POA: Diagnosis not present

## 2022-07-23 DIAGNOSIS — I10 Essential (primary) hypertension: Secondary | ICD-10-CM | POA: Diagnosis not present

## 2022-07-23 DIAGNOSIS — K5900 Constipation, unspecified: Secondary | ICD-10-CM | POA: Diagnosis not present

## 2022-07-23 DIAGNOSIS — Z79899 Other long term (current) drug therapy: Secondary | ICD-10-CM | POA: Diagnosis not present

## 2022-07-23 DIAGNOSIS — F17210 Nicotine dependence, cigarettes, uncomplicated: Secondary | ICD-10-CM | POA: Diagnosis not present

## 2022-07-23 DIAGNOSIS — Z7961 Long term (current) use of immunomodulator: Secondary | ICD-10-CM | POA: Diagnosis not present

## 2022-07-23 DIAGNOSIS — R110 Nausea: Secondary | ICD-10-CM | POA: Diagnosis not present

## 2022-07-23 DIAGNOSIS — Z86010 Personal history of colonic polyps: Secondary | ICD-10-CM | POA: Diagnosis not present

## 2022-07-23 DIAGNOSIS — Z79624 Long term (current) use of inhibitors of nucleotide synthesis: Secondary | ICD-10-CM | POA: Diagnosis not present

## 2022-07-23 DIAGNOSIS — K219 Gastro-esophageal reflux disease without esophagitis: Secondary | ICD-10-CM | POA: Diagnosis not present

## 2022-07-23 DIAGNOSIS — Z5112 Encounter for antineoplastic immunotherapy: Secondary | ICD-10-CM | POA: Diagnosis not present

## 2022-07-23 LAB — CBC WITH DIFFERENTIAL (CANCER CENTER ONLY)
Abs Immature Granulocytes: 0.05 10*3/uL (ref 0.00–0.07)
Basophils Absolute: 0 10*3/uL (ref 0.0–0.1)
Basophils Relative: 0 %
Eosinophils Absolute: 0.2 10*3/uL (ref 0.0–0.5)
Eosinophils Relative: 2 %
HCT: 36.7 % — ABNORMAL LOW (ref 39.0–52.0)
Hemoglobin: 12.4 g/dL — ABNORMAL LOW (ref 13.0–17.0)
Immature Granulocytes: 1 %
Lymphocytes Relative: 38 %
Lymphs Abs: 2.6 10*3/uL (ref 0.7–4.0)
MCH: 28.6 pg (ref 26.0–34.0)
MCHC: 33.8 g/dL (ref 30.0–36.0)
MCV: 84.8 fL (ref 80.0–100.0)
Monocytes Absolute: 0.6 10*3/uL (ref 0.1–1.0)
Monocytes Relative: 9 %
Neutro Abs: 3.3 10*3/uL (ref 1.7–7.7)
Neutrophils Relative %: 50 %
Platelet Count: 247 10*3/uL (ref 150–400)
RBC: 4.33 MIL/uL (ref 4.22–5.81)
RDW: 14.6 % (ref 11.5–15.5)
WBC Count: 6.7 10*3/uL (ref 4.0–10.5)
nRBC: 0 % (ref 0.0–0.2)

## 2022-07-23 LAB — CMP (CANCER CENTER ONLY)
ALT: 12 U/L (ref 0–44)
AST: 11 U/L — ABNORMAL LOW (ref 15–41)
Albumin: 4.2 g/dL (ref 3.5–5.0)
Alkaline Phosphatase: 71 U/L (ref 38–126)
Anion gap: 6 (ref 5–15)
BUN: 10 mg/dL (ref 6–20)
CO2: 26 mmol/L (ref 22–32)
Calcium: 9.1 mg/dL (ref 8.9–10.3)
Chloride: 102 mmol/L (ref 98–111)
Creatinine: 0.75 mg/dL (ref 0.61–1.24)
GFR, Estimated: >60 mL/min (ref >60)
Glucose, Bld: 98 mg/dL (ref 70–99)
Potassium: 3.8 mmol/L (ref 3.5–5.1)
Sodium: 134 mmol/L — ABNORMAL LOW (ref 135–145)
Total Bilirubin: 0.4 mg/dL (ref 0.3–1.2)
Total Protein: 6.8 g/dL (ref 6.5–8.1)

## 2022-07-23 MED ORDER — BORTEZOMIB CHEMO SQ INJECTION 3.5 MG (2.5MG/ML)
1.3000 mg/m2 | Freq: Once | INTRAMUSCULAR | Status: AC
  Administered 2022-07-23: 3 mg via SUBCUTANEOUS
  Filled 2022-07-23: qty 1.2

## 2022-07-23 MED ORDER — DEXAMETHASONE 4 MG PO TABS
40.0000 mg | ORAL_TABLET | Freq: Once | ORAL | Status: AC
  Administered 2022-07-23: 40 mg via ORAL
  Filled 2022-07-23: qty 10

## 2022-07-23 NOTE — Patient Instructions (Signed)
[facility]  Discharge Instructions: Thank you for choosing [facility] to provide your oncology and hematology care.   If you have a lab appointment with the [facility], please go directly to the [facility] and check in at the registration area.   Wear comfortable clothing and clothing appropriate for easy access to any Portacath or PICC line.   We strive to give you quality time with your provider. You may need to reschedule your appointment if you arrive late (15 or more minutes).  Arriving late affects you and other patients whose appointments are after yours.  Also, if you miss three or more appointments without notifying the office, you may be dismissed from the clinic at the provider's discretion.      For prescription refill requests, have your pharmacy contact our office and allow 72 hours for refills to be completed.    Today you received the following chemotherapy and/or immunotherapy agents: Velcade.      To help prevent nausea and vomiting after your treatment, we encourage you to take your nausea medication as directed.  BELOW ARE SYMPTOMS THAT SHOULD BE REPORTED IMMEDIATELY: *FEVER GREATER THAN 100.4 F (38 C) OR HIGHER *CHILLS OR SWEATING *NAUSEA AND VOMITING THAT IS NOT CONTROLLED WITH YOUR NAUSEA MEDICATION *UNUSUAL SHORTNESS OF BREATH *UNUSUAL BRUISING OR BLEEDING *URINARY PROBLEMS (pain or burning when urinating, or frequent urination) *BOWEL PROBLEMS (unusual diarrhea, constipation, pain near the anus) TENDERNESS IN MOUTH AND THROAT WITH OR WITHOUT PRESENCE OF ULCERS (sore throat, sores in mouth, or a toothache) UNUSUAL RASH, SWELLING OR PAIN  UNUSUAL VAGINAL DISCHARGE OR ITCHING   Items with * indicate a potential emergency and should be followed up as soon as possible or go to the Emergency Department if any problems should occur.  Please show the CHEMOTHERAPY ALERT CARD or IMMUNOTHERAPY ALERT CARD at  check-in to the Emergency Department and triage nurse.  Should you have questions after your visit or need to cancel or reschedule your appointment, please contact [facility]  Dept: [phone]  and follow the prompts.  Office hours are 8:00 a.m. to 4:30 p.m. Monday - Friday. Please note that voicemails left after 4:00 p.m. may not be returned until the following business day.  We are closed weekends and major holidays. You have access to a nurse at all times for urgent questions. Please call the main number to the clinic Dept: [phone] and follow the prompts.   For any non-urgent questions, you may also contact your provider using MyChart. We now offer e-Visits for anyone 37 and older to request care online for non-urgent symptoms. For details visit [email or web address].   Also download the MyChart app! Go to the app store, search "MyChart", open the app, select [facility], and log in with your MyChart username and password.

## 2022-07-24 ENCOUNTER — Telehealth: Payer: Self-pay

## 2022-07-24 NOTE — Telephone Encounter (Signed)
Pt called requesting letter stating that he will be at [facility] on 07/29/22. This letter is for his attorney to present to the court system. Letter left at front desk.

## 2022-07-28 ENCOUNTER — Other Ambulatory Visit: Payer: Self-pay

## 2022-07-28 ENCOUNTER — Inpatient Hospital Stay: Payer: BC Managed Care – PPO | Attending: Physician Assistant | Admitting: Internal Medicine

## 2022-07-28 ENCOUNTER — Telehealth: Payer: Self-pay | Admitting: Family Medicine

## 2022-07-28 VITALS — BP 124/77 | HR 102 | Temp 97.0°F | Resp 20 | Wt 222.5 lb

## 2022-07-28 DIAGNOSIS — Z800 Family history of malignant neoplasm of digestive organs: Secondary | ICD-10-CM | POA: Insufficient documentation

## 2022-07-28 DIAGNOSIS — C9000 Multiple myeloma not having achieved remission: Secondary | ICD-10-CM | POA: Diagnosis not present

## 2022-07-28 DIAGNOSIS — Z7982 Long term (current) use of aspirin: Secondary | ICD-10-CM | POA: Diagnosis not present

## 2022-07-28 DIAGNOSIS — I10 Essential (primary) hypertension: Secondary | ICD-10-CM | POA: Diagnosis not present

## 2022-07-28 DIAGNOSIS — G2581 Restless legs syndrome: Secondary | ICD-10-CM | POA: Diagnosis not present

## 2022-07-28 DIAGNOSIS — Z5112 Encounter for antineoplastic immunotherapy: Secondary | ICD-10-CM | POA: Diagnosis not present

## 2022-07-28 DIAGNOSIS — K449 Diaphragmatic hernia without obstruction or gangrene: Secondary | ICD-10-CM | POA: Insufficient documentation

## 2022-07-28 DIAGNOSIS — K5900 Constipation, unspecified: Secondary | ICD-10-CM | POA: Diagnosis not present

## 2022-07-28 DIAGNOSIS — Z86010 Personal history of colonic polyps: Secondary | ICD-10-CM | POA: Insufficient documentation

## 2022-07-28 DIAGNOSIS — K219 Gastro-esophageal reflux disease without esophagitis: Secondary | ICD-10-CM | POA: Insufficient documentation

## 2022-07-28 DIAGNOSIS — Z79899 Other long term (current) drug therapy: Secondary | ICD-10-CM | POA: Insufficient documentation

## 2022-07-28 DIAGNOSIS — F17210 Nicotine dependence, cigarettes, uncomplicated: Secondary | ICD-10-CM | POA: Diagnosis not present

## 2022-07-28 DIAGNOSIS — Z79624 Long term (current) use of inhibitors of nucleotide synthesis: Secondary | ICD-10-CM | POA: Diagnosis not present

## 2022-07-28 DIAGNOSIS — Z7961 Long term (current) use of immunomodulator: Secondary | ICD-10-CM | POA: Insufficient documentation

## 2022-07-28 DIAGNOSIS — E785 Hyperlipidemia, unspecified: Secondary | ICD-10-CM | POA: Insufficient documentation

## 2022-07-28 DIAGNOSIS — Z8719 Personal history of other diseases of the digestive system: Secondary | ICD-10-CM | POA: Diagnosis not present

## 2022-07-28 MED ORDER — PREDNISONE 50 MG PO TABS: 50.0000 mg | ORAL_TABLET | Freq: Every day | ORAL | 0 refills | Status: DC

## 2022-07-28 NOTE — Progress Notes (Signed)
[facility] [place], [place] [address] [phone]   New Patient Evaluation  Date of Service: 07/28/22 Patient Name: NICODEMUS SALMI Patient MRN: [id number] Patient DOB: 08-23-1970 Provider: Ventura Sellers, MD  Identifying Statement:  CAYDYN FIESER is a 52 y.o. male with Restless leg syndrome - Plan: Iron and Iron Binding Capacity ([facility] only), Ferritin who presents for initial consultation and evaluation regarding cancer associated neurologic deficits.    Referring Provider: Ventura Sellers, MD [place],  [place] [id number]  Primary Cancer:  Oncologic History: Oncology History  Multiple myeloma not having achieved remission ([place])  02/16/2022 Initial Diagnosis   Multiple myeloma not having achieved remission ([place])   02/26/2022 -  Chemotherapy   Patient is on Treatment Plan : MYELOMA  RVD SQ q21d x 4 cycles       History of Present Illness: The patient's records from the referring physician were obtained and reviewed and the patient interviewed to confirm this HPI.  PANOS MURACA presents to review restles legs complaint.  He describes several months history of irritability, urge to move, restlessness that affects both legs.  Symptoms are worse at night and interfere with sleep.  He obtained an IV "steroid" in the ED 2 months ago which led to at least a week of resolution in symptoms.  Sleep has been only 3-4 hours some nights.  Has been dosing velcade with Dr. Lorenso Courier for ~4 months now, no other issues.  Ropinorole has not helped symptoms at all.  He take gabapentin '100mg'$  TID but "not sure why I'm on that".    Medications: Current Outpatient Medications on File Prior to Visit  Medication Sig Dispense Refill   acyclovir (ZOVIRAX) 400 MG tablet Take 1 tablet (400 mg total) by mouth 2 (two) times daily. 60 tablet 3   allopurinol (ZYLOPRIM) 300 MG tablet Take 1 tablet (300 mg total) by mouth daily. 30  tablet 3   aspirin EC 81 MG tablet Take 81 mg by mouth daily. Swallow whole.     cetirizine (ZYRTEC) 10 MG tablet Take 10 mg by mouth daily as needed for allergies.     clonazePAM (KLONOPIN) 0.5 MG tablet Take 1 tablet (0.5 mg total) by mouth 2 (two) times daily as needed for anxiety. 15 tablet 0   diazepam (VALIUM) 5 MG tablet SMARTSIG:1-2 Tablet(s) By Mouth (Patient not taking: Reported on 07/16/2022)     docusate sodium (COLACE) 100 MG capsule Take 1 capsule (100 mg total) by mouth 2 (two) times daily as needed for mild constipation. 30 capsule 3   DULoxetine (CYMBALTA) 20 MG capsule Take 1 capsule (20 mg total) by mouth daily. 30 capsule 1   gabapentin (NEURONTIN) 100 MG capsule Take 1 capsule (100 mg total) by mouth 2 (two) times daily. 60 capsule 1   hydrochlorothiazide (HYDRODIURIL) 25 MG tablet Take 25 mg by mouth daily.     lisinopril (ZESTRIL) 40 MG tablet Take 40 mg by mouth daily.     Methocarbamol 1000 MG TABS Take 1,000 mg by mouth every 6 (six) hours as needed. (Patient taking differently: Take 1,000 mg by mouth every 6 (six) hours as needed (muscle spasms).) 45 tablet 1   morphine (MS CONTIN) 30 MG 12 hr tablet Take 1 tablet by mouth every 8 hours. (Patient taking differently: Take 30 mg by mouth every 8 (eight) hours as needed for pain.) 60 tablet 0   ondansetron (ZOFRAN) 8  MG tablet Take 1 tablet (8 mg total) by mouth every 8 (eight) hours as needed for nausea or vomiting. 60 tablet 3   pantoprazole (PROTONIX) 40 MG tablet Take 1 tablet (40 mg total) by mouth 2 (two) times daily. 60 tablet 2   polyethylene glycol powder (GLYCOLAX/MIRALAX) 17 GM/SCOOP powder Take 1 capful (17 g) with water by mouth daily. (Patient taking differently: Take 17 g by mouth daily as needed for mild constipation.) 238 g 0   prochlorperazine (COMPAZINE) 10 MG tablet Take 1 tablet (10 mg total) by mouth every 6 (six) hours as needed for nausea or vomiting. 60 tablet 0   REVLIMID 25 MG capsule Take 1 capsule  (25 mg total) by mouth daily. Take one capsule daily x 14 days. None for following 7 days 14 capsule 0   rOPINIRole (REQUIP) 0.25 MG tablet Take 1 tablet (0.25 mg total) by mouth at bedtime as needed for up to 10 days. (Patient taking differently: Take 0.25 mg by mouth at bedtime as needed (restless leg).) 10 tablet 0   rosuvastatin (CRESTOR) 10 MG tablet Take 10 mg by mouth daily.     No current facility-administered medications on file prior to visit.    Allergies: No Known Allergies Past Medical History:  Past Medical History:  Diagnosis Date   Allergy    Anemia    Atypical chest pain    Cancer (HCC)    Multiple myeloma with normocytic anemia   GERD (gastroesophageal reflux disease)    Hepatic cyst    Benign by MRI   Hiatal hernia    History of colonic polyps    Hyperlipidemia    Hypertension    Rectal bleeding    Tobacco abuse    Past Surgical History:  Past Surgical History:  Procedure Laterality Date   COLONOSCOPY  01/10/2009     RMR: Normal rectum, normal colon; repeat in 2015 due to [place] of colon cancer   COLONOSCOPY N/A 01/09/2014   [id number] colonic polyps-removed as described   COLONOSCOPY N/A 09/18/2016   Procedure: COLONOSCOPY;  Surgeon: Daneil Dolin, MD;  [facility];  Service: Endoscopy;  Laterality: N/A;  730    ESOPHAGOGASTRODUODENOSCOPY  10/04/2007   RMR: Distal esophageal erosions consistent with erosive reflux esophagitis, patulous gastroesophageal junction status post passage of a  Maloney dilator, 8 [place].  Otherwise, unremarkable esophagus.  Hiatal hernia.  Otherwise normal stomach.  Bulbar erosion   ESOPHAGOGASTRODUODENOSCOPY N/A 01/09/2014   Erosive reflux esophagitis. Small hiatal hernia   HUMERUS IM NAIL Left 02/22/2022   Procedure: INTRAMEDULLARY (IM) NAIL HUMERAL;  Surgeon: Nicholes Stairs, MD;  Location: [facility];  Service: Orthopedics;  Laterality: Left;   I & D EXTREMITY Left 07/26/2018   Procedure: IRRIGATION AND DEBRIDEMENT  EXTREMITY;  Surgeon: Dayna Barker, MD;  Location: [facility];  Service: Plastics;  Laterality: Left;   IR BONE TUMOR(S)RF ABLATION  02/05/2022   IR BONE TUMOR(S)RF ABLATION  02/24/2022   IR KYPHO EA ADDL LEVEL THORACIC OR LUMBAR  02/19/2022   IR KYPHO LUMBAR INC FX REDUCE BONE BX UNI/BIL CANNULATION INC/IMAGING  02/05/2022   IR KYPHO LUMBAR INC FX REDUCE BONE BX UNI/BIL CANNULATION INC/IMAGING  02/19/2022   PERCUTANEOUS PINNING Left 07/26/2018   Procedure: PERCUTANEOUS PINNING EXTREMITY;  Surgeon: Dayna Barker, MD;  Location: [facility];  Service: Plastics;  Laterality: Left;   Social History:  Social History   Socioeconomic History   Marital status: Divorced    Spouse name: Not on file  Number of children: 2   Years of education: Not on file   Highest education level: Not on file  Occupational History   Occupation: Scientist, 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[place] Use   Smoking status: Every Day    Packs/day: 0.25    Years: 10.00    Total pack years: 2.50    Types: Cigarettes   Smokeless tobacco: Never  Vaping Use   Vaping Use: Never used  Substance and Sexual Activity   Alcohol use: Yes    Alcohol/week: 0.0 standard drinks of alcohol    Comment: occasional/wine on the weekends   Drug use: No   Sexual activity: Not on file  Other Topics Concern   Not on file  Social History Narrative   Right handed   Lives alone one story home   Social Determinants of Health   Financial Resource Strain: Not on file  Food Insecurity: No Food Insecurity (07/17/2022)   Hunger Vital Sign    Worried About Running Out of Food in the Last Year: Never true    Ran Out of Food in the Last Year: Never true  Transportation Needs: No Transportation Needs (07/17/2022)   PRAPARE - Transportation    Lack of Transportation (Medical): No    Lack of Transportation (Non-Medical): No  Physical Activity: Not on file  Stress: Not on file  Social Connections: Not on file   Intimate Partner Violence: Not At Risk (07/17/2022)   Humiliation, Afraid, Rape, and Kick questionnaire    Fear of Current or Ex-Partner: No    Emotionally Abused: No    Physically Abused: No    Sexually Abused: No   Family History:  Family History  Problem Relation Age of Onset   Heart disease Mother    Hypertension Mother    Diabetes Father    Hypertension Father    Colon cancer Sister        passed away from colon cancer, in her 72s   Heart attack Maternal Uncle    Prostate cancer Maternal Uncle    Diabetes Other    Pancreatic cancer Neg Hx    Rectal cancer Neg Hx    Esophageal cancer Neg Hx    Stomach cancer Neg Hx     Review of Systems: Constitutional: Doesn't report fevers, chills or abnormal weight loss Eyes: Doesn't report blurriness of vision Ears, nose, mouth, throat, and face: Doesn't report sore throat Respiratory: Doesn't report cough, dyspnea or wheezes Cardiovascular: Doesn't report palpitation, chest discomfort  Gastrointestinal:  Doesn't report nausea, constipation, diarrhea GU: Doesn't report incontinence Skin: Doesn't report skin rashes Neurological: Per HPI Musculoskeletal: Doesn't report joint pain Behavioral/Psych: Doesn't report anxiety  Physical Exam: Vitals:   07/28/22 1050  BP: 124/77  Pulse: (!) 102  Resp: 20  Temp: (!) 97 F (36.1 C)  SpO2: 99%   KPS: 90. General: Alert, cooperative, pleasant, in no acute distress Head: Normal EENT: No conjunctival injection or scleral icterus.  Lungs: Resp effort normal Cardiac: Regular rate Abdomen: Non-distended abdomen Skin: No rashes cyanosis or petechiae. Extremities: No clubbing or edema  Neurologic Exam: Mental Status: Awake, alert, attentive to examiner. Oriented to self and environment. Language is fluent with intact comprehension.  Cranial Nerves: Visual acuity is grossly normal. Visual fields are full. Extra-ocular movements intact. No ptosis. Face is symmetric Motor: Tone and bulk  are normal. Power is full in both arms and legs. Reflexes are symmetric, no pathologic reflexes  present.  Sensory: Intact to light touch Gait: Normal.   Labs: I have reviewed the data as listed    Component Value Date/Time   NA 134 (L) 07/23/2022 1112   NA 135 12/05/2021 1556   K 3.8 07/23/2022 1112   CL 102 07/23/2022 1112   CO2 26 07/23/2022 1112   GLUCOSE 98 07/23/2022 1112   BUN 10 07/23/2022 1112   BUN 13 12/05/2021 1556   CREATININE 0.75 07/23/2022 1112   CREATININE 1.23 06/01/2012 1150   CALCIUM 9.1 07/23/2022 1112   PROT 6.8 07/23/2022 1112   PROT 9.0 (H) 12/05/2021 1556   ALBUMIN 4.2 07/23/2022 1112   ALBUMIN 3.7 (L) 12/05/2021 1556   AST 11 (L) 07/23/2022 1112   ALT 12 07/23/2022 1112   ALKPHOS 71 07/23/2022 1112   BILITOT 0.4 07/23/2022 1112   GFRNONAA >60 07/23/2022 1112   GFRAA 80 06/10/2019 0849   Lab Results  Component Value Date   WBC 6.7 07/23/2022   NEUTROABS 3.3 07/23/2022   HGB 12.4 (L) 07/23/2022   HCT 36.7 (L) 07/23/2022   MCV 84.8 07/23/2022   PLT 247 07/23/2022    Imaging:  CT ABDOMEN PELVIS W CONTRAST  Result Date: 07/17/2022 CLINICAL DATA:  Generalized abdominal pain. Low back pain. History of multiple myeloma with multilevel thoracic and lumbar kyphoplasty . EXAM: CT ABDOMEN AND PELVIS WITH CONTRAST TECHNIQUE: Multidetector CT imaging of the abdomen and pelvis was performed using the standard protocol following bolus administration of intravenous contrast. RADIATION DOSE REDUCTION: This exam was performed according to the departmental dose-optimization program which includes automated exposure control, adjustment of the mA and/or kV according to patient size and/or use of iterative reconstruction technique. CONTRAST:  175m OMNIPAQUE IOHEXOL 300 MG/ML  SOLN COMPARISON:  Similar study of 07/21/2021. FINDINGS: Lower chest: Minimal posterior atelectasis. Lung bases are otherwise clear. The cardiac size is normal. Hepatobiliary: Stable 1.6 cm cyst  in segment 2 of the left lobe. The liver is 19 cm length mildly steatotic without mass enhancement. Gallbladder is contracted but grossly normal. No biliary dilatation. The hepatic portal main vein again measures prominent at 17 mm. Pancreas: No abnormality. Spleen: No abnormality.  No splenomegaly. Adrenals/Urinary Tract: Adrenal glands are unremarkable. Kidneys are normal, without renal calculi, focal lesion, or hydronephrosis. Bladder is unremarkable. Stomach/Bowel: There is moderate chronic fold thickening in the stomach. Probable chronic gastritis. Endoscopy may be indicated to exclude underlying ulcerative component but this is grossly unchanged. No small bowel dilatation or wall thickening is seen. The appendix is increasingly prominent in caliber measuring 11 mm concerning for appendicitis with portions showing trace adjacent stranding. No appendiceal rupture or abscess is seen. There is moderate stool retention. There are colonic diverticula without evidence for diverticulitis. Vascular/Lymphatic: Anterior to L2 there is a linear radiopacity extending from the anterior vertebral body over to the right and into the lumen of the IVC. This is probably venously intravasated kyphoplasty cement as there appeared to be a cortical vein from L2 in this location emptying into the IVC previously. No other significant vascular findings are seen. No enlarged abdominal, pelvic or inguinal lymph nodes. Reproductive: Prostate is unremarkable. Other: No abdominal wall hernia or abnormality. No abdominopelvic ascites. Musculoskeletal: There is a marked interval change in the bones compared to the prior study. There is new demonstration of diffuse demineralization and heterogeneity in the lower ribs, visualized spine, pelvis and sacrum with innumerable lucent lesions and with vertebral body wedge compression fractures now seen from T10 through L4, moderate  at T10, T12, L3 and L4 and relatively milder L1 and L2, with  preservation of the normal vertebral height of L5. Kyphoplasty cement has been injected at T10, T 12, L1, L2, L3 and 4. Age of the compression fractures is not known but none of this was present 1 year ago. IMPRESSION: 1. The appendix is increasingly prominent in caliber measuring up to 11 mm with portions showing trace adjacent stranding. Findings concerning for acute appendicitis. No appendiceal rupture or abscess is seen. This result was phoned to Dr. Randal Buba at 5:07 a.m., 07/17/2022. 2. Constipation and diverticulosis. 3. Interval demonstration of diffuse demineralization and heterogeneity in the lower ribs, visualized spine, pelvis and sacrum with innumerable lucent lesions and multiple vertebral body wedge compression fractures. Age of the fractures is not known but none of this was present 1 year ago. Multilevel kyphoplasty. 4. Linear radiopacity extending from the anterior L2 vertebral body into the lumen of the IVC. This is probably venously intravasated kyphoplasty cement. 5. Mildly prominent liver with mild steatosis and stable 1.6 cm cyst in segment 2 of the left lobe. 6. Chronic gastritis. Endoscopy may be indicated to exclude underlying ulcerative component but this is grossly unchanged. Electronically Signed   By: Telford Nab M.D.   On: 07/17/2022 05:16      Assessment/Plan Restless leg syndrome - Plan: Iron and Iron Binding Capacity ([facility] only), Ferritin  [name] presents with restless leg syndrome.  Etiology is unclear.  We will check routine iron studies and ferritin given the common association with Fe deficiency.  Role of myeloma, and/or velcade is uncertain at this time.  Some studies have shown more common RLS in multiple myeloma patients.  We will check serum iron studies with next lab draw.  Also counseled on sleep hygiene, role of sleep impairment in exacerbating RLS.  He may discontinue ropinirole due to poor efficacy.    [place] with short course of steroids which  had been effective previously; prednisone '50mg'$  daily x7 days.  We spent twenty additional minutes teaching regarding the natural history, biology, and historical experience in the treatment of neurologic complications of cancer.   We appreciate the opportunity to participate in the care of HONORIO TRUNDY.  We'll give him a call in ~1 week to review labs and eval response to intervention here.    All questions were answered. The patient knows to call the clinic with any problems, questions or concerns. No barriers to learning were detected.  The total time spent in the encounter was 40 minutes and more than 50% was on counseling and review of test results   Ventura Sellers, MD Medical Director of Neuro-Oncology [facility] 07/28/22 11:52 AM

## 2022-07-28 NOTE — Telephone Encounter (Signed)
Per 3/4 LOS scheduled patient, patient aware of date and time of appointment.

## 2022-07-29 ENCOUNTER — Inpatient Hospital Stay (HOSPITAL_BASED_OUTPATIENT_CLINIC_OR_DEPARTMENT_OTHER): Payer: BC Managed Care – PPO | Admitting: Physician Assistant

## 2022-07-29 ENCOUNTER — Inpatient Hospital Stay: Payer: BC Managed Care – PPO

## 2022-07-29 ENCOUNTER — Other Ambulatory Visit: Payer: Self-pay

## 2022-07-29 ENCOUNTER — Encounter: Payer: Self-pay | Admitting: Family Medicine

## 2022-07-29 VITALS — BP 135/83 | HR 93 | Temp 97.2°F | Resp 17 | Wt 222.7 lb

## 2022-07-29 DIAGNOSIS — F17210 Nicotine dependence, cigarettes, uncomplicated: Secondary | ICD-10-CM | POA: Diagnosis not present

## 2022-07-29 DIAGNOSIS — Z79899 Other long term (current) drug therapy: Secondary | ICD-10-CM | POA: Diagnosis not present

## 2022-07-29 DIAGNOSIS — K219 Gastro-esophageal reflux disease without esophagitis: Secondary | ICD-10-CM | POA: Diagnosis not present

## 2022-07-29 DIAGNOSIS — E785 Hyperlipidemia, unspecified: Secondary | ICD-10-CM | POA: Diagnosis not present

## 2022-07-29 DIAGNOSIS — Z7961 Long term (current) use of immunomodulator: Secondary | ICD-10-CM | POA: Diagnosis not present

## 2022-07-29 DIAGNOSIS — C9000 Multiple myeloma not having achieved remission: Secondary | ICD-10-CM

## 2022-07-29 DIAGNOSIS — I10 Essential (primary) hypertension: Secondary | ICD-10-CM | POA: Diagnosis not present

## 2022-07-29 DIAGNOSIS — G2581 Restless legs syndrome: Secondary | ICD-10-CM | POA: Diagnosis not present

## 2022-07-29 DIAGNOSIS — Z5112 Encounter for antineoplastic immunotherapy: Secondary | ICD-10-CM | POA: Diagnosis not present

## 2022-07-29 DIAGNOSIS — Z800 Family history of malignant neoplasm of digestive organs: Secondary | ICD-10-CM | POA: Diagnosis not present

## 2022-07-29 DIAGNOSIS — K5900 Constipation, unspecified: Secondary | ICD-10-CM | POA: Diagnosis not present

## 2022-07-29 DIAGNOSIS — Z86010 Personal history of colonic polyps: Secondary | ICD-10-CM | POA: Diagnosis not present

## 2022-07-29 DIAGNOSIS — K449 Diaphragmatic hernia without obstruction or gangrene: Secondary | ICD-10-CM | POA: Diagnosis not present

## 2022-07-29 DIAGNOSIS — Z79624 Long term (current) use of inhibitors of nucleotide synthesis: Secondary | ICD-10-CM | POA: Diagnosis not present

## 2022-07-29 DIAGNOSIS — Z7982 Long term (current) use of aspirin: Secondary | ICD-10-CM | POA: Diagnosis not present

## 2022-07-29 DIAGNOSIS — Z8719 Personal history of other diseases of the digestive system: Secondary | ICD-10-CM | POA: Diagnosis not present

## 2022-07-29 LAB — CBC WITH DIFFERENTIAL (CANCER CENTER ONLY)
Abs Immature Granulocytes: 0.08 10*3/uL — ABNORMAL HIGH (ref 0.00–0.07)
Basophils Absolute: 0 10*3/uL (ref 0.0–0.1)
Basophils Relative: 0 %
Eosinophils Absolute: 0 10*3/uL (ref 0.0–0.5)
Eosinophils Relative: 0 %
HCT: 37.2 % — ABNORMAL LOW (ref 39.0–52.0)
Hemoglobin: 12.4 g/dL — ABNORMAL LOW (ref 13.0–17.0)
Immature Granulocytes: 1 %
Lymphocytes Relative: 16 %
Lymphs Abs: 1 10*3/uL (ref 0.7–4.0)
MCH: 28.4 pg (ref 26.0–34.0)
MCHC: 33.3 g/dL (ref 30.0–36.0)
MCV: 85.3 fL (ref 80.0–100.0)
Monocytes Absolute: 0.1 10*3/uL (ref 0.1–1.0)
Monocytes Relative: 2 %
Neutro Abs: 4.7 10*3/uL (ref 1.7–7.7)
Neutrophils Relative %: 81 %
Platelet Count: 229 10*3/uL (ref 150–400)
RBC: 4.36 MIL/uL (ref 4.22–5.81)
RDW: 14.5 % (ref 11.5–15.5)
WBC Count: 5.9 10*3/uL (ref 4.0–10.5)
nRBC: 0 % (ref 0.0–0.2)

## 2022-07-29 LAB — CMP (CANCER CENTER ONLY)
ALT: 14 U/L (ref 0–44)
AST: 12 U/L — ABNORMAL LOW (ref 15–41)
Albumin: 4.2 g/dL (ref 3.5–5.0)
Alkaline Phosphatase: 70 U/L (ref 38–126)
Anion gap: 6 (ref 5–15)
BUN: 10 mg/dL (ref 6–20)
CO2: 25 mmol/L (ref 22–32)
Calcium: 9.2 mg/dL (ref 8.9–10.3)
Chloride: 101 mmol/L (ref 98–111)
Creatinine: 0.92 mg/dL (ref 0.61–1.24)
GFR, Estimated: >60 mL/min (ref >60)
Glucose, Bld: 121 mg/dL — ABNORMAL HIGH (ref 70–99)
Potassium: 4.4 mmol/L (ref 3.5–5.1)
Sodium: 132 mmol/L — ABNORMAL LOW (ref 135–145)
Total Bilirubin: 0.5 mg/dL (ref 0.3–1.2)
Total Protein: 7.4 g/dL (ref 6.5–8.1)

## 2022-07-29 LAB — FERRITIN: Ferritin: 457 ng/mL — ABNORMAL HIGH (ref 24–336)

## 2022-07-29 LAB — IRON AND IRON BINDING CAPACITY (CC-WL,HP ONLY)
Iron: 94 ug/dL (ref 45–182)
Saturation Ratios: 23 % (ref 17.9–39.5)
TIBC: 419 ug/dL (ref 250–450)
UIBC: 325 ug/dL (ref 117–376)

## 2022-07-29 MED ORDER — REVLIMID 25 MG PO CAPS: 25.0000 mg | ORAL_CAPSULE | Freq: Every day | ORAL | 0 refills | Status: DC

## 2022-07-29 MED ORDER — DEXAMETHASONE 4 MG PO TABS: 40.0000 mg | ORAL_TABLET | Freq: Once | ORAL | Status: DC

## 2022-07-29 MED ORDER — BORTEZOMIB CHEMO SQ INJECTION 3.5 MG (2.5MG/ML)
1.3000 mg/m2 | Freq: Once | INTRAMUSCULAR | Status: AC
  Administered 2022-07-29: 3 mg via SUBCUTANEOUS
  Filled 2022-07-29: qty 1.2

## 2022-07-29 NOTE — Patient Instructions (Signed)
[facility]  Discharge Instructions: Thank you for choosing [facility] to provide your oncology and hematology care.   If you have a lab appointment with the [facility], please go directly to the [facility] and check in at the registration area.   Wear comfortable clothing and clothing appropriate for easy access to any Portacath or PICC line.   We strive to give you quality time with your provider. You may need to reschedule your appointment if you arrive late (15 or more minutes).  Arriving late affects you and other patients whose appointments are after yours.  Also, if you miss three or more appointments without notifying the office, you may be dismissed from the clinic at the provider's discretion.      For prescription refill requests, have your pharmacy contact our office and allow 72 hours for refills to be completed.    Today you received the following chemotherapy and/or immunotherapy agents velcade      To help prevent nausea and vomiting after your treatment, we encourage you to take your nausea medication as directed.  BELOW ARE SYMPTOMS THAT SHOULD BE REPORTED IMMEDIATELY: *FEVER GREATER THAN 100.4 F (38 C) OR HIGHER *CHILLS OR SWEATING *NAUSEA AND VOMITING THAT IS NOT CONTROLLED WITH YOUR NAUSEA MEDICATION *UNUSUAL SHORTNESS OF BREATH *UNUSUAL BRUISING OR BLEEDING *URINARY PROBLEMS (pain or burning when urinating, or frequent urination) *BOWEL PROBLEMS (unusual diarrhea, constipation, pain near the anus) TENDERNESS IN MOUTH AND THROAT WITH OR WITHOUT PRESENCE OF ULCERS (sore throat, sores in mouth, or a toothache) UNUSUAL RASH, SWELLING OR PAIN  UNUSUAL VAGINAL DISCHARGE OR ITCHING   Items with * indicate a potential emergency and should be followed up as soon as possible or go to the Emergency Department if any problems should occur.  Please show the CHEMOTHERAPY ALERT CARD or IMMUNOTHERAPY ALERT CARD at check-in  to the Emergency Department and triage nurse.  Should you have questions after your visit or need to cancel or reschedule your appointment, please contact [facility]  Dept: [phone]  and follow the prompts.  Office hours are 8:00 a.m. to 4:30 p.m. Monday - Friday. Please note that voicemails left after 4:00 p.m. may not be returned until the following business day.  We are closed weekends and major holidays. You have access to a nurse at all times for urgent questions. Please call the main number to the clinic Dept: [phone] and follow the prompts.   For any non-urgent questions, you may also contact your provider using MyChart. We now offer e-Visits for anyone 36 and older to request care online for non-urgent symptoms. For details visit [email or web address].   Also download the MyChart app! Go to the app store, search "MyChart", open the app, select [facility], and log in with your MyChart username and password.

## 2022-07-29 NOTE — Progress Notes (Signed)
No Dex 40 mg PO today per Dede Query, PA.  Pt had a dose of Prednisone 50 mg today per Rx from Dr. Mickeal Skinner.  Kennith Center, Pharm.D., CPP 07/29/2022'@2'$ :20 PM

## 2022-07-29 NOTE — Telephone Encounter (Signed)
Patient given appt

## 2022-07-29 NOTE — Progress Notes (Signed)
[facility] Telephone:(336) [phone]   Fax:(336) [phone]  PROGRESS NOTE  Patient Care Team: [name], MD as PCP - General (Family Medicine) Lorretta Harp, MD as PCP - Cardiology (Cardiology) Gala Romney Cristopher Estimable, MD as Attending Physician (Gastroenterology)  Hematological/Oncological History # IgG Kappa Multiple Myeloma, 1p32/1q21.  01/04/2022: MRI lumbar spine shows diffuse heterogeneous marrow signal with heterogeneous enhancement throughout the thoracolumbar spine.  Additionally, there is subacute-chronic L3 vertebral body compression fraction. 01/14/2022: establish care with Dr. Lorenso Courier  02/10/2022: bone marrow biopsy shows range of plasma cell involvement from 50% on the biopsy to 60% on aspirate smear slides and close to 90% on the clot section for an overall percentage estimated at approximately 80% overall. 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  02/26/2022: Cycle 1 of VRD therapy 04/01/2022: Cycle 2 of VRD therapy  04/23/2022: Cycle 3 of VRD therapy  05/14/2022: Cycle 4 of VRD therapy  06/04/2022: Underwent kyphoplasty so treatment was cancelled 06/11/2022: Resume Cycle 5 Day 8 of VRD therapy 06/25/2022: Cycle 6 Day 1 of VRD 07/16/2022: Cycle 7 Day 1 of VRD  Interval History:  [name] 52 y.o. male with medical history significant for newly diagnosed IgG kappa multiple myeloma who presents for a follow up visit. The patient's last visit was on 07/16/2022 at which time he continued on VRD therapy. In the interim, he was hospitalized from 07/17/2022-07/18/2022 for acute appendicitis that was managed with conservative treatment and discharged with 5 day course of Augmentin.   On exam today [name] reports feeling well and energy is fairly stable. He does have moments of fatigue which require resting. He is able to complete his ADLs on his own. He denies any appetite or weight changes. He denies nausea or vomiting. He  reports abdominal pain resolved since hospital discharge. He denies any bowel habit changes without recurrent episodes of diarrhea or constipation. He denies easy bruising or signs of active bleeding. He has some residual back pain and is looking into physical therapy/water aerobics to improve his strength. He denies fevers, chills, sweats, shortness of breath, chest pain or cough. He has no other complaints. A full 10 point ROS was otherwise negative.  MEDICAL HISTORY:  Past Medical History:  Diagnosis Date   Allergy    Anemia    Atypical chest pain    Cancer (HCC)    Multiple myeloma with normocytic anemia   GERD (gastroesophageal reflux disease)    Hepatic cyst    Benign by MRI   Hiatal hernia    History of colonic polyps    Hyperlipidemia    Hypertension    Rectal bleeding    Tobacco abuse     SURGICAL HISTORY: Past Surgical History:  Procedure Laterality Date   COLONOSCOPY  01/10/2009     RMR: Normal rectum, normal colon; repeat in 2015 due to [place] of colon cancer   COLONOSCOPY N/A 01/09/2014   [id number] colonic polyps-removed as described   COLONOSCOPY N/A 09/18/2016   Procedure: COLONOSCOPY;  Surgeon: Daneil Dolin, MD;  [facility];  Service: Endoscopy;  Laterality: N/A;  730    ESOPHAGOGASTRODUODENOSCOPY  10/04/2007   RMR: Distal esophageal erosions consistent with erosive reflux esophagitis, patulous gastroesophageal junction status post passage of a  Maloney dilator, 25 [place].  Otherwise, unremarkable esophagus.  Hiatal hernia.  Otherwise normal stomach.  Bulbar erosion   ESOPHAGOGASTRODUODENOSCOPY N/A 01/09/2014   Erosive reflux esophagitis. Small hiatal hernia   HUMERUS IM NAIL Left 02/22/2022  Procedure: INTRAMEDULLARY (IM) NAIL HUMERAL;  Surgeon: Nicholes Stairs, MD;  Location: [facility];  Service: Orthopedics;  Laterality: Left;   I & D EXTREMITY Left 07/26/2018   Procedure: IRRIGATION AND DEBRIDEMENT EXTREMITY;  Surgeon: Dayna Barker, MD;   Location: [facility];  Service: Plastics;  Laterality: Left;   IR BONE TUMOR(S)RF ABLATION  02/05/2022   IR BONE TUMOR(S)RF ABLATION  02/24/2022   IR KYPHO EA ADDL LEVEL THORACIC OR LUMBAR  02/19/2022   IR KYPHO LUMBAR INC FX REDUCE BONE BX UNI/BIL CANNULATION INC/IMAGING  02/05/2022   IR KYPHO LUMBAR INC FX REDUCE BONE BX UNI/BIL CANNULATION INC/IMAGING  02/19/2022   PERCUTANEOUS PINNING Left 07/26/2018   Procedure: PERCUTANEOUS PINNING EXTREMITY;  Surgeon: Dayna Barker, MD;  Location: [facility];  Service: Plastics;  Laterality: Left;    SOCIAL HISTORY: Social History   Socioeconomic History   Marital status: Divorced    Spouse name: Not on file   Number of children: 2   Years of education: Not on file   Highest education level: Not on file  Occupational History   Occupation: Scientist, 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   Smoking status: Every Day    Packs/day: 0.25    Years: 10.00    Total pack years: 2.50    Types: Cigarettes   Smokeless tobacco: Never  Vaping Use   Vaping Use: Never used  Substance and Sexual Activity   Alcohol use: Yes    Alcohol/week: 0.0 standard drinks of alcohol    Comment: occasional/wine on the weekends   Drug use: No   Sexual activity: Not on file  Other Topics Concern   Not on file  Social History Narrative   Right handed   Lives alone one story home   Social Determinants of Health   Financial Resource Strain: Not on file  Food Insecurity: No Food Insecurity (07/17/2022)   Hunger Vital Sign    Worried About Running Out of Food in the Last Year: Never true    Ran Out of Food in the Last Year: Never true  Transportation Needs: No Transportation Needs (07/17/2022)   PRAPARE - Transportation    Lack of Transportation (Medical): No    Lack of Transportation (Non-Medical): No  Physical Activity: Not on file  Stress: Not on file  Social Connections: Not on file  Intimate Partner Violence: Not At Risk  (07/17/2022)   Humiliation, Afraid, Rape, and Kick questionnaire    Fear of Current or Ex-Partner: No    Emotionally Abused: No    Physically Abused: No    Sexually Abused: No    FAMILY HISTORY: Family History  Problem Relation Age of Onset   Heart disease Mother    Hypertension Mother    Diabetes Father    Hypertension Father    Colon cancer Sister        passed away from colon cancer, in her 49s   Heart attack Maternal Uncle    Prostate cancer Maternal Uncle    Diabetes Other    Pancreatic cancer Neg Hx    Rectal cancer Neg Hx    Esophageal cancer Neg Hx    Stomach cancer Neg Hx     ALLERGIES:  has No Known Allergies.  MEDICATIONS:  Current Outpatient Medications  Medication Sig Dispense Refill   acyclovir (ZOVIRAX) 400 MG tablet Take 1 tablet (400 mg total) by mouth 2 (two) times daily. 60 tablet 3  allopurinol (ZYLOPRIM) 300 MG tablet Take 1 tablet (300 mg total) by mouth daily. 30 tablet 3   aspirin EC 81 MG tablet Take 81 mg by mouth daily. Swallow whole.     cetirizine (ZYRTEC) 10 MG tablet Take 10 mg by mouth daily as needed for allergies.     clonazePAM (KLONOPIN) 0.5 MG tablet Take 1 tablet (0.5 mg total) by mouth 2 (two) times daily as needed for anxiety. 15 tablet 0   diazepam (VALIUM) 5 MG tablet SMARTSIG:1-2 Tablet(s) By Mouth (Patient not taking: Reported on 07/16/2022)     docusate sodium (COLACE) 100 MG capsule Take 1 capsule (100 mg total) by mouth 2 (two) times daily as needed for mild constipation. 30 capsule 3   DULoxetine (CYMBALTA) 20 MG capsule Take 1 capsule (20 mg total) by mouth daily. 30 capsule 1   gabapentin (NEURONTIN) 100 MG capsule Take 1 capsule (100 mg total) by mouth 2 (two) times daily. 60 capsule 1   hydrochlorothiazide (HYDRODIURIL) 25 MG tablet Take 25 mg by mouth daily.     lisinopril (ZESTRIL) 40 MG tablet Take 40 mg by mouth daily.     Methocarbamol 1000 MG TABS Take 1,000 mg by mouth every 6 (six) hours as needed. (Patient taking  differently: Take 1,000 mg by mouth every 6 (six) hours as needed (muscle spasms).) 45 tablet 1   morphine (MS CONTIN) 30 MG 12 hr tablet Take 1 tablet by mouth every 8 hours. (Patient taking differently: Take 30 mg by mouth every 8 (eight) hours as needed for pain.) 60 tablet 0   ondansetron (ZOFRAN) 8 MG tablet Take 1 tablet (8 mg total) by mouth every 8 (eight) hours as needed for nausea or vomiting. 60 tablet 3   pantoprazole (PROTONIX) 40 MG tablet Take 1 tablet (40 mg total) by mouth 2 (two) times daily. 60 tablet 2   polyethylene glycol powder (GLYCOLAX/MIRALAX) 17 GM/SCOOP powder Take 1 capful (17 g) with water by mouth daily. (Patient taking differently: Take 17 g by mouth daily as needed for mild constipation.) 238 g 0   predniSONE (DELTASONE) 50 MG tablet Take 1 tablet (50 mg total) by mouth daily with breakfast. 7 tablet 0   prochlorperazine (COMPAZINE) 10 MG tablet Take 1 tablet (10 mg total) by mouth every 6 (six) hours as needed for nausea or vomiting. 60 tablet 0   REVLIMID 25 MG capsule Take 1 capsule (25 mg total) by mouth daily. Take one capsule daily x 14 days. None for following 7 days 14 capsule 0   rOPINIRole (REQUIP) 0.25 MG tablet Take 1 tablet (0.25 mg total) by mouth at bedtime as needed for up to 10 days. (Patient taking differently: Take 0.25 mg by mouth at bedtime as needed (restless leg).) 10 tablet 0   rosuvastatin (CRESTOR) 10 MG tablet Take 10 mg by mouth daily.     No current facility-administered medications for this visit.    REVIEW OF SYSTEMS:   Constitutional: ( - ) fevers, ( - )  chills , ( - ) night sweats Eyes: ( - ) blurriness of vision, ( - ) double vision, ( - ) watery eyes Ears, nose, mouth, throat, and face: ( - ) mucositis, ( - ) sore throat Respiratory: ( - ) cough, ( - ) dyspnea, ( - ) wheezes Cardiovascular: ( - ) palpitation, ( - ) chest discomfort, ( - ) lower extremity swelling Gastrointestinal:  ( - ) nausea, ( - ) heartburn, ( - ) change  in  bowel habits Skin: ( - ) abnormal skin rashes Lymphatics: ( - ) new lymphadenopathy, ( - ) easy bruising Neurological: ( - ) numbness, ( - ) tingling, ( - ) new weaknesses Behavioral/Psych: ( - ) mood change, ( - ) new changes  All other systems were reviewed with the patient and are negative.  PHYSICAL EXAMINATION: ECOG PERFORMANCE STATUS: 1 - Symptomatic but completely ambulatory  Vitals:   07/29/22 1324  BP: 135/83  Pulse: 93  Resp: 17  Temp: (!) 97.2 F (36.2 C)  SpO2: 100%   Filed Weights   07/29/22 1324  Weight: 222 lb 11.2 oz (101 kg)    GENERAL: Well-appearing young African-American male, alert, no distress and comfortable SKIN: skin color, texture, turgor are normal, no rashes or significant lesions EYES: conjunctiva are pink and non-injected, sclera clear LUNGS: clear to auscultation and percussion with normal breathing effort HEART: regular rate & rhythm and no murmurs and no lower extremity edema Musculoskeletal: no cyanosis of digits and no clubbing  PSYCH: alert & oriented x 3, fluent speech NEURO: no focal motor/sensory deficits  LABORATORY DATA:  I have reviewed the data as listed    Latest Ref Rng & Units 07/29/2022   12:32 PM 07/23/2022   11:12 AM 07/18/2022    3:33 AM  CBC  WBC 4.0 - 10.5 K/uL 5.9  6.7  6.8   Hemoglobin 13.0 - 17.0 g/dL 12.4  12.4  10.2   Hematocrit 39.0 - 52.0 % 37.2  36.7  32.0   Platelets 150 - 400 K/uL 229  247  153        Latest Ref Rng & Units 07/29/2022   12:32 PM 07/23/2022   11:12 AM 07/18/2022    3:33 AM  CMP  Glucose 70 - 99 mg/dL 121  98  107   BUN 6 - 20 mg/dL '10  10  17   '$ Creatinine 0.61 - 1.24 mg/dL 0.92  0.75  1.07   Sodium 135 - 145 mmol/L 132  134  135   Potassium 3.5 - 5.1 mmol/L 4.4  3.8  4.2   Chloride 98 - 111 mmol/L 101  102  106   CO2 22 - 32 mmol/L '25  26  24   '$ Calcium 8.9 - 10.3 mg/dL 9.2  9.1  8.2   Total Protein 6.5 - 8.1 g/dL 7.4  6.8  5.8   Total Bilirubin 0.3 - 1.2 mg/dL 0.5  0.4  0.5   Alkaline  Phos 38 - 126 U/L 70  71  45   AST 15 - 41 U/L '12  11  13   '$ ALT 0 - 44 U/L '14  12  13     '$ Lab Results  Component Value Date   MPROTEIN 0.5 (H) 07/16/2022   MPROTEIN 0.8 (H) 06/18/2022   MPROTEIN 0.8 (H) 05/13/2022   Lab Results  Component Value Date   KPAFRELGTCHN 14.0 07/16/2022   KPAFRELGTCHN 24.0 (H) 06/25/2022   KPAFRELGTCHN 39.3 (H) 05/13/2022   LAMBDASER 9.1 07/16/2022   LAMBDASER 9.9 06/25/2022   LAMBDASER 13.4 05/13/2022   KAPLAMBRATIO 1.54 07/16/2022   KAPLAMBRATIO 2.42 (H) 06/25/2022   KAPLAMBRATIO 2.93 (H) 05/13/2022   RADIOGRAPHIC STUDIES: CT ABDOMEN PELVIS W CONTRAST  Result Date: 07/17/2022 CLINICAL DATA:  Generalized abdominal pain. Low back pain. History of multiple myeloma with multilevel thoracic and lumbar kyphoplasty . EXAM: CT ABDOMEN AND PELVIS WITH CONTRAST TECHNIQUE: Multidetector CT imaging of the abdomen and pelvis was performed using  the standard protocol following bolus administration of intravenous contrast. RADIATION DOSE REDUCTION: This exam was performed according to the departmental dose-optimization program which includes automated exposure control, adjustment of the mA and/or kV according to patient size and/or use of iterative reconstruction technique. CONTRAST:  139m OMNIPAQUE IOHEXOL 300 MG/ML  SOLN COMPARISON:  Similar study of 07/21/2021. FINDINGS: Lower chest: Minimal posterior atelectasis. Lung bases are otherwise clear. The cardiac size is normal. Hepatobiliary: Stable 1.6 cm cyst in segment 2 of the left lobe. The liver is 19 cm length mildly steatotic without mass enhancement. Gallbladder is contracted but grossly normal. No biliary dilatation. The hepatic portal main vein again measures prominent at 17 mm. Pancreas: No abnormality. Spleen: No abnormality.  No splenomegaly. Adrenals/Urinary Tract: Adrenal glands are unremarkable. Kidneys are normal, without renal calculi, focal lesion, or hydronephrosis. Bladder is unremarkable. Stomach/Bowel:  There is moderate chronic fold thickening in the stomach. Probable chronic gastritis. Endoscopy may be indicated to exclude underlying ulcerative component but this is grossly unchanged. No small bowel dilatation or wall thickening is seen. The appendix is increasingly prominent in caliber measuring 11 mm concerning for appendicitis with portions showing trace adjacent stranding. No appendiceal rupture or abscess is seen. There is moderate stool retention. There are colonic diverticula without evidence for diverticulitis. Vascular/Lymphatic: Anterior to L2 there is a linear radiopacity extending from the anterior vertebral body over to the right and into the lumen of the IVC. This is probably venously intravasated kyphoplasty cement as there appeared to be a cortical vein from L2 in this location emptying into the IVC previously. No other significant vascular findings are seen. No enlarged abdominal, pelvic or inguinal lymph nodes. Reproductive: Prostate is unremarkable. Other: No abdominal wall hernia or abnormality. No abdominopelvic ascites. Musculoskeletal: There is a marked interval change in the bones compared to the prior study. There is new demonstration of diffuse demineralization and heterogeneity in the lower ribs, visualized spine, pelvis and sacrum with innumerable lucent lesions and with vertebral body wedge compression fractures now seen from T10 through L4, moderate at T10, T12, L3 and L4 and relatively milder L1 and L2, with preservation of the normal vertebral height of L5. Kyphoplasty cement has been injected at T10, T 12, L1, L2, L3 and 4. Age of the compression fractures is not known but none of this was present 1 year ago. IMPRESSION: 1. The appendix is increasingly prominent in caliber measuring up to 11 mm with portions showing trace adjacent stranding. Findings concerning for acute appendicitis. No appendiceal rupture or abscess is seen. This result was phoned to Dr. PRandal Bubaat 5:07 a.m.,  07/17/2022. 2. Constipation and diverticulosis. 3. Interval demonstration of diffuse demineralization and heterogeneity in the lower ribs, visualized spine, pelvis and sacrum with innumerable lucent lesions and multiple vertebral body wedge compression fractures. Age of the fractures is not known but none of this was present 1 year ago. Multilevel kyphoplasty. 4. Linear radiopacity extending from the anterior L2 vertebral body into the lumen of the IVC. This is probably venously intravasated kyphoplasty cement. 5. Mildly prominent liver with mild steatosis and stable 1.6 cm cyst in segment 2 of the left lobe. 6. Chronic gastritis. Endoscopy may be indicated to exclude underlying ulcerative component but this is grossly unchanged. Electronically Signed   By: KTelford NabM.D.   On: 07/17/2022 05:16    ASSESSMENT & PLAN SDARAY CRUMPLEY89y.o. male with medical history significant for IgG kappa multiple myeloma who presents for a follow up visit.  After review of the labs, review the records, discussion with the patient the findings are most consistent with newly diagnosed multiple myeloma.  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  At this time would recommend proceeding with VRD chemotherapy with the intention of referral for ASCT when his labs are appropriate.  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 -- Recommend VRD chemotherapy with intention of proceeding to transplant. -- Labs at each visit to include CBC, CMP, LDH with monthly restaging labs SPEP and serum free light chains -- Started VRD therapy on 02/26/2022.  PLAN: --Presents today to start Cycle 7, Day 15 of VRD therapy --Labs from today were reviewed and adequate for treatment. WBC 5.9, hemoglobin 12.4, MCV 85.3, and platelets 229. --Most  recent myeloma labs from 07/16/2022 show continued improvement of M protein measuring 0.5 g/dL and normalized serum free light chains.  --Proceed with treatment today as planned but will hold his dexamethsone dose today since he started a 7 day course of prednisone 50 mg daily today. --Continue with weekly treatments and follow up in clinic in 2 weeks.   # Leg Discomfort/Restless Leg -- Patient provided with Requip by the emergency department, discontinued due to poor efficacy. --Evaluated by Dr. Mickeal Skinner on 07/28/2022. Recommended to short course of steroids so started prednisone 50 mg daily x 7 days today. Discussed etiologies including iron deficiency so iron levels were checked today, pending results.   #Constipation: --Recommend to continue on colace but add miralax as well.   #Pathologic fractures-secondary to MM: --Involving L1, L2 and L3 compression fracture and left humerus fracture.  --Underwent kyphoplasty of L3 fracture on 02/19/2022 --Underwent medullary nailing of left humeral shaft on 02/22/2022 --Underwent kyphoplasty on 06/04/2022.   #Pain Medication -- Per palliative care continue on MS contin 60 mg q 8 hours, oxycodone 20 mg q 6 hours,  robaxin 1000 mg QID.  --Appreciate assistance of palliative care NP for further management of pain and other chronic symptoms.   #Supportive Care -- chemotherapy education complete -- port placement not required -- zofran '8mg'$  q8H PRN and compazine '10mg'$  PO q6H for nausea -- acyclovir '400mg'$  PO BID for VCZ prophylaxis -- allopurinol '300mg'$  PO daily for TLS prophylaxis -- Dental clearance for Zometa/Denosumab approved. Last dose of denosumab 07/16/2022. Continue monthly.   No orders of the defined types were placed in this encounter.   All questions were answered. The patient knows to call the clinic with any problems, questions or concerns.  I have spent a total of 30 minutes minutes of face-to-face and non-face-to-face time, preparing to see  the [facility] a medically appropriate examination, counseling and educating the patient, ordering medications/tests, documenting clinical information in the electronic health record,  and care coordination.   Dede Query PA-C Dept of Hematology and [place] at [facility] Phone: [phone]   07/29/2022 1:30 PM

## 2022-07-30 ENCOUNTER — Inpatient Hospital Stay: Payer: BC Managed Care – PPO

## 2022-07-30 ENCOUNTER — Inpatient Hospital Stay: Payer: BC Managed Care – PPO | Admitting: Hematology and Oncology

## 2022-07-30 ENCOUNTER — Inpatient Hospital Stay: Payer: BC Managed Care – PPO | Admitting: Physician Assistant

## 2022-07-31 ENCOUNTER — Encounter: Payer: Self-pay | Admitting: Family Medicine

## 2022-07-31 ENCOUNTER — Ambulatory Visit (INDEPENDENT_AMBULATORY_CARE_PROVIDER_SITE_OTHER): Payer: BC Managed Care – PPO | Admitting: Family Medicine

## 2022-07-31 ENCOUNTER — Encounter: Payer: Self-pay | Admitting: Gastroenterology

## 2022-07-31 VITALS — BP 139/87 | HR 81 | Temp 98.1°F | Resp 16 | Wt 225.2 lb

## 2022-07-31 DIAGNOSIS — F419 Anxiety disorder, unspecified: Secondary | ICD-10-CM | POA: Diagnosis not present

## 2022-07-31 DIAGNOSIS — F32A Depression, unspecified: Secondary | ICD-10-CM

## 2022-07-31 DIAGNOSIS — K219 Gastro-esophageal reflux disease without esophagitis: Secondary | ICD-10-CM | POA: Diagnosis not present

## 2022-07-31 DIAGNOSIS — I10 Essential (primary) hypertension: Secondary | ICD-10-CM | POA: Diagnosis not present

## 2022-07-31 MED ORDER — CLONAZEPAM 0.5 MG PO TABS: 0.5000 mg | ORAL_TABLET | Freq: Two times a day (BID) | ORAL | 0 refills | Status: DC | PRN

## 2022-08-02 DIAGNOSIS — M4802 Spinal stenosis, cervical region: Secondary | ICD-10-CM | POA: Diagnosis not present

## 2022-08-02 DIAGNOSIS — M6281 Muscle weakness (generalized): Secondary | ICD-10-CM | POA: Diagnosis not present

## 2022-08-02 DIAGNOSIS — C9000 Multiple myeloma not having achieved remission: Secondary | ICD-10-CM | POA: Diagnosis not present

## 2022-08-02 DIAGNOSIS — R2689 Other abnormalities of gait and mobility: Secondary | ICD-10-CM | POA: Diagnosis not present

## 2022-08-04 MED ORDER — ROSUVASTATIN CALCIUM 10 MG PO TABS: 10.0000 mg | ORAL_TABLET | Freq: Every day | ORAL | 1 refills | Status: DC

## 2022-08-05 ENCOUNTER — Inpatient Hospital Stay (HOSPITAL_BASED_OUTPATIENT_CLINIC_OR_DEPARTMENT_OTHER): Payer: BC Managed Care – PPO | Admitting: Internal Medicine

## 2022-08-05 ENCOUNTER — Encounter: Payer: Self-pay | Admitting: Family Medicine

## 2022-08-05 DIAGNOSIS — G2581 Restless legs syndrome: Secondary | ICD-10-CM

## 2022-08-05 NOTE — Progress Notes (Signed)
I connected with [name] on 08/05/22 at 10:00 AM EDT by telephone visit and verified that I am speaking with the correct person using two identifiers.  I discussed the limitations, risks, security and privacy concerns of performing an evaluation and management service by telemedicine and the availability of in-person appointments. I also discussed with the patient that there may be a patient responsible charge related to this service. The patient expressed understanding and agreed to proceed.  Other persons participating in the visit and their role in the encounter:  n/a  Patient's location:  Home Provider's location:  Office Chief Complaint:  Restless leg syndrome  History of Present Ilness: [name] reports improvement in restless legs since visit last week.  The steroids helped.  He has had no recurrence in pain or restlessness.  Continues on velcade with Dr. Lorenso Courier  Observations: Language and cognition at baseline  Assessment and Plan: Restless leg syndrome  Clinically improved, may remain of ropinorole as discussed.  Follow Up Instructions:  RTC as needed  I discussed the assessment and treatment plan with the patient.  The patient was provided an opportunity to ask questions and all were answered.  The patient agreed with the plan and demonstrated understanding of the instructions.    The patient was advised to call back or seek an in-person evaluation if the symptoms worsen or if the condition fails to improve as anticipated.    Ventura Sellers, MD   I provided 15 minutes of non face-to-face telephone visit time during this encounter, and > 50% was spent counseling as documented under my assessment & plan.

## 2022-08-05 NOTE — Progress Notes (Signed)
Estblished Patient Office Visit  Subjective    Patient ID: [name], male    DOB: [birth year]  Age: 52 y.o. MRN: [id number]  CC: No chief complaint on file.   HPI LARMAR STREICH presents for follow up of chronic med issues.    Outpatient Encounter Medications as of 07/31/2022  Medication Sig   acyclovir (ZOVIRAX) 400 MG tablet Take 1 tablet (400 mg total) by mouth 2 (two) times daily.   allopurinol (ZYLOPRIM) 300 MG tablet Take 1 tablet (300 mg total) by mouth daily.   aspirin EC 81 MG tablet Take 81 mg by mouth daily. Swallow whole.   cetirizine (ZYRTEC) 10 MG tablet Take 10 mg by mouth daily as needed for allergies.   clonazePAM (KLONOPIN) 0.5 MG tablet Take 1 tablet (0.5 mg total) by mouth 2 (two) times daily as needed for anxiety.   docusate sodium (COLACE) 100 MG capsule Take 1 capsule (100 mg total) by mouth 2 (two) times daily as needed for mild constipation.   DULoxetine (CYMBALTA) 20 MG capsule Take 1 capsule (20 mg total) by mouth daily.   gabapentin (NEURONTIN) 100 MG capsule Take 1 capsule (100 mg total) by mouth 2 (two) times daily.   hydrochlorothiazide (HYDRODIURIL) 25 MG tablet Take 25 mg by mouth daily.   lisinopril (ZESTRIL) 40 MG tablet Take 40 mg by mouth daily.   Methocarbamol 1000 MG TABS Take 1,000 mg by mouth every 6 (six) hours as needed. (Patient taking differently: Take 1,000 mg by mouth every 6 (six) hours as needed (muscle spasms).)   morphine (MS CONTIN) 30 MG 12 hr tablet Take 1 tablet by mouth every 8 hours. (Patient taking differently: Take 30 mg by mouth every 8 (eight) hours as needed for pain.)   ondansetron (ZOFRAN) 8 MG tablet Take 1 tablet (8 mg total) by mouth every 8 (eight) hours as needed for nausea or vomiting.   pantoprazole (PROTONIX) 40 MG tablet Take 1 tablet (40 mg total) by mouth 2 (two) times daily.   polyethylene glycol powder (GLYCOLAX/MIRALAX) 17 GM/SCOOP powder Take 1 capful (17 g) with water by mouth daily. (Patient taking  differently: Take 17 g by mouth daily as needed for mild constipation.)   predniSONE (DELTASONE) 50 MG tablet Take 1 tablet (50 mg total) by mouth daily with breakfast.   prochlorperazine (COMPAZINE) 10 MG tablet Take 1 tablet (10 mg total) by mouth every 6 (six) hours as needed for nausea or vomiting.   REVLIMID 25 MG capsule Take 1 capsule (25 mg total) by mouth daily. Take one capsule daily x 14 days. None for following 7 days   [DISCONTINUED] clonazePAM (KLONOPIN) 0.5 MG tablet Take 1 tablet (0.5 mg total) by mouth 2 (two) times daily as needed for anxiety.   [DISCONTINUED] diazepam (VALIUM) 5 MG tablet SMARTSIG:1-2 Tablet(s) By Mouth (Patient not taking: Reported on 07/16/2022)   [DISCONTINUED] rosuvastatin (CRESTOR) 10 MG tablet Take 10 mg by mouth daily.   No facility-administered encounter medications on file as of 07/31/2022.    Past Medical History:  Diagnosis Date   Allergy    Anemia    Atypical chest pain    Cancer (HCC)    Multiple myeloma with normocytic anemia   GERD (gastroesophageal reflux disease)    Hepatic cyst    Benign by MRI   Hiatal hernia    History of colonic polyps    Hyperlipidemia    Hypertension    Rectal bleeding    Tobacco abuse  Past Surgical History:  Procedure Laterality Date   COLONOSCOPY  01/10/2009     RMR: Normal rectum, normal colon; repeat in 2015 due to [place] of colon cancer   COLONOSCOPY N/A 01/09/2014   [id number] colonic polyps-removed as described   COLONOSCOPY N/A 09/18/2016   Procedure: COLONOSCOPY;  Surgeon: Daneil Dolin, MD;  [facility];  Service: Endoscopy;  Laterality: N/A;  730    ESOPHAGOGASTRODUODENOSCOPY  10/04/2007   RMR: Distal esophageal erosions consistent with erosive reflux esophagitis, patulous gastroesophageal junction status post passage of a  [place] dilator, 29 [place].  Otherwise, unremarkable esophagus.  Hiatal hernia.  Otherwise normal stomach.  Bulbar erosion   ESOPHAGOGASTRODUODENOSCOPY N/A  01/09/2014   Erosive reflux esophagitis. Small hiatal hernia   HUMERUS IM NAIL Left 02/22/2022   Procedure: INTRAMEDULLARY (IM) NAIL HUMERAL;  Surgeon: Nicholes Stairs, MD;  Location: [facility];  Service: Orthopedics;  Laterality: Left;   I & D EXTREMITY Left 07/26/2018   Procedure: IRRIGATION AND DEBRIDEMENT EXTREMITY;  Surgeon: Dayna Barker, MD;  Location: [facility];  Service: Plastics;  Laterality: Left;   IR BONE TUMOR(S)RF ABLATION  02/05/2022   IR BONE TUMOR(S)RF ABLATION  02/24/2022   IR KYPHO EA ADDL LEVEL THORACIC OR LUMBAR  02/19/2022   IR KYPHO LUMBAR INC FX REDUCE BONE BX UNI/BIL CANNULATION INC/IMAGING  02/05/2022   IR KYPHO LUMBAR INC FX REDUCE BONE BX UNI/BIL CANNULATION INC/IMAGING  02/19/2022   PERCUTANEOUS PINNING Left 07/26/2018   Procedure: PERCUTANEOUS PINNING EXTREMITY;  Surgeon: Dayna Barker, MD;  Location: [facility];  Service: Plastics;  Laterality: Left;    Family History  Problem Relation Age of Onset   Heart disease Mother    Hypertension Mother    Diabetes Father    Hypertension Father    Colon cancer Sister        passed away from colon cancer, in her 61s   Heart attack Maternal Uncle    Prostate cancer Maternal Uncle    Diabetes Other    Pancreatic cancer Neg Hx    Rectal cancer Neg Hx    Esophageal cancer Neg Hx    Stomach cancer Neg Hx     Social History   Socioeconomic History   Marital status: Divorced    Spouse name: Not on file   Number of children: 2   Years of education: Not on file   Highest education level: Not on file  Occupational History   Occupation: Scientist, 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   Smoking status: Former    Packs/day: 0.25    Years: 10.00    Total pack years: 2.50    Types: Cigarettes    Quit date: 12/24/2021    Years since quitting: 0.6   Smokeless tobacco: Never  Vaping Use   Vaping Use: Never used  Substance and Sexual Activity   Alcohol use: Yes     Alcohol/week: 0.0 standard drinks of alcohol    Comment: occasional/wine on the weekends   Drug use: No   Sexual activity: Not on file  Other Topics Concern   Not on file  Social History Narrative   Right handed   Lives alone one story home   Social Determinants of Health   Financial Resource Strain: Not on file  Food Insecurity: No Food Insecurity (07/17/2022)   Hunger Vital Sign    Worried About Running Out of Food in the Last Year: Never true  Ran Out of Food in the Last Year: Never true  Transportation Needs: No Transportation Needs (07/17/2022)   PRAPARE - Hydrologist (Medical): No    Lack of Transportation (Non-Medical): No  Physical Activity: Not on file  Stress: Not on file  Social Connections: Not on file  Intimate Partner Violence: Not At Risk (07/17/2022)   Humiliation, Afraid, Rape, and Kick questionnaire    Fear of Current or Ex-Partner: No    Emotionally Abused: No    Physically Abused: No    Sexually Abused: No    Review of Systems  All other systems reviewed and are negative.       Objective    BP 139/87   Pulse 81   Temp 98.1 F (36.7 C) (Oral)   Resp 16   Wt 225 lb 3.2 oz (102.2 kg)   SpO2 96%   BMI 33.26 kg/m   Physical Exam Vitals and nursing note reviewed.  Constitutional:      General: He is not in acute distress. Cardiovascular:     Rate and Rhythm: Normal rate and regular rhythm.  Pulmonary:     Effort: Pulmonary effort is normal.     Breath sounds: Normal breath sounds.  Abdominal:     Palpations: Abdomen is soft.     Tenderness: There is no abdominal tenderness.  Neurological:     General: No focal deficit present.     Mental Status: He is alert and oriented to person, place, and time.         Assessment & Plan:   1. Essential hypertension Appears stable. continue  2. Anxiety and depression Continue cymbalta. Clonazepam refilled.   3. GERD without esophagitis Continue   No  follow-ups on file.   Becky Sax, MD

## 2022-08-06 ENCOUNTER — Inpatient Hospital Stay: Payer: BC Managed Care – PPO

## 2022-08-06 ENCOUNTER — Other Ambulatory Visit: Payer: Self-pay

## 2022-08-06 VITALS — BP 138/78 | HR 78 | Temp 98.0°F | Resp 18 | Wt 224.4 lb

## 2022-08-06 DIAGNOSIS — F17210 Nicotine dependence, cigarettes, uncomplicated: Secondary | ICD-10-CM | POA: Diagnosis not present

## 2022-08-06 DIAGNOSIS — I10 Essential (primary) hypertension: Secondary | ICD-10-CM | POA: Diagnosis not present

## 2022-08-06 DIAGNOSIS — K219 Gastro-esophageal reflux disease without esophagitis: Secondary | ICD-10-CM | POA: Diagnosis not present

## 2022-08-06 DIAGNOSIS — Z7961 Long term (current) use of immunomodulator: Secondary | ICD-10-CM | POA: Diagnosis not present

## 2022-08-06 DIAGNOSIS — Z5112 Encounter for antineoplastic immunotherapy: Secondary | ICD-10-CM | POA: Diagnosis not present

## 2022-08-06 DIAGNOSIS — Z86010 Personal history of colonic polyps: Secondary | ICD-10-CM | POA: Diagnosis not present

## 2022-08-06 DIAGNOSIS — K5900 Constipation, unspecified: Secondary | ICD-10-CM | POA: Diagnosis not present

## 2022-08-06 DIAGNOSIS — E785 Hyperlipidemia, unspecified: Secondary | ICD-10-CM | POA: Diagnosis not present

## 2022-08-06 DIAGNOSIS — C9000 Multiple myeloma not having achieved remission: Secondary | ICD-10-CM

## 2022-08-06 DIAGNOSIS — Z7982 Long term (current) use of aspirin: Secondary | ICD-10-CM | POA: Diagnosis not present

## 2022-08-06 DIAGNOSIS — Z79899 Other long term (current) drug therapy: Secondary | ICD-10-CM | POA: Diagnosis not present

## 2022-08-06 DIAGNOSIS — Z79624 Long term (current) use of inhibitors of nucleotide synthesis: Secondary | ICD-10-CM | POA: Diagnosis not present

## 2022-08-06 DIAGNOSIS — Z8719 Personal history of other diseases of the digestive system: Secondary | ICD-10-CM | POA: Diagnosis not present

## 2022-08-06 DIAGNOSIS — Z800 Family history of malignant neoplasm of digestive organs: Secondary | ICD-10-CM | POA: Diagnosis not present

## 2022-08-06 DIAGNOSIS — G2581 Restless legs syndrome: Secondary | ICD-10-CM | POA: Diagnosis not present

## 2022-08-06 DIAGNOSIS — K449 Diaphragmatic hernia without obstruction or gangrene: Secondary | ICD-10-CM | POA: Diagnosis not present

## 2022-08-06 LAB — CBC WITH DIFFERENTIAL (CANCER CENTER ONLY)
Abs Immature Granulocytes: 0.09 10*3/uL — ABNORMAL HIGH (ref 0.00–0.07)
Basophils Absolute: 0 10*3/uL (ref 0.0–0.1)
Basophils Relative: 0 %
Eosinophils Absolute: 0 10*3/uL (ref 0.0–0.5)
Eosinophils Relative: 0 %
HCT: 42 % (ref 39.0–52.0)
Hemoglobin: 13.8 g/dL (ref 13.0–17.0)
Immature Granulocytes: 1 %
Lymphocytes Relative: 23 %
Lymphs Abs: 1.6 10*3/uL (ref 0.7–4.0)
MCH: 28 pg (ref 26.0–34.0)
MCHC: 32.9 g/dL (ref 30.0–36.0)
MCV: 85.2 fL (ref 80.0–100.0)
Monocytes Absolute: 0.7 10*3/uL (ref 0.1–1.0)
Monocytes Relative: 11 %
Neutro Abs: 4.4 10*3/uL (ref 1.7–7.7)
Neutrophils Relative %: 65 %
Platelet Count: 241 10*3/uL (ref 150–400)
RBC: 4.93 MIL/uL (ref 4.22–5.81)
RDW: 14.3 % (ref 11.5–15.5)
WBC Count: 6.9 10*3/uL (ref 4.0–10.5)
nRBC: 0 % (ref 0.0–0.2)

## 2022-08-06 LAB — CMP (CANCER CENTER ONLY)
ALT: 17 U/L (ref 0–44)
AST: 13 U/L — ABNORMAL LOW (ref 15–41)
Albumin: 4.3 g/dL (ref 3.5–5.0)
Alkaline Phosphatase: 65 U/L (ref 38–126)
Anion gap: 7 (ref 5–15)
BUN: 12 mg/dL (ref 6–20)
CO2: 26 mmol/L (ref 22–32)
Calcium: 8.9 mg/dL (ref 8.9–10.3)
Chloride: 100 mmol/L (ref 98–111)
Creatinine: 1 mg/dL (ref 0.61–1.24)
GFR, Estimated: >60 mL/min (ref >60)
Glucose, Bld: 127 mg/dL — ABNORMAL HIGH (ref 70–99)
Potassium: 3.7 mmol/L (ref 3.5–5.1)
Sodium: 133 mmol/L — ABNORMAL LOW (ref 135–145)
Total Bilirubin: 0.6 mg/dL (ref 0.3–1.2)
Total Protein: 7.4 g/dL (ref 6.5–8.1)

## 2022-08-06 MED ORDER — BORTEZOMIB CHEMO SQ INJECTION 3.5 MG (2.5MG/ML)
1.3000 mg/m2 | Freq: Once | INTRAMUSCULAR | Status: AC
  Administered 2022-08-06: 3 mg via SUBCUTANEOUS
  Filled 2022-08-06: qty 1.2

## 2022-08-06 MED ORDER — DEXAMETHASONE 4 MG PO TABS
40.0000 mg | ORAL_TABLET | Freq: Once | ORAL | Status: AC
  Administered 2022-08-06: 40 mg via ORAL
  Filled 2022-08-06: qty 10

## 2022-08-06 NOTE — Patient Instructions (Signed)
[facility]  Discharge Instructions: Thank you for choosing [facility] to provide your oncology and hematology care.   If you have a lab appointment with the Cancer Center, please go directly to the Cancer Center and check in at the registration area.   Wear comfortable clothing and clothing appropriate for easy access to any Portacath or PICC line.   We strive to give you quality time with your provider. You may need to reschedule your appointment if you arrive late (15 or more minutes).  Arriving late affects you and other patients whose appointments are after yours.  Also, if you miss three or more appointments without notifying the office, you may be dismissed from the clinic at the provider's discretion.      For prescription refill requests, have your pharmacy contact our office and allow 72 hours for refills to be completed.    Today you received the following chemotherapy and/or immunotherapy agents velcade      To help prevent nausea and vomiting after your treatment, we encourage you to take your nausea medication as directed.  BELOW ARE SYMPTOMS THAT SHOULD BE REPORTED IMMEDIATELY: *FEVER GREATER THAN 100.4 F (38 C) OR HIGHER *CHILLS OR SWEATING *NAUSEA AND VOMITING THAT IS NOT CONTROLLED WITH YOUR NAUSEA MEDICATION *UNUSUAL SHORTNESS OF BREATH *UNUSUAL BRUISING OR BLEEDING *URINARY PROBLEMS (pain or burning when urinating, or frequent urination) *BOWEL PROBLEMS (unusual diarrhea, constipation, pain near the anus) TENDERNESS IN MOUTH AND THROAT WITH OR WITHOUT PRESENCE OF ULCERS (sore throat, sores in mouth, or a toothache) UNUSUAL RASH, SWELLING OR PAIN  UNUSUAL VAGINAL DISCHARGE OR ITCHING   Items with * indicate a potential emergency and should be followed up as soon as possible or go to the Emergency Department if any problems should occur.  Please show the CHEMOTHERAPY ALERT CARD or IMMUNOTHERAPY ALERT CARD at check-in  to the Emergency Department and triage nurse.  Should you have questions after your visit or need to cancel or reschedule your appointment, please contact [facility]  Dept: [phone]  and follow the prompts.  Office hours are 8:00 a.m. to 4:30 p.m. Monday - Friday. Please note that voicemails left after 4:00 p.m. may not be returned until the following business day.  We are closed weekends and major holidays. You have access to a nurse at all times for urgent questions. Please call the main number to the clinic Dept: [phone] and follow the prompts.   For any non-urgent questions, you may also contact your provider using MyChart. We now offer e-Visits for anyone 18 and older to request care online for non-urgent symptoms. For details visit [email or web address].   Also download the MyChart app! Go to the app store, search "MyChart", open the app, select [facility], and log in with your MyChart username and password.   

## 2022-08-07 LAB — KAPPA/LAMBDA LIGHT CHAINS
Kappa free light chain: 17.5 mg/L (ref 3.3–19.4)
Kappa, lambda light chain ratio: 1.92 — ABNORMAL HIGH (ref 0.26–1.65)
Lambda free light chains: 9.1 mg/L (ref 5.7–26.3)

## 2022-08-08 NOTE — Progress Notes (Unsigned)
[name]  [phone] [phone]   Name: [name] Date: 08/08/2022 MRN: [id number]  DOB: 1970/10/19  Patient Care Team: [name], MD as PCP - General (Family Medicine) Lorretta Harp, MD as PCP - Cardiology (Cardiology) Gala Romney Cristopher Estimable, MD as Attending Physician (Gastroenterology)     INTERVAL HISTORY: TILGHMAN [name] is a 52 y.o. male with oncologic medical history including IgG Kappa Multiple Myeloma, L1, L2, and L3 vertebral compression fraction s/p kyphoplasty(9/27) and IM nailing of left humeral shaft (9/30), currently undergoing VRD therapy.  Palliative ask to see for symptom management.   SOCIAL HISTORY:     reports that he quit smoking about 7 months ago. His smoking use included cigarettes. He has a 2.50 pack-year smoking history. He has never used smokeless tobacco. He reports current alcohol use. He reports that he does not use drugs.  ADVANCE DIRECTIVES:    CODE STATUS:   PAST MEDICAL HISTORY: Past Medical History:  Diagnosis Date   Allergy    Anemia    Atypical chest pain    Cancer (HCC)    Multiple myeloma with normocytic anemia   GERD (gastroesophageal reflux disease)    Hepatic cyst    Benign by MRI   Hiatal hernia    History of colonic polyps    Hyperlipidemia    Hypertension    Rectal bleeding    Tobacco abuse     ALLERGIES:  has No Known Allergies.  MEDICATIONS:  Current Outpatient Medications  Medication Sig Dispense Refill   acyclovir (ZOVIRAX) 400 MG tablet Take 1 tablet (400 mg total) by mouth 2 (two) times daily. 60 tablet 3   allopurinol (ZYLOPRIM) 300 MG tablet Take 1 tablet (300 mg total) by mouth daily. 30 tablet 3   aspirin EC 81 MG tablet Take 81 mg by mouth daily. Swallow whole.     cetirizine (ZYRTEC) 10 MG tablet Take 10 mg by mouth daily as needed for allergies.     clonazePAM (KLONOPIN) 0.5 MG tablet Take 1 tablet (0.5 mg total) by mouth 2 (two)  times daily as needed for anxiety. 15 tablet 0   docusate sodium (COLACE) 100 MG capsule Take 1 capsule (100 mg total) by mouth 2 (two) times daily as needed for mild constipation. 30 capsule 3   DULoxetine (CYMBALTA) 20 MG capsule Take 1 capsule (20 mg total) by mouth daily. 30 capsule 1   gabapentin (NEURONTIN) 100 MG capsule Take 1 capsule (100 mg total) by mouth 2 (two) times daily. 60 capsule 1   hydrochlorothiazide (HYDRODIURIL) 25 MG tablet Take 25 mg by mouth daily.     lisinopril (ZESTRIL) 40 MG tablet Take 40 mg by mouth daily.     Methocarbamol 1000 MG TABS Take 1,000 mg by mouth every 6 (six) hours as needed. (Patient taking differently: Take 1,000 mg by mouth every 6 (six) hours as needed (muscle spasms).) 45 tablet 1   morphine (MS CONTIN) 30 MG 12 hr tablet Take 1 tablet by mouth every 8 hours. (Patient taking differently: Take 30 mg by mouth every 8 (eight) hours as needed for pain.) 60 tablet 0   ondansetron (ZOFRAN) 8 MG tablet Take 1 tablet (8 mg total) by mouth every 8 (eight) hours as needed for nausea or vomiting. 60 tablet 3   pantoprazole (PROTONIX) 40 MG tablet Take 1 tablet (40 mg total) by mouth 2 (two) times daily. 60 tablet 2   polyethylene glycol powder (  GLYCOLAX/MIRALAX) 17 GM/SCOOP powder Take 1 capful (17 g) with water by mouth daily. (Patient taking differently: Take 17 g by mouth daily as needed for mild constipation.) 238 g 0   prochlorperazine (COMPAZINE) 10 MG tablet Take 1 tablet (10 mg total) by mouth every 6 (six) hours as needed for nausea or vomiting. 60 tablet 0   REVLIMID 25 MG capsule Take 1 capsule (25 mg total) by mouth daily. Take one capsule daily x 14 days. None for following 7 days 14 capsule 0   rosuvastatin (CRESTOR) 10 MG tablet Take 1 tablet (10 mg total) by mouth daily. 90 tablet 1   No current facility-administered medications for this visit.    VITAL SIGNS: There were no vitals taken for this visit. There were no vitals filed for this  visit.  Estimated body mass index is 33.13 kg/m as calculated from the following:   Height as of 07/17/22: 5\' 9"  (1.753 m).   Weight as of 08/06/22: 224 lb 6 oz (101.8 kg).   PERFORMANCE STATUS (ECOG) : 1 - Symptomatic but completely ambulatory  Assessment NAD, ambulatory RRR Normal breathing pattern AAO x4  IMPRESSION: [name] presents to clinic today for symptom management follow-up.  No acute distress noted.  Patient is ambulatory without assistive devices.  Denies nausea, vomiting, constipation, or diarrhea.  Reports overall he is much appreciative of his current quality of life and how well he is feeling.  Neoplasm related pain Yasuo reports pain continues to be well-controlled.  He has upcoming appointment with his neurologist regarding his spine discomfort.  Plans to restart some physical therapy with a goal of getting back on his treadmill.    We have been able to wean off of scheduled MS Contin. Continues with Gabapentin 100 mg twice daily, Cymbalta 20 mg, and robaxin as needed.  Robaxin as needed for muscle spasms.  Given controlled pain no changes at this time.  Will continue to closely monitor.  Nausea/Indigestion Resolved  Constipation Resolved with current regimen  Overall Deuntae is much improved.  Appreciative of where he is in his health journey.  He knows to contact our office as needed with any concerns.  PLAN:  Successfully weaned off of scheduled MS Contin Oxycodone 20 mg as needed. Not requiring regularly Gabapentin to 100 mg twice daily Cymbalta 20 mg daily  Zofran and Compazine as needed for nausea Robaxin as needed for muscle spasms Miralax daily for constipation I will plan to see patient back in 4-6 weeks in collaboration with other oncology appointments.  Patient knows to contact office sooner if needed.   Patient expressed understanding and was in agreement with this plan. He also understands that He can call the clinic at any time with any  questions, concerns, or complaints.    Any controlled substances utilized were prescribed in the context of palliative care. PDMP has been reviewed.    Time Total: 20 min   Visit consisted of counseling and education dealing with the complex and emotionally intense issues of symptom management and palliative care in the setting of serious and potentially life-threatening illness.Greater than 50%  of this time was spent counseling and coordinating care related to the above assessment and plan.  Alda Lea, AGPCNP-BC  Palliative Medicine Team/ [place]

## 2022-08-12 ENCOUNTER — Other Ambulatory Visit: Payer: Self-pay | Admitting: Nurse Practitioner

## 2022-08-12 DIAGNOSIS — Z515 Encounter for palliative care: Secondary | ICD-10-CM

## 2022-08-12 DIAGNOSIS — G893 Neoplasm related pain (acute) (chronic): Secondary | ICD-10-CM

## 2022-08-12 DIAGNOSIS — C9000 Multiple myeloma not having achieved remission: Secondary | ICD-10-CM

## 2022-08-12 LAB — MULTIPLE MYELOMA PANEL, SERUM
Albumin SerPl Elph-Mcnc: 3.8 g/dL (ref 2.9–4.4)
Albumin/Glob SerPl: 1.3 (ref 0.7–1.7)
Alpha 1: 0.2 g/dL (ref 0.0–0.4)
Alpha2 Glob SerPl Elph-Mcnc: 0.9 g/dL (ref 0.4–1.0)
B-Globulin SerPl Elph-Mcnc: 1 g/dL (ref 0.7–1.3)
Gamma Glob SerPl Elph-Mcnc: 0.9 g/dL (ref 0.4–1.8)
Globulin, Total: 3 g/dL (ref 2.2–3.9)
IgA: 55 mg/dL — ABNORMAL LOW (ref 90–386)
IgG (Immunoglobin G), Serum: 1021 mg/dL (ref 603–1613)
IgM (Immunoglobulin M), Srm: 66 mg/dL (ref 20–172)
M Protein SerPl Elph-Mcnc: 0.5 g/dL — ABNORMAL HIGH
Total Protein ELP: 6.8 g/dL (ref 6.0–8.5)

## 2022-08-13 ENCOUNTER — Inpatient Hospital Stay (HOSPITAL_BASED_OUTPATIENT_CLINIC_OR_DEPARTMENT_OTHER): Payer: BC Managed Care – PPO | Admitting: Nurse Practitioner

## 2022-08-13 ENCOUNTER — Inpatient Hospital Stay (HOSPITAL_BASED_OUTPATIENT_CLINIC_OR_DEPARTMENT_OTHER): Payer: BC Managed Care – PPO | Admitting: Physician Assistant

## 2022-08-13 ENCOUNTER — Inpatient Hospital Stay: Payer: BC Managed Care – PPO

## 2022-08-13 ENCOUNTER — Other Ambulatory Visit: Payer: Self-pay

## 2022-08-13 ENCOUNTER — Encounter: Payer: Self-pay | Admitting: Nurse Practitioner

## 2022-08-13 DIAGNOSIS — F17210 Nicotine dependence, cigarettes, uncomplicated: Secondary | ICD-10-CM | POA: Diagnosis not present

## 2022-08-13 DIAGNOSIS — Z8719 Personal history of other diseases of the digestive system: Secondary | ICD-10-CM | POA: Diagnosis not present

## 2022-08-13 DIAGNOSIS — Z79899 Other long term (current) drug therapy: Secondary | ICD-10-CM | POA: Diagnosis not present

## 2022-08-13 DIAGNOSIS — C9000 Multiple myeloma not having achieved remission: Secondary | ICD-10-CM

## 2022-08-13 DIAGNOSIS — Z800 Family history of malignant neoplasm of digestive organs: Secondary | ICD-10-CM | POA: Diagnosis not present

## 2022-08-13 DIAGNOSIS — E785 Hyperlipidemia, unspecified: Secondary | ICD-10-CM | POA: Diagnosis not present

## 2022-08-13 DIAGNOSIS — I10 Essential (primary) hypertension: Secondary | ICD-10-CM | POA: Diagnosis not present

## 2022-08-13 DIAGNOSIS — K219 Gastro-esophageal reflux disease without esophagitis: Secondary | ICD-10-CM | POA: Diagnosis not present

## 2022-08-13 DIAGNOSIS — Z515 Encounter for palliative care: Secondary | ICD-10-CM

## 2022-08-13 DIAGNOSIS — G2581 Restless legs syndrome: Secondary | ICD-10-CM | POA: Diagnosis not present

## 2022-08-13 DIAGNOSIS — G893 Neoplasm related pain (acute) (chronic): Secondary | ICD-10-CM | POA: Diagnosis not present

## 2022-08-13 DIAGNOSIS — Z7961 Long term (current) use of immunomodulator: Secondary | ICD-10-CM | POA: Diagnosis not present

## 2022-08-13 DIAGNOSIS — K5900 Constipation, unspecified: Secondary | ICD-10-CM | POA: Diagnosis not present

## 2022-08-13 DIAGNOSIS — Z86010 Personal history of colonic polyps: Secondary | ICD-10-CM | POA: Diagnosis not present

## 2022-08-13 DIAGNOSIS — K449 Diaphragmatic hernia without obstruction or gangrene: Secondary | ICD-10-CM | POA: Diagnosis not present

## 2022-08-13 DIAGNOSIS — Z79624 Long term (current) use of inhibitors of nucleotide synthesis: Secondary | ICD-10-CM | POA: Diagnosis not present

## 2022-08-13 DIAGNOSIS — Z5112 Encounter for antineoplastic immunotherapy: Secondary | ICD-10-CM | POA: Diagnosis not present

## 2022-08-13 DIAGNOSIS — Z7982 Long term (current) use of aspirin: Secondary | ICD-10-CM | POA: Diagnosis not present

## 2022-08-13 LAB — CMP (CANCER CENTER ONLY)
ALT: 15 U/L (ref 0–44)
AST: 13 U/L — ABNORMAL LOW (ref 15–41)
Albumin: 4.1 g/dL (ref 3.5–5.0)
Alkaline Phosphatase: 61 U/L (ref 38–126)
Anion gap: 5 (ref 5–15)
BUN: 14 mg/dL (ref 6–20)
CO2: 28 mmol/L (ref 22–32)
Calcium: 9.4 mg/dL (ref 8.9–10.3)
Chloride: 101 mmol/L (ref 98–111)
Creatinine: 0.86 mg/dL (ref 0.61–1.24)
GFR, Estimated: >60 mL/min (ref >60)
Glucose, Bld: 111 mg/dL — ABNORMAL HIGH (ref 70–99)
Potassium: 4 mmol/L (ref 3.5–5.1)
Sodium: 134 mmol/L — ABNORMAL LOW (ref 135–145)
Total Bilirubin: 0.5 mg/dL (ref 0.3–1.2)
Total Protein: 6.9 g/dL (ref 6.5–8.1)

## 2022-08-13 LAB — CBC WITH DIFFERENTIAL (CANCER CENTER ONLY)
Abs Immature Granulocytes: 0.01 10*3/uL (ref 0.00–0.07)
Basophils Absolute: 0 10*3/uL (ref 0.0–0.1)
Basophils Relative: 0 %
Eosinophils Absolute: 0 10*3/uL (ref 0.0–0.5)
Eosinophils Relative: 0 %
HCT: 37.5 % — ABNORMAL LOW (ref 39.0–52.0)
Hemoglobin: 12.4 g/dL — ABNORMAL LOW (ref 13.0–17.0)
Immature Granulocytes: 0 %
Lymphocytes Relative: 29 %
Lymphs Abs: 1.7 10*3/uL (ref 0.7–4.0)
MCH: 28.8 pg (ref 26.0–34.0)
MCHC: 33.1 g/dL (ref 30.0–36.0)
MCV: 87 fL (ref 80.0–100.0)
Monocytes Absolute: 0.6 10*3/uL (ref 0.1–1.0)
Monocytes Relative: 9 %
Neutro Abs: 3.7 10*3/uL (ref 1.7–7.7)
Neutrophils Relative %: 62 %
Platelet Count: 186 10*3/uL (ref 150–400)
RBC: 4.31 MIL/uL (ref 4.22–5.81)
RDW: 14.4 % (ref 11.5–15.5)
WBC Count: 6.1 10*3/uL (ref 4.0–10.5)
nRBC: 0 % (ref 0.0–0.2)

## 2022-08-13 MED ORDER — DENOSUMAB 120 MG/1.7ML ~~LOC~~ SOLN
120.0000 mg | Freq: Once | SUBCUTANEOUS | Status: AC
  Administered 2022-08-13: 120 mg via SUBCUTANEOUS
  Filled 2022-08-13: qty 1.7

## 2022-08-13 MED ORDER — BORTEZOMIB CHEMO SQ INJECTION 3.5 MG (2.5MG/ML)
1.3000 mg/m2 | Freq: Once | INTRAMUSCULAR | Status: AC
  Administered 2022-08-13: 3 mg via SUBCUTANEOUS
  Filled 2022-08-13: qty 1.2

## 2022-08-13 MED ORDER — DEXAMETHASONE 4 MG PO TABS
40.0000 mg | ORAL_TABLET | Freq: Once | ORAL | Status: AC
  Administered 2022-08-13: 40 mg via ORAL
  Filled 2022-08-13: qty 10

## 2022-08-13 NOTE — Patient Instructions (Signed)
[facility]  Discharge Instructions: Thank you for choosing [facility] to provide your oncology and hematology care.   If you have a lab appointment with the Cancer Center, please go directly to the Cancer Center and check in at the registration area.   Wear comfortable clothing and clothing appropriate for easy access to any Portacath or PICC line.   We strive to give you quality time with your provider. You may need to reschedule your appointment if you arrive late (15 or more minutes).  Arriving late affects you and other patients whose appointments are after yours.  Also, if you miss three or more appointments without notifying the office, you may be dismissed from the clinic at the provider's discretion.      For prescription refill requests, have your pharmacy contact our office and allow 72 hours for refills to be completed.    Today you received the following chemotherapy and/or immunotherapy agents: Velcade     To help prevent nausea and vomiting after your treatment, we encourage you to take your nausea medication as directed.  BELOW ARE SYMPTOMS THAT SHOULD BE REPORTED IMMEDIATELY: *FEVER GREATER THAN 100.4 F (38 C) OR HIGHER *CHILLS OR SWEATING *NAUSEA AND VOMITING THAT IS NOT CONTROLLED WITH YOUR NAUSEA MEDICATION *UNUSUAL SHORTNESS OF BREATH *UNUSUAL BRUISING OR BLEEDING *URINARY PROBLEMS (pain or burning when urinating, or frequent urination) *BOWEL PROBLEMS (unusual diarrhea, constipation, pain near the anus) TENDERNESS IN MOUTH AND THROAT WITH OR WITHOUT PRESENCE OF ULCERS (sore throat, sores in mouth, or a toothache) UNUSUAL RASH, SWELLING OR PAIN  UNUSUAL VAGINAL DISCHARGE OR ITCHING   Items with * indicate a potential emergency and should be followed up as soon as possible or go to the Emergency Department if any problems should occur.  Please show the CHEMOTHERAPY ALERT CARD or IMMUNOTHERAPY ALERT CARD at check-in  to the Emergency Department and triage nurse.  Should you have questions after your visit or need to cancel or reschedule your appointment, please contact [facility]  Dept: [phone]  and follow the prompts.  Office hours are 8:00 a.m. to 4:30 p.m. Monday - Friday. Please note that voicemails left after 4:00 p.m. may not be returned until the following business day.  We are closed weekends and major holidays. You have access to a nurse at all times for urgent questions. Please call the main number to the clinic Dept: [phone] and follow the prompts.   For any non-urgent questions, you may also contact your provider using MyChart. We now offer e-Visits for anyone 18 and older to request care online for non-urgent symptoms. For details visit [email or web address].   Also download the MyChart app! Go to the app store, search "MyChart", open the app, select , and log in with your MyChart username and password.   

## 2022-08-13 NOTE — Progress Notes (Signed)
[name]   [phone]  PROGRESS NOTE  Patient Care Team: [name], MD as PCP - General (Family Medicine) Lorretta Harp, MD as PCP - Cardiology (Cardiology) Gala Romney Cristopher Estimable, MD as Attending Physician (Gastroenterology)  Hematological/Oncological History # IgG Kappa Multiple Myeloma, 1p32/1q21.  01/04/2022: MRI lumbar spine shows diffuse heterogeneous marrow signal with heterogeneous enhancement throughout the thoracolumbar spine.  Additionally, there is subacute-chronic L3 vertebral body compression fraction. 01/14/2022: establish care with Dr. Lorenso [name]  02/10/2022: bone marrow biopsy shows range of plasma cell involvement from 50% on the biopsy to 60% on aspirate smear slides and close to 90% on the clot section for an overall percentage estimated at approximately 80% overall. 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  02/26/2022: Cycle 1 of VRD therapy 04/01/2022: Cycle 2 of VRD therapy  04/23/2022: Cycle 3 of VRD therapy  05/14/2022: Cycle 4 of VRD therapy  06/04/2022: Underwent kyphoplasty so treatment was cancelled 06/11/2022: Resume Cycle 5 Day 8 of VRD therapy 06/25/2022: Cycle 6 Day 1 of VRD 07/16/2022: Cycle 7 Day 1 of VRD 08/06/2022: Cycle 8 Day 1 of VRD  Interval History:  [name] 52 y.o. male with medical history significant for newly diagnosed IgG kappa multiple myeloma who presents for a follow up visit. The patient's last visit was on 07/29/2022 at which time he continued on VRD therapy.    On exam today [name] reports that he is doing well and continues to tolerate treatment. He is planning to resume some exercising with walking. He has a good appetite and gained 3 lbs since the last visit. He denies nausea, vomiting or abdominal pain. He denies any bowel habit changes without recurrent episodes of diarrhea or constipation. He denies easy bruising or signs of active  bleeding.  He denies fevers, chills, sweats, shortness of breath, chest pain or cough. He has no other complaints. A full 10 point ROS was otherwise negative.  MEDICAL HISTORY:  Past Medical History:  Diagnosis Date   Allergy    Anemia    Atypical chest pain    Cancer (HCC)    Multiple myeloma with normocytic anemia   GERD (gastroesophageal reflux disease)    Hepatic cyst    Benign by MRI   Hiatal hernia    History of colonic polyps    Hyperlipidemia    Hypertension    Rectal bleeding    Tobacco abuse     SURGICAL HISTORY: Past Surgical History:  Procedure Laterality Date   COLONOSCOPY  01/10/2009     RMR: Normal rectum, normal colon; repeat in 2015 due to Barre of colon cancer   COLONOSCOPY N/A 01/09/2014   DGL:OVFIEPPI colonic polyps-removed as described   COLONOSCOPY N/A 09/18/2016   Procedure: COLONOSCOPY;  Surgeon: Daneil Dolin, MD;  [facility];  Service: Endoscopy;  Laterality: N/A;  730    ESOPHAGOGASTRODUODENOSCOPY  10/04/2007   RMR: Distal esophageal erosions consistent with erosive reflux esophagitis, patulous gastroesophageal junction status post passage of a  Maloney dilator, 24 [place].  Otherwise, unremarkable esophagus.  Hiatal hernia.  Otherwise normal stomach.  Bulbar erosion   ESOPHAGOGASTRODUODENOSCOPY N/A 01/09/2014   Erosive reflux esophagitis. Small hiatal hernia   HUMERUS IM NAIL Left 02/22/2022   Procedure: INTRAMEDULLARY (IM) NAIL HUMERAL;  Surgeon: Nicholes Stairs, MD;  Location: [facility];  Service: Orthopedics;  Laterality: Left;   I & D EXTREMITY Left 07/26/2018   Procedure: IRRIGATION AND DEBRIDEMENT EXTREMITY;  Surgeon:  Dayna Barker, MD;  Location: [facility];  Service: Plastics;  Laterality: Left;   IR BONE TUMOR(S)RF ABLATION  02/05/2022   IR BONE TUMOR(S)RF ABLATION  02/24/2022   IR KYPHO EA ADDL LEVEL THORACIC OR LUMBAR  02/19/2022   IR KYPHO LUMBAR INC FX REDUCE BONE BX UNI/BIL CANNULATION INC/IMAGING  02/05/2022   IR KYPHO LUMBAR INC FX  REDUCE BONE BX UNI/BIL CANNULATION INC/IMAGING  02/19/2022   PERCUTANEOUS PINNING Left 07/26/2018   Procedure: PERCUTANEOUS PINNING EXTREMITY;  Surgeon: Dayna Barker, MD;  Location: [facility];  Service: Plastics;  Laterality: Left;    SOCIAL HISTORY: Social History   Socioeconomic History   Marital status: Divorced    Spouse name: Not on file   Number of children: 2   Years of education: Not on file   Highest education level: Not on file  Occupational History   Occupation: Scientist, 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   Smoking status: Former    Packs/day: 0.25    Years: 10.00    Additional pack years: 0.00    Total pack years: 2.50    Types: Cigarettes    Quit date: 12/24/2021    Years since quitting: 0.6   Smokeless tobacco: Never  Vaping Use   Vaping Use: Never used  Substance and Sexual Activity   Alcohol use: Yes    Alcohol/week: 0.0 standard drinks of alcohol    Comment: occasional/wine on the weekends   Drug use: No   Sexual activity: Not on file  Other Topics Concern   Not on file  Social History Narrative   Right handed   Lives alone one story home   Social Determinants of Health   Financial Resource Strain: Not on file  Food Insecurity: No Food Insecurity (07/17/2022)   Hunger Vital Sign    Worried About Running Out of Food in the Last Year: Never true    Ran Out of Food in the Last Year: Never true  Transportation Needs: No Transportation Needs (07/17/2022)   PRAPARE - Transportation    Lack of Transportation (Medical): No    Lack of Transportation (Non-Medical): No  Physical Activity: Not on file  Stress: Not on file  Social Connections: Not on file  Intimate Partner Violence: Not At Risk (07/17/2022)   Humiliation, Afraid, Rape, and Kick questionnaire    Fear of Current or Ex-Partner: No    Emotionally Abused: No    Physically Abused: No    Sexually Abused: No    FAMILY HISTORY: Family History   Problem Relation Age of Onset   Heart disease Mother    Hypertension Mother    Diabetes Father    Hypertension Father    Colon cancer Sister        passed away from colon cancer, in her 30s   Heart attack Maternal Uncle    Prostate cancer Maternal Uncle    Diabetes Other    Pancreatic cancer Neg Hx    Rectal cancer Neg Hx    Esophageal cancer Neg Hx    Stomach cancer Neg Hx     ALLERGIES:  has No Known Allergies.  MEDICATIONS:  Current Outpatient Medications  Medication Sig Dispense Refill   acyclovir (ZOVIRAX) 400 MG tablet Take 1 tablet (400 mg total) by mouth 2 (two) times daily. 60 tablet 3   allopurinol (ZYLOPRIM) 300 MG tablet Take 1 tablet (300 mg total) by mouth daily. 30 tablet 3  aspirin EC 81 MG tablet Take 81 mg by mouth daily. Swallow whole.     cetirizine (ZYRTEC) 10 MG tablet Take 10 mg by mouth daily as needed for allergies.     clonazePAM (KLONOPIN) 0.5 MG tablet Take 1 tablet (0.5 mg total) by mouth 2 (two) times daily as needed for anxiety. 15 tablet 0   docusate sodium (COLACE) 100 MG capsule Take 1 capsule (100 mg total) by mouth 2 (two) times daily as needed for mild constipation. 30 capsule 3   DULoxetine (CYMBALTA) 20 MG capsule Take 1 capsule (20 mg total) by mouth daily. 30 capsule 1   gabapentin (NEURONTIN) 100 MG capsule Take 1 capsule by mouth twice daily 60 capsule 2   hydrochlorothiazide (HYDRODIURIL) 25 MG tablet Take 25 mg by mouth daily.     lisinopril (ZESTRIL) 40 MG tablet Take 40 mg by mouth daily.     Methocarbamol 1000 MG TABS Take 1,000 mg by mouth every 6 (six) hours as needed. (Patient taking differently: Take 1,000 mg by mouth every 6 (six) hours as needed (muscle spasms).) 45 tablet 1   morphine (MS CONTIN) 30 MG 12 hr tablet Take 1 tablet by mouth every 8 hours. (Patient taking differently: Take 30 mg by mouth every 8 (eight) hours as needed for pain.) 60 tablet 0   ondansetron (ZOFRAN) 8 MG tablet Take 1 tablet (8 mg total) by mouth  every 8 (eight) hours as needed for nausea or vomiting. 60 tablet 3   pantoprazole (PROTONIX) 40 MG tablet Take 1 tablet (40 mg total) by mouth 2 (two) times daily. 60 tablet 2   polyethylene glycol powder (GLYCOLAX/MIRALAX) 17 GM/SCOOP powder Take 1 capful (17 g) with water by mouth daily. (Patient taking differently: Take 17 g by mouth daily as needed for mild constipation.) 238 g 0   prochlorperazine (COMPAZINE) 10 MG tablet Take 1 tablet (10 mg total) by mouth every 6 (six) hours as needed for nausea or vomiting. 60 tablet 0   REVLIMID 25 MG capsule Take 1 capsule (25 mg total) by mouth daily. Take one capsule daily x 14 days. None for following 7 days 14 capsule 0   rosuvastatin (CRESTOR) 10 MG tablet Take 1 tablet (10 mg total) by mouth daily. 90 tablet 1   No current facility-administered medications for this visit.    REVIEW OF SYSTEMS:   Constitutional: ( - ) fevers, ( - )  chills , ( - ) night sweats Eyes: ( - ) blurriness of vision, ( - ) double vision, ( - ) watery eyes Ears, nose, mouth, throat, and face: ( - ) mucositis, ( - ) sore throat Respiratory: ( - ) cough, ( - ) dyspnea, ( - ) wheezes Cardiovascular: ( - ) palpitation, ( - ) chest discomfort, ( - ) lower extremity swelling Gastrointestinal:  ( - ) nausea, ( - ) heartburn, ( - ) change in bowel habits Skin: ( - ) abnormal skin rashes Lymphatics: ( - ) new lymphadenopathy, ( - ) easy bruising Neurological: ( - ) numbness, ( - ) tingling, ( - ) new weaknesses Behavioral/Psych: ( - ) mood change, ( - ) new changes  All other systems were reviewed with the patient and are negative.  PHYSICAL EXAMINATION: ECOG PERFORMANCE STATUS: 1 - Symptomatic but completely ambulatory  Vitals:   08/13/22 1326  BP: 131/85  Pulse: 93  Resp: 16  Temp: 97.6 F (36.4 C)  SpO2: 99%   Filed Weights   08/13/22  1326  Weight: 228 lb 12.8 oz (103.8 kg)    GENERAL: Well-appearing young African-American male, alert, no distress and  comfortable SKIN: skin color, texture, turgor are normal, no rashes or significant lesions EYES: conjunctiva are pink and non-injected, sclera clear LUNGS: clear to auscultation and percussion with normal breathing effort HEART: regular rate & rhythm and no murmurs and no lower extremity edema Musculoskeletal: no cyanosis of digits and no clubbing  PSYCH: alert & oriented x 3, fluent speech NEURO: no focal motor/sensory deficits  LABORATORY DATA:  I have reviewed the data as listed    Latest Ref Rng & Units 08/13/2022   12:16 PM 08/06/2022    1:44 PM 07/29/2022   12:32 PM  CBC  WBC 4.0 - 10.5 K/uL 6.1  6.9  5.9   Hemoglobin 13.0 - 17.0 g/dL 12.4  13.8  12.4   Hematocrit 39.0 - 52.0 % 37.5  42.0  37.2   Platelets 150 - 400 K/uL 186  241  229        Latest Ref Rng & Units 08/13/2022   12:16 PM 08/06/2022    1:44 PM 07/29/2022   12:32 PM  CMP  Glucose 70 - 99 mg/dL 111  127  121   BUN 6 - 20 mg/dL 14  12  10    Creatinine 0.61 - 1.24 mg/dL 0.86  1.00  0.92   Sodium 135 - 145 mmol/L 134  133  132   Potassium 3.5 - 5.1 mmol/L 4.0  3.7  4.4   Chloride 98 - 111 mmol/L 101  100  101   CO2 22 - 32 mmol/L 28  26  25    Calcium 8.9 - 10.3 mg/dL 9.4  8.9  9.2   Total Protein 6.5 - 8.1 g/dL 6.9  7.4  7.4   Total Bilirubin 0.3 - 1.2 mg/dL 0.5  0.6  0.5   Alkaline Phos 38 - 126 U/L 61  65  70   AST 15 - 41 U/L 13  13  12    ALT 0 - 44 U/L 15  17  14      Lab Results  Component Value Date   MPROTEIN 0.5 (H) 08/06/2022   MPROTEIN 0.5 (H) 07/16/2022   MPROTEIN 0.8 (H) 06/18/2022   Lab Results  Component Value Date   KPAFRELGTCHN 17.5 08/06/2022   KPAFRELGTCHN 14.0 07/16/2022   KPAFRELGTCHN 24.0 (H) 06/25/2022   LAMBDASER 9.1 08/06/2022   LAMBDASER 9.1 07/16/2022   LAMBDASER 9.9 06/25/2022   KAPLAMBRATIO 1.92 (H) 08/06/2022   KAPLAMBRATIO 1.54 07/16/2022   KAPLAMBRATIO 2.42 (H) 06/25/2022   RADIOGRAPHIC STUDIES: CT ABDOMEN PELVIS W CONTRAST  Result Date: 07/17/2022 CLINICAL DATA:   Generalized abdominal pain. Low back pain. History of multiple myeloma with multilevel thoracic and lumbar kyphoplasty . EXAM: CT ABDOMEN AND PELVIS WITH CONTRAST TECHNIQUE: Multidetector CT imaging of the abdomen and pelvis was performed using the standard protocol following bolus administration of intravenous contrast. RADIATION DOSE REDUCTION: This exam was performed according to the departmental dose-optimization program which includes automated exposure control, adjustment of the mA and/or kV according to patient size and/or use of iterative reconstruction technique. CONTRAST:  163mL OMNIPAQUE IOHEXOL 300 MG/ML  SOLN COMPARISON:  Similar study of 07/21/2021. FINDINGS: Lower chest: Minimal posterior atelectasis. Lung bases are otherwise clear. The cardiac size is normal. Hepatobiliary: Stable 1.6 cm cyst in segment 2 of the left lobe. The liver is 19 cm length mildly steatotic without mass enhancement. Gallbladder is contracted but grossly normal.  No biliary dilatation. The hepatic portal main vein again measures prominent at 17 mm. Pancreas: No abnormality. Spleen: No abnormality.  No splenomegaly. Adrenals/Urinary Tract: Adrenal glands are unremarkable. Kidneys are normal, without renal calculi, focal lesion, or hydronephrosis. Bladder is unremarkable. Stomach/Bowel: There is moderate chronic fold thickening in the stomach. Probable chronic gastritis. Endoscopy may be indicated to exclude underlying ulcerative component but this is grossly unchanged. No small bowel dilatation or wall thickening is seen. The appendix is increasingly prominent in caliber measuring 11 mm concerning for appendicitis with portions showing trace adjacent stranding. No appendiceal rupture or abscess is seen. There is moderate stool retention. There are colonic diverticula without evidence for diverticulitis. Vascular/Lymphatic: Anterior to L2 there is a linear radiopacity extending from the anterior vertebral body over to the right  and into the lumen of the IVC. This is probably venously intravasated kyphoplasty cement as there appeared to be a cortical vein from L2 in this location emptying into the IVC previously. No other significant vascular findings are seen. No enlarged abdominal, pelvic or inguinal lymph nodes. Reproductive: Prostate is unremarkable. Other: No abdominal wall hernia or abnormality. No abdominopelvic ascites. Musculoskeletal: There is a marked interval change in the bones compared to the prior study. There is new demonstration of diffuse demineralization and heterogeneity in the lower ribs, visualized spine, pelvis and sacrum with innumerable lucent lesions and with vertebral body wedge compression fractures now seen from T10 through L4, moderate at T10, T12, L3 and L4 and relatively milder L1 and L2, with preservation of the normal vertebral height of L5. Kyphoplasty cement has been injected at T10, T 12, L1, L2, L3 and 4. Age of the compression fractures is not known but none of this was present 1 year ago. IMPRESSION: 1. The appendix is increasingly prominent in caliber measuring up to 11 mm with portions showing trace adjacent stranding. Findings concerning for acute appendicitis. No appendiceal rupture or abscess is seen. This result was phoned to Dr. Randal Buba at 5:07 a.m., 07/17/2022. 2. Constipation and diverticulosis. 3. Interval demonstration of diffuse demineralization and heterogeneity in the lower ribs, visualized spine, pelvis and sacrum with innumerable lucent lesions and multiple vertebral body wedge compression fractures. Age of the fractures is not known but none of this was present 1 year ago. Multilevel kyphoplasty. 4. Linear radiopacity extending from the anterior L2 vertebral body into the lumen of the IVC. This is probably venously intravasated kyphoplasty cement. 5. Mildly prominent liver with mild steatosis and stable 1.6 cm cyst in segment 2 of the left lobe. 6. Chronic gastritis. Endoscopy may be  indicated to exclude underlying ulcerative component but this is grossly unchanged. Electronically Signed   By: Telford Nab M.D.   On: 07/17/2022 05:16    ASSESSMENT & PLAN ADHAM BORREGO 52 y.o. male with medical history significant for IgG kappa multiple myeloma who presents for a follow up visit.  After review of the labs, review the records, discussion with the patient the findings are most consistent with newly diagnosed multiple myeloma.  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  At this time would recommend proceeding with VRD chemotherapy with the intention of referral for ASCT when his labs are appropriate.  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 -- Recommend VRD chemotherapy with intention of proceeding to transplant. --  Labs at each visit to include CBC, CMP, LDH with monthly restaging labs SPEP and serum free light chains -- Started VRD therapy on 02/26/2022.  PLAN: --Presents today to start Cycle 8 , Day 8 of VRD therapy --Labs from today were reviewed and adequate for treatment. WBC 6.1, hemoglobin 124, MCV 87.0, and platelets 186 --Most recent myeloma labs from 08/06/2022 shows M protein measuring 0.5 g/dL and normalized serum free light chains.  --Proceed with treatment today as planned without any dose modifications --Continue with weekly treatments and follow up in clinic in 2 weeks.   # Leg Discomfort/Restless Leg -- Patient provided with Requip by the emergency department, discontinued due to poor efficacy. --Evaluated by Dr. Mickeal Skinner on 07/28/2022. Recommended to short course of steroids so started prednisone 50 mg daily x 7 days today. Discussed etiologies including iron deficiency so iron levels were checked and was normal.   #Constipation: --Recommend to continue on  colace but add miralax as well.   #Pathologic fractures-secondary to MM: --Involving L1, L2 and L3 compression fracture and left humerus fracture.  --Underwent kyphoplasty of L3 fracture on 02/19/2022 --Underwent medullary nailing of left humeral shaft on 02/22/2022 --Underwent kyphoplasty on 06/04/2022.   #Pain Medication -- Per palliative care patient has weaned off MS contin. Currently conitnues on gabapentin 100 mg BID, cymbalta 20 mg and robaxin as needed.  --Under the care of palliative care team.   #Supportive Care -- chemotherapy education complete -- port placement not required -- zofran 8mg  q8H PRN and compazine 10mg  PO q6H for nausea -- acyclovir 400mg  PO BID for VCZ prophylaxis -- allopurinol 300mg  PO daily for TLS prophylaxis -- Dental clearance for Zometa/Denosumab approved. Last dose of denosumab 07/16/2022. Continue monthly.   No orders of the defined types were placed in this encounter.   All questions were answered. The patient knows to call the clinic with any problems, questions or concerns.  I have spent a total of 30 minutes minutes of face-to-face and non-face-to-face time, preparing to see the [place] a medically appropriate examination, counseling and educating the patient, ordering medications/tests, documenting clinical information in the electronic health record,  and care coordination.   Dede Query PA-C Dept of Hematology and [place] at [facility] Phone: [phone]   08/13/2022 9:37 PM

## 2022-08-14 DIAGNOSIS — S22070A Wedge compression fracture of T9-T10 vertebra, initial encounter for closed fracture: Secondary | ICD-10-CM | POA: Diagnosis not present

## 2022-08-14 DIAGNOSIS — M6281 Muscle weakness (generalized): Secondary | ICD-10-CM | POA: Diagnosis not present

## 2022-08-15 ENCOUNTER — Telehealth: Payer: Self-pay | Admitting: Hematology and Oncology

## 2022-08-15 NOTE — Telephone Encounter (Signed)
Called patient per 3/22 secure chat to reschedule 3/27 appointments. Left voicemail with new appointment information and contact details if needing to reschedule.

## 2022-08-19 ENCOUNTER — Other Ambulatory Visit: Payer: Self-pay | Admitting: Nurse Practitioner

## 2022-08-19 DIAGNOSIS — C9000 Multiple myeloma not having achieved remission: Secondary | ICD-10-CM

## 2022-08-19 DIAGNOSIS — K30 Functional dyspepsia: Secondary | ICD-10-CM

## 2022-08-19 DIAGNOSIS — Z515 Encounter for palliative care: Secondary | ICD-10-CM

## 2022-08-20 ENCOUNTER — Inpatient Hospital Stay: Payer: BC Managed Care – PPO

## 2022-08-20 ENCOUNTER — Other Ambulatory Visit: Payer: Self-pay

## 2022-08-20 VITALS — BP 134/86 | HR 90 | Temp 98.0°F | Resp 16 | Wt 233.0 lb

## 2022-08-20 DIAGNOSIS — Z800 Family history of malignant neoplasm of digestive organs: Secondary | ICD-10-CM | POA: Diagnosis not present

## 2022-08-20 DIAGNOSIS — Z7961 Long term (current) use of immunomodulator: Secondary | ICD-10-CM | POA: Diagnosis not present

## 2022-08-20 DIAGNOSIS — K219 Gastro-esophageal reflux disease without esophagitis: Secondary | ICD-10-CM | POA: Diagnosis not present

## 2022-08-20 DIAGNOSIS — Z79624 Long term (current) use of inhibitors of nucleotide synthesis: Secondary | ICD-10-CM | POA: Diagnosis not present

## 2022-08-20 DIAGNOSIS — Z5112 Encounter for antineoplastic immunotherapy: Secondary | ICD-10-CM | POA: Diagnosis not present

## 2022-08-20 DIAGNOSIS — E785 Hyperlipidemia, unspecified: Secondary | ICD-10-CM | POA: Diagnosis not present

## 2022-08-20 DIAGNOSIS — K5900 Constipation, unspecified: Secondary | ICD-10-CM | POA: Diagnosis not present

## 2022-08-20 DIAGNOSIS — Z7982 Long term (current) use of aspirin: Secondary | ICD-10-CM | POA: Diagnosis not present

## 2022-08-20 DIAGNOSIS — C9000 Multiple myeloma not having achieved remission: Secondary | ICD-10-CM

## 2022-08-20 DIAGNOSIS — I10 Essential (primary) hypertension: Secondary | ICD-10-CM | POA: Diagnosis not present

## 2022-08-20 DIAGNOSIS — Z8719 Personal history of other diseases of the digestive system: Secondary | ICD-10-CM | POA: Diagnosis not present

## 2022-08-20 DIAGNOSIS — Z79899 Other long term (current) drug therapy: Secondary | ICD-10-CM | POA: Diagnosis not present

## 2022-08-20 DIAGNOSIS — F17210 Nicotine dependence, cigarettes, uncomplicated: Secondary | ICD-10-CM | POA: Diagnosis not present

## 2022-08-20 DIAGNOSIS — G2581 Restless legs syndrome: Secondary | ICD-10-CM | POA: Diagnosis not present

## 2022-08-20 DIAGNOSIS — Z86010 Personal history of colonic polyps: Secondary | ICD-10-CM | POA: Diagnosis not present

## 2022-08-20 DIAGNOSIS — K449 Diaphragmatic hernia without obstruction or gangrene: Secondary | ICD-10-CM | POA: Diagnosis not present

## 2022-08-20 LAB — CBC WITH DIFFERENTIAL (CANCER CENTER ONLY)
Abs Immature Granulocytes: 0.16 10*3/uL — ABNORMAL HIGH (ref 0.00–0.07)
Basophils Absolute: 0 10*3/uL (ref 0.0–0.1)
Basophils Relative: 0 %
Eosinophils Absolute: 0 10*3/uL (ref 0.0–0.5)
Eosinophils Relative: 1 %
HCT: 37 % — ABNORMAL LOW (ref 39.0–52.0)
Hemoglobin: 11.9 g/dL — ABNORMAL LOW (ref 13.0–17.0)
Immature Granulocytes: 3 %
Lymphocytes Relative: 27 %
Lymphs Abs: 1.4 10*3/uL (ref 0.7–4.0)
MCH: 28.2 pg (ref 26.0–34.0)
MCHC: 32.2 g/dL (ref 30.0–36.0)
MCV: 87.7 fL (ref 80.0–100.0)
Monocytes Absolute: 0.3 10*3/uL (ref 0.1–1.0)
Monocytes Relative: 5 %
Neutro Abs: 3.3 10*3/uL (ref 1.7–7.7)
Neutrophils Relative %: 64 %
Platelet Count: 193 10*3/uL (ref 150–400)
RBC: 4.22 MIL/uL (ref 4.22–5.81)
RDW: 14.5 % (ref 11.5–15.5)
WBC Count: 5.2 10*3/uL (ref 4.0–10.5)
nRBC: 0 % (ref 0.0–0.2)

## 2022-08-20 LAB — CMP (CANCER CENTER ONLY)
ALT: 17 U/L (ref 0–44)
AST: 14 U/L — ABNORMAL LOW (ref 15–41)
Albumin: 4 g/dL (ref 3.5–5.0)
Alkaline Phosphatase: 52 U/L (ref 38–126)
Anion gap: 8 (ref 5–15)
BUN: 13 mg/dL (ref 6–20)
CO2: 27 mmol/L (ref 22–32)
Calcium: 8.8 mg/dL — ABNORMAL LOW (ref 8.9–10.3)
Chloride: 100 mmol/L (ref 98–111)
Creatinine: 0.98 mg/dL (ref 0.61–1.24)
GFR, Estimated: >60 mL/min (ref >60)
Glucose, Bld: 116 mg/dL — ABNORMAL HIGH (ref 70–99)
Potassium: 4 mmol/L (ref 3.5–5.1)
Sodium: 135 mmol/L (ref 135–145)
Total Bilirubin: 0.5 mg/dL (ref 0.3–1.2)
Total Protein: 6.9 g/dL (ref 6.5–8.1)

## 2022-08-20 MED ORDER — BORTEZOMIB CHEMO SQ INJECTION 3.5 MG (2.5MG/ML)
1.3000 mg/m2 | Freq: Once | INTRAMUSCULAR | Status: AC
  Administered 2022-08-20: 3 mg via SUBCUTANEOUS
  Filled 2022-08-20: qty 1.2

## 2022-08-20 MED ORDER — DEXAMETHASONE 4 MG PO TABS
40.0000 mg | ORAL_TABLET | Freq: Once | ORAL | Status: AC
  Administered 2022-08-20: 40 mg via ORAL
  Filled 2022-08-20: qty 10

## 2022-08-20 NOTE — Patient Instructions (Signed)
[facility]  Discharge Instructions: Thank you for choosing [facility] to provide your oncology and hematology care.   If you have a lab appointment with the Cancer Center, please go directly to the Cancer Center and check in at the registration area.   Wear comfortable clothing and clothing appropriate for easy access to any Portacath or PICC line.   We strive to give you quality time with your provider. You may need to reschedule your appointment if you arrive late (15 or more minutes).  Arriving late affects you and other patients whose appointments are after yours.  Also, if you miss three or more appointments without notifying the office, you may be dismissed from the clinic at the provider's discretion.      For prescription refill requests, have your pharmacy contact our office and allow 72 hours for refills to be completed.    Today you received the following chemotherapy and/or immunotherapy agents: bortezomib      To help prevent nausea and vomiting after your treatment, we encourage you to take your nausea medication as directed.  BELOW ARE SYMPTOMS THAT SHOULD BE REPORTED IMMEDIATELY: *FEVER GREATER THAN 100.4 F (38 C) OR HIGHER *CHILLS OR SWEATING *NAUSEA AND VOMITING THAT IS NOT CONTROLLED WITH YOUR NAUSEA MEDICATION *UNUSUAL SHORTNESS OF BREATH *UNUSUAL BRUISING OR BLEEDING *URINARY PROBLEMS (pain or burning when urinating, or frequent urination) *BOWEL PROBLEMS (unusual diarrhea, constipation, pain near the anus) TENDERNESS IN MOUTH AND THROAT WITH OR WITHOUT PRESENCE OF ULCERS (sore throat, sores in mouth, or a toothache) UNUSUAL RASH, SWELLING OR PAIN  UNUSUAL VAGINAL DISCHARGE OR ITCHING   Items with * indicate a potential emergency and should be followed up as soon as possible or go to the Emergency Department if any problems should occur.  Please show the CHEMOTHERAPY ALERT CARD or IMMUNOTHERAPY ALERT CARD at  check-in to the Emergency Department and triage nurse.  Should you have questions after your visit or need to cancel or reschedule your appointment, please contact [facility]  Dept: [phone]  and follow the prompts.  Office hours are 8:00 a.m. to 4:30 p.m. Monday - Friday. Please note that voicemails left after 4:00 p.m. may not be returned until the following business day.  We are closed weekends and major holidays. You have access to a nurse at all times for urgent questions. Please call the main number to the clinic Dept: [phone] and follow the prompts.   For any non-urgent questions, you may also contact your provider using MyChart. We now offer e-Visits for anyone 18 and older to request care online for non-urgent symptoms. For details visit [email or web address].   Also download the MyChart app! Go to the app store, search "MyChart", open the app, select [facility], and log in with your MyChart username and password.   

## 2022-08-25 ENCOUNTER — Encounter: Payer: Self-pay | Admitting: *Deleted

## 2022-08-25 NOTE — Progress Notes (Signed)
[place] [phone]   [phone]  PROGRESS NOTE  Patient Care Team: Christopher Mai, MD as PCP - General (Family Medicine) Christopher Harp, MD as PCP - Cardiology (Cardiology) Christopher Romney Cristopher Estimable, MD as Attending Physician (Gastroenterology)  Hematological/Oncological History # IgG Kappa Multiple Myeloma, 1p32/1q21.  01/04/2022: MRI lumbar spine shows diffuse heterogeneous marrow signal with heterogeneous enhancement throughout the thoracolumbar spine.  Additionally, there is subacute-chronic L3 vertebral body compression fraction. 01/14/2022: establish care with [name]  02/10/2022: bone marrow biopsy shows range of plasma cell involvement from 50% on the biopsy to 60% on aspirate smear slides and close to 90% on the clot section for an overall percentage estimated at approximately 80% overall. 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  02/26/2022: Cycle 1 of VRD therapy 04/01/2022: Cycle 2 of VRD therapy  04/23/2022: Cycle 3 of VRD therapy  05/14/2022: Cycle 4 of VRD therapy  06/04/2022: Underwent kyphoplasty so treatment was cancelled 06/11/2022: Resume Cycle 5 Day 8 of VRD therapy 06/25/2022: Cycle 6 Day 1 of VRD 07/16/2022: Cycle 7 Day 1 of VRD 08/06/2022: Cycle 8 Day 1 of VRD  Interval History:  [name] 52 y.o. male with medical history significant for newly diagnosed IgG kappa multiple myeloma who presents for a follow up visit. The patient's last visit was on 08/13/2022 at which time he continued on VRD therapy.    On exam today [name] reports he has been well overall in the interim since her last visit.  He reports he does have some rare occasional numbness and tingling of his hands and some occasional cramps in his hands.  He reports he takes a spoonful of mustard which sometimes helps with that discomfort.  He notes his energy levels are quite good though occasionally he can feel down.  He has  been eating well and is disheartened by the fact his weight is up to 232 pounds.  He notes his ideal weight would be 215 pounds.  He does have some occasional nausea when bending over and standing up quickly but for the most part does not notice any nausea, vomiting, or diarrhea with his treatment.  His Revlimid pills are arriving on time and he is currently planning for his week off after his dose tomorrow.  Overall he is willing and able to proceed with chemotherapy at this time.  He denies fevers, chills, sweats, shortness of breath, chest pain or cough. He has no other complaints. A full 10 point ROS was otherwise negative.  MEDICAL HISTORY:  Past Medical History:  Diagnosis Date   Allergy    Anemia    Atypical chest pain    Cancer (HCC)    Multiple myeloma with normocytic anemia   GERD (gastroesophageal reflux disease)    Hepatic cyst    Benign by MRI   Hiatal hernia    History of colonic polyps    Hyperlipidemia    Hypertension    Rectal bleeding    Tobacco abuse     SURGICAL HISTORY: Past Surgical History:  Procedure Laterality Date   COLONOSCOPY  01/10/2009     RMR: Normal rectum, normal colon; repeat in 2015 due to [place] of colon cancer   COLONOSCOPY N/A 01/09/2014   [id number] colonic polyps-removed as described   COLONOSCOPY N/A 09/18/2016   Procedure: COLONOSCOPY;  Surgeon: Daneil Dolin, MD;  [facility];  Service: Endoscopy;  Laterality: N/A;  730    ESOPHAGOGASTRODUODENOSCOPY  10/04/2007  RMR: Distal esophageal erosions consistent with erosive reflux esophagitis, patulous gastroesophageal junction status post passage of a  Maloney dilator, 59 [place].  Otherwise, unremarkable esophagus.  Hiatal hernia.  Otherwise normal stomach.  Bulbar erosion   ESOPHAGOGASTRODUODENOSCOPY N/A 01/09/2014   Erosive reflux esophagitis. Small hiatal hernia   HUMERUS IM NAIL Left 02/22/2022   Procedure: INTRAMEDULLARY (IM) NAIL HUMERAL;  Surgeon: Nicholes Stairs, MD;   Location: [facility];  Service: Orthopedics;  Laterality: Left;   I & D EXTREMITY Left 07/26/2018   Procedure: IRRIGATION AND DEBRIDEMENT EXTREMITY;  Surgeon: Dayna Barker, MD;  Location: [facility];  Service: Plastics;  Laterality: Left;   IR BONE TUMOR(S)RF ABLATION  02/05/2022   IR BONE TUMOR(S)RF ABLATION  02/24/2022   IR KYPHO EA ADDL LEVEL THORACIC OR LUMBAR  02/19/2022   IR KYPHO LUMBAR INC FX REDUCE BONE BX UNI/BIL CANNULATION INC/IMAGING  02/05/2022   IR KYPHO LUMBAR INC FX REDUCE BONE BX UNI/BIL CANNULATION INC/IMAGING  02/19/2022   PERCUTANEOUS PINNING Left 07/26/2018   Procedure: PERCUTANEOUS PINNING EXTREMITY;  Surgeon: Dayna Barker, MD;  Location: [facility];  Service: Plastics;  Laterality: Left;    SOCIAL HISTORY: Social History   Socioeconomic History   Marital status: Divorced    Spouse name: Not on file   Number of children: 2   Years of education: Not on file   Highest education level: Not on file  Occupational History   Occupation: Scientist, 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   Smoking status: Former    Packs/day: 0.25    Years: 10.00    Additional pack years: 0.00    Total pack years: 2.50    Types: Cigarettes    Quit date: 12/24/2021    Years since quitting: 0.6   Smokeless tobacco: Never  Vaping Use   Vaping Use: Never used  Substance and Sexual Activity   Alcohol use: Yes    Alcohol/week: 0.0 standard drinks of alcohol    Comment: occasional/wine on the weekends   Drug use: No   Sexual activity: Not on file  Other Topics Concern   Not on file  Social History Narrative   Right handed   Lives alone one story home   Social Determinants of Health   Financial Resource Strain: Not on file  Food Insecurity: No Food Insecurity (07/17/2022)   Hunger Vital Sign    Worried About Running Out of Food in the Last Year: Never true    Ran Out of Food in the Last Year: Never true  Transportation Needs: No Transportation  Needs (07/17/2022)   PRAPARE - Transportation    Lack of Transportation (Medical): No    Lack of Transportation (Non-Medical): No  Physical Activity: Not on file  Stress: Not on file  Social Connections: Not on file  Intimate Partner Violence: Not At Risk (07/17/2022)   Humiliation, Afraid, Rape, and Kick questionnaire    Fear of Current or Ex-Partner: No    Emotionally Abused: No    Physically Abused: No    Sexually Abused: No    FAMILY HISTORY: Family History  Problem Relation Age of Onset   Heart disease Mother    Hypertension Mother    Diabetes Father    Hypertension Father    Colon cancer Sister        passed away from colon cancer, in her 75s   Heart attack Maternal Uncle    Prostate cancer Maternal Uncle  Diabetes Other    Pancreatic cancer Neg Hx    Rectal cancer Neg Hx    Esophageal cancer Neg Hx    Stomach cancer Neg Hx     ALLERGIES:  has No Known Allergies.  MEDICATIONS:  Current Outpatient Medications  Medication Sig Dispense Refill   acyclovir (ZOVIRAX) 400 MG tablet Take 1 tablet (400 mg total) by mouth 2 (two) times daily. 60 tablet 3   allopurinol (ZYLOPRIM) 300 MG tablet Take 1 tablet (300 mg total) by mouth daily. 30 tablet 3   aspirin EC 81 MG tablet Take 81 mg by mouth daily. Swallow whole.     clonazePAM (KLONOPIN) 0.5 MG tablet Take 1 tablet (0.5 mg total) by mouth 2 (two) times daily as needed for anxiety. 15 tablet 0   DULoxetine (CYMBALTA) 20 MG capsule Take 1 capsule (20 mg total) by mouth daily. 30 capsule 1   gabapentin (NEURONTIN) 100 MG capsule Take 1 capsule by mouth twice daily 60 capsule 2   hydrochlorothiazide (HYDRODIURIL) 25 MG tablet Take 25 mg by mouth daily.     lisinopril (ZESTRIL) 40 MG tablet Take 40 mg by mouth daily.     Methocarbamol 1000 MG TABS Take 1,000 mg by mouth every 6 (six) hours as needed. (Patient taking differently: Take 1,000 mg by mouth every 6 (six) hours as needed (muscle spasms).) 45 tablet 1   morphine (MS  CONTIN) 30 MG 12 hr tablet Take 1 tablet by mouth every 8 hours. (Patient taking differently: Take 30 mg by mouth every 8 (eight) hours as needed for pain.) 60 tablet 0   ondansetron (ZOFRAN) 8 MG tablet Take 1 tablet (8 mg total) by mouth every 8 (eight) hours as needed for nausea or vomiting. 60 tablet 3   pantoprazole (PROTONIX) 40 MG tablet Take 1 tablet by mouth twice daily 60 tablet 0   polyethylene glycol powder (GLYCOLAX/MIRALAX) 17 GM/SCOOP powder Take 1 capful (17 g) with water by mouth daily. (Patient taking differently: Take 17 g by mouth daily as needed for mild constipation.) 238 g 0   prochlorperazine (COMPAZINE) 10 MG tablet Take 1 tablet (10 mg total) by mouth every 6 (six) hours as needed for nausea or vomiting. 60 tablet 0   REVLIMID 25 MG capsule Take 1 capsule (25 mg total) by mouth daily. Take one capsule daily x 14 days. None for following 7 days 14 capsule 0   rosuvastatin (CRESTOR) 10 MG tablet Take 1 tablet (10 mg total) by mouth daily. 90 tablet 1   cetirizine (ZYRTEC) 10 MG tablet Take 10 mg by mouth daily as needed for allergies. (Patient not taking: Reported on 08/26/2022)     docusate sodium (COLACE) 100 MG capsule Take 1 capsule (100 mg total) by mouth 2 (two) times daily as needed for mild constipation. (Patient not taking: Reported on 08/26/2022) 30 capsule 3   No current facility-administered medications for this visit.    REVIEW OF SYSTEMS:   Constitutional: ( - ) fevers, ( - )  chills , ( - ) night sweats Eyes: ( - ) blurriness of vision, ( - ) double vision, ( - ) watery eyes Ears, nose, mouth, throat, and face: ( - ) mucositis, ( - ) sore throat Respiratory: ( - ) cough, ( - ) dyspnea, ( - ) wheezes Cardiovascular: ( - ) palpitation, ( - ) chest discomfort, ( - ) lower extremity swelling Gastrointestinal:  ( - ) nausea, ( - ) heartburn, ( - ) change  in bowel habits Skin: ( - ) abnormal skin rashes Lymphatics: ( - ) new lymphadenopathy, ( - ) easy  bruising Neurological: ( - ) numbness, ( - ) tingling, ( - ) new weaknesses Behavioral/Psych: ( - ) mood change, ( - ) new changes  All other systems were reviewed with the patient and are negative.  PHYSICAL EXAMINATION: ECOG PERFORMANCE STATUS: 1 - Symptomatic but completely ambulatory  Vitals:   08/26/22 1309  BP: 125/80  Pulse: 77  Resp: 17  Temp: 99.1 F (37.3 C)  SpO2: 99%    Filed Weights   08/26/22 1309  Weight: 232 lb (105.2 kg)     GENERAL: Well-appearing young African-American male, alert, no distress and comfortable SKIN: skin color, texture, turgor are normal, no rashes or significant lesions EYES: conjunctiva are pink and non-injected, sclera clear LUNGS: clear to auscultation and percussion with normal breathing effort HEART: regular rate & rhythm and no murmurs and no lower extremity edema Musculoskeletal: no cyanosis of digits and no clubbing  PSYCH: alert & oriented x 3, fluent speech NEURO: no focal motor/sensory deficits  LABORATORY DATA:  I have reviewed the data as listed    Latest Ref Rng & Units 08/26/2022   12:30 PM 08/20/2022    7:18 AM 08/13/2022   12:16 PM  CBC  WBC 4.0 - 10.5 K/uL 4.6  5.2  6.1   Hemoglobin 13.0 - 17.0 g/dL 12.1  11.9  12.4   Hematocrit 39.0 - 52.0 % 37.1  37.0  37.5   Platelets 150 - 400 K/uL 195  193  186        Latest Ref Rng & Units 08/26/2022   12:30 PM 08/20/2022    7:18 AM 08/13/2022   12:16 PM  CMP  Glucose 70 - 99 mg/dL 111  116  111   BUN 6 - 20 mg/dL 11  13  14    Creatinine 0.61 - 1.24 mg/dL 0.88  0.98  0.86   Sodium 135 - 145 mmol/L 136  135  134   Potassium 3.5 - 5.1 mmol/L 4.1  4.0  4.0   Chloride 98 - 111 mmol/L 104  100  101   CO2 22 - 32 mmol/L 26  27  28    Calcium 8.9 - 10.3 mg/dL 9.1  8.8  9.4   Total Protein 6.5 - 8.1 g/dL 6.9  6.9  6.9   Total Bilirubin 0.3 - 1.2 mg/dL 0.5  0.5  0.5   Alkaline Phos 38 - 126 U/L 52  52  61   AST 15 - 41 U/L 13  14  13    ALT 0 - 44 U/L 16  17  15      Lab  Results  Component Value Date   MPROTEIN 0.5 (H) 08/06/2022   MPROTEIN 0.5 (H) 07/16/2022   MPROTEIN 0.8 (H) 06/18/2022   Lab Results  Component Value Date   KPAFRELGTCHN 17.5 08/06/2022   KPAFRELGTCHN 14.0 07/16/2022   KPAFRELGTCHN 24.0 (H) 06/25/2022   LAMBDASER 9.1 08/06/2022   LAMBDASER 9.1 07/16/2022   LAMBDASER 9.9 06/25/2022   KAPLAMBRATIO 1.92 (H) 08/06/2022   KAPLAMBRATIO 1.54 07/16/2022   KAPLAMBRATIO 2.42 (H) 06/25/2022   RADIOGRAPHIC STUDIES: No results found.  ASSESSMENT & PLAN [name] 52 y.o. male with medical history significant for IgG kappa multiple myeloma who presents for a follow up visit.  After review of the labs, review the records, discussion with the patient the findings are most consistent with newly diagnosed  multiple myeloma.  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  At this time would recommend proceeding with VRD chemotherapy with the intention of referral for ASCT when his labs are appropriate.  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 -- Recommend VRD chemotherapy with intention of proceeding to transplant. -- Labs at each visit to include CBC, CMP, LDH with monthly restaging labs SPEP and serum free light chains -- Started VRD therapy on 02/26/2022.  PLAN: --Presents today to start Cycle 9 , Day 1 of VRD therapy --Labs from today were reviewed and adequate for treatment. WBC 4.6, Hgb 12.1, MCV 87.7, Plt 195 --Most recent myeloma labs from 08/06/2022 shows M protein measuring 0.5 g/dL and normalized serum free light chains.  --Proceed with treatment today as planned without any dose modifications --Continue with weekly treatments and follow up in clinic in 2 weeks.   # Leg Discomfort/Restless Leg -- Patient provided with Requip by the  emergency department, discontinued due to poor efficacy. --Evaluated by Dr. Mickeal Skinner on 07/28/2022. Recommended to short course of steroids so started prednisone 50 mg daily x 7 days today. Discussed etiologies including iron deficiency so iron levels were checked and was normal.   #Constipation: --Recommend to continue on colace but add miralax as well.   #Pathologic fractures-secondary to MM: --Involving L1, L2 and L3 compression fracture and left humerus fracture.  --Underwent kyphoplasty of L3 fracture on 02/19/2022 --Underwent medullary nailing of left humeral shaft on 02/22/2022 --Underwent kyphoplasty on 06/04/2022.   #Pain Medication -- Per palliative care patient has weaned off MS contin. Currently conitnues on gabapentin 100 mg BID, cymbalta 20 mg and robaxin as needed.  --Under the care of palliative care team.   #Supportive Care -- chemotherapy education complete -- port placement not required -- zofran 8mg  q8H PRN and compazine 10mg  PO q6H for nausea -- acyclovir 400mg  PO BID for VCZ prophylaxis -- allopurinol 300mg  PO daily for TLS prophylaxis -- Dental clearance for Zometa/Denosumab approved. Last dose of denosumab 07/16/2022. Continue monthly.   Orders Placed This Encounter  Procedures   Multiple Myeloma Panel (SPEP&IFE w/QIG)    Standing Status:   Future    Standing Expiration Date:   10/08/2023   Kappa/lambda light chains    Standing Status:   Future    Standing Expiration Date:   10/08/2023   CBC with Differential ([facility] Only)    Standing Status:   Future    Standing Expiration Date:   10/08/2023   CMP ([facility] only)    Standing Status:   Future    Standing Expiration Date:   10/08/2023   CBC with Differential ([facility] Only)    Standing Status:   Future    Standing Expiration Date:   10/15/2023   CMP ([place] only)    Standing Status:   Future    Standing Expiration Date:   10/15/2023   CBC with Differential ([facility] Only)    Standing  Status:   Future    Standing Expiration Date:   10/22/2023   CMP ([facility] only)    Standing Status:   Future    Standing Expiration Date:   10/22/2023   Multiple Myeloma Panel (SPEP&IFE w/QIG)    Standing Status:   Future    Standing Expiration Date:   10/29/2023  Kappa/lambda light chains    Standing Status:   Future    Standing Expiration Date:   10/29/2023   CBC with Differential ([facility] Only)    Standing Status:   Future    Standing Expiration Date:   10/29/2023   CMP ([facility] only)    Standing Status:   Future    Standing Expiration Date:   10/29/2023   CBC with Differential ([facility] Only)    Standing Status:   Future    Standing Expiration Date:   11/05/2023   CMP ([facility] only)    Standing Status:   Future    Standing Expiration Date:   11/05/2023   CBC with Differential ([facility] Only)    Standing Status:   Future    Standing Expiration Date:   11/12/2023   CMP ([facility] only)    Standing Status:   Future    Standing Expiration Date:   11/12/2023    All questions were answered. The patient knows to call the clinic with any problems, questions or concerns.  I have spent a total of 30 minutes minutes of face-to-face and non-face-to-face time, preparing to see the [facility] a medically appropriate examination, counseling and educating the patient, ordering medications/tests, documenting clinical information in the electronic health record,  and care coordination.   Ledell Peoples, MD Department of Hematology/Oncology [place] at [facility] Phone: [phone] Pager: [phone] Email: [email or web address]  08/26/2022 2:26 PM

## 2022-08-26 ENCOUNTER — Inpatient Hospital Stay: Payer: BC Managed Care – PPO | Admitting: Hematology and Oncology

## 2022-08-26 ENCOUNTER — Inpatient Hospital Stay: Payer: BC Managed Care – PPO | Attending: Physician Assistant

## 2022-08-26 ENCOUNTER — Other Ambulatory Visit: Payer: Self-pay

## 2022-08-26 ENCOUNTER — Inpatient Hospital Stay: Payer: BC Managed Care – PPO

## 2022-08-26 VITALS — BP 125/80 | HR 77 | Temp 99.1°F | Resp 17 | Wt 232.0 lb

## 2022-08-26 VITALS — BP 122/77 | HR 81 | Resp 16

## 2022-08-26 DIAGNOSIS — R110 Nausea: Secondary | ICD-10-CM | POA: Diagnosis not present

## 2022-08-26 DIAGNOSIS — Z7982 Long term (current) use of aspirin: Secondary | ICD-10-CM | POA: Diagnosis not present

## 2022-08-26 DIAGNOSIS — E785 Hyperlipidemia, unspecified: Secondary | ICD-10-CM | POA: Insufficient documentation

## 2022-08-26 DIAGNOSIS — K5900 Constipation, unspecified: Secondary | ICD-10-CM | POA: Insufficient documentation

## 2022-08-26 DIAGNOSIS — K219 Gastro-esophageal reflux disease without esophagitis: Secondary | ICD-10-CM | POA: Insufficient documentation

## 2022-08-26 DIAGNOSIS — Z5112 Encounter for antineoplastic immunotherapy: Secondary | ICD-10-CM | POA: Diagnosis not present

## 2022-08-26 DIAGNOSIS — C9000 Multiple myeloma not having achieved remission: Secondary | ICD-10-CM

## 2022-08-26 DIAGNOSIS — G2581 Restless legs syndrome: Secondary | ICD-10-CM | POA: Insufficient documentation

## 2022-08-26 DIAGNOSIS — Z79899 Other long term (current) drug therapy: Secondary | ICD-10-CM | POA: Insufficient documentation

## 2022-08-26 DIAGNOSIS — I10 Essential (primary) hypertension: Secondary | ICD-10-CM | POA: Insufficient documentation

## 2022-08-26 DIAGNOSIS — Z7961 Long term (current) use of immunomodulator: Secondary | ICD-10-CM | POA: Diagnosis not present

## 2022-08-26 DIAGNOSIS — R200 Anesthesia of skin: Secondary | ICD-10-CM | POA: Diagnosis not present

## 2022-08-26 DIAGNOSIS — R350 Frequency of micturition: Secondary | ICD-10-CM | POA: Diagnosis not present

## 2022-08-26 DIAGNOSIS — Z800 Family history of malignant neoplasm of digestive organs: Secondary | ICD-10-CM | POA: Insufficient documentation

## 2022-08-26 DIAGNOSIS — Z87891 Personal history of nicotine dependence: Secondary | ICD-10-CM | POA: Diagnosis not present

## 2022-08-26 LAB — CBC WITH DIFFERENTIAL (CANCER CENTER ONLY)
Abs Immature Granulocytes: 0.06 10*3/uL (ref 0.00–0.07)
Basophils Absolute: 0 10*3/uL (ref 0.0–0.1)
Basophils Relative: 0 %
Eosinophils Absolute: 0 10*3/uL (ref 0.0–0.5)
Eosinophils Relative: 1 %
HCT: 37.1 % — ABNORMAL LOW (ref 39.0–52.0)
Hemoglobin: 12.1 g/dL — ABNORMAL LOW (ref 13.0–17.0)
Immature Granulocytes: 1 %
Lymphocytes Relative: 25 %
Lymphs Abs: 1.1 10*3/uL (ref 0.7–4.0)
MCH: 28.6 pg (ref 26.0–34.0)
MCHC: 32.6 g/dL (ref 30.0–36.0)
MCV: 87.7 fL (ref 80.0–100.0)
Monocytes Absolute: 0.5 10*3/uL (ref 0.1–1.0)
Monocytes Relative: 11 %
Neutro Abs: 2.9 10*3/uL (ref 1.7–7.7)
Neutrophils Relative %: 62 %
Platelet Count: 195 10*3/uL (ref 150–400)
RBC: 4.23 MIL/uL (ref 4.22–5.81)
RDW: 14.6 % (ref 11.5–15.5)
WBC Count: 4.6 10*3/uL (ref 4.0–10.5)
nRBC: 0 % (ref 0.0–0.2)

## 2022-08-26 LAB — CMP (CANCER CENTER ONLY)
ALT: 16 U/L (ref 0–44)
AST: 13 U/L — ABNORMAL LOW (ref 15–41)
Albumin: 4 g/dL (ref 3.5–5.0)
Alkaline Phosphatase: 52 U/L (ref 38–126)
Anion gap: 6 (ref 5–15)
BUN: 11 mg/dL (ref 6–20)
CO2: 26 mmol/L (ref 22–32)
Calcium: 9.1 mg/dL (ref 8.9–10.3)
Chloride: 104 mmol/L (ref 98–111)
Creatinine: 0.88 mg/dL (ref 0.61–1.24)
GFR, Estimated: >60 mL/min (ref >60)
Glucose, Bld: 111 mg/dL — ABNORMAL HIGH (ref 70–99)
Potassium: 4.1 mmol/L (ref 3.5–5.1)
Sodium: 136 mmol/L (ref 135–145)
Total Bilirubin: 0.5 mg/dL (ref 0.3–1.2)
Total Protein: 6.9 g/dL (ref 6.5–8.1)

## 2022-08-26 MED ORDER — DEXAMETHASONE 4 MG PO TABS
40.0000 mg | ORAL_TABLET | Freq: Once | ORAL | Status: AC
  Administered 2022-08-26: 40 mg via ORAL
  Filled 2022-08-26: qty 10

## 2022-08-26 MED ORDER — BORTEZOMIB CHEMO SQ INJECTION 3.5 MG (2.5MG/ML)
1.3000 mg/m2 | Freq: Once | INTRAMUSCULAR | Status: AC
  Administered 2022-08-26: 3 mg via SUBCUTANEOUS
  Filled 2022-08-26: qty 1.2

## 2022-08-26 NOTE — Patient Instructions (Signed)
[facility]  Discharge Instructions: Thank you for choosing [facility] to provide your oncology and hematology care.   If you have a lab appointment with the Cancer Center, please go directly to the Cancer Center and check in at the registration area.   Wear comfortable clothing and clothing appropriate for easy access to any Portacath or PICC line.   We strive to give you quality time with your provider. You may need to reschedule your appointment if you arrive late (15 or more minutes).  Arriving late affects you and other patients whose appointments are after yours.  Also, if you miss three or more appointments without notifying the office, you may be dismissed from the clinic at the provider's discretion.      For prescription refill requests, have your pharmacy contact our office and allow 72 hours for refills to be completed.    Today you received the following chemotherapy and/or immunotherapy agents: bortezomib      To help prevent nausea and vomiting after your treatment, we encourage you to take your nausea medication as directed.  BELOW ARE SYMPTOMS THAT SHOULD BE REPORTED IMMEDIATELY: *FEVER GREATER THAN 100.4 F (38 C) OR HIGHER *CHILLS OR SWEATING *NAUSEA AND VOMITING THAT IS NOT CONTROLLED WITH YOUR NAUSEA MEDICATION *UNUSUAL SHORTNESS OF BREATH *UNUSUAL BRUISING OR BLEEDING *URINARY PROBLEMS (pain or burning when urinating, or frequent urination) *BOWEL PROBLEMS (unusual diarrhea, constipation, pain near the anus) TENDERNESS IN MOUTH AND THROAT WITH OR WITHOUT PRESENCE OF ULCERS (sore throat, sores in mouth, or a toothache) UNUSUAL RASH, SWELLING OR PAIN  UNUSUAL VAGINAL DISCHARGE OR ITCHING   Items with * indicate a potential emergency and should be followed up as soon as possible or go to the Emergency Department if any problems should occur.  Please show the CHEMOTHERAPY ALERT CARD or IMMUNOTHERAPY ALERT CARD at  check-in to the Emergency Department and triage nurse.  Should you have questions after your visit or need to cancel or reschedule your appointment, please contact [facility]  Dept: [phone]  and follow the prompts.  Office hours are 8:00 a.m. to 4:30 p.m. Monday - Friday. Please note that voicemails left after 4:00 p.m. may not be returned until the following business day.  We are closed weekends and major holidays. You have access to a nurse at all times for urgent questions. Please call the main number to the clinic Dept: [phone] and follow the prompts.   For any non-urgent questions, you may also contact your provider using MyChart. We now offer e-Visits for anyone 18 and older to request care online for non-urgent symptoms. For details visit [email or web address].   Also download the MyChart app! Go to the app store, search "MyChart", open the app, select [facility], and log in with your MyChart username and password.   

## 2022-08-27 ENCOUNTER — Inpatient Hospital Stay: Payer: BC Managed Care – PPO

## 2022-08-27 ENCOUNTER — Inpatient Hospital Stay: Payer: BC Managed Care – PPO | Admitting: Hematology and Oncology

## 2022-08-27 ENCOUNTER — Encounter: Payer: Self-pay | Admitting: Hematology and Oncology

## 2022-08-27 ENCOUNTER — Other Ambulatory Visit: Payer: Self-pay | Admitting: *Deleted

## 2022-08-27 DIAGNOSIS — C9000 Multiple myeloma not having achieved remission: Secondary | ICD-10-CM

## 2022-08-27 LAB — KAPPA/LAMBDA LIGHT CHAINS
Kappa free light chain: 17.1 mg/L (ref 3.3–19.4)
Kappa, lambda light chain ratio: 1.86 — ABNORMAL HIGH (ref 0.26–1.65)
Lambda free light chains: 9.2 mg/L (ref 5.7–26.3)

## 2022-08-27 MED ORDER — REVLIMID 25 MG PO CAPS
25.0000 mg | ORAL_CAPSULE | Freq: Every day | ORAL | 0 refills | Status: DC
Start: 2022-08-27 — End: 2022-09-11

## 2022-08-29 LAB — MULTIPLE MYELOMA PANEL, SERUM
Albumin SerPl Elph-Mcnc: 3.5 g/dL (ref 2.9–4.4)
Albumin/Glob SerPl: 1.3 (ref 0.7–1.7)
Alpha 1: 0.2 g/dL (ref 0.0–0.4)
Alpha2 Glob SerPl Elph-Mcnc: 0.9 g/dL (ref 0.4–1.0)
B-Globulin SerPl Elph-Mcnc: 0.9 g/dL (ref 0.7–1.3)
Gamma Glob SerPl Elph-Mcnc: 0.7 g/dL (ref 0.4–1.8)
Globulin, Total: 2.8 g/dL (ref 2.2–3.9)
IgA: 41 mg/dL — ABNORMAL LOW (ref 90–386)
IgG (Immunoglobin G), Serum: 831 mg/dL (ref 603–1613)
IgM (Immunoglobulin M), Srm: 46 mg/dL (ref 20–172)
M Protein SerPl Elph-Mcnc: 0.3 g/dL — ABNORMAL HIGH
Total Protein ELP: 6.3 g/dL (ref 6.0–8.5)

## 2022-09-01 ENCOUNTER — Telehealth: Payer: Self-pay | Admitting: Hematology and Oncology

## 2022-09-01 NOTE — Telephone Encounter (Signed)
Reached out to patient to schedule, no answer.

## 2022-09-02 DIAGNOSIS — R2689 Other abnormalities of gait and mobility: Secondary | ICD-10-CM | POA: Diagnosis not present

## 2022-09-02 DIAGNOSIS — M4802 Spinal stenosis, cervical region: Secondary | ICD-10-CM | POA: Diagnosis not present

## 2022-09-02 DIAGNOSIS — M6281 Muscle weakness (generalized): Secondary | ICD-10-CM | POA: Diagnosis not present

## 2022-09-02 DIAGNOSIS — C9000 Multiple myeloma not having achieved remission: Secondary | ICD-10-CM | POA: Diagnosis not present

## 2022-09-03 ENCOUNTER — Inpatient Hospital Stay: Payer: BC Managed Care – PPO

## 2022-09-03 ENCOUNTER — Inpatient Hospital Stay: Payer: BC Managed Care – PPO | Admitting: Physician Assistant

## 2022-09-03 ENCOUNTER — Other Ambulatory Visit: Payer: Self-pay

## 2022-09-03 VITALS — BP 114/74 | HR 99 | Temp 97.5°F | Resp 18 | Ht 69.0 in | Wt 232.2 lb

## 2022-09-03 DIAGNOSIS — K219 Gastro-esophageal reflux disease without esophagitis: Secondary | ICD-10-CM | POA: Diagnosis not present

## 2022-09-03 DIAGNOSIS — K5900 Constipation, unspecified: Secondary | ICD-10-CM | POA: Diagnosis not present

## 2022-09-03 DIAGNOSIS — R200 Anesthesia of skin: Secondary | ICD-10-CM | POA: Diagnosis not present

## 2022-09-03 DIAGNOSIS — Z7982 Long term (current) use of aspirin: Secondary | ICD-10-CM | POA: Diagnosis not present

## 2022-09-03 DIAGNOSIS — Z5112 Encounter for antineoplastic immunotherapy: Secondary | ICD-10-CM | POA: Diagnosis not present

## 2022-09-03 DIAGNOSIS — E785 Hyperlipidemia, unspecified: Secondary | ICD-10-CM | POA: Diagnosis not present

## 2022-09-03 DIAGNOSIS — C9000 Multiple myeloma not having achieved remission: Secondary | ICD-10-CM

## 2022-09-03 DIAGNOSIS — Z79899 Other long term (current) drug therapy: Secondary | ICD-10-CM | POA: Diagnosis not present

## 2022-09-03 DIAGNOSIS — Z800 Family history of malignant neoplasm of digestive organs: Secondary | ICD-10-CM | POA: Diagnosis not present

## 2022-09-03 DIAGNOSIS — G2581 Restless legs syndrome: Secondary | ICD-10-CM | POA: Diagnosis not present

## 2022-09-03 DIAGNOSIS — R350 Frequency of micturition: Secondary | ICD-10-CM

## 2022-09-03 DIAGNOSIS — I10 Essential (primary) hypertension: Secondary | ICD-10-CM | POA: Diagnosis not present

## 2022-09-03 DIAGNOSIS — R110 Nausea: Secondary | ICD-10-CM | POA: Diagnosis not present

## 2022-09-03 DIAGNOSIS — Z7961 Long term (current) use of immunomodulator: Secondary | ICD-10-CM | POA: Diagnosis not present

## 2022-09-03 DIAGNOSIS — Z87891 Personal history of nicotine dependence: Secondary | ICD-10-CM | POA: Diagnosis not present

## 2022-09-03 LAB — CMP (CANCER CENTER ONLY)
ALT: 14 U/L (ref 0–44)
AST: 14 U/L — ABNORMAL LOW (ref 15–41)
Albumin: 4.1 g/dL (ref 3.5–5.0)
Alkaline Phosphatase: 56 U/L (ref 38–126)
Anion gap: 5 (ref 5–15)
BUN: 17 mg/dL (ref 6–20)
CO2: 29 mmol/L (ref 22–32)
Calcium: 9.4 mg/dL (ref 8.9–10.3)
Chloride: 103 mmol/L (ref 98–111)
Creatinine: 0.91 mg/dL (ref 0.61–1.24)
GFR, Estimated: >60 mL/min (ref >60)
Glucose, Bld: 110 mg/dL — ABNORMAL HIGH (ref 70–99)
Potassium: 4 mmol/L (ref 3.5–5.1)
Sodium: 137 mmol/L (ref 135–145)
Total Bilirubin: 0.5 mg/dL (ref 0.3–1.2)
Total Protein: 7.1 g/dL (ref 6.5–8.1)

## 2022-09-03 LAB — CBC WITH DIFFERENTIAL (CANCER CENTER ONLY)
Abs Immature Granulocytes: 0.03 10*3/uL (ref 0.00–0.07)
Basophils Absolute: 0 10*3/uL (ref 0.0–0.1)
Basophils Relative: 0 %
Eosinophils Absolute: 0 10*3/uL (ref 0.0–0.5)
Eosinophils Relative: 0 %
HCT: 37.2 % — ABNORMAL LOW (ref 39.0–52.0)
Hemoglobin: 12.3 g/dL — ABNORMAL LOW (ref 13.0–17.0)
Immature Granulocytes: 0 %
Lymphocytes Relative: 31 %
Lymphs Abs: 2.1 10*3/uL (ref 0.7–4.0)
MCH: 29 pg (ref 26.0–34.0)
MCHC: 33.1 g/dL (ref 30.0–36.0)
MCV: 87.7 fL (ref 80.0–100.0)
Monocytes Absolute: 0.8 10*3/uL (ref 0.1–1.0)
Monocytes Relative: 12 %
Neutro Abs: 3.8 10*3/uL (ref 1.7–7.7)
Neutrophils Relative %: 57 %
Platelet Count: 216 10*3/uL (ref 150–400)
RBC: 4.24 MIL/uL (ref 4.22–5.81)
RDW: 14.3 % (ref 11.5–15.5)
WBC Count: 6.7 10*3/uL (ref 4.0–10.5)
nRBC: 0 % (ref 0.0–0.2)

## 2022-09-03 LAB — URINALYSIS, COMPLETE (UACMP) WITH MICROSCOPIC
Bacteria, UA: NONE SEEN
Bilirubin Urine: NEGATIVE
Glucose, UA: NEGATIVE mg/dL
Hgb urine dipstick: NEGATIVE
Ketones, ur: NEGATIVE mg/dL
Leukocytes,Ua: NEGATIVE
Nitrite: NEGATIVE
Protein, ur: NEGATIVE mg/dL
Specific Gravity, Urine: 1.009 (ref 1.005–1.030)
pH: 7 (ref 5.0–8.0)

## 2022-09-03 MED ORDER — DEXAMETHASONE 4 MG PO TABS
40.0000 mg | ORAL_TABLET | Freq: Once | ORAL | Status: AC
  Administered 2022-09-03: 40 mg via ORAL
  Filled 2022-09-03: qty 10

## 2022-09-03 MED ORDER — BORTEZOMIB CHEMO SQ INJECTION 3.5 MG (2.5MG/ML)
1.3000 mg/m2 | Freq: Once | INTRAMUSCULAR | Status: AC
  Administered 2022-09-03: 3 mg via SUBCUTANEOUS
  Filled 2022-09-03: qty 1.2

## 2022-09-03 NOTE — Patient Instructions (Signed)
[facility]  Discharge Instructions: Thank you for choosing [facility] to provide your oncology and hematology care.   If you have a lab appointment with the Cancer Center, please go directly to the Cancer Center and check in at the registration area.   Wear comfortable clothing and clothing appropriate for easy access to any Portacath or PICC line.   We strive to give you quality time with your provider. You may need to reschedule your appointment if you arrive late (15 or more minutes).  Arriving late affects you and other patients whose appointments are after yours.  Also, if you miss three or more appointments without notifying the office, you may be dismissed from the clinic at the provider's discretion.      For prescription refill requests, have your pharmacy contact our office and allow 72 hours for refills to be completed.    Today you received the following chemotherapy and/or immunotherapy agents: Velcade     To help prevent nausea and vomiting after your treatment, we encourage you to take your nausea medication as directed.  BELOW ARE SYMPTOMS THAT SHOULD BE REPORTED IMMEDIATELY: *FEVER GREATER THAN 100.4 F (38 C) OR HIGHER *CHILLS OR SWEATING *NAUSEA AND VOMITING THAT IS NOT CONTROLLED WITH YOUR NAUSEA MEDICATION *UNUSUAL SHORTNESS OF BREATH *UNUSUAL BRUISING OR BLEEDING *URINARY PROBLEMS (pain or burning when urinating, or frequent urination) *BOWEL PROBLEMS (unusual diarrhea, constipation, pain near the anus) TENDERNESS IN MOUTH AND THROAT WITH OR WITHOUT PRESENCE OF ULCERS (sore throat, sores in mouth, or a toothache) UNUSUAL RASH, SWELLING OR PAIN  UNUSUAL VAGINAL DISCHARGE OR ITCHING   Items with * indicate a potential emergency and should be followed up as soon as possible or go to the Emergency Department if any problems should occur.  Please show the CHEMOTHERAPY ALERT CARD or IMMUNOTHERAPY ALERT CARD at check-in  to the Emergency Department and triage nurse.  Should you have questions after your visit or need to cancel or reschedule your appointment, please contact [facility]  Dept: [phone]  and follow the prompts.  Office hours are 8:00 a.m. to 4:30 p.m. Monday - Friday. Please note that voicemails left after 4:00 p.m. may not be returned until the following business day.  We are closed weekends and major holidays. You have access to a nurse at all times for urgent questions. Please call the main number to the clinic Dept: [phone] and follow the prompts.   For any non-urgent questions, you may also contact your provider using MyChart. We now offer e-Visits for anyone 18 and older to request care online for non-urgent symptoms. For details visit [email or web address].   Also download the MyChart app! Go to the app store, search "MyChart", open the app, select [facility], and log in with your MyChart username and password.   

## 2022-09-03 NOTE — Progress Notes (Signed)
[facility] Telephone:([phone]   Fax:([phone]  PROGRESS NOTE  Patient Care Team: Georganna Skeans, MD as PCP - General (Family Medicine) Runell Gess, MD as PCP - Cardiology (Cardiology) Jena Gauss Gerrit Friends, MD as Attending Physician (Gastroenterology)  Hematological/Oncological History # IgG Kappa Multiple Myeloma, 1p32/1q21.  01/04/2022: MRI lumbar spine shows diffuse heterogeneous marrow signal with heterogeneous enhancement throughout the thoracolumbar spine.  Additionally, there is subacute-chronic L3 vertebral body compression fraction. 01/14/2022: establish care with Dr. Leonides Schanz  02/10/2022: bone marrow biopsy shows range of plasma cell involvement from 50% on the biopsy to 60% on aspirate smear slides and close to 90% on the clot section for an overall percentage estimated at approximately 80% overall. 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  02/26/2022: Cycle 1 of VRD therapy 04/01/2022: Cycle 2 of VRD therapy  04/23/2022: Cycle 3 of VRD therapy  05/14/2022: Cycle 4 of VRD therapy  06/04/2022: Underwent kyphoplasty so treatment was cancelled 06/11/2022: Resume Cycle 5 Day 8 of VRD therapy 06/25/2022: Cycle 6 Day 1 of VRD 07/16/2022: Cycle 7 Day 1 of VRD 08/06/2022: Cycle 8 Day 1 of VRD 08/26/2022: Cycle 9 Day 1 of VRD  Interval History:  CONN TROMBETTA 52 y.o. male with medical history significant for newly diagnosed IgG kappa multiple myeloma who presents for a follow up visit. The patient's last visit was on 08/26/2022 at which time he continued on VRD therapy.    On exam today Mr. Penson reports his energy and appetite are overall stable. He is trying to walk and exercise as much as he can. He has a good appetite without any weight changes. He deneis nausea, vomiting or abdominal pain. His bowel habits are unchanged without recurrent episodes of diarrhea or constipation. He denies easy bruising or signs of bleeding.  He reports increased urinary frequency which includes him getting up several times at night to urinate. He does add that he drinks quite a bit of fluids right before bedtime. He denies burning with urination or changes in urine color.  Overall he is willing and able to proceed with chemotherapy at this time.  He denies fevers, chills, sweats, shortness of breath, chest pain or cough. He has no other complaints. A full 10 point ROS was otherwise negative.  MEDICAL HISTORY:  Past Medical History:  Diagnosis Date   Allergy    Anemia    Atypical chest pain    Cancer (HCC)    Multiple myeloma with normocytic anemia   GERD (gastroesophageal reflux disease)    Hepatic cyst    Benign by MRI   Hiatal hernia    History of colonic polyps    Hyperlipidemia    Hypertension    Rectal bleeding    Tobacco abuse     SURGICAL HISTORY: Past Surgical History:  Procedure Laterality Date   COLONOSCOPY  01/10/2009     RMR: Normal rectum, normal colon; repeat in 2015 due to FH of colon cancer   COLONOSCOPY N/A 01/09/2014   AVW:[id number] colonic polyps-removed as described   COLONOSCOPY N/A 09/18/2016   Procedure: COLONOSCOPY;  Surgeon: Corbin Ade, MD;  Location: [facility];  Service: Endoscopy;  Laterality: N/A;  730    ESOPHAGOGASTRODUODENOSCOPY  10/04/2007   RMR: Distal esophageal erosions consistent with erosive reflux esophagitis, patulous gastroesophageal junction status post passage of a  Maloney dilator, 56 [place].  Otherwise, unremarkable esophagus.  Hiatal hernia.  Otherwise normal stomach.  Bulbar erosion   ESOPHAGOGASTRODUODENOSCOPY N/A 01/09/2014  Erosive reflux esophagitis. Small hiatal hernia   HUMERUS IM NAIL Left 02/22/2022   Procedure: INTRAMEDULLARY (IM) NAIL HUMERAL;  Surgeon: Yolonda Kida, MD;  Location: [facility] OR;  Service: Orthopedics;  Laterality: Left;   I & D EXTREMITY Left 07/26/2018   Procedure: IRRIGATION AND DEBRIDEMENT EXTREMITY;  Surgeon: Knute Neu, MD;   Location: [facility] OR;  Service: Plastics;  Laterality: Left;   IR BONE TUMOR(S)RF ABLATION  02/05/2022   IR BONE TUMOR(S)RF ABLATION  02/24/2022   IR KYPHO EA ADDL LEVEL THORACIC OR LUMBAR  02/19/2022   IR KYPHO LUMBAR INC FX REDUCE BONE BX UNI/BIL CANNULATION INC/IMAGING  02/05/2022   IR KYPHO LUMBAR INC FX REDUCE BONE BX UNI/BIL CANNULATION INC/IMAGING  02/19/2022   PERCUTANEOUS PINNING Left 07/26/2018   Procedure: PERCUTANEOUS PINNING EXTREMITY;  Surgeon: Knute Neu, MD;  Location: [facility] OR;  Service: Plastics;  Laterality: Left;    SOCIAL HISTORY: Social History   Socioeconomic History   Marital status: Divorced    Spouse name: Not on file   Number of children: 2   Years of education: Not on file   Highest education level: Not on file  Occupational History   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[employer]    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[employer]  Tobacco Use   Smoking status: Former    Packs/day: 0.25    Years: 10.00    Additional pack years: 0.00    Total pack years: 2.50    Types: Cigarettes    Quit date: 12/24/2021    Years since quitting: 0.6   Smokeless tobacco: Never  Vaping Use   Vaping Use: Never used  Substance and Sexual Activity   Alcohol use: Yes    Alcohol/week: 0.0 standard drinks of alcohol    Comment: occasional/wine on the weekends   Drug use: No   Sexual activity: Not on file  Other Topics Concern   Not on file  Social History Narrative   Right handed   Lives alone one story home   Social Determinants of Health   Financial Resource Strain: Not on file  Food Insecurity: No Food Insecurity (07/17/2022)   Hunger Vital Sign    Worried About Running Out of Food in the Last Year: Never true    Ran Out of Food in the Last Year: Never true  Transportation Needs: No Transportation Needs (07/17/2022)   PRAPARE - Transportation    Lack of Transportation (Medical): No    Lack of Transportation (Non-Medical): No  Physical Activity: Not on file  Stress: Not  on file  Social Connections: Not on file  Intimate Partner Violence: Not At Risk (07/17/2022)   Humiliation, Afraid, Rape, and Kick questionnaire    Fear of Current or Ex-Partner: No    Emotionally Abused: No    Physically Abused: No    Sexually Abused: No    FAMILY HISTORY: Family History  Problem Relation Age of Onset   Heart disease Mother    Hypertension Mother    Diabetes Father    Hypertension Father    Colon cancer Sister        passed away from colon cancer, in her 36s   Heart attack Maternal Uncle    Prostate cancer Maternal Uncle    Diabetes Other    Pancreatic cancer Neg Hx    Rectal cancer Neg Hx    Esophageal cancer Neg Hx    Stomach cancer Neg Hx     ALLERGIES:  has No Known Allergies.  MEDICATIONS:  Current Outpatient Medications  Medication Sig Dispense Refill   acyclovir (ZOVIRAX) 400 MG tablet Take 1 tablet (400 mg total) by mouth 2 (two) times daily. 60 tablet 3   allopurinol (ZYLOPRIM) 300 MG tablet Take 1 tablet (300 mg total) by mouth daily. 30 tablet 3   aspirin EC 81 MG tablet Take 81 mg by mouth daily. Swallow whole.     cetirizine (ZYRTEC) 10 MG tablet Take 10 mg by mouth daily as needed for allergies.     clonazePAM (KLONOPIN) 0.5 MG tablet Take 1 tablet (0.5 mg total) by mouth 2 (two) times daily as needed for anxiety. 15 tablet 0   docusate sodium (COLACE) 100 MG capsule Take 1 capsule (100 mg total) by mouth 2 (two) times daily as needed for mild constipation. 30 capsule 3   DULoxetine (CYMBALTA) 20 MG capsule Take 1 capsule (20 mg total) by mouth daily. 30 capsule 1   gabapentin (NEURONTIN) 100 MG capsule Take 1 capsule by mouth twice daily 60 capsule 2   hydrochlorothiazide (HYDRODIURIL) 25 MG tablet Take 25 mg by mouth daily.     lisinopril (ZESTRIL) 40 MG tablet Take 40 mg by mouth daily.     Methocarbamol 1000 MG TABS Take 1,000 mg by mouth every 6 (six) hours as needed. (Patient taking differently: Take 1,000 mg by mouth every 6 (six)  hours as needed (muscle spasms).) 45 tablet 1   morphine (MS CONTIN) 30 MG 12 hr tablet Take 1 tablet by mouth every 8 hours. (Patient taking differently: Take 30 mg by mouth every 8 (eight) hours as needed for pain.) 60 tablet 0   ondansetron (ZOFRAN) 8 MG tablet Take 1 tablet (8 mg total) by mouth every 8 (eight) hours as needed for nausea or vomiting. 60 tablet 3   pantoprazole (PROTONIX) 40 MG tablet Take 1 tablet by mouth twice daily 60 tablet 0   polyethylene glycol powder (GLYCOLAX/MIRALAX) 17 GM/SCOOP powder Take 1 capful (17 g) with water by mouth daily. (Patient taking differently: Take 17 g by mouth daily as needed for mild constipation.) 238 g 0   prochlorperazine (COMPAZINE) 10 MG tablet Take 1 tablet (10 mg total) by mouth every 6 (six) hours as needed for nausea or vomiting. 60 tablet 0   REVLIMID 25 MG capsule Take 1 capsule (25 mg total) by mouth daily. Take one capsule daily x 14 days. None for following 7 days 14 capsule 0   rosuvastatin (CRESTOR) 10 MG tablet Take 1 tablet (10 mg total) by mouth daily. 90 tablet 1   No current facility-administered medications for this visit.    REVIEW OF SYSTEMS:   Constitutional: ( - ) fevers, ( - )  chills , ( - ) night sweats Eyes: ( - ) blurriness of vision, ( - ) double vision, ( - ) watery eyes Ears, nose, mouth, throat, and face: ( - ) mucositis, ( - ) sore throat Respiratory: ( - ) cough, ( - ) dyspnea, ( - ) wheezes Cardiovascular: ( - ) palpitation, ( - ) chest discomfort, ( - ) lower extremity swelling Gastrointestinal:  ( - ) nausea, ( - ) heartburn, ( - ) change in bowel habits Skin: ( - ) abnormal skin rashes Lymphatics: ( - ) new lymphadenopathy, ( - ) easy bruising Neurological: ( - ) numbness, ( - ) tingling, ( - ) new weaknesses Behavioral/Psych: ( - ) mood change, ( - ) new changes  All other systems were reviewed with  the patient and are negative.  PHYSICAL EXAMINATION: ECOG PERFORMANCE STATUS: 1 - Symptomatic but  completely ambulatory  Vitals:   09/03/22 1305  BP: 114/74  Pulse: 99  Resp: 18  Temp: (!) 97.5 F (36.4 C)  SpO2: 99%    Filed Weights   09/03/22 1305  Weight: 232 lb 3.2 oz (105.3 kg)     GENERAL: Well-appearing young African-American male, alert, no distress and comfortable SKIN: skin color, texture, turgor are normal, no rashes or significant lesions EYES: conjunctiva are pink and non-injected, sclera clear LUNGS: clear to auscultation and percussion with normal breathing effort HEART: regular rate & rhythm and no murmurs and no lower extremity edema Musculoskeletal: no cyanosis of digits and no clubbing  PSYCH: alert & oriented x 3, fluent speech NEURO: no focal motor/sensory deficits  LABORATORY DATA:  I have reviewed the data as listed    Latest Ref Rng & Units 09/03/2022   12:50 PM 08/26/2022   12:30 PM 08/20/2022    7:18 AM  CBC  WBC 4.0 - 10.5 K/uL 6.7  4.6  5.2   Hemoglobin 13.0 - 17.0 g/dL 08.612.3  57.812.1  46.911.9   Hematocrit 39.0 - 52.0 % 37.2  37.1  37.0   Platelets 150 - 400 K/uL 216  195  193        Latest Ref Rng & Units 09/03/2022   12:50 PM 08/26/2022   12:30 PM 08/20/2022    7:18 AM  CMP  Glucose 70 - 99 mg/dL 629110  528111  413116   BUN 6 - 20 mg/dL 17  11  13    Creatinine 0.61 - 1.24 mg/dL 2.440.91  0.100.88  2.720.98   Sodium 135 - 145 mmol/L 137  136  135   Potassium 3.5 - 5.1 mmol/L 4.0  4.1  4.0   Chloride 98 - 111 mmol/L 103  104  100   CO2 22 - 32 mmol/L 29  26  27    Calcium 8.9 - 10.3 mg/dL 9.4  9.1  8.8   Total Protein 6.5 - 8.1 g/dL 7.1  6.9  6.9   Total Bilirubin 0.3 - 1.2 mg/dL 0.5  0.5  0.5   Alkaline Phos 38 - 126 U/L 56  52  52   AST 15 - 41 U/L 14  13  14    ALT 0 - 44 U/L 14  16  17      Lab Results  Component Value Date   MPROTEIN 0.3 (H) 08/26/2022   MPROTEIN 0.5 (H) 08/06/2022   MPROTEIN 0.5 (H) 07/16/2022   Lab Results  Component Value Date   KPAFRELGTCHN 17.1 08/26/2022   KPAFRELGTCHN 17.5 08/06/2022   KPAFRELGTCHN 14.0 07/16/2022    LAMBDASER 9.2 08/26/2022   LAMBDASER 9.1 08/06/2022   LAMBDASER 9.1 07/16/2022   KAPLAMBRATIO 1.86 (H) 08/26/2022   KAPLAMBRATIO 1.92 (H) 08/06/2022   KAPLAMBRATIO 1.54 07/16/2022   RADIOGRAPHIC STUDIES: No results found.  ASSESSMENT & PLAN Federico FlakeSandy J Scorza 52 y.o. male with medical history significant for IgG kappa multiple myeloma who presents for a follow up visit.  After review of the labs, review the records, discussion with the patient the findings are most consistent with newly diagnosed multiple myeloma.  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  At this time would recommend proceeding with VRD chemotherapy with the intention of referral for ASCT when his labs are appropriate.  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  -- Diagnosis  confirmed with lytic lesions of the spine as well as biopsy-proven plasmacytoma with 80% plasma cell involvement of the bone marrow. -- Recommend VRD chemotherapy with intention of proceeding to transplant. -- Labs at each visit to include CBC, CMP, LDH with monthly restaging labs SPEP and serum free light chains -- Started VRD therapy on 02/26/2022.  PLAN: --Presents today to start Cycle 9 , Day 8 of VRD therapy --Labs from today were reviewed and adequate for treatment. WBC 6.7, Hgb 12.3, MCV 87.7, Plt 216 --Most recent myeloma labs from 08/26/2022 shows M protein measuring 0.3 g/dL and normalized serum free light chains.  --Proceed with treatment today as planned without any dose modifications --Continue with weekly treatments and follow up in clinic in 2 weeks.   #Increased urinary frequency: --Suspect secondary to increased PO intake of fluids --Will check UA and culture to rule out UTI --Glucose levels stable at 110 today. Recommend to f/u with PCP to check HA1C --Encouraged to avoid drinking fluids right before bedtime.   # Leg Discomfort/Restless Leg -- Patient provided with Requip by the  emergency department, discontinued due to poor efficacy. --Evaluated by Dr. Barbaraann Cao on 07/28/2022. Recommended to short course of steroids so started prednisone 50 mg daily x 7 days. --Discussed etiologies including iron deficiency so iron levels were checked and was normal.   #Constipation: --Recommend to continue on colace but add miralax as well.   #Pathologic fractures-secondary to MM: --Involving L1, L2 and L3 compression fracture and left humerus fracture.  --Underwent kyphoplasty of L3 fracture on 02/19/2022 --Underwent medullary nailing of left humeral shaft on 02/22/2022 --Underwent kyphoplasty on 06/04/2022.   #Pain Medication -- Per palliative care patient has weaned off MS contin. Currently conitnues on gabapentin 100 mg BID, cymbalta 20 mg and robaxin as needed.  --Under the care of palliative care team.   #Supportive Care -- chemotherapy education complete -- port placement not required -- zofran 8mg  q8H PRN and compazine 10mg  PO q6H for nausea -- acyclovir 400mg  PO BID for VCZ prophylaxis -- allopurinol 300mg  PO daily for TLS prophylaxis -- Dental clearance for Zometa/Denosumab approved. Last dose of denosumab 07/16/2022. Continue monthly.   Orders Placed This Encounter  Procedures   Culture, Urine    Standing Status:   Future    Number of Occurrences:   1    Standing Expiration Date:   09/03/2023   Urinalysis, Complete w Microscopic    Standing Status:   Future    Number of Occurrences:   1    Standing Expiration Date:   09/03/2023    All questions were answered. The patient knows to call the clinic with any problems, questions or concerns.  I have spent a total of 30 minutes minutes of face-to-face and non-face-to-face time, preparing to see the patient,performing a medically appropriate examination, counseling and educating the patient, ordering medications/tests, documenting clinical information in the electronic health record,  and care coordination.   Georga Kaufmann  PA-C Dept of Hematology and Oncology [facility] Phone: [phone]   09/03/2022 8:21 PM

## 2022-09-04 ENCOUNTER — Telehealth: Payer: Self-pay

## 2022-09-04 LAB — URINE CULTURE: Culture: NO GROWTH

## 2022-09-04 NOTE — Telephone Encounter (Signed)
Pt advised with VU 

## 2022-09-04 NOTE — Telephone Encounter (Signed)
-----   Message from Briant Cedar, PA-C sent at 09/04/2022  1:34 PM EDT ----- Please notify patient that urine was negative for infection and his glucose levels looked good.

## 2022-09-04 NOTE — Progress Notes (Signed)
[facility]  Telephone:([phone] [phone]) [phone]   Name: [name] Date: 09/04/2022 MRN: [id number]  DOB: 08/03/1970  Patient Care Team: Georganna SkeansWilson, Amelia, MD as PCP - General (Family Medicine) Runell GessBerry, Jonathan J, MD as PCP - Cardiology (Cardiology) Jena Gaussourk, Gerrit Friendsobert M, MD as Attending Physician (Gastroenterology)   INTERVAL HISTORY: [name] is a 52 y.o. male with oncologic medical history including IgG Kappa Multiple Myeloma, L1, L2, and L3 vertebral compression fraction s/p kyphoplasty(9/27) and IM nailing of left humeral shaft (9/30), currently undergoing VRD therapy.  Palliative ask to see for symptom management.   SOCIAL HISTORY:     reports that he quit smoking about 8 months ago. His smoking use included cigarettes. He has a 2.50 pack-year smoking history. He has never used smokeless tobacco. He reports current alcohol use. He reports that he does not use drugs.  ADVANCE DIRECTIVES:    CODE STATUS:   PAST MEDICAL HISTORY: Past Medical History:  Diagnosis Date   Allergy    Anemia    Atypical chest pain    Cancer (HCC)    Multiple myeloma with normocytic anemia   GERD (gastroesophageal reflux disease)    Hepatic cyst    Benign by MRI   Hiatal hernia    History of colonic polyps    Hyperlipidemia    Hypertension    Rectal bleeding    Tobacco abuse     ALLERGIES:  has No Known Allergies.  MEDICATIONS:  Current Outpatient Medications  Medication Sig Dispense Refill   acyclovir (ZOVIRAX) 400 MG tablet Take 1 tablet (400 mg total) by mouth 2 (two) times daily. 60 tablet 3   allopurinol (ZYLOPRIM) 300 MG tablet Take 1 tablet (300 mg total) by mouth daily. 30 tablet 3   aspirin EC 81 MG tablet Take 81 mg by mouth daily. Swallow whole.     cetirizine (ZYRTEC) 10 MG tablet Take 10 mg by mouth daily as needed for allergies.     clonazePAM (KLONOPIN) 0.5 MG tablet Take 1 tablet (0.5 mg total) by mouth 2 (two) times  daily as needed for anxiety. 15 tablet 0   docusate sodium (COLACE) 100 MG capsule Take 1 capsule (100 mg total) by mouth 2 (two) times daily as needed for mild constipation. 30 capsule 3   DULoxetine (CYMBALTA) 20 MG capsule Take 1 capsule (20 mg total) by mouth daily. 30 capsule 1   gabapentin (NEURONTIN) 100 MG capsule Take 1 capsule by mouth twice daily 60 capsule 2   hydrochlorothiazide (HYDRODIURIL) 25 MG tablet Take 25 mg by mouth daily.     lisinopril (ZESTRIL) 40 MG tablet Take 40 mg by mouth daily.     Methocarbamol 1000 MG TABS Take 1,000 mg by mouth every 6 (six) hours as needed. (Patient taking differently: Take 1,000 mg by mouth every 6 (six) hours as needed (muscle spasms).) 45 tablet 1   morphine (MS CONTIN) 30 MG 12 hr tablet Take 1 tablet by mouth every 8 hours. (Patient taking differently: Take 30 mg by mouth every 8 (eight) hours as needed for pain.) 60 tablet 0   ondansetron (ZOFRAN) 8 MG tablet Take 1 tablet (8 mg total) by mouth every 8 (eight) hours as needed for nausea or vomiting. 60 tablet 3   pantoprazole (PROTONIX) 40 MG tablet Take 1 tablet by mouth twice daily 60 tablet 0   polyethylene glycol powder (GLYCOLAX/MIRALAX) 17 GM/SCOOP powder Take 1 capful (17 g) with water by  mouth daily. (Patient taking differently: Take 17 g by mouth daily as needed for mild constipation.) 238 g 0   prochlorperazine (COMPAZINE) 10 MG tablet Take 1 tablet (10 mg total) by mouth every 6 (six) hours as needed for nausea or vomiting. 60 tablet 0   REVLIMID 25 MG capsule Take 1 capsule (25 mg total) by mouth daily. Take one capsule daily x 14 days. None for following 7 days 14 capsule 0   rosuvastatin (CRESTOR) 10 MG tablet Take 1 tablet (10 mg total) by mouth daily. 90 tablet 1   No current facility-administered medications for this visit.    VITAL SIGNS: There were no vitals taken for this visit. There were no vitals filed for this visit.  Estimated body mass index is 34.29 kg/m as  calculated from the following:   Height as of 09/03/22: 5\' 9"  (1.753 m).   Weight as of 09/03/22: 232 lb 3.2 oz (105.3 kg).   PERFORMANCE STATUS (ECOG) : 1 - Symptomatic but completely ambulatory  Assessment NAD, ambulatory RRR Normal breathing pattern AAO x4  IMPRESSION: [name] presents to clinic today for symptom management follow-up.  No acute distress noted.  Denies nausea, vomiting, constipation, or diarrhea.  Is remaining as active as possible.  Shares his plans to become more involved in supportive programs offered by the cancer center such as yoga.   Neoplasm related pain [name] reports pain continues to be well-controlled.    MS Contin 30 mg twice daily.  Continues with Gabapentin 100 mg twice daily, Cymbalta 20 mg, and robaxin as needed. Given controlled pain no changes at this time.  Will continue to closely monitor.  Nausea/Indigestion Resolved  Constipation Resolved with current regimen  Overall [name] is much improved.  Appreciative of where he is in his health journey.  He knows to contact our office as needed with any concerns.  PLAN:  MS Contin 30 mg every 12 hours Oxycodone 20 mg (no longer requiring) will discontinue. Gabapentin to 100 mg twice daily Cymbalta 20 mg daily  Zofran and Compazine as needed for nausea Robaxin as needed for muscle spasms Miralax daily for constipation I will plan to see patient back in 4-6 weeks in collaboration with other oncology appointments.  Patient knows to contact office sooner if needed.   Patient expressed understanding and was in agreement with this plan. He also understands that He can call the clinic at any time with any questions, concerns, or complaints.    Any controlled substances utilized were prescribed in the context of palliative care. PDMP has been reviewed.    Visit consisted of counseling and education dealing with the complex and emotionally intense issues of symptom management and palliative care in  the setting of serious and potentially life-threatening illness.Greater than 50%  of this time was spent counseling and coordinating care related to the above assessment and plan.  Willette Alma, AGPCNP-BC  Palliative Medicine Team/[facility]  *Please note that this is a verbal dictation therefore any spelling or grammatical errors are due to the "Dragon Medical One" system interpretation.

## 2022-09-05 ENCOUNTER — Other Ambulatory Visit: Payer: Self-pay | Admitting: Family Medicine

## 2022-09-09 ENCOUNTER — Other Ambulatory Visit: Payer: Self-pay

## 2022-09-09 ENCOUNTER — Other Ambulatory Visit (HOSPITAL_COMMUNITY): Payer: Self-pay

## 2022-09-09 ENCOUNTER — Other Ambulatory Visit: Payer: Self-pay | Admitting: Nurse Practitioner

## 2022-09-09 DIAGNOSIS — Z515 Encounter for palliative care: Secondary | ICD-10-CM

## 2022-09-09 DIAGNOSIS — G893 Neoplasm related pain (acute) (chronic): Secondary | ICD-10-CM

## 2022-09-09 DIAGNOSIS — C9000 Multiple myeloma not having achieved remission: Secondary | ICD-10-CM

## 2022-09-09 MED ORDER — MORPHINE SULFATE ER 30 MG PO TBCR
30.0000 mg | EXTENDED_RELEASE_TABLET | Freq: Three times a day (TID) | ORAL | 0 refills | Status: DC
Start: 2022-09-09 — End: 2022-11-14
  Filled 2022-09-09: qty 60, 20d supply, fill #0

## 2022-09-10 ENCOUNTER — Inpatient Hospital Stay (HOSPITAL_BASED_OUTPATIENT_CLINIC_OR_DEPARTMENT_OTHER): Payer: BC Managed Care – PPO | Admitting: Nurse Practitioner

## 2022-09-10 ENCOUNTER — Encounter: Payer: Self-pay | Admitting: Nurse Practitioner

## 2022-09-10 ENCOUNTER — Other Ambulatory Visit: Payer: Self-pay

## 2022-09-10 ENCOUNTER — Inpatient Hospital Stay: Payer: BC Managed Care – PPO

## 2022-09-10 VITALS — BP 127/79 | HR 95 | Temp 99.5°F | Resp 14 | Wt 231.1 lb

## 2022-09-10 DIAGNOSIS — Z79899 Other long term (current) drug therapy: Secondary | ICD-10-CM | POA: Diagnosis not present

## 2022-09-10 DIAGNOSIS — E785 Hyperlipidemia, unspecified: Secondary | ICD-10-CM | POA: Diagnosis not present

## 2022-09-10 DIAGNOSIS — R200 Anesthesia of skin: Secondary | ICD-10-CM | POA: Diagnosis not present

## 2022-09-10 DIAGNOSIS — Z7982 Long term (current) use of aspirin: Secondary | ICD-10-CM | POA: Diagnosis not present

## 2022-09-10 DIAGNOSIS — R350 Frequency of micturition: Secondary | ICD-10-CM | POA: Diagnosis not present

## 2022-09-10 DIAGNOSIS — G893 Neoplasm related pain (acute) (chronic): Secondary | ICD-10-CM | POA: Diagnosis not present

## 2022-09-10 DIAGNOSIS — I10 Essential (primary) hypertension: Secondary | ICD-10-CM | POA: Diagnosis not present

## 2022-09-10 DIAGNOSIS — K5900 Constipation, unspecified: Secondary | ICD-10-CM

## 2022-09-10 DIAGNOSIS — Z7961 Long term (current) use of immunomodulator: Secondary | ICD-10-CM | POA: Diagnosis not present

## 2022-09-10 DIAGNOSIS — C9000 Multiple myeloma not having achieved remission: Secondary | ICD-10-CM

## 2022-09-10 DIAGNOSIS — Z87891 Personal history of nicotine dependence: Secondary | ICD-10-CM | POA: Diagnosis not present

## 2022-09-10 DIAGNOSIS — R110 Nausea: Secondary | ICD-10-CM | POA: Diagnosis not present

## 2022-09-10 DIAGNOSIS — Z515 Encounter for palliative care: Secondary | ICD-10-CM

## 2022-09-10 DIAGNOSIS — Z800 Family history of malignant neoplasm of digestive organs: Secondary | ICD-10-CM | POA: Diagnosis not present

## 2022-09-10 DIAGNOSIS — G2581 Restless legs syndrome: Secondary | ICD-10-CM | POA: Diagnosis not present

## 2022-09-10 DIAGNOSIS — K219 Gastro-esophageal reflux disease without esophagitis: Secondary | ICD-10-CM | POA: Diagnosis not present

## 2022-09-10 DIAGNOSIS — Z5112 Encounter for antineoplastic immunotherapy: Secondary | ICD-10-CM | POA: Diagnosis not present

## 2022-09-10 LAB — CBC WITH DIFFERENTIAL (CANCER CENTER ONLY)
Abs Immature Granulocytes: 0.04 10*3/uL (ref 0.00–0.07)
Basophils Absolute: 0 10*3/uL (ref 0.0–0.1)
Basophils Relative: 0 %
Eosinophils Absolute: 0 10*3/uL (ref 0.0–0.5)
Eosinophils Relative: 0 %
HCT: 38.4 % — ABNORMAL LOW (ref 39.0–52.0)
Hemoglobin: 12.4 g/dL — ABNORMAL LOW (ref 13.0–17.0)
Immature Granulocytes: 1 %
Lymphocytes Relative: 32 %
Lymphs Abs: 2.4 10*3/uL (ref 0.7–4.0)
MCH: 28.6 pg (ref 26.0–34.0)
MCHC: 32.3 g/dL (ref 30.0–36.0)
MCV: 88.5 fL (ref 80.0–100.0)
Monocytes Absolute: 0.6 10*3/uL (ref 0.1–1.0)
Monocytes Relative: 8 %
Neutro Abs: 4.3 10*3/uL (ref 1.7–7.7)
Neutrophils Relative %: 59 %
Platelet Count: 229 10*3/uL (ref 150–400)
RBC: 4.34 MIL/uL (ref 4.22–5.81)
RDW: 14.3 % (ref 11.5–15.5)
WBC Count: 7.4 10*3/uL (ref 4.0–10.5)
nRBC: 0 % (ref 0.0–0.2)

## 2022-09-10 LAB — CMP (CANCER CENTER ONLY)
ALT: 15 U/L (ref 0–44)
AST: 15 U/L (ref 15–41)
Albumin: 4.2 g/dL (ref 3.5–5.0)
Alkaline Phosphatase: 64 U/L (ref 38–126)
Anion gap: 6 (ref 5–15)
BUN: 21 mg/dL — ABNORMAL HIGH (ref 6–20)
CO2: 28 mmol/L (ref 22–32)
Calcium: 9.4 mg/dL (ref 8.9–10.3)
Chloride: 101 mmol/L (ref 98–111)
Creatinine: 1.08 mg/dL (ref 0.61–1.24)
GFR, Estimated: >60 mL/min (ref >60)
Glucose, Bld: 110 mg/dL — ABNORMAL HIGH (ref 70–99)
Potassium: 4 mmol/L (ref 3.5–5.1)
Sodium: 135 mmol/L (ref 135–145)
Total Bilirubin: 0.5 mg/dL (ref 0.3–1.2)
Total Protein: 7.4 g/dL (ref 6.5–8.1)

## 2022-09-10 MED ORDER — DENOSUMAB 120 MG/1.7ML ~~LOC~~ SOLN
120.0000 mg | Freq: Once | SUBCUTANEOUS | Status: AC
  Administered 2022-09-10: 120 mg via SUBCUTANEOUS
  Filled 2022-09-10: qty 1.7

## 2022-09-10 MED ORDER — BORTEZOMIB CHEMO SQ INJECTION 3.5 MG (2.5MG/ML)
1.3000 mg/m2 | Freq: Once | INTRAMUSCULAR | Status: AC
  Administered 2022-09-10: 3 mg via SUBCUTANEOUS
  Filled 2022-09-10: qty 1.2

## 2022-09-10 MED ORDER — DEXAMETHASONE 4 MG PO TABS
40.0000 mg | ORAL_TABLET | Freq: Once | ORAL | Status: AC
  Administered 2022-09-10: 40 mg via ORAL
  Filled 2022-09-10: qty 10

## 2022-09-10 NOTE — Patient Instructions (Signed)
[facility]  Discharge Instructions: Thank you for choosing [facility] to provide your oncology and hematology care.   If you have a lab appointment with the Cancer Center, please go directly to the Cancer Center and check in at the registration area.   Wear comfortable clothing and clothing appropriate for easy access to any Portacath or PICC line.   We strive to give you quality time with your provider. You may need to reschedule your appointment if you arrive late (15 or more minutes).  Arriving late affects you and other patients whose appointments are after yours.  Also, if you miss three or more appointments without notifying the office, you may be dismissed from the clinic at the provider's discretion.      For prescription refill requests, have your pharmacy contact our office and allow 72 hours for refills to be completed.    Today you received the following chemotherapy and/or immunotherapy agents: Velcade     To help prevent nausea and vomiting after your treatment, we encourage you to take your nausea medication as directed.  BELOW ARE SYMPTOMS THAT SHOULD BE REPORTED IMMEDIATELY: *FEVER GREATER THAN 100.4 F (38 C) OR HIGHER *CHILLS OR SWEATING *NAUSEA AND VOMITING THAT IS NOT CONTROLLED WITH YOUR NAUSEA MEDICATION *UNUSUAL SHORTNESS OF BREATH *UNUSUAL BRUISING OR BLEEDING *URINARY PROBLEMS (pain or burning when urinating, or frequent urination) *BOWEL PROBLEMS (unusual diarrhea, constipation, pain near the anus) TENDERNESS IN MOUTH AND THROAT WITH OR WITHOUT PRESENCE OF ULCERS (sore throat, sores in mouth, or a toothache) UNUSUAL RASH, SWELLING OR PAIN  UNUSUAL VAGINAL DISCHARGE OR ITCHING   Items with * indicate a potential emergency and should be followed up as soon as possible or go to the Emergency Department if any problems should occur.  Please show the CHEMOTHERAPY ALERT CARD or IMMUNOTHERAPY ALERT CARD at check-in  to the Emergency Department and triage nurse.  Should you have questions after your visit or need to cancel or reschedule your appointment, please contact [facility]  Dept: [phone]  and follow the prompts.  Office hours are 8:00 a.m. to 4:30 p.m. Monday - Friday. Please note that voicemails left after 4:00 p.m. may not be returned until the following business day.  We are closed weekends and major holidays. You have access to a nurse at all times for urgent questions. Please call the main number to the clinic Dept: [phone] and follow the prompts.   For any non-urgent questions, you may also contact your provider using MyChart. We now offer e-Visits for anyone 18 and older to request care online for non-urgent symptoms. For details visit [email or web address].   Also download the MyChart app! Go to the app store, search "MyChart", open the app, select [facility], and log in with your MyChart username and password.   

## 2022-09-11 ENCOUNTER — Other Ambulatory Visit: Payer: Self-pay | Admitting: *Deleted

## 2022-09-11 DIAGNOSIS — C9000 Multiple myeloma not having achieved remission: Secondary | ICD-10-CM

## 2022-09-11 MED ORDER — REVLIMID 25 MG PO CAPS
25.0000 mg | ORAL_CAPSULE | Freq: Every day | ORAL | 0 refills | Status: DC
Start: 2022-09-11 — End: 2022-10-06

## 2022-09-15 ENCOUNTER — Encounter: Payer: Self-pay | Admitting: Hematology and Oncology

## 2022-09-15 ENCOUNTER — Telehealth: Payer: Self-pay | Admitting: Physician Assistant

## 2022-09-15 NOTE — Telephone Encounter (Signed)
Reached out to patient to schedule per WQ, left voicemail. 

## 2022-09-17 ENCOUNTER — Inpatient Hospital Stay: Payer: BC Managed Care – PPO

## 2022-09-17 ENCOUNTER — Other Ambulatory Visit: Payer: Self-pay

## 2022-09-17 VITALS — BP 139/85 | HR 95 | Temp 98.3°F | Resp 14 | Wt 233.5 lb

## 2022-09-17 DIAGNOSIS — R110 Nausea: Secondary | ICD-10-CM | POA: Diagnosis not present

## 2022-09-17 DIAGNOSIS — Z7961 Long term (current) use of immunomodulator: Secondary | ICD-10-CM | POA: Diagnosis not present

## 2022-09-17 DIAGNOSIS — Z800 Family history of malignant neoplasm of digestive organs: Secondary | ICD-10-CM | POA: Diagnosis not present

## 2022-09-17 DIAGNOSIS — Z79899 Other long term (current) drug therapy: Secondary | ICD-10-CM | POA: Diagnosis not present

## 2022-09-17 DIAGNOSIS — E785 Hyperlipidemia, unspecified: Secondary | ICD-10-CM | POA: Diagnosis not present

## 2022-09-17 DIAGNOSIS — G2581 Restless legs syndrome: Secondary | ICD-10-CM | POA: Diagnosis not present

## 2022-09-17 DIAGNOSIS — C9000 Multiple myeloma not having achieved remission: Secondary | ICD-10-CM | POA: Diagnosis not present

## 2022-09-17 DIAGNOSIS — R350 Frequency of micturition: Secondary | ICD-10-CM | POA: Diagnosis not present

## 2022-09-17 DIAGNOSIS — R200 Anesthesia of skin: Secondary | ICD-10-CM | POA: Diagnosis not present

## 2022-09-17 DIAGNOSIS — Z7982 Long term (current) use of aspirin: Secondary | ICD-10-CM | POA: Diagnosis not present

## 2022-09-17 DIAGNOSIS — K5900 Constipation, unspecified: Secondary | ICD-10-CM | POA: Diagnosis not present

## 2022-09-17 DIAGNOSIS — I10 Essential (primary) hypertension: Secondary | ICD-10-CM | POA: Diagnosis not present

## 2022-09-17 DIAGNOSIS — Z5112 Encounter for antineoplastic immunotherapy: Secondary | ICD-10-CM | POA: Diagnosis not present

## 2022-09-17 DIAGNOSIS — K219 Gastro-esophageal reflux disease without esophagitis: Secondary | ICD-10-CM | POA: Diagnosis not present

## 2022-09-17 DIAGNOSIS — Z87891 Personal history of nicotine dependence: Secondary | ICD-10-CM | POA: Diagnosis not present

## 2022-09-17 LAB — CMP (CANCER CENTER ONLY)
ALT: 19 U/L (ref 0–44)
AST: 17 U/L (ref 15–41)
Albumin: 4.3 g/dL (ref 3.5–5.0)
Alkaline Phosphatase: 55 U/L (ref 38–126)
Anion gap: 6 (ref 5–15)
BUN: 17 mg/dL (ref 6–20)
CO2: 30 mmol/L (ref 22–32)
Calcium: 9.7 mg/dL (ref 8.9–10.3)
Chloride: 98 mmol/L (ref 98–111)
Creatinine: 0.9 mg/dL (ref 0.61–1.24)
GFR, Estimated: >60 mL/min (ref >60)
Glucose, Bld: 114 mg/dL — ABNORMAL HIGH (ref 70–99)
Potassium: 4.2 mmol/L (ref 3.5–5.1)
Sodium: 134 mmol/L — ABNORMAL LOW (ref 135–145)
Total Bilirubin: 0.6 mg/dL (ref 0.3–1.2)
Total Protein: 7.3 g/dL (ref 6.5–8.1)

## 2022-09-17 LAB — CBC WITH DIFFERENTIAL (CANCER CENTER ONLY)
Abs Immature Granulocytes: 0.12 10*3/uL — ABNORMAL HIGH (ref 0.00–0.07)
Basophils Absolute: 0 10*3/uL (ref 0.0–0.1)
Basophils Relative: 0 %
Eosinophils Absolute: 0.1 10*3/uL (ref 0.0–0.5)
Eosinophils Relative: 1 %
HCT: 40 % (ref 39.0–52.0)
Hemoglobin: 13 g/dL (ref 13.0–17.0)
Immature Granulocytes: 2 %
Lymphocytes Relative: 29 %
Lymphs Abs: 1.6 10*3/uL (ref 0.7–4.0)
MCH: 28.6 pg (ref 26.0–34.0)
MCHC: 32.5 g/dL (ref 30.0–36.0)
MCV: 88.1 fL (ref 80.0–100.0)
Monocytes Absolute: 0.4 10*3/uL (ref 0.1–1.0)
Monocytes Relative: 7 %
Neutro Abs: 3.4 10*3/uL (ref 1.7–7.7)
Neutrophils Relative %: 61 %
Platelet Count: 225 10*3/uL (ref 150–400)
RBC: 4.54 MIL/uL (ref 4.22–5.81)
RDW: 13.9 % (ref 11.5–15.5)
WBC Count: 5.6 10*3/uL (ref 4.0–10.5)
nRBC: 0 % (ref 0.0–0.2)

## 2022-09-17 MED ORDER — BORTEZOMIB CHEMO SQ INJECTION 3.5 MG (2.5MG/ML)
1.3000 mg/m2 | Freq: Once | INTRAMUSCULAR | Status: AC
  Administered 2022-09-17: 3 mg via SUBCUTANEOUS
  Filled 2022-09-17: qty 1.2

## 2022-09-17 MED ORDER — DEXAMETHASONE 4 MG PO TABS
40.0000 mg | ORAL_TABLET | Freq: Once | ORAL | Status: AC
  Administered 2022-09-17: 40 mg via ORAL
  Filled 2022-09-17: qty 10

## 2022-09-17 NOTE — Patient Instructions (Signed)
[facility]  Discharge Instructions: Thank you for choosing [facility] to provide your oncology and hematology care.   If you have a lab appointment with the Cancer Center, please go directly to the Cancer Center and check in at the registration area.   Wear comfortable clothing and clothing appropriate for easy access to any Portacath or PICC line.   We strive to give you quality time with your provider. You may need to reschedule your appointment if you arrive late (15 or more minutes).  Arriving late affects you and other patients whose appointments are after yours.  Also, if you miss three or more appointments without notifying the office, you may be dismissed from the clinic at the provider's discretion.      For prescription refill requests, have your pharmacy contact our office and allow 72 hours for refills to be completed.    Today you received the following chemotherapy and/or immunotherapy agents: Velcade     To help prevent nausea and vomiting after your treatment, we encourage you to take your nausea medication as directed.  BELOW ARE SYMPTOMS THAT SHOULD BE REPORTED IMMEDIATELY: *FEVER GREATER THAN 100.4 F (38 C) OR HIGHER *CHILLS OR SWEATING *NAUSEA AND VOMITING THAT IS NOT CONTROLLED WITH YOUR NAUSEA MEDICATION *UNUSUAL SHORTNESS OF BREATH *UNUSUAL BRUISING OR BLEEDING *URINARY PROBLEMS (pain or burning when urinating, or frequent urination) *BOWEL PROBLEMS (unusual diarrhea, constipation, pain near the anus) TENDERNESS IN MOUTH AND THROAT WITH OR WITHOUT PRESENCE OF ULCERS (sore throat, sores in mouth, or a toothache) UNUSUAL RASH, SWELLING OR PAIN  UNUSUAL VAGINAL DISCHARGE OR ITCHING   Items with * indicate a potential emergency and should be followed up as soon as possible or go to the Emergency Department if any problems should occur.  Please show the CHEMOTHERAPY ALERT CARD or IMMUNOTHERAPY ALERT CARD at check-in  to the Emergency Department and triage nurse.  Should you have questions after your visit or need to cancel or reschedule your appointment, please contact [facility]  Dept: [phone]  and follow the prompts.  Office hours are 8:00 a.m. to 4:30 p.m. Monday - Friday. Please note that voicemails left after 4:00 p.m. may not be returned until the following business day.  We are closed weekends and major holidays. You have access to a nurse at all times for urgent questions. Please call the main number to the clinic Dept: [phone] and follow the prompts.   For any non-urgent questions, you may also contact your provider using MyChart. We now offer e-Visits for anyone 18 and older to request care online for non-urgent symptoms. For details visit [email or web address].   Also download the MyChart app! Go to the app store, search "MyChart", open the app, select [facility], and log in with your MyChart username and password.   

## 2022-09-18 ENCOUNTER — Other Ambulatory Visit: Payer: Self-pay | Admitting: Nurse Practitioner

## 2022-09-18 DIAGNOSIS — K30 Functional dyspepsia: Secondary | ICD-10-CM

## 2022-09-18 DIAGNOSIS — Z515 Encounter for palliative care: Secondary | ICD-10-CM

## 2022-09-18 DIAGNOSIS — C9000 Multiple myeloma not having achieved remission: Secondary | ICD-10-CM

## 2022-09-18 LAB — KAPPA/LAMBDA LIGHT CHAINS
Kappa free light chain: 19.2 mg/L (ref 3.3–19.4)
Kappa, lambda light chain ratio: 1.96 — ABNORMAL HIGH (ref 0.26–1.65)
Lambda free light chains: 9.8 mg/L (ref 5.7–26.3)

## 2022-09-22 LAB — MULTIPLE MYELOMA PANEL, SERUM
Albumin SerPl Elph-Mcnc: 3.4 g/dL (ref 2.9–4.4)
Albumin/Glob SerPl: 1.1 (ref 0.7–1.7)
Alpha 1: 0.3 g/dL (ref 0.0–0.4)
Alpha2 Glob SerPl Elph-Mcnc: 1 g/dL (ref 0.4–1.0)
B-Globulin SerPl Elph-Mcnc: 1.2 g/dL (ref 0.7–1.3)
Gamma Glob SerPl Elph-Mcnc: 0.8 g/dL (ref 0.4–1.8)
Globulin, Total: 3.3 g/dL (ref 2.2–3.9)
IgA: 53 mg/dL — ABNORMAL LOW (ref 90–386)
IgG (Immunoglobin G), Serum: 882 mg/dL (ref 603–1613)
IgM (Immunoglobulin M), Srm: 48 mg/dL (ref 20–172)
M Protein SerPl Elph-Mcnc: 0.3 g/dL — ABNORMAL HIGH
Total Protein ELP: 6.7 g/dL (ref 6.0–8.5)

## 2022-09-23 ENCOUNTER — Ambulatory Visit (INDEPENDENT_AMBULATORY_CARE_PROVIDER_SITE_OTHER): Payer: BC Managed Care – PPO | Admitting: Family Medicine

## 2022-09-23 ENCOUNTER — Encounter: Payer: Self-pay | Admitting: Family Medicine

## 2022-09-23 VITALS — BP 136/87 | HR 93 | Temp 97.7°F | Resp 16 | Wt 235.4 lb

## 2022-09-23 DIAGNOSIS — I10 Essential (primary) hypertension: Secondary | ICD-10-CM

## 2022-09-23 DIAGNOSIS — K219 Gastro-esophageal reflux disease without esophagitis: Secondary | ICD-10-CM

## 2022-09-23 DIAGNOSIS — F32A Depression, unspecified: Secondary | ICD-10-CM | POA: Diagnosis not present

## 2022-09-23 DIAGNOSIS — F419 Anxiety disorder, unspecified: Secondary | ICD-10-CM | POA: Diagnosis not present

## 2022-09-23 NOTE — Progress Notes (Unsigned)
Palliative Medicine [facility]  [phone] [phone]   Name: [name] Date: 09/23/2022 MRN: [id number]  DOB: 21-Nov-1970  Patient Care Team: Georganna Skeans, MD as PCP - General (Family Medicine) Runell Gess, MD as PCP - Cardiology (Cardiology) Jena Gauss Gerrit Friends, MD as Attending Physician (Gastroenterology)   INTERVAL HISTORY: [name] is a 52 y.o. male with oncologic medical history including IgG Kappa Multiple Myeloma, L1, L2, and L3 vertebral compression fraction s/p kyphoplasty(9/27) and IM nailing of left humeral shaft (9/30), currently undergoing VRD therapy.  Palliative ask to see for symptom management.   SOCIAL HISTORY:     reports that he quit smoking about 8 months ago. His smoking use included cigarettes. He has a 2.50 pack-year smoking history. He has never used smokeless tobacco. He reports current alcohol use. He reports that he does not use drugs.  ADVANCE DIRECTIVES:    CODE STATUS:   PAST MEDICAL HISTORY: Past Medical History:  Diagnosis Date   Allergy    Anemia    Atypical chest pain    Cancer (HCC)    Multiple myeloma with normocytic anemia   GERD (gastroesophageal reflux disease)    Hepatic cyst    Benign by MRI   Hiatal hernia    History of colonic polyps    Hyperlipidemia    Hypertension    Rectal bleeding    Tobacco abuse     ALLERGIES:  has No Known Allergies.  MEDICATIONS:  Current Outpatient Medications  Medication Sig Dispense Refill   acyclovir (ZOVIRAX) 400 MG tablet Take 1 tablet (400 mg total) by mouth 2 (two) times daily. 60 tablet 3   allopurinol (ZYLOPRIM) 300 MG tablet Take 1 tablet (300 mg total) by mouth daily. 30 tablet 3   aspirin EC 81 MG tablet Take 81 mg by mouth daily. Swallow whole.     cetirizine (ZYRTEC) 10 MG tablet Take 10 mg by mouth daily as needed for allergies.     clonazePAM (KLONOPIN) 0.5 MG tablet Take 1 tablet (0.5 mg total) by mouth 2 (two) times  daily as needed for anxiety. 15 tablet 0   docusate sodium (COLACE) 100 MG capsule Take 1 capsule (100 mg total) by mouth 2 (two) times daily as needed for mild constipation. 30 capsule 3   DULoxetine (CYMBALTA) 20 MG capsule Take 1 capsule by mouth once daily 30 capsule 0   gabapentin (NEURONTIN) 100 MG capsule Take 1 capsule by mouth twice daily 60 capsule 2   hydrochlorothiazide (HYDRODIURIL) 25 MG tablet Take 25 mg by mouth daily.     lisinopril (ZESTRIL) 40 MG tablet Take 40 mg by mouth daily.     Methocarbamol 1000 MG TABS Take 1,000 mg by mouth every 6 (six) hours as needed. (Patient taking differently: Take 1,000 mg by mouth every 6 (six) hours as needed (muscle spasms).) 45 tablet 1   morphine (MS CONTIN) 30 MG 12 hr tablet Take 1 tablet by mouth every 8 hours. 60 tablet 0   ondansetron (ZOFRAN) 8 MG tablet Take 1 tablet (8 mg total) by mouth every 8 (eight) hours as needed for nausea or vomiting. 60 tablet 3   pantoprazole (PROTONIX) 40 MG tablet Take 1 tablet by mouth twice daily 60 tablet 0   polyethylene glycol powder (GLYCOLAX/MIRALAX) 17 GM/SCOOP powder Take 1 capful (17 g) with water by mouth daily. (Patient taking differently: Take 17 g by mouth daily as needed for mild constipation.) 238 g  0   prochlorperazine (COMPAZINE) 10 MG tablet Take 1 tablet (10 mg total) by mouth every 6 (six) hours as needed for nausea or vomiting. 60 tablet 0   REVLIMID 25 MG capsule Take 1 capsule (25 mg total) by mouth daily. Take one capsule daily x 14 days. None for following 7 days Auth #  [id number]  Date obtained 09/11/22 14 capsule 0   rosuvastatin (CRESTOR) 10 MG tablet Take 1 tablet (10 mg total) by mouth daily. 90 tablet 1   No current facility-administered medications for this visit.    VITAL SIGNS: There were no vitals taken for this visit. There were no vitals filed for this visit.  Estimated body mass index is 34.76 kg/m as calculated from the following:   Height as of 09/03/22: 5\' 9"   (1.753 m).   Weight as of 09/23/22: 235 lb 6.4 oz (106.8 kg).   PERFORMANCE STATUS (ECOG) : 1 - Symptomatic but completely ambulatory  Assessment NAD, ambulatory RRR Normal breathing pattern AAO x4  IMPRESSION:   Neoplasm related pain [name] reports pain continues to be well-controlled.    MS Contin 30 mg twice daily.  Continues with Gabapentin 100 mg twice daily, Cymbalta 20 mg, and robaxin as needed. Given controlled pain no changes at this time.  Will continue to closely monitor.  Nausea/Indigestion Resolved  Constipation Resolved with current regimen  Overall [name] is much improved.  Appreciative of where he is in his health journey.  He knows to contact our office as needed with any concerns.  PLAN:  MS Contin 30 mg every 12 hours Oxycodone 20 mg (no longer requiring) will discontinue. Gabapentin to 100 mg twice daily Cymbalta 20 mg daily  Zofran and Compazine as needed for nausea Robaxin as needed for muscle spasms Miralax daily for constipation I will plan to see patient back in 4-6 weeks in collaboration with other oncology appointments.  Patient knows to contact office sooner if needed.   Patient expressed understanding and was in agreement with this plan. He also understands that He can call the clinic at any time with any questions, concerns, or complaints.    Any controlled substances utilized were prescribed in the context of palliative care. PDMP has been reviewed.    Visit consisted of counseling and education dealing with the complex and emotionally intense issues of symptom management and palliative care in the setting of serious and potentially life-threatening illness.Greater than 50%  of this time was spent counseling and coordinating care related to the above assessment and plan.  Willette Alma, AGPCNP-BC  Palliative Medicine Team/[facility]  *Please note that this is a verbal dictation therefore any spelling or grammatical  errors are due to the "[employer]agon [employer] One" system interpretation.

## 2022-09-23 NOTE — Progress Notes (Unsigned)
Patient is here for their 3 month follow-up Patient has no concerns today Care gaps have been discussed with patient  

## 2022-09-24 ENCOUNTER — Inpatient Hospital Stay: Payer: BC Managed Care – PPO

## 2022-09-24 ENCOUNTER — Inpatient Hospital Stay: Payer: BC Managed Care – PPO | Attending: Physician Assistant

## 2022-09-24 ENCOUNTER — Other Ambulatory Visit: Payer: Self-pay

## 2022-09-24 ENCOUNTER — Inpatient Hospital Stay: Payer: BC Managed Care – PPO | Admitting: Nurse Practitioner

## 2022-09-24 ENCOUNTER — Inpatient Hospital Stay: Payer: BC Managed Care – PPO | Admitting: Physician Assistant

## 2022-09-24 VITALS — BP 123/84 | HR 86 | Temp 98.2°F | Resp 14 | Ht 69.0 in | Wt 233.6 lb

## 2022-09-24 DIAGNOSIS — Z5112 Encounter for antineoplastic immunotherapy: Secondary | ICD-10-CM | POA: Diagnosis not present

## 2022-09-24 DIAGNOSIS — G2581 Restless legs syndrome: Secondary | ICD-10-CM | POA: Diagnosis not present

## 2022-09-24 DIAGNOSIS — K5900 Constipation, unspecified: Secondary | ICD-10-CM | POA: Diagnosis not present

## 2022-09-24 DIAGNOSIS — R5383 Other fatigue: Secondary | ICD-10-CM | POA: Diagnosis not present

## 2022-09-24 DIAGNOSIS — C9000 Multiple myeloma not having achieved remission: Secondary | ICD-10-CM

## 2022-09-24 DIAGNOSIS — Z8719 Personal history of other diseases of the digestive system: Secondary | ICD-10-CM | POA: Diagnosis not present

## 2022-09-24 DIAGNOSIS — Z87891 Personal history of nicotine dependence: Secondary | ICD-10-CM | POA: Insufficient documentation

## 2022-09-24 DIAGNOSIS — Z7961 Long term (current) use of immunomodulator: Secondary | ICD-10-CM | POA: Diagnosis not present

## 2022-09-24 DIAGNOSIS — E785 Hyperlipidemia, unspecified: Secondary | ICD-10-CM | POA: Diagnosis not present

## 2022-09-24 DIAGNOSIS — I10 Essential (primary) hypertension: Secondary | ICD-10-CM | POA: Insufficient documentation

## 2022-09-24 DIAGNOSIS — Z7982 Long term (current) use of aspirin: Secondary | ICD-10-CM | POA: Insufficient documentation

## 2022-09-24 DIAGNOSIS — H00011 Hordeolum externum right upper eyelid: Secondary | ICD-10-CM

## 2022-09-24 DIAGNOSIS — Z79899 Other long term (current) drug therapy: Secondary | ICD-10-CM | POA: Diagnosis not present

## 2022-09-24 DIAGNOSIS — K219 Gastro-esophageal reflux disease without esophagitis: Secondary | ICD-10-CM | POA: Diagnosis not present

## 2022-09-24 LAB — CBC WITH DIFFERENTIAL (CANCER CENTER ONLY)
Abs Immature Granulocytes: 0.06 10*3/uL (ref 0.00–0.07)
Basophils Absolute: 0 10*3/uL (ref 0.0–0.1)
Basophils Relative: 0 %
Eosinophils Absolute: 0 10*3/uL (ref 0.0–0.5)
Eosinophils Relative: 1 %
HCT: 38.8 % — ABNORMAL LOW (ref 39.0–52.0)
Hemoglobin: 12.6 g/dL — ABNORMAL LOW (ref 13.0–17.0)
Immature Granulocytes: 1 %
Lymphocytes Relative: 28 %
Lymphs Abs: 1.4 10*3/uL (ref 0.7–4.0)
MCH: 28.7 pg (ref 26.0–34.0)
MCHC: 32.5 g/dL (ref 30.0–36.0)
MCV: 88.4 fL (ref 80.0–100.0)
Monocytes Absolute: 0.5 10*3/uL (ref 0.1–1.0)
Monocytes Relative: 11 %
Neutro Abs: 3.1 10*3/uL (ref 1.7–7.7)
Neutrophils Relative %: 59 %
Platelet Count: 186 10*3/uL (ref 150–400)
RBC: 4.39 MIL/uL (ref 4.22–5.81)
RDW: 13.6 % (ref 11.5–15.5)
WBC Count: 5.2 10*3/uL (ref 4.0–10.5)
nRBC: 0 % (ref 0.0–0.2)

## 2022-09-24 LAB — CMP (CANCER CENTER ONLY)
ALT: 22 U/L (ref 0–44)
AST: 18 U/L (ref 15–41)
Albumin: 4.1 g/dL (ref 3.5–5.0)
Alkaline Phosphatase: 56 U/L (ref 38–126)
Anion gap: 5 (ref 5–15)
BUN: 12 mg/dL (ref 6–20)
CO2: 28 mmol/L (ref 22–32)
Calcium: 8.4 mg/dL — ABNORMAL LOW (ref 8.9–10.3)
Chloride: 101 mmol/L (ref 98–111)
Creatinine: 0.84 mg/dL (ref 0.61–1.24)
GFR, Estimated: >60 mL/min (ref >60)
Glucose, Bld: 107 mg/dL — ABNORMAL HIGH (ref 70–99)
Potassium: 4 mmol/L (ref 3.5–5.1)
Sodium: 134 mmol/L — ABNORMAL LOW (ref 135–145)
Total Bilirubin: 0.6 mg/dL (ref 0.3–1.2)
Total Protein: 6.8 g/dL (ref 6.5–8.1)

## 2022-09-24 MED ORDER — DOXYCYCLINE HYCLATE 100 MG PO TABS: 100.0000 mg | ORAL_TABLET | Freq: Two times a day (BID) | ORAL | 0 refills | Status: DC

## 2022-09-24 NOTE — Progress Notes (Signed)
[facility] [phone]   [phone]  PROGRESS NOTE  Patient Care Team: Georganna Skeans, MD as PCP - General (Family Medicine) Runell Gess, MD as PCP - Cardiology (Cardiology) Jena Gauss Gerrit Friends, MD as Attending Physician (Gastroenterology)  Hematological/Oncological History # IgG Kappa Multiple Myeloma, 1p32/1q21.  01/04/2022: MRI lumbar spine shows diffuse heterogeneous marrow signal with heterogeneous enhancement throughout the thoracolumbar spine.  Additionally, there is subacute-chronic L3 vertebral body compression fraction. 01/14/2022: establish care with Dr. Leonides Schanz  02/10/2022: bone marrow biopsy shows range of plasma cell involvement from 50% on the biopsy to 60% on aspirate smear slides and close to 90% on the clot section for an overall percentage estimated at approximately 80% overall. 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  02/26/2022: Cycle 1 of VRD therapy 04/01/2022: Cycle 2 of VRD therapy  04/23/2022: Cycle 3 of VRD therapy  05/14/2022: Cycle 4 of VRD therapy  06/04/2022: Underwent kyphoplasty so treatment was cancelled 06/11/2022: Resume Cycle 5 Day 8 of VRD therapy 06/25/2022: Cycle 6 Day 1 of VRD 07/16/2022: Cycle 7 Day 1 of VRD 08/06/2022: Cycle 8 Day 1 of VRD 08/26/2022: Cycle 9 Day 1 of VRD 09/17/2022: Cycle 10 Day 1 of VRD  Interval History:  [name] 52 y.o. male with medical history significant for newly diagnosed IgG kappa multiple myeloma who presents for a follow up visit. The patient's last visit was on 09/03/2022 at which time he continued on VRD therapy.    On exam today [name] reports his energy and appetite are overall stable. He noticed a stye in his right eye the last couple of days which has caused some swellling. His vision is intact and he denies any discharge from the stye. He reports that he had a stye in the past which was milder and resolved on its own. He is  otherwise feeling well. He denies nausea, vomiting or abdominal pain. His bowel habits are unchanged without recurrent episodes of diarrhea or constipation. He denies easy bruising or signs of bleeding.   He denies fevers, chills, sweats, shortness of breath, chest pain or cough. He has no other complaints. A full 10 point ROS was otherwise negative.  MEDICAL HISTORY:  Past Medical History:  Diagnosis Date   Allergy    Anemia    Atypical chest pain    Cancer (HCC)    Multiple myeloma with normocytic anemia   GERD (gastroesophageal reflux disease)    Hepatic cyst    Benign by MRI   Hiatal hernia    History of colonic polyps    Hyperlipidemia    Hypertension    Rectal bleeding    Tobacco abuse     SURGICAL HISTORY: Past Surgical History:  Procedure Laterality Date   COLONOSCOPY  01/10/2009     RMR: Normal rectum, normal colon; repeat in 2015 due to FH of colon cancer   COLONOSCOPY N/A 01/09/2014   [id number] colonic polyps-removed as described   COLONOSCOPY N/A 09/18/2016   Procedure: COLONOSCOPY;  Surgeon: Corbin Ade, MD;  [facility]: [facility];  Service: Endoscopy;  Laterality: N/A;  730    ESOPHAGOGASTRODUODENOSCOPY  10/04/2007   RMR: Distal esophageal erosions consistent with erosive reflux esophagitis, patulous gastroesophageal junction status post passage of a  Maloney dilator, 56 [place].  Otherwise, unremarkable esophagus.  Hiatal hernia.  Otherwise normal stomach.  Bulbar erosion   ESOPHAGOGASTRODUODENOSCOPY N/A 01/09/2014   Erosive reflux esophagitis. Small hiatal hernia   HUMERUS IM NAIL Left  02/22/2022   Procedure: INTRAMEDULLARY (IM) NAIL HUMERAL;  Surgeon: Yolonda Kida, MD;  Location: [facility] OR;  Service: Orthopedics;  Laterality: Left;   I & D EXTREMITY Left 07/26/2018   Procedure: IRRIGATION AND DEBRIDEMENT EXTREMITY;  Surgeon: Knute Neu, MD;  Location: [facility] OR;  Service: Plastics;  Laterality: Left;   IR BONE TUMOR(S)RF ABLATION  02/05/2022   IR BONE  TUMOR(S)RF ABLATION  02/24/2022   IR KYPHO EA ADDL LEVEL THORACIC OR LUMBAR  02/19/2022   IR KYPHO LUMBAR INC FX REDUCE BONE BX UNI/BIL CANNULATION INC/IMAGING  02/05/2022   IR KYPHO LUMBAR INC FX REDUCE BONE BX UNI/BIL CANNULATION INC/IMAGING  02/19/2022   PERCUTANEOUS PINNING Left 07/26/2018   Procedure: PERCUTANEOUS PINNING EXTREMITY;  Surgeon: Knute Neu, MD;  Location: [facility] OR;  Service: Plastics;  Laterality: Left;    SOCIAL HISTORY: Social History   Socioeconomic History   Marital status: Divorced    Spouse name: Not on file   Number of children: 2   Years of education: Not on file   Highest education level: Not on file  Occupational History   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[employer]    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[employer]  Tobacco Use   Smoking status: Former    Packs/day: 0.25    Years: 10.00    Additional pack years: 0.00    Total pack years: 2.50    Types: Cigarettes    Quit date: 12/24/2021    Years since quitting: 0.7   Smokeless tobacco: Never  Vaping Use   Vaping Use: Never used  Substance and Sexual Activity   Alcohol use: Yes    Alcohol/week: 0.0 standard drinks of alcohol    Comment: occasional/wine on the weekends   Drug use: No   Sexual activity: Not on file  Other Topics Concern   Not on file  Social History Narrative   Right handed   Lives alone one story home   Social Determinants of Health   Financial Resource Strain: Not on file  Food Insecurity: No Food Insecurity (07/17/2022)   Hunger Vital Sign    Worried About Running Out of Food in the Last Year: Never true    Ran Out of Food in the Last Year: Never true  Transportation Needs: No Transportation Needs (07/17/2022)   PRAPARE - Transportation    Lack of Transportation (Medical): No    Lack of Transportation (Non-Medical): No  Physical Activity: Not on file  Stress: Not on file  Social Connections: Not on file  Intimate Partner Violence: Not At Risk (07/17/2022)    Humiliation, Afraid, Rape, and Kick questionnaire    Fear of Current or Ex-Partner: No    Emotionally Abused: No    Physically Abused: No    Sexually Abused: No    FAMILY HISTORY: Family History  Problem Relation Age of Onset   Heart disease Mother    Hypertension Mother    Diabetes Father    Hypertension Father    Colon cancer Sister        passed away from colon cancer, in her 27s   Heart attack Maternal Uncle    Prostate cancer Maternal Uncle    Diabetes Other    Pancreatic cancer Neg Hx    Rectal cancer Neg Hx    Esophageal cancer Neg Hx    Stomach cancer Neg Hx     ALLERGIES:  has No Known Allergies.  MEDICATIONS:  Current Outpatient Medications  Medication Sig Dispense Refill  acyclovir (ZOVIRAX) 400 MG tablet Take 1 tablet (400 mg total) by mouth 2 (two) times daily. 60 tablet 3   allopurinol (ZYLOPRIM) 300 MG tablet Take 1 tablet (300 mg total) by mouth daily. 30 tablet 3   aspirin EC 81 MG tablet Take 81 mg by mouth daily. Swallow whole.     cetirizine (ZYRTEC) 10 MG tablet Take 10 mg by mouth daily as needed for allergies.     clonazePAM (KLONOPIN) 0.5 MG tablet Take 1 tablet (0.5 mg total) by mouth 2 (two) times daily as needed for anxiety. 15 tablet 0   docusate sodium (COLACE) 100 MG capsule Take 1 capsule (100 mg total) by mouth 2 (two) times daily as needed for mild constipation. 30 capsule 3   DULoxetine (CYMBALTA) 20 MG capsule Take 1 capsule by mouth once daily 30 capsule 0   gabapentin (NEURONTIN) 100 MG capsule Take 1 capsule by mouth twice daily 60 capsule 2   hydrochlorothiazide (HYDRODIURIL) 25 MG tablet Take 25 mg by mouth daily.     lisinopril (ZESTRIL) 40 MG tablet Take 40 mg by mouth daily.     Methocarbamol 1000 MG TABS Take 1,000 mg by mouth every 6 (six) hours as needed. (Patient taking differently: Take 1,000 mg by mouth every 6 (six) hours as needed (muscle spasms).) 45 tablet 1   morphine (MS CONTIN) 30 MG 12 hr tablet Take 1 tablet by mouth  every 8 hours. 60 tablet 0   ondansetron (ZOFRAN) 8 MG tablet Take 1 tablet (8 mg total) by mouth every 8 (eight) hours as needed for nausea or vomiting. 60 tablet 3   pantoprazole (PROTONIX) 40 MG tablet Take 1 tablet by mouth twice daily 60 tablet 0   polyethylene glycol powder (GLYCOLAX/MIRALAX) 17 GM/SCOOP powder Take 1 capful (17 g) with water by mouth daily. (Patient taking differently: Take 17 g by mouth daily as needed for mild constipation.) 238 g 0   prochlorperazine (COMPAZINE) 10 MG tablet Take 1 tablet (10 mg total) by mouth every 6 (six) hours as needed for nausea or vomiting. 60 tablet 0   REVLIMID 25 MG capsule Take 1 capsule (25 mg total) by mouth daily. Take one capsule daily x 14 days. None for following 7 days Auth #  [id number]  Date obtained 09/11/22 14 capsule 0   rosuvastatin (CRESTOR) 10 MG tablet Take 1 tablet (10 mg total) by mouth daily. 90 tablet 1   No current facility-administered medications for this visit.    REVIEW OF SYSTEMS:   Constitutional: ( - ) fevers, ( - )  chills , ( - ) night sweats Eyes: ( - ) blurriness of vision, ( - ) double vision, ( - ) watery eyes Ears, nose, mouth, throat, and face: ( - ) mucositis, ( - ) sore throat Respiratory: ( - ) cough, ( - ) dyspnea, ( - ) wheezes Cardiovascular: ( - ) palpitation, ( - ) chest discomfort, ( - ) lower extremity swelling Gastrointestinal:  ( - ) nausea, ( - ) heartburn, ( - ) change in bowel habits Skin: ( - ) abnormal skin rashes Lymphatics: ( - ) new lymphadenopathy, ( - ) easy bruising Neurological: ( - ) numbness, ( - ) tingling, ( - ) new weaknesses Behavioral/Psych: ( - ) mood change, ( - ) new changes  All other systems were reviewed with the patient and are negative.  PHYSICAL EXAMINATION: ECOG PERFORMANCE STATUS: 1 - Symptomatic but completely ambulatory  Vitals:   09/24/22  1330  BP: 123/84  Pulse: 86  Resp: 14  Temp: 98.2 F (36.8 C)  SpO2: 99%    Filed Weights   09/24/22 1330   Weight: 233 lb 9.6 oz (106 kg)     GENERAL: Well-appearing young African-American male, alert, no distress and comfortable SKIN: skin color, texture, turgor are normal, no rashes or significant lesions EYES: conjunctiva are pink and non-injected, sclera clear. Swelling in right eyelid with surround erythema. No purulent discharge noted.  LUNGS: clear to auscultation and percussion with normal breathing effort HEART: regular rate & rhythm and no murmurs and no lower extremity edema Musculoskeletal: no cyanosis of digits and no clubbing  PSYCH: alert & oriented x 3, fluent speech NEURO: no focal motor/sensory deficits  LABORATORY DATA:  I have reviewed the data as listed    Latest Ref Rng & Units 09/24/2022   12:24 PM 09/17/2022   12:25 PM 09/10/2022   12:03 PM  CBC  WBC 4.0 - 10.5 K/uL 5.2  5.6  7.4   Hemoglobin 13.0 - 17.0 g/dL 40.9  81.1  91.4   Hematocrit 39.0 - 52.0 % 38.8  40.0  38.4   Platelets 150 - 400 K/uL 186  225  229        Latest Ref Rng & Units 09/24/2022   12:24 PM 09/17/2022   12:25 PM 09/10/2022   12:03 PM  CMP  Glucose 70 - 99 mg/dL 782  956  213   BUN 6 - 20 mg/dL 12  17  21    Creatinine 0.61 - 1.24 mg/dL 0.86  5.78  4.69   Sodium 135 - 145 mmol/L 134  134  135   Potassium 3.5 - 5.1 mmol/L 4.0  4.2  4.0   Chloride 98 - 111 mmol/L 101  98  101   CO2 22 - 32 mmol/L 28  30  28    Calcium 8.9 - 10.3 mg/dL 8.4  9.7  9.4   Total Protein 6.5 - 8.1 g/dL 6.8  7.3  7.4   Total Bilirubin 0.3 - 1.2 mg/dL 0.6  0.6  0.5   Alkaline Phos 38 - 126 U/L 56  55  64   AST 15 - 41 U/L 18  17  15    ALT 0 - 44 U/L 22  19  15      Lab Results  Component Value Date   MPROTEIN 0.3 (H) 09/17/2022   MPROTEIN 0.3 (H) 08/26/2022   MPROTEIN 0.5 (H) 08/06/2022   Lab Results  Component Value Date   KPAFRELGTCHN 19.2 09/17/2022   KPAFRELGTCHN 17.1 08/26/2022   KPAFRELGTCHN 17.5 08/06/2022   LAMBDASER 9.8 09/17/2022   LAMBDASER 9.2 08/26/2022   LAMBDASER 9.1 08/06/2022    KAPLAMBRATIO 1.96 (H) 09/17/2022   KAPLAMBRATIO 1.86 (H) 08/26/2022   KAPLAMBRATIO 1.92 (H) 08/06/2022   RADIOGRAPHIC STUDIES: No results found.  ASSESSMENT & PLAN [name] 52 y.o. male with medical history significant for IgG kappa multiple myeloma who presents for a follow up visit.  After review of the labs, review the records, discussion with the patient the findings are most consistent with newly diagnosed multiple myeloma.  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  At this time would recommend proceeding with VRD chemotherapy with the intention of referral for ASCT when his labs are appropriate.  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  -- Diagnosis confirmed with lytic lesions of the spine as well as biopsy-proven plasmacytoma  with 80% plasma cell involvement of the bone marrow. -- Recommend VRD chemotherapy with intention of proceeding to transplant. -- Labs at each visit to include CBC, CMP, LDH with monthly restaging labs SPEP and serum free light chains -- Started VRD therapy on 02/26/2022.  PLAN: --Presents today to start Cycle 10 , Day 8 of VRD therapy --Labs from today were reviewed and adequate for treatment. WBC 5.2, Hgb 12.6, MCV 88.4, Plt 186 --Most recent myeloma labs from 09/17/2022 shows M protein measuring 0.3 g/dL and normalized serum free light chains.  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  Findings are consistent with a stye secondary to this medication --Recommend hot compresses and we prescribed doxycycline 100 mg twice daily x14 days --Unfortunately, I advised that Dr. Leonides Schanz does not recommend to rechallenge bortezomib in the setting of ocular toxicity especially since he had one in the past.  --I will see patient back in 1 week to monitor symptoms. I will discuss with Dr. Candise Che regarding switching to Daratumumab/Revlimid/Dex regimen in the interim. --RTC in one  week   # Leg Discomfort/Restless Leg -- Patient provided with Requip by the emergency department, discontinued due to poor efficacy. --Evaluated by Dr. Barbaraann Cao on 07/28/2022. Recommended to short course of steroids so started prednisone 50 mg daily x 7 days. --Discussed etiologies including iron deficiency so iron levels were checked and was normal.   #Constipation: --Recommend to continue on colace but add miralax as well.   #Pathologic fractures-secondary to MM: --Involving L1, L2 and L3 compression fracture and left humerus fracture.  --Underwent kyphoplasty of L3 fracture on 02/19/2022 --Underwent medullary nailing of left humeral shaft on 02/22/2022 --Underwent kyphoplasty on 06/04/2022.   #Pain Medication -- Per palliative care patient has weaned off MS contin. Currently conitnues on gabapentin 100 mg BID, cymbalta 20 mg and robaxin as needed.  --Under the care of palliative care team.   #Supportive Care -- chemotherapy education complete -- port placement not required -- zofran 8mg  q8H PRN and compazine 10mg  PO q6H for nausea -- acyclovir 400mg  PO BID for VCZ prophylaxis -- allopurinol 300mg  PO daily for TLS prophylaxis -- Dental clearance for Zometa/Denosumab approved. Last dose of denosumab 07/16/2022. Continue monthly.   No orders of the defined types were placed in this encounter.   All questions were answered. The patient knows to call the clinic with any problems, questions or concerns.  I have spent a total of 30 minutes minutes of face-to-face and non-face-to-face time, preparing to see the patient,performing a medically appropriate examination, counseling and educating the patient, ordering medications/tests, documenting clinical information in the electronic health record,  and care coordination.   Georga Kaufmann PA-C Dept of Hematology and Oncology [facility] Phone: [phone]   09/24/2022 1:42 PM

## 2022-09-25 ENCOUNTER — Encounter: Payer: Self-pay | Admitting: Family Medicine

## 2022-09-25 NOTE — Progress Notes (Signed)
Established Patient Office Visit  Subjective    Patient ID: [name], male    DOB: 10-03-1970  Age: 52 y.o. MRN: [id number]  CC:  Chief Complaint  Patient presents with   Follow-up   Anxiety   Hypertension    HPI [name] presents for routine follow up of chronic med issues. Patient denies acute complaints or concerns.    Outpatient Encounter Medications as of 09/23/2022  Medication Sig   acyclovir (ZOVIRAX) 400 MG tablet Take 1 tablet (400 mg total) by mouth 2 (two) times daily.   allopurinol (ZYLOPRIM) 300 MG tablet Take 1 tablet (300 mg total) by mouth daily.   aspirin EC 81 MG tablet Take 81 mg by mouth daily. Swallow whole.   cetirizine (ZYRTEC) 10 MG tablet Take 10 mg by mouth daily as needed for allergies.   clonazePAM (KLONOPIN) 0.5 MG tablet Take 1 tablet (0.5 mg total) by mouth 2 (two) times daily as needed for anxiety.   docusate sodium (COLACE) 100 MG capsule Take 1 capsule (100 mg total) by mouth 2 (two) times daily as needed for mild constipation.   DULoxetine (CYMBALTA) 20 MG capsule Take 1 capsule by mouth once daily   gabapentin (NEURONTIN) 100 MG capsule Take 1 capsule by mouth twice daily   hydrochlorothiazide (HYDRODIURIL) 25 MG tablet Take 25 mg by mouth daily.   lisinopril (ZESTRIL) 40 MG tablet Take 40 mg by mouth daily.   Methocarbamol 1000 MG TABS Take 1,000 mg by mouth every 6 (six) hours as needed. (Patient taking differently: Take 1,000 mg by mouth every 6 (six) hours as needed (muscle spasms).)   morphine (MS CONTIN) 30 MG 12 hr tablet Take 1 tablet by mouth every 8 hours.   ondansetron (ZOFRAN) 8 MG tablet Take 1 tablet (8 mg total) by mouth every 8 (eight) hours as needed for nausea or vomiting.   pantoprazole (PROTONIX) 40 MG tablet Take 1 tablet by mouth twice daily   polyethylene glycol powder (GLYCOLAX/MIRALAX) 17 GM/SCOOP powder Take 1 capful (17 g) with water by mouth daily. (Patient taking differently: Take 17 g by mouth daily as  needed for mild constipation.)   prochlorperazine (COMPAZINE) 10 MG tablet Take 1 tablet (10 mg total) by mouth every 6 (six) hours as needed for nausea or vomiting.   REVLIMID 25 MG capsule Take 1 capsule (25 mg total) by mouth daily. Take one capsule daily x 14 days. None for following 7 days Auth #  [id number]  Date obtained 09/11/22   rosuvastatin (CRESTOR) 10 MG tablet Take 1 tablet (10 mg total) by mouth daily.   No facility-administered encounter medications on file as of 09/23/2022.    Past Medical History:  Diagnosis Date   Allergy    Anemia    Atypical chest pain    Cancer (HCC)    Multiple myeloma with normocytic anemia   GERD (gastroesophageal reflux disease)    Hepatic cyst    Benign by MRI   Hiatal hernia    History of colonic polyps    Hyperlipidemia    Hypertension    Rectal bleeding    Tobacco abuse     Past Surgical History:  Procedure Laterality Date   COLONOSCOPY  01/10/2009     RMR: Normal rectum, normal colon; repeat in 2015 due to FH of colon cancer   COLONOSCOPY N/A 01/09/2014   [id number] colonic polyps-removed as described   COLONOSCOPY N/A 09/18/2016   Procedure: COLONOSCOPY;  Surgeon: Corbin Ade,  MD;  [facility];  Service: Endoscopy;  Laterality: N/A;  730    ESOPHAGOGASTRODUODENOSCOPY  10/04/2007   RMR: Distal esophageal erosions consistent with erosive reflux esophagitis, patulous gastroesophageal junction status post passage of a  Maloney dilator, 56 [place].  Otherwise, unremarkable esophagus.  Hiatal hernia.  Otherwise normal stomach.  Bulbar erosion   ESOPHAGOGASTRODUODENOSCOPY N/A 01/09/2014   Erosive reflux esophagitis. Small hiatal hernia   HUMERUS IM NAIL Left 02/22/2022   Procedure: INTRAMEDULLARY (IM) NAIL HUMERAL;  Surgeon: Yolonda Kida, MD;  Location[facility] OR;  Service: Orthopedics;  Laterality: Left;   I & D EXTREMITY Left 07/26/2018   Procedure: IRRIGATION AND DEBRIDEMENT EXTREMITY;  Surgeon: Knute Neu, MD;   Location: [facility] OR;  Service: Plastics;  Laterality: Left;   IR BONE TUMOR(S)RF ABLATION  02/05/2022   IR BONE TUMOR(S)RF ABLATION  02/24/2022   IR KYPHO EA ADDL LEVEL THORACIC OR LUMBAR  02/19/2022   IR KYPHO LUMBAR INC FX REDUCE BONE BX UNI/BIL CANNULATION INC/IMAGING  02/05/2022   IR KYPHO LUMBAR INC FX REDUCE BONE BX UNI/BIL CANNULATION INC/IMAGING  02/19/2022   PERCUTANEOUS PINNING Left 07/26/2018   Procedure: PERCUTANEOUS PINNING EXTREMITY;  Surgeon: Knute Neu, MD;  Location: [facility] OR;  Service: Plastics;  Laterality: Left;    Family History  Problem Relation Age of Onset   Heart disease Mother    Hypertension Mother    Diabetes Father    Hypertension Father    Colon cancer Sister        passed away from colon cancer, in her 59s   Heart attack Maternal Uncle    Prostate cancer Maternal Uncle    Diabetes Other    Pancreatic cancer Neg Hx    Rectal cancer Neg Hx    Esophageal cancer Neg Hx    Stomach cancer Neg Hx     Social History   Socioeconomic History   Marital status: Divorced    Spouse name: Not on file   Number of children: 2   Years of education: Not on file   Highest education level: Not on file  Occupational History   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[employer]    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[employer]  Tobacco Use   Smoking status: Former    Packs/day: 0.25    Years: 10.00    Additional pack years: 0.00    Total pack years: 2.50    Types: Cigarettes    Quit date: 12/24/2021    Years since quitting: 0.7   Smokeless tobacco: Never  Vaping Use   Vaping Use: Never used  Substance and Sexual Activity   Alcohol use: Yes    Alcohol/week: 0.0 standard drinks of alcohol    Comment: occasional/wine on the weekends   Drug use: No   Sexual activity: Not on file  Other Topics Concern   Not on file  Social History Narrative   Right handed   Lives alone one story home   Social Determinants of Health   Financial Resource Strain: Not on file  Food  Insecurity: No Food Insecurity (07/17/2022)   Hunger Vital Sign    Worried About Running Out of Food in the Last Year: Never true    Ran Out of Food in the Last Year: Never true  Transportation Needs: No Transportation Needs (07/17/2022)   PRAPARE - Transportation    Lack of Transportation (Medical): No    Lack of Transportation (Non-Medical): No  Physical Activity: Not on file  Stress:  Not on file  Social Connections: Not on file  Intimate Partner Violence: Not At Risk (07/17/2022)   Humiliation, Afraid, Rape, and Kick questionnaire    Fear of Current or Ex-Partner: No    Emotionally Abused: No    Physically Abused: No    Sexually Abused: No    Review of Systems  All other systems reviewed and are negative.       Objective    BP 136/87   Pulse 93   Temp 97.7 F (36.5 C) (Oral)   Resp 16   Wt 235 lb 6.4 oz (106.8 kg)   SpO2 96%   BMI 34.76 kg/m   Physical Exam Vitals and nursing note reviewed.  Constitutional:      General: He is not in acute distress. Cardiovascular:     Rate and Rhythm: Normal rate and regular rhythm.  Pulmonary:     Effort: Pulmonary effort is normal.     Breath sounds: Normal breath sounds.  Abdominal:     Palpations: Abdomen is soft.     Tenderness: There is no abdominal tenderness.  Neurological:     General: No focal deficit present.     Mental Status: He is alert and oriented to person, place, and time.         Assessment & Plan:   1. Essential hypertension Appears stable. Continue   2. Anxiety and depression Appears stable. Continue   3. GERD without esophagitis Appears stable. Continue     Return in about 6 months (around 03/25/2023) for follow up.   Tommie Raymond, MD

## 2022-09-26 ENCOUNTER — Telehealth: Payer: Self-pay | Admitting: Physician Assistant

## 2022-09-28 ENCOUNTER — Encounter: Payer: Self-pay | Admitting: Hematology and Oncology

## 2022-09-29 ENCOUNTER — Encounter: Payer: Self-pay | Admitting: *Deleted

## 2022-09-29 ENCOUNTER — Inpatient Hospital Stay: Payer: BC Managed Care – PPO | Admitting: Physician Assistant

## 2022-09-29 ENCOUNTER — Other Ambulatory Visit: Payer: Self-pay

## 2022-09-29 ENCOUNTER — Encounter: Payer: Self-pay | Admitting: Hematology

## 2022-09-29 VITALS — BP 145/85 | HR 102 | Temp 97.9°F | Resp 16 | Wt 237.0 lb

## 2022-09-29 DIAGNOSIS — R5383 Other fatigue: Secondary | ICD-10-CM | POA: Diagnosis not present

## 2022-09-29 DIAGNOSIS — C9000 Multiple myeloma not having achieved remission: Secondary | ICD-10-CM | POA: Diagnosis not present

## 2022-09-29 DIAGNOSIS — H00011 Hordeolum externum right upper eyelid: Secondary | ICD-10-CM | POA: Diagnosis not present

## 2022-09-29 DIAGNOSIS — I10 Essential (primary) hypertension: Secondary | ICD-10-CM | POA: Diagnosis not present

## 2022-09-29 DIAGNOSIS — Z7982 Long term (current) use of aspirin: Secondary | ICD-10-CM | POA: Diagnosis not present

## 2022-09-29 DIAGNOSIS — Z5112 Encounter for antineoplastic immunotherapy: Secondary | ICD-10-CM | POA: Diagnosis not present

## 2022-09-29 DIAGNOSIS — G2581 Restless legs syndrome: Secondary | ICD-10-CM | POA: Diagnosis not present

## 2022-09-29 DIAGNOSIS — Z7189 Other specified counseling: Secondary | ICD-10-CM | POA: Insufficient documentation

## 2022-09-29 DIAGNOSIS — Z8719 Personal history of other diseases of the digestive system: Secondary | ICD-10-CM | POA: Diagnosis not present

## 2022-09-29 DIAGNOSIS — K5900 Constipation, unspecified: Secondary | ICD-10-CM | POA: Diagnosis not present

## 2022-09-29 DIAGNOSIS — E785 Hyperlipidemia, unspecified: Secondary | ICD-10-CM | POA: Diagnosis not present

## 2022-09-29 DIAGNOSIS — Z7961 Long term (current) use of immunomodulator: Secondary | ICD-10-CM | POA: Diagnosis not present

## 2022-09-29 DIAGNOSIS — K219 Gastro-esophageal reflux disease without esophagitis: Secondary | ICD-10-CM | POA: Diagnosis not present

## 2022-09-29 DIAGNOSIS — Z79899 Other long term (current) drug therapy: Secondary | ICD-10-CM | POA: Diagnosis not present

## 2022-09-29 DIAGNOSIS — Z87891 Personal history of nicotine dependence: Secondary | ICD-10-CM | POA: Diagnosis not present

## 2022-09-29 MED ORDER — DEXAMETHASONE 4 MG PO TABS
20.0000 mg | ORAL_TABLET | ORAL | 11 refills | Status: DC
Start: 2022-09-29 — End: 2022-11-03

## 2022-09-29 NOTE — Progress Notes (Unsigned)
[facility] [phone]   [phone]  PROGRESS NOTE  Patient Care Team: Georganna Skeans, MD as PCP - General (Family Medicine) Runell Gess, MD as PCP - Cardiology (Cardiology) Jena Gauss Gerrit Friends, MD as Attending Physician (Gastroenterology)  Hematological/Oncological History # IgG Kappa Multiple Myeloma, 1p32/1q21.  01/04/2022: MRI lumbar spine shows diffuse heterogeneous marrow signal with heterogeneous enhancement throughout the thoracolumbar spine.  Additionally, there is subacute-chronic L3 vertebral body compression fraction. 01/14/2022: establish care with Dr. Leonides Schanz  02/10/2022: bone marrow biopsy shows range of plasma cell involvement from 50% on the biopsy to 60% on aspirate smear slides and close to 90% on the clot section for an overall percentage estimated at approximately 80% overall. 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   Interval History:  [name] 52 y.o. male for continued management of IgG kappa multiple myeloma. The patient's last visit was on 09/24/2022 at which time we held velcade therapy due to development of ocular toxicity. He presents today for a follow up.     On exam today Mr. Dekam reports his stye and swelling are healing with doxycycline. He denies any vision changes. He is otherwise stable with his appetite and energy  levels. He denies any nausea, vomiting or abdominal pain. His bowel habits are  unchanged without recurrent episodes of diarrhea or constipation. He denies easy bruising or signs of bleeding.   He denies fevers, chills, sweats, shortness of breath, chest pain or cough. He has no other complaints. A full 10 point ROS was otherwise negative.  MEDICAL HISTORY:  Past Medical History:  Diagnosis Date   Allergy    Anemia    Atypical chest pain    Cancer (HCC)    Multiple myeloma with normocytic anemia   GERD (gastroesophageal reflux disease)    Hepatic cyst    Benign by MRI   Hiatal hernia    History of colonic polyps    Hyperlipidemia    Hypertension    Rectal bleeding    Tobacco abuse     SURGICAL HISTORY: Past Surgical History:  Procedure Laterality Date   COLONOSCOPY  01/10/2009     RMR: Normal rectum, normal colon; repeat in 2015 due to FH of colon cancer   COLONOSCOPY N/A 01/09/2014   AVW:[id number] colonic polyps-removed as described   COLONOSCOPY N/A 09/18/2016   Procedure: COLONOSCOPY;  Surgeon: Corbin Ade, MD;  [facility]: [facility];  Service: Endoscopy;  Laterality: N/A;  730    ESOPHAGOGASTRODUODENOSCOPY  10/04/2007   RMR: Distal esophageal erosions consistent with erosive reflux esophagitis, patulous gastroesophageal junction status post passage of a  Maloney dilator, 56 [place].  Otherwise, unremarkable esophagus.  Hiatal hernia.  Otherwise normal stomach.  Bulbar erosion   ESOPHAGOGASTRODUODENOSCOPY N/A 01/09/2014   Erosive reflux esophagitis. Small hiatal hernia   HUMERUS IM NAIL Left 02/22/2022   Procedure: INTRAMEDULLARY (IM) NAIL HUMERAL;  Surgeon: Yolonda Kida, MD;  Location: [facility] OR;  Service: Orthopedics;  Laterality: Left;   I & D EXTREMITY Left 07/26/2018   Procedure: IRRIGATION AND DEBRIDEMENT EXTREMITY;  Surgeon: Knute Neu, MD;  Location: [facility] OR;  Service: Plastics;  Laterality: Left;   IR BONE TUMOR(S)RF ABLATION  02/05/2022   IR BONE TUMOR(S)RF ABLATION  02/24/2022   IR KYPHO EA ADDL LEVEL THORACIC OR LUMBAR  02/19/2022   IR  KYPHO LUMBAR INC FX REDUCE BONE BX UNI/BIL CANNULATION INC/IMAGING  02/05/2022   IR KYPHO LUMBAR INC FX REDUCE BONE BX UNI/BIL CANNULATION INC/IMAGING  02/19/2022   PERCUTANEOUS PINNING Left 07/26/2018   Procedure: PERCUTANEOUS PINNING EXTREMITY;  Surgeon: Knute Neu, MD;  Location: [facility] OR;  Service: Plastics;  Laterality: Left;    SOCIAL HISTORY: Social History   Socioeconomic History   Marital status: Divorced    Spouse name: Not on file   Number of children: 2   Years of education: Not on file   Highest education level: Not on file  Occupational History   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[employer]    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[employer]  Tobacco Use   Smoking status: Former    Packs/day: 0.25    Years: 10.00    Additional pack years: 0.00    Total pack years: 2.50    Types: Cigarettes    Quit date: 12/24/2021    Years since quitting: 0.7   Smokeless tobacco: Never  Vaping Use   Vaping Use: Never used  Substance and Sexual Activity   Alcohol use: Yes    Alcohol/week: 0.0 standard drinks of alcohol    Comment: occasional/wine on the weekends   Drug use: No   Sexual activity: Not on file  Other Topics Concern   Not on file  Social History Narrative   Right handed   Lives alone one story home   Social Determinants of Health   Financial Resource Strain: Not on file  Food Insecurity: No Food Insecurity (07/17/2022)   Hunger Vital Sign    Worried About Running Out of Food in the Last Year: Never true    Ran Out of Food in the Last Year: Never true  Transportation Needs: No Transportation Needs (07/17/2022)   PRAPARE - Transportation    Lack of Transportation (Medical): No    Lack of Transportation (Non-Medical): No  Physical Activity: Not on file  Stress: Not on file  Social Connections: Not on file  Intimate Partner Violence: Not At Risk (07/17/2022)   Humiliation, Afraid, Rape, and Kick questionnaire    Fear of Current or Ex-Partner: No    Emotionally  Abused: No    Physically Abused: No    Sexually Abused: No    FAMILY HISTORY: Family History  Problem Relation Age of Onset   Heart disease Mother    Hypertension Mother    Diabetes Father    Hypertension Father    Colon cancer Sister        passed away from colon cancer, in her 66s   Heart attack Maternal Uncle    Prostate cancer Maternal Uncle    Diabetes Other    Pancreatic cancer Neg Hx    Rectal cancer Neg Hx    Esophageal cancer Neg Hx    Stomach cancer Neg Hx     ALLERGIES:  has No Known Allergies.  MEDICATIONS:  Current Outpatient Medications  Medication Sig Dispense Refill   acyclovir (ZOVIRAX) 400 MG tablet Take 1 tablet (400 mg total) by mouth 2 (two) times daily. 60  tablet 3   allopurinol (ZYLOPRIM) 300 MG tablet Take 1 tablet (300 mg total) by mouth daily. 30 tablet 3   aspirin EC 81 MG tablet Take 81 mg by mouth daily. Swallow whole.     cetirizine (ZYRTEC) 10 MG tablet Take 10 mg by mouth daily as needed for allergies.     clonazePAM (KLONOPIN) 0.5 MG tablet Take 1 tablet (0.5 mg total) by mouth 2 (two) times daily as needed for anxiety. 15 tablet 0   docusate sodium (COLACE) 100 MG capsule Take 1 capsule (100 mg total) by mouth 2 (two) times daily as needed for mild constipation. 30 capsule 3   doxycycline (VIBRA-TABS) 100 MG tablet Take 1 tablet (100 mg total) by mouth 2 (two) times daily. 28 tablet 0   DULoxetine (CYMBALTA) 20 MG capsule Take 1 capsule by mouth once daily 30 capsule 0   gabapentin (NEURONTIN) 100 MG capsule Take 1 capsule by mouth twice daily 60 capsule 2   hydrochlorothiazide (HYDRODIURIL) 25 MG tablet Take 25 mg by mouth daily.     lisinopril (ZESTRIL) 40 MG tablet Take 40 mg by mouth daily.     Methocarbamol 1000 MG TABS Take 1,000 mg by mouth every 6 (six) hours as needed. (Patient taking differently: Take 1,000 mg by mouth every 6 (six) hours as needed (muscle spasms).) 45 tablet 1   morphine (MS CONTIN) 30 MG 12 hr tablet Take 1 tablet  by mouth every 8 hours. 60 tablet 0   ondansetron (ZOFRAN) 8 MG tablet Take 1 tablet (8 mg total) by mouth every 8 (eight) hours as needed for nausea or vomiting. 60 tablet 3   pantoprazole (PROTONIX) 40 MG tablet Take 1 tablet by mouth twice daily 60 tablet 0   polyethylene glycol powder (GLYCOLAX/MIRALAX) 17 GM/SCOOP powder Take 1 capful (17 g) with water by mouth daily. (Patient taking differently: Take 17 g by mouth daily as needed for mild constipation.) 238 g 0   prochlorperazine (COMPAZINE) 10 MG tablet Take 1 tablet (10 mg total) by mouth every 6 (six) hours as needed for nausea or vomiting. 60 tablet 0   REVLIMID 25 MG capsule Take 1 capsule (25 mg total) by mouth daily. Take one capsule daily x 14 days. None for following 7 days Auth #  [id number]  Date obtained 09/11/22 14 capsule 0   rosuvastatin (CRESTOR) 10 MG tablet Take 1 tablet (10 mg total) by mouth daily. 90 tablet 1   No current facility-administered medications for this visit.    REVIEW OF SYSTEMS:   Constitutional: ( - ) fevers, ( - )  chills , ( - ) night sweats Eyes: ( - ) blurriness of vision, ( - ) double vision, ( - ) watery eyes Ears, nose, mouth, throat, and face: ( - ) mucositis, ( - ) sore throat Respiratory: ( - ) cough, ( - ) dyspnea, ( - ) wheezes Cardiovascular: ( - ) palpitation, ( - ) chest discomfort, ( - ) lower extremity swelling Gastrointestinal:  ( - ) nausea, ( - ) heartburn, ( - ) change in bowel habits Skin: ( - ) abnormal skin rashes Lymphatics: ( - ) new lymphadenopathy, ( - ) easy bruising Neurological: ( - ) numbness, ( - ) tingling, ( - ) new weaknesses Behavioral/Psych: ( - ) mood change, ( - ) new changes  All other systems were reviewed with the patient and are negative.  PHYSICAL EXAMINATION: ECOG PERFORMANCE STATUS: 1 - Symptomatic but completely ambulatory  Vitals:   09/29/22 1051  BP: (!) 145/85  Pulse: (!) 102  Resp: 16  Temp: 97.9 F (36.6 C)  SpO2: 100%   Filed Weights    09/29/22 1051  Weight: 237 lb (107.5 kg)   GENERAL: Well-appearing young African-American male, alert, no distress and comfortable SKIN: skin color, texture, turgor are normal, no rashes or significant lesions EYES: conjunctiva are pink and non-injected, sclera clear. Swelling in right eyelid with surround erythema. No purulent discharge noted.  LUNGS: clear to auscultation and percussion with normal breathing effort HEART: regular rate & rhythm and no murmurs and no lower extremity edema Musculoskeletal: no cyanosis of digits and no clubbing  PSYCH: alert & oriented x 3, fluent speech NEURO: no focal motor/sensory deficits  LABORATORY DATA:  I have reviewed the data as listed    Latest Ref Rng & Units 09/24/2022   12:24 PM 09/17/2022   12:25 PM 09/10/2022   12:03 PM  CBC  WBC 4.0 - 10.5 K/uL 5.2  5.6  7.4   Hemoglobin 13.0 - 17.0 g/dL 16.1  09.6  04.5   Hematocrit 39.0 - 52.0 % 38.8  40.0  38.4   Platelets 150 - 400 K/uL 186  225  229        Latest Ref Rng & Units 09/24/2022   12:24 PM 09/17/2022   12:25 PM 09/10/2022   12:03 PM  CMP  Glucose 70 - 99 mg/dL 409  811  914   BUN 6 - 20 mg/dL 12  17  21    Creatinine 0.61 - 1.24 mg/dL 7.82  9.56  2.13   Sodium 135 - 145 mmol/L 134  134  135   Potassium 3.5 - 5.1 mmol/L 4.0  4.2  4.0   Chloride 98 - 111 mmol/L 101  98  101   CO2 22 - 32 mmol/L 28  30  28    Calcium 8.9 - 10.3 mg/dL 8.4  9.7  9.4   Total Protein 6.5 - 8.1 g/dL 6.8  7.3  7.4   Total Bilirubin 0.3 - 1.2 mg/dL 0.6  0.6  0.5   Alkaline Phos 38 - 126 U/L 56  55  64   AST 15 - 41 U/L 18  17  15    ALT 0 - 44 U/L 22  19  15      Lab Results  Component Value Date   MPROTEIN 0.3 (H) 09/17/2022   MPROTEIN 0.3 (H) 08/26/2022   MPROTEIN 0.5 (H) 08/06/2022   Lab Results  Component Value Date   KPAFRELGTCHN 19.2 09/17/2022   KPAFRELGTCHN 17.1 08/26/2022   KPAFRELGTCHN 17.5 08/06/2022   LAMBDASER 9.8 09/17/2022   LAMBDASER 9.2 08/26/2022   LAMBDASER 9.1 08/06/2022    KAPLAMBRATIO 1.96 (H) 09/17/2022   KAPLAMBRATIO 1.86 (H) 08/26/2022   KAPLAMBRATIO 1.92 (H) 08/06/2022   RADIOGRAPHIC STUDIES: No results found.  ASSESSMENT & PLAN KRISEAN CRAVER 52 y.o. male with medical history significant for IgG kappa multiple myeloma who presents for a follow up visit.  After review of the labs, review the records, discussion with the patient the findings are most consistent with newly diagnosed multiple myeloma.  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  At this time would recommend proceeding with VRD chemotherapy with the intention of referral for ASCT when his labs are appropriate.  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  -- Diagnosis confirmed with lytic lesions of the spine as well as biopsy-proven  plasmacytoma with 80% plasma cell involvement of the bone marrow. -- Recommend VRD chemotherapy with intention of proceeding to transplant. -- Labs at each visit to include CBC, CMP, LDH with monthly restaging labs SPEP and serum free light chains -- Started VRD therapy on 02/26/2022. Held Velcade therapy on 09/24/2022 due to development of ocular toxicity.  PLAN: --Most recent myeloma labs from 09/17/2022 shows M protein measuring 0.3 g/dL and normalized serum free light chains.  --Patient developed ocular toxicity of right upper eyelid due to bortezomib last week.  Findings are consistent with a stye secondary to this medication --Recommend to rechallenge bortezomib in the setting of ocular toxicity especially since he had one in the past.  --Recommend to switch regimen to Dara/Rev/Dex. Reviewed dose, frequency and common side effects.  --Plan to start Cycle 1, Day 1 of Dara/Rev/Dex later this week.  --RTC for weekly treatments with biweekly toxicity checks.   #Ocular toxicity: --Secondary to bortezomib (velcade)  --Currently on doxycycline 100 mg BID x 14 days --D/c velcade starting 09/24/2022.  -erythromycin  ointment sent to pharmacy and counseled on eye care.  # Leg Discomfort/Restless Leg -- Patient provided with Requip by the emergency department, discontinued due to poor efficacy. --Evaluated by Dr. Barbaraann Cao on 07/28/2022. Recommended to short course of steroids so started prednisone 50 mg daily x 7 days. --Discussed etiologies including iron deficiency so iron levels were checked and was normal.   #Constipation: --Recommend to continue on colace but add miralax as well.   #Pathologic fractures-secondary to MM: --Involving L1, L2 and L3 compression fracture and left humerus fracture.  --Underwent kyphoplasty of L3 fracture on 02/19/2022 --Underwent medullary nailing of left humeral shaft on 02/22/2022 --Underwent kyphoplasty on 06/04/2022.   #Pain Medication -- Per palliative care patient has weaned off MS contin. Currently conitnues on gabapentin 100 mg BID, cymbalta 20 mg and robaxin as needed.  --Under the care of palliative care team.   #Supportive Care -- chemotherapy education complete -- port placement not required -- zofran 8mg  q8H PRN and compazine 10mg  PO q6H for nausea -- acyclovir 400mg  PO BID for VCZ prophylaxis -- allopurinol 300mg  PO daily for TLS prophylaxis -- Dental clearance for Zometa/Denosumab approved. Last dose of denosumab 07/16/2022. Continue monthly.   No orders of the defined types were placed in this encounter.   All questions were answered. The patient knows to call the clinic with any problems, questions or concerns.  I have spent a total of 40 minutes minutes of face-to-face and non-face-to-face time, preparing to see the patient,performing a medically appropriate examination, counseling and educating the patient, ordering medications/tests, documenting clinical information in the electronic health record,  and care coordination and placinf new orders for changes treatment with Daratumumab Faspro.Georga Kaufmann PA-C Dept of Hematology and Oncology [facility] Phone: [phone]  Patient was seen with Dr. Candise Che.   09/29/2022 10:48 AM   ADDENDUM  .Patient was Personally and independently interviewed, examined and relevant elements of the history of present illness were reviewed in details and an assessment and plan was created. All elements of the patient's history of present illness , assessment and plan were discussed in details with  Georga Kaufmann PA-C . The above documentation reflects our combined findings assessment and plan.   Wyvonnia Lora MD MS

## 2022-09-30 ENCOUNTER — Telehealth: Payer: Self-pay | Admitting: Physician Assistant

## 2022-09-30 ENCOUNTER — Inpatient Hospital Stay: Payer: BC Managed Care – PPO

## 2022-09-30 ENCOUNTER — Other Ambulatory Visit: Payer: Self-pay | Admitting: Hematology

## 2022-09-30 NOTE — Progress Notes (Signed)
Pharmacist Chemotherapy Monitoring - Initial Assessment    Anticipated start date: 10/02/22   The following has been reviewed per standard work regarding the patient's treatment regimen: The patient's diagnosis, treatment plan and drug doses, and organ/hematologic function Lab orders and baseline tests specific to treatment regimen  The treatment plan start date, drug sequencing, and pre-medications Prior authorization status  Patient's documented medication list, including drug-drug interaction screen and prescriptions for anti-emetics and supportive care specific to the treatment regimen The drug concentrations, fluid compatibility, administration routes, and timing of the medications to be used The patient's access for treatment and lifetime cumulative dose history, if applicable  The patient's medication allergies and previous infusion related reactions, if applicable   Changes made to treatment plan:  N/A  Follow up needed:  adding lab orders - T&S & pre-tx phenotype.   Ebony Hail, Pharm.D., CPP 09/30/2022@12 :53 PM

## 2022-10-01 ENCOUNTER — Ambulatory Visit: Payer: BC Managed Care – PPO | Admitting: Gastroenterology

## 2022-10-01 ENCOUNTER — Inpatient Hospital Stay: Payer: BC Managed Care – PPO

## 2022-10-01 ENCOUNTER — Other Ambulatory Visit: Payer: BC Managed Care – PPO

## 2022-10-01 ENCOUNTER — Inpatient Hospital Stay: Payer: BC Managed Care – PPO | Admitting: Physician Assistant

## 2022-10-01 ENCOUNTER — Encounter: Payer: Self-pay | Admitting: Hematology

## 2022-10-01 ENCOUNTER — Encounter: Payer: Self-pay | Admitting: Gastroenterology

## 2022-10-01 ENCOUNTER — Telehealth: Payer: Self-pay | Admitting: Physician Assistant

## 2022-10-01 ENCOUNTER — Ambulatory Visit: Payer: BC Managed Care – PPO | Admitting: Physician Assistant

## 2022-10-01 ENCOUNTER — Encounter: Payer: Self-pay | Admitting: Hematology and Oncology

## 2022-10-01 VITALS — BP 122/80 | HR 109 | Ht 69.0 in | Wt 234.0 lb

## 2022-10-01 DIAGNOSIS — R1084 Generalized abdominal pain: Secondary | ICD-10-CM

## 2022-10-01 DIAGNOSIS — K219 Gastro-esophageal reflux disease without esophagitis: Secondary | ICD-10-CM

## 2022-10-01 DIAGNOSIS — R9389 Abnormal findings on diagnostic imaging of other specified body structures: Secondary | ICD-10-CM

## 2022-10-01 DIAGNOSIS — C9000 Multiple myeloma not having achieved remission: Secondary | ICD-10-CM | POA: Diagnosis not present

## 2022-10-01 DIAGNOSIS — Z86010 Personal history of colonic polyps: Secondary | ICD-10-CM

## 2022-10-01 MED ORDER — NA SULFATE-K SULFATE-MG SULF 17.5-3.13-1.6 GM/177ML PO SOLN: 1.0000 | Freq: Once | ORAL | 0 refills | Status: AC

## 2022-10-01 MED ORDER — ERYTHROMYCIN 5 MG/GM OP OINT: 1.0000 | TOPICAL_OINTMENT | Freq: Every day | OPHTHALMIC | 1 refills | Status: AC

## 2022-10-01 NOTE — Patient Instructions (Addendum)
You have been scheduled for an endoscopy and colonoscopy. Please follow the written instructions given to you at your visit today. Please pick up your prep supplies at the pharmacy within the next 1-3 days. If you use inhalers (even only as needed), please bring them with you on the day of your procedure.  _______________________________________________________  If your blood pressure at your visit was 140/90 or greater, please contact your primary care physician to follow up on this.  _______________________________________________________  If you are age 54 or older, your body mass index should be between 23-30. Your Body mass index is 34.56 kg/m. If this is out of the aforementioned range listed, please consider follow up with your Primary Care Provider.  If you are age 67 or younger, your body mass index should be between 19-25. Your Body mass index is 34.56 kg/m. If this is out of the aformentioned range listed, please consider follow up with your Primary Care Provider.   __________________________________________________________  The [facility] providers would like to encourage you to use [facility] to communicate with providers for non-urgent requests or questions.  Due to long hold times on the telephone, sending your provider a message by [facility] may be a faster and more efficient way to get a response.  Please allow 48 business hours for a response.  Please remember that this is for non-urgent requests.   Due to recent changes in healthcare laws, you may see the results of your imaging and laboratory studies on MyChart before your provider has had a chance to review them.  We understand that in some cases there may be results that are confusing or concerning to you. Not all laboratory results come back in the same time frame and the provider may be waiting for multiple results in order to interpret others.  Please give [place] 48 hours in order for your provider to thoroughly review all the  results before contacting the office for clarification of your results.   Thank you for choosing me and [facility].  Vito Cirigliano, D.O.

## 2022-10-01 NOTE — Telephone Encounter (Signed)
Per secure chat from [place] she spoke with Candise Che and wants to hold off on the Follow up today, she wants to move labs to tomorrows visit before treatment, spoke with patient, he confirmed he will be here tomorrow.

## 2022-10-01 NOTE — Progress Notes (Signed)
Chief Complaint:    Generalized abdominal pain, GERD, abnormal CT  GI History: 52 year old male with a history of HTN, HLD, follows in the GI clinic for the following:  1) GERD: Longstanding history of reflux symptoms for 20+ years. Previously treated with Dexilant and omeprazole, but eventually titrated off of these.  Symptoms started to recur in 2022, described as heartburn, substernal chest pain, regurgitation. Re-started Protonix 40 mg/day in 02/2021.  Does have intermittent solid food dysphagia; resolved with EGD with dilation in 05/2021 as below.   2) RUQ pain, abdominal bloating.  Symptoms started about 11/2020.  Seemingly occurs at random.   Family history notable for sister with colon cancer, deceased in her 19s.   Evaluation to date: -05/2016: MRI abdomen: 1.2 cm liver cyst with lobulated contour and thin internal septation.  Recommended repeat MRI in 12 months. - 09/2020: Normal CMP - 02/2021: Initial appointment in GI clinic.  Exam with mild generalized TTP.  Recommended MRI liver; not yet done - 05/08/2021: ER evaluation for upper abdominal pain.  RUQ [place]: Normal liver.  1.7 cm cyst in the inferior left hepatic lobe.  Normal CBC.  AST/ALT 46/41, normal ALP, T. bili, lipase - 05/30/2021: EGD/colonoscopy as below - 07/21/2021: ER evaluation for RLQ pain, nausea/diarrhea.  CT A/P: Small hepatic cyst, otherwise normal liver.  No duct dilation.  Normal GI tract.  Normal CBC, liver enzymes, lipase.  Treated with Bactrim for possible scrotal draining abscess with urology referral - 09/06/2021: HIDA: Normal - 07/17/2022: CT A/P: Moderate chronic fold thickening in the stomach, probable chronic gastritis.  Increasingly prominent appendix at 11 mm, moderate stool retention, diverticulosis.  Demineralization and heterogeneity of lower ribs, visualized spine, pelvis, sacrum with innumerable lucent lesions and multiple vertebral body wedge compression fractures.  Multilevel kyphoplasty. Treated suspected  appendicitis with Abx alone.    Endoscopic History: - EGD (12/2013): Erosive esophagitis, small hiatal hernia - Colonoscopy (12/2013): 1 cm polyp (tubular adenoma) in descending colon, diminutive polyp (tubular adenoma) at splenic flexure - Colonoscopy (08/2016): Poor prep - EGD (05/30/2021): Benign gastric inlet patch, otherwise normal esophagus.  Empiric dilation with 52 [place].  Biopsies negative for EOE.  Irregular Z-line (path: Benign), Mild non-H. pylori gastritis.  Normal duodenum (path benign) - Colonoscopy (05/2021): 2 ascending colon polyps (TA x2), 2 sigmoid polyps (TA x1, HP x1).  Normal TI.  Repeat in 5 years  HPI:     Patient is a 52 y.o. male presenting to the Gastroenterology Clinic for follow-up.  Last seen by me in the office on 08/20/2021.  Main issue at that time was intermittent right-sided abdominal pain, typically with activity/exercise, but also occurring at rest at times.  Referred for HIDA scan (normal).  Also with abdominal bloating, treated with low FODMAP diet and Gas-X.  Since last appoint with me, was diagnosed with Multiple Myeloma in 01/2022, treated with VRD chemotherapy, kyphoplasty.  Follows in the Hematology Oncology clinic, last seen 09/29/2022.  Treating his constipation with Colace and added MiraLAX.  Currently on gabapentin, Cymbalta, and Robaxin as needed (has weaned off MS Contin).  Has appt with Spine Surgeon and his Oncologist tomorrow.   He has had recurrence of generalized L>R abdominal pain, and he is concerned about abdominal pain being malignant. No change in bowel habits.  Pain tends to be intermittent and not related to p.o. intake.  He was admitted on 07/17/2022 with acute appendicitis, medically managed with ABX alone.  Additionally, the CT on that admission was also n/f  moderate thickening of the gastric lining, and this also concerns him from a malignant perspective.  Otherwise good p.o. intake and has regained the weight that he lost when  starting chemotherapy.        Latest Ref Rng & Units 09/24/2022   12:24 PM 09/17/2022   12:25 PM 09/10/2022   12:03 PM  CBC  WBC 4.0 - 10.5 K/uL 5.2  5.6  7.4   Hemoglobin 13.0 - 17.0 g/dL 03.4  74.2  59.5   Hematocrit 39.0 - 52.0 % 38.8  40.0  38.4   Platelets 150 - 400 K/uL 186  225  229       Latest Ref Rng & Units 09/24/2022   12:24 PM 09/17/2022   12:25 PM 09/10/2022   12:03 PM  CMP  Glucose 70 - 99 mg/dL 638  756  433   BUN 6 - 20 mg/dL 12  17  21    Creatinine 0.61 - 1.24 mg/dL 2.95  1.88  4.16   Sodium 135 - 145 mmol/L 134  134  135   Potassium 3.5 - 5.1 mmol/L 4.0  4.2  4.0   Chloride 98 - 111 mmol/L 101  98  101   CO2 22 - 32 mmol/L 28  30  28    Calcium 8.9 - 10.3 mg/dL 8.4  9.7  9.4   Total Protein 6.5 - 8.1 g/dL 6.8  7.3  7.4   Total Bilirubin 0.3 - 1.2 mg/dL 0.6  0.6  0.5   Alkaline Phos 38 - 126 U/L 56  55  64   AST 15 - 41 U/L 18  17  15    ALT 0 - 44 U/L 22  19  15       Review of systems:     No chest pain, no SOB, no fevers, no urinary sx   Past Medical History:  Diagnosis Date   Allergy    Anemia    Atypical chest pain    Cancer (HCC)    Multiple myeloma with normocytic anemia   GERD (gastroesophageal reflux disease)    Hepatic cyst    Benign by MRI   Hiatal hernia    History of colonic polyps    Hyperlipidemia    Hypertension    Rectal bleeding    Tobacco abuse     Patient's surgical history, family medical history, social history, medications and allergies were all reviewed in Epic    Current Outpatient Medications  Medication Sig Dispense Refill   acyclovir (ZOVIRAX) 400 MG tablet Take 1 tablet (400 mg total) by mouth 2 (two) times daily. 60 tablet 3   allopurinol (ZYLOPRIM) 300 MG tablet Take 1 tablet (300 mg total) by mouth daily. 30 tablet 3   aspirin EC 81 MG tablet Take 81 mg by mouth daily. Swallow whole.     cetirizine (ZYRTEC) 10 MG tablet Take 10 mg by mouth daily as needed for allergies.     clonazePAM (KLONOPIN) 0.5 MG tablet  Take 1 tablet (0.5 mg total) by mouth 2 (two) times daily as needed for anxiety. 15 tablet 0   dexamethasone (DECADRON) 4 MG tablet Take 5 tablets (20 mg total) by mouth once a week. Take the day after darzalex faspro. Take with breakfast. 20 tablet 11   docusate sodium (COLACE) 100 MG capsule Take 1 capsule (100 mg total) by mouth 2 (two) times daily as needed for mild constipation. 30 capsule 3   doxycycline (VIBRA-TABS) 100 MG tablet Take 1 tablet (100  mg total) by mouth 2 (two) times daily. 28 tablet 0   DULoxetine (CYMBALTA) 20 MG capsule Take 1 capsule by mouth once daily 30 capsule 0   erythromycin ophthalmic ointment Place 1 Application into the right eye at bedtime for 10 days. 3.5 g 1   gabapentin (NEURONTIN) 100 MG capsule Take 1 capsule by mouth twice daily 60 capsule 2   hydrochlorothiazide (HYDRODIURIL) 25 MG tablet Take 25 mg by mouth daily.     lisinopril (ZESTRIL) 40 MG tablet Take 40 mg by mouth daily.     Methocarbamol 1000 MG TABS Take 1,000 mg by mouth every 6 (six) hours as needed. (Patient taking differently: Take 1,000 mg by mouth every 6 (six) hours as needed (muscle spasms).) 45 tablet 1   morphine (MS CONTIN) 30 MG 12 hr tablet Take 1 tablet by mouth every 8 hours. 60 tablet 0   ondansetron (ZOFRAN) 8 MG tablet Take 1 tablet (8 mg total) by mouth every 8 (eight) hours as needed for nausea or vomiting. 60 tablet 3   pantoprazole (PROTONIX) 40 MG tablet Take 1 tablet by mouth twice daily 60 tablet 0   polyethylene glycol powder (GLYCOLAX/MIRALAX) 17 GM/SCOOP powder Take 1 capful (17 g) with water by mouth daily. (Patient taking differently: Take 17 g by mouth daily as needed for mild constipation.) 238 g 0   prochlorperazine (COMPAZINE) 10 MG tablet Take 1 tablet (10 mg total) by mouth every 6 (six) hours as needed for nausea or vomiting. 60 tablet 0   REVLIMID 25 MG capsule Take 1 capsule (25 mg total) by mouth daily. Take one capsule daily x 14 days. None for following 7  days Auth #  [id number]  Date obtained 09/11/22 14 capsule 0   rosuvastatin (CRESTOR) 10 MG tablet Take 1 tablet (10 mg total) by mouth daily. 90 tablet 1   No current facility-administered medications for this visit.    Physical Exam:     BP 122/80   Pulse (!) 109   Ht 5\' 9"  (1.753 m)   Wt 234 lb (106.1 kg)   BMI 34.56 kg/m   GENERAL:  Pleasant male in NAD PSYCH: : Cooperative, normal affect EENT:  conjunctiva pink, mucous membranes moist, neck supple without masses CARDIAC:  RRR, no murmur heard, no peripheral edema PULM: Normal respiratory effort, lungs CTA bilaterally, no wheezing ABDOMEN:  Nondistended, soft, nontender. No obvious masses, no hepatomegaly,  normal bowel sounds SKIN:  turgor, no lesions seen Musculoskeletal:  Normal muscle tone, normal strength NEURO: Alert and oriented x 3, no focal neurologic deficits   IMPRESSION and PLAN:    1) Generalized abdominal pain 2) Abnormal CT 3) Recent acute appendicitis We discussed the role/utility of endoscopic evaluation for his symptoms and CT findings.  Given his history of Multiple Myeloma, currently in treatment, he is very concerned about potential malignant etiologies for his pain, recent acute appendicitis, and the abnormal CT findings, and very strongly wants to undergo EGD and colonoscopy.  We discussed the risk/benefit profile at length, including in-depth conversation regarding additional risks and patient currently undergoing chemotherapy.  With that said, he is still very strongly in favor of endoscopic evaluation. - Will tentatively plan for EGD/colonoscopy - I will dialogue directly with his Oncology team regarding safety profile of EGD/colonoscopy which will largely be diagnostic in the setting of active cancer management  4) GERD - Well-controlled on current therapy  5) History of colon polyps - As above, tentatively planning for colonoscopy for diagnostic  purposes, but would certainly perform polyp  surveillance at that same time  6) Multiple myeloma - Has follow-up with Oncology and Spine Surgery tomorrow - As above, will discuss with his Oncology team regarding EGD/colonoscopy   The indications, risks, and benefits of EGD and colonoscopy were explained to the patient in detail. Risks include but are not limited to bleeding, perforation, adverse reaction to medications, and cardiopulmonary compromise. Sequelae include but are not limited to the possibility of surgery, hospitalization, and mortality. The patient verbalized understanding and wished to proceed. All questions answered, referred to scheduler and bowel prep ordered. Further recommendations pending results of the exam.             Shellia Cleverly ,DO, FACG 10/01/2022, 3:24 PM

## 2022-10-02 ENCOUNTER — Inpatient Hospital Stay: Payer: BC Managed Care – PPO

## 2022-10-02 ENCOUNTER — Ambulatory Visit: Payer: BC Managed Care – PPO | Admitting: Family Medicine

## 2022-10-02 VITALS — BP 112/72 | HR 82 | Temp 97.6°F | Resp 18 | Ht 69.0 in | Wt 236.0 lb

## 2022-10-02 DIAGNOSIS — C9000 Multiple myeloma not having achieved remission: Secondary | ICD-10-CM

## 2022-10-02 DIAGNOSIS — S22070A Wedge compression fracture of T9-T10 vertebra, initial encounter for closed fracture: Secondary | ICD-10-CM | POA: Diagnosis not present

## 2022-10-02 DIAGNOSIS — Z7189 Other specified counseling: Secondary | ICD-10-CM

## 2022-10-02 DIAGNOSIS — E785 Hyperlipidemia, unspecified: Secondary | ICD-10-CM | POA: Diagnosis not present

## 2022-10-02 DIAGNOSIS — K219 Gastro-esophageal reflux disease without esophagitis: Secondary | ICD-10-CM | POA: Diagnosis not present

## 2022-10-02 DIAGNOSIS — R5383 Other fatigue: Secondary | ICD-10-CM | POA: Diagnosis not present

## 2022-10-02 DIAGNOSIS — M4802 Spinal stenosis, cervical region: Secondary | ICD-10-CM | POA: Diagnosis not present

## 2022-10-02 DIAGNOSIS — Z79899 Other long term (current) drug therapy: Secondary | ICD-10-CM | POA: Diagnosis not present

## 2022-10-02 DIAGNOSIS — Z7982 Long term (current) use of aspirin: Secondary | ICD-10-CM | POA: Diagnosis not present

## 2022-10-02 DIAGNOSIS — R2689 Other abnormalities of gait and mobility: Secondary | ICD-10-CM | POA: Diagnosis not present

## 2022-10-02 DIAGNOSIS — Z7961 Long term (current) use of immunomodulator: Secondary | ICD-10-CM | POA: Diagnosis not present

## 2022-10-02 DIAGNOSIS — Z8719 Personal history of other diseases of the digestive system: Secondary | ICD-10-CM | POA: Diagnosis not present

## 2022-10-02 DIAGNOSIS — Z87891 Personal history of nicotine dependence: Secondary | ICD-10-CM | POA: Diagnosis not present

## 2022-10-02 DIAGNOSIS — G2581 Restless legs syndrome: Secondary | ICD-10-CM | POA: Diagnosis not present

## 2022-10-02 DIAGNOSIS — K5900 Constipation, unspecified: Secondary | ICD-10-CM | POA: Diagnosis not present

## 2022-10-02 DIAGNOSIS — I10 Essential (primary) hypertension: Secondary | ICD-10-CM | POA: Diagnosis not present

## 2022-10-02 DIAGNOSIS — M6281 Muscle weakness (generalized): Secondary | ICD-10-CM | POA: Diagnosis not present

## 2022-10-02 DIAGNOSIS — Z5112 Encounter for antineoplastic immunotherapy: Secondary | ICD-10-CM | POA: Diagnosis not present

## 2022-10-02 LAB — CBC WITH DIFFERENTIAL (CANCER CENTER ONLY)
Abs Immature Granulocytes: 0.02 10*3/uL (ref 0.00–0.07)
Basophils Absolute: 0 10*3/uL (ref 0.0–0.1)
Basophils Relative: 1 %
Eosinophils Absolute: 0 10*3/uL (ref 0.0–0.5)
Eosinophils Relative: 0 %
HCT: 39.1 % (ref 39.0–52.0)
Hemoglobin: 12.6 g/dL — ABNORMAL LOW (ref 13.0–17.0)
Immature Granulocytes: 0 %
Lymphocytes Relative: 38 %
Lymphs Abs: 2.2 10*3/uL (ref 0.7–4.0)
MCH: 28.5 pg (ref 26.0–34.0)
MCHC: 32.2 g/dL (ref 30.0–36.0)
MCV: 88.5 fL (ref 80.0–100.0)
Monocytes Absolute: 0.6 10*3/uL (ref 0.1–1.0)
Monocytes Relative: 10 %
Neutro Abs: 3 10*3/uL (ref 1.7–7.7)
Neutrophils Relative %: 51 %
Platelet Count: 209 10*3/uL (ref 150–400)
RBC: 4.42 MIL/uL (ref 4.22–5.81)
RDW: 13.7 % (ref 11.5–15.5)
WBC Count: 5.8 10*3/uL (ref 4.0–10.5)
nRBC: 0 % (ref 0.0–0.2)

## 2022-10-02 LAB — TYPE AND SCREEN
ABO/RH(D): A POS
Antibody Screen: NEGATIVE

## 2022-10-02 LAB — CMP (CANCER CENTER ONLY)
ALT: 16 U/L (ref 0–44)
AST: 16 U/L (ref 15–41)
Albumin: 4.3 g/dL (ref 3.5–5.0)
Alkaline Phosphatase: 60 U/L (ref 38–126)
Anion gap: 7 (ref 5–15)
BUN: 14 mg/dL (ref 6–20)
CO2: 27 mmol/L (ref 22–32)
Calcium: 9.5 mg/dL (ref 8.9–10.3)
Chloride: 103 mmol/L (ref 98–111)
Creatinine: 0.9 mg/dL (ref 0.61–1.24)
GFR, Estimated: >60 mL/min (ref >60)
Glucose, Bld: 120 mg/dL — ABNORMAL HIGH (ref 70–99)
Potassium: 4.1 mmol/L (ref 3.5–5.1)
Sodium: 137 mmol/L (ref 135–145)
Total Bilirubin: 0.5 mg/dL (ref 0.3–1.2)
Total Protein: 7.5 g/dL (ref 6.5–8.1)

## 2022-10-02 MED ORDER — DIPHENHYDRAMINE HCL 25 MG PO CAPS
50.0000 mg | ORAL_CAPSULE | Freq: Once | ORAL | Status: AC
  Administered 2022-10-02: 50 mg via ORAL
  Filled 2022-10-02: qty 2

## 2022-10-02 MED ORDER — DARATUMUMAB-HYALURONIDASE-FIHJ 1800-30000 MG-UT/15ML ~~LOC~~ SOLN
1800.0000 mg | Freq: Once | SUBCUTANEOUS | Status: AC
  Administered 2022-10-02: 1800 mg via SUBCUTANEOUS
  Filled 2022-10-02: qty 15

## 2022-10-02 MED ORDER — ACETAMINOPHEN 325 MG PO TABS
650.0000 mg | ORAL_TABLET | Freq: Once | ORAL | Status: AC
  Administered 2022-10-02: 650 mg via ORAL
  Filled 2022-10-02: qty 2

## 2022-10-02 MED ORDER — MONTELUKAST SODIUM 10 MG PO TABS
10.0000 mg | ORAL_TABLET | Freq: Once | ORAL | Status: AC
  Administered 2022-10-02: 10 mg via ORAL
  Filled 2022-10-02: qty 1

## 2022-10-02 MED ORDER — DEXAMETHASONE 4 MG PO TABS
20.0000 mg | ORAL_TABLET | Freq: Once | ORAL | Status: AC
  Administered 2022-10-02: 20 mg via ORAL
  Filled 2022-10-02: qty 5

## 2022-10-02 MED ORDER — FAMOTIDINE 20 MG PO TABS
20.0000 mg | ORAL_TABLET | Freq: Once | ORAL | Status: AC
  Administered 2022-10-02: 20 mg via ORAL
  Filled 2022-10-02: qty 1

## 2022-10-02 NOTE — Progress Notes (Signed)
Patient monitored for 2hrs post Darazlex Faspro injection with no adverse symptoms noted. VS consistent with previous at time of discharge. Pt ambulated independently to lobby post monitoring period.

## 2022-10-02 NOTE — Patient Instructions (Signed)
[facility]  Discharge Instructions: Thank you for choosing [facility] to provide your oncology and hematology care.   If you have a lab appointment with the Cancer Center, please go directly to the Cancer Center and check in at the registration area.   Wear comfortable clothing and clothing appropriate for easy access to any Portacath or PICC line.   We strive to give you quality time with your provider. You may need to reschedule your appointment if you arrive late (15 or more minutes).  Arriving late affects you and other patients whose appointments are after yours.  Also, if you miss three or more appointments without notifying the office, you may be dismissed from the clinic at the provider's discretion.      For prescription refill requests, have your pharmacy contact our office and allow 72 hours for refills to be completed.    Today you received the following chemotherapy and/or immunotherapy agents Darzalex Faspro      To help prevent nausea and vomiting after your treatment, we encourage you to take your nausea medication as directed.  BELOW ARE SYMPTOMS THAT SHOULD BE REPORTED IMMEDIATELY: *FEVER GREATER THAN 100.4 F (38 C) OR HIGHER *CHILLS OR SWEATING *NAUSEA AND VOMITING THAT IS NOT CONTROLLED WITH YOUR NAUSEA MEDICATION *UNUSUAL SHORTNESS OF BREATH *UNUSUAL BRUISING OR BLEEDING *URINARY PROBLEMS (pain or burning when urinating, or frequent urination) *BOWEL PROBLEMS (unusual diarrhea, constipation, pain near the anus) TENDERNESS IN MOUTH AND THROAT WITH OR WITHOUT PRESENCE OF ULCERS (sore throat, sores in mouth, or a toothache) UNUSUAL RASH, SWELLING OR PAIN  UNUSUAL VAGINAL DISCHARGE OR ITCHING   Items with * indicate a potential emergency and should be followed up as soon as possible or go to the Emergency Department if any problems should occur.  Please show the CHEMOTHERAPY ALERT CARD or IMMUNOTHERAPY ALERT CARD at  check-in to the Emergency Department and triage nurse.  Should you have questions after your visit or need to cancel or reschedule your appointment, please contact [facility]  Dept: [phone]  and follow the prompts.  Office hours are 8:00 a.m. to 4:30 p.m. Monday - Friday. Please note that voicemails left after 4:00 p.m. may not be returned until the following business day.  We are closed weekends and major holidays. You have access to a nurse at all times for urgent questions. Please call the main number to the clinic Dept: [phone] and follow the prompts.   For any non-urgent questions, you may also contact your provider using MyChart. We now offer e-Visits for anyone 18 and older to request care online for non-urgent symptoms. For details visit [email or web address].   Also download the MyChart app! Go to the app store, search "MyChart", open the app, select [facility], and log in with your MyChart username and password.  Daratumumab; Hyaluronidase Injection What is this medication? DARATUMUMAB; HYALURONIDASE (dar a toom ue mab; hye al ur ON i dase) treats multiple myeloma, a type of bone marrow cancer. Daratumumab works by blocking a protein that causes cancer cells to grow and multiply. This helps to slow or stop the spread of cancer cells. Hyaluronidase works by increasing the absorption of other medications in the body to help them work better. This medication may also be used treat amyloidosis, a condition that causes the buildup of a protein (amyloid) in your body. It works by reducing the buildup of this protein, which decreases symptoms. It   is a combination medication that contains a monoclonal antibody. This medicine may be used for other purposes; ask your health care provider or pharmacist if you have questions. COMMON BRAND NAME(S): DARZALEX FASPRO What should I tell my care team before I take this medication? They need to know if you  have any of these conditions: Heart disease Infection, such as chickenpox, cold sores, herpes, hepatitis B Lung or breathing disease An unusual or allergic reaction to daratumumab, hyaluronidase, other medications, foods, dyes, or preservatives Pregnant or trying to get pregnant Breast-feeding How should I use this medication? This medication is injected under the skin. It is given by your care team in a hospital or clinic setting. Talk to your care team about the use of this medication in children. Special care may be needed. Overdosage: If you think you have taken too much of this medicine contact a poison control center or emergency room at once. NOTE: This medicine is only for you. Do not share this medicine with others. What if I miss a dose? Keep appointments for follow-up doses. It is important not to miss your dose. Call your care team if you are unable to keep an appointment. What may interact with this medication? Interactions have not been studied. This list may not describe all possible interactions. Give your health care provider a list of all the medicines, herbs, non-prescription drugs, or dietary supplements you use. Also tell them if you smoke, drink alcohol, or use illegal drugs. Some items may interact with your medicine. What should I watch for while using this medication? Your condition will be monitored carefully while you are receiving this medication. This medication can cause serious allergic reactions. To reduce your risk, your care team may give you other medication to take before receiving this one. Be sure to follow the directions from your care team. This medication can affect the results of blood tests to match your blood type. These changes can last for up to 6 months after the final dose. Your care team will do blood tests to match your blood type before you start treatment. Tell all of your care team that you are being treated with this medication before  receiving a blood transfusion. This medication can affect the results of some tests used to determine treatment response; extra tests may be needed to evaluate response. Talk to your care team if you wish to become pregnant or think you are pregnant. This medication can cause serious birth defects if taken during pregnancy and for 3 months after the last dose. A reliable form of contraception is recommended while taking this medication and for 3 months after the last dose. Talk to your care team about effective forms of contraception. Do not breast-feed while taking this medication. What side effects may I notice from receiving this medication? Side effects that you should report to your care team as soon as possible: Allergic reactions--skin rash, itching, hives, swelling of the face, lips, tongue, or throat Heart rhythm changes--fast or irregular heartbeat, dizziness, feeling faint or lightheaded, chest pain, trouble breathing Infection--fever, chills, cough, sore throat, wounds that don't heal, pain or trouble when passing urine, general feeling of discomfort or being unwell Infusion reactions--chest pain, shortness of breath or trouble breathing, feeling faint or lightheaded Sudden eye pain or change in vision such as blurry vision, seeing halos around lights, vision loss Unusual bruising or bleeding Side effects that usually do not require medical attention (report to your care team if they continue or are   bothersome): Constipation Diarrhea Fatigue Nausea Pain, tingling, or numbness in the hands or feet Swelling of the ankles, hands, or feet This list may not describe all possible side effects. Call your doctor for medical advice about side effects. You may report side effects to FDA at 1-800-FDA-1088. Where should I keep my medication? This medication is given in a hospital or clinic. It will not be stored at home. NOTE: This sheet is a summary. It may not cover all possible information.  If you have questions about this medicine, talk to your doctor, pharmacist, or health care provider.  2023 Elsevier/Gold Standard (2021-09-04 00:00:00)  

## 2022-10-03 LAB — PRETREATMENT RBC PHENOTYPE

## 2022-10-05 ENCOUNTER — Other Ambulatory Visit: Payer: Self-pay | Admitting: Family Medicine

## 2022-10-06 ENCOUNTER — Other Ambulatory Visit: Payer: Self-pay | Admitting: *Deleted

## 2022-10-06 ENCOUNTER — Other Ambulatory Visit: Payer: Self-pay | Admitting: Orthopedic Surgery

## 2022-10-06 DIAGNOSIS — M6281 Muscle weakness (generalized): Secondary | ICD-10-CM

## 2022-10-06 DIAGNOSIS — S22070A Wedge compression fracture of T9-T10 vertebra, initial encounter for closed fracture: Secondary | ICD-10-CM

## 2022-10-06 DIAGNOSIS — C9000 Multiple myeloma not having achieved remission: Secondary | ICD-10-CM

## 2022-10-06 MED ORDER — REVLIMID 25 MG PO CAPS
25.0000 mg | ORAL_CAPSULE | Freq: Every day | ORAL | 0 refills | Status: DC
Start: 2022-10-06 — End: 2022-11-05

## 2022-10-06 NOTE — Telephone Encounter (Signed)
Requested Prescriptions  Pending Prescriptions Disp Refills   DULoxetine (CYMBALTA) 20 MG capsule [Pharmacy Med Name: DULoxetine HCl 20 MG Oral Capsule Delayed Release Particles] 30 capsule 0    Sig: Take 1 capsule by mouth once daily     Psychiatry: Antidepressants - SNRI - duloxetine Passed - 10/05/2022  7:51 AM      Passed - Cr in normal range and within 360 days    Creatinine  Date Value Ref Range Status  10/02/2022 0.90 0.61 - 1.24 mg/dL Final   Creat  Date Value Ref Range Status  06/01/2012 1.23 0.50 - 1.35 mg/dL Final   Creatinine, Urine  Date Value Ref Range Status  02/20/2022 117 mg/dL Final    Comment:    Performed at [facility], [address], [place], [place] [address]         Passed - eGFR is 30 or above and within 360 days    GFR calc Af Amer  Date Value Ref Range Status  06/10/2019 80 >59 mL/min/1.73 Final   GFR, Estimated  Date Value Ref Range Status  10/02/2022 >60 >60 mL/min Final    Comment:    (NOTE) Calculated using the CKD-EPI Creatinine Equation (2021)    eGFR  Date Value Ref Range Status  12/05/2021 76 >59 mL/min/1.73 Final         Passed - Completed PHQ-2 or PHQ-9 in the last 360 days      Passed - Last BP in normal range    BP Readings from Last 1 Encounters:  10/02/22 112/72         Passed - Valid encounter within last 6 months    Recent Outpatient Visits           1 week ago Essential hypertension   [place] [facility], MD   2 months ago Essential hypertension   [place] [facility], MD   3 months ago Essential hypertension    [facility] at [facility], MD   5 months ago Hospital discharge follow-up   [facility], MD   9 months ago Abnormal MRI   [facility], MD       Future Appointments             In 5 months Georganna Skeans, MD [facility]

## 2022-10-08 ENCOUNTER — Inpatient Hospital Stay: Payer: BC Managed Care – PPO

## 2022-10-08 ENCOUNTER — Inpatient Hospital Stay: Payer: BC Managed Care – PPO | Admitting: Physician Assistant

## 2022-10-08 NOTE — Progress Notes (Signed)
Palliative Medicine [facility]  [phone] [phone]   Name: [name] Date: 10/08/2022 MRN: [id number]  DOB: [birth year]  Patient Care Team: Georganna Skeans, MD as PCP - General (Family Medicine) Runell Gess, MD as PCP - Cardiology (Cardiology) Jena Gauss Gerrit Friends, MD as Attending Physician (Gastroenterology)   INTERVAL HISTORY: [name] is a 52 y.o. male with oncologic medical history including IgG Kappa Multiple Myeloma, L1, L2, and L3 vertebral compression fraction s/p kyphoplasty(9/27) and IM nailing of left humeral shaft (9/30), currently undergoing VRD therapy.  Palliative ask to see for symptom management.   SOCIAL HISTORY:     reports that he quit smoking about 9 months ago. His smoking use included cigarettes. He has a 2.50 pack-year smoking history. He has never used smokeless tobacco. He reports current alcohol use. He reports that he does not use drugs.  ADVANCE DIRECTIVES:    CODE STATUS:   PAST MEDICAL HISTORY: Past Medical History:  Diagnosis Date   Allergy    Anemia    Atypical chest pain    Cancer (HCC)    Multiple myeloma with normocytic anemia   GERD (gastroesophageal reflux disease)    Hepatic cyst    Benign by MRI   Hiatal hernia    History of colonic polyps    Hyperlipidemia    Hypertension    Rectal bleeding    Tobacco abuse     ALLERGIES:  has No Known Allergies.  MEDICATIONS:  Current Outpatient Medications  Medication Sig Dispense Refill   acyclovir (ZOVIRAX) 400 MG tablet Take 1 tablet (400 mg total) by mouth 2 (two) times daily. 60 tablet 3   allopurinol (ZYLOPRIM) 300 MG tablet Take 1 tablet (300 mg total) by mouth daily. 30 tablet 3   aspirin EC 81 MG tablet Take 81 mg by mouth daily. Swallow whole.     cetirizine (ZYRTEC) 10 MG tablet Take 10 mg by mouth daily as needed for allergies.     clonazePAM (KLONOPIN) 0.5 MG tablet Take 1 tablet (0.5 mg total) by mouth 2 (two) times  daily as needed for anxiety. 15 tablet 0   dexamethasone (DECADRON) 4 MG tablet Take 5 tablets (20 mg total) by mouth once a week. Take the day after darzalex faspro. Take with breakfast. 20 tablet 11   docusate sodium (COLACE) 100 MG capsule Take 1 capsule (100 mg total) by mouth 2 (two) times daily as needed for mild constipation. 30 capsule 3   doxycycline (VIBRA-TABS) 100 MG tablet Take 1 tablet (100 mg total) by mouth 2 (two) times daily. 28 tablet 0   DULoxetine (CYMBALTA) 20 MG capsule Take 1 capsule by mouth once daily 90 capsule 1   erythromycin ophthalmic ointment Place 1 Application into the right eye at bedtime for 10 days. 3.5 g 1   gabapentin (NEURONTIN) 100 MG capsule Take 1 capsule by mouth twice daily 60 capsule 2   hydrochlorothiazide (HYDRODIURIL) 25 MG tablet Take 25 mg by mouth daily.     lisinopril (ZESTRIL) 40 MG tablet Take 40 mg by mouth daily.     Methocarbamol 1000 MG TABS Take 1,000 mg by mouth every 6 (six) hours as needed. (Patient taking differently: Take 1,000 mg by mouth every 6 (six) hours as needed (muscle spasms).) 45 tablet 1   morphine (MS CONTIN) 30 MG 12 hr tablet Take 1 tablet by mouth every 8 hours. 60 tablet 0   ondansetron (ZOFRAN) 8 MG tablet Take  1 tablet (8 mg total) by mouth every 8 (eight) hours as needed for nausea or vomiting. 60 tablet 3   pantoprazole (PROTONIX) 40 MG tablet Take 1 tablet by mouth twice daily 60 tablet 0   polyethylene glycol powder (GLYCOLAX/MIRALAX) 17 GM/SCOOP powder Take 1 capful (17 g) with water by mouth daily. (Patient taking differently: Take 17 g by mouth daily as needed for mild constipation.) 238 g 0   prochlorperazine (COMPAZINE) 10 MG tablet Take 1 tablet (10 mg total) by mouth every 6 (six) hours as needed for nausea or vomiting. 60 tablet 0   REVLIMID 25 MG capsule Take 1 capsule (25 mg total) by mouth daily. Take one capsule daily x 14 days. None for following 7 days Auth #  [id number]  Date obtained  10/06/22 14  capsule 0   rosuvastatin (CRESTOR) 10 MG tablet Take 1 tablet (10 mg total) by mouth daily. 90 tablet 1   No current facility-administered medications for this visit.    VITAL SIGNS: There were no vitals taken for this visit. There were no vitals filed for this visit.  Estimated body mass index is 34.85 kg/m as calculated from the following:   Height as of 10/02/22: 5\' 9"  (1.753 m).   Weight as of 10/02/22: 236 lb (107 kg).   PERFORMANCE STATUS (ECOG) : 1 - Symptomatic but completely ambulatory  Assessment NAD, ambulatory RRR Normal breathing pattern AAO x4  IMPRESSION:  I saw [name] during his infusion. No acute distress. Is remaining as active as possible. Denies nausea, vomiting, constipation, or diarrhea.   Neoplasm related pain [name] reports pain continues to be well-controlled.    MS Contin 30 mg twice daily.  Continues with Gabapentin 100 mg twice daily, Cymbalta 20 mg, and robaxin as needed. Given controlled pain no changes at this time.  Will continue to closely monitor.  Nausea/Indigestion Resolved. Appetite is doing good.   Constipation Resolved with current regimen  Overall [name] is much improved.  Appreciative of where he is in his health journey.  He knows to contact our office as needed with any concerns.  PLAN:  MS Contin 30 mg daily Gabapentin 100 mg twice daily Cymbalta 20 mg daily  Zofran and Compazine as needed for nausea Robaxin as needed for muscle spasms Miralax daily for constipation I will plan to see patient back in 4-6 weeks in collaboration with other oncology appointments.  Patient knows to contact office sooner if needed.   Patient expressed understanding and was in agreement with this plan. He also understands that He can call the clinic at any time with any questions, concerns, or complaints.    Any controlled substances utilized were prescribed in the context of palliative care. PDMP has been reviewed.    Visit consisted of  counseling and education dealing with the complex and emotionally intense issues of symptom management and palliative care in the setting of serious and potentially life-threatening illness.Greater than 50%  of this time was spent counseling and coordinating care related to the above assessment and plan.  Willette Alma, AGPCNP-BC  Palliative Medicine Team/[facility]  *Please note that this is a verbal dictation therefore any spelling or grammatical errors are due to the "Dragon [employer] One" system interpretation.

## 2022-10-10 ENCOUNTER — Other Ambulatory Visit: Payer: Self-pay | Admitting: Hematology and Oncology

## 2022-10-10 ENCOUNTER — Inpatient Hospital Stay: Payer: BC Managed Care – PPO

## 2022-10-10 ENCOUNTER — Other Ambulatory Visit: Payer: Self-pay

## 2022-10-10 ENCOUNTER — Encounter: Payer: Self-pay | Admitting: Nurse Practitioner

## 2022-10-10 ENCOUNTER — Inpatient Hospital Stay (HOSPITAL_BASED_OUTPATIENT_CLINIC_OR_DEPARTMENT_OTHER): Payer: BC Managed Care – PPO | Admitting: Nurse Practitioner

## 2022-10-10 ENCOUNTER — Inpatient Hospital Stay (HOSPITAL_BASED_OUTPATIENT_CLINIC_OR_DEPARTMENT_OTHER): Payer: BC Managed Care – PPO | Admitting: Physician Assistant

## 2022-10-10 VITALS — BP 119/74 | HR 86 | Temp 97.6°F | Resp 18

## 2022-10-10 DIAGNOSIS — C9000 Multiple myeloma not having achieved remission: Secondary | ICD-10-CM

## 2022-10-10 DIAGNOSIS — I10 Essential (primary) hypertension: Secondary | ICD-10-CM | POA: Diagnosis not present

## 2022-10-10 DIAGNOSIS — Z8719 Personal history of other diseases of the digestive system: Secondary | ICD-10-CM | POA: Diagnosis not present

## 2022-10-10 DIAGNOSIS — Z87891 Personal history of nicotine dependence: Secondary | ICD-10-CM | POA: Diagnosis not present

## 2022-10-10 DIAGNOSIS — Z79899 Other long term (current) drug therapy: Secondary | ICD-10-CM | POA: Diagnosis not present

## 2022-10-10 DIAGNOSIS — Z7982 Long term (current) use of aspirin: Secondary | ICD-10-CM | POA: Diagnosis not present

## 2022-10-10 DIAGNOSIS — E785 Hyperlipidemia, unspecified: Secondary | ICD-10-CM | POA: Diagnosis not present

## 2022-10-10 DIAGNOSIS — K5900 Constipation, unspecified: Secondary | ICD-10-CM | POA: Diagnosis not present

## 2022-10-10 DIAGNOSIS — Z7189 Other specified counseling: Secondary | ICD-10-CM

## 2022-10-10 DIAGNOSIS — Z7961 Long term (current) use of immunomodulator: Secondary | ICD-10-CM | POA: Diagnosis not present

## 2022-10-10 DIAGNOSIS — R5383 Other fatigue: Secondary | ICD-10-CM | POA: Diagnosis not present

## 2022-10-10 DIAGNOSIS — Z5112 Encounter for antineoplastic immunotherapy: Secondary | ICD-10-CM | POA: Diagnosis not present

## 2022-10-10 DIAGNOSIS — R530 Neoplastic (malignant) related fatigue: Secondary | ICD-10-CM | POA: Diagnosis not present

## 2022-10-10 DIAGNOSIS — G893 Neoplasm related pain (acute) (chronic): Secondary | ICD-10-CM

## 2022-10-10 DIAGNOSIS — Z515 Encounter for palliative care: Secondary | ICD-10-CM | POA: Diagnosis not present

## 2022-10-10 DIAGNOSIS — K219 Gastro-esophageal reflux disease without esophagitis: Secondary | ICD-10-CM | POA: Diagnosis not present

## 2022-10-10 DIAGNOSIS — G2581 Restless legs syndrome: Secondary | ICD-10-CM | POA: Diagnosis not present

## 2022-10-10 LAB — CBC WITH DIFFERENTIAL (CANCER CENTER ONLY)
Abs Immature Granulocytes: 0.02 10*3/uL (ref 0.00–0.07)
Basophils Absolute: 0 10*3/uL (ref 0.0–0.1)
Basophils Relative: 0 %
Eosinophils Absolute: 0 10*3/uL (ref 0.0–0.5)
Eosinophils Relative: 1 %
HCT: 39.6 % (ref 39.0–52.0)
Hemoglobin: 13.1 g/dL (ref 13.0–17.0)
Immature Granulocytes: 1 %
Lymphocytes Relative: 28 %
Lymphs Abs: 1 10*3/uL (ref 0.7–4.0)
MCH: 29.1 pg (ref 26.0–34.0)
MCHC: 33.1 g/dL (ref 30.0–36.0)
MCV: 88 fL (ref 80.0–100.0)
Monocytes Absolute: 0.3 10*3/uL (ref 0.1–1.0)
Monocytes Relative: 7 %
Neutro Abs: 2.3 10*3/uL (ref 1.7–7.7)
Neutrophils Relative %: 63 %
Platelet Count: 211 10*3/uL (ref 150–400)
RBC: 4.5 MIL/uL (ref 4.22–5.81)
RDW: 13.7 % (ref 11.5–15.5)
WBC Count: 3.7 10*3/uL — ABNORMAL LOW (ref 4.0–10.5)
nRBC: 0 % (ref 0.0–0.2)

## 2022-10-10 MED ORDER — MONTELUKAST SODIUM 10 MG PO TABS
10.0000 mg | ORAL_TABLET | Freq: Once | ORAL | Status: AC
  Administered 2022-10-10: 10 mg via ORAL
  Filled 2022-10-10: qty 1

## 2022-10-10 MED ORDER — DARATUMUMAB-HYALURONIDASE-FIHJ 1800-30000 MG-UT/15ML ~~LOC~~ SOLN
1800.0000 mg | Freq: Once | SUBCUTANEOUS | Status: AC
  Administered 2022-10-10: 1800 mg via SUBCUTANEOUS
  Filled 2022-10-10: qty 15

## 2022-10-10 MED ORDER — ACETAMINOPHEN 325 MG PO TABS
650.0000 mg | ORAL_TABLET | Freq: Once | ORAL | Status: AC
  Administered 2022-10-10: 650 mg via ORAL
  Filled 2022-10-10: qty 2

## 2022-10-10 MED ORDER — FAMOTIDINE 20 MG PO TABS
20.0000 mg | ORAL_TABLET | Freq: Once | ORAL | Status: AC
  Administered 2022-10-10: 20 mg via ORAL
  Filled 2022-10-10: qty 1

## 2022-10-10 MED ORDER — DENOSUMAB 120 MG/1.7ML ~~LOC~~ SOLN
120.0000 mg | Freq: Once | SUBCUTANEOUS | Status: AC
  Administered 2022-10-10: 120 mg via SUBCUTANEOUS
  Filled 2022-10-10: qty 1.7

## 2022-10-10 MED ORDER — DIPHENHYDRAMINE HCL 25 MG PO CAPS
50.0000 mg | ORAL_CAPSULE | Freq: Once | ORAL | Status: AC
  Administered 2022-10-10: 50 mg via ORAL
  Filled 2022-10-10: qty 2

## 2022-10-10 MED ORDER — DEXAMETHASONE 4 MG PO TABS
20.0000 mg | ORAL_TABLET | Freq: Once | ORAL | Status: AC
  Administered 2022-10-10: 20 mg via ORAL
  Filled 2022-10-10: qty 5

## 2022-10-10 NOTE — Progress Notes (Signed)
Patient reports no upcoming dental procedures at time of xgeva administration.

## 2022-10-10 NOTE — Patient Instructions (Signed)
Dexamethasone Take 5 tablets (20 mg total) by mouth once a week. Take the day after darzalex faspro. Take with breakfast., Starting Mon 09/29/2022, Normal   [facility]  Discharge Instructions: Thank you for choosing [facility] to provide your oncology and hematology care.   If you have a lab appointment with the Cancer Center, please go directly to the Cancer Center and check in at the registration area.   Wear comfortable clothing and clothing appropriate for easy access to any Portacath or PICC line.   We strive to give you quality time with your provider. You may need to reschedule your appointment if you arrive late (15 or more minutes).  Arriving late affects you and other patients whose appointments are after yours.  Also, if you miss three or more appointments without notifying the office, you may be dismissed from the clinic at the provider's discretion.      For prescription refill requests, have your pharmacy contact our office and allow 72 hours for refills to be completed.    Today you received the following chemotherapy and/or immunotherapy agents Darzalex Faspro      To help prevent nausea and vomiting after your treatment, we encourage you to take your nausea medication as directed.  BELOW ARE SYMPTOMS THAT SHOULD BE REPORTED IMMEDIATELY: *FEVER GREATER THAN 100.4 F (38 C) OR HIGHER *CHILLS OR SWEATING *NAUSEA AND VOMITING THAT IS NOT CONTROLLED WITH YOUR NAUSEA MEDICATION *UNUSUAL SHORTNESS OF BREATH *UNUSUAL BRUISING OR BLEEDING *URINARY PROBLEMS (pain or burning when urinating, or frequent urination) *BOWEL PROBLEMS (unusual diarrhea, constipation, pain near the anus) TENDERNESS IN MOUTH AND THROAT WITH OR WITHOUT PRESENCE OF ULCERS (sore throat, sores in mouth, or a toothache) UNUSUAL RASH, SWELLING OR PAIN  UNUSUAL VAGINAL DISCHARGE OR ITCHING   Items with * indicate a potential emergency and should be followed up  as soon as possible or go to the Emergency Department if any problems should occur.  Please show the CHEMOTHERAPY ALERT CARD or IMMUNOTHERAPY ALERT CARD at check-in to the Emergency Department and triage nurse.  Should you have questions after your visit or need to cancel or reschedule your appointment, please contact [facility]  Dept: [phone]  and follow the prompts.  Office hours are 8:00 a.m. to 4:30 p.m. Monday - Friday. Please note that voicemails left after 4:00 p.m. may not be returned until the following business day.  We are closed weekends and major holidays. You have access to a nurse at all times for urgent questions. Please call the main number to the clinic Dept: [phone] and follow the prompts.   For any non-urgent questions, you may also contact your provider using MyChart. We now offer e-Visits for anyone 55 and older to request care online for non-urgent symptoms. For details visit [email or web address].   Also download the MyChart app! Go to the app store, search "MyChart", open the app, select [facility], and log in with your MyChart username and password.  Daratumumab; Hyaluronidase Injection What is this medication? DARATUMUMAB; HYALURONIDASE (dar a toom ue mab; hye al ur ON i dase) treats multiple myeloma, a type of bone marrow cancer. Daratumumab works by blocking a protein that causes cancer cells to grow and multiply. This helps to slow or stop the spread of cancer cells. Hyaluronidase works by increasing the absorption of other medications in the body to help them work better. This medication may also be used treat  amyloidosis, a condition that causes the buildup of a protein (amyloid) in your body. It works by reducing the buildup of this protein, which decreases symptoms. It is a combination medication that contains a monoclonal antibody. This medicine may be used for other purposes; ask your health care provider or  pharmacist if you have questions. COMMON BRAND NAME(S): DARZALEX FASPRO What should I tell my care team before I take this medication? They need to know if you have any of these conditions: Heart disease Infection, such as chickenpox, cold sores, herpes, hepatitis B Lung or breathing disease An unusual or allergic reaction to daratumumab, hyaluronidase, other medications, foods, dyes, or preservatives Pregnant or trying to get pregnant Breast-feeding How should I use this medication? This medication is injected under the skin. It is given by your care team in a hospital or clinic setting. Talk to your care team about the use of this medication in children. Special care may be needed. Overdosage: If you think you have taken too much of this medicine contact a poison control center or emergency room at once. NOTE: This medicine is only for you. Do not share this medicine with others. What if I miss a dose? Keep appointments for follow-up doses. It is important not to miss your dose. Call your care team if you are unable to keep an appointment. What may interact with this medication? Interactions have not been studied. This list may not describe all possible interactions. Give your health care provider a list of all the medicines, herbs, non-prescription drugs, or dietary supplements you use. Also tell them if you smoke, drink alcohol, or use illegal drugs. Some items may interact with your medicine. What should I watch for while using this medication? Your condition will be monitored carefully while you are receiving this medication. This medication can cause serious allergic reactions. To reduce your risk, your care team may give you other medication to take before receiving this one. Be sure to follow the directions from your care team. This medication can affect the results of blood tests to match your blood type. These changes can last for up to 6 months after the final dose. Your care team  will do blood tests to match your blood type before you start treatment. Tell all of your care team that you are being treated with this medication before receiving a blood transfusion. This medication can affect the results of some tests used to determine treatment response; extra tests may be needed to evaluate response. Talk to your care team if you wish to become pregnant or think you are pregnant. This medication can cause serious birth defects if taken during pregnancy and for 3 months after the last dose. A reliable form of contraception is recommended while taking this medication and for 3 months after the last dose. Talk to your care team about effective forms of contraception. Do not breast-feed while taking this medication. What side effects may I notice from receiving this medication? Side effects that you should report to your care team as soon as possible: Allergic reactions--skin rash, itching, hives, swelling of the face, lips, tongue, or throat Heart rhythm changes--fast or irregular heartbeat, dizziness, feeling faint or lightheaded, chest pain, trouble breathing Infection--fever, chills, cough, sore throat, wounds that don't heal, pain or trouble when passing urine, general feeling of discomfort or being unwell Infusion reactions--chest pain, shortness of breath or trouble breathing, feeling faint or lightheaded Sudden eye pain or change in vision such as blurry vision, seeing halos  around lights, vision loss Unusual bruising or bleeding Side effects that usually do not require medical attention (report to your care team if they continue or are bothersome): Constipation Diarrhea Fatigue Nausea Pain, tingling, or numbness in the hands or feet Swelling of the ankles, hands, or feet This list may not describe all possible side effects. Call your doctor for medical advice about side effects. You may report side effects to FDA at 1-800-FDA-1088. Where should I keep my  medication? This medication is given in a hospital or clinic. It will not be stored at home. NOTE: This sheet is a summary. It may not cover all possible information. If you have questions about this medicine, talk to your doctor, pharmacist, or health care provider.  2023 Elsevier/Gold Standard (2021-09-04 00:00:00)

## 2022-10-10 NOTE — Progress Notes (Signed)
Pt tolerated Daralex Faspro well. Observed for 1 hour per protocol.  VSS stable at discharge

## 2022-10-10 NOTE — Progress Notes (Signed)
[facility] [phone]   [phone]  PROGRESS NOTE  Patient Care Team: Georganna Skeans, MD as PCP - General (Family Medicine) Runell Gess, MD as PCP - Cardiology (Cardiology) Jena Gauss Gerrit Friends, MD as Attending Physician (Gastroenterology)  Hematological/Oncological History # IgG Kappa Multiple Myeloma, 1p32/1q21.  01/04/2022: MRI lumbar spine shows diffuse heterogeneous marrow signal with heterogeneous enhancement throughout the thoracolumbar spine.  Additionally, there is subacute-chronic L3 vertebral body compression fraction. 01/14/2022: establish care with Dr. Leonides Schanz  02/10/2022: bone marrow biopsy shows range of plasma cell involvement from 50% on the biopsy to 60% on aspirate smear slides and close to 90% on the clot section for an overall percentage estimated at approximately 80% overall. 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  10/02/2022: Cycle 1 Day 1 of Dara/Rev/Dex   Interval History:  [name] 52 y.o. male for continued management of IgG kappa multiple myeloma. The patient's last visit was on 09/29/2022. In the interim, he has switched to dara/rev/dex in the setting of ocular toxicity from velcade injection. He presents today for Cycle 1, Day 8 of treatment.    On exam today [name] reports his stye and swelling are nearly resolved. He is still taking doxycycline and applying erythromycin drops as prescribed. He  reports his energy and appetite are unchanged. He does have some pain in his back that moves to his abdomen. He is uncertain the underlying cause but is seeing GI and ortho specialists for further evaluation. He denies any nausea, vomiting or abdominal pain. His bowel habits are unchanged without recurrent episodes of diarrhea or constipation. He denies easy bruising or signs of bleeding.   He denies fevers, chills, sweats, shortness of breath, chest pain or cough. He has no other complaints. A full 10 point ROS was otherwise negative.  MEDICAL HISTORY:  Past Medical History:  Diagnosis Date   Allergy    Anemia    Atypical chest pain    Cancer (HCC)    Multiple myeloma with normocytic anemia   GERD (gastroesophageal reflux disease)    Hepatic cyst    Benign by MRI   Hiatal hernia    History of colonic polyps    Hyperlipidemia    Hypertension    Rectal bleeding    Tobacco abuse     SURGICAL HISTORY: Past Surgical History:  Procedure Laterality Date   COLONOSCOPY  01/10/2009     RMR: Normal rectum, normal colon; repeat in 2015 due to FH of colon cancer   COLONOSCOPY N/A 01/09/2014   JYN:[id number] colonic polyps-removed as described   COLONOSCOPY N/A 09/18/2016   Procedure: COLONOSCOPY;  Surgeon: Corbin Ade, MD;  Location: [facility];  Service: Endoscopy;  Laterality: N/A;  730    ESOPHAGOGASTRODUODENOSCOPY  10/04/2007   RMR: Distal esophageal erosions consistent with erosive reflux esophagitis, patulous gastroesophageal junction status post passage of a  Maloney dilator, 56 [place].  Otherwise, unremarkable esophagus.  Hiatal hernia.  Otherwise normal stomach.  Bulbar erosion   ESOPHAGOGASTRODUODENOSCOPY N/A 01/09/2014   Erosive reflux esophagitis. Small hiatal hernia   HUMERUS IM NAIL Left 02/22/2022   Procedure: INTRAMEDULLARY (IM) NAIL HUMERAL;  Surgeon: Yolonda Kida, MD;  Location: [facility] OR;  Service: Orthopedics;  Laterality: Left;   I & D EXTREMITY Left 07/26/2018    Procedure: IRRIGATION AND DEBRIDEMENT EXTREMITY;  Surgeon: Knute Neu, MD;  Location: [facility] OR;  Service: Plastics;  Laterality: Left;   IR BONE TUMOR(S)RF ABLATION  02/05/2022   IR BONE TUMOR(S)RF ABLATION  02/24/2022   IR KYPHO EA ADDL LEVEL THORACIC OR LUMBAR  02/19/2022   IR KYPHO LUMBAR INC FX REDUCE BONE BX UNI/BIL CANNULATION INC/IMAGING  02/05/2022   IR KYPHO LUMBAR INC FX REDUCE BONE BX UNI/BIL CANNULATION INC/IMAGING  02/19/2022   PERCUTANEOUS PINNING Left 07/26/2018   Procedure: PERCUTANEOUS PINNING EXTREMITY;  Surgeon: Knute Neu, MD;  Location: [facility] OR;  Service: Plastics;  Laterality: Left;    SOCIAL HISTORY: Social History   Socioeconomic History   Marital status: Divorced    Spouse name: Not on file   Number of children: 2   Years of education: Not on file   Highest education level: Not on file  Occupational History   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[employer]    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[employer]  Tobacco Use   Smoking status: Former    Packs/day: 0.25    Years: 10.00    Additional pack years: 0.00    Total pack years: 2.50    Types: Cigarettes    Quit date: 12/24/2021    Years since quitting: 0.7   Smokeless tobacco: Never  Vaping Use   Vaping Use: Never used  Substance and Sexual Activity   Alcohol use: Yes    Alcohol/week: 0.0 standard drinks of alcohol    Comment: occasional/wine on the weekends   Drug use: No   Sexual activity: Not on file  Other Topics Concern   Not on file  Social History Narrative   Right handed   Lives alone one story home   Social Determinants of Health   Financial Resource Strain: Not on file  Food Insecurity: No Food Insecurity (07/17/2022)   Hunger Vital Sign    Worried About Running Out of Food in the Last Year: Never true    Ran Out of Food in the Last Year: Never true  Transportation Needs: No Transportation Needs (07/17/2022)   PRAPARE - Transportation    Lack of Transportation (Medical): No    Lack of  Transportation (Non-Medical): No  Physical Activity: Not on file  Stress: Not on file  Social Connections: Not on file  Intimate Partner Violence: Not At Risk (07/17/2022)   Humiliation, Afraid, Rape, and Kick questionnaire    Fear of Current or Ex-Partner: No    Emotionally Abused: No    Physically Abused: No    Sexually Abused: No    FAMILY HISTORY: Family History  Problem Relation Age of Onset   Heart disease Mother    Hypertension Mother    Diabetes Father    Hypertension Father    Colon cancer Sister        passed away from colon cancer, in her 79s   Heart attack Maternal Uncle    Prostate cancer Maternal Uncle    Diabetes Other    Pancreatic cancer Neg Hx    Rectal cancer Neg Hx    Esophageal cancer Neg Hx    Stomach  cancer Neg Hx     ALLERGIES:  has No Known Allergies.  MEDICATIONS:  Current Outpatient Medications  Medication Sig Dispense Refill   acyclovir (ZOVIRAX) 400 MG tablet Take 1 tablet (400 mg total) by mouth 2 (two) times daily. 60 tablet 3   allopurinol (ZYLOPRIM) 300 MG tablet Take 1 tablet (300 mg total) by mouth daily. 30 tablet 3   aspirin EC 81 MG tablet Take 81 mg by mouth daily. Swallow whole.     cetirizine (ZYRTEC) 10 MG tablet Take 10 mg by mouth daily as needed for allergies.     clonazePAM (KLONOPIN) 0.5 MG tablet Take 1 tablet (0.5 mg total) by mouth 2 (two) times daily as needed for anxiety. 15 tablet 0   dexamethasone (DECADRON) 4 MG tablet Take 5 tablets (20 mg total) by mouth once a week. Take the day after darzalex faspro. Take with breakfast. 20 tablet 11   docusate sodium (COLACE) 100 MG capsule Take 1 capsule (100 mg total) by mouth 2 (two) times daily as needed for mild constipation. 30 capsule 3   doxycycline (VIBRA-TABS) 100 MG tablet Take 1 tablet (100 mg total) by mouth 2 (two) times daily. 28 tablet 0   DULoxetine (CYMBALTA) 20 MG capsule Take 1 capsule by mouth once daily 90 capsule 1   erythromycin ophthalmic ointment Place 1  Application into the right eye at bedtime for 10 days. 3.5 g 1   gabapentin (NEURONTIN) 100 MG capsule Take 1 capsule by mouth twice daily 60 capsule 2   hydrochlorothiazide (HYDRODIURIL) 25 MG tablet Take 25 mg by mouth daily.     lisinopril (ZESTRIL) 40 MG tablet Take 40 mg by mouth daily.     Methocarbamol 1000 MG TABS Take 1,000 mg by mouth every 6 (six) hours as needed. (Patient taking differently: Take 1,000 mg by mouth every 6 (six) hours as needed (muscle spasms).) 45 tablet 1   morphine (MS CONTIN) 30 MG 12 hr tablet Take 1 tablet by mouth every 8 hours. 60 tablet 0   ondansetron (ZOFRAN) 8 MG tablet Take 1 tablet (8 mg total) by mouth every 8 (eight) hours as needed for nausea or vomiting. 60 tablet 3   pantoprazole (PROTONIX) 40 MG tablet Take 1 tablet by mouth twice daily 60 tablet 0   polyethylene glycol powder (GLYCOLAX/MIRALAX) 17 GM/SCOOP powder Take 1 capful (17 g) with water by mouth daily. (Patient taking differently: Take 17 g by mouth daily as needed for mild constipation.) 238 g 0   prochlorperazine (COMPAZINE) 10 MG tablet Take 1 tablet (10 mg total) by mouth every 6 (six) hours as needed for nausea or vomiting. 60 tablet 0   REVLIMID 25 MG capsule Take 1 capsule (25 mg total) by mouth daily. Take one capsule daily x 14 days. None for following 7 days Auth #  [id number]  Date obtained  10/06/22 14 capsule 0   rosuvastatin (CRESTOR) 10 MG tablet Take 1 tablet (10 mg total) by mouth daily. 90 tablet 1   No current facility-administered medications for this visit.    REVIEW OF SYSTEMS:   Constitutional: ( - ) fevers, ( - )  chills , ( - ) night sweats Eyes: ( - ) blurriness of vision, ( - ) double vision, ( - ) watery eyes Ears, nose, mouth, throat, and face: ( - ) mucositis, ( - ) sore throat Respiratory: ( - ) cough, ( - ) dyspnea, ( - ) wheezes Cardiovascular: ( - ) palpitation, ( - )  chest discomfort, ( - ) lower extremity swelling Gastrointestinal:  ( - ) nausea, ( - )  heartburn, ( - ) change in bowel habits Skin: ( - ) abnormal skin rashes Lymphatics: ( - ) new lymphadenopathy, ( - ) easy bruising Neurological: ( - ) numbness, ( - ) tingling, ( - ) new weaknesses Behavioral/Psych: ( - ) mood change, ( - ) new changes  All other systems were reviewed with the patient and are negative.  PHYSICAL EXAMINATION: ECOG PERFORMANCE STATUS: 1 - Symptomatic but completely ambulatory  There were no vitals filed for this visit.  There were no vitals filed for this visit.   Day 8, Cycle 1 10/10/22  Temp 97.6 F (36.4 C)  Temp src Oral  Pulse 86  Resp 18  BP 119/74   GENERAL: Well-appearing young African-American male, alert, no distress and comfortable SKIN: skin color, texture, turgor are normal, no rashes or significant lesions EYES: conjunctiva are pink and non-injected, sclera clear. Swelling in right eyelid with surround erythema. No purulent discharge noted.  LUNGS: clear to auscultation and percussion with normal breathing effort HEART: regular rate & rhythm and no murmurs and no lower extremity edema Musculoskeletal: no cyanosis of digits and no clubbing  PSYCH: alert & oriented x 3, fluent speech NEURO: no focal motor/sensory deficits  LABORATORY DATA:  I have reviewed the data as listed    Latest Ref Rng & Units 10/10/2022   11:55 AM 10/02/2022    9:12 AM 09/24/2022   12:24 PM  CBC  WBC 4.0 - 10.5 K/uL 3.7  5.8  5.2   Hemoglobin 13.0 - 17.0 g/dL 16.1  09.6  04.5   Hematocrit 39.0 - 52.0 % 39.6  39.1  38.8   Platelets 150 - 400 K/uL 211  209  186        Latest Ref Rng & Units 10/02/2022    9:12 AM 09/24/2022   12:24 PM 09/17/2022   12:25 PM  CMP  Glucose 70 - 99 mg/dL 409  811  914   BUN 6 - 20 mg/dL 14  12  17    Creatinine 0.61 - 1.24 mg/dL 7.82  9.56  2.13   Sodium 135 - 145 mmol/L 137  134  134   Potassium 3.5 - 5.1 mmol/L 4.1  4.0  4.2   Chloride 98 - 111 mmol/L 103  101  98   CO2 22 - 32 mmol/L 27  28  30    Calcium 8.9 - 10.3  mg/dL 9.5  8.4  9.7   Total Protein 6.5 - 8.1 g/dL 7.5  6.8  7.3   Total Bilirubin 0.3 - 1.2 mg/dL 0.5  0.6  0.6   Alkaline Phos 38 - 126 U/L 60  56  55   AST 15 - 41 U/L 16  18  17    ALT 0 - 44 U/L 16  22  19      Lab Results  Component Value Date   MPROTEIN 0.3 (H) 09/17/2022   MPROTEIN 0.3 (H) 08/26/2022   MPROTEIN 0.5 (H) 08/06/2022   Lab Results  Component Value Date   KPAFRELGTCHN 19.2 09/17/2022   KPAFRELGTCHN 17.1 08/26/2022   KPAFRELGTCHN 17.5 08/06/2022   LAMBDASER 9.8 09/17/2022   LAMBDASER 9.2 08/26/2022   LAMBDASER 9.1 08/06/2022   KAPLAMBRATIO 1.96 (H) 09/17/2022   KAPLAMBRATIO 1.86 (H) 08/26/2022   KAPLAMBRATIO 1.92 (H) 08/06/2022   RADIOGRAPHIC STUDIES: No results found.  ASSESSMENT & PLAN JENCARLOS PLY 52 y.o. male  with medical history significant for IgG kappa multiple myeloma who presents for a follow up visit.   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 -- Recommend VRD chemotherapy with intention of proceeding to transplant. -- Labs at each visit to include CBC, CMP, LDH with monthly restaging labs SPEP and serum free light chains -- Started VRD therapy on 02/26/2022. Held Velcade therapy on 09/24/2022 due to development of ocular toxicity. --Switched to Dara/Rev/Dex on 10/02/2022  PLAN: --Due for Cycle 1, Day 8 of Dara/Rev/Dex.  --Labs today reviewed and adequate for treatment. WBC 3.7, Hgb 13.1, Plt 211.  --Most recent myeloma labs from 09/16/2021 detected M protein at 0.3 g/dL. Kappa light chain 19.2, Lambda light chain 9.8, Ratio 1.96. --Proceed with treatment today without any dose modifications.  --RTC for weekly treatments with biweekly toxicity checks.   #Ocular toxicity--improving: --Secondary to bortezomib (velcade)  --Currently on doxycycline 100 mg BID x 14 days --D/c velcade starting 09/24/2022.   -erythromycin ointment sent to pharmacy and counseled on eye care.  # Leg Discomfort/Restless Leg -- Patient provided with Requip by the emergency department, discontinued due to poor efficacy. --Evaluated by Dr. Barbaraann Cao on 07/28/2022. Recommended to short course of steroids so started prednisone 50 mg daily x 7 days. --Discussed etiologies including iron deficiency so iron levels were checked and was normal.   #Constipation: --Recommend to continue on colace but add miralax as well.   #Pathologic fractures-secondary to MM: --Involving L1, L2 and L3 compression fracture and left humerus fracture.  --Underwent kyphoplasty of L3 fracture on 02/19/2022 --Underwent medullary nailing of left humeral shaft on 02/22/2022 --Underwent kyphoplasty on 06/04/2022.   #Pain Medication -- Per palliative care patient has weaned off MS contin. Currently conitnues on gabapentin 100 mg BID, cymbalta 20 mg and robaxin as needed.  --Under the care of palliative care team.   #Supportive Care -- chemotherapy education complete -- port placement not required -- zofran 8mg  q8H PRN and compazine 10mg  PO q6H for nausea -- acyclovir 400mg  PO BID for VCZ prophylaxis -- allopurinol 300mg  PO daily for TLS prophylaxis -- Dental clearance for Zometa/Denosumab approved.  Next dose is due today.   Orders Placed This Encounter  Procedures   CBC with Differential (Cancer Center Only)    Standing Status:   Future    Standing Expiration Date:   10/16/2023   CBC with Differential (Cancer Center Only)    Standing Status:   Future    Standing Expiration Date:   10/23/2023   CBC with Differential (Cancer Center Only)    Standing Status:   Future    Standing Expiration Date:   10/30/2023   CMP (Cancer Center only)    Standing Status:   Future    Standing Expiration Date:   10/16/2023   CMP (Cancer Center only)    Standing Status:   Future    Standing Expiration Date:   10/23/2023   CMP (Cancer Center only)    Standing  Status:   Future    Standing Expiration Date:   10/30/2023   Kappa/lambda light chains    Standing Status:   Future    Standing Expiration Date:   10/30/2023   Multiple Myeloma Panel (SPEP&IFE w/QIG)    Standing Status:   Future    Standing Expiration Date:   10/30/2023    All questions were answered. The patient knows  to call the clinic with any problems, questions or concerns.  I have spent a total of 30 minutes minutes of face-to-face and non-face-to-face time, preparing to see the patient,performing a medically appropriate examination, counseling and educating the patient, documenting clinical information in the electronic health record,  and care coordination.  Georga Kaufmann PA-C Dept of Hematology and Oncology [facility] Phone: [phone]

## 2022-10-14 ENCOUNTER — Other Ambulatory Visit: Payer: Self-pay | Admitting: Family Medicine

## 2022-10-15 ENCOUNTER — Inpatient Hospital Stay: Payer: BC Managed Care – PPO

## 2022-10-15 ENCOUNTER — Encounter: Payer: Self-pay | Admitting: Orthopaedic Surgery

## 2022-10-17 ENCOUNTER — Inpatient Hospital Stay: Payer: BC Managed Care – PPO | Admitting: Hematology and Oncology

## 2022-10-17 ENCOUNTER — Inpatient Hospital Stay: Payer: BC Managed Care – PPO

## 2022-10-17 VITALS — BP 126/81 | HR 70 | Temp 98.3°F | Resp 16

## 2022-10-17 VITALS — BP 131/85 | HR 76 | Temp 98.2°F | Resp 15 | Wt 238.3 lb

## 2022-10-17 DIAGNOSIS — Z7189 Other specified counseling: Secondary | ICD-10-CM

## 2022-10-17 DIAGNOSIS — R5383 Other fatigue: Secondary | ICD-10-CM | POA: Diagnosis not present

## 2022-10-17 DIAGNOSIS — Z79899 Other long term (current) drug therapy: Secondary | ICD-10-CM | POA: Diagnosis not present

## 2022-10-17 DIAGNOSIS — C9000 Multiple myeloma not having achieved remission: Secondary | ICD-10-CM

## 2022-10-17 DIAGNOSIS — Z87891 Personal history of nicotine dependence: Secondary | ICD-10-CM | POA: Diagnosis not present

## 2022-10-17 DIAGNOSIS — K5900 Constipation, unspecified: Secondary | ICD-10-CM | POA: Diagnosis not present

## 2022-10-17 DIAGNOSIS — E785 Hyperlipidemia, unspecified: Secondary | ICD-10-CM | POA: Diagnosis not present

## 2022-10-17 DIAGNOSIS — G2581 Restless legs syndrome: Secondary | ICD-10-CM | POA: Diagnosis not present

## 2022-10-17 DIAGNOSIS — Z7982 Long term (current) use of aspirin: Secondary | ICD-10-CM | POA: Diagnosis not present

## 2022-10-17 DIAGNOSIS — Z5112 Encounter for antineoplastic immunotherapy: Secondary | ICD-10-CM | POA: Diagnosis not present

## 2022-10-17 DIAGNOSIS — Z8719 Personal history of other diseases of the digestive system: Secondary | ICD-10-CM | POA: Diagnosis not present

## 2022-10-17 DIAGNOSIS — Z7961 Long term (current) use of immunomodulator: Secondary | ICD-10-CM | POA: Diagnosis not present

## 2022-10-17 DIAGNOSIS — K219 Gastro-esophageal reflux disease without esophagitis: Secondary | ICD-10-CM | POA: Diagnosis not present

## 2022-10-17 DIAGNOSIS — I10 Essential (primary) hypertension: Secondary | ICD-10-CM | POA: Diagnosis not present

## 2022-10-17 LAB — CBC WITH DIFFERENTIAL (CANCER CENTER ONLY)
Abs Immature Granulocytes: 0.02 10*3/uL (ref 0.00–0.07)
Basophils Absolute: 0 10*3/uL (ref 0.0–0.1)
Basophils Relative: 0 %
Eosinophils Absolute: 0 10*3/uL (ref 0.0–0.5)
Eosinophils Relative: 1 %
HCT: 39.6 % (ref 39.0–52.0)
Hemoglobin: 13.1 g/dL (ref 13.0–17.0)
Immature Granulocytes: 1 %
Lymphocytes Relative: 36 %
Lymphs Abs: 1.6 10*3/uL (ref 0.7–4.0)
MCH: 28.9 pg (ref 26.0–34.0)
MCHC: 33.1 g/dL (ref 30.0–36.0)
MCV: 87.2 fL (ref 80.0–100.0)
Monocytes Absolute: 0.5 10*3/uL (ref 0.1–1.0)
Monocytes Relative: 12 %
Neutro Abs: 2.2 10*3/uL (ref 1.7–7.7)
Neutrophils Relative %: 50 %
Platelet Count: 212 10*3/uL (ref 150–400)
RBC: 4.54 MIL/uL (ref 4.22–5.81)
RDW: 13.4 % (ref 11.5–15.5)
WBC Count: 4.3 10*3/uL (ref 4.0–10.5)
nRBC: 0 % (ref 0.0–0.2)

## 2022-10-17 LAB — CMP (CANCER CENTER ONLY)
ALT: 24 U/L (ref 0–44)
AST: 20 U/L (ref 15–41)
Albumin: 4.3 g/dL (ref 3.5–5.0)
Alkaline Phosphatase: 57 U/L (ref 38–126)
Anion gap: 8 (ref 5–15)
BUN: 10 mg/dL (ref 6–20)
CO2: 26 mmol/L (ref 22–32)
Calcium: 9.2 mg/dL (ref 8.9–10.3)
Chloride: 101 mmol/L (ref 98–111)
Creatinine: 0.95 mg/dL (ref 0.61–1.24)
GFR, Estimated: >60 mL/min (ref >60)
Glucose, Bld: 130 mg/dL — ABNORMAL HIGH (ref 70–99)
Potassium: 4.2 mmol/L (ref 3.5–5.1)
Sodium: 135 mmol/L (ref 135–145)
Total Bilirubin: 0.6 mg/dL (ref 0.3–1.2)
Total Protein: 7.1 g/dL (ref 6.5–8.1)

## 2022-10-17 MED ORDER — FAMOTIDINE 20 MG PO TABS
20.0000 mg | ORAL_TABLET | Freq: Once | ORAL | Status: AC
  Administered 2022-10-17: 20 mg via ORAL
  Filled 2022-10-17: qty 1

## 2022-10-17 MED ORDER — DEXAMETHASONE 4 MG PO TABS
40.0000 mg | ORAL_TABLET | Freq: Once | ORAL | Status: AC
  Administered 2022-10-17: 40 mg via ORAL
  Filled 2022-10-17: qty 10

## 2022-10-17 MED ORDER — ACETAMINOPHEN 325 MG PO TABS
650.0000 mg | ORAL_TABLET | Freq: Once | ORAL | Status: AC
  Administered 2022-10-17: 650 mg via ORAL
  Filled 2022-10-17: qty 2

## 2022-10-17 MED ORDER — DIPHENHYDRAMINE HCL 25 MG PO CAPS
50.0000 mg | ORAL_CAPSULE | Freq: Once | ORAL | Status: AC
  Administered 2022-10-17: 50 mg via ORAL
  Filled 2022-10-17: qty 2

## 2022-10-17 MED ORDER — DARATUMUMAB-HYALURONIDASE-FIHJ 1800-30000 MG-UT/15ML ~~LOC~~ SOLN
1800.0000 mg | Freq: Once | SUBCUTANEOUS | Status: AC
  Administered 2022-10-17: 1800 mg via SUBCUTANEOUS
  Filled 2022-10-17: qty 15

## 2022-10-17 MED ORDER — MONTELUKAST SODIUM 10 MG PO TABS
10.0000 mg | ORAL_TABLET | Freq: Once | ORAL | Status: AC
  Administered 2022-10-17: 10 mg via ORAL
  Filled 2022-10-17: qty 1

## 2022-10-17 NOTE — Patient Instructions (Signed)
[facility]  Discharge Instructions: Thank you for choosing [facility] to provide your oncology and hematology care.   If you have a lab appointment with the Cancer Center, please go directly to the Cancer Center and check in at the registration area.   Wear comfortable clothing and clothing appropriate for easy access to any Portacath or PICC line.   We strive to give you quality time with your provider. You may need to reschedule your appointment if you arrive late (15 or more minutes).  Arriving late affects you and other patients whose appointments are after yours.  Also, if you miss three or more appointments without notifying the office, you may be dismissed from the clinic at the provider's discretion.      For prescription refill requests, have your pharmacy contact our office and allow 72 hours for refills to be completed.    Today you received the following chemotherapy and/or immunotherapy agent: Daratumumab (Darzalex Faspro)   To help prevent nausea and vomiting after your treatment, we encourage you to take your nausea medication as directed.  BELOW ARE SYMPTOMS THAT SHOULD BE REPORTED IMMEDIATELY: *FEVER GREATER THAN 100.4 F (38 C) OR HIGHER *CHILLS OR SWEATING *NAUSEA AND VOMITING THAT IS NOT CONTROLLED WITH YOUR NAUSEA MEDICATION *UNUSUAL SHORTNESS OF BREATH *UNUSUAL BRUISING OR BLEEDING *URINARY PROBLEMS (pain or burning when urinating, or frequent urination) *BOWEL PROBLEMS (unusual diarrhea, constipation, pain near the anus) TENDERNESS IN MOUTH AND THROAT WITH OR WITHOUT PRESENCE OF ULCERS (sore throat, sores in mouth, or a toothache) UNUSUAL RASH, SWELLING OR PAIN  UNUSUAL VAGINAL DISCHARGE OR ITCHING   Items with * indicate a potential emergency and should be followed up as soon as possible or go to the Emergency Department if any problems should occur.  Please show the CHEMOTHERAPY ALERT CARD or IMMUNOTHERAPY  ALERT CARD at check-in to the Emergency Department and triage nurse.  Should you have questions after your visit or need to cancel or reschedule your appointment, please contact [facility]  Dept: [phone]  and follow the prompts.  Office hours are 8:00 a.m. to 4:30 p.m. Monday - Friday. Please note that voicemails left after 4:00 p.m. may not be returned until the following business day.  We are closed weekends and major holidays. You have access to a nurse at all times for urgent questions. Please call the main number to the clinic Dept: [phone] and follow the prompts.   For any non-urgent questions, you may also contact your provider using MyChart. We now offer e-Visits for anyone 74 and older to request care online for non-urgent symptoms. For details visit [email or web address].   Also download the MyChart app! Go to the app store, search "MyChart", open the app, select [facility], and log in with your MyChart username and password.  Daratumumab; Hyaluronidase Injection What is this medication? DARATUMUMAB; HYALURONIDASE (dar a toom ue mab; hye al ur ON i dase) treats multiple myeloma, a type of bone marrow cancer. Daratumumab works by blocking a protein that causes cancer cells to grow and multiply. This helps to slow or stop the spread of cancer cells. Hyaluronidase works by increasing the absorption of other medications in the body to help them work better. This medication may also be used treat amyloidosis, a condition that causes the buildup of a protein (amyloid) in your body. It works by reducing the buildup of this protein, which decreases symptoms. It is a  combination medication that contains a monoclonal antibody. This medicine may be used for other purposes; ask your health care provider or pharmacist if you have questions. COMMON BRAND NAME(S): DARZALEX FASPRO What should I tell my care team before I take this medication? They need to  know if you have any of these conditions: Heart disease Infection, such as chickenpox, cold sores, herpes, hepatitis B Lung or breathing disease An unusual or allergic reaction to daratumumab, hyaluronidase, other medications, foods, dyes, or preservatives Pregnant or trying to get pregnant Breast-feeding How should I use this medication? This medication is injected under the skin. It is given by your care team in a hospital or clinic setting. Talk to your care team about the use of this medication in children. Special care may be needed. Overdosage: If you think you have taken too much of this medicine contact a poison control center or emergency room at once. NOTE: This medicine is only for you. Do not share this medicine with others. What if I miss a dose? Keep appointments for follow-up doses. It is important not to miss your dose. Call your care team if you are unable to keep an appointment. What may interact with this medication? Interactions have not been studied. This list may not describe all possible interactions. Give your health care provider a list of all the medicines, herbs, non-prescription drugs, or dietary supplements you use. Also tell them if you smoke, drink alcohol, or use illegal drugs. Some items may interact with your medicine. What should I watch for while using this medication? Your condition will be monitored carefully while you are receiving this medication. This medication can cause serious allergic reactions. To reduce your risk, your care team may give you other medication to take before receiving this one. Be sure to follow the directions from your care team. This medication can affect the results of blood tests to match your blood type. These changes can last for up to 6 months after the final dose. Your care team will do blood tests to match your blood type before you start treatment. Tell all of your care team that you are being treated with this medication  before receiving a blood transfusion. This medication can affect the results of some tests used to determine treatment response; extra tests may be needed to evaluate response. Talk to your care team if you wish to become pregnant or think you are pregnant. This medication can cause serious birth defects if taken during pregnancy and for 3 months after the last dose. A reliable form of contraception is recommended while taking this medication and for 3 months after the last dose. Talk to your care team about effective forms of contraception. Do not breast-feed while taking this medication. What side effects may I notice from receiving this medication? Side effects that you should report to your care team as soon as possible: Allergic reactions--skin rash, itching, hives, swelling of the face, lips, tongue, or throat Heart rhythm changes--fast or irregular heartbeat, dizziness, feeling faint or lightheaded, chest pain, trouble breathing Infection--fever, chills, cough, sore throat, wounds that don't heal, pain or trouble when passing urine, general feeling of discomfort or being unwell Infusion reactions--chest pain, shortness of breath or trouble breathing, feeling faint or lightheaded Sudden eye pain or change in vision such as blurry vision, seeing halos around lights, vision loss Unusual bruising or bleeding Side effects that usually do not require medical attention (report to your care team if they continue or are bothersome): Constipation  Diarrhea Fatigue Nausea Pain, tingling, or numbness in the hands or feet Swelling of the ankles, hands, or feet This list may not describe all possible side effects. Call your doctor for medical advice about side effects. You may report side effects to FDA at 1-800-FDA-1088. Where should I keep my medication? This medication is given in a hospital or clinic. It will not be stored at home. NOTE: This sheet is a summary. It may not cover all possible  information. If you have questions about this medicine, talk to your doctor, pharmacist, or health care provider.  2024 [employer]/Gold Standard (2021-09-17 00:00:00)

## 2022-10-17 NOTE — Progress Notes (Signed)
[facility] [phone]   [phone]  PROGRESS NOTE  Patient Care Team: Georganna Skeans, MD as PCP - General (Family Medicine) Runell Gess, MD as PCP - Cardiology (Cardiology) Jena Gauss Gerrit Friends, MD as Attending Physician (Gastroenterology)  Hematological/Oncological History # IgG Kappa Multiple Myeloma, 1p32/1q21.  01/04/2022: MRI lumbar spine shows diffuse heterogeneous marrow signal with heterogeneous enhancement throughout the thoracolumbar spine.  Additionally, there is subacute-chronic L3 vertebral body compression fraction. 01/14/2022: establish care with Dr. Leonides Schanz  02/10/2022: bone marrow biopsy shows range of plasma cell involvement from 50% on the biopsy to 60% on aspirate smear slides and close to 90% on the clot section for an overall percentage estimated at approximately 80% overall. 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  10/02/2022: Cycle 1 Day 1 of Dara/Rev/Dex   Interval History:  [name] 52 y.o. male for continued management of IgG kappa multiple myeloma. The patient's last visit was on 09/29/2022. In the interim, he has switched to dara/rev/dex in the setting of ocular toxicity from velcade injection. He presents today for Cycle 1, Day 15 of treatment.    On exam today Mr. Batterson reports he has been well overall in the interim since her last visit.  He reports the stye on his right eye is steadily improving.  He  has 2 days left of the doxycycline and has been using his erythromycin drops.  He notes he has not noticed any changes since she stopped the Velcade and transition to the Darzalex.  He reports there was a little bit of confusion about his steroid dosing last week.  He notes his energy levels are strong and his appetite is good.  He does have some occasional fatigue though.  He notes that he spacey a little minor bleeding when he brushes his teeth.  He is also going to be evaluated by his gastroenterologist for which we have given clearance for endoscopy evaluation.  He notes that he does have upcoming imaging of his back as well.  He continues to take his morphine twice daily as well as muscle relaxers.  He is not taking any fast acting pain medication at this time.  Overall he is willing and able to proceed with Darzalex chemotherapy at this time.   He denies fevers, chills, sweats, shortness of breath, chest pain or cough. He has no other complaints. A full 10 point ROS was otherwise negative.  MEDICAL HISTORY:  Past Medical History:  Diagnosis Date   Allergy    Anemia    Atypical chest pain    Cancer (HCC)    Multiple myeloma with normocytic anemia   GERD (gastroesophageal reflux disease)    Hepatic cyst    Benign by MRI   Hiatal hernia    History of colonic polyps    Hyperlipidemia    Hypertension    Rectal bleeding    Tobacco abuse     SURGICAL HISTORY: Past Surgical History:  Procedure Laterality Date   COLONOSCOPY  01/10/2009     RMR: Normal rectum, normal colon; repeat in 2015 due to FH of colon cancer   COLONOSCOPY N/A 01/09/2014   [id number] colonic polyps-removed as described   COLONOSCOPY N/A 09/18/2016   Procedure: COLONOSCOPY;  Surgeon: Corbin Ade, MD;  Location: [facility];  Service: Endoscopy;  Laterality: N/A;  730    ESOPHAGOGASTRODUODENOSCOPY  10/04/2007   RMR: Distal esophageal erosions consistent with erosive reflux esophagitis, patulous gastroesophageal  junction status post passage of a  Maloney dilator, 56 [place].  Otherwise, unremarkable esophagus.  Hiatal hernia.  Otherwise normal stomach.  Bulbar erosion   ESOPHAGOGASTRODUODENOSCOPY N/A 01/09/2014   Erosive reflux esophagitis. Small hiatal hernia   HUMERUS IM NAIL Left 02/22/2022   Procedure: INTRAMEDULLARY (IM) NAIL HUMERAL;  Surgeon: Yolonda Kida, MD;  Location: [facility] OR;  Service: Orthopedics;  Laterality: Left;   I & D EXTREMITY Left 07/26/2018   Procedure: IRRIGATION AND DEBRIDEMENT EXTREMITY;  Surgeon: Knute Neu, MD;  Location: [facility] OR;  Service: Plastics;  Laterality: Left;   IR BONE TUMOR(S)RF ABLATION  02/05/2022   IR BONE TUMOR(S)RF ABLATION  02/24/2022   IR KYPHO EA ADDL LEVEL THORACIC OR LUMBAR  02/19/2022   IR KYPHO LUMBAR INC FX REDUCE BONE BX UNI/BIL CANNULATION INC/IMAGING  02/05/2022   IR KYPHO LUMBAR INC FX REDUCE BONE BX UNI/BIL CANNULATION INC/IMAGING  02/19/2022   PERCUTANEOUS PINNING Left 07/26/2018   Procedure: PERCUTANEOUS PINNING EXTREMITY;  Surgeon: Knute Neu, MD;  Location: [facility] OR;  Service: Plastics;  Laterality: Left;    SOCIAL HISTORY: Social History   Socioeconomic History   Marital status: Divorced    Spouse name: Not on file   Number of children: 2   Years of education: Not on file   Highest education level: Not on file  Occupational History   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[employer]    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[employer]  Tobacco Use   Smoking status: Former    Packs/day: 0.25    Years: 10.00    Additional pack years: 0.00    Total pack years: 2.50    Types: Cigarettes    Quit date: 12/24/2021    Years since quitting: 0.8   Smokeless tobacco: Never  Vaping Use   Vaping Use: Never used  Substance and Sexual Activity   Alcohol use: Yes    Alcohol/week: 0.0 standard drinks of alcohol    Comment: occasional/wine on the weekends   Drug use: No   Sexual activity: Not on file  Other Topics Concern   Not on file  Social  History Narrative   Right handed   Lives alone one story home   Social Determinants of Health   Financial Resource Strain: Not on file  Food Insecurity: No Food Insecurity (07/17/2022)   Hunger Vital Sign    Worried About Running Out of Food in the Last Year: Never true    Ran Out of Food in the Last Year: Never true  Transportation Needs: No Transportation Needs (07/17/2022)   PRAPARE - Transportation    Lack of Transportation (Medical): No    Lack of Transportation (Non-Medical): No  Physical Activity: Not on file  Stress: Not on file  Social Connections: Not on file  Intimate Partner Violence: Not At Risk (07/17/2022)   Humiliation, Afraid, Rape, and Kick questionnaire    Fear of Current or Ex-Partner: No    Emotionally Abused: No    Physically Abused:  No    Sexually Abused: No    FAMILY HISTORY: Family History  Problem Relation Age of Onset   Heart disease Mother    Hypertension Mother    Diabetes Father    Hypertension Father    Colon cancer Sister        passed away from colon cancer, in her 69s   Heart attack Maternal Uncle    Prostate cancer Maternal Uncle    Diabetes Other    Pancreatic cancer Neg Hx    Rectal cancer Neg Hx    Esophageal cancer Neg Hx    Stomach cancer Neg Hx     ALLERGIES:  has No Known Allergies.  MEDICATIONS:  Current Outpatient Medications  Medication Sig Dispense Refill   acyclovir (ZOVIRAX) 400 MG tablet Take 1 tablet (400 mg total) by mouth 2 (two) times daily. 60 tablet 3   allopurinol (ZYLOPRIM) 300 MG tablet Take 1 tablet (300 mg total) by mouth daily. 30 tablet 3   aspirin EC 81 MG tablet Take 81 mg by mouth daily. Swallow whole.     cetirizine (ZYRTEC) 10 MG tablet Take 10 mg by mouth daily as needed for allergies.     clonazePAM (KLONOPIN) 0.5 MG tablet Take 1 tablet (0.5 mg total) by mouth 2 (two) times daily as needed for anxiety. 15 tablet 0   dexamethasone (DECADRON) 4 MG tablet Take 5 tablets (20 mg total) by mouth once a  week. Take the day after darzalex faspro. Take with breakfast. 20 tablet 11   docusate sodium (COLACE) 100 MG capsule Take 1 capsule (100 mg total) by mouth 2 (two) times daily as needed for mild constipation. 30 capsule 3   doxycycline (VIBRA-TABS) 100 MG tablet Take 1 tablet (100 mg total) by mouth 2 (two) times daily. 28 tablet 0   DULoxetine (CYMBALTA) 20 MG capsule Take 1 capsule by mouth once daily 90 capsule 1   gabapentin (NEURONTIN) 100 MG capsule Take 1 capsule by mouth twice daily 60 capsule 2   hydrochlorothiazide (HYDRODIURIL) 25 MG tablet TAKE 1 TABLET BY MOUTH ONCE DAILY. GIVEN ENOUGH REFILL TO LAST UNTIL APPT. MUST ATTEND APPT FOR FURTHER REFILLS. 38 tablet 0   lisinopril (ZESTRIL) 40 MG tablet Take 40 mg by mouth daily.     Methocarbamol 1000 MG TABS Take 1,000 mg by mouth every 6 (six) hours as needed. (Patient taking differently: Take 1,000 mg by mouth every 6 (six) hours as needed (muscle spasms).) 45 tablet 1   morphine (MS CONTIN) 30 MG 12 hr tablet Take 1 tablet by mouth every 8 hours. 60 tablet 0   ondansetron (ZOFRAN) 8 MG tablet Take 1 tablet (8 mg total) by mouth every 8 (eight) hours as needed for nausea or vomiting. 60 tablet 3   pantoprazole (PROTONIX) 40 MG tablet Take 1 tablet by mouth twice daily 60 tablet 0   polyethylene glycol powder (GLYCOLAX/MIRALAX) 17 GM/SCOOP powder Take 1 capful (17 g) with water by mouth daily. (Patient taking differently: Take 17 g by mouth daily as needed for mild constipation.) 238 g 0   prochlorperazine (COMPAZINE) 10 MG tablet Take 1 tablet (10 mg total) by mouth every 6 (six) hours as needed for nausea or vomiting. 60 tablet 0   REVLIMID 25 MG capsule Take 1 capsule (25 mg total) by mouth daily. Take one capsule daily x 14 days. None for following 7 days Auth #  [id number]  Date obtained  10/06/22 14 capsule 0   rosuvastatin (CRESTOR)  10 MG tablet Take 1 tablet (10 mg total) by mouth daily. 90 tablet 1   No current facility-administered  medications for this visit.   Facility-Administered Medications Ordered in Other Visits  Medication Dose Route Frequency Provider Last Rate Last Admin   daratumumab-hyaluronidase-fihj (DARZALEX FASPRO) 1800-30000 MG-UT/15ML chemo SQ injection 1,800 mg  1,800 mg Subcutaneous Once Jaci Standard, MD        REVIEW OF SYSTEMS:   Constitutional: ( - ) fevers, ( - )  chills , ( - ) night sweats Eyes: ( - ) blurriness of vision, ( - ) double vision, ( - ) watery eyes Ears, nose, mouth, throat, and face: ( - ) mucositis, ( - ) sore throat Respiratory: ( - ) cough, ( - ) dyspnea, ( - ) wheezes Cardiovascular: ( - ) palpitation, ( - ) chest discomfort, ( - ) lower extremity swelling Gastrointestinal:  ( - ) nausea, ( - ) heartburn, ( - ) change in bowel habits Skin: ( - ) abnormal skin rashes Lymphatics: ( - ) new lymphadenopathy, ( - ) easy bruising Neurological: ( - ) numbness, ( - ) tingling, ( - ) new weaknesses Behavioral/Psych: ( - ) mood change, ( - ) new changes  All other systems were reviewed with the patient and are negative.  PHYSICAL EXAMINATION: ECOG PERFORMANCE STATUS: 1 - Symptomatic but completely ambulatory  Vitals:   10/17/22 1257  BP: 131/85  Pulse: 76  Resp: 15  Temp: 98.2 F (36.8 C)  SpO2: 97%    Filed Weights   10/17/22 1257  Weight: 238 lb 4.8 oz (108.1 kg)     Day 8, Cycle 1 10/10/22  Temp 97.6 F (36.4 C)  Temp src Oral  Pulse 86  Resp 18  BP 119/74   GENERAL: Well-appearing young African-American male, alert, no distress and comfortable SKIN: skin color, texture, turgor are normal, no rashes or significant lesions EYES: conjunctiva are pink and non-injected, sclera clear. Swelling in right eyelid with surround erythema. No purulent discharge noted.  LUNGS: clear to auscultation and percussion with normal breathing effort HEART: regular rate & rhythm and no murmurs and no lower extremity edema Musculoskeletal: no cyanosis of digits and no  clubbing  PSYCH: alert & oriented x 3, fluent speech NEURO: no focal motor/sensory deficits  LABORATORY DATA:  I have reviewed the data as listed    Latest Ref Rng & Units 10/17/2022   12:03 PM 10/10/2022   11:55 AM 10/02/2022    9:12 AM  CBC  WBC 4.0 - 10.5 K/uL 4.3  3.7  5.8   Hemoglobin 13.0 - 17.0 g/dL 16.1  09.6  04.5   Hematocrit 39.0 - 52.0 % 39.6  39.6  39.1   Platelets 150 - 400 K/uL 212  211  209        Latest Ref Rng & Units 10/17/2022   12:03 PM 10/02/2022    9:12 AM 09/24/2022   12:24 PM  CMP  Glucose 70 - 99 mg/dL 409  811  914   BUN 6 - 20 mg/dL 10  14  12    Creatinine 0.61 - 1.24 mg/dL 7.82  9.56  2.13   Sodium 135 - 145 mmol/L 135  137  134   Potassium 3.5 - 5.1 mmol/L 4.2  4.1  4.0   Chloride 98 - 111 mmol/L 101  103  101   CO2 22 - 32 mmol/L 26  27  28    Calcium 8.9 - 10.3  mg/dL 9.2  9.5  8.4   Total Protein 6.5 - 8.1 g/dL 7.1  7.5  6.8   Total Bilirubin 0.3 - 1.2 mg/dL 0.6  0.5  0.6   Alkaline Phos 38 - 126 U/L 57  60  56   AST 15 - 41 U/L 20  16  18    ALT 0 - 44 U/L 24  16  22      Lab Results  Component Value Date   MPROTEIN 0.3 (H) 09/17/2022   MPROTEIN 0.3 (H) 08/26/2022   MPROTEIN 0.5 (H) 08/06/2022   Lab Results  Component Value Date   KPAFRELGTCHN 19.2 09/17/2022   KPAFRELGTCHN 17.1 08/26/2022   KPAFRELGTCHN 17.5 08/06/2022   LAMBDASER 9.8 09/17/2022   LAMBDASER 9.2 08/26/2022   LAMBDASER 9.1 08/06/2022   KAPLAMBRATIO 1.96 (H) 09/17/2022   KAPLAMBRATIO 1.86 (H) 08/26/2022   KAPLAMBRATIO 1.92 (H) 08/06/2022   RADIOGRAPHIC STUDIES: No results found.  ASSESSMENT & PLAN [name] 52 y.o. male with medical history significant for IgG kappa multiple myeloma who presents for a follow up visit.   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 -- Recommend VRD chemotherapy with intention of  proceeding to transplant. -- Labs at each visit to include CBC, CMP, LDH with monthly restaging labs SPEP and serum free light chains -- Started VRD therapy on 02/26/2022. Held Velcade therapy on 09/24/2022 due to development of ocular toxicity. --Switched to Dara/Rev/Dex on 10/02/2022 PLAN: --Due for Cycle 1, Day 15 of Dara/Rev/Dex.  --Labs today reviewed and adequate for treatment. WBC 4.3, hemoglobin 13.1, MCV 87.2, and platelets of 212 --Most recent myeloma labs from 09/16/2021 detected M protein at 0.3 g/dL. Kappa light chain 19.2, Lambda light chain 9.8, Ratio 1.96. --Proceed with treatment today without any dose modifications.  --RTC for weekly treatments with biweekly toxicity checks.   #Ocular toxicity--improving: --Secondary to bortezomib (velcade)  --continue doxycycline 100 mg BID x 14 days.  He will complete this within the next 2 days. --D/c velcade starting 09/24/2022.  -erythromycin ointment sent to pharmacy and counseled on eye care.  # Leg Discomfort/Restless Leg -- Patient provided with Requip by the emergency department, discontinued due to poor efficacy. --Evaluated by Dr. Barbaraann Cao on 07/28/2022. Recommended to short course of steroids so started prednisone 50 mg daily x 7 days. --Discussed etiologies including iron deficiency so iron levels were checked and was normal.   #Constipation: --Recommend to continue on colace but add miralax as well.   #Pathologic fractures-secondary to MM: --Involving L1, L2 and L3 compression fracture and left humerus fracture.  --Underwent kyphoplasty of L3 fracture on 02/19/2022 --Underwent medullary nailing of left humeral shaft on 02/22/2022 --Underwent kyphoplasty on 06/04/2022.   #Pain Medication -- Per palliative care patient has weaned off MS contin. Currently conitnues on gabapentin 100 mg BID, cymbalta 20 mg and robaxin as needed.  --Under the care of palliative care team.   #Supportive Care -- chemotherapy education complete -- port  placement not required -- zofran 8mg  q8H PRN and compazine 10mg  PO q6H for nausea -- acyclovir 400mg  PO BID for VCZ prophylaxis -- allopurinol 300mg  PO daily for TLS prophylaxis -- Dental clearance for Zometa/Denosumab approved.  Next dose is due today.   Orders Placed This Encounter  Procedures   CBC with Differential (Cancer Center Only)    Standing Status:   Future  Standing Expiration Date:   11/06/2023   CMP (Cancer Center only)    Standing Status:   Future    Standing Expiration Date:   11/06/2023   CBC with Differential (Cancer Center Only)    Standing Status:   Future    Standing Expiration Date:   11/13/2023   CMP (Cancer Center only)    Standing Status:   Future    Standing Expiration Date:   11/13/2023   CBC with Differential (Cancer Center Only)    Standing Status:   Future    Standing Expiration Date:   11/20/2023   CMP (Cancer Center only)    Standing Status:   Future    Standing Expiration Date:   11/20/2023   CBC with Differential (Cancer Center Only)    Standing Status:   Future    Standing Expiration Date:   11/27/2023   Multiple Myeloma Panel (SPEP&IFE w/QIG)    Standing Status:   Future    Standing Expiration Date:   11/27/2023   Kappa/lambda light chains    Standing Status:   Future    Standing Expiration Date:   11/27/2023   CMP (Cancer Center only)    Standing Status:   Future    Standing Expiration Date:   11/27/2023   CBC with Differential (Cancer Center Only)    Standing Status:   Future    Standing Expiration Date:   12/11/2023   CMP (Cancer Center only)    Standing Status:   Future    Standing Expiration Date:   12/11/2023   CBC with Differential (Cancer Center Only)    Standing Status:   Future    Standing Expiration Date:   12/25/2023   Multiple Myeloma Panel (SPEP&IFE w/QIG)    Standing Status:   Future    Standing Expiration Date:   12/25/2023   Kappa/lambda light chains    Standing Status:   Future    Standing Expiration Date:   12/25/2023   CMP  (Cancer Center only)    Standing Status:   Future    Standing Expiration Date:   12/25/2023   CBC with Differential (Cancer Center Only)    Standing Status:   Future    Standing Expiration Date:   01/08/2024   CMP (Cancer Center only)    Standing Status:   Future    Standing Expiration Date:   01/08/2024    All questions were answered. The patient knows to call the clinic with any problems, questions or concerns.  I have spent a total of 30 minutes minutes of face-to-face and non-face-to-face time, preparing to see the patient,performing a medically appropriate examination, counseling and educating the patient, documenting clinical information in the electronic health record,  and care coordination.  Ulysees Barns, MD Department of Hematology/Oncology [facility] Phone: [phone] Pager: [phone] Email: [email or web address]

## 2022-10-22 ENCOUNTER — Other Ambulatory Visit: Payer: BC Managed Care – PPO

## 2022-10-22 ENCOUNTER — Ambulatory Visit: Payer: BC Managed Care – PPO | Admitting: Physician Assistant

## 2022-10-22 ENCOUNTER — Ambulatory Visit: Payer: BC Managed Care – PPO

## 2022-10-23 ENCOUNTER — Ambulatory Visit
Admission: RE | Admit: 2022-10-23 | Discharge: 2022-10-23 | Disposition: A | Discharge status: Home / Self Care | Payer: BC Managed Care – PPO | Source: Ambulatory Visit | Attending: Orthopedic Surgery | Admitting: Orthopedic Surgery

## 2022-10-23 DIAGNOSIS — S22070A Wedge compression fracture of T9-T10 vertebra, initial encounter for closed fracture: Secondary | ICD-10-CM

## 2022-10-23 DIAGNOSIS — M6281 Muscle weakness (generalized): Secondary | ICD-10-CM

## 2022-10-24 ENCOUNTER — Inpatient Hospital Stay (HOSPITAL_BASED_OUTPATIENT_CLINIC_OR_DEPARTMENT_OTHER): Payer: BC Managed Care – PPO | Admitting: Physician Assistant

## 2022-10-24 ENCOUNTER — Inpatient Hospital Stay: Payer: BC Managed Care – PPO

## 2022-10-24 VITALS — BP 133/86 | HR 98 | Temp 98.2°F | Resp 17 | Wt 232.5 lb

## 2022-10-24 DIAGNOSIS — E785 Hyperlipidemia, unspecified: Secondary | ICD-10-CM | POA: Diagnosis not present

## 2022-10-24 DIAGNOSIS — C9000 Multiple myeloma not having achieved remission: Secondary | ICD-10-CM

## 2022-10-24 DIAGNOSIS — K219 Gastro-esophageal reflux disease without esophagitis: Secondary | ICD-10-CM | POA: Diagnosis not present

## 2022-10-24 DIAGNOSIS — Z7961 Long term (current) use of immunomodulator: Secondary | ICD-10-CM | POA: Diagnosis not present

## 2022-10-24 DIAGNOSIS — Z79899 Other long term (current) drug therapy: Secondary | ICD-10-CM | POA: Diagnosis not present

## 2022-10-24 DIAGNOSIS — M792 Neuralgia and neuritis, unspecified: Secondary | ICD-10-CM | POA: Diagnosis not present

## 2022-10-24 DIAGNOSIS — Z7189 Other specified counseling: Secondary | ICD-10-CM

## 2022-10-24 DIAGNOSIS — Z87891 Personal history of nicotine dependence: Secondary | ICD-10-CM | POA: Diagnosis not present

## 2022-10-24 DIAGNOSIS — Z8719 Personal history of other diseases of the digestive system: Secondary | ICD-10-CM | POA: Diagnosis not present

## 2022-10-24 DIAGNOSIS — Z7982 Long term (current) use of aspirin: Secondary | ICD-10-CM | POA: Diagnosis not present

## 2022-10-24 DIAGNOSIS — Z5112 Encounter for antineoplastic immunotherapy: Secondary | ICD-10-CM | POA: Diagnosis not present

## 2022-10-24 DIAGNOSIS — K5900 Constipation, unspecified: Secondary | ICD-10-CM | POA: Diagnosis not present

## 2022-10-24 DIAGNOSIS — R5383 Other fatigue: Secondary | ICD-10-CM | POA: Diagnosis not present

## 2022-10-24 DIAGNOSIS — G2581 Restless legs syndrome: Secondary | ICD-10-CM | POA: Diagnosis not present

## 2022-10-24 DIAGNOSIS — I10 Essential (primary) hypertension: Secondary | ICD-10-CM | POA: Diagnosis not present

## 2022-10-24 LAB — CMP (CANCER CENTER ONLY)
ALT: 35 U/L (ref 0–44)
AST: 22 U/L (ref 15–41)
Albumin: 4.7 g/dL (ref 3.5–5.0)
Alkaline Phosphatase: 62 U/L (ref 38–126)
Anion gap: 11 (ref 5–15)
BUN: 18 mg/dL (ref 6–20)
CO2: 25 mmol/L (ref 22–32)
Calcium: 10.5 mg/dL — ABNORMAL HIGH (ref 8.9–10.3)
Chloride: 98 mmol/L (ref 98–111)
Creatinine: 1.15 mg/dL (ref 0.61–1.24)
GFR, Estimated: >60 mL/min (ref >60)
Glucose, Bld: 117 mg/dL — ABNORMAL HIGH (ref 70–99)
Potassium: 4.3 mmol/L (ref 3.5–5.1)
Sodium: 134 mmol/L — ABNORMAL LOW (ref 135–145)
Total Bilirubin: 0.6 mg/dL (ref 0.3–1.2)
Total Protein: 8 g/dL (ref 6.5–8.1)

## 2022-10-24 LAB — CBC WITH DIFFERENTIAL (CANCER CENTER ONLY)
Abs Immature Granulocytes: 0.04 10*3/uL (ref 0.00–0.07)
Basophils Absolute: 0.1 10*3/uL (ref 0.0–0.1)
Basophils Relative: 1 %
Eosinophils Absolute: 0 10*3/uL (ref 0.0–0.5)
Eosinophils Relative: 0 %
HCT: 44.5 % (ref 39.0–52.0)
Hemoglobin: 14.9 g/dL (ref 13.0–17.0)
Immature Granulocytes: 1 %
Lymphocytes Relative: 33 %
Lymphs Abs: 2.7 10*3/uL (ref 0.7–4.0)
MCH: 28.8 pg (ref 26.0–34.0)
MCHC: 33.5 g/dL (ref 30.0–36.0)
MCV: 86.1 fL (ref 80.0–100.0)
Monocytes Absolute: 0.9 10*3/uL (ref 0.1–1.0)
Monocytes Relative: 11 %
Neutro Abs: 4.4 10*3/uL (ref 1.7–7.7)
Neutrophils Relative %: 54 %
Platelet Count: 245 10*3/uL (ref 150–400)
RBC: 5.17 MIL/uL (ref 4.22–5.81)
RDW: 13.2 % (ref 11.5–15.5)
WBC Count: 8 10*3/uL (ref 4.0–10.5)
nRBC: 0 % (ref 0.0–0.2)

## 2022-10-24 MED ORDER — FAMOTIDINE 20 MG PO TABS
20.0000 mg | ORAL_TABLET | Freq: Once | ORAL | Status: AC
  Administered 2022-10-24: 20 mg via ORAL
  Filled 2022-10-24: qty 1

## 2022-10-24 MED ORDER — DARATUMUMAB-HYALURONIDASE-FIHJ 1800-30000 MG-UT/15ML ~~LOC~~ SOLN
1800.0000 mg | Freq: Once | SUBCUTANEOUS | Status: AC
  Administered 2022-10-24: 1800 mg via SUBCUTANEOUS
  Filled 2022-10-24: qty 15

## 2022-10-24 MED ORDER — ACETAMINOPHEN 325 MG PO TABS
650.0000 mg | ORAL_TABLET | Freq: Once | ORAL | Status: AC
  Administered 2022-10-24: 650 mg via ORAL
  Filled 2022-10-24: qty 2

## 2022-10-24 MED ORDER — DIPHENHYDRAMINE HCL 25 MG PO CAPS
50.0000 mg | ORAL_CAPSULE | Freq: Once | ORAL | Status: AC
  Administered 2022-10-24: 50 mg via ORAL
  Filled 2022-10-24: qty 2

## 2022-10-24 MED ORDER — DEXAMETHASONE 4 MG PO TABS
40.0000 mg | ORAL_TABLET | Freq: Once | ORAL | Status: AC
  Administered 2022-10-24: 40 mg via ORAL
  Filled 2022-10-24: qty 10

## 2022-10-24 NOTE — Patient Instructions (Signed)
[facility]  Discharge Instructions: Thank you for choosing [facility] to provide your oncology and hematology care.   If you have a lab appointment with the Cancer Center, please go directly to the Cancer Center and check in at the registration area.   Wear comfortable clothing and clothing appropriate for easy access to any Portacath or PICC line.   We strive to give you quality time with your provider. You may need to reschedule your appointment if you arrive late (15 or more minutes).  Arriving late affects you and other patients whose appointments are after yours.  Also, if you miss three or more appointments without notifying the office, you may be dismissed from the clinic at the provider's discretion.      For prescription refill requests, have your pharmacy contact our office and allow 72 hours for refills to be completed.    Today you received the following chemotherapy and/or immunotherapy agents Darzalex Faspro      To help prevent nausea and vomiting after your treatment, we encourage you to take your nausea medication as directed.  BELOW ARE SYMPTOMS THAT SHOULD BE REPORTED IMMEDIATELY: *FEVER GREATER THAN 100.4 F (38 C) OR HIGHER *CHILLS OR SWEATING *NAUSEA AND VOMITING THAT IS NOT CONTROLLED WITH YOUR NAUSEA MEDICATION *UNUSUAL SHORTNESS OF BREATH *UNUSUAL BRUISING OR BLEEDING *URINARY PROBLEMS (pain or burning when urinating, or frequent urination) *BOWEL PROBLEMS (unusual diarrhea, constipation, pain near the anus) TENDERNESS IN MOUTH AND THROAT WITH OR WITHOUT PRESENCE OF ULCERS (sore throat, sores in mouth, or a toothache) UNUSUAL RASH, SWELLING OR PAIN  UNUSUAL VAGINAL DISCHARGE OR ITCHING   Items with * indicate a potential emergency and should be followed up as soon as possible or go to the Emergency Department if any problems should occur.  Please show the CHEMOTHERAPY ALERT CARD or IMMUNOTHERAPY ALERT CARD at  check-in to the Emergency Department and triage nurse.  Should you have questions after your visit or need to cancel or reschedule your appointment, please contact [facility]  Dept: [phone]  and follow the prompts.  Office hours are 8:00 a.m. to 4:30 p.m. Monday - Friday. Please note that voicemails left after 4:00 p.m. may not be returned until the following business day.  We are closed weekends and major holidays. You have access to a nurse at all times for urgent questions. Please call the main number to the clinic Dept: [phone] and follow the prompts.   For any non-urgent questions, you may also contact your provider using MyChart. We now offer e-Visits for anyone 18 and older to request care online for non-urgent symptoms. For details visit [email or web address].   Also download the MyChart app! Go to the app store, search "MyChart", open the app, select [facility], and log in with your MyChart username and password.  

## 2022-10-24 NOTE — Progress Notes (Signed)
[facility] [phone]   [phone]  PROGRESS NOTE  Patient Care Team: Georganna Skeans, MD as PCP - General (Family Medicine) Runell Gess, MD as PCP - Cardiology (Cardiology) Jena Gauss Gerrit Friends, MD as Attending Physician (Gastroenterology)  Hematological/Oncological History # IgG Kappa Multiple Myeloma, 1p32/1q21.  01/04/2022: MRI lumbar spine shows diffuse heterogeneous marrow signal with heterogeneous enhancement throughout the thoracolumbar spine.  Additionally, there is subacute-chronic L3 vertebral body compression fraction. 01/14/2022: establish care with Dr. Leonides Schanz  02/10/2022: bone marrow biopsy shows range of plasma cell involvement from 50% on the biopsy to 60% on aspirate smear slides and close to 90% on the clot section for an overall percentage estimated at approximately 80% overall. 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  10/02/2022: Cycle 1 Day 1 of Dara/Rev/Dex   Interval History:  [name] 52 y.o. male for continued management of IgG kappa multiple myeloma. The patient's last visit was on 10/17/2022. In the interim, he continues on dara/rev/dex. He presents today for Cycle 1, Day 22 of treatment.    On exam today [name] reports he has noticed more fatigue recently which impacts his ADLs. He is not as active and requires more resting. He still struggles with abdominal pain that oscillates to his back and is  planning to undergo EGD/colonoscopy in the next couple of weeks. He reports worsening reflux while taking protonix twice a day as prescribed. He denies nausea, vomiting or bowel habit changes. He is planning to undergo MRI imaging of his thoracic and lumbar spine to evaluate his back pain. He is taking his pain medication as well as muscle relaxers. Overall he is willing and able to proceed with Darzalex chemotherapy at this time.   He denies fevers, chills, sweats, shortness of breath, chest pain or cough. He has no other complaints. A full 10 point ROS was otherwise negative.  MEDICAL HISTORY:  Past Medical History:  Diagnosis Date   Allergy    Anemia    Atypical chest pain    Cancer (HCC)    Multiple myeloma with normocytic anemia   GERD (gastroesophageal reflux disease)    Hepatic cyst    Benign by MRI   Hiatal hernia    History of colonic polyps    Hyperlipidemia    Hypertension    Rectal bleeding    Tobacco abuse     SURGICAL HISTORY: Past Surgical History:  Procedure Laterality Date   COLONOSCOPY  01/10/2009     RMR: Normal rectum, normal colon; repeat in 2015 due to FH of colon cancer   COLONOSCOPY N/A 01/09/2014   EXB:[id number] colonic polyps-removed as described   COLONOSCOPY N/A 09/18/2016   Procedure: COLONOSCOPY;  Surgeon: Corbin Ade, MD;  Location: [facility];  Service: Endoscopy;  Laterality: N/A;  730    ESOPHAGOGASTRODUODENOSCOPY  10/04/2007   RMR: Distal esophageal erosions consistent with erosive reflux esophagitis, patulous gastroesophageal junction status post passage of a  Maloney dilator, 56 [place].  Otherwise, unremarkable esophagus.  Hiatal hernia.  Otherwise normal stomach.  Bulbar erosion   ESOPHAGOGASTRODUODENOSCOPY N/A 01/09/2014   Erosive reflux esophagitis. Small hiatal hernia   HUMERUS IM NAIL Left 02/22/2022   Procedure: INTRAMEDULLARY (IM) NAIL HUMERAL;  Surgeon: Yolonda Kida, MD;  Location: [facility] OR;  Service: Orthopedics;  Laterality:  Left;   I & D EXTREMITY Left 07/26/2018   Procedure: IRRIGATION AND DEBRIDEMENT EXTREMITY;  Surgeon: Knute Neu, MD;  Location: [facility] OR;  Service: Plastics;  Laterality: Left;   IR BONE TUMOR(S)RF ABLATION  02/05/2022   IR BONE TUMOR(S)RF ABLATION  02/24/2022   IR KYPHO EA ADDL LEVEL THORACIC OR LUMBAR  02/19/2022   IR KYPHO LUMBAR INC FX REDUCE BONE BX UNI/BIL CANNULATION INC/IMAGING  02/05/2022   IR KYPHO LUMBAR INC FX REDUCE BONE BX UNI/BIL CANNULATION INC/IMAGING  02/19/2022   PERCUTANEOUS PINNING Left 07/26/2018   Procedure: PERCUTANEOUS PINNING EXTREMITY;  Surgeon: Knute Neu, MD;  Location: [facility] OR;  Service: Plastics;  Laterality: Left;    SOCIAL HISTORY: Social History   Socioeconomic History   Marital status: Divorced    Spouse name: Not on file   Number of children: 2   Years of education: Not on file   Highest education level: Not on file  Occupational History   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[employer]    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[employer]  Tobacco Use   Smoking status: Former    Packs/day: 0.25    Years: 10.00    Additional pack years: 0.00    Total pack years: 2.50    Types: Cigarettes    Quit date: 12/24/2021    Years since quitting: 0.8   Smokeless tobacco: Never  Vaping Use   Vaping Use: Never used  Substance and Sexual Activity   Alcohol use: Yes    Alcohol/week: 0.0 standard drinks of alcohol    Comment: occasional/wine on the weekends   Drug use: No   Sexual activity: Not on file  Other Topics Concern   Not on file  Social History Narrative   Right handed   Lives alone one story home   Social Determinants of Health   Financial Resource Strain: Not on file  Food Insecurity: No Food Insecurity (07/17/2022)   Hunger Vital Sign    Worried About Running Out of Food in the Last Year: Never true    Ran Out of Food in the Last Year: Never true  Transportation Needs: No Transportation Needs (07/17/2022)   PRAPARE - Transportation    Lack  of Transportation (Medical): No    Lack of Transportation (Non-Medical): No  Physical Activity: Not on file  Stress: Not on file  Social Connections: Not on file  Intimate Partner Violence: Not At Risk (07/17/2022)   Humiliation, Afraid, Rape, and Kick questionnaire    Fear of Current or Ex-Partner: No    Emotionally Abused: No    Physically Abused: No    Sexually Abused: No    FAMILY HISTORY: Family History  Problem Relation Age of Onset   Heart disease Mother    Hypertension Mother    Diabetes Father    Hypertension Father    Colon cancer Sister        passed away from colon cancer, in her 74s   Heart attack Maternal Uncle    Prostate cancer Maternal Uncle    Diabetes Other    Pancreatic cancer Neg Hx    Rectal cancer Neg  Hx    Esophageal cancer Neg Hx    Stomach cancer Neg Hx     ALLERGIES:  has No Known Allergies.  MEDICATIONS:  Current Outpatient Medications  Medication Sig Dispense Refill   acyclovir (ZOVIRAX) 400 MG tablet Take 1 tablet (400 mg total) by mouth 2 (two) times daily. 60 tablet 3   allopurinol (ZYLOPRIM) 300 MG tablet Take 1 tablet (300 mg total) by mouth daily. 30 tablet 3   aspirin EC 81 MG tablet Take 81 mg by mouth daily. Swallow whole.     cetirizine (ZYRTEC) 10 MG tablet Take 10 mg by mouth daily as needed for allergies.     clonazePAM (KLONOPIN) 0.5 MG tablet Take 1 tablet (0.5 mg total) by mouth 2 (two) times daily as needed for anxiety. 15 tablet 0   dexamethasone (DECADRON) 4 MG tablet Take 5 tablets (20 mg total) by mouth once a week. Take the day after darzalex faspro. Take with breakfast. 20 tablet 11   docusate sodium (COLACE) 100 MG capsule Take 1 capsule (100 mg total) by mouth 2 (two) times daily as needed for mild constipation. 30 capsule 3   doxycycline (VIBRA-TABS) 100 MG tablet Take 1 tablet (100 mg total) by mouth 2 (two) times daily. 28 tablet 0   DULoxetine (CYMBALTA) 20 MG capsule Take 1 capsule by mouth once daily 90 capsule 1    gabapentin (NEURONTIN) 100 MG capsule Take 1 capsule by mouth twice daily 60 capsule 2   hydrochlorothiazide (HYDRODIURIL) 25 MG tablet TAKE 1 TABLET BY MOUTH ONCE DAILY. GIVEN ENOUGH REFILL TO LAST UNTIL APPT. MUST ATTEND APPT FOR FURTHER REFILLS. 38 tablet 0   lisinopril (ZESTRIL) 40 MG tablet Take 40 mg by mouth daily.     Methocarbamol 1000 MG TABS Take 1,000 mg by mouth every 6 (six) hours as needed. (Patient taking differently: Take 1,000 mg by mouth every 6 (six) hours as needed (muscle spasms).) 45 tablet 1   morphine (MS CONTIN) 30 MG 12 hr tablet Take 1 tablet by mouth every 8 hours. 60 tablet 0   ondansetron (ZOFRAN) 8 MG tablet Take 1 tablet (8 mg total) by mouth every 8 (eight) hours as needed for nausea or vomiting. 60 tablet 3   pantoprazole (PROTONIX) 40 MG tablet Take 1 tablet by mouth twice daily 60 tablet 0   polyethylene glycol powder (GLYCOLAX/MIRALAX) 17 GM/SCOOP powder Take 1 capful (17 g) with water by mouth daily. (Patient taking differently: Take 17 g by mouth daily as needed for mild constipation.) 238 g 0   prochlorperazine (COMPAZINE) 10 MG tablet Take 1 tablet (10 mg total) by mouth every 6 (six) hours as needed for nausea or vomiting. 60 tablet 0   REVLIMID 25 MG capsule Take 1 capsule (25 mg total) by mouth daily. Take one capsule daily x 14 days. None for following 7 days Auth #  [id number]  Date obtained  10/06/22 14 capsule 0   rosuvastatin (CRESTOR) 10 MG tablet Take 1 tablet (10 mg total) by mouth daily. 90 tablet 1   No current facility-administered medications for this visit.    REVIEW OF SYSTEMS:   Constitutional: ( - ) fevers, ( - )  chills , ( - ) night sweats Eyes: ( - ) blurriness of vision, ( - ) double vision, ( - ) watery eyes Ears, nose, mouth, throat, and face: ( - ) mucositis, ( - ) sore throat Respiratory: ( - ) cough, ( - ) dyspnea, ( - )  wheezes Cardiovascular: ( - ) palpitation, ( - ) chest discomfort, ( - ) lower extremity  swelling Gastrointestinal:  ( - ) nausea, ( +) heartburn, ( - ) change in bowel habits Skin: ( - ) abnormal skin rashes Lymphatics: ( - ) new lymphadenopathy, ( - ) easy bruising Neurological: ( - ) numbness, ( - ) tingling, ( - ) new weaknesses Behavioral/Psych: ( - ) mood change, ( - ) new changes  All other systems were reviewed with the patient and are negative.  PHYSICAL EXAMINATION: ECOG PERFORMANCE STATUS: 1 - Symptomatic but completely ambulatory  There were no vitals filed for this visit.   There were no vitals filed for this visit.   Day 22, Cycle 1 10/24/22  Weight 232 lb 8 oz (105.5 kg)  Temp 98.2 F (36.8 C)  Temp src Oral  Pulse 98  Resp 17  BP 133/86   GENERAL: Well-appearing young African-American male, alert, no distress and comfortable SKIN: skin color, texture, turgor are normal, no rashes or significant lesions EYES: conjunctiva are pink and non-injected, sclera clear. Mild residual swelling in right eyelid with surround erythema. No purulent discharge noted.  LUNGS: clear to auscultation and percussion with normal breathing effort HEART: regular rate & rhythm and no murmurs and no lower extremity edema Musculoskeletal: no cyanosis of digits and no clubbing  PSYCH: alert & oriented x 3, fluent speech NEURO: no focal motor/sensory deficits  LABORATORY DATA:  I have reviewed the data as listed    Latest Ref Rng & Units 10/24/2022   12:00 PM 10/17/2022   12:03 PM 10/10/2022   11:55 AM  CBC  WBC 4.0 - 10.5 K/uL 8.0  4.3  3.7   Hemoglobin 13.0 - 17.0 g/dL 16.1  09.6  04.5   Hematocrit 39.0 - 52.0 % 44.5  39.6  39.6   Platelets 150 - 400 K/uL 245  212  211        Latest Ref Rng & Units 10/24/2022   12:00 PM 10/17/2022   12:03 PM 10/02/2022    9:12 AM  CMP  Glucose 70 - 99 mg/dL 409  811  914   BUN 6 - 20 mg/dL 18  10  14    Creatinine 0.61 - 1.24 mg/dL 7.82  9.56  2.13   Sodium 135 - 145 mmol/L 134  135  137   Potassium 3.5 - 5.1 mmol/L 4.3  4.2  4.1    Chloride 98 - 111 mmol/L 98  101  103   CO2 22 - 32 mmol/L 25  26  27    Calcium 8.9 - 10.3 mg/dL 08.6  9.2  9.5   Total Protein 6.5 - 8.1 g/dL 8.0  7.1  7.5   Total Bilirubin 0.3 - 1.2 mg/dL 0.6  0.6  0.5   Alkaline Phos 38 - 126 U/L 62  57  60   AST 15 - 41 U/L 22  20  16    ALT 0 - 44 U/L 35  24  16     Lab Results  Component Value Date   MPROTEIN 0.3 (H) 09/17/2022   MPROTEIN 0.3 (H) 08/26/2022   MPROTEIN 0.5 (H) 08/06/2022   Lab Results  Component Value Date   KPAFRELGTCHN 19.2 09/17/2022   KPAFRELGTCHN 17.1 08/26/2022   KPAFRELGTCHN 17.5 08/06/2022   LAMBDASER 9.8 09/17/2022   LAMBDASER 9.2 08/26/2022   LAMBDASER 9.1 08/06/2022   KAPLAMBRATIO 1.96 (H) 09/17/2022   KAPLAMBRATIO 1.86 (H) 08/26/2022   KAPLAMBRATIO 1.92 (H)  08/06/2022   RADIOGRAPHIC STUDIES: No results found.  ASSESSMENT & PLAN NISSAN SOLIN 52 y.o. male with medical history significant for IgG kappa multiple myeloma who presents for a follow up visit.   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 -- Recommend VRD chemotherapy with intention of proceeding to transplant. -- Labs at each visit to include CBC, CMP, LDH with monthly restaging labs SPEP and serum free light chains -- Started VRD therapy on 02/26/2022. Held Velcade therapy on 09/24/2022 due to development of ocular toxicity. --Switched to Dara/Rev/Dex on 10/02/2022 PLAN: --Due for Cycle 1, Day 22 of Dara/Rev/Dex.  --Labs today reviewed and adequate for treatment. WBC 8.0, hemoglobin 14.9, MCV 86.1, and platelets of 245 --Most recent myeloma labs from 09/16/2021 detected M protein at 0.3 g/dL. Kappa light chain 19.2, Lambda light chain 9.8, Ratio 1.96. --Proceed with treatment today without any dose modifications.  --RTC for weekly treatments with biweekly toxicity checks.   #Ocular  toxicity--improving: --Secondary to bortezomib (velcade)  --continue doxycycline 100 mg BID x 14 days.  He will complete this within the next 2 days. --D/c velcade starting 09/24/2022.  -erythromycin ointment sent to pharmacy and counseled on eye care.  # Leg Discomfort/Restless Leg -- Patient provided with Requip by the emergency department, discontinued due to poor efficacy. --Evaluated by Dr. Barbaraann Cao on 07/28/2022. Recommended to short course of steroids so started prednisone 50 mg daily x 7 days. --Discussed etiologies including iron deficiency so iron levels were checked and was normal.   #Constipation--improving: --Recommend to continue on colace but add miralax as well.   #Pathologic fractures-secondary to MM: --Involving L1, L2 and L3 compression fracture and left humerus fracture.  --Underwent kyphoplasty of L3 fracture on 02/19/2022 --Underwent medullary nailing of left humeral shaft on 02/22/2022 --Underwent kyphoplasty on 06/04/2022.   #Pain Medication -- Per palliative care patient has weaned off MS contin. Currently conitnues on gabapentin 100 mg BID, cymbalta 20 mg and robaxin as needed.  --Under the care of palliative care team.   #Supportive Care -- chemotherapy education complete -- port placement not required -- zofran 8mg  q8H PRN and compazine 10mg  PO q6H for nausea -- acyclovir 400mg  PO BID for VCZ prophylaxis -- allopurinol 300mg  PO daily for TLS prophylaxis -- Dental clearance for Zometa/Denosumab approved.  Next dose is due today.   Orders Placed This Encounter  Procedures   Methylmalonic acid, serum    Standing Status:   Future    Standing Expiration Date:   10/24/2023   Vitamin B12    Standing Status:   Future    Standing Expiration Date:   10/24/2023    All questions were answered. The patient knows to call the clinic with any problems, questions or concerns.  I have spent a total of 30 minutes minutes of face-to-face and non-face-to-face time, preparing  to see the patient,performing a medically appropriate examination, counseling and educating the patient, documenting clinical information in the electronic health record,  and care coordination.  Georga Kaufmann PA-C Dept of Hematology and Oncology [facility] Phone: [phone]

## 2022-10-24 NOTE — Progress Notes (Signed)
Per Georga Kaufmann, PA-C - okay to proceed with treatment today.

## 2022-10-28 ENCOUNTER — Encounter: Payer: Self-pay | Admitting: Gastroenterology

## 2022-10-29 ENCOUNTER — Ambulatory Visit: Payer: BC Managed Care – PPO

## 2022-10-29 ENCOUNTER — Other Ambulatory Visit: Payer: BC Managed Care – PPO

## 2022-10-31 ENCOUNTER — Inpatient Hospital Stay: Payer: MEDICAID

## 2022-10-31 ENCOUNTER — Inpatient Hospital Stay: Payer: MEDICAID | Admitting: Physician Assistant

## 2022-11-03 ENCOUNTER — Inpatient Hospital Stay: Payer: BC Managed Care – PPO | Attending: Physician Assistant

## 2022-11-03 ENCOUNTER — Inpatient Hospital Stay: Payer: BC Managed Care – PPO | Admitting: Physician Assistant

## 2022-11-03 ENCOUNTER — Inpatient Hospital Stay: Payer: BC Managed Care – PPO

## 2022-11-03 ENCOUNTER — Other Ambulatory Visit: Payer: Self-pay

## 2022-11-03 DIAGNOSIS — C9000 Multiple myeloma not having achieved remission: Secondary | ICD-10-CM

## 2022-11-03 DIAGNOSIS — Z7189 Other specified counseling: Secondary | ICD-10-CM

## 2022-11-03 DIAGNOSIS — N529 Male erectile dysfunction, unspecified: Secondary | ICD-10-CM | POA: Insufficient documentation

## 2022-11-03 DIAGNOSIS — K5900 Constipation, unspecified: Secondary | ICD-10-CM | POA: Insufficient documentation

## 2022-11-03 DIAGNOSIS — Z5112 Encounter for antineoplastic immunotherapy: Secondary | ICD-10-CM | POA: Insufficient documentation

## 2022-11-03 DIAGNOSIS — G2581 Restless legs syndrome: Secondary | ICD-10-CM | POA: Insufficient documentation

## 2022-11-03 DIAGNOSIS — Z87891 Personal history of nicotine dependence: Secondary | ICD-10-CM | POA: Insufficient documentation

## 2022-11-03 DIAGNOSIS — Z800 Family history of malignant neoplasm of digestive organs: Secondary | ICD-10-CM | POA: Insufficient documentation

## 2022-11-03 DIAGNOSIS — Z8042 Family history of malignant neoplasm of prostate: Secondary | ICD-10-CM | POA: Insufficient documentation

## 2022-11-03 DIAGNOSIS — M792 Neuralgia and neuritis, unspecified: Secondary | ICD-10-CM

## 2022-11-03 LAB — CMP (CANCER CENTER ONLY)
ALT: 28 U/L (ref 0–44)
AST: 23 U/L (ref 15–41)
Albumin: 4.3 g/dL (ref 3.5–5.0)
Alkaline Phosphatase: 57 U/L (ref 38–126)
Anion gap: 8 (ref 5–15)
BUN: 11 mg/dL (ref 6–20)
CO2: 25 mmol/L (ref 22–32)
Calcium: 9.2 mg/dL (ref 8.9–10.3)
Chloride: 99 mmol/L (ref 98–111)
Creatinine: 0.82 mg/dL (ref 0.61–1.24)
GFR, Estimated: >60 mL/min (ref >60)
Glucose, Bld: 116 mg/dL — ABNORMAL HIGH (ref 70–99)
Potassium: 4 mmol/L (ref 3.5–5.1)
Sodium: 132 mmol/L — ABNORMAL LOW (ref 135–145)
Total Bilirubin: 0.4 mg/dL (ref 0.3–1.2)
Total Protein: 7 g/dL (ref 6.5–8.1)

## 2022-11-03 LAB — CBC WITH DIFFERENTIAL (CANCER CENTER ONLY)
Abs Immature Granulocytes: 0.03 K/uL (ref 0.00–0.07)
Basophils Absolute: 0 K/uL (ref 0.0–0.1)
Basophils Relative: 1 %
Eosinophils Absolute: 0.1 K/uL (ref 0.0–0.5)
Eosinophils Relative: 2 %
HCT: 40.1 % (ref 39.0–52.0)
Hemoglobin: 13.3 g/dL (ref 13.0–17.0)
Immature Granulocytes: 1 %
Lymphocytes Relative: 30 %
Lymphs Abs: 1.1 K/uL (ref 0.7–4.0)
MCH: 28.8 pg (ref 26.0–34.0)
MCHC: 33.2 g/dL (ref 30.0–36.0)
MCV: 86.8 fL (ref 80.0–100.0)
Monocytes Absolute: 0.3 K/uL (ref 0.1–1.0)
Monocytes Relative: 9 %
Neutro Abs: 2.2 K/uL (ref 1.7–7.7)
Neutrophils Relative %: 57 %
Platelet Count: 192 K/uL (ref 150–400)
RBC: 4.62 MIL/uL (ref 4.22–5.81)
RDW: 13 % (ref 11.5–15.5)
WBC Count: 3.8 K/uL — ABNORMAL LOW (ref 4.0–10.5)
nRBC: 0 % (ref 0.0–0.2)

## 2022-11-03 LAB — VITAMIN B12: Vitamin B-12: 381 pg/mL (ref 180–914)

## 2022-11-03 MED ORDER — DIPHENHYDRAMINE HCL 25 MG PO CAPS
50.0000 mg | ORAL_CAPSULE | Freq: Once | ORAL | Status: AC
  Administered 2022-11-03: 50 mg via ORAL
  Filled 2022-11-03: qty 2

## 2022-11-03 MED ORDER — DEXAMETHASONE 4 MG PO TABS
40.0000 mg | ORAL_TABLET | Freq: Once | ORAL | Status: AC
  Administered 2022-11-03: 40 mg via ORAL
  Filled 2022-11-03: qty 10

## 2022-11-03 MED ORDER — FAMOTIDINE 20 MG PO TABS
20.0000 mg | ORAL_TABLET | Freq: Once | ORAL | Status: AC
  Administered 2022-11-03: 20 mg via ORAL
  Filled 2022-11-03: qty 1

## 2022-11-03 MED ORDER — DARATUMUMAB-HYALURONIDASE-FIHJ 1800-30000 MG-UT/15ML ~~LOC~~ SOLN
1800.0000 mg | Freq: Once | SUBCUTANEOUS | Status: AC
  Administered 2022-11-03: 1800 mg via SUBCUTANEOUS
  Filled 2022-11-03: qty 15

## 2022-11-03 MED ORDER — ERYTHROMYCIN 5 MG/GM OP OINT: 1.0000 | TOPICAL_OINTMENT | Freq: Every day | OPHTHALMIC | 0 refills | Status: AC

## 2022-11-03 MED ORDER — ACETAMINOPHEN 325 MG PO TABS
650.0000 mg | ORAL_TABLET | Freq: Once | ORAL | Status: AC
  Administered 2022-11-03: 650 mg via ORAL
  Filled 2022-11-03: qty 2

## 2022-11-03 NOTE — Progress Notes (Signed)
[facility] [phone]   [phone]  PROGRESS NOTE  Patient Care Team: Georganna Skeans, MD as PCP - General (Family Medicine) Runell Gess, MD as PCP - Cardiology (Cardiology) Jena Gauss Gerrit Friends, MD as Attending Physician (Gastroenterology)  Hematological/Oncological History # IgG Kappa Multiple Myeloma, 1p32/1q21.  01/04/2022: MRI lumbar spine shows diffuse heterogeneous marrow signal with heterogeneous enhancement throughout the thoracolumbar spine.  Additionally, there is subacute-chronic L3 vertebral body compression fraction. 01/14/2022: establish care with Dr. Leonides Schanz  02/10/2022: bone marrow biopsy shows range of plasma cell involvement from 50% on the biopsy to 60% on aspirate smear slides and close to 90% on the clot section for an overall percentage estimated at approximately 80% overall. 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  10/02/2022: Cycle 1 Day 1 of Dara/Rev/Dex 11/03/2022: Cycle 2 Day 1 of Dara/Rev/Dex   Interval History:  [name] 52 y.o. male for continued management of IgG kappa multiple myeloma. The patient's last visit was on 10/24/2022. In the interim, he continues on dara/rev/dex. He presents today for Cycle 2, Day 1 of treatment.    On exam today [name] reports that his stye is still persistent but eyelid is no longer swollen or red. He is compliant with applying warm compresses twice a day and using  erythromycin drops. He reports his energy levels are stable. He reports his back pain has improved after he started to wear his back brace again. He has intermittent episodes of reflux while taking protonix twice a day as prescribed. He denies nausea, vomiting or bowel habit changes. Overall he is willing and able to proceed with Darzalex chemotherapy at this time.   He denies fevers, chills, sweats, shortness of breath, chest pain or cough. He has no other complaints. A full 10 point ROS was otherwise negative.  MEDICAL HISTORY:  Past Medical History:  Diagnosis Date   Allergy    Anemia    Atypical chest pain    Cancer (HCC)    Multiple myeloma with normocytic anemia   GERD (gastroesophageal reflux disease)    Hepatic cyst    Benign by MRI   Hiatal hernia    History of colonic polyps    Hyperlipidemia    Hypertension    Rectal bleeding    Tobacco abuse     SURGICAL HISTORY: Past Surgical History:  Procedure Laterality Date   COLONOSCOPY  01/10/2009     RMR: Normal rectum, normal colon; repeat in 2015 due to FH of colon cancer   COLONOSCOPY N/A 01/09/2014   AVW:[id number] colonic polyps-removed as described   COLONOSCOPY N/A 09/18/2016   Procedure: COLONOSCOPY;  Surgeon: Corbin Ade, MD;  [facility];  Service: Endoscopy;  Laterality: N/A;  730    ESOPHAGOGASTRODUODENOSCOPY  10/04/2007   RMR: Distal esophageal erosions consistent with erosive reflux esophagitis, patulous gastroesophageal junction status post passage of a  Maloney dilator, 56 [place].  Otherwise, unremarkable esophagus.  Hiatal  hernia.  Otherwise normal stomach.  Bulbar erosion   ESOPHAGOGASTRODUODENOSCOPY N/A 01/09/2014   Erosive reflux esophagitis. Small hiatal hernia   HUMERUS IM NAIL Left 02/22/2022   Procedure: INTRAMEDULLARY (IM) NAIL HUMERAL;  Surgeon: Yolonda Kida, MD;  Location: [facility] OR;  Service: Orthopedics;  Laterality: Left;   I & D EXTREMITY Left 07/26/2018   Procedure: IRRIGATION AND  DEBRIDEMENT EXTREMITY;  Surgeon: Knute Neu, MD;  Location: [facility] OR;  Service: Plastics;  Laterality: Left;   IR BONE TUMOR(S)RF ABLATION  02/05/2022   IR BONE TUMOR(S)RF ABLATION  02/24/2022   IR KYPHO EA ADDL LEVEL THORACIC OR LUMBAR  02/19/2022   IR KYPHO LUMBAR INC FX REDUCE BONE BX UNI/BIL CANNULATION INC/IMAGING  02/05/2022   IR KYPHO LUMBAR INC FX REDUCE BONE BX UNI/BIL CANNULATION INC/IMAGING  02/19/2022   PERCUTANEOUS PINNING Left 07/26/2018   Procedure: PERCUTANEOUS PINNING EXTREMITY;  Surgeon: Knute Neu, MD;  Location: [facility] OR;  Service: Plastics;  Laterality: Left;    SOCIAL HISTORY: Social History   Socioeconomic History   Marital status: Divorced    Spouse name: Not on file   Number of children: 2   Years of education: Not on file   Highest education level: Not on file  Occupational History   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[employer]    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[employer]  Tobacco Use   Smoking status: Former    Packs/day: 0.25    Years: 10.00    Additional pack years: 0.00    Total pack years: 2.50    Types: Cigarettes    Quit date: 12/24/2021    Years since quitting: 0.8   Smokeless tobacco: Never  Vaping Use   Vaping Use: Never used  Substance and Sexual Activity   Alcohol use: Yes    Alcohol/week: 0.0 standard drinks of alcohol    Comment: occasional/wine on the weekends   Drug use: No   Sexual activity: Not on file  Other Topics Concern   Not on file  Social History Narrative   Right handed   Lives alone one story home   Social Determinants of Health   Financial Resource Strain: Not on file  Food Insecurity: No Food Insecurity (07/17/2022)   Hunger Vital Sign    Worried About Running Out of Food in the Last Year: Never true    Ran Out of Food in the Last Year: Never true  Transportation Needs: No Transportation Needs (07/17/2022)   PRAPARE - Transportation    Lack of Transportation (Medical): No    Lack of Transportation  (Non-Medical): No  Physical Activity: Not on file  Stress: Not on file  Social Connections: Not on file  Intimate Partner Violence: Not At Risk (07/17/2022)   Humiliation, Afraid, Rape, and Kick questionnaire    Fear of Current or Ex-Partner: No    Emotionally Abused: No    Physically Abused: No    Sexually Abused: No    FAMILY HISTORY: Family History  Problem Relation Age of Onset   Heart disease Mother    Hypertension Mother    Diabetes Father    Hypertension Father    Colon cancer Sister        passed away from colon cancer, in her 72s   Heart attack Maternal Uncle    Prostate cancer Maternal Uncle    Diabetes Other    Pancreatic cancer Neg Hx    Rectal cancer Neg Hx    Esophageal cancer Neg Hx  Stomach cancer Neg Hx     ALLERGIES:  has No Known Allergies.  MEDICATIONS:  Current Outpatient Medications  Medication Sig Dispense Refill   acyclovir (ZOVIRAX) 400 MG tablet Take 1 tablet (400 mg total) by mouth 2 (two) times daily. 60 tablet 3   allopurinol (ZYLOPRIM) 300 MG tablet Take 1 tablet (300 mg total) by mouth daily. 30 tablet 3   aspirin EC 81 MG tablet Take 81 mg by mouth daily. Swallow whole.     cetirizine (ZYRTEC) 10 MG tablet Take 10 mg by mouth daily as needed for allergies.     clonazePAM (KLONOPIN) 0.5 MG tablet Take 1 tablet (0.5 mg total) by mouth 2 (two) times daily as needed for anxiety. 15 tablet 0   docusate sodium (COLACE) 100 MG capsule Take 1 capsule (100 mg total) by mouth 2 (two) times daily as needed for mild constipation. 30 capsule 3   doxycycline (VIBRA-TABS) 100 MG tablet Take 1 tablet (100 mg total) by mouth 2 (two) times daily. 28 tablet 0   DULoxetine (CYMBALTA) 20 MG capsule Take 1 capsule by mouth once daily 90 capsule 1   gabapentin (NEURONTIN) 100 MG capsule Take 1 capsule by mouth twice daily 60 capsule 2   hydrochlorothiazide (HYDRODIURIL) 25 MG tablet TAKE 1 TABLET BY MOUTH ONCE DAILY. GIVEN ENOUGH REFILL TO LAST UNTIL APPT. MUST  ATTEND APPT FOR FURTHER REFILLS. 38 tablet 0   lisinopril (ZESTRIL) 40 MG tablet Take 40 mg by mouth daily.     Methocarbamol 1000 MG TABS Take 1,000 mg by mouth every 6 (six) hours as needed. (Patient taking differently: Take 1,000 mg by mouth every 6 (six) hours as needed (muscle spasms).) 45 tablet 1   morphine (MS CONTIN) 30 MG 12 hr tablet Take 1 tablet by mouth every 8 hours. 60 tablet 0   ondansetron (ZOFRAN) 8 MG tablet Take 1 tablet (8 mg total) by mouth every 8 (eight) hours as needed for nausea or vomiting. 60 tablet 3   pantoprazole (PROTONIX) 40 MG tablet Take 1 tablet by mouth twice daily 60 tablet 0   polyethylene glycol powder (GLYCOLAX/MIRALAX) 17 GM/SCOOP powder Take 1 capful (17 g) with water by mouth daily. (Patient taking differently: Take 17 g by mouth daily as needed for mild constipation.) 238 g 0   prochlorperazine (COMPAZINE) 10 MG tablet Take 1 tablet (10 mg total) by mouth every 6 (six) hours as needed for nausea or vomiting. 60 tablet 0   REVLIMID 25 MG capsule Take 1 capsule (25 mg total) by mouth daily. Take one capsule daily x 14 days. None for following 7 days Auth #  [id number]  Date obtained  10/06/22 14 capsule 0   rosuvastatin (CRESTOR) 10 MG tablet Take 1 tablet (10 mg total) by mouth daily. 90 tablet 1   No current facility-administered medications for this visit.    REVIEW OF SYSTEMS:   Constitutional: ( - ) fevers, ( - )  chills , ( - ) night sweats Eyes: ( - ) blurriness of vision, ( - ) double vision, ( - ) watery eyes Ears, nose, mouth, throat, and face: ( - ) mucositis, ( - ) sore throat Respiratory: ( - ) cough, ( - ) dyspnea, ( - ) wheezes Cardiovascular: ( - ) palpitation, ( - ) chest discomfort, ( - ) lower extremity swelling Gastrointestinal:  ( - ) nausea, ( +) heartburn, ( - ) change in bowel habits Skin: ( - ) abnormal skin rashes Lymphatics: ( - )  new lymphadenopathy, ( - ) easy bruising Neurological: ( - ) numbness, ( - ) tingling, ( - ) new  weaknesses Behavioral/Psych: ( - ) mood change, ( - ) new changes  All other systems were reviewed with the patient and are negative.  PHYSICAL EXAMINATION: ECOG PERFORMANCE STATUS: 1 - Symptomatic but completely ambulatory  Vitals:   11/03/22 0919  BP: (!) 137/93  Pulse: 79  Resp: 18  Temp: 98.7 F (37.1 C)  SpO2: 100%     Filed Weights   11/03/22 0919  Weight: 240 lb 8 oz (109.1 kg)     GENERAL: Well-appearing young African-American male, alert, no distress and comfortable SKIN: skin color, texture, turgor are normal, no rashes or significant lesions EYES: conjunctiva are pink and non-injected, sclera clear. Mild residual swelling in right eyelid with surround erythema. No purulent discharge noted.  LUNGS: clear to auscultation and percussion with normal breathing effort HEART: regular rate & rhythm and no murmurs and no lower extremity edema Musculoskeletal: no cyanosis of digits and no clubbing  PSYCH: alert & oriented x 3, fluent speech NEURO: no focal motor/sensory deficits  LABORATORY DATA:  I have reviewed the data as listed    Latest Ref Rng & Units 11/03/2022    8:24 AM 10/24/2022   12:00 PM 10/17/2022   12:03 PM  CBC  WBC 4.0 - 10.5 K/uL 3.8  8.0  4.3   Hemoglobin 13.0 - 17.0 g/dL 16.1  09.6  04.5   Hematocrit 39.0 - 52.0 % 40.1  44.5  39.6   Platelets 150 - 400 K/uL 192  245  212        Latest Ref Rng & Units 11/03/2022    8:24 AM 10/24/2022   12:00 PM 10/17/2022   12:03 PM  CMP  Glucose 70 - 99 mg/dL 409  811  914   BUN 6 - 20 mg/dL 11  18  10    Creatinine 0.61 - 1.24 mg/dL 7.82  9.56  2.13   Sodium 135 - 145 mmol/L 132  134  135   Potassium 3.5 - 5.1 mmol/L 4.0  4.3  4.2   Chloride 98 - 111 mmol/L 99  98  101   CO2 22 - 32 mmol/L 25  25  26    Calcium 8.9 - 10.3 mg/dL 9.2  08.6  9.2   Total Protein 6.5 - 8.1 g/dL 7.0  8.0  7.1   Total Bilirubin 0.3 - 1.2 mg/dL 0.4  0.6  0.6   Alkaline Phos 38 - 126 U/L 57  62  57   AST 15 - 41 U/L 23  22  20     ALT 0 - 44 U/L 28  35  24     Lab Results  Component Value Date   MPROTEIN 0.3 (H) 09/17/2022   MPROTEIN 0.3 (H) 08/26/2022   MPROTEIN 0.5 (H) 08/06/2022   Lab Results  Component Value Date   KPAFRELGTCHN 19.2 09/17/2022   KPAFRELGTCHN 17.1 08/26/2022   KPAFRELGTCHN 17.5 08/06/2022   LAMBDASER 9.8 09/17/2022   LAMBDASER 9.2 08/26/2022   LAMBDASER 9.1 08/06/2022   KAPLAMBRATIO 1.96 (H) 09/17/2022   KAPLAMBRATIO 1.86 (H) 08/26/2022   KAPLAMBRATIO 1.92 (H) 08/06/2022   RADIOGRAPHIC STUDIES: No results found.  ASSESSMENT & PLAN [name] 52 y.o. male with medical history significant for IgG kappa multiple myeloma who presents for a follow up visit.   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  -- Diagnosis confirmed with lytic lesions of the  spine as well as biopsy-proven plasmacytoma with 80% plasma cell involvement of the bone marrow. -- Recommend VRD chemotherapy with intention of proceeding to transplant. -- Labs at each visit to include CBC, CMP, LDH with monthly restaging labs SPEP and serum free light chains -- Started VRD therapy on 02/26/2022. Held Velcade therapy on 09/24/2022 due to development of ocular toxicity. --Switched to Dara/Rev/Dex on 10/02/2022 PLAN: --Due for Cycle 2, Day 1 of Dara/Rev/Dex.  --Labs today reviewed and adequate for treatment. WBC 3.8, hemoglobin 13.3, MCV 86.8, and platelets of 192 --Most recent myeloma labs from 09/16/2021 detected M protein at 0.3 g/dL. Kappa light chain 19.2, Lambda light chain 9.8, Ratio 1.96. --Proceed with treatment today without any dose modifications.  --RTC for weekly treatments with biweekly toxicity checks.   #Ocular toxicity--improving: --Secondary to bortezomib (velcade)  --completed doxycycline 100 mg BID x 14 days.   --D/c velcade starting 09/24/2022.  --Recommend to continue with warm compresses and use erythromycin ointment   # Leg Discomfort/Restless Leg -- Patient provided with Requip by the emergency  department, discontinued due to poor efficacy. --Evaluated by Dr. Barbaraann Cao on 07/28/2022. Recommended to short course of steroids so started prednisone 50 mg daily x 7 days. --Discussed etiologies including iron deficiency so iron levels were checked and was normal.   #Constipation--improving: --Recommend to continue on colace but add miralax as well.   #Pathologic fractures-secondary to MM: --Involving L1, L2 and L3 compression fracture and left humerus fracture.  --Underwent kyphoplasty of L3 fracture on 02/19/2022 --Underwent medullary nailing of left humeral shaft on 02/22/2022 --Underwent kyphoplasty on 06/04/2022.   #Pain Medication -- Per palliative care patient has weaned off MS contin. Currently conitnues on gabapentin 100 mg BID, cymbalta 20 mg and robaxin as needed.  --Under the care of palliative care team.   #Supportive Care -- chemotherapy education complete -- port placement not required -- zofran 8mg  q8H PRN and compazine 10mg  PO q6H for nausea -- acyclovir 400mg  PO BID for VCZ prophylaxis -- allopurinol 300mg  PO daily for TLS prophylaxis -- Dental clearance for Zometa/Denosumab approved.  Next dose is due today.   No orders of the defined types were placed in this encounter.   All questions were answered. The patient knows to call the clinic with any problems, questions or concerns.  I have spent a total of 30 minutes minutes of face-to-face and non-face-to-face time, preparing to see the patient,performing a medically appropriate examination, counseling and educating the patient, documenting clinical information in the electronic health record,  and care coordination.  Georga Kaufmann PA-C Dept of Hematology and Oncology [facility] at [facility] Phone: [phone]

## 2022-11-05 ENCOUNTER — Ambulatory Visit: Payer: BC Managed Care – PPO

## 2022-11-05 ENCOUNTER — Ambulatory Visit: Payer: BC Managed Care – PPO | Admitting: Hematology and Oncology

## 2022-11-05 ENCOUNTER — Other Ambulatory Visit: Payer: Self-pay | Admitting: *Deleted

## 2022-11-05 ENCOUNTER — Other Ambulatory Visit: Payer: BC Managed Care – PPO

## 2022-11-05 DIAGNOSIS — C9000 Multiple myeloma not having achieved remission: Secondary | ICD-10-CM

## 2022-11-05 LAB — KAPPA/LAMBDA LIGHT CHAINS
Kappa free light chain: 11.2 mg/L (ref 3.3–19.4)
Kappa, lambda light chain ratio: 1.32 (ref 0.26–1.65)
Lambda free light chains: 8.5 mg/L (ref 5.7–26.3)

## 2022-11-05 MED ORDER — REVLIMID 25 MG PO CAPS
25.0000 mg | ORAL_CAPSULE | Freq: Every day | ORAL | 0 refills | Status: DC
Start: 2022-11-05 — End: 2022-11-26

## 2022-11-06 LAB — METHYLMALONIC ACID, SERUM: Methylmalonic Acid, Quantitative: 259 nmol/L (ref 0–378)

## 2022-11-07 ENCOUNTER — Inpatient Hospital Stay: Payer: MEDICAID

## 2022-11-07 ENCOUNTER — Inpatient Hospital Stay: Payer: Self-pay

## 2022-11-09 ENCOUNTER — Other Ambulatory Visit: Payer: BC Managed Care – PPO

## 2022-11-10 ENCOUNTER — Inpatient Hospital Stay: Payer: BC Managed Care – PPO

## 2022-11-10 ENCOUNTER — Other Ambulatory Visit: Payer: Self-pay | Admitting: Nurse Practitioner

## 2022-11-10 VITALS — BP 130/88 | HR 97 | Temp 98.4°F | Resp 18 | Wt 238.2 lb

## 2022-11-10 DIAGNOSIS — C9000 Multiple myeloma not having achieved remission: Secondary | ICD-10-CM

## 2022-11-10 DIAGNOSIS — Z7189 Other specified counseling: Secondary | ICD-10-CM

## 2022-11-10 LAB — CBC WITH DIFFERENTIAL (CANCER CENTER ONLY)
Abs Immature Granulocytes: 0.02 10*3/uL (ref 0.00–0.07)
Basophils Absolute: 0 10*3/uL (ref 0.0–0.1)
Basophils Relative: 1 %
Eosinophils Absolute: 0.1 10*3/uL (ref 0.0–0.5)
Eosinophils Relative: 1 %
HCT: 38.6 % — ABNORMAL LOW (ref 39.0–52.0)
Hemoglobin: 13 g/dL (ref 13.0–17.0)
Immature Granulocytes: 0 %
Lymphocytes Relative: 38 %
Lymphs Abs: 2.4 10*3/uL (ref 0.7–4.0)
MCH: 29.3 pg (ref 26.0–34.0)
MCHC: 33.7 g/dL (ref 30.0–36.0)
MCV: 87.1 fL (ref 80.0–100.0)
Monocytes Absolute: 0.7 10*3/uL (ref 0.1–1.0)
Monocytes Relative: 11 %
Neutro Abs: 3.2 10*3/uL (ref 1.7–7.7)
Neutrophils Relative %: 49 %
Platelet Count: 209 10*3/uL (ref 150–400)
RBC: 4.43 MIL/uL (ref 4.22–5.81)
RDW: 13.1 % (ref 11.5–15.5)
WBC Count: 6.3 10*3/uL (ref 4.0–10.5)
nRBC: 0 % (ref 0.0–0.2)

## 2022-11-10 LAB — CMP (CANCER CENTER ONLY)
ALT: 23 U/L (ref 0–44)
AST: 18 U/L (ref 15–41)
Albumin: 4 g/dL (ref 3.5–5.0)
Alkaline Phosphatase: 55 U/L (ref 38–126)
Anion gap: 8 (ref 5–15)
BUN: 13 mg/dL (ref 6–20)
CO2: 27 mmol/L (ref 22–32)
Calcium: 9.4 mg/dL (ref 8.9–10.3)
Chloride: 99 mmol/L (ref 98–111)
Creatinine: 1.04 mg/dL (ref 0.61–1.24)
GFR, Estimated: >60 mL/min (ref >60)
Glucose, Bld: 125 mg/dL — ABNORMAL HIGH (ref 70–99)
Potassium: 4.2 mmol/L (ref 3.5–5.1)
Sodium: 134 mmol/L — ABNORMAL LOW (ref 135–145)
Total Bilirubin: 0.5 mg/dL (ref 0.3–1.2)
Total Protein: 6.5 g/dL (ref 6.5–8.1)

## 2022-11-10 LAB — MULTIPLE MYELOMA PANEL, SERUM
Albumin SerPl Elph-Mcnc: 3.6 g/dL (ref 2.9–4.4)
Albumin/Glob SerPl: 1.3 (ref 0.7–1.7)
Alpha 1: 0.2 g/dL (ref 0.0–0.4)
Alpha2 Glob SerPl Elph-Mcnc: 0.9 g/dL (ref 0.4–1.0)
B-Globulin SerPl Elph-Mcnc: 1 g/dL (ref 0.7–1.3)
Gamma Glob SerPl Elph-Mcnc: 0.6 g/dL (ref 0.4–1.8)
Globulin, Total: 2.8 g/dL (ref 2.2–3.9)
IgA: 33 mg/dL — ABNORMAL LOW (ref 90–386)
IgG (Immunoglobin G), Serum: 685 mg/dL (ref 603–1613)
IgM (Immunoglobulin M), Srm: 31 mg/dL (ref 20–172)
M Protein SerPl Elph-Mcnc: 0.2 g/dL — ABNORMAL HIGH
Total Protein ELP: 6.4 g/dL (ref 6.0–8.5)

## 2022-11-10 MED ORDER — DARATUMUMAB-HYALURONIDASE-FIHJ 1800-30000 MG-UT/15ML ~~LOC~~ SOLN
1800.0000 mg | Freq: Once | SUBCUTANEOUS | Status: AC
  Administered 2022-11-10: 1800 mg via SUBCUTANEOUS
  Filled 2022-11-10: qty 15

## 2022-11-10 MED ORDER — ACETAMINOPHEN 325 MG PO TABS
650.0000 mg | ORAL_TABLET | Freq: Once | ORAL | Status: AC
  Administered 2022-11-10: 650 mg via ORAL
  Filled 2022-11-10: qty 2

## 2022-11-10 MED ORDER — DIPHENHYDRAMINE HCL 25 MG PO CAPS
50.0000 mg | ORAL_CAPSULE | Freq: Once | ORAL | Status: AC
  Administered 2022-11-10: 50 mg via ORAL
  Filled 2022-11-10: qty 2

## 2022-11-10 MED ORDER — FAMOTIDINE 20 MG PO TABS
20.0000 mg | ORAL_TABLET | Freq: Once | ORAL | Status: AC
  Administered 2022-11-10: 20 mg via ORAL
  Filled 2022-11-10: qty 1

## 2022-11-10 MED ORDER — DEXAMETHASONE 4 MG PO TABS
40.0000 mg | ORAL_TABLET | Freq: Once | ORAL | Status: AC
  Administered 2022-11-10: 40 mg via ORAL
  Filled 2022-11-10: qty 10

## 2022-11-10 MED ORDER — DENOSUMAB 120 MG/1.7ML ~~LOC~~ SOLN
120.0000 mg | Freq: Once | SUBCUTANEOUS | Status: AC
  Administered 2022-11-10: 120 mg via SUBCUTANEOUS
  Filled 2022-11-10: qty 1.7

## 2022-11-10 NOTE — Patient Instructions (Signed)
[facility]  Discharge Instructions: Thank you for choosing [facility] to provide your oncology and hematology care.   If you have a lab appointment with the Cancer Center, please go directly to the Cancer Center and check in at the registration area.   Wear comfortable clothing and clothing appropriate for easy access to any Portacath or PICC line.   We strive to give you quality time with your provider. You may need to reschedule your appointment if you arrive late (15 or more minutes).  Arriving late affects you and other patients whose appointments are after yours.  Also, if you miss three or more appointments without notifying the office, you may be dismissed from the clinic at the provider's discretion.      For prescription refill requests, have your pharmacy contact our office and allow 72 hours for refills to be completed.    Today you received the following chemotherapy and/or immunotherapy agents Darzalex Faspro      To help prevent nausea and vomiting after your treatment, we encourage you to take your nausea medication as directed.  BELOW ARE SYMPTOMS THAT SHOULD BE REPORTED IMMEDIATELY: *FEVER GREATER THAN 100.4 F (38 C) OR HIGHER *CHILLS OR SWEATING *NAUSEA AND VOMITING THAT IS NOT CONTROLLED WITH YOUR NAUSEA MEDICATION *UNUSUAL SHORTNESS OF BREATH *UNUSUAL BRUISING OR BLEEDING *URINARY PROBLEMS (pain or burning when urinating, or frequent urination) *BOWEL PROBLEMS (unusual diarrhea, constipation, pain near the anus) TENDERNESS IN MOUTH AND THROAT WITH OR WITHOUT PRESENCE OF ULCERS (sore throat, sores in mouth, or a toothache) UNUSUAL RASH, SWELLING OR PAIN  UNUSUAL VAGINAL DISCHARGE OR ITCHING   Items with * indicate a potential emergency and should be followed up as soon as possible or go to the Emergency Department if any problems should occur.  Please show the CHEMOTHERAPY ALERT CARD or IMMUNOTHERAPY ALERT CARD at  check-in to the Emergency Department and triage nurse.  Should you have questions after your visit or need to cancel or reschedule your appointment, please contact [facility]  Dept: [phone]  and follow the prompts.  Office hours are 8:00 a.m. to 4:30 p.m. Monday - Friday. Please note that voicemails left after 4:00 p.m. may not be returned until the following business day.  We are closed weekends and major holidays. You have access to a nurse at all times for urgent questions. Please call the main number to the clinic Dept: [phone] and follow the prompts.   For any non-urgent questions, you may also contact your provider using MyChart. We now offer e-Visits for anyone 18 and older to request care online for non-urgent symptoms. For details visit [email or web address].   Also download the MyChart app! Go to the app store, search "MyChart", open the app, select [place], and log in with your MyChart username and password.  

## 2022-11-11 ENCOUNTER — Telehealth: Payer: Self-pay

## 2022-11-11 NOTE — Telephone Encounter (Signed)
-----   Message from Shanon Ace, PA-C sent at 11/11/2022  2:22 PM EDT ----- Regarding: lab results [name],  Can you please call patient to let him know his CBC and CMP from yesterday were normal. His next appointment is 06/24 to see Dr. Leonides Schanz, labs and treatment.  Thanks, Jae Dire

## 2022-11-11 NOTE — Telephone Encounter (Signed)
Pt advised.

## 2022-11-12 ENCOUNTER — Ambulatory Visit: Payer: BC Managed Care – PPO

## 2022-11-12 ENCOUNTER — Encounter: Payer: BC Managed Care – PPO | Admitting: Gastroenterology

## 2022-11-12 ENCOUNTER — Other Ambulatory Visit: Payer: BC Managed Care – PPO

## 2022-11-13 ENCOUNTER — Other Ambulatory Visit: Payer: BC Managed Care – PPO

## 2022-11-13 ENCOUNTER — Encounter: Payer: Self-pay | Admitting: *Deleted

## 2022-11-13 ENCOUNTER — Ambulatory Visit: Payer: BC Managed Care – PPO

## 2022-11-13 ENCOUNTER — Ambulatory Visit: Payer: BC Managed Care – PPO | Admitting: Hematology and Oncology

## 2022-11-13 ENCOUNTER — Telehealth: Payer: Self-pay | Admitting: Gastroenterology

## 2022-11-13 NOTE — Telephone Encounter (Signed)
Updated instructions sent to MyChart as per patients instructions.

## 2022-11-13 NOTE — Telephone Encounter (Signed)
PT rescheduled egd/colon to 7/24 @2  and needs updated instructions posted to mychart.

## 2022-11-14 ENCOUNTER — Encounter: Payer: BC Managed Care – PPO | Admitting: Gastroenterology

## 2022-11-14 ENCOUNTER — Other Ambulatory Visit: Payer: Self-pay | Admitting: Nurse Practitioner

## 2022-11-14 ENCOUNTER — Other Ambulatory Visit (HOSPITAL_COMMUNITY): Payer: Self-pay

## 2022-11-14 ENCOUNTER — Other Ambulatory Visit: Payer: Self-pay | Admitting: Physician Assistant

## 2022-11-14 DIAGNOSIS — Z515 Encounter for palliative care: Secondary | ICD-10-CM

## 2022-11-14 DIAGNOSIS — C9000 Multiple myeloma not having achieved remission: Secondary | ICD-10-CM

## 2022-11-14 DIAGNOSIS — G893 Neoplasm related pain (acute) (chronic): Secondary | ICD-10-CM

## 2022-11-14 DIAGNOSIS — K30 Functional dyspepsia: Secondary | ICD-10-CM

## 2022-11-14 MED ORDER — MORPHINE SULFATE ER 30 MG PO TBCR
30.0000 mg | EXTENDED_RELEASE_TABLET | Freq: Two times a day (BID) | ORAL | 0 refills | Status: DC
Start: 2022-11-14 — End: 2023-01-29

## 2022-11-14 NOTE — Telephone Encounter (Signed)
Refill sent to pharmacy per patient's request. 

## 2022-11-14 NOTE — Telephone Encounter (Signed)
Pt called for refill of pain medication, pt reported taking medication every 12 hours as prescribed and discussed in previous appt with Lowella Bandy, NP. Refill sent to preferred pharmacy.

## 2022-11-14 NOTE — Telephone Encounter (Signed)
Refill sent to pharmacy per patient's request. I have reviewed the PDMP during this encounter.  

## 2022-11-16 ENCOUNTER — Other Ambulatory Visit: Payer: BC Managed Care – PPO

## 2022-11-17 ENCOUNTER — Other Ambulatory Visit: Payer: Self-pay | Admitting: Family Medicine

## 2022-11-17 ENCOUNTER — Inpatient Hospital Stay: Payer: BC Managed Care – PPO | Admitting: Hematology and Oncology

## 2022-11-17 ENCOUNTER — Inpatient Hospital Stay: Payer: BC Managed Care – PPO

## 2022-11-17 ENCOUNTER — Other Ambulatory Visit: Payer: Self-pay

## 2022-11-17 VITALS — BP 137/95 | HR 82 | Temp 97.5°F | Resp 16 | Wt 241.3 lb

## 2022-11-17 DIAGNOSIS — C9000 Multiple myeloma not having achieved remission: Secondary | ICD-10-CM | POA: Diagnosis not present

## 2022-11-17 DIAGNOSIS — Z7189 Other specified counseling: Secondary | ICD-10-CM | POA: Diagnosis not present

## 2022-11-17 LAB — CBC WITH DIFFERENTIAL (CANCER CENTER ONLY)
Abs Immature Granulocytes: 0.05 10*3/uL (ref 0.00–0.07)
Basophils Absolute: 0 10*3/uL (ref 0.0–0.1)
Basophils Relative: 1 %
Eosinophils Absolute: 0.1 10*3/uL (ref 0.0–0.5)
Eosinophils Relative: 1 %
HCT: 38.2 % — ABNORMAL LOW (ref 39.0–52.0)
Hemoglobin: 12.7 g/dL — ABNORMAL LOW (ref 13.0–17.0)
Immature Granulocytes: 1 %
Lymphocytes Relative: 40 %
Lymphs Abs: 2.6 10*3/uL (ref 0.7–4.0)
MCH: 29.1 pg (ref 26.0–34.0)
MCHC: 33.2 g/dL (ref 30.0–36.0)
MCV: 87.6 fL (ref 80.0–100.0)
Monocytes Absolute: 0.7 10*3/uL (ref 0.1–1.0)
Monocytes Relative: 11 %
Neutro Abs: 3 10*3/uL (ref 1.7–7.7)
Neutrophils Relative %: 46 %
Platelet Count: 207 10*3/uL (ref 150–400)
RBC: 4.36 MIL/uL (ref 4.22–5.81)
RDW: 13.6 % (ref 11.5–15.5)
WBC Count: 6.4 10*3/uL (ref 4.0–10.5)
nRBC: 0 % (ref 0.0–0.2)

## 2022-11-17 LAB — CMP (CANCER CENTER ONLY)
ALT: 17 U/L (ref 0–44)
AST: 14 U/L — ABNORMAL LOW (ref 15–41)
Albumin: 3.9 g/dL (ref 3.5–5.0)
Alkaline Phosphatase: 48 U/L (ref 38–126)
Anion gap: 7 (ref 5–15)
BUN: 15 mg/dL (ref 6–20)
CO2: 27 mmol/L (ref 22–32)
Calcium: 9 mg/dL (ref 8.9–10.3)
Chloride: 103 mmol/L (ref 98–111)
Creatinine: 0.95 mg/dL (ref 0.61–1.24)
GFR, Estimated: >60 mL/min (ref >60)
Glucose, Bld: 112 mg/dL — ABNORMAL HIGH (ref 70–99)
Potassium: 3.9 mmol/L (ref 3.5–5.1)
Sodium: 137 mmol/L (ref 135–145)
Total Bilirubin: 0.5 mg/dL (ref 0.3–1.2)
Total Protein: 6.5 g/dL (ref 6.5–8.1)

## 2022-11-17 MED ORDER — DARATUMUMAB-HYALURONIDASE-FIHJ 1800-30000 MG-UT/15ML ~~LOC~~ SOLN
1800.0000 mg | Freq: Once | SUBCUTANEOUS | Status: AC
  Administered 2022-11-17: 1800 mg via SUBCUTANEOUS
  Filled 2022-11-17: qty 15

## 2022-11-17 MED ORDER — ACETAMINOPHEN 325 MG PO TABS
650.0000 mg | ORAL_TABLET | Freq: Once | ORAL | Status: AC
  Administered 2022-11-17: 650 mg via ORAL
  Filled 2022-11-17: qty 2

## 2022-11-17 MED ORDER — DEXAMETHASONE 4 MG PO TABS
40.0000 mg | ORAL_TABLET | Freq: Once | ORAL | Status: AC
  Administered 2022-11-17: 40 mg via ORAL
  Filled 2022-11-17: qty 10

## 2022-11-17 MED ORDER — DIPHENHYDRAMINE HCL 25 MG PO CAPS
50.0000 mg | ORAL_CAPSULE | Freq: Once | ORAL | Status: AC
  Administered 2022-11-17: 50 mg via ORAL
  Filled 2022-11-17: qty 2

## 2022-11-17 MED ORDER — SILDENAFIL CITRATE 50 MG PO TABS: 50.0000 mg | ORAL_TABLET | Freq: Every day | ORAL | 0 refills | Status: DC | PRN

## 2022-11-17 MED ORDER — FAMOTIDINE 20 MG PO TABS
20.0000 mg | ORAL_TABLET | Freq: Once | ORAL | Status: AC
  Administered 2022-11-17: 20 mg via ORAL
  Filled 2022-11-17: qty 1

## 2022-11-17 NOTE — Patient Instructions (Signed)
[facility]  Discharge Instructions: Thank you for choosing [facility] to provide your oncology and hematology care.   If you have a lab appointment with the Cancer Center, please go directly to the Cancer Center and check in at the registration area.   Wear comfortable clothing and clothing appropriate for easy access to any Portacath or PICC line.   We strive to give you quality time with your provider. You may need to reschedule your appointment if you arrive late (15 or more minutes).  Arriving late affects you and other patients whose appointments are after yours.  Also, if you miss three or more appointments without notifying the office, you may be dismissed from the clinic at the provider's discretion.      For prescription refill requests, have your pharmacy contact our office and allow 72 hours for refills to be completed.    Today you received the following chemotherapy and/or immunotherapy agents: Dara Faspro.    To help prevent nausea and vomiting after your treatment, we encourage you to take your nausea medication as directed.  BELOW ARE SYMPTOMS THAT SHOULD BE REPORTED IMMEDIATELY: *FEVER GREATER THAN 100.4 F (38 C) OR HIGHER *CHILLS OR SWEATING *NAUSEA AND VOMITING THAT IS NOT CONTROLLED WITH YOUR NAUSEA MEDICATION *UNUSUAL SHORTNESS OF BREATH *UNUSUAL BRUISING OR BLEEDING *URINARY PROBLEMS (pain or burning when urinating, or frequent urination) *BOWEL PROBLEMS (unusual diarrhea, constipation, pain near the anus) TENDERNESS IN MOUTH AND THROAT WITH OR WITHOUT PRESENCE OF ULCERS (sore throat, sores in mouth, or a toothache) UNUSUAL RASH, SWELLING OR PAIN  UNUSUAL VAGINAL DISCHARGE OR ITCHING   Items with * indicate a potential emergency and should be followed up as soon as possible or go to the Emergency Department if any problems should occur.  Please show the CHEMOTHERAPY ALERT CARD or IMMUNOTHERAPY ALERT CARD at  check-in to the Emergency Department and triage nurse.  Should you have questions after your visit or need to cancel or reschedule your appointment, please contact [facility]  Dept: [phone]  and follow the prompts.  Office hours are 8:00 a.m. to 4:30 p.m. Monday - Friday. Please note that voicemails left after 4:00 p.m. may not be returned until the following business day.  We are closed weekends and major holidays. You have access to a nurse at all times for urgent questions. Please call the main number to the clinic Dept: [phone] and follow the prompts.   For any non-urgent questions, you may also contact your provider using MyChart. We now offer e-Visits for anyone 18 and older to request care online for non-urgent symptoms. For details visit [email or web address].   Also download the MyChart app! Go to the app store, search "MyChart", open the app, select [place], and log in with your MyChart username and password.   

## 2022-11-17 NOTE — Progress Notes (Signed)
[facility] [phone]   [phone]  PROGRESS NOTE  Patient Care Team: Georganna Skeans, MD as PCP - General (Family Medicine) Runell Gess, MD as PCP - Cardiology (Cardiology) Jena Gauss Gerrit Friends, MD as Attending Physician (Gastroenterology)  Hematological/Oncological History # IgG Kappa Multiple Myeloma, 1p32/1q21.  01/04/2022: MRI lumbar spine shows diffuse heterogeneous marrow signal with heterogeneous enhancement throughout the thoracolumbar spine.  Additionally, there is subacute-chronic L3 vertebral body compression fraction. 01/14/2022: establish care with Dr. Leonides Schanz  02/10/2022: bone marrow biopsy shows range of plasma cell involvement from 50% on the biopsy to 60% on aspirate smear slides and close to 90% on the clot section for an overall percentage estimated at approximately 80% overall. 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  10/02/2022: Cycle 1 Day 1 of Dara/Rev/Dex 11/03/2022: Cycle 2 Day 1 of Dara/Rev/Dex   Interval History:  [name] 52 y.o. male for continued management of IgG kappa multiple myeloma. The patient's last visit was on 10/24/2022. In the interim, he continues on dara/rev/dex. He presents today for Cycle 2, Day 15 of treatment.   On exam today [name] reports he has been well overall in the interim since her last visit.  He reports that "things are going well".  He feels good overall but does  occasionally get tired.  He is not having any major side effects as result of his chemotherapy treatment.  He is not having any numbness or tingling of his fingers or toes.  He reports he is eating "too well".  He notes he is not having any nausea, vomiting, or diarrhea.  He denies any runny nose, sore throat, or cough.  He notes he is also trying to use an exercise bike more.  He does report today that he is having some issues with erectile dysfunction.  He notes he would like to try Viagra.  He is never tried Viagra before in the past.  He denies fevers, chills, sweats, shortness of breath, chest pain or cough. He has no other complaints. A full 10 point ROS was otherwise negative.  MEDICAL HISTORY:  Past Medical History:  Diagnosis Date   Allergy    Anemia    Atypical chest pain    Cancer (HCC)    Multiple myeloma with normocytic anemia   GERD (gastroesophageal reflux disease)    Hepatic cyst    Benign by MRI   Hiatal hernia    History of colonic polyps    Hyperlipidemia    Hypertension    Rectal bleeding    Tobacco abuse     SURGICAL HISTORY: Past Surgical History:  Procedure Laterality Date   COLONOSCOPY  01/10/2009     RMR: Normal rectum, normal colon; repeat in 2015 due to FH of colon cancer   COLONOSCOPY N/A 01/09/2014   AVW:[id number] colonic polyps-removed as described   COLONOSCOPY N/A 09/18/2016   Procedure: COLONOSCOPY;  Surgeon: Corbin Ade, MD;  Location: [facility]  ENDO SUITE;  Service: Endoscopy;  Laterality: N/A;  730    ESOPHAGOGASTRODUODENOSCOPY  10/04/2007   RMR: Distal esophageal erosions consistent with erosive reflux esophagitis, patulous gastroesophageal junction status post passage of a  Maloney dilator, 56 [place].  Otherwise, unremarkable esophagus.  Hiatal hernia.  Otherwise normal stomach.  Bulbar erosion   ESOPHAGOGASTRODUODENOSCOPY N/A 01/09/2014   Erosive reflux esophagitis. Small hiatal hernia   HUMERUS IM NAIL Left 02/22/2022   Procedure: INTRAMEDULLARY (IM)  NAIL HUMERAL;  Surgeon: Yolonda Kida, MD;  Location: [facility] OR;  Service: Orthopedics;  Laterality: Left;   I & D EXTREMITY Left 07/26/2018   Procedure: IRRIGATION AND DEBRIDEMENT EXTREMITY;  Surgeon: Knute Neu, MD;  Location: [facility] OR;  Service: Plastics;  Laterality: Left;   IR BONE TUMOR(S)RF ABLATION  02/05/2022   IR BONE TUMOR(S)RF ABLATION  02/24/2022   IR KYPHO EA ADDL LEVEL THORACIC OR LUMBAR  02/19/2022   IR KYPHO LUMBAR INC FX REDUCE BONE BX UNI/BIL CANNULATION INC/IMAGING  02/05/2022   IR KYPHO LUMBAR INC FX REDUCE BONE BX UNI/BIL CANNULATION INC/IMAGING  02/19/2022   PERCUTANEOUS PINNING Left 07/26/2018   Procedure: PERCUTANEOUS PINNING EXTREMITY;  Surgeon: Knute Neu, MD;  Location: [facility] OR;  Service: Plastics;  Laterality: Left;    SOCIAL HISTORY: Social History   Socioeconomic History   Marital status: Divorced    Spouse name: Not on file   Number of children: 2   Years of education: Not on file   Highest education level: Not on file  Occupational History   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[employer]    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[employer]  Tobacco Use   Smoking status: Former    Packs/day: 0.25    Years: 10.00    Additional pack years: 0.00    Total pack years: 2.50    Types: Cigarettes    Quit date: 12/24/2021    Years since quitting: 0.8   Smokeless tobacco: Never  Vaping Use   Vaping Use: Never used  Substance and Sexual Activity   Alcohol use: Yes    Alcohol/week: 0.0 standard drinks of alcohol    Comment: occasional/wine on the weekends   Drug use: No   Sexual activity: Not on file  Other Topics Concern   Not on file  Social History Narrative   Right handed   Lives alone one story home   Social Determinants of Health   Financial Resource Strain: Not on file  Food Insecurity: No Food Insecurity (07/17/2022)   Hunger Vital Sign    Worried About Running Out of Food in the Last Year: Never true    Ran Out of Food in the Last Year:  Never true  Transportation Needs: No Transportation Needs (07/17/2022)   PRAPARE - Transportation    Lack of Transportation (Medical): No    Lack of Transportation (Non-Medical): No  Physical Activity: Not on file  Stress: Not on file  Social Connections: Not on file  Intimate Partner Violence: Not At Risk (07/17/2022)   Humiliation, Afraid, Rape, and Kick questionnaire    Fear of Current or Ex-Partner: No    Emotionally Abused: No    Physically Abused: No    Sexually Abused: No    FAMILY HISTORY: Family History  Problem Relation Age of Onset   Heart disease Mother    Hypertension Mother    Diabetes Father    Hypertension Father    Colon cancer Sister        passed away from  colon cancer, in her 31s   Heart attack Maternal Uncle    Prostate cancer Maternal Uncle    Diabetes Other    Pancreatic cancer Neg Hx    Rectal cancer Neg Hx    Esophageal cancer Neg Hx    Stomach cancer Neg Hx     ALLERGIES:  has No Known Allergies.  MEDICATIONS:  Current Outpatient Medications  Medication Sig Dispense Refill   sildenafil (VIAGRA) 50 MG tablet Take 1 tablet (50 mg total) by mouth daily as needed for erectile dysfunction. 10 tablet 0   acyclovir (ZOVIRAX) 400 MG tablet Take 1 tablet by mouth twice daily 60 tablet 0   allopurinol (ZYLOPRIM) 300 MG tablet Take 1 tablet by mouth once daily 30 tablet 0   aspirin EC 81 MG tablet Take 81 mg by mouth daily. Swallow whole.     cetirizine (ZYRTEC) 10 MG tablet Take 10 mg by mouth daily as needed for allergies.     clonazePAM (KLONOPIN) 0.5 MG tablet Take 1 tablet (0.5 mg total) by mouth 2 (two) times daily as needed for anxiety. 15 tablet 0   docusate sodium (COLACE) 100 MG capsule Take 1 capsule (100 mg total) by mouth 2 (two) times daily as needed for mild constipation. 30 capsule 3   doxycycline (VIBRA-TABS) 100 MG tablet Take 1 tablet (100 mg total) by mouth 2 (two) times daily. 28 tablet 0   DULoxetine (CYMBALTA) 20 MG capsule Take 1  capsule by mouth once daily 90 capsule 1   erythromycin ophthalmic ointment Place 1 Application into the right eye at bedtime for 14 days. 14 g 0   gabapentin (NEURONTIN) 100 MG capsule Take 1 capsule by mouth twice daily 60 capsule 2   hydrochlorothiazide (HYDRODIURIL) 25 MG tablet TAKE 1 TABLET BY MOUTH ONCE DAILY. GIVEN ENOUGH REFILL TO LAST UNTIL APPT. MUST ATTEND APPT FOR FURTHER REFILLS. 38 tablet 0   lisinopril (ZESTRIL) 40 MG tablet Take 40 mg by mouth daily.     Methocarbamol 1000 MG TABS Take 1,000 mg by mouth every 6 (six) hours as needed. (Patient taking differently: Take 1,000 mg by mouth every 6 (six) hours as needed (muscle spasms).) 45 tablet 1   morphine (MS CONTIN) 30 MG 12 hr tablet Take 1 tablet (30 mg total) by mouth every 12 (twelve) hours. 60 tablet 0   ondansetron (ZOFRAN) 8 MG tablet Take 1 tablet (8 mg total) by mouth every 8 (eight) hours as needed for nausea or vomiting. 60 tablet 3   pantoprazole (PROTONIX) 40 MG tablet Take 1 tablet by mouth twice daily 60 tablet 0   polyethylene glycol powder (GLYCOLAX/MIRALAX) 17 GM/SCOOP powder Take 1 capful (17 g) with water by mouth daily. (Patient taking differently: Take 17 g by mouth daily as needed for mild constipation.) 238 g 0   prochlorperazine (COMPAZINE) 10 MG tablet Take 1 tablet (10 mg total) by mouth every 6 (six) hours as needed for nausea or vomiting. 60 tablet 0   REVLIMID 25 MG capsule Take 1 capsule (25 mg total) by mouth daily. Take one capsule daily x 14 days. None for following 7 days Auth #  [id number] Date obtained  11/05/22 14 capsule 0   rosuvastatin (CRESTOR) 10 MG tablet Take 1 tablet (10 mg total) by mouth daily. 90 tablet 1   No current facility-administered medications for this visit.   Facility-Administered Medications Ordered in Other Visits  Medication Dose Route Frequency Provider Last Rate Last Admin   acetaminophen (TYLENOL)  tablet 650 mg  650 mg Oral Once Jaci Standard, MD        daratumumab-hyaluronidase-fihj [facility] FASPRO) 1800-30000 MG-UT/15ML chemo SQ injection 1,800 mg  1,800 mg Subcutaneous Once Jaci Standard, MD       dexamethasone (DECADRON) tablet 40 mg  40 mg Oral Once Jaci Standard, MD       diphenhydrAMINE (BENADRYL) capsule 50 mg  50 mg Oral Once Jaci Standard, MD       famotidine (PEPCID) tablet 20 mg  20 mg Oral Once Jaci Standard, MD        REVIEW OF SYSTEMS:   Constitutional: ( - ) fevers, ( - )  chills , ( - ) night sweats Eyes: ( - ) blurriness of vision, ( - ) double vision, ( - ) watery eyes Ears, nose, mouth, throat, and face: ( - ) mucositis, ( - ) sore throat Respiratory: ( - ) cough, ( - ) dyspnea, ( - ) wheezes Cardiovascular: ( - ) palpitation, ( - ) chest discomfort, ( - ) lower extremity swelling Gastrointestinal:  ( - ) nausea, ( +) heartburn, ( - ) change in bowel habits Skin: ( - ) abnormal skin rashes Lymphatics: ( - ) new lymphadenopathy, ( - ) easy bruising Neurological: ( - ) numbness, ( - ) tingling, ( - ) new weaknesses Behavioral/Psych: ( - ) mood change, ( - ) new changes  All other systems were reviewed with the patient and are negative.  PHYSICAL EXAMINATION: ECOG PERFORMANCE STATUS: 1 - Symptomatic but completely ambulatory  Vitals:   11/17/22 0835  BP: (!) 137/95  Pulse: 82  Resp: 16  Temp: (!) 97.5 F (36.4 C)  SpO2: 100%      Filed Weights   11/17/22 0835  Weight: 241 lb 4.8 oz (109.5 kg)      GENERAL: Well-appearing young African-American male, alert, no distress and comfortable SKIN: skin color, texture, turgor are normal, no rashes or significant lesions EYES: conjunctiva are pink and non-injected, sclera clear. Mild residual swelling in right eyelid with surround erythema. No purulent discharge noted.  LUNGS: clear to auscultation and percussion with normal breathing effort HEART: regular rate & rhythm and no murmurs and no lower extremity edema Musculoskeletal: no cyanosis of  digits and no clubbing  PSYCH: alert & oriented x 3, fluent speech NEURO: no focal motor/sensory deficits  LABORATORY DATA:  I have reviewed the data as listed    Latest Ref Rng & Units 11/17/2022    8:10 AM 11/10/2022    1:38 PM 11/03/2022    8:24 AM  CBC  WBC 4.0 - 10.5 K/uL 6.4  6.3  3.8   Hemoglobin 13.0 - 17.0 g/dL 16.1  09.6  04.5   Hematocrit 39.0 - 52.0 % 38.2  38.6  40.1   Platelets 150 - 400 K/uL 207  209  192        Latest Ref Rng & Units 11/17/2022    8:10 AM 11/10/2022    1:38 PM 11/03/2022    8:24 AM  CMP  Glucose 70 - 99 mg/dL 409  811  914   BUN 6 - 20 mg/dL 15  13  11    Creatinine 0.61 - 1.24 mg/dL 7.82  9.56  2.13   Sodium 135 - 145 mmol/L 137  134  132   Potassium 3.5 - 5.1 mmol/L 3.9  4.2  4.0   Chloride 98 - 111 mmol/L 103  99  99   CO2 22 - 32 mmol/L 27  27  25    Calcium 8.9 - 10.3 mg/dL 9.0  9.4  9.2   Total Protein 6.5 - 8.1 g/dL 6.5  6.5  7.0   Total Bilirubin 0.3 - 1.2 mg/dL 0.5  0.5  0.4   Alkaline Phos 38 - 126 U/L 48  55  57   AST 15 - 41 U/L 14  18  23    ALT 0 - 44 U/L 17  23  28      Lab Results  Component Value Date   MPROTEIN 0.2 (H) 11/03/2022   MPROTEIN 0.3 (H) 09/17/2022   MPROTEIN 0.3 (H) 08/26/2022   Lab Results  Component Value Date   KPAFRELGTCHN 11.2 11/03/2022   KPAFRELGTCHN 19.2 09/17/2022   KPAFRELGTCHN 17.1 08/26/2022   LAMBDASER 8.5 11/03/2022   LAMBDASER 9.8 09/17/2022   LAMBDASER 9.2 08/26/2022   KAPLAMBRATIO 1.32 11/03/2022   KAPLAMBRATIO 1.96 (H) 09/17/2022   KAPLAMBRATIO 1.86 (H) 08/26/2022   RADIOGRAPHIC STUDIES: No results found.  ASSESSMENT & PLAN [name] 52 y.o. male with medical history significant for IgG kappa multiple myeloma who presents for a follow up visit.   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 -- Recommend VRD chemotherapy with intention  of proceeding to transplant. -- Labs at each visit to include CBC, CMP, LDH with monthly restaging labs SPEP and serum free light chains -- Started VRD therapy on 02/26/2022. Held Velcade therapy on 09/24/2022 due to development of ocular toxicity. --Switched to Dara/Rev/Dex on 10/02/2022 PLAN: --Due for Cycle 2, Day 15 of Dara/Rev/Dex.  --Labs today reviewed and adequate for treatment. WBC 6.4, hemoglobin 12.7, MCV 87.6, and platelets of 207 --Most recent myeloma labs from 09/16/2021 detected M protein at 0.2 g/dL. Kappa light chain 11.2, Lambda light chain 8.5, Ratio 1.32 --Proceed with treatment today without any dose modifications.  --RTC for weekly treatments with biweekly toxicity checks.   #Erectile Dysfunction -- Likely secondary to chemotherapy. -- Will prescribe Viagra 50 mg p.o. as needed x 10 pills. -- If ineffective could consider Cialis.  # Leg Discomfort/Restless Leg -- Patient provided with Requip by the emergency department, discontinued due to poor efficacy. --Evaluated by Dr. Barbaraann Cao on 07/28/2022. Recommended to short course of steroids so started prednisone 50 mg daily x 7 days. --Discussed etiologies including iron deficiency so iron levels were checked and was normal.   #Constipation--improving: --Recommend to continue on colace but add miralax as well.   #Pathologic fractures-secondary to MM: --Involving L1, L2 and L3 compression fracture and left humerus fracture.  --Underwent kyphoplasty of L3 fracture on 02/19/2022 --Underwent medullary nailing of left humeral shaft on 02/22/2022 --Underwent kyphoplasty on 06/04/2022.   #Pain Medication -- Per palliative care patient has weaned off MS contin. Currently conitnues on gabapentin 100 mg BID, cymbalta 20 mg and robaxin as needed.  --Under the care of palliative care team.   #Supportive Care -- chemotherapy education complete -- port placement not required -- zofran 8mg  q8H PRN and compazine 10mg  PO q6H for nausea --  acyclovir 400mg  PO BID for VCZ prophylaxis -- allopurinol 300mg  PO daily for TLS prophylaxis -- Dental clearance for Zometa/Denosumab approved.  Next dose is due today.   No orders of the defined types were placed in this encounter.   All questions were answered. The patient knows  to call the clinic with any problems, questions or concerns.  I have spent a total of 30 minutes minutes of face-to-face and non-face-to-face time, preparing to see the patient,performing a medically appropriate examination, counseling and educating the patient, documenting clinical information in the electronic health record,  and care coordination.  Ulysees Barns, MD Department of Hematology/Oncology [facility] Phone: [phone] Pager: [phone] Email: [email or web address]

## 2022-11-21 ENCOUNTER — Ambulatory Visit: Payer: BC Managed Care – PPO

## 2022-11-21 ENCOUNTER — Encounter: Payer: Self-pay | Admitting: Orthopedic Surgery

## 2022-11-21 ENCOUNTER — Other Ambulatory Visit: Payer: BC Managed Care – PPO

## 2022-11-24 ENCOUNTER — Encounter: Payer: Self-pay | Admitting: Physician Assistant

## 2022-11-24 ENCOUNTER — Inpatient Hospital Stay: Payer: MEDICAID

## 2022-11-24 ENCOUNTER — Inpatient Hospital Stay: Payer: MEDICAID | Attending: Physician Assistant

## 2022-11-24 VITALS — BP 145/83 | HR 103 | Temp 98.6°F | Resp 19 | Wt 237.5 lb

## 2022-11-24 DIAGNOSIS — Z87891 Personal history of nicotine dependence: Secondary | ICD-10-CM | POA: Insufficient documentation

## 2022-11-24 DIAGNOSIS — Z7189 Other specified counseling: Secondary | ICD-10-CM

## 2022-11-24 DIAGNOSIS — C9000 Multiple myeloma not having achieved remission: Secondary | ICD-10-CM | POA: Insufficient documentation

## 2022-11-24 DIAGNOSIS — Z5112 Encounter for antineoplastic immunotherapy: Secondary | ICD-10-CM | POA: Insufficient documentation

## 2022-11-24 DIAGNOSIS — G893 Neoplasm related pain (acute) (chronic): Secondary | ICD-10-CM | POA: Insufficient documentation

## 2022-11-24 LAB — CBC WITH DIFFERENTIAL (CANCER CENTER ONLY)
Abs Immature Granulocytes: 0.06 10*3/uL (ref 0.00–0.07)
Basophils Absolute: 0 10*3/uL (ref 0.0–0.1)
Basophils Relative: 0 %
Eosinophils Absolute: 0.1 10*3/uL (ref 0.0–0.5)
Eosinophils Relative: 1 %
HCT: 41.9 % (ref 39.0–52.0)
Hemoglobin: 13.7 g/dL (ref 13.0–17.0)
Immature Granulocytes: 1 %
Lymphocytes Relative: 25 %
Lymphs Abs: 1.8 10*3/uL (ref 0.7–4.0)
MCH: 29.2 pg (ref 26.0–34.0)
MCHC: 32.7 g/dL (ref 30.0–36.0)
MCV: 89.3 fL (ref 80.0–100.0)
Monocytes Absolute: 0.3 10*3/uL (ref 0.1–1.0)
Monocytes Relative: 4 %
Neutro Abs: 4.9 10*3/uL (ref 1.7–7.7)
Neutrophils Relative %: 69 %
Platelet Count: 225 10*3/uL (ref 150–400)
RBC: 4.69 MIL/uL (ref 4.22–5.81)
RDW: 13.5 % (ref 11.5–15.5)
WBC Count: 7.2 10*3/uL (ref 4.0–10.5)
nRBC: 0 % (ref 0.0–0.2)

## 2022-11-24 LAB — CMP (CANCER CENTER ONLY)
ALT: 20 U/L (ref 0–44)
AST: 19 U/L (ref 15–41)
Albumin: 4.1 g/dL (ref 3.5–5.0)
Alkaline Phosphatase: 59 U/L (ref 38–126)
Anion gap: 9 (ref 5–15)
BUN: 13 mg/dL (ref 6–20)
CO2: 26 mmol/L (ref 22–32)
Calcium: 9 mg/dL (ref 8.9–10.3)
Chloride: 96 mmol/L — ABNORMAL LOW (ref 98–111)
Creatinine: 0.95 mg/dL (ref 0.61–1.24)
GFR, Estimated: >60 mL/min (ref >60)
Glucose, Bld: 141 mg/dL — ABNORMAL HIGH (ref 70–99)
Potassium: 3.9 mmol/L (ref 3.5–5.1)
Sodium: 131 mmol/L — ABNORMAL LOW (ref 135–145)
Total Bilirubin: 0.5 mg/dL (ref 0.3–1.2)
Total Protein: 7.3 g/dL (ref 6.5–8.1)

## 2022-11-24 MED ORDER — FAMOTIDINE 20 MG PO TABS
20.0000 mg | ORAL_TABLET | Freq: Once | ORAL | Status: AC
  Administered 2022-11-24: 20 mg via ORAL
  Filled 2022-11-24: qty 1

## 2022-11-24 MED ORDER — DIPHENHYDRAMINE HCL 25 MG PO CAPS
50.0000 mg | ORAL_CAPSULE | Freq: Once | ORAL | Status: AC
  Administered 2022-11-24: 50 mg via ORAL
  Filled 2022-11-24: qty 2

## 2022-11-24 MED ORDER — DEXAMETHASONE 4 MG PO TABS
40.0000 mg | ORAL_TABLET | Freq: Once | ORAL | Status: AC
  Administered 2022-11-24: 40 mg via ORAL
  Filled 2022-11-24: qty 10

## 2022-11-24 MED ORDER — DARATUMUMAB-HYALURONIDASE-FIHJ 1800-30000 MG-UT/15ML ~~LOC~~ SOLN
1800.0000 mg | Freq: Once | SUBCUTANEOUS | Status: AC
  Administered 2022-11-24: 1800 mg via SUBCUTANEOUS
  Filled 2022-11-24: qty 15

## 2022-11-24 MED ORDER — ACETAMINOPHEN 325 MG PO TABS
650.0000 mg | ORAL_TABLET | Freq: Once | ORAL | Status: AC
  Administered 2022-11-24: 650 mg via ORAL
  Filled 2022-11-24: qty 2

## 2022-11-24 NOTE — Patient Instructions (Signed)
[facility]  Discharge Instructions: Thank you for choosing [facility] to provide your oncology and hematology care.   If you have a lab appointment with the Cancer Center, please go directly to the Cancer Center and check in at the registration area.   Wear comfortable clothing and clothing appropriate for easy access to any Portacath or PICC line.   We strive to give you quality time with your provider. You may need to reschedule your appointment if you arrive late (15 or more minutes).  Arriving late affects you and other patients whose appointments are after yours.  Also, if you miss three or more appointments without notifying the office, you may be dismissed from the clinic at the provider's discretion.      For prescription refill requests, have your pharmacy contact our office and allow 72 hours for refills to be completed.    Today you received the following chemotherapy and/or immunotherapy agents Darzalex Faspro      To help prevent nausea and vomiting after your treatment, we encourage you to take your nausea medication as directed.  BELOW ARE SYMPTOMS THAT SHOULD BE REPORTED IMMEDIATELY: *FEVER GREATER THAN 100.4 F (38 C) OR HIGHER *CHILLS OR SWEATING *NAUSEA AND VOMITING THAT IS NOT CONTROLLED WITH YOUR NAUSEA MEDICATION *UNUSUAL SHORTNESS OF BREATH *UNUSUAL BRUISING OR BLEEDING *URINARY PROBLEMS (pain or burning when urinating, or frequent urination) *BOWEL PROBLEMS (unusual diarrhea, constipation, pain near the anus) TENDERNESS IN MOUTH AND THROAT WITH OR WITHOUT PRESENCE OF ULCERS (sore throat, sores in mouth, or a toothache) UNUSUAL RASH, SWELLING OR PAIN  UNUSUAL VAGINAL DISCHARGE OR ITCHING   Items with * indicate a potential emergency and should be followed up as soon as possible or go to the Emergency Department if any problems should occur.  Please show the CHEMOTHERAPY ALERT CARD or IMMUNOTHERAPY ALERT CARD at  check-in to the Emergency Department and triage nurse.  Should you have questions after your visit or need to cancel or reschedule your appointment, please contact [facility]  Dept: [phone]  and follow the prompts.  Office hours are 8:00 a.m. to 4:30 p.m. Monday - Friday. Please note that voicemails left after 4:00 p.m. may not be returned until the following business day.  We are closed weekends and major holidays. You have access to a nurse at all times for urgent questions. Please call the main number to the clinic Dept: [phone] and follow the prompts.   For any non-urgent questions, you may also contact your provider using MyChart. We now offer e-Visits for anyone 18 and older to request care online for non-urgent symptoms. For details visit [email or web address].   Also download the MyChart app! Go to the app store, search "MyChart", open the app, select [facility], and log in with your MyChart username and password.  

## 2022-11-24 NOTE — Progress Notes (Signed)
Per Dr Leonides Schanz, ok to tx with HR 103.

## 2022-11-26 ENCOUNTER — Other Ambulatory Visit: Payer: Self-pay | Admitting: *Deleted

## 2022-11-26 DIAGNOSIS — C9000 Multiple myeloma not having achieved remission: Secondary | ICD-10-CM

## 2022-11-26 MED ORDER — REVLIMID 25 MG PO CAPS
25.0000 mg | ORAL_CAPSULE | Freq: Every day | ORAL | 0 refills | Status: DC
Start: 2022-11-26 — End: 2022-12-10

## 2022-11-28 ENCOUNTER — Inpatient Hospital Stay: Payer: BC Managed Care – PPO | Admitting: Physician Assistant

## 2022-11-28 ENCOUNTER — Other Ambulatory Visit: Payer: Self-pay | Admitting: Family Medicine

## 2022-11-28 ENCOUNTER — Inpatient Hospital Stay: Payer: BC Managed Care – PPO

## 2022-11-28 NOTE — Progress Notes (Unsigned)
Palliative Medicine [facility]  Telephone:(336) [phone] [phone]   Name: [name] Date: 11/28/2022 MRN: [id number]  DOB: 1971/05/21  Patient Care Team: Georganna Skeans, MD as PCP - General (Family Medicine) Runell Gess, MD as PCP - Cardiology (Cardiology) Jena Gauss Gerrit Friends, MD as Attending Physician (Gastroenterology)   INTERVAL HISTORY: [name] is a 52 y.o. male with oncologic medical history including IgG Kappa Multiple Myeloma, L1, L2, and L3 vertebral compression fraction s/p kyphoplasty(9/27) and IM nailing of left humeral shaft (9/30), currently undergoing VRD therapy.  Palliative ask to see for symptom management.   SOCIAL HISTORY:     reports that he quit smoking about 11 months ago. His smoking use included cigarettes. He has a 2.50 pack-year smoking history. He has never used smokeless tobacco. He reports current alcohol use. He reports that he does not use drugs.  ADVANCE DIRECTIVES:    CODE STATUS:   PAST MEDICAL HISTORY: Past Medical History:  Diagnosis Date   Allergy    Anemia    Atypical chest pain    Cancer (HCC)    Multiple myeloma with normocytic anemia   GERD (gastroesophageal reflux disease)    Hepatic cyst    Benign by MRI   Hiatal hernia    History of colonic polyps    Hyperlipidemia    Hypertension    Rectal bleeding    Tobacco abuse     ALLERGIES:  has No Known Allergies.  MEDICATIONS:  Current Outpatient Medications  Medication Sig Dispense Refill   acyclovir (ZOVIRAX) 400 MG tablet Take 1 tablet by mouth twice daily 60 tablet 0   allopurinol (ZYLOPRIM) 300 MG tablet Take 1 tablet by mouth once daily 30 tablet 0   aspirin EC 81 MG tablet Take 81 mg by mouth daily. Swallow whole.     cetirizine (ZYRTEC) 10 MG tablet Take 10 mg by mouth daily as needed for allergies.     clonazePAM (KLONOPIN) 0.5 MG tablet Take 1 tablet (0.5 mg total) by mouth 2 (two) times daily as needed for anxiety. 15  tablet 0   docusate sodium (COLACE) 100 MG capsule Take 1 capsule (100 mg total) by mouth 2 (two) times daily as needed for mild constipation. 30 capsule 3   doxycycline (VIBRA-TABS) 100 MG tablet Take 1 tablet (100 mg total) by mouth 2 (two) times daily. 28 tablet 0   DULoxetine (CYMBALTA) 20 MG capsule Take 1 capsule by mouth once daily 90 capsule 1   gabapentin (NEURONTIN) 100 MG capsule Take 1 capsule by mouth twice daily 60 capsule 2   hydrochlorothiazide (HYDRODIURIL) 25 MG tablet TAKE 1 TABLET BY MOUTH ONCE DAILY(GIVEN ENOUGH REFILL TO LAST UNTIL APPOINTMENT.MUST ATTEND APPT.FOR FURTHER REFILLS.) 38 tablet 0   lisinopril (ZESTRIL) 40 MG tablet Take 40 mg by mouth daily.     Methocarbamol 1000 MG TABS Take 1,000 mg by mouth every 6 (six) hours as needed. (Patient taking differently: Take 1,000 mg by mouth every 6 (six) hours as needed (muscle spasms).) 45 tablet 1   morphine (MS CONTIN) 30 MG 12 hr tablet Take 1 tablet (30 mg total) by mouth every 12 (twelve) hours. 60 tablet 0   ondansetron (ZOFRAN) 8 MG tablet Take 1 tablet (8 mg total) by mouth every 8 (eight) hours as needed for nausea or vomiting. 60 tablet 3   pantoprazole (PROTONIX) 40 MG tablet Take 1 tablet by mouth twice daily 60 tablet 0   polyethylene glycol  powder (GLYCOLAX/MIRALAX) 17 GM/SCOOP powder Take 1 capful (17 g) with water by mouth daily. (Patient taking differently: Take 17 g by mouth daily as needed for mild constipation.) 238 g 0   prochlorperazine (COMPAZINE) 10 MG tablet Take 1 tablet (10 mg total) by mouth every 6 (six) hours as needed for nausea or vomiting. 60 tablet 0   REVLIMID 25 MG capsule Take 1 capsule (25 mg total) by mouth daily. Take one capsule daily x 14 days. None for following 7 days Auth # [id number] Date obtained  11/26/22 14 capsule 0   rosuvastatin (CRESTOR) 10 MG tablet Take 1 tablet (10 mg total) by mouth daily. 90 tablet 1   sildenafil (VIAGRA) 50 MG tablet Take 1 tablet (50 mg total) by mouth daily  as needed for erectile dysfunction. 10 tablet 0   No current facility-administered medications for this visit.    VITAL SIGNS: There were no vitals taken for this visit. There were no vitals filed for this visit.  Estimated body mass index is 35.07 kg/m as calculated from the following:   Height as of 11/03/22: 5\' 9"  (1.753 m).   Weight as of 11/24/22: 237 lb 8 oz (107.7 kg).   PERFORMANCE STATUS (ECOG) : 1 - Symptomatic but completely ambulatory  Assessment NAD, ambulatory RRR Normal breathing pattern AAO x4  IMPRESSION:  I saw [name] during his infusion. No acute distress. Tolerating treatments. Continues to do well. Remaining as active as possible. Appetite good. Denies nausea, vomiting, constipation, or diarrhea.   Neoplasm related pain Erhardt reports pain continues to be well-controlled.    MS Contin 30 mg twice daily.  Continues with Gabapentin 100 mg twice daily, Cymbalta 20 mg, and robaxin as needed. Given controlled pain no changes at this time.  Will continue to closely monitor.  Nausea/Indigestion Resolved. Appetite is doing good.   Constipation Resolved with current regimen  Overall Zacory is much improved.  Appreciative of where he is in his health journey.  He knows to contact our office as needed with any concerns.  PLAN:  MS Contin 30 mg daily Gabapentin 100 mg twice daily Cymbalta 20 mg daily  Zofran and Compazine as needed for nausea Robaxin as needed for muscle spasms Miralax daily for constipation I will plan to see patient back in 4-6 weeks in collaboration with other oncology appointments.  Patient knows to contact office sooner if needed.   Patient expressed understanding and was in agreement with this plan. He also understands that He can call the clinic at any time with any questions, concerns, or complaints.    Any controlled substances utilized were prescribed in the context of palliative care. PDMP has been reviewed.    Visit consisted  of counseling and education dealing with the complex and emotionally intense issues of symptom management and palliative care in the setting of serious and potentially life-threatening illness.Greater than 50%  of this time was spent counseling and coordinating care related to the above assessment and plan.  Willette Alma, AGPCNP-BC  Palliative Medicine Team/[facility]  *Please note that this is a verbal dictation therefore any spelling or grammatical errors are due to the "Dragon Medical One" system interpretation.

## 2022-11-30 ENCOUNTER — Ambulatory Visit: Admission: RE | Admit: 2022-11-30 | Payer: BC Managed Care – PPO | Source: Ambulatory Visit

## 2022-11-30 ENCOUNTER — Other Ambulatory Visit: Payer: Self-pay | Admitting: Hematology and Oncology

## 2022-11-30 NOTE — Progress Notes (Unsigned)
[facility] Telephone:(336) [phone]   Fax:(336) [phone]  PROGRESS NOTE  Patient Care Team: Georganna Skeans, MD as PCP - General (Family Medicine) Runell Gess, MD as PCP - Cardiology (Cardiology) Jena Gauss Gerrit Friends, MD as Attending Physician (Gastroenterology)  Hematological/Oncological History # IgG Kappa Multiple Myeloma, 1p32/1q21.  01/04/2022: MRI lumbar spine shows diffuse heterogeneous marrow signal with heterogeneous enhancement throughout the thoracolumbar spine.  Additionally, there is subacute-chronic L3 vertebral body compression fraction. 01/14/2022: establish care with Dr. Leonides Schanz  02/10/2022: bone marrow biopsy shows range of plasma cell involvement from 50% on the biopsy to 60% on aspirate smear slides and close to 90% on the clot section for an overall percentage estimated at approximately 80% overall. 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  10/02/2022: Cycle 1 Day 1 of Dara/Rev/Dex 11/03/2022: Cycle 2 Day 1 of Dara/Rev/Dex 11/30/2022:  Cycle 3 Day 1 of Dara/Rev/Dex  Interval History:  [name] 52 y.o. male for continued management of IgG kappa multiple myeloma. The patient's last visit was on 11/17/2022. In the interim, he continues on dara/rev/dex. He presents today for Cycle 3, Day 1 of treatment.   On exam today [name] reports he has been doing okay in the interim since her last visit.  He is had no major changes in his health.  He  reports that he does have some occasional cramps in his hands, legs, and feet and that a teaspoon of mustard works "wonders".  He notes that he has episodes of being tired and fatigued but his appetite is strong.  He reports his back pain is currently under good control.  He does have difficulty lying flat for MRI scans.  He is had no recent infectious symptoms such as runny nose or sore throat.[name]  He denies fevers, chills, sweats, shortness of breath, chest pain or cough. He has no other complaints. A full 10 point ROS was otherwise negative.  MEDICAL HISTORY:  Past Medical History:  Diagnosis Date   Allergy    Anemia    Atypical chest pain    Cancer (HCC)    Multiple myeloma with normocytic anemia   GERD (gastroesophageal reflux disease)    Hepatic cyst    Benign by MRI   Hiatal hernia    History of colonic polyps    Hyperlipidemia    Hypertension    Rectal bleeding    Tobacco abuse     SURGICAL HISTORY: Past Surgical History:  Procedure Laterality Date   COLONOSCOPY  01/10/2009     RMR: Normal rectum, normal colon; repeat in 2015 due to FH of colon cancer   COLONOSCOPY N/A 01/09/2014   AVW:[id number] colonic polyps-removed as described   COLONOSCOPY N/A 09/18/2016   Procedure: COLONOSCOPY;  Surgeon: Corbin Ade, MD;  [facility];  Service: Endoscopy;  Laterality: N/A;  730    ESOPHAGOGASTRODUODENOSCOPY  10/04/2007   RMR: Distal esophageal erosions consistent with erosive reflux esophagitis, patulous gastroesophageal  junction status post passage of a  Maloney dilator, 56 [place].  Otherwise, unremarkable esophagus.  Hiatal hernia.  Otherwise normal stomach.  Bulbar erosion   ESOPHAGOGASTRODUODENOSCOPY N/A 01/09/2014   Erosive reflux esophagitis. Small hiatal hernia   HUMERUS IM NAIL Left 02/22/2022   Procedure: INTRAMEDULLARY (IM) NAIL HUMERAL;  Surgeon: Yolonda Kida, MD;  Location: [facility] OR;  Service: Orthopedics;  Laterality: Left;   I & D EXTREMITY Left 07/26/2018    Procedure: IRRIGATION AND DEBRIDEMENT EXTREMITY;  Surgeon: Knute Neu, MD;  Location: [facility] OR;  Service: Plastics;  Laterality: Left;   IR BONE TUMOR(S)RF ABLATION  02/05/2022   IR BONE TUMOR(S)RF ABLATION  02/24/2022   IR KYPHO EA ADDL LEVEL THORACIC OR LUMBAR  02/19/2022   IR KYPHO LUMBAR INC FX REDUCE BONE BX UNI/BIL CANNULATION INC/IMAGING  02/05/2022   IR KYPHO LUMBAR INC FX REDUCE BONE BX UNI/BIL CANNULATION INC/IMAGING  02/19/2022   PERCUTANEOUS PINNING Left 07/26/2018   Procedure: PERCUTANEOUS PINNING EXTREMITY;  Surgeon: Knute Neu, MD;  Location: [facility] OR;  Service: Plastics;  Laterality: Left;    SOCIAL HISTORY: Social History   Socioeconomic History   Marital status: Divorced    Spouse name: Not on file   Number of children: 2   Years of education: Not on file   Highest education level: Not on file  Occupational History   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[employer]    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[employer]  Tobacco Use   Smoking status: Former    Packs/day: 0.25    Years: 10.00    Additional pack years: 0.00    Total pack years: 2.50    Types: Cigarettes    Quit date: 12/24/2021    Years since quitting: 0.9   Smokeless tobacco: Never  Vaping Use   Vaping Use: Never used  Substance and Sexual Activity   Alcohol use: Yes    Alcohol/week: 0.0 standard drinks of alcohol    Comment: occasional/wine on the weekends   Drug use: No   Sexual activity: Not on file  Other Topics Concern   Not on file  Social History Narrative   Right handed   Lives alone one story home   Social Determinants of Health   Financial Resource Strain: Not on file  Food Insecurity: No Food Insecurity (07/17/2022)   Hunger Vital Sign    Worried About Running Out of Food in the Last Year: Never true    Ran Out of Food in the Last Year: Never true  Transportation Needs: No Transportation Needs (07/17/2022)   PRAPARE - Transportation    Lack of Transportation (Medical): No    Lack  of Transportation (Non-Medical): No  Physical Activity: Not on file  Stress: Not on file  Social Connections: Not on file  Intimate Partner Violence: Not At Risk (07/17/2022)   Humiliation, Afraid, Rape, and Kick questionnaire    Fear of Current or Ex-Partner: No    Emotionally Abused: No    Physically Abused: No    Sexually Abused: No    FAMILY HISTORY: Family History  Problem Relation Age of Onset   Heart disease Mother    Hypertension Mother    Diabetes Father    Hypertension Father    Colon cancer Sister        passed away from colon cancer, in her 33s   Heart attack Maternal Uncle    Prostate cancer Maternal Uncle    Diabetes Other    Pancreatic cancer Neg  Hx    Rectal cancer Neg Hx    Esophageal cancer Neg Hx    Stomach cancer Neg Hx     ALLERGIES:  has No Known Allergies.  MEDICATIONS:  Current Outpatient Medications  Medication Sig Dispense Refill   acyclovir (ZOVIRAX) 400 MG tablet Take 1 tablet by mouth twice daily 60 tablet 0   allopurinol (ZYLOPRIM) 300 MG tablet Take 1 tablet by mouth once daily 30 tablet 0   aspirin EC 81 MG tablet Take 81 mg by mouth daily. Swallow whole.     cetirizine (ZYRTEC) 10 MG tablet Take 10 mg by mouth daily as needed for allergies.     clonazePAM (KLONOPIN) 0.5 MG tablet Take 1 tablet (0.5 mg total) by mouth 2 (two) times daily as needed for anxiety. 15 tablet 0   docusate sodium (COLACE) 100 MG capsule Take 1 capsule (100 mg total) by mouth 2 (two) times daily as needed for mild constipation. 30 capsule 3   DULoxetine (CYMBALTA) 20 MG capsule Take 1 capsule by mouth once daily 90 capsule 1   gabapentin (NEURONTIN) 100 MG capsule Take 1 capsule by mouth twice daily 60 capsule 2   hydrochlorothiazide (HYDRODIURIL) 25 MG tablet TAKE 1 TABLET BY MOUTH ONCE DAILY(GIVEN ENOUGH REFILL TO LAST UNTIL APPOINTMENT.MUST ATTEND APPT.FOR FURTHER REFILLS.) 38 tablet 0   lisinopril (ZESTRIL) 40 MG tablet Take 40 mg by mouth daily.      Methocarbamol 1000 MG TABS Take 1,000 mg by mouth every 6 (six) hours as needed. (Patient taking differently: Take 1,000 mg by mouth every 6 (six) hours as needed (muscle spasms).) 45 tablet 1   morphine (MS CONTIN) 30 MG 12 hr tablet Take 1 tablet (30 mg total) by mouth every 12 (twelve) hours. 60 tablet 0   ondansetron (ZOFRAN) 8 MG tablet Take 1 tablet (8 mg total) by mouth every 8 (eight) hours as needed for nausea or vomiting. 60 tablet 3   pantoprazole (PROTONIX) 40 MG tablet Take 1 tablet by mouth twice daily 60 tablet 0   polyethylene glycol powder (GLYCOLAX/MIRALAX) 17 GM/SCOOP powder Take 1 capful (17 g) with water by mouth daily. (Patient taking differently: Take 17 g by mouth daily as needed for mild constipation.) 238 g 0   prochlorperazine (COMPAZINE) 10 MG tablet Take 1 tablet (10 mg total) by mouth every 6 (six) hours as needed for nausea or vomiting. 60 tablet 0   REVLIMID 25 MG capsule Take 1 capsule (25 mg total) by mouth daily. Take one capsule daily x 14 days. None for following 7 days Auth # [id number] Date obtained  11/26/22 14 capsule 0   rosuvastatin (CRESTOR) 10 MG tablet Take 1 tablet (10 mg total) by mouth daily. 90 tablet 1   sildenafil (VIAGRA) 50 MG tablet Take 1 tablet (50 mg total) by mouth daily as needed for erectile dysfunction. 10 tablet 0   No current facility-administered medications for this visit.    REVIEW OF SYSTEMS:   Constitutional: ( - ) fevers, ( - )  chills , ( - ) night sweats Eyes: ( - ) blurriness of vision, ( - ) double vision, ( - ) watery eyes Ears, nose, mouth, throat, and face: ( - ) mucositis, ( - ) sore throat Respiratory: ( - ) cough, ( - ) dyspnea, ( - ) wheezes Cardiovascular: ( - ) palpitation, ( - ) chest discomfort, ( - ) lower extremity swelling Gastrointestinal:  ( - ) nausea, ( +) heartburn, ( - )  change in bowel habits Skin: ( - ) abnormal skin rashes Lymphatics: ( - ) new lymphadenopathy, ( - ) easy bruising Neurological: ( - )  numbness, ( - ) tingling, ( - ) new weaknesses Behavioral/Psych: ( - ) mood change, ( - ) new changes  All other systems were reviewed with the patient and are negative.  PHYSICAL EXAMINATION: ECOG PERFORMANCE STATUS: 1 - Symptomatic but completely ambulatory  Vitals:   12/01/22 1000  BP: (!) 145/84  Pulse: 99  Resp: 18  Temp: 98.5 F (36.9 C)  SpO2: 100%       Filed Weights   12/01/22 1000  Weight: 240 lb 6.4 oz (109 kg)       GENERAL: Well-appearing young African-American male, alert, no distress and comfortable SKIN: skin color, texture, turgor are normal, no rashes or significant lesions EYES: conjunctiva are pink and non-injected, sclera clear. Mild residual swelling in right eyelid with surround erythema. No purulent discharge noted.  LUNGS: clear to auscultation and percussion with normal breathing effort HEART: regular rate & rhythm and no murmurs and no lower extremity edema Musculoskeletal: no cyanosis of digits and no clubbing  PSYCH: alert & oriented x 3, fluent speech NEURO: no focal motor/sensory deficits  LABORATORY DATA:  I have reviewed the data as listed    Latest Ref Rng & Units 12/01/2022    9:42 AM 11/24/2022    1:25 PM 11/17/2022    8:10 AM  CBC  WBC 4.0 - 10.5 K/uL 5.6  7.2  6.4   Hemoglobin 13.0 - 17.0 g/dL 84.1  32.4  40.1   Hematocrit 39.0 - 52.0 % 41.0  41.9  38.2   Platelets 150 - 400 K/uL 239  225  207        Latest Ref Rng & Units 12/01/2022    9:42 AM 11/24/2022    1:25 PM 11/17/2022    8:10 AM  CMP  Glucose 70 - 99 mg/dL 027  253  664   BUN 6 - 20 mg/dL 12  13  15    Creatinine 0.61 - 1.24 mg/dL 4.03  4.74  2.59   Sodium 135 - 145 mmol/L 132  131  137   Potassium 3.5 - 5.1 mmol/L 4.0  3.9  3.9   Chloride 98 - 111 mmol/L 99  96  103   CO2 22 - 32 mmol/L 24  26  27    Calcium 8.9 - 10.3 mg/dL 9.3  9.0  9.0   Total Protein 6.5 - 8.1 g/dL 7.2  7.3  6.5   Total Bilirubin 0.3 - 1.2 mg/dL 0.6  0.5  0.5   Alkaline Phos 38 - 126 U/L 63  59   48   AST 15 - 41 U/L 22  19  14    ALT 0 - 44 U/L 24  20  17      Lab Results  Component Value Date   MPROTEIN 0.2 (H) 11/03/2022   MPROTEIN 0.3 (H) 09/17/2022   MPROTEIN 0.3 (H) 08/26/2022   Lab Results  Component Value Date   KPAFRELGTCHN 11.2 11/03/2022   KPAFRELGTCHN 19.2 09/17/2022   KPAFRELGTCHN 17.1 08/26/2022   LAMBDASER 8.5 11/03/2022   LAMBDASER 9.8 09/17/2022   LAMBDASER 9.2 08/26/2022   KAPLAMBRATIO 1.32 11/03/2022   KAPLAMBRATIO 1.96 (H) 09/17/2022   KAPLAMBRATIO 1.86 (H) 08/26/2022   RADIOGRAPHIC STUDIES: No results found.  ASSESSMENT & PLAN MARKEIS CAYLOR 52 y.o. male with medical history significant for IgG kappa multiple myeloma who presents  for a follow up visit.   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 -- Recommend VRD chemotherapy with intention of proceeding to transplant. -- Labs at each visit to include CBC, CMP, LDH with monthly restaging labs SPEP and serum free light chains -- Started VRD therapy on 02/26/2022. Held Velcade therapy on 09/24/2022 due to development of ocular toxicity. --Switched to Dara/Rev/Dex on 10/02/2022 PLAN: --Due for Cycle 3, Day 1 of Dara/Rev/Dex.  --Labs today reviewed and adequate for treatment. WBC 5.6, hemoglobin 13.4, MCV 88.2, and platelets of 239 --Most recent myeloma labs from 11/02/2021 detected M protein at 0.2 g/dL. Kappa light chain 11.2, Lambda light chain 8.5, Ratio 1.32 --Proceed with treatment today without any dose modifications.  --Patient's M protein is nearing the point where we can make a referral to bone marrow transplant center.  Will make referral to [facility] per patient request today. --RTC for weekly treatments with biweekly toxicity checks.   #Erectile Dysfunction -- Likely secondary to chemotherapy. --prescribed Viagra 50 mg  p.o. as needed x 10 pills. -- If ineffective could consider Cialis.  # Leg Discomfort/Restless Leg -- Patient provided with Requip by the emergency department, discontinued due to poor efficacy. --Evaluated by Dr. Barbaraann Cao on 07/28/2022. Recommended to short course of steroids so started prednisone 50 mg daily x 7 days. --Discussed etiologies including iron deficiency so iron levels were checked and was normal.   #Constipation--improving: --Recommend to continue on colace but add miralax as well.   #Pathologic fractures-secondary to MM: --Involving L1, L2 and L3 compression fracture and left humerus fracture.  --Underwent kyphoplasty of L3 fracture on 02/19/2022 --Underwent medullary nailing of left humeral shaft on 02/22/2022 --Underwent kyphoplasty on 06/04/2022.   #Pain Medication -- Per palliative care patient has weaned off MS contin. Currently conitnues on gabapentin 100 mg BID, cymbalta 20 mg and robaxin as needed.  --Under the care of palliative care team.   #Supportive Care -- chemotherapy education complete -- port placement not required -- zofran 8mg  q8H PRN and compazine 10mg  PO q6H for nausea -- acyclovir 400mg  PO BID for VCZ prophylaxis -- allopurinol 300mg  PO daily for TLS prophylaxis -- Dental clearance for Zometa/Denosumab approved.  Next dose is due today.   No orders of the defined types were placed in this encounter.   All questions were answered. The patient knows to call the clinic with any problems, questions or concerns.  I have spent a total of 30 minutes minutes of face-to-face and non-face-to-face time, preparing to see the patient,performing a medically appropriate examination, counseling and educating the patient, documenting clinical information in the electronic health record,  and care coordination.  Ulysees Barns, MD Department of Hematology/Oncology [facility] at [facility] Phone: [phone] Pager: [phone] Email:  [email or web address]

## 2022-12-01 ENCOUNTER — Inpatient Hospital Stay: Payer: MEDICAID

## 2022-12-01 ENCOUNTER — Inpatient Hospital Stay (HOSPITAL_BASED_OUTPATIENT_CLINIC_OR_DEPARTMENT_OTHER): Payer: MEDICAID | Admitting: Hematology and Oncology

## 2022-12-01 ENCOUNTER — Other Ambulatory Visit: Payer: Self-pay

## 2022-12-01 ENCOUNTER — Telehealth: Payer: Self-pay | Admitting: *Deleted

## 2022-12-01 ENCOUNTER — Encounter: Payer: Self-pay | Admitting: Nurse Practitioner

## 2022-12-01 ENCOUNTER — Inpatient Hospital Stay (HOSPITAL_BASED_OUTPATIENT_CLINIC_OR_DEPARTMENT_OTHER): Payer: MEDICAID | Admitting: Nurse Practitioner

## 2022-12-01 VITALS — BP 145/84 | HR 99 | Temp 98.5°F | Resp 18 | Wt 240.4 lb

## 2022-12-01 VITALS — BP 148/89 | HR 87 | Resp 18

## 2022-12-01 DIAGNOSIS — Z7189 Other specified counseling: Secondary | ICD-10-CM

## 2022-12-01 DIAGNOSIS — C9000 Multiple myeloma not having achieved remission: Secondary | ICD-10-CM

## 2022-12-01 DIAGNOSIS — G893 Neoplasm related pain (acute) (chronic): Secondary | ICD-10-CM

## 2022-12-01 DIAGNOSIS — G2581 Restless legs syndrome: Secondary | ICD-10-CM

## 2022-12-01 DIAGNOSIS — M792 Neuralgia and neuritis, unspecified: Secondary | ICD-10-CM

## 2022-12-01 DIAGNOSIS — Z515 Encounter for palliative care: Secondary | ICD-10-CM

## 2022-12-01 LAB — CBC WITH DIFFERENTIAL (CANCER CENTER ONLY)
Abs Immature Granulocytes: 0.04 10*3/uL (ref 0.00–0.07)
Basophils Absolute: 0 10*3/uL (ref 0.0–0.1)
Basophils Relative: 0 %
Eosinophils Absolute: 0 10*3/uL (ref 0.0–0.5)
Eosinophils Relative: 1 %
HCT: 41 % (ref 39.0–52.0)
Hemoglobin: 13.4 g/dL (ref 13.0–17.0)
Immature Granulocytes: 1 %
Lymphocytes Relative: 29 %
Lymphs Abs: 1.6 10*3/uL (ref 0.7–4.0)
MCH: 28.8 pg (ref 26.0–34.0)
MCHC: 32.7 g/dL (ref 30.0–36.0)
MCV: 88.2 fL (ref 80.0–100.0)
Monocytes Absolute: 0.5 10*3/uL (ref 0.1–1.0)
Monocytes Relative: 9 %
Neutro Abs: 3.4 10*3/uL (ref 1.7–7.7)
Neutrophils Relative %: 60 %
Platelet Count: 239 10*3/uL (ref 150–400)
RBC: 4.65 MIL/uL (ref 4.22–5.81)
RDW: 13.3 % (ref 11.5–15.5)
WBC Count: 5.6 10*3/uL (ref 4.0–10.5)
nRBC: 0 % (ref 0.0–0.2)

## 2022-12-01 LAB — CMP (CANCER CENTER ONLY)
ALT: 24 U/L (ref 0–44)
AST: 22 U/L (ref 15–41)
Albumin: 4.1 g/dL (ref 3.5–5.0)
Alkaline Phosphatase: 63 U/L (ref 38–126)
Anion gap: 9 (ref 5–15)
BUN: 12 mg/dL (ref 6–20)
CO2: 24 mmol/L (ref 22–32)
Calcium: 9.3 mg/dL (ref 8.9–10.3)
Chloride: 99 mmol/L (ref 98–111)
Creatinine: 0.85 mg/dL (ref 0.61–1.24)
GFR, Estimated: >60 mL/min (ref >60)
Glucose, Bld: 157 mg/dL — ABNORMAL HIGH (ref 70–99)
Potassium: 4 mmol/L (ref 3.5–5.1)
Sodium: 132 mmol/L — ABNORMAL LOW (ref 135–145)
Total Bilirubin: 0.6 mg/dL (ref 0.3–1.2)
Total Protein: 7.2 g/dL (ref 6.5–8.1)

## 2022-12-01 MED ORDER — ACETAMINOPHEN 325 MG PO TABS
650.0000 mg | ORAL_TABLET | Freq: Once | ORAL | Status: AC
  Administered 2022-12-01: 650 mg via ORAL
  Filled 2022-12-01: qty 2

## 2022-12-01 MED ORDER — FAMOTIDINE 20 MG PO TABS
20.0000 mg | ORAL_TABLET | Freq: Once | ORAL | Status: AC
  Administered 2022-12-01: 20 mg via ORAL
  Filled 2022-12-01: qty 1

## 2022-12-01 MED ORDER — DIPHENHYDRAMINE HCL 25 MG PO CAPS
50.0000 mg | ORAL_CAPSULE | Freq: Once | ORAL | Status: AC
  Administered 2022-12-01: 50 mg via ORAL
  Filled 2022-12-01: qty 2

## 2022-12-01 MED ORDER — DARATUMUMAB-HYALURONIDASE-FIHJ 1800-30000 MG-UT/15ML ~~LOC~~ SOLN
1800.0000 mg | Freq: Once | SUBCUTANEOUS | Status: AC
  Administered 2022-12-01: 1800 mg via SUBCUTANEOUS
  Filled 2022-12-01: qty 15

## 2022-12-01 MED ORDER — DEXAMETHASONE 4 MG PO TABS
40.0000 mg | ORAL_TABLET | Freq: Once | ORAL | Status: AC
  Administered 2022-12-01: 40 mg via ORAL
  Filled 2022-12-01: qty 10

## 2022-12-01 NOTE — Telephone Encounter (Signed)
Referral fax'd to  [facility] clinic for potential bone marrow transplant for Mulitple myeloma

## 2022-12-01 NOTE — Patient Instructions (Signed)
[facility]  Discharge Instructions: Thank you for choosing [facility] to provide your oncology and hematology care.   If you have a lab appointment with the Cancer Center, please go directly to the Cancer Center and check in at the registration area.   Wear comfortable clothing and clothing appropriate for easy access to any Portacath or PICC line.   We strive to give you quality time with your provider. You may need to reschedule your appointment if you arrive late (15 or more minutes).  Arriving late affects you and other patients whose appointments are after yours.  Also, if you miss three or more appointments without notifying the office, you may be dismissed from the clinic at the provider's discretion.      For prescription refill requests, have your pharmacy contact our office and allow 72 hours for refills to be completed.    Today you received the following chemotherapy and/or immunotherapy agents Darzalex Faspro      To help prevent nausea and vomiting after your treatment, we encourage you to take your nausea medication as directed.  BELOW ARE SYMPTOMS THAT SHOULD BE REPORTED IMMEDIATELY: *FEVER GREATER THAN 100.4 F (38 C) OR HIGHER *CHILLS OR SWEATING *NAUSEA AND VOMITING THAT IS NOT CONTROLLED WITH YOUR NAUSEA MEDICATION *UNUSUAL SHORTNESS OF BREATH *UNUSUAL BRUISING OR BLEEDING *URINARY PROBLEMS (pain or burning when urinating, or frequent urination) *BOWEL PROBLEMS (unusual diarrhea, constipation, pain near the anus) TENDERNESS IN MOUTH AND THROAT WITH OR WITHOUT PRESENCE OF ULCERS (sore throat, sores in mouth, or a toothache) UNUSUAL RASH, SWELLING OR PAIN  UNUSUAL VAGINAL DISCHARGE OR ITCHING   Items with * indicate a potential emergency and should be followed up as soon as possible or go to the Emergency Department if any problems should occur.  Please show the CHEMOTHERAPY ALERT CARD or IMMUNOTHERAPY ALERT CARD at  check-in to the Emergency Department and triage nurse.  Should you have questions after your visit or need to cancel or reschedule your appointment, please contact [facility]  Dept: [phone]  and follow the prompts.  Office hours are 8:00 a.m. to 4:30 p.m. Monday - Friday. Please note that voicemails left after 4:00 p.m. may not be returned until the following business day.  We are closed weekends and major holidays. You have access to a nurse at all times for urgent questions. Please call the main number to the clinic Dept: [phone] and follow the prompts.   For any non-urgent questions, you may also contact your provider using MyChart. We now offer e-Visits for anyone 18 and older to request care online for non-urgent symptoms. For details visit [email or web address].   Also download the MyChart app! Go to the app store, search "MyChart", open the app, select [place], and log in with your MyChart username and password.  

## 2022-12-02 LAB — KAPPA/LAMBDA LIGHT CHAINS
Kappa free light chain: 9.9 mg/L (ref 3.3–19.4)
Kappa, lambda light chain ratio: 1.13 (ref 0.26–1.65)
Lambda free light chains: 8.8 mg/L (ref 5.7–26.3)

## 2022-12-04 LAB — MULTIPLE MYELOMA PANEL, SERUM
Albumin SerPl Elph-Mcnc: 3.5 g/dL (ref 2.9–4.4)
Albumin/Glob SerPl: 1.3 (ref 0.7–1.7)
Alpha 1: 0.2 g/dL (ref 0.0–0.4)
Alpha2 Glob SerPl Elph-Mcnc: 1 g/dL (ref 0.4–1.0)
B-Globulin SerPl Elph-Mcnc: 1 g/dL (ref 0.7–1.3)
Gamma Glob SerPl Elph-Mcnc: 0.6 g/dL (ref 0.4–1.8)
Globulin, Total: 2.9 g/dL (ref 2.2–3.9)
IgA: 33 mg/dL — ABNORMAL LOW (ref 90–386)
IgG (Immunoglobin G), Serum: 634 mg/dL (ref 603–1613)
IgM (Immunoglobulin M), Srm: 21 mg/dL (ref 20–172)
M Protein SerPl Elph-Mcnc: 0.2 g/dL — ABNORMAL HIGH
Total Protein ELP: 6.4 g/dL (ref 6.0–8.5)

## 2022-12-08 ENCOUNTER — Encounter: Payer: Self-pay | Admitting: Hematology and Oncology

## 2022-12-08 ENCOUNTER — Encounter: Payer: Self-pay | Admitting: Hematology

## 2022-12-09 ENCOUNTER — Other Ambulatory Visit: Payer: Self-pay | Admitting: Family Medicine

## 2022-12-09 ENCOUNTER — Encounter: Payer: Self-pay | Admitting: Hematology

## 2022-12-10 ENCOUNTER — Other Ambulatory Visit: Payer: Self-pay | Admitting: Nurse Practitioner

## 2022-12-10 ENCOUNTER — Telehealth: Payer: Self-pay | Admitting: Hematology and Oncology

## 2022-12-10 ENCOUNTER — Other Ambulatory Visit: Payer: Self-pay | Admitting: Family Medicine

## 2022-12-10 ENCOUNTER — Encounter: Payer: Self-pay | Admitting: Hematology

## 2022-12-10 DIAGNOSIS — C9000 Multiple myeloma not having achieved remission: Secondary | ICD-10-CM

## 2022-12-10 MED ORDER — REVLIMID 25 MG PO CAPS
25.0000 mg | ORAL_CAPSULE | Freq: Every day | ORAL | 0 refills | Status: DC
Start: 2022-12-10 — End: 2023-01-08

## 2022-12-12 ENCOUNTER — Other Ambulatory Visit: Payer: BC Managed Care – PPO

## 2022-12-12 ENCOUNTER — Other Ambulatory Visit: Payer: Self-pay | Admitting: Nurse Practitioner

## 2022-12-12 ENCOUNTER — Ambulatory Visit: Payer: BC Managed Care – PPO

## 2022-12-12 DIAGNOSIS — G893 Neoplasm related pain (acute) (chronic): Secondary | ICD-10-CM

## 2022-12-12 DIAGNOSIS — Z515 Encounter for palliative care: Secondary | ICD-10-CM

## 2022-12-12 DIAGNOSIS — C9000 Multiple myeloma not having achieved remission: Secondary | ICD-10-CM

## 2022-12-15 ENCOUNTER — Inpatient Hospital Stay: Payer: MEDICAID

## 2022-12-16 ENCOUNTER — Inpatient Hospital Stay: Payer: MEDICAID

## 2022-12-16 VITALS — BP 128/78 | HR 94 | Temp 98.4°F | Resp 18

## 2022-12-16 DIAGNOSIS — Z7189 Other specified counseling: Secondary | ICD-10-CM

## 2022-12-16 DIAGNOSIS — C9000 Multiple myeloma not having achieved remission: Secondary | ICD-10-CM

## 2022-12-16 LAB — CBC WITH DIFFERENTIAL (CANCER CENTER ONLY)
Abs Immature Granulocytes: 0.05 10*3/uL (ref 0.00–0.07)
Basophils Absolute: 0 10*3/uL (ref 0.0–0.1)
Basophils Relative: 1 %
Eosinophils Absolute: 0.1 10*3/uL (ref 0.0–0.5)
Eosinophils Relative: 2 %
HCT: 38.5 % — ABNORMAL LOW (ref 39.0–52.0)
Hemoglobin: 12.8 g/dL — ABNORMAL LOW (ref 13.0–17.0)
Immature Granulocytes: 1 %
Lymphocytes Relative: 34 %
Lymphs Abs: 1.4 10*3/uL (ref 0.7–4.0)
MCH: 29 pg (ref 26.0–34.0)
MCHC: 33.2 g/dL (ref 30.0–36.0)
MCV: 87.1 fL (ref 80.0–100.0)
Monocytes Absolute: 0.3 10*3/uL (ref 0.1–1.0)
Monocytes Relative: 7 %
Neutro Abs: 2.3 10*3/uL (ref 1.7–7.7)
Neutrophils Relative %: 55 %
Platelet Count: 223 10*3/uL (ref 150–400)
RBC: 4.42 MIL/uL (ref 4.22–5.81)
RDW: 13.6 % (ref 11.5–15.5)
WBC Count: 4.1 10*3/uL (ref 4.0–10.5)
nRBC: 0 % (ref 0.0–0.2)

## 2022-12-16 LAB — CMP (CANCER CENTER ONLY)
ALT: 23 U/L (ref 0–44)
AST: 19 U/L (ref 15–41)
Albumin: 4.1 g/dL (ref 3.5–5.0)
Alkaline Phosphatase: 69 U/L (ref 38–126)
Anion gap: 6 (ref 5–15)
BUN: 7 mg/dL (ref 6–20)
CO2: 29 mmol/L (ref 22–32)
Calcium: 9.5 mg/dL (ref 8.9–10.3)
Chloride: 101 mmol/L (ref 98–111)
Creatinine: 0.91 mg/dL (ref 0.61–1.24)
GFR, Estimated: >60 mL/min (ref >60)
Glucose, Bld: 127 mg/dL — ABNORMAL HIGH (ref 70–99)
Potassium: 3.9 mmol/L (ref 3.5–5.1)
Sodium: 136 mmol/L (ref 135–145)
Total Bilirubin: 0.7 mg/dL (ref 0.3–1.2)
Total Protein: 6.6 g/dL (ref 6.5–8.1)

## 2022-12-16 MED ORDER — DIPHENHYDRAMINE HCL 25 MG PO CAPS
50.0000 mg | ORAL_CAPSULE | Freq: Once | ORAL | Status: AC
  Administered 2022-12-16: 50 mg via ORAL
  Filled 2022-12-16: qty 2

## 2022-12-16 MED ORDER — DARATUMUMAB-HYALURONIDASE-FIHJ 1800-30000 MG-UT/15ML ~~LOC~~ SOLN
1800.0000 mg | Freq: Once | SUBCUTANEOUS | Status: AC
  Administered 2022-12-16: 1800 mg via SUBCUTANEOUS
  Filled 2022-12-16: qty 15

## 2022-12-16 MED ORDER — DEXAMETHASONE 4 MG PO TABS
40.0000 mg | ORAL_TABLET | Freq: Once | ORAL | Status: AC
  Administered 2022-12-16: 40 mg via ORAL
  Filled 2022-12-16: qty 10

## 2022-12-16 MED ORDER — DENOSUMAB 120 MG/1.7ML ~~LOC~~ SOLN
120.0000 mg | Freq: Once | SUBCUTANEOUS | Status: AC
  Administered 2022-12-16: 120 mg via SUBCUTANEOUS
  Filled 2022-12-16: qty 1.7

## 2022-12-16 MED ORDER — FAMOTIDINE 20 MG PO TABS
20.0000 mg | ORAL_TABLET | Freq: Once | ORAL | Status: AC
  Administered 2022-12-16: 20 mg via ORAL
  Filled 2022-12-16: qty 1

## 2022-12-16 MED ORDER — ACETAMINOPHEN 325 MG PO TABS
650.0000 mg | ORAL_TABLET | Freq: Once | ORAL | Status: AC
  Administered 2022-12-16: 650 mg via ORAL
  Filled 2022-12-16: qty 2

## 2022-12-16 NOTE — Patient Instructions (Signed)
[facility]  Discharge Instructions: Thank you for choosing [facility] to provide your oncology and hematology care.   If you have a lab appointment with the Cancer Center, please go directly to the Cancer Center and check in at the registration area.   Wear comfortable clothing and clothing appropriate for easy access to any Portacath or PICC line.   We strive to give you quality time with your provider. You may need to reschedule your appointment if you arrive late (15 or more minutes).  Arriving late affects you and other patients whose appointments are after yours.  Also, if you miss three or more appointments without notifying the office, you may be dismissed from the clinic at the provider's discretion.      For prescription refill requests, have your pharmacy contact our office and allow 72 hours for refills to be completed.    Today you received the following chemotherapy and/or immunotherapy agents darzalex faspro, xgeva      To help prevent nausea and vomiting after your treatment, we encourage you to take your nausea medication as directed.  BELOW ARE SYMPTOMS THAT SHOULD BE REPORTED IMMEDIATELY: *FEVER GREATER THAN 100.4 F (38 C) OR HIGHER *CHILLS OR SWEATING *NAUSEA AND VOMITING THAT IS NOT CONTROLLED WITH YOUR NAUSEA MEDICATION *UNUSUAL SHORTNESS OF BREATH *UNUSUAL BRUISING OR BLEEDING *URINARY PROBLEMS (pain or burning when urinating, or frequent urination) *BOWEL PROBLEMS (unusual diarrhea, constipation, pain near the anus) TENDERNESS IN MOUTH AND THROAT WITH OR WITHOUT PRESENCE OF ULCERS (sore throat, sores in mouth, or a toothache) UNUSUAL RASH, SWELLING OR PAIN  UNUSUAL VAGINAL DISCHARGE OR ITCHING   Items with * indicate a potential emergency and should be followed up as soon as possible or go to the Emergency Department if any problems should occur.  Please show the CHEMOTHERAPY ALERT CARD or IMMUNOTHERAPY ALERT  CARD at check-in to the Emergency Department and triage nurse.  Should you have questions after your visit or need to cancel or reschedule your appointment, please contact [facility]  Dept: [phone]  and follow the prompts.  Office hours are 8:00 a.m. to 4:30 p.m. Monday - Friday. Please note that voicemails left after 4:00 p.m. may not be returned until the following business day.  We are closed weekends and major holidays. You have access to a nurse at all times for urgent questions. Please call the main number to the clinic Dept: [phone] and follow the prompts.   For any non-urgent questions, you may also contact your provider using MyChart. We now offer e-Visits for anyone 46 and older to request care online for non-urgent symptoms. For details visit [email or web address].   Also download the MyChart app! Go to the app store, search "MyChart", open the app, select [place], and log in with your MyChart username and password.

## 2022-12-17 ENCOUNTER — Encounter: Payer: BC Managed Care – PPO | Admitting: Gastroenterology

## 2022-12-24 ENCOUNTER — Encounter: Payer: Self-pay | Admitting: Hematology and Oncology

## 2022-12-24 ENCOUNTER — Encounter: Payer: Self-pay | Admitting: Hematology

## 2022-12-26 ENCOUNTER — Encounter: Payer: Self-pay | Admitting: Hematology and Oncology

## 2022-12-26 ENCOUNTER — Telehealth: Payer: Self-pay | Admitting: Pharmacy Technician

## 2022-12-26 ENCOUNTER — Other Ambulatory Visit (HOSPITAL_COMMUNITY): Payer: Self-pay

## 2022-12-26 ENCOUNTER — Encounter: Payer: Self-pay | Admitting: Hematology

## 2022-12-26 NOTE — Telephone Encounter (Signed)
Oral Oncology Patient Advocate Encounter   Received notification that prior authorization for Revlimid is required.   PA submitted on 12/26/22 Key [id number] Status is pending     Jinger Neighbors, CPhT-Adv Oncology Pharmacy Patient Advocate [facility] Direct Number: [phone]  Fax: [phone]

## 2022-12-26 NOTE — Telephone Encounter (Signed)
Oral Oncology Patient Advocate Encounter  Prior Authorization for Revlimid has been approved.    PA# [id number] Effective dates: 12/26/22 through 12/26/23  Patients co-pay is $150.   Patient is filling at [facility].   Patient has a copay card on file at [facility].  Jinger Neighbors, CPhT-Adv Oncology Pharmacy Patient Advocate [facility] Direct Number: [phone]  Fax: [phone]

## 2022-12-29 ENCOUNTER — Other Ambulatory Visit: Payer: Self-pay | Admitting: *Deleted

## 2022-12-29 ENCOUNTER — Other Ambulatory Visit: Payer: Self-pay

## 2022-12-29 ENCOUNTER — Encounter: Payer: Self-pay | Admitting: Hematology

## 2022-12-29 ENCOUNTER — Inpatient Hospital Stay: Payer: BLUE CROSS/BLUE SHIELD

## 2022-12-29 ENCOUNTER — Encounter: Payer: Self-pay | Admitting: Hematology and Oncology

## 2022-12-29 ENCOUNTER — Inpatient Hospital Stay (HOSPITAL_BASED_OUTPATIENT_CLINIC_OR_DEPARTMENT_OTHER): Payer: BLUE CROSS/BLUE SHIELD | Admitting: Hematology and Oncology

## 2022-12-29 ENCOUNTER — Inpatient Hospital Stay: Payer: BLUE CROSS/BLUE SHIELD | Attending: Physician Assistant

## 2022-12-29 VITALS — BP 138/96 | HR 88 | Temp 98.2°F | Resp 15 | Wt 240.0 lb

## 2022-12-29 DIAGNOSIS — Z7189 Other specified counseling: Secondary | ICD-10-CM

## 2022-12-29 DIAGNOSIS — E785 Hyperlipidemia, unspecified: Secondary | ICD-10-CM | POA: Insufficient documentation

## 2022-12-29 DIAGNOSIS — C9000 Multiple myeloma not having achieved remission: Secondary | ICD-10-CM | POA: Insufficient documentation

## 2022-12-29 DIAGNOSIS — K449 Diaphragmatic hernia without obstruction or gangrene: Secondary | ICD-10-CM | POA: Insufficient documentation

## 2022-12-29 DIAGNOSIS — Z79624 Long term (current) use of inhibitors of nucleotide synthesis: Secondary | ICD-10-CM | POA: Diagnosis not present

## 2022-12-29 DIAGNOSIS — Z87891 Personal history of nicotine dependence: Secondary | ICD-10-CM | POA: Insufficient documentation

## 2022-12-29 DIAGNOSIS — K5900 Constipation, unspecified: Secondary | ICD-10-CM | POA: Diagnosis not present

## 2022-12-29 DIAGNOSIS — K219 Gastro-esophageal reflux disease without esophagitis: Secondary | ICD-10-CM | POA: Insufficient documentation

## 2022-12-29 DIAGNOSIS — N529 Male erectile dysfunction, unspecified: Secondary | ICD-10-CM | POA: Insufficient documentation

## 2022-12-29 DIAGNOSIS — I10 Essential (primary) hypertension: Secondary | ICD-10-CM | POA: Insufficient documentation

## 2022-12-29 DIAGNOSIS — Z86010 Personal history of colonic polyps: Secondary | ICD-10-CM | POA: Insufficient documentation

## 2022-12-29 DIAGNOSIS — Z800 Family history of malignant neoplasm of digestive organs: Secondary | ICD-10-CM | POA: Diagnosis not present

## 2022-12-29 DIAGNOSIS — Z5112 Encounter for antineoplastic immunotherapy: Secondary | ICD-10-CM | POA: Diagnosis present

## 2022-12-29 DIAGNOSIS — Z8042 Family history of malignant neoplasm of prostate: Secondary | ICD-10-CM | POA: Diagnosis not present

## 2022-12-29 DIAGNOSIS — Z7982 Long term (current) use of aspirin: Secondary | ICD-10-CM | POA: Diagnosis not present

## 2022-12-29 LAB — CBC WITH DIFFERENTIAL (CANCER CENTER ONLY)
Abs Immature Granulocytes: 0.03 10*3/uL (ref 0.00–0.07)
Basophils Absolute: 0.1 10*3/uL (ref 0.0–0.1)
Basophils Relative: 1 %
Eosinophils Absolute: 0 10*3/uL (ref 0.0–0.5)
Eosinophils Relative: 1 %
HCT: 38.1 % — ABNORMAL LOW (ref 39.0–52.0)
Hemoglobin: 12.8 g/dL — ABNORMAL LOW (ref 13.0–17.0)
Immature Granulocytes: 1 %
Lymphocytes Relative: 36 %
Lymphs Abs: 2.3 10*3/uL (ref 0.7–4.0)
MCH: 29 pg (ref 26.0–34.0)
MCHC: 33.6 g/dL (ref 30.0–36.0)
MCV: 86.2 fL (ref 80.0–100.0)
Monocytes Absolute: 0.8 10*3/uL (ref 0.1–1.0)
Monocytes Relative: 12 %
Neutro Abs: 3.4 10*3/uL (ref 1.7–7.7)
Neutrophils Relative %: 49 %
Platelet Count: 208 10*3/uL (ref 150–400)
RBC: 4.42 MIL/uL (ref 4.22–5.81)
RDW: 13.3 % (ref 11.5–15.5)
WBC Count: 6.6 10*3/uL (ref 4.0–10.5)
nRBC: 0 % (ref 0.0–0.2)

## 2022-12-29 LAB — CMP (CANCER CENTER ONLY)
ALT: 25 U/L (ref 0–44)
AST: 21 U/L (ref 15–41)
Albumin: 4.3 g/dL (ref 3.5–5.0)
Alkaline Phosphatase: 64 U/L (ref 38–126)
Anion gap: 7 (ref 5–15)
BUN: 9 mg/dL (ref 6–20)
CO2: 25 mmol/L (ref 22–32)
Calcium: 9 mg/dL (ref 8.9–10.3)
Chloride: 101 mmol/L (ref 98–111)
Creatinine: 0.94 mg/dL (ref 0.61–1.24)
GFR, Estimated: >60 mL/min (ref >60)
Glucose, Bld: 114 mg/dL — ABNORMAL HIGH (ref 70–99)
Potassium: 4.2 mmol/L (ref 3.5–5.1)
Sodium: 133 mmol/L — ABNORMAL LOW (ref 135–145)
Total Bilirubin: 0.5 mg/dL (ref 0.3–1.2)
Total Protein: 7 g/dL (ref 6.5–8.1)

## 2022-12-29 MED ORDER — ACETAMINOPHEN 325 MG PO TABS
650.0000 mg | ORAL_TABLET | Freq: Once | ORAL | Status: AC
  Administered 2022-12-29: 650 mg via ORAL
  Filled 2022-12-29: qty 2

## 2022-12-29 MED ORDER — ONDANSETRON HCL 8 MG PO TABS: 8.0000 mg | ORAL_TABLET | Freq: Three times a day (TID) | ORAL | 3 refills | Status: DC | PRN

## 2022-12-29 MED ORDER — DIPHENHYDRAMINE HCL 25 MG PO CAPS
50.0000 mg | ORAL_CAPSULE | Freq: Once | ORAL | Status: AC
  Administered 2022-12-29: 50 mg via ORAL
  Filled 2022-12-29: qty 2

## 2022-12-29 MED ORDER — DARATUMUMAB-HYALURONIDASE-FIHJ 1800-30000 MG-UT/15ML ~~LOC~~ SOLN
1800.0000 mg | Freq: Once | SUBCUTANEOUS | Status: AC
  Administered 2022-12-29: 1800 mg via SUBCUTANEOUS
  Filled 2022-12-29: qty 15

## 2022-12-29 MED ORDER — DEXAMETHASONE 4 MG PO TABS
40.0000 mg | ORAL_TABLET | Freq: Once | ORAL | Status: AC
  Administered 2022-12-29: 40 mg via ORAL
  Filled 2022-12-29: qty 10

## 2022-12-29 MED ORDER — FAMOTIDINE 20 MG PO TABS
20.0000 mg | ORAL_TABLET | Freq: Once | ORAL | Status: AC
  Administered 2022-12-29: 20 mg via ORAL
  Filled 2022-12-29: qty 1

## 2022-12-29 NOTE — Progress Notes (Signed)
[facility] Telephone:([phone]   [phone]  PROGRESS NOTE  Patient Care Team: Georganna Skeans, MD as PCP - General (Family Medicine) Runell Gess, MD as PCP - Cardiology (Cardiology) Jena Gauss Gerrit Friends, MD as Attending Physician (Gastroenterology)  Hematological/Oncological History # IgG Kappa Multiple Myeloma, 1p32/1q21.  01/04/2022: MRI lumbar spine shows diffuse heterogeneous marrow signal with heterogeneous enhancement throughout the thoracolumbar spine.  Additionally, there is subacute-chronic L3 vertebral body compression fraction. 01/14/2022: establish care with Dr. Leonides Schanz  02/10/2022: bone marrow biopsy shows range of plasma cell involvement from 50% on the biopsy to 60% on aspirate smear slides and close to 90% on the clot section for an overall percentage estimated at approximately 80% overall. 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  10/02/2022: Cycle 1 Day 1 of Dara/Rev/Dex 11/03/2022: Cycle 2 Day 1 of Dara/Rev/Dex 11/30/2022:  Cycle 3 Day 1 of Dara/Rev/Dex 12/29/2022: Cycle 4 Day 1 of Dara/Rev/Dex  Interval History:  [name] 52 y.o. male for continued management of IgG kappa multiple myeloma. The patient's last visit was on 12/01/2022. In the interim, he continues on dara/rev/dex. He presents today for Cycle 4, Day 1 of treatment.   On exam today Mr. Walsh reports he has been feeling good overall with occasional bouts of fatigue.   He reports his energy days about 8 out of 10.  He notes his appetite is strong though decreased from prior.  He is not having any major side effects as result of his treatment.  He denies any numbness or tingling of his fingers and toes.  He does have some occasional cramps in his hands and feet.  He reports he is doing his best to try to stay well-hydrated.  He has had no infectious symptoms such as runny nose, sore throat, or cough.  Overall he is willing and able to proceed with treatment at this time.  He has no questions concerns or complaints.  He denies fevers, chills, sweats, shortness of breath, chest pain or cough. He has no other complaints. A full 10 point ROS was otherwise negative.  MEDICAL HISTORY:  Past Medical History:  Diagnosis Date   Allergy    Anemia    Atypical chest pain    Cancer (HCC)    Multiple myeloma with normocytic anemia   GERD (gastroesophageal reflux disease)    Hepatic cyst    Benign by MRI   Hiatal hernia    History of colonic polyps    Hyperlipidemia    Hypertension    Rectal bleeding    Tobacco abuse     SURGICAL HISTORY: Past Surgical History:  Procedure Laterality Date   COLONOSCOPY  01/10/2009     RMR: Normal rectum, normal colon; repeat in 2015 due to FH of colon cancer   COLONOSCOPY N/A 01/09/2014   JYN:[id number] colonic polyps-removed as described   COLONOSCOPY N/A 09/18/2016   Procedure: COLONOSCOPY;  Surgeon: Corbin Ade, MD;  Location: [facility]  ENDO SUITE;  Service: Endoscopy;  Laterality: N/A;  730    ESOPHAGOGASTRODUODENOSCOPY  10/04/2007   RMR: Distal esophageal erosions consistent with erosive reflux esophagitis, patulous gastroesophageal junction status post passage of a  Maloney dilator, 56 [place].  Otherwise, unremarkable esophagus.  Hiatal hernia.  Otherwise normal stomach.  Bulbar erosion   ESOPHAGOGASTRODUODENOSCOPY N/A 01/09/2014   Erosive reflux esophagitis. Small hiatal hernia   HUMERUS IM NAIL Left 02/22/2022   Procedure:  INTRAMEDULLARY (IM) NAIL HUMERAL;  Surgeon: Yolonda Kida, MD;  Location: [facility] OR;  Service: Orthopedics;  Laterality: Left;   I & D EXTREMITY Left 07/26/2018   Procedure: IRRIGATION AND DEBRIDEMENT EXTREMITY;  Surgeon: Knute Neu, MD;  Location: [facility] OR;  Service: Plastics;  Laterality: Left;   IR BONE TUMOR(S)RF ABLATION  02/05/2022   IR BONE TUMOR(S)RF ABLATION  02/24/2022   IR KYPHO EA ADDL LEVEL THORACIC OR LUMBAR  02/19/2022   IR KYPHO LUMBAR INC FX REDUCE BONE BX UNI/BIL CANNULATION INC/IMAGING  02/05/2022   IR KYPHO LUMBAR INC FX REDUCE BONE BX UNI/BIL CANNULATION INC/IMAGING  02/19/2022   PERCUTANEOUS PINNING Left 07/26/2018   Procedure: PERCUTANEOUS PINNING EXTREMITY;  Surgeon: Knute Neu, MD;  Location: [facility] OR;  Service: Plastics;  Laterality: Left;    SOCIAL HISTORY: Social History   Socioeconomic History   Marital status: Divorced    Spouse name: Not on file   Number of children: 2   Years of education: Not on file   Highest education level: Not on file  Occupational History   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[employer]    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[employer]  Tobacco Use   Smoking status: Former    Current packs/day: 0.00    Average packs/day: 0.3 packs/day for 10.0 years (2.5 ttl pk-yrs)    Types: Cigarettes    Start date: 12/25/2011    Quit date: 12/24/2021    Years since quitting: 1.0   Smokeless tobacco: Never  Vaping Use   Vaping status: Never Used  Substance and Sexual Activity   Alcohol use: Yes    Alcohol/week: 0.0 standard drinks of alcohol    Comment: occasional/wine on the weekends   Drug use: No   Sexual activity: Not on file  Other Topics Concern   Not on file  Social History Narrative   Right handed   Lives alone one story home   Social Determinants of Health   Financial Resource Strain: Not on file  Food Insecurity: No Food Insecurity (07/17/2022)   Hunger Vital Sign    Worried About Running Out of Food in the Last Year:  Never true    Ran Out of Food in the Last Year: Never true  Transportation Needs: No Transportation Needs (07/17/2022)   PRAPARE - Transportation    Lack of Transportation (Medical): No    Lack of Transportation (Non-Medical): No  Physical Activity: Not on file  Stress: Not on file  Social Connections: Not on file  Intimate Partner Violence: Not At Risk (07/17/2022)   Humiliation, Afraid, Rape, and Kick questionnaire    Fear of Current or Ex-Partner: No    Emotionally Abused: No    Physically Abused: No    Sexually Abused: No    FAMILY HISTORY: Family History  Problem Relation Age of Onset   Heart disease Mother    Hypertension Mother    Diabetes Father    Hypertension Father    Colon cancer Sister        passed away  from colon cancer, in her 43s   Heart attack Maternal Uncle    Prostate cancer Maternal Uncle    Diabetes Other    Pancreatic cancer Neg Hx    Rectal cancer Neg Hx    Esophageal cancer Neg Hx    Stomach cancer Neg Hx     ALLERGIES:  has No Known Allergies.  MEDICATIONS:  Current Outpatient Medications  Medication Sig Dispense Refill   acyclovir (ZOVIRAX) 400 MG tablet Take 1 tablet by mouth twice daily 60 tablet 0   allopurinol (ZYLOPRIM) 300 MG tablet Take 1 tablet by mouth once daily 30 tablet 0   aspirin EC 81 MG tablet Take 81 mg by mouth daily. Swallow whole.     cetirizine (ZYRTEC) 10 MG tablet Take 10 mg by mouth daily as needed for allergies.     clonazePAM (KLONOPIN) 0.5 MG tablet Take 1 tablet (0.5 mg total) by mouth 2 (two) times daily as needed for anxiety. 15 tablet 0   docusate sodium (COLACE) 100 MG capsule Take 1 capsule (100 mg total) by mouth 2 (two) times daily as needed for mild constipation. 30 capsule 3   DULoxetine (CYMBALTA) 20 MG capsule Take 1 capsule by mouth once daily 90 capsule 1   gabapentin (NEURONTIN) 100 MG capsule Take 1 capsule by mouth twice daily 60 capsule 0   hydrochlorothiazide (HYDRODIURIL) 25 MG tablet TAKE 1 TABLET  BY MOUTH ONCE DAILY(GIVEN ENOUGH REFILL TO LAST UNTIL APPOINTMENT.MUST ATTEND APPT.FOR FURTHER REFILLS.) 38 tablet 0   lisinopril (ZESTRIL) 40 MG tablet Take 1 tablet by mouth once daily 90 tablet 0   Methocarbamol 1000 MG TABS Take 1,000 mg by mouth every 6 (six) hours as needed. (Patient taking differently: Take 1,000 mg by mouth every 6 (six) hours as needed (muscle spasms).) 45 tablet 1   morphine (MS CONTIN) 30 MG 12 hr tablet Take 1 tablet (30 mg total) by mouth every 12 (twelve) hours. 60 tablet 0   ondansetron (ZOFRAN) 8 MG tablet Take 1 tablet (8 mg total) by mouth every 8 (eight) hours as needed for nausea or vomiting. 60 tablet 3   pantoprazole (PROTONIX) 40 MG tablet Take 1 tablet by mouth twice daily 60 tablet 0   polyethylene glycol powder (GLYCOLAX/MIRALAX) 17 GM/SCOOP powder Take 1 capful (17 g) with water by mouth daily. (Patient taking differently: Take 17 g by mouth daily as needed for mild constipation.) 238 g 0   prochlorperazine (COMPAZINE) 10 MG tablet Take 1 tablet (10 mg total) by mouth every 6 (six) hours as needed for nausea or vomiting. 60 tablet 0   REVLIMID 25 MG capsule Take 1 capsule (25 mg total) by mouth daily. Take one capsule daily x 14 days. None for following 7 days Auth # [id number] Date obtained  12/10/22 14 capsule 0   rosuvastatin (CRESTOR) 10 MG tablet Take 1 tablet (10 mg total) by mouth daily. 90 tablet 1   sildenafil (VIAGRA) 50 MG tablet Take 1 tablet (50 mg total) by mouth daily as needed for erectile dysfunction. 10 tablet 0   No current facility-administered medications for this visit.    REVIEW OF SYSTEMS:   Constitutional: ( - ) fevers, ( - )  chills , ( - ) night sweats Eyes: ( - ) blurriness of vision, ( - ) double vision, ( - ) watery eyes Ears, nose, mouth, throat, and face: ( - ) mucositis, ( - ) sore throat Respiratory: ( - ) cough, ( - ) dyspnea, ( - )  wheezes Cardiovascular: ( - ) palpitation, ( - ) chest discomfort, ( - ) lower extremity  swelling Gastrointestinal:  ( - ) nausea, ( +) heartburn, ( - ) change in bowel habits Skin: ( - ) abnormal skin rashes Lymphatics: ( - ) new lymphadenopathy, ( - ) easy bruising Neurological: ( - ) numbness, ( - ) tingling, ( - ) new weaknesses Behavioral/Psych: ( - ) mood change, ( - ) new changes  All other systems were reviewed with the patient and are negative.  PHYSICAL EXAMINATION: ECOG PERFORMANCE STATUS: 1 - Symptomatic but completely ambulatory  Vitals:   12/29/22 1044  BP: (!) 138/96  Pulse: 88  Resp: 15  Temp: 98.2 F (36.8 C)  SpO2: 100%        Filed Weights   12/29/22 1044  Weight: 240 lb (108.9 kg)        GENERAL: Well-appearing young African-American male, alert, no distress and comfortable SKIN: skin color, texture, turgor are normal, no rashes or significant lesions EYES: conjunctiva are pink and non-injected, sclera clear. Mild residual swelling in right eyelid with surround erythema. No purulent discharge noted.  LUNGS: clear to auscultation and percussion with normal breathing effort HEART: regular rate & rhythm and no murmurs and no lower extremity edema Musculoskeletal: no cyanosis of digits and no clubbing  PSYCH: alert & oriented x 3, fluent speech NEURO: no focal motor/sensory deficits  LABORATORY DATA:  I have reviewed the data as listed    Latest Ref Rng & Units 12/29/2022   10:28 AM 12/16/2022    1:07 PM 12/01/2022    9:42 AM  CBC  WBC 4.0 - 10.5 K/uL 6.6  4.1  5.6   Hemoglobin 13.0 - 17.0 g/dL 65.7  84.6  96.2   Hematocrit 39.0 - 52.0 % 38.1  38.5  41.0   Platelets 150 - 400 K/uL 208  223  239        Latest Ref Rng & Units 12/29/2022   10:28 AM 12/16/2022    1:07 PM 12/01/2022    9:42 AM  CMP  Glucose 70 - 99 mg/dL 952  841  324   BUN 6 - 20 mg/dL 9  7  12    Creatinine 0.61 - 1.24 mg/dL 4.01  0.27  2.53   Sodium 135 - 145 mmol/L 133  136  132   Potassium 3.5 - 5.1 mmol/L 4.2  3.9  4.0   Chloride 98 - 111 mmol/L 101  101  99    CO2 22 - 32 mmol/L 25  29  24    Calcium 8.9 - 10.3 mg/dL 9.0  9.5  9.3   Total Protein 6.5 - 8.1 g/dL 7.0  6.6  7.2   Total Bilirubin 0.3 - 1.2 mg/dL 0.5  0.7  0.6   Alkaline Phos 38 - 126 U/L 64  69  63   AST 15 - 41 U/L 21  19  22    ALT 0 - 44 U/L 25  23  24      Lab Results  Component Value Date   MPROTEIN 0.2 (H) 12/01/2022   MPROTEIN 0.2 (H) 11/03/2022   MPROTEIN 0.3 (H) 09/17/2022   Lab Results  Component Value Date   KPAFRELGTCHN 9.9 12/01/2022   KPAFRELGTCHN 11.2 11/03/2022   KPAFRELGTCHN 19.2 09/17/2022   LAMBDASER 8.8 12/01/2022   LAMBDASER 8.5 11/03/2022   LAMBDASER 9.8 09/17/2022   KAPLAMBRATIO 1.13 12/01/2022   KAPLAMBRATIO 1.32 11/03/2022   KAPLAMBRATIO 1.96 (H) 09/17/2022  RADIOGRAPHIC STUDIES: No results found.  ASSESSMENT & PLAN EYOB WORM 52 y.o. male with medical history significant for IgG kappa multiple myeloma who presents for a follow up visit.   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 -- Recommend VRD chemotherapy with intention of proceeding to transplant. -- Labs at each visit to include CBC, CMP, LDH with monthly restaging labs SPEP and serum free light chains -- Started VRD therapy on 02/26/2022. Held Velcade therapy on 09/24/2022 due to development of ocular toxicity. --Switched to Dara/Rev/Dex on 10/02/2022 PLAN: --Due for Cycle 4, Day 1 of Dara/Rev/Dex.  --Labs today reviewed and adequate for treatment. WBC 6.6, hemoglobin 12.8, MCV 86.2, and platelets of 208 --Most recent myeloma labs from 11/02/2021 detected M protein at 0.2 g/dL. Kappa light chain 9.9, Lambda light chain 8.8, Ratio 1.13 --Proceed with treatment today without any dose modifications.  --Patient's M protein is nearing the point where we can make a referral to bone marrow transplant center.  Will make referral to [facility] per patient request today. --RTC for weekly treatments with q 2 week toxicity checks.   #Erectile Dysfunction -- Likely secondary to chemotherapy. --prescribed Viagra 50 mg p.o. as needed x 10 pills. -- If ineffective could consider Cialis.  # Leg Discomfort/Restless Leg -- Patient provided with Requip by the emergency department, discontinued due to poor efficacy. --Evaluated by Dr. Barbaraann Cao on 07/28/2022. Recommended to short course of steroids so started prednisone 50 mg daily x 7 days. --Discussed etiologies including iron deficiency so iron levels were checked and was normal.   #Constipation--improving: --Recommend to continue on colace but add miralax as well.   #Pathologic fractures-secondary to MM: --Involving L1, L2 and L3 compression fracture and left humerus fracture.  --Underwent kyphoplasty of L3 fracture on 02/19/2022 --Underwent medullary nailing of left humeral shaft on 02/22/2022 --Underwent kyphoplasty on 06/04/2022.   #Pain Medication -- Per palliative care patient has weaned off MS contin. Currently conitnues on gabapentin 100 mg BID, cymbalta 20 mg and robaxin as needed.  --Under the care of palliative care team.   #Supportive Care -- chemotherapy education complete -- port placement not required -- zofran 8mg  q8H PRN and compazine 10mg  PO q6H for nausea -- acyclovir 400mg  PO BID for VCZ prophylaxis -- allopurinol 300mg  PO daily for TLS prophylaxis -- Dental clearance for Zometa/Denosumab approved.  Next dose is due today.   No orders of the defined types were placed in this encounter.   All questions were answered. The patient knows to call the clinic with any problems, questions or concerns.  I have spent a total of 30 minutes minutes of face-to-face and non-face-to-face time, preparing to see the patient,performing a medically appropriate examination, counseling and educating the patient, documenting clinical information in the electronic  health record,  and care coordination.  Ulysees Barns, MD Department of Hematology/Oncology [facility] Phone: [phone] Pager: [phone] Email: [email or web address]

## 2022-12-29 NOTE — Patient Instructions (Signed)
[facility]  Discharge Instructions: Thank you for choosing [facility] to provide your oncology and hematology care.   If you have a lab appointment with the Cancer Center, please go directly to the Cancer Center and check in at the registration area.   Wear comfortable clothing and clothing appropriate for easy access to any Portacath or PICC line.   We strive to give you quality time with your provider. You may need to reschedule your appointment if you arrive late (15 or more minutes).  Arriving late affects you and other patients whose appointments are after yours.  Also, if you miss three or more appointments without notifying the office, you may be dismissed from the clinic at the provider's discretion.      For prescription refill requests, have your pharmacy contact our office and allow 72 hours for refills to be completed.    Today you received the following chemotherapy and/or immunotherapy agents darzalex faspro, xgeva      To help prevent nausea and vomiting after your treatment, we encourage you to take your nausea medication as directed.  BELOW ARE SYMPTOMS THAT SHOULD BE REPORTED IMMEDIATELY: *FEVER GREATER THAN 100.4 F (38 C) OR HIGHER *CHILLS OR SWEATING *NAUSEA AND VOMITING THAT IS NOT CONTROLLED WITH YOUR NAUSEA MEDICATION *UNUSUAL SHORTNESS OF BREATH *UNUSUAL BRUISING OR BLEEDING *URINARY PROBLEMS (pain or burning when urinating, or frequent urination) *BOWEL PROBLEMS (unusual diarrhea, constipation, pain near the anus) TENDERNESS IN MOUTH AND THROAT WITH OR WITHOUT PRESENCE OF ULCERS (sore throat, sores in mouth, or a toothache) UNUSUAL RASH, SWELLING OR PAIN  UNUSUAL VAGINAL DISCHARGE OR ITCHING   Items with * indicate a potential emergency and should be followed up as soon as possible or go to the Emergency Department if any problems should occur.  Please show the CHEMOTHERAPY ALERT CARD or IMMUNOTHERAPY ALERT  CARD at check-in to the Emergency Department and triage nurse.  Should you have questions after your visit or need to cancel or reschedule your appointment, please contact [facility]  Dept: [phone]  and follow the prompts.  Office hours are 8:00 a.m. to 4:30 p.m. Monday - Friday. Please note that voicemails left after 4:00 p.m. may not be returned until the following business day.  We are closed weekends and major holidays. You have access to a nurse at all times for urgent questions. Please call the main number to the clinic Dept: [phone] and follow the prompts.   For any non-urgent questions, you may also contact your provider using MyChart. We now offer e-Visits for anyone 46 and older to request care online for non-urgent symptoms. For details visit [email or web address].   Also download the MyChart app! Go to the app store, search "MyChart", open the app, select [place], and log in with your MyChart username and password.

## 2023-01-05 ENCOUNTER — Encounter: Payer: Self-pay | Admitting: Physician Assistant

## 2023-01-05 ENCOUNTER — Encounter: Payer: Self-pay | Admitting: Hematology and Oncology

## 2023-01-07 ENCOUNTER — Encounter: Payer: Self-pay | Admitting: Physician Assistant

## 2023-01-07 ENCOUNTER — Telehealth: Payer: BLUE CROSS/BLUE SHIELD | Admitting: Physician Assistant

## 2023-01-07 ENCOUNTER — Telehealth: Payer: Self-pay | Admitting: *Deleted

## 2023-01-07 ENCOUNTER — Ambulatory Visit: Payer: Self-pay

## 2023-01-07 DIAGNOSIS — U071 COVID-19: Secondary | ICD-10-CM

## 2023-01-07 MED ORDER — NIRMATRELVIR/RITONAVIR (PAXLOVID)TABLET
3.0000 | ORAL_TABLET | Freq: Two times a day (BID) | ORAL | 0 refills | Status: AC
Start: 2023-01-07 — End: 2023-01-12

## 2023-01-07 NOTE — Patient Instructions (Signed)
You are going to take Paxlovid as directed.  Make sure that you are staying well-hydrated and get plenty of rest.  I hope that you feel better soon, please let us know if there is anything else we can do for you.  Roney Jaffe, PA-C Physician Assistant [facility] [email or web address]   Infection Prevention in the Home If you have an infection, may have been exposed to an infection, or are taking care of someone who has an infection, it is important to know how to keep the infection from spreading. Follow your health care provider's instructions and use these guidelines to help stop the spread of infection. How infections are spread In order for an infection to spread, the following must be present: A germ. This may be a virus, bacteria, fungus, or parasite. A place for the germ to live. This may be: On or in a person, animal, plant, or food. In soil or water. On surfaces, such as a door handle. A person or animal who can develop a disease if the germ enters the body (host). The host does not have resistance to the germ. A way for the germ to enter the host. This may occur by: Direct contact with an infected person or animal. This can happen through shaking hands or hugging. Some germs can also travel through the air and spread to others. This can happen when an infected person coughs or sneezes on or near other people. Indirect contact. This occurs when the germ enters the host through contact with an infected object. Examples include: Eating or drinking food or water that is contaminated with the germ. Touching a contaminated surface with your hands, and then touching your face, eyes, nose, or mouth. Supplies needed: Soap. Alcohol-based hand sanitizer. Standard cleaning products. Disinfectants, such as bleach. Reusable cleaning cloths, sponges, or paper towels. Disposable or reusable utility gloves. How to prevent infection  from spreading There are several things that you can do to help prevent infection from spreading. Take these general actions Everyone should take the following actions to prevent the spread of infection: Wash your hands often with soap and water for at least 20 seconds. If soap and water are not available, use alcohol-based hand sanitizer. Avoid touching your face, mouth, nose, or eyes. Cough or sneeze into a tissue, sleeve, or elbow instead of into your hand or into the air. If you cough or sneeze into a tissue, throw it away immediately and wash your hands.  Keep your bathroom clean Provide soap. Change towels and washcloths frequently. Change toothbrushes often and store them separately in a clean, dry place. Clean and disinfect all surfaces, including the toilet, floor, tub, shower, and sink. Do not share personal items, such as razors, toothbrushes, deodorant, combs, brushes, towels, and washcloths. Maintain hygiene in the [facility] your hands before and after preparing food and before you eat. Clean the inside of your refrigerator each week. Keep your refrigerator set at 28F (4C) or less, and set your freezer at 30F (-18C) or less. Keep work surfaces clean. Disinfect them regularly. Wash your dishes in hot, soapy water. Air-dry your dishes or use a dishwasher. Do not share dishes or eating utensils. Handle food safely Store food carefully. Refrigerate leftovers promptly in covered containers. Throw out stale or spoiled food. Thaw foods in the refrigerator or microwave, not at room temperature. Serve foods at the proper temperature. Do not eat raw meat. Make sure it is cooked to the appropriate temperature. Cook eggs  until they are firm. Wash fruits and vegetables under running water. Use separate cutting boards, plates, and utensils for raw foods and cooked foods. Use a clean spoon each time you sample food while cooking. Do laundry the right way Wear gloves if laundry  is visibly soiled. Do not shake soiled laundry. Doing that may send germs into the air. Wash laundry in hot water. If you cannot wash the laundry right away, place it in a plastic bag and wash it as soon as possible. Be careful around animals and pets Wash your hands before and after touching animals. If you have a pet, ensure that your pet stays clean. Do not let people with weak immune systems touch bird droppings, fish tank water, or a litter box. If you have a pet cage or litter box, be sure to clean it every day. If you are sick, stay away from animals and have someone else care for them if possible. How to clean and disinfect objects and surfaces Precautions Some disinfectants work for certain germs and not others. Read the manufacturer's instructions or read online resources to determine if the product you are using will work for the germ you are trying to remove. If you choose to use bleach, use it safely. Never mix it with other cleaning products, especially those that contain ammonia. This mixture can create a dangerous gas that may be deadly. Keep proper movement of fresh air in your home (ventilation). Pour used mop water down the utility sink or toilet. Do not pour this water down the kitchen sink. Objects and surfaces  If surfaces are visibly soiled, clean them first with soap and water before disinfecting. Disinfect surfaces that are frequently touched every day. This may include: Counters. Tables. Doorknobs. Sinks and faucets. Electronics, such as: Engineer, technical sales. Remote controls. Keyboards. Computers and tablets. Cleaning supplies Some cleaning supplies can breed germs. Take good care of them to prevent germs from spreading. To do this: Soak toilet brushes, mops, and sponges in bleach and water for 5 minutes after each use, or according to manufacturer's instructions. Wash reusable cleaning cloths and sanitize sponges after each use. Throw away disposable gloves after one  use. Replace reusable utility gloves if they are cracked or torn or if they start to peel. Additional actions if you are sick If you live with other people:  Avoid close contact with those around you. Stay at least 3 ft (1 m) away from others, if possible. Use a separate bathroom, if possible. If possible, sleep in a separate bedroom or in a separate bed to prevent infecting other household members. Change bedroom linens each week or whenever they are soiled. Have everyone in the household wash hands often with soap and water for at least 20 seconds. If soap and water are not available, use alcohol-based hand sanitizer. In general: Stay home except to get medical care. Call ahead before visiting your health care provider. Ask others to get groceries and household supplies and to refill prescriptions for you. Avoid public areas. Try not to take public transportation. If you can, wear a mask if you need to go out of the house, or if you are in close contact with someone who is not sick. Avoid visitors until you have completely recovered, or until you have no signs and symptoms of infection. Avoid preparing food or providing care for others. If you must prepare food or provide care for others, wear a mask and wash your hands before and after doing these things. Where  to find more information Centers for Disease Control and Prevention: Toner[employer].no Summary It is important to know how to keep infection from spreading. Make sure everyone in your household washes their hands often with soap and water. Disinfect surfaces that are frequently touched every day. If you are sick, stay home except to get medical care. This information is not intended to replace advice given to you by your health care provider. Make sure you discuss any questions you have with your health care provider. Document Revised: 07/01/2021 Document Reviewed: 07/01/2021 Elsevier Patient Education  2024 [employer].

## 2023-01-07 NOTE — Progress Notes (Signed)
Established Patient Office Visit  Subjective   Patient ID: [name], male    DOB: [birth year]  Age: 52 y.o. MRN: [id number]  Chief Complaint  Patient presents with   Covid Positive   Virtual Visit via Video Note  I connected with [name] on 01/07/23 at  3:20 PM EDT by a video enabled telemedicine application and verified that I am speaking with the correct person using two identifiers.  Location: Patient: Car  Provider: [facility]    I discussed the limitations of evaluation and management by telemedicine and the availability of in person appointments. The patient expressed understanding and agreed to proceed.  History of Present Illness: States that he started feeling "stuffy" last night and again this morning.  States that he is currently being treated for multiple myeloma.  States that he did take a home COVID test this morning which was positive.  Is unsure of sick contacts.  Has not tried anything for relief.   Observations/Objective: Medical history and current medications reviewed, no physical exam completed   Past Medical History:  Diagnosis Date   Allergy    Anemia    Atypical chest pain    Cancer (HCC)    Multiple myeloma with normocytic anemia   GERD (gastroesophageal reflux disease)    Hepatic cyst    Benign by MRI   Hiatal hernia    History of colonic polyps    Hyperlipidemia    Hypertension    Rectal bleeding    Tobacco abuse    Social History   Socioeconomic History   Marital status: Divorced    Spouse name: Not on file   Number of children: 2   Years of education: Not on file   Highest education level: Not on file  Occupational History   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[employer]    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[employer]  Tobacco Use   Smoking status: Former    Current packs/day: 0.00    Average packs/day: 0.3 packs/day for 10.0 years (2.5 ttl pk-yrs)    Types: Cigarettes    Start date:  12/25/2011    Quit date: 12/24/2021    Years since quitting: 1.0   Smokeless tobacco: Never  Vaping Use   Vaping status: Never Used  Substance and Sexual Activity   Alcohol use: Yes    Alcohol/week: 0.0 standard drinks of alcohol    Comment: occasional/wine on the weekends   Drug use: No   Sexual activity: Not on file  Other Topics Concern   Not on file  Social History Narrative   Right handed   Lives alone one story home   Social Determinants of Health   Financial Resource Strain: Not on file  Food Insecurity: No Food Insecurity (07/17/2022)   Hunger Vital Sign    Worried About Running Out of Food in the Last Year: Never true    Ran Out of Food in the Last Year: Never true  Transportation Needs: No Transportation Needs (07/17/2022)   PRAPARE - Transportation    Lack of Transportation (Medical): No    Lack of Transportation (Non-Medical): No  Physical Activity: Not on file  Stress: Not on file  Social Connections: Not on file  Intimate Partner Violence: Not At Risk (07/17/2022)   Humiliation, Afraid, Rape, and Kick questionnaire    Fear of Current or Ex-Partner: No    Emotionally Abused: No    Physically Abused: No    Sexually  Abused: No   Family History  Problem Relation Age of Onset   Heart disease Mother    Hypertension Mother    Diabetes Father    Hypertension Father    Colon cancer Sister        passed away from colon cancer, in her 30s   Heart attack Maternal Uncle    Prostate cancer Maternal Uncle    Diabetes Other    Pancreatic cancer Neg Hx    Rectal cancer Neg Hx    Esophageal cancer Neg Hx    Stomach cancer Neg Hx    No Known Allergies  Review of Systems  Constitutional:  Negative for chills and fever.  HENT:  Positive for congestion. Negative for sinus pain and sore throat.   Eyes: Negative.   Respiratory:  Negative for cough, sputum production and shortness of breath.   Cardiovascular:  Negative for chest pain.  Gastrointestinal:  Negative for  abdominal pain, nausea and vomiting.  Genitourinary: Negative.   Musculoskeletal:  Negative for myalgias.  Skin: Negative.   Neurological:  Negative for headaches.  Endo/Heme/Allergies: Negative.   Psychiatric/Behavioral: Negative.       Assessment & Plan:   Problem List Items Addressed This Visit   None Visit Diagnoses     COVID-19    -  Primary   Relevant Medications   nirmatrelvir/ritonavir (PAXLOVID) 20 x 150 MG & 10 x 100MG  TABS      Assessment and Plan: 1. COVID-19 Patient currently treating and for multiple myeloma, reasonable for trial of Paxlovid.  GFR within normal limits.  Patient education given on supportive care, red flags given for prompt reevaluation.  Proper masking and isolation guidelines discussed. - nirmatrelvir/ritonavir (PAXLOVID) 20 x 150 MG & 10 x 100MG  TABS; Take 3 tablets by mouth 2 (two) times daily for 5 days. (Take nirmatrelvir 150 mg two tablets twice daily for 5 days and ritonavir 100 mg one tablet twice daily for 5 days) Patient GFR is >60  Dispense: 30 tablet; Refill: 0   Follow Up Instructions:    I discussed the assessment and treatment plan with the patient. The patient was provided an opportunity to ask questions and all were answered. The patient agreed with the plan and demonstrated an understanding of the instructions.   The patient was advised to call back or seek an in-person evaluation if the symptoms worsen or if the condition fails to improve as anticipated.  I provided 12 minutes of non-face-to-face time during this encounter.    Return if symptoms worsen or fail to improve.    Kasandra Knudsen Mayers, PA-C

## 2023-01-07 NOTE — Telephone Encounter (Signed)
Summary: covid positive   Pt is covid positive.  Thought he had a head cold.  Hoping to get paxlovid.  If he needs appt, there are none at [facility].  He is willing to go to mobile unit if he needs to.  The sooner he knows, the better. That is only reason I did high priority.         Chief Complaint: COVID positive, mild congestion. Asking for Paxlovid. Symptoms: Above Frequency: Today Pertinent Negatives: Patient denies  Disposition: [] ED /[] Urgent Care (no appt availability in office) / [] Appointment(In office/virtual)/ []  [facility]/ [x] Home Care/ [] Refused Recommended Disposition /[x] [place] [facility]/ []  Follow-up with PCP Additional Notes: Pt. Going to mobile unit to get Paxlovid.  Reason for Disposition  [1] COVID-19 diagnosed by positive lab test (e.g., PCR, rapid self-test kit) AND [2] mild symptoms (e.g., cough, fever, others) AND [3] no complications or SOB  Answer Assessment - Initial Assessment Questions 1. COVID-19 DIAGNOSIS: "How do you know that you have COVID?" (e.g., positive lab test or self-test, diagnosed by doctor or NP/PA, symptoms after exposure).     Home test 2. COVID-19 EXPOSURE: "Was there any known exposure to COVID before the symptoms began?" CDC Definition of close contact: within 6 feet (2 meters) for a total of 15 minutes or more over a 24-hour period.      N/a 3. ONSET: "When did the COVID-19 symptoms start?"      Today 4. WORST SYMPTOM: "What is your worst symptom?" (e.g., cough, fever, shortness of breath, muscle aches)     Congestion 5. COUGH: "Do you have a cough?" If Yes, ask: "How bad is the cough?"       No 6. FEVER: "Do you have a fever?" If Yes, ask: "What is your temperature, how was it measured, and when did it start?"     No 7. RESPIRATORY STATUS: "Describe your breathing?" (e.g., normal; shortness of breath, wheezing, unable to speak)      No 8. BETTER-SAME-WORSE: "Are you getting better, staying the same or getting worse  compared to yesterday?"  If getting worse, ask, "In what way?"     Worse 9. OTHER SYMPTOMS: "Do you have any other symptoms?"  (e.g., chills, fatigue, headache, loss of smell or taste, muscle pain, sore throat)     No 10. HIGH RISK DISEASE: "Do you have any chronic medical problems?" (e.g., asthma, heart or lung disease, weak immune system, obesity, etc.)       Yes 11. VACCINE: "Have you had the COVID-19 vaccine?" If Yes, ask: "Which one, how many shots, when did you get it?"       N/a 12. PREGNANCY: "Is there any chance you are pregnant?" "When was your last menstrual period?"       N/a 13. O2 SATURATION MONITOR:  "Do you use an oxygen saturation monitor (pulse oximeter) at home?" If Yes, ask "What is your reading (oxygen level) today?" "What is your usual oxygen saturation reading?" (e.g., 95%)       No  Protocols used: Coronavirus (COVID-19) Diagnosed or Suspected-A-AH

## 2023-01-07 NOTE — Telephone Encounter (Signed)
Received call from pt. He states he has tested + for Covid. Advised that he can still come in for his treatment on 01/12/23 but that we have to change the time to later in the day. Advised he is to wait outside when he arrives and to call [phone]. He is to let the receptionist know he is Covid + and that a nurse needs to come to get him to bring him to infusion. Advised that he will get hi labs. MD visit and infusion all in a private room in the infusion area. This will occur for this week and the next treatment on 01/27/23. Advised that the time of his appt on 01/12/23 will be changed to later in the day. Pt voiced understanding.  [name] has contacted his PCP about his + Covid test

## 2023-01-08 ENCOUNTER — Telehealth: Payer: Self-pay | Admitting: Pharmacy Technician

## 2023-01-08 ENCOUNTER — Other Ambulatory Visit: Payer: Self-pay | Admitting: *Deleted

## 2023-01-08 ENCOUNTER — Other Ambulatory Visit (HOSPITAL_COMMUNITY): Payer: Self-pay

## 2023-01-08 DIAGNOSIS — C9000 Multiple myeloma not having achieved remission: Secondary | ICD-10-CM

## 2023-01-08 MED ORDER — REVLIMID 25 MG PO CAPS
25.0000 mg | ORAL_CAPSULE | Freq: Every day | ORAL | 0 refills | Status: DC
Start: 2023-01-08 — End: 2023-01-14

## 2023-01-08 NOTE — Telephone Encounter (Signed)
Oral Oncology Patient Advocate Encounter  Received email from pharmacy liaison, Rodell Perna, stating that Revlimid can no longer be filled with [facility].  Called [employer], patient's PBM, to confirm which specialty pharmacy the patient is required to change to.  Per the plan rep, Rocky Link, patient must now fill their Revlimid at [facility].  I have requested the rx be resent to this pharmacy.  Jinger Neighbors, CPhT-Adv Oncology Pharmacy Patient Advocate [facility] Direct Number: [phone]  Fax: [phone]

## 2023-01-12 ENCOUNTER — Encounter: Payer: Self-pay | Admitting: Nurse Practitioner

## 2023-01-12 ENCOUNTER — Other Ambulatory Visit: Payer: BLUE CROSS/BLUE SHIELD

## 2023-01-12 ENCOUNTER — Inpatient Hospital Stay: Payer: BLUE CROSS/BLUE SHIELD

## 2023-01-12 ENCOUNTER — Inpatient Hospital Stay (HOSPITAL_BASED_OUTPATIENT_CLINIC_OR_DEPARTMENT_OTHER): Payer: BLUE CROSS/BLUE SHIELD | Admitting: Hematology and Oncology

## 2023-01-12 ENCOUNTER — Other Ambulatory Visit: Payer: Self-pay

## 2023-01-12 ENCOUNTER — Inpatient Hospital Stay: Payer: BLUE CROSS/BLUE SHIELD | Admitting: Nurse Practitioner

## 2023-01-12 VITALS — BP 132/102 | HR 92 | Temp 98.1°F | Resp 16 | Ht 69.0 in | Wt 239.5 lb

## 2023-01-12 DIAGNOSIS — Z7189 Other specified counseling: Secondary | ICD-10-CM | POA: Diagnosis not present

## 2023-01-12 DIAGNOSIS — Z515 Encounter for palliative care: Secondary | ICD-10-CM | POA: Diagnosis not present

## 2023-01-12 DIAGNOSIS — C9000 Multiple myeloma not having achieved remission: Secondary | ICD-10-CM | POA: Diagnosis not present

## 2023-01-12 DIAGNOSIS — G893 Neoplasm related pain (acute) (chronic): Secondary | ICD-10-CM | POA: Diagnosis not present

## 2023-01-12 LAB — CBC WITH DIFFERENTIAL (CANCER CENTER ONLY)
Abs Immature Granulocytes: 0.02 10*3/uL (ref 0.00–0.07)
Basophils Absolute: 0 10*3/uL (ref 0.0–0.1)
Basophils Relative: 0 %
Eosinophils Absolute: 0.1 10*3/uL (ref 0.0–0.5)
Eosinophils Relative: 1 %
HCT: 43.5 % (ref 39.0–52.0)
Hemoglobin: 14.4 g/dL (ref 13.0–17.0)
Immature Granulocytes: 0 %
Lymphocytes Relative: 41 %
Lymphs Abs: 2.9 10*3/uL (ref 0.7–4.0)
MCH: 28.9 pg (ref 26.0–34.0)
MCHC: 33.1 g/dL (ref 30.0–36.0)
MCV: 87.2 fL (ref 80.0–100.0)
Monocytes Absolute: 0.4 10*3/uL (ref 0.1–1.0)
Monocytes Relative: 6 %
Neutro Abs: 3.6 10*3/uL (ref 1.7–7.7)
Neutrophils Relative %: 52 %
Platelet Count: 295 10*3/uL (ref 150–400)
RBC: 4.99 MIL/uL (ref 4.22–5.81)
RDW: 13.3 % (ref 11.5–15.5)
WBC Count: 7 10*3/uL (ref 4.0–10.5)
nRBC: 0 % (ref 0.0–0.2)

## 2023-01-12 LAB — CMP (CANCER CENTER ONLY)
ALT: 22 U/L (ref 0–44)
AST: 19 U/L (ref 15–41)
Albumin: 4.6 g/dL (ref 3.5–5.0)
Alkaline Phosphatase: 63 U/L (ref 38–126)
Anion gap: 9 (ref 5–15)
BUN: 12 mg/dL (ref 6–20)
CO2: 25 mmol/L (ref 22–32)
Calcium: 10 mg/dL (ref 8.9–10.3)
Chloride: 102 mmol/L (ref 98–111)
Creatinine: 1.02 mg/dL (ref 0.61–1.24)
GFR, Estimated: >60 mL/min (ref >60)
Glucose, Bld: 118 mg/dL — ABNORMAL HIGH (ref 70–99)
Potassium: 4.2 mmol/L (ref 3.5–5.1)
Sodium: 136 mmol/L (ref 135–145)
Total Bilirubin: 0.4 mg/dL (ref 0.3–1.2)
Total Protein: 8 g/dL (ref 6.5–8.1)

## 2023-01-12 MED ORDER — DEXAMETHASONE 4 MG PO TABS
40.0000 mg | ORAL_TABLET | Freq: Once | ORAL | Status: AC
  Administered 2023-01-12: 40 mg via ORAL
  Filled 2023-01-12: qty 10

## 2023-01-12 MED ORDER — FAMOTIDINE 20 MG PO TABS
20.0000 mg | ORAL_TABLET | Freq: Once | ORAL | Status: AC
  Administered 2023-01-12: 20 mg via ORAL
  Filled 2023-01-12: qty 1

## 2023-01-12 MED ORDER — ACETAMINOPHEN 325 MG PO TABS
650.0000 mg | ORAL_TABLET | Freq: Once | ORAL | Status: AC
  Administered 2023-01-12: 650 mg via ORAL
  Filled 2023-01-12: qty 2

## 2023-01-12 MED ORDER — DENOSUMAB 120 MG/1.7ML ~~LOC~~ SOLN
120.0000 mg | Freq: Once | SUBCUTANEOUS | Status: DC
  Filled 2023-01-12: qty 1.7

## 2023-01-12 MED ORDER — DIPHENHYDRAMINE HCL 25 MG PO CAPS
50.0000 mg | ORAL_CAPSULE | Freq: Once | ORAL | Status: AC
  Administered 2023-01-12: 50 mg via ORAL
  Filled 2023-01-12: qty 2

## 2023-01-12 MED ORDER — DARATUMUMAB-HYALURONIDASE-FIHJ 1800-30000 MG-UT/15ML ~~LOC~~ SOLN
1800.0000 mg | Freq: Once | SUBCUTANEOUS | Status: AC
  Administered 2023-01-12: 1800 mg via SUBCUTANEOUS
  Filled 2023-01-12: qty 15

## 2023-01-12 NOTE — Progress Notes (Signed)
Per Dr. Leonides Schanz - okay to proceed with treatment with elevated BP of 132/102. Patient reports taking BP medication this morning. No headaches, chest pain, or blurred vision reported at this time.    Patient due for [facility] today. Okay to hold in anticipation of dental work.

## 2023-01-12 NOTE — Progress Notes (Addendum)
[facility] Telephone:(336) [phone]   Fax:([phone]  PROGRESS NOTE  Patient Care Team: Georganna Skeans, MD as PCP - General (Family Medicine) Runell Gess, MD as PCP - Cardiology (Cardiology) Jena Gauss Gerrit Friends, MD as Attending Physician (Gastroenterology)  Hematological/Oncological History # IgG Kappa Multiple Myeloma, 1p32/1q21.  01/04/2022: MRI lumbar spine shows diffuse heterogeneous marrow signal with heterogeneous enhancement throughout the thoracolumbar spine.  Additionally, there is subacute-chronic L3 vertebral body compression fraction. 01/14/2022: establish care with Dr. Leonides Schanz  02/10/2022: bone marrow biopsy shows range of plasma cell involvement from 50% on the biopsy to 60% on aspirate smear slides and close to 90% on the clot section for an overall percentage estimated at approximately 80% overall. 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  10/02/2022: Cycle 1 Day 1 of Dara/Rev/Dex 11/03/2022: Cycle 2 Day 1 of Dara/Rev/Dex 11/30/2022:  Cycle 3 Day 1 of Dara/Rev/Dex 12/29/2022: Cycle 4 Day 1 of Dara/Rev/Dex  Interval History:  [name] 52 y.o. male for continued management of IgG kappa multiple myeloma. The patient's last visit was on 12/29/2022. In the interim, he continues on dara/rev/dex. He presents today for Cycle 4, Day 15 of treatment.   On exam today [name] reports developed COVID shortly after visiting [facility] for his transplant evaluation.  He notes that he is having sore throat and congestion.  He notes that he is also not received his Revlimid recently and is concerned about the delay.  He reports that overall he feels like he is recovering well from the COVID infection.  He currently denies any fevers, chills, sweats, nausea, vomiting or diarrhea.  He is not having any side effects as result of his Darzalex shots.  Overall he is willing and able to proceed with treatment at this time.  He has no questions concerns or complaints.  He denies fevers, chills, sweats, shortness of breath, chest pain or cough. He has no other complaints. A full 10 point ROS was otherwise negative.  MEDICAL HISTORY:  Past Medical History:  Diagnosis Date   Allergy    Anemia    Atypical chest pain    Cancer (HCC)    Multiple myeloma with normocytic anemia   GERD (gastroesophageal reflux disease)    Hepatic cyst    Benign by MRI   Hiatal hernia    History of colonic polyps    Hyperlipidemia    Hypertension    Rectal bleeding    Tobacco abuse     SURGICAL HISTORY: Past Surgical History:  Procedure Laterality Date   COLONOSCOPY  01/10/2009     RMR: Normal rectum, normal colon; repeat in 2015 due to FH of colon cancer   COLONOSCOPY N/A 01/09/2014   JYN:[id number] colonic polyps-removed as described   COLONOSCOPY N/A 09/18/2016   Procedure: COLONOSCOPY;  Surgeon: Corbin Ade, MD;  [facility]: [facility];  Service: Endoscopy;  Laterality: N/A;  730    ESOPHAGOGASTRODUODENOSCOPY  10/04/2007   RMR: Distal esophageal erosions consistent with erosive reflux esophagitis, patulous gastroesophageal junction status post passage of a  Maloney dilator, 56 [place].  Otherwise, unremarkable esophagus.  Hiatal hernia.  Otherwise normal stomach.  Bulbar erosion   ESOPHAGOGASTRODUODENOSCOPY N/A 01/09/2014   Erosive reflux esophagitis. Small hiatal hernia   HUMERUS IM NAIL Left 02/22/2022   Procedure: INTRAMEDULLARY (IM)  NAIL HUMERAL;  Surgeon: Yolonda Kida, MD;  Location: [facility] OR;  Service: Orthopedics;  Laterality: Left;   I & D EXTREMITY Left 07/26/2018   Procedure: IRRIGATION AND DEBRIDEMENT EXTREMITY;  Surgeon: Knute Neu, MD;  Location: [facility] OR;  Service: Plastics;  Laterality: Left;   IR BONE TUMOR(S)RF ABLATION  02/05/2022   IR BONE TUMOR(S)RF ABLATION  02/24/2022   IR KYPHO EA ADDL LEVEL THORACIC OR LUMBAR  02/19/2022   IR KYPHO LUMBAR INC FX REDUCE BONE BX UNI/BIL CANNULATION INC/IMAGING  02/05/2022   IR KYPHO LUMBAR INC FX REDUCE BONE BX UNI/BIL CANNULATION INC/IMAGING  02/19/2022   PERCUTANEOUS PINNING Left 07/26/2018   Procedure: PERCUTANEOUS PINNING EXTREMITY;  Surgeon: Knute Neu, MD;  Location: [facility] OR;  Service: Plastics;  Laterality: Left;    SOCIAL HISTORY: Social History   Socioeconomic History   Marital status: Divorced    Spouse name: Not on file   Number of children: 2   Years of education: Not on file   Highest education level: Not on file  Occupational History   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[employer]    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[employer]  Tobacco Use   Smoking status: Former    Current packs/day: 0.00    Average packs/day: 0.3 packs/day for 10.0 years (2.5 ttl pk-yrs)    Types: Cigarettes    Start date: 12/25/2011    Quit date: 12/24/2021    Years since quitting: 1.0   Smokeless tobacco: Never  Vaping Use   Vaping status: Never Used  Substance and Sexual Activity   Alcohol use: Yes    Alcohol/week: 0.0 standard drinks of alcohol    Comment: occasional/wine on the weekends   Drug use: No   Sexual activity: Not on file  Other Topics Concern   Not on file  Social History Narrative   Right handed   Lives alone one story home   Social Determinants of Health   Financial Resource Strain: Not on file  Food Insecurity: No Food Insecurity (07/17/2022)   Hunger Vital Sign    Worried About Running Out of Food in the Last Year: Never true    Ran Out  of Food in the Last Year: Never true  Transportation Needs: No Transportation Needs (07/17/2022)   PRAPARE - Transportation    Lack of Transportation (Medical): No    Lack of Transportation (Non-Medical): No  Physical Activity: Not on file  Stress: Not on file  Social Connections: Not on file  Intimate Partner Violence: Not At Risk (07/17/2022)   Humiliation, Afraid, Rape, and Kick questionnaire    Fear of Current or Ex-Partner: No    Emotionally Abused: No    Physically Abused: No    Sexually Abused: No    FAMILY HISTORY: Family History  Problem Relation Age of Onset   Heart disease Mother    Hypertension Mother    Diabetes Father    Hypertension Father    Colon cancer Sister        passed away from colon cancer, in her 69s   Heart attack Maternal Uncle  Prostate cancer Maternal Uncle    Diabetes Other    Pancreatic cancer Neg Hx    Rectal cancer Neg Hx    Esophageal cancer Neg Hx    Stomach cancer Neg Hx     ALLERGIES:  has No Known Allergies.  MEDICATIONS:  Current Outpatient Medications  Medication Sig Dispense Refill   acyclovir (ZOVIRAX) 400 MG tablet Take 1 tablet by mouth twice daily 60 tablet 0   allopurinol (ZYLOPRIM) 300 MG tablet Take 1 tablet by mouth once daily 30 tablet 0   aspirin EC 81 MG tablet Take 81 mg by mouth daily. Swallow whole.     cetirizine (ZYRTEC) 10 MG tablet Take 10 mg by mouth daily as needed for allergies.     clonazePAM (KLONOPIN) 0.5 MG tablet Take 1 tablet (0.5 mg total) by mouth 2 (two) times daily as needed for anxiety. 15 tablet 0   docusate sodium (COLACE) 100 MG capsule Take 1 capsule (100 mg total) by mouth 2 (two) times daily as needed for mild constipation. 30 capsule 3   DULoxetine (CYMBALTA) 20 MG capsule Take 1 capsule by mouth once daily 90 capsule 1   gabapentin (NEURONTIN) 100 MG capsule Take 1 capsule by mouth twice daily 60 capsule 0   hydrochlorothiazide (HYDRODIURIL) 25 MG tablet TAKE 1 TABLET BY MOUTH ONCE  DAILY(GIVEN ENOUGH REFILL TO LAST UNTIL APPOINTMENT.MUST ATTEND APPT.FOR FURTHER REFILLS.) 38 tablet 0   lisinopril (ZESTRIL) 40 MG tablet Take 1 tablet by mouth once daily 90 tablet 0   Methocarbamol 1000 MG TABS Take 1,000 mg by mouth every 6 (six) hours as needed. (Patient taking differently: Take 1,000 mg by mouth every 6 (six) hours as needed (muscle spasms).) 45 tablet 1   morphine (MS CONTIN) 30 MG 12 hr tablet Take 1 tablet (30 mg total) by mouth every 12 (twelve) hours. 60 tablet 0   nirmatrelvir/ritonavir (PAXLOVID) 20 x 150 MG & 10 x 100MG  TABS Take 3 tablets by mouth 2 (two) times daily for 5 days. (Take nirmatrelvir 150 mg two tablets twice daily for 5 days and ritonavir 100 mg one tablet twice daily for 5 days) Patient GFR is >60 30 tablet 0   ondansetron (ZOFRAN) 8 MG tablet Take 1 tablet (8 mg total) by mouth every 8 (eight) hours as needed for nausea or vomiting. 60 tablet 3   pantoprazole (PROTONIX) 40 MG tablet Take 1 tablet by mouth twice daily 60 tablet 0   polyethylene glycol powder (GLYCOLAX/MIRALAX) 17 GM/SCOOP powder Take 1 capful (17 g) with water by mouth daily. (Patient taking differently: Take 17 g by mouth daily as needed for mild constipation.) 238 g 0   prochlorperazine (COMPAZINE) 10 MG tablet Take 1 tablet (10 mg total) by mouth every 6 (six) hours as needed for nausea or vomiting. 60 tablet 0   REVLIMID 25 MG capsule Take 1 capsule (25 mg total) by mouth daily. Take one capsule daily x 14 days. None for following 7 days Auth # [id number] Date obtained  12/10/22 14 capsule 0   rosuvastatin (CRESTOR) 10 MG tablet Take 1 tablet (10 mg total) by mouth daily. 90 tablet 1   sildenafil (VIAGRA) 50 MG tablet Take 1 tablet (50 mg total) by mouth daily as needed for erectile dysfunction. 10 tablet 0   No current facility-administered medications for this visit.   Facility-Administered Medications Ordered in Other Visits  Medication Dose Route Frequency Provider Last Rate Last  Admin   denosumab (XGEVA) injection 120 mg  120 mg Subcutaneous Once Jaci Standard, MD        REVIEW OF SYSTEMS:   Constitutional: ( - ) fevers, ( - )  chills , ( - ) night sweats Eyes: ( - ) blurriness of vision, ( - ) double vision, ( - ) watery eyes Ears, nose, mouth, throat, and face: ( - ) mucositis, ( - ) sore throat Respiratory: ( - ) cough, ( - ) dyspnea, ( - ) wheezes Cardiovascular: ( - ) palpitation, ( - ) chest discomfort, ( - ) lower extremity swelling Gastrointestinal:  ( - ) nausea, ( +) heartburn, ( - ) change in bowel habits Skin: ( - ) abnormal skin rashes Lymphatics: ( - ) new lymphadenopathy, ( - ) easy bruising Neurological: ( - ) numbness, ( - ) tingling, ( - ) new weaknesses Behavioral/Psych: ( - ) mood change, ( - ) new changes  All other systems were reviewed with the patient and are negative.  PHYSICAL EXAMINATION: ECOG PERFORMANCE STATUS: 1 - Symptomatic but completely ambulatory  There were no vitals filed for this visit. There were no vitals filed for this visit.   GENERAL: Well-appearing young African-American male, alert, no distress and comfortable SKIN: skin color, texture, turgor are normal, no rashes or significant lesions EYES: conjunctiva are pink and non-injected, sclera clear. LUNGS: clear to auscultation and percussion with normal breathing effort HEART: regular rate & rhythm and no murmurs and no lower extremity edema Musculoskeletal: no cyanosis of digits and no clubbing  PSYCH: alert & oriented x 3, fluent speech NEURO: no focal motor/sensory deficits  LABORATORY DATA:  I have reviewed the data as listed    Latest Ref Rng & Units 01/12/2023   12:01 PM 12/29/2022   10:28 AM 12/16/2022    1:07 PM  CBC  WBC 4.0 - 10.5 K/uL 7.0  6.6  4.1   Hemoglobin 13.0 - 17.0 g/dL 96.2  95.2  84.1   Hematocrit 39.0 - 52.0 % 43.5  38.1  38.5   Platelets 150 - 400 K/uL 295  208  223        Latest Ref Rng & Units 01/12/2023   12:01 PM 12/29/2022    10:28 AM 12/16/2022    1:07 PM  CMP  Glucose 70 - 99 mg/dL 324  401  027   BUN 6 - 20 mg/dL 12  9  7    Creatinine 0.61 - 1.24 mg/dL 2.53  6.64  4.03   Sodium 135 - 145 mmol/L 136  133  136   Potassium 3.5 - 5.1 mmol/L 4.2  4.2  3.9   Chloride 98 - 111 mmol/L 102  101  101   CO2 22 - 32 mmol/L 25  25  29    Calcium 8.9 - 10.3 mg/dL 47.4  9.0  9.5   Total Protein 6.5 - 8.1 g/dL 8.0  7.0  6.6   Total Bilirubin 0.3 - 1.2 mg/dL 0.4  0.5  0.7   Alkaline Phos 38 - 126 U/L 63  64  69   AST 15 - 41 U/L 19  21  19    ALT 0 - 44 U/L 22  25  23      Lab Results  Component Value Date   MPROTEIN 0.2 (H) 12/29/2022   MPROTEIN 0.2 (H) 12/01/2022   MPROTEIN 0.2 (H) 11/03/2022   Lab Results  Component Value Date   KPAFRELGTCHN 11.2 12/29/2022   KPAFRELGTCHN 9.9 12/01/2022   KPAFRELGTCHN 11.2 11/03/2022  LAMBDASER 7.3 12/29/2022   LAMBDASER 8.8 12/01/2022   LAMBDASER 8.5 11/03/2022   KAPLAMBRATIO 1.53 12/29/2022   KAPLAMBRATIO 1.13 12/01/2022   KAPLAMBRATIO 1.32 11/03/2022   RADIOGRAPHIC STUDIES: No results found.  ASSESSMENT & PLAN [name] 52 y.o. male with medical history significant for IgG kappa multiple myeloma who presents for a follow up visit.   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 -- Recommend VRD chemotherapy with intention of proceeding to transplant. -- Labs at each visit to include CBC, CMP, LDH with monthly restaging labs SPEP and serum free light chains -- Started VRD therapy on 02/26/2022. Held Velcade therapy on 09/24/2022 due to development of ocular toxicity. --Switched to Dara/Rev/Dex on 10/02/2022 PLAN: --Due for Cycle 4, Day 15 of Dara/Rev/Dex.  --Labs today reviewed and adequate for treatment. WBC 7.0, hemoglobin 14.4, MCV 87.2, and platelets of 295 --Most recent myeloma labs from 12/28/2021 detected M protein at  0.2 g/dL. Kappa light chain 11.2, Lambda light chain 7.3, Ratio 1.53 --Proceed with treatment today without any dose modifications.  -- Patient has made contact with the bone marrow transplant unit of [facility].  Currently undergoing evaluation there.  Holding Xgeva per their request. --RTC for weekly treatments with q 2 week toxicity checks.   #Erectile Dysfunction -- Likely secondary to chemotherapy. --prescribed Viagra 50 mg p.o. as needed x 10 pills. -- If ineffective could consider Cialis.  # Leg Discomfort/Restless Leg -- Patient provided with Requip by the emergency department, discontinued due to poor efficacy. --Evaluated by Dr. Barbaraann Cao on 07/28/2022. Recommended to short course of steroids so started prednisone 50 mg daily x 7 days. --Discussed etiologies including iron deficiency so iron levels were checked and was normal.   #Constipation--improving: --Recommend to continue on colace but add miralax as well.   #Pathologic fractures-secondary to MM: --Involving L1, L2 and L3 compression fracture and left humerus fracture.  --Underwent kyphoplasty of L3 fracture on 02/19/2022 --Underwent medullary nailing of left humeral shaft on 02/22/2022 --Underwent kyphoplasty on 06/04/2022.   #Pain Medication -- Per palliative care patient has weaned off MS contin. Currently conitnues on gabapentin 100 mg BID, cymbalta 20 mg and robaxin as needed.  --Under the care of palliative care team.   #Supportive Care -- chemotherapy education complete -- port placement not required -- zofran 8mg  q8H PRN and compazine 10mg  PO q6H for nausea -- acyclovir 400mg  PO BID for VCZ prophylaxis -- allopurinol 300mg  PO daily for TLS prophylaxis -- Dental clearance for Zometa/Denosumab approved.  HOLDING per [facility] transplant request.   No orders of the defined types were placed in this encounter.   All questions were answered. The patient knows to call the clinic with any problems,  questions or concerns.  I have spent a total of 30 minutes minutes of face-to-face and non-face-to-face time, preparing to see the patient,performing a medically appropriate examination, counseling and educating the patient, documenting clinical information in the electronic health record,  and care coordination.  Ulysees Barns, MD Department of Hematology/Oncology [facility] Phone: [phone] Pager: [phone] Email: [email or web address]

## 2023-01-12 NOTE — Progress Notes (Signed)
Palliative Medicine [facility]  Telephone:([phone] Fax:(336) [phone]   Name: [name] Date: 01/12/2023 MRN: [id number]  DOB: May 07, 1971  Patient Care Team: Georganna Skeans, MD as PCP - General (Family Medicine) Runell Gess, MD as PCP - Cardiology (Cardiology) Jena Gauss Gerrit Friends, MD as Attending Physician (Gastroenterology)   I connected with [name] on 01/12/23 at  2:30 PM EDT by and verified that I am speaking with the correct person using two identifiers.   I discussed the limitations, risks, security and privacy concerns of performing an evaluation and management service by telemedicine and the availability of in-person appointments. I also discussed with the patient that there may be a patient responsible charge related to this service. The patient expressed understanding and agreed to proceed.   Patient's location: Home  Provider's location: [facility]   INTERVAL HISTORY: [name] is a 52 y.o. male with oncologic medical history including IgG Kappa Multiple Myeloma, L1, L2, and L3 vertebral compression fraction s/p kyphoplasty(9/27) and IM nailing of left humeral shaft (9/30), currently undergoing VRD therapy.  Palliative ask to see for symptom management.   SOCIAL HISTORY:     reports that he quit smoking about 12 months ago. His smoking use included cigarettes. He started smoking about 11 years ago. He has a 2.5 pack-year smoking history. He has never used smokeless tobacco. He reports current alcohol use. He reports that he does not use drugs.  ADVANCE DIRECTIVES:    CODE STATUS:   PAST MEDICAL HISTORY: Past Medical History:  Diagnosis Date   Allergy    Anemia    Atypical chest pain    Cancer (HCC)    Multiple myeloma with normocytic anemia   GERD (gastroesophageal reflux disease)    Hepatic cyst    Benign by MRI   Hiatal hernia    History of colonic polyps    Hyperlipidemia    Hypertension    Rectal bleeding     Tobacco abuse     ALLERGIES:  has No Known Allergies.  MEDICATIONS:  Current Outpatient Medications  Medication Sig Dispense Refill   acyclovir (ZOVIRAX) 400 MG tablet Take 1 tablet by mouth twice daily 60 tablet 0   allopurinol (ZYLOPRIM) 300 MG tablet Take 1 tablet by mouth once daily 30 tablet 0   aspirin EC 81 MG tablet Take 81 mg by mouth daily. Swallow whole.     cetirizine (ZYRTEC) 10 MG tablet Take 10 mg by mouth daily as needed for allergies.     clonazePAM (KLONOPIN) 0.5 MG tablet Take 1 tablet (0.5 mg total) by mouth 2 (two) times daily as needed for anxiety. 15 tablet 0   docusate sodium (COLACE) 100 MG capsule Take 1 capsule (100 mg total) by mouth 2 (two) times daily as needed for mild constipation. 30 capsule 3   DULoxetine (CYMBALTA) 20 MG capsule Take 1 capsule by mouth once daily 90 capsule 1   gabapentin (NEURONTIN) 100 MG capsule Take 1 capsule by mouth twice daily 60 capsule 0   hydrochlorothiazide (HYDRODIURIL) 25 MG tablet TAKE 1 TABLET BY MOUTH ONCE DAILY(GIVEN ENOUGH REFILL TO LAST UNTIL APPOINTMENT.MUST ATTEND APPT.FOR FURTHER REFILLS.) 38 tablet 0   lisinopril (ZESTRIL) 40 MG tablet Take 1 tablet by mouth once daily 90 tablet 0   Methocarbamol 1000 MG TABS Take 1,000 mg by mouth every 6 (six) hours as needed. (Patient taking differently: Take 1,000 mg by mouth every 6 (six) hours as needed (muscle spasms).)  45 tablet 1   morphine (MS CONTIN) 30 MG 12 hr tablet Take 1 tablet (30 mg total) by mouth every 12 (twelve) hours. 60 tablet 0   nirmatrelvir/ritonavir (PAXLOVID) 20 x 150 MG & 10 x 100MG  TABS Take 3 tablets by mouth 2 (two) times daily for 5 days. (Take nirmatrelvir 150 mg two tablets twice daily for 5 days and ritonavir 100 mg one tablet twice daily for 5 days) Patient GFR is >60 30 tablet 0   ondansetron (ZOFRAN) 8 MG tablet Take 1 tablet (8 mg total) by mouth every 8 (eight) hours as needed for nausea or vomiting. 60 tablet 3   pantoprazole (PROTONIX) 40 MG  tablet Take 1 tablet by mouth twice daily 60 tablet 0   polyethylene glycol powder (GLYCOLAX/MIRALAX) 17 GM/SCOOP powder Take 1 capful (17 g) with water by mouth daily. (Patient taking differently: Take 17 g by mouth daily as needed for mild constipation.) 238 g 0   prochlorperazine (COMPAZINE) 10 MG tablet Take 1 tablet (10 mg total) by mouth every 6 (six) hours as needed for nausea or vomiting. 60 tablet 0   REVLIMID 25 MG capsule Take 1 capsule (25 mg total) by mouth daily. Take one capsule daily x 14 days. None for following 7 days Auth # [id number] Date obtained  12/10/22 14 capsule 0   rosuvastatin (CRESTOR) 10 MG tablet Take 1 tablet (10 mg total) by mouth daily. 90 tablet 1   sildenafil (VIAGRA) 50 MG tablet Take 1 tablet (50 mg total) by mouth daily as needed for erectile dysfunction. 10 tablet 0   No current facility-administered medications for this visit.    VITAL SIGNS: There were no vitals taken for this visit. There were no vitals filed for this visit.  Estimated body mass index is 35.37 kg/m as calculated from the following:   Height as of an earlier encounter on 01/12/23: 5\' 9"  (1.753 m).   Weight as of an earlier encounter on 01/12/23: 239 lb 8 oz (108.6 kg).   PERFORMANCE STATUS (ECOG) : 1 - Symptomatic but completely ambulatory  Assessment NAD, ambulatory RRR Normal breathing pattern AAO x4  IMPRESSION:  I connected with Mr. Aldi via phone. No acute distress identified. He denies nausea, vomiting, constipation, or diarrhea. Remaining active. Appetite is good. Overall doing well.    Neoplasm related pain Tyshon reports pain continues to be well-controlled.    MS Contin 30 mg twice daily.  Continues with Gabapentin 100 mg twice daily, Cymbalta 20 mg, and robaxin as needed. Given controlled pain no changes at this time.  Will continue to closely monitor.  Nausea/Indigestion Resolved. Appetite is doing good.   Constipation Resolved with current regimen  Overall  [name] is much improved.  Appreciative of where he is in his health journey.  He knows to contact our office as needed with any concerns.  PLAN:  MS Contin 30 mg daily Gabapentin 100 mg twice daily Cymbalta 20 mg daily  Zofran and Compazine as needed for nausea Robaxin as needed for muscle spasms Miralax daily for constipation I will plan to see patient back in 4-6 weeks in collaboration with other oncology appointments.  Patient knows to contact office sooner if needed.   Patient expressed understanding and was in agreement with this plan. He also understands that He can call the clinic at any time with any questions, concerns, or complaints.    Any controlled substances utilized were prescribed in the context of palliative care. PDMP has been reviewed.  Visit consisted of counseling and education dealing with the complex and emotionally intense issues of symptom management and palliative care in the setting of serious and potentially life-threatening illness.Greater than 50%  of this time was spent counseling and coordinating care related to the above assessment and plan.  Willette Alma, AGPCNP-BC  Palliative Medicine Team/[facility]  *Please note that this is a verbal dictation therefore any spelling or grammatical errors are due to the "Dragon Medical One" system interpretation.

## 2023-01-12 NOTE — Patient Instructions (Signed)
[facility]   Discharge Instructions: Thank you for choosing [facility] to provide your oncology and hematology care.   If you have a lab appointment with the Cancer Center, please go directly to the Cancer Center and check in at the registration area.   Wear comfortable clothing and clothing appropriate for easy access to any Portacath or PICC line.   We strive to give you quality time with your provider. You may need to reschedule your appointment if you arrive late (15 or more minutes).  Arriving late affects you and other patients whose appointments are after yours.  Also, if you miss three or more appointments without notifying the office, you may be dismissed from the clinic at the provider's discretion.      For prescription refill requests, have your pharmacy contact our office and allow 72 hours for refills to be completed.    Today you received the following chemotherapy and/or immunotherapy agents: Daratumumab hyaluronidase (Darzalex faspro)       To help prevent nausea and vomiting after your treatment, we encourage you to take your nausea medication as directed.  BELOW ARE SYMPTOMS THAT SHOULD BE REPORTED IMMEDIATELY: *FEVER GREATER THAN 100.4 F (38 C) OR HIGHER *CHILLS OR SWEATING *NAUSEA AND VOMITING THAT IS NOT CONTROLLED WITH YOUR NAUSEA MEDICATION *UNUSUAL SHORTNESS OF BREATH *UNUSUAL BRUISING OR BLEEDING *URINARY PROBLEMS (pain or burning when urinating, or frequent urination) *BOWEL PROBLEMS (unusual diarrhea, constipation, pain near the anus) TENDERNESS IN MOUTH AND THROAT WITH OR WITHOUT PRESENCE OF ULCERS (sore throat, sores in mouth, or a toothache) UNUSUAL RASH, SWELLING OR PAIN  UNUSUAL VAGINAL DISCHARGE OR ITCHING   Items with * indicate a potential emergency and should be followed up as soon as possible or go to the Emergency Department if any problems should occur.  Please show the CHEMOTHERAPY ALERT CARD  or IMMUNOTHERAPY ALERT CARD at check-in to the Emergency Department and triage nurse.  Should you have questions after your visit or need to cancel or reschedule your appointment, please contact [facility]  Dept: [phone]  and follow the prompts.  Office hours are 8:00 a.m. to 4:30 p.m. Monday - Friday. Please note that voicemails left after 4:00 p.m. may not be returned until the following business day.  We are closed weekends and major holidays. You have access to a nurse at all times for urgent questions. Please call the main number to the clinic Dept: [phone] and follow the prompts.   For any non-urgent questions, you may also contact your provider using MyChart. We now offer e-Visits for anyone 18 and older to request care online for non-urgent symptoms. For details visit [email or web address].   Also download the MyChart app! Go to the app store, search "MyChart", open the app, select [facility], and log in with your MyChart username and password.  

## 2023-01-14 ENCOUNTER — Other Ambulatory Visit: Payer: Self-pay | Admitting: *Deleted

## 2023-01-14 DIAGNOSIS — C9000 Multiple myeloma not having achieved remission: Secondary | ICD-10-CM

## 2023-01-14 MED ORDER — REVLIMID 25 MG PO CAPS
25.0000 mg | ORAL_CAPSULE | Freq: Every day | ORAL | 0 refills | Status: DC
Start: 2023-01-14 — End: 2023-01-15

## 2023-01-15 ENCOUNTER — Other Ambulatory Visit: Payer: Self-pay | Admitting: *Deleted

## 2023-01-15 DIAGNOSIS — C9000 Multiple myeloma not having achieved remission: Secondary | ICD-10-CM

## 2023-01-15 MED ORDER — REVLIMID 25 MG PO CAPS: 25.0000 mg | ORAL_CAPSULE | Freq: Every day | ORAL | 0 refills | Status: DC

## 2023-01-16 ENCOUNTER — Other Ambulatory Visit: Payer: Self-pay | Admitting: Physician Assistant

## 2023-01-20 ENCOUNTER — Telehealth: Payer: Self-pay | Admitting: Hematology and Oncology

## 2023-01-27 ENCOUNTER — Inpatient Hospital Stay: Payer: BLUE CROSS/BLUE SHIELD

## 2023-01-27 ENCOUNTER — Inpatient Hospital Stay: Payer: BLUE CROSS/BLUE SHIELD | Admitting: Physician Assistant

## 2023-01-27 ENCOUNTER — Encounter: Payer: Self-pay | Admitting: *Deleted

## 2023-01-28 ENCOUNTER — Inpatient Hospital Stay: Payer: BLUE CROSS/BLUE SHIELD | Attending: Physician Assistant | Admitting: Hematology and Oncology

## 2023-01-28 ENCOUNTER — Inpatient Hospital Stay: Payer: BLUE CROSS/BLUE SHIELD

## 2023-01-28 VITALS — BP 148/80 | HR 98 | Temp 98.0°F | Resp 16 | Wt 245.5 lb

## 2023-01-28 DIAGNOSIS — Z5112 Encounter for antineoplastic immunotherapy: Secondary | ICD-10-CM | POA: Insufficient documentation

## 2023-01-28 DIAGNOSIS — M4856XA Collapsed vertebra, not elsewhere classified, lumbar region, initial encounter for fracture: Secondary | ICD-10-CM | POA: Diagnosis not present

## 2023-01-28 DIAGNOSIS — K5900 Constipation, unspecified: Secondary | ICD-10-CM | POA: Diagnosis not present

## 2023-01-28 DIAGNOSIS — I10 Essential (primary) hypertension: Secondary | ICD-10-CM | POA: Insufficient documentation

## 2023-01-28 DIAGNOSIS — E785 Hyperlipidemia, unspecified: Secondary | ICD-10-CM | POA: Insufficient documentation

## 2023-01-28 DIAGNOSIS — Z7189 Other specified counseling: Secondary | ICD-10-CM

## 2023-01-28 DIAGNOSIS — Z8719 Personal history of other diseases of the digestive system: Secondary | ICD-10-CM | POA: Insufficient documentation

## 2023-01-28 DIAGNOSIS — C9000 Multiple myeloma not having achieved remission: Secondary | ICD-10-CM

## 2023-01-28 DIAGNOSIS — K219 Gastro-esophageal reflux disease without esophagitis: Secondary | ICD-10-CM | POA: Diagnosis not present

## 2023-01-28 DIAGNOSIS — Z7982 Long term (current) use of aspirin: Secondary | ICD-10-CM | POA: Insufficient documentation

## 2023-01-28 DIAGNOSIS — Z7961 Long term (current) use of immunomodulator: Secondary | ICD-10-CM | POA: Diagnosis not present

## 2023-01-28 DIAGNOSIS — Z800 Family history of malignant neoplasm of digestive organs: Secondary | ICD-10-CM | POA: Insufficient documentation

## 2023-01-28 DIAGNOSIS — Z87891 Personal history of nicotine dependence: Secondary | ICD-10-CM | POA: Diagnosis not present

## 2023-01-28 DIAGNOSIS — N529 Male erectile dysfunction, unspecified: Secondary | ICD-10-CM | POA: Diagnosis not present

## 2023-01-28 DIAGNOSIS — G2581 Restless legs syndrome: Secondary | ICD-10-CM | POA: Insufficient documentation

## 2023-01-28 LAB — CBC WITH DIFFERENTIAL (CANCER CENTER ONLY)
Abs Immature Granulocytes: 0.04 10*3/uL (ref 0.00–0.07)
Basophils Absolute: 0 10*3/uL (ref 0.0–0.1)
Basophils Relative: 0 %
Eosinophils Absolute: 0.1 10*3/uL (ref 0.0–0.5)
Eosinophils Relative: 2 %
HCT: 37.7 % — ABNORMAL LOW (ref 39.0–52.0)
Hemoglobin: 12.4 g/dL — ABNORMAL LOW (ref 13.0–17.0)
Immature Granulocytes: 1 %
Lymphocytes Relative: 22 %
Lymphs Abs: 1.4 10*3/uL (ref 0.7–4.0)
MCH: 28.8 pg (ref 26.0–34.0)
MCHC: 32.9 g/dL (ref 30.0–36.0)
MCV: 87.5 fL (ref 80.0–100.0)
Monocytes Absolute: 0.4 10*3/uL (ref 0.1–1.0)
Monocytes Relative: 6 %
Neutro Abs: 4.3 10*3/uL (ref 1.7–7.7)
Neutrophils Relative %: 69 %
Platelet Count: 171 10*3/uL (ref 150–400)
RBC: 4.31 MIL/uL (ref 4.22–5.81)
RDW: 13.2 % (ref 11.5–15.5)
WBC Count: 6.1 10*3/uL (ref 4.0–10.5)
nRBC: 0 % (ref 0.0–0.2)

## 2023-01-28 LAB — CMP (CANCER CENTER ONLY)
ALT: 22 U/L (ref 0–44)
AST: 18 U/L (ref 15–41)
Albumin: 4.2 g/dL (ref 3.5–5.0)
Alkaline Phosphatase: 59 U/L (ref 38–126)
Anion gap: 8 (ref 5–15)
BUN: 10 mg/dL (ref 6–20)
CO2: 26 mmol/L (ref 22–32)
Calcium: 8.9 mg/dL (ref 8.9–10.3)
Chloride: 101 mmol/L (ref 98–111)
Creatinine: 0.93 mg/dL (ref 0.61–1.24)
GFR, Estimated: >60 mL/min (ref >60)
Glucose, Bld: 141 mg/dL — ABNORMAL HIGH (ref 70–99)
Potassium: 3.6 mmol/L (ref 3.5–5.1)
Sodium: 135 mmol/L (ref 135–145)
Total Bilirubin: 0.5 mg/dL (ref 0.3–1.2)
Total Protein: 6.7 g/dL (ref 6.5–8.1)

## 2023-01-28 MED ORDER — FAMOTIDINE 20 MG PO TABS
20.0000 mg | ORAL_TABLET | Freq: Once | ORAL | Status: AC
  Administered 2023-01-28: 20 mg via ORAL
  Filled 2023-01-28: qty 1

## 2023-01-28 MED ORDER — ACETAMINOPHEN 325 MG PO TABS
650.0000 mg | ORAL_TABLET | Freq: Once | ORAL | Status: AC
  Administered 2023-01-28: 650 mg via ORAL
  Filled 2023-01-28: qty 2

## 2023-01-28 MED ORDER — DARATUMUMAB-HYALURONIDASE-FIHJ 1800-30000 MG-UT/15ML ~~LOC~~ SOLN
1800.0000 mg | Freq: Once | SUBCUTANEOUS | Status: AC
  Administered 2023-01-28: 1800 mg via SUBCUTANEOUS
  Filled 2023-01-28: qty 15

## 2023-01-28 MED ORDER — DIPHENHYDRAMINE HCL 25 MG PO CAPS
50.0000 mg | ORAL_CAPSULE | Freq: Once | ORAL | Status: AC
  Administered 2023-01-28: 50 mg via ORAL
  Filled 2023-01-28: qty 2

## 2023-01-28 MED ORDER — DEXAMETHASONE 4 MG PO TABS
40.0000 mg | ORAL_TABLET | Freq: Once | ORAL | Status: AC
  Administered 2023-01-28: 40 mg via ORAL
  Filled 2023-01-28: qty 10

## 2023-01-28 NOTE — Patient Instructions (Signed)
[facility]   Discharge Instructions: Thank you for choosing [facility] to provide your oncology and hematology care.   If you have a lab appointment with the Cancer Center, please go directly to the Cancer Center and check in at the registration area.   Wear comfortable clothing and clothing appropriate for easy access to any Portacath or PICC line.   We strive to give you quality time with your provider. You may need to reschedule your appointment if you arrive late (15 or more minutes).  Arriving late affects you and other patients whose appointments are after yours.  Also, if you miss three or more appointments without notifying the office, you may be dismissed from the clinic at the provider's discretion.      For prescription refill requests, have your pharmacy contact our office and allow 72 hours for refills to be completed.    Today you received the following chemotherapy and/or immunotherapy agents: Daratumumab hyaluronidase (Darzalex faspro)       To help prevent nausea and vomiting after your treatment, we encourage you to take your nausea medication as directed.  BELOW ARE SYMPTOMS THAT SHOULD BE REPORTED IMMEDIATELY: *FEVER GREATER THAN 100.4 F (38 C) OR HIGHER *CHILLS OR SWEATING *NAUSEA AND VOMITING THAT IS NOT CONTROLLED WITH YOUR NAUSEA MEDICATION *UNUSUAL SHORTNESS OF BREATH *UNUSUAL BRUISING OR BLEEDING *URINARY PROBLEMS (pain or burning when urinating, or frequent urination) *BOWEL PROBLEMS (unusual diarrhea, constipation, pain near the anus) TENDERNESS IN MOUTH AND THROAT WITH OR WITHOUT PRESENCE OF ULCERS (sore throat, sores in mouth, or a toothache) UNUSUAL RASH, SWELLING OR PAIN  UNUSUAL VAGINAL DISCHARGE OR ITCHING   Items with * indicate a potential emergency and should be followed up as soon as possible or go to the Emergency Department if any problems should occur.  Please show the CHEMOTHERAPY ALERT CARD  or IMMUNOTHERAPY ALERT CARD at check-in to the Emergency Department and triage nurse.  Should you have questions after your visit or need to cancel or reschedule your appointment, please contact [facility]  Dept: [phone]  and follow the prompts.  Office hours are 8:00 a.m. to 4:30 p.m. Monday - Friday. Please note that voicemails left after 4:00 p.m. may not be returned until the following business day.  We are closed weekends and major holidays. You have access to a nurse at all times for urgent questions. Please call the main number to the clinic Dept: [phone] and follow the prompts.   For any non-urgent questions, you may also contact your provider using MyChart. We now offer e-Visits for anyone 18 and older to request care online for non-urgent symptoms. For details visit [email or web address].   Also download the MyChart app! Go to the app store, search "MyChart", open the app, select [facility], and log in with your MyChart username and password.  

## 2023-01-29 ENCOUNTER — Other Ambulatory Visit: Payer: Self-pay | Admitting: Nurse Practitioner

## 2023-01-29 ENCOUNTER — Other Ambulatory Visit: Payer: Self-pay | Admitting: Hematology and Oncology

## 2023-01-29 ENCOUNTER — Other Ambulatory Visit: Payer: Self-pay | Admitting: Physician Assistant

## 2023-01-29 DIAGNOSIS — C9000 Multiple myeloma not having achieved remission: Secondary | ICD-10-CM

## 2023-01-29 DIAGNOSIS — G893 Neoplasm related pain (acute) (chronic): Secondary | ICD-10-CM

## 2023-01-29 DIAGNOSIS — Z515 Encounter for palliative care: Secondary | ICD-10-CM

## 2023-01-29 DIAGNOSIS — K30 Functional dyspepsia: Secondary | ICD-10-CM

## 2023-01-29 LAB — KAPPA/LAMBDA LIGHT CHAINS
Kappa free light chain: 9.8 mg/L (ref 3.3–19.4)
Kappa, lambda light chain ratio: 1.38 (ref 0.26–1.65)
Lambda free light chains: 7.1 mg/L (ref 5.7–26.3)

## 2023-01-29 MED ORDER — MORPHINE SULFATE ER 30 MG PO TBCR: 30.0000 mg | EXTENDED_RELEASE_TABLET | Freq: Two times a day (BID) | ORAL | 0 refills | Status: DC

## 2023-01-29 MED ORDER — REVLIMID 25 MG PO CAPS: 25.0000 mg | ORAL_CAPSULE | Freq: Every day | ORAL | 0 refills | Status: DC

## 2023-01-29 MED ORDER — SILDENAFIL CITRATE 50 MG PO TABS: 50.0000 mg | ORAL_TABLET | Freq: Every day | ORAL | 0 refills | Status: DC | PRN

## 2023-01-30 ENCOUNTER — Telehealth: Payer: Self-pay | Admitting: Family Medicine

## 2023-01-30 ENCOUNTER — Other Ambulatory Visit: Payer: Self-pay | Admitting: Nurse Practitioner

## 2023-01-30 DIAGNOSIS — C9000 Multiple myeloma not having achieved remission: Secondary | ICD-10-CM

## 2023-01-30 DIAGNOSIS — Z515 Encounter for palliative care: Secondary | ICD-10-CM

## 2023-01-30 DIAGNOSIS — K30 Functional dyspepsia: Secondary | ICD-10-CM

## 2023-01-30 MED ORDER — PANTOPRAZOLE SODIUM 40 MG PO TBEC
40.0000 mg | DELAYED_RELEASE_TABLET | Freq: Two times a day (BID) | ORAL | 0 refills | Status: AC
Start: 2023-01-30 — End: ?

## 2023-01-30 NOTE — Telephone Encounter (Signed)
Patient dropped off document  [employer] Disability Form , to be filled out by provider. Patient requested to send it back via Fax within 5-days. Document is located in providers tray at front office.Please advise at  Fax [phone] , Email: [email or web address],

## 2023-01-30 NOTE — Telephone Encounter (Signed)
Please schedule patient an appt for form fill out.

## 2023-01-31 ENCOUNTER — Encounter: Payer: Self-pay | Admitting: Hematology

## 2023-01-31 ENCOUNTER — Encounter: Payer: Self-pay | Admitting: Hematology and Oncology

## 2023-01-31 NOTE — Progress Notes (Signed)
[facility] [phone]   [phone]  PROGRESS NOTE  Patient Care Team: Georganna Skeans, MD as PCP - General (Family Medicine) Runell Gess, MD as PCP - Cardiology (Cardiology) Jena Gauss Gerrit Friends, MD as Attending Physician (Gastroenterology)  Hematological/Oncological History # IgG Kappa Multiple Myeloma, 1p32/1q21.  01/04/2022: MRI lumbar spine shows diffuse heterogeneous marrow signal with heterogeneous enhancement throughout the thoracolumbar spine.  Additionally, there is subacute-chronic L3 vertebral body compression fraction. 01/14/2022: establish care with Dr. Leonides Schanz  02/10/2022: bone marrow biopsy shows range of plasma cell involvement from 50% on the biopsy to 60% on aspirate smear slides and close to 90% on the clot section for an overall percentage estimated at approximately 80% overall. 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  10/02/2022: Cycle 1 Day 1 of Dara/Rev/Dex 11/03/2022: Cycle 2 Day 1 of Dara/Rev/Dex 11/30/2022:  Cycle 3 Day 1 of Dara/Rev/Dex 12/29/2022: Cycle 4 Day 1 of Dara/Rev/Dex  Interval History:  [name] 52 y.o. male for continued management of IgG kappa multiple myeloma. The patient's last visit was on 12/29/2022. In the interim, he continues on dara/rev/dex. He presents today for Cycle 5, Day 1 of treatment.   On exam today [name] reports he has been well overall in the interim since her last visit.  He  reports that in October 2024 he will be undergoing teeth extraction and afterwards will be proceeding with the transplant workup and treatment.  He notes his energy has been up and down his appetite has been good.  He is not having any major side effects as result of the Darzalex Revlimid and Dex therapy.  He reports he does have some occasional itching at the injection site but otherwise no major side effects.  He reports his pain is under excellent control and currently rates it about a 1 out of 10.  He denies fevers, chills, sweats, shortness of breath, chest pain or cough. He has no other complaints. A full 10 point ROS was otherwise negative.  MEDICAL HISTORY:  Past Medical History:  Diagnosis Date   Allergy    Anemia    Atypical chest pain    Cancer (HCC)    Multiple myeloma with normocytic anemia   GERD (gastroesophageal reflux disease)    Hepatic cyst    Benign by MRI   Hiatal hernia    History of colonic polyps    Hyperlipidemia    Hypertension    Rectal bleeding    Tobacco abuse     SURGICAL HISTORY: Past Surgical History:  Procedure Laterality Date   COLONOSCOPY  01/10/2009     RMR: Normal rectum, normal colon; repeat in 2015 due to FH of colon cancer   COLONOSCOPY N/A 01/09/2014   [id number] colonic polyps-removed as described   COLONOSCOPY N/A 09/18/2016   Procedure: COLONOSCOPY;  Surgeon: Corbin Ade, MD;  [facility]: [facility];  Service: Endoscopy;  Laterality: N/A;  730    ESOPHAGOGASTRODUODENOSCOPY  10/04/2007   RMR: Distal esophageal erosions consistent with erosive reflux esophagitis, patulous gastroesophageal junction status post passage of a  Maloney dilator, 56 [place].  Otherwise, unremarkable esophagus.  Hiatal hernia.  Otherwise normal stomach.  Bulbar erosion   ESOPHAGOGASTRODUODENOSCOPY N/A 01/09/2014   Erosive reflux esophagitis. Small hiatal hernia   HUMERUS IM NAIL Left 02/22/2022   Procedure: INTRAMEDULLARY (IM) NAIL HUMERAL;  Surgeon: Yolonda Kida, MD;  Location: [facility] OR;  Service: Orthopedics;  Laterality: Left;   I & D EXTREMITY Left 07/26/2018   Procedure: IRRIGATION AND DEBRIDEMENT EXTREMITY;  Surgeon: Knute Neu, MD;  Location: [facility] OR;  Service: Plastics;  Laterality: Left;   IR BONE TUMOR(S)RF ABLATION  02/05/2022   IR BONE TUMOR(S)RF ABLATION  02/24/2022   IR KYPHO EA ADDL LEVEL THORACIC OR LUMBAR  02/19/2022   IR KYPHO LUMBAR INC FX REDUCE BONE BX UNI/BIL CANNULATION INC/IMAGING  02/05/2022   IR KYPHO LUMBAR INC FX REDUCE BONE BX UNI/BIL CANNULATION INC/IMAGING  02/19/2022   PERCUTANEOUS PINNING Left 07/26/2018   Procedure: PERCUTANEOUS PINNING EXTREMITY;  Surgeon: Knute Neu, MD;  Location: [facility] OR;  Service: Plastics;  Laterality: Left;    SOCIAL HISTORY: Social History   Socioeconomic History   Marital status: Divorced    Spouse name: Not on file   Number of children: 2   Years of education: Not on file   Highest education level: Not on file  Occupational History   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[employer]    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[employer]  Tobacco Use   Smoking status: Former    Current packs/day: 0.00    Average packs/day: 0.3 packs/day for 10.0 years (2.5 ttl pk-yrs)    Types: Cigarettes    Start date: 12/25/2011    Quit date: 12/24/2021    Years since quitting: 1.1   Smokeless tobacco: Never  Vaping Use   Vaping status: Never Used  Substance and Sexual Activity   Alcohol use: Yes    Alcohol/week: 0.0 standard drinks of alcohol    Comment: occasional/wine on the weekends   Drug use: No   Sexual activity: Not on file  Other Topics Concern   Not on file  Social History Narrative   Right handed   Lives alone one story home   Social Determinants of Health   Financial Resource Strain: Not on file  Food Insecurity: No Food Insecurity (07/17/2022)   Hunger Vital Sign    Worried About Running Out of Food in the Last Year: Never true    Ran Out of Food in the Last Year: Never  true  Transportation Needs: No Transportation Needs (07/17/2022)   PRAPARE - Transportation    Lack of Transportation (Medical): No    Lack of Transportation (Non-Medical): No  Physical Activity: Not on file  Stress: Not on file  Social Connections: Not on file  Intimate Partner Violence: Not At Risk (07/17/2022)   Humiliation, Afraid, Rape, and Kick questionnaire    Fear of Current or Ex-Partner: No    Emotionally Abused: No    Physically Abused: No    Sexually Abused: No    FAMILY HISTORY: Family History  Problem Relation Age of Onset   Heart disease Mother    Hypertension Mother    Diabetes Father    Hypertension Father    Colon cancer Sister        passed away from colon cancer, in her 43s   Heart attack Maternal Uncle  Prostate cancer Maternal Uncle    Diabetes Other    Pancreatic cancer Neg Hx    Rectal cancer Neg Hx    Esophageal cancer Neg Hx    Stomach cancer Neg Hx     ALLERGIES:  has No Known Allergies.  MEDICATIONS:  Current Outpatient Medications  Medication Sig Dispense Refill   acyclovir (ZOVIRAX) 400 MG tablet Take 1 tablet by mouth twice daily 60 tablet 0   allopurinol (ZYLOPRIM) 300 MG tablet Take 1 tablet by mouth once daily 30 tablet 0   aspirin EC 81 MG tablet Take 81 mg by mouth daily. Swallow whole.     cetirizine (ZYRTEC) 10 MG tablet Take 10 mg by mouth daily as needed for allergies.     clonazePAM (KLONOPIN) 0.5 MG tablet Take 1 tablet (0.5 mg total) by mouth 2 (two) times daily as needed for anxiety. 15 tablet 0   docusate sodium (COLACE) 100 MG capsule Take 1 capsule (100 mg total) by mouth 2 (two) times daily as needed for mild constipation. 30 capsule 3   DULoxetine (CYMBALTA) 20 MG capsule Take 1 capsule by mouth once daily 90 capsule 1   gabapentin (NEURONTIN) 100 MG capsule Take 1 capsule by mouth twice daily 60 capsule 0   hydrochlorothiazide (HYDRODIURIL) 25 MG tablet TAKE 1 TABLET BY MOUTH ONCE DAILY(GIVEN ENOUGH REFILL TO LAST UNTIL  APPOINTMENT.MUST ATTEND APPT.FOR FURTHER REFILLS.) 38 tablet 0   lisinopril (ZESTRIL) 40 MG tablet Take 1 tablet by mouth once daily 90 tablet 0   Methocarbamol 1000 MG TABS Take 1,000 mg by mouth every 6 (six) hours as needed. (Patient taking differently: Take 1,000 mg by mouth every 6 (six) hours as needed (muscle spasms).) 45 tablet 1   morphine (MS CONTIN) 30 MG 12 hr tablet Take 1 tablet (30 mg total) by mouth every 12 (twelve) hours. 60 tablet 0   ondansetron (ZOFRAN) 8 MG tablet Take 1 tablet (8 mg total) by mouth every 8 (eight) hours as needed for nausea or vomiting. 60 tablet 3   pantoprazole (PROTONIX) 40 MG tablet Take 1 tablet (40 mg total) by mouth 2 (two) times daily. 60 tablet 0   polyethylene glycol powder (GLYCOLAX/MIRALAX) 17 GM/SCOOP powder Take 1 capful (17 g) with water by mouth daily. (Patient taking differently: Take 17 g by mouth daily as needed for mild constipation.) 238 g 0   prochlorperazine (COMPAZINE) 10 MG tablet Take 1 tablet (10 mg total) by mouth every 6 (six) hours as needed for nausea or vomiting. 60 tablet 0   REVLIMID 25 MG capsule Take 1 capsule (25 mg total) by mouth daily. Take one capsule daily x 14 days. None for following 7 days Auth # [id number] Date obtained  01/29/23 14 capsule 0   rosuvastatin (CRESTOR) 10 MG tablet Take 1 tablet (10 mg total) by mouth daily. 90 tablet 1   sildenafil (VIAGRA) 50 MG tablet Take 1 tablet (50 mg total) by mouth daily as needed for erectile dysfunction. 10 tablet 0   No current facility-administered medications for this visit.    REVIEW OF SYSTEMS:   Constitutional: ( - ) fevers, ( - )  chills , ( - ) night sweats Eyes: ( - ) blurriness of vision, ( - ) double vision, ( - ) watery eyes Ears, nose, mouth, throat, and face: ( - ) mucositis, ( - ) sore throat Respiratory: ( - ) cough, ( - ) dyspnea, ( - ) wheezes Cardiovascular: ( - ) palpitation, ( - )  chest discomfort, ( - ) lower extremity swelling Gastrointestinal:  ( -  ) nausea, ( +) heartburn, ( - ) change in bowel habits Skin: ( - ) abnormal skin rashes Lymphatics: ( - ) new lymphadenopathy, ( - ) easy bruising Neurological: ( - ) numbness, ( - ) tingling, ( - ) new weaknesses Behavioral/Psych: ( - ) mood change, ( - ) new changes  All other systems were reviewed with the patient and are negative.  PHYSICAL EXAMINATION: ECOG PERFORMANCE STATUS: 1 - Symptomatic but completely ambulatory  There were no vitals filed for this visit. There were no vitals filed for this visit.   GENERAL: Well-appearing young African-American male, alert, no distress and comfortable SKIN: skin color, texture, turgor are normal, no rashes or significant lesions EYES: conjunctiva are pink and non-injected, sclera clear. LUNGS: clear to auscultation and percussion with normal breathing effort HEART: regular rate & rhythm and no murmurs and no lower extremity edema Musculoskeletal: no cyanosis of digits and no clubbing  PSYCH: alert & oriented x 3, fluent speech NEURO: no focal motor/sensory deficits  LABORATORY DATA:  I have reviewed the data as listed    Latest Ref Rng & Units 01/28/2023   11:30 AM 01/12/2023   12:01 PM 12/29/2022   10:28 AM  CBC  WBC 4.0 - 10.5 K/uL 6.1  7.0  6.6   Hemoglobin 13.0 - 17.0 g/dL 16.1  09.6  04.5   Hematocrit 39.0 - 52.0 % 37.7  43.5  38.1   Platelets 150 - 400 K/uL 171  295  208        Latest Ref Rng & Units 01/28/2023   11:30 AM 01/12/2023   12:01 PM 12/29/2022   10:28 AM  CMP  Glucose 70 - 99 mg/dL 409  811  914   BUN 6 - 20 mg/dL 10  12  9    Creatinine 0.61 - 1.24 mg/dL 7.82  9.56  2.13   Sodium 135 - 145 mmol/L 135  136  133   Potassium 3.5 - 5.1 mmol/L 3.6  4.2  4.2   Chloride 98 - 111 mmol/L 101  102  101   CO2 22 - 32 mmol/L 26  25  25    Calcium 8.9 - 10.3 mg/dL 8.9  08.6  9.0   Total Protein 6.5 - 8.1 g/dL 6.7  8.0  7.0   Total Bilirubin 0.3 - 1.2 mg/dL 0.5  0.4  0.5   Alkaline Phos 38 - 126 U/L 59  63  64   AST 15 - 41  U/L 18  19  21    ALT 0 - 44 U/L 22  22  25      Lab Results  Component Value Date   MPROTEIN 0.2 (H) 12/29/2022   MPROTEIN 0.2 (H) 12/01/2022   MPROTEIN 0.2 (H) 11/03/2022   Lab Results  Component Value Date   KPAFRELGTCHN 9.8 01/28/2023   KPAFRELGTCHN 11.2 12/29/2022   KPAFRELGTCHN 9.9 12/01/2022   LAMBDASER 7.1 01/28/2023   LAMBDASER 7.3 12/29/2022   LAMBDASER 8.8 12/01/2022   KAPLAMBRATIO 1.38 01/28/2023   KAPLAMBRATIO 1.53 12/29/2022   KAPLAMBRATIO 1.13 12/01/2022   RADIOGRAPHIC STUDIES: No results found.  ASSESSMENT & PLAN [name] 52 y.o. male with medical history significant for IgG kappa multiple myeloma who presents for a follow up visit.   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  -- Diagnosis confirmed with lytic lesions of the spine as well as biopsy-proven plasmacytoma with 80% plasma cell involvement of  the bone marrow. -- Recommend VRD chemotherapy with intention of proceeding to transplant. -- Labs at each visit to include CBC, CMP, LDH with monthly restaging labs SPEP and serum free light chains -- Started VRD therapy on 02/26/2022. Held Velcade therapy on 09/24/2022 due to development of ocular toxicity. --Switched to Dara/Rev/Dex on 10/02/2022 PLAN: --Due for Cycle 5, Day 1 of Dara/Rev/Dex.  --Labs today reviewed and adequate for treatment. WBC 6.1, hemoglobin 12.4, MCV 87.5, and platelets of 171 --Most recent myeloma labs from 12/28/2021 detected M protein at 0.2 g/dL. Kappa light chain 11.2, Lambda light chain 7.3, Ratio 1.53 --Proceed with treatment today without any dose modifications.  -- Patient has made contact with the bone marrow transplant unit of [facility].  Currently undergoing evaluation there.  Holding Xgeva per their request. --RTC for weekly treatments with q 2 week toxicity checks.   #Erectile Dysfunction -- Likely secondary to chemotherapy. --prescribed Viagra 50 mg p.o. as needed x 10 pills. -- If ineffective  could consider Cialis.  # Leg Discomfort/Restless Leg -- Patient provided with Requip by the emergency department, discontinued due to poor efficacy. --Evaluated by Dr. Barbaraann Cao on 07/28/2022. Recommended to short course of steroids so started prednisone 50 mg daily x 7 days. --Discussed etiologies including iron deficiency so iron levels were checked and was normal.   #Constipation--improving: --Recommend to continue on colace but add miralax as well.   #Pathologic fractures-secondary to MM: --Involving L1, L2 and L3 compression fracture and left humerus fracture.  --Underwent kyphoplasty of L3 fracture on 02/19/2022 --Underwent medullary nailing of left humeral shaft on 02/22/2022 --Underwent kyphoplasty on 06/04/2022.   #Pain Medication -- Per palliative care patient has weaned off MS contin. Currently conitnues on gabapentin 100 mg BID, cymbalta 20 mg and robaxin as needed.  --Under the care of palliative care team.   #Supportive Care -- chemotherapy education complete -- port placement not required -- zofran 8mg  q8H PRN and compazine 10mg  PO q6H for nausea -- acyclovir 400mg  PO BID for VCZ prophylaxis -- allopurinol 300mg  PO daily for TLS prophylaxis -- Dental clearance for Zometa/Denosumab approved.  HOLDING per [facility] transplant request.   No orders of the defined types were placed in this encounter.   All questions were answered. The patient knows to call the clinic with any problems, questions or concerns.  I have spent a total of 30 minutes minutes of face-to-face and non-face-to-face time, preparing to see the patient,performing a medically appropriate examination, counseling and educating the patient, documenting clinical information in the electronic health record,  and care coordination.  Ulysees Barns, MD Department of Hematology/Oncology [facility] Phone: [phone] Pager: [phone] Email: [email or web address]

## 2023-02-01 ENCOUNTER — Other Ambulatory Visit: Payer: Self-pay | Admitting: Nurse Practitioner

## 2023-02-01 LAB — MULTIPLE MYELOMA PANEL, SERUM
Albumin SerPl Elph-Mcnc: 3.4 g/dL (ref 2.9–4.4)
Albumin/Glob SerPl: 1.4 (ref 0.7–1.7)
Alpha 1: 0.2 g/dL (ref 0.0–0.4)
Alpha2 Glob SerPl Elph-Mcnc: 0.9 g/dL (ref 0.4–1.0)
B-Globulin SerPl Elph-Mcnc: 0.9 g/dL (ref 0.7–1.3)
Gamma Glob SerPl Elph-Mcnc: 0.5 g/dL (ref 0.4–1.8)
Globulin, Total: 2.6 g/dL (ref 2.2–3.9)
IgA: 40 mg/dL — ABNORMAL LOW (ref 90–386)
IgG (Immunoglobin G), Serum: 552 mg/dL — ABNORMAL LOW (ref 603–1613)
IgM (Immunoglobulin M), Srm: 27 mg/dL (ref 20–172)
M Protein SerPl Elph-Mcnc: 0.1 g/dL — ABNORMAL HIGH
Total Protein ELP: 6 g/dL (ref 6.0–8.5)

## 2023-02-02 ENCOUNTER — Telehealth: Payer: Self-pay | Admitting: *Deleted

## 2023-02-02 ENCOUNTER — Encounter: Payer: Self-pay | Admitting: Hematology and Oncology

## 2023-02-02 ENCOUNTER — Encounter: Payer: Self-pay | Admitting: Hematology

## 2023-02-02 NOTE — Telephone Encounter (Signed)
LVM for patient to  make  an appointment for disability papers to be filled out, or keep upcoming appt in October if need earlier.

## 2023-02-03 LAB — IFE, DARA-SPECIFIC, SERUM
IgA: 41 mg/dL — ABNORMAL LOW (ref 90–386)
IgG (Immunoglobin G), Serum: 561 mg/dL — ABNORMAL LOW (ref 603–1613)
IgM (Immunoglobulin M), Srm: 26 mg/dL (ref 20–172)

## 2023-02-04 ENCOUNTER — Telehealth: Payer: Self-pay | Admitting: Family Medicine

## 2023-02-04 NOTE — Telephone Encounter (Signed)
Called patient to schedule an appointment to come to see provider to have forms filled out left message to give office a call back

## 2023-02-09 ENCOUNTER — Inpatient Hospital Stay: Payer: BLUE CROSS/BLUE SHIELD

## 2023-02-09 ENCOUNTER — Inpatient Hospital Stay (HOSPITAL_BASED_OUTPATIENT_CLINIC_OR_DEPARTMENT_OTHER): Payer: BLUE CROSS/BLUE SHIELD | Admitting: Nurse Practitioner

## 2023-02-09 ENCOUNTER — Inpatient Hospital Stay (HOSPITAL_BASED_OUTPATIENT_CLINIC_OR_DEPARTMENT_OTHER): Payer: BLUE CROSS/BLUE SHIELD | Admitting: Hematology and Oncology

## 2023-02-09 ENCOUNTER — Encounter: Payer: Self-pay | Admitting: Hematology

## 2023-02-09 ENCOUNTER — Encounter: Payer: Self-pay | Admitting: Nurse Practitioner

## 2023-02-09 DIAGNOSIS — C9000 Multiple myeloma not having achieved remission: Secondary | ICD-10-CM

## 2023-02-09 DIAGNOSIS — Z515 Encounter for palliative care: Secondary | ICD-10-CM | POA: Diagnosis not present

## 2023-02-09 DIAGNOSIS — Z7189 Other specified counseling: Secondary | ICD-10-CM

## 2023-02-09 DIAGNOSIS — G893 Neoplasm related pain (acute) (chronic): Secondary | ICD-10-CM

## 2023-02-09 LAB — CMP (CANCER CENTER ONLY)
ALT: 17 U/L (ref 0–44)
AST: 16 U/L (ref 15–41)
Albumin: 4 g/dL (ref 3.5–5.0)
Alkaline Phosphatase: 58 U/L (ref 38–126)
Anion gap: 8 (ref 5–15)
BUN: 12 mg/dL (ref 6–20)
CO2: 25 mmol/L (ref 22–32)
Calcium: 9 mg/dL (ref 8.9–10.3)
Chloride: 101 mmol/L (ref 98–111)
Creatinine: 0.95 mg/dL (ref 0.61–1.24)
GFR, Estimated: >60 mL/min (ref >60)
Glucose, Bld: 139 mg/dL — ABNORMAL HIGH (ref 70–99)
Potassium: 4.2 mmol/L (ref 3.5–5.1)
Sodium: 134 mmol/L — ABNORMAL LOW (ref 135–145)
Total Bilirubin: 0.3 mg/dL (ref 0.3–1.2)
Total Protein: 6.6 g/dL (ref 6.5–8.1)

## 2023-02-09 LAB — CBC WITH DIFFERENTIAL (CANCER CENTER ONLY)
Abs Immature Granulocytes: 0.06 10*3/uL (ref 0.00–0.07)
Basophils Absolute: 0 10*3/uL (ref 0.0–0.1)
Basophils Relative: 0 %
Eosinophils Absolute: 0.1 10*3/uL (ref 0.0–0.5)
Eosinophils Relative: 1 %
HCT: 36.2 % — ABNORMAL LOW (ref 39.0–52.0)
Hemoglobin: 11.9 g/dL — ABNORMAL LOW (ref 13.0–17.0)
Immature Granulocytes: 1 %
Lymphocytes Relative: 40 %
Lymphs Abs: 2.8 10*3/uL (ref 0.7–4.0)
MCH: 29.1 pg (ref 26.0–34.0)
MCHC: 32.9 g/dL (ref 30.0–36.0)
MCV: 88.5 fL (ref 80.0–100.0)
Monocytes Absolute: 0.9 10*3/uL (ref 0.1–1.0)
Monocytes Relative: 12 %
Neutro Abs: 3.2 10*3/uL (ref 1.7–7.7)
Neutrophils Relative %: 46 %
Platelet Count: 269 10*3/uL (ref 150–400)
RBC: 4.09 MIL/uL — ABNORMAL LOW (ref 4.22–5.81)
RDW: 14.1 % (ref 11.5–15.5)
WBC Count: 7 10*3/uL (ref 4.0–10.5)
nRBC: 0 % (ref 0.0–0.2)

## 2023-02-09 MED ORDER — DIPHENHYDRAMINE HCL 25 MG PO CAPS
50.0000 mg | ORAL_CAPSULE | Freq: Once | ORAL | Status: AC
  Administered 2023-02-09: 50 mg via ORAL
  Filled 2023-02-09: qty 2

## 2023-02-09 MED ORDER — DARATUMUMAB-HYALURONIDASE-FIHJ 1800-30000 MG-UT/15ML ~~LOC~~ SOLN
1800.0000 mg | Freq: Once | SUBCUTANEOUS | Status: AC
  Administered 2023-02-09: 1800 mg via SUBCUTANEOUS
  Filled 2023-02-09: qty 15

## 2023-02-09 MED ORDER — DEXAMETHASONE 4 MG PO TABS
40.0000 mg | ORAL_TABLET | Freq: Once | ORAL | Status: AC
  Administered 2023-02-09: 40 mg via ORAL
  Filled 2023-02-09: qty 10

## 2023-02-09 MED ORDER — FAMOTIDINE 20 MG PO TABS
20.0000 mg | ORAL_TABLET | Freq: Once | ORAL | Status: AC
  Administered 2023-02-09: 20 mg via ORAL
  Filled 2023-02-09: qty 1

## 2023-02-09 MED ORDER — ACETAMINOPHEN 325 MG PO TABS
650.0000 mg | ORAL_TABLET | Freq: Once | ORAL | Status: AC
  Administered 2023-02-09: 650 mg via ORAL
  Filled 2023-02-09: qty 2

## 2023-02-09 NOTE — Progress Notes (Signed)
Per Dr. Leonides Schanz, hold [name] injection due to upcoming dental work.

## 2023-02-09 NOTE — Progress Notes (Signed)
[facility] Telephone:([phone]) [phone]   Fax:([phone]  PROGRESS NOTE  Patient Care Team: Georganna Skeans, MD as PCP - General (Family Medicine) Runell Gess, MD as PCP - Cardiology (Cardiology) Jena Gauss Gerrit Friends, MD as Attending Physician (Gastroenterology)  Hematological/Oncological History # IgG Kappa Multiple Myeloma, 1p32/1q21.  01/04/2022: MRI lumbar spine shows diffuse heterogeneous marrow signal with heterogeneous enhancement throughout the thoracolumbar spine.  Additionally, there is subacute-chronic L3 vertebral body compression fraction. 01/14/2022: establish care with Dr. Leonides Schanz  02/10/2022: bone marrow biopsy shows range of plasma cell involvement from 50% on the biopsy to 60% on aspirate smear slides and close to 90% on the clot section for an overall percentage estimated at approximately 80% overall. 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  10/02/2022: Cycle 1 Day 1 of Dara/Rev/Dex 11/03/2022: Cycle 2 Day 1 of Dara/Rev/Dex 11/30/2022:  Cycle 3 Day 1 of Dara/Rev/Dex 12/29/2022: Cycle 4 Day 1 of Dara/Rev/Dex  Interval History:  [name] 52 y.o. male for continued management of IgG kappa multiple myeloma. The patient's last visit was on 01/28/2023. In the interim, he continues on dara/rev/dex. He presents today for Cycle 5, Day 15 of treatment.   On exam today Mr. Royce reports he has been well overall in the interim since her last visit.  He  reports that his energy levels are little low and he "stays tired".  He reports his energy is currently a 7 out of 10.  He reports his appetite has been good but some days he has no appetite.  He reports he always make sure he has food on his stomach before chemotherapy where his chemo pills.  He reports that he is continuing to undergo dental evaluation and workup prior to his bone marrow transplant at [facility].  He reports the anticipated start in October 2024.  Otherwise he has been doing fine and has no questions concerns or complaints today.  He denies fevers, chills, sweats, shortness of breath, chest pain or cough.  A full 10 point ROS was otherwise negative.  MEDICAL HISTORY:  Past Medical History:  Diagnosis Date   Allergy    Anemia    Atypical chest pain    Cancer (HCC)    Multiple myeloma with normocytic anemia   GERD (gastroesophageal reflux disease)    Hepatic cyst    Benign by MRI   Hiatal hernia    History of colonic polyps    Hyperlipidemia    Hypertension    Rectal bleeding    Tobacco abuse     SURGICAL HISTORY: Past Surgical History:  Procedure Laterality Date   COLONOSCOPY  01/10/2009     RMR: Normal rectum, normal colon; repeat in 2015 due to FH of colon cancer   COLONOSCOPY N/A 01/09/2014   AVW:[id number] colonic polyps-removed as described   COLONOSCOPY N/A 09/18/2016   Procedure: COLONOSCOPY;  Surgeon: Corbin Ade, MD;  Location: [facility];  Service:  Endoscopy;  Laterality: N/A;  730    ESOPHAGOGASTRODUODENOSCOPY  10/04/2007   RMR: Distal esophageal erosions consistent with erosive reflux esophagitis, patulous gastroesophageal junction status post passage of a  Maloney dilator, 56 [place].  Otherwise, unremarkable esophagus.  Hiatal hernia.  Otherwise normal stomach.  Bulbar erosion   ESOPHAGOGASTRODUODENOSCOPY N/A 01/09/2014   Erosive reflux esophagitis. Small hiatal hernia   HUMERUS IM NAIL Left 02/22/2022   Procedure:  INTRAMEDULLARY (IM) NAIL HUMERAL;  Surgeon: Yolonda Kida, MD;  Location: [facility] OR;  Service: Orthopedics;  Laterality: Left;   I & D EXTREMITY Left 07/26/2018   Procedure: IRRIGATION AND DEBRIDEMENT EXTREMITY;  Surgeon: Knute Neu, MD;  Location: [facility] OR;  Service: Plastics;  Laterality: Left;   IR BONE TUMOR(S)RF ABLATION  02/05/2022   IR BONE TUMOR(S)RF ABLATION  02/24/2022   IR KYPHO EA ADDL LEVEL THORACIC OR LUMBAR  02/19/2022   IR KYPHO LUMBAR INC FX REDUCE BONE BX UNI/BIL CANNULATION INC/IMAGING  02/05/2022   IR KYPHO LUMBAR INC FX REDUCE BONE BX UNI/BIL CANNULATION INC/IMAGING  02/19/2022   PERCUTANEOUS PINNING Left 07/26/2018   Procedure: PERCUTANEOUS PINNING EXTREMITY;  Surgeon: Knute Neu, MD;  Location: [facility] OR;  Service: Plastics;  Laterality: Left;    SOCIAL HISTORY: Social History   Socioeconomic History   Marital status: Divorced    Spouse name: Not on file   Number of children: 2   Years of education: Not on file   Highest education level: Not on file  Occupational History   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[employer]    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[employer]  Tobacco Use   Smoking status: Former    Current packs/day: 0.00    Average packs/day: 0.3 packs/day for 10.0 years (2.5 ttl pk-yrs)    Types: Cigarettes    Start date: 12/25/2011    Quit date: 12/24/2021    Years since quitting: 1.1   Smokeless tobacco: Never  Vaping Use   Vaping status: Never Used  Substance and Sexual Activity   Alcohol use: Yes    Alcohol/week: 0.0 standard drinks of alcohol    Comment: occasional/wine on the weekends   Drug use: No   Sexual activity: Not on file  Other Topics Concern   Not on file  Social History Narrative   Right handed   Lives alone one story home   Social Determinants of Health   Financial Resource Strain: Not on file  Food Insecurity: No Food Insecurity (07/17/2022)   Hunger Vital Sign    Worried About Running Out of Food in the Last Year:  Never true    Ran Out of Food in the Last Year: Never true  Transportation Needs: No Transportation Needs (07/17/2022)   PRAPARE - Transportation    Lack of Transportation (Medical): No    Lack of Transportation (Non-Medical): No  Physical Activity: Not on file  Stress: Not on file  Social Connections: Not on file  Intimate Partner Violence: Not At Risk (07/17/2022)   Humiliation, Afraid, Rape, and Kick questionnaire    Fear of Current or Ex-Partner: No    Emotionally Abused: No    Physically Abused: No    Sexually Abused: No    FAMILY HISTORY: Family History  Problem Relation Age of Onset   Heart disease Mother    Hypertension Mother    Diabetes Father    Hypertension Father    Colon cancer Sister        passed away from colon cancer, in  her 34s   Heart attack Maternal Uncle    Prostate cancer Maternal Uncle    Diabetes Other    Pancreatic cancer Neg Hx    Rectal cancer Neg Hx    Esophageal cancer Neg Hx    Stomach cancer Neg Hx     ALLERGIES:  has No Known Allergies.  MEDICATIONS:  Current Outpatient Medications  Medication Sig Dispense Refill   acyclovir (ZOVIRAX) 400 MG tablet Take 1 tablet by mouth twice daily 60 tablet 0   allopurinol (ZYLOPRIM) 300 MG tablet Take 1 tablet by mouth once daily 30 tablet 3   aspirin EC 81 MG tablet Take 81 mg by mouth daily. Swallow whole.     cetirizine (ZYRTEC) 10 MG tablet Take 10 mg by mouth daily as needed for allergies.     clonazePAM (KLONOPIN) 0.5 MG tablet Take 1 tablet (0.5 mg total) by mouth 2 (two) times daily as needed for anxiety. 15 tablet 0   docusate sodium (COLACE) 100 MG capsule Take 1 capsule (100 mg total) by mouth 2 (two) times daily as needed for mild constipation. 30 capsule 3   DULoxetine (CYMBALTA) 20 MG capsule Take 1 capsule by mouth once daily 90 capsule 1   gabapentin (NEURONTIN) 100 MG capsule Take 1 capsule by mouth twice daily 60 capsule 0   hydrochlorothiazide (HYDRODIURIL) 25 MG tablet TAKE 1 TABLET  BY MOUTH ONCE DAILY(GIVEN ENOUGH REFILL TO LAST UNTIL APPOINTMENT.MUST ATTEND APPT.FOR FURTHER REFILLS.) 38 tablet 0   lisinopril (ZESTRIL) 40 MG tablet Take 1 tablet by mouth once daily 90 tablet 0   Methocarbamol 1000 MG TABS Take 1,000 mg by mouth every 6 (six) hours as needed. (Patient taking differently: Take 1,000 mg by mouth every 6 (six) hours as needed (muscle spasms).) 45 tablet 1   morphine (MS CONTIN) 30 MG 12 hr tablet Take 1 tablet (30 mg total) by mouth every 12 (twelve) hours. 60 tablet 0   ondansetron (ZOFRAN) 8 MG tablet Take 1 tablet (8 mg total) by mouth every 8 (eight) hours as needed for nausea or vomiting. 60 tablet 3   pantoprazole (PROTONIX) 40 MG tablet Take 1 tablet (40 mg total) by mouth 2 (two) times daily. 60 tablet 0   polyethylene glycol powder (GLYCOLAX/MIRALAX) 17 GM/SCOOP powder Take 1 capful (17 g) with water by mouth daily. (Patient taking differently: Take 17 g by mouth daily as needed for mild constipation.) 238 g 0   prochlorperazine (COMPAZINE) 10 MG tablet Take 1 tablet (10 mg total) by mouth every 6 (six) hours as needed for nausea or vomiting. 60 tablet 0   REVLIMID 25 MG capsule Take 1 capsule (25 mg total) by mouth daily. Take one capsule daily x 14 days. None for following 7 days Auth # [id number] Date obtained  01/29/23 14 capsule 0   rosuvastatin (CRESTOR) 10 MG tablet Take 1 tablet (10 mg total) by mouth daily. 90 tablet 1   sildenafil (VIAGRA) 50 MG tablet Take 1 tablet (50 mg total) by mouth daily as needed for erectile dysfunction. 10 tablet 0   No current facility-administered medications for this visit.    REVIEW OF SYSTEMS:   Constitutional: ( - ) fevers, ( - )  chills , ( - ) night sweats Eyes: ( - ) blurriness of vision, ( - ) double vision, ( - ) watery eyes Ears, nose, mouth, throat, and face: ( - ) mucositis, ( - ) sore throat Respiratory: ( - ) cough, ( - )  dyspnea, ( - ) wheezes Cardiovascular: ( - ) palpitation, ( - ) chest discomfort, (  - ) lower extremity swelling Gastrointestinal:  ( - ) nausea, ( +) heartburn, ( - ) change in bowel habits Skin: ( - ) abnormal skin rashes Lymphatics: ( - ) new lymphadenopathy, ( - ) easy bruising Neurological: ( - ) numbness, ( - ) tingling, ( - ) new weaknesses Behavioral/Psych: ( - ) mood change, ( - ) new changes  All other systems were reviewed with the patient and are negative.  PHYSICAL EXAMINATION: ECOG PERFORMANCE STATUS: 1 - Symptomatic but completely ambulatory  Vitals:   02/09/23 0922  BP: (!) 141/88  Pulse: 86  Resp: 16  Temp: 98.1 F (36.7 C)  SpO2: 100%   Filed Weights   02/09/23 0922  Weight: 245 lb 3.2 oz (111.2 kg)     GENERAL: Well-appearing young African-American male, alert, no distress and comfortable SKIN: skin color, texture, turgor are normal, no rashes or significant lesions EYES: conjunctiva are pink and non-injected, sclera clear. LUNGS: clear to auscultation and percussion with normal breathing effort HEART: regular rate & rhythm and no murmurs and no lower extremity edema Musculoskeletal: no cyanosis of digits and no clubbing  PSYCH: alert & oriented x 3, fluent speech NEURO: no focal motor/sensory deficits  LABORATORY DATA:  I have reviewed the data as listed    Latest Ref Rng & Units 02/09/2023    8:59 AM 01/28/2023   11:30 AM 01/12/2023   12:01 PM  CBC  WBC 4.0 - 10.5 K/uL 7.0  6.1  7.0   Hemoglobin 13.0 - 17.0 g/dL 47.8  29.5  62.1   Hematocrit 39.0 - 52.0 % 36.2  37.7  43.5   Platelets 150 - 400 K/uL 269  171  295        Latest Ref Rng & Units 02/09/2023    8:59 AM 01/28/2023   11:30 AM 01/12/2023   12:01 PM  CMP  Glucose 70 - 99 mg/dL 308  657  846   BUN 6 - 20 mg/dL 12  10  12    Creatinine 0.61 - 1.24 mg/dL 9.62  9.52  8.41   Sodium 135 - 145 mmol/L 134  135  136   Potassium 3.5 - 5.1 mmol/L 4.2  3.6  4.2   Chloride 98 - 111 mmol/L 101  101  102   CO2 22 - 32 mmol/L 25  26  25    Calcium 8.9 - 10.3 mg/dL 9.0  8.9  32.4    Total Protein 6.5 - 8.1 g/dL 6.6  6.7  8.0   Total Bilirubin 0.3 - 1.2 mg/dL 0.3  0.5  0.4   Alkaline Phos 38 - 126 U/L 58  59  63   AST 15 - 41 U/L 16  18  19    ALT 0 - 44 U/L 17  22  22      Lab Results  Component Value Date   MPROTEIN 0.1 (H) 01/28/2023   MPROTEIN 0.2 (H) 12/29/2022   MPROTEIN 0.2 (H) 12/01/2022   Lab Results  Component Value Date   KPAFRELGTCHN 9.8 01/28/2023   KPAFRELGTCHN 11.2 12/29/2022   KPAFRELGTCHN 9.9 12/01/2022   LAMBDASER 7.1 01/28/2023   LAMBDASER 7.3 12/29/2022   LAMBDASER 8.8 12/01/2022   KAPLAMBRATIO 1.38 01/28/2023   KAPLAMBRATIO 1.53 12/29/2022   KAPLAMBRATIO 1.13 12/01/2022   RADIOGRAPHIC STUDIES: No results found.  ASSESSMENT & PLAN NACHMAN GUTTERY 52 y.o. male with medical history significant  for IgG kappa multiple myeloma who presents for a follow up visit.   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 -- Recommend VRD chemotherapy with intention of proceeding to transplant. -- Labs at each visit to include CBC, CMP, LDH with monthly restaging labs SPEP and serum free light chains -- Started VRD therapy on 02/26/2022. Held Velcade therapy on 09/24/2022 due to development of ocular toxicity. --Switched to Dara/Rev/Dex on 10/02/2022 PLAN: --Due for Cycle 5, Day 15 of Dara/Rev/Dex.  --Labs today reviewed and adequate for treatment. WBC 7.0, hemoglobin 9.9, MCV 88.5, and platelets of 269 --Most recent myeloma labs from 12/28/2021 detected M protein at 0.1 with normalized serum free light chain and ratio. --Proceed with treatment today without any dose modifications.  -- Patient has made contact with the bone marrow transplant unit of [facility].  Currently undergoing evaluation there.  Holding Xgeva per their request. --RTC for weekly treatments with q 2 week toxicity checks.    #Erectile Dysfunction -- Likely secondary to chemotherapy. --prescribed Viagra 50 mg p.o. as needed x 10 pills. -- If ineffective could consider Cialis.  # Leg Discomfort/Restless Leg -- Patient provided with Requip by the emergency department, discontinued due to poor efficacy. --Evaluated by Dr. Barbaraann Cao on 07/28/2022. Recommended to short course of steroids so started prednisone 50 mg daily x 7 days. --Discussed etiologies including iron deficiency so iron levels were checked and was normal.   #Constipation--improving: --Recommend to continue on colace but add miralax as well.   #Pathologic fractures-secondary to MM: --Involving L1, L2 and L3 compression fracture and left humerus fracture.  --Underwent kyphoplasty of L3 fracture on 02/19/2022 --Underwent medullary nailing of left humeral shaft on 02/22/2022 --Underwent kyphoplasty on 06/04/2022.   #Pain Medication -- Per palliative care patient has weaned off MS contin. Currently conitnues on gabapentin 100 mg BID, cymbalta 20 mg and robaxin as needed.  --Under the care of palliative care team.   #Supportive Care -- chemotherapy education complete -- port placement not required -- zofran 8mg  q8H PRN and compazine 10mg  PO q6H for nausea -- acyclovir 400mg  PO BID for VCZ prophylaxis -- allopurinol 300mg  PO daily for TLS prophylaxis -- Dental clearance for Zometa/Denosumab approved.  HOLDING per [facility] transplant request.   No orders of the defined types were placed in this encounter.   All questions were answered. The patient knows to call the clinic with any problems, questions or concerns.  I have spent a total of 30 minutes minutes of face-to-face and non-face-to-face time, preparing to see the patient,performing a medically appropriate examination, counseling and educating the patient, documenting clinical information in the electronic health record,  and care coordination.  Ulysees Barns, MD Department of  Hematology/Oncology [facility] Phone: [phone] Pager: [phone] Email: [email or web address]

## 2023-02-09 NOTE — Progress Notes (Signed)
Palliative Medicine [facility]  Telephone:([phone] [phone]   Name: [name] Date: 02/09/2023 MRN: [id number]  DOB: 04/13/1971  Patient Care Team: Georganna Skeans, MD as PCP - General (Family Medicine) Runell Gess, MD as PCP - Cardiology (Cardiology) Jena Gauss Gerrit Friends, MD as Attending Physician (Gastroenterology)   INTERVAL HISTORY: [name] is a 52 y.o. male with oncologic medical history including IgG Kappa Multiple Myeloma, L1, L2, and L3 vertebral compression fraction s/p kyphoplasty(9/27) and IM nailing of left humeral shaft (9/30), currently undergoing VRD therapy.  Palliative ask to see for symptom management.   SOCIAL HISTORY:     reports that he quit smoking about 13 months ago. His smoking use included cigarettes. He started smoking about 11 years ago. He has a 2.5 pack-year smoking history. He has never used smokeless tobacco. He reports current alcohol use. He reports that he does not use drugs.  ADVANCE DIRECTIVES:    CODE STATUS:   PAST MEDICAL HISTORY: Past Medical History:  Diagnosis Date   Allergy    Anemia    Atypical chest pain    Cancer (HCC)    Multiple myeloma with normocytic anemia   GERD (gastroesophageal reflux disease)    Hepatic cyst    Benign by MRI   Hiatal hernia    History of colonic polyps    Hyperlipidemia    Hypertension    Rectal bleeding    Tobacco abuse     ALLERGIES:  has No Known Allergies.  MEDICATIONS:  Current Outpatient Medications  Medication Sig Dispense Refill   acyclovir (ZOVIRAX) 400 MG tablet Take 1 tablet by mouth twice daily 60 tablet 0   allopurinol (ZYLOPRIM) 300 MG tablet Take 1 tablet by mouth once daily 30 tablet 3   aspirin EC 81 MG tablet Take 81 mg by mouth daily. Swallow whole.     cetirizine (ZYRTEC) 10 MG tablet Take 10 mg by mouth daily as needed for allergies.     clonazePAM (KLONOPIN) 0.5 MG tablet Take 1 tablet (0.5 mg total) by mouth 2 (two)  times daily as needed for anxiety. 15 tablet 0   docusate sodium (COLACE) 100 MG capsule Take 1 capsule (100 mg total) by mouth 2 (two) times daily as needed for mild constipation. 30 capsule 3   DULoxetine (CYMBALTA) 20 MG capsule Take 1 capsule by mouth once daily 90 capsule 1   gabapentin (NEURONTIN) 100 MG capsule Take 1 capsule by mouth twice daily 60 capsule 0   hydrochlorothiazide (HYDRODIURIL) 25 MG tablet TAKE 1 TABLET BY MOUTH ONCE DAILY(GIVEN ENOUGH REFILL TO LAST UNTIL APPOINTMENT.MUST ATTEND APPT.FOR FURTHER REFILLS.) 38 tablet 0   lisinopril (ZESTRIL) 40 MG tablet Take 1 tablet by mouth once daily 90 tablet 0   Methocarbamol 1000 MG TABS Take 1,000 mg by mouth every 6 (six) hours as needed. (Patient taking differently: Take 1,000 mg by mouth every 6 (six) hours as needed (muscle spasms).) 45 tablet 1   morphine (MS CONTIN) 30 MG 12 hr tablet Take 1 tablet (30 mg total) by mouth every 12 (twelve) hours. 60 tablet 0   ondansetron (ZOFRAN) 8 MG tablet Take 1 tablet (8 mg total) by mouth every 8 (eight) hours as needed for nausea or vomiting. 60 tablet 3   pantoprazole (PROTONIX) 40 MG tablet Take 1 tablet (40 mg total) by mouth 2 (two) times daily. 60 tablet 0   polyethylene glycol powder (GLYCOLAX/MIRALAX) 17 GM/SCOOP powder Take 1 capful (  17 g) with water by mouth daily. (Patient taking differently: Take 17 g by mouth daily as needed for mild constipation.) 238 g 0   prochlorperazine (COMPAZINE) 10 MG tablet Take 1 tablet (10 mg total) by mouth every 6 (six) hours as needed for nausea or vomiting. 60 tablet 0   REVLIMID 25 MG capsule Take 1 capsule (25 mg total) by mouth daily. Take one capsule daily x 14 days. None for following 7 days Auth # [id number] Date obtained  01/29/23 14 capsule 0   rosuvastatin (CRESTOR) 10 MG tablet Take 1 tablet (10 mg total) by mouth daily. 90 tablet 1   sildenafil (VIAGRA) 50 MG tablet Take 1 tablet (50 mg total) by mouth daily as needed for erectile  dysfunction. 10 tablet 0   No current facility-administered medications for this visit.    VITAL SIGNS: There were no vitals taken for this visit. There were no vitals filed for this visit.  Estimated body mass index is 36.25 kg/m as calculated from the following:   Height as of 01/12/23: 5\' 9"  (1.753 m).   Weight as of 01/28/23: 245 lb 8 oz (111.4 kg).   PERFORMANCE STATUS (ECOG) : 1 - Symptomatic but completely ambulatory  Assessment NAD, ambulatory RRR Normal breathing pattern AAO x4  IMPRESSION: I saw [name] during his infusion. No acute distress. Family present. Is active. Denies nausea, vomiting, constipation, or diarrhea. Appetite is good. Is feeling well overall.   Neoplasm related pain [name] reports pain continues to be well-controlled.    MS Contin 30 mg twice daily.  Continues with Gabapentin 100 mg twice daily, Cymbalta 20 mg, and robaxin as needed. Taking medications as prescribed. Does not require breakthrough medication. Will continue to follow. Wean medications as appropriate.   Nausea/Indigestion Resolved. Appetite is doing good. Weight stable at 245lbs.   Constipation Resolved with current regimen  Overall [name] is much improved.  Appreciative of where he is in his health journey.  He knows to contact our office as needed with any concerns.  PLAN:  MS Contin 30 mg daily Gabapentin 100 mg twice daily Cymbalta 20 mg daily  Zofran and Compazine as needed for nausea Miralax daily for constipation I will plan to see patient back in 4-6 weeks in collaboration with other oncology appointments.  Patient knows to contact office sooner if needed.   Patient expressed understanding and was in agreement with this plan. He also understands that He can call the clinic at any time with any questions, concerns, or complaints.    Any controlled substances utilized were prescribed in the context of palliative care. PDMP has been reviewed.    Visit consisted of  counseling and education dealing with the complex and emotionally intense issues of symptom management and palliative care in the setting of serious and potentially life-threatening illness.Greater than 50%  of this time was spent counseling and coordinating care related to the above assessment and plan.  Willette Alma, AGPCNP-BC  Palliative Medicine Team/[facility]  *Please note that this is a verbal dictation therefore any spelling or grammatical errors are due to the "[employer]agon [employer] One" system interpretation.

## 2023-02-09 NOTE — Patient Instructions (Signed)
[facility]  Discharge Instructions: Thank you for choosing [facility] to provide your oncology and hematology care.   If you have a lab appointment with the Cancer Center, please go directly to the Cancer Center and check in at the registration area.   Wear comfortable clothing and clothing appropriate for easy access to any Portacath or PICC line.   We strive to give you quality time with your provider. You may need to reschedule your appointment if you arrive late (15 or more minutes).  Arriving late affects you and other patients whose appointments are after yours.  Also, if you miss three or more appointments without notifying the office, you may be dismissed from the clinic at the provider's discretion.      For prescription refill requests, have your pharmacy contact our office and allow 72 hours for refills to be completed.    Today you received the following chemotherapy and/or immunotherapy agents Darzalex Faspro      To help prevent nausea and vomiting after your treatment, we encourage you to take your nausea medication as directed.  BELOW ARE SYMPTOMS THAT SHOULD BE REPORTED IMMEDIATELY: *FEVER GREATER THAN 100.4 F (38 C) OR HIGHER *CHILLS OR SWEATING *NAUSEA AND VOMITING THAT IS NOT CONTROLLED WITH YOUR NAUSEA MEDICATION *UNUSUAL SHORTNESS OF BREATH *UNUSUAL BRUISING OR BLEEDING *URINARY PROBLEMS (pain or burning when urinating, or frequent urination) *BOWEL PROBLEMS (unusual diarrhea, constipation, pain near the anus) TENDERNESS IN MOUTH AND THROAT WITH OR WITHOUT PRESENCE OF ULCERS (sore throat, sores in mouth, or a toothache) UNUSUAL RASH, SWELLING OR PAIN  UNUSUAL VAGINAL DISCHARGE OR ITCHING   Items with * indicate a potential emergency and should be followed up as soon as possible or go to the Emergency Department if any problems should occur.  Please show the CHEMOTHERAPY ALERT CARD or IMMUNOTHERAPY ALERT CARD at  check-in to the Emergency Department and triage nurse.  Should you have questions after your visit or need to cancel or reschedule your appointment, please contact [facility]  Dept: [phone]  and follow the prompts.  Office hours are 8:00 a.m. to 4:30 p.m. Monday - Friday. Please note that voicemails left after 4:00 p.m. may not be returned until the following business day.  We are closed weekends and major holidays. You have access to a nurse at all times for urgent questions. Please call the main number to the clinic Dept: [phone] and follow the prompts.   For any non-urgent questions, you may also contact your provider using MyChart. We now offer e-Visits for anyone 73 and older to request care online for non-urgent symptoms. For details visit [email or web address].   Also download the MyChart app! Go to the app store, search "MyChart", open the app, select , and log in with your MyChart username and password.

## 2023-02-11 ENCOUNTER — Other Ambulatory Visit: Payer: Self-pay | Admitting: Family Medicine

## 2023-02-11 ENCOUNTER — Ambulatory Visit (INDEPENDENT_AMBULATORY_CARE_PROVIDER_SITE_OTHER): Payer: BLUE CROSS/BLUE SHIELD | Admitting: Family Medicine

## 2023-02-11 VITALS — BP 128/89 | HR 109 | Temp 98.1°F | Resp 16

## 2023-02-11 DIAGNOSIS — I10 Essential (primary) hypertension: Secondary | ICD-10-CM

## 2023-02-11 DIAGNOSIS — C9000 Multiple myeloma not having achieved remission: Secondary | ICD-10-CM

## 2023-02-11 DIAGNOSIS — Z0289 Encounter for other administrative examinations: Secondary | ICD-10-CM | POA: Diagnosis not present

## 2023-02-11 DIAGNOSIS — F32A Depression, unspecified: Secondary | ICD-10-CM

## 2023-02-11 DIAGNOSIS — F419 Anxiety disorder, unspecified: Secondary | ICD-10-CM | POA: Diagnosis not present

## 2023-02-16 ENCOUNTER — Encounter: Payer: Self-pay | Admitting: Family Medicine

## 2023-02-16 NOTE — Progress Notes (Signed)
Established Patient Office Visit  Subjective    Patient ID: [name], male    DOB: [birth year]  Age: 52 y.o. MRN: [id number]  CC:  Chief Complaint  Patient presents with   Disabilty papers    HPI [name] presents for follow up for chronic med issues and completion of forms for FMLA/disability.   Outpatient Encounter Medications as of 02/11/2023  Medication Sig   chlorhexidine (PERIDEX) 0.12 % solution Swish 5 ml for 1 minute and spit out two times a day   diazepam (VALIUM) 5 MG tablet Take 5 mg by mouth 2 (two) times daily as needed.   metoprolol tartrate (LOPRESSOR) 50 MG tablet 50 mg by oral route.   Na Sulfate-K Sulfate-Mg Sulf 17.5-3.13-1.6 GM/177ML SOLN SMARTSIG:1 Kit(s) By Mouth Once   naloxone (NARCAN) nasal spray 4 mg/0.1 mL Call 911. Administer a single spray in one nostril. If no or minimal response after 2 to 3 minutes, an additional dose may be given in the alternate nostril.   nirmatrelvir & ritonavir (PAXLOVID, 300/100,) 20 x 150 MG & 10 x 100MG  TBPK TAKE 3 TABLETS TOGETHER (TWO 150 MG NIRMATRELVIR TABLETS AND ONE 100 MG RITONAVIR TABLET) BY MOUTH TWICE DAILY FOR 5 DAYS.   rosuvastatin (CRESTOR) 10 MG tablet Take 1 tablet by mouth daily.   acyclovir (ZOVIRAX) 400 MG tablet Take 1 tablet by mouth twice daily   allopurinol (ZYLOPRIM) 300 MG tablet Take 1 tablet by mouth once daily   aspirin EC 81 MG tablet Take 81 mg by mouth daily. Swallow whole.   cetirizine (ZYRTEC) 10 MG tablet Take 10 mg by mouth daily as needed for allergies.   clonazePAM (KLONOPIN) 0.5 MG tablet Take 1 tablet (0.5 mg total) by mouth 2 (two) times daily as needed for anxiety.   docusate sodium (COLACE) 100 MG capsule Take 1 capsule (100 mg total) by mouth 2 (two) times daily as needed for mild constipation.   DULoxetine (CYMBALTA) 20 MG capsule Take 1 capsule by mouth once daily   gabapentin (NEURONTIN) 100 MG capsule Take 1 capsule by mouth twice daily   lisinopril (ZESTRIL) 20 MG  tablet Take 1 tablet by mouth daily.   lisinopril (ZESTRIL) 40 MG tablet Take 1 tablet by mouth once daily   meloxicam (MOBIC) 15 MG tablet Take 1 tablet by mouth daily.   Methocarbamol 1000 MG TABS Take 1,000 mg by mouth every 6 (six) hours as needed. (Patient taking differently: Take 1,000 mg by mouth every 6 (six) hours as needed (muscle spasms).)   morphine (MS CONTIN) 30 MG 12 hr tablet Take 1 tablet (30 mg total) by mouth every 12 (twelve) hours.   ondansetron (ZOFRAN) 8 MG tablet Take 1 tablet (8 mg total) by mouth every 8 (eight) hours as needed for nausea or vomiting.   pantoprazole (PROTONIX) 40 MG tablet Take 1 tablet (40 mg total) by mouth 2 (two) times daily.   polyethylene glycol powder (GLYCOLAX/MIRALAX) 17 GM/SCOOP powder Take 1 capful (17 g) with water by mouth daily. (Patient taking differently: Take 17 g by mouth daily as needed for mild constipation.)   predniSONE (DELTASONE) 10 MG tablet See Admin Instructions.   prochlorperazine (COMPAZINE) 10 MG tablet Take 1 tablet (10 mg total) by mouth every 6 (six) hours as needed for nausea or vomiting.   REVLIMID 25 MG capsule Take 1 capsule (25 mg total) by mouth daily. Take one capsule daily x 14 days. None for following 7 days Auth # [id number]  Date obtained  01/29/23   rosuvastatin (CRESTOR) 10 MG tablet Take 1 tablet (10 mg total) by mouth daily.   sildenafil (VIAGRA) 50 MG tablet Take 1 tablet (50 mg total) by mouth daily as needed for erectile dysfunction.   [DISCONTINUED] hydrochlorothiazide (HYDRODIURIL) 25 MG tablet TAKE 1 TABLET BY MOUTH ONCE DAILY(GIVEN ENOUGH REFILL TO LAST UNTIL APPOINTMENT.MUST ATTEND APPT.FOR FURTHER REFILLS.)   No facility-administered encounter medications on file as of 02/11/2023.    Past Medical History:  Diagnosis Date   Allergy    Anemia    Atypical chest pain    Cancer (HCC)    Multiple myeloma with normocytic anemia   GERD (gastroesophageal reflux disease)    Hepatic cyst    Benign by MRI    Hiatal hernia    History of colonic polyps    Hyperlipidemia    Hypertension    Rectal bleeding    Tobacco abuse     Past Surgical History:  Procedure Laterality Date   COLONOSCOPY  01/10/2009     RMR: Normal rectum, normal colon; repeat in 2015 due to FH of colon cancer   COLONOSCOPY N/A 01/09/2014   HCW:CBJSEGBT colonic polyps-removed as described   COLONOSCOPY N/A 09/18/2016   Procedure: COLONOSCOPY;  Surgeon: Corbin Ade, MD;  Location: [facility];  Service: Endoscopy;  Laterality: N/A;  730    ESOPHAGOGASTRODUODENOSCOPY  10/04/2007   RMR: Distal esophageal erosions consistent with erosive reflux esophagitis, patulous gastroesophageal junction status post passage of a  Maloney dilator, 56 [place].  Otherwise, unremarkable esophagus.  Hiatal hernia.  Otherwise normal stomach.  Bulbar erosion   ESOPHAGOGASTRODUODENOSCOPY N/A 01/09/2014   Erosive reflux esophagitis. Small hiatal hernia   HUMERUS IM NAIL Left 02/22/2022   Procedure: INTRAMEDULLARY (IM) NAIL HUMERAL;  Surgeon: Yolonda Kida, MD;  Location: [facility] OR;  Service: Orthopedics;  Laterality: Left;   I & D EXTREMITY Left 07/26/2018   Procedure: IRRIGATION AND DEBRIDEMENT EXTREMITY;  Surgeon: Knute Neu, MD;  Location: [facility] OR;  Service: Plastics;  Laterality: Left;   IR BONE TUMOR(S)RF ABLATION  02/05/2022   IR BONE TUMOR(S)RF ABLATION  02/24/2022   IR KYPHO EA ADDL LEVEL THORACIC OR LUMBAR  02/19/2022   IR KYPHO LUMBAR INC FX REDUCE BONE BX UNI/BIL CANNULATION INC/IMAGING  02/05/2022   IR KYPHO LUMBAR INC FX REDUCE BONE BX UNI/BIL CANNULATION INC/IMAGING  02/19/2022   PERCUTANEOUS PINNING Left 07/26/2018   Procedure: PERCUTANEOUS PINNING EXTREMITY;  Surgeon: Knute Neu, MD;  Location: [facility] OR;  Service: Plastics;  Laterality: Left;    Family History  Problem Relation Age of Onset   Heart disease Mother    Hypertension Mother    Diabetes Father    Hypertension Father    Colon cancer Sister        passed away from  colon cancer, in her 15s   Heart attack Maternal Uncle    Prostate cancer Maternal Uncle    Diabetes Other    Pancreatic cancer Neg Hx    Rectal cancer Neg Hx    Esophageal cancer Neg Hx    Stomach cancer Neg Hx     Social History   Socioeconomic History   Marital status: Divorced    Spouse name: Not on file   Number of children: 2   Years of education: Not on file   Highest education level: Not on file  Occupational History   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[employer]    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[place]  Tobacco Use   Smoking status: Former    Current packs/day: 0.00    Average packs/day: 0.3 packs/day for 10.0 years (2.5 ttl pk-yrs)    Types: Cigarettes    Start date: 12/25/2011    Quit date: 12/24/2021    Years since quitting: 1.1   Smokeless tobacco: Never  Vaping Use   Vaping status: Never Used  Substance and Sexual Activity   Alcohol use: Yes    Alcohol/week: 0.0 standard drinks of alcohol    Comment: occasional/wine on the weekends   Drug use: No   Sexual activity: Not on file  Other Topics Concern   Not on file  Social History Narrative   Right handed   Lives alone one story home   Social Determinants of Health   Financial Resource Strain: Low Risk  (02/13/2023)   Overall Financial Resource Strain (CARDIA)    Difficulty of Paying Living Expenses: Not very hard  Food Insecurity: No Food Insecurity (07/17/2022)   Hunger Vital Sign    Worried About Running Out of Food in the Last Year: Never true    Ran Out of Food in the Last Year: Never true  Transportation Needs: No Transportation Needs (07/17/2022)   PRAPARE - Transportation    Lack of Transportation (Medical): No    Lack of Transportation (Non-Medical): No  Physical Activity: Inactive (02/13/2023)   Exercise Vital Sign    Days of Exercise per Week: 0 days    Minutes of Exercise per Session: 0 min  Stress: No Stress Concern Present (02/13/2023)   [employer] of Occupational Health  - Occupational Stress Questionnaire    Feeling of Stress : Not at all  Social Connections: Unknown (02/13/2023)   Social Connection and Isolation Panel [NHANES]    Frequency of Communication with Friends and Family: More than three times a week    Frequency of Social Gatherings with Friends and Family: More than three times a week    Attends Religious Services: More than 4 times per year    Active Member of [employer] or Organizations: No    Attends Banker Meetings: Never    Marital Status: Not on file  Intimate Partner Violence: Not At Risk (07/17/2022)   Humiliation, Afraid, Rape, and Kick questionnaire    Fear of Current or Ex-Partner: No    Emotionally Abused: No    Physically Abused: No    Sexually Abused: No    Review of Systems  All other systems reviewed and are negative.       Objective    BP 128/89   Pulse (!) 109   Temp 98.1 F (36.7 C) (Oral)   Resp 16   SpO2 95%   Physical Exam Vitals and nursing note reviewed.  Constitutional:      General: He is not in acute distress. Cardiovascular:     Rate and Rhythm: Normal rate and regular rhythm.  Pulmonary:     Effort: Pulmonary effort is normal.     Breath sounds: Normal breath sounds.  Abdominal:     Palpations: Abdomen is soft.     Tenderness: There is no abdominal tenderness.  Neurological:     General: No focal deficit present.     Mental Status: He is alert and oriented to person, place, and time.         Assessment & Plan:   Anxiety and depression  Essential hypertension  Multiple myeloma not having achieved remission (HCC)  Encounter for completion of form  with patient     Return if symptoms worsen or fail to improve.   Tommie Raymond, MD

## 2023-02-17 ENCOUNTER — Other Ambulatory Visit: Payer: Self-pay | Admitting: Hematology and Oncology

## 2023-02-17 DIAGNOSIS — C9000 Multiple myeloma not having achieved remission: Secondary | ICD-10-CM

## 2023-02-20 ENCOUNTER — Other Ambulatory Visit: Payer: Self-pay | Admitting: Orthopedic Surgery

## 2023-02-20 DIAGNOSIS — M546 Pain in thoracic spine: Secondary | ICD-10-CM

## 2023-02-20 DIAGNOSIS — C9000 Multiple myeloma not having achieved remission: Secondary | ICD-10-CM

## 2023-02-20 DIAGNOSIS — M545 Low back pain, unspecified: Secondary | ICD-10-CM

## 2023-02-20 DIAGNOSIS — M8008XA Age-related osteoporosis with current pathological fracture, vertebra(e), initial encounter for fracture: Secondary | ICD-10-CM

## 2023-02-22 NOTE — Progress Notes (Unsigned)
[facility] [phone]   [phone]  PROGRESS NOTE  Patient Care Team: Georganna Skeans, MD as PCP - General (Family Medicine) Runell Gess, MD as PCP - Cardiology (Cardiology) Jena Gauss Gerrit Friends, MD as Attending Physician (Gastroenterology)  Hematological/Oncological History # IgG Kappa Multiple Myeloma, 1p32/1q21.  01/04/2022: MRI lumbar spine shows diffuse heterogeneous marrow signal with heterogeneous enhancement throughout the thoracolumbar spine.  Additionally, there is subacute-chronic L3 vertebral body compression fraction. 01/14/2022: establish care with Dr. Leonides Schanz  02/10/2022: bone marrow biopsy shows range of plasma cell involvement from 50% on the biopsy to 60% on aspirate smear slides and close to 90% on the clot section for an overall percentage estimated at approximately 80% overall. 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  10/02/2022: Cycle 1 Day 1 of Dara/Rev/Dex 11/03/2022: Cycle 2 Day 1 of Dara/Rev/Dex 11/30/2022:  Cycle 3 Day 1 of Dara/Rev/Dex 12/29/2022: Cycle 4 Day 1 of Dara/Rev/Dex 01/26/2023: Cycle 5 Day 1 of Dara/Rev/Dex 02/23/2023: Cycle 6 Day 1 of Dara/Rev/Dex  Interval History:  [name] 52 y.o. male for continued management of IgG kappa multiple myeloma. The patient's last visit was on 02/09/2023. In the interim, he continues on dara/rev/dex. He presents today for Cycle 6, Day 1 of treatment.   On exam today Mr.  [name] reports he has been well overall in the room since her last visit.  He reports he does have some fatigue on occasion.  He reports that he is not having any vomiting or diarrhea but does have some occasional nausea.  He reports his nausea pills help.  He reports his appetite is good.  He is not having any infectious symptoms such as runny nose, sore throat, or cough.  He does have some occasional sinus allergies, mostly due to ragweed.  He notes he is taking his allergy medications with the been helpful.  He notes that he continues to work towards getting his dental evaluation done prior to his bone marrow transplant.  Otherwise that he has been at his baseline level of health and is willing and able to proceed with treatment at this time.[name]  He denies fevers, chills, sweats, shortness of breath, chest pain or cough.  A full 10 point ROS was otherwise negative.  MEDICAL HISTORY:  Past Medical History:  Diagnosis Date   Allergy    Anemia    Atypical chest pain    Cancer (HCC)    Multiple myeloma with normocytic anemia   GERD (gastroesophageal reflux disease)    Hepatic cyst    Benign by MRI   Hiatal hernia    History of colonic polyps    Hyperlipidemia    Hypertension    Rectal bleeding    Tobacco abuse     SURGICAL HISTORY: Past Surgical History:  Procedure Laterality Date   COLONOSCOPY  01/10/2009     RMR: Normal rectum, normal colon; repeat in 2015 due to FH of colon cancer  COLONOSCOPY N/A 01/09/2014   [id number] colonic polyps-removed as described   COLONOSCOPY N/A 09/18/2016   Procedure: COLONOSCOPY;  Surgeon: Corbin Ade, MD;  Location: [facility];  Service: Endoscopy;  Laterality: N/A;  730    ESOPHAGOGASTRODUODENOSCOPY  10/04/2007   RMR: Distal esophageal erosions consistent with erosive reflux esophagitis, patulous gastroesophageal junction status post passage of a  Maloney dilator, 56 [place].  Otherwise, unremarkable esophagus.  Hiatal hernia.  Otherwise normal  stomach.  Bulbar erosion   ESOPHAGOGASTRODUODENOSCOPY N/A 01/09/2014   Erosive reflux esophagitis. Small hiatal hernia   HUMERUS IM NAIL Left 02/22/2022   Procedure: INTRAMEDULLARY (IM) NAIL HUMERAL;  Surgeon: Yolonda Kida, MD;  Location: [facility] OR;  Service: Orthopedics;  Laterality: Left;   I & D EXTREMITY Left 07/26/2018   Procedure: IRRIGATION AND DEBRIDEMENT EXTREMITY;  Surgeon: Knute Neu, MD;  Location: [facility] OR;  Service: Plastics;  Laterality: Left;   IR BONE TUMOR(S)RF ABLATION  02/05/2022   IR BONE TUMOR(S)RF ABLATION  02/24/2022   IR KYPHO EA ADDL LEVEL THORACIC OR LUMBAR  02/19/2022   IR KYPHO LUMBAR INC FX REDUCE BONE BX UNI/BIL CANNULATION INC/IMAGING  02/05/2022   IR KYPHO LUMBAR INC FX REDUCE BONE BX UNI/BIL CANNULATION INC/IMAGING  02/19/2022   PERCUTANEOUS PINNING Left 07/26/2018   Procedure: PERCUTANEOUS PINNING EXTREMITY;  Surgeon: Knute Neu, MD;  Location: [facility] OR;  Service: Plastics;  Laterality: Left;    SOCIAL HISTORY: Social History   Socioeconomic History   Marital status: Divorced    Spouse name: Not on file   Number of children: 2   Years of education: Not on file   Highest education level: Not on file  Occupational History   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[employer]    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[employer]  Tobacco Use   Smoking status: Former    Current packs/day: 0.00    Average packs/day: 0.3 packs/day for 10.0 years (2.5 ttl pk-yrs)    Types: Cigarettes    Start date: 12/25/2011    Quit date: 12/24/2021    Years since quitting: 1.1   Smokeless tobacco: Never  Vaping Use   Vaping status: Never Used  Substance and Sexual Activity   Alcohol use: Yes    Alcohol/week: 0.0 standard drinks of alcohol    Comment: occasional/wine on the weekends   Drug use: No   Sexual activity: Not on file  Other Topics Concern   Not on file  Social History Narrative   Right handed   Lives alone one story home   Social Determinants of Health    Financial Resource Strain: Low Risk  (02/13/2023)   Overall Financial Resource Strain (CARDIA)    Difficulty of Paying Living Expenses: Not very hard  Food Insecurity: No Food Insecurity (07/17/2022)   Hunger Vital Sign    Worried About Running Out of Food in the Last Year: Never true    Ran Out of Food in the Last Year: Never true  Transportation Needs: No Transportation Needs (07/17/2022)   PRAPARE - Transportation    Lack of Transportation (Medical): No    Lack of Transportation (Non-Medical): No  Physical Activity: Inactive (02/13/2023)   Exercise Vital Sign    Days of Exercise per Week: 0 days    Minutes of Exercise per Session: 0 min  Stress: No Stress Concern Present (02/13/2023)   [employer] of Occupational Health - Occupational Stress Questionnaire    Feeling of Stress : Not at all  Social Connections:  Unknown (02/13/2023)   Social Connection and Isolation Panel [NHANES]    Frequency of Communication with Friends and Family: More than three times a week    Frequency of Social Gatherings with Friends and Family: More than three times a week    Attends Religious Services: More than 4 times per year    Active Member of [employer] or Organizations: No    Attends Banker Meetings: Never    Marital Status: Not on file  Intimate Partner Violence: Not At Risk (07/17/2022)   Humiliation, Afraid, Rape, and Kick questionnaire    Fear of Current or Ex-Partner: No    Emotionally Abused: No    Physically Abused: No    Sexually Abused: No    FAMILY HISTORY: Family History  Problem Relation Age of Onset   Heart disease Mother    Hypertension Mother    Diabetes Father    Hypertension Father    Colon cancer Sister        passed away from colon cancer, in her 38s   Heart attack Maternal Uncle    Prostate cancer Maternal Uncle    Diabetes Other    Pancreatic cancer Neg Hx    Rectal cancer Neg Hx    Esophageal cancer Neg Hx    Stomach cancer Neg Hx     ALLERGIES:   has No Known Allergies.  MEDICATIONS:  Current Outpatient Medications  Medication Sig Dispense Refill   acyclovir (ZOVIRAX) 400 MG tablet Take 1 tablet by mouth twice daily 60 tablet 0   allopurinol (ZYLOPRIM) 300 MG tablet Take 1 tablet by mouth once daily 30 tablet 3   aspirin EC 81 MG tablet Take 81 mg by mouth daily. Swallow whole.     cetirizine (ZYRTEC) 10 MG tablet Take 10 mg by mouth daily as needed for allergies.     chlorhexidine (PERIDEX) 0.12 % solution Swish 5 ml for 1 minute and spit out two times a day     clonazePAM (KLONOPIN) 0.5 MG tablet Take 1 tablet (0.5 mg total) by mouth 2 (two) times daily as needed for anxiety. 15 tablet 0   diazepam (VALIUM) 5 MG tablet Take 5 mg by mouth 2 (two) times daily as needed.     docusate sodium (COLACE) 100 MG capsule Take 1 capsule (100 mg total) by mouth 2 (two) times daily as needed for mild constipation. 30 capsule 3   DULoxetine (CYMBALTA) 20 MG capsule Take 1 capsule by mouth once daily 90 capsule 1   gabapentin (NEURONTIN) 100 MG capsule Take 1 capsule by mouth twice daily 60 capsule 0   hydrochlorothiazide (HYDRODIURIL) 25 MG tablet TAKE 1 TABLET BY MOUTH ONCE DAILY . APPOINTMENT REQUIRED FOR FUTURE REFILLS 38 tablet 0   lisinopril (ZESTRIL) 20 MG tablet Take 1 tablet by mouth daily.     lisinopril (ZESTRIL) 40 MG tablet Take 1 tablet by mouth once daily 90 tablet 0   meloxicam (MOBIC) 15 MG tablet Take 1 tablet by mouth daily.     Methocarbamol 1000 MG TABS Take 1,000 mg by mouth every 6 (six) hours as needed. (Patient taking differently: Take 1,000 mg by mouth every 6 (six) hours as needed (muscle spasms).) 45 tablet 1   metoprolol tartrate (LOPRESSOR) 50 MG tablet 50 mg by oral route.     morphine (MS CONTIN) 30 MG 12 hr tablet Take 1 tablet (30 mg total) by mouth every 12 (twelve) hours. 60 tablet 0   Na Sulfate-K Sulfate-Mg Sulf 17.5-3.13-1.6  GM/177ML SOLN SMARTSIG:1 Kit(s) By Mouth Once     naloxone (NARCAN) nasal spray 4  mg/0.1 mL Call 911. Administer a single spray in one nostril. If no or minimal response after 2 to 3 minutes, an additional dose may be given in the alternate nostril.     nirmatrelvir & ritonavir (PAXLOVID, 300/100,) 20 x 150 MG & 10 x 100MG  TBPK TAKE 3 TABLETS TOGETHER (TWO 150 MG NIRMATRELVIR TABLETS AND ONE 100 MG RITONAVIR TABLET) BY MOUTH TWICE DAILY FOR 5 DAYS.     ondansetron (ZOFRAN) 8 MG tablet Take 1 tablet (8 mg total) by mouth every 8 (eight) hours as needed for nausea or vomiting. 60 tablet 3   pantoprazole (PROTONIX) 40 MG tablet Take 1 tablet (40 mg total) by mouth 2 (two) times daily. 60 tablet 0   polyethylene glycol powder (GLYCOLAX/MIRALAX) 17 GM/SCOOP powder Take 1 capful (17 g) with water by mouth daily. (Patient taking differently: Take 17 g by mouth daily as needed for mild constipation.) 238 g 0   predniSONE (DELTASONE) 10 MG tablet See Admin Instructions.     prochlorperazine (COMPAZINE) 10 MG tablet Take 1 tablet (10 mg total) by mouth every 6 (six) hours as needed for nausea or vomiting. 60 tablet 0   REVLIMID 25 MG capsule TAKE 1 CAPSULE BY MOUTH DAILY FOR 14 DAYS, THEN NONE FOR 7 DAYS. 14 capsule 0   rosuvastatin (CRESTOR) 10 MG tablet Take 1 tablet (10 mg total) by mouth daily. 90 tablet 1   rosuvastatin (CRESTOR) 10 MG tablet Take 1 tablet by mouth daily.     sildenafil (VIAGRA) 50 MG tablet Take 1 tablet (50 mg total) by mouth daily as needed for erectile dysfunction. 10 tablet 0   No current facility-administered medications for this visit.    REVIEW OF SYSTEMS:   Constitutional: ( - ) fevers, ( - )  chills , ( - ) night sweats Eyes: ( - ) blurriness of vision, ( - ) double vision, ( - ) watery eyes Ears, nose, mouth, throat, and face: ( - ) mucositis, ( - ) sore throat Respiratory: ( - ) cough, ( - ) dyspnea, ( - ) wheezes Cardiovascular: ( - ) palpitation, ( - ) chest discomfort, ( - ) lower extremity swelling Gastrointestinal:  ( - ) nausea, ( +) heartburn, (  - ) change in bowel habits Skin: ( - ) abnormal skin rashes Lymphatics: ( - ) new lymphadenopathy, ( - ) easy bruising Neurological: ( - ) numbness, ( - ) tingling, ( - ) new weaknesses Behavioral/Psych: ( - ) mood change, ( - ) new changes  All other systems were reviewed with the patient and are negative.  PHYSICAL EXAMINATION: ECOG PERFORMANCE STATUS: 1 - Symptomatic but completely ambulatory  Vitals:   02/23/23 0941  BP: 131/89  Pulse: 93  Resp: 17  Temp: 98.5 F (36.9 C)  SpO2: 98%    Filed Weights   02/23/23 0941  Weight: 244 lb 3.2 oz (110.8 kg)      GENERAL: Well-appearing young African-American male, alert, no distress and comfortable SKIN: skin color, texture, turgor are normal, no rashes or significant lesions EYES: conjunctiva are pink and non-injected, sclera clear. LUNGS: clear to auscultation and percussion with normal breathing effort HEART: regular rate & rhythm and no murmurs and no lower extremity edema Musculoskeletal: no cyanosis of digits and no clubbing  PSYCH: alert & oriented x 3, fluent speech NEURO: no focal motor/sensory deficits  LABORATORY DATA:  I have reviewed the data as listed    Latest Ref Rng & Units 02/23/2023    9:09 AM 02/09/2023    8:59 AM 01/28/2023   11:30 AM  CBC  WBC 4.0 - 10.5 K/uL 6.4  7.0  6.1   Hemoglobin 13.0 - 17.0 g/dL 41.3  24.4  01.0   Hematocrit 39.0 - 52.0 % 36.9  36.2  37.7   Platelets 150 - 400 K/uL 218  269  171        Latest Ref Rng & Units 02/23/2023    9:09 AM 02/09/2023    8:59 AM 01/28/2023   11:30 AM  CMP  Glucose 70 - 99 mg/dL 272  536  644   BUN 6 - 20 mg/dL 10  12  10    Creatinine 0.61 - 1.24 mg/dL 0.34  7.42  5.95   Sodium 135 - 145 mmol/L 134  134  135   Potassium 3.5 - 5.1 mmol/L 3.8  4.2  3.6   Chloride 98 - 111 mmol/L 100  101  101   CO2 22 - 32 mmol/L 26  25  26    Calcium 8.9 - 10.3 mg/dL 9.3  9.0  8.9   Total Protein 6.5 - 8.1 g/dL 6.9  6.6  6.7   Total Bilirubin 0.3 - 1.2 mg/dL 0.6  0.3   0.5   Alkaline Phos 38 - 126 U/L 56  58  59   AST 15 - 41 U/L 16  16  18    ALT 0 - 44 U/L 20  17  22      Lab Results  Component Value Date   MPROTEIN 0.1 (H) 01/28/2023   MPROTEIN 0.2 (H) 12/29/2022   MPROTEIN 0.2 (H) 12/01/2022   Lab Results  Component Value Date   KPAFRELGTCHN 9.8 01/28/2023   KPAFRELGTCHN 11.2 12/29/2022   KPAFRELGTCHN 9.9 12/01/2022   LAMBDASER 7.1 01/28/2023   LAMBDASER 7.3 12/29/2022   LAMBDASER 8.8 12/01/2022   KAPLAMBRATIO 1.38 01/28/2023   KAPLAMBRATIO 1.53 12/29/2022   KAPLAMBRATIO 1.13 12/01/2022   RADIOGRAPHIC STUDIES: No results found.  ASSESSMENT & PLAN [name] 52 y.o. male with medical history significant for IgG kappa multiple myeloma who presents for a follow up visit.   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 -- Recommend VRD chemotherapy with intention of proceeding to transplant. -- Labs at each visit to include CBC, CMP, LDH with monthly restaging labs SPEP and serum free light chains -- Started VRD therapy on 02/26/2022. Held Velcade therapy on 09/24/2022 due to development of ocular toxicity. --Switched to Dara/Rev/Dex on 10/02/2022 PLAN: --Due for Cycle 5, Day 15 of Dara/Rev/Dex.  --Labs today reviewed and adequate for treatment. WBC 6.4, hemoglobin 12.6, MCV 86.4, and platelets of 218 --Most recent myeloma labs from 01/27/2022 detected M protein at 0.1 with normalized serum free light chain and ratio. --Proceed with treatment today without any dose modifications.  -- Patient has made contact with the bone marrow [facility] unit [facility].  Currently undergoing evaluation there.  Holding Xgeva per their request. --RTC for weekly treatments with q 2 week toxicity checks.   #Erectile Dysfunction -- Likely secondary to chemotherapy. --prescribed Viagra 50  mg p.o. as needed x 10 pills.  --Patient notes he is not getting the results he would like, encouraged him to double the dose  to Viagra 100 mg p.o. to see if that is more effective. -- If ineffective could consider Cialis.  # Leg Discomfort/Restless Leg -- Patient provided with Requip by the emergency department, discontinued due to poor efficacy. --Evaluated by Dr. Barbaraann Cao on 07/28/2022. Recommended to short course of steroids so started prednisone 50 mg daily x 7 days. --Discussed etiologies including iron deficiency so iron levels were checked and was normal.   #Constipation--improving: --Recommend to continue on colace but add miralax as well.   #Pathologic fractures-secondary to MM: --Involving L1, L2 and L3 compression fracture and left humerus fracture.  --Underwent kyphoplasty of L3 fracture on 02/19/2022 --Underwent medullary nailing of left humeral shaft on 02/22/2022 --Underwent kyphoplasty on 06/04/2022.   #Pain Medication -- Per palliative care patient has weaned off MS contin. Currently conitnues on gabapentin 100 mg BID, cymbalta 20 mg and robaxin as needed.  --Under the care of palliative care team.   #Supportive Care -- chemotherapy education complete -- port placement not required -- zofran 8mg  q8H PRN and compazine 10mg  PO q6H for nausea -- acyclovir 400mg  PO BID for VCZ prophylaxis -- allopurinol 300mg  PO daily for TLS prophylaxis -- Dental clearance for Zometa/Denosumab approved.  HOLDING per [facility] transplant request.   No orders of the defined types were placed in this encounter.   All questions were answered. The patient knows to call the clinic with any problems, questions or concerns.  I have spent a total of 30 minutes minutes of face-to-face and non-face-to-face time, preparing to see the patient,performing a medically appropriate examination, counseling and educating the patient, documenting clinical information in the electronic health record,  and care  coordination.  Ulysees Barns, MD Department of Hematology/Oncology [facility] at [facility] Phone: [phone] Pager: [phone] Email: [email or web address]

## 2023-02-23 ENCOUNTER — Inpatient Hospital Stay: Payer: BLUE CROSS/BLUE SHIELD

## 2023-02-23 ENCOUNTER — Inpatient Hospital Stay (HOSPITAL_BASED_OUTPATIENT_CLINIC_OR_DEPARTMENT_OTHER): Payer: BLUE CROSS/BLUE SHIELD | Admitting: Hematology and Oncology

## 2023-02-23 VITALS — BP 131/89 | HR 93 | Temp 98.5°F | Resp 17 | Wt 244.2 lb

## 2023-02-23 DIAGNOSIS — Z7189 Other specified counseling: Secondary | ICD-10-CM | POA: Diagnosis not present

## 2023-02-23 DIAGNOSIS — C9000 Multiple myeloma not having achieved remission: Secondary | ICD-10-CM

## 2023-02-23 DIAGNOSIS — G893 Neoplasm related pain (acute) (chronic): Secondary | ICD-10-CM | POA: Diagnosis not present

## 2023-02-23 DIAGNOSIS — R530 Neoplastic (malignant) related fatigue: Secondary | ICD-10-CM | POA: Diagnosis not present

## 2023-02-23 LAB — CBC WITH DIFFERENTIAL (CANCER CENTER ONLY)
Abs Immature Granulocytes: 0.03 10*3/uL (ref 0.00–0.07)
Basophils Absolute: 0 10*3/uL (ref 0.0–0.1)
Basophils Relative: 0 %
Eosinophils Absolute: 0 10*3/uL (ref 0.0–0.5)
Eosinophils Relative: 1 %
HCT: 36.9 % — ABNORMAL LOW (ref 39.0–52.0)
Hemoglobin: 12.6 g/dL — ABNORMAL LOW (ref 13.0–17.0)
Immature Granulocytes: 1 %
Lymphocytes Relative: 27 %
Lymphs Abs: 1.7 10*3/uL (ref 0.7–4.0)
MCH: 29.5 pg (ref 26.0–34.0)
MCHC: 34.1 g/dL (ref 30.0–36.0)
MCV: 86.4 fL (ref 80.0–100.0)
Monocytes Absolute: 0.7 10*3/uL (ref 0.1–1.0)
Monocytes Relative: 12 %
Neutro Abs: 3.8 10*3/uL (ref 1.7–7.7)
Neutrophils Relative %: 59 %
Platelet Count: 218 10*3/uL (ref 150–400)
RBC: 4.27 MIL/uL (ref 4.22–5.81)
RDW: 13.7 % (ref 11.5–15.5)
WBC Count: 6.4 10*3/uL (ref 4.0–10.5)
nRBC: 0 % (ref 0.0–0.2)

## 2023-02-23 LAB — CMP (CANCER CENTER ONLY)
ALT: 20 U/L (ref 0–44)
AST: 16 U/L (ref 15–41)
Albumin: 4.3 g/dL (ref 3.5–5.0)
Alkaline Phosphatase: 56 U/L (ref 38–126)
Anion gap: 8 (ref 5–15)
BUN: 10 mg/dL (ref 6–20)
CO2: 26 mmol/L (ref 22–32)
Calcium: 9.3 mg/dL (ref 8.9–10.3)
Chloride: 100 mmol/L (ref 98–111)
Creatinine: 0.92 mg/dL (ref 0.61–1.24)
GFR, Estimated: >60 mL/min (ref >60)
Glucose, Bld: 115 mg/dL — ABNORMAL HIGH (ref 70–99)
Potassium: 3.8 mmol/L (ref 3.5–5.1)
Sodium: 134 mmol/L — ABNORMAL LOW (ref 135–145)
Total Bilirubin: 0.6 mg/dL (ref 0.3–1.2)
Total Protein: 6.9 g/dL (ref 6.5–8.1)

## 2023-02-23 MED ORDER — FAMOTIDINE 20 MG PO TABS
20.0000 mg | ORAL_TABLET | Freq: Once | ORAL | Status: AC
  Administered 2023-02-23: 20 mg via ORAL
  Filled 2023-02-23: qty 1

## 2023-02-23 MED ORDER — DARATUMUMAB-HYALURONIDASE-FIHJ 1800-30000 MG-UT/15ML ~~LOC~~ SOLN
1800.0000 mg | Freq: Once | SUBCUTANEOUS | Status: AC
  Administered 2023-02-23: 1800 mg via SUBCUTANEOUS
  Filled 2023-02-23: qty 15

## 2023-02-23 MED ORDER — DEXAMETHASONE 4 MG PO TABS
40.0000 mg | ORAL_TABLET | Freq: Once | ORAL | Status: AC
  Administered 2023-02-23: 40 mg via ORAL
  Filled 2023-02-23: qty 10

## 2023-02-23 MED ORDER — ACETAMINOPHEN 325 MG PO TABS
650.0000 mg | ORAL_TABLET | Freq: Once | ORAL | Status: AC
  Administered 2023-02-23: 650 mg via ORAL
  Filled 2023-02-23: qty 2

## 2023-02-23 MED ORDER — DIPHENHYDRAMINE HCL 25 MG PO CAPS
50.0000 mg | ORAL_CAPSULE | Freq: Once | ORAL | Status: AC
  Administered 2023-02-23: 50 mg via ORAL
  Filled 2023-02-23: qty 2

## 2023-02-24 ENCOUNTER — Encounter: Payer: Self-pay | Admitting: Hematology

## 2023-02-24 ENCOUNTER — Encounter: Payer: Self-pay | Admitting: Hematology and Oncology

## 2023-02-24 LAB — KAPPA/LAMBDA LIGHT CHAINS
Kappa free light chain: 10 mg/L (ref 3.3–19.4)
Kappa, lambda light chain ratio: 1.61 (ref 0.26–1.65)
Lambda free light chains: 6.2 mg/L (ref 5.7–26.3)

## 2023-02-25 ENCOUNTER — Other Ambulatory Visit: Payer: Self-pay | Admitting: Nurse Practitioner

## 2023-02-25 DIAGNOSIS — Z515 Encounter for palliative care: Secondary | ICD-10-CM

## 2023-02-25 DIAGNOSIS — C9000 Multiple myeloma not having achieved remission: Secondary | ICD-10-CM

## 2023-02-25 DIAGNOSIS — K30 Functional dyspepsia: Secondary | ICD-10-CM

## 2023-02-25 LAB — MULTIPLE MYELOMA PANEL, SERUM
Albumin SerPl Elph-Mcnc: 3.8 g/dL (ref 2.9–4.4)
Albumin/Glob SerPl: 1.5 (ref 0.7–1.7)
Alpha 1: 0.3 g/dL (ref 0.0–0.4)
Alpha2 Glob SerPl Elph-Mcnc: 0.9 g/dL (ref 0.4–1.0)
B-Globulin SerPl Elph-Mcnc: 1 g/dL (ref 0.7–1.3)
Gamma Glob SerPl Elph-Mcnc: 0.4 g/dL (ref 0.4–1.8)
Globulin, Total: 2.6 g/dL (ref 2.2–3.9)
IgA: 53 mg/dL — ABNORMAL LOW (ref 90–386)
IgG (Immunoglobin G), Serum: 574 mg/dL — ABNORMAL LOW (ref 603–1613)
IgM (Immunoglobulin M), Srm: 27 mg/dL (ref 20–172)
M Protein SerPl Elph-Mcnc: 0.1 g/dL — ABNORMAL HIGH
Total Protein ELP: 6.4 g/dL (ref 6.0–8.5)

## 2023-02-27 ENCOUNTER — Encounter: Payer: Self-pay | Admitting: General Practice

## 2023-02-27 NOTE — Progress Notes (Signed)
[name] Spiritual Care Note  Missed Mr Meikle in infusion this week, so followed up by phone. Introduced Spiritual Care as part of his support team and invited him to [facility] Cancer Support Group. He notes that he already receives the email link for the virtual meeting and hopes to attend online this coming Tuesday, October 8 at 4pm. He is also aware of ongoing chaplain availability, should additional needs/interest arise.   [address] [place], [place], [facility] Pager [phone] Voicemail [phone]

## 2023-03-03 LAB — IFE, DARA-SPECIFIC, SERUM
IgA: 51 mg/dL — ABNORMAL LOW (ref 90–386)
IgG (Immunoglobin G), Serum: 537 mg/dL — ABNORMAL LOW (ref 603–1613)
IgM (Immunoglobulin M), Srm: 28 mg/dL (ref 20–172)

## 2023-03-05 ENCOUNTER — Other Ambulatory Visit: Payer: Self-pay | Admitting: Family Medicine

## 2023-03-08 NOTE — Progress Notes (Unsigned)
[facility] [phone]   [phone]  PROGRESS NOTE  Patient Care Team: Georganna Skeans, MD as PCP - General (Family Medicine) Runell Gess, MD as PCP - Cardiology (Cardiology) Jena Gauss Gerrit Friends, MD as Attending Physician (Gastroenterology)  Hematological/Oncological History # IgG Kappa Multiple Myeloma, 1p32/1q21.  01/04/2022: MRI lumbar spine shows diffuse heterogeneous marrow signal with heterogeneous enhancement throughout the thoracolumbar spine.  Additionally, there is subacute-chronic L3 vertebral body compression fraction. 01/14/2022: establish care with Dr. Leonides Schanz  02/10/2022: bone marrow biopsy shows range of plasma cell involvement from 50% on the biopsy to 60% on aspirate smear slides and close to 90% on the clot section for an overall percentage estimated at approximately 80% overall. 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  10/02/2022: Cycle 1 Day 1 of Dara/Rev/Dex 11/03/2022: Cycle 2 Day 1 of Dara/Rev/Dex 11/30/2022:  Cycle 3 Day 1 of Dara/Rev/Dex 12/29/2022: Cycle 4 Day 1 of Dara/Rev/Dex 01/26/2023: Cycle 5 Day 1 of Dara/Rev/Dex 02/23/2023: Cycle 6 Day 1 of Dara/Rev/Dex  Interval History:  [name] 52 y.o. male for continued management of IgG kappa multiple myeloma. The patient's last visit was on 02/23/2023. In the interim, he continues on dara/rev/dex. He presents today for Cycle 6, Day 15 of treatment.   On exam today [name] reports he has been well overall in the interim since her last visit and completed the requested dental work.  He reports he has a follow-up with a dentist on 03/25/2023.  He notes that he is not having any pain or discomfort in his teeth and no bleeding.  He reports that initially he was having some soreness and Tylenol help control it.  He notes that he has been feeling fine and not having any side effects as a result of our chemotherapy treatment.  He does have some cough but no runny nose or sore throat.  He is not having any issues with vomiting though he does have a little bit of loose stools and nausea.  He reports that the Zofran has been helping with his nausea.  His energy levels have been "so-so".  He is not having any bleeding anywhere and is doing his best to eat well and hydrate.  His appetite remains good.  Otherwise that he has been at his baseline level of health and is willing and able to proceed with treatment at this time.  He denies fevers, chills, sweats, shortness of breath, chest pain.  A full 10 point ROS was otherwise negative.  MEDICAL HISTORY:  Past Medical History:  Diagnosis Date   Allergy    Anemia    Atypical chest pain    Cancer (HCC)    Multiple myeloma with normocytic anemia   GERD (gastroesophageal reflux disease)    Hepatic cyst    Benign by MRI   Hiatal hernia    History of colonic polyps    Hyperlipidemia    Hypertension    Rectal  bleeding    Tobacco abuse     SURGICAL HISTORY: Past Surgical History:  Procedure Laterality Date   COLONOSCOPY  01/10/2009     RMR: Normal rectum, normal colon; repeat in 2015 due to FH of colon cancer   COLONOSCOPY N/A 01/09/2014   [id number] colonic polyps-removed as described   COLONOSCOPY N/A 09/18/2016   Procedure: COLONOSCOPY;  Surgeon: Corbin Ade, MD;  Location: [facility];  Service: Endoscopy;  Laterality: N/A;  730    ESOPHAGOGASTRODUODENOSCOPY  10/04/2007   RMR: Distal esophageal erosions  consistent with erosive reflux esophagitis, patulous gastroesophageal junction status post passage of a  Maloney dilator, 56 [place].  Otherwise, unremarkable esophagus.  Hiatal hernia.  Otherwise normal stomach.  Bulbar erosion   ESOPHAGOGASTRODUODENOSCOPY N/A 01/09/2014   Erosive reflux esophagitis. Small hiatal hernia   HUMERUS IM NAIL Left 02/22/2022   Procedure: INTRAMEDULLARY (IM) NAIL HUMERAL;  Surgeon: Yolonda Kida, MD;  Location: [facility] OR;  Service: Orthopedics;  Laterality: Left;   I & D EXTREMITY Left 07/26/2018   Procedure: IRRIGATION AND DEBRIDEMENT EXTREMITY;  Surgeon: Knute Neu, MD;  Location: [facility] OR;  Service: Plastics;  Laterality: Left;   IR BONE TUMOR(S)RF ABLATION  02/05/2022   IR BONE TUMOR(S)RF ABLATION  02/24/2022   IR KYPHO EA ADDL LEVEL THORACIC OR LUMBAR  02/19/2022   IR KYPHO LUMBAR INC FX REDUCE BONE BX UNI/BIL CANNULATION INC/IMAGING  02/05/2022   IR KYPHO LUMBAR INC FX REDUCE BONE BX UNI/BIL CANNULATION INC/IMAGING  02/19/2022   PERCUTANEOUS PINNING Left 07/26/2018   Procedure: PERCUTANEOUS PINNING EXTREMITY;  Surgeon: Knute Neu, MD;  Location: [facility] OR;  Service: Plastics;  Laterality: Left;    SOCIAL HISTORY: Social History   Socioeconomic History   Marital status: Divorced    Spouse name: Not on file   Number of children: 2   Years of education: Not on file   Highest education level: Not on file  Occupational History   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[employer]    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[employer]  Tobacco Use   Smoking status: Former    Current packs/day: 0.00    Average packs/day: 0.3 packs/day for 10.0 years (2.5 ttl pk-yrs)    Types: Cigarettes    Start date: 12/25/2011    Quit date: 12/24/2021    Years since quitting: 1.2   Smokeless tobacco: Never  Vaping Use   Vaping status: Never Used  Substance and Sexual Activity   Alcohol use: Yes    Alcohol/week: 0.0 standard drinks of alcohol    Comment: occasional/wine on the  weekends   Drug use: No   Sexual activity: Not on file  Other Topics Concern   Not on file  Social History Narrative   Right handed   Lives alone one story home   Social Determinants of Health   Financial Resource Strain: Low Risk  (02/13/2023)   Overall Financial Resource Strain (CARDIA)    Difficulty of Paying Living Expenses: Not very hard  Food Insecurity: No Food Insecurity (07/17/2022)   Hunger Vital Sign    Worried About Running Out of Food in the Last Year: Never true    Ran Out of Food in the Last Year: Never true  Transportation Needs: No Transportation Needs (07/17/2022)   PRAPARE - Transportation    Lack of Transportation (Medical): No    Lack of Transportation (Non-Medical): No  Physical Activity: Inactive (02/13/2023)   Exercise Vital Sign    Days of Exercise per  Week: 0 days    Minutes of Exercise per Session: 0 min  Stress: No Stress Concern Present (02/13/2023)   [employer] of Occupational Health - Occupational Stress Questionnaire    Feeling of Stress : Not at all  Social Connections: Unknown (02/13/2023)   Social Connection and Isolation Panel [NHANES]    Frequency of Communication with Friends and Family: More than three times a week    Frequency of Social Gatherings with Friends and Family: More than three times a week    Attends Religious Services: More than 4 times per year    Active Member of [employer] or Organizations: No    Attends Banker Meetings: Never    Marital Status: Not on file  Intimate Partner Violence: Not At Risk (07/17/2022)   Humiliation, Afraid, Rape, and Kick questionnaire    Fear of Current or Ex-Partner: No    Emotionally Abused: No    Physically Abused: No    Sexually Abused: No    FAMILY HISTORY: Family History  Problem Relation Age of Onset   Heart disease Mother    Hypertension Mother    Diabetes Father    Hypertension Father    Colon cancer Sister        passed away from colon cancer, in her 37s   Heart  attack Maternal Uncle    Prostate cancer Maternal Uncle    Diabetes Other    Pancreatic cancer Neg Hx    Rectal cancer Neg Hx    Esophageal cancer Neg Hx    Stomach cancer Neg Hx     ALLERGIES:  has No Known Allergies.  MEDICATIONS:  Current Outpatient Medications  Medication Sig Dispense Refill   acyclovir (ZOVIRAX) 400 MG tablet Take 1 tablet by mouth twice daily 60 tablet 0   allopurinol (ZYLOPRIM) 300 MG tablet Take 1 tablet by mouth once daily 30 tablet 3   aspirin EC 81 MG tablet Take 81 mg by mouth daily. Swallow whole.     cetirizine (ZYRTEC) 10 MG tablet Take 10 mg by mouth daily as needed for allergies.     chlorhexidine (PERIDEX) 0.12 % solution Swish 5 ml for 1 minute and spit out two times a day     clonazePAM (KLONOPIN) 0.5 MG tablet Take 1 tablet (0.5 mg total) by mouth 2 (two) times daily as needed for anxiety. 15 tablet 0   diazepam (VALIUM) 5 MG tablet Take 5 mg by mouth 2 (two) times daily as needed.     docusate sodium (COLACE) 100 MG capsule Take 1 capsule (100 mg total) by mouth 2 (two) times daily as needed for mild constipation. 30 capsule 3   DULoxetine (CYMBALTA) 20 MG capsule Take 1 capsule by mouth once daily 90 capsule 1   gabapentin (NEURONTIN) 100 MG capsule Take 1 capsule by mouth twice daily 60 capsule 0   hydrochlorothiazide (HYDRODIURIL) 25 MG tablet TAKE 1 TABLET BY MOUTH ONCE DAILY . APPOINTMENT REQUIRED FOR FUTURE REFILLS 38 tablet 0   lisinopril (ZESTRIL) 40 MG tablet Take 1 tablet by mouth once daily 90 tablet 0   meloxicam (MOBIC) 15 MG tablet Take 1 tablet by mouth daily.     Methocarbamol 1000 MG TABS Take 1,000 mg by mouth every 6 (six) hours as needed. (Patient taking differently: Take 1,000 mg by mouth every 6 (six) hours as needed (muscle spasms).) 45 tablet 1   metoprolol tartrate (LOPRESSOR) 50 MG tablet 50 mg by oral route.     morphine (  MS CONTIN) 30 MG 12 hr tablet Take 1 tablet (30 mg total) by mouth every 12 (twelve) hours. 60 tablet  0   Na Sulfate-K Sulfate-Mg Sulf 17.5-3.13-1.6 GM/177ML SOLN SMARTSIG:1 Kit(s) By Mouth Once     naloxone (NARCAN) nasal spray 4 mg/0.1 mL Call 911. Administer a single spray in one nostril. If no or minimal response after 2 to 3 minutes, an additional dose may be given in the alternate nostril.     nirmatrelvir & ritonavir (PAXLOVID, 300/100,) 20 x 150 MG & 10 x 100MG  TBPK TAKE 3 TABLETS TOGETHER (TWO 150 MG NIRMATRELVIR TABLETS AND ONE 100 MG RITONAVIR TABLET) BY MOUTH TWICE DAILY FOR 5 DAYS.     ondansetron (ZOFRAN) 8 MG tablet Take 1 tablet (8 mg total) by mouth every 8 (eight) hours as needed for nausea or vomiting. 60 tablet 3   pantoprazole (PROTONIX) 40 MG tablet Take 1 tablet by mouth twice daily 60 tablet 6   polyethylene glycol powder (GLYCOLAX/MIRALAX) 17 GM/SCOOP powder Take 1 capful (17 g) with water by mouth daily. (Patient taking differently: Take 17 g by mouth daily as needed for mild constipation.) 238 g 0   predniSONE (DELTASONE) 10 MG tablet See Admin Instructions.     prochlorperazine (COMPAZINE) 10 MG tablet Take 1 tablet (10 mg total) by mouth every 6 (six) hours as needed for nausea or vomiting. 60 tablet 0   REVLIMID 25 MG capsule TAKE 1 CAPSULE BY MOUTH DAILY FOR 14 DAYS, THEN NONE FOR 7 DAYS. 14 capsule 0   rosuvastatin (CRESTOR) 10 MG tablet Take 1 tablet (10 mg total) by mouth daily. 90 tablet 1   sildenafil (VIAGRA) 50 MG tablet Take 1 tablet (50 mg total) by mouth daily as needed for erectile dysfunction. 10 tablet 0   No current facility-administered medications for this visit.    REVIEW OF SYSTEMS:   Constitutional: ( - ) fevers, ( - )  chills , ( - ) night sweats Eyes: ( - ) blurriness of vision, ( - ) double vision, ( - ) watery eyes Ears, nose, mouth, throat, and face: ( - ) mucositis, ( - ) sore throat Respiratory: ( - ) cough, ( - ) dyspnea, ( - ) wheezes Cardiovascular: ( - ) palpitation, ( - ) chest discomfort, ( - ) lower extremity  swelling Gastrointestinal:  ( - ) nausea, ( +) heartburn, ( - ) change in bowel habits Skin: ( - ) abnormal skin rashes Lymphatics: ( - ) new lymphadenopathy, ( - ) easy bruising Neurological: ( - ) numbness, ( - ) tingling, ( - ) new weaknesses Behavioral/Psych: ( - ) mood change, ( - ) new changes  All other systems were reviewed with the patient and are negative.  PHYSICAL EXAMINATION: ECOG PERFORMANCE STATUS: 1 - Symptomatic but completely ambulatory  Vitals:   03/09/23 1037  BP: (!) 141/93  Pulse: 91  Resp: 16  Temp: 98.2 F (36.8 C)  SpO2: 97%     Filed Weights   03/09/23 1037  Weight: 243 lb 8 oz (110.5 kg)       GENERAL: Well-appearing young African-American male, alert, no distress and comfortable SKIN: skin color, texture, turgor are normal, no rashes or significant lesions EYES: conjunctiva are pink and non-injected, sclera clear. LUNGS: clear to auscultation and percussion with normal breathing effort HEART: regular rate & rhythm and no murmurs and no lower extremity edema Musculoskeletal: no cyanosis of digits and no clubbing  PSYCH: alert & oriented x  3, fluent speech NEURO: no focal motor/sensory deficits  LABORATORY DATA:  I have reviewed the data as listed    Latest Ref Rng & Units 03/09/2023    9:43 AM 02/23/2023    9:09 AM 02/09/2023    8:59 AM  CBC  WBC 4.0 - 10.5 K/uL 5.4  6.4  7.0   Hemoglobin 13.0 - 17.0 g/dL 40.9  81.1  91.4   Hematocrit 39.0 - 52.0 % 38.6  36.9  36.2   Platelets 150 - 400 K/uL 306  218  269        Latest Ref Rng & Units 03/09/2023    9:43 AM 02/23/2023    9:09 AM 02/09/2023    8:59 AM  CMP  Glucose 70 - 99 mg/dL 782  956  213   BUN 6 - 20 mg/dL 6  10  12    Creatinine 0.61 - 1.24 mg/dL 0.86  5.78  4.69   Sodium 135 - 145 mmol/L 136  134  134   Potassium 3.5 - 5.1 mmol/L 4.2  3.8  4.2   Chloride 98 - 111 mmol/L 104  100  101   CO2 22 - 32 mmol/L 25  26  25    Calcium 8.9 - 10.3 mg/dL 9.7  9.3  9.0   Total Protein  6.5 - 8.1 g/dL 7.1  6.9  6.6   Total Bilirubin 0.3 - 1.2 mg/dL 0.4  0.6  0.3   Alkaline Phos 38 - 126 U/L 60  56  58   AST 15 - 41 U/L 20  16  16    ALT 0 - 44 U/L 23  20  17      Lab Results  Component Value Date   MPROTEIN 0.1 (H) 02/23/2023   MPROTEIN 0.1 (H) 01/28/2023   MPROTEIN 0.2 (H) 12/29/2022   Lab Results  Component Value Date   KPAFRELGTCHN 10.0 02/23/2023   KPAFRELGTCHN 9.8 01/28/2023   KPAFRELGTCHN 11.2 12/29/2022   LAMBDASER 6.2 02/23/2023   LAMBDASER 7.1 01/28/2023   LAMBDASER 7.3 12/29/2022   KAPLAMBRATIO 1.61 02/23/2023   KAPLAMBRATIO 1.38 01/28/2023   KAPLAMBRATIO 1.53 12/29/2022   RADIOGRAPHIC STUDIES: No results found.  ASSESSMENT & PLAN [name] 52 y.o. male with medical history significant for IgG kappa multiple myeloma who presents for a follow up visit.   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 -- Recommend VRD chemotherapy with intention of proceeding to transplant. -- Labs at each visit to include CBC, CMP, LDH with monthly restaging labs SPEP and serum free light chains -- Started VRD therapy on 02/26/2022. Held Velcade therapy on 09/24/2022 due to development of ocular toxicity. --Switched to Dara/Rev/Dex on 10/02/2022 PLAN: --Due for Cycle 6, Day 15 of Dara/Rev/Dex.  --Labs today reviewed and adequate for treatment. WBC 5.4, hemoglobin 12.8, MCV 88.1, and platelets of 306 --Most recent myeloma labs from 02/22/2022 detected M protein at 0.1 with normalized serum free light chain and ratio. --Proceed with treatment today without any dose modifications.  -- Patient has made contact with the bone marrow transplant unit of [facility].  Currently undergoing evaluation there.  Holding Xgeva per their request. --RTC for weekly treatments with q 2 week toxicity checks.   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 --prescribed Viagra 50 mg p.o. as needed x 10 pills.  --Patient notes he  is not getting the results he would like, encouraged him to double the dose to Viagra 100 mg p.o. to see if that is more effective. -- If ineffective could consider Cialis.  # Leg Discomfort/Restless Leg -- Patient provided with Requip by the emergency department, discontinued due to poor efficacy. --Evaluated by Dr. Barbaraann Cao on 07/28/2022. Recommended to short course of steroids so started prednisone 50 mg daily x 7 days. --Discussed etiologies including iron deficiency so iron levels were checked and was normal.   #Constipation--improving: --Recommend to continue on colace but add miralax as well.   #Pathologic fractures-secondary to MM: --Involving L1, L2 and L3 compression fracture and left humerus fracture.  --Underwent kyphoplasty of L3 fracture on 02/19/2022 --Underwent medullary nailing of left humeral shaft on 02/22/2022 --Underwent kyphoplasty on 06/04/2022.   #Pain Medication -- Per palliative care patient has weaned off MS contin. Currently conitnues on gabapentin 100 mg BID, cymbalta 20 mg and robaxin as needed.  --Under the care of palliative care team.   #Supportive Care -- chemotherapy education complete -- port placement not required -- zofran 8mg  q8H PRN and compazine 10mg  PO q6H for nausea -- acyclovir 400mg  PO BID for VCZ prophylaxis -- allopurinol 300mg  PO daily for TLS prophylaxis -- Dental clearance for Zometa/Denosumab approved.  HOLDING per [facility] transplant request.   No orders of the defined types were placed in this encounter.   All questions were answered. The patient knows to call the clinic with any problems, questions or concerns.  I have spent a total of 30 minutes minutes of face-to-face and non-face-to-face time, preparing to see the patient,performing a medically appropriate examination, counseling and educating the patient,  documenting clinical information in the electronic health record,  and care coordination.  Ulysees Barns, MD Department of Hematology/Oncology [facility] at [facility] Phone: [phone] Pager: [phone] Email: [email or web address]

## 2023-03-09 ENCOUNTER — Inpatient Hospital Stay: Payer: BLUE CROSS/BLUE SHIELD | Attending: Physician Assistant

## 2023-03-09 ENCOUNTER — Encounter: Payer: Self-pay | Admitting: Nurse Practitioner

## 2023-03-09 ENCOUNTER — Inpatient Hospital Stay (HOSPITAL_BASED_OUTPATIENT_CLINIC_OR_DEPARTMENT_OTHER): Payer: BLUE CROSS/BLUE SHIELD | Admitting: Nurse Practitioner

## 2023-03-09 ENCOUNTER — Inpatient Hospital Stay: Payer: BLUE CROSS/BLUE SHIELD

## 2023-03-09 ENCOUNTER — Inpatient Hospital Stay (HOSPITAL_BASED_OUTPATIENT_CLINIC_OR_DEPARTMENT_OTHER): Payer: BLUE CROSS/BLUE SHIELD | Admitting: Hematology and Oncology

## 2023-03-09 VITALS — BP 141/93 | HR 91 | Temp 98.2°F | Resp 16 | Wt 243.5 lb

## 2023-03-09 DIAGNOSIS — Z87891 Personal history of nicotine dependence: Secondary | ICD-10-CM | POA: Diagnosis not present

## 2023-03-09 DIAGNOSIS — C9000 Multiple myeloma not having achieved remission: Secondary | ICD-10-CM

## 2023-03-09 DIAGNOSIS — G4709 Other insomnia: Secondary | ICD-10-CM | POA: Diagnosis not present

## 2023-03-09 DIAGNOSIS — G2581 Restless legs syndrome: Secondary | ICD-10-CM | POA: Diagnosis not present

## 2023-03-09 DIAGNOSIS — Z515 Encounter for palliative care: Secondary | ICD-10-CM

## 2023-03-09 DIAGNOSIS — K5900 Constipation, unspecified: Secondary | ICD-10-CM

## 2023-03-09 DIAGNOSIS — R110 Nausea: Secondary | ICD-10-CM | POA: Insufficient documentation

## 2023-03-09 DIAGNOSIS — Z7189 Other specified counseling: Secondary | ICD-10-CM

## 2023-03-09 DIAGNOSIS — N529 Male erectile dysfunction, unspecified: Secondary | ICD-10-CM | POA: Insufficient documentation

## 2023-03-09 DIAGNOSIS — G4700 Insomnia, unspecified: Secondary | ICD-10-CM | POA: Diagnosis not present

## 2023-03-09 DIAGNOSIS — G893 Neoplasm related pain (acute) (chronic): Secondary | ICD-10-CM

## 2023-03-09 DIAGNOSIS — Z5112 Encounter for antineoplastic immunotherapy: Secondary | ICD-10-CM | POA: Insufficient documentation

## 2023-03-09 LAB — CBC WITH DIFFERENTIAL (CANCER CENTER ONLY)
Abs Immature Granulocytes: 0.04 10*3/uL (ref 0.00–0.07)
Basophils Absolute: 0 10*3/uL (ref 0.0–0.1)
Basophils Relative: 1 %
Eosinophils Absolute: 0.1 10*3/uL (ref 0.0–0.5)
Eosinophils Relative: 2 %
HCT: 38.6 % — ABNORMAL LOW (ref 39.0–52.0)
Hemoglobin: 12.8 g/dL — ABNORMAL LOW (ref 13.0–17.0)
Immature Granulocytes: 1 %
Lymphocytes Relative: 35 %
Lymphs Abs: 1.9 10*3/uL (ref 0.7–4.0)
MCH: 29.2 pg (ref 26.0–34.0)
MCHC: 33.2 g/dL (ref 30.0–36.0)
MCV: 88.1 fL (ref 80.0–100.0)
Monocytes Absolute: 0.4 10*3/uL (ref 0.1–1.0)
Monocytes Relative: 7 %
Neutro Abs: 3 10*3/uL (ref 1.7–7.7)
Neutrophils Relative %: 54 %
Platelet Count: 306 10*3/uL (ref 150–400)
RBC: 4.38 MIL/uL (ref 4.22–5.81)
RDW: 13.6 % (ref 11.5–15.5)
WBC Count: 5.4 10*3/uL (ref 4.0–10.5)
nRBC: 0 % (ref 0.0–0.2)

## 2023-03-09 LAB — CMP (CANCER CENTER ONLY)
ALT: 23 U/L (ref 0–44)
AST: 20 U/L (ref 15–41)
Albumin: 4.3 g/dL (ref 3.5–5.0)
Alkaline Phosphatase: 60 U/L (ref 38–126)
Anion gap: 7 (ref 5–15)
BUN: 6 mg/dL (ref 6–20)
CO2: 25 mmol/L (ref 22–32)
Calcium: 9.7 mg/dL (ref 8.9–10.3)
Chloride: 104 mmol/L (ref 98–111)
Creatinine: 1.01 mg/dL (ref 0.61–1.24)
GFR, Estimated: >60 mL/min (ref >60)
Glucose, Bld: 101 mg/dL — ABNORMAL HIGH (ref 70–99)
Potassium: 4.2 mmol/L (ref 3.5–5.1)
Sodium: 136 mmol/L (ref 135–145)
Total Bilirubin: 0.4 mg/dL (ref 0.3–1.2)
Total Protein: 7.1 g/dL (ref 6.5–8.1)

## 2023-03-09 MED ORDER — DARATUMUMAB-HYALURONIDASE-FIHJ 1800-30000 MG-UT/15ML ~~LOC~~ SOLN
1800.0000 mg | Freq: Once | SUBCUTANEOUS | Status: AC
  Administered 2023-03-09: 1800 mg via SUBCUTANEOUS
  Filled 2023-03-09: qty 15

## 2023-03-09 MED ORDER — FAMOTIDINE 20 MG PO TABS
20.0000 mg | ORAL_TABLET | Freq: Once | ORAL | Status: AC
  Administered 2023-03-09: 20 mg via ORAL
  Filled 2023-03-09: qty 1

## 2023-03-09 MED ORDER — DIPHENHYDRAMINE HCL 25 MG PO CAPS
50.0000 mg | ORAL_CAPSULE | Freq: Once | ORAL | Status: AC
  Administered 2023-03-09: 50 mg via ORAL
  Filled 2023-03-09: qty 2

## 2023-03-09 MED ORDER — ACETAMINOPHEN 325 MG PO TABS
650.0000 mg | ORAL_TABLET | Freq: Once | ORAL | Status: AC
  Administered 2023-03-09: 650 mg via ORAL
  Filled 2023-03-09: qty 2

## 2023-03-09 MED ORDER — DEXAMETHASONE 4 MG PO TABS
40.0000 mg | ORAL_TABLET | Freq: Once | ORAL | Status: AC
  Administered 2023-03-09: 40 mg via ORAL
  Filled 2023-03-09: qty 10

## 2023-03-09 NOTE — Progress Notes (Unsigned)
Palliative Medicine [facility]  Telephone:([phone]) [phone] Fax:([phone]) [phone]   Name: [name] Date: 03/09/2023 MRN: [id number]  DOB: 05-16-1971  Patient Care Team: Christopher Skeans, MD as PCP - General (Family Medicine) Christopher Gess, MD as PCP - Cardiology (Cardiology) Christopher Gauss Gerrit Friends, MD as Attending Physician (Gastroenterology)   INTERVAL HISTORY: [name] is a 52 y.o. male with oncologic medical history including IgG Kappa Multiple Myeloma, L1, L2, and L3 vertebral compression fraction s/p kyphoplasty(9/27) and IM nailing of left humeral shaft (9/30), currently undergoing VRD therapy.  Palliative ask to see for symptom management.   SOCIAL HISTORY:     reports that he quit smoking about 14 months ago. His smoking use included cigarettes. He started smoking about 11 years ago. He has a 2.5 pack-year smoking history. He has never used smokeless tobacco. He reports current alcohol use. He reports that he does not use drugs.  ADVANCE DIRECTIVES:    CODE STATUS:   PAST MEDICAL HISTORY: Past Medical History:  Diagnosis Date   Allergy    Anemia    Atypical chest pain    Cancer (HCC)    Multiple myeloma with normocytic anemia   GERD (gastroesophageal reflux disease)    Hepatic cyst    Benign by MRI   Hiatal hernia    History of colonic polyps    Hyperlipidemia    Hypertension    Rectal bleeding    Tobacco abuse     ALLERGIES:  has No Known Allergies.  MEDICATIONS:  Current Outpatient Medications  Medication Sig Dispense Refill   acyclovir (ZOVIRAX) 400 MG tablet Take 1 tablet by mouth twice daily 60 tablet 0   allopurinol (ZYLOPRIM) 300 MG tablet Take 1 tablet by mouth once daily 30 tablet 3   aspirin EC 81 MG tablet Take 81 mg by mouth daily. Swallow whole.     cetirizine (ZYRTEC) 10 MG tablet Take 10 mg by mouth daily as needed for allergies.     chlorhexidine (PERIDEX) 0.12 % solution Swish 5 ml for 1 minute and spit out two  times a day     clonazePAM (KLONOPIN) 0.5 MG tablet Take 1 tablet (0.5 mg total) by mouth 2 (two) times daily as needed for anxiety. 15 tablet 0   diazepam (VALIUM) 5 MG tablet Take 5 mg by mouth 2 (two) times daily as needed.     docusate sodium (COLACE) 100 MG capsule Take 1 capsule (100 mg total) by mouth 2 (two) times daily as needed for mild constipation. 30 capsule 3   DULoxetine (CYMBALTA) 20 MG capsule Take 1 capsule by mouth once daily 90 capsule 1   gabapentin (NEURONTIN) 100 MG capsule Take 1 capsule by mouth twice daily 60 capsule 0   hydrochlorothiazide (HYDRODIURIL) 25 MG tablet TAKE 1 TABLET BY MOUTH ONCE DAILY . APPOINTMENT REQUIRED FOR FUTURE REFILLS 38 tablet 0   lisinopril (ZESTRIL) 20 MG tablet Take 1 tablet by mouth daily.     lisinopril (ZESTRIL) 40 MG tablet Take 1 tablet by mouth once daily 90 tablet 0   meloxicam (MOBIC) 15 MG tablet Take 1 tablet by mouth daily.     Methocarbamol 1000 MG TABS Take 1,000 mg by mouth every 6 (six) hours as needed. (Patient taking differently: Take 1,000 mg by mouth every 6 (six) hours as needed (muscle spasms).) 45 tablet 1   metoprolol tartrate (LOPRESSOR) 50 MG tablet 50 mg by oral route.     morphine (MS  CONTIN) 30 MG 12 hr tablet Take 1 tablet (30 mg total) by mouth every 12 (twelve) hours. 60 tablet 0   Na Sulfate-K Sulfate-Mg Sulf 17.5-3.13-1.6 GM/177ML SOLN SMARTSIG:1 Kit(s) By Mouth Once     naloxone (NARCAN) nasal spray 4 mg/0.1 mL Call 911. Administer a single spray in one nostril. If no or minimal response after 2 to 3 minutes, an additional dose may be given in the alternate nostril.     nirmatrelvir & ritonavir (PAXLOVID, 300/100,) 20 x 150 MG & 10 x 100MG  TBPK TAKE 3 TABLETS TOGETHER (TWO 150 MG NIRMATRELVIR TABLETS AND ONE 100 MG RITONAVIR TABLET) BY MOUTH TWICE DAILY FOR 5 DAYS.     ondansetron (ZOFRAN) 8 MG tablet Take 1 tablet (8 mg total) by mouth every 8 (eight) hours as needed for nausea or vomiting. 60 tablet 3    pantoprazole (PROTONIX) 40 MG tablet Take 1 tablet by mouth twice daily 60 tablet 6   polyethylene glycol powder (GLYCOLAX/MIRALAX) 17 GM/SCOOP powder Take 1 capful (17 g) with water by mouth daily. (Patient taking differently: Take 17 g by mouth daily as needed for mild constipation.) 238 g 0   predniSONE (DELTASONE) 10 MG tablet See Admin Instructions.     prochlorperazine (COMPAZINE) 10 MG tablet Take 1 tablet (10 mg total) by mouth every 6 (six) hours as needed for nausea or vomiting. 60 tablet 0   REVLIMID 25 MG capsule TAKE 1 CAPSULE BY MOUTH DAILY FOR 14 DAYS, THEN NONE FOR 7 DAYS. 14 capsule 0   rosuvastatin (CRESTOR) 10 MG tablet Take 1 tablet (10 mg total) by mouth daily. 90 tablet 1   rosuvastatin (CRESTOR) 10 MG tablet Take 1 tablet by mouth daily.     sildenafil (VIAGRA) 50 MG tablet Take 1 tablet (50 mg total) by mouth daily as needed for erectile dysfunction. 10 tablet 0   No current facility-administered medications for this visit.    VITAL SIGNS: There were no vitals taken for this visit. There were no vitals filed for this visit.  Estimated body mass index is 36.06 kg/m as calculated from the following:   Height as of 01/12/23: 5\' 9"  (1.753 m).   Weight as of 02/23/23: 244 lb 3.2 oz (110.8 kg).   PERFORMANCE STATUS (ECOG) : 1 - Symptomatic but completely ambulatory  Assessment NAD, ambulatory RRR Normal breathing pattern AAO x4  IMPRESSION: [name] presents to clinic for follow-up. No acute distress. He is taking things one day at a time. Remaining active. Denies nausea, vomiting, constipation, or diarrhea. Complains of ongoing insomnia and occasional anxiety.   Neoplasm related pain [name] reports pain continues to be well-controlled. Pain is in his back. Worsens with increased activity.   MS Contin 30 mg twice daily.  Continues with Gabapentin 100 mg twice daily,  Taking medications as prescribed. Does not require breakthrough medication. Will continue to  follow. Wean medications as appropriate.   Nausea/Indigestion Resolved. Appetite is doing good. Weight stable at 245lbs.   Constipation Resolved with current regimen  Overall [name] is much improved.  Appreciative of where he is in his health journey.  He knows to contact our office as needed with any concerns.  PLAN:  MS Contin 30 mg daily Gabapentin 100 mg twice daily Klonopin daily as needed for anxiety/sleep Zofran and Compazine as needed for nausea Miralax daily for constipation I will plan to see patient back in 6-8 weeks in collaboration with other oncology appointments.  Patient knows to contact office sooner if  needed.   Patient expressed understanding and was in agreement with this plan. He also understands that He can call the clinic at any time with any questions, concerns, or complaints.    Any controlled substances utilized were prescribed in the context of palliative care. PDMP has been reviewed.    Visit consisted of counseling and education dealing with the complex and emotionally intense issues of symptom management and palliative care in the setting of serious and potentially life-threatening illness.Greater than 50%  of this time was spent counseling and coordinating care related to the above assessment and plan.  Willette Alma, AGPCNP-BC  Palliative Medicine Team/[facility]  *Please note that this is a verbal dictation therefore any spelling or grammatical errors are due to the "Dragon [employer] One" system interpretation.

## 2023-03-09 NOTE — Patient Instructions (Signed)
[facility]  Discharge Instructions: Thank you for choosing [facility] to provide your oncology and hematology care.   If you have a lab appointment with the Cancer Center, please go directly to the Cancer Center and check in at the registration area.   Wear comfortable clothing and clothing appropriate for easy access to any Portacath or PICC line.   We strive to give you quality time with your provider. You may need to reschedule your appointment if you arrive late (15 or more minutes).  Arriving late affects you and other patients whose appointments are after yours.  Also, if you miss three or more appointments without notifying the office, you may be dismissed from the clinic at the provider's discretion.      For prescription refill requests, have your pharmacy contact our office and allow 72 hours for refills to be completed.    Today you received the following chemotherapy and/or immunotherapy agents: Dara Faspro.    To help prevent nausea and vomiting after your treatment, we encourage you to take your nausea medication as directed.  BELOW ARE SYMPTOMS THAT SHOULD BE REPORTED IMMEDIATELY: *FEVER GREATER THAN 100.4 F (38 C) OR HIGHER *CHILLS OR SWEATING *NAUSEA AND VOMITING THAT IS NOT CONTROLLED WITH YOUR NAUSEA MEDICATION *UNUSUAL SHORTNESS OF BREATH *UNUSUAL BRUISING OR BLEEDING *URINARY PROBLEMS (pain or burning when urinating, or frequent urination) *BOWEL PROBLEMS (unusual diarrhea, constipation, pain near the anus) TENDERNESS IN MOUTH AND THROAT WITH OR WITHOUT PRESENCE OF ULCERS (sore throat, sores in mouth, or a toothache) UNUSUAL RASH, SWELLING OR PAIN  UNUSUAL VAGINAL DISCHARGE OR ITCHING   Items with * indicate a potential emergency and should be followed up as soon as possible or go to the Emergency Department if any problems should occur.  Please show the CHEMOTHERAPY ALERT CARD or IMMUNOTHERAPY ALERT CARD at  check-in to the Emergency Department and triage nurse.  Should you have questions after your visit or need to cancel or reschedule your appointment, please contact [facility]  Dept: [phone]  and follow the prompts.  Office hours are 8:00 a.m. to 4:30 p.m. Monday - Friday. Please note that voicemails left after 4:00 p.m. may not be returned until the following business day.  We are closed weekends and major holidays. You have access to a nurse at all times for urgent questions. Please call the main number to the clinic Dept: [phone] and follow the prompts.   For any non-urgent questions, you may also contact your provider using MyChart. We now offer e-Visits for anyone 27 and older to request care online for non-urgent symptoms. For details visit [email or web address].   Also download the MyChart app! Go to the app store, search "MyChart", open the app, select , and log in with your MyChart username and password.

## 2023-03-10 ENCOUNTER — Encounter: Payer: Self-pay | Admitting: Hematology and Oncology

## 2023-03-10 ENCOUNTER — Other Ambulatory Visit: Payer: Self-pay

## 2023-03-10 ENCOUNTER — Encounter: Payer: Self-pay | Admitting: Hematology

## 2023-03-10 ENCOUNTER — Other Ambulatory Visit: Payer: Self-pay | Admitting: Hematology and Oncology

## 2023-03-10 DIAGNOSIS — C9000 Multiple myeloma not having achieved remission: Secondary | ICD-10-CM

## 2023-03-10 MED ORDER — CLONAZEPAM 0.5 MG PO TABS: 0.5000 mg | ORAL_TABLET | Freq: Two times a day (BID) | ORAL | 0 refills | Status: DC | PRN

## 2023-03-10 MED ORDER — REVLIMID 25 MG PO CAPS: 25.0000 mg | ORAL_CAPSULE | Freq: Every day | ORAL | 0 refills | Status: DC

## 2023-03-12 ENCOUNTER — Encounter: Payer: Self-pay | Admitting: Orthopedic Surgery

## 2023-03-13 ENCOUNTER — Telehealth: Payer: Self-pay

## 2023-03-13 NOTE — Telephone Encounter (Signed)
Spoke with [facility] stating that they would need PA in order for pt to take without taking a 7 day break. This RN called pharmacy back to inform them that pt is to take medication 14 days on and 7 days off. Pharmacy representative stated that they would relay the message and be sure that the pharmacy admin note is properly updated.

## 2023-03-18 ENCOUNTER — Other Ambulatory Visit: Payer: Self-pay | Admitting: Hematology and Oncology

## 2023-03-21 ENCOUNTER — Other Ambulatory Visit: Payer: BLUE CROSS/BLUE SHIELD

## 2023-03-23 ENCOUNTER — Inpatient Hospital Stay: Payer: BLUE CROSS/BLUE SHIELD

## 2023-03-23 ENCOUNTER — Inpatient Hospital Stay (HOSPITAL_BASED_OUTPATIENT_CLINIC_OR_DEPARTMENT_OTHER): Payer: BLUE CROSS/BLUE SHIELD | Admitting: Hematology and Oncology

## 2023-03-23 ENCOUNTER — Other Ambulatory Visit: Payer: Self-pay | Admitting: Family Medicine

## 2023-03-23 VITALS — BP 125/85 | HR 89 | Temp 98.4°F | Resp 16 | Wt 241.2 lb

## 2023-03-23 DIAGNOSIS — Z5112 Encounter for antineoplastic immunotherapy: Secondary | ICD-10-CM | POA: Diagnosis not present

## 2023-03-23 DIAGNOSIS — Z7189 Other specified counseling: Secondary | ICD-10-CM | POA: Diagnosis not present

## 2023-03-23 DIAGNOSIS — C9000 Multiple myeloma not having achieved remission: Secondary | ICD-10-CM | POA: Diagnosis not present

## 2023-03-23 LAB — CMP (CANCER CENTER ONLY)
ALT: 18 U/L (ref 0–44)
AST: 15 U/L (ref 15–41)
Albumin: 4.3 g/dL (ref 3.5–5.0)
Alkaline Phosphatase: 56 U/L (ref 38–126)
Anion gap: 6 (ref 5–15)
BUN: 10 mg/dL (ref 6–20)
CO2: 25 mmol/L (ref 22–32)
Calcium: 9.3 mg/dL (ref 8.9–10.3)
Chloride: 107 mmol/L (ref 98–111)
Creatinine: 0.94 mg/dL (ref 0.61–1.24)
GFR, Estimated: >60 mL/min (ref >60)
Glucose, Bld: 110 mg/dL — ABNORMAL HIGH (ref 70–99)
Potassium: 4.2 mmol/L (ref 3.5–5.1)
Sodium: 138 mmol/L (ref 135–145)
Total Bilirubin: 0.5 mg/dL (ref 0.3–1.2)
Total Protein: 7 g/dL (ref 6.5–8.1)

## 2023-03-23 LAB — CBC WITH DIFFERENTIAL (CANCER CENTER ONLY)
Abs Immature Granulocytes: 0.03 10*3/uL (ref 0.00–0.07)
Basophils Absolute: 0 10*3/uL (ref 0.0–0.1)
Basophils Relative: 1 %
Eosinophils Absolute: 0 10*3/uL (ref 0.0–0.5)
Eosinophils Relative: 1 %
HCT: 39.4 % (ref 39.0–52.0)
Hemoglobin: 12.9 g/dL — ABNORMAL LOW (ref 13.0–17.0)
Immature Granulocytes: 1 %
Lymphocytes Relative: 35 %
Lymphs Abs: 2.2 10*3/uL (ref 0.7–4.0)
MCH: 29 pg (ref 26.0–34.0)
MCHC: 32.7 g/dL (ref 30.0–36.0)
MCV: 88.5 fL (ref 80.0–100.0)
Monocytes Absolute: 0.7 10*3/uL (ref 0.1–1.0)
Monocytes Relative: 10 %
Neutro Abs: 3.4 10*3/uL (ref 1.7–7.7)
Neutrophils Relative %: 52 %
Platelet Count: 209 10*3/uL (ref 150–400)
RBC: 4.45 MIL/uL (ref 4.22–5.81)
RDW: 13.4 % (ref 11.5–15.5)
WBC Count: 6.4 10*3/uL (ref 4.0–10.5)
nRBC: 0 % (ref 0.0–0.2)

## 2023-03-23 MED ORDER — DARATUMUMAB-HYALURONIDASE-FIHJ 1800-30000 MG-UT/15ML ~~LOC~~ SOLN
1800.0000 mg | Freq: Once | SUBCUTANEOUS | Status: AC
  Administered 2023-03-23: 1800 mg via SUBCUTANEOUS
  Filled 2023-03-23: qty 15

## 2023-03-23 MED ORDER — ACETAMINOPHEN 325 MG PO TABS
650.0000 mg | ORAL_TABLET | Freq: Once | ORAL | Status: AC
  Administered 2023-03-23: 650 mg via ORAL
  Filled 2023-03-23: qty 2

## 2023-03-23 MED ORDER — FAMOTIDINE 20 MG PO TABS
20.0000 mg | ORAL_TABLET | Freq: Once | ORAL | Status: AC
  Administered 2023-03-23: 20 mg via ORAL
  Filled 2023-03-23: qty 1

## 2023-03-23 MED ORDER — DEXAMETHASONE 4 MG PO TABS
40.0000 mg | ORAL_TABLET | Freq: Once | ORAL | Status: AC
  Administered 2023-03-23: 40 mg via ORAL
  Filled 2023-03-23: qty 10

## 2023-03-23 MED ORDER — DIPHENHYDRAMINE HCL 25 MG PO CAPS
50.0000 mg | ORAL_CAPSULE | Freq: Once | ORAL | Status: AC
  Administered 2023-03-23: 50 mg via ORAL
  Filled 2023-03-23: qty 2

## 2023-03-23 NOTE — Patient Instructions (Signed)

## 2023-03-23 NOTE — Progress Notes (Unsigned)
[facility] [phone]   [phone]  PROGRESS NOTE  Patient Care Team: Georganna Skeans, MD as PCP - General (Family Medicine) Runell Gess, MD as PCP - Cardiology (Cardiology) Jena Gauss Gerrit Friends, MD as Attending Physician (Gastroenterology)  Hematological/Oncological History # IgG Kappa Multiple Myeloma, 1p32/1q21.  01/04/2022: MRI lumbar spine shows diffuse heterogeneous marrow signal with heterogeneous enhancement throughout the thoracolumbar spine.  Additionally, there is subacute-chronic L3 vertebral body compression fraction. 01/14/2022: establish care with Dr. Leonides Schanz  02/10/2022: bone marrow biopsy shows range of plasma cell involvement from 50% on the biopsy to 60% on aspirate smear slides and close to 90% on the clot section for an overall percentage estimated at approximately 80% overall. 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  10/02/2022: Cycle 1 Day 1 of Dara/Rev/Dex 11/03/2022: Cycle 2 Day 1 of Dara/Rev/Dex 11/30/2022:  Cycle 3 Day 1 of Dara/Rev/Dex 12/29/2022: Cycle 4 Day 1 of Dara/Rev/Dex 01/26/2023: Cycle 5 Day 1 of Dara/Rev/Dex 02/23/2023: Cycle 6 Day 1 of Dara/Rev/Dex 03/23/2023: Cycle 7 Day 1 of Dara/Rev/Dex  Interval History:  [name] 52 y.o. male for continued management of IgG kappa multiple myeloma. The patient's last visit was on 03/09/2023. In the interim, he continues on dara/rev/dex. He presents today for Cycle  7, Day 1 of treatment.   On exam today [name] reports he is completed his dental work and will be reaching out to [facility] today to discuss further.  He is not having any pain or discomfort with his teeth.  He is having some occasional bouts of diarrhea and is trying to eat bananas to help with this.  He notes it is mostly watery liquid.  He has not purchased Imodium yet to try to control this.  His appetite has been good and he is eating twice per day.  He reports he is feeling somewhat fatigued and tired but is not having any nausea or vomiting.  He is tolerating his treatment well and is not having any other major side effects.  Otherwise that he has been at his baseline level of health and is willing and able to proceed with treatment at this time.  He denies fevers, chills, sweats, shortness of breath, chest pain.  A full 10 point ROS was otherwise negative.  MEDICAL HISTORY:  Past Medical History:  Diagnosis Date   Allergy    Anemia    Atypical chest pain    Cancer (HCC)    Multiple myeloma with normocytic anemia   GERD (gastroesophageal reflux disease)    Hepatic cyst    Benign by MRI   Hiatal hernia    History of colonic polyps    Hyperlipidemia    Hypertension    Rectal bleeding    Tobacco abuse     SURGICAL HISTORY: Past Surgical History:  Procedure Laterality Date   COLONOSCOPY  01/10/2009     RMR: Normal rectum, normal colon; repeat in 2015 due to  FH of colon cancer   COLONOSCOPY N/A 01/09/2014   YQM:VHQIONGE colonic polyps-removed as described   COLONOSCOPY N/A 09/18/2016   Procedure: COLONOSCOPY;  Surgeon: Corbin Ade, MD;  [facility];  Service: Endoscopy;  Laterality: N/A;  730    ESOPHAGOGASTRODUODENOSCOPY  10/04/2007   RMR: Distal esophageal erosions consistent with erosive reflux esophagitis, patulous gastroesophageal junction status post passage of a  Maloney dilator, 56 [place].  Otherwise, unremarkable esophagus.  Hiatal hernia.  Otherwise  normal stomach.  Bulbar erosion   ESOPHAGOGASTRODUODENOSCOPY N/A 01/09/2014   Erosive reflux esophagitis. Small hiatal hernia   HUMERUS IM NAIL Left 02/22/2022   Procedure: INTRAMEDULLARY (IM) NAIL HUMERAL;  Surgeon: Yolonda Kida, MD;  Location: [facility] OR;  Service: Orthopedics;  Laterality: Left;   I & D EXTREMITY Left 07/26/2018   Procedure: IRRIGATION AND DEBRIDEMENT EXTREMITY;  Surgeon: Knute Neu, MD;  Location: [facility] OR;  Service: Plastics;  Laterality: Left;   IR BONE TUMOR(S)RF ABLATION  02/05/2022   IR BONE TUMOR(S)RF ABLATION  02/24/2022   IR KYPHO EA ADDL LEVEL THORACIC OR LUMBAR  02/19/2022   IR KYPHO LUMBAR INC FX REDUCE BONE BX UNI/BIL CANNULATION INC/IMAGING  02/05/2022   IR KYPHO LUMBAR INC FX REDUCE BONE BX UNI/BIL CANNULATION INC/IMAGING  02/19/2022   PERCUTANEOUS PINNING Left 07/26/2018   Procedure: PERCUTANEOUS PINNING EXTREMITY;  Surgeon: Knute Neu, MD;  Location: [facility] OR;  Service: Plastics;  Laterality: Left;    SOCIAL HISTORY: Social History   Socioeconomic History   Marital status: Divorced    Spouse name: Not on file   Number of children: 2   Years of education: Not on file   Highest education level: Not on file  Occupational History   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[employer]    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[employer]  Tobacco Use   Smoking status: Former    Current packs/day: 0.00    Average packs/day: 0.3 packs/day for 10.0 years (2.5 ttl pk-yrs)    Types: Cigarettes    Start date: 12/25/2011    Quit date: 12/24/2021    Years since quitting: 1.2   Smokeless tobacco: Never  Vaping Use   Vaping status: Never Used  Substance and Sexual Activity   Alcohol use: Yes    Alcohol/week: 0.0 standard drinks of alcohol    Comment: occasional/wine on the weekends   Drug use: No   Sexual activity: Not on file  Other Topics Concern   Not on file  Social History Narrative   Right handed   Lives alone one story home   Social Determinants of Health    Financial Resource Strain: Low Risk  (02/13/2023)   Overall Financial Resource Strain (CARDIA)    Difficulty of Paying Living Expenses: Not very hard  Food Insecurity: No Food Insecurity (07/17/2022)   Hunger Vital Sign    Worried About Running Out of Food in the Last Year: Never true    Ran Out of Food in the Last Year: Never true  Transportation Needs: No Transportation Needs (07/17/2022)   PRAPARE - Transportation    Lack of Transportation (Medical): No    Lack of Transportation (Non-Medical): No  Physical Activity: Inactive (02/13/2023)   Exercise Vital Sign    Days of Exercise per Week: 0 days    Minutes of Exercise per Session: 0 min  Stress: No Stress Concern Present (02/13/2023)   [employer] of Occupational Health - Occupational Stress Questionnaire    Feeling of Stress :  Not at all  Social Connections: Unknown (02/13/2023)   Social Connection and Isolation Panel [NHANES]    Frequency of Communication with Friends and Family: More than three times a week    Frequency of Social Gatherings with Friends and Family: More than three times a week    Attends Religious Services: More than 4 times per year    Active Member of [employer] or Organizations: No    Attends Banker Meetings: Never    Marital Status: Not on file  Intimate Partner Violence: Not At Risk (07/17/2022)   Humiliation, Afraid, Rape, and Kick questionnaire    Fear of Current or Ex-Partner: No    Emotionally Abused: No    Physically Abused: No    Sexually Abused: No    FAMILY HISTORY: Family History  Problem Relation Age of Onset   Heart disease Mother    Hypertension Mother    Diabetes Father    Hypertension Father    Colon cancer Sister        passed away from colon cancer, in her 97s   Heart attack Maternal Uncle    Prostate cancer Maternal Uncle    Diabetes Other    Pancreatic cancer Neg Hx    Rectal cancer Neg Hx    Esophageal cancer Neg Hx    Stomach cancer Neg Hx     ALLERGIES:   has No Known Allergies.  MEDICATIONS:  Current Outpatient Medications  Medication Sig Dispense Refill   acyclovir (ZOVIRAX) 400 MG tablet Take 1 tablet by mouth twice daily 60 tablet 0   allopurinol (ZYLOPRIM) 300 MG tablet Take 1 tablet by mouth once daily 30 tablet 3   aspirin EC 81 MG tablet Take 81 mg by mouth daily. Swallow whole.     cetirizine (ZYRTEC) 10 MG tablet Take 10 mg by mouth daily as needed for allergies.     chlorhexidine (PERIDEX) 0.12 % solution Swish 5 ml for 1 minute and spit out two times a day     clonazePAM (KLONOPIN) 0.5 MG tablet Take 1 tablet (0.5 mg total) by mouth 2 (two) times daily as needed for anxiety. 20 tablet 0   docusate sodium (COLACE) 100 MG capsule Take 1 capsule (100 mg total) by mouth 2 (two) times daily as needed for mild constipation. 30 capsule 3   DULoxetine (CYMBALTA) 20 MG capsule Take 1 capsule by mouth once daily 90 capsule 1   gabapentin (NEURONTIN) 100 MG capsule Take 1 capsule by mouth twice daily 60 capsule 0   hydrochlorothiazide (HYDRODIURIL) 25 MG tablet Take 1 tablet (25 mg total) by mouth daily. 90 tablet 0   lisinopril (ZESTRIL) 40 MG tablet Take 1 tablet by mouth once daily 90 tablet 0   meloxicam (MOBIC) 15 MG tablet Take 1 tablet by mouth daily.     Methocarbamol 1000 MG TABS Take 1,000 mg by mouth every 6 (six) hours as needed. (Patient taking differently: Take 1,000 mg by mouth every 6 (six) hours as needed (muscle spasms).) 45 tablet 1   metoprolol tartrate (LOPRESSOR) 50 MG tablet 50 mg by oral route.     morphine (MS CONTIN) 30 MG 12 hr tablet Take 1 tablet (30 mg total) by mouth every 12 (twelve) hours. 60 tablet 0   Na Sulfate-K Sulfate-Mg Sulf 17.5-3.13-1.6 GM/177ML SOLN SMARTSIG:1 Kit(s) By Mouth Once     naloxone (NARCAN) nasal spray 4 mg/0.1 mL Call 911. Administer a single spray in one nostril. If no or minimal response after  2 to 3 minutes, an additional dose may be given in the alternate nostril.     nirmatrelvir &  ritonavir (PAXLOVID, 300/100,) 20 x 150 MG & 10 x 100MG  TBPK TAKE 3 TABLETS TOGETHER (TWO 150 MG NIRMATRELVIR TABLETS AND ONE 100 MG RITONAVIR TABLET) BY MOUTH TWICE DAILY FOR 5 DAYS.     ondansetron (ZOFRAN) 8 MG tablet Take 1 tablet (8 mg total) by mouth every 8 (eight) hours as needed for nausea or vomiting. 60 tablet 3   pantoprazole (PROTONIX) 40 MG tablet Take 1 tablet by mouth twice daily 60 tablet 6   polyethylene glycol powder (GLYCOLAX/MIRALAX) 17 GM/SCOOP powder Take 1 capful (17 g) with water by mouth daily. (Patient taking differently: Take 17 g by mouth daily as needed for mild constipation.) 238 g 0   predniSONE (DELTASONE) 10 MG tablet See Admin Instructions.     prochlorperazine (COMPAZINE) 10 MG tablet Take 1 tablet (10 mg total) by mouth every 6 (six) hours as needed for nausea or vomiting. 60 tablet 0   REVLIMID 25 MG capsule Take 1 capsule (25 mg total) by mouth daily. Siri Cole #[id number]   Date Obtained 03/10/2023 (Patient taking differently: Take 25 mg by mouth daily. Siri Cole #[id number]   Date Obtained 03/10/2023. Take for 14 days daily and 7 days off.) 14 capsule 0   rosuvastatin (CRESTOR) 10 MG tablet Take 1 tablet (10 mg total) by mouth daily. 90 tablet 1   sildenafil (VIAGRA) 50 MG tablet Take 1 tablet (50 mg total) by mouth daily as needed for erectile dysfunction. 10 tablet 0   No current facility-administered medications for this visit.    REVIEW OF SYSTEMS:   Constitutional: ( - ) fevers, ( - )  chills , ( - ) night sweats Eyes: ( - ) blurriness of vision, ( - ) double vision, ( - ) watery eyes Ears, nose, mouth, throat, and face: ( - ) mucositis, ( - ) sore throat Respiratory: ( - ) cough, ( - ) dyspnea, ( - ) wheezes Cardiovascular: ( - ) palpitation, ( - ) chest discomfort, ( - ) lower extremity swelling Gastrointestinal:  ( - ) nausea, ( +) heartburn, ( - ) change in bowel habits Skin: ( - ) abnormal skin rashes Lymphatics: ( - ) new lymphadenopathy, ( -  ) easy bruising Neurological: ( - ) numbness, ( - ) tingling, ( - ) new weaknesses Behavioral/Psych: ( - ) mood change, ( - ) new changes  All other systems were reviewed with the patient and are negative.  PHYSICAL EXAMINATION: ECOG PERFORMANCE STATUS: 1 - Symptomatic but completely ambulatory  Vitals:   03/23/23 1040  BP: 125/85  Pulse: 89  Resp: 16  Temp: 98.4 F (36.9 C)  SpO2: 98%      Filed Weights   03/23/23 1040  Weight: 241 lb 3.2 oz (109.4 kg)        GENERAL: Well-appearing young African-American male, alert, no distress and comfortable SKIN: skin color, texture, turgor are normal, no rashes or significant lesions EYES: conjunctiva are pink and non-injected, sclera clear. LUNGS: clear to auscultation and percussion with normal breathing effort HEART: regular rate & rhythm and no murmurs and no lower extremity edema Musculoskeletal: no cyanosis of digits and no clubbing  PSYCH: alert & oriented x 3, fluent speech NEURO: no focal motor/sensory deficits  LABORATORY DATA:  I have reviewed the data as listed    Latest Ref Rng & Units 03/23/2023   10:08 AM  03/09/2023    9:43 AM 02/23/2023    9:09 AM  CBC  WBC 4.0 - 10.5 K/uL 6.4  5.4  6.4   Hemoglobin 13.0 - 17.0 g/dL 01.7  51.0  25.8   Hematocrit 39.0 - 52.0 % 39.4  38.6  36.9   Platelets 150 - 400 K/uL 209  306  218        Latest Ref Rng & Units 03/23/2023   10:08 AM 03/09/2023    9:43 AM 02/23/2023    9:09 AM  CMP  Glucose 70 - 99 mg/dL 527  782  423   BUN 6 - 20 mg/dL 10  6  10    Creatinine 0.61 - 1.24 mg/dL 5.36  1.44  3.15   Sodium 135 - 145 mmol/L 138  136  134   Potassium 3.5 - 5.1 mmol/L 4.2  4.2  3.8   Chloride 98 - 111 mmol/L 107  104  100   CO2 22 - 32 mmol/L 25  25  26    Calcium 8.9 - 10.3 mg/dL 9.3  9.7  9.3   Total Protein 6.5 - 8.1 g/dL 7.0  7.1  6.9   Total Bilirubin 0.3 - 1.2 mg/dL 0.5  0.4  0.6   Alkaline Phos 38 - 126 U/L 56  60  56   AST 15 - 41 U/L 15  20  16    ALT 0 - 44  U/L 18  23  20      Lab Results  Component Value Date   MPROTEIN 0.1 (H) 02/23/2023   MPROTEIN 0.1 (H) 01/28/2023   MPROTEIN 0.2 (H) 12/29/2022   Lab Results  Component Value Date   KPAFRELGTCHN 10.0 02/23/2023   KPAFRELGTCHN 9.8 01/28/2023   KPAFRELGTCHN 11.2 12/29/2022   LAMBDASER 6.2 02/23/2023   LAMBDASER 7.1 01/28/2023   LAMBDASER 7.3 12/29/2022   KAPLAMBRATIO 1.61 02/23/2023   KAPLAMBRATIO 1.38 01/28/2023   KAPLAMBRATIO 1.53 12/29/2022   RADIOGRAPHIC STUDIES: No results found.  ASSESSMENT & PLAN [name] 52 y.o. male with medical history significant for IgG kappa multiple myeloma who presents for a follow up visit.   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 -- Recommend VRD chemotherapy with intention of proceeding to transplant. -- Labs at each visit to include CBC, CMP, LDH with monthly restaging labs SPEP and serum free light chains -- Started VRD therapy on 02/26/2022. Held Velcade therapy on 09/24/2022 due to development of ocular toxicity. --Switched to Dara/Rev/Dex on 10/02/2022 PLAN: --Due for Cycle 7, Day 1 of Dara/Rev/Dex.  --Labs today reviewed and adequate for treatment. WBC 6.4, hemoglobin 12.9, MCV 88.5, and platelets of 209 --Most recent myeloma labs from 02/22/2022 detected M protein at 0.1 with normalized serum free light chain and ratio. --Proceed with treatment today without any dose modifications.  -- Patient has made contact with the bone marrow transplant unit of [facility].  Currently undergoing evaluation there.  Holding Xgeva per their request. --RTC for weekly treatments with q 2 week toxicity checks.   #Erectile Dysfunction -- Likely secondary to chemotherapy. --prescribed Viagra 50 mg p.o. as needed x 10 pills.  --Patient notes he is not getting the results he would  like, encouraged him to double the dose to Viagra 100 mg p.o. to see if that is more effective. -- If ineffective could consider Cialis.  #  Leg Discomfort/Restless Leg -- Patient provided with Requip by the emergency department, discontinued due to poor efficacy. --Evaluated by Dr. Barbaraann Cao on 07/28/2022. Recommended to short course of steroids so started prednisone 50 mg daily x 7 days. --Discussed etiologies including iron deficiency so iron levels were checked and was normal.   #Constipation--improving: --Recommend to continue on colace but add miralax as well.   #Pathologic fractures-secondary to MM: --Involving L1, L2 and L3 compression fracture and left humerus fracture.  --Underwent kyphoplasty of L3 fracture on 02/19/2022 --Underwent medullary nailing of left humeral shaft on 02/22/2022 --Underwent kyphoplasty on 06/04/2022.   #Pain Medication -- Per palliative care patient has weaned off MS contin. Currently conitnues on gabapentin 100 mg BID, cymbalta 20 mg and robaxin as needed.  --Under the care of palliative care team.   #Supportive Care -- chemotherapy education complete -- port placement not required -- zofran 8mg  q8H PRN and compazine 10mg  PO q6H for nausea -- acyclovir 400mg  PO BID for VCZ prophylaxis -- allopurinol 300mg  PO daily for TLS prophylaxis -- Dental clearance for Zometa/Denosumab approved.  HOLDING per [facility] transplant request.   Orders Placed This Encounter  Procedures   IFE, Dara-Specific, Serum    Standing Status:   Future    Standing Expiration Date:   04/19/2024   CBC with Differential (Cancer Center Only)    Standing Status:   Future    Standing Expiration Date:   04/19/2024   Multiple Myeloma Panel (SPEP&IFE w/QIG)    Standing Status:   Future    Standing Expiration Date:   04/19/2024   Kappa/lambda light chains    Standing Status:   Future    Standing Expiration Date:   04/19/2024   CMP (Cancer Center only)    Standing Status:   Future     Standing Expiration Date:   04/19/2024    All questions were answered. The patient knows to call the clinic with any problems, questions or concerns.  I have spent a total of 30 minutes minutes of face-to-face and non-face-to-face time, preparing to see the patient,performing a medically appropriate examination, counseling and educating the patient, documenting clinical information in the electronic health record,  and care coordination.  Ulysees Barns, MD Department of Hematology/Oncology [facility] at [facility] Phone: [phone] Pager: [phone] Email: [email or web address]

## 2023-03-23 NOTE — Telephone Encounter (Signed)
Medication Refill - Medication: hydrochlorothiazide (HYDRODIURIL) 25 MG tablet  Has the patient contacted their pharmacy? Yes.    Preferred Pharmacy (with phone number or street name):   [id number] - [place] ([place]), [place] - [address]  [address], [place] ([place]) [place] [address]  Phone:  [phone]  Fax:  [phone]   Has the patient been seen for an appointment in the last year OR does the patient have an upcoming appointment? Yes.    Agent: Please be advised that RX refills may take up to 3 business days. We ask that you follow-up with your pharmacy.

## 2023-03-24 ENCOUNTER — Encounter: Payer: Self-pay | Admitting: Hematology and Oncology

## 2023-03-24 ENCOUNTER — Other Ambulatory Visit: Payer: Self-pay

## 2023-03-24 ENCOUNTER — Other Ambulatory Visit: Payer: Self-pay | Admitting: *Deleted

## 2023-03-24 ENCOUNTER — Encounter: Payer: Self-pay | Admitting: Hematology

## 2023-03-24 LAB — KAPPA/LAMBDA LIGHT CHAINS
Kappa free light chain: 7.9 mg/L (ref 3.3–19.4)
Kappa, lambda light chain ratio: 1.72 — ABNORMAL HIGH (ref 0.26–1.65)
Lambda free light chains: 4.6 mg/L — ABNORMAL LOW (ref 5.7–26.3)

## 2023-03-24 MED ORDER — HYDROCHLOROTHIAZIDE 25 MG PO TABS: 25.0000 mg | ORAL_TABLET | Freq: Every day | ORAL | 0 refills | Status: DC

## 2023-03-24 NOTE — Telephone Encounter (Signed)
Requested Prescriptions  Pending Prescriptions Disp Refills   hydrochlorothiazide (HYDRODIURIL) 25 MG tablet 90 tablet 0    Sig: Take 1 tablet (25 mg total) by mouth daily.     Cardiovascular: Diuretics - Thiazide Passed - 03/23/2023  2:37 PM      Passed - Cr in normal range and within 180 days    Creatinine  Date Value Ref Range Status  03/23/2023 0.94 0.61 - 1.24 mg/dL Final   Creat  Date Value Ref Range Status  06/01/2012 1.23 0.50 - 1.35 mg/dL Final   Creatinine, Urine  Date Value Ref Range Status  02/20/2022 117 mg/dL Final    Comment:    Performed at [facility], [address], [place], [place] [address]         Passed - K in normal range and within 180 days    Potassium  Date Value Ref Range Status  03/23/2023 4.2 3.5 - 5.1 mmol/L Final         Passed - Na in normal range and within 180 days    Sodium  Date Value Ref Range Status  03/23/2023 138 135 - 145 mmol/L Final  12/05/2021 135 134 - 144 mmol/L Final         Passed - Last BP in normal range    BP Readings from Last 1 Encounters:  03/23/23 125/85         Passed - Valid encounter within last 6 months    Recent Outpatient Visits           1 month ago Anxiety and depression   [place] [facility], [place]   6 months ago Essential hypertension   [place] [facility] [place]   7 months ago Essential hypertension   [place] [facility], MD   9 months ago Essential hypertension   [place] [facility], MD   11 months ago Hospital discharge follow-up   [facility], MD       Future Appointments             Tomorrow Georganna Skeans, MD [facility]

## 2023-03-25 ENCOUNTER — Telehealth: Payer: Self-pay | Admitting: Family Medicine

## 2023-03-25 ENCOUNTER — Ambulatory Visit: Payer: BC Managed Care – PPO | Admitting: Family Medicine

## 2023-03-25 NOTE — Telephone Encounter (Signed)
Called pt and left vm to call office back to reschedule missed appt

## 2023-03-26 LAB — MULTIPLE MYELOMA PANEL, SERUM
Albumin SerPl Elph-Mcnc: 3.6 g/dL (ref 2.9–4.4)
Albumin/Glob SerPl: 1.4 (ref 0.7–1.7)
Alpha 1: 0.3 g/dL (ref 0.0–0.4)
Alpha2 Glob SerPl Elph-Mcnc: 1 g/dL (ref 0.4–1.0)
B-Globulin SerPl Elph-Mcnc: 1 g/dL (ref 0.7–1.3)
Gamma Glob SerPl Elph-Mcnc: 0.5 g/dL (ref 0.4–1.8)
Globulin, Total: 2.7 g/dL (ref 2.2–3.9)
IgA: 44 mg/dL — ABNORMAL LOW (ref 90–386)
IgG (Immunoglobin G), Serum: 545 mg/dL — ABNORMAL LOW (ref 603–1613)
IgM (Immunoglobulin M), Srm: 26 mg/dL (ref 20–172)
M Protein SerPl Elph-Mcnc: 0.1 g/dL — ABNORMAL HIGH
Total Protein ELP: 6.3 g/dL (ref 6.0–8.5)

## 2023-03-28 ENCOUNTER — Other Ambulatory Visit: Payer: Self-pay | Admitting: Family Medicine

## 2023-03-30 NOTE — Telephone Encounter (Signed)
Requested Prescriptions  Pending Prescriptions Disp Refills   DULoxetine (CYMBALTA) 20 MG capsule [Pharmacy Med Name: DULoxetine HCl 20 MG Oral Capsule Delayed Release Particles] 90 capsule 0    Sig: Take 1 capsule by mouth once daily     Psychiatry: Antidepressants - SNRI - duloxetine Failed - 03/28/2023  6:52 AM      Failed - Completed PHQ-2 or PHQ-9 in the last 360 days      Passed - Cr in normal range and within 360 days    Creatinine  Date Value Ref Range Status  03/23/2023 0.94 0.61 - 1.24 mg/dL Final   Creat  Date Value Ref Range Status  06/01/2012 1.23 0.50 - 1.35 mg/dL Final   Creatinine, Urine  Date Value Ref Range Status  02/20/2022 117 mg/dL Final    Comment:    Performed at [facility], [address], [place], [place] [address]         Passed - eGFR is 30 or above and within 360 days    GFR calc Af Amer  Date Value Ref Range Status  06/10/2019 80 >59 mL/min/1.73 Final   GFR, Estimated  Date Value Ref Range Status  03/23/2023 >60 >60 mL/min Final    Comment:    (NOTE) Calculated using the CKD-EPI Creatinine Equation (2021)    eGFR  Date Value Ref Range Status  12/05/2021 76 >59 mL/min/1.73 Final         Passed - Last BP in normal range    BP Readings from Last 1 Encounters:  03/23/23 125/85         Passed - Valid encounter within last 6 months    Recent Outpatient Visits           1 month ago Anxiety and depression   [place] [facility], MD   6 months ago Essential hypertension   [place] [facility], MD   8 months ago Essential hypertension   [place] [facility], MD   9 months ago Essential hypertension   [place] [facility], MD   11 months ago Hospital discharge follow-up   [facility], MD       Future Appointments             In 1  month Georganna Skeans, MD [facility] at [facility]

## 2023-04-01 LAB — IFE, DARA-SPECIFIC, SERUM
IgA: 44 mg/dL — ABNORMAL LOW (ref 90–386)
IgG (Immunoglobin G), Serum: 529 mg/dL — ABNORMAL LOW (ref 603–1613)
IgM (Immunoglobulin M), Srm: 26 mg/dL (ref 20–172)

## 2023-04-02 ENCOUNTER — Ambulatory Visit: Payer: BLUE CROSS/BLUE SHIELD | Admitting: Family Medicine

## 2023-04-03 ENCOUNTER — Other Ambulatory Visit: Payer: Self-pay | Admitting: *Deleted

## 2023-04-03 ENCOUNTER — Encounter: Payer: Self-pay | Admitting: Hematology

## 2023-04-03 DIAGNOSIS — C9000 Multiple myeloma not having achieved remission: Secondary | ICD-10-CM

## 2023-04-03 MED ORDER — REVLIMID 25 MG PO CAPS: 25.0000 mg | ORAL_CAPSULE | Freq: Every day | ORAL | 0 refills | Status: DC

## 2023-04-15 ENCOUNTER — Other Ambulatory Visit: Payer: Self-pay | Admitting: Family Medicine

## 2023-04-15 ENCOUNTER — Other Ambulatory Visit: Payer: Self-pay | Admitting: Nurse Practitioner

## 2023-04-15 DIAGNOSIS — C9000 Multiple myeloma not having achieved remission: Secondary | ICD-10-CM

## 2023-04-15 DIAGNOSIS — G893 Neoplasm related pain (acute) (chronic): Secondary | ICD-10-CM

## 2023-04-15 DIAGNOSIS — Z515 Encounter for palliative care: Secondary | ICD-10-CM

## 2023-04-16 ENCOUNTER — Other Ambulatory Visit: Payer: Self-pay | Admitting: Family Medicine

## 2023-04-17 ENCOUNTER — Encounter: Payer: Self-pay | Admitting: *Deleted

## 2023-04-17 NOTE — Progress Notes (Deleted)
Palliative Medicine [facility]  Telephone:(336) [phone] Fax:(336) [phone]   Name: [name] Date: 04/17/2023 MRN: [id number]  DOB: [birth year]  Patient Care Team: Georganna Skeans, MD as PCP - General (Family Medicine) Runell Gess, MD as PCP - Cardiology (Cardiology) Jena Gauss Gerrit Friends, MD as Attending Physician (Gastroenterology)   INTERVAL HISTORY: [name] is a 52 y.o. male with oncologic medical history including IgG Kappa Multiple Myeloma, L1, L2, and L3 vertebral compression fraction s/p kyphoplasty(9/27) and IM nailing of left humeral shaft (9/30), currently undergoing VRD therapy.  Palliative ask to see for symptom management.   SOCIAL HISTORY:     reports that he quit smoking about 15 months ago. His smoking use included cigarettes. He started smoking about 11 years ago. He has a 2.5 pack-year smoking history. He has never used smokeless tobacco. He reports current alcohol use. He reports that he does not use drugs.  ADVANCE DIRECTIVES:    CODE STATUS:   PAST MEDICAL HISTORY: Past Medical History:  Diagnosis Date   Allergy    Anemia    Atypical chest pain    Cancer (HCC)    Multiple myeloma with normocytic anemia   GERD (gastroesophageal reflux disease)    Hepatic cyst    Benign by MRI   Hiatal hernia    History of colonic polyps    Hyperlipidemia    Hypertension    Rectal bleeding    Tobacco abuse     ALLERGIES:  has No Known Allergies.  MEDICATIONS:  Current Outpatient Medications  Medication Sig Dispense Refill   acyclovir (ZOVIRAX) 400 MG tablet Take 1 tablet by mouth twice daily 60 tablet 0   allopurinol (ZYLOPRIM) 300 MG tablet Take 1 tablet by mouth once daily 30 tablet 3   aspirin EC 81 MG tablet Take 81 mg by mouth daily. Swallow whole.     cetirizine (ZYRTEC) 10 MG tablet Take 10 mg by mouth daily as needed for allergies.     chlorhexidine (PERIDEX) 0.12 % solution Swish 5 ml for 1 minute and spit out two  times a day     clonazePAM (KLONOPIN) 0.5 MG tablet Take 1 tablet (0.5 mg total) by mouth 2 (two) times daily as needed for anxiety. 20 tablet 0   docusate sodium (COLACE) 100 MG capsule Take 1 capsule (100 mg total) by mouth 2 (two) times daily as needed for mild constipation. 30 capsule 3   DULoxetine (CYMBALTA) 20 MG capsule Take 1 capsule by mouth once daily 90 capsule 0   gabapentin (NEURONTIN) 100 MG capsule Take 1 capsule by mouth twice daily 60 capsule 0   hydrochlorothiazide (HYDRODIURIL) 25 MG tablet Take 1 tablet (25 mg total) by mouth daily. 90 tablet 0   lisinopril (ZESTRIL) 40 MG tablet Take 1 tablet by mouth once daily 90 tablet 0   meloxicam (MOBIC) 15 MG tablet Take 1 tablet by mouth daily.     Methocarbamol 1000 MG TABS Take 1,000 mg by mouth every 6 (six) hours as needed. (Patient taking differently: Take 1,000 mg by mouth every 6 (six) hours as needed (muscle spasms).) 45 tablet 1   metoprolol tartrate (LOPRESSOR) 50 MG tablet 50 mg by oral route.     morphine (MS CONTIN) 30 MG 12 hr tablet Take 1 tablet (30 mg total) by mouth every 12 (twelve) hours. 60 tablet 0   Na Sulfate-K Sulfate-Mg Sulf 17.5-3.13-1.6 GM/177ML SOLN SMARTSIG:1 Kit(s) By Mouth Once  naloxone (NARCAN) nasal spray 4 mg/0.1 mL Call 911. Administer a single spray in one nostril. If no or minimal response after 2 to 3 minutes, an additional dose may be given in the alternate nostril.     nirmatrelvir & ritonavir (PAXLOVID, 300/100,) 20 x 150 MG & 10 x 100MG  TBPK TAKE 3 TABLETS TOGETHER (TWO 150 MG NIRMATRELVIR TABLETS AND ONE 100 MG RITONAVIR TABLET) BY MOUTH TWICE DAILY FOR 5 DAYS.     ondansetron (ZOFRAN) 8 MG tablet Take 1 tablet (8 mg total) by mouth every 8 (eight) hours as needed for nausea or vomiting. 60 tablet 3   pantoprazole (PROTONIX) 40 MG tablet Take 1 tablet by mouth twice daily 60 tablet 6   polyethylene glycol powder (GLYCOLAX/MIRALAX) 17 GM/SCOOP powder Take 1 capful (17 g) with water by mouth  daily. (Patient taking differently: Take 17 g by mouth daily as needed for mild constipation.) 238 g 0   predniSONE (DELTASONE) 10 MG tablet See Admin Instructions.     prochlorperazine (COMPAZINE) 10 MG tablet Take 1 tablet (10 mg total) by mouth every 6 (six) hours as needed for nausea or vomiting. 60 tablet 0   REVLIMID 25 MG capsule Take 1 capsule (25 mg total) by mouth daily. Siri Cole # [id number]  Date Obtained 04/04/2023  Take one capsule daily for14 days and then none for 7 days. 14 capsule 0   rosuvastatin (CRESTOR) 10 MG tablet Take 1 tablet by mouth once daily 30 tablet 0   sildenafil (VIAGRA) 50 MG tablet Take 1 tablet (50 mg total) by mouth daily as needed for erectile dysfunction. 10 tablet 0   No current facility-administered medications for this visit.    VITAL SIGNS: There were no vitals taken for this visit. There were no vitals filed for this visit.  Estimated body mass index is 35.62 kg/m as calculated from the following:   Height as of 01/12/23: 5\' 9"  (1.753 m).   Weight as of 03/23/23: 241 lb 3.2 oz (109.4 kg).   PERFORMANCE STATUS (ECOG) : 1 - Symptomatic but completely ambulatory  Assessment NAD, ambulatory RRR Normal breathing pattern AAO x4  IMPRESSION: [name] presents to clinic for follow-up. No acute distress. He is taking things one day at a time. Remaining active. Denies nausea, vomiting, constipation, or diarrhea. Complains of ongoing insomnia and occasional anxiety.   Neoplasm related pain [name] reports pain continues to be well-controlled. Pain is in his back. Worsens with increased activity.   MS Contin 30 mg twice daily.  Continues with Gabapentin 100 mg twice daily,  Taking medications as prescribed. Does not require breakthrough medication. Will continue to follow. Wean medications as appropriate.   Nausea/Indigestion Resolved. Appetite is doing good. Weight stable at 245lbs.   Constipation Resolved with current regimen  Overall [name]  is much improved.  Appreciative of where he is in his health journey.  He knows to contact our office as needed with any concerns.  PLAN:  MS Contin 30 mg daily Gabapentin 100 mg twice daily Klonopin daily as needed for anxiety/sleep Zofran and Compazine as needed for nausea Miralax daily for constipation I will plan to see patient back in 6-8 weeks in collaboration with other oncology appointments.  Patient knows to contact office sooner if needed.   Patient expressed understanding and was in agreement with this plan. He also understands that He can call the clinic at any time with any questions, concerns, or complaints.    Any controlled substances utilized were prescribed  in the context of palliative care. PDMP has been reviewed.    Visit consisted of counseling and education dealing with the complex and emotionally intense issues of symptom management and palliative care in the setting of serious and potentially life-threatening illness.Greater than 50%  of this time was spent counseling and coordinating care related to the above assessment and plan.  Willette Alma, AGPCNP-BC  Palliative Medicine Team/[facility]  *Please note that this is a verbal dictation therefore any spelling or grammatical errors are due to the "Dragon Medical One" system interpretation.

## 2023-04-19 NOTE — Progress Notes (Deleted)
Patient Care Team: Christopher Skeans, MD as PCP - General (Family Medicine) Christopher Gess, MD as PCP - Cardiology (Cardiology) Christopher Gauss Gerrit Friends, MD as Attending Physician (Gastroenterology)   CHIEF COMPLAINT: Follow up multiple myeloma   Oncology History  Multiple myeloma not having achieved remission (HCC)  02/16/2022 Initial Diagnosis   Multiple myeloma not having achieved remission (HCC)   02/26/2022 - 09/17/2022 Chemotherapy   Patient is on Treatment Plan : MYELOMA  RVD SQ q21d x 4 cycles     10/02/2022 -  Chemotherapy   Patient is on Treatment Plan : MYELOMA RELAPSED REFRACTORY Daratumumab SQ + Lenalidomide + Dexamethasone (DaraRd) q28d        CURRENT THERAPY: Dara/Rev/Dex  INTERVAL HISTORY [name] returns for follow up and treatment as scheduled. Last seen by Dr. Leonides Schanz 03/23/23 with C7 Dara/Rev/Dex. He was seen by [facility] dentist and cleared for bisphosphonate  ROS   Past Medical History:  Diagnosis Date   Allergy    Anemia    Atypical chest pain    Cancer (HCC)    Multiple myeloma with normocytic anemia   GERD (gastroesophageal reflux disease)    Hepatic cyst    Benign by MRI   Hiatal hernia    History of colonic polyps    Hyperlipidemia    Hypertension    Rectal bleeding    Tobacco abuse      Past Surgical History:  Procedure Laterality Date   COLONOSCOPY  01/10/2009     RMR: Normal rectum, normal colon; repeat in 2015 due to FH of colon cancer   COLONOSCOPY N/A 01/09/2014   [id number] colonic polyps-removed as described   COLONOSCOPY N/A 09/18/2016   Procedure: COLONOSCOPY;  Surgeon: Corbin Ade, MD;  Location: [facility]ITE;  Service: Endoscopy;  Laterality: N/A;  730    ESOPHAGOGASTRODUODENOSCOPY  10/04/2007   RMR: Distal esophageal erosions consistent with erosive reflux esophagitis, patulous gastroesophageal junction status post passage of a  Maloney dilator, 56 [place].  Otherwise, unremarkable esophagus.  Hiatal hernia.  Otherwise normal  stomach.  Bulbar erosion   ESOPHAGOGASTRODUODENOSCOPY N/A 01/09/2014   Erosive reflux esophagitis. Small hiatal hernia   HUMERUS IM NAIL Left 02/22/2022   Procedure: INTRAMEDULLARY (IM) NAIL HUMERAL;  Surgeon: Yolonda Kida, MD;  Location: [facility] OR;  Service: Orthopedics;  Laterality: Left;   I & D EXTREMITY Left 07/26/2018   Procedure: IRRIGATION AND DEBRIDEMENT EXTREMITY;  Surgeon: Knute Neu, MD;  Location: [facility] OR;  Service: Plastics;  Laterality: Left;   IR BONE TUMOR(S)RF ABLATION  02/05/2022   IR BONE TUMOR(S)RF ABLATION  02/24/2022   IR KYPHO EA ADDL LEVEL THORACIC OR LUMBAR  02/19/2022   IR KYPHO LUMBAR INC FX REDUCE BONE BX UNI/BIL CANNULATION INC/IMAGING  02/05/2022   IR KYPHO LUMBAR INC FX REDUCE BONE BX UNI/BIL CANNULATION INC/IMAGING  02/19/2022   PERCUTANEOUS PINNING Left 07/26/2018   Procedure: PERCUTANEOUS PINNING EXTREMITY;  Surgeon: Knute Neu, MD;  Location: [facility] OR;  Service: Plastics;  Laterality: Left;     Outpatient Encounter Medications as of 04/20/2023  Medication Sig   acyclovir (ZOVIRAX) 400 MG tablet Take 1 tablet by mouth twice daily   allopurinol (ZYLOPRIM) 300 MG tablet Take 1 tablet by mouth once daily   aspirin EC 81 MG tablet Take 81 mg by mouth daily. Swallow whole.   cetirizine (ZYRTEC) 10 MG tablet Take 10 mg by mouth daily as needed for allergies.   chlorhexidine (PERIDEX) 0.12 % solution Swish 5 ml for  1 minute and spit out two times a day   clonazePAM (KLONOPIN) 0.5 MG tablet Take 1 tablet (0.5 mg total) by mouth 2 (two) times daily as needed for anxiety.   docusate sodium (COLACE) 100 MG capsule Take 1 capsule (100 mg total) by mouth 2 (two) times daily as needed for mild constipation.   DULoxetine (CYMBALTA) 20 MG capsule Take 1 capsule by mouth once daily   gabapentin (NEURONTIN) 100 MG capsule Take 1 capsule by mouth twice daily   hydrochlorothiazide (HYDRODIURIL) 25 MG tablet Take 1 tablet (25 mg total) by mouth daily.   lisinopril (ZESTRIL) 40  MG tablet Take 1 tablet by mouth once daily   meloxicam (MOBIC) 15 MG tablet Take 1 tablet by mouth daily.   Methocarbamol 1000 MG TABS Take 1,000 mg by mouth every 6 (six) hours as needed. (Patient taking differently: Take 1,000 mg by mouth every 6 (six) hours as needed (muscle spasms).)   metoprolol tartrate (LOPRESSOR) 50 MG tablet 50 mg by oral route.   morphine (MS CONTIN) 30 MG 12 hr tablet Take 1 tablet (30 mg total) by mouth every 12 (twelve) hours.   Na Sulfate-K Sulfate-Mg Sulf 17.5-3.13-1.6 GM/177ML SOLN SMARTSIG:1 Kit(s) By Mouth Once   naloxone (NARCAN) nasal spray 4 mg/0.1 mL Call 911. Administer a single spray in one nostril. If no or minimal response after 2 to 3 minutes, an additional dose may be given in the alternate nostril.   nirmatrelvir & ritonavir (PAXLOVID, 300/100,) 20 x 150 MG & 10 x 100MG  TBPK TAKE 3 TABLETS TOGETHER (TWO 150 MG NIRMATRELVIR TABLETS AND ONE 100 MG RITONAVIR TABLET) BY MOUTH TWICE DAILY FOR 5 DAYS.   ondansetron (ZOFRAN) 8 MG tablet Take 1 tablet (8 mg total) by mouth every 8 (eight) hours as needed for nausea or vomiting.   pantoprazole (PROTONIX) 40 MG tablet Take 1 tablet by mouth twice daily   polyethylene glycol powder (GLYCOLAX/MIRALAX) 17 GM/SCOOP powder Take 1 capful (17 g) with water by mouth daily. (Patient taking differently: Take 17 g by mouth daily as needed for mild constipation.)   predniSONE (DELTASONE) 10 MG tablet See Admin Instructions.   prochlorperazine (COMPAZINE) 10 MG tablet Take 1 tablet (10 mg total) by mouth every 6 (six) hours as needed for nausea or vomiting.   REVLIMID 25 MG capsule Take 1 capsule (25 mg total) by mouth daily. Siri Cole # [id number]  Date Obtained 04/04/2023  Take one capsule daily for14 days and then none for 7 days.   rosuvastatin (CRESTOR) 10 MG tablet Take 1 tablet by mouth once daily   sildenafil (VIAGRA) 50 MG tablet Take 1 tablet (50 mg total) by mouth daily as needed for erectile dysfunction.   No  facility-administered encounter medications on file as of 04/20/2023.     There were no vitals filed for this visit. There is no height or weight on file to calculate BMI.   PHYSICAL EXAM GENERAL:alert, no distress and comfortable SKIN: no rash  EYES: sclera clear NECK: without mass LYMPH:  no palpable cervical or supraclavicular lymphadenopathy  LUNGS: clear with normal breathing effort HEART: regular rate & rhythm, no lower extremity edema ABDOMEN: abdomen soft, non-tender and normal bowel sounds NEURO: alert & oriented x 3 with fluent speech, no focal motor/sensory deficits Breast exam:  PAC without erythema    CBC    Component Value Date/Time   WBC 6.4 03/23/2023 1008   WBC 6.8 07/18/2022 0333   RBC 4.45 03/23/2023 1008  HGB 12.9 (L) 03/23/2023 1008   HGB 13.5 12/05/2021 1556   HCT 39.4 03/23/2023 1008   HCT 40.6 12/05/2021 1556   PLT 209 03/23/2023 1008   PLT 238 12/05/2021 1556   MCV 88.5 03/23/2023 1008   MCV 87 12/05/2021 1556   MCH 29.0 03/23/2023 1008   MCHC 32.7 03/23/2023 1008   RDW 13.4 03/23/2023 1008   RDW 13.3 12/05/2021 1556   LYMPHSABS 2.2 03/23/2023 1008   LYMPHSABS 3.7 (H) 12/05/2021 1556   MONOABS 0.7 03/23/2023 1008   EOSABS 0.0 03/23/2023 1008   EOSABS 0.2 12/05/2021 1556   BASOSABS 0.0 03/23/2023 1008   BASOSABS 0.0 12/05/2021 1556     CMP     Component Value Date/Time   NA 138 03/23/2023 1008   NA 135 12/05/2021 1556   K 4.2 03/23/2023 1008   CL 107 03/23/2023 1008   CO2 25 03/23/2023 1008   GLUCOSE 110 (H) 03/23/2023 1008   BUN 10 03/23/2023 1008   BUN 13 12/05/2021 1556   CREATININE 0.94 03/23/2023 1008   CREATININE 1.23 06/01/2012 1150   CALCIUM 9.3 03/23/2023 1008   PROT 7.0 03/23/2023 1008   PROT 9.0 (H) 12/05/2021 1556   ALBUMIN 4.3 03/23/2023 1008   ALBUMIN 3.7 (L) 12/05/2021 1556   AST 15 03/23/2023 1008   ALT 18 03/23/2023 1008   ALKPHOS 56 03/23/2023 1008   BILITOT 0.5 03/23/2023 1008   GFRNONAA >60 03/23/2023  1008   GFRAA 80 06/10/2019 0849     ASSESSMENT & PLAN: [name] 52 y.o. male with medical history significant for IgG kappa multiple myeloma who presents for a follow up visit.     IgG Kappa Multiple Myeloma, 1p32/1q21.  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 - Started VRD therapy on 02/26/2022. Held Velcade therapy on 09/24/2022 due to development of ocular toxicity. -Switched to Dara/Rev/Dex on 10/02/2022, most recent MM panel with M protein down to 0.1 and normalization of serum FLC -Undergoing transplant evaluation per [facility]  PLAN:  No orders of the defined types were placed in this encounter.     All questions were answered. The patient knows to call the clinic with any problems, questions or concerns. No barriers to learning were detected. I spent *** counseling the patient face to face. The total time spent in the appointment was *** and more than 50% was on counseling, review of test results, and coordination of care.   Christopher Glad, NP-C @DATE @

## 2023-04-20 ENCOUNTER — Telehealth: Payer: Self-pay | Admitting: Hematology and Oncology

## 2023-04-20 ENCOUNTER — Inpatient Hospital Stay: Payer: BLUE CROSS/BLUE SHIELD | Attending: Physician Assistant | Admitting: Nurse Practitioner

## 2023-04-20 ENCOUNTER — Inpatient Hospital Stay: Payer: BLUE CROSS/BLUE SHIELD | Admitting: Nurse Practitioner

## 2023-04-20 ENCOUNTER — Inpatient Hospital Stay: Payer: BLUE CROSS/BLUE SHIELD

## 2023-04-20 DIAGNOSIS — K219 Gastro-esophageal reflux disease without esophagitis: Secondary | ICD-10-CM | POA: Insufficient documentation

## 2023-04-20 DIAGNOSIS — E785 Hyperlipidemia, unspecified: Secondary | ICD-10-CM | POA: Insufficient documentation

## 2023-04-20 DIAGNOSIS — K449 Diaphragmatic hernia without obstruction or gangrene: Secondary | ICD-10-CM | POA: Insufficient documentation

## 2023-04-20 DIAGNOSIS — I10 Essential (primary) hypertension: Secondary | ICD-10-CM | POA: Insufficient documentation

## 2023-04-20 DIAGNOSIS — Z5112 Encounter for antineoplastic immunotherapy: Secondary | ICD-10-CM | POA: Insufficient documentation

## 2023-04-20 DIAGNOSIS — K5900 Constipation, unspecified: Secondary | ICD-10-CM | POA: Insufficient documentation

## 2023-04-20 DIAGNOSIS — Z791 Long term (current) use of non-steroidal anti-inflammatories (NSAID): Secondary | ICD-10-CM | POA: Insufficient documentation

## 2023-04-20 DIAGNOSIS — C9000 Multiple myeloma not having achieved remission: Secondary | ICD-10-CM | POA: Insufficient documentation

## 2023-04-20 DIAGNOSIS — Z79624 Long term (current) use of inhibitors of nucleotide synthesis: Secondary | ICD-10-CM | POA: Insufficient documentation

## 2023-04-20 DIAGNOSIS — Z7982 Long term (current) use of aspirin: Secondary | ICD-10-CM | POA: Insufficient documentation

## 2023-04-20 DIAGNOSIS — Z87891 Personal history of nicotine dependence: Secondary | ICD-10-CM | POA: Insufficient documentation

## 2023-04-20 DIAGNOSIS — Z79899 Other long term (current) drug therapy: Secondary | ICD-10-CM | POA: Insufficient documentation

## 2023-04-20 DIAGNOSIS — Z860100 Personal history of colon polyps, unspecified: Secondary | ICD-10-CM | POA: Insufficient documentation

## 2023-04-22 ENCOUNTER — Inpatient Hospital Stay: Payer: BLUE CROSS/BLUE SHIELD

## 2023-04-22 ENCOUNTER — Encounter: Payer: Self-pay | Admitting: Nurse Practitioner

## 2023-04-22 ENCOUNTER — Inpatient Hospital Stay (HOSPITAL_BASED_OUTPATIENT_CLINIC_OR_DEPARTMENT_OTHER): Payer: BLUE CROSS/BLUE SHIELD | Admitting: Nurse Practitioner

## 2023-04-22 ENCOUNTER — Ambulatory Visit: Payer: BLUE CROSS/BLUE SHIELD | Admitting: Hematology and Oncology

## 2023-04-22 VITALS — BP 128/72 | HR 72 | Temp 98.2°F | Resp 18 | Wt 241.0 lb

## 2023-04-22 DIAGNOSIS — E785 Hyperlipidemia, unspecified: Secondary | ICD-10-CM | POA: Diagnosis not present

## 2023-04-22 DIAGNOSIS — Z7189 Other specified counseling: Secondary | ICD-10-CM

## 2023-04-22 DIAGNOSIS — Z515 Encounter for palliative care: Secondary | ICD-10-CM

## 2023-04-22 DIAGNOSIS — Z7982 Long term (current) use of aspirin: Secondary | ICD-10-CM | POA: Diagnosis not present

## 2023-04-22 DIAGNOSIS — G893 Neoplasm related pain (acute) (chronic): Secondary | ICD-10-CM

## 2023-04-22 DIAGNOSIS — K5900 Constipation, unspecified: Secondary | ICD-10-CM | POA: Diagnosis not present

## 2023-04-22 DIAGNOSIS — Z79624 Long term (current) use of inhibitors of nucleotide synthesis: Secondary | ICD-10-CM | POA: Diagnosis not present

## 2023-04-22 DIAGNOSIS — M792 Neuralgia and neuritis, unspecified: Secondary | ICD-10-CM

## 2023-04-22 DIAGNOSIS — I10 Essential (primary) hypertension: Secondary | ICD-10-CM | POA: Diagnosis not present

## 2023-04-22 DIAGNOSIS — C9000 Multiple myeloma not having achieved remission: Secondary | ICD-10-CM | POA: Diagnosis not present

## 2023-04-22 DIAGNOSIS — Z5112 Encounter for antineoplastic immunotherapy: Secondary | ICD-10-CM | POA: Diagnosis present

## 2023-04-22 DIAGNOSIS — K219 Gastro-esophageal reflux disease without esophagitis: Secondary | ICD-10-CM | POA: Diagnosis not present

## 2023-04-22 DIAGNOSIS — K449 Diaphragmatic hernia without obstruction or gangrene: Secondary | ICD-10-CM | POA: Diagnosis not present

## 2023-04-22 DIAGNOSIS — Z860100 Personal history of colon polyps, unspecified: Secondary | ICD-10-CM | POA: Diagnosis not present

## 2023-04-22 DIAGNOSIS — Z87891 Personal history of nicotine dependence: Secondary | ICD-10-CM | POA: Diagnosis not present

## 2023-04-22 DIAGNOSIS — Z79899 Other long term (current) drug therapy: Secondary | ICD-10-CM | POA: Diagnosis not present

## 2023-04-22 DIAGNOSIS — Z791 Long term (current) use of non-steroidal anti-inflammatories (NSAID): Secondary | ICD-10-CM | POA: Diagnosis not present

## 2023-04-22 LAB — CBC WITH DIFFERENTIAL (CANCER CENTER ONLY)
Abs Immature Granulocytes: 0.02 10*3/uL (ref 0.00–0.07)
Basophils Absolute: 0 10*3/uL (ref 0.0–0.1)
Basophils Relative: 1 %
Eosinophils Absolute: 0.3 10*3/uL (ref 0.0–0.5)
Eosinophils Relative: 6 %
HCT: 39.2 % (ref 39.0–52.0)
Hemoglobin: 12.6 g/dL — ABNORMAL LOW (ref 13.0–17.0)
Immature Granulocytes: 1 %
Lymphocytes Relative: 46 %
Lymphs Abs: 1.9 10*3/uL (ref 0.7–4.0)
MCH: 28.5 pg (ref 26.0–34.0)
MCHC: 32.1 g/dL (ref 30.0–36.0)
MCV: 88.7 fL (ref 80.0–100.0)
Monocytes Absolute: 0.3 10*3/uL (ref 0.1–1.0)
Monocytes Relative: 7 %
Neutro Abs: 1.6 10*3/uL — ABNORMAL LOW (ref 1.7–7.7)
Neutrophils Relative %: 39 %
Platelet Count: 255 10*3/uL (ref 150–400)
RBC: 4.42 MIL/uL (ref 4.22–5.81)
RDW: 13.4 % (ref 11.5–15.5)
WBC Count: 4 10*3/uL (ref 4.0–10.5)
nRBC: 0 % (ref 0.0–0.2)

## 2023-04-22 LAB — CMP (CANCER CENTER ONLY)
ALT: 24 U/L (ref 0–44)
AST: 17 U/L (ref 15–41)
Albumin: 4 g/dL (ref 3.5–5.0)
Alkaline Phosphatase: 59 U/L (ref 38–126)
Anion gap: 9 (ref 5–15)
BUN: 7 mg/dL (ref 6–20)
CO2: 25 mmol/L (ref 22–32)
Calcium: 9.2 mg/dL (ref 8.9–10.3)
Chloride: 103 mmol/L (ref 98–111)
Creatinine: 0.95 mg/dL (ref 0.61–1.24)
GFR, Estimated: >60 mL/min (ref >60)
Glucose, Bld: 132 mg/dL — ABNORMAL HIGH (ref 70–99)
Potassium: 3.7 mmol/L (ref 3.5–5.1)
Sodium: 137 mmol/L (ref 135–145)
Total Bilirubin: 0.3 mg/dL (ref <1.2)
Total Protein: 6.7 g/dL (ref 6.5–8.1)

## 2023-04-22 MED ORDER — DIPHENHYDRAMINE HCL 25 MG PO CAPS
50.0000 mg | ORAL_CAPSULE | Freq: Once | ORAL | Status: AC
  Administered 2023-04-22: 50 mg via ORAL
  Filled 2023-04-22: qty 2

## 2023-04-22 MED ORDER — FAMOTIDINE 20 MG PO TABS
20.0000 mg | ORAL_TABLET | Freq: Once | ORAL | Status: AC
  Administered 2023-04-22: 20 mg via ORAL
  Filled 2023-04-22: qty 1

## 2023-04-22 MED ORDER — DEXAMETHASONE 4 MG PO TABS
40.0000 mg | ORAL_TABLET | Freq: Once | ORAL | Status: AC
  Administered 2023-04-22: 40 mg via ORAL
  Filled 2023-04-22: qty 10

## 2023-04-22 MED ORDER — DARATUMUMAB-HYALURONIDASE-FIHJ 1800-30000 MG-UT/15ML ~~LOC~~ SOLN
1800.0000 mg | Freq: Once | SUBCUTANEOUS | Status: AC
  Administered 2023-04-22: 1800 mg via SUBCUTANEOUS
  Filled 2023-04-22: qty 15

## 2023-04-22 MED ORDER — ACETAMINOPHEN 325 MG PO TABS
650.0000 mg | ORAL_TABLET | Freq: Once | ORAL | Status: AC
  Administered 2023-04-22: 650 mg via ORAL
  Filled 2023-04-22: qty 2

## 2023-04-22 NOTE — Progress Notes (Signed)
Palliative Medicine [facility]  Telephone:(336) [phone] Fax:(336) [phone]   Name: [name] Date: 04/22/2023 MRN: [id number]  DOB: 1970-11-16  Patient Care Team: Christopher Skeans, MD as PCP - General (Family Medicine) Christopher Gess, MD as PCP - Cardiology (Cardiology) Christopher Gauss Gerrit Friends, MD as Attending Physician (Gastroenterology)   INTERVAL HISTORY: [name] is a 52 y.o. male with oncologic medical history including IgG Kappa Multiple Myeloma, L1, L2, and L3 vertebral compression fraction s/p kyphoplasty(9/27) and IM nailing of left humeral shaft (9/30), currently undergoing VRD therapy.  Palliative ask to see for symptom management.   SOCIAL HISTORY:     reports that he quit smoking about 15 months ago. His smoking use included cigarettes. He started smoking about 11 years ago. He has a 2.5 pack-year smoking history. He has never used smokeless tobacco. He reports current alcohol use. He reports that he does not use drugs.  ADVANCE DIRECTIVES:    CODE STATUS:   PAST MEDICAL HISTORY: Past Medical History:  Diagnosis Date   Allergy    Anemia    Atypical chest pain    Cancer (HCC)    Multiple myeloma with normocytic anemia   GERD (gastroesophageal reflux disease)    Hepatic cyst    Benign by MRI   Hiatal hernia    History of colonic polyps    Hyperlipidemia    Hypertension    Rectal bleeding    Tobacco abuse     ALLERGIES:  has No Known Allergies.  MEDICATIONS:  Current Outpatient Medications  Medication Sig Dispense Refill   acyclovir (ZOVIRAX) 400 MG tablet Take 1 tablet by mouth twice daily 60 tablet 0   allopurinol (ZYLOPRIM) 300 MG tablet Take 1 tablet by mouth once daily 30 tablet 3   aspirin EC 81 MG tablet Take 81 mg by mouth daily. Swallow whole.     cetirizine (ZYRTEC) 10 MG tablet Take 10 mg by mouth daily as needed for allergies.     chlorhexidine (PERIDEX) 0.12 % solution Swish 5 ml for 1 minute and spit out two  times a day     clonazePAM (KLONOPIN) 0.5 MG tablet Take 1 tablet (0.5 mg total) by mouth 2 (two) times daily as needed for anxiety. 20 tablet 0   docusate sodium (COLACE) 100 MG capsule Take 1 capsule (100 mg total) by mouth 2 (two) times daily as needed for mild constipation. 30 capsule 3   DULoxetine (CYMBALTA) 20 MG capsule Take 1 capsule by mouth once daily 90 capsule 0   gabapentin (NEURONTIN) 100 MG capsule Take 1 capsule by mouth twice daily 60 capsule 0   hydrochlorothiazide (HYDRODIURIL) 25 MG tablet Take 1 tablet (25 mg total) by mouth daily. 90 tablet 0   lisinopril (ZESTRIL) 40 MG tablet Take 1 tablet by mouth once daily 90 tablet 0   meloxicam (MOBIC) 15 MG tablet Take 1 tablet by mouth daily.     Methocarbamol 1000 MG TABS Take 1,000 mg by mouth every 6 (six) hours as needed. (Patient taking differently: Take 1,000 mg by mouth every 6 (six) hours as needed (muscle spasms).) 45 tablet 1   metoprolol tartrate (LOPRESSOR) 50 MG tablet 50 mg by oral route.     morphine (MS CONTIN) 30 MG 12 hr tablet Take 1 tablet (30 mg total) by mouth every 12 (twelve) hours. 60 tablet 0   Na Sulfate-K Sulfate-Mg Sulf 17.5-3.13-1.6 GM/177ML SOLN SMARTSIG:1 Kit(s) By Mouth Once  naloxone (NARCAN) nasal spray 4 mg/0.1 mL Call 911. Administer a single spray in one nostril. If no or minimal response after 2 to 3 minutes, an additional dose may be given in the alternate nostril.     nirmatrelvir & ritonavir (PAXLOVID, 300/100,) 20 x 150 MG & 10 x 100MG  TBPK TAKE 3 TABLETS TOGETHER (TWO 150 MG NIRMATRELVIR TABLETS AND ONE 100 MG RITONAVIR TABLET) BY MOUTH TWICE DAILY FOR 5 DAYS.     ondansetron (ZOFRAN) 8 MG tablet Take 1 tablet (8 mg total) by mouth every 8 (eight) hours as needed for nausea or vomiting. 60 tablet 3   pantoprazole (PROTONIX) 40 MG tablet Take 1 tablet by mouth twice daily 60 tablet 6   polyethylene glycol powder (GLYCOLAX/MIRALAX) 17 GM/SCOOP powder Take 1 capful (17 g) with water by mouth  daily. (Patient taking differently: Take 17 g by mouth daily as needed for mild constipation.) 238 g 0   predniSONE (DELTASONE) 10 MG tablet See Admin Instructions.     prochlorperazine (COMPAZINE) 10 MG tablet Take 1 tablet (10 mg total) by mouth every 6 (six) hours as needed for nausea or vomiting. 60 tablet 0   REVLIMID 25 MG capsule Take 1 capsule (25 mg total) by mouth daily. [name] # [id number]  Date Obtained 04/04/2023  Take one capsule daily for14 days and then none for 7 days. 14 capsule 0   rosuvastatin (CRESTOR) 10 MG tablet Take 1 tablet by mouth once daily 30 tablet 0   sildenafil (VIAGRA) 50 MG tablet Take 1 tablet (50 mg total) by mouth daily as needed for erectile dysfunction. 10 tablet 0   No current facility-administered medications for this visit.    VITAL SIGNS: There were no vitals taken for this visit. There were no vitals filed for this visit.  Estimated body mass index is 35.59 kg/m as calculated from the following:   Height as of 01/12/23: 5\' 9"  (1.753 m).   Weight as of an earlier encounter on 04/22/23: 241 lb (109.3 kg).   PERFORMANCE STATUS (ECOG) : 1 - Symptomatic but completely ambulatory  Assessment NAD, ambulatory RRR Normal breathing pattern AAO x4  IMPRESSION: I saw [name] during his infusion. No acute distress. Denies nausea, vomiting, constipation, or diarrhea. Appetite is good. He is looking forward to spending time with his family for the Thanksgiving holidays.   [name] reports pain is controlled. Some days are better than others. When he does have pain it is in his lower back. He is remaining active as possible while also listening to his body and taking rest breaks as needed. Taking Gabapentin twice daily for his neuropathic pain.   No symptom management needs at this time. No adjustments to medication regimen.   Overall [name] is much improved.  Appreciative of where he is in his health journey.  He knows to contact our office as needed  with any concerns.  PLAN:  Gabapentin 100 mg twice daily Klonopin daily as needed for anxiety/sleep Zofran and Compazine as needed for nausea Miralax daily for constipation I will plan to see patient back in 6-8 weeks in collaboration with other oncology appointments.  Patient knows to contact office sooner if needed.   Patient expressed understanding and was in agreement with this plan. He also understands that He can call the clinic at any time with any questions, concerns, or complaints.    Any controlled substances utilized were prescribed in the context of palliative care. PDMP has been reviewed.  Visit consisted of counseling and education dealing with the complex and emotionally intense issues of symptom management and palliative care in the setting of serious and potentially life-threatening illness.  Willette Alma, AGPCNP-BC  Palliative Medicine Team/[facility]  *Please note that this is a verbal dictation therefore any spelling or grammatical errors are due to the "Dragon Medical One" system interpretation.

## 2023-04-22 NOTE — Patient Instructions (Signed)

## 2023-04-23 LAB — KAPPA/LAMBDA LIGHT CHAINS
Kappa free light chain: 10.5 mg/L (ref 3.3–19.4)
Kappa, lambda light chain ratio: 1.5 (ref 0.26–1.65)
Lambda free light chains: 7 mg/L (ref 5.7–26.3)

## 2023-04-29 ENCOUNTER — Encounter: Payer: Self-pay | Admitting: Family Medicine

## 2023-04-29 ENCOUNTER — Ambulatory Visit: Payer: BLUE CROSS/BLUE SHIELD | Admitting: Family Medicine

## 2023-04-29 ENCOUNTER — Other Ambulatory Visit: Payer: Self-pay | Admitting: Hematology and Oncology

## 2023-04-29 VITALS — BP 129/85 | HR 105 | Temp 99.5°F | Resp 18 | Ht 69.0 in | Wt 244.6 lb

## 2023-04-29 DIAGNOSIS — R059 Cough, unspecified: Secondary | ICD-10-CM

## 2023-04-29 DIAGNOSIS — I10 Essential (primary) hypertension: Secondary | ICD-10-CM

## 2023-04-29 DIAGNOSIS — K219 Gastro-esophageal reflux disease without esophagitis: Secondary | ICD-10-CM

## 2023-04-29 DIAGNOSIS — C9000 Multiple myeloma not having achieved remission: Secondary | ICD-10-CM

## 2023-04-29 LAB — MULTIPLE MYELOMA PANEL, SERUM
Albumin SerPl Elph-Mcnc: 3.5 g/dL (ref 2.9–4.4)
Albumin/Glob SerPl: 1.4 (ref 0.7–1.7)
Alpha 1: 0.3 g/dL (ref 0.0–0.4)
Alpha2 Glob SerPl Elph-Mcnc: 0.9 g/dL (ref 0.4–1.0)
B-Globulin SerPl Elph-Mcnc: 1 g/dL (ref 0.7–1.3)
Gamma Glob SerPl Elph-Mcnc: 0.5 g/dL (ref 0.4–1.8)
Globulin, Total: 2.6 g/dL (ref 2.2–3.9)
IgA: 56 mg/dL — ABNORMAL LOW (ref 90–386)
IgG (Immunoglobin G), Serum: 518 mg/dL — ABNORMAL LOW (ref 603–1613)
IgM (Immunoglobulin M), Srm: 27 mg/dL (ref 20–172)
Total Protein ELP: 6.1 g/dL (ref 6.0–8.5)

## 2023-04-29 MED ORDER — SPACER/AERO-HOLD CHAMBER BAGS MISC: 0 refills | Status: DC

## 2023-04-29 MED ORDER — ALBUTEROL SULFATE HFA 108 (90 BASE) MCG/ACT IN AERS: 2.0000 | INHALATION_SPRAY | Freq: Four times a day (QID) | RESPIRATORY_TRACT | 0 refills | Status: DC | PRN

## 2023-04-29 NOTE — Progress Notes (Signed)
Established Patient Office Visit  Subjective    Patient ID: [name], male    DOB: [birth year]  Age: 52 y.o. MRN: [id number]  CC:  Chief Complaint  Patient presents with   Cough    HPI RAYVION KLINKER presents for complaint of cough producgtive of whitis sputum. Patient has a history of reflux but not asthma. Patient is a past smoker. Patient also has persistent GERD sx and would like to see specialist as sx are worsening.   Outpatient Encounter Medications as of 04/29/2023  Medication Sig   acyclovir (ZOVIRAX) 400 MG tablet Take 1 tablet by mouth twice daily   albuterol (VENTOLIN HFA) 108 (90 Base) MCG/ACT inhaler Inhale 2 puffs into the lungs every 6 (six) hours as needed for wheezing or shortness of breath.   allopurinol (ZYLOPRIM) 300 MG tablet Take 1 tablet by mouth once daily   aspirin EC 81 MG tablet Take 81 mg by mouth daily. Swallow whole.   cetirizine (ZYRTEC) 10 MG tablet Take 10 mg by mouth daily as needed for allergies.   chlorhexidine (PERIDEX) 0.12 % solution Swish 5 ml for 1 minute and spit out two times a day   clonazePAM (KLONOPIN) 0.5 MG tablet Take 1 tablet (0.5 mg total) by mouth 2 (two) times daily as needed for anxiety.   docusate sodium (COLACE) 100 MG capsule Take 1 capsule (100 mg total) by mouth 2 (two) times daily as needed for mild constipation.   DULoxetine (CYMBALTA) 20 MG capsule Take 1 capsule by mouth once daily   gabapentin (NEURONTIN) 100 MG capsule Take 1 capsule by mouth twice daily   hydrochlorothiazide (HYDRODIURIL) 25 MG tablet Take 1 tablet (25 mg total) by mouth daily.   lisinopril (ZESTRIL) 40 MG tablet Take 1 tablet by mouth once daily   meloxicam (MOBIC) 15 MG tablet Take 1 tablet by mouth daily.   Methocarbamol 1000 MG TABS Take 1,000 mg by mouth every 6 (six) hours as needed. (Patient taking differently: Take 1,000 mg by mouth every 6 (six) hours as needed (muscle spasms).)   metoprolol tartrate (LOPRESSOR) 50 MG tablet 50 mg by  oral route.   morphine (MS CONTIN) 30 MG 12 hr tablet Take 1 tablet (30 mg total) by mouth every 12 (twelve) hours.   Na Sulfate-K Sulfate-Mg Sulf 17.5-3.13-1.6 GM/177ML SOLN SMARTSIG:1 Kit(s) By Mouth Once   naloxone (NARCAN) nasal spray 4 mg/0.1 mL Call 911. Administer a single spray in one nostril. If no or minimal response after 2 to 3 minutes, an additional dose may be given in the alternate nostril.   nirmatrelvir & ritonavir (PAXLOVID, 300/100,) 20 x 150 MG & 10 x 100MG  TBPK TAKE 3 TABLETS TOGETHER (TWO 150 MG NIRMATRELVIR TABLETS AND ONE 100 MG RITONAVIR TABLET) BY MOUTH TWICE DAILY FOR 5 DAYS.   ondansetron (ZOFRAN) 8 MG tablet Take 1 tablet (8 mg total) by mouth every 8 (eight) hours as needed for nausea or vomiting.   pantoprazole (PROTONIX) 40 MG tablet Take 1 tablet by mouth twice daily   polyethylene glycol powder (GLYCOLAX/MIRALAX) 17 GM/SCOOP powder Take 1 capful (17 g) with water by mouth daily. (Patient taking differently: Take 17 g by mouth daily as needed for mild constipation.)   predniSONE (DELTASONE) 10 MG tablet See Admin Instructions.   prochlorperazine (COMPAZINE) 10 MG tablet Take 1 tablet (10 mg total) by mouth every 6 (six) hours as needed for nausea or vomiting.   REVLIMID 25 MG capsule Take 1 capsule (25 mg  total) by mouth daily. Siri Cole # [id number]  Date Obtained 04/04/2023  Take one capsule daily for14 days and then none for 7 days.   rosuvastatin (CRESTOR) 10 MG tablet Take 1 tablet by mouth once daily   sildenafil (VIAGRA) 50 MG tablet Take 1 tablet (50 mg total) by mouth daily as needed for erectile dysfunction.   Spacer/Aero-Hold Chamber [employer] as directed with MDI q4 hours prn   No facility-administered encounter medications on file as of 04/29/2023.    Past Medical History:  Diagnosis Date   Allergy    Anemia    Atypical chest pain    Cancer (HCC)    Multiple myeloma with normocytic anemia   GERD (gastroesophageal reflux disease)     Hepatic cyst    Benign by MRI   Hiatal hernia    History of colonic polyps    Hyperlipidemia    Hypertension    Rectal bleeding    Tobacco abuse     Past Surgical History:  Procedure Laterality Date   COLONOSCOPY  01/10/2009     RMR: Normal rectum, normal colon; repeat in 2015 due to FH of colon cancer   COLONOSCOPY N/A 01/09/2014   [id number] colonic polyps-removed as described   COLONOSCOPY N/A 09/18/2016   Procedure: COLONOSCOPY;  Surgeon: Corbin Ade, MD;  Location: [facility];  Service: Endoscopy;  Laterality: N/A;  730    ESOPHAGOGASTRODUODENOSCOPY  10/04/2007   RMR: Distal esophageal erosions consistent with erosive reflux esophagitis, patulous gastroesophageal junction status post passage of a  Maloney dilator, 56 [place].  Otherwise, unremarkable esophagus.  Hiatal hernia.  Otherwise normal stomach.  Bulbar erosion   ESOPHAGOGASTRODUODENOSCOPY N/A 01/09/2014   Erosive reflux esophagitis. Small hiatal hernia   HUMERUS IM NAIL Left 02/22/2022   Procedure: INTRAMEDULLARY (IM) NAIL HUMERAL;  Surgeon: Yolonda Kida, MD;  [facility];  Service: Orthopedics;  Laterality: Left;   I & D EXTREMITY Left 07/26/2018   Procedure: IRRIGATION AND DEBRIDEMENT EXTREMITY;  Surgeon: Knute Neu, MD;  Location: [facility] OR;  Service: Plastics;  Laterality: Left;   IR BONE TUMOR(S)RF ABLATION  02/05/2022   IR BONE TUMOR(S)RF ABLATION  02/24/2022   IR KYPHO EA ADDL LEVEL THORACIC OR LUMBAR  02/19/2022   IR KYPHO LUMBAR INC FX REDUCE BONE BX UNI/BIL CANNULATION INC/IMAGING  02/05/2022   IR KYPHO LUMBAR INC FX REDUCE BONE BX UNI/BIL CANNULATION INC/IMAGING  02/19/2022   PERCUTANEOUS PINNING Left 07/26/2018   Procedure: PERCUTANEOUS PINNING EXTREMITY;  Surgeon: Knute Neu, MD;  Location: [facility] OR;  Service: Plastics;  Laterality: Left;    Family History  Problem Relation Age of Onset   Heart disease Mother    Hypertension Mother    Diabetes Father    Hypertension Father    Colon cancer  Sister        passed away from colon cancer, in her 66s   Heart attack Maternal Uncle    Prostate cancer Maternal Uncle    Diabetes Other    Pancreatic cancer Neg Hx    Rectal cancer Neg Hx    Esophageal cancer Neg Hx    Stomach cancer Neg Hx     Social History   Socioeconomic History   Marital status: Divorced    Spouse name: Not on file   Number of children: 2   Years of education: Not on file   Highest education level: Not on file  Occupational History   Occupation: [employer]    Employer: [employer]  Comment: [employer] in [employer]  Tobacco Use   Smoking status: Former    Current packs/day: 0.00    Average packs/day: 0.3 packs/day for 10.0 years (2.5 ttl pk-yrs)    Types: Cigarettes    Start date: 12/25/2011    Quit date: 12/24/2021    Years since quitting: 1.3   Smokeless tobacco: Never  Vaping Use   Vaping status: Never Used  Substance and Sexual Activity   Alcohol use: Yes    Alcohol/week: 0.0 standard drinks of alcohol    Comment: occasional/wine on the weekends   Drug use: No   Sexual activity: Not on file  Other Topics Concern   Not on file  Social History Narrative   Right handed   Lives alone one story home   Social Determinants of Health   Financial Resource Strain: Low Risk  (02/13/2023)   Overall Financial Resource Strain (CARDIA)    Difficulty of Paying Living Expenses: Not very hard  Food Insecurity: No Food Insecurity (07/17/2022)   Hunger Vital Sign    Worried About Running Out of Food in the Last Year: Never true    Ran Out of Food in the Last Year: Never true  Transportation Needs: No Transportation Needs (07/17/2022)   PRAPARE - Transportation    Lack of Transportation (Medical): No    Lack of Transportation (Non-Medical): No  Physical Activity: Inactive (02/13/2023)   Exercise Vital Sign    Days of Exercise per Week: 0 days    Minutes of Exercise per Session: 0 min  Stress: No Stress Concern Present (02/13/2023)   [employer] of Occupational Health - Occupational Stress Questionnaire    Feeling of Stress : Not at all  Social Connections: Unknown (02/13/2023)   Social Connection and Isolation Panel [NHANES]    Frequency of Communication with Friends and Family: More than three times a week    Frequency of Social Gatherings with Friends and Family: More than three times a week    Attends Religious Services: More than 4 times per year    Active Member of [employer] or Organizations: No    Attends Banker Meetings: Never    Marital Status: Not on file  Intimate Partner Violence: Not At Risk (07/17/2022)   Humiliation, Afraid, Rape, and Kick questionnaire    Fear of Current or Ex-Partner: No    Emotionally Abused: No    Physically Abused: No    Sexually Abused: No    Review of Systems  All other systems reviewed and are negative.       Objective    BP 129/85 (BP Location: Right Arm, Patient Position: Sitting, Cuff Size: Large)   Pulse (!) 105   Temp 99.5 F (37.5 C) (Oral)   Resp 18   Ht 5\' 9"  (1.753 m)   Wt 244 lb 9.6 oz (110.9 kg)   SpO2 95%   BMI 36.12 kg/m   Physical Exam Vitals and nursing note reviewed.  Constitutional:      General: He is not in acute distress. Cardiovascular:     Rate and Rhythm: Normal rate and regular rhythm.  Pulmonary:     Effort: Pulmonary effort is normal.     Breath sounds: Normal breath sounds.  Abdominal:     Palpations: Abdomen is soft.     Tenderness: There is no abdominal tenderness.  Neurological:     General: No focal deficit present.     Mental Status: He is alert and oriented to person,  place, and time.         Assessment & Plan:   Cough, unspecified type -     Ambulatory referral to Gastroenterology  GERD without esophagitis  Essential hypertension  Other orders -     Albuterol Sulfate HFA; Inhale 2 puffs into the lungs every 6 (six) hours as needed for wheezing or shortness of breath.  Dispense: 8 g; Refill: 0 -      Spacer/Aero-Hold Chamber Bags; Utilize as directed with MDI q4 hours prn  Dispense: 1 Piece; Refill: 0     No follow-ups on file.   Tommie Raymond, MD

## 2023-05-01 ENCOUNTER — Encounter: Payer: Self-pay | Admitting: Family Medicine

## 2023-05-05 ENCOUNTER — Other Ambulatory Visit: Payer: Self-pay | Admitting: *Deleted

## 2023-05-05 DIAGNOSIS — C9000 Multiple myeloma not having achieved remission: Secondary | ICD-10-CM

## 2023-05-05 MED ORDER — REVLIMID 25 MG PO CAPS: ORAL_CAPSULE | ORAL | 0 refills | Status: DC

## 2023-05-06 ENCOUNTER — Telehealth: Payer: Self-pay | Admitting: Family Medicine

## 2023-05-06 LAB — IFE, DARA-SPECIFIC, SERUM
IgA: 60 mg/dL — ABNORMAL LOW (ref 90–386)
IgG (Immunoglobin G), Serum: 516 mg/dL — ABNORMAL LOW (ref 603–1613)
IgM (Immunoglobulin M), Srm: 23 mg/dL (ref 20–172)

## 2023-05-06 NOTE — Telephone Encounter (Signed)
Copied from CRM [id number]. Topic: Referral - Request for Referral >> May 06, 2023 11:22 AM Phill Myron wrote: [name] who is with [facility] cell transplant .[name].([phone])  is calling to ask if Dr Andrey Campanile will resend a referral to [facility] for Pt. [name] to have a colonoscopy.

## 2023-05-11 ENCOUNTER — Other Ambulatory Visit: Payer: Self-pay | Admitting: Nurse Practitioner

## 2023-05-11 DIAGNOSIS — C9000 Multiple myeloma not having achieved remission: Secondary | ICD-10-CM

## 2023-05-11 DIAGNOSIS — Z515 Encounter for palliative care: Secondary | ICD-10-CM

## 2023-05-11 DIAGNOSIS — G893 Neoplasm related pain (acute) (chronic): Secondary | ICD-10-CM

## 2023-05-11 MED ORDER — MORPHINE SULFATE ER 30 MG PO TBCR: 30.0000 mg | EXTENDED_RELEASE_TABLET | Freq: Two times a day (BID) | ORAL | 0 refills | Status: DC

## 2023-05-11 NOTE — Telephone Encounter (Signed)
Patient advised thank

## 2023-05-13 ENCOUNTER — Other Ambulatory Visit: Payer: Self-pay | Admitting: Family Medicine

## 2023-05-15 ENCOUNTER — Encounter: Payer: Self-pay | Admitting: Hematology

## 2023-05-16 ENCOUNTER — Other Ambulatory Visit: Payer: Self-pay | Admitting: Hematology and Oncology

## 2023-05-16 DIAGNOSIS — Z7189 Other specified counseling: Secondary | ICD-10-CM

## 2023-05-16 DIAGNOSIS — C9000 Multiple myeloma not having achieved remission: Secondary | ICD-10-CM

## 2023-05-19 ENCOUNTER — Inpatient Hospital Stay: Payer: BLUE CROSS/BLUE SHIELD

## 2023-05-19 ENCOUNTER — Inpatient Hospital Stay: Payer: BLUE CROSS/BLUE SHIELD | Attending: Physician Assistant

## 2023-05-19 ENCOUNTER — Inpatient Hospital Stay: Payer: BLUE CROSS/BLUE SHIELD | Attending: Physician Assistant | Admitting: Hematology and Oncology

## 2023-05-19 VITALS — BP 134/77 | HR 83 | Temp 98.6°F | Resp 18 | Wt 244.0 lb

## 2023-05-19 DIAGNOSIS — I10 Essential (primary) hypertension: Secondary | ICD-10-CM | POA: Diagnosis not present

## 2023-05-19 DIAGNOSIS — Z860100 Personal history of colon polyps, unspecified: Secondary | ICD-10-CM | POA: Insufficient documentation

## 2023-05-19 DIAGNOSIS — G2581 Restless legs syndrome: Secondary | ICD-10-CM | POA: Diagnosis not present

## 2023-05-19 DIAGNOSIS — N529 Male erectile dysfunction, unspecified: Secondary | ICD-10-CM | POA: Diagnosis not present

## 2023-05-19 DIAGNOSIS — Z5112 Encounter for antineoplastic immunotherapy: Secondary | ICD-10-CM | POA: Insufficient documentation

## 2023-05-19 DIAGNOSIS — Z791 Long term (current) use of non-steroidal anti-inflammatories (NSAID): Secondary | ICD-10-CM | POA: Insufficient documentation

## 2023-05-19 DIAGNOSIS — Z79624 Long term (current) use of inhibitors of nucleotide synthesis: Secondary | ICD-10-CM | POA: Diagnosis not present

## 2023-05-19 DIAGNOSIS — E785 Hyperlipidemia, unspecified: Secondary | ICD-10-CM | POA: Diagnosis not present

## 2023-05-19 DIAGNOSIS — Z7982 Long term (current) use of aspirin: Secondary | ICD-10-CM | POA: Diagnosis not present

## 2023-05-19 DIAGNOSIS — C9000 Multiple myeloma not having achieved remission: Secondary | ICD-10-CM

## 2023-05-19 DIAGNOSIS — Z7189 Other specified counseling: Secondary | ICD-10-CM

## 2023-05-19 DIAGNOSIS — K5900 Constipation, unspecified: Secondary | ICD-10-CM | POA: Insufficient documentation

## 2023-05-19 DIAGNOSIS — Z800 Family history of malignant neoplasm of digestive organs: Secondary | ICD-10-CM | POA: Insufficient documentation

## 2023-05-19 DIAGNOSIS — K219 Gastro-esophageal reflux disease without esophagitis: Secondary | ICD-10-CM | POA: Insufficient documentation

## 2023-05-19 DIAGNOSIS — Z87891 Personal history of nicotine dependence: Secondary | ICD-10-CM | POA: Diagnosis not present

## 2023-05-19 LAB — CBC WITH DIFFERENTIAL (CANCER CENTER ONLY)
Abs Immature Granulocytes: 0.03 10*3/uL (ref 0.00–0.07)
Basophils Absolute: 0 10*3/uL (ref 0.0–0.1)
Basophils Relative: 0 %
Eosinophils Absolute: 0.1 10*3/uL (ref 0.0–0.5)
Eosinophils Relative: 3 %
HCT: 36.6 % — ABNORMAL LOW (ref 39.0–52.0)
Hemoglobin: 12 g/dL — ABNORMAL LOW (ref 13.0–17.0)
Immature Granulocytes: 1 %
Lymphocytes Relative: 41 %
Lymphs Abs: 2.1 10*3/uL (ref 0.7–4.0)
MCH: 28.3 pg (ref 26.0–34.0)
MCHC: 32.8 g/dL (ref 30.0–36.0)
MCV: 86.3 fL (ref 80.0–100.0)
Monocytes Absolute: 0.2 10*3/uL (ref 0.1–1.0)
Monocytes Relative: 4 %
Neutro Abs: 2.6 10*3/uL (ref 1.7–7.7)
Neutrophils Relative %: 51 %
Platelet Count: 259 10*3/uL (ref 150–400)
RBC: 4.24 MIL/uL (ref 4.22–5.81)
RDW: 13.1 % (ref 11.5–15.5)
WBC Count: 5.1 10*3/uL (ref 4.0–10.5)
nRBC: 0 % (ref 0.0–0.2)

## 2023-05-19 LAB — CMP (CANCER CENTER ONLY)
ALT: 14 U/L (ref 0–44)
AST: 13 U/L — ABNORMAL LOW (ref 15–41)
Albumin: 4.1 g/dL (ref 3.5–5.0)
Alkaline Phosphatase: 49 U/L (ref 38–126)
Anion gap: 10 (ref 5–15)
BUN: 9 mg/dL (ref 6–20)
CO2: 26 mmol/L (ref 22–32)
Calcium: 8.4 mg/dL — ABNORMAL LOW (ref 8.9–10.3)
Chloride: 102 mmol/L (ref 98–111)
Creatinine: 1.04 mg/dL (ref 0.61–1.24)
GFR, Estimated: >60 mL/min (ref >60)
Glucose, Bld: 127 mg/dL — ABNORMAL HIGH (ref 70–99)
Potassium: 3.1 mmol/L — ABNORMAL LOW (ref 3.5–5.1)
Sodium: 138 mmol/L (ref 135–145)
Total Bilirubin: 0.4 mg/dL (ref <1.2)
Total Protein: 6.6 g/dL (ref 6.5–8.1)

## 2023-05-19 MED ORDER — DEXAMETHASONE 4 MG PO TABS
40.0000 mg | ORAL_TABLET | Freq: Once | ORAL | Status: AC
Start: 2023-05-19 — End: 2023-05-19
  Administered 2023-05-19: 40 mg via ORAL
  Filled 2023-05-19: qty 10

## 2023-05-19 MED ORDER — FAMOTIDINE 20 MG PO TABS
20.0000 mg | ORAL_TABLET | Freq: Once | ORAL | Status: AC
Start: 2023-05-19 — End: 2023-05-19
  Administered 2023-05-19: 20 mg via ORAL
  Filled 2023-05-19: qty 1

## 2023-05-19 MED ORDER — DARATUMUMAB-HYALURONIDASE-FIHJ 1800-30000 MG-UT/15ML ~~LOC~~ SOLN
1800.0000 mg | Freq: Once | SUBCUTANEOUS | Status: AC
  Administered 2023-05-19: 1800 mg via SUBCUTANEOUS
  Filled 2023-05-19: qty 15

## 2023-05-19 MED ORDER — DIPHENHYDRAMINE HCL 25 MG PO CAPS
50.0000 mg | ORAL_CAPSULE | Freq: Once | ORAL | Status: AC
  Administered 2023-05-19: 50 mg via ORAL
  Filled 2023-05-19: qty 2

## 2023-05-19 MED ORDER — ALLOPURINOL 300 MG PO TABS: 300.0000 mg | ORAL_TABLET | Freq: Every day | ORAL | 3 refills | Status: DC

## 2023-05-19 MED ORDER — ACETAMINOPHEN 325 MG PO TABS
650.0000 mg | ORAL_TABLET | Freq: Once | ORAL | Status: AC
  Administered 2023-05-19: 650 mg via ORAL
  Filled 2023-05-19: qty 2

## 2023-05-19 NOTE — Progress Notes (Signed)
[facility] [phone]   [phone]  PROGRESS NOTE  Patient Care Team: Georganna Skeans, MD as PCP - General (Family Medicine) Runell Gess, MD as PCP - Cardiology (Cardiology) Jena Gauss Gerrit Friends, MD as Attending Physician (Gastroenterology)  Hematological/Oncological History # IgG Kappa Multiple Myeloma, 1p32/1q21.  01/04/2022: MRI lumbar spine shows diffuse heterogeneous marrow signal with heterogeneous enhancement throughout the thoracolumbar spine.  Additionally, there is subacute-chronic L3 vertebral body compression fraction. 01/14/2022: establish care with Dr. Leonides Schanz  02/10/2022: bone marrow biopsy shows range of plasma cell involvement from 50% on the biopsy to 60% on aspirate smear slides and close to 90% on the clot section for an overall percentage estimated at approximately 80% overall. 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  10/02/2022: Cycle 1 Day 1 of Dara/Rev/Dex 11/03/2022: Cycle 2 Day 1 of Dara/Rev/Dex 11/30/2022:  Cycle 3 Day 1 of Dara/Rev/Dex 12/29/2022: Cycle 4 Day 1 of Dara/Rev/Dex 01/26/2023: Cycle 5 Day 1 of Dara/Rev/Dex 02/23/2023: Cycle 6 Day 1 of Dara/Rev/Dex 03/23/2023: Cycle 7 Day 1 of Dara/Rev/Dex 04/20/2023: Cycle 8 Day 1 of Dara/Rev/Dex 05/19/2023: Cycle 9 Day 1 of Dara/Rev/Dex  Interval History:  [name] 52 y.o. male for continued management of IgG kappa multiple myeloma. The patient's last visit was on  03/09/2023. In the interim, he continues on dara/rev/dex. He presents today for Cycle 9, Day 1 of treatment.   On exam today Mr. Lou reports he has been well overall in the interim since our last visit.  He reports that he has had no trouble with his treatment and has had no side effects or issues.  He denies any runny nose, sore throat, or cough.  He reports he continues to work with [facility] and is only waiting on his urine test and a GI doctor evaluation.  His appetite has been strong but his energy levels have been "so-so".  He reports that he thinks he lost his prescription for allopurinol and is requesting a refill today.  He is unsure when they were able to start the transplant process.  Otherwise that he has been at his baseline level of health and is willing and able to proceed with treatment at this time.  He denies fevers, chills, sweats, shortness of breath, chest pain.  A full 10 point ROS was otherwise negative.  MEDICAL HISTORY:  Past Medical History:  Diagnosis Date   Allergy    Anemia    Atypical chest pain    Cancer (HCC)    Multiple myeloma with normocytic anemia   GERD (gastroesophageal reflux disease)    Hepatic cyst    Benign by MRI   Hiatal hernia    History of colonic polyps    Hyperlipidemia    Hypertension    Rectal bleeding    Tobacco abuse     SURGICAL HISTORY: Past Surgical History:  Procedure Laterality Date   COLONOSCOPY  01/10/2009     RMR: Normal rectum,  normal colon; repeat in 2015 due to FH of colon cancer   COLONOSCOPY N/A 01/09/2014   [id number] colonic polyps-removed as described   COLONOSCOPY N/A 09/18/2016   Procedure: COLONOSCOPY;  Surgeon: Corbin Ade, MD;  Location: [facility];  Service: Endoscopy;  Laterality: N/A;  730    ESOPHAGOGASTRODUODENOSCOPY  10/04/2007   RMR: Distal esophageal erosions consistent with erosive reflux esophagitis, patulous gastroesophageal junction status post passage of a  Maloney dilator, 56 [place].   Otherwise, unremarkable esophagus.  Hiatal hernia.  Otherwise normal stomach.  Bulbar erosion   ESOPHAGOGASTRODUODENOSCOPY N/A 01/09/2014   Erosive reflux esophagitis. Small hiatal hernia   HUMERUS IM NAIL Left 02/22/2022   Procedure: INTRAMEDULLARY (IM) NAIL HUMERAL;  Surgeon: Yolonda Kida, MD;  Location: [facility] OR;  Service: Orthopedics;  Laterality: Left;   I & D EXTREMITY Left 07/26/2018   Procedure: IRRIGATION AND DEBRIDEMENT EXTREMITY;  Surgeon: Knute Neu, MD;  Location: [facility] OR;  Service: Plastics;  Laterality: Left;   IR BONE TUMOR(S)RF ABLATION  02/05/2022   IR BONE TUMOR(S)RF ABLATION  02/24/2022   IR KYPHO EA ADDL LEVEL THORACIC OR LUMBAR  02/19/2022   IR KYPHO LUMBAR INC FX REDUCE BONE BX UNI/BIL CANNULATION INC/IMAGING  02/05/2022   IR KYPHO LUMBAR INC FX REDUCE BONE BX UNI/BIL CANNULATION INC/IMAGING  02/19/2022   PERCUTANEOUS PINNING Left 07/26/2018   Procedure: PERCUTANEOUS PINNING EXTREMITY;  Surgeon: Knute Neu, MD;  Location: [facility] OR;  Service: Plastics;  Laterality: Left;    SOCIAL HISTORY: Social History   Socioeconomic History   Marital status: Divorced    Spouse name: Not on file   Number of children: 2   Years of education: Not on file   Highest education level: Not on file  Occupational History   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[employer]    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[employer]  Tobacco Use   Smoking status: Former    Current packs/day: 0.00    Average packs/day: 0.3 packs/day for 10.0 years (2.5 ttl pk-yrs)    Types: Cigarettes    Start date: 12/25/2011    Quit date: 12/24/2021    Years since quitting: 1.4   Smokeless tobacco: Never  Vaping Use   Vaping status: Never Used  Substance and Sexual Activity   Alcohol use: Yes    Alcohol/week: 0.0 standard drinks of alcohol    Comment: occasional/wine on the weekends   Drug use: No   Sexual activity: Not on file  Other Topics Concern   Not on file  Social History Narrative   Right handed    Lives alone one story home   Social Drivers of Health   Financial Resource Strain: Low Risk  (02/13/2023)   Overall Financial Resource Strain (CARDIA)    Difficulty of Paying Living Expenses: Not very hard  Food Insecurity: No Food Insecurity (07/17/2022)   Hunger Vital Sign    Worried About Running Out of Food in the Last Year: Never true    Ran Out of Food in the Last Year: Never true  Transportation Needs: No Transportation Needs (07/17/2022)   PRAPARE - Transportation    Lack of Transportation (Medical): No    Lack of Transportation (Non-Medical): No  Physical Activity: Inactive (02/13/2023)   Exercise Vital Sign    Days of Exercise per Week: 0 days    Minutes of Exercise per Session: 0 min  Stress: No Stress Concern Present (02/13/2023)   [employer] of Occupational Health - Occupational Stress Questionnaire  Feeling of Stress : Not at all  Social Connections: Unknown (02/13/2023)   Social Connection and Isolation Panel [NHANES]    Frequency of Communication with Friends and Family: More than three times a week    Frequency of Social Gatherings with Friends and Family: More than three times a week    Attends Religious Services: More than 4 times per year    Active Member of [employer] or Organizations: No    Attends Banker Meetings: Never    Marital Status: Not on file  Intimate Partner Violence: Not At Risk (07/17/2022)   Humiliation, Afraid, Rape, and Kick questionnaire    Fear of Current or Ex-Partner: No    Emotionally Abused: No    Physically Abused: No    Sexually Abused: No    FAMILY HISTORY: Family History  Problem Relation Age of Onset   Heart disease Mother    Hypertension Mother    Diabetes Father    Hypertension Father    Colon cancer Sister        passed away from colon cancer, in her 23s   Heart attack Maternal Uncle    Prostate cancer Maternal Uncle    Diabetes Other    Pancreatic cancer Neg Hx    Rectal cancer Neg Hx    Esophageal  cancer Neg Hx    Stomach cancer Neg Hx     ALLERGIES:  has no known allergies.  MEDICATIONS:  Current Outpatient Medications  Medication Sig Dispense Refill   acyclovir (ZOVIRAX) 400 MG tablet Take 1 tablet by mouth twice daily 60 tablet 0   albuterol (VENTOLIN HFA) 108 (90 Base) MCG/ACT inhaler Inhale 2 puffs into the lungs every 6 (six) hours as needed for wheezing or shortness of breath. 8 g 0   allopurinol (ZYLOPRIM) 300 MG tablet Take 1 tablet (300 mg total) by mouth daily. 30 tablet 3   aspirin EC 81 MG tablet Take 81 mg by mouth daily. Swallow whole.     cetirizine (ZYRTEC) 10 MG tablet Take 10 mg by mouth daily as needed for allergies.     chlorhexidine (PERIDEX) 0.12 % solution Swish 5 ml for 1 minute and spit out two times a day     clonazePAM (KLONOPIN) 0.5 MG tablet Take 1 tablet (0.5 mg total) by mouth 2 (two) times daily as needed for anxiety. 20 tablet 0   docusate sodium (COLACE) 100 MG capsule Take 1 capsule (100 mg total) by mouth 2 (two) times daily as needed for mild constipation. 30 capsule 3   DULoxetine (CYMBALTA) 20 MG capsule Take 1 capsule by mouth once daily 90 capsule 0   gabapentin (NEURONTIN) 100 MG capsule Take 1 capsule by mouth twice daily 60 capsule 0   hydrochlorothiazide (HYDRODIURIL) 25 MG tablet Take 1 tablet (25 mg total) by mouth daily. 90 tablet 0   lisinopril (ZESTRIL) 40 MG tablet Take 1 tablet by mouth once daily 90 tablet 0   meloxicam (MOBIC) 15 MG tablet Take 1 tablet by mouth daily.     Methocarbamol 1000 MG TABS Take 1,000 mg by mouth every 6 (six) hours as needed. (Patient taking differently: Take 1,000 mg by mouth every 6 (six) hours as needed (muscle spasms).) 45 tablet 1   metoprolol tartrate (LOPRESSOR) 50 MG tablet 50 mg by oral route.     morphine (MS CONTIN) 30 MG 12 hr tablet Take 1 tablet (30 mg total) by mouth every 12 (twelve) hours. 60 tablet 0   Na  Sulfate-K Sulfate-Mg Sulf 17.5-3.13-1.6 GM/177ML SOLN SMARTSIG:1 Kit(s) By Mouth  Once     naloxone (NARCAN) nasal spray 4 mg/0.1 mL Call 911. Administer a single spray in one nostril. If no or minimal response after 2 to 3 minutes, an additional dose may be given in the alternate nostril.     nirmatrelvir & ritonavir (PAXLOVID, 300/100,) 20 x 150 MG & 10 x 100MG  TBPK TAKE 3 TABLETS TOGETHER (TWO 150 MG NIRMATRELVIR TABLETS AND ONE 100 MG RITONAVIR TABLET) BY MOUTH TWICE DAILY FOR 5 DAYS.     ondansetron (ZOFRAN) 8 MG tablet Take 1 tablet (8 mg total) by mouth every 8 (eight) hours as needed for nausea or vomiting. 60 tablet 3   pantoprazole (PROTONIX) 40 MG tablet Take 1 tablet by mouth twice daily 60 tablet 6   polyethylene glycol powder (GLYCOLAX/MIRALAX) 17 GM/SCOOP powder Take 1 capful (17 g) with water by mouth daily. (Patient taking differently: Take 17 g by mouth daily as needed for mild constipation.) 238 g 0   predniSONE (DELTASONE) 10 MG tablet See Admin Instructions.     prochlorperazine (COMPAZINE) 10 MG tablet Take 1 tablet (10 mg total) by mouth every 6 (six) hours as needed for nausea or vomiting. 60 tablet 0   REVLIMID 25 MG capsule Take one capsule daily for 14 days and then none for 7 days [employer] auth# [id number] obtained 05/05/23 14 capsule 0   rosuvastatin (CRESTOR) 10 MG tablet Take 1 tablet by mouth once daily 30 tablet 0   sildenafil (VIAGRA) 50 MG tablet Take 1 tablet (50 mg total) by mouth daily as needed for erectile dysfunction. 10 tablet 0   Spacer/Aero-Hold Chamber Bags MISC Utilize as directed with MDI q4 hours prn 1 Piece 0   No current facility-administered medications for this visit.    REVIEW OF SYSTEMS:   Constitutional: ( - ) fevers, ( - )  chills , ( - ) night sweats Eyes: ( - ) blurriness of vision, ( - ) double vision, ( - ) watery eyes Ears, nose, mouth, throat, and face: ( - ) mucositis, ( - ) sore throat Respiratory: ( - ) cough, ( - ) dyspnea, ( - ) wheezes Cardiovascular: ( - ) palpitation, ( - ) chest discomfort, ( - ) lower  extremity swelling Gastrointestinal:  ( - ) nausea, ( +) heartburn, ( - ) change in bowel habits Skin: ( - ) abnormal skin rashes Lymphatics: ( - ) new lymphadenopathy, ( - ) easy bruising Neurological: ( - ) numbness, ( - ) tingling, ( - ) new weaknesses Behavioral/Psych: ( - ) mood change, ( - ) new changes  All other systems were reviewed with the patient and are negative.  PHYSICAL EXAMINATION: ECOG PERFORMANCE STATUS: 1 - Symptomatic but completely ambulatory  There were no vitals filed for this visit. There were no vitals filed for this visit.  GENERAL: Well-appearing young African-American male, alert, no distress and comfortable SKIN: skin color, texture, turgor are normal, no rashes or significant lesions EYES: conjunctiva are pink and non-injected, sclera clear. LUNGS: clear to auscultation and percussion with normal breathing effort HEART: regular rate & rhythm and no murmurs and no lower extremity edema Musculoskeletal: no cyanosis of digits and no clubbing  PSYCH: alert & oriented x 3, fluent speech NEURO: no focal motor/sensory deficits  LABORATORY DATA:  I have reviewed the data as listed    Latest Ref Rng & Units 05/19/2023    8:22 AM 04/22/2023  7:39 AM 03/23/2023   10:08 AM  CBC  WBC 4.0 - 10.5 K/uL 5.1  4.0  6.4   Hemoglobin 13.0 - 17.0 g/dL 16.1  09.6  04.5   Hematocrit 39.0 - 52.0 % 36.6  39.2  39.4   Platelets 150 - 400 K/uL 259  255  209        Latest Ref Rng & Units 05/19/2023    8:22 AM 04/22/2023    7:39 AM 03/23/2023   10:08 AM  CMP  Glucose 70 - 99 mg/dL 409  811  914   BUN 6 - 20 mg/dL 9  7  10    Creatinine 0.61 - 1.24 mg/dL 7.82  9.56  2.13   Sodium 135 - 145 mmol/L 138  137  138   Potassium 3.5 - 5.1 mmol/L 3.1  3.7  4.2   Chloride 98 - 111 mmol/L 102  103  107   CO2 22 - 32 mmol/L 26  25  25    Calcium 8.9 - 10.3 mg/dL 8.4  9.2  9.3   Total Protein 6.5 - 8.1 g/dL 6.6  6.7  7.0   Total Bilirubin <1.2 mg/dL 0.4  0.3  0.5   Alkaline  Phos 38 - 126 U/L 49  59  56   AST 15 - 41 U/L 13  17  15    ALT 0 - 44 U/L 14  24  18      Lab Results  Component Value Date   MPROTEIN Not Observed 04/22/2023   MPROTEIN 0.1 (H) 03/23/2023   MPROTEIN 0.1 (H) 02/23/2023   Lab Results  Component Value Date   KPAFRELGTCHN 10.5 04/22/2023   KPAFRELGTCHN 7.9 03/23/2023   KPAFRELGTCHN 10.0 02/23/2023   LAMBDASER 7.0 04/22/2023   LAMBDASER 4.6 (L) 03/23/2023   LAMBDASER 6.2 02/23/2023   KAPLAMBRATIO 1.50 04/22/2023   KAPLAMBRATIO 1.72 (H) 03/23/2023   KAPLAMBRATIO 1.61 02/23/2023   RADIOGRAPHIC STUDIES: No results found.  ASSESSMENT & PLAN Christopher MIAH 52 y.o. male with medical history significant for IgG kappa multiple myeloma who presents for a follow up visit.   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 -- Recommend VRD chemotherapy with intention of proceeding to transplant. -- Labs at each visit to include CBC, CMP, LDH with monthly restaging labs SPEP and serum free light chains -- Started VRD therapy on 02/26/2022. Held Velcade therapy on 09/24/2022 due to development of ocular toxicity. --Switched to Dara/Rev/Dex on 10/02/2022 PLAN: --Due for Cycle 9, Day 1 of Dara/Rev/Dex.  --Labs today reviewed and adequate for treatment. WBC 5.1, hemoglobin 12.0, MCV 86.3, platelets 259 --Most recent myeloma labs from 02/22/2022 detected M protein at 0.1 with normalized serum free light chain and ratio. --Proceed with treatment today without any dose modifications.  -- Patient has made contact with the bone marrow transplant unit of [facility].  Currently undergoing evaluation there.  Holding Xgeva per their request. --RTC for weekly treatments with q 2 week toxicity checks.   #Erectile Dysfunction -- Likely secondary to chemotherapy. --prescribed Viagra 50 mg p.o. as  needed x 10 pills.  --Patient notes he is not getting the results he would like, encouraged him to double the dose to Viagra 100 mg p.o. to see if that is more effective. -- If ineffective could consider Cialis.  # Leg Discomfort/Restless Leg -- Patient provided with  Requip by the emergency department, discontinued due to poor efficacy. --Evaluated by Dr. Barbaraann Cao on 07/28/2022. Recommended to short course of steroids so started prednisone 50 mg daily x 7 days. --Discussed etiologies including iron deficiency so iron levels were checked and was normal.   #Constipation--improving: --Recommend to continue on colace but add miralax as well.   #Pathologic fractures-secondary to MM: --Involving L1, L2 and L3 compression fracture and left humerus fracture.  --Underwent kyphoplasty of L3 fracture on 02/19/2022 --Underwent medullary nailing of left humeral shaft on 02/22/2022 --Underwent kyphoplasty on 06/04/2022.   #Pain Medication -- Per palliative care patient has weaned off MS contin. Currently conitnues on gabapentin 100 mg BID, cymbalta 20 mg and robaxin as needed.  --Under the care of palliative care team.   #Supportive Care -- chemotherapy education complete -- port placement not required -- zofran 8mg  q8H PRN and compazine 10mg  PO q6H for nausea -- acyclovir 400mg  PO BID for VCZ prophylaxis -- allopurinol 300mg  PO daily for TLS prophylaxis -- Dental clearance for Zometa/Denosumab approved.  HOLDING per [facility] transplant request.   No orders of the defined types were placed in this encounter.   All questions were answered. The patient knows to call the clinic with any problems, questions or concerns.  I have spent a total of 30 minutes minutes of face-to-face and non-face-to-face time, preparing to see the patient,performing a medically appropriate examination, counseling and educating the patient, documenting clinical information in the electronic health record,  and care  coordination.  Ulysees Barns, MD Department of Hematology/Oncology [facility] Phone: [phone] Pager: [phone] Email: [email or web address]

## 2023-05-19 NOTE — Patient Instructions (Addendum)

## 2023-05-21 LAB — KAPPA/LAMBDA LIGHT CHAINS
Kappa free light chain: 9.9 mg/L (ref 3.3–19.4)
Kappa, lambda light chain ratio: 1.6 (ref 0.26–1.65)
Lambda free light chains: 6.2 mg/L (ref 5.7–26.3)

## 2023-05-22 ENCOUNTER — Other Ambulatory Visit: Payer: Self-pay | Admitting: *Deleted

## 2023-05-22 DIAGNOSIS — C9000 Multiple myeloma not having achieved remission: Secondary | ICD-10-CM

## 2023-05-22 MED ORDER — REVLIMID 25 MG PO CAPS: ORAL_CAPSULE | ORAL | 0 refills | Status: DC

## 2023-05-23 ENCOUNTER — Other Ambulatory Visit: Payer: Self-pay | Admitting: Family Medicine

## 2023-05-26 ENCOUNTER — Other Ambulatory Visit: Payer: Self-pay | Admitting: Hematology and Oncology

## 2023-05-27 LAB — MULTIPLE MYELOMA PANEL, SERUM
Albumin SerPl Elph-Mcnc: 3.3 g/dL (ref 2.9–4.4)
Albumin/Glob SerPl: 1.4 (ref 0.7–1.7)
Alpha 1: 0.3 g/dL (ref 0.0–0.4)
Alpha2 Glob SerPl Elph-Mcnc: 0.9 g/dL (ref 0.4–1.0)
B-Globulin SerPl Elph-Mcnc: 0.9 g/dL (ref 0.7–1.3)
Gamma Glob SerPl Elph-Mcnc: 0.5 g/dL (ref 0.4–1.8)
Globulin, Total: 2.5 g/dL (ref 2.2–3.9)
IgA: 48 mg/dL — ABNORMAL LOW (ref 90–386)
IgG (Immunoglobin G), Serum: 457 mg/dL — ABNORMAL LOW (ref 603–1613)
IgM (Immunoglobulin M), Srm: 18 mg/dL — ABNORMAL LOW (ref 20–172)
M Protein SerPl Elph-Mcnc: 0.1 g/dL — ABNORMAL HIGH
Total Protein ELP: 5.8 g/dL — ABNORMAL LOW (ref 6.0–8.5)

## 2023-06-01 LAB — IFE, DARA-SPECIFIC, SERUM
IgA: 49 mg/dL — ABNORMAL LOW (ref 90–386)
IgG (Immunoglobin G), Serum: 466 mg/dL — ABNORMAL LOW (ref 603–1613)
IgM (Immunoglobulin M), Srm: 20 mg/dL (ref 20–172)

## 2023-06-07 ENCOUNTER — Other Ambulatory Visit: Payer: Self-pay | Admitting: Family Medicine

## 2023-06-08 ENCOUNTER — Other Ambulatory Visit: Payer: Self-pay | Admitting: Family Medicine

## 2023-06-08 ENCOUNTER — Other Ambulatory Visit: Payer: Self-pay

## 2023-06-08 DIAGNOSIS — E782 Mixed hyperlipidemia: Secondary | ICD-10-CM

## 2023-06-11 ENCOUNTER — Telehealth: Payer: Self-pay | Admitting: *Deleted

## 2023-06-11 NOTE — Telephone Encounter (Signed)
Received call  from [facility].  She states he  is having his cells collected today, in preparation for stem cell transplant. He will need to have a GI consult due to GERD, likely an EGD, colonoscopy done before he can do the transplant. His GI consult is on 06/23/23. They want him to continue his next cycle of treatment here. His next appt is on 06/15/23. Dr. Leonides Schanz made aware.  [name]'s next treatment date here is 06/15/23

## 2023-06-12 ENCOUNTER — Other Ambulatory Visit: Payer: Self-pay | Admitting: Hematology and Oncology

## 2023-06-12 ENCOUNTER — Other Ambulatory Visit: Payer: Self-pay | Admitting: *Deleted

## 2023-06-12 DIAGNOSIS — C9000 Multiple myeloma not having achieved remission: Secondary | ICD-10-CM

## 2023-06-12 MED ORDER — REVLIMID 25 MG PO CAPS: ORAL_CAPSULE | ORAL | 0 refills | Status: DC

## 2023-06-12 NOTE — Progress Notes (Unsigned)
Palliative Medicine [facility]  [phone] [phone]   Name: [name] Date: 06/12/2023 MRN: [id number]  DOB: 03/26/1971  Patient Care Team: Georganna Skeans, MD as PCP - General (Family Medicine) Runell Gess, MD as PCP - Cardiology (Cardiology) Jena Gauss Gerrit Friends, MD as Attending Physician (Gastroenterology)   INTERVAL HISTORY: [name] is a 53 y.o. male with oncologic medical history including IgG Kappa Multiple Myeloma, L1, L2, and L3 vertebral compression fraction s/p kyphoplasty(9/27) and IM nailing of left humeral shaft (9/30), currently undergoing VRD therapy.  Palliative ask to see for symptom management.   SOCIAL HISTORY:     reports that he quit smoking about 17 months ago. His smoking use included cigarettes. He started smoking about 11 years ago. He has a 2.5 pack-year smoking history. He has never used smokeless tobacco. He reports current alcohol use. He reports that he does not use drugs.  ADVANCE DIRECTIVES:    CODE STATUS:   PAST MEDICAL HISTORY: Past Medical History:  Diagnosis Date   Allergy    Anemia    Atypical chest pain    Cancer (HCC)    Multiple myeloma with normocytic anemia   GERD (gastroesophageal reflux disease)    Hepatic cyst    Benign by MRI   Hiatal hernia    History of colonic polyps    Hyperlipidemia    Hypertension    Rectal bleeding    Tobacco abuse     ALLERGIES:  has no known allergies.  MEDICATIONS:  Current Outpatient Medications  Medication Sig Dispense Refill   acyclovir (ZOVIRAX) 400 MG tablet Take 1 tablet by mouth twice daily 60 tablet 0   albuterol (VENTOLIN HFA) 108 (90 Base) MCG/ACT inhaler Inhale 2 puffs into the lungs every 6 (six) hours as needed for wheezing or shortness of breath. 8 g 0   allopurinol (ZYLOPRIM) 300 MG tablet Take 1 tablet (300 mg total) by mouth daily. 30 tablet 3   aspirin EC 81 MG tablet Take 81 mg by mouth daily. Swallow whole.      cetirizine (ZYRTEC) 10 MG tablet Take 10 mg by mouth daily as needed for allergies.     chlorhexidine (PERIDEX) 0.12 % solution Swish 5 ml for 1 minute and spit out two times a day     clonazePAM (KLONOPIN) 0.5 MG tablet Take 1 tablet (0.5 mg total) by mouth 2 (two) times daily as needed for anxiety. 20 tablet 0   docusate sodium (COLACE) 100 MG capsule Take 1 capsule (100 mg total) by mouth 2 (two) times daily as needed for mild constipation. 30 capsule 3   DULoxetine (CYMBALTA) 20 MG capsule Take 1 capsule by mouth once daily 90 capsule 0   gabapentin (NEURONTIN) 100 MG capsule Take 1 capsule by mouth twice daily 60 capsule 0   hydrochlorothiazide (HYDRODIURIL) 25 MG tablet Take 1 tablet (25 mg total) by mouth daily. 90 tablet 0   lisinopril (ZESTRIL) 40 MG tablet Take 1 tablet by mouth once daily 90 tablet 0   meloxicam (MOBIC) 15 MG tablet Take 1 tablet by mouth daily.     Methocarbamol 1000 MG TABS Take 1,000 mg by mouth every 6 (six) hours as needed. (Patient taking differently: Take 1,000 mg by mouth every 6 (six) hours as needed (muscle spasms).) 45 tablet 1   metoprolol tartrate (LOPRESSOR) 50 MG tablet 50 mg by oral route.     morphine (MS CONTIN) 30 MG 12 hr tablet  Take 1 tablet (30 mg total) by mouth every 12 (twelve) hours. 60 tablet 0   Na Sulfate-K Sulfate-Mg Sulf 17.5-3.13-1.6 GM/177ML SOLN SMARTSIG:1 Kit(s) By Mouth Once     naloxone (NARCAN) nasal spray 4 mg/0.1 mL Call 911. Administer a single spray in one nostril. If no or minimal response after 2 to 3 minutes, an additional dose may be given in the alternate nostril.     nirmatrelvir & ritonavir (PAXLOVID, 300/100,) 20 x 150 MG & 10 x 100MG  TBPK TAKE 3 TABLETS TOGETHER (TWO 150 MG NIRMATRELVIR TABLETS AND ONE 100 MG RITONAVIR TABLET) BY MOUTH TWICE DAILY FOR 5 DAYS.     ondansetron (ZOFRAN) 8 MG tablet Take 1 tablet (8 mg total) by mouth every 8 (eight) hours as needed for nausea or vomiting. 60 tablet 3   pantoprazole (PROTONIX)  40 MG tablet Take 1 tablet by mouth twice daily 60 tablet 6   polyethylene glycol powder (GLYCOLAX/MIRALAX) 17 GM/SCOOP powder Take 1 capful (17 g) with water by mouth daily. (Patient taking differently: Take 17 g by mouth daily as needed for mild constipation.) 238 g 0   predniSONE (DELTASONE) 10 MG tablet See Admin Instructions.     prochlorperazine (COMPAZINE) 10 MG tablet Take 1 tablet (10 mg total) by mouth every 6 (six) hours as needed for nausea or vomiting. 60 tablet 0   REVLIMID 25 MG capsule Take one capsule daily for 14 days and then none for 7 days [employer] auth# [id number]  obtained 05/22/23 14 capsule 0   rosuvastatin (CRESTOR) 10 MG tablet Take 1 tablet by mouth once daily 90 tablet 0   sildenafil (VIAGRA) 50 MG tablet TAKE 1 TABLET BY MOUTH ONCE DAILY AS NEEDED FOR ERECTILE DYSFUNCTION 10 tablet 0   Spacer/Aero-Holding Chambers (EQ SPACE CHAMBER ANTI-STATIC L) DEVI USE AS DIRECTED EVERY 4 HOURS AS NEEDED WITH  MDI 1 each 0   No current facility-administered medications for this visit.    VITAL SIGNS: There were no vitals taken for this visit. There were no vitals filed for this visit.  Estimated body mass index is 36.03 kg/m as calculated from the following:   Height as of 04/29/23: 5\' 9"  (1.753 m).   Weight as of 05/19/23: 244 lb (110.7 kg).   PERFORMANCE STATUS (ECOG) : 1 - Symptomatic but completely ambulatory  Assessment NAD, ambulatory RRR Normal breathing pattern AAO x4  IMPRESSION: I saw [name] during his infusion. No acute distress. Denies nausea, vomiting, constipation, or diarrhea. Appetite is good. He is looking forward to spending time with his family for the Thanksgiving holidays.   [name] reports pain is controlled. Some days are better than others. When he does have pain it is in his lower back. He is remaining active as possible while also listening to his body and taking rest breaks as needed. Taking Gabapentin twice daily for his neuropathic pain.    No symptom management needs at this time. No adjustments to medication regimen.   Overall [name] is much improved.  Appreciative of where he is in his health journey.  He knows to contact our office as needed with any concerns.  PLAN:  Gabapentin 100 mg twice daily Klonopin daily as needed for anxiety/sleep Zofran and Compazine as needed for nausea Miralax daily for constipation I will plan to see patient back in 6-8 weeks in collaboration with other oncology appointments.  Patient knows to contact office sooner if needed.   Patient expressed understanding and was in agreement with this plan.  He also understands that He can call the clinic at any time with any questions, concerns, or complaints.    Any controlled substances utilized were prescribed in the context of palliative care. PDMP has been reviewed.    Visit consisted of counseling and education dealing with the complex and emotionally intense issues of symptom management and palliative care in the setting of serious and potentially life-threatening illness.  Willette Alma, AGPCNP-BC  Palliative Medicine Team/[facility]  *Please note that this is a verbal dictation therefore any spelling or grammatical errors are due to the "[employer] One" system interpretation.

## 2023-06-14 NOTE — Progress Notes (Unsigned)
[facility] [phone]   [phone]  PROGRESS NOTE  Patient Care Team: Georganna Skeans, MD as PCP - General (Family Medicine) Runell Gess, MD as PCP - Cardiology (Cardiology) Jena Gauss Gerrit Friends, MD as Attending Physician (Gastroenterology)  Hematological/Oncological History # IgG Kappa Multiple Myeloma, 1p32/1q21.  01/04/2022: MRI lumbar spine shows diffuse heterogeneous marrow signal with heterogeneous enhancement throughout the thoracolumbar spine.  Additionally, there is subacute-chronic L3 vertebral body compression fraction. 01/14/2022: establish care with Dr. Leonides Schanz  02/10/2022: bone marrow biopsy shows range of plasma cell involvement from 50% on the biopsy to 60% on aspirate smear slides and close to 90% on the clot section for an overall percentage estimated at approximately 80% overall. 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  10/02/2022: Cycle 1 Day 1 of Dara/Rev/Dex 11/03/2022: Cycle 2 Day 1 of Dara/Rev/Dex 11/30/2022:  Cycle 3 Day 1 of Dara/Rev/Dex 12/29/2022: Cycle 4 Day 1 of Dara/Rev/Dex 01/26/2023: Cycle 5 Day 1 of Dara/Rev/Dex 02/23/2023: Cycle 6 Day 1 of Dara/Rev/Dex 03/23/2023: Cycle 7 Day 1 of Dara/Rev/Dex 04/20/2023: Cycle 8 Day 1 of Dara/Rev/Dex 05/19/2023: Cycle 9 Day 1 of Dara/Rev/Dex 1/11-1/17/2025: mobilization and stem cell collection   Interval History:  [name] 53 y.o. male for continued management of IgG  kappa multiple myeloma. The patient's last visit was on 03/09/2023. In the interim, he continues on dara/rev/dex. He presents today for Cycle 10, Day 1 of treatment.   On exam today [name] reports reports that his mobilization and stem cell collection went well.  He reports that he does have a back cramp that started recently but it is mild.  He reports his appetite and energy levels are both good.  He is not having any trouble with fevers, chills, sweats.  He is not having any other infectious symptoms.  He notes he is tolerating his Darzalex shots well and he is taking his Revlimid pills without difficulty.  He took the last pill of his cycle yesterday.  He will have a new supply and to be taking the medication again a week from today..  Otherwise that he has been at his baseline level of health and is willing and able to proceed with treatment at this time.  He denies fevers, chills, sweats, shortness of breath, chest pain.  A full 10 point ROS was otherwise negative.  MEDICAL HISTORY:  Past Medical History:  Diagnosis Date   Allergy    Anemia    Atypical chest pain    Cancer (HCC)    Multiple myeloma with normocytic anemia   GERD (gastroesophageal reflux disease)    Hepatic cyst    Benign by MRI   Hiatal hernia    History of colonic polyps    Hyperlipidemia    Hypertension    Rectal bleeding    Tobacco abuse     SURGICAL HISTORY: Past Surgical History:  Procedure Laterality Date   COLONOSCOPY  01/10/2009  RMR: Normal rectum, normal colon; repeat in 2015 due to FH of colon cancer   COLONOSCOPY N/A 01/09/2014   [id number] colonic polyps-removed as described   COLONOSCOPY N/A 09/18/2016   Procedure: COLONOSCOPY;  Surgeon: Corbin Ade, MD;  Location: [facility];  Service: Endoscopy;  Laterality: N/A;  730    ESOPHAGOGASTRODUODENOSCOPY  10/04/2007   RMR: Distal esophageal erosions consistent with erosive reflux esophagitis, patulous gastroesophageal junction status post  passage of a  Maloney dilator, 56 [place].  Otherwise, unremarkable esophagus.  Hiatal hernia.  Otherwise normal stomach.  Bulbar erosion   ESOPHAGOGASTRODUODENOSCOPY N/A 01/09/2014   Erosive reflux esophagitis. Small hiatal hernia   HUMERUS IM NAIL Left 02/22/2022   Procedure: INTRAMEDULLARY (IM) NAIL HUMERAL;  Surgeon: Yolonda Kida, MD;  Location[facility] OR;  Service: Orthopedics;  Laterality: Left;   I & D EXTREMITY Left 07/26/2018   Procedure: IRRIGATION AND DEBRIDEMENT EXTREMITY;  Surgeon: Knute Neu, MD;  Location: [facility] OR;  Service: Plastics;  Laterality: Left;   IR BONE TUMOR(S)RF ABLATION  02/05/2022   IR BONE TUMOR(S)RF ABLATION  02/24/2022   IR KYPHO EA ADDL LEVEL THORACIC OR LUMBAR  02/19/2022   IR KYPHO LUMBAR INC FX REDUCE BONE BX UNI/BIL CANNULATION INC/IMAGING  02/05/2022   IR KYPHO LUMBAR INC FX REDUCE BONE BX UNI/BIL CANNULATION INC/IMAGING  02/19/2022   PERCUTANEOUS PINNING Left 07/26/2018   Procedure: PERCUTANEOUS PINNING EXTREMITY;  Surgeon: Knute Neu, MD;  Location: [facility] OR;  Service: Plastics;  Laterality: Left;    SOCIAL HISTORY: Social History   Socioeconomic History   Marital status: Divorced    Spouse name: Not on file   Number of children: 2   Years of education: Not on file   Highest education level: Not on file  Occupational History   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[employer]    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[employer]  Tobacco Use   Smoking status: Former    Current packs/day: 0.00    Average packs/day: 0.3 packs/day for 10.0 years (2.5 ttl pk-yrs)    Types: Cigarettes    Start date: 12/25/2011    Quit date: 12/24/2021    Years since quitting: 1.4   Smokeless tobacco: Never  Vaping Use   Vaping status: Never Used  Substance and Sexual Activity   Alcohol use: Yes    Alcohol/week: 0.0 standard drinks of alcohol    Comment: occasional/wine on the weekends   Drug use: No   Sexual activity: Not on file  Other Topics Concern   Not on file   Social History Narrative   Right handed   Lives alone one story home   Social Drivers of Health   Financial Resource Strain: Low Risk  (02/13/2023)   Overall Financial Resource Strain (CARDIA)    Difficulty of Paying Living Expenses: Not very hard  Food Insecurity: No Food Insecurity (07/17/2022)   Hunger Vital Sign    Worried About Running Out of Food in the Last Year: Never true    Ran Out of Food in the Last Year: Never true  Transportation Needs: No Transportation Needs (07/17/2022)   PRAPARE - Transportation    Lack of Transportation (Medical): No    Lack of Transportation (Non-Medical): No  Physical Activity: Inactive (02/13/2023)   Exercise Vital Sign    Days of Exercise per Week: 0 days    Minutes of Exercise per Session: 0 min  Stress: No Stress Concern Present (02/13/2023)   [employer] of Occupational Health -  Occupational Stress Questionnaire    Feeling of Stress : Not at all  Social Connections: Unknown (02/13/2023)   Social Connection and Isolation Panel [NHANES]    Frequency of Communication with Friends and Family: More than three times a week    Frequency of Social Gatherings with Friends and Family: More than three times a week    Attends Religious Services: More than 4 times per year    Active Member of [employer] or Organizations: No    Attends Banker Meetings: Never    Marital Status: Not on file  Intimate Partner Violence: Not At Risk (07/17/2022)   Humiliation, Afraid, Rape, and Kick questionnaire    Fear of Current or Ex-Partner: No    Emotionally Abused: No    Physically Abused: No    Sexually Abused: No    FAMILY HISTORY: Family History  Problem Relation Age of Onset   Heart disease Mother    Hypertension Mother    Diabetes Father    Hypertension Father    Colon cancer Sister        passed away from colon cancer, in her 62s   Heart attack Maternal Uncle    Prostate cancer Maternal Uncle    Diabetes Other    Pancreatic cancer Neg  Hx    Rectal cancer Neg Hx    Esophageal cancer Neg Hx    Stomach cancer Neg Hx     ALLERGIES:  has no known allergies.  MEDICATIONS:  Current Outpatient Medications  Medication Sig Dispense Refill   acyclovir (ZOVIRAX) 400 MG tablet Take 1 tablet by mouth twice daily 60 tablet 0   albuterol (VENTOLIN HFA) 108 (90 Base) MCG/ACT inhaler Inhale 2 puffs into the lungs every 6 (six) hours as needed for wheezing or shortness of breath. 8 g 0   allopurinol (ZYLOPRIM) 300 MG tablet Take 1 tablet (300 mg total) by mouth daily. 30 tablet 3   aspirin EC 81 MG tablet Take 81 mg by mouth daily. Swallow whole.     cetirizine (ZYRTEC) 10 MG tablet Take 10 mg by mouth daily as needed for allergies.     chlorhexidine (PERIDEX) 0.12 % solution Swish 5 ml for 1 minute and spit out two times a day     clonazePAM (KLONOPIN) 0.5 MG tablet Take 1 tablet (0.5 mg total) by mouth 2 (two) times daily as needed for anxiety. 20 tablet 0   docusate sodium (COLACE) 100 MG capsule Take 1 capsule (100 mg total) by mouth 2 (two) times daily as needed for mild constipation. 30 capsule 3   DULoxetine (CYMBALTA) 20 MG capsule Take 1 capsule by mouth once daily 90 capsule 0   gabapentin (NEURONTIN) 100 MG capsule Take 1 capsule by mouth twice daily 60 capsule 0   hydrochlorothiazide (HYDRODIURIL) 25 MG tablet Take 1 tablet (25 mg total) by mouth daily. 90 tablet 0   lisinopril (ZESTRIL) 40 MG tablet Take 1 tablet by mouth once daily 90 tablet 0   meloxicam (MOBIC) 15 MG tablet Take 1 tablet by mouth daily.     Methocarbamol 1000 MG TABS Take 1,000 mg by mouth every 6 (six) hours as needed. (Patient taking differently: Take 1,000 mg by mouth every 6 (six) hours as needed (muscle spasms).) 45 tablet 1   metoprolol tartrate (LOPRESSOR) 50 MG tablet 50 mg by oral route.     morphine (MS CONTIN) 30 MG 12 hr tablet Take 1 tablet (30 mg total) by mouth every 12 (twelve) hours.  60 tablet 0   Na Sulfate-K Sulfate-Mg Sulf 17.5-3.13-1.6  GM/177ML SOLN SMARTSIG:1 Kit(s) By Mouth Once     naloxone (NARCAN) nasal spray 4 mg/0.1 mL Call 911. Administer a single spray in one nostril. If no or minimal response after 2 to 3 minutes, an additional dose may be given in the alternate nostril.     nirmatrelvir & ritonavir (PAXLOVID, 300/100,) 20 x 150 MG & 10 x 100MG  TBPK TAKE 3 TABLETS TOGETHER (TWO 150 MG NIRMATRELVIR TABLETS AND ONE 100 MG RITONAVIR TABLET) BY MOUTH TWICE DAILY FOR 5 DAYS.     ondansetron (ZOFRAN) 8 MG tablet Take 1 tablet (8 mg total) by mouth every 8 (eight) hours as needed for nausea or vomiting. 60 tablet 3   pantoprazole (PROTONIX) 40 MG tablet Take 1 tablet by mouth twice daily 60 tablet 6   polyethylene glycol powder (GLYCOLAX/MIRALAX) 17 GM/SCOOP powder Take 1 capful (17 g) with water by mouth daily. (Patient taking differently: Take 17 g by mouth daily as needed for mild constipation.) 238 g 0   predniSONE (DELTASONE) 10 MG tablet See Admin Instructions.     prochlorperazine (COMPAZINE) 10 MG tablet Take 1 tablet (10 mg total) by mouth every 6 (six) hours as needed for nausea or vomiting. 60 tablet 0   REVLIMID 25 MG capsule Take one capsule daily for 14 days and then none for 7 days [employer] auth# [id number]  obtained 06/12/23 14 capsule 0   rosuvastatin (CRESTOR) 10 MG tablet Take 1 tablet by mouth once daily 90 tablet 0   sildenafil (VIAGRA) 50 MG tablet TAKE 1 TABLET BY MOUTH ONCE DAILY AS NEEDED FOR ERECTILE DYSFUNCTION 10 tablet 0   Spacer/Aero-Holding Chambers (EQ SPACE CHAMBER ANTI-STATIC L) DEVI USE AS DIRECTED EVERY 4 HOURS AS NEEDED WITH  MDI 1 each 0   No current facility-administered medications for this visit.    REVIEW OF SYSTEMS:   Constitutional: ( - ) fevers, ( - )  chills , ( - ) night sweats Eyes: ( - ) blurriness of vision, ( - ) double vision, ( - ) watery eyes Ears, nose, mouth, throat, and face: ( - ) mucositis, ( - ) sore throat Respiratory: ( - ) cough, ( - ) dyspnea, ( - )  wheezes Cardiovascular: ( - ) palpitation, ( - ) chest discomfort, ( - ) lower extremity swelling Gastrointestinal:  ( - ) nausea, ( +) heartburn, ( - ) change in bowel habits Skin: ( - ) abnormal skin rashes Lymphatics: ( - ) new lymphadenopathy, ( - ) easy bruising Neurological: ( - ) numbness, ( - ) tingling, ( - ) new weaknesses Behavioral/Psych: ( - ) mood change, ( - ) new changes  All other systems were reviewed with the patient and are negative.  PHYSICAL EXAMINATION: ECOG PERFORMANCE STATUS: 1 - Symptomatic but completely ambulatory  There were no vitals filed for this visit. There were no vitals filed for this visit.  GENERAL: Well-appearing young African-American male, alert, no distress and comfortable SKIN: skin color, texture, turgor are normal, no rashes or significant lesions EYES: conjunctiva are pink and non-injected, sclera clear. LUNGS: clear to auscultation and percussion with normal breathing effort HEART: regular rate & rhythm and no murmurs and no lower extremity edema Musculoskeletal: no cyanosis of digits and no clubbing  PSYCH: alert & oriented x 3, fluent speech NEURO: no focal motor/sensory deficits  LABORATORY DATA:  I have reviewed the data as listed    Latest Ref Rng &  Units 06/15/2023    8:21 AM 05/19/2023    8:22 AM 04/22/2023    7:39 AM  CBC  WBC 4.0 - 10.5 K/uL 8.0  5.1  4.0   Hemoglobin 13.0 - 17.0 g/dL 78.2  95.6  21.3   Hematocrit 39.0 - 52.0 % 38.3  36.6  39.2   Platelets 150 - 400 K/uL 156  259  255        Latest Ref Rng & Units 06/15/2023    8:21 AM 05/19/2023    8:22 AM 04/22/2023    7:39 AM  CMP  Glucose 70 - 99 mg/dL 086  578  469   BUN 6 - 20 mg/dL 11  9  7    Creatinine 0.61 - 1.24 mg/dL 6.29  5.28  4.13   Sodium 135 - 145 mmol/L 135  138  137   Potassium 3.5 - 5.1 mmol/L 3.6  3.1  3.7   Chloride 98 - 111 mmol/L 99  102  103   CO2 22 - 32 mmol/L 26  26  25    Calcium 8.9 - 10.3 mg/dL 9.1  8.4  9.2   Total Protein 6.5 - 8.1  g/dL 7.0  6.6  6.7   Total Bilirubin 0.0 - 1.2 mg/dL 0.5  0.4  0.3   Alkaline Phos 38 - 126 U/L 94  49  59   AST 15 - 41 U/L 18  13  17    ALT 0 - 44 U/L 22  14  24      Lab Results  Component Value Date   MPROTEIN 0.1 (H) 05/19/2023   MPROTEIN Not Observed 04/22/2023   MPROTEIN 0.1 (H) 03/23/2023   Lab Results  Component Value Date   KPAFRELGTCHN 9.9 05/19/2023   KPAFRELGTCHN 10.5 04/22/2023   KPAFRELGTCHN 7.9 03/23/2023   LAMBDASER 6.2 05/19/2023   LAMBDASER 7.0 04/22/2023   LAMBDASER 4.6 (L) 03/23/2023   KAPLAMBRATIO 1.60 05/19/2023   KAPLAMBRATIO 1.50 04/22/2023   KAPLAMBRATIO 1.72 (H) 03/23/2023   RADIOGRAPHIC STUDIES: No results found.  ASSESSMENT & PLAN SHONTE KUMP 53 y.o. male with medical history significant for IgG kappa multiple myeloma who presents for a follow up visit.   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 -- Recommend VRD chemotherapy with intention of proceeding to transplant. -- Labs at each visit to include CBC, CMP, LDH with monthly restaging labs SPEP and serum free light chains -- Started VRD therapy on 02/26/2022. Held Velcade therapy on 09/24/2022 due to development of ocular toxicity. --Switched to Dara/Rev/Dex on 10/02/2022 PLAN: --Due for Cycle 10, Day 1 of Dara/Rev/Dex.  --Labs today reviewed and adequate for treatment. WBC 8.0, hemoglobin 12.4, MCV 87.2, platelets 156 --Most recent myeloma labs from 06/05/2023 detected no M protein with normalized serum free light chain and ratio. --Proceed with treatment today without any dose modifications.  -- Patient following with the bone marrow transplant unit of [facility].  Currently undergoing evaluation there.  Holding Xgeva per their request. --RTC in 4 weeks for next treatment (though anticipate this will be held for impending  transplant)  #Erectile Dysfunction -- Likely secondary to chemotherapy. --prescribed Viagra 50 mg p.o. as needed x 10 pills.  --Patient notes he is not getting the results he would like, encouraged him to double the dose to Viagra 100 mg p.o. to see if that is  more effective. -- If ineffective could consider Cialis.  # Leg Discomfort/Restless Leg -- Patient provided with Requip by the emergency department, discontinued due to poor efficacy. --Evaluated by Dr. Barbaraann Cao on 07/28/2022. Recommended to short course of steroids so started prednisone 50 mg daily x 7 days. --Discussed etiologies including iron deficiency so iron levels were checked and was normal.   #Constipation--improving: --Recommend to continue on colace but add miralax as well.   #Pathologic fractures-secondary to MM: --Involving L1, L2 and L3 compression fracture and left humerus fracture.  --Underwent kyphoplasty of L3 fracture on 02/19/2022 --Underwent medullary nailing of left humeral shaft on 02/22/2022 --Underwent kyphoplasty on 06/04/2022.   #Pain Medication -- Per palliative care patient has weaned off MS contin. Currently conitnues on gabapentin 100 mg BID, cymbalta 20 mg and robaxin as needed.  --Under the care of palliative care team.   #Supportive Care -- chemotherapy education complete -- port placement not required -- zofran 8mg  q8H PRN and compazine 10mg  PO q6H for nausea -- acyclovir 400mg  PO BID for VCZ prophylaxis -- allopurinol 300mg  PO daily for TLS prophylaxis -- Dental clearance for Zometa/Denosumab approved.  HOLDING per [facility] transplant request.   Orders Placed This Encounter  Procedures   IFE, Dara-Specific, Serum    Standing Status:   Future    Expected Date:   07/13/2023    Expiration Date:   07/12/2024   CBC with Differential (Cancer Center Only)    Standing Status:   Future    Expected Date:   07/13/2023    Expiration Date:   07/12/2024   Multiple Myeloma Panel (SPEP&IFE w/QIG)    Standing  Status:   Future    Expected Date:   07/13/2023    Expiration Date:   07/12/2024   Kappa/lambda light chains    Standing Status:   Future    Expected Date:   07/13/2023    Expiration Date:   07/12/2024   CMP (Cancer Center only)    Standing Status:   Future    Expected Date:   07/13/2023    Expiration Date:   07/12/2024    All questions were answered. The patient knows to call the clinic with any problems, questions or concerns.  I have spent a total of 30 minutes minutes of face-to-face and non-face-to-face time, preparing to see the patient,performing a medically appropriate examination, counseling and educating the patient, documenting clinical information in the electronic health record,  and care coordination.  Ulysees Barns, MD Department of Hematology/Oncology [facility] at [facility] Phone: [phone] Pager: [phone] Email: [email or web address]

## 2023-06-15 ENCOUNTER — Encounter: Payer: Self-pay | Admitting: Nurse Practitioner

## 2023-06-15 ENCOUNTER — Inpatient Hospital Stay (HOSPITAL_BASED_OUTPATIENT_CLINIC_OR_DEPARTMENT_OTHER): Payer: BLUE CROSS/BLUE SHIELD | Admitting: Nurse Practitioner

## 2023-06-15 ENCOUNTER — Inpatient Hospital Stay (HOSPITAL_BASED_OUTPATIENT_CLINIC_OR_DEPARTMENT_OTHER): Payer: BLUE CROSS/BLUE SHIELD | Admitting: Hematology and Oncology

## 2023-06-15 ENCOUNTER — Inpatient Hospital Stay: Payer: BLUE CROSS/BLUE SHIELD | Attending: Physician Assistant

## 2023-06-15 ENCOUNTER — Inpatient Hospital Stay: Payer: BLUE CROSS/BLUE SHIELD

## 2023-06-15 VITALS — BP 133/78 | HR 93 | Temp 98.0°F | Resp 16

## 2023-06-15 DIAGNOSIS — G893 Neoplasm related pain (acute) (chronic): Secondary | ICD-10-CM | POA: Diagnosis not present

## 2023-06-15 DIAGNOSIS — Z7189 Other specified counseling: Secondary | ICD-10-CM

## 2023-06-15 DIAGNOSIS — Z5112 Encounter for antineoplastic immunotherapy: Secondary | ICD-10-CM | POA: Insufficient documentation

## 2023-06-15 DIAGNOSIS — Z515 Encounter for palliative care: Secondary | ICD-10-CM | POA: Diagnosis not present

## 2023-06-15 DIAGNOSIS — C9000 Multiple myeloma not having achieved remission: Secondary | ICD-10-CM | POA: Diagnosis not present

## 2023-06-15 LAB — CBC WITH DIFFERENTIAL (CANCER CENTER ONLY)
Abs Immature Granulocytes: 0.32 10*3/uL — ABNORMAL HIGH (ref 0.00–0.07)
Basophils Absolute: 0 10*3/uL (ref 0.0–0.1)
Basophils Relative: 0 %
Eosinophils Absolute: 0.1 10*3/uL (ref 0.0–0.5)
Eosinophils Relative: 2 %
HCT: 38.3 % — ABNORMAL LOW (ref 39.0–52.0)
Hemoglobin: 12.4 g/dL — ABNORMAL LOW (ref 13.0–17.0)
Immature Granulocytes: 4 %
Lymphocytes Relative: 17 %
Lymphs Abs: 1.4 10*3/uL (ref 0.7–4.0)
MCH: 28.2 pg (ref 26.0–34.0)
MCHC: 32.4 g/dL (ref 30.0–36.0)
MCV: 87.2 fL (ref 80.0–100.0)
Monocytes Absolute: 0.7 10*3/uL (ref 0.1–1.0)
Monocytes Relative: 9 %
Neutro Abs: 5.4 10*3/uL (ref 1.7–7.7)
Neutrophils Relative %: 68 %
Platelet Count: 156 10*3/uL (ref 150–400)
RBC: 4.39 MIL/uL (ref 4.22–5.81)
RDW: 13.5 % (ref 11.5–15.5)
WBC Count: 8 10*3/uL (ref 4.0–10.5)
nRBC: 0 % (ref 0.0–0.2)

## 2023-06-15 LAB — CMP (CANCER CENTER ONLY)
ALT: 22 U/L (ref 0–44)
AST: 18 U/L (ref 15–41)
Albumin: 4.3 g/dL (ref 3.5–5.0)
Alkaline Phosphatase: 94 U/L (ref 38–126)
Anion gap: 10 (ref 5–15)
BUN: 11 mg/dL (ref 6–20)
CO2: 26 mmol/L (ref 22–32)
Calcium: 9.1 mg/dL (ref 8.9–10.3)
Chloride: 99 mmol/L (ref 98–111)
Creatinine: 1.08 mg/dL (ref 0.61–1.24)
GFR, Estimated: >60 mL/min (ref >60)
Glucose, Bld: 129 mg/dL — ABNORMAL HIGH (ref 70–99)
Potassium: 3.6 mmol/L (ref 3.5–5.1)
Sodium: 135 mmol/L (ref 135–145)
Total Bilirubin: 0.5 mg/dL (ref 0.0–1.2)
Total Protein: 7 g/dL (ref 6.5–8.1)

## 2023-06-15 MED ORDER — FAMOTIDINE 20 MG PO TABS
20.0000 mg | ORAL_TABLET | Freq: Once | ORAL | Status: AC
  Administered 2023-06-15: 20 mg via ORAL
  Filled 2023-06-15: qty 1

## 2023-06-15 MED ORDER — DARATUMUMAB-HYALURONIDASE-FIHJ 1800-30000 MG-UT/15ML ~~LOC~~ SOLN
1800.0000 mg | Freq: Once | SUBCUTANEOUS | Status: AC
Start: 2023-06-15 — End: 2023-06-15
  Administered 2023-06-15: 1800 mg via SUBCUTANEOUS
  Filled 2023-06-15: qty 15

## 2023-06-15 MED ORDER — DEXAMETHASONE 4 MG PO TABS
40.0000 mg | ORAL_TABLET | Freq: Once | ORAL | Status: AC
  Administered 2023-06-15: 40 mg via ORAL
  Filled 2023-06-15: qty 10

## 2023-06-15 MED ORDER — DIPHENHYDRAMINE HCL 25 MG PO CAPS
50.0000 mg | ORAL_CAPSULE | Freq: Once | ORAL | Status: AC
  Administered 2023-06-15: 50 mg via ORAL
  Filled 2023-06-15: qty 2

## 2023-06-15 MED ORDER — ACETAMINOPHEN 325 MG PO TABS
650.0000 mg | ORAL_TABLET | Freq: Once | ORAL | Status: AC
Start: 2023-06-15 — End: 2023-06-15
  Administered 2023-06-15: 650 mg via ORAL
  Filled 2023-06-15: qty 2

## 2023-06-15 NOTE — Patient Instructions (Signed)

## 2023-06-16 ENCOUNTER — Other Ambulatory Visit: Payer: Self-pay | Admitting: *Deleted

## 2023-06-16 LAB — KAPPA/LAMBDA LIGHT CHAINS
Kappa free light chain: 10.7 mg/L (ref 3.3–19.4)
Kappa, lambda light chain ratio: 1.6 (ref 0.26–1.65)
Lambda free light chains: 6.7 mg/L (ref 5.7–26.3)

## 2023-06-17 ENCOUNTER — Encounter: Payer: Self-pay | Admitting: Hematology

## 2023-06-17 ENCOUNTER — Encounter: Payer: Self-pay | Admitting: Hematology and Oncology

## 2023-06-18 ENCOUNTER — Other Ambulatory Visit: Payer: Self-pay | Admitting: Family Medicine

## 2023-06-19 ENCOUNTER — Other Ambulatory Visit: Payer: Self-pay | Admitting: Nurse Practitioner

## 2023-06-19 DIAGNOSIS — G893 Neoplasm related pain (acute) (chronic): Secondary | ICD-10-CM

## 2023-06-19 DIAGNOSIS — C9000 Multiple myeloma not having achieved remission: Secondary | ICD-10-CM

## 2023-06-19 DIAGNOSIS — Z515 Encounter for palliative care: Secondary | ICD-10-CM

## 2023-06-21 LAB — MULTIPLE MYELOMA PANEL, SERUM
Albumin SerPl Elph-Mcnc: 3.5 g/dL (ref 2.9–4.4)
Albumin/Glob SerPl: 1.3 (ref 0.7–1.7)
Alpha 1: 0.3 g/dL (ref 0.0–0.4)
Alpha2 Glob SerPl Elph-Mcnc: 0.9 g/dL (ref 0.4–1.0)
B-Globulin SerPl Elph-Mcnc: 1 g/dL (ref 0.7–1.3)
Gamma Glob SerPl Elph-Mcnc: 0.4 g/dL (ref 0.4–1.8)
Globulin, Total: 2.7 g/dL (ref 2.2–3.9)
IgA: 51 mg/dL — ABNORMAL LOW (ref 90–386)
IgG (Immunoglobin G), Serum: 512 mg/dL — ABNORMAL LOW (ref 603–1613)
IgM (Immunoglobulin M), Srm: 20 mg/dL (ref 20–172)
Total Protein ELP: 6.2 g/dL (ref 6.0–8.5)

## 2023-06-26 LAB — IFE, DARA-SPECIFIC, SERUM
IgA: 53 mg/dL — ABNORMAL LOW (ref 90–386)
IgG (Immunoglobin G), Serum: 501 mg/dL — ABNORMAL LOW (ref 603–1613)
IgM (Immunoglobulin M), Srm: 20 mg/dL (ref 20–172)

## 2023-07-03 ENCOUNTER — Other Ambulatory Visit: Payer: Self-pay | Admitting: *Deleted

## 2023-07-03 ENCOUNTER — Other Ambulatory Visit: Payer: Self-pay | Admitting: Family Medicine

## 2023-07-03 DIAGNOSIS — C9000 Multiple myeloma not having achieved remission: Secondary | ICD-10-CM

## 2023-07-03 MED ORDER — REVLIMID 25 MG PO CAPS: ORAL_CAPSULE | ORAL | 0 refills | Status: DC

## 2023-07-14 ENCOUNTER — Inpatient Hospital Stay: Payer: BLUE CROSS/BLUE SHIELD

## 2023-07-14 ENCOUNTER — Telehealth: Payer: Self-pay | Admitting: *Deleted

## 2023-07-14 ENCOUNTER — Inpatient Hospital Stay: Payer: BLUE CROSS/BLUE SHIELD | Attending: Physician Assistant | Admitting: Physician Assistant

## 2023-07-14 VITALS — BP 126/88 | HR 83 | Temp 97.2°F | Resp 16 | Wt 236.2 lb

## 2023-07-14 DIAGNOSIS — M4856XA Collapsed vertebra, not elsewhere classified, lumbar region, initial encounter for fracture: Secondary | ICD-10-CM | POA: Diagnosis not present

## 2023-07-14 DIAGNOSIS — Z7982 Long term (current) use of aspirin: Secondary | ICD-10-CM | POA: Insufficient documentation

## 2023-07-14 DIAGNOSIS — Z79624 Long term (current) use of inhibitors of nucleotide synthesis: Secondary | ICD-10-CM | POA: Diagnosis not present

## 2023-07-14 DIAGNOSIS — C9000 Multiple myeloma not having achieved remission: Secondary | ICD-10-CM | POA: Diagnosis not present

## 2023-07-14 DIAGNOSIS — N529 Male erectile dysfunction, unspecified: Secondary | ICD-10-CM | POA: Diagnosis not present

## 2023-07-14 DIAGNOSIS — Z7189 Other specified counseling: Secondary | ICD-10-CM

## 2023-07-14 DIAGNOSIS — Z87891 Personal history of nicotine dependence: Secondary | ICD-10-CM | POA: Diagnosis not present

## 2023-07-14 DIAGNOSIS — G8929 Other chronic pain: Secondary | ICD-10-CM | POA: Diagnosis not present

## 2023-07-14 DIAGNOSIS — Z860100 Personal history of colon polyps, unspecified: Secondary | ICD-10-CM | POA: Diagnosis not present

## 2023-07-14 DIAGNOSIS — K5900 Constipation, unspecified: Secondary | ICD-10-CM | POA: Diagnosis not present

## 2023-07-14 DIAGNOSIS — G2581 Restless legs syndrome: Secondary | ICD-10-CM | POA: Insufficient documentation

## 2023-07-14 DIAGNOSIS — Z791 Long term (current) use of non-steroidal anti-inflammatories (NSAID): Secondary | ICD-10-CM | POA: Insufficient documentation

## 2023-07-14 DIAGNOSIS — E785 Hyperlipidemia, unspecified: Secondary | ICD-10-CM | POA: Diagnosis not present

## 2023-07-14 DIAGNOSIS — Z8042 Family history of malignant neoplasm of prostate: Secondary | ICD-10-CM | POA: Diagnosis not present

## 2023-07-14 DIAGNOSIS — Z5112 Encounter for antineoplastic immunotherapy: Secondary | ICD-10-CM | POA: Insufficient documentation

## 2023-07-14 DIAGNOSIS — K219 Gastro-esophageal reflux disease without esophagitis: Secondary | ICD-10-CM | POA: Insufficient documentation

## 2023-07-14 DIAGNOSIS — I10 Essential (primary) hypertension: Secondary | ICD-10-CM | POA: Insufficient documentation

## 2023-07-14 DIAGNOSIS — Z800 Family history of malignant neoplasm of digestive organs: Secondary | ICD-10-CM | POA: Insufficient documentation

## 2023-07-14 LAB — CBC WITH DIFFERENTIAL (CANCER CENTER ONLY)
Abs Immature Granulocytes: 0.01 10*3/uL (ref 0.00–0.07)
Basophils Absolute: 0.1 10*3/uL (ref 0.0–0.1)
Basophils Relative: 1 %
Eosinophils Absolute: 0.1 10*3/uL (ref 0.0–0.5)
Eosinophils Relative: 1 %
HCT: 40.4 % (ref 39.0–52.0)
Hemoglobin: 13.2 g/dL (ref 13.0–17.0)
Immature Granulocytes: 0 %
Lymphocytes Relative: 35 %
Lymphs Abs: 2 10*3/uL (ref 0.7–4.0)
MCH: 28.2 pg (ref 26.0–34.0)
MCHC: 32.7 g/dL (ref 30.0–36.0)
MCV: 86.3 fL (ref 80.0–100.0)
Monocytes Absolute: 0.9 10*3/uL (ref 0.1–1.0)
Monocytes Relative: 15 %
Neutro Abs: 2.8 10*3/uL (ref 1.7–7.7)
Neutrophils Relative %: 48 %
Platelet Count: 257 10*3/uL (ref 150–400)
RBC: 4.68 MIL/uL (ref 4.22–5.81)
RDW: 14.1 % (ref 11.5–15.5)
WBC Count: 5.8 10*3/uL (ref 4.0–10.5)
nRBC: 0 % (ref 0.0–0.2)

## 2023-07-14 LAB — CMP (CANCER CENTER ONLY)
ALT: 13 U/L (ref 0–44)
AST: 12 U/L — ABNORMAL LOW (ref 15–41)
Albumin: 4.5 g/dL (ref 3.5–5.0)
Alkaline Phosphatase: 72 U/L (ref 38–126)
Anion gap: 6 (ref 5–15)
BUN: 11 mg/dL (ref 6–20)
CO2: 29 mmol/L (ref 22–32)
Calcium: 9.8 mg/dL (ref 8.9–10.3)
Chloride: 102 mmol/L (ref 98–111)
Creatinine: 0.99 mg/dL (ref 0.61–1.24)
GFR, Estimated: >60 mL/min (ref >60)
Glucose, Bld: 89 mg/dL (ref 70–99)
Potassium: 3.9 mmol/L (ref 3.5–5.1)
Sodium: 137 mmol/L (ref 135–145)
Total Bilirubin: 0.4 mg/dL (ref 0.0–1.2)
Total Protein: 7.1 g/dL (ref 6.5–8.1)

## 2023-07-14 MED ORDER — ALLOPURINOL 300 MG PO TABS
300.0000 mg | ORAL_TABLET | Freq: Every day | ORAL | 3 refills | Status: DC
Start: 2023-07-14 — End: 2024-02-08

## 2023-07-14 MED ORDER — DIPHENHYDRAMINE HCL 25 MG PO CAPS
50.0000 mg | ORAL_CAPSULE | Freq: Once | ORAL | Status: AC
  Administered 2023-07-14: 50 mg via ORAL
  Filled 2023-07-14: qty 2

## 2023-07-14 MED ORDER — DARATUMUMAB-HYALURONIDASE-FIHJ 1800-30000 MG-UT/15ML ~~LOC~~ SOLN
1800.0000 mg | Freq: Once | SUBCUTANEOUS | Status: AC
  Administered 2023-07-14: 1800 mg via SUBCUTANEOUS
  Filled 2023-07-14: qty 15

## 2023-07-14 MED ORDER — ACETAMINOPHEN 325 MG PO TABS
650.0000 mg | ORAL_TABLET | Freq: Once | ORAL | Status: AC
  Administered 2023-07-14: 650 mg via ORAL
  Filled 2023-07-14: qty 2

## 2023-07-14 MED ORDER — DEXAMETHASONE 4 MG PO TABS
40.0000 mg | ORAL_TABLET | Freq: Once | ORAL | Status: AC
  Administered 2023-07-14: 40 mg via ORAL
  Filled 2023-07-14: qty 10

## 2023-07-14 MED ORDER — FAMOTIDINE 20 MG PO TABS
20.0000 mg | ORAL_TABLET | Freq: Once | ORAL | Status: AC
  Administered 2023-07-14: 20 mg via ORAL
  Filled 2023-07-14: qty 1

## 2023-07-14 NOTE — Telephone Encounter (Signed)
Pennie Rushing, RN w/[facility] left VM stating patient has tentative start date fro transplant of 3/10. Chemotherapy today at [facility] should be his last to allow a 2 week gap before he begins treatment. Joni Reining also asked if today's labs included Myeloma labs. She requested call back. TCT Joni Reining - left vm that his labs here today included myeloma labs. Dr. Leonides Schanz informed that tentative start date for transplant process is 3/10.

## 2023-07-14 NOTE — Progress Notes (Signed)
[facility] [phone]   [phone]  PROGRESS NOTE  Patient Care Team: Georganna Skeans, MD as PCP - General (Family Medicine) Runell Gess, MD as PCP - Cardiology (Cardiology) Jena Gauss Gerrit Friends, MD as Attending Physician (Gastroenterology)  Hematological/Oncological History # IgG Kappa Multiple Myeloma, 1p32/1q21.  01/04/2022: MRI lumbar spine shows diffuse heterogeneous marrow signal with heterogeneous enhancement throughout the thoracolumbar spine.  Additionally, there is subacute-chronic L3 vertebral body compression fraction. 01/14/2022: establish care with Dr. Leonides Schanz  02/10/2022: bone marrow biopsy shows range of plasma cell involvement from 50% on the biopsy to 60% on aspirate smear slides and close to 90% on the clot section for an overall percentage estimated at approximately 80% overall. 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  10/02/2022: Cycle 1 Day 1 of Dara/Rev/Dex 11/03/2022: Cycle 2 Day 1 of Dara/Rev/Dex 11/30/2022:  Cycle 3 Day 1 of Dara/Rev/Dex 12/29/2022: Cycle 4 Day 1 of Dara/Rev/Dex 01/26/2023: Cycle 5 Day 1 of Dara/Rev/Dex 02/23/2023: Cycle 6 Day 1 of Dara/Rev/Dex 03/23/2023: Cycle 7 Day 1 of Dara/Rev/Dex 04/20/2023: Cycle 8 Day 1 of Dara/Rev/Dex 05/19/2023: Cycle 9 Day 1 of Dara/Rev/Dex 1/11-1/17/2025: mobilization and stem cell collection  06/15/2023: Cycle 10 Day 1 of Dara/Rev/Dex 07/14/2023: Cycle 11 Day 1 of  Dara/Rev/Dex  Interval History:  [name] 53 y.o. male for continued management of IgG kappa multiple myeloma. The patient's last visit was on 03/09/2023. In the interim, he continues on dara/rev/dex. He presents today for Cycle 11, Day 1 of treatment.   On exam today [name] reports he is waiting from the [facility] team to move forward transplant. He is otherwise feeling well. He does have some low back pain that is chronic in nature. He denies any changes to his appetite or energy. Otherwise that he has been at his baseline level of health and is willing and able to proceed with treatment at this time.  He denies fevers, chills, sweats, shortness of breath, chest pain, nausea, vomiting or bowel habit changes.  A full 10 point ROS was otherwise negative.  MEDICAL HISTORY:  Past Medical History:  Diagnosis Date   Allergy    Anemia    Atypical chest pain    Cancer (HCC)    Multiple myeloma with normocytic anemia   GERD (gastroesophageal reflux disease)    Hepatic cyst    Benign by MRI   Hiatal hernia    History of colonic polyps    Hyperlipidemia    Hypertension    Rectal bleeding    Tobacco abuse     SURGICAL HISTORY: Past Surgical History:  Procedure Laterality Date   COLONOSCOPY  01/10/2009     RMR: Normal rectum, normal colon; repeat in 2015 due to FH of colon cancer   COLONOSCOPY N/A 01/09/2014   NFA:OZHYQMVH colonic polyps-removed as described   COLONOSCOPY N/A 09/18/2016   Procedure: COLONOSCOPY;  Surgeon: Corbin Ade, MD;  Location: [facility];  Service: Endoscopy;  Laterality: N/A;  730    ESOPHAGOGASTRODUODENOSCOPY  10/04/2007   RMR: Distal esophageal erosions consistent with erosive reflux esophagitis, patulous gastroesophageal junction status post passage of a  Maloney dilator, 56 [place].  Otherwise, unremarkable esophagus.  Hiatal hernia.  Otherwise normal stomach.  Bulbar erosion   ESOPHAGOGASTRODUODENOSCOPY N/A 01/09/2014   Erosive reflux  esophagitis. Small hiatal hernia   HUMERUS IM NAIL Left 02/22/2022   Procedure: INTRAMEDULLARY (IM) NAIL HUMERAL;  Surgeon: Yolonda Kida, MD;  Location: [facility] OR;  Service: Orthopedics;  Laterality: Left;   I & D EXTREMITY Left 07/26/2018   Procedure: IRRIGATION AND DEBRIDEMENT EXTREMITY;  Surgeon: Knute Neu, MD;  Location: [facility] OR;  Service: Plastics;  Laterality: Left;   IR BONE TUMOR(S)RF ABLATION  02/05/2022   IR BONE TUMOR(S)RF ABLATION  02/24/2022   IR KYPHO EA ADDL LEVEL THORACIC OR LUMBAR  02/19/2022   IR KYPHO LUMBAR INC FX REDUCE BONE BX UNI/BIL CANNULATION INC/IMAGING  02/05/2022   IR KYPHO LUMBAR INC FX REDUCE BONE BX UNI/BIL CANNULATION INC/IMAGING  02/19/2022   PERCUTANEOUS PINNING Left 07/26/2018   Procedure: PERCUTANEOUS PINNING EXTREMITY;  Surgeon: Knute Neu, MD;  Location: [facility] OR;  Service: Plastics;  Laterality: Left;    SOCIAL HISTORY: Social History   Socioeconomic History   Marital status: Divorced    Spouse name: Not on file   Number of children: 2   Years of education: Not on file   Highest education level: Not on file  Occupational History   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[employer]    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[employer]  Tobacco Use   Smoking status: Former    Current packs/day: 0.00    Average packs/day: 0.3 packs/day for 10.0 years (2.5 ttl pk-yrs)    Types: Cigarettes    Start date: 12/25/2011    Quit date: 12/24/2021    Years since quitting: 1.5   Smokeless tobacco: Never  Vaping Use   Vaping status: Never Used  Substance and Sexual Activity   Alcohol use: Yes    Alcohol/week: 0.0 standard drinks of alcohol    Comment: occasional/wine on the weekends   Drug use: No   Sexual activity: Not on file  Other Topics Concern   Not on file  Social History Narrative   Right handed   Lives alone one story home   Social Drivers of Health   Financial Resource Strain: Low Risk  (02/13/2023)   Overall Financial Resource Strain (CARDIA)     Difficulty of Paying Living Expenses: Not very hard  Food Insecurity: Low Risk  (06/23/2023)   Received from [facility]   Hunger Vital Sign    Worried About Running Out of Food in the Last Year: Never true    Ran Out of Food in the Last Year: Never true  Transportation Needs: No Transportation Needs (06/23/2023)   Received from [facility]    In the past 12 months, has lack of reliable transportation kept you from medical appointments, meetings, work or from getting things needed for daily living? : No  Physical Activity: Inactive (02/13/2023)   Exercise Vital Sign    Days of Exercise per Week: 0 days    Minutes of Exercise per Session: 0 min  Stress: No Stress Concern Present (02/13/2023)   [employer] of Occupational Health - Occupational Stress Questionnaire    Feeling of Stress : Not at all  Social Connections: Unknown (02/13/2023)   Social Connection and  Isolation Panel [NHANES]    Frequency of Communication with Friends and Family: More than three times a week    Frequency of Social Gatherings with Friends and Family: More than three times a week    Attends Religious Services: More than 4 times per year    Active Member of [employer] or Organizations: No    Attends Banker Meetings: Never    Marital Status: Not on file  Intimate Partner Violence: Not At Risk (07/17/2022)   Humiliation, Afraid, Rape, and Kick questionnaire    Fear of Current or Ex-Partner: No    Emotionally Abused: No    Physically Abused: No    Sexually Abused: No    FAMILY HISTORY: Family History  Problem Relation Age of Onset   Heart disease Mother    Hypertension Mother    Diabetes Father    Hypertension Father    Colon cancer Sister        passed away from colon cancer, in her 55s   Heart attack Maternal Uncle    Prostate cancer Maternal Uncle    Diabetes Other    Pancreatic cancer Neg Hx    Rectal cancer Neg Hx    Esophageal cancer Neg Hx    Stomach cancer  Neg Hx     ALLERGIES:  has no known allergies.  MEDICATIONS:  Current Outpatient Medications  Medication Sig Dispense Refill   acyclovir (ZOVIRAX) 400 MG tablet Take 1 tablet by mouth twice daily 60 tablet 0   albuterol (VENTOLIN HFA) 108 (90 Base) MCG/ACT inhaler Inhale 2 puffs into the lungs every 6 (six) hours as needed for wheezing or shortness of breath. 8 g 0   aspirin EC 81 MG tablet Take 81 mg by mouth daily. Swallow whole.     cetirizine (ZYRTEC) 10 MG tablet Take 10 mg by mouth daily as needed for allergies.     chlorhexidine (PERIDEX) 0.12 % solution Swish 5 ml for 1 minute and spit out two times a day     clonazePAM (KLONOPIN) 0.5 MG tablet Take 1 tablet (0.5 mg total) by mouth 2 (two) times daily as needed for anxiety. 20 tablet 0   docusate sodium (COLACE) 100 MG capsule Take 1 capsule (100 mg total) by mouth 2 (two) times daily as needed for mild constipation. 30 capsule 3   DULoxetine (CYMBALTA) 20 MG capsule Take 1 capsule by mouth once daily 90 capsule 0   gabapentin (NEURONTIN) 100 MG capsule Take 1 capsule by mouth twice daily 60 capsule 0   hydrochlorothiazide (HYDRODIURIL) 25 MG tablet Take 1 tablet by mouth once daily 90 tablet 0   lisinopril (ZESTRIL) 40 MG tablet Take 1 tablet by mouth once daily 90 tablet 0   meloxicam (MOBIC) 15 MG tablet Take 1 tablet by mouth daily.     Methocarbamol 1000 MG TABS Take 1,000 mg by mouth every 6 (six) hours as needed. (Patient taking differently: Take 1,000 mg by mouth every 6 (six) hours as needed (muscle spasms).) 45 tablet 1   metoprolol tartrate (LOPRESSOR) 50 MG tablet 50 mg by oral route.     morphine (MS CONTIN) 30 MG 12 hr tablet Take 1 tablet (30 mg total) by mouth every 12 (twelve) hours. 60 tablet 0   Na Sulfate-K Sulfate-Mg Sulf 17.5-3.13-1.6 GM/177ML SOLN SMARTSIG:1 Kit(s) By Mouth Once     naloxone (NARCAN) nasal spray 4 mg/0.1 mL Call 911. Administer a single spray in one nostril. If no or minimal response after 2  to  3 minutes, an additional dose may be given in the alternate nostril.     nirmatrelvir & ritonavir (PAXLOVID, 300/100,) 20 x 150 MG & 10 x 100MG  TBPK TAKE 3 TABLETS TOGETHER (TWO 150 MG NIRMATRELVIR TABLETS AND ONE 100 MG RITONAVIR TABLET) BY MOUTH TWICE DAILY FOR 5 DAYS.     ondansetron (ZOFRAN) 8 MG tablet Take 1 tablet (8 mg total) by mouth every 8 (eight) hours as needed for nausea or vomiting. 60 tablet 3   pantoprazole (PROTONIX) 40 MG tablet Take 1 tablet by mouth twice daily 60 tablet 6   polyethylene glycol powder (GLYCOLAX/MIRALAX) 17 GM/SCOOP powder Take 1 capful (17 g) with water by mouth daily. (Patient taking differently: Take 17 g by mouth daily as needed for mild constipation.) 238 g 0   predniSONE (DELTASONE) 10 MG tablet See Admin Instructions.     prochlorperazine (COMPAZINE) 10 MG tablet Take 1 tablet (10 mg total) by mouth every 6 (six) hours as needed for nausea or vomiting. 60 tablet 0   REVLIMID 25 MG capsule Take one capsule daily for 14 days and then none for 7 days [employer] auth# [id number] obtained 07/03/23 14 capsule 0   rosuvastatin (CRESTOR) 10 MG tablet Take 1 tablet by mouth once daily 90 tablet 0   sildenafil (VIAGRA) 50 MG tablet TAKE 1 TABLET BY MOUTH ONCE DAILY AS NEEDED FOR ERECTILE DYSFUNCTION 10 tablet 0   Spacer/Aero-Holding Chambers (EQ SPACE CHAMBER ANTI-STATIC L) DEVI USE AS DIRECTED EVERY 4 HOURS AS NEEDED WITH  MDI 1 each 0   allopurinol (ZYLOPRIM) 300 MG tablet Take 1 tablet (300 mg total) by mouth daily. 30 tablet 3   No current facility-administered medications for this visit.   Facility-Administered Medications Ordered in Other Visits  Medication Dose Route Frequency Provider Last Rate Last Admin   daratumumab-hyaluronidase-fihj (DARZALEX FASPRO) 1800-30000 MG-UT/15ML chemo SQ injection 1,800 mg  1,800 mg Subcutaneous Once Jaci Standard, MD        REVIEW OF SYSTEMS:   Constitutional: ( - ) fevers, ( - )  chills , ( - ) night sweats Eyes: ( - )  blurriness of vision, ( - ) double vision, ( - ) watery eyes Ears, nose, mouth, throat, and face: ( - ) mucositis, ( - ) sore throat Respiratory: ( - ) cough, ( - ) dyspnea, ( - ) wheezes Cardiovascular: ( - ) palpitation, ( - ) chest discomfort, ( - ) lower extremity swelling Gastrointestinal:  ( - ) nausea, ( -) heartburn, ( - ) change in bowel habits Skin: ( - ) abnormal skin rashes Lymphatics: ( - ) new lymphadenopathy, ( - ) easy bruising Neurological: ( - ) numbness, ( - ) tingling, ( - ) new weaknesses Behavioral/Psych: ( - ) mood change, ( - ) new changes  All other systems were reviewed with the patient and are negative.  PHYSICAL EXAMINATION: ECOG PERFORMANCE STATUS: 1 - Symptomatic but completely ambulatory  Vitals:   07/14/23 1115  BP: 126/88  Pulse: 83  Resp: 16  Temp: (!) 97.2 F (36.2 C)  SpO2: 99%   Filed Weights   07/14/23 1115  Weight: 236 lb 3.2 oz (107.1 kg)    GENERAL: Well-appearing young African-American male, alert, no distress and comfortable SKIN: skin color, texture, turgor are normal, no rashes or significant lesions EYES: conjunctiva are pink and non-injected, sclera clear. LUNGS: clear to auscultation and percussion with normal breathing effort HEART: regular rate & rhythm and no murmurs  and no lower extremity edema Musculoskeletal: no cyanosis of digits and no clubbing  PSYCH: alert & oriented x 3, fluent speech NEURO: no focal motor/sensory deficits  LABORATORY DATA:  I have reviewed the data as listed    Latest Ref Rng & Units 07/14/2023   10:51 AM 06/15/2023    8:21 AM 05/19/2023    8:22 AM  CBC  WBC 4.0 - 10.5 K/uL 5.8  8.0  5.1   Hemoglobin 13.0 - 17.0 g/dL 04.5  40.9  81.1   Hematocrit 39.0 - 52.0 % 40.4  38.3  36.6   Platelets 150 - 400 K/uL 257  156  259        Latest Ref Rng & Units 07/14/2023   10:51 AM 06/15/2023    8:21 AM 05/19/2023    8:22 AM  CMP  Glucose 70 - 99 mg/dL 89  914  782   BUN 6 - 20 mg/dL 11  11  9     Creatinine 0.61 - 1.24 mg/dL 9.56  2.13  0.86   Sodium 135 - 145 mmol/L 137  135  138   Potassium 3.5 - 5.1 mmol/L 3.9  3.6  3.1   Chloride 98 - 111 mmol/L 102  99  102   CO2 22 - 32 mmol/L 29  26  26    Calcium 8.9 - 10.3 mg/dL 9.8  9.1  8.4   Total Protein 6.5 - 8.1 g/dL 7.1  7.0  6.6   Total Bilirubin 0.0 - 1.2 mg/dL 0.4  0.5  0.4   Alkaline Phos 38 - 126 U/L 72  94  49   AST 15 - 41 U/L 12  18  13    ALT 0 - 44 U/L 13  22  14      Lab Results  Component Value Date   MPROTEIN Not Observed 06/15/2023   MPROTEIN 0.1 (H) 05/19/2023   MPROTEIN Not Observed 04/22/2023   Lab Results  Component Value Date   KPAFRELGTCHN 10.7 06/15/2023   KPAFRELGTCHN 9.9 05/19/2023   KPAFRELGTCHN 10.5 04/22/2023   LAMBDASER 6.7 06/15/2023   LAMBDASER 6.2 05/19/2023   LAMBDASER 7.0 04/22/2023   KAPLAMBRATIO 1.60 06/15/2023   KAPLAMBRATIO 1.60 05/19/2023   KAPLAMBRATIO 1.50 04/22/2023   RADIOGRAPHIC STUDIES: No results found.  ASSESSMENT & PLAN [name] 53 y.o. male with medical history significant for IgG kappa multiple myeloma who presents for a follow up visit.   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 -- Recommend VRD chemotherapy with intention of proceeding to transplant. -- Labs at each visit to include CBC, CMP, LDH with monthly restaging labs SPEP and serum free light chains -- Started VRD therapy on 02/26/2022. Held Velcade therapy on 09/24/2022 due to development of ocular toxicity. --Switched to Dara/Rev/Dex on 10/02/2022 PLAN: --Due for Cycle 11, Day 1 of Dara/Rev/Dex.  --Labs today reviewed and adequate for treatment. WBC 5.8, hemoglobin 13.2, MCV 86.3, platelets 257 --Most recent myeloma labs from 06/15/2023 detected no M protein with normalized serum free light chain and ratio. --Proceed with treatment today without any dose  modifications.  -- Patient following with the bone marrow transplant unit of [facility].  Currently undergoing evaluation there.  Holding Xgeva per their request. --RTC in 4 weeks for next treatment (though anticipate this will be held for impending transplant)  #Erectile Dysfunction -- Likely  secondary to chemotherapy. --prescribed Viagra 50 mg p.o. as needed x 10 pills.  --Patient notes he is not getting the results he would like, encouraged him to double the dose to Viagra 100 mg p.o. to see if that is more effective. -- If ineffective could consider Cialis.  # Leg Discomfort/Restless Leg -- Patient provided with Requip by the emergency department, discontinued due to poor efficacy. --Evaluated by Dr. Barbaraann Cao on 07/28/2022. Recommended to short course of steroids so started prednisone 50 mg daily x 7 days. --Discussed etiologies including iron deficiency so iron levels were checked and was normal.   #Constipation--improving: --Recommend to continue on colace but add miralax as well.   #Pathologic fractures-secondary to MM: --Involving L1, L2 and L3 compression fracture and left humerus fracture.  --Underwent kyphoplasty of L3 fracture on 02/19/2022 --Underwent medullary nailing of left humeral shaft on 02/22/2022 --Underwent kyphoplasty on 06/04/2022.   #Pain Medication -- Per palliative care patient has weaned off MS contin. Currently conitnues on gabapentin 100 mg BID, cymbalta 20 mg and robaxin as needed.  --Under the care of palliative care team.   #Supportive Care -- chemotherapy education complete -- port placement not required -- zofran 8mg  q8H PRN and compazine 10mg  PO q6H for nausea -- acyclovir 400mg  PO BID for VCZ prophylaxis -- allopurinol 300mg  PO daily for TLS prophylaxis -- Dental clearance for Zometa/Denosumab approved.  HOLDING per [place] transplant request.   Orders Placed This Encounter  Procedures   IFE, Dara-Specific, Serum    Standing  Status:   Future    Expected Date:   08/10/2023    Expiration Date:   08/09/2024   CBC with Differential (Cancer Center Only)    Standing Status:   Future    Expected Date:   08/10/2023    Expiration Date:   08/09/2024   Multiple Myeloma Panel (SPEP&IFE w/QIG)    Standing Status:   Future    Expected Date:   08/10/2023    Expiration Date:   08/09/2024   Kappa/lambda light chains    Standing Status:   Future    Expected Date:   08/10/2023    Expiration Date:   08/09/2024   CMP (Cancer Center only)    Standing Status:   Future    Expected Date:   08/10/2023    Expiration Date:   08/09/2024    All questions were answered. The patient knows to call the clinic with any problems, questions or concerns.  I have spent a total of 30 minutes minutes of face-to-face and non-face-to-face time, preparing to see the patient,performing a medically appropriate examination, counseling and educating the patient, documenting clinical information in the electronic health record,  and care coordination.  Georga Kaufmann PA-C Dept of Hematology and Oncology [facility] Phone: [phone]

## 2023-07-15 ENCOUNTER — Other Ambulatory Visit: Payer: Self-pay | Admitting: Nurse Practitioner

## 2023-07-15 DIAGNOSIS — C9000 Multiple myeloma not having achieved remission: Secondary | ICD-10-CM

## 2023-07-15 DIAGNOSIS — G893 Neoplasm related pain (acute) (chronic): Secondary | ICD-10-CM

## 2023-07-15 DIAGNOSIS — Z515 Encounter for palliative care: Secondary | ICD-10-CM

## 2023-07-15 LAB — KAPPA/LAMBDA LIGHT CHAINS
Kappa free light chain: 9.6 mg/L (ref 3.3–19.4)
Kappa, lambda light chain ratio: 1.57 (ref 0.26–1.65)
Lambda free light chains: 6.1 mg/L (ref 5.7–26.3)

## 2023-07-17 ENCOUNTER — Encounter: Payer: Self-pay | Admitting: Physician Assistant

## 2023-07-21 ENCOUNTER — Other Ambulatory Visit: Payer: Self-pay | Admitting: Hematology and Oncology

## 2023-07-21 LAB — IFE, DARA-SPECIFIC, SERUM
IgA: 66 mg/dL — ABNORMAL LOW (ref 90–386)
IgG (Immunoglobin G), Serum: 609 mg/dL (ref 603–1613)
IgM (Immunoglobulin M), Srm: 32 mg/dL (ref 20–172)

## 2023-07-22 ENCOUNTER — Encounter: Payer: Self-pay | Admitting: Physician Assistant

## 2023-07-23 LAB — MULTIPLE MYELOMA PANEL, SERUM
Albumin SerPl Elph-Mcnc: 3.8 g/dL (ref 2.9–4.4)
Albumin/Glob SerPl: 1.3 (ref 0.7–1.7)
Alpha 1: 0.3 g/dL (ref 0.0–0.4)
Alpha2 Glob SerPl Elph-Mcnc: 1 g/dL (ref 0.4–1.0)
B-Globulin SerPl Elph-Mcnc: 1.2 g/dL (ref 0.7–1.3)
Gamma Glob SerPl Elph-Mcnc: 0.5 g/dL (ref 0.4–1.8)
Globulin, Total: 3 g/dL (ref 2.2–3.9)
IgA: 69 mg/dL — ABNORMAL LOW (ref 90–386)
IgG (Immunoglobin G), Serum: 598 mg/dL — ABNORMAL LOW (ref 603–1613)
IgM (Immunoglobulin M), Srm: 29 mg/dL (ref 20–172)
Total Protein ELP: 6.8 g/dL (ref 6.0–8.5)

## 2023-07-25 DIAGNOSIS — Z9484 Stem cells transplant status: Secondary | ICD-10-CM

## 2023-07-25 HISTORY — DX: Stem cells transplant status: Z94.84

## 2023-07-28 ENCOUNTER — Ambulatory Visit: Payer: BLUE CROSS/BLUE SHIELD | Admitting: Family Medicine

## 2023-07-31 ENCOUNTER — Other Ambulatory Visit: Payer: Self-pay | Admitting: Hematology and Oncology

## 2023-08-03 ENCOUNTER — Encounter: Payer: Self-pay | Admitting: Hematology and Oncology

## 2023-08-03 ENCOUNTER — Encounter: Payer: Self-pay | Admitting: Hematology

## 2023-08-11 NOTE — Progress Notes (Deleted)
 Palliative Medicine [facility]  [phone]) [phone] [phone]) [phone]   Name: [name] Date: 08/11/2023 MRN: [id number]  DOB: 1970/08/14  Patient Care Team: Georganna Skeans, MD as PCP - General (Family Medicine) Runell Gess, MD as PCP - Cardiology (Cardiology) Jena Gauss Gerrit Friends, MD as Attending Physician (Gastroenterology)   INTERVAL HISTORY: [name] is a 53 y.o. male with oncologic medical history including IgG Kappa Multiple Myeloma, L1, L2, and L3 vertebral compression fraction s/p kyphoplasty(9/27) and IM nailing of left humeral shaft (9/30), currently undergoing VRD therapy.  Palliative ask to see for symptom management.   SOCIAL HISTORY:     reports that he quit smoking about 19 months ago. His smoking use included cigarettes. He started smoking about 11 years ago. He has a 2.5 pack-year smoking history. He has never used smokeless tobacco. He reports current alcohol use. He reports that he does not use drugs.  ADVANCE DIRECTIVES:    CODE STATUS:   PAST MEDICAL HISTORY: Past Medical History:  Diagnosis Date   Allergy    Anemia    Atypical chest pain    Cancer (HCC)    Multiple myeloma with normocytic anemia   GERD (gastroesophageal reflux disease)    Hepatic cyst    Benign by MRI   Hiatal hernia    History of colonic polyps    Hyperlipidemia    Hypertension    Rectal bleeding    Tobacco abuse     ALLERGIES:  has no known allergies.  MEDICATIONS:  Current Outpatient Medications  Medication Sig Dispense Refill   acyclovir (ZOVIRAX) 400 MG tablet Take 1 tablet by mouth twice daily 60 tablet 0   albuterol (VENTOLIN HFA) 108 (90 Base) MCG/ACT inhaler Inhale 2 puffs into the lungs every 6 (six) hours as needed for wheezing or shortness of breath. 8 g 0   allopurinol (ZYLOPRIM) 300 MG tablet Take 1 tablet (300 mg total) by mouth daily. 30 tablet 3   aspirin EC 81 MG tablet Take 81 mg by mouth daily. Swallow whole.      cetirizine (ZYRTEC) 10 MG tablet Take 10 mg by mouth daily as needed for allergies.     chlorhexidine (PERIDEX) 0.12 % solution Swish 5 ml for 1 minute and spit out two times a day     clonazePAM (KLONOPIN) 0.5 MG tablet Take 1 tablet (0.5 mg total) by mouth 2 (two) times daily as needed for anxiety. 20 tablet 0   docusate sodium (COLACE) 100 MG capsule Take 1 capsule (100 mg total) by mouth 2 (two) times daily as needed for mild constipation. 30 capsule 3   DULoxetine (CYMBALTA) 20 MG capsule Take 1 capsule by mouth once daily 90 capsule 0   gabapentin (NEURONTIN) 100 MG capsule Take 1 capsule by mouth twice daily 60 capsule 0   hydrochlorothiazide (HYDRODIURIL) 25 MG tablet Take 1 tablet by mouth once daily 90 tablet 0   lisinopril (ZESTRIL) 40 MG tablet Take 1 tablet by mouth once daily 90 tablet 0   meloxicam (MOBIC) 15 MG tablet Take 1 tablet by mouth daily.     Methocarbamol 1000 MG TABS Take 1,000 mg by mouth every 6 (six) hours as needed. (Patient taking differently: Take 1,000 mg by mouth every 6 (six) hours as needed (muscle spasms).) 45 tablet 1   metoprolol tartrate (LOPRESSOR) 50 MG tablet 50 mg by oral route.     morphine (MS CONTIN) 30 MG 12 hr tablet Take 1  tablet (30 mg total) by mouth every 12 (twelve) hours. 60 tablet 0   Na Sulfate-K Sulfate-Mg Sulf 17.5-3.13-1.6 GM/177ML SOLN SMARTSIG:1 Kit(s) By Mouth Once     naloxone (NARCAN) nasal spray 4 mg/0.1 mL Call 911. Administer a single spray in one nostril. If no or minimal response after 2 to 3 minutes, an additional dose may be given in the alternate nostril.     nirmatrelvir & ritonavir (PAXLOVID, 300/100,) 20 x 150 MG & 10 x 100MG  TBPK TAKE 3 TABLETS TOGETHER (TWO 150 MG NIRMATRELVIR TABLETS AND ONE 100 MG RITONAVIR TABLET) BY MOUTH TWICE DAILY FOR 5 DAYS.     ondansetron (ZOFRAN) 8 MG tablet Take 1 tablet (8 mg total) by mouth every 8 (eight) hours as needed for nausea or vomiting. 60 tablet 3   pantoprazole (PROTONIX) 40 MG  tablet Take 1 tablet by mouth twice daily 60 tablet 6   polyethylene glycol powder (GLYCOLAX/MIRALAX) 17 GM/SCOOP powder Take 1 capful (17 g) with water by mouth daily. (Patient taking differently: Take 17 g by mouth daily as needed for mild constipation.) 238 g 0   predniSONE (DELTASONE) 10 MG tablet See Admin Instructions.     prochlorperazine (COMPAZINE) 10 MG tablet Take 1 tablet (10 mg total) by mouth every 6 (six) hours as needed for nausea or vomiting. 60 tablet 0   REVLIMID 25 MG capsule Take one capsule daily for 14 days and then none for 7 days [employer] auth# [id number] obtained 07/03/23 14 capsule 0   rosuvastatin (CRESTOR) 10 MG tablet Take 1 tablet by mouth once daily 90 tablet 0   sildenafil (VIAGRA) 50 MG tablet TAKE 1 TABLET BY MOUTH ONCE DAILY AS NEEDED FOR ERECTILE DYSFUNCTION 10 tablet 0   Spacer/Aero-Holding Chambers (EQ SPACE CHAMBER ANTI-STATIC L) DEVI USE AS DIRECTED EVERY 4 HOURS AS NEEDED WITH  MDI 1 each 0   No current facility-administered medications for this visit.    VITAL SIGNS: There were no vitals taken for this visit. There were no vitals filed for this visit.  Estimated body mass index is 34.88 kg/m as calculated from the following:   Height as of 04/29/23: 5\' 9"  (1.753 m).   Weight as of 07/14/23: 236 lb 3.2 oz (107.1 kg).   PERFORMANCE STATUS (ECOG) : 1 - Symptomatic but completely ambulatory  Assessment NAD, ambulatory RRR Normal breathing pattern AAO x4  IMPRESSION: I saw [name] during his infusion. No acute distress. Denies nausea, vomiting, constipation, or diarrhea. Appetite is good. He is doing well overall. No concerns mentioned. Remains active as possible.   [name] reports pain is controlled. Some days are better than others. When he does have pain it is in his lower back. He is remaining active as possible while also listening to his body and taking rest breaks as needed. Taking Gabapentin twice daily for his neuropathic pain.   No symptom  management needs at this time. No adjustments to medication regimen.   Overall [name] is much improved.  Appreciative of where he is in his health journey.  He knows to contact our office as needed with any concerns.  PLAN:  Gabapentin 100 mg twice daily Klonopin daily as needed for anxiety/sleep Zofran and Compazine as needed for nausea Miralax daily for constipation I will plan to see patient back in 6-8 weeks in collaboration with other oncology appointments.  Patient knows to contact office sooner if needed.   Patient expressed understanding and was in agreement with this plan. He also understands  that He can call the clinic at any time with any questions, concerns, or complaints.    Any controlled substances utilized were prescribed in the context of palliative care. PDMP has been reviewed.    Visit consisted of counseling and education dealing with the complex and emotionally intense issues of symptom management and palliative care in the setting of serious and potentially life-threatening illness.  Willette Alma, AGPCNP-BC  Palliative Medicine Team/[facility]

## 2023-08-12 ENCOUNTER — Telehealth: Payer: Self-pay | Admitting: *Deleted

## 2023-08-12 ENCOUNTER — Inpatient Hospital Stay: Payer: BLUE CROSS/BLUE SHIELD

## 2023-08-12 ENCOUNTER — Inpatient Hospital Stay: Payer: BLUE CROSS/BLUE SHIELD | Admitting: Hematology and Oncology

## 2023-08-12 ENCOUNTER — Inpatient Hospital Stay: Payer: BLUE CROSS/BLUE SHIELD | Admitting: Nurse Practitioner

## 2023-08-12 ENCOUNTER — Ambulatory Visit: Payer: BLUE CROSS/BLUE SHIELD

## 2023-08-12 NOTE — Telephone Encounter (Signed)
 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 Spoke with him. He states he is feeling ok. He is currently in-patient @ [facility] for blood clot. He is around day 5-7 post transplant. He is aware that we will be seeing him again as he progresses. He was appreciative of the call. Appts for today have been cancelled

## 2023-08-12 NOTE — Progress Notes (Deleted)
 [facility] [phone]   [phone]  PROGRESS NOTE  Patient Care Team: Georganna Skeans, MD as PCP - General (Family Medicine) Runell Gess, MD as PCP - Cardiology (Cardiology) Jena Gauss Gerrit Friends, MD as Attending Physician (Gastroenterology)  Hematological/Oncological History # IgG Kappa Multiple Myeloma, 1p32/1q21.  01/04/2022: MRI lumbar spine shows diffuse heterogeneous marrow signal with heterogeneous enhancement throughout the thoracolumbar spine.  Additionally, there is subacute-chronic L3 vertebral body compression fraction. 01/14/2022: establish care with Dr. Leonides Schanz  02/10/2022: bone marrow biopsy shows range of plasma cell involvement from 50% on the biopsy to 60% on aspirate smear slides and close to 90% on the clot section for an overall percentage estimated at approximately 80% overall. 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  10/02/2022: Cycle 1 Day 1 of Dara/Rev/Dex 11/03/2022: Cycle 2 Day 1 of Dara/Rev/Dex 11/30/2022:  Cycle 3 Day 1 of Dara/Rev/Dex 12/29/2022: Cycle 4 Day 1 of Dara/Rev/Dex 01/26/2023: Cycle 5 Day 1 of Dara/Rev/Dex 02/23/2023: Cycle 6 Day 1 of Dara/Rev/Dex 03/23/2023: Cycle 7 Day 1 of Dara/Rev/Dex 04/20/2023: Cycle 8 Day 1 of Dara/Rev/Dex 05/19/2023: Cycle 9 Day 1 of Dara/Rev/Dex 1/11-1/17/2025: mobilization and stem cell collection  06/15/2023: Cycle 10 Day 1 of Dara/Rev/Dex 07/14/2023: Cycle 11 Day 1 of  Dara/Rev/Dex  Interval History:  [name] 53 y.o. male for continued management of IgG kappa multiple myeloma. The patient's last visit was on 03/09/2023. In the interim, he continues on dara/rev/dex. He presents today for Cycle 11, Day 1 of treatment.   On exam today [name] reports he is waiting from the [facility] team to move forward transplant. He is otherwise feeling well. He does have some low back pain that is chronic in nature. He denies any changes to his appetite or energy. Otherwise that he has been at his baseline level of health and is willing and able to proceed with treatment at this time.  He denies fevers, chills, sweats, shortness of breath, chest pain, nausea, vomiting or bowel habit changes.  A full 10 point ROS was otherwise negative.  MEDICAL HISTORY:  Past Medical History:  Diagnosis Date   Allergy    Anemia    Atypical chest pain    Cancer (HCC)    Multiple myeloma with normocytic anemia   GERD (gastroesophageal reflux disease)    Hepatic cyst    Benign by MRI   Hiatal hernia    History of colonic polyps    Hyperlipidemia    Hypertension    Rectal bleeding    Tobacco abuse     SURGICAL HISTORY: Past Surgical History:  Procedure Laterality Date   COLONOSCOPY  01/10/2009     RMR: Normal rectum, normal colon; repeat in 2015 due to FH of colon cancer   COLONOSCOPY N/A 01/09/2014   YQM:VHQIONGE colonic polyps-removed as described   COLONOSCOPY N/A 09/18/2016   Procedure: COLONOSCOPY;  Surgeon: Corbin Ade, MD;  Location: [facility];  Service: Endoscopy;  Laterality: N/A;  730    ESOPHAGOGASTRODUODENOSCOPY  10/04/2007   RMR: Distal esophageal erosions consistent with erosive reflux esophagitis, patulous gastroesophageal junction status post passage of a  Maloney dilator, 56 [place].  Otherwise, unremarkable esophagus.  Hiatal hernia.  Otherwise normal stomach.  Bulbar erosion   ESOPHAGOGASTRODUODENOSCOPY N/A 01/09/2014   Erosive reflux  esophagitis. Small hiatal hernia   HUMERUS IM NAIL Left 02/22/2022   Procedure: INTRAMEDULLARY (IM) NAIL HUMERAL;  Surgeon: Yolonda Kida, MD;  Location: [facility] OR;  Service: Orthopedics;  Laterality: Left;   I & D EXTREMITY Left 07/26/2018   Procedure: IRRIGATION AND DEBRIDEMENT EXTREMITY;  Surgeon: Knute Neu, MD;  Location: [facility] OR;  Service: Plastics;  Laterality: Left;   IR BONE TUMOR(S)RF ABLATION  02/05/2022   IR BONE TUMOR(S)RF ABLATION  02/24/2022   IR KYPHO EA ADDL LEVEL THORACIC OR LUMBAR  02/19/2022   IR KYPHO LUMBAR INC FX REDUCE BONE BX UNI/BIL CANNULATION INC/IMAGING  02/05/2022   IR KYPHO LUMBAR INC FX REDUCE BONE BX UNI/BIL CANNULATION INC/IMAGING  02/19/2022   PERCUTANEOUS PINNING Left 07/26/2018   Procedure: PERCUTANEOUS PINNING EXTREMITY;  Surgeon: Knute Neu, MD;  Location: [facility] OR;  Service: Plastics;  Laterality: Left;    SOCIAL HISTORY: Social History   Socioeconomic History   Marital status: Divorced    Spouse name: Not on file   Number of children: 2   Years of education: Not on file   Highest education level: Not on file  Occupational History   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[employer]    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[employer]  Tobacco Use   Smoking status: Former    Current packs/day: 0.00    Average packs/day: 0.3 packs/day for 10.0 years (2.5 ttl pk-yrs)    Types: Cigarettes    Start date: 12/25/2011    Quit date: 12/24/2021    Years since quitting: 1.6   Smokeless tobacco: Never  Vaping Use   Vaping status: Never Used  Substance and Sexual Activity   Alcohol use: Yes    Alcohol/week: 0.0 standard drinks of alcohol    Comment: occasional/wine on the weekends   Drug use: No   Sexual activity: Not on file  Other Topics Concern   Not on file  Social History Narrative   Right handed   Lives alone one story home   Social Drivers of Health   Financial Resource Strain: Low Risk  (02/13/2023)   Overall Financial Resource Strain (CARDIA)     Difficulty of Paying Living Expenses: Not very hard  Food Insecurity: Low Risk  (08/09/2023)   Received from [facility]   Hunger Vital Sign    Worried About Running Out of Food in the Last Year: Never true    Ran Out of Food in the Last Year: Never true  Transportation Needs: No Transportation Needs (08/09/2023)   Received from [facility]    In the past 12 months, has lack of reliable transportation kept you from medical appointments, meetings, work or from getting things needed for daily living? : No  Physical Activity: Inactive (02/13/2023)   Exercise Vital Sign    Days of Exercise per Week: 0 days    Minutes of Exercise per Session: 0 min  Stress: No Stress Concern Present (02/13/2023)   [employer] of Occupational Health - Occupational Stress Questionnaire    Feeling of Stress : Not at all  Social Connections: Unknown (02/13/2023)   Social Connection and  Isolation Panel [NHANES]    Frequency of Communication with Friends and Family: More than three times a week    Frequency of Social Gatherings with Friends and Family: More than three times a week    Attends Religious Services: More than 4 times per year    Active Member of [employer] or Organizations: No    Attends Banker Meetings: Never    Marital Status: Not on file  Intimate Partner Violence: Not At Risk (07/17/2022)   Humiliation, Afraid, Rape, and Kick questionnaire    Fear of Current or Ex-Partner: No    Emotionally Abused: No    Physically Abused: No    Sexually Abused: No    FAMILY HISTORY: Family History  Problem Relation Age of Onset   Heart disease Mother    Hypertension Mother    Diabetes Father    Hypertension Father    Colon cancer Sister        passed away from colon cancer, in her 44s   Heart attack Maternal Uncle    Prostate cancer Maternal Uncle    Diabetes Other    Pancreatic cancer Neg Hx    Rectal cancer Neg Hx    Esophageal cancer Neg Hx    Stomach cancer  Neg Hx     ALLERGIES:  has no known allergies.  MEDICATIONS:  Current Outpatient Medications  Medication Sig Dispense Refill   acyclovir (ZOVIRAX) 400 MG tablet Take 1 tablet by mouth twice daily 60 tablet 0   albuterol (VENTOLIN HFA) 108 (90 Base) MCG/ACT inhaler Inhale 2 puffs into the lungs every 6 (six) hours as needed for wheezing or shortness of breath. 8 g 0   allopurinol (ZYLOPRIM) 300 MG tablet Take 1 tablet (300 mg total) by mouth daily. 30 tablet 3   aspirin EC 81 MG tablet Take 81 mg by mouth daily. Swallow whole.     cetirizine (ZYRTEC) 10 MG tablet Take 10 mg by mouth daily as needed for allergies.     chlorhexidine (PERIDEX) 0.12 % solution Swish 5 ml for 1 minute and spit out two times a day     clonazePAM (KLONOPIN) 0.5 MG tablet Take 1 tablet (0.5 mg total) by mouth 2 (two) times daily as needed for anxiety. 20 tablet 0   docusate sodium (COLACE) 100 MG capsule Take 1 capsule (100 mg total) by mouth 2 (two) times daily as needed for mild constipation. 30 capsule 3   DULoxetine (CYMBALTA) 20 MG capsule Take 1 capsule by mouth once daily 90 capsule 0   gabapentin (NEURONTIN) 100 MG capsule Take 1 capsule by mouth twice daily 60 capsule 0   hydrochlorothiazide (HYDRODIURIL) 25 MG tablet Take 1 tablet by mouth once daily 90 tablet 0   lisinopril (ZESTRIL) 40 MG tablet Take 1 tablet by mouth once daily 90 tablet 0   meloxicam (MOBIC) 15 MG tablet Take 1 tablet by mouth daily.     Methocarbamol 1000 MG TABS Take 1,000 mg by mouth every 6 (six) hours as needed. (Patient taking differently: Take 1,000 mg by mouth every 6 (six) hours as needed (muscle spasms).) 45 tablet 1   metoprolol tartrate (LOPRESSOR) 50 MG tablet 50 mg by oral route.     morphine (MS CONTIN) 30 MG 12 hr tablet Take 1 tablet (30 mg total) by mouth every 12 (twelve) hours. 60 tablet 0   Na Sulfate-K Sulfate-Mg Sulf 17.5-3.13-1.6 GM/177ML SOLN SMARTSIG:1 Kit(s) By Mouth Once     naloxone (NARCAN) nasal spray  4  mg/0.1 mL Call 911. Administer a single spray in one nostril. If no or minimal response after 2 to 3 minutes, an additional dose may be given in the alternate nostril.     nirmatrelvir & ritonavir (PAXLOVID, 300/100,) 20 x 150 MG & 10 x 100MG  TBPK TAKE 3 TABLETS TOGETHER (TWO 150 MG NIRMATRELVIR TABLETS AND ONE 100 MG RITONAVIR TABLET) BY MOUTH TWICE DAILY FOR 5 DAYS.     ondansetron (ZOFRAN) 8 MG tablet Take 1 tablet (8 mg total) by mouth every 8 (eight) hours as needed for nausea or vomiting. 60 tablet 3   pantoprazole (PROTONIX) 40 MG tablet Take 1 tablet by mouth twice daily 60 tablet 6   polyethylene glycol powder (GLYCOLAX/MIRALAX) 17 GM/SCOOP powder Take 1 capful (17 g) with water by mouth daily. (Patient taking differently: Take 17 g by mouth daily as needed for mild constipation.) 238 g 0   predniSONE (DELTASONE) 10 MG tablet See Admin Instructions.     prochlorperazine (COMPAZINE) 10 MG tablet Take 1 tablet (10 mg total) by mouth every 6 (six) hours as needed for nausea or vomiting. 60 tablet 0   REVLIMID 25 MG capsule Take one capsule daily for 14 days and then none for 7 days [employer] auth# [id number] obtained 07/03/23 14 capsule 0   rosuvastatin (CRESTOR) 10 MG tablet Take 1 tablet by mouth once daily 90 tablet 0   sildenafil (VIAGRA) 50 MG tablet TAKE 1 TABLET BY MOUTH ONCE DAILY AS NEEDED FOR ERECTILE DYSFUNCTION 10 tablet 0   Spacer/Aero-Holding Chambers (EQ SPACE CHAMBER ANTI-STATIC L) DEVI USE AS DIRECTED EVERY 4 HOURS AS NEEDED WITH  MDI 1 each 0   No current facility-administered medications for this visit.    REVIEW OF SYSTEMS:   Constitutional: ( - ) fevers, ( - )  chills , ( - ) night sweats Eyes: ( - ) blurriness of vision, ( - ) double vision, ( - ) watery eyes Ears, nose, mouth, throat, and face: ( - ) mucositis, ( - ) sore throat Respiratory: ( - ) cough, ( - ) dyspnea, ( - ) wheezes Cardiovascular: ( - ) palpitation, ( - ) chest discomfort, ( - ) lower extremity  swelling Gastrointestinal:  ( - ) nausea, ( -) heartburn, ( - ) change in bowel habits Skin: ( - ) abnormal skin rashes Lymphatics: ( - ) new lymphadenopathy, ( - ) easy bruising Neurological: ( - ) numbness, ( - ) tingling, ( - ) new weaknesses Behavioral/Psych: ( - ) mood change, ( - ) new changes  All other systems were reviewed with the patient and are negative.  PHYSICAL EXAMINATION: ECOG PERFORMANCE STATUS: 1 - Symptomatic but completely ambulatory  There were no vitals filed for this visit.  There were no vitals filed for this visit.   GENERAL: Well-appearing young African-American male, alert, no distress and comfortable SKIN: skin color, texture, turgor are normal, no rashes or significant lesions EYES: conjunctiva are pink and non-injected, sclera clear. LUNGS: clear to auscultation and percussion with normal breathing effort HEART: regular rate & rhythm and no murmurs and no lower extremity edema Musculoskeletal: no cyanosis of digits and no clubbing  PSYCH: alert & oriented x 3, fluent speech NEURO: no focal motor/sensory deficits  LABORATORY DATA:  I have reviewed the data as listed    Latest Ref Rng & Units 07/14/2023   10:51 AM 06/15/2023    8:21 AM 05/19/2023    8:22 AM  CBC  WBC 4.0 -  10.5 K/uL 5.8  8.0  5.1   Hemoglobin 13.0 - 17.0 g/dL 04.5  40.9  81.1   Hematocrit 39.0 - 52.0 % 40.4  38.3  36.6   Platelets 150 - 400 K/uL 257  156  259        Latest Ref Rng & Units 07/14/2023   10:51 AM 06/15/2023    8:21 AM 05/19/2023    8:22 AM  CMP  Glucose 70 - 99 mg/dL 89  914  782   BUN 6 - 20 mg/dL 11  11  9    Creatinine 0.61 - 1.24 mg/dL 9.56  2.13  0.86   Sodium 135 - 145 mmol/L 137  135  138   Potassium 3.5 - 5.1 mmol/L 3.9  3.6  3.1   Chloride 98 - 111 mmol/L 102  99  102   CO2 22 - 32 mmol/L 29  26  26    Calcium 8.9 - 10.3 mg/dL 9.8  9.1  8.4   Total Protein 6.5 - 8.1 g/dL 7.1  7.0  6.6   Total Bilirubin 0.0 - 1.2 mg/dL 0.4  0.5  0.4   Alkaline Phos 38  - 126 U/L 72  94  49   AST 15 - 41 U/L 12  18  13    ALT 0 - 44 U/L 13  22  14      Lab Results  Component Value Date   MPROTEIN Not Observed 07/14/2023   MPROTEIN Not Observed 06/15/2023   MPROTEIN 0.1 (H) 05/19/2023   Lab Results  Component Value Date   KPAFRELGTCHN 9.6 07/14/2023   KPAFRELGTCHN 10.7 06/15/2023   KPAFRELGTCHN 9.9 05/19/2023   LAMBDASER 6.1 07/14/2023   LAMBDASER 6.7 06/15/2023   LAMBDASER 6.2 05/19/2023   KAPLAMBRATIO 1.57 07/14/2023   KAPLAMBRATIO 1.60 06/15/2023   KAPLAMBRATIO 1.60 05/19/2023   RADIOGRAPHIC STUDIES: No results found.  ASSESSMENT & PLAN [name] 53 y.o. male with medical history significant for IgG kappa multiple myeloma who presents for a follow up visit.   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 -- Recommend VRD chemotherapy with intention of proceeding to transplant. -- Labs at each visit to include CBC, CMP, LDH with monthly restaging labs SPEP and serum free light chains -- Started VRD therapy on 02/26/2022. Held Velcade therapy on 09/24/2022 due to development of ocular toxicity. --Switched to Dara/Rev/Dex on 10/02/2022 PLAN: --HOLD Dara/Rev/Dex in setting of impending transplant.  --Labs today reviewed and adequate for treatment. WBC *** --Most recent myeloma labs from 06/15/2023 detected no M protein with normalized serum free light chain and ratio. --Proceed with treatment today without any dose modifications.  -- Patient following with the bone marrow transplant unit of [facility].  Currently undergoing evaluation there.  Holding Xgeva per their request. --RTC in 4 weeks for next treatment (though anticipate this will be held for impending transplant)  #Erectile Dysfunction -- Likely secondary to chemotherapy. --prescribed Viagra 50 mg p.o. as needed x 10  pills.  --Patient notes he is not getting the results he would like, encouraged him to double the dose to Viagra 100 mg p.o. to see if that is more effective. -- If ineffective could consider Cialis.  # Leg Discomfort/Restless Leg -- Patient provided with Requip by the emergency department, discontinued due to poor efficacy. --Evaluated by Dr. Barbaraann Cao on 07/28/2022. Recommended to  short course of steroids so started prednisone 50 mg daily x 7 days. --Discussed etiologies including iron deficiency so iron levels were checked and was normal.   #Constipation--improving: --Recommend to continue on colace but add miralax as well.   #Pathologic fractures-secondary to MM: --Involving L1, L2 and L3 compression fracture and left humerus fracture.  --Underwent kyphoplasty of L3 fracture on 02/19/2022 --Underwent medullary nailing of left humeral shaft on 02/22/2022 --Underwent kyphoplasty on 06/04/2022.   #Pain Medication -- Per palliative care patient has weaned off MS contin. Currently conitnues on gabapentin 100 mg BID, cymbalta 20 mg and robaxin as needed.  --Under the care of palliative care team.   #Supportive Care -- chemotherapy education complete -- port placement not required -- zofran 8mg  q8H PRN and compazine 10mg  PO q6H for nausea -- acyclovir 400mg  PO BID for VCZ prophylaxis -- allopurinol 300mg  PO daily for TLS prophylaxis -- Dental clearance for Zometa/Denosumab approved.  HOLDING per [facility] transplant request.   No orders of the defined types were placed in this encounter.   All questions were answered. The patient knows to call the clinic with any problems, questions or concerns.  I have spent a total of 30 minutes minutes of face-to-face and non-face-to-face time, preparing to see the patient,performing a medically appropriate examination, counseling and educating the patient, documenting clinical information in the electronic health record,  and care coordination.  Ulysees Barns, MD Department of Hematology/Oncology [facility] Phone: [phone] Pager: [phone] Email: [email or web address]

## 2023-08-20 ENCOUNTER — Telehealth: Payer: Self-pay | Admitting: Hematology and Oncology

## 2023-08-20 NOTE — Telephone Encounter (Signed)
 Call was transferred to Nurse on duty.

## 2023-08-25 ENCOUNTER — Ambulatory Visit (INDEPENDENT_AMBULATORY_CARE_PROVIDER_SITE_OTHER): Payer: BLUE CROSS/BLUE SHIELD | Admitting: Family Medicine

## 2023-08-25 ENCOUNTER — Encounter: Payer: Self-pay | Admitting: Family Medicine

## 2023-08-25 VITALS — BP 109/78 | HR 109 | Temp 98.2°F | Resp 16 | Ht 69.0 in | Wt 234.6 lb

## 2023-08-25 DIAGNOSIS — I10 Essential (primary) hypertension: Secondary | ICD-10-CM

## 2023-08-25 DIAGNOSIS — F32A Depression, unspecified: Secondary | ICD-10-CM

## 2023-08-25 DIAGNOSIS — F419 Anxiety disorder, unspecified: Secondary | ICD-10-CM

## 2023-08-25 DIAGNOSIS — E782 Mixed hyperlipidemia: Secondary | ICD-10-CM | POA: Diagnosis not present

## 2023-08-25 NOTE — Progress Notes (Unsigned)
 Established Patient Office Visit  Subjective    Patient ID: HAPPY [place], male    DOB: 1970-08-30  Age: 53 y.o. MRN: [id number]  CC:  Chief Complaint  Patient presents with   Follow-up    HPI [name] presents for routine follow up of chronic med issues including hypertension and anxiety/depression. Patient reports med compliance and denies acute complaints.   Outpatient Encounter Medications as of 08/25/2023  Medication Sig   acyclovir (ZOVIRAX) 400 MG tablet Take 1 tablet by mouth twice daily   gabapentin (NEURONTIN) 100 MG capsule Take 1 capsule by mouth twice daily   Multiple Vitamin (MULTIVITAMIN) tablet Take 1 tablet by mouth daily.   ondansetron (ZOFRAN) 8 MG tablet Take 1 tablet (8 mg total) by mouth every 8 (eight) hours as needed for nausea or vomiting.   pantoprazole (PROTONIX) 40 MG tablet Take 1 tablet by mouth twice daily   sildenafil (VIAGRA) 50 MG tablet TAKE 1 TABLET BY MOUTH ONCE DAILY AS NEEDED FOR ERECTILE DYSFUNCTION   albuterol (VENTOLIN HFA) 108 (90 Base) MCG/ACT inhaler Inhale 2 puffs into the lungs every 6 (six) hours as needed for wheezing or shortness of breath. (Patient not taking: Reported on 08/25/2023)   allopurinol (ZYLOPRIM) 300 MG tablet Take 1 tablet (300 mg total) by mouth daily. (Patient not taking: Reported on 08/25/2023)   aspirin EC 81 MG tablet Take 81 mg by mouth daily. Swallow whole. (Patient not taking: Reported on 08/25/2023)   cetirizine (ZYRTEC) 10 MG tablet Take 10 mg by mouth daily as needed for allergies. (Patient not taking: Reported on 08/25/2023)   chlorhexidine (PERIDEX) 0.12 % solution Swish 5 ml for 1 minute and spit out two times a day (Patient not taking: Reported on 08/25/2023)   clonazePAM (KLONOPIN) 0.5 MG tablet Take 1 tablet (0.5 mg total) by mouth 2 (two) times daily as needed for anxiety. (Patient not taking: Reported on 08/25/2023)   docusate sodium (COLACE) 100 MG capsule Take 1 capsule (100 mg total) by mouth 2 (two)  times daily as needed for mild constipation. (Patient not taking: Reported on 08/25/2023)   DULoxetine (CYMBALTA) 20 MG capsule Take 1 capsule by mouth once daily (Patient not taking: Reported on 08/25/2023)   hydrochlorothiazide (HYDRODIURIL) 25 MG tablet Take 1 tablet by mouth once daily (Patient not taking: Reported on 08/25/2023)   lisinopril (ZESTRIL) 40 MG tablet Take 1 tablet by mouth once daily (Patient not taking: Reported on 08/25/2023)   meloxicam (MOBIC) 15 MG tablet Take 1 tablet by mouth daily. (Patient not taking: Reported on 08/25/2023)   Methocarbamol 1000 MG TABS Take 1,000 mg by mouth every 6 (six) hours as needed. (Patient not taking: Reported on 08/25/2023)   metoprolol tartrate (LOPRESSOR) 50 MG tablet 50 mg by oral route. (Patient not taking: Reported on 08/25/2023)   morphine (MS CONTIN) 30 MG 12 hr tablet Take 1 tablet (30 mg total) by mouth every 12 (twelve) hours. (Patient not taking: Reported on 08/25/2023)   Na Sulfate-K Sulfate-Mg Sulf 17.5-3.13-1.6 GM/177ML SOLN SMARTSIG:1 Kit(s) By Mouth Once (Patient not taking: Reported on 08/25/2023)   naloxone (NARCAN) nasal spray 4 mg/0.1 mL Call 911. Administer a single spray in one nostril. If no or minimal response after 2 to 3 minutes, an additional dose may be given in the alternate nostril. (Patient not taking: Reported on 08/25/2023)   nirmatrelvir & ritonavir (PAXLOVID, 300/100,) 20 x 150 MG & 10 x 100MG  TBPK TAKE 3 TABLETS TOGETHER (TWO 150 MG NIRMATRELVIR  TABLETS AND ONE 100 MG RITONAVIR TABLET) BY MOUTH TWICE DAILY FOR 5 DAYS. (Patient not taking: Reported on 08/25/2023)   polyethylene glycol powder (GLYCOLAX/MIRALAX) 17 GM/SCOOP powder Take 1 capful (17 g) with water by mouth daily. (Patient not taking: Reported on 08/25/2023)   predniSONE (DELTASONE) 10 MG tablet See Admin Instructions. (Patient not taking: Reported on 08/25/2023)   prochlorperazine (COMPAZINE) 10 MG tablet Take 1 tablet (10 mg total) by mouth every 6 (six) hours as needed for  nausea or vomiting. (Patient not taking: Reported on 08/25/2023)   REVLIMID 25 MG capsule Take one capsule daily for 14 days and then none for 7 days [employer] auth# [id number] obtained 07/03/23 (Patient not taking: Reported on 08/25/2023)   rosuvastatin (CRESTOR) 10 MG tablet Take 1 tablet by mouth once daily (Patient not taking: Reported on 08/25/2023)   Spacer/Aero-Holding Chambers (EQ SPACE CHAMBER ANTI-STATIC L) DEVI USE AS DIRECTED EVERY 4 HOURS AS NEEDED WITH  MDI (Patient not taking: Reported on 08/25/2023)   No facility-administered encounter medications on file as of 08/25/2023.    Past Medical History:  Diagnosis Date   Allergy    Anemia    Atypical chest pain    Cancer (HCC)    Multiple myeloma with normocytic anemia   GERD (gastroesophageal reflux disease)    Hepatic cyst    Benign by MRI   Hiatal hernia    History of colonic polyps    Hyperlipidemia    Hypertension    Rectal bleeding    Tobacco abuse     Past Surgical History:  Procedure Laterality Date   COLONOSCOPY  01/10/2009     RMR: Normal rectum, normal colon; repeat in 2015 due to FH of colon cancer   COLONOSCOPY N/A 01/09/2014   NGE:[id number] colonic polyps-removed as described   COLONOSCOPY N/A 09/18/2016   Procedure: COLONOSCOPY;  Surgeon: Corbin Ade, MD;  Location: [facility];  Service: Endoscopy;  Laterality: N/A;  730    ESOPHAGOGASTRODUODENOSCOPY  10/04/2007   RMR: Distal esophageal erosions consistent with erosive reflux esophagitis, patulous gastroesophageal junction status post passage of a  Maloney dilator, 56 [place].  Otherwise, unremarkable esophagus.  Hiatal hernia.  Otherwise normal stomach.  Bulbar erosion   ESOPHAGOGASTRODUODENOSCOPY N/A 01/09/2014   Erosive reflux esophagitis. Small hiatal hernia   HUMERUS IM NAIL Left 02/22/2022   Procedure: INTRAMEDULLARY (IM) NAIL HUMERAL;  Surgeon: Yolonda Kida, MD;  [facility];  Service: Orthopedics;  Laterality: Left;   I & D EXTREMITY Left  07/26/2018   Procedure: IRRIGATION AND DEBRIDEMENT EXTREMITY;  Surgeon: Knute Neu, MD;  Location: [facility] OR;  Service: Plastics;  Laterality: Left;   IR BONE TUMOR(S)RF ABLATION  02/05/2022   IR BONE TUMOR(S)RF ABLATION  02/24/2022   IR KYPHO EA ADDL LEVEL THORACIC OR LUMBAR  02/19/2022   IR KYPHO LUMBAR INC FX REDUCE BONE BX UNI/BIL CANNULATION INC/IMAGING  02/05/2022   IR KYPHO LUMBAR INC FX REDUCE BONE BX UNI/BIL CANNULATION INC/IMAGING  02/19/2022   PERCUTANEOUS PINNING Left 07/26/2018   Procedure: PERCUTANEOUS PINNING EXTREMITY;  Surgeon: Knute Neu, MD;  Location: [facility] OR;  Service: Plastics;  Laterality: Left;    Family History  Problem Relation Age of Onset   Heart disease Mother    Hypertension Mother    Diabetes Father    Hypertension Father    Colon cancer Sister        passed away from colon cancer, in her 85s   Heart attack Maternal Uncle  Prostate cancer Maternal Uncle    Diabetes Other    Pancreatic cancer Neg Hx    Rectal cancer Neg Hx    Esophageal cancer Neg Hx    Stomach cancer Neg Hx     Social History   Socioeconomic History   Marital status: Divorced    Spouse name: Not on file   Number of children: 2   Years of education: Not on file   Highest education level: Not on file  Occupational History   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[employer]    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[employer]  Tobacco Use   Smoking status: Former    Current packs/day: 0.00    Average packs/day: 0.3 packs/day for 10.0 years (2.5 ttl pk-yrs)    Types: Cigarettes    Start date: 12/25/2011    Quit date: 12/24/2021    Years since quitting: 1.6   Smokeless tobacco: Never  Vaping Use   Vaping status: Never Used  Substance and Sexual Activity   Alcohol use: Yes    Alcohol/week: 0.0 standard drinks of alcohol    Comment: occasional/wine on the weekends   Drug use: No   Sexual activity: Not on file  Other Topics Concern   Not on file  Social History Narrative   Right  handed   Lives alone one story home   Social Drivers of Health   Financial Resource Strain: Low Risk  (02/13/2023)   Overall Financial Resource Strain (CARDIA)    Difficulty of Paying Living Expenses: Not very hard  Food Insecurity: Low Risk  (08/09/2023)   Received from [facility]   Hunger Vital Sign    Worried About Running Out of Food in the Last Year: Never true    Ran Out of Food in the Last Year: Never true  Transportation Needs: No Transportation Needs (08/09/2023)   Received from [facility]    In the past 12 months, has lack of reliable transportation kept you from medical appointments, meetings, work or from getting things needed for daily living? : No  Physical Activity: Inactive (02/13/2023)   Exercise Vital Sign    Days of Exercise per Week: 0 days    Minutes of Exercise per Session: 0 min  Stress: No Stress Concern Present (02/13/2023)   [employer] of Occupational Health - Occupational Stress Questionnaire    Feeling of Stress : Not at all  Social Connections: Unknown (02/13/2023)   Social Connection and Isolation Panel [NHANES]    Frequency of Communication with Friends and Family: More than three times a week    Frequency of Social Gatherings with Friends and Family: More than three times a week    Attends Religious Services: More than 4 times per year    Active Member of [employer] or Organizations: No    Attends Banker Meetings: Never    Marital Status: Not on file  Intimate Partner Violence: Not At Risk (07/17/2022)   Humiliation, Afraid, Rape, and Kick questionnaire    Fear of Current or Ex-Partner: No    Emotionally Abused: No    Physically Abused: No    Sexually Abused: No    Review of Systems  All other systems reviewed and are negative.       Objective    BP 109/78   Pulse (!) 109   Temp 98.2 F (36.8 C) (Oral)   Resp 16   Ht 5\' 9"  (1.753 m)   Wt 234 lb 9.6  oz (106.4 kg)   SpO2 96%   BMI 34.64 kg/m    Physical Exam Vitals and nursing note reviewed.  Constitutional:      General: He is not in acute distress. Cardiovascular:     Rate and Rhythm: Normal rate and regular rhythm.  Pulmonary:     Effort: Pulmonary effort is normal.     Breath sounds: Normal breath sounds.  Abdominal:     Palpations: Abdomen is soft.     Tenderness: There is no abdominal tenderness.  Neurological:     General: No focal deficit present.     Mental Status: He is alert and oriented to person, place, and time.         Assessment & Plan:   Essential hypertension  Mixed hyperlipidemia  Anxiety and depression   Appears stable. Continue present management  Return in about 4 months (around 12/25/2023) for follow up.   Tommie Raymond, MD

## 2023-08-27 ENCOUNTER — Encounter: Payer: Self-pay | Admitting: Family Medicine

## 2023-08-31 ENCOUNTER — Encounter: Payer: Self-pay | Admitting: Hematology and Oncology

## 2023-08-31 ENCOUNTER — Encounter: Payer: Self-pay | Admitting: Hematology

## 2023-09-02 ENCOUNTER — Other Ambulatory Visit: Payer: Self-pay | Admitting: Family Medicine

## 2023-09-08 ENCOUNTER — Telehealth: Payer: Self-pay

## 2023-09-08 NOTE — Telephone Encounter (Signed)
 Notified Patient of completion of [employer] Attending Physician Statement for Disability. Fax transmission confirmation received. Copy of forms mailed to patient as requested. No other needs or concerns noted at this time.

## 2023-09-12 ENCOUNTER — Other Ambulatory Visit: Payer: Self-pay | Admitting: Family Medicine

## 2023-10-08 ENCOUNTER — Telehealth: Payer: Self-pay | Admitting: *Deleted

## 2023-10-08 NOTE — Telephone Encounter (Signed)
 Received call from pt requesting to move his appt from 10/16/23 to earlier in the week due to family member funeral. Wyline Hearing, PA-C can see him on 01/14/24 @ 8:40 am with labs prior. Pt agreeable to this day. Scheduling made aware.

## 2023-10-12 ENCOUNTER — Other Ambulatory Visit: Payer: Self-pay | Admitting: Physician Assistant

## 2023-10-12 DIAGNOSIS — C9000 Multiple myeloma not having achieved remission: Secondary | ICD-10-CM

## 2023-10-14 ENCOUNTER — Inpatient Hospital Stay (HOSPITAL_BASED_OUTPATIENT_CLINIC_OR_DEPARTMENT_OTHER): Payer: Self-pay | Admitting: Nurse Practitioner

## 2023-10-14 ENCOUNTER — Inpatient Hospital Stay: Payer: Self-pay | Attending: Physician Assistant

## 2023-10-14 ENCOUNTER — Encounter: Payer: Self-pay | Admitting: Nurse Practitioner

## 2023-10-14 ENCOUNTER — Inpatient Hospital Stay (HOSPITAL_BASED_OUTPATIENT_CLINIC_OR_DEPARTMENT_OTHER): Payer: Self-pay | Admitting: Physician Assistant

## 2023-10-14 VITALS — BP 135/94 | HR 83 | Temp 98.1°F | Resp 15 | Wt 242.8 lb

## 2023-10-14 DIAGNOSIS — Z515 Encounter for palliative care: Secondary | ICD-10-CM

## 2023-10-14 DIAGNOSIS — Z87891 Personal history of nicotine dependence: Secondary | ICD-10-CM | POA: Insufficient documentation

## 2023-10-14 DIAGNOSIS — C9000 Multiple myeloma not having achieved remission: Secondary | ICD-10-CM

## 2023-10-14 DIAGNOSIS — Z79899 Other long term (current) drug therapy: Secondary | ICD-10-CM | POA: Diagnosis not present

## 2023-10-14 LAB — LACTATE DEHYDROGENASE: LDH: 124 U/L (ref 98–192)

## 2023-10-14 LAB — CMP (CANCER CENTER ONLY)
ALT: 24 U/L (ref 0–44)
AST: 24 U/L (ref 15–41)
Albumin: 4 g/dL (ref 3.5–5.0)
Alkaline Phosphatase: 57 U/L (ref 38–126)
Anion gap: 8 (ref 5–15)
BUN: 18 mg/dL (ref 6–20)
CO2: 24 mmol/L (ref 22–32)
Calcium: 9.3 mg/dL (ref 8.9–10.3)
Chloride: 104 mmol/L (ref 98–111)
Creatinine: 1.1 mg/dL (ref 0.61–1.24)
GFR, Estimated: >60 mL/min (ref >60)
Glucose, Bld: 104 mg/dL — ABNORMAL HIGH (ref 70–99)
Potassium: 4 mmol/L (ref 3.5–5.1)
Sodium: 136 mmol/L (ref 135–145)
Total Bilirubin: 0.7 mg/dL (ref 0.0–1.2)
Total Protein: 6.9 g/dL (ref 6.5–8.1)

## 2023-10-14 LAB — CBC WITH DIFFERENTIAL (CANCER CENTER ONLY)
Abs Immature Granulocytes: 0.02 10*3/uL (ref 0.00–0.07)
Basophils Absolute: 0 10*3/uL (ref 0.0–0.1)
Basophils Relative: 0 %
Eosinophils Absolute: 0.1 10*3/uL (ref 0.0–0.5)
Eosinophils Relative: 1 %
HCT: 39.9 % (ref 39.0–52.0)
Hemoglobin: 13.3 g/dL (ref 13.0–17.0)
Immature Granulocytes: 0 %
Lymphocytes Relative: 45 %
Lymphs Abs: 2.8 10*3/uL (ref 0.7–4.0)
MCH: 28.9 pg (ref 26.0–34.0)
MCHC: 33.3 g/dL (ref 30.0–36.0)
MCV: 86.6 fL (ref 80.0–100.0)
Monocytes Absolute: 0.6 10*3/uL (ref 0.1–1.0)
Monocytes Relative: 9 %
Neutro Abs: 2.8 10*3/uL (ref 1.7–7.7)
Neutrophils Relative %: 45 %
Platelet Count: 178 10*3/uL (ref 150–400)
RBC: 4.61 MIL/uL (ref 4.22–5.81)
RDW: 14.9 % (ref 11.5–15.5)
WBC Count: 6.2 10*3/uL (ref 4.0–10.5)
nRBC: 0 % (ref 0.0–0.2)

## 2023-10-14 NOTE — Progress Notes (Signed)
 [facility] [phone]   [phone]  PROGRESS NOTE  Patient Care Team: Abraham Abo, MD as PCP - General (Family Medicine) Avanell Leigh, MD as PCP - Cardiology (Cardiology) Riley Cheadle Windsor Hatcher, MD as Attending Physician (Gastroenterology)  Hematological/Oncological History # IgG Kappa Multiple Myeloma, 1p32/1q21.  01/04/2022: MRI lumbar spine shows diffuse heterogeneous marrow signal with heterogeneous enhancement throughout the thoracolumbar spine.  Additionally, there is subacute-chronic L3 vertebral body compression fraction. 01/14/2022: establish care with Dr. Rosaline Coma  02/10/2022: bone marrow biopsy shows range of plasma cell involvement from 50% on the biopsy to 60% on aspirate smear slides and close to 90% on the clot section for an overall percentage estimated at approximately 80% overall. 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  10/02/2022: Cycle 1 Day 1 of Dara/Rev/Dex 11/03/2022: Cycle 2 Day 1 of Dara/Rev/Dex 11/30/2022:  Cycle 3 Day 1 of Dara/Rev/Dex 12/29/2022: Cycle 4 Day 1 of Dara/Rev/Dex 01/26/2023: Cycle 5 Day 1 of Dara/Rev/Dex 02/23/2023: Cycle 6 Day 1 of Dara/Rev/Dex 03/23/2023: Cycle 7 Day 1 of Dara/Rev/Dex 04/20/2023: Cycle 8 Day 1 of Dara/Rev/Dex 05/19/2023: Cycle 9 Day 1 of Dara/Rev/Dex 1/11-1/17/2025: mobilization and stem cell collection  06/15/2023: Cycle 10 Day 1 of Dara/Rev/Dex 07/14/2023: Cycle 11 Day 1 of  Dara/Rev/Dex 08/04/2023: Underwent autologous stem cell transplant at [facility] on 08/04/2023.   Interval History:  [name] 53 y.o. male for continued management of IgG kappa multiple myeloma. The patient's last visit was on 07/14/2023 and in the interim, he underwent stem cell transplant on 08/04/2023.   On exam today [name] reports he is doing well after undergoing auto stem cell transplant. His energy and appetite have been overall stable. He is having some ongoing chest congestion and cough that is lingering for the last week or so. He denies any shortness of breath or chest pain. He continues to have low back pain that is chronic in nature. He denies fevers, chills, sweats, shortness of breath, chest pain, nausea, vomiting or bowel habit changes.  A full 10 point ROS was otherwise negative.  MEDICAL HISTORY:  Past Medical History:  Diagnosis Date   Allergy    Anemia    Atypical chest pain    Cancer (HCC)    Multiple myeloma with normocytic anemia   GERD (gastroesophageal reflux disease)    Hepatic cyst    Benign by MRI   Hiatal hernia    History of colonic polyps    Hyperlipidemia    Hypertension    Rectal bleeding    Tobacco abuse     SURGICAL HISTORY: Past Surgical History:  Procedure Laterality Date   COLONOSCOPY  01/10/2009     RMR: Normal rectum, normal colon; repeat in 2015 due to FH of colon cancer   COLONOSCOPY N/A 01/09/2014   AVW:[id number] colonic polyps-removed as described   COLONOSCOPY N/A 09/18/2016   Procedure: COLONOSCOPY;  Surgeon: Suzette Espy, MD;  Location: [facility];  Service:  Endoscopy;  Laterality: N/A;  730    ESOPHAGOGASTRODUODENOSCOPY  10/04/2007   RMR: Distal esophageal erosions consistent with erosive reflux esophagitis, patulous gastroesophageal junction status post passage of a  Maloney dilator, 56 [place].  Otherwise, unremarkable esophagus.  Hiatal hernia.  Otherwise normal stomach.  Bulbar erosion   ESOPHAGOGASTRODUODENOSCOPY N/A  01/09/2014   Erosive reflux esophagitis. Small hiatal hernia   HUMERUS IM NAIL Left 02/22/2022   Procedure: INTRAMEDULLARY (IM) NAIL HUMERAL;  Surgeon: Janeth Medicus, MD;  Location: [facility] OR;  Service: Orthopedics;  Laterality: Left;   I & D EXTREMITY Left 07/26/2018   Procedure: IRRIGATION AND DEBRIDEMENT EXTREMITY;  Surgeon: Mauricia South, MD;  Location: [facility] OR;  Service: Plastics;  Laterality: Left;   IR BONE TUMOR(S)RF ABLATION  02/05/2022   IR BONE TUMOR(S)RF ABLATION  02/24/2022   IR KYPHO EA ADDL LEVEL THORACIC OR LUMBAR  02/19/2022   IR KYPHO LUMBAR INC FX REDUCE BONE BX UNI/BIL CANNULATION INC/IMAGING  02/05/2022   IR KYPHO LUMBAR INC FX REDUCE BONE BX UNI/BIL CANNULATION INC/IMAGING  02/19/2022   PERCUTANEOUS PINNING Left 07/26/2018   Procedure: PERCUTANEOUS PINNING EXTREMITY;  Surgeon: Mauricia South, MD;  Location: [facility] OR;  Service: Plastics;  Laterality: Left;    SOCIAL HISTORY: Social History   Socioeconomic History   Marital status: Divorced    Spouse name: Not on file   Number of children: 2   Years of education: Not on file   Highest education level: Not on file  Occupational History   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[employer]    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[employer]  Tobacco Use   Smoking status: Former    Current packs/day: 0.00    Average packs/day: 0.3 packs/day for 10.0 years (2.5 ttl pk-yrs)    Types: Cigarettes    Start date: 12/25/2011    Quit date: 12/24/2021    Years since quitting: 1.8   Smokeless tobacco: Never  Vaping Use   Vaping status: Never Used  Substance and Sexual Activity   Alcohol use: Yes    Alcohol/week: 0.0 standard drinks of alcohol    Comment: occasional/wine on the weekends   Drug use: No   Sexual activity: Not on file  Other Topics Concern   Not on file  Social History Narrative   Right handed   Lives alone one story home   Social Drivers of Health   Financial Resource Strain: Low Risk  (02/13/2023)   Overall  Financial Resource Strain (CARDIA)    Difficulty of Paying Living Expenses: Not very hard  Food Insecurity: Low Risk  (08/09/2023)   Received from [facility]   Hunger Vital Sign    Worried About Running Out of Food in the Last Year: Never true    Ran Out of Food in the Last Year: Never true  Transportation Needs: No Transportation Needs (08/09/2023)   Received from [facility]    In the past 12 months, has lack of reliable transportation kept you from medical appointments, meetings, work or from getting things needed for daily living? : No  Physical Activity: Inactive (02/13/2023)   Exercise Vital Sign    Days of Exercise per Week: 0 days    Minutes of Exercise per Session: 0 min  Stress: No Stress Concern Present (02/13/2023)   [employer] of Occupational Health - Occupational Stress Questionnaire    Feeling of Stress : Not at all  Social Connections: Unknown (02/13/2023)   Social Connection and Isolation  Panel [NHANES]    Frequency of Communication with Friends and Family: More than three times a week    Frequency of Social Gatherings with Friends and Family: More than three times a week    Attends Religious Services: More than 4 times per year    Active Member of [employer] or Organizations: No    Attends Banker Meetings: Never    Marital Status: Not on file  Intimate Partner Violence: Not At Risk (07/17/2022)   Humiliation, Afraid, Rape, and Kick questionnaire    Fear of Current or Ex-Partner: No    Emotionally Abused: No    Physically Abused: No    Sexually Abused: No    FAMILY HISTORY: Family History  Problem Relation Age of Onset   Heart disease Mother    Hypertension Mother    Diabetes Father    Hypertension Father    Colon cancer Sister        passed away from colon cancer, in her 33s   Heart attack Maternal Uncle    Prostate cancer Maternal Uncle    Diabetes Other    Pancreatic cancer Neg Hx    Rectal cancer Neg Hx    Esophageal  cancer Neg Hx    Stomach cancer Neg Hx     ALLERGIES:  has no known allergies.  MEDICATIONS:  Current Outpatient Medications  Medication Sig Dispense Refill   acyclovir  (ZOVIRAX ) 400 MG tablet Take 1 tablet by mouth twice daily 60 tablet 0   allopurinol  (ZYLOPRIM ) 300 MG tablet Take 1 tablet (300 mg total) by mouth daily. (Patient not taking: Reported on 08/25/2023) 30 tablet 3   cetirizine  (ZYRTEC ) 10 MG tablet Take 10 mg by mouth daily as needed for allergies. (Patient not taking: Reported on 08/25/2023)     docusate sodium  (COLACE) 100 MG capsule Take 1 capsule (100 mg total) by mouth 2 (two) times daily as needed for mild constipation. (Patient not taking: Reported on 08/25/2023) 30 capsule 3   DULoxetine  (CYMBALTA ) 20 MG capsule Take 1 capsule by mouth once daily (Patient not taking: Reported on 08/25/2023) 90 capsule 0   gabapentin  (NEURONTIN ) 100 MG capsule Take 1 capsule by mouth twice daily 60 capsule 0   hydrochlorothiazide  (HYDRODIURIL ) 25 MG tablet Take 1 tablet by mouth once daily 90 tablet 0   lisinopril  (ZESTRIL ) 40 MG tablet Take 1 tablet by mouth once daily 90 tablet 0   metoprolol  tartrate (LOPRESSOR ) 50 MG tablet 50 mg by oral route. (Patient not taking: Reported on 08/25/2023)     Multiple Vitamin (MULTIVITAMIN) tablet Take 1 tablet by mouth daily.     Na Sulfate-K Sulfate-Mg Sulf 17.5-3.13-1.6 GM/177ML SOLN SMARTSIG:1 Kit(s) By Mouth Once (Patient not taking: Reported on 08/25/2023)     ondansetron  (ZOFRAN ) 8 MG tablet Take 1 tablet (8 mg total) by mouth every 8 (eight) hours as needed for nausea or vomiting. 60 tablet 3   pantoprazole  (PROTONIX ) 40 MG tablet Take 1 tablet by mouth twice daily 60 tablet 6   polyethylene glycol powder (GLYCOLAX /MIRALAX ) 17 GM/SCOOP powder Take 1 capful (17 g) with water  by mouth daily. (Patient not taking: Reported on 08/25/2023) 238 g 0   REVLIMID  25 MG capsule Take one capsule daily for 14 days and then none for 7 days [employer] auth# [id number] obtained  07/03/23 (Patient not taking: Reported on 08/25/2023) 14 capsule 0   rosuvastatin  (CRESTOR ) 10 MG tablet Take 1 tablet by mouth once daily (Patient not taking: Reported on 08/25/2023) 90 tablet  0   sildenafil  (VIAGRA ) 50 MG tablet TAKE 1 TABLET BY MOUTH ONCE DAILY AS NEEDED FOR ERECTILE DYSFUNCTION 10 tablet 0   No current facility-administered medications for this visit.    REVIEW OF SYSTEMS:   Constitutional: ( - ) fevers, ( - )  chills , ( - ) night sweats Eyes: ( - ) blurriness of vision, ( - ) double vision, ( - ) watery eyes Ears, nose, mouth, throat, and face: ( - ) mucositis, ( - ) sore throat Respiratory: ( - ) cough, ( - ) dyspnea, ( - ) wheezes Cardiovascular: ( - ) palpitation, ( - ) chest discomfort, ( - ) lower extremity swelling Gastrointestinal:  ( - ) nausea, ( -) heartburn, ( - ) change in bowel habits Skin: ( - ) abnormal skin rashes Lymphatics: ( - ) new lymphadenopathy, ( - ) easy bruising Neurological: ( - ) numbness, ( - ) tingling, ( - ) new weaknesses Behavioral/Psych: ( - ) mood change, ( - ) new changes  All other systems were reviewed with the patient and are negative.  PHYSICAL EXAMINATION: ECOG PERFORMANCE STATUS: 1 - Symptomatic but completely ambulatory  Vitals:   10/14/23 0843 10/14/23 0844  BP: (!) 142/92 (!) 135/94  Pulse: 83   Resp: 15   Temp: 98.1 F (36.7 C)   SpO2: 97%    Filed Weights   10/14/23 0843  Weight: 242 lb 12.8 oz (110.1 kg)    GENERAL: Well-appearing young African-American male, alert, no distress and comfortable SKIN: skin color, texture, turgor are normal, no rashes or significant lesions EYES: conjunctiva are pink and non-injected, sclera clear. LUNGS: clear to auscultation and percussion with normal breathing effort HEART: regular rate & rhythm and no murmurs and no lower extremity edema Musculoskeletal: no cyanosis of digits and no clubbing  PSYCH: alert & oriented x 3, fluent speech NEURO: no focal motor/sensory  deficits  LABORATORY DATA:  I have reviewed the data as listed    Latest Ref Rng & Units 10/14/2023    8:13 AM 07/14/2023   10:51 AM 06/15/2023    8:21 AM  CBC  WBC 4.0 - 10.5 K/uL 6.2  5.8  8.0   Hemoglobin 13.0 - 17.0 g/dL 91.4  78.2  95.6   Hematocrit 39.0 - 52.0 % 39.9  40.4  38.3   Platelets 150 - 400 K/uL 178  257  156        Latest Ref Rng & Units 10/14/2023    8:13 AM 07/14/2023   10:51 AM 06/15/2023    8:21 AM  CMP  Glucose 70 - 99 mg/dL 213  89  086   BUN 6 - 20 mg/dL 18  11  11    Creatinine 0.61 - 1.24 mg/dL 5.78  4.69  6.29   Sodium 135 - 145 mmol/L 136  137  135   Potassium 3.5 - 5.1 mmol/L 4.0  3.9  3.6   Chloride 98 - 111 mmol/L 104  102  99   CO2 22 - 32 mmol/L 24  29  26    Calcium  8.9 - 10.3 mg/dL 9.3  9.8  9.1   Total Protein 6.5 - 8.1 g/dL 6.9  7.1  7.0   Total Bilirubin 0.0 - 1.2 mg/dL 0.7  0.4  0.5   Alkaline Phos 38 - 126 U/L 57  72  94   AST 15 - 41 U/L 24  12  18    ALT 0 - 44 U/L 24  13  22     Lab Results  Component Value Date   MPROTEIN Not Observed 07/14/2023   MPROTEIN Not Observed 06/15/2023   MPROTEIN 0.1 (H) 05/19/2023   Lab Results  Component Value Date   KPAFRELGTCHN 9.6 07/14/2023   KPAFRELGTCHN 10.7 06/15/2023   KPAFRELGTCHN 9.9 05/19/2023   LAMBDASER 6.1 07/14/2023   LAMBDASER 6.7 06/15/2023   LAMBDASER 6.2 05/19/2023   KAPLAMBRATIO 1.57 07/14/2023   KAPLAMBRATIO 1.60 06/15/2023   KAPLAMBRATIO 1.60 05/19/2023   RADIOGRAPHIC STUDIES: No results found.  ASSESSMENT & PLAN ANDRIEL OMALLEY 53 y.o. male with medical history significant for IgG kappa multiple myeloma who presents for a follow up visit.   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 -- Recommend VRD chemotherapy with intention of proceeding to transplant. -- Labs at each visit to include CBC, CMP, LDH with monthly restaging  labs SPEP and serum free light chains -- Started VRD therapy on 02/26/2022. Held Velcade  therapy on 09/24/2022 due to development of ocular toxicity. --Switched to Dara/Rev/Dex on 10/02/2022. Last dose on 07/13/2023. --Underwent bone marrow transplant at [facility] on 08/04/2023.  PLAN: --Labs from today were reviewed and require no intervention. WBC 6.2, Hgb 13.3, Plt 178, creatinine and calcium  normal. Myeloma panel pending --Due for Day +100 on 11/16/2023 with repeat BMBx and f/u with Dr. Maile Score on 11/24/2023 --Will plan to follow up week of July 7th week to discuss role for Revlimid  therapy.   #Erectile Dysfunction -- Likely secondary to chemotherapy. --prescribed Viagra  50 mg p.o. as needed x 10 pills.  --Patient notes he is not getting the results he would like, encouraged him to double the dose to Viagra  100 mg p.o. to see if that is more effective. -- If ineffective could consider Cialis.  # H/O Leg Discomfort/Restless Leg -- Patient provided with Requip  by the emergency department, discontinued due to poor efficacy. --Evaluated by Dr. Mark Sil on 07/28/2022. Recommended to short course of steroids so started prednisone  50 mg daily x 7 days. --Discussed etiologies including iron deficiency so iron levels were checked and was normal.   #Pathologic fractures-secondary to MM: --Involving L1, L2 and L3 compression fracture and left humerus fracture.  --Underwent kyphoplasty of L3 fracture on 02/19/2022 --Underwent medullary nailing of left humeral shaft on 02/22/2022 --Underwent kyphoplasty on 06/04/2022.   #Pain Medication -- Per palliative care patient has weaned off MS contin . Currently conitnues on gabapentin  100 mg BID as needed --Under the care of palliative care team.   #Supportive Care -- chemotherapy education complete -- port placement not required -- zofran  8mg  q8H PRN and compazine  10mg  PO q6H for nausea -- acyclovir  400mg  PO BID for VCZ prophylaxis -- allopurinol  300mg  PO daily for  TLS prophylaxis -- Dental clearance for Zometa/Denosumab  approved.  HOLDING per [facility] transplant request.   No orders of the defined types were placed in this encounter.   All questions were answered. The patient knows to call the clinic with any problems, questions or concerns.  I have spent a total of 30 minutes minutes of face-to-face and non-face-to-face time, preparing to see the patient,performing a medically appropriate examination, counseling and educating the patient, documenting clinical information in the electronic health record,  and care coordination.  Wyline Hearing PA-C Dept of Hematology and Oncology [facility] Phone: [phone]

## 2023-10-14 NOTE — Progress Notes (Signed)
 Palliative Medicine [facility]  Telephone:([phone]) [phone] Fax:([phone]) [phone]   Name: [name] Date: 10/14/2023 MRN: [id number]  DOB: 1971/02/11  Patient Care Team: Abraham Abo, MD as PCP - General (Family Medicine) Avanell Leigh, MD as PCP - Cardiology (Cardiology) Riley Cheadle Windsor Hatcher, MD as Attending Physician (Gastroenterology)   INTERVAL HISTORY: [name] is a 53 y.o. male with oncologic medical history including IgG Kappa Multiple Myeloma, L1, L2, and L3 vertebral compression fraction s/p kyphoplasty(9/27) and IM nailing of left humeral shaft (9/30), currently undergoing VRD therapy.  Palliative ask to see for symptom management.   SOCIAL HISTORY:     reports that he quit smoking about 21 months ago. His smoking use included cigarettes. He started smoking about 11 years ago. He has a 2.5 pack-year smoking history. He has never used smokeless tobacco. He reports current alcohol use. He reports that he does not use drugs.  ADVANCE DIRECTIVES:    CODE STATUS:   PAST MEDICAL HISTORY: Past Medical History:  Diagnosis Date   Allergy    Anemia    Atypical chest pain    Cancer (HCC)    Multiple myeloma with normocytic anemia   GERD (gastroesophageal reflux disease)    Hepatic cyst    Benign by MRI   Hiatal hernia    History of colonic polyps    Hyperlipidemia    Hypertension    Rectal bleeding    Tobacco abuse     ALLERGIES:  has no known allergies.  MEDICATIONS:  Current Outpatient Medications  Medication Sig Dispense Refill   acyclovir  (ZOVIRAX ) 400 MG tablet Take 1 tablet by mouth twice daily 60 tablet 0   albuterol  (VENTOLIN  HFA) 108 (90 Base) MCG/ACT inhaler Inhale 2 puffs into the lungs every 6 (six) hours as needed for wheezing or shortness of breath. (Patient not taking: Reported on 08/25/2023) 8 g 0   allopurinol  (ZYLOPRIM ) 300 MG tablet Take 1 tablet (300 mg total) by mouth daily. (Patient not taking: Reported on 08/25/2023) 30  tablet 3   aspirin  EC 81 MG tablet Take 81 mg by mouth daily. Swallow whole. (Patient not taking: Reported on 08/25/2023)     cetirizine  (ZYRTEC ) 10 MG tablet Take 10 mg by mouth daily as needed for allergies. (Patient not taking: Reported on 08/25/2023)     chlorhexidine  (PERIDEX ) 0.12 % solution Swish 5 ml for 1 minute and spit out two times a day (Patient not taking: Reported on 08/25/2023)     clonazePAM  (KLONOPIN ) 0.5 MG tablet Take 1 tablet (0.5 mg total) by mouth 2 (two) times daily as needed for anxiety. (Patient not taking: Reported on 08/25/2023) 20 tablet 0   docusate sodium  (COLACE) 100 MG capsule Take 1 capsule (100 mg total) by mouth 2 (two) times daily as needed for mild constipation. (Patient not taking: Reported on 08/25/2023) 30 capsule 3   DULoxetine  (CYMBALTA ) 20 MG capsule Take 1 capsule by mouth once daily (Patient not taking: Reported on 08/25/2023) 90 capsule 0   gabapentin  (NEURONTIN ) 100 MG capsule Take 1 capsule by mouth twice daily 60 capsule 0   hydrochlorothiazide  (HYDRODIURIL ) 25 MG tablet Take 1 tablet by mouth once daily 90 tablet 0   lisinopril  (ZESTRIL ) 40 MG tablet Take 1 tablet by mouth once daily 90 tablet 0   meloxicam (MOBIC) 15 MG tablet Take 1 tablet by mouth daily. (Patient not taking: Reported on 08/25/2023)     Methocarbamol  1000 MG TABS Take 1,000 mg  by mouth every 6 (six) hours as needed. (Patient not taking: Reported on 08/25/2023) 45 tablet 1   metoprolol  tartrate (LOPRESSOR ) 50 MG tablet 50 mg by oral route. (Patient not taking: Reported on 08/25/2023)     morphine  (MS CONTIN ) 30 MG 12 hr tablet Take 1 tablet (30 mg total) by mouth every 12 (twelve) hours. (Patient not taking: Reported on 08/25/2023) 60 tablet 0   Multiple Vitamin (MULTIVITAMIN) tablet Take 1 tablet by mouth daily.     Na Sulfate-K Sulfate-Mg Sulf 17.5-3.13-1.6 GM/177ML SOLN SMARTSIG:1 Kit(s) By Mouth Once (Patient not taking: Reported on 08/25/2023)     naloxone  (NARCAN ) nasal spray 4 mg/0.1 mL Call 911.  Administer a single spray in one nostril. If no or minimal response after 2 to 3 minutes, an additional dose may be given in the alternate nostril. (Patient not taking: Reported on 08/25/2023)     nirmatrelvir  & ritonavir  (PAXLOVID , 300/100,) 20 x 150 MG & 10 x 100MG  TBPK TAKE 3 TABLETS TOGETHER (TWO 150 MG NIRMATRELVIR  TABLETS AND ONE 100 MG RITONAVIR  TABLET) BY MOUTH TWICE DAILY FOR 5 DAYS. (Patient not taking: Reported on 08/25/2023)     ondansetron  (ZOFRAN ) 8 MG tablet Take 1 tablet (8 mg total) by mouth every 8 (eight) hours as needed for nausea or vomiting. 60 tablet 3   pantoprazole  (PROTONIX ) 40 MG tablet Take 1 tablet by mouth twice daily 60 tablet 6   polyethylene glycol powder (GLYCOLAX /MIRALAX ) 17 GM/SCOOP powder Take 1 capful (17 g) with water  by mouth daily. (Patient not taking: Reported on 08/25/2023) 238 g 0   predniSONE  (DELTASONE ) 10 MG tablet See Admin Instructions. (Patient not taking: Reported on 08/25/2023)     prochlorperazine  (COMPAZINE ) 10 MG tablet Take 1 tablet (10 mg total) by mouth every 6 (six) hours as needed for nausea or vomiting. (Patient not taking: Reported on 08/25/2023) 60 tablet 0   REVLIMID  25 MG capsule Take one capsule daily for 14 days and then none for 7 days [employer] auth# [id number] obtained 07/03/23 (Patient not taking: Reported on 08/25/2023) 14 capsule 0   rosuvastatin  (CRESTOR ) 10 MG tablet Take 1 tablet by mouth once daily (Patient not taking: Reported on 08/25/2023) 90 tablet 0   sildenafil  (VIAGRA ) 50 MG tablet TAKE 1 TABLET BY MOUTH ONCE DAILY AS NEEDED FOR ERECTILE DYSFUNCTION 10 tablet 0   Spacer/Aero-Holding Chambers (EQ SPACE CHAMBER ANTI-STATIC L) DEVI USE AS DIRECTED EVERY 4 HOURS AS NEEDED WITH  MDI (Patient not taking: Reported on 08/25/2023) 1 each 0   No current facility-administered medications for this visit.    VITAL SIGNS: There were no vitals taken for this visit. There were no vitals filed for this visit.  Estimated body mass index is 35.86 kg/m as  calculated from the following:   Height as of 08/25/23: 5\' 9"  (1.753 m).   Weight as of an earlier encounter on 10/14/23: 242 lb 12.8 oz (110.1 kg).   PERFORMANCE STATUS (ECOG) : 1 - Symptomatic but completely ambulatory  Assessment NAD, ambulatory RRR Normal breathing pattern AAO x4  IMPRESSION:  Mr. Treiber presented to clinic for follow-up. He is doing exceptionally well. Denies nausea, vomiting, constipation, or diarrhea. Appetite is good. No concerns mentioned. Remains active as possible. Is being followed at [facility]. He is appreciative of his overall progress. No longer taking medications for pain or anxiety. We will minimize his medication list to current regimen.   Brandt reports pain is controlled. Some days are better than others. When he does have  pain it is in his lower back. He is remaining active as possible while also listening to his body and taking rest breaks as needed. Taking Gabapentin  twice daily as needed for his neuropathic pain.   No symptom management needs at this time. No adjustments to medication regimen.   Overall Gram is much improved.  Appreciative of where he is in his health journey.  He knows to contact our office as needed with any concerns.  PLAN:  Gabapentin  100 mg twice daily as needed  Zofran  and Compazine  as needed for nausea Miralax  daily as needed  for constipation I will plan to see patient back in 6-8 weeks in collaboration with other oncology appointments.  Patient knows to contact office sooner if needed.   Patient expressed understanding and was in agreement with this plan. He also understands that He can call the clinic at any time with any questions, concerns, or complaints.    Any controlled substances utilized were prescribed in the context of palliative care. PDMP has been reviewed.    Visit consisted of counseling and education dealing with the complex and emotionally intense issues of symptom management and palliative care in the  setting of serious and potentially life-threatening illness.  Dellia Ferguson, AGPCNP-BC  Palliative Medicine Team/[facility]

## 2023-10-15 LAB — KAPPA/LAMBDA LIGHT CHAINS
Kappa free light chain: 5.5 mg/L (ref 3.3–19.4)
Kappa, lambda light chain ratio: 2.62 — ABNORMAL HIGH (ref 0.26–1.65)
Lambda free light chains: 2.1 mg/L — ABNORMAL LOW (ref 5.7–26.3)

## 2023-10-16 ENCOUNTER — Inpatient Hospital Stay: Payer: Self-pay | Admitting: Physician Assistant

## 2023-10-16 ENCOUNTER — Inpatient Hospital Stay: Payer: Self-pay

## 2023-10-16 LAB — MULTIPLE MYELOMA PANEL, SERUM
Albumin SerPl Elph-Mcnc: 3.5 g/dL (ref 2.9–4.4)
Albumin/Glob SerPl: 1.5 (ref 0.7–1.7)
Alpha 1: 0.2 g/dL (ref 0.0–0.4)
Alpha2 Glob SerPl Elph-Mcnc: 0.8 g/dL (ref 0.4–1.0)
B-Globulin SerPl Elph-Mcnc: 1 g/dL (ref 0.7–1.3)
Gamma Glob SerPl Elph-Mcnc: 0.5 g/dL (ref 0.4–1.8)
Globulin, Total: 2.5 g/dL (ref 2.2–3.9)
IgA: 24 mg/dL — ABNORMAL LOW (ref 90–386)
IgG (Immunoglobin G), Serum: 473 mg/dL — ABNORMAL LOW (ref 603–1613)
IgM (Immunoglobulin M), Srm: 8 mg/dL — ABNORMAL LOW (ref 20–172)
Total Protein ELP: 6 g/dL (ref 6.0–8.5)

## 2023-11-26 ENCOUNTER — Other Ambulatory Visit: Payer: Self-pay | Admitting: Nurse Practitioner

## 2023-11-26 ENCOUNTER — Other Ambulatory Visit: Payer: Self-pay | Admitting: Hematology and Oncology

## 2023-11-26 DIAGNOSIS — G893 Neoplasm related pain (acute) (chronic): Secondary | ICD-10-CM

## 2023-11-26 DIAGNOSIS — C9000 Multiple myeloma not having achieved remission: Secondary | ICD-10-CM

## 2023-11-26 DIAGNOSIS — Z515 Encounter for palliative care: Secondary | ICD-10-CM

## 2023-11-27 ENCOUNTER — Encounter: Payer: Self-pay | Admitting: Hematology

## 2023-11-27 ENCOUNTER — Encounter: Payer: Self-pay | Admitting: Hematology and Oncology

## 2023-11-30 ENCOUNTER — Inpatient Hospital Stay: Payer: Self-pay | Attending: Physician Assistant

## 2023-11-30 ENCOUNTER — Inpatient Hospital Stay: Payer: Self-pay | Admitting: Nurse Practitioner

## 2023-11-30 ENCOUNTER — Other Ambulatory Visit: Payer: Self-pay | Admitting: Hematology and Oncology

## 2023-11-30 ENCOUNTER — Telehealth: Payer: Self-pay

## 2023-11-30 ENCOUNTER — Encounter: Payer: Self-pay | Admitting: Hematology and Oncology

## 2023-11-30 ENCOUNTER — Inpatient Hospital Stay: Payer: Self-pay | Admitting: Hematology and Oncology

## 2023-11-30 DIAGNOSIS — C9000 Multiple myeloma not having achieved remission: Secondary | ICD-10-CM

## 2023-11-30 NOTE — Telephone Encounter (Signed)
 Pt did not show up at [facility] for scheduled appts. RN called pt, per pt he is trying to determine insurance coverage since his insurance recently changed so he would not be in the office today. Appt canceled, oncology notified. No further needs at this time.

## 2023-11-30 NOTE — Progress Notes (Signed)
 Rescheduled due to Insurance Issues

## 2023-11-30 NOTE — Progress Notes (Signed)
 Received staff msg from nurse Dwayne stating that pt is currently without insurance and he did not come today because of that so I called the pt insuring him that he can still keep his appt even without ins and he will receive an automatic self pay discount of 64%.  He was appreciative of the information and sd he will call back to reschedule today's appt.

## 2023-12-06 ENCOUNTER — Encounter: Payer: Self-pay | Admitting: Hematology and Oncology

## 2023-12-06 ENCOUNTER — Encounter: Payer: Self-pay | Admitting: Hematology

## 2023-12-06 NOTE — Progress Notes (Signed)
 No show--insurance issues.

## 2023-12-11 ENCOUNTER — Inpatient Hospital Stay: Payer: Self-pay

## 2023-12-11 ENCOUNTER — Inpatient Hospital Stay: Payer: Self-pay | Admitting: Hematology and Oncology

## 2023-12-28 ENCOUNTER — Ambulatory Visit: Admitting: Family Medicine

## 2023-12-29 ENCOUNTER — Encounter: Payer: Self-pay | Admitting: Hematology and Oncology

## 2023-12-29 ENCOUNTER — Encounter: Payer: Self-pay | Admitting: Hematology

## 2023-12-30 ENCOUNTER — Telehealth: Payer: Self-pay | Admitting: *Deleted

## 2023-12-30 DIAGNOSIS — C9000 Multiple myeloma not having achieved remission: Secondary | ICD-10-CM

## 2023-12-30 NOTE — Telephone Encounter (Signed)
 Received a call from Dr. Lafonda nurse, Landry, at [facility].  They were concerned that they had not seen Mr. Coven, and did not know if he was still following up with us  as his insurance lapsed.    There are notes in the

## 2023-12-30 NOTE — Telephone Encounter (Signed)
 TCT patient regarding his insurance status. Spoke with him. Asked pt if he was being seen at [facility] they were able to work out  anything for him financially.  He states his Medicaid is still pending. But he has been seen at [facility]. Arman. Advised that I would have Financial advocate call him as well as SW to see if there is anything that can be offered financially from this cancer center.    TCT [facility] and spoke to Nat Daisy, , one of the navigators. She reviewed his chart. She states he was hre in early July. He had his bone marrow biopsy done in early July, his MRD is 0. PET scan negative. He has not started on his Revlimid /Dara for maintenance. Nat will fax over the latest office notes, etc from Dr. Arman. She is aware of his financial/insurance difficulties.  Dr. Federico made aware of the above.

## 2023-12-31 ENCOUNTER — Telehealth: Payer: Self-pay

## 2023-12-31 NOTE — Telephone Encounter (Signed)
 [facility] Clinical Social Work  Clinical Social Work was referred by Engineer, civil (consulting) for [employer].  Clinical Social Worker attempted to contact patient by phone to offer support and assess for needs.  CSW does not have access to medicaid application statuses. Patient's address is listed in [place]. CSW left detailed vm stating to call [facility] DSS if application was completed there. If application was completed in [place] return CSW call.     Follow Up Plan:  No follow up scheduled. CSW will await a return call.  Lizbeth Sprague, LCSW  Clinical Social Worker [facility]

## 2024-01-15 ENCOUNTER — Other Ambulatory Visit: Payer: Self-pay | Admitting: Hematology and Oncology

## 2024-01-15 ENCOUNTER — Other Ambulatory Visit: Payer: Self-pay | Admitting: Family Medicine

## 2024-01-15 NOTE — Telephone Encounter (Signed)
 Hello - please see message below from [name].  Please advise regarding the status of this patient's Medicaid application, and what steps need to be done to get him connected with the care and treatment that he needs. He needs to have regular follow up care post transplant, and begin his maintenance therapy.  Thank you!

## 2024-01-18 ENCOUNTER — Telehealth: Payer: Self-pay | Admitting: Hematology and Oncology

## 2024-01-18 ENCOUNTER — Telehealth: Payer: Self-pay

## 2024-01-18 ENCOUNTER — Encounter: Payer: Self-pay | Admitting: Hematology and Oncology

## 2024-01-18 ENCOUNTER — Other Ambulatory Visit (HOSPITAL_COMMUNITY): Payer: Self-pay

## 2024-01-18 ENCOUNTER — Encounter: Payer: Self-pay | Admitting: Hematology

## 2024-01-18 NOTE — Telephone Encounter (Signed)
 Oral Oncology Patient Advocate Encounter   Received notification that prior authorization for Revlimid  10mg  is required.   PA submitted on 01/18/24 Key [id number] Status is pending     Lucie Lamer, CPhT [place]  [facility] Pharmacy Technician Patient Advocate Specialist [facility] [place] Phone: [phone]  Fax: [phone] [email or web address]

## 2024-01-18 NOTE — Telephone Encounter (Signed)
 Opened in error

## 2024-01-19 ENCOUNTER — Other Ambulatory Visit (HOSPITAL_COMMUNITY): Payer: Self-pay

## 2024-01-21 ENCOUNTER — Other Ambulatory Visit (HOSPITAL_COMMUNITY): Payer: Self-pay

## 2024-01-21 NOTE — Telephone Encounter (Addendum)
 Oral Oncology Patient Advocate Encounter  Prior Authorization for Revlimid  has been approved.    PA# [id number] Effective dates: 01/20/24 through 01/19/25  Patient must fill at [facility] pharmacy.   Lucie Lamer, CPhT   [facility] Pharmacy Technician Patient Advocate Specialist II [facility] [place] Phone: [phone]  Fax: [phone] [email or web address]

## 2024-01-25 ENCOUNTER — Encounter: Payer: Self-pay | Admitting: Hematology and Oncology

## 2024-01-25 ENCOUNTER — Encounter: Payer: Self-pay | Admitting: Hematology

## 2024-01-27 ENCOUNTER — Inpatient Hospital Stay: Payer: Self-pay | Attending: Physician Assistant

## 2024-01-27 ENCOUNTER — Other Ambulatory Visit: Payer: Self-pay | Admitting: *Deleted

## 2024-01-27 ENCOUNTER — Inpatient Hospital Stay (HOSPITAL_BASED_OUTPATIENT_CLINIC_OR_DEPARTMENT_OTHER): Payer: Self-pay | Admitting: Physician Assistant

## 2024-01-27 VITALS — BP 140/94 | HR 88 | Temp 97.5°F | Resp 18 | Ht 69.0 in | Wt 244.9 lb

## 2024-01-27 DIAGNOSIS — E785 Hyperlipidemia, unspecified: Secondary | ICD-10-CM | POA: Diagnosis not present

## 2024-01-27 DIAGNOSIS — C9001 Multiple myeloma in remission: Secondary | ICD-10-CM | POA: Diagnosis not present

## 2024-01-27 DIAGNOSIS — Z8042 Family history of malignant neoplasm of prostate: Secondary | ICD-10-CM | POA: Insufficient documentation

## 2024-01-27 DIAGNOSIS — I10 Essential (primary) hypertension: Secondary | ICD-10-CM | POA: Diagnosis not present

## 2024-01-27 DIAGNOSIS — M791 Myalgia, unspecified site: Secondary | ICD-10-CM | POA: Insufficient documentation

## 2024-01-27 DIAGNOSIS — Z800 Family history of malignant neoplasm of digestive organs: Secondary | ICD-10-CM | POA: Insufficient documentation

## 2024-01-27 DIAGNOSIS — F17210 Nicotine dependence, cigarettes, uncomplicated: Secondary | ICD-10-CM | POA: Diagnosis not present

## 2024-01-27 DIAGNOSIS — Z9481 Bone marrow transplant status: Secondary | ICD-10-CM | POA: Diagnosis not present

## 2024-01-27 DIAGNOSIS — N529 Male erectile dysfunction, unspecified: Secondary | ICD-10-CM | POA: Insufficient documentation

## 2024-01-27 DIAGNOSIS — Z7961 Long term (current) use of immunomodulator: Secondary | ICD-10-CM | POA: Insufficient documentation

## 2024-01-27 DIAGNOSIS — C9000 Multiple myeloma not having achieved remission: Secondary | ICD-10-CM | POA: Insufficient documentation

## 2024-01-27 DIAGNOSIS — M8440XA Pathological fracture, unspecified site, initial encounter for fracture: Secondary | ICD-10-CM | POA: Insufficient documentation

## 2024-01-27 DIAGNOSIS — K219 Gastro-esophageal reflux disease without esophagitis: Secondary | ICD-10-CM | POA: Insufficient documentation

## 2024-01-27 DIAGNOSIS — D649 Anemia, unspecified: Secondary | ICD-10-CM | POA: Diagnosis not present

## 2024-01-27 DIAGNOSIS — Z79624 Long term (current) use of inhibitors of nucleotide synthesis: Secondary | ICD-10-CM | POA: Diagnosis not present

## 2024-01-27 LAB — CBC WITH DIFFERENTIAL (CANCER CENTER ONLY)
Abs Immature Granulocytes: 0.03 K/uL (ref 0.00–0.07)
Basophils Absolute: 0 K/uL (ref 0.0–0.1)
Basophils Relative: 1 %
Eosinophils Absolute: 0.1 K/uL (ref 0.0–0.5)
Eosinophils Relative: 1 %
HCT: 43.3 % (ref 39.0–52.0)
Hemoglobin: 14.4 g/dL (ref 13.0–17.0)
Immature Granulocytes: 0 %
Lymphocytes Relative: 49 %
Lymphs Abs: 3.4 K/uL (ref 0.7–4.0)
MCH: 28.6 pg (ref 26.0–34.0)
MCHC: 33.3 g/dL (ref 30.0–36.0)
MCV: 85.9 fL (ref 80.0–100.0)
Monocytes Absolute: 0.5 K/uL (ref 0.1–1.0)
Monocytes Relative: 7 %
Neutro Abs: 2.9 K/uL (ref 1.7–7.7)
Neutrophils Relative %: 42 %
Platelet Count: 209 K/uL (ref 150–400)
RBC: 5.04 MIL/uL (ref 4.22–5.81)
RDW: 12.7 % (ref 11.5–15.5)
WBC Count: 6.9 K/uL (ref 4.0–10.5)
nRBC: 0 % (ref 0.0–0.2)

## 2024-01-27 LAB — CMP (CANCER CENTER ONLY)
ALT: 21 U/L (ref 0–44)
AST: 23 U/L (ref 15–41)
Albumin: 4.4 g/dL (ref 3.5–5.0)
Alkaline Phosphatase: 84 U/L (ref 38–126)
Anion gap: 9 (ref 5–15)
BUN: 14 mg/dL (ref 6–20)
CO2: 25 mmol/L (ref 22–32)
Calcium: 10 mg/dL (ref 8.9–10.3)
Chloride: 104 mmol/L (ref 98–111)
Creatinine: 0.96 mg/dL (ref 0.61–1.24)
GFR, Estimated: >60 mL/min (ref >60)
Glucose, Bld: 103 mg/dL — ABNORMAL HIGH (ref 70–99)
Potassium: 4.5 mmol/L (ref 3.5–5.1)
Sodium: 138 mmol/L (ref 135–145)
Total Bilirubin: 0.5 mg/dL (ref 0.0–1.2)
Total Protein: 7.3 g/dL (ref 6.5–8.1)

## 2024-01-27 LAB — LACTATE DEHYDROGENASE: LDH: 137 U/L (ref 98–192)

## 2024-01-27 MED ORDER — ACYCLOVIR 400 MG PO TABS: 400.0000 mg | ORAL_TABLET | Freq: Two times a day (BID) | ORAL | 0 refills | Status: DC

## 2024-01-27 MED ORDER — LENALIDOMIDE 10 MG PO CAPS: 10.0000 mg | ORAL_CAPSULE | Freq: Every day | ORAL | 0 refills | Status: DC

## 2024-01-27 NOTE — Progress Notes (Signed)
 [facility] [phone]   [phone]  PROGRESS NOTE  Patient Care Team: Tanda Bleacher, MD as PCP - General (Family Medicine) Court Dorn PARAS, MD as PCP - Cardiology (Cardiology) Shaaron Lamar HERO, MD as Attending Physician (Gastroenterology)  Hematological/Oncological History # IgG Kappa Multiple Myeloma, 1p32/1q21.  01/04/2022: MRI lumbar spine shows diffuse heterogeneous marrow signal with heterogeneous enhancement throughout the thoracolumbar spine.  Additionally, there is subacute-chronic L3 vertebral body compression fraction. 01/14/2022: establish care with Dr. Federico  02/10/2022: bone marrow biopsy shows range of plasma cell involvement from 50% on the biopsy to 60% on aspirate smear slides and close to 90% on the clot section for an overall percentage estimated at approximately 80% overall. 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  10/02/2022: Cycle 1 Day 1 of Dara/Rev/Dex 11/03/2022: Cycle 2 Day 1 of Dara/Rev/Dex 11/30/2022:  Cycle 3 Day 1 of Dara/Rev/Dex 12/29/2022: Cycle 4 Day 1 of Dara/Rev/Dex 01/26/2023: Cycle 5 Day 1 of Dara/Rev/Dex 02/23/2023: Cycle 6 Day 1 of Dara/Rev/Dex 03/23/2023: Cycle 7 Day 1 of Dara/Rev/Dex 04/20/2023: Cycle 8 Day 1 of Dara/Rev/Dex 05/19/2023: Cycle 9 Day 1 of Dara/Rev/Dex 1/11-1/17/2025: mobilization and stem cell collection  06/15/2023: Cycle 10 Day 1 of Dara/Rev/Dex 07/14/2023: Cycle 11 Day 1 of  Dara/Rev/Dex 08/04/2023: Underwent autologous stem cell transplant at [facility] on 08/04/2023.   Interval History:  [name] 53 y.o. male for continued management of IgG kappa multiple myeloma. The patient's last visit was on 10/14/2023 and in the interim, he denies any changes to his health.   On exam today [name] reports he is doing well  without any new or concerning symptoms. He reports having  some low back muscle pain that improves with rest and heat. He denies any new bone pain. He reports his appetite and energy levels are stable. He denies nausea, vomiting or bowel habit changes. He denies fevers, chills, sweats, shortness of breath, chest pain, or cough.  A full 10 point ROS was otherwise negative.  MEDICAL HISTORY:  Past Medical History:  Diagnosis Date   Allergy    Anemia    Atypical chest pain    Cancer (HCC)    Multiple myeloma with normocytic anemia   GERD (gastroesophageal reflux disease)    Hepatic cyst    Benign by MRI   Hiatal hernia    History of colonic polyps    Hyperlipidemia    Hypertension    Rectal bleeding    Tobacco abuse     SURGICAL HISTORY: Past Surgical History:  Procedure Laterality Date   COLONOSCOPY  01/10/2009     RMR: Normal rectum, normal colon; repeat in 2015 due to FH of colon cancer   COLONOSCOPY N/A 01/09/2014   MFM:Floupeoz colonic polyps-removed as described   COLONOSCOPY N/A 09/18/2016   Procedure: COLONOSCOPY;  Surgeon: Lamar HERO Shaaron, MD;  Location: [facility];  Service: Endoscopy;  Laterality: N/A;  730    ESOPHAGOGASTRODUODENOSCOPY  10/04/2007  RMR: Distal esophageal erosions consistent with erosive reflux esophagitis, patulous gastroesophageal junction status post passage of a  Maloney dilator, 56 [place].  Otherwise, unremarkable esophagus.  Hiatal hernia.  Otherwise normal stomach.  Bulbar erosion   ESOPHAGOGASTRODUODENOSCOPY N/A 01/09/2014   Erosive reflux esophagitis. Small hiatal hernia   HUMERUS IM NAIL Left  02/22/2022   Procedure: INTRAMEDULLARY (IM) NAIL HUMERAL;  Surgeon: Sharl Selinda Dover, MD;  Location: [facility];  Service: Orthopedics;  Laterality: Left;   I & D EXTREMITY Left 07/26/2018   Procedure: IRRIGATION AND DEBRIDEMENT EXTREMITY;  Surgeon: Lorretta Dess, MD;  Location: [facility] OR;  Service: Plastics;  Laterality: Left;   IR BONE TUMOR(S)RF ABLATION  02/05/2022   IR BONE TUMOR(S)RF ABLATION  02/24/2022   IR KYPHO EA ADDL LEVEL THORACIC OR LUMBAR  02/19/2022   IR KYPHO LUMBAR INC FX REDUCE BONE BX UNI/BIL CANNULATION INC/IMAGING  02/05/2022   IR KYPHO LUMBAR INC FX REDUCE BONE BX UNI/BIL CANNULATION INC/IMAGING  02/19/2022   PERCUTANEOUS PINNING Left 07/26/2018   Procedure: PERCUTANEOUS PINNING EXTREMITY;  Surgeon: Lorretta Dess, MD;  Location: [facility] OR;  Service: Plastics;  Laterality: Left;    SOCIAL HISTORY: Social History   Socioeconomic History   Marital status: Divorced    Spouse name: Not on file   Number of children: 2   Years of education: Not on file   Highest education level: Not on file  Occupational History   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[employer]    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[employer]  Tobacco Use   Smoking status: Former    Current packs/day: 0.00    Average packs/day: 0.3 packs/day for 10.0 years (2.5 ttl pk-yrs)    Types: Cigarettes    Start date: 12/25/2011    Quit date: 12/24/2021    Years since quitting: 2.0   Smokeless tobacco: Never  Vaping Use   Vaping status: Never Used  Substance and Sexual Activity   Alcohol use: Yes    Alcohol/week: 0.0 standard drinks of alcohol    Comment: occasional/wine on the weekends   Drug use: No   Sexual activity: Not on file  Other Topics Concern   Not on file  Social History Narrative   Right handed   Lives alone one story home   Social Drivers of Health   Financial Resource Strain: Low Risk  (02/13/2023)   Overall Financial Resource Strain (CARDIA)    Difficulty of Paying Living Expenses: Not very hard   Food Insecurity: Low Risk  (08/09/2023)   Received from [facility]   Hunger Vital Sign    Within the past 12 months, you worried that your food would run out before you got money to buy more: Never true    Within the past 12 months, the food you bought just didn't last and you didn't have money to get more. : Never true  Transportation Needs: No Transportation Needs (08/09/2023)   Received from [facility]    In the past 12 months, has lack of reliable transportation kept you from medical appointments, meetings, work or from getting things needed for daily living? : No  Physical Activity: Inactive (02/13/2023)   Exercise Vital Sign    Days of Exercise per Week: 0 days    Minutes of Exercise per Session: 0 min  Stress: No Stress Concern Present (02/13/2023)   [employer] of Occupational Health - Occupational Stress Questionnaire    Feeling of Stress : Not at all  Social  Connections: Unknown (02/13/2023)   Social Connection and Isolation Panel    Frequency of Communication with Friends and Family: More than three times a week    Frequency of Social Gatherings with Friends and Family: More than three times a week    Attends Religious Services: More than 4 times per year    Active Member of [employer] or Organizations: No    Attends Banker Meetings: Never    Marital Status: Not on file  Intimate Partner Violence: Not At Risk (07/17/2022)   Humiliation, Afraid, Rape, and Kick questionnaire    Fear of Current or Ex-Partner: No    Emotionally Abused: No    Physically Abused: No    Sexually Abused: No    FAMILY HISTORY: Family History  Problem Relation Age of Onset   Heart disease Mother    Hypertension Mother    Diabetes Father    Hypertension Father    Colon cancer Sister        passed away from colon cancer, in her 94s   Heart attack Maternal Uncle    Prostate cancer Maternal Uncle    Diabetes Other    Pancreatic cancer Neg Hx    Rectal cancer  Neg Hx    Esophageal cancer Neg Hx    Stomach cancer Neg Hx     ALLERGIES:  has no known allergies.  MEDICATIONS:  Current Outpatient Medications  Medication Sig Dispense Refill   acyclovir  (ZOVIRAX ) 400 MG tablet Take 1 tablet by mouth twice daily 60 tablet 0   gabapentin  (NEURONTIN ) 100 MG capsule Take 1 capsule by mouth twice daily 60 capsule 0   Multiple Vitamin (MULTIVITAMIN) tablet Take 1 tablet by mouth daily.     pantoprazole  (PROTONIX ) 40 MG tablet Take 1 tablet by mouth twice daily 60 tablet 6   rosuvastatin  (CRESTOR ) 10 MG tablet Take 1 tablet by mouth once daily 90 tablet 0   sildenafil  (VIAGRA ) 50 MG tablet TAKE 1 TABLET BY MOUTH ONCE DAILY AS NEEDED FOR ERECTILE DYSFUNCTION 10 tablet 0   allopurinol  (ZYLOPRIM ) 300 MG tablet Take 1 tablet (300 mg total) by mouth daily. (Patient not taking: Reported on 01/27/2024) 30 tablet 3   cetirizine  (ZYRTEC ) 10 MG tablet Take 10 mg by mouth daily as needed for allergies. (Patient not taking: Reported on 08/25/2023)     docusate sodium  (COLACE) 100 MG capsule Take 1 capsule (100 mg total) by mouth 2 (two) times daily as needed for mild constipation. (Patient not taking: Reported on 08/25/2023) 30 capsule 3   DULoxetine  (CYMBALTA ) 20 MG capsule Take 1 capsule by mouth once daily (Patient not taking: Reported on 08/25/2023) 90 capsule 0   hydrochlorothiazide  (HYDRODIURIL ) 25 MG tablet Take 1 tablet by mouth once daily (Patient not taking: Reported on 01/27/2024) 90 tablet 0   lenalidomide  (REVLIMID ) 10 MG capsule Take 1 capsule (10 mg total) by mouth daily. [employer] Auth # [id number]     Date Obtained 01/27/24 Take one capsule daily for 21 days and then none for 7 days 21 capsule 0   lisinopril  (ZESTRIL ) 40 MG tablet Take 1 tablet by mouth once daily (Patient not taking: Reported on 01/27/2024) 90 tablet 0   metoprolol  tartrate (LOPRESSOR ) 50 MG tablet 50 mg by oral route. (Patient not taking: Reported on 08/25/2023)     Na Sulfate-K Sulfate-Mg Sulf 17.5-3.13-1.6  GM/177ML SOLN SMARTSIG:1 Kit(s) By Mouth Once (Patient not taking: Reported on 08/25/2023)     ondansetron  (ZOFRAN ) 8 MG tablet Take 1  tablet (8 mg total) by mouth every 8 (eight) hours as needed for nausea or vomiting. 60 tablet 3   polyethylene glycol powder (GLYCOLAX /MIRALAX ) 17 GM/SCOOP powder Take 1 capful (17 g) with water  by mouth daily. (Patient not taking: Reported on 08/25/2023) 238 g 0   No current facility-administered medications for this visit.    REVIEW OF SYSTEMS:   Constitutional: ( - ) fevers, ( - )  chills , ( - ) night sweats Eyes: ( - ) blurriness of vision, ( - ) double vision, ( - ) watery eyes Ears, nose, mouth, throat, and face: ( - ) mucositis, ( - ) sore throat Respiratory: ( - ) cough, ( - ) dyspnea, ( - ) wheezes Cardiovascular: ( - ) palpitation, ( - ) chest discomfort, ( - ) lower extremity swelling Gastrointestinal:  ( - ) nausea, ( -) heartburn, ( - ) change in bowel habits Skin: ( - ) abnormal skin rashes Lymphatics: ( - ) new lymphadenopathy, ( - ) easy bruising Neurological: ( - ) numbness, ( - ) tingling, ( - ) new weaknesses Behavioral/Psych: ( - ) mood change, ( - ) new changes  All other systems were reviewed with the patient and are negative.  PHYSICAL EXAMINATION: ECOG PERFORMANCE STATUS: 1 - Symptomatic but completely ambulatory  Vitals:   01/27/24 1151  BP: (!) 140/94  Pulse: 88  Resp: 18  Temp: (!) 97.5 F (36.4 C)  SpO2: 97%   Filed Weights   01/27/24 1151  Weight: 244 lb 14.4 oz (111.1 kg)    GENERAL: Well-appearing young African-American male, alert, no distress and comfortable SKIN: skin color, texture, turgor are normal, no rashes or significant lesions EYES: conjunctiva are pink and non-injected, sclera clear. LUNGS: clear to auscultation and percussion with normal breathing effort HEART: regular rate & rhythm and no murmurs and no lower extremity edema Musculoskeletal: no cyanosis of digits and no clubbing  PSYCH: alert &  oriented x 3, fluent speech NEURO: no focal motor/sensory deficits  LABORATORY DATA:  I have reviewed the data as listed    Latest Ref Rng & Units 01/27/2024   11:27 AM 10/14/2023    8:13 AM 07/14/2023   10:51 AM  CBC  WBC 4.0 - 10.5 K/uL 6.9  6.2  5.8   Hemoglobin 13.0 - 17.0 g/dL 85.5  86.6  86.7   Hematocrit 39.0 - 52.0 % 43.3  39.9  40.4   Platelets 150 - 400 K/uL 209  178  257        Latest Ref Rng & Units 01/27/2024   11:27 AM 10/14/2023    8:13 AM 07/14/2023   10:51 AM  CMP  Glucose 70 - 99 mg/dL 896  895  89   BUN 6 - 20 mg/dL 14  18  11    Creatinine 0.61 - 1.24 mg/dL 9.03  8.89  9.00   Sodium 135 - 145 mmol/L 138  136  137   Potassium 3.5 - 5.1 mmol/L 4.5  4.0  3.9   Chloride 98 - 111 mmol/L 104  104  102   CO2 22 - 32 mmol/L 25  24  29    Calcium  8.9 - 10.3 mg/dL 89.9  9.3  9.8   Total Protein 6.5 - 8.1 g/dL 7.3  6.9  7.1   Total Bilirubin 0.0 - 1.2 mg/dL 0.5  0.7  0.4   Alkaline Phos 38 - 126 U/L 84  57  72   AST 15 - 41 U/L  23  24  12    ALT 0 - 44 U/L 21  24  13      Lab Results  Component Value Date   MPROTEIN Not Observed 10/14/2023   MPROTEIN Not Observed 07/14/2023   MPROTEIN Not Observed 06/15/2023   Lab Results  Component Value Date   KPAFRELGTCHN 5.5 10/14/2023   KPAFRELGTCHN 9.6 07/14/2023   KPAFRELGTCHN 10.7 06/15/2023   LAMBDASER 2.1 (L) 10/14/2023   LAMBDASER 6.1 07/14/2023   LAMBDASER 6.7 06/15/2023   KAPLAMBRATIO 2.62 (H) 10/14/2023   KAPLAMBRATIO 1.57 07/14/2023   KAPLAMBRATIO 1.60 06/15/2023   RADIOGRAPHIC STUDIES: No results found.  ASSESSMENT & PLAN ANTHONE PRIEUR 53 y.o. male with medical history significant for IgG kappa multiple myeloma who presents for a follow up visit.   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 -- Recommend VRD chemotherapy with intention of proceeding to  transplant. -- Labs at each visit to include CBC, CMP, LDH with monthly restaging labs SPEP and serum free light chains -- Started VRD therapy on 02/26/2022. Held Velcade  therapy on 09/24/2022 due to development of ocular toxicity. --Switched to Dara/Rev/Dex on 10/02/2022. Last dose on 07/13/2023. --Underwent bone marrow transplant at [facility] on 08/04/2023.  PLAN: --Labs from today were reviewed and require no intervention. WBC 6.9, Hgb 14.4, Plt 209, creatinine and calcium  normal. Myeloma panel pending --Last bone marrow biopsy from 11/16/2023 demonstrates a normocellular marrow with no increase in plasma cells (2%), confirmed by CD138 immunohistochemistry. There is no morphologic evidence of involvement by plasma cell neoplasm  --Recommended maintenance therapy with daratumumab  plus Revlimid  --Plan to resume Xgeva  therapy till November 2025 to complete 2 years of therapy.  --Tentative return in 1-2 weeks to start maintenance Dara/Rev.   #Erectile Dysfunction -- Likely secondary to chemotherapy. --prescribed Viagra  50 mg p.o. as needed x 10 pills.  --Patient notes he is not getting the results he would like, encouraged him to double the dose to Viagra  100 mg p.o. to see if that is more effective. -- If ineffective could consider Cialis.  # H/O Leg Discomfort/Restless Leg -- Patient provided with Requip  by the emergency department, discontinued due to poor efficacy. --Evaluated by Dr. Buckley on 07/28/2022. Recommended to short course of steroids so started prednisone  50 mg daily x 7 days. --Discussed etiologies including iron deficiency so iron levels were checked and was normal.   #Pathologic fractures-secondary to MM: --Involving L1, L2 and L3 compression fracture and left humerus fracture.  --Underwent kyphoplasty of L3 fracture on 02/19/2022 --Underwent medullary nailing of left humeral shaft on 02/22/2022 --Underwent kyphoplasty on 06/04/2022.   #Pain Medication -- Per palliative care patient  has weaned off MS contin . Currently conitnues on gabapentin  100 mg BID as needed --Under the care of palliative care team.   #Supportive Care -- chemotherapy education complete -- port placement not required -- zofran  8mg  q8H PRN and compazine  10mg  PO q6H for nausea -- acyclovir  400mg  PO BID for VCZ prophylaxis. Sent refill today -- allopurinol  300mg  PO daily for TLS prophylaxis  No orders of the defined types were placed in this encounter.   All questions were answered. The patient knows to call the clinic with any problems, questions or concerns.  I have spent a total of 30 minutes minutes of face-to-face and non-face-to-face time, preparing to see the patient,performing a medically appropriate examination, counseling and educating the patient,  documenting clinical information in the electronic health record,  and care coordination.  Johnston Police PA-C Dept of Hematology and Oncology [facility] at [facility] [address] Phone: [phone]

## 2024-01-28 ENCOUNTER — Encounter: Payer: Self-pay | Admitting: Hematology and Oncology

## 2024-01-28 ENCOUNTER — Other Ambulatory Visit (HOSPITAL_COMMUNITY): Payer: Self-pay

## 2024-01-28 ENCOUNTER — Telehealth: Payer: Self-pay

## 2024-01-28 ENCOUNTER — Other Ambulatory Visit: Payer: Self-pay

## 2024-01-28 ENCOUNTER — Encounter: Payer: Self-pay | Admitting: Hematology

## 2024-01-28 LAB — KAPPA/LAMBDA LIGHT CHAINS
Kappa free light chain: 6 mg/L (ref 3.3–19.4)
Kappa, lambda light chain ratio: 2.07 — ABNORMAL HIGH (ref 0.26–1.65)
Lambda free light chains: 2.9 mg/L — ABNORMAL LOW (ref 5.7–26.3)

## 2024-01-28 NOTE — Telephone Encounter (Addendum)
 PGY2 Oncology Pharmacy Resident Encounter - Oral Chemo Assessment  Received new prescription for Revlimid  (lenalidomide ) for the treatment of Multiple Myeloma post ASCT in conjunction with daratumumab , planned duration until unacceptable toxicity or disease progression.  Oral Chemo Regimen  Medication/dosing schedule: Revlimid  (lenalidomide ) 10 mg daily REMs program: Yes, [employer] Auth obtained 01/27/2024  Prescription dose and frequency assessed for appropriateness: Yes   Labs/Baseline Monitoring   Required baseline labs: CBC, CMP  Labs from 01/27/2024 assessed, no relevant lab abnormalities identified.  Renal/hepatic function: WNL Plan: Dosing appropriate based on labs and indication   Current Medications  Current medication list reviewed in Epic No relevant DDIs associated with Revlimid  (lenalidomide ) identified Actions required: None at this time  Supportive Care Needs Yes: Nausea/Vomiting Ondansetron  8 mg every 8 hours PRN No: TLS Not indicated at this time  Infection/VTE ppx Needs Yes: HSV ppx Acyclovir  400 mg BID  Yes: VTE ppx: SAVED VTE score 0 (Low Risk)  Aspirin  81 mg, per patient they are planning to restart; aware they need to be on a baby aspirin  while on Revlimid   Logistics and Coordination  Evaluated chart and no patient barriers to medication adherence identified.  Prescription has been e-scribed to the [facility] for benefits analysis and approval. Oral Oncology Clinic will continue to follow for insurance authorization, copayment issues, initial counseling and start date.  Thank you for allowing pharmacy to participate in this patient's care.  Alfonso MARLA Buys, PharmD Pharmacy Resident  01/28/2024 9:30 AM

## 2024-01-30 ENCOUNTER — Emergency Department (HOSPITAL_COMMUNITY)

## 2024-01-30 ENCOUNTER — Emergency Department (HOSPITAL_COMMUNITY): Admission: EM | Admit: 2024-01-30 | Discharge: 2024-01-30 | Disposition: A | Discharge status: Home / Self Care

## 2024-01-30 ENCOUNTER — Other Ambulatory Visit: Payer: Self-pay

## 2024-01-30 ENCOUNTER — Encounter (HOSPITAL_COMMUNITY): Payer: Self-pay

## 2024-01-30 DIAGNOSIS — G8929 Other chronic pain: Secondary | ICD-10-CM | POA: Insufficient documentation

## 2024-01-30 DIAGNOSIS — M545 Low back pain, unspecified: Secondary | ICD-10-CM | POA: Insufficient documentation

## 2024-01-30 LAB — URINALYSIS, ROUTINE W REFLEX MICROSCOPIC
Bilirubin Urine: NEGATIVE
Glucose, UA: NEGATIVE mg/dL
Hgb urine dipstick: NEGATIVE
Ketones, ur: NEGATIVE mg/dL
Leukocytes,Ua: NEGATIVE
Nitrite: NEGATIVE
Protein, ur: NEGATIVE mg/dL
Specific Gravity, Urine: 1.018 (ref 1.005–1.030)
pH: 5 (ref 5.0–8.0)

## 2024-01-30 LAB — BASIC METABOLIC PANEL WITH GFR
Anion gap: 14 (ref 5–15)
BUN: 13 mg/dL (ref 6–20)
CO2: 22 mmol/L (ref 22–32)
Calcium: 10 mg/dL (ref 8.9–10.3)
Chloride: 101 mmol/L (ref 98–111)
Creatinine, Ser: 1.05 mg/dL (ref 0.61–1.24)
GFR, Estimated: >60 mL/min (ref >60)
Glucose, Bld: 96 mg/dL (ref 70–99)
Potassium: 4.7 mmol/L (ref 3.5–5.1)
Sodium: 137 mmol/L (ref 135–145)

## 2024-01-30 LAB — CBC WITH DIFFERENTIAL/PLATELET
Abs Immature Granulocytes: 0.13 K/uL — ABNORMAL HIGH (ref 0.00–0.07)
Basophils Absolute: 0 K/uL (ref 0.0–0.1)
Basophils Relative: 1 %
Eosinophils Absolute: 0.1 K/uL (ref 0.0–0.5)
Eosinophils Relative: 1 %
HCT: 45.4 % (ref 39.0–52.0)
Hemoglobin: 14.3 g/dL (ref 13.0–17.0)
Immature Granulocytes: 2 %
Lymphocytes Relative: 27 %
Lymphs Abs: 2.1 K/uL (ref 0.7–4.0)
MCH: 28.1 pg (ref 26.0–34.0)
MCHC: 31.5 g/dL (ref 30.0–36.0)
MCV: 89.2 fL (ref 80.0–100.0)
Monocytes Absolute: 0.7 K/uL (ref 0.1–1.0)
Monocytes Relative: 9 %
Neutro Abs: 4.7 K/uL (ref 1.7–7.7)
Neutrophils Relative %: 60 %
Platelets: 224 K/uL (ref 150–400)
RBC: 5.09 MIL/uL (ref 4.22–5.81)
RDW: 13.1 % (ref 11.5–15.5)
WBC: 7.7 K/uL (ref 4.0–10.5)
nRBC: 0 % (ref 0.0–0.2)

## 2024-01-30 MED ORDER — HYDROMORPHONE HCL 1 MG/ML IJ SOLN
1.0000 mg | Freq: Once | INTRAMUSCULAR | Status: AC
  Administered 2024-01-30: 1 mg via INTRAVENOUS

## 2024-01-30 MED ORDER — ACETAMINOPHEN 500 MG PO TABS
1000.0000 mg | ORAL_TABLET | Freq: Once | ORAL | Status: AC
  Administered 2024-01-30: 1000 mg via ORAL
  Filled 2024-01-30: qty 2

## 2024-01-30 MED ORDER — LIDOCAINE 5 % EX PTCH: 1.0000 | MEDICATED_PATCH | CUTANEOUS | 0 refills | Status: DC

## 2024-01-30 MED ORDER — OXYCODONE HCL 5 MG PO TABS: 5.0000 mg | ORAL_TABLET | ORAL | 0 refills | Status: DC | PRN

## 2024-01-30 MED ORDER — HYDROMORPHONE HCL 1 MG/ML IJ SOLN
1.0000 mg | Freq: Once | INTRAMUSCULAR | Status: DC
  Filled 2024-01-30: qty 1

## 2024-01-30 MED ORDER — HYDROMORPHONE HCL 1 MG/ML IJ SOLN
1.0000 mg | Freq: Once | INTRAMUSCULAR | Status: AC
  Administered 2024-01-30: 1 mg via INTRAVENOUS
  Filled 2024-01-30: qty 1

## 2024-01-30 MED ORDER — FENTANYL CITRATE PF 50 MCG/ML IJ SOSY
50.0000 ug | PREFILLED_SYRINGE | Freq: Once | INTRAMUSCULAR | Status: AC
  Administered 2024-01-30: 50 ug via INTRAVENOUS
  Filled 2024-01-30: qty 1

## 2024-01-30 MED ORDER — LIDOCAINE 5 % EX PTCH
1.0000 | MEDICATED_PATCH | CUTANEOUS | Status: DC
  Administered 2024-01-30: 1 via TRANSDERMAL
  Filled 2024-01-30: qty 1

## 2024-01-30 NOTE — Discharge Instructions (Signed)
 Short course of pain medicine sent into your pharmacy.  Primarily take ibuprofen  600 mg 3 times a day, Tylenol  1000 mg every 6 hours, continue taking the Robaxin  twice a day.  Do not drive after taking Robaxin  or the pain medication that I have sent in as these will make you drowsy.  I have sent in a short course of pain medicine.  Use this for severe breakthrough pain.  Follow-up with your oncologist. Your appendix was slightly enlarged from previous scan that she had back in February 2022.  I spoke to the surgeon.  They are not worried about appendicitis at this time given no white count, and you are without a fever.  But they do want to follow-up with you in the office.  Please call and schedule this appointment.  I have attached information above.  Return for any emergent symptoms.

## 2024-01-30 NOTE — ED Provider Notes (Signed)
 [place] [facility] AT [facility] Provider Note   CSN: [id number] Arrival date & time: 01/30/24  1442     Patient presents with: Back Pain   [name] is a 53 y.o. male.   53 year old male with history of multiple myeloma presents today for concern of low back pain.  He does have history of chronic low back pain secondary to multiple myeloma but stated that this was worse than usual.  It started in the right flank and radiated around to his right anterior abdomen.  No associated nausea, vomiting.  Denies fever.  Took gabapentin  and a dose of Robaxin  1 hour prior to arrival without significant relief.  The history is provided by the patient. No language interpreter was used.       Prior to Admission medications   Medication Sig Start Date End Date Taking? Authorizing Provider  acyclovir  (ZOVIRAX ) 400 MG tablet Take 1 tablet (400 mg total) by mouth 2 (two) times daily. 01/27/24   Thayil, Irene T, PA-C  allopurinol  (ZYLOPRIM ) 300 MG tablet Take 1 tablet (300 mg total) by mouth daily. Patient not taking: Reported on 01/27/2024 07/14/23   Thayil, Irene T, PA-C  cetirizine  (ZYRTEC ) 10 MG tablet Take 10 mg by mouth daily as needed for allergies. Patient not taking: Reported on 08/25/2023    [provider]  docusate sodium  (COLACE) 100 MG capsule Take 1 capsule (100 mg total) by mouth 2 (two) times daily as needed for mild constipation. Patient not taking: Reported on 08/25/2023 07/01/22   Pickenpack-Cousar, Athena N, NP  DULoxetine  (CYMBALTA ) 20 MG capsule Take 1 capsule by mouth once daily Patient not taking: Reported on 08/25/2023 07/03/23   Tanda Bleacher, MD  gabapentin  (NEURONTIN ) 100 MG capsule Take 1 capsule by mouth twice daily 11/26/23   Pickenpack-Cousar, Athena N, NP  hydrochlorothiazide  (HYDRODIURIL ) 25 MG tablet Take 1 tablet by mouth once daily Patient not taking: Reported on 01/27/2024 09/14/23   Tanda Bleacher, MD  lenalidomide  (REVLIMID ) 10 MG capsule  Take 1 capsule (10 mg total) by mouth daily. Bertrum Barrows # [id number]     Date Obtained 01/27/24 Take one capsule daily for 21 days and then none for 7 days 01/27/24   Federico Norleen ONEIDA MADISON, MD  lisinopril  (ZESTRIL ) 40 MG tablet Take 1 tablet by mouth once daily Patient not taking: Reported on 01/27/2024 09/02/23   Tanda Bleacher, MD  metoprolol  tartrate (LOPRESSOR ) 50 MG tablet 50 mg by oral route. Patient not taking: Reported on 08/25/2023 02/24/22   [provider]  Multiple Vitamin (MULTIVITAMIN) tablet Take 1 tablet by mouth daily.    [provider]  Na Sulfate-K Sulfate-Mg Sulf 17.5-3.13-1.6 GM/177ML SOLN SMARTSIG:1 Kit(s) By Mouth Once Patient not taking: Reported on 08/25/2023 10/01/22   [provider]  ondansetron  (ZOFRAN ) 8 MG tablet Take 1 tablet (8 mg total) by mouth every 8 (eight) hours as needed for nausea or vomiting. 12/29/22   Federico Norleen ONEIDA MADISON, MD  pantoprazole  (PROTONIX ) 40 MG tablet Take 1 tablet by mouth twice daily 02/25/23   Pickenpack-Cousar, Athena N, NP  polyethylene glycol powder (GLYCOLAX /MIRALAX ) 17 GM/SCOOP powder Take 1 capful (17 g) with water  by mouth daily. Patient not taking: Reported on 08/25/2023 02/24/22   Singh, Prashant K, MD  rosuvastatin  (CRESTOR ) 10 MG tablet Take 1 tablet by mouth once daily 01/18/24   Tanda Bleacher, MD  sildenafil  (VIAGRA ) 50 MG tablet TAKE 1 TABLET BY MOUTH ONCE DAILY AS NEEDED FOR ERECTILE DYSFUNCTION 11/27/23  Federico Norleen ONEIDA MADISON, MD    Allergies: Patient has no known allergies.    Review of Systems  Constitutional:  Negative for chills and fever.  Gastrointestinal:  Positive for abdominal pain.  Genitourinary:  Negative for difficulty urinating.  Musculoskeletal:  Positive for back pain.  Neurological:  Negative for light-headedness.  All other systems reviewed and are negative.   Updated Vital Signs BP (!) 157/107 (BP Location: Right Arm)   Pulse 82   Temp 98.1 F (36.7 C) (Oral)   Resp 18   Ht 5' 9 (1.753 m)   Wt  111.1 kg   SpO2 97%   BMI 36.18 kg/m   Physical Exam Vitals and nursing note reviewed.  Constitutional:      General: He is not in acute distress.    Appearance: Normal appearance. He is not ill-appearing.  HENT:     Head: Normocephalic and atraumatic.     Nose: Nose normal.  Eyes:     Conjunctiva/sclera: Conjunctivae normal.  Cardiovascular:     Rate and Rhythm: Normal rate and regular rhythm.  Pulmonary:     Effort: Pulmonary effort is normal. No respiratory distress.  Abdominal:     General: There is no distension.     Palpations: Abdomen is soft.     Tenderness: There is no abdominal tenderness. There is no right CVA tenderness, left CVA tenderness, guarding or rebound.  Musculoskeletal:        General: No deformity. Normal range of motion.     Cervical back: Normal range of motion.  Skin:    Findings: No rash.  Neurological:     Mental Status: He is alert.     (all labs ordered are listed, but only abnormal results are displayed) Labs Reviewed  CBC WITH DIFFERENTIAL/PLATELET - Abnormal; Notable for the following components:      Result Value   Abs Immature Granulocytes 0.13 (*)    All other components within normal limits  BASIC METABOLIC PANEL WITH GFR  URINALYSIS, ROUTINE W REFLEX MICROSCOPIC    EKG: None  Radiology: CT L-SPINE NO CHARGE Result Date: 01/30/2024 CLINICAL DATA:  Intermittent sharp lower back pain. History of multiple myeloma. EXAM: CT LUMBAR SPINE WITHOUT CONTRAST TECHNIQUE: Multidetector CT imaging of the lumbar spine was performed without intravenous contrast administration. Multiplanar CT image reconstructions were also generated. RADIATION DOSE REDUCTION: This exam was performed according to the departmental dose-optimization program which includes automated exposure control, adjustment of the mA and/or kV according to patient size and/or use of iterative reconstruction technique. COMPARISON:  07/17/2022, 05/30/2022. FINDINGS: Segmentation: 5  lumbar type vertebral bodies. Alignment: Normal. Vertebrae: Stable compression deformities are noted from L1 through L5 in the lumbar spine and at T12, not significantly changed from previous exams. Kyphoplasty changes are noted from T12 through L4. No acute fracture is seen. Multiple lytic lesions are seen throughout the visualized thoracolumbar spine and sacrum. Paraspinal and other soft tissues: No acute abnormality. Disc levels: There is multilevel bulging of the disc annulus with facet arthropathy, most pronounced at L2-L3, L3-L4 and L4-L5 resulting in neural foraminal stenosis and mild-to-moderate spinal canal stenosis. IMPRESSION: 1. No acute fracture. 2. Scattered lytic lesions throughout the bones, compatible with history of multiple myeloma. 3. Stable compression deformities at T12-L5. 4. Multilevel degenerative changes. Electronically Signed   By: Leita Birmingham M.D.   On: 01/30/2024 17:00   CT Renal Stone Study Result Date: 01/30/2024 CLINICAL DATA:  Intermittent sharp lower back pain EXAM: CT ABDOMEN AND PELVIS  WITHOUT CONTRAST TECHNIQUE: Multidetector CT imaging of the abdomen and pelvis was performed following the standard protocol without IV contrast. RADIATION DOSE REDUCTION: This exam was performed according to the departmental dose-optimization program which includes automated exposure control, adjustment of the mA and/or kV according to patient size and/or use of iterative reconstruction technique. COMPARISON:  CT abdomen and pelvis dated 07/17/2022 FINDINGS: Lower chest: No focal consolidation or pulmonary nodule in the lung bases. No pleural effusion or pneumothorax demonstrated. Partially imaged heart size is normal. Hepatobiliary: Unchanged 1.6 cm segment 2 cyst (2:18). No intra or extrahepatic biliary ductal dilation. Normal gallbladder. Pancreas: No focal lesions or main ductal dilation. Spleen: Normal in size without focal abnormality. Adrenals/Urinary Tract: No adrenal nodules. No  suspicious renal mass on this noncontrast enhanced examination , calculi or hydronephrosis. No focal bladder wall thickening. Stomach/Bowel: Normal appearance of the stomach. No evidence of bowel wall thickening, distention, or inflammatory changes. Punctate radiodensity within a loop of left abdominal small bowel (2:48), likely ingested material. Colon is diffusely underdistended. Again seen is prominent appendix with focal dilation of the appendiceal tip measuring 16 mm (2:49). Vascular/Lymphatic: Aortic atherosclerosis. Again seen is linear hyperdensity extending into the IVC from the adjacent L2 vertebral body (2:42). No enlarged abdominal or pelvic lymph nodes. Reproductive: Prostate is unremarkable. Other: No free fluid, fluid collection, or free air. Musculoskeletal: Similar diffuse heterogeneity of the bone marrow, likely corresponding to multiple myeloma. Again seen is multilevel compression deformity of the thoracolumbar spine with prior vertebral augmentation of T10, T12-L4. IMPRESSION: 1. Prominent appendix with increased focal dilation of the appendiceal tip measuring 16 mm. Findings may reflect sequela of appendicitis or developing appendiceal mucocele. 2. Similar diffuse heterogeneity of the bone marrow, likely corresponding to multiple myeloma with unchanged compression fracture and multilevel vertebral augmentation. Similar migration of kyphoplasty cement into the IVC at the level of L2. 3.  Aortic Atherosclerosis (ICD10-I70.0). Electronically Signed   By: Limin  Xu M.D.   On: 01/30/2024 16:54     Procedures   Medications Ordered in the ED  lidocaine  (LIDODERM ) 5 % 1 patch (1 patch Transdermal Patch Applied 01/30/24 1843)  HYDROmorphone  (DILAUDID ) injection 1 mg (1 mg Intravenous Given 01/30/24 1606)  HYDROmorphone  (DILAUDID ) injection 1 mg (1 mg Intravenous Given 01/30/24 1714)  fentaNYL  (SUBLIMAZE ) injection 50 mcg (50 mcg Intravenous Given 01/30/24 1843)  acetaminophen  (TYLENOL ) tablet 1,000  mg (1,000 mg Oral Given 01/30/24 1842)    Clinical Course as of 01/30/24 1918  Sat Jan 30, 2024  1851 Patient would like another round of pain medicine. After this we will reevaluate and if he still has significant pain we will discuss admission otherwise we will discharge with multimodal pain control and close follow-up with his oncologist. [AA]  [phone] Spoke to general surgeon Dr. Brien regarding CT findings.  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  He recommends follow-up with no emergent surgical intervention.  Patient made aware of this. [AA]    Clinical Course User Index [AA] Hildegard Loge, PA-C                                 Medical Decision Making Amount and/or Complexity of Data Reviewed Labs: ordered. Radiology: ordered.  Risk OTC drugs. Prescription drug management.  53 year old male presents today for concern of lower back pain.  Exam is overall reassuring.  Abdomen is soft and nondistended and without tenderness to palpation.  No tenderness over the right lower quadrant.  Without CVA tenderness.  Will obtain labs, provide pain control.  UA without evidence of UTI.  CBC and BMP unremarkable.  CT renal stone study, CT L-spine showed chronic lytic lesions but no other acute bony abnormality.  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  Discussed with surgery as noted above in ED course.  Patient feels improved on reevaluation.  Discharged in stable condition. Discussed close follow-up with surgery office and his oncologist.  Final diagnoses:  Chronic bilateral low back pain without sciatica    ED Discharge Orders          Ordered    oxyCODONE  (ROXICODONE ) 5 MG immediate release tablet  Every 4 hours PRN        01/30/24 1925    lidocaine  (LIDODERM ) 5 %  Every 24 hours         01/30/24 1925               Hildegard Loge, PA-C 01/30/24 1925    Kammerer, Megan L, DO 01/30/24 2240

## 2024-01-30 NOTE — ED Triage Notes (Addendum)
 Pt presents to ED from home C/O intermittent sharp lower back pain. Denies radiation to legs. Denies loss of bowel/bladder control. Hx multiple myeloma, states completed tx but not in remission yet.

## 2024-01-31 ENCOUNTER — Emergency Department (HOSPITAL_COMMUNITY)
Admission: EM | Admit: 2024-01-31 | Discharge: 2024-01-31 | Disposition: A | Discharge status: Home / Self Care | Attending: Emergency Medicine | Admitting: Emergency Medicine

## 2024-01-31 ENCOUNTER — Encounter (HOSPITAL_COMMUNITY): Payer: Self-pay

## 2024-01-31 ENCOUNTER — Emergency Department (HOSPITAL_COMMUNITY)

## 2024-01-31 DIAGNOSIS — X501XXA Overexertion from prolonged static or awkward postures, initial encounter: Secondary | ICD-10-CM | POA: Diagnosis not present

## 2024-01-31 DIAGNOSIS — M545 Low back pain, unspecified: Secondary | ICD-10-CM | POA: Diagnosis present

## 2024-01-31 DIAGNOSIS — M62830 Muscle spasm of back: Secondary | ICD-10-CM | POA: Diagnosis not present

## 2024-01-31 LAB — CBC WITH DIFFERENTIAL/PLATELET
Abs Immature Granulocytes: 0.03 K/uL (ref 0.00–0.07)
Basophils Absolute: 0 K/uL (ref 0.0–0.1)
Basophils Relative: 1 %
Eosinophils Absolute: 0.1 K/uL (ref 0.0–0.5)
Eosinophils Relative: 1 %
HCT: 44.8 % (ref 39.0–52.0)
Hemoglobin: 13.8 g/dL (ref 13.0–17.0)
Immature Granulocytes: 1 %
Lymphocytes Relative: 36 %
Lymphs Abs: 2.3 K/uL (ref 0.7–4.0)
MCH: 27.8 pg (ref 26.0–34.0)
MCHC: 30.8 g/dL (ref 30.0–36.0)
MCV: 90.1 fL (ref 80.0–100.0)
Monocytes Absolute: 0.4 K/uL (ref 0.1–1.0)
Monocytes Relative: 6 %
Neutro Abs: 3.6 K/uL (ref 1.7–7.7)
Neutrophils Relative %: 55 %
Platelets: 195 K/uL (ref 150–400)
RBC: 4.97 MIL/uL (ref 4.22–5.81)
RDW: 13.1 % (ref 11.5–15.5)
WBC: 6.4 K/uL (ref 4.0–10.5)
nRBC: 0 % (ref 0.0–0.2)

## 2024-01-31 LAB — COMPREHENSIVE METABOLIC PANEL WITH GFR
ALT: 27 U/L (ref 0–44)
AST: 26 U/L (ref 15–41)
Albumin: 4.4 g/dL (ref 3.5–5.0)
Alkaline Phosphatase: 92 U/L (ref 38–126)
Anion gap: 13 (ref 5–15)
BUN: 13 mg/dL (ref 6–20)
CO2: 24 mmol/L (ref 22–32)
Calcium: 9.7 mg/dL (ref 8.9–10.3)
Chloride: 103 mmol/L (ref 98–111)
Creatinine, Ser: 1.14 mg/dL (ref 0.61–1.24)
GFR, Estimated: >60 mL/min (ref >60)
Glucose, Bld: 127 mg/dL — ABNORMAL HIGH (ref 70–99)
Potassium: 4.2 mmol/L (ref 3.5–5.1)
Sodium: 139 mmol/L (ref 135–145)
Total Bilirubin: 0.4 mg/dL (ref 0.0–1.2)
Total Protein: 6.7 g/dL (ref 6.5–8.1)

## 2024-01-31 MED ORDER — FENTANYL CITRATE PF 50 MCG/ML IJ SOSY
100.0000 ug | PREFILLED_SYRINGE | Freq: Once | INTRAMUSCULAR | Status: AC
  Administered 2024-01-31: 100 ug via INTRAVENOUS
  Filled 2024-01-31: qty 2

## 2024-01-31 MED ORDER — FENTANYL CITRATE PF 50 MCG/ML IJ SOSY
50.0000 ug | PREFILLED_SYRINGE | Freq: Once | INTRAMUSCULAR | Status: AC
  Administered 2024-01-31: 50 ug via INTRAVENOUS
  Filled 2024-01-31: qty 1

## 2024-01-31 MED ORDER — LIDOCAINE 5 % EX PTCH
1.0000 | MEDICATED_PATCH | CUTANEOUS | Status: DC
  Administered 2024-01-31: 1 via TRANSDERMAL
  Filled 2024-01-31: qty 1

## 2024-01-31 MED ORDER — LORAZEPAM 1 MG PO TABS
2.0000 mg | ORAL_TABLET | ORAL | Status: DC | PRN
  Administered 2024-01-31: 2 mg via ORAL
  Filled 2024-01-31: qty 2

## 2024-01-31 MED ORDER — ACETAMINOPHEN 500 MG PO TABS
1000.0000 mg | ORAL_TABLET | Freq: Once | ORAL | Status: AC
  Administered 2024-01-31: 1000 mg via ORAL
  Filled 2024-01-31: qty 2

## 2024-01-31 MED ORDER — DIAZEPAM 2 MG PO TABS: 2.0000 mg | ORAL_TABLET | Freq: Three times a day (TID) | ORAL | 0 refills | Status: DC | PRN

## 2024-01-31 MED ORDER — DIAZEPAM 5 MG/ML IJ SOLN
2.5000 mg | Freq: Once | INTRAMUSCULAR | Status: AC
  Administered 2024-01-31: 2.5 mg via INTRAVENOUS
  Filled 2024-01-31: qty 2

## 2024-01-31 NOTE — ED Triage Notes (Signed)
 Pt presents to ED from home C/O intermittent lower back pain. Seen yesterday for same.

## 2024-01-31 NOTE — Discharge Instructions (Addendum)
 We attempted MRI today but were unable to get this.  I have sent Valium  into the pharmacy and this will have a strong muscle relaxant effect.  Do not combine this with the Robaxin  but you can resume it after you are done with Valium .  Follow-up with your oncologist.  Schedule the appointment with general surgeon regarding the appendix we discussed yesterday.  Return for any concerning symptoms.

## 2024-01-31 NOTE — ED Provider Notes (Signed)
 [place] EMERGENCY DEPARTMENT AT [facility] Provider Note   CSN: [id number] Arrival date & time: 01/31/24  1301     Patient presents with: Back Pain   [name] is a 53 y.o. male.   53 year old male presents today for concern of worsening low back pain.  He was seen yesterday by this provider and ultimately had improvement with pain control.  He states he did well after discharge until  today.  He was walking down the hallway and he bent over to turn on the TV when suddenly he had sudden onset severe pain in the right side of his low back.  Has been persistent since.  Home medicines did not help so he came back for evaluation.  Denies any fever or other concerns.  The history is provided by the patient. No language interpreter was used.       Prior to Admission medications   Medication Sig Start Date End Date Taking? Authorizing Provider  acyclovir  (ZOVIRAX ) 400 MG tablet Take 1 tablet (400 mg total) by mouth 2 (two) times daily. 01/27/24   Thayil, Irene T, PA-C  allopurinol  (ZYLOPRIM ) 300 MG tablet Take 1 tablet (300 mg total) by mouth daily. Patient not taking: Reported on 01/27/2024 07/14/23   Thayil, Irene T, PA-C  cetirizine  (ZYRTEC ) 10 MG tablet Take 10 mg by mouth daily as needed for allergies. Patient not taking: Reported on 08/25/2023    [provider]  docusate sodium  (COLACE) 100 MG capsule Take 1 capsule (100 mg total) by mouth 2 (two) times daily as needed for mild constipation. Patient not taking: Reported on 08/25/2023 07/01/22   [place] Fannie SAILOR, NP  DULoxetine  (CYMBALTA ) 20 MG capsule Take 1 capsule by mouth once daily Patient not taking: Reported on 08/25/2023 07/03/23   Tanda Bleacher, MD  gabapentin  (NEURONTIN ) 100 MG capsule Take 1 capsule by mouth twice daily 11/26/23   Pickenpack-Cousar, Athena N, NP  hydrochlorothiazide  (HYDRODIURIL ) 25 MG tablet Take 1 tablet by mouth once daily Patient not taking: Reported on 01/27/2024 09/14/23    Tanda Bleacher, MD  lenalidomide  (REVLIMID ) 10 MG capsule Take 1 capsule (10 mg total) by mouth daily. Bertrum Barrows # [id number]     Date Obtained 01/27/24 Take one capsule daily for 21 days and then none for 7 days 01/27/24   Federico Norleen ONEIDA MADISON, MD  lidocaine  (LIDODERM ) 5 % Place 1 patch onto the skin daily. Remove & Discard patch within 12 hours or as directed by MD 01/30/24   Hildegard Loge, PA-C  lisinopril  (ZESTRIL ) 40 MG tablet Take 1 tablet by mouth once daily Patient not taking: Reported on 01/27/2024 09/02/23   Tanda Bleacher, MD  metoprolol  tartrate (LOPRESSOR ) 50 MG tablet 50 mg by oral route. Patient not taking: Reported on 08/25/2023 02/24/22   [provider]  Multiple Vitamin (MULTIVITAMIN) tablet Take 1 tablet by mouth daily.    [provider]  Na Sulfate-K Sulfate-Mg Sulf 17.5-3.13-1.6 GM/177ML SOLN SMARTSIG:1 Kit(s) By Mouth Once Patient not taking: Reported on 08/25/2023 10/01/22   [provider]  ondansetron  (ZOFRAN ) 8 MG tablet Take 1 tablet (8 mg total) by mouth every 8 (eight) hours as needed for nausea or vomiting. 12/29/22   Federico Norleen ONEIDA MADISON, MD  oxyCODONE  (ROXICODONE ) 5 MG immediate release tablet Take 1 tablet (5 mg total) by mouth every 4 (four) hours as needed for severe pain (pain score 7-10). 01/30/24   Hildegard Loge, PA-C  pantoprazole  (PROTONIX ) 40 MG tablet Take 1  tablet by mouth twice daily 02/25/23   Pickenpack-Cousar, Athena N, NP  polyethylene glycol powder (GLYCOLAX /MIRALAX ) 17 GM/SCOOP powder Take 1 capful (17 g) with water  by mouth daily. Patient not taking: Reported on 08/25/2023 02/24/22   Singh, Prashant K, MD  rosuvastatin  (CRESTOR ) 10 MG tablet Take 1 tablet by mouth once daily 01/18/24   Tanda Bleacher, MD  sildenafil  (VIAGRA ) 50 MG tablet TAKE 1 TABLET BY MOUTH ONCE DAILY AS NEEDED FOR ERECTILE DYSFUNCTION 11/27/23   Dorsey, John T IV, MD    Allergies: Patient has no known allergies.    Review of Systems  Constitutional:  Negative for chills and fever.   Respiratory:  Negative for shortness of breath.   Cardiovascular:  Negative for chest pain.  Gastrointestinal:  Negative for abdominal pain.  Musculoskeletal:  Positive for back pain.  All other systems reviewed and are negative.   Updated Vital Signs BP (!) 144/82 (BP Location: Left Arm)   Pulse 89   Temp 97.9 F (36.6 C) (Oral)   Resp 16   SpO2 98%   Physical Exam Vitals and nursing note reviewed.  Constitutional:      General: He is not in acute distress.    Appearance: Normal appearance. He is not ill-appearing.  HENT:     Head: Normocephalic and atraumatic.     Nose: Nose normal.  Eyes:     Conjunctiva/sclera: Conjunctivae normal.  Cardiovascular:     Rate and Rhythm: Normal rate and regular rhythm.  Pulmonary:     Effort: Pulmonary effort is normal. No respiratory distress.  Abdominal:     General: There is no distension.     Palpations: Abdomen is soft.     Tenderness: There is no abdominal tenderness. There is no guarding.  Musculoskeletal:        General: No deformity. Normal range of motion.     Cervical back: Normal range of motion.  Skin:    Findings: No rash.  Neurological:     Mental Status: He is alert.     (all labs ordered are listed, but only abnormal results are displayed) Labs Reviewed  CBC WITH DIFFERENTIAL/PLATELET  COMPREHENSIVE METABOLIC PANEL WITH GFR    EKG: None  Radiology: CT L-SPINE NO CHARGE Result Date: 01/30/2024 CLINICAL DATA:  Intermittent sharp lower back pain. History of multiple myeloma. EXAM: CT LUMBAR SPINE WITHOUT CONTRAST TECHNIQUE: Multidetector CT imaging of the lumbar spine was performed without intravenous contrast administration. Multiplanar CT image reconstructions were also generated. RADIATION DOSE REDUCTION: This exam was performed according to the departmental dose-optimization program which includes automated exposure control, adjustment of the mA and/or kV according to patient size and/or use of iterative  reconstruction technique. COMPARISON:  07/17/2022, 05/30/2022. FINDINGS: Segmentation: 5 lumbar type vertebral bodies. Alignment: Normal. Vertebrae: Stable compression deformities are noted from L1 through L5 in the lumbar spine and at T12, not significantly changed from previous exams. Kyphoplasty changes are noted from T12 through L4. No acute fracture is seen. Multiple lytic lesions are seen throughout the visualized thoracolumbar spine and sacrum. Paraspinal and other soft tissues: No acute abnormality. Disc levels: There is multilevel bulging of the disc annulus with facet arthropathy, most pronounced at L2-L3, L3-L4 and L4-L5 resulting in neural foraminal stenosis and mild-to-moderate spinal canal stenosis. IMPRESSION: 1. No acute fracture. 2. Scattered lytic lesions throughout the bones, compatible with history of multiple myeloma. 3. Stable compression deformities at T12-L5. 4. Multilevel degenerative changes. Electronically Signed   By: Leita Waddell HERO.D.  On: 01/30/2024 17:00   CT Renal Stone Study Result Date: 01/30/2024 CLINICAL DATA:  Intermittent sharp lower back pain EXAM: CT ABDOMEN AND PELVIS WITHOUT CONTRAST TECHNIQUE: Multidetector CT imaging of the abdomen and pelvis was performed following the standard protocol without IV contrast. RADIATION DOSE REDUCTION: This exam was performed according to the departmental dose-optimization program which includes automated exposure control, adjustment of the mA and/or kV according to patient size and/or use of iterative reconstruction technique. COMPARISON:  CT abdomen and pelvis dated 07/17/2022 FINDINGS: Lower chest: No focal consolidation or pulmonary nodule in the lung bases. No pleural effusion or pneumothorax demonstrated. Partially imaged heart size is normal. Hepatobiliary: Unchanged 1.6 cm segment 2 cyst (2:18). No intra or extrahepatic biliary ductal dilation. Normal gallbladder. Pancreas: No focal lesions or main ductal dilation. Spleen: Normal  in size without focal abnormality. Adrenals/Urinary Tract: No adrenal nodules. No suspicious renal mass on this noncontrast enhanced examination , calculi or hydronephrosis. No focal bladder wall thickening. Stomach/Bowel: Normal appearance of the stomach. No evidence of bowel wall thickening, distention, or inflammatory changes. Punctate radiodensity within a loop of left abdominal small bowel (2:48), likely ingested material. Colon is diffusely underdistended. Again seen is prominent appendix with focal dilation of the appendiceal tip measuring 16 mm (2:49). Vascular/Lymphatic: Aortic atherosclerosis. Again seen is linear hyperdensity extending into the IVC from the adjacent L2 vertebral body (2:42). No enlarged abdominal or pelvic lymph nodes. Reproductive: Prostate is unremarkable. Other: No free fluid, fluid collection, or free air. Musculoskeletal: Similar diffuse heterogeneity of the bone marrow, likely corresponding to multiple myeloma. Again seen is multilevel compression deformity of the thoracolumbar spine with prior vertebral augmentation of T10, T12-L4. IMPRESSION: 1. Prominent appendix with increased focal dilation of the appendiceal tip measuring 16 mm. Findings may reflect sequela of appendicitis or developing appendiceal mucocele. 2. Similar diffuse heterogeneity of the bone marrow, likely corresponding to multiple myeloma with unchanged compression fracture and multilevel vertebral augmentation. Similar migration of kyphoplasty cement into the IVC at the level of L2. 3.  Aortic Atherosclerosis (ICD10-I70.0). Electronically Signed   By: Limin  Xu M.D.   On: 01/30/2024 16:54     Procedures   Medications Ordered in the ED  fentaNYL  (SUBLIMAZE ) injection 100 mcg (has no administration in time range)  diazepam  (VALIUM ) injection 2.5 mg (has no administration in time range)  acetaminophen  (TYLENOL ) tablet 1,000 mg (has no administration in time range)  lidocaine  (LIDODERM ) 5 % 1 patch (has no  administration in time range)                                    Medical Decision Making Amount and/or Complexity of Data Reviewed Labs: ordered. Radiology: ordered.  Risk OTC drugs. Prescription drug management.   53 year old male presents today for concern of repeat episode of his sharp right-sided low back pain.  Occurred while he was bending over to turn on the TV.  He states it was a severe episode of pain.  He has had some improvement.  EMS felt Duda muscle spasm.  Will give him Valium .  Given he has history of cancer and he has planned MRI towards the end of this month of his thoracic and lumbar spine we will attempt to obtain these today.  He is in agreement. After the initial round of pain medicine and value he had significant improvement in pain.  He stated he felt great.  However immediately after  his pain medicines were given he went to MRI where he had to lie flat on the table and that reactivated his pain.  They were not able to obtain the MRI. Improved after additional pain medicine.  I ambulated patient in the hallway and he did great.  When I laid patient flat in the room on the stretcher his pain did recur.  Feels very muscular.  He was comfortable with discharge with short course of Valium  and follow-up outpatient.  Advised him not to take this and Robaxin  at the same time. Strict return precautions given. Patient voices understanding and is in agreement with plan.   Final diagnoses:  Muscle spasm of back    ED Discharge Orders          Ordered    diazepam  (VALIUM ) 2 MG tablet  Every 8 hours PRN        01/31/24 1652               Hildegard Loge, PA-C 01/31/24 1731    Patsey Lot, MD 02/03/24 [phone]

## 2024-02-01 ENCOUNTER — Other Ambulatory Visit (HOSPITAL_COMMUNITY): Payer: Self-pay

## 2024-02-01 ENCOUNTER — Telehealth: Payer: Self-pay | Admitting: *Deleted

## 2024-02-01 LAB — MULTIPLE MYELOMA PANEL, SERUM
Albumin SerPl Elph-Mcnc: 3.7 g/dL (ref 2.9–4.4)
Albumin/Glob SerPl: 1.3 (ref 0.7–1.7)
Alpha 1: 0.3 g/dL (ref 0.0–0.4)
Alpha2 Glob SerPl Elph-Mcnc: 1.1 g/dL — ABNORMAL HIGH (ref 0.4–1.0)
B-Globulin SerPl Elph-Mcnc: 1.2 g/dL (ref 0.7–1.3)
Gamma Glob SerPl Elph-Mcnc: 0.5 g/dL (ref 0.4–1.8)
Globulin, Total: 3 g/dL (ref 2.2–3.9)
IgA: 23 mg/dL — ABNORMAL LOW (ref 90–386)
IgG (Immunoglobin G), Serum: 561 mg/dL — ABNORMAL LOW (ref 603–1613)
IgM (Immunoglobulin M), Srm: 16 mg/dL — ABNORMAL LOW (ref 20–172)
Total Protein ELP: 6.7 g/dL (ref 6.0–8.5)

## 2024-02-01 NOTE — Telephone Encounter (Signed)
 Received Call from pt. He states he was seen in the ED over the weekend due to increased back pain. He had a CT  of his spine and noted to have several bulging discs in lumbar vertebrae. He does have an appt with neurosurgery on Wednesday of this week.  He has not received his Revlimid  yet. Provided phone # for him to call to arrange delivery. Advised that it was ordered on 01/27/24 and his specialty pharmacy is [facility]. Pt voiced understanding. Advised I would find out about his next appts here to satr=art his maintenance dara injections.

## 2024-02-02 NOTE — Progress Notes (Signed)
 Patient tolerated injection well. Patient alert, oriented, and ambulatory

## 2024-02-03 ENCOUNTER — Ambulatory Visit: Payer: Self-pay | Admitting: Surgery

## 2024-02-03 ENCOUNTER — Other Ambulatory Visit (HOSPITAL_COMMUNITY): Payer: Self-pay

## 2024-02-03 ENCOUNTER — Encounter (HOSPITAL_COMMUNITY): Payer: Self-pay | Admitting: Surgery

## 2024-02-03 NOTE — Progress Notes (Signed)
 For Anesthesia: PCP - Raguel Blush, MD last office visit note 08/25/23 in [facility] Cardiologist - Dorn Lesches, MD   Bowel Prep reminder:  Chest x-ray - greater than 1 year EKG - greater than 1 year Stress Test - 08/08/16 in [facility] ECHO - 05/07/23 [facility]iac Cath -  Pacemaker/ICD device last checked: Pacemaker orders received: Device Rep notified:  Spinal Cord Stimulator:  Sleep Study - 09/25/21 in [facility] CPAP -   Fasting Blood Sugar -  Checks Blood Sugar _____ times a day Date and result of last Hgb A1c-  Last dose of GLP1 agonist-  GLP1 instructions: Hold 7 days prior to schedule (Hold 24 hours-daily)   Last dose of SGLT-2 inhibitors-  SGLT-2 instructions: Hold 72 hours prior to surgery  Blood Thinner Instructions: Aspirin  Instructions: Last Dose:  Activity level: Can go up a flight of stairs and activities of daily living without stopping and without chest pain and/or shortness of breath   Able to exercise without chest pain and/or shortness of breath   Unable to go up a flight of stairs without chest pain and/or shortness of breath     Anesthesia review:   Patient denies shortness of breath, fever, cough and chest pain at PAT appointment   Patient verbalized understanding of instructions that were reviewed over the telephone.

## 2024-02-04 ENCOUNTER — Encounter (HOSPITAL_COMMUNITY): Payer: Self-pay | Admitting: Surgery

## 2024-02-04 ENCOUNTER — Other Ambulatory Visit: Payer: Self-pay

## 2024-02-05 ENCOUNTER — Encounter (HOSPITAL_COMMUNITY): Payer: Self-pay | Admitting: Surgery

## 2024-02-05 ENCOUNTER — Other Ambulatory Visit: Payer: Self-pay

## 2024-02-05 ENCOUNTER — Telehealth: Payer: Self-pay

## 2024-02-05 DIAGNOSIS — D490 Neoplasm of unspecified behavior of digestive system: Secondary | ICD-10-CM | POA: Diagnosis present

## 2024-02-05 NOTE — H&P (Signed)
 REFERRING PHYSICIAN: Room, Emergency  PROVIDER: Monty Mccarrell OZELL SPINNER, MD   Chief Complaint: New Consultation (Enlarged appendix)  History of Present Illness:  Patient is self-referred. Patient had been seen in the emergency department for complaints of back pain. Patient underwent a CT scan of the abdomen and pelvis on January 30, 2024. 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 Review of prior studies dating back to 2023 show progressive enlargement of the appendix. Patient now presents for consideration for appendectomy for definitive diagnosis and management. Patient has had no prior abdominal surgery. 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 Patient is also having issues with his lumbar spine. He has had kyphoplasty in the past.  Review of Systems: A complete review of systems was obtained from the patient. I have reviewed this information and discussed as appropriate with the patient. See HPI as well for other ROS.  Review of Systems  Constitutional: Negative.  HENT: Negative.  Eyes: Negative.  Respiratory: Negative.  Cardiovascular: Negative.  Gastrointestinal: Negative.  Genitourinary: Negative.  Musculoskeletal: Positive for back pain.  Skin: Negative.  Neurological: Negative.  Endo/Heme/Allergies: Negative.  Psychiatric/Behavioral: Negative.    Medical History: Past Medical History:  Diagnosis Date  Anemia  GERD (gastroesophageal reflux disease)  Hepatic cyst  Hiatal hernia  History of cancer  History of colonic polyps  Hyperlipidemia  Hypertension  Rectal bleeding  Tobacco abuse   Patient Active Problem List  Diagnosis  Appendiceal tumor   Past Surgical History:  Procedure Laterality Date  Ir bone tumor(s)rf ablation  COLONOSCOPY  ESOPHAGOGASTRODUODENOSCOPY, FLEXIBLE, TRANSORAL FOR BARIATRIC BALLOON ADJUSTMENT  Ir bone tumor(s)rf  ablation  Ir kypho ea addl level thoracic or lumbar  Ir kypho lumbar inc fx reduce bone bx uni/bil cannulation inc/imaging    No Known Allergies  Current Outpatient Medications on File Prior to Visit  Medication Sig Dispense Refill  acyclovir  (ZOVIRAX ) 400 MG tablet Take 400 mg by mouth 2 (two) times daily  allopurinoL  (ZYLOPRIM ) 300 MG tablet Take 300 mg by mouth once daily  diazePAM  (VALIUM ) 2 MG tablet Take 2 mg by mouth  docusate (COLACE) 100 MG capsule Take 100 mg by mouth  DULoxetine  (CYMBALTA ) 20 MG DR capsule Take 20 mg by mouth once daily  gabapentin  (NEURONTIN ) 100 MG capsule Take 100 mg by mouth 2 (two) times daily  hydroCHLOROthiazide  (HYDRODIURIL ) 25 MG tablet Take 25 mg by mouth once daily  lenalidomide  (REVLIMID ) 10 mg capsule Take 10 mg by mouth  lidocaine  (LIDODERM ) 5 % patch Place 1 patch onto the skin once daily REMOVE & DISCARD PATCH WITHIN 12 HOURS OR AS DIRECTED BY MD  lisinopriL  (ZESTRIL ) 40 MG tablet Take 40 mg by mouth once daily  metoprolol  TARTrate (LOPRESSOR ) 50 MG tablet 50 mg by oral route.  ondansetron  (ZOFRAN ) 8 MG tablet Take 8 mg by mouth every 8 (eight) hours as needed for Nausea or Vomiting  oxyCODONE  (ROXICODONE ) 5 MG immediate release tablet Take 5 mg by mouth  pantoprazole  (PROTONIX ) 40 MG DR tablet Take 40 mg by mouth 2 (two) times daily  polyethylene glycol (MIRALAX ) powder Take 17 g by mouth  rosuvastatin  (CRESTOR ) 10 MG tablet Take 10 mg by mouth once daily  sildenafiL  (VIAGRA ) 50 MG tablet Take 50 mg by mouth once daily as needed for Erectile Dysfunction  cetirizine  (ZYRTEC ) 10 MG tablet Take 10 mg by mouth once daily as  needed  multivitamin tablet Take 1 tablet by mouth once daily   No current facility-administered medications on file prior to visit.   Family History  Problem Relation Age of Onset  Heart disease Mother  High blood pressure (Hypertension) Mother  Diabetes Father  High blood pressure (Hypertension) Father  Colon cancer  Sister  Myocardial Infarction (Heart attack) Maternal Uncle  Prostate cancer Maternal Uncle  Pancreatic cancer Neg Hx  Rectal cancer Neg Hx  Esophageal cancer Neg Hx  Stomach cancer Neg Hx    Social History   Tobacco Use  Smoking Status Not on file  Smokeless Tobacco Not on file    Social History   Socioeconomic History  Marital status: Single   Social Drivers of Health   Financial Resource Strain: Low Risk (02/13/2023)  Received from [facility]  Overall Financial Resource Strain (CARDIA)  Difficulty of Paying Living Expenses: Not very hard  Food Insecurity: Low Risk (08/09/2023)  Received from [facility]  Hunger Vital Sign  Within the past 12 months, you worried that your food would run out before you got money to buy more: Never true  Within the past 12 months, the food you bought just didn't last and you didn't have money to get more. : Never true  Transportation Needs: No Transportation Needs (08/09/2023)  Received from [employer]  In the past 12 months, has lack of reliable transportation kept you from medical appointments, meetings, work or from getting things needed for daily living? : No  Physical Activity: Inactive (02/13/2023)  Received from [facility]  Exercise Vital Sign  On average, how many days per week do you engage in moderate to strenuous exercise (like a brisk walk)?: 0 days  On average, how many minutes do you engage in exercise at this level?: 0 min  Stress: No Stress Concern Present (02/13/2023)  Received from [facility] - Occupational Stress Questionnaire  Feeling of Stress : Not at all  Social Connections: Unknown (02/13/2023)  Received from [facility]  Social Connection and Isolation Panel  In a typical week, how many times do you talk on the phone with family, friends, or neighbors?: More than three times a week  How often do you get together with friends or relatives?: More than  three times a week  How often do you attend church or religious services?: More than 4 times per year  Do you belong to any clubs or organizations such as church groups, unions, fraternal or athletic groups, or school groups?: No  How often do you attend meetings of the clubs or organizations you belong to?: Never  Housing Stability: Low Risk (08/09/2023)  Received from [employer] Stability Vital Sign  What is your living situation today?: I have a steady place to live  Think about the place you live. Do you have problems with any of the following? Choose all that apply:: None/None on this list     Physical Exam   GENERAL APPEARANCE Comfortable, no acute issues Development: normal Gross deformities: none  SKIN Rash, lesions, ulcers: none Induration, erythema: none Nodules: none palpable  EYES Conjunctiva and lids: normal Pupils: equal  EARS, NOSE, MOUTH, THROAT External ears: no lesion or deformity External nose: no lesion or deformity Hearing: grossly normal  NECK Symmetric: yes Trachea: midline Thyroid : no palpable nodules in the thyroid  bed  CHEST/CV Not assessed  ABDOMEN Abdomen is soft without distention. No surgical incisions. No obvious hernias. No  palpable masses. No significant tenderness.  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 Today we reviewed his previous CT scans dating back to 2023. His most recent CT scan from September 2025 shows dilatation of  the tip of the appendix up to 16 mm. 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  Today we discussed proceeding with laparoscopic appendectomy. We discussed the procedure. We discussed the size and location of the surgical incisions. We discussed doing this as an outpatient surgical procedure. We discussed the postoperative recovery. Patient understands and wishes to proceed with surgery in the near future.   Krystal Spinner, MD [facility] practice Office: [phone]

## 2024-02-05 NOTE — Telephone Encounter (Signed)
 Pt called with questions regarding his medications.  Pt wanted to know which medications is he supposed to be taking and which medications is not supposed to be taking.  Asked is there any medications specifically he's inquiring about.  Pt asked if he's still supposed to be taking the Allopurinol  and Gabapentin .  The allopurinol  is prescribed by Johnston Police, PA-C and the gabapentin  is prescribed by Fannie Levon Borer, NP in Palliative Care.  Pt stated he's having an Appendix surgery scheduled on 02/09/2024.  Pt has several question regarding basically all his medications which I deferred to the prescribing provider.  Stated this nurse will make Dr. Federico and his team aware of the pt's medication questions.  Stated someone from Dr. Lafonda Team will follow-up with him on his medications.  Pt verbalized understanding and had no further questions or concerns.

## 2024-02-08 ENCOUNTER — Other Ambulatory Visit: Payer: Self-pay | Admitting: *Deleted

## 2024-02-08 ENCOUNTER — Encounter (HOSPITAL_COMMUNITY): Payer: Self-pay | Admitting: Surgery

## 2024-02-08 ENCOUNTER — Telehealth: Payer: Self-pay | Admitting: *Deleted

## 2024-02-08 NOTE — Telephone Encounter (Signed)
 TCT patient regarding his medications. He called on 02/05/24 to review his medication list so he knows what he needs to take and what he does not need to take. He is to continue the Acyclovir . He is to stop the Allopurinol . We are holding his Aspirin  and Revlimid  as he is having an appendectomy tomorrow. We will hold his ASA, Revlimid  and not start his Dara until late next week depending on how his surgery goes.  His antifungals and antibiotics are  stopped as well as he is well past his 100 day mark post stem cell transplant. He is to ask his PCP about his Crestor . Pt voiced understanding. Advised that I would call him later in the week s/p his surgery.  Pt voiced understanding.

## 2024-02-08 NOTE — Progress Notes (Signed)
 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 Boas Eletha.   Per previous anesthesia note 02/22/2022, Difficulty Due To: Difficulty was anticipated and Difficult Airway- due to large tongue Future Recommendations: Recommend- induction with short-acting agent, and alternative techniques readily available Comments: Large tongue, short neck and thick chest- glidescope recommended  Low risk stress test 2018.   Echo 05/07/2023 (Care Everywhere) SUMMARY  The left ventricular size is normal.  Left ventricular systolic function is normal.  LV ejection fraction = 60-65%.  Left ventricular filling pattern is prolonged relaxation.  The right ventricle is normal in size and function.  There was insufficient TR detected to calculate RV systolic pressure.  IVC size was normal.  There is no significant valvular stenosis or regurgitation.  There is no pericardial effusion.  -  VS: Ht 5' 9 (1.753 m)   Wt 109.8 kg   BMI 35.74 kg/m   PROVIDERS: Tanda Bleacher, MD is PCP    LABS: Labs reviewed: Acceptable for surgery. (all labs ordered are listed, but only abnormal results are displayed)  Labs Reviewed - No data to display   IMAGES:   EKG:   CV: Echo 08/08/2016 <1 mm ST depressions with T wave inversions at rest and throughout study in leads III and aVF. Defect 1: There is a medium defect of moderate severity present in the basal inferoseptal, basal inferior, basal inferolateral, mid inferoseptal, mid  inferior and mid inferolateral location. While this is likely due to soft tissue attenuation artifact, a degree of myocardial scar cannot enitrely be ruled out. This is a low risk study. No ischemic territories. Nuclear stress EF: 61%. Past Medical History:  Diagnosis Date   Allergy    Anemia    Anxiety    Atypical chest pain    Cancer (HCC)    Multiple myeloma with normocytic anemia   Depression    GERD (gastroesophageal reflux disease)    H/O transfusion of platelets    Hepatic cyst    Benign by MRI   Hiatal hernia    History of autologous stem cell transplant (HCC) 07/2023   History of colonic polyps    Hyperlipidemia    Hypertension    Multiple myeloma (HCC)    Pre-diabetes    Rectal bleeding    Restless leg    Tobacco abuse     Past Surgical History:  Procedure Laterality Date   COLONOSCOPY  01/10/2009     RMR: Normal rectum, normal colon; repeat in 2015 due to FH of colon cancer   COLONOSCOPY N/A 01/09/2014   MFM:Floupeoz colonic polyps-removed as described   COLONOSCOPY N/A 09/18/2016   Procedure: COLONOSCOPY;  Surgeon: Lamar CHRISTELLA Hollingshead, MD;  [facility];  Service: Endoscopy;  Laterality: N/A;  730    ESOPHAGOGASTRODUODENOSCOPY  10/04/2007   RMR: Distal esophageal erosions consistent with erosive reflux esophagitis, patulous gastroesophageal junction status post passage of a  Maloney dilator, 56 [place].  Otherwise, unremarkable esophagus.  Hiatal hernia.  Otherwise normal stomach.  Bulbar erosion   ESOPHAGOGASTRODUODENOSCOPY N/A 01/09/2014  Erosive reflux esophagitis. Small hiatal hernia   HUMERUS IM NAIL Left 02/22/2022   Procedure: INTRAMEDULLARY (IM) NAIL HUMERAL;  Surgeon: Sharl Selinda Dover, MD;  Location: [facility] OR;  Service: Orthopedics;  Laterality: Left;   I & D EXTREMITY Left 07/26/2018   Procedure: IRRIGATION AND DEBRIDEMENT EXTREMITY;  Surgeon: Lorretta Dess, MD;  Location: [facility] OR;  Service: Plastics;  Laterality: Left;   IR BONE TUMOR(S)RF ABLATION   02/05/2022   IR BONE TUMOR(S)RF ABLATION  02/24/2022   IR KYPHO EA ADDL LEVEL THORACIC OR LUMBAR  02/19/2022   IR KYPHO LUMBAR INC FX REDUCE BONE BX UNI/BIL CANNULATION INC/IMAGING  02/05/2022   IR KYPHO LUMBAR INC FX REDUCE BONE BX UNI/BIL CANNULATION INC/IMAGING  02/19/2022   PERCUTANEOUS PINNING Left 07/26/2018   Procedure: PERCUTANEOUS PINNING EXTREMITY;  Surgeon: Lorretta Dess, MD;  Location: [facility] OR;  Service: Plastics;  Laterality: Left;    MEDICATIONS: No current facility-administered medications for this encounter.    acetaminophen  (TYLENOL ) 500 MG tablet   acyclovir  (ZOVIRAX ) 400 MG tablet   aspirin  EC 81 MG tablet   cetirizine  (ZYRTEC ) 10 MG tablet   diazepam  (VALIUM ) 2 MG tablet   docusate sodium  (COLACE) 100 MG capsule   DULoxetine  (CYMBALTA ) 20 MG capsule   gabapentin  (NEURONTIN ) 100 MG capsule   lenalidomide  (REVLIMID ) 10 MG capsule   Multiple Vitamin (MULTIVITAMIN) tablet   ondansetron  (ZOFRAN ) 8 MG tablet   oxyCODONE  (ROXICODONE ) 5 MG immediate release tablet   pantoprazole  (PROTONIX ) 40 MG tablet   polyethylene glycol powder (GLYCOLAX /MIRALAX ) 17 GM/SCOOP powder   sildenafil  (VIAGRA ) 50 MG tablet   allopurinol  (ZYLOPRIM ) 300 MG tablet   lidocaine  (LIDODERM ) 5 %   lisinopril  (ZESTRIL ) 40 MG tablet   rosuvastatin  (CRESTOR ) 10 MG tablet    [facility], PA-C [facility] Pre-Surgical Testing [phone]

## 2024-02-08 NOTE — Anesthesia Preprocedure Evaluation (Addendum)
 Anesthesia Evaluation  Patient identified by MRN, date of birth, ID band Patient awake    Reviewed: Allergy & Precautions, NPO status , Patient's Chart, lab work & pertinent test results, reviewed documented beta blocker date and time   History of Anesthesia Complications Negative for: history of anesthetic complications  Airway Mallampati: II  TM Distance: >3 FB Neck ROM: Full    Dental  (+) Dental Advisory Given   Pulmonary former smoker   Pulmonary exam normal breath sounds clear to auscultation       Cardiovascular hypertension (no longer on BP meds x6 months), (-) angina Normal cardiovascular exam Rhythm:Regular Rate:Normal     Neuro/Psych  PSYCHIATRIC DISORDERS Anxiety Depression    negative neurological ROS     GI/Hepatic hiatal hernia,GERD  Medicated and Controlled,,(+)     substance abuse  alcohol useNeoplasm of appendix   Endo/Other  diabetes (glu 127), Well Controlled, Type 2  Obesity: BMI 36 HLD Gout  Renal/GU Renal diseasenegative Renal ROS  negative genitourinary   Musculoskeletal Multiple myeloma Chronic back pain   Abdominal  (+) + obese  Peds  Hematology  (+) Blood dyscrasia, anemia Hx/o thrombocytopenia Multiple myeloma: stem cell transplant Hb 13.8, plt 195k   Anesthesia Other Findings   Reproductive/Obstetrics ED                              Anesthesia Physical Anesthesia Plan  ASA: 3  Anesthesia Plan: General   Post-op Pain Management: Dilaudid  IV, Precedex and Ofirmev  IV (intra-op)*   Induction: Intravenous and Cricoid pressure planned  PONV Risk Score and Plan: 4 or greater and Treatment may vary due to age or medical condition, Midazolam , Ondansetron  and Dexamethasone   Airway Management Planned: Oral ETT and Video Laryngoscope Planned  Additional Equipment: None  Intra-op Plan:   Post-operative Plan: Extubation in OR  Informed Consent: I have  reviewed the patients History and Physical, chart, labs and discussed the procedure including the risks, benefits and alternatives for the proposed anesthesia with the patient or authorized representative who has indicated his/her understanding and acceptance.     Dental advisory given  Plan Discussed with: CRNA, Anesthesiologist and Surgeon  Anesthesia Plan Comments:          Anesthesia Quick Evaluation

## 2024-02-09 ENCOUNTER — Ambulatory Visit (HOSPITAL_COMMUNITY)
Admission: RE | Admit: 2024-02-09 | Discharge: 2024-02-09 | Disposition: A | Discharge status: Home / Self Care | Attending: Surgery | Admitting: Surgery

## 2024-02-09 ENCOUNTER — Encounter (HOSPITAL_COMMUNITY): Payer: Self-pay | Admitting: Surgery

## 2024-02-09 ENCOUNTER — Other Ambulatory Visit: Payer: Self-pay

## 2024-02-09 ENCOUNTER — Encounter (HOSPITAL_COMMUNITY)
Admission: RE | Disposition: A | Discharge status: Home / Self Care | Payer: Self-pay | Source: Home / Self Care | Attending: Surgery

## 2024-02-09 ENCOUNTER — Ambulatory Visit (HOSPITAL_BASED_OUTPATIENT_CLINIC_OR_DEPARTMENT_OTHER): Payer: Self-pay | Admitting: Physician Assistant

## 2024-02-09 ENCOUNTER — Ambulatory Visit (HOSPITAL_COMMUNITY): Payer: Self-pay | Admitting: Physician Assistant

## 2024-02-09 DIAGNOSIS — F418 Other specified anxiety disorders: Secondary | ICD-10-CM

## 2024-02-09 DIAGNOSIS — E669 Obesity, unspecified: Secondary | ICD-10-CM | POA: Diagnosis not present

## 2024-02-09 DIAGNOSIS — E785 Hyperlipidemia, unspecified: Secondary | ICD-10-CM | POA: Diagnosis not present

## 2024-02-09 DIAGNOSIS — D490 Neoplasm of unspecified behavior of digestive system: Secondary | ICD-10-CM

## 2024-02-09 DIAGNOSIS — D121 Benign neoplasm of appendix: Secondary | ICD-10-CM | POA: Diagnosis not present

## 2024-02-09 DIAGNOSIS — C9000 Multiple myeloma not having achieved remission: Secondary | ICD-10-CM | POA: Diagnosis not present

## 2024-02-09 DIAGNOSIS — Z6836 Body mass index (BMI) 36.0-36.9, adult: Secondary | ICD-10-CM | POA: Insufficient documentation

## 2024-02-09 DIAGNOSIS — E119 Type 2 diabetes mellitus without complications: Secondary | ICD-10-CM | POA: Diagnosis not present

## 2024-02-09 DIAGNOSIS — Z9484 Stem cells transplant status: Secondary | ICD-10-CM | POA: Insufficient documentation

## 2024-02-09 DIAGNOSIS — Z87891 Personal history of nicotine dependence: Secondary | ICD-10-CM | POA: Insufficient documentation

## 2024-02-09 DIAGNOSIS — K219 Gastro-esophageal reflux disease without esophagitis: Secondary | ICD-10-CM | POA: Diagnosis not present

## 2024-02-09 DIAGNOSIS — I10 Essential (primary) hypertension: Secondary | ICD-10-CM

## 2024-02-09 DIAGNOSIS — D373 Neoplasm of uncertain behavior of appendix: Secondary | ICD-10-CM | POA: Diagnosis present

## 2024-02-09 HISTORY — DX: Restless legs syndrome: G25.81

## 2024-02-09 HISTORY — DX: Personal history of other medical treatment: Z92.89

## 2024-02-09 HISTORY — DX: Prediabetes: R73.03

## 2024-02-09 HISTORY — DX: Multiple myeloma not having achieved remission: C90.00

## 2024-02-09 HISTORY — DX: Anxiety disorder, unspecified: F41.9

## 2024-02-09 HISTORY — DX: Depression, unspecified: F32.A

## 2024-02-09 HISTORY — PX: LAPAROSCOPIC APPENDECTOMY: SHX408

## 2024-02-09 HISTORY — DX: Sleep apnea, unspecified: G47.30

## 2024-02-09 SURGERY — APPENDECTOMY, LAPAROSCOPIC
Anesthesia: General | Site: Abdomen

## 2024-02-09 MED ORDER — BUPIVACAINE-EPINEPHRINE 0.25% -1:200000 IJ SOLN
INTRAMUSCULAR | Status: DC | PRN
  Administered 2024-02-09: 30 mL

## 2024-02-09 MED ORDER — DROPERIDOL 2.5 MG/ML IJ SOLN: 0.6250 mg | Freq: Once | INTRAMUSCULAR | Status: DC | PRN

## 2024-02-09 MED ORDER — KETAMINE HCL 50 MG/5ML IJ SOSY
PREFILLED_SYRINGE | INTRAMUSCULAR | Status: AC
  Filled 2024-02-09: qty 5

## 2024-02-09 MED ORDER — FENTANYL CITRATE (PF) 100 MCG/2ML IJ SOLN
INTRAMUSCULAR | Status: AC
  Filled 2024-02-09: qty 2

## 2024-02-09 MED ORDER — HYDROMORPHONE HCL 1 MG/ML IJ SOLN
0.2500 mg | INTRAMUSCULAR | Status: DC | PRN
  Administered 2024-02-09: 0.5 mg via INTRAVENOUS

## 2024-02-09 MED ORDER — 0.9 % SODIUM CHLORIDE (POUR BTL) OPTIME
TOPICAL | Status: DC | PRN
Start: 2024-02-09 — End: 2024-02-09
  Administered 2024-02-09: 1000 mL

## 2024-02-09 MED ORDER — ORAL CARE MOUTH RINSE: 15.0000 mL | Freq: Once | OROMUCOSAL | Status: DC

## 2024-02-09 MED ORDER — CHLORHEXIDINE GLUCONATE CLOTH 2 % EX PADS: 6.0000 | MEDICATED_PAD | Freq: Once | CUTANEOUS | Status: DC

## 2024-02-09 MED ORDER — LIDOCAINE HCL (CARDIAC) PF 100 MG/5ML IV SOSY
PREFILLED_SYRINGE | INTRAVENOUS | Status: DC | PRN
  Administered 2024-02-09: 80 mg via INTRAVENOUS

## 2024-02-09 MED ORDER — SODIUM CHLORIDE 0.9 % IV SOLN
1.0000 g | INTRAVENOUS | Status: AC
  Administered 2024-02-09: 1 g via INTRAVENOUS
  Filled 2024-02-09: qty 1000

## 2024-02-09 MED ORDER — STERILE WATER FOR IRRIGATION IR SOLN
Status: DC | PRN
Start: 2024-02-09 — End: 2024-02-09
  Administered 2024-02-09: 1000 mL

## 2024-02-09 MED ORDER — KETAMINE HCL 50 MG/5ML IJ SOSY
PREFILLED_SYRINGE | INTRAMUSCULAR | Status: DC | PRN
  Administered 2024-02-09: 20 mg via INTRAVENOUS
  Administered 2024-02-09: 15 mg via INTRAVENOUS

## 2024-02-09 MED ORDER — MIDAZOLAM HCL 2 MG/2ML IJ SOLN
INTRAMUSCULAR | Status: DC | PRN
  Administered 2024-02-09: 2 mg via INTRAVENOUS

## 2024-02-09 MED ORDER — OXYCODONE HCL 5 MG PO TABS
ORAL_TABLET | ORAL | Status: AC
  Filled 2024-02-09: qty 1

## 2024-02-09 MED ORDER — OXYCODONE HCL 5 MG/5ML PO SOLN: 5.0000 mg | Freq: Once | ORAL | Status: AC | PRN

## 2024-02-09 MED ORDER — SUGAMMADEX SODIUM 200 MG/2ML IV SOLN
INTRAVENOUS | Status: DC | PRN
  Administered 2024-02-09: 220 mg via INTRAVENOUS

## 2024-02-09 MED ORDER — PROPOFOL 10 MG/ML IV BOLUS
INTRAVENOUS | Status: AC
  Filled 2024-02-09: qty 20

## 2024-02-09 MED ORDER — SUCCINYLCHOLINE CHLORIDE 200 MG/10ML IV SOSY
PREFILLED_SYRINGE | INTRAVENOUS | Status: DC | PRN
  Administered 2024-02-09: 140 mg via INTRAVENOUS

## 2024-02-09 MED ORDER — CHLORHEXIDINE GLUCONATE 0.12 % MT SOLN: 15.0000 mL | Freq: Once | OROMUCOSAL | Status: DC

## 2024-02-09 MED ORDER — SUGAMMADEX SODIUM 200 MG/2ML IV SOLN
INTRAVENOUS | Status: AC
  Filled 2024-02-09: qty 2

## 2024-02-09 MED ORDER — ONDANSETRON HCL 4 MG/2ML IJ SOLN: 4.0000 mg | Freq: Once | INTRAMUSCULAR | Status: DC | PRN

## 2024-02-09 MED ORDER — LACTATED RINGERS IV SOLN: INTRAVENOUS | Status: DC

## 2024-02-09 MED ORDER — DEXAMETHASONE SODIUM PHOSPHATE 10 MG/ML IJ SOLN
INTRAMUSCULAR | Status: DC | PRN
  Administered 2024-02-09: 4 mg via INTRAVENOUS

## 2024-02-09 MED ORDER — LABETALOL HCL 5 MG/ML IV SOLN
5.0000 mg | INTRAVENOUS | Status: AC | PRN
  Administered 2024-02-09 (×2): 5 mg via INTRAVENOUS

## 2024-02-09 MED ORDER — HYDROMORPHONE HCL 1 MG/ML IJ SOLN
INTRAMUSCULAR | Status: AC
  Filled 2024-02-09: qty 2

## 2024-02-09 MED ORDER — PROPOFOL 10 MG/ML IV BOLUS
INTRAVENOUS | Status: DC | PRN
  Administered 2024-02-09: 200 mg via INTRAVENOUS

## 2024-02-09 MED ORDER — DEXAMETHASONE SODIUM PHOSPHATE 10 MG/ML IJ SOLN
INTRAMUSCULAR | Status: AC
  Filled 2024-02-09: qty 1

## 2024-02-09 MED ORDER — MIDAZOLAM HCL 2 MG/2ML IJ SOLN
INTRAMUSCULAR | Status: AC
  Filled 2024-02-09: qty 2

## 2024-02-09 MED ORDER — ONDANSETRON HCL 4 MG/2ML IJ SOLN
INTRAMUSCULAR | Status: DC | PRN
  Administered 2024-02-09: 4 mg via INTRAVENOUS

## 2024-02-09 MED ORDER — ROCURONIUM BROMIDE 10 MG/ML (PF) SYRINGE
PREFILLED_SYRINGE | INTRAVENOUS | Status: DC | PRN
  Administered 2024-02-09: 10 mg via INTRAVENOUS
  Administered 2024-02-09: 40 mg via INTRAVENOUS

## 2024-02-09 MED ORDER — PHENYLEPHRINE 80 MCG/ML (10ML) SYRINGE FOR IV PUSH (FOR BLOOD PRESSURE SUPPORT)
PREFILLED_SYRINGE | INTRAVENOUS | Status: DC | PRN
  Administered 2024-02-09: 80 ug via INTRAVENOUS

## 2024-02-09 MED ORDER — ROCURONIUM BROMIDE 10 MG/ML (PF) SYRINGE
PREFILLED_SYRINGE | INTRAVENOUS | Status: AC
  Filled 2024-02-09: qty 10

## 2024-02-09 MED ORDER — OXYCODONE HCL 5 MG PO TABS
5.0000 mg | ORAL_TABLET | Freq: Once | ORAL | Status: AC | PRN
  Administered 2024-02-09: 5 mg via ORAL

## 2024-02-09 MED ORDER — BUPIVACAINE-EPINEPHRINE (PF) 0.25% -1:200000 IJ SOLN
INTRAMUSCULAR | Status: AC
  Filled 2024-02-09: qty 30

## 2024-02-09 MED ORDER — ONDANSETRON HCL 4 MG/2ML IJ SOLN
INTRAMUSCULAR | Status: AC
  Filled 2024-02-09: qty 2

## 2024-02-09 MED ORDER — FENTANYL CITRATE (PF) 100 MCG/2ML IJ SOLN
INTRAMUSCULAR | Status: DC | PRN
  Administered 2024-02-09 (×2): 50 ug via INTRAVENOUS
  Administered 2024-02-09: 100 ug via INTRAVENOUS

## 2024-02-09 MED ORDER — OXYCODONE HCL 5 MG PO TABS: 5.0000 mg | ORAL_TABLET | Freq: Four times a day (QID) | ORAL | 0 refills | Status: DC | PRN

## 2024-02-09 MED ORDER — LABETALOL HCL 5 MG/ML IV SOLN
INTRAVENOUS | Status: AC
  Filled 2024-02-09: qty 4

## 2024-02-09 MED ORDER — LACTATED RINGERS IV SOLN
INTRAVENOUS | Status: AC | PRN
  Administered 2024-02-09: 1000 mL

## 2024-02-09 SURGICAL SUPPLY — 25 items
BAG COUNTER SPONGE SURGICOUNT (BAG) IMPLANT
CHLORAPREP W/TINT 26 (MISCELLANEOUS) ×1 IMPLANT
COVER SURGICAL LIGHT HANDLE (MISCELLANEOUS) ×1 IMPLANT
CUTTER FLEX LINEAR 45M (STAPLE) IMPLANT
DERMABOND ADVANCED .7 DNX12 (GAUZE/BANDAGES/DRESSINGS) ×1 IMPLANT
ELECT PENCIL ROCKER SW 15FT (MISCELLANEOUS) IMPLANT
ELECT REM PT RETURN 15FT ADLT (MISCELLANEOUS) ×1 IMPLANT
GLOVE SURG ORTHO 8.0 STRL STRW (GLOVE) ×1 IMPLANT
GOWN STRL REUS W/ TWL XL LVL3 (GOWN DISPOSABLE) ×2 IMPLANT
IRRIGATION SUCT STRKRFLW 2 WTP (MISCELLANEOUS) ×1 IMPLANT
KIT BASIN OR (CUSTOM PROCEDURE TRAY) ×1 IMPLANT
KIT TURNOVER KIT A (KITS) ×1 IMPLANT
RELOAD STAPLE 45 2.5 WHT GRN (ENDOMECHANICALS) IMPLANT
RELOAD STAPLE 45 3.5 BLU ETS (ENDOMECHANICALS) IMPLANT
SET TUBE SMOKE EVAC HIGH FLOW (TUBING) ×1 IMPLANT
SHEARS HARMONIC 36 ACE (MISCELLANEOUS) ×1 IMPLANT
SPIKE FLUID TRANSFER (MISCELLANEOUS) ×1 IMPLANT
SUT MNCRL AB 4-0 PS2 18 (SUTURE) ×1 IMPLANT
SUT VICRYL 0 UR6 27IN ABS (SUTURE) ×1 IMPLANT
SYSTEM BAG RETRIEVAL 10MM (BASKET) ×1 IMPLANT
TOWEL OR 17X26 10 PK STRL BLUE (TOWEL DISPOSABLE) ×1 IMPLANT
TRAY LAPAROSCOPIC (CUSTOM PROCEDURE TRAY) ×1 IMPLANT
TROCAR 11X100 Z THREAD (TROCAR) ×1 IMPLANT
TROCAR BALLN 12MMX100 BLUNT (TROCAR) ×1 IMPLANT
TROCAR Z-THREAD OPTICAL 5X100M (TROCAR) ×1 IMPLANT

## 2024-02-09 NOTE — Anesthesia Postprocedure Evaluation (Signed)
 Anesthesia Post Note  Patient: [name]  Procedure(s) Performed: APPENDECTOMY, LAPAROSCOPIC (Abdomen)     Patient location during evaluation: PACU Anesthesia Type: General Level of consciousness: awake and alert and oriented Pain management: pain level controlled Vital Signs Assessment: post-procedure vital signs reviewed and stable Respiratory status: spontaneous breathing, nonlabored ventilation and respiratory function stable Cardiovascular status: blood pressure returned to baseline and stable Postop Assessment: no apparent nausea or vomiting Anesthetic complications: no   No notable events documented.  Last Vitals:  Vitals:   02/09/24 0914 02/09/24 0915  BP:  (!) 151/95  Pulse: 85 81  Resp: 15 (!) 21  Temp:    SpO2: 96% 99%    Last Pain:  Vitals:   02/09/24 0900  TempSrc:   PainSc: 3                  Chelcy Bolda A.

## 2024-02-09 NOTE — Interval H&P Note (Signed)
 History and Physical Interval Note:  02/09/2024 7:03 AM  [name]  has presented today for surgery, with the diagnosis of NEOPLASM OF APPENDIX.  The various methods of treatment have been discussed with the patient and family. After consideration of risks, benefits and other options for treatment, the patient has consented to    Procedure(s): APPENDECTOMY, LAPAROSCOPIC (N/A) as a surgical intervention.    The patient's history has been reviewed, patient examined, no change in status, stable for surgery.  I have reviewed the patient's chart and labs.  Questions were answered to the patient's satisfaction.    Krystal Spinner, MD [facility] practice Office: [phone]   Krystal Spinner

## 2024-02-09 NOTE — Anesthesia Procedure Notes (Signed)
 Procedure Name: Intubation Date/Time: 02/09/2024 7:35 AM  Performed by: Metta Andrea NOVAK, CRNAPre-anesthesia Checklist: Patient identified, Emergency Drugs available, Suction available, Patient being monitored and Timeout performed Patient Re-evaluated:Patient Re-evaluated prior to induction Oxygen Delivery Method: Circle system utilized Preoxygenation: Pre-oxygenation with 100% oxygen Induction Type: IV induction, Rapid sequence and Cricoid Pressure applied Laryngoscope Size: 4 and Glidescope Grade View: Grade I Tube type: Oral Tube size: 7.5 mm Number of attempts: 1 Airway Equipment and Method: Stylet Placement Confirmation: ETT inserted through vocal cords under direct vision, positive ETCO2 and breath sounds checked- equal and bilateral Secured at: 22 cm Tube secured with: Tape Dental Injury: Teeth and Oropharynx as per pre-operative assessment

## 2024-02-09 NOTE — Transfer of Care (Signed)
 Immediate Anesthesia Transfer of Care Note  Patient: [name]  Procedure(s) Performed: APPENDECTOMY, LAPAROSCOPIC (Abdomen)  Patient Location: PACU  Anesthesia Type:General  Level of Consciousness: awake, alert , and patient cooperative  Airway & Oxygen Therapy: Patient Spontanous Breathing and Patient connected to face mask oxygen  Post-op Assessment: Report given to RN and Post -op Vital signs reviewed and stable  Post vital signs: Reviewed and stable  Last Vitals:  Vitals Value Taken Time  BP 181/120 02/09/24 08:35  Temp 36.7 C 02/09/24 08:30  Pulse 92 02/09/24 08:40  Resp 20 02/09/24 08:40  SpO2 95 % 02/09/24 08:40  Vitals shown include unfiled device data.  Last Pain:  Vitals:   02/09/24 0600  TempSrc: Oral  PainSc: 0-No pain         Complications: No notable events documented.

## 2024-02-09 NOTE — Op Note (Signed)
 OPERATIVE REPORT - LAPAROSCOPIC APPENDECTOMY  Preop diagnosis:  Appendiceal neoplasm  Postop diagnosis:  same  Procedure:  Laparoscopic appendectomy  Surgeon:  Krystal Spinner, MD  Anesthesia:  general endotracheal  Estimated blood loss:  minimal  Preparation:  Chlora-prep  Complications:  none  Indications:  Patient is self-referred. Patient had been seen in the emergency department for complaints of back pain. Patient underwent a CT scan of the abdomen and pelvis on January 30, 2024. 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 Review of prior studies dating back to 2023 show progressive enlargement of the appendix. Patient now presents for consideration for appendectomy for definitive diagnosis and management. Patient has had no prior abdominal surgery. 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 Patient is also having issues with his lumbar spine. He has had kyphoplasty in the past.   Procedure:  Patient was brought to the operating room and placed in a supine position on the operating room table. Following administration of general anesthesia, a time out was held and the patient's name and procedure was confirmed. Patient was then prepped and draped in the usual strict aseptic fashion.  After ascertaining that an adequate level of anesthesia had been achieved, a peri-umbilical incision was made with a #15 blade. Dissection was carried down to the fascia. Fascia was incised in the midline and the peritoneal cavity was entered cautiously. A #0-vicryl pursestring suture was placed in the fascia. An Hassan cannula was introduced under direct vision and secured with the pursestring suture. The abdomen was insufflated with carbon dioxide. The laparoscope was introduced and the abdomen was explored. Operative ports were placed in the right upper quadrant and left lower quadrant.  The  cecum was mobilized from its lateral peritoneal attachments using the harmonic scalpel.  The base of the appendix appeared normal.  The tip of the appendix was clearly dilated approximately 2 cm in diameter.  It had a smooth tapering to a normal caliber appendix within one third of the length.  There was no obvious neoplasm visualized.  Mesoappendix was divided with the harmonic scalpel with good hemostasis noted.  Dissection was carried down to the base of the appendix.  Base of the appendix was transected at its junction with the cecal wall with an Endo GIA stapler.  Good hemostasis was noted along the staple line.  The appendix was placed into an endo-catch bag and withdrawn through the umbilical port. The #0-vicryl pursestring suture was tied securely.  Right lower quadrant was irrigated with warm saline which was evacuated. Good hemostasis was noted. Ports were removed under direct vision. Good hemostasis was noted at the port sites. Pneumoperitoneum was released.  Skin incisions were anesthetized with local anesthetic. Wounds were closed with interrupted 4-0 Monocryl subcuticular sutures. Wounds were washed and dried and Dermabond was applied. The patient was awakened from anesthesia and brought to the recovery room. The patient tolerated the procedure well.  Krystal Spinner, MD [facility] Office: [phone]

## 2024-02-09 NOTE — Discharge Instructions (Signed)
 [facility]  LAPAROSCOPIC SURGERY:  POST-OP INSTRUCTIONS  Always review your discharge instruction sheet given to you by the facility where your surgery was performed.  A prescription for pain medication may be given to you upon discharge.  Take your pain medication as prescribed.  If narcotic pain medicine is not needed, then you may take acetaminophen (Tylenol) or ibuprofen (Advil) as needed.  Take your usually prescribed medications unless otherwise directed.  If you need a refill on your pain medication, please contact your pharmacy.  They will contact our office to request authorization. Prescriptions will not be filled after 5 P.M. or on weekends.  You should follow a light diet the first few days after arrival home, such as soup and crackers or toast.  Be sure to include plenty of fluids daily.  Most patients will experience some swelling and bruising in the area of the incisions.  Ice packs will help.  Swelling and bruising can take several days to resolve.   It is common to experience some constipation after surgery.  Increasing fluid intake and taking a stool softener (such as Colace) will usually help or prevent this problem from occurring.  A mild laxative (Milk of Magnesia or Miralax) should be taken according to package instructions if there has been no bowel movement after 48 hours.  You will likely have Dermabond (topical glue) over your incisions.  This seals the incisions and allows you to bathe and shower at any time after your surgery.  Glue should remain in place for up to 10 days.  It may be removed after 10 days by pealing off the Dermabond material or using Vaseline or naval jelly to remove.  If you have steri-strips over your incisions, you may remove the gauze bandage on the second day after surgery, and you may shower at that time.  Leave your steri-strips (small skin tapes) in place directly over the incision.  These strips should remain on the  skin for 5-7 days and then be removed.  You may get them wet in the shower and pat them dry.  Any sutures or staples will be removed at the office during your follow-up visit.  ACTIVITIES:  You may resume regular (light) daily activities beginning the next day - such as daily self-care, walking, climbing stairs - gradually increasing activities as tolerated.  You may have sexual intercourse when it is comfortable.  Refrain from any heavy lifting or straining until approved by your doctor.  You may drive when you are no longer taking prescription pain medication, when you can comfortably wear a seatbelt, and when you can safely maneuver your car and apply brakes.  You should see your doctor in the office for a follow-up appointment approximately 2-3 weeks after your surgery.  Make sure that you call for this appointment within a day or two after you arrive home to insure a convenient appointment time.  WHEN TO CALL YOUR DOCTOR: Fever over 101.0 Inability to urinate Continued bleeding from incision Increased pain, redness, or drainage from the incision Increasing abdominal pain  The clinic staff is available to answer your questions during regular business hours.  Please don't hesitate to call and ask to speak to one of the nurses for clinical concerns.  If you have a medical emergency, go to the nearest emergency room or call 911.  A surgeon from [facility] is always on call for the hospital.  Darnell Level, MD [facility] Office: [phone] Toll Free:  [phone] FAX [phone]  Website: [email or web address]

## 2024-02-10 ENCOUNTER — Encounter (HOSPITAL_COMMUNITY): Payer: Self-pay | Admitting: Surgery

## 2024-02-11 LAB — SURGICAL PATHOLOGY

## 2024-02-13 ENCOUNTER — Ambulatory Visit: Payer: Self-pay | Admitting: Surgery

## 2024-02-13 NOTE — Progress Notes (Signed)
 Pathology report identifies a serrated adenoma.  This appears to be benign, and margins are negative.  I will copy Dr. Medford Pizza, one of our colorectal surgeons, to see if thinks we need to do anything further at this time.  I will have a copy of the pathology report for the patient at his post op visit.  Krystal Spinner, MD [facility] practice Office: [phone]

## 2024-02-18 ENCOUNTER — Other Ambulatory Visit: Payer: Self-pay | Admitting: Physician Assistant

## 2024-02-18 ENCOUNTER — Telehealth: Payer: Self-pay | Admitting: *Deleted

## 2024-02-18 NOTE — Telephone Encounter (Signed)
 TCT patient regarding upcoming appts s/p his recent surgery for appendectomy.  Spoke with him. Advised that we will have him come back to see Dr. Federico the week of 02/29/24 to allow time for him to heal after his appendectomy. Advised to not start his Revlimid  yet until he sees Dr. Federico. He voiced understanding. He states he is doing well post-op. He is seeing his back doctor next week

## 2024-02-24 ENCOUNTER — Other Ambulatory Visit: Payer: Self-pay | Admitting: *Deleted

## 2024-02-24 ENCOUNTER — Encounter: Payer: Self-pay | Admitting: Hematology

## 2024-02-24 ENCOUNTER — Telehealth: Payer: Self-pay | Admitting: Nurse Practitioner

## 2024-02-24 ENCOUNTER — Encounter: Payer: Self-pay | Admitting: Hematology and Oncology

## 2024-02-24 ENCOUNTER — Telehealth: Payer: Self-pay | Admitting: Family Medicine

## 2024-02-24 MED ORDER — LENALIDOMIDE 10 MG PO CAPS: 10.0000 mg | ORAL_CAPSULE | Freq: Every day | ORAL | 0 refills | Status: DC

## 2024-02-24 NOTE — Telephone Encounter (Signed)
 No appts available until 11/04. Can pt be DB? Please advise.

## 2024-02-24 NOTE — Telephone Encounter (Signed)
 Copied from CRM #[id number]. Topic: Appointments - Scheduling Inquiry for Clinic >> Feb 24, 2024 12:03 PM Dedra B wrote: Reason for CRM: Pt would like to schedule appt with Dr. Tanda. Her next available isn't until 04/13/24. He needs to follow up on medications that were stopped for his transplant and he also had appendix surgery on 9/16. Pls call pt to schedule.

## 2024-02-24 NOTE — Telephone Encounter (Signed)
 Scheduled patient for palliative appointment. Called and spoke with the patient, he is aware.

## 2024-02-25 ENCOUNTER — Other Ambulatory Visit: Payer: Self-pay | Admitting: *Deleted

## 2024-02-25 ENCOUNTER — Other Ambulatory Visit (HOSPITAL_COMMUNITY): Payer: Self-pay

## 2024-02-25 MED ORDER — LENALIDOMIDE 10 MG PO CAPS: 10.0000 mg | ORAL_CAPSULE | Freq: Every day | ORAL | 0 refills | Status: DC

## 2024-02-25 NOTE — Telephone Encounter (Signed)
 scheduled

## 2024-02-29 ENCOUNTER — Encounter: Payer: Self-pay | Admitting: Hematology

## 2024-02-29 ENCOUNTER — Encounter: Payer: Self-pay | Admitting: Hematology and Oncology

## 2024-03-02 ENCOUNTER — Inpatient Hospital Stay: Attending: Physician Assistant

## 2024-03-02 ENCOUNTER — Inpatient Hospital Stay (HOSPITAL_BASED_OUTPATIENT_CLINIC_OR_DEPARTMENT_OTHER): Admitting: Hematology and Oncology

## 2024-03-02 ENCOUNTER — Other Ambulatory Visit: Payer: Self-pay | Admitting: Hematology and Oncology

## 2024-03-02 VITALS — BP 132/90 | HR 87 | Temp 98.1°F | Resp 15 | Wt 242.9 lb

## 2024-03-02 DIAGNOSIS — K7689 Other specified diseases of liver: Secondary | ICD-10-CM | POA: Insufficient documentation

## 2024-03-02 DIAGNOSIS — Z9481 Bone marrow transplant status: Secondary | ICD-10-CM | POA: Insufficient documentation

## 2024-03-02 DIAGNOSIS — C9001 Multiple myeloma in remission: Secondary | ICD-10-CM | POA: Diagnosis not present

## 2024-03-02 DIAGNOSIS — Z79624 Long term (current) use of inhibitors of nucleotide synthesis: Secondary | ICD-10-CM | POA: Insufficient documentation

## 2024-03-02 DIAGNOSIS — Z800 Family history of malignant neoplasm of digestive organs: Secondary | ICD-10-CM | POA: Diagnosis not present

## 2024-03-02 DIAGNOSIS — M8440XA Pathological fracture, unspecified site, initial encounter for fracture: Secondary | ICD-10-CM | POA: Diagnosis not present

## 2024-03-02 DIAGNOSIS — Z7189 Other specified counseling: Secondary | ICD-10-CM

## 2024-03-02 DIAGNOSIS — Z79899 Other long term (current) drug therapy: Secondary | ICD-10-CM | POA: Diagnosis not present

## 2024-03-02 DIAGNOSIS — K219 Gastro-esophageal reflux disease without esophagitis: Secondary | ICD-10-CM | POA: Insufficient documentation

## 2024-03-02 DIAGNOSIS — F419 Anxiety disorder, unspecified: Secondary | ICD-10-CM | POA: Insufficient documentation

## 2024-03-02 DIAGNOSIS — N529 Male erectile dysfunction, unspecified: Secondary | ICD-10-CM | POA: Insufficient documentation

## 2024-03-02 DIAGNOSIS — C9000 Multiple myeloma not having achieved remission: Secondary | ICD-10-CM

## 2024-03-02 DIAGNOSIS — K449 Diaphragmatic hernia without obstruction or gangrene: Secondary | ICD-10-CM | POA: Insufficient documentation

## 2024-03-02 DIAGNOSIS — M792 Neuralgia and neuritis, unspecified: Secondary | ICD-10-CM | POA: Diagnosis not present

## 2024-03-02 DIAGNOSIS — E785 Hyperlipidemia, unspecified: Secondary | ICD-10-CM | POA: Diagnosis not present

## 2024-03-02 DIAGNOSIS — Z87891 Personal history of nicotine dependence: Secondary | ICD-10-CM | POA: Insufficient documentation

## 2024-03-02 DIAGNOSIS — Z860100 Personal history of colon polyps, unspecified: Secondary | ICD-10-CM | POA: Diagnosis not present

## 2024-03-02 DIAGNOSIS — Z5112 Encounter for antineoplastic immunotherapy: Secondary | ICD-10-CM | POA: Insufficient documentation

## 2024-03-02 DIAGNOSIS — I10 Essential (primary) hypertension: Secondary | ICD-10-CM | POA: Diagnosis not present

## 2024-03-02 DIAGNOSIS — Z7982 Long term (current) use of aspirin: Secondary | ICD-10-CM | POA: Insufficient documentation

## 2024-03-02 DIAGNOSIS — Z8042 Family history of malignant neoplasm of prostate: Secondary | ICD-10-CM | POA: Diagnosis not present

## 2024-03-02 DIAGNOSIS — G2581 Restless legs syndrome: Secondary | ICD-10-CM | POA: Insufficient documentation

## 2024-03-02 LAB — CMP (CANCER CENTER ONLY)
ALT: 13 U/L (ref 0–44)
AST: 16 U/L (ref 15–41)
Albumin: 4.4 g/dL (ref 3.5–5.0)
Alkaline Phosphatase: 86 U/L (ref 38–126)
Anion gap: 7 (ref 5–15)
BUN: 16 mg/dL (ref 6–20)
CO2: 23 mmol/L (ref 22–32)
Calcium: 9.8 mg/dL (ref 8.9–10.3)
Chloride: 107 mmol/L (ref 98–111)
Creatinine: 1.12 mg/dL (ref 0.61–1.24)
GFR, Estimated: >60 mL/min (ref >60)
Glucose, Bld: 104 mg/dL — ABNORMAL HIGH (ref 70–99)
Potassium: 4.5 mmol/L (ref 3.5–5.1)
Sodium: 137 mmol/L (ref 135–145)
Total Bilirubin: 0.5 mg/dL (ref 0.0–1.2)
Total Protein: 7.5 g/dL (ref 6.5–8.1)

## 2024-03-02 LAB — CBC WITH DIFFERENTIAL (CANCER CENTER ONLY)
Abs Immature Granulocytes: 0.04 K/uL (ref 0.00–0.07)
Basophils Absolute: 0 K/uL (ref 0.0–0.1)
Basophils Relative: 0 %
Eosinophils Absolute: 0.1 K/uL (ref 0.0–0.5)
Eosinophils Relative: 1 %
HCT: 39.8 % (ref 39.0–52.0)
Hemoglobin: 13.4 g/dL (ref 13.0–17.0)
Immature Granulocytes: 1 %
Lymphocytes Relative: 23 %
Lymphs Abs: 2 K/uL (ref 0.7–4.0)
MCH: 28.8 pg (ref 26.0–34.0)
MCHC: 33.7 g/dL (ref 30.0–36.0)
MCV: 85.4 fL (ref 80.0–100.0)
Monocytes Absolute: 0.6 K/uL (ref 0.1–1.0)
Monocytes Relative: 7 %
Neutro Abs: 6 K/uL (ref 1.7–7.7)
Neutrophils Relative %: 68 %
Platelet Count: 195 K/uL (ref 150–400)
RBC: 4.66 MIL/uL (ref 4.22–5.81)
RDW: 13.3 % (ref 11.5–15.5)
WBC Count: 8.7 K/uL (ref 4.0–10.5)
nRBC: 0 % (ref 0.0–0.2)

## 2024-03-02 LAB — LACTATE DEHYDROGENASE: LDH: 119 U/L (ref 98–192)

## 2024-03-02 NOTE — Progress Notes (Signed)
 [facility] [phone]   [phone]  PROGRESS NOTE  Patient Care Team: Tanda Bleacher, MD as PCP - General (Family Medicine) Court Dorn PARAS, MD as PCP - Cardiology (Cardiology) Shaaron Lamar HERO, MD as Attending Physician (Gastroenterology)  Hematological/Oncological History # IgG Kappa Multiple Myeloma, 1p32/1q21.  01/04/2022: MRI lumbar spine shows diffuse heterogeneous marrow signal with heterogeneous enhancement throughout the thoracolumbar spine.  Additionally, there is subacute-chronic L3 vertebral body compression fraction. 01/14/2022: establish care with Dr. Federico  02/10/2022: bone marrow biopsy shows range of plasma cell involvement from 50% on the biopsy to 60% on aspirate smear slides and close to 90% on the clot section for an overall percentage estimated at approximately 80% overall. 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  10/02/2022: Cycle 1 Day 1 of Dara/Rev/Dex 11/03/2022: Cycle 2 Day 1 of Dara/Rev/Dex 11/30/2022:  Cycle 3 Day 1 of Dara/Rev/Dex 12/29/2022: Cycle 4 Day 1 of Dara/Rev/Dex 01/26/2023: Cycle 5 Day 1 of Dara/Rev/Dex 02/23/2023: Cycle 6 Day 1 of Dara/Rev/Dex 03/23/2023: Cycle 7 Day 1 of Dara/Rev/Dex 04/20/2023: Cycle 8 Day 1 of Dara/Rev/Dex 05/19/2023: Cycle 9 Day 1 of Dara/Rev/Dex 1/11-1/17/2025: mobilization and stem cell collection  06/15/2023: Cycle 10 Day 1 of Dara/Rev/Dex 07/14/2023: Cycle 11 Day 1 of  Dara/Rev/Dex 08/04/2023: Underwent autologous stem cell transplant at [facility] on 08/04/2023.   Interval History:  [name] 53 y.o. male for continued management of IgG kappa multiple myeloma. The patient's last visit was on 01/27/2024 and in the interim, he denies any changes to his health.   On exam today [name] reports he has been well overall in the room since her last visit.  He reports his appendectomy went well and he feels much better after it was removed.  He reports he had no subsequent fevers, chills, sweats.  He reports that overall he is trying to be more active and ride his bike.  He notes that he has also been going to the pool in the gym in order to build up his strength.  He reports that he otherwise has not had any recent infectious symptoms such as runny nose, sore throat, cough.  He is willing and able to proceed with maintenance therapy with monthly Darzalex  and Revlimid .  Full 10 point ROS otherwise negative.  MEDICAL HISTORY:  Past Medical History:  Diagnosis Date   Allergy    Anemia    Anxiety    Atypical chest pain    Cancer (HCC)    Multiple myeloma with normocytic anemia   Depression    GERD (gastroesophageal reflux disease)    H/O transfusion of platelets    Hepatic cyst    Benign by MRI   Hiatal hernia    History of autologous stem cell transplant (HCC) 07/2023   History of colonic polyps    Hyperlipidemia    Hypertension    Multiple myeloma (HCC)    Pre-diabetes    Rectal bleeding  Restless leg    Tobacco abuse     SURGICAL HISTORY: Past Surgical History:  Procedure Laterality Date   COLONOSCOPY  01/10/2009     RMR: Normal rectum, normal colon; repeat in 2015 due to FH of colon cancer   COLONOSCOPY N/A 01/09/2014   MFM:Floupeoz colonic polyps-removed as described   COLONOSCOPY N/A 09/18/2016   Procedure: COLONOSCOPY;  Surgeon: Lamar CHRISTELLA Hollingshead, MD;  Location: [facility];  Service: Endoscopy;  Laterality: N/A;  730     ESOPHAGOGASTRODUODENOSCOPY  10/04/2007   RMR: Distal esophageal erosions consistent with erosive reflux esophagitis, patulous gastroesophageal junction status post passage of a  Maloney dilator, 56 [place].  Otherwise, unremarkable esophagus.  Hiatal hernia.  Otherwise normal stomach.  Bulbar erosion   ESOPHAGOGASTRODUODENOSCOPY N/A 01/09/2014   Erosive reflux esophagitis. Small hiatal hernia   HUMERUS IM NAIL Left 02/22/2022   Procedure: INTRAMEDULLARY (IM) NAIL HUMERAL;  Surgeon: Sharl Selinda Dover, MD;  Location: [facility] OR;  Service: Orthopedics;  Laterality: Left;   I & D EXTREMITY Left 07/26/2018   Procedure: IRRIGATION AND DEBRIDEMENT EXTREMITY;  Surgeon: Lorretta Dess, MD;  Location: [facility] OR;  Service: Plastics;  Laterality: Left;   IR BONE TUMOR(S)RF ABLATION  02/05/2022   IR BONE TUMOR(S)RF ABLATION  02/24/2022   IR KYPHO EA ADDL LEVEL THORACIC OR LUMBAR  02/19/2022   IR KYPHO LUMBAR INC FX REDUCE BONE BX UNI/BIL CANNULATION INC/IMAGING  02/05/2022   IR KYPHO LUMBAR INC FX REDUCE BONE BX UNI/BIL CANNULATION INC/IMAGING  02/19/2022   LAPAROSCOPIC APPENDECTOMY N/A 02/09/2024   Procedure: APPENDECTOMY, LAPAROSCOPIC;  Surgeon: Eletha Boas, MD;  Location: [facility] ORS;  Service: General;  Laterality: N/A;   PERCUTANEOUS PINNING Left 07/26/2018   Procedure: PERCUTANEOUS PINNING EXTREMITY;  Surgeon: Lorretta Dess, MD;  Location: [facility] OR;  Service: Plastics;  Laterality: Left;    SOCIAL HISTORY: Social History   Socioeconomic History   Marital status: Divorced    Spouse name: Not on file   Number of children: 2   Years of education: Not on file   Highest education level: Not on file  Occupational History   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[employer]    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[employer]  Tobacco Use   Smoking status: Former    Current packs/day: 0.00    Average packs/day: 0.3 packs/day for 10.0 years (2.5 ttl pk-yrs)    Types: Cigarettes    Start date: 12/25/2011    Quit date: 12/24/2021     Years since quitting: 2.2   Smokeless tobacco: Never  Vaping Use   Vaping status: Never Used  Substance and Sexual Activity   Alcohol use: Yes    Alcohol/week: 0.0 standard drinks of alcohol    Comment: occasional/wine on the weekends   Drug use: No   Sexual activity: Not on file  Other Topics Concern   Not on file  Social History Narrative   Right handed   Lives alone one story home   Social Drivers of Health   Financial Resource Strain: Low Risk  (02/03/2024)   [facility]   Overall Financial Resource Strain (CARDIA)    How hard is it for you to pay for the very basics like food, housing, medical care, and heating?: Not very hard  Food Insecurity: Food Insecurity Present (02/03/2024)   Received from [facility]   Hunger Vital Sign    Within the past 12 months, you worried that your food would run out before you got the  money to buy more.: Sometimes true    Within the past 12 months, the food you bought just didn't last and you didn't have money to get more.: Sometimes true  Transportation Needs: Unmet Transportation Needs (02/03/2024)   Received from [facility] - Transportation    In the past 12 months, has lack of transportation kept you from medical appointments or from getting medications?: Yes    In the past 12 months, has lack of transportation kept you from meetings, work, or from getting things needed for daily living?: Yes  Physical Activity: Inactive (02/13/2023)   Exercise Vital Sign    Days of Exercise per Week: 0 days    Minutes of Exercise per Session: 0 min  Stress: No Stress Concern Present (02/13/2023)   [employer] of Occupational Health - Occupational Stress Questionnaire    Feeling of Stress : Not at all  Social Connections: Unknown (02/13/2023)   Social Connection and Isolation Panel    Frequency of Communication with Friends and Family: More than three times a week    Frequency of Social Gatherings with Friends and  Family: More than three times a week    Attends Religious Services: More than 4 times per year    Active Member of [employer] or Organizations: No    Attends Banker Meetings: Never    Marital Status: Not on file  Intimate Partner Violence: Not At Risk (07/17/2022)   Humiliation, Afraid, Rape, and Kick questionnaire    Fear of Current or Ex-Partner: No    Emotionally Abused: No    Physically Abused: No    Sexually Abused: No    FAMILY HISTORY: Family History  Problem Relation Age of Onset   Heart disease Mother    Hypertension Mother    Diabetes Father    Hypertension Father    Colon cancer Sister        passed away from colon cancer, in her 5s   Heart attack Maternal Uncle    Prostate cancer Maternal Uncle    Diabetes Other    Pancreatic cancer Neg Hx    Rectal cancer Neg Hx    Esophageal cancer Neg Hx    Stomach cancer Neg Hx     ALLERGIES:  has no known allergies.  MEDICATIONS:  Current Outpatient Medications  Medication Sig Dispense Refill   acetaminophen  (TYLENOL ) 500 MG tablet Take 1,000 mg by mouth every 8 (eight) hours as needed for mild pain (pain score 1-3) or moderate pain (pain score 4-6). (Patient not taking: Reported on 03/03/2024)     acyclovir  (ZOVIRAX ) 400 MG tablet Take 1 tablet (400 mg total) by mouth 2 (two) times daily. (Patient not taking: Reported on 03/03/2024) 60 tablet 0   aspirin  EC 81 MG tablet Take 81 mg by mouth daily. Swallow whole. (Patient not taking: Reported on 03/03/2024)     cetirizine  (ZYRTEC ) 10 MG tablet Take 10 mg by mouth daily as needed for allergies. (Patient not taking: Reported on 03/03/2024)     diazepam  (VALIUM ) 2 MG tablet Take 1 tablet (2 mg total) by mouth every 8 (eight) hours as needed for muscle spasms. (Patient not taking: Reported on 03/03/2024) 5 tablet 0   docusate sodium  (COLACE) 100 MG capsule Take 1 capsule (100 mg total) by mouth 2 (two) times daily as needed for mild constipation. (Patient not taking: Reported  on 03/03/2024) 30 capsule 3   DULoxetine  (CYMBALTA ) 20 MG capsule Take 1 capsule by mouth once daily (  Patient not taking: Reported on 03/03/2024) 90 capsule 0   gabapentin  (NEURONTIN ) 100 MG capsule Take 1 capsule by mouth twice daily (Patient not taking: Reported on 03/03/2024) 60 capsule 0   lenalidomide  (REVLIMID ) 10 MG capsule Take 1 capsule (10 mg total) by mouth daily. [employer] Auth # [id number]   Date Obtained 02/24/24 Take one capsule daily for 21 days and then none for 7 days (Patient not taking: Reported on 03/03/2024) 21 capsule 0   lidocaine  (LIDODERM ) 5 % Place 1 patch onto the skin daily. Remove & Discard patch within 12 hours or as directed by MD (Patient not taking: Reported on 02/05/2024) 14 patch 0   lisinopril  (ZESTRIL ) 10 MG tablet Take 1 tablet (10 mg total) by mouth daily. 90 tablet 0   lisinopril  (ZESTRIL ) 40 MG tablet Take 1 tablet by mouth once daily (Patient not taking: No sig reported) 90 tablet 0   Multiple Vitamin (MULTIVITAMIN) tablet Take 1 tablet by mouth daily. (Patient not taking: Reported on 03/03/2024)     ondansetron  (ZOFRAN ) 8 MG tablet Take 1 tablet (8 mg total) by mouth every 8 (eight) hours as needed for nausea or vomiting. (Patient not taking: Reported on 03/03/2024) 60 tablet 3   oxyCODONE  (OXY IR/ROXICODONE ) 5 MG immediate release tablet Take 1-2 tablets (5-10 mg total) by mouth every 6 (six) hours as needed for moderate pain (pain score 4-6). (Patient not taking: Reported on 03/03/2024) 20 tablet 0   pantoprazole  (PROTONIX ) 40 MG tablet Take 1 tablet by mouth twice daily 60 tablet 6   polyethylene glycol powder (GLYCOLAX /MIRALAX ) 17 GM/SCOOP powder Take 1 capful (17 g) with water  by mouth daily. (Patient taking differently: Take 17 g by mouth daily as needed for mild constipation or moderate constipation.) 238 g 0   rosuvastatin  (CRESTOR ) 10 MG tablet Take 1 tablet by mouth once daily (Patient not taking: Reported on 02/05/2024) 90 tablet 0   sildenafil  (VIAGRA ) 50 MG  tablet TAKE 1 TABLET BY MOUTH ONCE DAILY AS NEEDED FOR ERECTILE DYSFUNCTION 10 tablet 0   No current facility-administered medications for this visit.    REVIEW OF SYSTEMS:   Constitutional: ( - ) fevers, ( - )  chills , ( - ) night sweats Eyes: ( - ) blurriness of vision, ( - ) double vision, ( - ) watery eyes Ears, nose, mouth, throat, and face: ( - ) mucositis, ( - ) sore throat Respiratory: ( - ) cough, ( - ) dyspnea, ( - ) wheezes Cardiovascular: ( - ) palpitation, ( - ) chest discomfort, ( - ) lower extremity swelling Gastrointestinal:  ( - ) nausea, ( -) heartburn, ( - ) change in bowel habits Skin: ( - ) abnormal skin rashes Lymphatics: ( - ) new lymphadenopathy, ( - ) easy bruising Neurological: ( - ) numbness, ( - ) tingling, ( - ) new weaknesses Behavioral/Psych: ( - ) mood change, ( - ) new changes  All other systems were reviewed with the patient and are negative.  PHYSICAL EXAMINATION: ECOG PERFORMANCE STATUS: 1 - Symptomatic but completely ambulatory  Vitals:   03/02/24 0854  BP: (!) 132/90  Pulse: 87  Resp: 15  Temp: 98.1 F (36.7 C)  SpO2: 100%    Filed Weights   03/02/24 0854  Weight: 242 lb 14.4 oz (110.2 kg)     GENERAL: Well-appearing young African-American male, alert, no distress and comfortable SKIN: skin color, texture, turgor are normal, no rashes or significant lesions EYES: conjunctiva are pink and  non-injected, sclera clear. LUNGS: clear to auscultation and percussion with normal breathing effort HEART: regular rate & rhythm and no murmurs and no lower extremity edema Musculoskeletal: no cyanosis of digits and no clubbing  PSYCH: alert & oriented x 3, fluent speech NEURO: no focal motor/sensory deficits  LABORATORY DATA:  I have reviewed the data as listed    Latest Ref Rng & Units 03/02/2024    8:10 AM 01/31/2024    2:50 PM 01/30/2024    4:06 PM  CBC  WBC 4.0 - 10.5 K/uL 8.7  6.4  7.7   Hemoglobin 13.0 - 17.0 g/dL 86.5  86.1  85.6    Hematocrit 39.0 - 52.0 % 39.8  44.8  45.4   Platelets 150 - 400 K/uL 195  195  224        Latest Ref Rng & Units 03/02/2024    8:10 AM 01/31/2024    2:50 PM 01/30/2024    4:06 PM  CMP  Glucose 70 - 99 mg/dL 895  872  96   BUN 6 - 20 mg/dL 16  13  13    Creatinine 0.61 - 1.24 mg/dL 8.87  8.85  8.94   Sodium 135 - 145 mmol/L 137  139  137   Potassium 3.5 - 5.1 mmol/L 4.5  4.2  4.7   Chloride 98 - 111 mmol/L 107  103  101   CO2 22 - 32 mmol/L 23  24  22    Calcium  8.9 - 10.3 mg/dL 9.8  9.7  89.9   Total Protein 6.5 - 8.1 g/dL 7.5  6.7    Total Bilirubin 0.0 - 1.2 mg/dL 0.5  0.4    Alkaline Phos 38 - 126 U/L 86  92    AST 15 - 41 U/L 16  26    ALT 0 - 44 U/L 13  27      Lab Results  Component Value Date   MPROTEIN 0.2 (H) 03/02/2024   MPROTEIN Not Observed 01/27/2024   MPROTEIN Not Observed 10/14/2023   Lab Results  Component Value Date   KPAFRELGTCHN 14.3 03/02/2024   KPAFRELGTCHN 6.0 01/27/2024   KPAFRELGTCHN 5.5 10/14/2023   LAMBDASER 5.6 (L) 03/02/2024   LAMBDASER 2.9 (L) 01/27/2024   LAMBDASER 2.1 (L) 10/14/2023   KAPLAMBRATIO 2.55 (H) 03/02/2024   KAPLAMBRATIO 2.07 (H) 01/27/2024   KAPLAMBRATIO 2.62 (H) 10/14/2023   RADIOGRAPHIC STUDIES: No results found.  ASSESSMENT & PLAN [name] 53 y.o. male with medical history significant for IgG kappa multiple myeloma who presents for a follow up visit.  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 -- Recommend VRD chemotherapy with intention of proceeding to transplant. -- Labs at each visit to include CBC, CMP, LDH with monthly restaging labs SPEP and serum free light chains -- Started VRD therapy on 02/26/2022. Held Velcade  therapy on 09/24/2022 due to development of ocular toxicity. --Switched to Dara/Rev/Dex on 10/02/2022. Last dose on 07/13/2023. --Underwent bone marrow  transplant at [facility] on 08/04/2023.  PLAN: --Labs from today were reviewed and require no intervention. WBC 8.7, Hgb 13.4, MCV 85.4, Plt 195. creatinine and calcium  normal. Myeloma panel pending --Last bone marrow biopsy from 11/16/2023 demonstrates a normocellular marrow with no increase in plasma cells (2%), confirmed by CD138 immunohistochemistry. There is no morphologic evidence of involvement by plasma cell neoplasm  --Recommended maintenance therapy with daratumumab   plus Revlimid  --Plan to resume Xgeva  therapy till November 2025 to complete 2 years of therapy.  --Tentative return in 1-2 weeks to start maintenance Dara/Rev.   #Erectile Dysfunction -- Likely secondary to chemotherapy. --prescribed Viagra  50 mg p.o. as needed x 10 pills.  --Patient notes he is not getting the results he would like, encouraged him to double the dose to Viagra  100 mg p.o. to see if that is more effective. -- If ineffective could consider Cialis.  # H/O Leg Discomfort/Restless Leg -- Patient provided with Requip  by the emergency department, discontinued due to poor efficacy. --Evaluated by Dr. Buckley on 07/28/2022. Recommended to short course of steroids so started prednisone  50 mg daily x 7 days. --Discussed etiologies including iron deficiency so iron levels were checked and was normal.   #Pathologic fractures-secondary to MM: --Involving L1, L2 and L3 compression fracture and left humerus fracture.  --Underwent kyphoplasty of L3 fracture on 02/19/2022 --Underwent medullary nailing of left humeral shaft on 02/22/2022 --Underwent kyphoplasty on 06/04/2022.   #Pain Medication -- Per palliative care patient has weaned off MS contin . Currently conitnues on gabapentin  100 mg BID as needed --Under the care of palliative care team.   #Supportive Care -- chemotherapy education complete -- port placement not required -- zofran  8mg  q8H PRN and compazine  10mg  PO q6H for nausea -- acyclovir  400mg  PO BID for VCZ  prophylaxis. Sent refill today -- allopurinol  300mg  PO daily for TLS prophylaxis  Orders Placed This Encounter  Procedures   IFE, Dara-Specific, Serum    Standing Status:   Future    Expected Date:   03/10/2024    Expiration Date:   03/10/2025   CBC with Differential (Cancer Center Only)    Standing Status:   Future    Expected Date:   03/10/2024    Expiration Date:   03/10/2025   Multiple Myeloma Panel (SPEP&IFE w/QIG)    Standing Status:   Future    Expected Date:   03/10/2024    Expiration Date:   03/10/2025   Kappa/lambda light chains    Standing Status:   Future    Expected Date:   03/10/2024    Expiration Date:   03/10/2025   CMP (Cancer Center only)    Standing Status:   Future    Expected Date:   03/10/2024    Expiration Date:   03/10/2025   IFE, Dara-Specific, Serum    Standing Status:   Future    Expected Date:   04/07/2024    Expiration Date:   04/07/2025   CBC with Differential (Cancer Center Only)    Standing Status:   Future    Expected Date:   04/07/2024    Expiration Date:   04/07/2025   Multiple Myeloma Panel (SPEP&IFE w/QIG)    Standing Status:   Future    Expected Date:   04/07/2024    Expiration Date:   04/07/2025   Kappa/lambda light chains    Standing Status:   Future    Expected Date:   04/07/2024    Expiration Date:   04/07/2025   CMP (Cancer Center only)    Standing Status:   Future    Expected Date:   04/07/2024    Expiration Date:   04/07/2025    All questions were answered. The patient knows to call the clinic with any problems, questions or concerns.  I have spent a total of 30 minutes minutes of face-to-face and non-face-to-face time, preparing to see the patient,performing a medically appropriate  examination, counseling and educating the patient, documenting clinical information in the electronic health record,  and care coordination.  Norleen IVAR Kidney, MD Department of Hematology/Oncology [facility] Phone: [phone] Pager: [phone] Email: [email or web address]

## 2024-03-03 ENCOUNTER — Encounter: Payer: Self-pay | Admitting: Family Medicine

## 2024-03-03 ENCOUNTER — Ambulatory Visit (INDEPENDENT_AMBULATORY_CARE_PROVIDER_SITE_OTHER): Admitting: Family Medicine

## 2024-03-03 VITALS — BP 138/93 | HR 82 | Ht 69.0 in | Wt 242.2 lb

## 2024-03-03 DIAGNOSIS — F419 Anxiety disorder, unspecified: Secondary | ICD-10-CM | POA: Diagnosis not present

## 2024-03-03 DIAGNOSIS — I10 Essential (primary) hypertension: Secondary | ICD-10-CM

## 2024-03-03 DIAGNOSIS — E66812 Obesity, class 2: Secondary | ICD-10-CM | POA: Diagnosis not present

## 2024-03-03 DIAGNOSIS — F32A Depression, unspecified: Secondary | ICD-10-CM | POA: Diagnosis not present

## 2024-03-03 DIAGNOSIS — Z6835 Body mass index (BMI) 35.0-35.9, adult: Secondary | ICD-10-CM

## 2024-03-03 LAB — KAPPA/LAMBDA LIGHT CHAINS
Kappa free light chain: 14.3 mg/L (ref 3.3–19.4)
Kappa, lambda light chain ratio: 2.55 — ABNORMAL HIGH (ref 0.26–1.65)
Lambda free light chains: 5.6 mg/L — ABNORMAL LOW (ref 5.7–26.3)

## 2024-03-03 MED ORDER — LISINOPRIL 10 MG PO TABS: 10.0000 mg | ORAL_TABLET | Freq: Every day | ORAL | 0 refills | Status: AC

## 2024-03-03 NOTE — Progress Notes (Unsigned)
 Established Patient Office Visit  Subjective    Patient ID: [name], male    DOB: 07-08-1970  Age: 53 y.o. MRN: [id number]  CC:  Chief Complaint  Patient presents with   Medical Management of Chronic Issues    HPI [facility] presents for follow up of hypertension. Patient had been off meds after cancer treatment.   Outpatient Encounter Medications as of 03/03/2024  Medication Sig   pantoprazole  (PROTONIX ) 40 MG tablet Take 1 tablet by mouth twice daily   acetaminophen  (TYLENOL ) 500 MG tablet Take 1,000 mg by mouth every 8 (eight) hours as needed for mild pain (pain score 1-3) or moderate pain (pain score 4-6). (Patient not taking: Reported on 03/03/2024)   acyclovir  (ZOVIRAX ) 400 MG tablet Take 1 tablet (400 mg total) by mouth 2 (two) times daily. (Patient not taking: Reported on 03/03/2024)   aspirin  EC 81 MG tablet Take 81 mg by mouth daily. Swallow whole. (Patient not taking: Reported on 03/03/2024)   cetirizine  (ZYRTEC ) 10 MG tablet Take 10 mg by mouth daily as needed for allergies. (Patient not taking: Reported on 03/03/2024)   diazepam  (VALIUM ) 2 MG tablet Take 1 tablet (2 mg total) by mouth every 8 (eight) hours as needed for muscle spasms. (Patient not taking: Reported on 03/03/2024)   docusate sodium  (COLACE) 100 MG capsule Take 1 capsule (100 mg total) by mouth 2 (two) times daily as needed for mild constipation. (Patient not taking: Reported on 03/03/2024)   DULoxetine  (CYMBALTA ) 20 MG capsule Take 1 capsule by mouth once daily (Patient not taking: Reported on 03/03/2024)   gabapentin  (NEURONTIN ) 100 MG capsule Take 1 capsule by mouth twice daily (Patient not taking: Reported on 03/03/2024)   lenalidomide  (REVLIMID ) 10 MG capsule Take 1 capsule (10 mg total) by mouth daily. [employer] Auth # [id number]   Date Obtained 02/24/24 Take one capsule daily for 21 days and then none for 7 days (Patient not taking: Reported on 03/03/2024)   lidocaine  (LIDODERM ) 5 % Place 1 patch onto the  skin daily. Remove & Discard patch within 12 hours or as directed by MD (Patient not taking: Reported on 02/05/2024)   lisinopril  (ZESTRIL ) 40 MG tablet Take 1 tablet by mouth once daily (Patient not taking: No sig reported)   Multiple Vitamin (MULTIVITAMIN) tablet Take 1 tablet by mouth daily. (Patient not taking: Reported on 03/03/2024)   ondansetron  (ZOFRAN ) 8 MG tablet Take 1 tablet (8 mg total) by mouth every 8 (eight) hours as needed for nausea or vomiting. (Patient not taking: Reported on 03/03/2024)   oxyCODONE  (OXY IR/ROXICODONE ) 5 MG immediate release tablet Take 1-2 tablets (5-10 mg total) by mouth every 6 (six) hours as needed for moderate pain (pain score 4-6). (Patient not taking: Reported on 03/03/2024)   polyethylene glycol powder (GLYCOLAX /MIRALAX ) 17 GM/SCOOP powder Take 1 capful (17 g) with water  by mouth daily. (Patient taking differently: Take 17 g by mouth daily as needed for mild constipation or moderate constipation.)   rosuvastatin  (CRESTOR ) 10 MG tablet Take 1 tablet by mouth once daily (Patient not taking: Reported on 02/05/2024)   sildenafil  (VIAGRA ) 50 MG tablet TAKE 1 TABLET BY MOUTH ONCE DAILY AS NEEDED FOR ERECTILE DYSFUNCTION   No facility-administered encounter medications on file as of 03/03/2024.    Past Medical History:  Diagnosis Date   Allergy    Anemia    Anxiety    Atypical chest pain    Cancer (HCC)    Multiple myeloma with normocytic  anemia   Depression    GERD (gastroesophageal reflux disease)    H/O transfusion of platelets    Hepatic cyst    Benign by MRI   Hiatal hernia    History of autologous stem cell transplant (HCC) 07/2023   History of colonic polyps    Hyperlipidemia    Hypertension    Multiple myeloma (HCC)    Pre-diabetes    Rectal bleeding    Restless leg    Tobacco abuse     Past Surgical History:  Procedure Laterality Date   COLONOSCOPY  01/10/2009     RMR: Normal rectum, normal colon; repeat in 2015 due to FH of colon  cancer   COLONOSCOPY N/A 01/09/2014   MFM:Floupeoz colonic polyps-removed as described   COLONOSCOPY N/A 09/18/2016   Procedure: COLONOSCOPY;  Surgeon: Lamar CHRISTELLA Hollingshead, MD;  Location[facility];  Service: Endoscopy;  Laterality: N/A;  730    ESOPHAGOGASTRODUODENOSCOPY  10/04/2007   RMR: Distal esophageal erosions consistent with erosive reflux esophagitis, patulous gastroesophageal junction status post passage of a  Maloney dilator, 56 [place].  Otherwise, unremarkable esophagus.  Hiatal hernia.  Otherwise normal stomach.  Bulbar erosion   ESOPHAGOGASTRODUODENOSCOPY N/A 01/09/2014   Erosive reflux esophagitis. Small hiatal hernia   HUMERUS IM NAIL Left 02/22/2022   Procedure: INTRAMEDULLARY (IM) NAIL HUMERAL;  Surgeon: Sharl Selinda Dover, MD;  Location: [facility] OR;  Service: Orthopedics;  Laterality: Left;   I & D EXTREMITY Left 07/26/2018   Procedure: IRRIGATION AND DEBRIDEMENT EXTREMITY;  Surgeon: Lorretta Dess, MD;  Location: [facility] OR;  Service: Plastics;  Laterality: Left;   IR BONE TUMOR(S)RF ABLATION  02/05/2022   IR BONE TUMOR(S)RF ABLATION  02/24/2022   IR KYPHO EA ADDL LEVEL THORACIC OR LUMBAR  02/19/2022   IR KYPHO LUMBAR INC FX REDUCE BONE BX UNI/BIL CANNULATION INC/IMAGING  02/05/2022   IR KYPHO LUMBAR INC FX REDUCE BONE BX UNI/BIL CANNULATION INC/IMAGING  02/19/2022   LAPAROSCOPIC APPENDECTOMY N/A 02/09/2024   Procedure: APPENDECTOMY, LAPAROSCOPIC;  Surgeon: Eletha Boas, MD;  Location: [facility];  Service: General;  Laterality: N/A;   PERCUTANEOUS PINNING Left 07/26/2018   Procedure: PERCUTANEOUS PINNING EXTREMITY;  Surgeon: Lorretta Dess, MD;  Location: [facility] OR;  Service: Plastics;  Laterality: Left;    Family History  Problem Relation Age of Onset   Heart disease Mother    Hypertension Mother    Diabetes Father    Hypertension Father    Colon cancer Sister        passed away from colon cancer, in her 74s   Heart attack Maternal Uncle    Prostate cancer Maternal Uncle    Diabetes Other     Pancreatic cancer Neg Hx    Rectal cancer Neg Hx    Esophageal cancer Neg Hx    Stomach cancer Neg Hx     Social History   Socioeconomic History   Marital status: Divorced    Spouse name: Not on file   Number of children: 2   Years of education: Not on file   Highest education level: Not on file  Occupational History   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[employer]    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[employer]  Tobacco Use   Smoking status: Former    Current packs/day: 0.00    Average packs/day: 0.3 packs/day for 10.0 years (2.5 ttl pk-yrs)    Types: Cigarettes    Start date: 12/25/2011    Quit date: 12/24/2021    Years  since quitting: 2.1   Smokeless tobacco: Never  Vaping Use   Vaping status: Never Used  Substance and Sexual Activity   Alcohol use: Yes    Alcohol/week: 0.0 standard drinks of alcohol    Comment: occasional/wine on the weekends   Drug use: No   Sexual activity: Not on file  Other Topics Concern   Not on file  Social History Narrative   Right handed   Lives alone one story home   Social Drivers of Health   Financial Resource Strain: Low Risk  (02/03/2024)   Received from [facility]   Overall Financial Resource Strain (CARDIA)    How hard is it for you to pay for the very basics like food, housing, medical care, and heating?: Not very hard  Food Insecurity: Food Insecurity Present (02/03/2024)   Received from [facility]   Hunger Vital Sign    Within the past 12 months, you worried that your food would run out before you got the money to buy more.: Sometimes true    Within the past 12 months, the food you bought just didn't last and you didn't have money to get more.: Sometimes true  Transportation Needs: Unmet Transportation Needs (02/03/2024)   Received from [facility] - Transportation    In the past 12 months, has lack of transportation kept you from medical appointments or from getting medications?: Yes    In the past 12  months, has lack of transportation kept you from meetings, work, or from getting things needed for daily living?: Yes  Physical Activity: Inactive (02/13/2023)   Exercise Vital Sign    Days of Exercise per Week: 0 days    Minutes of Exercise per Session: 0 min  Stress: No Stress Concern Present (02/13/2023)   [employer] of Occupational Health - Occupational Stress Questionnaire    Feeling of Stress : Not at all  Social Connections: Unknown (02/13/2023)   Social Connection and Isolation Panel    Frequency of Communication with Friends and Family: More than three times a week    Frequency of Social Gatherings with Friends and Family: More than three times a week    Attends Religious Services: More than 4 times per year    Active Member of [employer] or Organizations: No    Attends Banker Meetings: Never    Marital Status: Not on file  Intimate Partner Violence: Not At Risk (07/17/2022)   Humiliation, Afraid, Rape, and Kick questionnaire    Fear of Current or Ex-Partner: No    Emotionally Abused: No    Physically Abused: No    Sexually Abused: No    ROS      Objective    BP (!) 138/93   Pulse 82   Ht 5' 9 (1.753 m)   Wt 242 lb 3.2 oz (109.9 kg)   SpO2 95%   BMI 35.77 kg/m   Physical Exam  {Labs (Optional):23779}    Assessment & Plan:   There are no diagnoses linked to this encounter.   No follow-ups on file.   Tanda Raguel SQUIBB, MD

## 2024-03-04 ENCOUNTER — Encounter: Payer: Self-pay | Admitting: Hematology

## 2024-03-07 ENCOUNTER — Telehealth: Payer: Self-pay | Admitting: Surgery

## 2024-03-07 ENCOUNTER — Telehealth: Payer: Self-pay | Admitting: Hematology and Oncology

## 2024-03-07 LAB — MULTIPLE MYELOMA PANEL, SERUM
Albumin SerPl Elph-Mcnc: 3.8 g/dL (ref 2.9–4.4)
Albumin/Glob SerPl: 1.3 (ref 0.7–1.7)
Alpha 1: 0.2 g/dL (ref 0.0–0.4)
Alpha2 Glob SerPl Elph-Mcnc: 0.9 g/dL (ref 0.4–1.0)
B-Globulin SerPl Elph-Mcnc: 1 g/dL (ref 0.7–1.3)
Gamma Glob SerPl Elph-Mcnc: 0.9 g/dL (ref 0.4–1.8)
Globulin, Total: 3 g/dL (ref 2.2–3.9)
IgA: 57 mg/dL — ABNORMAL LOW (ref 90–386)
IgG (Immunoglobin G), Serum: 996 mg/dL (ref 603–1613)
IgM (Immunoglobulin M), Srm: 74 mg/dL (ref 20–172)
M Protein SerPl Elph-Mcnc: 0.2 g/dL — ABNORMAL HIGH
Total Protein ELP: 6.8 g/dL (ref 6.0–8.5)

## 2024-03-07 NOTE — Telephone Encounter (Signed)
 Pt called wanting clarification on when to restart Revlimid . Per Dr Federico, pt to restart on his next treatment day, 10/16. Pt verbalized understanding and no other concerns at this time.

## 2024-03-08 ENCOUNTER — Encounter: Payer: Self-pay | Admitting: Hematology and Oncology

## 2024-03-08 ENCOUNTER — Telehealth: Payer: Self-pay

## 2024-03-08 ENCOUNTER — Encounter: Payer: Self-pay | Admitting: Hematology

## 2024-03-08 NOTE — Telephone Encounter (Signed)
 Pt called regarding ability to take prednisolone for his back. Per Dr Federico, ok to take. Pt aware and appreciative.

## 2024-03-09 ENCOUNTER — Other Ambulatory Visit (HOSPITAL_COMMUNITY): Payer: Self-pay

## 2024-03-10 ENCOUNTER — Inpatient Hospital Stay

## 2024-03-10 ENCOUNTER — Other Ambulatory Visit: Payer: Self-pay | Admitting: Physician Assistant

## 2024-03-10 VITALS — BP 138/82 | HR 80 | Temp 97.3°F

## 2024-03-10 DIAGNOSIS — C9000 Multiple myeloma not having achieved remission: Secondary | ICD-10-CM

## 2024-03-10 DIAGNOSIS — Z5112 Encounter for antineoplastic immunotherapy: Secondary | ICD-10-CM | POA: Diagnosis not present

## 2024-03-10 DIAGNOSIS — Z7189 Other specified counseling: Secondary | ICD-10-CM

## 2024-03-10 LAB — CMP (CANCER CENTER ONLY)
ALT: 15 U/L (ref 0–44)
AST: 15 U/L (ref 15–41)
Albumin: 4.3 g/dL (ref 3.5–5.0)
Alkaline Phosphatase: 81 U/L (ref 38–126)
Anion gap: 6 (ref 5–15)
BUN: 16 mg/dL (ref 6–20)
CO2: 27 mmol/L (ref 22–32)
Calcium: 9.6 mg/dL (ref 8.9–10.3)
Chloride: 104 mmol/L (ref 98–111)
Creatinine: 1.05 mg/dL (ref 0.61–1.24)
GFR, Estimated: >60 mL/min (ref >60)
Glucose, Bld: 113 mg/dL — ABNORMAL HIGH (ref 70–99)
Potassium: 3.4 mmol/L — ABNORMAL LOW (ref 3.5–5.1)
Sodium: 137 mmol/L (ref 135–145)
Total Bilirubin: 0.4 mg/dL (ref 0.0–1.2)
Total Protein: 7 g/dL (ref 6.5–8.1)

## 2024-03-10 LAB — CBC WITH DIFFERENTIAL (CANCER CENTER ONLY)
Abs Immature Granulocytes: 0.06 K/uL (ref 0.00–0.07)
Basophils Absolute: 0 K/uL (ref 0.0–0.1)
Basophils Relative: 0 %
Eosinophils Absolute: 0 K/uL (ref 0.0–0.5)
Eosinophils Relative: 0 %
HCT: 40.1 % (ref 39.0–52.0)
Hemoglobin: 13.4 g/dL (ref 13.0–17.0)
Immature Granulocytes: 1 %
Lymphocytes Relative: 38 %
Lymphs Abs: 3.1 K/uL (ref 0.7–4.0)
MCH: 28.8 pg (ref 26.0–34.0)
MCHC: 33.4 g/dL (ref 30.0–36.0)
MCV: 86.2 fL (ref 80.0–100.0)
Monocytes Absolute: 0.5 K/uL (ref 0.1–1.0)
Monocytes Relative: 6 %
Neutro Abs: 4.4 K/uL (ref 1.7–7.7)
Neutrophils Relative %: 55 %
Platelet Count: 209 K/uL (ref 150–400)
RBC: 4.65 MIL/uL (ref 4.22–5.81)
RDW: 13.6 % (ref 11.5–15.5)
WBC Count: 8.1 K/uL (ref 4.0–10.5)
nRBC: 0 % (ref 0.0–0.2)

## 2024-03-10 MED ORDER — DENOSUMAB 120 MG/1.7ML ~~LOC~~ SOLN: 120.0000 mg | Freq: Once | SUBCUTANEOUS | Status: DC

## 2024-03-10 MED ORDER — DENOSUMAB 120 MG/1.7ML ~~LOC~~ SOLN
120.0000 mg | Freq: Once | SUBCUTANEOUS | Status: AC
  Administered 2024-03-10: 120 mg via SUBCUTANEOUS
  Filled 2024-03-10: qty 1.7

## 2024-03-10 MED ORDER — DEXAMETHASONE 6 MG PO TABS
40.0000 mg | ORAL_TABLET | Freq: Once | ORAL | Status: AC
  Administered 2024-03-10: 40 mg via ORAL
  Filled 2024-03-10: qty 1

## 2024-03-10 MED ORDER — FAMOTIDINE 20 MG PO TABS
20.0000 mg | ORAL_TABLET | Freq: Once | ORAL | Status: AC
  Administered 2024-03-10: 20 mg via ORAL
  Filled 2024-03-10: qty 1

## 2024-03-10 MED ORDER — ACETAMINOPHEN 325 MG PO TABS
650.0000 mg | ORAL_TABLET | Freq: Once | ORAL | Status: AC
  Administered 2024-03-10: 650 mg via ORAL
  Filled 2024-03-10: qty 2

## 2024-03-10 MED ORDER — DARATUMUMAB-HYALURONIDASE-FIHJ 1800-30000 MG-UT/15ML ~~LOC~~ SOLN
1800.0000 mg | Freq: Once | SUBCUTANEOUS | Status: AC
  Administered 2024-03-10: 1800 mg via SUBCUTANEOUS
  Filled 2024-03-10: qty 15

## 2024-03-10 MED ORDER — DIPHENHYDRAMINE HCL 25 MG PO CAPS
50.0000 mg | ORAL_CAPSULE | Freq: Once | ORAL | Status: AC
  Administered 2024-03-10: 50 mg via ORAL
  Filled 2024-03-10: qty 2

## 2024-03-10 NOTE — Patient Instructions (Signed)
 [facility] - A DEPT OF [facility]  Discharge Instructions: Thank you for choosing [facility] to provide your oncology and hematology care.   If you have a lab appointment with the Cancer Center, please go directly to the Cancer Center and check in at the registration area.   Wear comfortable clothing and clothing appropriate for easy access to any Portacath or PICC line.   We strive to give you quality time with your provider. You may need to reschedule your appointment if you arrive late (15 or more minutes).  Arriving late affects you and other patients whose appointments are after yours.  Also, if you miss three or more appointments without notifying the office, you may be dismissed from the clinic at the provider's discretion.      For prescription refill requests, have your pharmacy contact our office and allow 72 hours for refills to be completed.    Today you received the following chemotherapy and/or immunotherapy agents: daratumumab -hyaluronidase -fihj (DARZALEX  FASPRO), xgeva      To help prevent nausea and vomiting after your treatment, we encourage you to take your nausea medication as directed.  BELOW ARE SYMPTOMS THAT SHOULD BE REPORTED IMMEDIATELY: *FEVER GREATER THAN 100.4 F (38 C) OR HIGHER *CHILLS OR SWEATING *NAUSEA AND VOMITING THAT IS NOT CONTROLLED WITH YOUR NAUSEA MEDICATION *UNUSUAL SHORTNESS OF BREATH *UNUSUAL BRUISING OR BLEEDING *URINARY PROBLEMS (pain or burning when urinating, or frequent urination) *BOWEL PROBLEMS (unusual diarrhea, constipation, pain near the anus) TENDERNESS IN MOUTH AND THROAT WITH OR WITHOUT PRESENCE OF ULCERS (sore throat, sores in mouth, or a toothache) UNUSUAL RASH, SWELLING OR PAIN  UNUSUAL VAGINAL DISCHARGE OR ITCHING   Items with * indicate a potential emergency and should be followed up as soon as possible or go to the Emergency Department if any problems should occur.  Please show  the CHEMOTHERAPY ALERT CARD or IMMUNOTHERAPY ALERT CARD at check-in to the Emergency Department and triage nurse.  Should you have questions after your visit or need to cancel or reschedule your appointment, please contact [facility] - A DEPT OF [facility]  Dept: [phone]  and follow the prompts.  Office hours are 8:00 a.m. to 4:30 p.m. Monday - Friday. Please note that voicemails left after 4:00 p.m. may not be returned until the following business day.  We are closed weekends and major holidays. You have access to a nurse at all times for urgent questions. Please call the main number to the clinic Dept: [phone] and follow the prompts.   For any non-urgent questions, you may also contact your provider using MyChart. We now offer e-Visits for anyone 59 and older to request care online for non-urgent symptoms. For details visit [email or web address].   Also download the MyChart app! Go to the app store, search MyChart, open the app, select [place], and log in with your MyChart username and password.

## 2024-03-11 LAB — KAPPA/LAMBDA LIGHT CHAINS
Kappa free light chain: 11.4 mg/L (ref 3.3–19.4)
Kappa, lambda light chain ratio: 2.92 — ABNORMAL HIGH (ref 0.26–1.65)
Lambda free light chains: 3.9 mg/L — ABNORMAL LOW (ref 5.7–26.3)

## 2024-03-13 LAB — IFE, DARA-SPECIFIC, SERUM
IgA: 48 mg/dL — ABNORMAL LOW (ref 90–386)
IgG (Immunoglobin G), Serum: 966 mg/dL (ref 603–1613)
IgM (Immunoglobulin M), Srm: 56 mg/dL (ref 20–172)

## 2024-03-14 ENCOUNTER — Encounter: Payer: Self-pay | Admitting: Hematology

## 2024-03-14 ENCOUNTER — Encounter: Payer: Self-pay | Admitting: Hematology and Oncology

## 2024-03-14 ENCOUNTER — Encounter: Payer: Self-pay | Admitting: Physician Assistant

## 2024-03-14 ENCOUNTER — Telehealth: Payer: Self-pay | Admitting: *Deleted

## 2024-03-14 LAB — MULTIPLE MYELOMA PANEL, SERUM
Albumin SerPl Elph-Mcnc: 3.7 g/dL (ref 2.9–4.4)
Albumin/Glob SerPl: 1.2 (ref 0.7–1.7)
Alpha 1: 0.2 g/dL (ref 0.0–0.4)
Alpha2 Glob SerPl Elph-Mcnc: 0.9 g/dL (ref 0.4–1.0)
B-Globulin SerPl Elph-Mcnc: 1.1 g/dL (ref 0.7–1.3)
Gamma Glob SerPl Elph-Mcnc: 0.9 g/dL (ref 0.4–1.8)
Globulin, Total: 3.1 g/dL (ref 2.2–3.9)
IgA: 47 mg/dL — ABNORMAL LOW (ref 90–386)
IgG (Immunoglobin G), Serum: 980 mg/dL (ref 603–1613)
IgM (Immunoglobulin M), Srm: 58 mg/dL (ref 20–172)
M Protein SerPl Elph-Mcnc: 0.3 g/dL — ABNORMAL HIGH
Total Protein ELP: 6.8 g/dL (ref 6.0–8.5)

## 2024-03-14 NOTE — Telephone Encounter (Signed)
 Received vm message from pt requesting call back regarding reddened area on his abd from his Dara injection last Thursday.  Discussed with Dr. Federico. Attempted call back to pt. No answer Responded to his MyChart message instead. Received call back from pt. Advised that the redness could just be a reaction to the Dara and it will resolve on its own over the new few days. If it is warm to the touch or tender, it might be a cellulitis. Pt states  it is not tender nor hot/warm to touch. Advised the mark the current perimeter of the redness with a marker. If it extends beyond that line, advised to call us  back as it may be a cellulitis. Advised to call if it begins to be painful or warm to the touch. Pt voiced understanding.

## 2024-03-17 ENCOUNTER — Inpatient Hospital Stay

## 2024-03-25 ENCOUNTER — Other Ambulatory Visit: Payer: Self-pay | Admitting: Nurse Practitioner

## 2024-03-25 DIAGNOSIS — K30 Functional dyspepsia: Secondary | ICD-10-CM

## 2024-03-25 DIAGNOSIS — Z515 Encounter for palliative care: Secondary | ICD-10-CM

## 2024-03-25 DIAGNOSIS — C9000 Multiple myeloma not having achieved remission: Secondary | ICD-10-CM

## 2024-03-28 ENCOUNTER — Other Ambulatory Visit: Payer: Self-pay

## 2024-03-28 ENCOUNTER — Other Ambulatory Visit: Payer: Self-pay | Admitting: *Deleted

## 2024-03-28 MED ORDER — LENALIDOMIDE 10 MG PO CAPS: 10.0000 mg | ORAL_CAPSULE | Freq: Every day | ORAL | 0 refills | Status: DC

## 2024-03-28 MED ORDER — SILDENAFIL CITRATE 50 MG PO TABS: 50.0000 mg | ORAL_TABLET | Freq: Every day | ORAL | 0 refills | Status: DC | PRN

## 2024-03-29 ENCOUNTER — Telehealth: Payer: Self-pay

## 2024-03-29 NOTE — Telephone Encounter (Signed)
 Spoke to pt confirming recent prescriptions were sent to his pharmacy [employer])

## 2024-03-29 NOTE — Telephone Encounter (Signed)
 Pt left VM requesting call back for medication question. No answer on call back, phone number for [facility] stated

## 2024-03-31 ENCOUNTER — Telehealth: Payer: Self-pay | Admitting: *Deleted

## 2024-03-31 ENCOUNTER — Encounter: Payer: Self-pay | Admitting: Physician Assistant

## 2024-03-31 NOTE — Telephone Encounter (Signed)
 Received vm message from [name] requesting a call back. TCT [name] and spoke with him. He is asking if it is ok for him to go to the dentist. Spoke with Dr. Federico. Dr. Federico is ok with [name] going to the dentist. Advised [name] of the above. Kabir states he will fax the dental clearance form to us  for Dr. Federico to sign.

## 2024-04-06 ENCOUNTER — Encounter: Payer: Self-pay | Admitting: *Deleted

## 2024-04-06 ENCOUNTER — Inpatient Hospital Stay

## 2024-04-06 ENCOUNTER — Other Ambulatory Visit (HOSPITAL_COMMUNITY): Payer: Self-pay

## 2024-04-06 ENCOUNTER — Inpatient Hospital Stay (HOSPITAL_BASED_OUTPATIENT_CLINIC_OR_DEPARTMENT_OTHER): Admitting: Physician Assistant

## 2024-04-06 ENCOUNTER — Inpatient Hospital Stay: Attending: Physician Assistant

## 2024-04-06 ENCOUNTER — Other Ambulatory Visit: Payer: Self-pay | Admitting: Hematology and Oncology

## 2024-04-06 ENCOUNTER — Inpatient Hospital Stay: Admitting: Nurse Practitioner

## 2024-04-06 VITALS — BP 135/97 | HR 75 | Temp 98.7°F | Resp 20 | Wt 241.8 lb

## 2024-04-06 DIAGNOSIS — E785 Hyperlipidemia, unspecified: Secondary | ICD-10-CM | POA: Diagnosis not present

## 2024-04-06 DIAGNOSIS — M8440XA Pathological fracture, unspecified site, initial encounter for fracture: Secondary | ICD-10-CM | POA: Diagnosis not present

## 2024-04-06 DIAGNOSIS — C9001 Multiple myeloma in remission: Secondary | ICD-10-CM | POA: Diagnosis not present

## 2024-04-06 DIAGNOSIS — R530 Neoplastic (malignant) related fatigue: Secondary | ICD-10-CM

## 2024-04-06 DIAGNOSIS — C9000 Multiple myeloma not having achieved remission: Secondary | ICD-10-CM | POA: Diagnosis not present

## 2024-04-06 DIAGNOSIS — Z5112 Encounter for antineoplastic immunotherapy: Secondary | ICD-10-CM | POA: Insufficient documentation

## 2024-04-06 DIAGNOSIS — N529 Male erectile dysfunction, unspecified: Secondary | ICD-10-CM | POA: Diagnosis not present

## 2024-04-06 DIAGNOSIS — Z800 Family history of malignant neoplasm of digestive organs: Secondary | ICD-10-CM | POA: Diagnosis not present

## 2024-04-06 DIAGNOSIS — Z7189 Other specified counseling: Secondary | ICD-10-CM

## 2024-04-06 DIAGNOSIS — K219 Gastro-esophageal reflux disease without esophagitis: Secondary | ICD-10-CM | POA: Diagnosis not present

## 2024-04-06 DIAGNOSIS — Z8042 Family history of malignant neoplasm of prostate: Secondary | ICD-10-CM | POA: Insufficient documentation

## 2024-04-06 DIAGNOSIS — Z515 Encounter for palliative care: Secondary | ICD-10-CM

## 2024-04-06 DIAGNOSIS — Z9481 Bone marrow transplant status: Secondary | ICD-10-CM | POA: Insufficient documentation

## 2024-04-06 DIAGNOSIS — I10 Essential (primary) hypertension: Secondary | ICD-10-CM | POA: Insufficient documentation

## 2024-04-06 DIAGNOSIS — Z79624 Long term (current) use of inhibitors of nucleotide synthesis: Secondary | ICD-10-CM | POA: Insufficient documentation

## 2024-04-06 DIAGNOSIS — F17210 Nicotine dependence, cigarettes, uncomplicated: Secondary | ICD-10-CM | POA: Diagnosis not present

## 2024-04-06 DIAGNOSIS — Z860101 Personal history of adenomatous and serrated colon polyps: Secondary | ICD-10-CM | POA: Insufficient documentation

## 2024-04-06 LAB — CBC WITH DIFFERENTIAL (CANCER CENTER ONLY)
Abs Immature Granulocytes: 0.03 K/uL (ref 0.00–0.07)
Basophils Absolute: 0.1 K/uL (ref 0.0–0.1)
Basophils Relative: 1 %
Eosinophils Absolute: 0.2 K/uL (ref 0.0–0.5)
Eosinophils Relative: 5 %
HCT: 41 % (ref 39.0–52.0)
Hemoglobin: 13.5 g/dL (ref 13.0–17.0)
Immature Granulocytes: 1 %
Lymphocytes Relative: 49 %
Lymphs Abs: 2.2 K/uL (ref 0.7–4.0)
MCH: 28 pg (ref 26.0–34.0)
MCHC: 32.9 g/dL (ref 30.0–36.0)
MCV: 85.1 fL (ref 80.0–100.0)
Monocytes Absolute: 0.4 K/uL (ref 0.1–1.0)
Monocytes Relative: 9 %
Neutro Abs: 1.6 K/uL — ABNORMAL LOW (ref 1.7–7.7)
Neutrophils Relative %: 35 %
Platelet Count: 214 K/uL (ref 150–400)
RBC: 4.82 MIL/uL (ref 4.22–5.81)
RDW: 13.1 % (ref 11.5–15.5)
WBC Count: 4.5 K/uL (ref 4.0–10.5)
nRBC: 0 % (ref 0.0–0.2)

## 2024-04-06 LAB — CMP (CANCER CENTER ONLY)
ALT: 13 U/L (ref 0–44)
AST: 17 U/L (ref 15–41)
Albumin: 4.2 g/dL (ref 3.5–5.0)
Alkaline Phosphatase: 91 U/L (ref 38–126)
Anion gap: 7 (ref 5–15)
BUN: 11 mg/dL (ref 6–20)
CO2: 27 mmol/L (ref 22–32)
Calcium: 9.1 mg/dL (ref 8.9–10.3)
Chloride: 103 mmol/L (ref 98–111)
Creatinine: 1.1 mg/dL (ref 0.61–1.24)
GFR, Estimated: >60 mL/min (ref >60)
Glucose, Bld: 94 mg/dL (ref 70–99)
Potassium: 4.1 mmol/L (ref 3.5–5.1)
Sodium: 137 mmol/L (ref 135–145)
Total Bilirubin: 0.4 mg/dL (ref 0.0–1.2)
Total Protein: 7 g/dL (ref 6.5–8.1)

## 2024-04-06 MED ORDER — ACETAMINOPHEN 325 MG PO TABS
650.0000 mg | ORAL_TABLET | Freq: Once | ORAL | Status: AC
  Administered 2024-04-06: 650 mg via ORAL
  Filled 2024-04-06: qty 2

## 2024-04-06 MED ORDER — DEXAMETHASONE 6 MG PO TABS
40.0000 mg | ORAL_TABLET | Freq: Once | ORAL | Status: AC
  Administered 2024-04-06: 40 mg via ORAL
  Filled 2024-04-06: qty 1

## 2024-04-06 MED ORDER — ACYCLOVIR 400 MG PO TABS
400.0000 mg | ORAL_TABLET | Freq: Two times a day (BID) | ORAL | 0 refills | Status: DC
  Filled 2024-04-06: qty 60, 30d supply, fill #0

## 2024-04-06 MED ORDER — DENOSUMAB 120 MG/1.7ML ~~LOC~~ SOLN
120.0000 mg | Freq: Once | SUBCUTANEOUS | Status: AC
  Administered 2024-04-06: 120 mg via SUBCUTANEOUS
  Filled 2024-04-06: qty 1.7

## 2024-04-06 MED ORDER — FAMOTIDINE 20 MG PO TABS
20.0000 mg | ORAL_TABLET | Freq: Once | ORAL | Status: AC
  Administered 2024-04-06: 20 mg via ORAL
  Filled 2024-04-06: qty 1

## 2024-04-06 MED ORDER — DARATUMUMAB-HYALURONIDASE-FIHJ 1800-30000 MG-UT/15ML ~~LOC~~ SOLN
1800.0000 mg | Freq: Once | SUBCUTANEOUS | Status: AC
  Administered 2024-04-06: 1800 mg via SUBCUTANEOUS
  Filled 2024-04-06: qty 15

## 2024-04-06 MED ORDER — DIPHENHYDRAMINE HCL 25 MG PO CAPS
50.0000 mg | ORAL_CAPSULE | Freq: Once | ORAL | Status: AC
  Administered 2024-04-06: 50 mg via ORAL
  Filled 2024-04-06: qty 2

## 2024-04-06 NOTE — Progress Notes (Signed)
 Completed form from [facility] on behalf of patient.  He needs deep periodontal cleaning and implants.  Per Dr Federico no prophylaxis antibiotic needed, he should hold revlimid  and aspirin  for 7 days prior to any procedure.  No restrictions for anesthesia for patient.  Epinephrine  is ok.

## 2024-04-06 NOTE — Progress Notes (Signed)
 [facility] [phone]   [phone]  PROGRESS NOTE  Patient Care Team: Tanda Bleacher, MD as PCP - General (Family Medicine) Court Dorn PARAS, MD as PCP - Cardiology (Cardiology) Shaaron Lamar HERO, MD as Attending Physician (Gastroenterology)  Hematological/Oncological History # IgG Kappa Multiple Myeloma, 1p32/1q21.  01/04/2022: MRI lumbar spine shows diffuse heterogeneous marrow signal with heterogeneous enhancement throughout the thoracolumbar spine.  Additionally, there is subacute-chronic L3 vertebral body compression fraction. 01/14/2022: establish care with Dr. Federico  02/10/2022: bone marrow biopsy shows range of plasma cell involvement from 50% on the biopsy to 60% on aspirate smear slides and close to 90% on the clot section for an overall percentage estimated at approximately 80% overall. 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  10/02/2022: Cycle 1 Day 1 of Dara/Rev/Dex 11/03/2022: Cycle 2 Day 1 of Dara/Rev/Dex 11/30/2022:  Cycle 3 Day 1 of Dara/Rev/Dex 12/29/2022: Cycle 4 Day 1 of Dara/Rev/Dex 01/26/2023: Cycle 5 Day 1 of Dara/Rev/Dex 02/23/2023: Cycle 6 Day 1 of Dara/Rev/Dex 03/23/2023: Cycle 7 Day 1 of Dara/Rev/Dex 04/20/2023: Cycle 8 Day 1 of Dara/Rev/Dex 05/19/2023: Cycle 9 Day 1 of Dara/Rev/Dex 1/11-1/17/2025: mobilization and stem cell collection  06/15/2023: Cycle 10 Day 1 of Dara/Rev/Dex 07/14/2023: Cycle 11 Day 1 of  Dara/Rev/Dex 08/04/2023: Underwent autologous stem cell transplant at [facility] on 08/04/2023.  03/10/2024: Started Dara/Revlimid  therapy  Interval History:  [name] 53 y.o. male for continued management of IgG kappa multiple myeloma. The patient's last visit was on 03/02/2024 and in the interim, he denies any changes to his health.   On exam today Mr. Cuppett reports he is doing well and tolerating treatment without any new or concerning symptoms.  His energy levels are stable and he is able to complete his daily activities on his own.  He denies nausea, vomiting or bowel habit changes.  He denies easy bruising or signs of active bleeding.  He denies fevers, chills, night sweats, shortness of breath, chest pain or cough.  He has no other complaints.  Rest of 10 point ROS as below.  MEDICAL HISTORY:  Past Medical History:  Diagnosis Date   Allergy    Anemia    Anxiety    Atypical chest pain    Cancer (HCC)    Multiple myeloma with normocytic anemia   Depression    GERD (gastroesophageal reflux disease)    H/O transfusion of platelets    Hepatic cyst    Benign by MRI   Hiatal hernia    History of autologous stem cell transplant (HCC) 07/2023   History of colonic polyps    Hyperlipidemia    Hypertension    Multiple myeloma (HCC)    Pre-diabetes    Rectal bleeding    Restless leg    Tobacco abuse     SURGICAL HISTORY: Past Surgical History:  Procedure Laterality Date   COLONOSCOPY  01/10/2009     RMR: Normal rectum, normal colon; repeat in 2015  due to FH of colon cancer   COLONOSCOPY N/A 01/09/2014   MFM:Floupeoz colonic polyps-removed as described   COLONOSCOPY N/A 09/18/2016   Procedure: COLONOSCOPY;  Surgeon: Lamar CHRISTELLA Hollingshead, MD;  Location: [facility];  Service: Endoscopy;  Laterality: N/A;  730    ESOPHAGOGASTRODUODENOSCOPY  10/04/2007   RMR: Distal esophageal erosions consistent with erosive reflux esophagitis, patulous gastroesophageal junction status post passage  of a  Maloney dilator, 56 French.  Otherwise, unremarkable esophagus.  Hiatal hernia.  Otherwise normal stomach.  Bulbar erosion   ESOPHAGOGASTRODUODENOSCOPY N/A 01/09/2014   Erosive reflux esophagitis. Small hiatal hernia   HUMERUS IM NAIL Left 02/22/2022   Procedure: INTRAMEDULLARY (IM) NAIL HUMERAL;  Surgeon: Sharl Selinda Dover, MD;  Location: [facility] OR;  Service: Orthopedics;  Laterality: Left;   I & D EXTREMITY Left 07/26/2018   Procedure: IRRIGATION AND DEBRIDEMENT EXTREMITY;  Surgeon: Lorretta Dess, MD;  Location: [facility] OR;  Service: Plastics;  Laterality: Left;   IR BONE TUMOR(S)RF ABLATION  02/05/2022   IR BONE TUMOR(S)RF ABLATION  02/24/2022   IR KYPHO EA ADDL LEVEL THORACIC OR LUMBAR  02/19/2022   IR KYPHO LUMBAR INC FX REDUCE BONE BX UNI/BIL CANNULATION INC/IMAGING  02/05/2022   IR KYPHO LUMBAR INC FX REDUCE BONE BX UNI/BIL CANNULATION INC/IMAGING  02/19/2022   LAPAROSCOPIC APPENDECTOMY N/A 02/09/2024   Procedure: APPENDECTOMY, LAPAROSCOPIC;  Surgeon: Eletha Boas, MD;  Location: [facility] ORS;  Service: General;  Laterality: N/A;   PERCUTANEOUS PINNING Left 07/26/2018   Procedure: PERCUTANEOUS PINNING EXTREMITY;  Surgeon: Lorretta Dess, MD;  Location: [facility] OR;  Service: Plastics;  Laterality: Left;    SOCIAL HISTORY: Social History   Socioeconomic History   Marital status: Divorced    Spouse name: Not on file   Number of children: 2   Years of education: Not on file   Highest education level: Not on file  Occupational History   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[employer]    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[employer]  Tobacco Use   Smoking status: Former    Current packs/day: 0.00    Average packs/day: 0.3 packs/day for 10.0 years (2.5 ttl pk-yrs)    Types: Cigarettes    Start date: 12/25/2011    Quit date: 12/24/2021    Years since quitting: 2.2   Smokeless tobacco: Never  Vaping Use   Vaping status: Never Used  Substance and Sexual Activity   Alcohol use: Yes    Alcohol/week:  0.0 standard drinks of alcohol    Comment: occasional/wine on the weekends   Drug use: No   Sexual activity: Not on file  Other Topics Concern   Not on file  Social History Narrative   Right handed   Lives alone one story home   Social Drivers of Health   Financial Resource Strain: Low Risk  (02/03/2024)   Received from [facility]   Overall Financial Resource Strain (CARDIA)    How hard is it for you to pay for the very basics like food, housing, medical care, and heating?: Not very hard  Food Insecurity: Food Insecurity Present (02/03/2024)   Received from [facility]   Hunger Vital Sign    Within the past 12 months, you worried that your food would run out before you got the money to buy more.: Sometimes true    Within the past 12 months, the food you bought just didn't last and you didn't have money to get more.: Sometimes true  Transportation Needs: Unmet Transportation Needs (  02/03/2024)   Received from [facility] - Transportation    In the past 12 months, has lack of transportation kept you from medical appointments or from getting medications?: Yes    In the past 12 months, has lack of transportation kept you from meetings, work, or from getting things needed for daily living?: Yes  Physical Activity: Inactive (02/13/2023)   Exercise Vital Sign    Days of Exercise per Week: 0 days    Minutes of Exercise per Session: 0 min  Stress: No Stress Concern Present (02/13/2023)   [employer] of [employer]ational [employer] - Occupational Stress Questionnaire    Feeling of Stress : Not at all  Social Connections: Unknown (02/13/2023)   Social Connection and Isolation Panel    Frequency of Communication with Friends and Family: More than three times a week    Frequency of Social Gatherings with Friends and Family: More than three times a week    Attends Religious Services: More than 4 times per year    Active Member of [employer] or Organizations: No    Attends Tax Inspector Meetings: Never    Marital Status: Not on file  Intimate Partner Violence: Not At Risk (07/17/2022)   Humiliation, Afraid, Rape, and Kick questionnaire    Fear of Current or Ex-Partner: No    Emotionally Abused: No    Physically Abused: No    Sexually Abused: No    FAMILY HISTORY: Family History  Problem Relation Age of Onset   Heart disease Mother    Hypertension Mother    Diabetes Father    Hypertension Father    Colon cancer Sister        passed away from colon cancer, in her 27s   Heart attack Maternal Uncle    Prostate cancer Maternal Uncle    Diabetes Other    Pancreatic cancer Neg Hx    Rectal cancer Neg Hx    Esophageal cancer Neg Hx    Stomach cancer Neg Hx     ALLERGIES:  has no known allergies.  MEDICATIONS:  Current Outpatient Medications  Medication Sig Dispense Refill   acetaminophen  (TYLENOL ) 500 MG tablet Take 1,000 mg by mouth every 8 (eight) hours as needed for mild pain (pain score 1-3) or moderate pain (pain score 4-6). (Patient not taking: Reported on 03/03/2024)     acyclovir  (ZOVIRAX ) 400 MG tablet Take 1 tablet (400 mg total) by mouth 2 (two) times daily. (Patient not taking: Reported on 03/03/2024) 60 tablet 0   aspirin  EC 81 MG tablet Take 81 mg by mouth daily. Swallow whole. (Patient not taking: Reported on 03/03/2024)     cetirizine  (ZYRTEC ) 10 MG tablet Take 10 mg by mouth daily as needed for allergies. (Patient not taking: Reported on 03/03/2024)     diazepam  (VALIUM ) 2 MG tablet Take 1 tablet (2 mg total) by mouth every 8 (eight) hours as needed for muscle spasms. (Patient not taking: Reported on 03/03/2024) 5 tablet 0   docusate sodium  (COLACE) 100 MG capsule Take 1 capsule (100 mg total) by mouth 2 (two) times daily as needed for mild constipation. (Patient not taking: Reported on 03/03/2024) 30 capsule 3   DULoxetine  (CYMBALTA ) 20 MG capsule Take 1 capsule by mouth once daily (Patient not taking: Reported on 03/03/2024) 90 capsule 0    gabapentin  (NEURONTIN ) 100 MG capsule Take 1 capsule by mouth twice daily (Patient not taking: Reported on 03/03/2024) 60 capsule 0   lenalidomide  (REVLIMID ) 10  MG capsule Take 1 capsule (10 mg total) by mouth daily. [employer] Auth #  [id number]  Date Obtained 03/28/24 Take one capsule daily for 21 days and then none for 7 days 21 capsule 0   lidocaine  (LIDODERM ) 5 % Place 1 patch onto the skin daily. Remove & Discard patch within 12 hours or as directed by MD (Patient not taking: Reported on 02/05/2024) 14 patch 0   lisinopril  (ZESTRIL ) 10 MG tablet Take 1 tablet (10 mg total) by mouth daily. 90 tablet 0   lisinopril  (ZESTRIL ) 40 MG tablet Take 1 tablet by mouth once daily (Patient not taking: No sig reported) 90 tablet 0   Multiple Vitamin (MULTIVITAMIN) tablet Take 1 tablet by mouth daily. (Patient not taking: Reported on 03/03/2024)     ondansetron  (ZOFRAN ) 8 MG tablet Take 1 tablet (8 mg total) by mouth every 8 (eight) hours as needed for nausea or vomiting. (Patient not taking: Reported on 03/03/2024) 60 tablet 3   oxyCODONE  (OXY IR/ROXICODONE ) 5 MG immediate release tablet Take 1-2 tablets (5-10 mg total) by mouth every 6 (six) hours as needed for moderate pain (pain score 4-6). (Patient not taking: Reported on 03/03/2024) 20 tablet 0   pantoprazole  (PROTONIX ) 40 MG tablet Take 1 tablet by mouth twice daily 60 tablet 0   polyethylene glycol powder (GLYCOLAX /MIRALAX ) 17 GM/SCOOP powder Take 1 capful (17 g) with water  by mouth daily. (Patient taking differently: Take 17 g by mouth daily as needed for mild constipation or moderate constipation.) 238 g 0   rosuvastatin  (CRESTOR ) 10 MG tablet Take 1 tablet by mouth once daily (Patient not taking: Reported on 02/05/2024) 90 tablet 0   sildenafil  (VIAGRA ) 50 MG tablet Take 1 tablet (50 mg total) by mouth daily as needed for erectile dysfunction. 10 tablet 0   No current facility-administered medications for this visit.    REVIEW OF SYSTEMS:   Constitutional: (  - ) fevers, ( - )  chills , ( - ) night sweats Eyes: ( - ) blurriness of vision, ( - ) double vision, ( - ) watery eyes Ears, nose, mouth, throat, and face: ( - ) mucositis, ( - ) sore throat Respiratory: ( - ) cough, ( - ) dyspnea, ( - ) wheezes Cardiovascular: ( - ) palpitation, ( - ) chest discomfort, ( - ) lower extremity swelling Gastrointestinal:  ( - ) nausea, ( -) heartburn, ( - ) change in bowel habits Skin: ( - ) abnormal skin rashes Lymphatics: ( - ) new lymphadenopathy, ( - ) easy bruising Neurological: ( - ) numbness, ( - ) tingling, ( - ) new weaknesses Behavioral/Psych: ( - ) mood change, ( - ) new changes  All other systems were reviewed with the patient and are negative.  PHYSICAL EXAMINATION: ECOG PERFORMANCE STATUS: 1 - Symptomatic but completely ambulatory  There were no vitals filed for this visit.   There were no vitals filed for this visit.   Day 1, Cycle 13 04/06/24  Weight 241 lb 12 oz (109.7 kg)  Temp 98.7 F (37.1 C)  Temp src Oral  Pulse 75  Resp 20  BP 135/97 (H)    GENERAL: Well-appearing young African-American male, alert, no distress and comfortable SKIN: skin color, texture, turgor are normal, no rashes or significant lesions EYES: conjunctiva are pink and non-injected, sclera clear. LUNGS: clear to auscultation and percussion with normal breathing effort HEART: regular rate & rhythm and no murmurs and no lower extremity edema Musculoskeletal: no cyanosis of  digits and no clubbing  PSYCH: alert & oriented x 3, fluent speech NEURO: no focal motor/sensory deficits  LABORATORY DATA:  I have reviewed the data as listed    Latest Ref Rng & Units 03/10/2024    9:06 AM 03/02/2024    8:10 AM 01/31/2024    2:50 PM  CBC  WBC 4.0 - 10.5 K/uL 8.1  8.7  6.4   Hemoglobin 13.0 - 17.0 g/dL 86.5  86.5  86.1   Hematocrit 39.0 - 52.0 % 40.1  39.8  44.8   Platelets 150 - 400 K/uL 209  195  195        Latest Ref Rng & Units 03/10/2024    9:06 AM  03/02/2024    8:10 AM 01/31/2024    2:50 PM  CMP  Glucose 70 - 99 mg/dL 886  895  872   BUN 6 - 20 mg/dL 16  16  13    Creatinine 0.61 - 1.24 mg/dL 8.94  8.87  8.85   Sodium 135 - 145 mmol/L 137  137  139   Potassium 3.5 - 5.1 mmol/L 3.4  4.5  4.2   Chloride 98 - 111 mmol/L 104  107  103   CO2 22 - 32 mmol/L 27  23  24    Calcium  8.9 - 10.3 mg/dL 9.6  9.8  9.7   Total Protein 6.5 - 8.1 g/dL 7.0  7.5  6.7   Total Bilirubin 0.0 - 1.2 mg/dL 0.4  0.5  0.4   Alkaline Phos 38 - 126 U/L 81  86  92   AST 15 - 41 U/L 15  16  26    ALT 0 - 44 U/L 15  13  27      Lab Results  Component Value Date   MPROTEIN 0.3 (H) 03/10/2024   MPROTEIN 0.2 (H) 03/02/2024   MPROTEIN Not Observed 01/27/2024   Lab Results  Component Value Date   KPAFRELGTCHN 11.4 03/10/2024   KPAFRELGTCHN 14.3 03/02/2024   KPAFRELGTCHN 6.0 01/27/2024   LAMBDASER 3.9 (L) 03/10/2024   LAMBDASER 5.6 (L) 03/02/2024   LAMBDASER 2.9 (L) 01/27/2024   KAPLAMBRATIO 2.92 (H) 03/10/2024   KAPLAMBRATIO 2.55 (H) 03/02/2024   KAPLAMBRATIO 2.07 (H) 01/27/2024   RADIOGRAPHIC STUDIES: No results found.  ASSESSMENT & PLAN [name] 53 y.o. male with medical history significant for IgG kappa multiple myeloma who presents for a follow up visit.  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 -- Recommend VRD chemotherapy with intention of proceeding to transplant. -- Labs at each visit to include CBC, CMP, LDH with monthly restaging labs SPEP and serum free light chains -- Started VRD therapy on 02/26/2022. Held Velcade  therapy on 09/24/2022 due to development of ocular toxicity. --Switched to Dara/Rev/Dex on 10/02/2022. Last dose on 07/13/2023. --Underwent bone marrow transplant at [facility] on 08/04/2023.  --Last bone marrow biopsy from 11/16/2023 demonstrates a normocellular marrow with no increase in  plasma cells (2%), confirmed by CD138 immunohistochemistry. There is no morphologic evidence of involvement by plasma cell neoplasm  --Recommended maintenance therapy with daratumumab  plus Revlimid  --Plan to resume Xgeva  therapy till November 2025 to complete 2 years of therapy.  PLAN: --Resumed Dara/Revlimid  on 03/10/2024.  --Labs today show WBC 4.5, hemoglobin 13.5, platelets 214, creatinine and LFTs are normal. --Proceed with treatment today without any dose modifications. --RTC in 4 weeks with  labs and follow up before next dose of Darzalex .   #Dental procedures: -- Since patient received his final dose of Xgeva  therapy today, recommend to hold any elective dental procedures for 3 months. In addition, recommend to hold Revlimid  and ASA 7 days prior to procedure.   #Erectile Dysfunction -- Likely secondary to chemotherapy. --prescribed Viagra  50 mg p.o. as needed x 10 pills.  --Patient notes he is not getting the results he would like, encouraged him to double the dose to Viagra  100 mg p.o. to see if that is more effective. -- If ineffective could consider Cialis.  # H/O Leg Discomfort/Restless Leg -- Patient provided with Requip  by the emergency department, discontinued due to poor efficacy. --Evaluated by Dr. Buckley on 07/28/2022. Recommended to short course of steroids so started prednisone  50 mg daily x 7 days. --Discussed etiologies including iron deficiency so iron levels were checked and was normal.   #Pathologic fractures-secondary to MM: --Involving L1, L2 and L3 compression fracture and left humerus fracture.  --Underwent kyphoplasty of L3 fracture on 02/19/2022 --Underwent medullary nailing of left humeral shaft on 02/22/2022 --Underwent kyphoplasty on 06/04/2022.   #Pain Medication -- Per palliative care patient has weaned off MS contin . Currently conitnues on gabapentin  100 mg BID as needed --Under the care of palliative care team.   #Supportive Care -- chemotherapy  education complete -- port placement not required -- zofran  8mg  q8H PRN and compazine  10mg  PO q6H for nausea -- acyclovir  400mg  PO BID for VCZ prophylaxis. Sent refill today   No orders of the defined types were placed in this encounter.   All questions were answered. The patient knows to call the clinic with any problems, questions or concerns.  I have spent a total of 30 minutes minutes of face-to-face and non-face-to-face time, preparing to see the patient,performing a medically appropriate examination, counseling and educating the patient, documenting clinical information in the electronic health record,  and care coordination.  Johnston Police PA-C Dept of Hematology and Oncology [facility] Phone: [phone]

## 2024-04-07 ENCOUNTER — Encounter: Payer: Self-pay | Admitting: Hematology and Oncology

## 2024-04-07 ENCOUNTER — Encounter: Payer: Self-pay | Admitting: Hematology

## 2024-04-07 ENCOUNTER — Encounter: Payer: Self-pay | Admitting: Nurse Practitioner

## 2024-04-07 ENCOUNTER — Other Ambulatory Visit (HOSPITAL_COMMUNITY): Payer: Self-pay

## 2024-04-07 NOTE — Progress Notes (Signed)
 Palliative Medicine [facility]  [phone] [phone]   Name: [name] Date: 04/07/2024 MRN: [id number]  DOB: 1970/11/24  Patient Care Team: Tanda Bleacher, MD as PCP - General (Family Medicine) Court Dorn JINNY, MD as PCP - Cardiology (Cardiology) Shaaron Lamar HERO, MD as Attending Physician (Gastroenterology)   INTERVAL HISTORY: [name] is a 53 y.o. male with oncologic medical history including IgG Kappa Multiple Myeloma, L1, L2, and L3 vertebral compression fraction s/p kyphoplasty(9/27) and IM nailing of left humeral shaft (9/30), currently undergoing VRD therapy.  Palliative ask to see for symptom management.   SOCIAL HISTORY:     reports that he quit smoking about 2 years ago. His smoking use included cigarettes. He started smoking about 12 years ago. He has a 2.5 pack-year smoking history. He has never used smokeless tobacco. He reports current alcohol use. He reports that he does not use drugs.  ADVANCE DIRECTIVES:    CODE STATUS:   PAST MEDICAL HISTORY: Past Medical History:  Diagnosis Date   Allergy    Anemia    Anxiety    Atypical chest pain    Cancer (HCC)    Multiple myeloma with normocytic anemia   Depression    GERD (gastroesophageal reflux disease)    H/O transfusion of platelets    Hepatic cyst    Benign by MRI   Hiatal hernia    History of autologous stem cell transplant (HCC) 07/2023   History of colonic polyps    Hyperlipidemia    Hypertension    Multiple myeloma (HCC)    Pre-diabetes    Rectal bleeding    Restless leg    Tobacco abuse     ALLERGIES:  has no known allergies.  MEDICATIONS:  Current Outpatient Medications  Medication Sig Dispense Refill   acetaminophen  (TYLENOL ) 500 MG tablet Take 1,000 mg by mouth every 8 (eight) hours as needed for mild pain (pain score 1-3) or moderate pain (pain score 4-6). (Patient not taking: Reported on 03/03/2024)     acyclovir  (ZOVIRAX ) 400 MG  tablet Take 1 tablet (400 mg total) by mouth 2 (two) times daily. 60 tablet 0   aspirin  EC 81 MG tablet Take 81 mg by mouth daily. Swallow whole. (Patient not taking: Reported on 03/03/2024)     cetirizine  (ZYRTEC ) 10 MG tablet Take 10 mg by mouth daily as needed for allergies. (Patient not taking: Reported on 03/03/2024)     diazepam  (VALIUM ) 2 MG tablet Take 1 tablet (2 mg total) by mouth every 8 (eight) hours as needed for muscle spasms. (Patient not taking: Reported on 03/03/2024) 5 tablet 0   docusate sodium  (COLACE) 100 MG capsule Take 1 capsule (100 mg total) by mouth 2 (two) times daily as needed for mild constipation. (Patient not taking: Reported on 03/03/2024) 30 capsule 3   DULoxetine  (CYMBALTA ) 20 MG capsule Take 1 capsule by mouth once daily (Patient not taking: Reported on 03/03/2024) 90 capsule 0   gabapentin  (NEURONTIN ) 100 MG capsule Take 1 capsule by mouth twice daily (Patient not taking: Reported on 03/03/2024) 60 capsule 0   lenalidomide  (REVLIMID ) 10 MG capsule Take 1 capsule (10 mg total) by mouth daily. [employer] Auth #  [id number]  Date Obtained 03/28/24 Take one capsule daily for 21 days and then none for 7 days 21 capsule 0   lidocaine  (LIDODERM ) 5 % Place 1 patch onto the skin daily. Remove & Discard patch within 12 hours or as directed  by MD (Patient not taking: Reported on 02/05/2024) 14 patch 0   lisinopril  (ZESTRIL ) 10 MG tablet Take 1 tablet (10 mg total) by mouth daily. 90 tablet 0   lisinopril  (ZESTRIL ) 40 MG tablet Take 1 tablet by mouth once daily (Patient not taking: No sig reported) 90 tablet 0   Multiple Vitamin (MULTIVITAMIN) tablet Take 1 tablet by mouth daily. (Patient not taking: Reported on 03/03/2024)     ondansetron  (ZOFRAN ) 8 MG tablet Take 1 tablet (8 mg total) by mouth every 8 (eight) hours as needed for nausea or vomiting. (Patient not taking: Reported on 03/03/2024) 60 tablet 3   oxyCODONE  (OXY IR/ROXICODONE ) 5 MG immediate release tablet Take 1-2 tablets (5-10 mg  total) by mouth every 6 (six) hours as needed for moderate pain (pain score 4-6). (Patient not taking: Reported on 03/03/2024) 20 tablet 0   pantoprazole  (PROTONIX ) 40 MG tablet Take 1 tablet by mouth twice daily 60 tablet 0   polyethylene glycol powder (GLYCOLAX /MIRALAX ) 17 GM/SCOOP powder Take 1 capful (17 g) with water  by mouth daily. (Patient taking differently: Take 17 g by mouth daily as needed for mild constipation or moderate constipation.) 238 g 0   rosuvastatin  (CRESTOR ) 10 MG tablet Take 1 tablet by mouth once daily (Patient not taking: Reported on 02/05/2024) 90 tablet 0   sildenafil  (VIAGRA ) 50 MG tablet Take 1 tablet (50 mg total) by mouth daily as needed for erectile dysfunction. 10 tablet 0   No current facility-administered medications for this visit.    VITAL SIGNS: There were no vitals taken for this visit. There were no vitals filed for this visit.  Estimated body mass index is 35.7 kg/m as calculated from the following:   Height as of 03/03/24: 5' 9 (1.753 m).   Weight as of an earlier encounter on 04/06/24: 241 lb 12 oz (109.7 kg).   PERFORMANCE STATUS (ECOG) : 1 - Symptomatic but completely ambulatory  Assessment NAD, ambulatory RRR Normal breathing pattern AAO x4  IMPRESSION: Discussed the use of AI scribe software for clinical note transcription with the patient, who gave verbal consent to proceed.  History of Present Illness He is a 53 year old male who was seen during infusion for follow-up. No acute distress. He is doing well overall. Denies concerns for nausea, vomiting or diarrhea. Occasional fatigue however remains active. His energy levels fluctuate, with some days being better than others.   His weight is stable at 241lbs. He consumes two meals a day, breakfast and dinner, without any specific dietary restrictions or changes in appetite. No gastrointestinal issues such as constipation or diarrhea are present.   No symptom management needs at this time.  Overall Igor is much improved.  Appreciative of where he is in his health journey.  He knows to contact our office as needed with any concerns.  PLAN:  No symptom management needs at this time.  I will plan to see patient back in 6-8 weeks in collaboration with other oncology appointments.  Patient knows to contact office sooner if needed.  Patient expressed understanding and was in agreement with this plan. He also understands that He can call the clinic at any time with any questions, concerns, or complaints.    Any controlled substances utilized were prescribed in the context of palliative care. PDMP has been reviewed.    Visit consisted of counseling and education dealing with the complex and emotionally intense issues of symptom management and palliative care in the setting of serious and potentially  life-threatening illness.  Levon Borer, AGPCNP-BC  Palliative Medicine Team/[facility]

## 2024-04-08 ENCOUNTER — Other Ambulatory Visit (HOSPITAL_COMMUNITY): Payer: Self-pay

## 2024-04-19 ENCOUNTER — Other Ambulatory Visit: Payer: Self-pay | Admitting: Hematology and Oncology

## 2024-04-19 NOTE — Telephone Encounter (Signed)
 OV note -  maintenance therapy with daratumumab  plus Revlimid 

## 2024-04-26 ENCOUNTER — Other Ambulatory Visit: Payer: Self-pay | Admitting: Nurse Practitioner

## 2024-04-26 DIAGNOSIS — C9000 Multiple myeloma not having achieved remission: Secondary | ICD-10-CM

## 2024-04-26 DIAGNOSIS — Z515 Encounter for palliative care: Secondary | ICD-10-CM

## 2024-04-26 DIAGNOSIS — K30 Functional dyspepsia: Secondary | ICD-10-CM

## 2024-04-28 ENCOUNTER — Ambulatory Visit: Admitting: Family Medicine

## 2024-05-01 ENCOUNTER — Other Ambulatory Visit: Payer: Self-pay | Admitting: Nurse Practitioner

## 2024-05-01 DIAGNOSIS — K30 Functional dyspepsia: Secondary | ICD-10-CM

## 2024-05-01 DIAGNOSIS — C9000 Multiple myeloma not having achieved remission: Secondary | ICD-10-CM

## 2024-05-01 DIAGNOSIS — Z515 Encounter for palliative care: Secondary | ICD-10-CM

## 2024-05-04 ENCOUNTER — Inpatient Hospital Stay: Attending: Physician Assistant | Admitting: Physician Assistant

## 2024-05-04 ENCOUNTER — Inpatient Hospital Stay

## 2024-05-04 VITALS — BP 148/93 | HR 72 | Temp 97.5°F | Resp 18 | Ht 69.0 in | Wt 251.3 lb

## 2024-05-04 DIAGNOSIS — K219 Gastro-esophageal reflux disease without esophagitis: Secondary | ICD-10-CM | POA: Diagnosis not present

## 2024-05-04 DIAGNOSIS — C9000 Multiple myeloma not having achieved remission: Secondary | ICD-10-CM

## 2024-05-04 DIAGNOSIS — E785 Hyperlipidemia, unspecified: Secondary | ICD-10-CM | POA: Diagnosis not present

## 2024-05-04 DIAGNOSIS — Z9484 Stem cells transplant status: Secondary | ICD-10-CM | POA: Diagnosis not present

## 2024-05-04 DIAGNOSIS — Z7982 Long term (current) use of aspirin: Secondary | ICD-10-CM | POA: Diagnosis not present

## 2024-05-04 DIAGNOSIS — Z860101 Personal history of adenomatous and serrated colon polyps: Secondary | ICD-10-CM | POA: Insufficient documentation

## 2024-05-04 DIAGNOSIS — Z800 Family history of malignant neoplasm of digestive organs: Secondary | ICD-10-CM | POA: Diagnosis not present

## 2024-05-04 DIAGNOSIS — I10 Essential (primary) hypertension: Secondary | ICD-10-CM | POA: Diagnosis not present

## 2024-05-04 DIAGNOSIS — N529 Male erectile dysfunction, unspecified: Secondary | ICD-10-CM | POA: Diagnosis not present

## 2024-05-04 DIAGNOSIS — Z7189 Other specified counseling: Secondary | ICD-10-CM | POA: Diagnosis not present

## 2024-05-04 DIAGNOSIS — R0982 Postnasal drip: Secondary | ICD-10-CM | POA: Insufficient documentation

## 2024-05-04 DIAGNOSIS — Z79899 Other long term (current) drug therapy: Secondary | ICD-10-CM | POA: Diagnosis not present

## 2024-05-04 DIAGNOSIS — K449 Diaphragmatic hernia without obstruction or gangrene: Secondary | ICD-10-CM | POA: Diagnosis not present

## 2024-05-04 DIAGNOSIS — Z5112 Encounter for antineoplastic immunotherapy: Secondary | ICD-10-CM | POA: Diagnosis present

## 2024-05-04 DIAGNOSIS — M549 Dorsalgia, unspecified: Secondary | ICD-10-CM | POA: Insufficient documentation

## 2024-05-04 DIAGNOSIS — R110 Nausea: Secondary | ICD-10-CM | POA: Diagnosis not present

## 2024-05-04 DIAGNOSIS — M8440XA Pathological fracture, unspecified site, initial encounter for fracture: Secondary | ICD-10-CM | POA: Insufficient documentation

## 2024-05-04 DIAGNOSIS — Z87891 Personal history of nicotine dependence: Secondary | ICD-10-CM | POA: Insufficient documentation

## 2024-05-04 DIAGNOSIS — Z8042 Family history of malignant neoplasm of prostate: Secondary | ICD-10-CM | POA: Insufficient documentation

## 2024-05-04 DIAGNOSIS — Z79624 Long term (current) use of inhibitors of nucleotide synthesis: Secondary | ICD-10-CM | POA: Diagnosis not present

## 2024-05-04 DIAGNOSIS — G2581 Restless legs syndrome: Secondary | ICD-10-CM | POA: Insufficient documentation

## 2024-05-04 DIAGNOSIS — K7689 Other specified diseases of liver: Secondary | ICD-10-CM | POA: Insufficient documentation

## 2024-05-04 LAB — CMP (CANCER CENTER ONLY)
ALT: 17 U/L (ref 0–44)
AST: 21 U/L (ref 15–41)
Albumin: 4.2 g/dL (ref 3.5–5.0)
Alkaline Phosphatase: 82 U/L (ref 38–126)
Anion gap: 11 (ref 5–15)
BUN: 7 mg/dL (ref 6–20)
CO2: 25 mmol/L (ref 22–32)
Calcium: 9 mg/dL (ref 8.9–10.3)
Chloride: 103 mmol/L (ref 98–111)
Creatinine: 1.16 mg/dL (ref 0.61–1.24)
GFR, Estimated: >60 mL/min (ref >60)
Glucose, Bld: 114 mg/dL — ABNORMAL HIGH (ref 70–99)
Potassium: 4 mmol/L (ref 3.5–5.1)
Sodium: 138 mmol/L (ref 135–145)
Total Bilirubin: 0.4 mg/dL (ref 0.0–1.2)
Total Protein: 6.6 g/dL (ref 6.5–8.1)

## 2024-05-04 LAB — CBC WITH DIFFERENTIAL (CANCER CENTER ONLY)
Abs Immature Granulocytes: 0.02 K/uL (ref 0.00–0.07)
Basophils Absolute: 0 K/uL (ref 0.0–0.1)
Basophils Relative: 1 %
Eosinophils Absolute: 0.2 K/uL (ref 0.0–0.5)
Eosinophils Relative: 4 %
HCT: 40.9 % (ref 39.0–52.0)
Hemoglobin: 13.3 g/dL (ref 13.0–17.0)
Immature Granulocytes: 0 %
Lymphocytes Relative: 48 %
Lymphs Abs: 2.4 K/uL (ref 0.7–4.0)
MCH: 27.8 pg (ref 26.0–34.0)
MCHC: 32.5 g/dL (ref 30.0–36.0)
MCV: 85.4 fL (ref 80.0–100.0)
Monocytes Absolute: 0.5 K/uL (ref 0.1–1.0)
Monocytes Relative: 10 %
Neutro Abs: 1.9 K/uL (ref 1.7–7.7)
Neutrophils Relative %: 37 %
Platelet Count: 190 K/uL (ref 150–400)
RBC: 4.79 MIL/uL (ref 4.22–5.81)
RDW: 13.6 % (ref 11.5–15.5)
WBC Count: 5.1 K/uL (ref 4.0–10.5)
nRBC: 0 % (ref 0.0–0.2)

## 2024-05-04 MED ORDER — DIPHENHYDRAMINE HCL 25 MG PO CAPS
50.0000 mg | ORAL_CAPSULE | Freq: Once | ORAL | Status: AC
  Administered 2024-05-04: 50 mg via ORAL
  Filled 2024-05-04: qty 2

## 2024-05-04 MED ORDER — ACETAMINOPHEN 325 MG PO TABS
650.0000 mg | ORAL_TABLET | Freq: Once | ORAL | Status: AC
  Administered 2024-05-04: 650 mg via ORAL
  Filled 2024-05-04: qty 2

## 2024-05-04 MED ORDER — FAMOTIDINE 20 MG PO TABS
20.0000 mg | ORAL_TABLET | Freq: Once | ORAL | Status: AC
  Administered 2024-05-04: 20 mg via ORAL
  Filled 2024-05-04: qty 1

## 2024-05-04 MED ORDER — DARATUMUMAB-HYALURONIDASE-FIHJ 1800-30000 MG-UT/15ML ~~LOC~~ SOLN
1800.0000 mg | Freq: Once | SUBCUTANEOUS | Status: AC
  Administered 2024-05-04: 1800 mg via SUBCUTANEOUS
  Filled 2024-05-04: qty 15

## 2024-05-04 MED ORDER — DEXAMETHASONE 6 MG PO TABS
40.0000 mg | ORAL_TABLET | Freq: Once | ORAL | Status: AC
  Administered 2024-05-04: 40 mg via ORAL
  Filled 2024-05-04: qty 1

## 2024-05-04 NOTE — Progress Notes (Signed)
 [facility] [phone]   [phone]  PROGRESS NOTE  Patient Care Team: Christopher Bleacher, MD as PCP - General (Family Medicine) Christopher Dorn PARAS, MD as PCP - Cardiology (Cardiology) Christopher Lamar HERO, MD as Attending Physician (Gastroenterology)  Hematological/Oncological History # IgG Kappa Multiple Myeloma, 1p32/1q21.  01/04/2022: MRI lumbar spine shows diffuse heterogeneous marrow signal with heterogeneous enhancement throughout the thoracolumbar spine.  Additionally, there is subacute-chronic L3 vertebral body compression fraction. 01/14/2022: establish care with Dr. Federico  02/10/2022: bone marrow biopsy shows range of plasma cell involvement from 50% on the biopsy to 60% on aspirate smear slides and close to 90% on the clot section for an overall percentage estimated at approximately 80% overall. 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  10/02/2022: Cycle 1 Day 1 of Dara/Rev/Dex 11/03/2022: Cycle 2 Day 1 of Dara/Rev/Dex 11/30/2022:  Cycle 3 Day 1 of Dara/Rev/Dex 12/29/2022: Cycle 4 Day 1 of Dara/Rev/Dex 01/26/2023: Cycle 5 Day 1 of Dara/Rev/Dex 02/23/2023: Cycle 6 Day 1 of Dara/Rev/Dex 03/23/2023: Cycle 7 Day 1 of Dara/Rev/Dex 04/20/2023: Cycle 8 Day 1 of Dara/Rev/Dex 05/19/2023: Cycle 9 Day 1 of Dara/Rev/Dex 1/11-1/17/2025: mobilization and stem cell collection  06/15/2023: Cycle 10 Day 1 of Dara/Rev/Dex 07/14/2023: Cycle 11 Day 1 of  Dara/Rev/Dex 08/04/2023: Underwent autologous stem cell transplant at [facility] on 08/04/2023.  03/10/2024: Started Dara/Revlimid  therapy  Interval History:  [name] 53 y.o. male for continued management of IgG kappa multiple myeloma. The patient's last visit was on 04/06/2024 and in the interim, he denies any changes to his health.   On exam today [name] reports he is doing well and tolerating treatment without any new or concerning symptoms. He is undergoing physical therapy twice a week. He has stable back pain that is mainly present in the evening. He stopped taking gabapentin . He reports occasional nausea but contributes this to having post nasal drip. He denies any vomiting episodes, abdominal pain or bowel habit changes.  He denies easy bruising or signs of active bleeding.  He denies fevers, chills, night sweats, shortness of breath, chest pain or cough.  He has no other complaints.  Rest of 10 point ROS as below.  MEDICAL HISTORY:  Past Medical History:  Diagnosis Date   Allergy    Anemia    Anxiety    Atypical chest pain    Cancer (HCC)    Multiple myeloma with normocytic anemia   Depression    GERD (gastroesophageal reflux disease)    H/O transfusion of platelets    Hepatic cyst    Benign by MRI   Hiatal hernia    History of autologous stem cell transplant (HCC) 07/2023   History of colonic polyps    Hyperlipidemia    Hypertension    Multiple myeloma (HCC)    Pre-diabetes    Rectal bleeding    Restless leg    Tobacco abuse     SURGICAL HISTORY: Past Surgical History:  Procedure Laterality Date   COLONOSCOPY  01/10/2009     RMR: Normal rectum, normal colon; repeat in 2015 due to FH of colon cancer   COLONOSCOPY N/A 01/09/2014   MFM:Floupeoz colonic polyps-removed as described   COLONOSCOPY N/A 09/18/2016   Procedure: COLONOSCOPY;  Surgeon: Lamar CHRISTELLA Hollingshead, MD;  Location: [facility];  Service: Endoscopy;  Laterality: N/A;  730    ESOPHAGOGASTRODUODENOSCOPY   10/04/2007   RMR: Distal esophageal erosions consistent with erosive reflux esophagitis, patulous gastroesophageal junction status post passage of a  Maloney dilator, 56 French.  Otherwise, unremarkable esophagus.  Hiatal hernia.  Otherwise normal stomach.  Bulbar erosion   ESOPHAGOGASTRODUODENOSCOPY N/A 01/09/2014   Erosive reflux esophagitis. Small hiatal hernia   HUMERUS IM NAIL Left 02/22/2022   Procedure: INTRAMEDULLARY (IM) NAIL HUMERAL;  Surgeon: Christopher Selinda Dover, MD;  Location: [facility] OR;  Service: Orthopedics;  Laterality: Left;   I & D EXTREMITY Left 07/26/2018   Procedure: IRRIGATION AND DEBRIDEMENT EXTREMITY;  Surgeon: Christopher Dess, MD;  Location: [facility] OR;  Service: Plastics;  Laterality: Left;   IR BONE TUMOR(S)RF ABLATION  02/05/2022   IR BONE TUMOR(S)RF ABLATION  02/24/2022   IR KYPHO EA ADDL LEVEL THORACIC OR LUMBAR  02/19/2022   IR KYPHO LUMBAR INC FX REDUCE BONE BX UNI/BIL CANNULATION INC/IMAGING  02/05/2022   IR KYPHO LUMBAR INC FX REDUCE BONE BX UNI/BIL CANNULATION INC/IMAGING  02/19/2022   LAPAROSCOPIC APPENDECTOMY N/A 02/09/2024   Procedure: APPENDECTOMY, LAPAROSCOPIC;  Surgeon: Christopher Boas, MD;  Location: [facility] ORS;  Service: General;  Laterality: N/A;   PERCUTANEOUS PINNING Left 07/26/2018   Procedure: PERCUTANEOUS PINNING EXTREMITY;  Surgeon: Christopher Dess, MD;  Location: [facility] OR;  Service: Plastics;  Laterality: Left;    SOCIAL HISTORY: Social History   Socioeconomic History   Marital status: Divorced    Spouse name: Not on file   Number of children: 2   Years of education: Not on file   Highest education level: Not on file  Occupational History   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[employer]    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[employer]  Tobacco Use   Smoking status: Former    Current packs/day: 0.00    Average packs/day: 0.3 packs/day for 10.0 years (2.5 ttl pk-yrs)    Types: Cigarettes    Start date: 12/25/2011    Quit date: 12/24/2021    Years since quitting:  2.3   Smokeless tobacco: Never  Vaping Use   Vaping status: Never Used  Substance and Sexual Activity   Alcohol use: Yes    Alcohol/week: 0.0 standard drinks of alcohol    Comment: occasional/wine on the weekends   Drug use: No   Sexual activity: Not on file  Other Topics Concern   Not on file  Social History Narrative   Right handed   Lives alone one story home   Social Drivers of Health   Financial Resource Strain: Low Risk (02/03/2024)   Received from [facility]   Overall Financial Resource Strain (CARDIA)    How hard is it for you to pay for the very basics like food, housing, medical care, and heating?: Not very hard  Food Insecurity: Food Insecurity Present (02/03/2024)   Received from [facility]   Hunger Vital Sign    Within the past 12 months, you worried that your food would run out before you got the money to buy more.: Sometimes true    Within the past 12 months, the food you bought  just didn't last and you didn't have money to get more.: Sometimes true  Transportation Needs: Unmet Transportation Needs (02/03/2024)   Received from [facility] - Transportation    In the past 12 months, has lack of transportation kept you from medical appointments or from getting medications?: Yes    In the past 12 months, has lack of transportation kept you from meetings, work, or from getting things needed for daily living?: Yes  Physical Activity: Inactive (02/13/2023)   Exercise Vital Sign    Days of Exercise per Week: 0 days    Minutes of Exercise per Session: 0 min  Stress: No Stress Concern Present (02/13/2023)   [employer] of Occupational Health - Occupational Stress Questionnaire    Feeling of Stress : Not at all  Social Connections: Unknown (02/13/2023)   Social Connection and Isolation Panel    Frequency of Communication with Friends and Family: More than three times a week    Frequency of Social Gatherings with Friends and Family: More than three times  a week    Attends Religious Services: More than 4 times per year    Active Member of [employer] or Organizations: No    Attends Banker Meetings: Never    Marital Status: Not on file  Intimate Partner Violence: Not At Risk (07/17/2022)   Humiliation, Afraid, Rape, and Kick questionnaire    Fear of Current or Ex-Partner: No    Emotionally Abused: No    Physically Abused: No    Sexually Abused: No    FAMILY HISTORY: Family History  Problem Relation Age of Onset   Heart disease Mother    Hypertension Mother    Diabetes Father    Hypertension Father    Colon cancer Sister        passed away from colon cancer, in her 72s   Heart attack Maternal Uncle    Prostate cancer Maternal Uncle    Diabetes Other    Pancreatic cancer Neg Hx    Rectal cancer Neg Hx    Esophageal cancer Neg Hx    Stomach cancer Neg Hx     ALLERGIES:  has no known allergies.  MEDICATIONS:  Current Outpatient Medications  Medication Sig Dispense Refill   acyclovir  (ZOVIRAX ) 400 MG tablet Take 1 tablet (400 mg total) by mouth 2 (two) times daily. 60 tablet 0   aspirin  EC 81 MG tablet Take 81 mg by mouth daily. Swallow whole.     lenalidomide  (REVLIMID ) 10 MG capsule TAKE 1 CAPSULE BY MOUTH 1 TIME A DAY FOR 21 DAYS ON THEN 7 DAYS OFF 21 capsule 0   lisinopril  (ZESTRIL ) 10 MG tablet Take 1 tablet (10 mg total) by mouth daily. 90 tablet 0   ondansetron  (ZOFRAN ) 8 MG tablet Take 1 tablet (8 mg total) by mouth every 8 (eight) hours as needed for nausea or vomiting. 60 tablet 3   pantoprazole  (PROTONIX ) 40 MG tablet TAKE 1 TABLET BY MOUTH TWICE DAILY 60 tablet 6   acetaminophen  (TYLENOL ) 500 MG tablet Take 1,000 mg by mouth every 8 (eight) hours as needed for mild pain (pain score 1-3) or moderate pain (pain score 4-6). (Patient not taking: Reported on 03/03/2024)     cetirizine  (ZYRTEC ) 10 MG tablet Take 10 mg by mouth daily as needed for allergies. (Patient not taking: Reported on 03/03/2024)     diazepam   (VALIUM ) 2 MG tablet Take 1 tablet (2 mg total) by mouth every 8 (eight) hours as  needed for muscle spasms. (Patient not taking: Reported on 03/03/2024) 5 tablet 0   docusate sodium  (COLACE) 100 MG capsule Take 1 capsule (100 mg total) by mouth 2 (two) times daily as needed for mild constipation. (Patient not taking: Reported on 03/03/2024) 30 capsule 3   DULoxetine  (CYMBALTA ) 20 MG capsule Take 1 capsule by mouth once daily (Patient not taking: Reported on 03/03/2024) 90 capsule 0   gabapentin  (NEURONTIN ) 100 MG capsule Take 1 capsule by mouth twice daily (Patient not taking: Reported on 05/04/2024) 60 capsule 0   lidocaine  (LIDODERM ) 5 % Place 1 patch onto the skin daily. Remove & Discard patch within 12 hours or as directed by MD (Patient not taking: Reported on 02/05/2024) 14 patch 0   lisinopril  (ZESTRIL ) 40 MG tablet Take 1 tablet by mouth once daily (Patient not taking: Reported on 05/04/2024) 90 tablet 0   Multiple Vitamin (MULTIVITAMIN) tablet Take 1 tablet by mouth daily. (Patient not taking: Reported on 05/04/2024)     oxyCODONE  (OXY IR/ROXICODONE ) 5 MG immediate release tablet Take 1-2 tablets (5-10 mg total) by mouth every 6 (six) hours as needed for moderate pain (pain score 4-6). (Patient not taking: Reported on 03/03/2024) 20 tablet 0   polyethylene glycol powder (GLYCOLAX /MIRALAX ) 17 GM/SCOOP powder Take 1 capful (17 g) with water  by mouth daily. (Patient taking differently: Take 17 g by mouth daily as needed for mild constipation or moderate constipation.) 238 g 0   rosuvastatin  (CRESTOR ) 10 MG tablet Take 1 tablet by mouth once daily (Patient not taking: Reported on 02/05/2024) 90 tablet 0   sildenafil  (VIAGRA ) 50 MG tablet Take 1 tablet (50 mg total) by mouth daily as needed for erectile dysfunction. 10 tablet 0   No current facility-administered medications for this visit.   Facility-Administered Medications Ordered in Other Visits  Medication Dose Route Frequency Provider Last Rate  Last Admin   daratumumab -hyaluronidase -fihj (DARZALEX  FASPRO) 1800-30000 MG-UT/15ML chemo SQ injection 1,800 mg  1,800 mg Subcutaneous Once Christopher Norleen ONEIDA MADISON, MD        REVIEW OF SYSTEMS:   Constitutional: ( - ) fevers, ( - )  chills , ( - ) night sweats Eyes: ( - ) blurriness of vision, ( - ) double vision, ( - ) watery eyes Ears, nose, mouth, throat, and face: ( - ) mucositis, ( - ) sore throat Respiratory: ( - ) cough, ( - ) dyspnea, ( - ) wheezes Cardiovascular: ( - ) palpitation, ( - ) chest discomfort, ( - ) lower extremity swelling Gastrointestinal:  ( - ) nausea, ( -) heartburn, ( - ) change in bowel habits Skin: ( - ) abnormal skin rashes Lymphatics: ( - ) new lymphadenopathy, ( - ) easy bruising Neurological: ( - ) numbness, ( - ) tingling, ( - ) new weaknesses Behavioral/Psych: ( - ) mood change, ( - ) new changes  All other systems were reviewed with the patient and are negative.  PHYSICAL EXAMINATION: ECOG PERFORMANCE STATUS: 1 - Symptomatic but completely ambulatory  Vitals:   05/04/24 1146  BP: (!) 148/93  Pulse: 72  Resp: 18  Temp: (!) 97.5 F (36.4 C)  SpO2: 98%     Filed Weights   05/04/24 1146  Weight: 251 lb 4.8 oz (114 kg)     GENERAL: Well-appearing young African-American male, alert, no distress and comfortable SKIN: skin color, texture, turgor are normal, no rashes or significant lesions EYES: conjunctiva are pink and non-injected, sclera clear. LUNGS: clear to auscultation and  percussion with normal breathing effort HEART: regular rate & rhythm and no murmurs and no lower extremity edema Musculoskeletal: no cyanosis of digits and no clubbing  PSYCH: alert & oriented x 3, fluent speech NEURO: no focal motor/sensory deficits  LABORATORY DATA:  I have reviewed the data as listed    Latest Ref Rng & Units 05/04/2024   11:02 AM 04/06/2024    9:14 AM 03/10/2024    9:06 AM  CBC  WBC 4.0 - 10.5 K/uL 5.1  4.5  8.1   Hemoglobin 13.0 - 17.0 g/dL  86.6  86.4  86.5   Hematocrit 39.0 - 52.0 % 40.9  41.0  40.1   Platelets 150 - 400 K/uL 190  214  209        Latest Ref Rng & Units 05/04/2024   11:02 AM 04/06/2024    9:14 AM 03/10/2024    9:06 AM  CMP  Glucose 70 - 99 mg/dL 885  94  886   BUN 6 - 20 mg/dL 7  11  16    Creatinine 0.61 - 1.24 mg/dL 8.83  8.89  8.94   Sodium 135 - 145 mmol/L 138  137  137   Potassium 3.5 - 5.1 mmol/L 4.0  4.1  3.4   Chloride 98 - 111 mmol/L 103  103  104   CO2 22 - 32 mmol/L 25  27  27    Calcium  8.9 - 10.3 mg/dL 9.0  9.1  9.6   Total Protein 6.5 - 8.1 g/dL 6.6  7.0  7.0   Total Bilirubin 0.0 - 1.2 mg/dL 0.4  0.4  0.4   Alkaline Phos 38 - 126 U/L 82  91  81   AST 15 - 41 U/L 21  17  15    ALT 0 - 44 U/L 17  13  15      Lab Results  Component Value Date   MPROTEIN 0.3 (H) 03/10/2024   MPROTEIN 0.2 (H) 03/02/2024   MPROTEIN Not Observed 01/27/2024   Lab Results  Component Value Date   KPAFRELGTCHN 11.4 03/10/2024   KPAFRELGTCHN 14.3 03/02/2024   KPAFRELGTCHN 6.0 01/27/2024   LAMBDASER 3.9 (L) 03/10/2024   LAMBDASER 5.6 (L) 03/02/2024   LAMBDASER 2.9 (L) 01/27/2024   KAPLAMBRATIO 2.92 (H) 03/10/2024   KAPLAMBRATIO 2.55 (H) 03/02/2024   KAPLAMBRATIO 2.07 (H) 01/27/2024   RADIOGRAPHIC STUDIES: No results found.  ASSESSMENT & PLAN ETHELBERT THAIN 53 y.o. male with medical history significant for IgG kappa multiple myeloma who presents for a follow up visit.  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 -- Recommend VRD chemotherapy with intention of proceeding to transplant. -- Labs at each visit to include CBC, CMP, LDH with monthly restaging labs SPEP and serum free light chains -- Started VRD therapy on 02/26/2022. Held Velcade  therapy on 09/24/2022 due to development of ocular toxicity. --Switched to Dara/Rev/Dex on 10/02/2022. Last dose on  07/13/2023. --Underwent bone marrow transplant at [facility] on 08/04/2023.  --Last bone marrow biopsy from 11/16/2023 demonstrates a normocellular marrow with no increase in plasma cells (2%), confirmed by CD138 immunohistochemistry. There is no morphologic evidence of involvement by plasma cell neoplasm  --Recommended maintenance therapy with daratumumab  plus Revlimid  --Plan to resume Xgeva  therapy till November 2025 to complete 2 years of therapy.  PLAN: --Resumed Dara/Revlimid  on 03/10/2024.  --Labs today show WBC 5.1, Hgb 13.3,  Plt 190, creatinine and LFTs are normal. --Most recent myeloma labs from 03/10/2024 showed M protein 0.3.Kappa light chain 11.4, lambda light chain 3.9, ratio 2.92. Dara-Specific IFE detected IgG monoclonal protein with kappa light chain specificity.  --We will repeat myeloma labs today and fax over prior labs to [facility] team.  --Proceed with treatment today without any dose modifications. --RTC in 4 weeks with labs and follow up before next dose of Darzalex .   #Erectile Dysfunction -- Likely secondary to chemotherapy. --prescribed Viagra  50 mg p.o. as needed x 10 pills.  --Patient notes he is not getting the results he would like, encouraged him to double the dose to Viagra  100 mg p.o. to see if that is more effective. -- If ineffective could consider Cialis.  # H/O Leg Discomfort/Restless Leg -- Patient provided with Requip  by the emergency department, discontinued due to poor efficacy. --Evaluated by [name] on 07/28/2022. Recommended to short course of steroids so started prednisone  50 mg daily x 7 days. --Discussed etiologies including iron deficiency so iron levels were checked and was normal.   #Pathologic fractures-secondary to MM: --Involving L1, L2 and L3 compression fracture and left humerus fracture.  --Underwent kyphoplasty of L3 fracture on 02/19/2022 --Underwent medullary nailing of left humeral shaft on 02/22/2022 --Underwent kyphoplasty on  06/04/2022.   #Pain Medication -- Per palliative care patient has weaned off MS contin . Currently conitnues on gabapentin  100 mg BID as needed --Under the care of palliative care team.   #Supportive Care -- chemotherapy education complete -- port placement not required -- zofran  8mg  q8H PRN and compazine  10mg  PO q6H for nausea -- acyclovir  400mg  PO BID for VCZ prophylaxis. Sent refill today   No orders of the defined types were placed in this encounter.   All questions were answered. The patient knows to call the clinic with any problems, questions or concerns.  I have spent a total of 30 minutes minutes of face-to-face and non-face-to-face time, preparing to see the patient,performing a medically appropriate examination, counseling and educating the patient, documenting clinical information in the electronic health record,  and care coordination.  Johnston Police PA-C Dept of Hematology and Oncology [facility] Phone: [phone]

## 2024-05-04 NOTE — Patient Instructions (Signed)

## 2024-05-05 LAB — KAPPA/LAMBDA LIGHT CHAINS
Kappa free light chain: 6.1 mg/L (ref 3.3–19.4)
Kappa, lambda light chain ratio: 1.91 — ABNORMAL HIGH (ref 0.26–1.65)
Lambda free light chains: 3.2 mg/L — ABNORMAL LOW (ref 5.7–26.3)

## 2024-05-06 LAB — MULTIPLE MYELOMA PANEL, SERUM
Albumin SerPl Elph-Mcnc: 3.4 g/dL (ref 2.9–4.4)
Albumin/Glob SerPl: 1.4 (ref 0.7–1.7)
Alpha 1: 0.3 g/dL (ref 0.0–0.4)
Alpha2 Glob SerPl Elph-Mcnc: 0.9 g/dL (ref 0.4–1.0)
B-Globulin SerPl Elph-Mcnc: 0.9 g/dL (ref 0.7–1.3)
Gamma Glob SerPl Elph-Mcnc: 0.5 g/dL (ref 0.4–1.8)
Globulin, Total: 2.6 g/dL (ref 2.2–3.9)
IgA: 17 mg/dL — ABNORMAL LOW (ref 90–386)
IgG (Immunoglobin G), Serum: 597 mg/dL — ABNORMAL LOW (ref 603–1613)
IgM (Immunoglobulin M), Srm: 25 mg/dL (ref 20–172)
Total Protein ELP: 6 g/dL (ref 6.0–8.5)

## 2024-05-10 ENCOUNTER — Ambulatory Visit: Admitting: Family Medicine

## 2024-05-10 ENCOUNTER — Telehealth: Payer: Self-pay

## 2024-05-10 NOTE — Telephone Encounter (Signed)
 Returned TC to  pt who stated he had pain on his (L) abdominal side x 2 yesterday and 1 x today. Pain described as like a cramp.  Not sharp  Denies nausea; vomiting, diarrhea, fever. He expressed he would like to be seen.  Per Dr Federico, request sent to scheduling for appointment with symptom management clinic tomorrow. Updated pt that a request for appt has been sent .  Pt verbalized understanding.

## 2024-05-11 NOTE — Telephone Encounter (Signed)
 TCT patient to follow up with his left sided abd pain. Spoke with him. He states it comes and goes. Asked pt if he had any problems with constipation. He states he has not had a good BM in 3-4 days. Advised to try Mag Citrate-instructed to take 1/2 bottle initially, wait 3-4 hours. If no results take the other half. Once he is cleaned out, advised to start daily miralax . Advised to call if no relief. Pt voiced understanding.

## 2024-05-16 LAB — IFE, DARA-SPECIFIC, SERUM
IgA: 17 mg/dL — ABNORMAL LOW (ref 90–386)
IgG (Immunoglobin G), Serum: 593 mg/dL — ABNORMAL LOW (ref 603–1613)
IgM (Immunoglobulin M), Srm: 23 mg/dL (ref 20–172)

## 2024-05-17 ENCOUNTER — Other Ambulatory Visit: Payer: Self-pay | Admitting: Hematology and Oncology

## 2024-05-17 ENCOUNTER — Other Ambulatory Visit: Payer: Self-pay

## 2024-05-17 MED ORDER — LENALIDOMIDE 10 MG PO CAPS: 10.0000 mg | ORAL_CAPSULE | Freq: Every day | ORAL | 0 refills | Status: DC

## 2024-05-23 ENCOUNTER — Telehealth: Payer: Self-pay

## 2024-05-23 NOTE — Telephone Encounter (Signed)
 Returned call to pt regarding cough he has experienced since Friday.  He declared no fever, no chills, no shortness of breath. Reported clear mucous when coughing. Encouraged him to continue with over the counter cough medicine such as Robutussin cough syrup and cough drops. Advised to call if he develops fever, shortness of breath, pain with breathing. Pt verbalized understanding.

## 2024-05-30 NOTE — Progress Notes (Signed)
 Rescheduled

## 2024-06-01 ENCOUNTER — Inpatient Hospital Stay: Admitting: Hematology and Oncology

## 2024-06-01 ENCOUNTER — Inpatient Hospital Stay

## 2024-06-01 ENCOUNTER — Inpatient Hospital Stay: Admitting: Nurse Practitioner

## 2024-06-01 DIAGNOSIS — M792 Neuralgia and neuritis, unspecified: Secondary | ICD-10-CM

## 2024-06-01 DIAGNOSIS — C9001 Multiple myeloma in remission: Secondary | ICD-10-CM

## 2024-06-03 ENCOUNTER — Inpatient Hospital Stay (HOSPITAL_BASED_OUTPATIENT_CLINIC_OR_DEPARTMENT_OTHER): Admitting: Hematology and Oncology

## 2024-06-03 ENCOUNTER — Inpatient Hospital Stay: Attending: Physician Assistant

## 2024-06-03 ENCOUNTER — Inpatient Hospital Stay

## 2024-06-03 VITALS — BP 133/84 | HR 72 | Temp 98.2°F | Resp 16

## 2024-06-03 VITALS — BP 149/93 | HR 82 | Temp 97.9°F | Resp 18 | Ht 69.0 in | Wt 244.0 lb

## 2024-06-03 DIAGNOSIS — Z515 Encounter for palliative care: Secondary | ICD-10-CM

## 2024-06-03 DIAGNOSIS — M8458XA Pathological fracture in neoplastic disease, other specified site, initial encounter for fracture: Secondary | ICD-10-CM | POA: Diagnosis not present

## 2024-06-03 DIAGNOSIS — Z5112 Encounter for antineoplastic immunotherapy: Secondary | ICD-10-CM | POA: Diagnosis present

## 2024-06-03 DIAGNOSIS — N529 Male erectile dysfunction, unspecified: Secondary | ICD-10-CM | POA: Insufficient documentation

## 2024-06-03 DIAGNOSIS — G893 Neoplasm related pain (acute) (chronic): Secondary | ICD-10-CM | POA: Diagnosis not present

## 2024-06-03 DIAGNOSIS — C9001 Multiple myeloma in remission: Secondary | ICD-10-CM

## 2024-06-03 DIAGNOSIS — C9000 Multiple myeloma not having achieved remission: Secondary | ICD-10-CM

## 2024-06-03 DIAGNOSIS — Z7189 Other specified counseling: Secondary | ICD-10-CM

## 2024-06-03 LAB — CBC WITH DIFFERENTIAL (CANCER CENTER ONLY)
Abs Immature Granulocytes: 0.02 K/uL (ref 0.00–0.07)
Basophils Absolute: 0 K/uL (ref 0.0–0.1)
Basophils Relative: 1 %
Eosinophils Absolute: 0.2 K/uL (ref 0.0–0.5)
Eosinophils Relative: 4 %
HCT: 39.4 % (ref 39.0–52.0)
Hemoglobin: 13 g/dL (ref 13.0–17.0)
Immature Granulocytes: 0 %
Lymphocytes Relative: 45 %
Lymphs Abs: 2.5 K/uL (ref 0.7–4.0)
MCH: 27.5 pg (ref 26.0–34.0)
MCHC: 33 g/dL (ref 30.0–36.0)
MCV: 83.5 fL (ref 80.0–100.0)
Monocytes Absolute: 0.4 K/uL (ref 0.1–1.0)
Monocytes Relative: 7 %
Neutro Abs: 2.4 K/uL (ref 1.7–7.7)
Neutrophils Relative %: 43 %
Platelet Count: 257 K/uL (ref 150–400)
RBC: 4.72 MIL/uL (ref 4.22–5.81)
RDW: 13.2 % (ref 11.5–15.5)
WBC Count: 5.6 K/uL (ref 4.0–10.5)
nRBC: 0 % (ref 0.0–0.2)

## 2024-06-03 LAB — CMP (CANCER CENTER ONLY)
ALT: 22 U/L (ref 0–44)
AST: 22 U/L (ref 15–41)
Albumin: 4.1 g/dL (ref 3.5–5.0)
Alkaline Phosphatase: 67 U/L (ref 38–126)
Anion gap: 12 (ref 5–15)
BUN: 12 mg/dL (ref 6–20)
CO2: 23 mmol/L (ref 22–32)
Calcium: 8.9 mg/dL (ref 8.9–10.3)
Chloride: 105 mmol/L (ref 98–111)
Creatinine: 1.14 mg/dL (ref 0.61–1.24)
GFR, Estimated: >60 mL/min
Glucose, Bld: 105 mg/dL — ABNORMAL HIGH (ref 70–99)
Potassium: 4 mmol/L (ref 3.5–5.1)
Sodium: 139 mmol/L (ref 135–145)
Total Bilirubin: 0.4 mg/dL (ref 0.0–1.2)
Total Protein: 6.8 g/dL (ref 6.5–8.1)

## 2024-06-03 MED ORDER — ACETAMINOPHEN 325 MG PO TABS
650.0000 mg | ORAL_TABLET | Freq: Once | ORAL | Status: AC
  Administered 2024-06-03: 650 mg via ORAL
  Filled 2024-06-03: qty 2

## 2024-06-03 MED ORDER — DIPHENHYDRAMINE HCL 25 MG PO CAPS
50.0000 mg | ORAL_CAPSULE | Freq: Once | ORAL | Status: AC
  Administered 2024-06-03: 50 mg via ORAL
  Filled 2024-06-03: qty 2

## 2024-06-03 MED ORDER — FAMOTIDINE 20 MG PO TABS
20.0000 mg | ORAL_TABLET | Freq: Once | ORAL | Status: AC
  Administered 2024-06-03: 20 mg via ORAL
  Filled 2024-06-03: qty 1

## 2024-06-03 MED ORDER — DEXAMETHASONE 6 MG PO TABS
40.0000 mg | ORAL_TABLET | Freq: Once | ORAL | Status: AC
  Administered 2024-06-03: 40 mg via ORAL
  Filled 2024-06-03: qty 1

## 2024-06-03 MED ORDER — DARATUMUMAB-HYALURONIDASE-FIHJ 1800-30000 MG-UT/15ML ~~LOC~~ SOLN
1800.0000 mg | Freq: Once | SUBCUTANEOUS | Status: AC
  Administered 2024-06-03: 1800 mg via SUBCUTANEOUS
  Filled 2024-06-03: qty 15

## 2024-06-03 NOTE — Patient Instructions (Signed)

## 2024-06-03 NOTE — Progress Notes (Unsigned)
 " [facility] [phone]   [phone]  PROGRESS NOTE  Patient Care Team: Tanda Bleacher, MD as PCP - General (Family Medicine) Court Dorn PARAS, MD as PCP - Cardiology (Cardiology) Shaaron Lamar HERO, MD as Attending Physician (Gastroenterology)  Hematological/Oncological History # IgG Kappa Multiple Myeloma, 1p32/1q21.  01/04/2022: MRI lumbar spine shows diffuse heterogeneous marrow signal with heterogeneous enhancement throughout the thoracolumbar spine.  Additionally, there is subacute-chronic L3 vertebral body compression fraction. 01/14/2022: establish care with Dr. Federico  02/10/2022: bone marrow biopsy shows range of plasma cell involvement from 50% on the biopsy to 60% on aspirate smear slides and close to 90% on the clot section for an overall percentage estimated at approximately 80% overall. 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  10/02/2022: Cycle 1 Day 1 of Dara/Rev/Dex 11/03/2022: Cycle 2 Day 1 of Dara/Rev/Dex 11/30/2022:  Cycle 3 Day 1 of Dara/Rev/Dex 12/29/2022: Cycle 4 Day 1 of Dara/Rev/Dex 01/26/2023: Cycle 5 Day 1 of Dara/Rev/Dex 02/23/2023: Cycle 6 Day 1 of Dara/Rev/Dex 03/23/2023: Cycle 7 Day 1 of Dara/Rev/Dex 04/20/2023: Cycle 8 Day 1 of Dara/Rev/Dex 05/19/2023: Cycle 9 Day 1 of Dara/Rev/Dex 1/11-1/17/2025: mobilization and stem cell collection  06/15/2023: Cycle 10 Day 1 of Dara/Rev/Dex 07/14/2023: Cycle 11 Day 1 of  Dara/Rev/Dex 08/04/2023: Underwent autologous stem cell transplant at [facility] on 08/04/2023.  03/10/2024: Started Dara/Revlimid  therapy  Interval History:  [name] [name] 54 y.o. male for continued management of IgG kappa multiple myeloma. The patient's last visit was on 05/04/2024 and in the interim, he denies any changes to his health.   On exam today [name] [name] reports he has been well overall in interim since our last visit though he has developed a raspy voice due to an upper respiratory infection.  He reports he is having cough and congestion but no actual sore throat.  He reports he has had no fevers, chills, sweats and was tested negative for flu and COVID.  He reports he sounds worse and he feels.  He is doing gargling and warm try to help with this.  He reports otherwise he is doing well with good energy and good appetite.  He is eating 2-3 meals per day.  He reports that he feels like his blood pressure elevated today because he had some salty ham last night.  Otherwise he is well and has no questions concerns or complaints.  He is willing and able to continue on Dara rev maintenance therapy at this time.  MEDICAL HISTORY:  Past Medical History:  Diagnosis Date   Allergy    Anemia    Anxiety    Atypical chest pain    Cancer (HCC)    Multiple myeloma with normocytic anemia   Depression    GERD (gastroesophageal reflux disease)    H/O transfusion of platelets    Hepatic cyst    Benign by MRI   Hiatal hernia    History of autologous stem cell transplant (HCC)  07/2023   History of colonic polyps    Hyperlipidemia    Hypertension    Multiple myeloma (HCC)    Pre-diabetes    Rectal bleeding    Restless leg    Tobacco abuse     SURGICAL HISTORY: Past Surgical History:  Procedure Laterality Date   COLONOSCOPY  01/10/2009     RMR: Normal rectum, normal colon; repeat in 2015 due to FH of colon cancer   COLONOSCOPY N/A 01/09/2014   MFM:Floupeoz colonic polyps-removed as  described   COLONOSCOPY N/A 09/18/2016   Procedure: COLONOSCOPY;  Surgeon: Lamar CHRISTELLA Hollingshead, MD;  Location: [facility];  Service: Endoscopy;  Laterality: N/A;  730    ESOPHAGOGASTRODUODENOSCOPY  10/04/2007   RMR: Distal esophageal erosions consistent with erosive reflux esophagitis, patulous gastroesophageal junction status post passage of a  Maloney dilator, 56 French.  Otherwise, unremarkable esophagus.  Hiatal hernia.  Otherwise normal stomach.  Bulbar erosion   ESOPHAGOGASTRODUODENOSCOPY N/A 01/09/2014   Erosive reflux esophagitis. Small hiatal hernia   HUMERUS IM NAIL Left 02/22/2022   Procedure: INTRAMEDULLARY (IM) NAIL HUMERAL;  Surgeon: Sharl Selinda Dover, MD;  Location: [facility] OR;  Service: Orthopedics;  Laterality: Left;   I & D EXTREMITY Left 07/26/2018   Procedure: IRRIGATION AND DEBRIDEMENT EXTREMITY;  Surgeon: Lorretta Dess, MD;  Location: [facility] OR;  Service: Plastics;  Laterality: Left;   IR BONE TUMOR(S)RF ABLATION  02/05/2022   IR BONE TUMOR(S)RF ABLATION  02/24/2022   IR KYPHO EA ADDL LEVEL THORACIC OR LUMBAR  02/19/2022   IR KYPHO LUMBAR INC FX REDUCE BONE BX UNI/BIL CANNULATION INC/IMAGING  02/05/2022   IR KYPHO LUMBAR INC FX REDUCE BONE BX UNI/BIL CANNULATION INC/IMAGING  02/19/2022   LAPAROSCOPIC APPENDECTOMY N/A 02/09/2024   Procedure: APPENDECTOMY, LAPAROSCOPIC;  Surgeon: Eletha Boas, MD;  Location: [facility] ORS;  Service: General;  Laterality: N/A;   PERCUTANEOUS PINNING Left 07/26/2018   Procedure: PERCUTANEOUS PINNING EXTREMITY;  Surgeon: Lorretta Dess, MD;  Location: [facility] OR;  Service: Plastics;  Laterality: Left;    SOCIAL HISTORY: Social History   Socioeconomic History   Marital status: Divorced    Spouse name: Not on file   Number of children: 2   Years of education: Not on file   Highest education level: Not on file  Occupational History   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[employer]    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[employer]  Tobacco Use   Smoking status:  Former    Current packs/day: 0.00    Average packs/day: 0.3 packs/day for 10.0 years (2.5 ttl pk-yrs)    Types: Cigarettes    Start date: 12/25/2011    Quit date: 12/24/2021    Years since quitting: 2.4   Smokeless tobacco: Never  Vaping Use   Vaping status: Never Used  Substance and Sexual Activity   Alcohol use: Yes    Alcohol/week: 0.0 standard drinks of alcohol    Comment: occasional/wine on the weekends   Drug use: No   Sexual activity: Not on file  Other Topics Concern   Not on file  Social History Narrative   Right handed   Lives alone one story home   Social Drivers of Health   Tobacco Use: Medium Risk (04/07/2024)   Patient History    Smoking Tobacco Use: Former    Smokeless Tobacco Use: Never    Passive Exposure: Not on file  Financial Resource Strain: Low Risk (02/03/2024)   [facility]   Overall Financial Resource Strain (  CARDIA)    How hard is it for you to pay for the very basics like food, housing, medical care, and heating?: Not very hard  Food Insecurity: Food Insecurity Present (02/03/2024)   Received from [facility]   Epic    Within the past 12 months, you worried that your food would run out before you got the money to buy more.: Sometimes true    Within the past 12 months, the food you bought just didn't last and you didn't have money to get more.: Sometimes true  Transportation Needs: Unmet Transportation Needs (02/03/2024)   Received from [facility]    In the past 12 months, has lack of transportation kept you from medical appointments or from getting medications?: Yes    In the past 12 months, has lack of transportation kept you from meetings, work, or from getting things needed for daily living?: Yes  Physical Activity: Inactive (02/13/2023)   Exercise Vital Sign    Days of Exercise per Week: 0 days    Minutes of Exercise per Session: 0 min  Stress: No Stress Concern Present (02/13/2023)   [employer] of Occupational  Health - Occupational Stress Questionnaire    Feeling of Stress : Not at all  Social Connections: Unknown (02/13/2023)   Social Connection and Isolation Panel    Frequency of Communication with Friends and Family: More than three times a week    Frequency of Social Gatherings with Friends and Family: More than three times a week    Attends Religious Services: More than 4 times per year    Active Member of [employer] or Organizations: No    Attends Banker Meetings: Never    Marital Status: Not on file  Intimate Partner Violence: Not At Risk (07/17/2022)   Humiliation, Afraid, Rape, and Kick questionnaire    Fear of Current or Ex-Partner: No    Emotionally Abused: No    Physically Abused: No    Sexually Abused: No  Depression (PHQ2-9): Low Risk (06/03/2024)   Depression (PHQ2-9)    PHQ-2 Score: 0  Alcohol Screen: Low Risk (02/13/2023)   Alcohol Screen    Last Alcohol Screening Score (AUDIT): 0  Housing: Low Risk (02/03/2024)   Received from [facility]    In the last 12 months, was there a time when you were not able to pay the mortgage or rent on time?: No    In the past 12 months, how many times have you moved where you were living?: 1    At any time in the past 12 months, were you homeless or living in a shelter (including now)?: No  Utilities: Not At Risk (02/03/2024)   [facility] from [facility]    In the past 12 months has the electric, gas, oil, or water  company threatened to shut off services in your home?: No  Health Literacy: Adequate Health Literacy (02/13/2023)   B1300 Health Literacy    Frequency of need for help with medical instructions: Never    FAMILY HISTORY: Family History  Problem Relation Age of Onset   Heart disease Mother    Hypertension Mother    Diabetes Father    Hypertension Father    Colon cancer Sister        passed away from colon cancer, in her 28s   Heart attack Maternal Uncle    Prostate cancer Maternal Uncle    Diabetes  Other    Pancreatic  cancer Neg Hx    Rectal cancer Neg Hx    Esophageal cancer Neg Hx    Stomach cancer Neg Hx     ALLERGIES:  has no known allergies.  MEDICATIONS:  Current Outpatient Medications  Medication Sig Dispense Refill   acetaminophen  (TYLENOL ) 500 MG tablet Take 1,000 mg by mouth every 8 (eight) hours as needed for mild pain (pain score 1-3) or moderate pain (pain score 4-6). (Patient not taking: Reported on 03/03/2024)     acyclovir  (ZOVIRAX ) 400 MG tablet Take 1 tablet (400 mg total) by mouth 2 (two) times daily. 60 tablet 0   aspirin  EC 81 MG tablet Take 81 mg by mouth daily. Swallow whole.     cetirizine  (ZYRTEC ) 10 MG tablet Take 10 mg by mouth daily as needed for allergies. (Patient not taking: Reported on 03/03/2024)     diazepam  (VALIUM ) 2 MG tablet Take 1 tablet (2 mg total) by mouth every 8 (eight) hours as needed for muscle spasms. (Patient not taking: Reported on 03/03/2024) 5 tablet 0   docusate sodium  (COLACE) 100 MG capsule Take 1 capsule (100 mg total) by mouth 2 (two) times daily as needed for mild constipation. (Patient not taking: Reported on 03/03/2024) 30 capsule 3   DULoxetine  (CYMBALTA ) 20 MG capsule Take 1 capsule by mouth once daily (Patient not taking: Reported on 03/03/2024) 90 capsule 0   gabapentin  (NEURONTIN ) 100 MG capsule Take 1 capsule (100 mg total) by mouth at bedtime. 60 capsule 2   lenalidomide  (REVLIMID ) 10 MG capsule Take 1 capsule (10 mg total) by mouth daily. [employer] Auth # [id number]     Date Obtained 05/17/2024 TAKE 1 CAPSULE BY MOUTH 1 TIME A DAY FOR 21 DAYS ON THEN 7 DAYS OFF 21 capsule 0   lidocaine  (LIDODERM ) 5 % Place 1 patch onto the skin daily. Remove & Discard patch within 12 hours or as directed by MD (Patient not taking: Reported on 02/05/2024) 14 patch 0   lisinopril  (ZESTRIL ) 10 MG tablet Take 1 tablet (10 mg total) by mouth daily. 90 tablet 0   lisinopril  (ZESTRIL ) 40 MG tablet Take 1 tablet by mouth once daily (Patient not taking:  Reported on 05/04/2024) 90 tablet 0   Multiple Vitamin (MULTIVITAMIN) tablet Take 1 tablet by mouth daily. (Patient not taking: Reported on 05/04/2024)     ondansetron  (ZOFRAN ) 8 MG tablet Take 1 tablet (8 mg total) by mouth every 8 (eight) hours as needed for nausea or vomiting. 60 tablet 3   oxyCODONE  (OXY IR/ROXICODONE ) 5 MG immediate release tablet Take 1-2 tablets (5-10 mg total) by mouth every 6 (six) hours as needed for moderate pain (pain score 4-6). (Patient not taking: Reported on 03/03/2024) 20 tablet 0   pantoprazole  (PROTONIX ) 40 MG tablet TAKE 1 TABLET BY MOUTH TWICE DAILY 60 tablet 6   polyethylene glycol powder (GLYCOLAX /MIRALAX ) 17 GM/SCOOP powder Take 1 capful (17 g) with water  by mouth daily. (Patient taking differently: Take 17 g by mouth daily as needed for mild constipation or moderate constipation.) 238 g 0   rosuvastatin  (CRESTOR ) 10 MG tablet Take 1 tablet by mouth once daily (Patient not taking: Reported on 02/05/2024) 90 tablet 0   sildenafil  (VIAGRA ) 50 MG tablet Take 1 tablet (50 mg total) by mouth daily as needed for erectile dysfunction. 30 tablet 0   No current facility-administered medications for this visit.    REVIEW OF SYSTEMS:   Constitutional: ( - ) fevers, ( - )  chills , ( - )  night sweats Eyes: ( - ) blurriness of vision, ( - ) double vision, ( - ) watery eyes Ears, nose, mouth, throat, and face: ( - ) mucositis, ( - ) sore throat Respiratory: ( - ) cough, ( - ) dyspnea, ( - ) wheezes Cardiovascular: ( - ) palpitation, ( - ) chest discomfort, ( - ) lower extremity swelling Gastrointestinal:  ( - ) nausea, ( -) heartburn, ( - ) change in bowel habits Skin: ( - ) abnormal skin rashes Lymphatics: ( - ) new lymphadenopathy, ( - ) easy bruising Neurological: ( - ) numbness, ( - ) tingling, ( - ) new weaknesses Behavioral/Psych: ( - ) mood change, ( - ) new changes  All other systems were reviewed with the patient and are negative.  PHYSICAL  EXAMINATION: ECOG PERFORMANCE STATUS: 1 - Symptomatic but completely ambulatory  Vitals:   06/03/24 0838 06/03/24 0839  BP: (!) 150/89 (!) 149/93  Pulse: 82   Resp: 18   Temp: 97.9 F (36.6 C)   SpO2: 99%       Filed Weights   06/03/24 0838  Weight: 244 lb (110.7 kg)      GENERAL: Well-appearing young African-American male, alert, no distress and comfortable SKIN: skin color, texture, turgor are normal, no rashes or significant lesions EYES: conjunctiva are pink and non-injected, sclera clear. LUNGS: clear to auscultation and percussion with normal breathing effort HEART: regular rate & rhythm and no murmurs and no lower extremity edema Musculoskeletal: no cyanosis of digits and no clubbing  PSYCH: alert & oriented x 3, fluent speech NEURO: no focal motor/sensory deficits  LABORATORY DATA:  I have reviewed the data as listed    Latest Ref Rng & Units 06/03/2024    7:50 AM 05/04/2024   11:02 AM 04/06/2024    9:14 AM  CBC  WBC 4.0 - 10.5 K/uL 5.6  5.1  4.5   Hemoglobin 13.0 - 17.0 g/dL 86.9  86.6  86.4   Hematocrit 39.0 - 52.0 % 39.4  40.9  41.0   Platelets 150 - 400 K/uL 257  190  214        Latest Ref Rng & Units 06/03/2024    7:50 AM 05/04/2024   11:02 AM 04/06/2024    9:14 AM  CMP  Glucose 70 - 99 mg/dL 894  885  94   BUN 6 - 20 mg/dL 12  7  11    Creatinine 0.61 - 1.24 mg/dL 8.85  8.83  8.89   Sodium 135 - 145 mmol/L 139  138  137   Potassium 3.5 - 5.1 mmol/L 4.0  4.0  4.1   Chloride 98 - 111 mmol/L 105  103  103   CO2 22 - 32 mmol/L 23  25  27    Calcium  8.9 - 10.3 mg/dL 8.9  9.0  9.1   Total Protein 6.5 - 8.1 g/dL 6.8  6.6  7.0   Total Bilirubin 0.0 - 1.2 mg/dL 0.4  0.4  0.4   Alkaline Phos 38 - 126 U/L 67  82  91   AST 15 - 41 U/L 22  21  17    ALT 0 - 44 U/L 22  17  13      Lab Results  Component Value Date   MPROTEIN Not Observed 05/04/2024   MPROTEIN 0.3 (H) 03/10/2024   MPROTEIN 0.2 (H) 03/02/2024   Lab Results  Component Value Date    KPAFRELGTCHN 6.1 05/04/2024   KPAFRELGTCHN 11.4 03/10/2024  KPAFRELGTCHN 14.3 03/02/2024   LAMBDASER 3.2 (L) 05/04/2024   LAMBDASER 3.9 (L) 03/10/2024   LAMBDASER 5.6 (L) 03/02/2024   KAPLAMBRATIO 1.91 (H) 05/04/2024   KAPLAMBRATIO 2.92 (H) 03/10/2024   KAPLAMBRATIO 2.55 (H) 03/02/2024   RADIOGRAPHIC STUDIES: No results found.  ASSESSMENT & PLAN [name] [name] 54 y.o. male with medical history significant for IgG kappa multiple myeloma who presents for a follow up visit.  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 -- Recommend VRD chemotherapy with intention of proceeding to transplant. -- Labs at each visit to include CBC, CMP, LDH with monthly restaging labs SPEP and serum free light chains -- Started VRD therapy on 02/26/2022. Held Velcade  therapy on 09/24/2022 due to development of ocular toxicity. --Switched to Dara/Rev/Dex on 10/02/2022. Last dose on 07/13/2023. --Underwent bone marrow [facility] at [facility] on 08/04/2023.  --Last bone marrow biopsy from 11/16/2023 demonstrates a normocellular marrow with no increase in plasma cells (2%), confirmed by CD138 immunohistochemistry. There is no morphologic evidence of involvement by plasma cell neoplasm  --Recommended maintenance therapy with daratumumab  plus Revlimid  --Plan to resume Xgeva  therapy till November 2025 to complete 2 years of therapy.  PLAN: --Resumed Dara/Revlimid  on 03/10/2024.  --Labs today show WBC 5.6, Hgb 13.0, MCV 83.5, Plt 257. creatinine and LFTs are normal. --Most recent myeloma labs from 05/04/2024 showed undetectable M protein.  --Proceed with treatment today without any dose modifications. --RTC in 4 weeks with labs and follow up before next dose of Darzalex .   #Erectile Dysfunction -- Likely secondary to chemotherapy. -- Refilled Viagra  50 mg p.o. as needed x 30  pills.  --Patient notes he is not getting the results he would like, encouraged him to double the dose to Viagra  100 mg p.o. to see if that is more effective. -- If ineffective could consider Cialis.  # H/O Leg Discomfort/Restless Leg -- Patient provided with Requip  by the emergency department, discontinued due to poor efficacy. --Evaluated by Dr. Buckley on 07/28/2022. Recommended to short course of steroids so started prednisone  50 mg daily x 7 days. --Discussed etiologies including iron deficiency so iron levels were checked and was normal.   #Pathologic fractures-secondary to MM: --Involving L1, L2 and L3 compression fracture and left humerus fracture.  --Underwent kyphoplasty of L3 fracture on 02/19/2022 --Underwent medullary nailing of left humeral shaft on 02/22/2022 --Underwent kyphoplasty on 06/04/2022.   #Pain Medication -- Per palliative care patient has weaned off MS contin . Currently conitnues on gabapentin  100 mg BID as needed --Under the care of palliative care team.   #Supportive Care -- chemotherapy education complete -- port placement not required -- zofran  8mg  q8H PRN and compazine  10mg  PO q6H for nausea -- acyclovir  400mg  PO BID for VCZ prophylaxis. Sent refill today   Orders Placed This Encounter  Procedures   IFE, Dara-Specific, Serum    Standing Status:   Future    Expected Date:   07/27/2024    Expiration Date:   07/27/2025   CBC with Differential (Cancer Center Only)    Standing Status:   Future    Expected Date:   07/27/2024    Expiration Date:   07/27/2025   Multiple Myeloma Panel (SPEP&IFE w/QIG)    Standing Status:   Future    Expected Date:   07/27/2024    Expiration Date:   07/27/2025   Kappa/lambda light chains  Standing Status:   Future    Expected Date:   07/27/2024    Expiration Date:   07/27/2025   CMP (Cancer Center only)    Standing Status:   Future    Expected Date:   07/27/2024    Expiration Date:   07/27/2025   IFE, Dara-Specific, Serum    Standing  Status:   Future    Expected Date:   08/24/2024    Expiration Date:   08/24/2025   CBC with Differential (Cancer Center Only)    Standing Status:   Future    Expected Date:   08/24/2024    Expiration Date:   08/24/2025   Multiple Myeloma Panel (SPEP&IFE w/QIG)    Standing Status:   Future    Expected Date:   08/24/2024    Expiration Date:   08/24/2025   Kappa/lambda light chains    Standing Status:   Future    Expected Date:   08/24/2024    Expiration Date:   08/24/2025   CMP (Cancer Center only)    Standing Status:   Future    Expected Date:   08/24/2024    Expiration Date:   08/24/2025    All questions were answered. The patient knows to call the clinic with any problems, questions or concerns.  I have spent a total of 30 minutes minutes of face-to-face and non-face-to-face time, preparing to see the patient,performing a medically appropriate examination, counseling and educating the patient, documenting clinical information in the electronic health record,  and care coordination.  Norleen IVAR Kidney, MD Department of Hematology/Oncology [facility] at [facility] Phone: [phone] Pager: [phone] Email: [email or web address]     "

## 2024-06-05 ENCOUNTER — Encounter: Payer: Self-pay | Admitting: Hematology

## 2024-06-05 ENCOUNTER — Encounter: Payer: Self-pay | Admitting: Hematology and Oncology

## 2024-06-05 MED ORDER — GABAPENTIN 100 MG PO CAPS: 100.0000 mg | ORAL_CAPSULE | Freq: Every day | ORAL | 2 refills | Status: AC

## 2024-06-05 MED ORDER — SILDENAFIL CITRATE 50 MG PO TABS: 50.0000 mg | ORAL_TABLET | Freq: Every day | ORAL | 0 refills | Status: AC | PRN

## 2024-06-06 ENCOUNTER — Ambulatory Visit: Admitting: Family Medicine

## 2024-06-06 ENCOUNTER — Inpatient Hospital Stay: Admitting: Hematology and Oncology

## 2024-06-06 ENCOUNTER — Inpatient Hospital Stay

## 2024-06-06 LAB — KAPPA/LAMBDA LIGHT CHAINS
Kappa free light chain: 4.5 mg/L (ref 3.3–19.4)
Kappa, lambda light chain ratio: 1.73 — ABNORMAL HIGH (ref 0.26–1.65)
Lambda free light chains: 2.6 mg/L — ABNORMAL LOW (ref 5.7–26.3)

## 2024-06-08 LAB — MULTIPLE MYELOMA PANEL, SERUM
Albumin SerPl Elph-Mcnc: 3.4 g/dL (ref 2.9–4.4)
Albumin/Glob SerPl: 1.3 (ref 0.7–1.7)
Alpha 1: 0.3 g/dL (ref 0.0–0.4)
Alpha2 Glob SerPl Elph-Mcnc: 1 g/dL (ref 0.4–1.0)
B-Globulin SerPl Elph-Mcnc: 1 g/dL (ref 0.7–1.3)
Gamma Glob SerPl Elph-Mcnc: 0.4 g/dL (ref 0.4–1.8)
Globulin, Total: 2.7 g/dL (ref 2.2–3.9)
IgA: 17 mg/dL — ABNORMAL LOW (ref 90–386)
IgG (Immunoglobin G), Serum: 540 mg/dL — ABNORMAL LOW (ref 603–1613)
IgM (Immunoglobulin M), Srm: 22 mg/dL (ref 20–172)
Total Protein ELP: 6.1 g/dL (ref 6.0–8.5)

## 2024-06-11 ENCOUNTER — Other Ambulatory Visit: Payer: Self-pay | Admitting: Physician Assistant

## 2024-06-12 LAB — IFE, DARA-SPECIFIC, SERUM
IgA: 17 mg/dL — ABNORMAL LOW (ref 90–386)
IgG (Immunoglobin G), Serum: 495 mg/dL — ABNORMAL LOW (ref 603–1613)
IgM (Immunoglobulin M), Srm: 22 mg/dL (ref 20–172)

## 2024-06-14 ENCOUNTER — Encounter: Payer: Self-pay | Admitting: Hematology

## 2024-06-14 ENCOUNTER — Encounter: Payer: Self-pay | Admitting: Hematology and Oncology

## 2024-06-15 ENCOUNTER — Other Ambulatory Visit: Payer: Self-pay | Admitting: Hematology and Oncology

## 2024-06-16 ENCOUNTER — Encounter (INDEPENDENT_AMBULATORY_CARE_PROVIDER_SITE_OTHER): Payer: Self-pay | Admitting: Primary Care

## 2024-06-16 ENCOUNTER — Telehealth (INDEPENDENT_AMBULATORY_CARE_PROVIDER_SITE_OTHER): Payer: Self-pay | Admitting: Primary Care

## 2024-06-16 DIAGNOSIS — R499 Unspecified voice and resonance disorder: Secondary | ICD-10-CM | POA: Diagnosis not present

## 2024-06-16 NOTE — Progress Notes (Signed)
 " [facility]  Virtual Visit Note  I connected with [name], on 06/16/2024 at 8:54 AM through an audio and video application and verified that I am speaking with the correct person using two identifiers.   Consent: I discussed the limitations, risks, security and privacy concerns of performing an evaluation and management service by mychart and the availability of in person appointments. I also discussed with the patient that there may be a patient responsible charge related to this service. The patient expressed understanding and agreed to proceed.   Location of Patient: Home   Location of Provider: [facility]   Persons participating in visit: Particia JINNY Metro Rosaline Celestia,  NP   History of Present Illness: [name] is 54 year old male having an acute virtual visit .Seen approximately 3 weeks in Urgent care for sore throat, hoarseness, felt he had a flu , productive cough. Still has hoarseness and would like to be referred to ENT   Past Medical History:  Diagnosis Date   Allergy    Anemia    Anxiety    Atypical chest pain    Cancer (HCC)    Multiple myeloma with normocytic anemia   Depression    GERD (gastroesophageal reflux disease)    H/O transfusion of platelets    Hepatic cyst    Benign by MRI   Hiatal hernia    History of autologous stem cell transplant (HCC) 07/2023   History of colonic polyps    Hyperlipidemia    Hypertension    Multiple myeloma (HCC)    Pre-diabetes    Rectal bleeding    Restless leg    Tobacco abuse    Allergies[1]  Medications Ordered Prior to Encounter[2]  Observations/Objective: See HPI  Assessment and Plan: Diagnoses and all orders for this visit:  Hoarseness or changing voice -     Ambulatory referral to ENT     Follow Up Instructions: PCP   I discussed the assessment and treatment plan with the patient. The patient was provided an  opportunity to ask questions and all were answered. The patient agreed with the plan and demonstrated an understanding of the instructions.   The patient was advised to call back or seek an in-person evaluation if the symptoms worsen or if the condition fails to improve as anticipated.     I provided 12 minutes total time during this encounter including median intraservice time, reviewing previous notes, investigations, ordering medications, medical decision making, coordinating care and patient verbalized understanding at the end of the visit.    This note has been created with Education officer, environmental. Any transcriptional errors are unintentional.   Rosaline SHAUNNA Celestia, NP 06/16/2024, 8:54 AM     [1] No Known Allergies [2]  Current Outpatient Medications on File Prior to Visit  Medication Sig Dispense Refill   acetaminophen  (TYLENOL ) 500 MG tablet Take 1,000 mg by mouth every 8 (eight) hours as needed for mild pain (pain score 1-3) or moderate pain (pain score 4-6). (Patient not taking: Reported on 03/03/2024)     acyclovir  (ZOVIRAX ) 400 MG tablet Take 1 tablet by mouth twice daily 60 tablet 0   aspirin  EC 81 MG tablet Take 81 mg by mouth daily. Swallow whole.     cetirizine  (ZYRTEC ) 10 MG tablet Take 10 mg by mouth daily as needed for allergies. (Patient not taking: Reported on 03/03/2024)     diazepam  (  VALIUM ) 2 MG tablet Take 1 tablet (2 mg total) by mouth every 8 (eight) hours as needed for muscle spasms. (Patient not taking: Reported on 03/03/2024) 5 tablet 0   docusate sodium  (COLACE) 100 MG capsule Take 1 capsule (100 mg total) by mouth 2 (two) times daily as needed for mild constipation. (Patient not taking: Reported on 03/03/2024) 30 capsule 3   DULoxetine  (CYMBALTA ) 20 MG capsule Take 1 capsule by mouth once daily (Patient not taking: Reported on 03/03/2024) 90 capsule 0   gabapentin  (NEURONTIN ) 100 MG capsule Take 1 capsule (100 mg total) by  mouth at bedtime. 60 capsule 2   lenalidomide  (REVLIMID ) 10 MG capsule Take 1 capsule (10 mg total) by mouth daily. [employer] Auth # [id number]     Date Obtained 05/17/2024 TAKE 1 CAPSULE BY MOUTH 1 TIME A DAY FOR 21 DAYS ON THEN 7 DAYS OFF 21 capsule 0   lidocaine  (LIDODERM ) 5 % Place 1 patch onto the skin daily. Remove & Discard patch within 12 hours or as directed by MD (Patient not taking: Reported on 02/05/2024) 14 patch 0   lisinopril  (ZESTRIL ) 10 MG tablet Take 1 tablet (10 mg total) by mouth daily. 90 tablet 0   lisinopril  (ZESTRIL ) 40 MG tablet Take 1 tablet by mouth once daily (Patient not taking: Reported on 05/04/2024) 90 tablet 0   Multiple Vitamin (MULTIVITAMIN) tablet Take 1 tablet by mouth daily. (Patient not taking: Reported on 05/04/2024)     ondansetron  (ZOFRAN ) 8 MG tablet TAKE 1 TABLET BY MOUTH EVERY 8 HOURS AS NEEDED FOR NAUSEA FOR VOMITING 60 tablet 0   oxyCODONE  (OXY IR/ROXICODONE ) 5 MG immediate release tablet Take 1-2 tablets (5-10 mg total) by mouth every 6 (six) hours as needed for moderate pain (pain score 4-6). (Patient not taking: Reported on 03/03/2024) 20 tablet 0   pantoprazole  (PROTONIX ) 40 MG tablet TAKE 1 TABLET BY MOUTH TWICE DAILY 60 tablet 6   polyethylene glycol powder (GLYCOLAX /MIRALAX ) 17 GM/SCOOP powder Take 1 capful (17 g) with water  by mouth daily. (Patient taking differently: Take 17 g by mouth daily as needed for mild constipation or moderate constipation.) 238 g 0   rosuvastatin  (CRESTOR ) 10 MG tablet Take 1 tablet by mouth once daily (Patient not taking: Reported on 02/05/2024) 90 tablet 0   sildenafil  (VIAGRA ) 50 MG tablet Take 1 tablet (50 mg total) by mouth daily as needed for erectile dysfunction. 30 tablet 0   No current facility-administered medications on file prior to visit.   "

## 2024-06-17 ENCOUNTER — Other Ambulatory Visit: Payer: Self-pay | Admitting: Hematology and Oncology

## 2024-06-22 ENCOUNTER — Ambulatory Visit (INDEPENDENT_AMBULATORY_CARE_PROVIDER_SITE_OTHER)

## 2024-06-22 ENCOUNTER — Encounter: Payer: Self-pay | Admitting: Hematology and Oncology

## 2024-06-22 ENCOUNTER — Encounter: Payer: Self-pay | Admitting: Hematology

## 2024-06-22 ENCOUNTER — Encounter (HOSPITAL_COMMUNITY): Payer: Self-pay

## 2024-06-22 ENCOUNTER — Ambulatory Visit (HOSPITAL_COMMUNITY)
Admission: EM | Admit: 2024-06-22 | Discharge: 2024-06-22 | Disposition: A | Discharge status: Home / Self Care | Attending: Family Medicine | Admitting: Family Medicine

## 2024-06-22 DIAGNOSIS — H9202 Otalgia, left ear: Secondary | ICD-10-CM

## 2024-06-22 DIAGNOSIS — R053 Chronic cough: Secondary | ICD-10-CM

## 2024-06-22 DIAGNOSIS — J029 Acute pharyngitis, unspecified: Secondary | ICD-10-CM

## 2024-06-22 MED ORDER — AMOXICILLIN 875 MG PO TABS: 875.0000 mg | ORAL_TABLET | Freq: Two times a day (BID) | ORAL | 0 refills | Status: AC

## 2024-06-22 MED ORDER — FAMOTIDINE 40 MG PO TABS: 40.0000 mg | ORAL_TABLET | Freq: Every day | ORAL | 2 refills | Status: AC

## 2024-06-22 NOTE — ED Triage Notes (Signed)
 Pt states sore throat,cough and left ear pain for the past month. States he was seen in the ED for the same and was told it was a virus.
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
## 2024-06-23 NOTE — ED Provider Notes (Signed)
 " [facility]   [id number] 06/22/24 Arrival Time: 1234  ASSESSMENT & PLAN:  1. Sore throat   2. Persistent cough for 3 weeks or longer   3. Otalgia of left ear    I have personally viewed and independently interpreted the imaging studies ordered this visit. CXR: no acute changes.  No signs of peritonsillar abscess.  Suspect L OM developing.  Meds ordered this encounter  Medications   famotidine  (PEPCID ) 40 MG tablet    Sig: Take 1 tablet (40 mg total) by mouth daily.    Dispense:  30 tablet    Refill:  2   amoxicillin  (AMOXIL ) 875 MG tablet    Sig: Take 1 tablet (875 mg total) by mouth 2 (two) times daily for 7 days.    Dispense:  14 tablet    Refill:  0    Reviewed expectations re: course of current medical issues. Questions answered. Outlined signs and symptoms indicating need for more acute intervention. Patient verbalized understanding. After Visit Summary given.   SUBJECTIVE:  [name] is a 54 y.o. male who reports a sore throat and persistent cough x several weeks. Pt states sore throat,cough and left ear pain for the past month. States he was seen in the ED for the same and was told it was a virus. Denies fever. L ear hurting more past few days.   OBJECTIVE:  Vitals:   06/22/24 1338  BP: (!) 147/90  Pulse: (!) 105  Resp: 16  Temp: 98.2 F (36.8 C)  TempSrc: Oral  SpO2: 98%    General appearance: alert; no distress HEENT: throat with mild erythema and cobblestoning; uvula is midline; L ear with erythema/bulging Neck: supple with FROM; no lymphadenopathy Lungs: speaks full sentences without difficulty; unlabored Abd: soft; non-tender Skin: reveals no rash; warm and dry Psychological: alert and cooperative; normal mood and affect  Allergies[1]  Past Medical History:  Diagnosis Date   Allergy    Anemia    Anxiety    Atypical chest pain    Cancer (HCC)    Multiple myeloma with normocytic anemia   Depression    GERD  (gastroesophageal reflux disease)    H/O transfusion of platelets    Hepatic cyst    Benign by MRI   Hiatal hernia    History of autologous stem cell transplant (HCC) 07/2023   History of colonic polyps    Hyperlipidemia    Hypertension    Multiple myeloma (HCC)    Pre-diabetes    Rectal bleeding    Restless leg    Tobacco abuse    Social History   Socioeconomic History   Marital status: Divorced    Spouse name: Not on file   Number of children: 2   Years of education: Not on file   Highest education level: Not on file  Occupational History   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[employer]    Comment: [employer] in [employer]  Tobacco Use   Smoking status: Former    Current packs/day: 0.00    Average packs/day: 0.3 packs/day for 10.0 years (2.5 ttl pk-yrs)    Types: Cigarettes    Start date: 12/25/2011    Quit date: 12/24/2021    Years since quitting: 2.4   Smokeless tobacco: Never  Vaping Use   Vaping status: Never Used  Substance and Sexual Activity   Alcohol use: Yes    Alcohol/week: 0.0 standard drinks of alcohol    Comment: occasional/wine on  the weekends   Drug use: No   Sexual activity: Not on file  Other Topics Concern   Not on file  Social History Narrative   Right handed   Lives alone one story home   Social Drivers of Health   Tobacco Use: Medium Risk (06/22/2024)   Patient History    Smoking Tobacco Use: Former    Smokeless Tobacco Use: Never    Passive Exposure: Not on file  Financial Resource Strain: Low Risk (02/03/2024)   Received from [facility]   Overall Financial Resource Strain (CARDIA)    How hard is it for you to pay for the very basics like food, housing, medical care, and heating?: Not very hard  Food Insecurity: Low Risk (06/22/2024)   Received from [facility]   Epic    Within the past 12 months, you worried that your food would run out before you got money to buy more: Never true    Within the past 12 months, the  food you bought just didn't last and you didn't have money to get more. : Never true  Transportation Needs: No Transportation Needs (06/22/2024)   Received from [facility]    In the past 12 months, has lack of reliable transportation kept you from medical appointments, meetings, work or from getting things needed for daily living? : No  Physical Activity: Inactive (02/13/2023)   Exercise Vital Sign    Days of Exercise per Week: 0 days    Minutes of Exercise per Session: 0 min  Stress: No Stress Concern Present (02/13/2023)   [employer] of Occupational Health - Occupational Stress Questionnaire    Feeling of Stress : Not at all  Social Connections: Unknown (02/13/2023)   Social Connection and Isolation Panel    Frequency of Communication with Friends and Family: More than three times a week    Frequency of Social Gatherings with Friends and Family: More than three times a week    Attends Religious Services: More than 4 times per year    Active Member of [employer] or Organizations: No    Attends Banker Meetings: Never    Marital Status: Not on file  Intimate Partner Violence: Not At Risk (07/17/2022)   Humiliation, Afraid, Rape, and Kick questionnaire    Fear of Current or Ex-Partner: No    Emotionally Abused: No    Physically Abused: No    Sexually Abused: No  Depression (PHQ2-9): Low Risk (06/03/2024)   Depression (PHQ2-9)    PHQ-2 Score: 0  Alcohol Screen: Low Risk (02/13/2023)   Alcohol Screen    Last Alcohol Screening Score (AUDIT): 0  Housing: Low Risk (06/22/2024)   Received from [facility]   Epic    What is your living situation today?: I have a steady place to live    Think about the place you live. Do you have problems with any of the following? Choose all that apply:: None/None on this list  Utilities: Low Risk (06/22/2024)   Received from [facility]   Utilities    In the past 12 months has the electric, gas, oil, or water  company  threatened to shut off services in your home? : No  Health Literacy: Adequate Health Literacy (02/13/2023)   B1300 Health Literacy    Frequency of need for help with medical instructions: Never   Family History  Problem Relation Age of Onset   Heart disease Mother    Hypertension Mother    Diabetes  Father    Hypertension Father    Colon cancer Sister        passed away from colon cancer, in her 54s   Heart attack Maternal Uncle    Prostate cancer Maternal Uncle    Diabetes Other    Pancreatic cancer Neg Hx    Rectal cancer Neg Hx    Esophageal cancer Neg Hx    Stomach cancer Neg Hx             [1] No Known Allergies    Rolinda Rogue, MD 06/23/24 1258  "

## 2024-06-27 ENCOUNTER — Emergency Department (HOSPITAL_COMMUNITY)
Admission: EM | Admit: 2024-06-27 | Discharge: 2024-06-27 | Disposition: A | Discharge status: Home / Self Care | Source: Home / Self Care | Attending: Emergency Medicine | Admitting: Emergency Medicine

## 2024-06-27 ENCOUNTER — Other Ambulatory Visit (HOSPITAL_COMMUNITY): Payer: Self-pay

## 2024-06-27 ENCOUNTER — Other Ambulatory Visit: Payer: Self-pay

## 2024-06-27 DIAGNOSIS — T300 Burn of unspecified body region, unspecified degree: Secondary | ICD-10-CM

## 2024-06-27 LAB — URINALYSIS, ROUTINE W REFLEX MICROSCOPIC
Bilirubin Urine: NEGATIVE
Glucose, UA: 50 mg/dL — AB
Hgb urine dipstick: NEGATIVE
Ketones, ur: NEGATIVE mg/dL
Leukocytes,Ua: NEGATIVE
Nitrite: NEGATIVE
Protein, ur: NEGATIVE mg/dL
Specific Gravity, Urine: 1.019 (ref 1.005–1.030)
pH: 6 (ref 5.0–8.0)

## 2024-06-27 LAB — CBC
HCT: 38.7 % — ABNORMAL LOW (ref 39.0–52.0)
Hemoglobin: 11.7 g/dL — ABNORMAL LOW (ref 13.0–17.0)
MCH: 27.5 pg (ref 26.0–34.0)
MCHC: 30.2 g/dL (ref 30.0–36.0)
MCV: 90.8 fL (ref 80.0–100.0)
Platelets: 242 10*3/uL (ref 150–400)
RBC: 4.26 MIL/uL (ref 4.22–5.81)
RDW: 14.4 % (ref 11.5–15.5)
WBC: 10.5 10*3/uL (ref 4.0–10.5)
nRBC: 0 % (ref 0.0–0.2)

## 2024-06-27 LAB — COMPREHENSIVE METABOLIC PANEL WITH GFR
ALT: 18 U/L (ref 0–44)
AST: 19 U/L (ref 15–41)
Albumin: 3.8 g/dL (ref 3.5–5.0)
Alkaline Phosphatase: 70 U/L (ref 38–126)
Anion gap: 11 (ref 5–15)
BUN: 10 mg/dL (ref 6–20)
CO2: 23 mmol/L (ref 22–32)
Calcium: 9.6 mg/dL (ref 8.9–10.3)
Chloride: 104 mmol/L (ref 98–111)
Creatinine, Ser: 1.08 mg/dL (ref 0.61–1.24)
GFR, Estimated: >60 mL/min
Glucose, Bld: 97 mg/dL (ref 70–99)
Potassium: 4.4 mmol/L (ref 3.5–5.1)
Sodium: 139 mmol/L (ref 135–145)
Total Bilirubin: 0.3 mg/dL (ref 0.0–1.2)
Total Protein: 7 g/dL (ref 6.5–8.1)

## 2024-06-27 LAB — LIPASE, BLOOD: Lipase: 22 U/L (ref 11–51)

## 2024-06-27 MED ORDER — OXYCODONE-ACETAMINOPHEN 5-325 MG PO TABS
1.0000 | ORAL_TABLET | Freq: Four times a day (QID) | ORAL | 0 refills | Status: AC | PRN
  Filled 2024-06-27: qty 15, 4d supply, fill #0

## 2024-06-27 MED ORDER — MORPHINE SULFATE (PF) 4 MG/ML IV SOLN
4.0000 mg | Freq: Once | INTRAVENOUS | Status: AC
  Administered 2024-06-27: 4 mg via INTRAVENOUS
  Filled 2024-06-27: qty 1

## 2024-06-27 MED ORDER — ACETAMINOPHEN 500 MG PO TABS
1000.0000 mg | ORAL_TABLET | Freq: Once | ORAL | Status: AC
  Administered 2024-06-27: 1000 mg via ORAL
  Filled 2024-06-27: qty 2

## 2024-06-27 MED ORDER — OXYCODONE HCL 5 MG PO TABS: 5.0000 mg | ORAL_TABLET | Freq: Four times a day (QID) | ORAL | 0 refills | Status: AC | PRN

## 2024-06-27 MED ORDER — HYDROMORPHONE HCL 1 MG/ML IJ SOLN
1.0000 mg | Freq: Once | INTRAMUSCULAR | Status: AC
  Administered 2024-06-27: 1 mg via INTRAMUSCULAR
  Filled 2024-06-27: qty 1

## 2024-06-27 MED ORDER — HYDROMORPHONE HCL 1 MG/ML IJ SOLN
0.5000 mg | Freq: Once | INTRAMUSCULAR | Status: AC
  Administered 2024-06-27: 0.5 mg via INTRAMUSCULAR
  Filled 2024-06-27: qty 1

## 2024-06-27 MED ORDER — OXYCODONE-ACETAMINOPHEN 5-325 MG PO TABS
1.0000 | ORAL_TABLET | Freq: Once | ORAL | Status: AC
  Administered 2024-06-27: 1 via ORAL
  Filled 2024-06-27: qty 1
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
## 2024-06-29 ENCOUNTER — Inpatient Hospital Stay: Admitting: Physician Assistant

## 2024-06-29 ENCOUNTER — Inpatient Hospital Stay

## 2024-06-29 ENCOUNTER — Inpatient Hospital Stay: Admitting: Hematology and Oncology

## 2024-07-18 ENCOUNTER — Institutional Professional Consult (permissible substitution) (INDEPENDENT_AMBULATORY_CARE_PROVIDER_SITE_OTHER)

## 2024-07-19 ENCOUNTER — Ambulatory Visit: Admitting: Family Medicine

## 2024-07-27 ENCOUNTER — Inpatient Hospital Stay

## 2024-07-27 ENCOUNTER — Inpatient Hospital Stay: Admitting: Hematology and Oncology

## 2024-07-27 ENCOUNTER — Inpatient Hospital Stay: Attending: Physician Assistant
